# Patient Record
Sex: Female | Born: 1950
Health system: Southern US, Community
[De-identification: ages and names within clinical notes are randomized; demographics above are authoritative.]

## PROBLEM LIST (undated history)

## (undated) ENCOUNTER — Emergency Department (HOSPITAL_BASED_OUTPATIENT_CLINIC_OR_DEPARTMENT_OTHER): Admission: EM | Payer: Medicare Other | Source: Home / Self Care

## (undated) DIAGNOSIS — E079 Disorder of thyroid, unspecified: Secondary | ICD-10-CM

## (undated) DIAGNOSIS — F32A Depression, unspecified: Secondary | ICD-10-CM

## (undated) DIAGNOSIS — I639 Cerebral infarction, unspecified: Secondary | ICD-10-CM

## (undated) DIAGNOSIS — F329 Major depressive disorder, single episode, unspecified: Secondary | ICD-10-CM

## (undated) DIAGNOSIS — I6529 Occlusion and stenosis of unspecified carotid artery: Secondary | ICD-10-CM

## (undated) DIAGNOSIS — S82832A Other fracture of upper and lower end of left fibula, initial encounter for closed fracture: Secondary | ICD-10-CM

## (undated) DIAGNOSIS — T4145XA Adverse effect of unspecified anesthetic, initial encounter: Secondary | ICD-10-CM

## (undated) DIAGNOSIS — I10 Essential (primary) hypertension: Secondary | ICD-10-CM

## (undated) DIAGNOSIS — R519 Headache, unspecified: Secondary | ICD-10-CM

## (undated) DIAGNOSIS — R51 Headache: Secondary | ICD-10-CM

## (undated) DIAGNOSIS — S82899A Other fracture of unspecified lower leg, initial encounter for closed fracture: Secondary | ICD-10-CM

## (undated) DIAGNOSIS — K219 Gastro-esophageal reflux disease without esophagitis: Secondary | ICD-10-CM

## (undated) DIAGNOSIS — J189 Pneumonia, unspecified organism: Secondary | ICD-10-CM

## (undated) DIAGNOSIS — F419 Anxiety disorder, unspecified: Secondary | ICD-10-CM

## (undated) DIAGNOSIS — T8859XA Other complications of anesthesia, initial encounter: Secondary | ICD-10-CM

## (undated) HISTORY — DX: Anxiety disorder, unspecified: F41.9

## (undated) HISTORY — DX: Occlusion and stenosis of unspecified carotid artery: I65.29

## (undated) HISTORY — DX: Headache, unspecified: R51.9

## (undated) HISTORY — DX: Headache: R51

## (undated) HISTORY — PX: ABDOMINAL HYSTERECTOMY: SHX81

## (undated) HISTORY — DX: Major depressive disorder, single episode, unspecified: F32.9

## (undated) HISTORY — DX: Depression, unspecified: F32.A

---

## 1997-08-16 HISTORY — PX: ANTERIOR FIXATION AND POSTERIOR MICRODISCECTOMY CERVICAL SPINE: SHX1164

## 1999-02-25 ENCOUNTER — Encounter: Admission: RE | Admit: 1999-02-25 | Discharge: 1999-05-26 | Payer: Self-pay | Admitting: Endocrinology

## 1999-11-06 ENCOUNTER — Ambulatory Visit (HOSPITAL_COMMUNITY): Admission: RE | Admit: 1999-11-06 | Discharge: 1999-11-06 | Payer: Self-pay | Admitting: *Deleted

## 1999-11-10 ENCOUNTER — Ambulatory Visit (HOSPITAL_COMMUNITY): Admission: RE | Admit: 1999-11-10 | Discharge: 1999-11-10 | Payer: Self-pay | Admitting: *Deleted

## 1999-11-11 ENCOUNTER — Emergency Department (HOSPITAL_COMMUNITY): Admission: EM | Admit: 1999-11-11 | Discharge: 1999-11-11 | Payer: Self-pay | Admitting: Emergency Medicine

## 1999-11-11 ENCOUNTER — Encounter: Payer: Self-pay | Admitting: Emergency Medicine

## 2006-09-20 ENCOUNTER — Ambulatory Visit: Payer: Self-pay | Admitting: Internal Medicine

## 2006-09-21 ENCOUNTER — Ambulatory Visit: Payer: Self-pay | Admitting: *Deleted

## 2006-10-25 ENCOUNTER — Ambulatory Visit: Payer: Self-pay | Admitting: Internal Medicine

## 2006-12-16 ENCOUNTER — Ambulatory Visit: Payer: Self-pay | Admitting: Internal Medicine

## 2007-03-27 ENCOUNTER — Ambulatory Visit: Payer: Self-pay | Admitting: Internal Medicine

## 2007-05-03 ENCOUNTER — Encounter (INDEPENDENT_AMBULATORY_CARE_PROVIDER_SITE_OTHER): Payer: Self-pay | Admitting: *Deleted

## 2007-07-28 ENCOUNTER — Encounter (INDEPENDENT_AMBULATORY_CARE_PROVIDER_SITE_OTHER): Payer: Self-pay | Admitting: Family Medicine

## 2007-07-28 ENCOUNTER — Ambulatory Visit: Payer: Self-pay | Admitting: Internal Medicine

## 2007-07-28 LAB — CONVERTED CEMR LAB
ALT: 12 U/L
AST: 8 U/L
Albumin: 4.5 g/dL
Alkaline Phosphatase: 127 U/L — ABNORMAL HIGH
BUN: 13 mg/dL
CO2: 23 meq/L
Calcium: 9.5 mg/dL
Chloride: 106 meq/L
Cholesterol: 121 mg/dL
Creatinine, Ser: 1.22 mg/dL — ABNORMAL HIGH
Glucose, Bld: 216 mg/dL — ABNORMAL HIGH
HDL: 47 mg/dL
LDL Cholesterol: 47 mg/dL
Potassium: 4.8 meq/L
Sodium: 142 meq/L
TSH: 0.052 u[IU]/mL — ABNORMAL LOW
Total Bilirubin: 0.3 mg/dL
Total CHOL/HDL Ratio: 2.6
Total Protein: 7.1 g/dL
Triglycerides: 133 mg/dL
VLDL: 27 mg/dL

## 2007-08-25 ENCOUNTER — Ambulatory Visit: Payer: Self-pay | Admitting: Internal Medicine

## 2007-09-11 ENCOUNTER — Ambulatory Visit: Payer: Self-pay | Admitting: Internal Medicine

## 2007-09-11 ENCOUNTER — Encounter (INDEPENDENT_AMBULATORY_CARE_PROVIDER_SITE_OTHER): Payer: Self-pay | Admitting: Family Medicine

## 2007-09-11 LAB — CONVERTED CEMR LAB
BUN: 13 mg/dL (ref 6–23)
Basophils Absolute: 0 10*3/uL (ref 0.0–0.1)
Basophils Relative: 0 % (ref 0–1)
CO2: 24 meq/L (ref 19–32)
Calcium: 10 mg/dL (ref 8.4–10.5)
Chloride: 107 meq/L (ref 96–112)
Creatinine, Ser: 1.15 mg/dL (ref 0.40–1.20)
Eosinophils Absolute: 0.3 10*3/uL (ref 0.0–0.7)
Eosinophils Relative: 3 % (ref 0–5)
Glucose, Bld: 124 mg/dL — ABNORMAL HIGH (ref 70–99)
HCT: 38.5 % (ref 36.0–46.0)
Hemoglobin: 12.1 g/dL (ref 12.0–15.0)
Lymphocytes Relative: 43 % (ref 12–46)
Lymphs Abs: 4.3 10*3/uL — ABNORMAL HIGH (ref 0.7–4.0)
MCHC: 31.4 g/dL (ref 30.0–36.0)
MCV: 87.7 fL (ref 78.0–100.0)
Monocytes Absolute: 0.7 10*3/uL (ref 0.1–1.0)
Monocytes Relative: 7 % (ref 3–12)
Neutro Abs: 4.6 10*3/uL (ref 1.7–7.7)
Neutrophils Relative %: 47 % (ref 43–77)
Platelets: 185 10*3/uL (ref 150–400)
Potassium: 4.5 meq/L (ref 3.5–5.3)
RBC: 4.39 M/uL (ref 3.87–5.11)
RDW: 14.1 % (ref 11.5–15.5)
Sodium: 145 meq/L (ref 135–145)
TSH: 0.027 microintl units/mL — ABNORMAL LOW (ref 0.350–5.50)
WBC: 9.9 10*3/uL (ref 4.0–10.5)

## 2007-10-19 ENCOUNTER — Ambulatory Visit: Payer: Self-pay | Admitting: Internal Medicine

## 2007-11-16 ENCOUNTER — Ambulatory Visit: Payer: Self-pay | Admitting: Internal Medicine

## 2007-12-04 ENCOUNTER — Ambulatory Visit: Payer: Self-pay | Admitting: Internal Medicine

## 2007-12-07 ENCOUNTER — Ambulatory Visit: Payer: Self-pay | Admitting: Internal Medicine

## 2007-12-07 LAB — CONVERTED CEMR LAB: TSH: 0.824 microintl units/mL (ref 0.350–5.50)

## 2008-01-25 ENCOUNTER — Ambulatory Visit: Payer: Self-pay | Admitting: Internal Medicine

## 2008-03-21 ENCOUNTER — Ambulatory Visit: Payer: Self-pay | Admitting: Internal Medicine

## 2008-04-04 ENCOUNTER — Ambulatory Visit: Payer: Self-pay | Admitting: Internal Medicine

## 2008-04-04 LAB — CONVERTED CEMR LAB: Microalb, Ur: 1.02 mg/dL (ref 0.00–1.89)

## 2008-04-12 ENCOUNTER — Ambulatory Visit: Payer: Self-pay | Admitting: Internal Medicine

## 2008-05-01 ENCOUNTER — Ambulatory Visit: Payer: Self-pay | Admitting: Internal Medicine

## 2008-06-20 ENCOUNTER — Ambulatory Visit: Payer: Self-pay | Admitting: Internal Medicine

## 2008-07-08 ENCOUNTER — Ambulatory Visit: Payer: Self-pay | Admitting: Family Medicine

## 2008-07-25 ENCOUNTER — Ambulatory Visit: Payer: Self-pay | Admitting: Internal Medicine

## 2008-09-05 ENCOUNTER — Ambulatory Visit: Payer: Self-pay | Admitting: Internal Medicine

## 2008-09-05 LAB — CONVERTED CEMR LAB
ALT: 14 units/L (ref 0–35)
AST: 12 units/L (ref 0–37)
Albumin: 4.4 g/dL (ref 3.5–5.2)
Alkaline Phosphatase: 128 units/L — ABNORMAL HIGH (ref 39–117)
BUN: 14 mg/dL (ref 6–23)
Basophils Absolute: 0 10*3/uL (ref 0.0–0.1)
Basophils Relative: 0 % (ref 0–1)
CO2: 23 meq/L (ref 19–32)
Calcium: 9.7 mg/dL (ref 8.4–10.5)
Chloride: 99 meq/L (ref 96–112)
Cholesterol: 303 mg/dL — ABNORMAL HIGH (ref 0–200)
Creatinine, Ser: 1.13 mg/dL (ref 0.40–1.20)
Eosinophils Absolute: 0.2 10*3/uL (ref 0.0–0.7)
Eosinophils Relative: 2 % (ref 0–5)
Glucose, Bld: 235 mg/dL — ABNORMAL HIGH (ref 70–99)
HCT: 41.3 % (ref 36.0–46.0)
HDL: 62 mg/dL (ref >39)
Hemoglobin: 13.5 g/dL (ref 12.0–15.0)
Lymphocytes Relative: 46 % (ref 12–46)
Lymphs Abs: 4.9 10*3/uL — ABNORMAL HIGH (ref 0.7–4.0)
MCHC: 32.7 g/dL (ref 30.0–36.0)
MCV: 85.3 fL (ref 78.0–100.0)
Monocytes Absolute: 0.7 10*3/uL (ref 0.1–1.0)
Monocytes Relative: 7 % (ref 3–12)
Neutro Abs: 4.7 10*3/uL (ref 1.7–7.7)
Neutrophils Relative %: 45 % (ref 43–77)
Platelets: 234 10*3/uL (ref 150–400)
Potassium: 4.2 meq/L (ref 3.5–5.3)
RBC: 4.84 M/uL (ref 3.87–5.11)
RDW: 13.9 % (ref 11.5–15.5)
Sodium: 139 meq/L (ref 135–145)
TSH: 12.598 microintl units/mL — ABNORMAL HIGH (ref 0.350–4.50)
Total Bilirubin: 0.3 mg/dL (ref 0.3–1.2)
Total CHOL/HDL Ratio: 4.9
Total Protein: 7.4 g/dL (ref 6.0–8.3)
Triglycerides: 535 mg/dL — ABNORMAL HIGH (ref <150)
WBC: 10.5 10*3/uL (ref 4.0–10.5)

## 2008-12-12 ENCOUNTER — Ambulatory Visit: Payer: Self-pay | Admitting: Internal Medicine

## 2009-03-05 ENCOUNTER — Ambulatory Visit: Payer: Self-pay | Admitting: Internal Medicine

## 2009-05-15 ENCOUNTER — Encounter: Admission: RE | Admit: 2009-05-15 | Discharge: 2009-05-15 | Payer: Self-pay | Admitting: Internal Medicine

## 2009-05-22 ENCOUNTER — Encounter: Admission: RE | Admit: 2009-05-22 | Discharge: 2009-05-22 | Payer: Self-pay | Admitting: Internal Medicine

## 2009-07-28 ENCOUNTER — Ambulatory Visit: Payer: Self-pay | Admitting: Internal Medicine

## 2009-07-28 LAB — CONVERTED CEMR LAB
BUN: 12 mg/dL (ref 6–23)
CO2: 23 meq/L (ref 19–32)
Calcium: 9.5 mg/dL (ref 8.4–10.5)
Chloride: 102 meq/L (ref 96–112)
Creatinine, Ser: 1.26 mg/dL — ABNORMAL HIGH (ref 0.40–1.20)
Glucose, Bld: 278 mg/dL — ABNORMAL HIGH (ref 70–99)
Potassium: 3.8 meq/L (ref 3.5–5.3)
Sodium: 140 meq/L (ref 135–145)
TSH: 20.616 microintl units/mL — ABNORMAL HIGH (ref 0.350–4.500)

## 2009-08-17 ENCOUNTER — Inpatient Hospital Stay (HOSPITAL_COMMUNITY): Admission: EM | Admit: 2009-08-17 | Discharge: 2009-08-20 | Payer: Self-pay

## 2009-08-17 ENCOUNTER — Ambulatory Visit: Payer: Self-pay | Admitting: Diagnostic Radiology

## 2009-08-17 ENCOUNTER — Encounter (HOSPITAL_COMMUNITY): Payer: Self-pay | Admitting: Emergency Medicine

## 2009-09-10 ENCOUNTER — Ambulatory Visit: Payer: Self-pay | Admitting: Family Medicine

## 2010-03-04 ENCOUNTER — Ambulatory Visit: Payer: Self-pay | Admitting: Internal Medicine

## 2010-03-04 LAB — CONVERTED CEMR LAB
BUN: 12 mg/dL (ref 6–23)
CO2: 24 meq/L (ref 19–32)
Calcium: 9.8 mg/dL (ref 8.4–10.5)
Chloride: 102 meq/L (ref 96–112)
Cholesterol: 240 mg/dL — ABNORMAL HIGH (ref 0–200)
Creatinine, Ser: 0.96 mg/dL (ref 0.40–1.20)
Direct LDL: 141 mg/dL — ABNORMAL HIGH
Glucose, Bld: 276 mg/dL — ABNORMAL HIGH (ref 70–99)
HDL: 54 mg/dL (ref >39)
LDL Cholesterol: 119 mg/dL — ABNORMAL HIGH (ref 0–99)
Microalb, Ur: 0.5 mg/dL (ref 0.00–1.89)
Potassium: 4 meq/L (ref 3.5–5.3)
Sodium: 139 meq/L (ref 135–145)
TSH: 12.511 microintl units/mL — ABNORMAL HIGH (ref 0.350–4.500)
Total CHOL/HDL Ratio: 4.4
Triglycerides: 336 mg/dL — ABNORMAL HIGH (ref <150)
VLDL: 67 mg/dL — ABNORMAL HIGH (ref 0–40)

## 2010-03-19 ENCOUNTER — Ambulatory Visit: Payer: Self-pay | Admitting: Internal Medicine

## 2010-03-23 ENCOUNTER — Ambulatory Visit: Payer: Self-pay | Admitting: Internal Medicine

## 2010-03-25 ENCOUNTER — Ambulatory Visit: Payer: Self-pay | Admitting: Internal Medicine

## 2010-09-07 ENCOUNTER — Encounter: Payer: Self-pay | Admitting: Internal Medicine

## 2010-09-15 NOTE — Miscellaneous (Signed)
Summary: VIP  Patient: Sonia Skinner Note: All result statuses are Final unless otherwise noted.  Tests: (1) VIP (Medications)   LLIMPORTMEDS              "Result Below..."       RESULT: WELLBUTRIN XL TB24 300 MG*TAKE ONE (1) TABLET BY MOUTH EVERY DAY*09/21/2006*Last Refill: 04/18/2007*[id number]*******   LLIMPORTMEDS              "Result Below..."       RESULT: VYTORIN TABS 10-80 MG*TAKE ONE TABLET BY MOUTH EVERY NIGHT AT BEDTIME*12/01/2006*Last Refill: 05/23/2007*[id number]*******   LLIMPORTMEDS              "Result Below..."       RESULT: *USE AS DIRECTED*02/22/2007*Last Refill: 05/23/2007*[id number]*******   LLIMPORTMEDS              "Result Below..."       RESULT: *USE AS DIRECTED*02/22/2007*Last Refill: [id number]***   LLIMPORTMEDS              "Result Below..."       RESULT: TRAZODONE HCL TABS 100 MG*TAKE TWO (2) TABLETS AT BEDTIME*12/23/2006*Last Refill: 05/23/2007*[id number]*******   LLIMPORTMEDS              "Result Below..."       RESULT: STRATTERA CAPS 60 MG*TAKE ONE (1) CAPSULE EACH DAY WITH 40MG  = 100MG *01/25/2007*Last Refill: 04/18/2007*[id number]*******   LLIMPORTMEDS              "Result Below..."       RESULT: STRATTERA CAPS 40 MG*TAKE ONE (1) CAPSULE EACH DAY WITH 60MG  = 100 MG*01/25/2007*Last Refill: 05/23/2007*[id number]*******   LLIMPORTMEDS              "Result Below..."       RESULT: PROTONIX TBEC 40 MG*TAKE ONE (1) TABLET BY MOUTH EVERY DAY*09/20/2006*Last Refill: 04/18/2007*[id number]*******   LLIMPORTMEDS              "Result Below..."       RESULT: METFORMIN HCL TABS 1000 MG*TAKE ONE (1) TABLET BY MOUTH TWO (2) TIMES DAILY*09/20/2006*Last Refill: 04/18/2007*[id number]*******   LLIMPORTMEDS              "Result Below..."       RESULT: LEVOTHROID TABS 175 MCG*TAKE ONE (1) TABLET BY MOUTH EVERY DAY*09/20/2006*Last Refill: 04/18/2007*[id number]*******   LLIMPORTMEDS              "Result Below..."       RESULT: LANTUS SOLN 100 UNIT/ML*INJECT 50 UNITS UNDER SKIN EACH DAY*12/01/2006*Last Refill:  05/23/2007*[id number]*******   LLIMPORTMEDS              "Result Below..."       RESULT: DIOVAN HCT TABS 160-12.5 MG*TAKE ONE (1) TABLET BY MOUTH EVERY DAY*09/20/2006*Last Refill: 04/18/2007*[id number]*******   LLIMPORTMEDS              "Result Below..."       RESULT: CELEXA TABS 40 MG*TAKE ONE TABLET BY MOUTH EVERY MORNING*12/01/2006*Last Refill: 05/23/2007*[id number]*******   LLIMPORTALLS              "Result Below..."       RESULT: [id number]**  Note: An exclamation mark (!) indicates a result that was not dispersed into the flowsheet. Document Creation Date: 06/15/2007 3:00 PM _______________________________________________________________________  (1) Order result status: Final Collection or observation date-time: 05/03/2007 Requested date-time: 05/03/2007 Receipt date-time:  Reported date-time: 05/03/2007 Referring Physician:   Ordering Physician:   Specimen Source:  Source: Charlyne Mom Order Number:  Lab site:

## 2010-11-01 LAB — COMPREHENSIVE METABOLIC PANEL
ALT: 19 U/L (ref 0–35)
AST: 21 U/L (ref 0–37)
Albumin: 4.8 g/dL (ref 3.5–5.2)
Alkaline Phosphatase: 148 U/L — ABNORMAL HIGH (ref 39–117)
BUN: 14 mg/dL (ref 6–23)
CO2: 29 mEq/L (ref 19–32)
Calcium: 10.1 mg/dL (ref 8.4–10.5)
Chloride: 100 mEq/L (ref 96–112)
Creatinine, Ser: 1.1 mg/dL (ref 0.4–1.2)
GFR calc Af Amer: >60 mL/min (ref >60)
GFR calc non Af Amer: 51 mL/min — ABNORMAL LOW (ref >60)
Glucose, Bld: 138 mg/dL — ABNORMAL HIGH (ref 70–99)
Potassium: 3.8 mEq/L (ref 3.5–5.1)
Sodium: 144 mEq/L (ref 135–145)
Total Bilirubin: 0.5 mg/dL (ref 0.3–1.2)
Total Protein: 8.2 g/dL (ref 6.0–8.3)

## 2010-11-01 LAB — BASIC METABOLIC PANEL
BUN: 4 mg/dL — ABNORMAL LOW (ref 6–23)
BUN: 9 mg/dL (ref 6–23)
CO2: 27 mEq/L (ref 19–32)
CO2: 28 mEq/L (ref 19–32)
Calcium: 8.5 mg/dL (ref 8.4–10.5)
Calcium: 8.8 mg/dL (ref 8.4–10.5)
Chloride: 105 mEq/L (ref 96–112)
Chloride: 106 mEq/L (ref 96–112)
Creatinine, Ser: 1.07 mg/dL (ref 0.4–1.2)
Creatinine, Ser: 1.32 mg/dL — ABNORMAL HIGH (ref 0.4–1.2)
GFR calc Af Amer: 50 mL/min — ABNORMAL LOW (ref >60)
GFR calc Af Amer: >60 mL/min (ref >60)
GFR calc non Af Amer: 41 mL/min — ABNORMAL LOW (ref >60)
GFR calc non Af Amer: 52 mL/min — ABNORMAL LOW (ref >60)
Glucose, Bld: 111 mg/dL — ABNORMAL HIGH (ref 70–99)
Glucose, Bld: 286 mg/dL — ABNORMAL HIGH (ref 70–99)
Potassium: 3.6 mEq/L (ref 3.5–5.1)
Potassium: 4.8 mEq/L (ref 3.5–5.1)
Sodium: 137 mEq/L (ref 135–145)
Sodium: 140 mEq/L (ref 135–145)

## 2010-11-01 LAB — URINALYSIS, ROUTINE W REFLEX MICROSCOPIC
Bilirubin Urine: NEGATIVE
Bilirubin Urine: NEGATIVE
Glucose, UA: NEGATIVE mg/dL
Glucose, UA: NEGATIVE mg/dL
Hgb urine dipstick: NEGATIVE
Ketones, ur: NEGATIVE mg/dL
Ketones, ur: NEGATIVE mg/dL
Nitrite: NEGATIVE
Nitrite: NEGATIVE
Protein, ur: NEGATIVE mg/dL
Protein, ur: NEGATIVE mg/dL
Specific Gravity, Urine: 1.009 (ref 1.005–1.030)
Specific Gravity, Urine: >1.046 — ABNORMAL HIGH (ref 1.005–1.030)
Urobilinogen, UA: 0.2 mg/dL (ref 0.0–1.0)
Urobilinogen, UA: 0.2 mg/dL (ref 0.0–1.0)
pH: 5.5 (ref 5.0–8.0)
pH: 6.5 (ref 5.0–8.0)

## 2010-11-01 LAB — CBC
HCT: 34.5 % — ABNORMAL LOW (ref 36.0–46.0)
HCT: 35.8 % — ABNORMAL LOW (ref 36.0–46.0)
HCT: 42 % (ref 36.0–46.0)
Hemoglobin: 11.4 g/dL — ABNORMAL LOW (ref 12.0–15.0)
Hemoglobin: 11.8 g/dL — ABNORMAL LOW (ref 12.0–15.0)
Hemoglobin: 13.6 g/dL (ref 12.0–15.0)
MCHC: 32.4 g/dL (ref 30.0–36.0)
MCHC: 33 g/dL (ref 30.0–36.0)
MCHC: 33.1 g/dL (ref 30.0–36.0)
MCV: 86.8 fL (ref 78.0–100.0)
MCV: 87.6 fL (ref 78.0–100.0)
MCV: 88.1 fL (ref 78.0–100.0)
Platelets: 174 10*3/uL (ref 150–400)
Platelets: 194 10*3/uL (ref 150–400)
Platelets: 262 10*3/uL (ref 150–400)
RBC: 3.92 MIL/uL (ref 3.87–5.11)
RBC: 4.09 MIL/uL (ref 3.87–5.11)
RBC: 4.84 MIL/uL (ref 3.87–5.11)
RDW: 13.4 % (ref 11.5–15.5)
RDW: 13.8 % (ref 11.5–15.5)
RDW: 13.9 % (ref 11.5–15.5)
WBC: 11.9 10*3/uL — ABNORMAL HIGH (ref 4.0–10.5)
WBC: 15.1 10*3/uL — ABNORMAL HIGH (ref 4.0–10.5)
WBC: 9.5 10*3/uL (ref 4.0–10.5)

## 2010-11-01 LAB — LIPASE, BLOOD: Lipase: 72 U/L (ref 23–300)

## 2010-11-01 LAB — URINE MICROSCOPIC-ADD ON

## 2010-11-01 LAB — DIFFERENTIAL
Basophils Absolute: 0.2 10*3/uL — ABNORMAL HIGH (ref 0.0–0.1)
Basophils Relative: 1 % (ref 0–1)
Eosinophils Absolute: 0.3 10*3/uL (ref 0.0–0.7)
Eosinophils Relative: 2 % (ref 0–5)
Lymphocytes Relative: 30 % (ref 12–46)
Lymphs Abs: 4.5 10*3/uL — ABNORMAL HIGH (ref 0.7–4.0)
Monocytes Absolute: 0.9 10*3/uL (ref 0.1–1.0)
Monocytes Relative: 6 % (ref 3–12)
Neutro Abs: 9.2 10*3/uL — ABNORMAL HIGH (ref 1.7–7.7)
Neutrophils Relative %: 61 % (ref 43–77)

## 2010-11-01 LAB — GLUCOSE, CAPILLARY
Glucose-Capillary: 109 mg/dL — ABNORMAL HIGH (ref 70–99)
Glucose-Capillary: 114 mg/dL — ABNORMAL HIGH (ref 70–99)
Glucose-Capillary: 123 mg/dL — ABNORMAL HIGH (ref 70–99)
Glucose-Capillary: 135 mg/dL — ABNORMAL HIGH (ref 70–99)
Glucose-Capillary: 143 mg/dL — ABNORMAL HIGH (ref 70–99)
Glucose-Capillary: 150 mg/dL — ABNORMAL HIGH (ref 70–99)
Glucose-Capillary: 172 mg/dL — ABNORMAL HIGH (ref 70–99)
Glucose-Capillary: 197 mg/dL — ABNORMAL HIGH (ref 70–99)
Glucose-Capillary: 233 mg/dL — ABNORMAL HIGH (ref 70–99)
Glucose-Capillary: 262 mg/dL — ABNORMAL HIGH (ref 70–99)
Glucose-Capillary: 274 mg/dL — ABNORMAL HIGH (ref 70–99)
Glucose-Capillary: 50 mg/dL — ABNORMAL LOW (ref 70–99)
Glucose-Capillary: 70 mg/dL (ref 70–99)
Glucose-Capillary: 84 mg/dL (ref 70–99)

## 2010-11-01 LAB — URINE CULTURE: Colony Count: 6000

## 2010-11-01 LAB — HEMOGLOBIN A1C
Hgb A1c MFr Bld: 9.2 % — ABNORMAL HIGH (ref 4.6–6.1)
Mean Plasma Glucose: 217 mg/dL

## 2011-01-01 NOTE — Procedures (Signed)
[place]. [facility]  Patient:    Sonia Skinner, Sonia Skinner                          MRN: [id number] Proc. Date: 11/10/99 Adm. Date:  [id number] Attending:  Woody Seller                           Procedure Report  PREOPERATIVE DIAGNOSIS:  Blood in stools.  POSTOPERATIVE DIAGNOSIS:  Grossly normal colonoscopic examination to the cecum.  PROCEDURE PERFORMED:  Colonoscopy.  MEDICATIONS USED:  Demerol 100 mg IV.  Versed 10 mg IV over a 20 minute period f time.  INSTRUMENT USED:  Olympus video pancolonoscope.  ENDOSCOPIST:  Katherina Mires, M.D.  INDICATIONS:  This pleasant 60 year old female was referred to the office because of blood in stools that was present.  She also has a history of abdominal pains  associated with constipation that was noted.  There is no history of any weight  loss or discomforts that was noted.  She takes large amounts of laxatives to try to help with her bowel movements at this time.  Because of the stools and her age, she was brought in for colonoscopic examination.  OBJECTIVE FINDINGS:  She is a pleasant female who appears to be in no distress.  vital signs are stable.  The HEENT examination was anicteric.  Neck was supple.  Lungs were clear.  Heart had a regular rate and rhythm without heaves, thrills,  murmurs or gallop.  The abdomen was soft, there is grossly no tenderness to palpation, no hepatosplenomegaly that was appreciated.  Digital rectal exam was  normal.  The extremities showed no cyanosis, clubbing or edema.  PLAN:  To proceed with the colonoscopic examination.  INFORMED CONSENT:  The patient was advised of the procedure, the indications and the risks involved.  The patient has agreed to have the procedure performed.  A  video was reviewed and the consent form was obtained.  PREOPERATIVE PREPARATION:  The patient was brought to the endoscopy unit where n IV for IV sedating medication was started.  A monitor was  placed on the patient to monitor the patients vital signs and oxygen saturation.  Nasal oxygen at two liters a minute was used and after adequate sedation was performed, the procedure was begun.  BOWEL PREP:  The patient was given GoLYTELY and Reglan as a bowel prep at this time.  The patient tolerated the prep without any complications.  The quality of the prep appeared to be fair.  DESCRIPTION OF PROCEDURE:  The instrument was advanced with the patient lying in the left lateral position to approximately 100 cm in the proximal colon to the cecum.  This was confirmed by palpation, visualization of the ileocecal valve that was noted.  There appeared to be no gross abnormalities such as masses, polyps or stricture  lesions appreciated.  The vascular pattern appeared to be well within normal limits throughout the entire colon.  The mucosal pattern showed no evidence of any granular changes or diverticular changes at this time.  There was no increased tortuosity of the colon.  However, there was increased amounts of stool that was present making the procedure somewhat difficult to traverse.  The patient tolerated the procedure well without any complications.  There was no evidence of any internal hemorrhoids or external hemorrhoids upon exiting from the region.  TREATMENT: 1. Conservative management at this  time. 2. Follow-up in the office. DD:  11/10/99 TD:  11/11/99 Job: [id number]

## 2011-01-01 NOTE — Procedures (Signed)
[place]. [facility]  Patient:    Sonia Skinner, Sonia Skinner                          MRN: [id number] Proc. Date: 11/06/99 Adm. Date:  [id number] Disc. Date: [id number] Attending:  Tor Netters                           Procedure Report  REFERRING PHYSICIAN:  Woody Seller., M.D.  PREOPERATIVE DIAGNOSIS:  Abdominal pain not responding to medical treatment.  POSTOPERATIVE DIAGNOSIS:  Diffuse gastritis present.  PROCEDURE:  Esophagogastroduodenoscopy.  MEDICATIONS: 1. Demerol 30 mg IV over a 10 minute period of time. 2. Versed 6 mg IV over a 10 minute period of time.  INSTRUMENT:  Olympus video panendoscope.  ENDOSCOPIST:  Dr. Everlean Cherry.  INDICATIONS:  This pleasant 60 year old female was brought in for evaluation because of persistent abdominal pain at this time.  She has noted a lot of supraumbilical pains and discomforts that were present.  She was placed on PPI medication which appeared not to be helping her symptoms at this time. Because of the fact that the increase discomfort that was ongoing associated with nausea and severe abdominal pains, the patient was brought in for an endoscopic examination to determine what made be the triggering factor for it at this time.  OBJECTIVE FINDINGS:  GENERAL:  She is a pleasant female in no distress.  VITAL SIGNS:  Stable.  HEENT:  Anicteric.  NECK:  Supple.  LUNGS:  Clear.  HEART:  Regular rate and rhythm without heaves, thrills, murmurs, or gallops.  ABDOMEN:  Soft.  There was positive tenderness to palpation in the supraumbilical node region at this time.  There was no history of any hepatosplenomegaly that was noted.  There was no palpable liver or spleen appreciated.  RECTAL:  Digital rectal examination was deferred.  EXTREMITIES:  No CCE.  PLAN:  Proceed with the endoscopic examination.  INFORMED CONSENT:  The patient was advised of the procedure, indications, and risks involved.  The patient  has agreed to have the procedure performed at this time.  PREOPERATIVE PREPARATION:  The patient was brought to the endoscopy unit on IV.  Sedating medication was started.  A monitor was placed on the patient top monitor the patients vital signs and oxygen saturation.  Nasal oxygen at 2 L per minute was used, and after adequate sedation was performed the procedure was begun.  DESCRIPTION OF PROCEDURE:  The instrument was advanced with the patient laying in the left lateral position via direct technique without difficulty.  The oropharyngeal, epiglottis, vocal cords, and piriform sinuses appeared to be grossly within normal limits.  The esophagus was normal without any evidence of acute inflammation, ulcerations, hiatal hernias, or varices appreciated.  The gastric area showed a normal mucosa lake with evidence of diffuse gastritis present throughout the gastric region that was noted.  The pylorus was normal except for inflammatory changes around the pyloric region.  Upon advancing through the pyloric canal, duodenal bulb and second portion appeared to be within normal limits.  The instrument was retracted back where a retroflex view of the cardia revealed no gross pathology.  The Z-line appeared to be approximately 38 cm distal esophagus.  The instrument was retracted back into the esophagus where there was no evidence of any post endoscopic trauma noted. The instrument was subsequently removed without difficulty with the patient tolerating the  procedure well.  TREATMENT: 1. I am going to start the patient on Aciphex 20 mg on a daily basis. 2. Follow up in the office with me. 3. A biopsy was taken at this time also to evaluate for the CLO study, and we    will await these results at this point.  Depending upon of the response    will determine the course of therapy. DD:  03/10/00 TD:  03/11/00 Job: [id number]

## 2012-04-01 ENCOUNTER — Encounter (HOSPITAL_BASED_OUTPATIENT_CLINIC_OR_DEPARTMENT_OTHER): Payer: Self-pay | Admitting: *Deleted

## 2012-04-01 ENCOUNTER — Emergency Department (HOSPITAL_BASED_OUTPATIENT_CLINIC_OR_DEPARTMENT_OTHER): Payer: No Typology Code available for payment source

## 2012-04-01 ENCOUNTER — Emergency Department (HOSPITAL_BASED_OUTPATIENT_CLINIC_OR_DEPARTMENT_OTHER)
Admission: EM | Admit: 2012-04-01 | Discharge: 2012-04-01 | Disposition: A | Discharge status: Home / Self Care | Payer: No Typology Code available for payment source | Attending: Emergency Medicine | Admitting: Emergency Medicine

## 2012-04-01 DIAGNOSIS — S8000XA Contusion of unspecified knee, initial encounter: Secondary | ICD-10-CM | POA: Insufficient documentation

## 2012-04-01 DIAGNOSIS — S8001XA Contusion of right knee, initial encounter: Secondary | ICD-10-CM

## 2012-04-01 DIAGNOSIS — E079 Disorder of thyroid, unspecified: Secondary | ICD-10-CM | POA: Insufficient documentation

## 2012-04-01 DIAGNOSIS — Z794 Long term (current) use of insulin: Secondary | ICD-10-CM | POA: Insufficient documentation

## 2012-04-01 DIAGNOSIS — Z79899 Other long term (current) drug therapy: Secondary | ICD-10-CM | POA: Insufficient documentation

## 2012-04-01 DIAGNOSIS — F17200 Nicotine dependence, unspecified, uncomplicated: Secondary | ICD-10-CM | POA: Insufficient documentation

## 2012-04-01 DIAGNOSIS — I10 Essential (primary) hypertension: Secondary | ICD-10-CM | POA: Insufficient documentation

## 2012-04-01 DIAGNOSIS — E119 Type 2 diabetes mellitus without complications: Secondary | ICD-10-CM | POA: Insufficient documentation

## 2012-04-01 HISTORY — DX: Disorder of thyroid, unspecified: E07.9

## 2012-04-01 HISTORY — DX: Essential (primary) hypertension: I10

## 2012-04-01 MED ORDER — OXYCODONE-ACETAMINOPHEN 5-325 MG PO TABS
1.0000 | ORAL_TABLET | Freq: Once | ORAL | Status: AC
  Administered 2012-04-01: 1 via ORAL
  Filled 2012-04-01 (×2): qty 1

## 2012-04-01 MED ORDER — IBUPROFEN 800 MG PO TABS: 800.0000 mg | ORAL_TABLET | Freq: Three times a day (TID) | ORAL | Status: AC | PRN

## 2012-04-01 MED ORDER — OXYCODONE-ACETAMINOPHEN 5-325 MG PO TABS: 1.0000 | ORAL_TABLET | Freq: Four times a day (QID) | ORAL | Status: AC | PRN
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
## 2012-04-13 ENCOUNTER — Emergency Department (HOSPITAL_BASED_OUTPATIENT_CLINIC_OR_DEPARTMENT_OTHER): Payer: No Typology Code available for payment source

## 2012-04-13 ENCOUNTER — Encounter (HOSPITAL_BASED_OUTPATIENT_CLINIC_OR_DEPARTMENT_OTHER): Payer: Self-pay | Admitting: *Deleted

## 2012-04-13 ENCOUNTER — Emergency Department (HOSPITAL_BASED_OUTPATIENT_CLINIC_OR_DEPARTMENT_OTHER)
Admission: EM | Admit: 2012-04-13 | Discharge: 2012-04-13 | Disposition: A | Discharge status: Home / Self Care | Payer: No Typology Code available for payment source | Attending: Emergency Medicine | Admitting: Emergency Medicine

## 2012-04-13 DIAGNOSIS — Z79899 Other long term (current) drug therapy: Secondary | ICD-10-CM | POA: Insufficient documentation

## 2012-04-13 DIAGNOSIS — I10 Essential (primary) hypertension: Secondary | ICD-10-CM | POA: Insufficient documentation

## 2012-04-13 DIAGNOSIS — M5430 Sciatica, unspecified side: Secondary | ICD-10-CM

## 2012-04-13 DIAGNOSIS — IMO0002 Reserved for concepts with insufficient information to code with codable children: Secondary | ICD-10-CM | POA: Insufficient documentation

## 2012-04-13 DIAGNOSIS — E079 Disorder of thyroid, unspecified: Secondary | ICD-10-CM | POA: Insufficient documentation

## 2012-04-13 DIAGNOSIS — Z794 Long term (current) use of insulin: Secondary | ICD-10-CM | POA: Insufficient documentation

## 2012-04-13 DIAGNOSIS — M25569 Pain in unspecified knee: Secondary | ICD-10-CM | POA: Insufficient documentation

## 2012-04-13 DIAGNOSIS — F17200 Nicotine dependence, unspecified, uncomplicated: Secondary | ICD-10-CM | POA: Insufficient documentation

## 2012-04-13 DIAGNOSIS — E119 Type 2 diabetes mellitus without complications: Secondary | ICD-10-CM | POA: Insufficient documentation

## 2012-04-13 MED ORDER — OXYCODONE-ACETAMINOPHEN 5-325 MG PO TABS: 1.0000 | ORAL_TABLET | Freq: Four times a day (QID) | ORAL | Status: AC | PRN
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
## 2013-04-20 ENCOUNTER — Ambulatory Visit: Payer: No Typology Code available for payment source | Attending: Internal Medicine | Admitting: Internal Medicine

## 2013-04-20 VITALS — BP 204/85 | HR 93 | Temp 99.2°F | Resp 16 | Ht 62.21 in | Wt 156.0 lb

## 2013-04-20 DIAGNOSIS — IMO0001 Reserved for inherently not codable concepts without codable children: Secondary | ICD-10-CM

## 2013-04-20 DIAGNOSIS — E118 Type 2 diabetes mellitus with unspecified complications: Secondary | ICD-10-CM | POA: Insufficient documentation

## 2013-04-20 DIAGNOSIS — E785 Hyperlipidemia, unspecified: Secondary | ICD-10-CM

## 2013-04-20 DIAGNOSIS — I10 Essential (primary) hypertension: Secondary | ICD-10-CM

## 2013-04-20 DIAGNOSIS — E119 Type 2 diabetes mellitus without complications: Secondary | ICD-10-CM | POA: Insufficient documentation

## 2013-04-20 DIAGNOSIS — E039 Hypothyroidism, unspecified: Secondary | ICD-10-CM

## 2013-04-20 MED ORDER — PANTOPRAZOLE SODIUM 40 MG PO TBEC: 40.0000 mg | DELAYED_RELEASE_TABLET | Freq: Every day | ORAL | Status: DC

## 2013-04-20 MED ORDER — AMLODIPINE BESYLATE 5 MG PO TABS: 5.0000 mg | ORAL_TABLET | Freq: Every day | ORAL | Status: DC

## 2013-04-20 MED ORDER — CLONIDINE HCL 0.1 MG PO TABS
0.1000 mg | ORAL_TABLET | Freq: Once | ORAL | Status: AC
  Administered 2013-04-20: 0.1 mg via ORAL

## 2013-04-20 MED ORDER — LEVOTHYROXINE SODIUM 175 MCG PO TABS: 175.0000 ug | ORAL_TABLET | Freq: Every day | ORAL | Status: DC

## 2013-04-20 MED ORDER — INSULIN GLARGINE 100 UNIT/ML ~~LOC~~ SOLN: 80.0000 [IU] | Freq: Every day | SUBCUTANEOUS | Status: DC

## 2013-04-20 MED ORDER — METFORMIN HCL 1000 MG PO TABS: 1000.0000 mg | ORAL_TABLET | Freq: Two times a day (BID) | ORAL | Status: DC

## 2013-04-20 MED ORDER — VALSARTAN-HYDROCHLOROTHIAZIDE 320-25 MG PO TABS: 1.0000 | ORAL_TABLET | Freq: Every day | ORAL | Status: DC

## 2013-04-20 MED ORDER — BUPROPION HCL ER (XL) 150 MG PO TB24: 150.0000 mg | ORAL_TABLET | Freq: Every day | ORAL | Status: DC

## 2013-04-20 NOTE — Progress Notes (Signed)
Patient ID: Sonia Skinner, female   DOB: Mar 15, 1951, 62 y.o.   MRN: [id number] Patient Demographics  Sonia Skinner, is a 62 y.o. female  [id number]  [id number]  DOB - 1950/09/12  Chief Complaint  Patient presents with  . Establish Care        Subjective:   Sonia Skinner today is here to establish primary care. Patient is a 62 year old female with history of hypertension, diabetes, hypothyroidism, hyperlipidemia presents to establish care. Patient reports that she's been having intermittent headaches every morning, her BP has been running high her blood sugars has been running high. Patient has not had any medications for the last 6 months. She states that she has not seen any doctor [facility]. Her sisters have diabetes, hence she had continued to take her Lantus.  Patient has No chest pain, No abdominal pain - No Nausea, No new weakness tingling or numbness, No Cough - SOB.   Objective:    Filed Vitals:   04/20/13 1657  BP: 204/85  Pulse: 93  Temp: 99.2 F (37.3 C)  TempSrc: Oral  Resp: 16  Height: 5' 2.21" (1.58 m)  Weight: 156 lb (70.761 kg)  SpO2: 95%     ALLERGIES:   Allergies  Allergen Reactions  . Anesthetics, Ester Anaphylaxis    PAST MEDICAL HISTORY: Past Medical History  Diagnosis Date  . Diabetes mellitus   . Hypertension   . Thyroid disease     PAST SURGICAL HISTORY: Past Surgical History  Procedure Laterality Date  . Abdominal hysterectomy      FAMILY HISTORY: Family History  Problem Relation Age of Onset  . Diabetes Mother   . Heart disease Father   . Hypertension Sister   . Diabetes Sister     MEDICATIONS AT HOME: Prior to Admission medications   Medication Sig Start Date End Date Taking? Authorizing Provider  amLODipine (NORVASC) 5 MG tablet Take 1 tablet (5 mg total) by mouth daily. 04/20/13   Sonia Barberi Krystal Eaton, MD  buPROPion (WELLBUTRIN XL) 150 MG 24 hr tablet Take 1 tablet (150 mg total) by mouth daily. 04/20/13    Myer Bohlman Krystal Eaton, MD  ibuprofen (ADVIL,MOTRIN) 800 MG tablet Take 800 mg by mouth every 8 (eight) hours as needed. For pain    Historical Provider, MD  insulin glargine (LANTUS) 100 UNIT/ML injection Inject 0.8 mLs (80 Units total) into the skin at bedtime. 04/20/13   Sonia Mcmains Krystal Eaton, MD  levothyroxine (SYNTHROID, LEVOTHROID) 175 MCG tablet Take 1 tablet (175 mcg total) by mouth daily. 04/20/13   Sonia Urizar Krystal Eaton, MD  metFORMIN (GLUCOPHAGE) 1000 MG tablet Take 1 tablet (1,000 mg total) by mouth 2 (two) times daily with a meal. 04/20/13   Sonia Venezia K Deshaun Schou, MD  pantoprazole (PROTONIX) 40 MG tablet Take 1 tablet (40 mg total) by mouth daily. 04/20/13   Sonia Cranford Krystal Eaton, MD  valsartan-hydrochlorothiazide (DIOVAN-HCT) 320-25 MG per tablet Take 1 tablet by mouth daily. 04/20/13   Sonia Delellis Krystal Eaton, MD    REVIEW OF SYSTEMS:  Constitutional:   No   Fevers, chills, fatigue.  HEENT:    No headaches, Sore throat,   Cardio-vascular: No chest pain,  Orthopnea, swelling in lower extremities, anasarca, palpitations  GI:  No abdominal pain, nausea, vomiting, diarrhea  Resp: No shortness of breath,  No coughing up of blood.No cough.No wheezing.  Skin:  no rash or lesions.  GU:  no dysuria, change in color of urine, no urgency or frequency.  No flank  pain.  Musculoskeletal: No joint pain or swelling.  No decreased range of motion.  No back pain.  Psych: No change in mood or affect. No depression or anxiety.  No memory loss.   Exam  General appearance :Awake, alert, NAD, Speech Clear. HEENT: Atraumatic and Normocephalic, PERLA Neck: supple, no JVD. No cervical lymphadenopathy.  Chest: clear to auscultation bilaterally, no wheezing, rales or rhonchi CVS: S1 S2 regular, no murmurs.  Abdomen: soft, NBS, NT, ND, no gaurding, rigidity or rebound. Extremities: No cyanosis, clubbing, B/L Lower Ext shows no edema,  Neurology: Awake alert, and oriented X 3, CN II-XII intact, Non focal Skin:No Rash or lesions Wounds:  N/A    Data Review   Basic Metabolic Panel: No results found for this basename: NA, K, CL, CO2, GLUCOSE, BUN, CREATININE, CALCIUM, MG, PHOS,  in the last 168 hours Liver Function Tests: No results found for this basename: AST, ALT, ALKPHOS, BILITOT, PROT, ALBUMIN,  in the last 168 hours  CBC: No results found for this basename: WBC, NEUTROABS, HGB, HCT, MCV, PLT,  in the last 168 hours ------------------------------------------------------------------------------------------------------------------ No results found for this basename: HGBA1C,  in the last 72 hours ------------------------------------------------------------------------------------------------------------------ No results found for this basename: CHOL, HDL, LDLCALC, TRIG, CHOLHDL, LDLDIRECT,  in the last 72 hours ------------------------------------------------------------------------------------------------------------------ No results found for this basename: TSH, T4TOTAL, FREET3, T3FREE, THYROIDAB,  in the last 72 hours ------------------------------------------------------------------------------------------------------------------ No results found for this basename: VITAMINB12, FOLATE, FERRITIN, TIBC, IRON, RETICCTPCT,  in the last 72 hours  Coagulation profile  No results found for this basename: INR, PROTIME,  in the last 168 hours    Assessment & Plan   Active Problems: Accelerated hypertension - Will give clonidine 0.2 mg x1 now - Start on valsartan-HCTZ 320/25 mg daily, amlodipine 5 mg daily. She was not on amlodipine before. - Counseled to check her BP every day and make a log  Insulin-dependent diabetes mellitus - Refilled metformin and Lantus - Will check hemoglobin A1c  Hyperlipidemia: Check lipid panel.  Hypothyroidism Verified dose, she was on Synthroid 175 MCG daily, will refill, check TSH  Health maintenance ordered mammogram:    Recommendations:Check CBC, cemented, lipid panel,  hemoglobin A1c, TSH   Follow-up in  3 weeks    Ajani Rineer M.D. 04/20/2013, 5:56 PM

## 2013-04-20 NOTE — Progress Notes (Signed)
Pt is here to establish care. Pt is diabetic and has HTN  She's been without her medications for 6 months here to get her medicines renewed. She was also in an accident AUG, 2013 and hurt her right knee the pain is a 5, sharp, aches, and is a dull pain.

## 2013-04-23 ENCOUNTER — Ambulatory Visit: Payer: No Typology Code available for payment source | Attending: Family Medicine

## 2013-04-23 ENCOUNTER — Other Ambulatory Visit: Payer: Self-pay | Admitting: Internal Medicine

## 2013-04-23 DIAGNOSIS — I10 Essential (primary) hypertension: Secondary | ICD-10-CM

## 2013-04-23 MED ORDER — LISINOPRIL-HYDROCHLOROTHIAZIDE 20-25 MG PO TABS: 1.0000 | ORAL_TABLET | Freq: Every day | ORAL | Status: DC

## 2013-04-24 LAB — CBC WITH DIFFERENTIAL/PLATELET
Basophils Absolute: 0.1 10*3/uL (ref 0.0–0.1)
Basophils Relative: 0 % (ref 0–1)
Eosinophils Absolute: 0.2 10*3/uL (ref 0.0–0.7)
Eosinophils Relative: 1 % (ref 0–5)
HCT: 39.8 % (ref 36.0–46.0)
Hemoglobin: 13 g/dL (ref 12.0–15.0)
Lymphocytes Relative: 36 % (ref 12–46)
Lymphs Abs: 4 10*3/uL (ref 0.7–4.0)
MCH: 26.9 pg (ref 26.0–34.0)
MCHC: 32.7 g/dL (ref 30.0–36.0)
MCV: 82.2 fL (ref 78.0–100.0)
Monocytes Absolute: 0.6 10*3/uL (ref 0.1–1.0)
Monocytes Relative: 5 % (ref 3–12)
Neutro Abs: 6.4 10*3/uL (ref 1.7–7.7)
Neutrophils Relative %: 58 % (ref 43–77)
Platelets: 171 10*3/uL (ref 150–400)
RBC: 4.84 MIL/uL (ref 3.87–5.11)
RDW: 15.1 % (ref 11.5–15.5)
WBC: 11.2 10*3/uL — ABNORMAL HIGH (ref 4.0–10.5)

## 2013-04-24 LAB — HEMOGLOBIN A1C
Hgb A1c MFr Bld: 11.2 % — ABNORMAL HIGH (ref <5.7)
Mean Plasma Glucose: 275 mg/dL — ABNORMAL HIGH (ref <117)

## 2013-04-24 LAB — COMPREHENSIVE METABOLIC PANEL
ALT: 9 U/L (ref 0–35)
AST: 11 U/L (ref 0–37)
Albumin: 4 g/dL (ref 3.5–5.2)
Alkaline Phosphatase: 113 U/L (ref 39–117)
BUN: 10 mg/dL (ref 6–23)
CO2: 24 mEq/L (ref 19–32)
Calcium: 9 mg/dL (ref 8.4–10.5)
Chloride: 105 mEq/L (ref 96–112)
Creat: 0.88 mg/dL (ref 0.50–1.10)
Glucose, Bld: 218 mg/dL — ABNORMAL HIGH (ref 70–99)
Potassium: 3.8 mEq/L (ref 3.5–5.3)
Sodium: 140 mEq/L (ref 135–145)
Total Bilirubin: 0.3 mg/dL (ref 0.3–1.2)
Total Protein: 6.6 g/dL (ref 6.0–8.3)

## 2013-04-24 LAB — LIPID PANEL
Cholesterol: 226 mg/dL — ABNORMAL HIGH (ref 0–200)
HDL: 45 mg/dL (ref >39)
LDL Cholesterol: 141 mg/dL — ABNORMAL HIGH (ref 0–99)
Total CHOL/HDL Ratio: 5 Ratio
Triglycerides: 198 mg/dL — ABNORMAL HIGH (ref <150)
VLDL: 40 mg/dL (ref 0–40)

## 2013-04-24 LAB — TSH: TSH: 1.819 u[IU]/mL (ref 0.350–4.500)

## 2013-05-11 ENCOUNTER — Ambulatory Visit: Payer: No Typology Code available for payment source | Admitting: Family Medicine

## 2013-05-17 ENCOUNTER — Ambulatory Visit: Payer: No Typology Code available for payment source | Attending: Internal Medicine | Admitting: Internal Medicine

## 2013-05-17 ENCOUNTER — Encounter: Payer: Self-pay | Admitting: Internal Medicine

## 2013-05-17 VITALS — BP 164/82 | HR 98 | Temp 98.2°F | Resp 16 | Ht 62.0 in | Wt 150.0 lb

## 2013-05-17 DIAGNOSIS — IMO0001 Reserved for inherently not codable concepts without codable children: Secondary | ICD-10-CM

## 2013-05-17 DIAGNOSIS — E119 Type 2 diabetes mellitus without complications: Secondary | ICD-10-CM | POA: Insufficient documentation

## 2013-05-17 DIAGNOSIS — Z23 Encounter for immunization: Secondary | ICD-10-CM

## 2013-05-17 DIAGNOSIS — I10 Essential (primary) hypertension: Secondary | ICD-10-CM | POA: Insufficient documentation

## 2013-05-17 MED ORDER — AMLODIPINE BESYLATE 10 MG PO TABS: 10.0000 mg | ORAL_TABLET | Freq: Every day | ORAL | Status: DC

## 2013-05-17 MED ORDER — GLIPIZIDE 5 MG PO TABS: 5.0000 mg | ORAL_TABLET | Freq: Two times a day (BID) | ORAL | Status: DC

## 2013-05-17 MED ORDER — GLIPIZIDE 10 MG PO TABS: 10.0000 mg | ORAL_TABLET | Freq: Two times a day (BID) | ORAL | Status: DC

## 2013-05-17 NOTE — Progress Notes (Signed)
Pt is here for a F/U visit Pt is here to review her lab results

## 2013-05-17 NOTE — Progress Notes (Signed)
Patient ID: Sonia Skinner, female   DOB: 05/22/1951, 62 y.o.   MRN: [id number] Patient Demographics  Sonia Skinner, is a 62 y.o. female  [id number]  [id number]  DOB - [birth year]  Chief Complaint  Patient presents with  . Follow-up        Subjective:   Sonia Skinner today is here for a follow up visit. Patient is a 62 year old female with history of hypertension, diabetes mellitus, hyperlipidemia here for follow-up. She states that she is having diarrhea with metformin. She works as a Oceanographer and cannot leave the class. Hence, she started taking metformin only on the weekends.  BP still is high at 164/82, reviewed the log, most of the SBP readings are above 150  Patient has No headache, No chest pain, No abdominal pain - No Nausea, No new weakness tingling or numbness, No Cough - SOB.   Objective:    Filed Vitals:   05/17/13 1704  BP: 164/82  Pulse: 98  Temp: 98.2 F (36.8 C)  TempSrc: Oral  Resp: 16  Height: 5\' 2"  (1.575 m)  Weight: 150 lb (68.04 kg)  SpO2: 100%     ALLERGIES:   Allergies  Allergen Reactions  . Anesthetics, Ester Anaphylaxis    PAST MEDICAL HISTORY: Past Medical History  Diagnosis Date  . Diabetes mellitus   . Hypertension   . Thyroid disease     MEDICATIONS AT HOME: Prior to Admission medications   Medication Sig Start Date End Date Taking? Authorizing Provider  amLODipine (NORVASC) 10 MG tablet Take 1 tablet (10 mg total) by mouth daily. 05/17/13   Ripudeep Krystal Eaton, MD  buPROPion (WELLBUTRIN XL) 150 MG 24 hr tablet Take 1 tablet (150 mg total) by mouth daily. 04/20/13   Ripudeep Krystal Eaton, MD  glipiZIDE (GLUCOTROL) 10 MG tablet Take 1 tablet (10 mg total) by mouth 2 (two) times daily before a meal. 05/17/13   Ripudeep K Rai, MD  ibuprofen (ADVIL,MOTRIN) 800 MG tablet Take 800 mg by mouth every 8 (eight) hours as needed. For pain    Historical Provider, MD  insulin glargine (LANTUS) 100 UNIT/ML injection Inject 0.8 mLs (80 Units total) into  the skin at bedtime. 04/20/13   Ripudeep Krystal Eaton, MD  levothyroxine (SYNTHROID, LEVOTHROID) 175 MCG tablet Take 1 tablet (175 mcg total) by mouth daily. 04/20/13   Ripudeep Krystal Eaton, MD  lisinopril-hydrochlorothiazide (PRINZIDE,ZESTORETIC) 20-25 MG per tablet Take 1 tablet by mouth daily. 04/23/13   Angelica Chessman, MD  pantoprazole (PROTONIX) 40 MG tablet Take 1 tablet (40 mg total) by mouth daily. 04/20/13   Ripudeep Krystal Eaton, MD     Exam  General appearance :Awake, alert, NAD, Speech Clear.  HEENT: Atraumatic and Normocephalic, PERLA Neck: supple, no JVD. No cervical lymphadenopathy.  Chest: Clear to auscultation bilaterally, no wheezing, rales or rhonchi CVS: S1 S2 regular, no murmurs.  Abdomen: soft, NBS, NT, ND, no gaurding, rigidity or rebound. Extremities: no cyanosis or clubbing, B/L Lower Ext shows no edema Neurology: Awake alert, and oriented X 3, CN II-XII intact, Non focal Skin: No Rash or lesions Wounds:N/A    Data Review   Basic Metabolic Panel: No results found for this basename: NA, K, CL, CO2, GLUCOSE, BUN, CREATININE, CALCIUM, MG, PHOS,  in the last 168 hours Liver Function Tests: No results found for this basename: AST, ALT, ALKPHOS, BILITOT, PROT, ALBUMIN,  in the last 168 hours  CBC: No results found for this basename: WBC, NEUTROABS, HGB, HCT, MCV, PLT,  in  the last 168 hours  ------------------------------------------------------------------------------------------------------------------ No results found for this basename: HGBA1C,  in the last 72 hours ------------------------------------------------------------------------------------------------------------------ No results found for this basename: CHOL, HDL, LDLCALC, TRIG, CHOLHDL, LDLDIRECT,  in the last 72 hours ------------------------------------------------------------------------------------------------------------------ No results found for this basename: TSH, T4TOTAL, FREET3, T3FREE, THYROIDAB,  in the last  72 hours ------------------------------------------------------------------------------------------------------------------ No results found for this basename: VITAMINB12, FOLATE, FERRITIN, TIBC, IRON, RETICCTPCT,  in the last 72 hours  Coagulation profile  No results found for this basename: INR, PROTIME,  in the last 168 hours    Assessment & Plan   Active Problems: Diabetes mellitus - Continue Lantus, spot CBG checked was 254 - DC metformin as patient has not been taking it anyway due to GI upset and diarrhea - Placed on glipizide 5mg  BID with meals  Hypertension: Still uncontrolled - Increase Norvasc to 10 mg daily, continue lisinopril/HCTZ 20/25 mg daily   Health screening - Will order screening mammogram - Flu shot today   Follow-up in2 months     RAI,RIPUDEEP M.D. 05/17/2013, 5:06 PM

## 2013-05-21 ENCOUNTER — Other Ambulatory Visit: Payer: Self-pay | Admitting: Internal Medicine

## 2013-05-21 MED ORDER — INSULIN GLARGINE 100 UNIT/ML ~~LOC~~ SOLN: 80.0000 [IU] | Freq: Every day | SUBCUTANEOUS | Status: DC

## 2013-05-30 ENCOUNTER — Other Ambulatory Visit: Payer: Self-pay | Admitting: Internal Medicine

## 2013-05-30 MED ORDER — INSULIN GLARGINE 100 UNIT/ML ~~LOC~~ SOLN: 80.0000 [IU] | Freq: Every day | SUBCUTANEOUS | Status: DC

## 2013-07-17 ENCOUNTER — Encounter: Payer: Self-pay | Admitting: Internal Medicine

## 2013-07-17 ENCOUNTER — Ambulatory Visit: Payer: No Typology Code available for payment source | Attending: Internal Medicine | Admitting: Internal Medicine

## 2013-07-17 VITALS — BP 139/75 | HR 70 | Temp 98.5°F | Resp 16 | Wt 145.2 lb

## 2013-07-17 DIAGNOSIS — H6122 Impacted cerumen, left ear: Secondary | ICD-10-CM

## 2013-07-17 DIAGNOSIS — E119 Type 2 diabetes mellitus without complications: Secondary | ICD-10-CM

## 2013-07-17 DIAGNOSIS — Z139 Encounter for screening, unspecified: Secondary | ICD-10-CM

## 2013-07-17 DIAGNOSIS — E039 Hypothyroidism, unspecified: Secondary | ICD-10-CM

## 2013-07-17 DIAGNOSIS — E785 Hyperlipidemia, unspecified: Secondary | ICD-10-CM

## 2013-07-17 DIAGNOSIS — H6120 Impacted cerumen, unspecified ear: Secondary | ICD-10-CM

## 2013-07-17 DIAGNOSIS — I10 Essential (primary) hypertension: Secondary | ICD-10-CM

## 2013-07-17 LAB — GLUCOSE, POCT (MANUAL RESULT ENTRY): POC Glucose: 399 mg/dl — AB (ref 70–99)

## 2013-07-17 LAB — POCT GLYCOSYLATED HEMOGLOBIN (HGB A1C): Hemoglobin A1C: 9.9

## 2013-07-17 MED ORDER — CARBAMIDE PEROXIDE 6.5 % OT SOLN: 5.0000 [drp] | Freq: Two times a day (BID) | OTIC | Status: DC

## 2013-07-17 MED ORDER — GLIPIZIDE 10 MG PO TABS: 10.0000 mg | ORAL_TABLET | Freq: Two times a day (BID) | ORAL | Status: DC

## 2013-07-17 NOTE — Progress Notes (Signed)
MRN: [id number] Name: Sonia Skinner  Sex: female Age: 62 y.o. DOB: 1950/11/05  Allergies: Anesthetics, ester  Chief Complaint  Patient presents with  . Diabetes    HPI: Patient is 62 y.o. female who has history of diabetes hypertension, comes today for follow up, on the last visit she was started on glipizide as per patient she has been compliant with her medication her blood sugar is improved from before, today hemoglobin A1c is 9.9% which is better than before, she denies any acute symptoms. She denies any hypoglycemic symptoms.  Past Medical History  Diagnosis Date  . Diabetes mellitus   . Hypertension   . Thyroid disease     Past Surgical History  Procedure Laterality Date  . Abdominal hysterectomy        Medication List       This list is accurate as of: 07/17/13  6:02 PM.  Always use your most recent med list.               amLODipine 10 MG tablet  Commonly known as:  NORVASC  Take 1 tablet (10 mg total) by mouth daily.     buPROPion 150 MG 24 hr tablet  Commonly known as:  WELLBUTRIN XL  Take 1 tablet (150 mg total) by mouth daily.     carbamide peroxide 6.5 % otic solution  Commonly known as:  DEBROX  Place 5 drops into the left ear 2 (two) times daily.     glipiZIDE 10 MG tablet  Commonly known as:  GLUCOTROL  Take 1 tablet (10 mg total) by mouth 2 (two) times daily before a meal.     ibuprofen 800 MG tablet  Commonly known as:  ADVIL,MOTRIN  Take 800 mg by mouth every 8 (eight) hours as needed. For pain     insulin glargine 100 UNIT/ML injection  Commonly known as:  LANTUS  Inject 0.8 mLs (80 Units total) into the skin at bedtime.     levothyroxine 175 MCG tablet  Commonly known as:  SYNTHROID, LEVOTHROID  Take 1 tablet (175 mcg total) by mouth daily.     lisinopril-hydrochlorothiazide 20-25 MG per tablet  Commonly known as:  PRINZIDE,ZESTORETIC  Take 1 tablet by mouth daily.     pantoprazole 40 MG tablet  Commonly known as:  PROTONIX  Take  1 tablet (40 mg total) by mouth daily.        Meds ordered this encounter  Medications  . glipiZIDE (GLUCOTROL) 10 MG tablet    Sig: Take 1 tablet (10 mg total) by mouth 2 (two) times daily before a meal.    Dispense:  60 tablet    Refill:  3  . carbamide peroxide (DEBROX) 6.5 % otic solution    Sig: Place 5 drops into the left ear 2 (two) times daily.    Dispense:  15 mL    Refill:  1    Immunization History  Administered Date(s) Administered  . Influenza Split 05/17/2013    History  Substance Use Topics  . Smoking status: Current Every Day Smoker -- 0.20 packs/day    Types: Cigarettes  . Smokeless tobacco: Not on file  . Alcohol Use: No    Review of Systems  As noted in HPI  Filed Vitals:   07/17/13 1646  BP: 139/75  Pulse: 70  Temp: 98.5 F (36.9 C)  Resp: 16    Physical Exam  Physical Exam  Constitutional: No distress.  HENT:  Increased wax in the left  auditory canal  Eyes: EOM are normal. Pupils are equal, round, and reactive to light.  Cardiovascular: Normal rate and regular rhythm.   Pulmonary/Chest: Breath sounds normal. No respiratory distress. She has no wheezes. She has no rales.    CBC    Component Value Date/Time   WBC 11.2* 04/20/2013 1801   RBC 4.84 04/20/2013 1801   HGB 13.0 04/20/2013 1801   HCT 39.8 04/20/2013 1801   PLT 171 04/20/2013 1801   MCV 82.2 04/20/2013 1801   LYMPHSABS 4.0 04/20/2013 1801   MONOABS 0.6 04/20/2013 1801   EOSABS 0.2 04/20/2013 1801   BASOSABS 0.1 04/20/2013 1801    CMP     Component Value Date/Time   NA 140 04/20/2013 1801   K 3.8 04/20/2013 1801   CL 105 04/20/2013 1801   CO2 24 04/20/2013 1801   GLUCOSE 218* 04/20/2013 1801   BUN 10 04/20/2013 1801   CREATININE 0.88 04/20/2013 1801   CREATININE 0.96 03/04/2010 2002   CALCIUM 9.0 04/20/2013 1801   PROT 6.6 04/20/2013 1801   ALBUMIN 4.0 04/20/2013 1801   AST 11 04/20/2013 1801   ALT 9 04/20/2013 1801   ALKPHOS 113 04/20/2013 1801   BILITOT 0.3 04/20/2013 1801   GFRNONAA 52* 08/19/2009  0550   GFRAA  Value: >60        The eGFR has been calculated using the MDRD equation. This calculation has not been validated in all clinical situations. eGFR's persistently <60 mL/min signify possible Chronic Kidney Disease. 08/19/2009 0550    Lab Results  Component Value Date/Time   CHOL 226* 04/20/2013  6:01 PM    No components found with this basename: hga1c    Lab Results  Component Value Date/Time   AST 11 04/20/2013  6:01 PM    Assessment and Plan  DM (diabetes mellitus) - Plan: Glucose (CBG), HgB A1c 9.9%, I have increased glipiZIDE (GLUCOTROL) 10 MG tablet twice a day  Essential hypertension, benign... continue with current medications advise   for low salt diet. Other and unspecified hyperlipidemia  Unspecified hypothyroidism - Plan: TSH  Screening - Plan: CBC with Differential, COMPLETE METABOLIC PANEL WITH GFR, Lipid panel, Vit D  25 hydroxy (rtn osteoporosis monitoring)  Excess ear wax, left - Plan: carbamide peroxide (DEBROX) 6.5 % otic solution    Return in about 3 months (around 10/15/2013).  Lorayne Marek, MD

## 2013-07-17 NOTE — Progress Notes (Signed)
Patient here for follow up DM Patient states used to take wellbutrin 300mg  BID The prescription on file does not reflect that She would like to go back to way she used to take it

## 2013-07-18 ENCOUNTER — Encounter: Payer: Self-pay | Admitting: Internal Medicine

## 2013-07-18 ENCOUNTER — Other Ambulatory Visit: Payer: Self-pay | Admitting: Internal Medicine

## 2013-07-18 MED ORDER — INSULIN GLARGINE 100 UNIT/ML ~~LOC~~ SOLN: 80.0000 [IU] | Freq: Every day | SUBCUTANEOUS | Status: DC

## 2013-07-31 ENCOUNTER — Ambulatory Visit: Payer: No Typology Code available for payment source | Attending: Internal Medicine | Admitting: Family Medicine

## 2013-07-31 ENCOUNTER — Encounter: Payer: Self-pay | Admitting: Family Medicine

## 2013-07-31 VITALS — BP 145/80 | HR 92 | Temp 98.9°F | Resp 16 | Ht 62.0 in | Wt 145.0 lb

## 2013-07-31 DIAGNOSIS — K089 Disorder of teeth and supporting structures, unspecified: Secondary | ICD-10-CM | POA: Insufficient documentation

## 2013-07-31 DIAGNOSIS — K0889 Other specified disorders of teeth and supporting structures: Secondary | ICD-10-CM | POA: Insufficient documentation

## 2013-07-31 DIAGNOSIS — Z720 Tobacco use: Secondary | ICD-10-CM | POA: Insufficient documentation

## 2013-07-31 DIAGNOSIS — E119 Type 2 diabetes mellitus without complications: Secondary | ICD-10-CM

## 2013-07-31 DIAGNOSIS — F17200 Nicotine dependence, unspecified, uncomplicated: Secondary | ICD-10-CM

## 2013-07-31 LAB — GLUCOSE, POCT (MANUAL RESULT ENTRY): POC Glucose: 234 mg/dl — AB (ref 70–99)

## 2013-07-31 MED ORDER — PENICILLIN V POTASSIUM 500 MG PO TABS: 500.0000 mg | ORAL_TABLET | Freq: Four times a day (QID) | ORAL | Status: DC

## 2013-07-31 MED ORDER — BUPROPION HCL ER (XL) 150 MG PO TB24: ORAL_TABLET | ORAL | Status: DC

## 2013-07-31 MED ORDER — TRAMADOL HCL 50 MG PO TABS: ORAL_TABLET | ORAL | Status: DC

## 2013-07-31 NOTE — Progress Notes (Signed)
Pt is here today with an abscess on her lower left gum. Pt is requesting medication to help quit smoking.

## 2013-07-31 NOTE — Patient Instructions (Signed)
Good to see you today!  Thanks for coming in.  Take the pencillin 4 x a day every day  If you get fever or vomiting or feel really ill go to the ER  Take the wellbutrin for 2 months then cut down or stop all cigarettes

## 2013-07-31 NOTE — Assessment & Plan Note (Signed)
Increase dose of wellbutrin to maximum and counseled on cessation approaches

## 2013-07-31 NOTE — Progress Notes (Signed)
   Subjective:    Patient ID: Sonia Skinner, female    DOB: Aug 19, 1950, 62 y.o.   MRN: [id number]  HPI  Left Tooth Pain For last few days.  Has apt with Dentist on Jan 15.  Noticed discharge in her mouth this am.  No fever or nausea or vomiting.  Has been taking ibuprofen 800 mg three times daily for pain.  No allergies  Smoking - would like to go up on her wellbutrin - used to take 600 mg daily which helped her stop smoking.    Review of Symptoms - see HPI  PMH - Smoking status noted.     Review of Systems     Objective:   Physical Exam  Alert no acute distress Face - mild swelling mid left mandible externally.  No cervical adenopathy.  Handling secretions well Generally poor dentition with decayed next to last mandible molar Able to open jaw well Throat: normal mucosa, no exudate, uvula midline, no redness       Assessment & Plan:

## 2013-07-31 NOTE — Assessment & Plan Note (Signed)
Appears to be abscess that is draining wihtou systemic symptoms or signs  Treat with antibiotics and follow up with dentistry

## 2013-08-01 ENCOUNTER — Other Ambulatory Visit: Payer: Self-pay | Admitting: Emergency Medicine

## 2013-08-01 MED ORDER — INSULIN SYRINGES (DISPOSABLE) U-100 1 ML MISC: Status: DC

## 2013-08-20 ENCOUNTER — Other Ambulatory Visit: Payer: Self-pay | Admitting: Internal Medicine

## 2013-08-27 ENCOUNTER — Other Ambulatory Visit: Payer: Self-pay | Admitting: Internal Medicine

## 2013-08-29 ENCOUNTER — Other Ambulatory Visit: Payer: Self-pay | Admitting: Emergency Medicine

## 2013-08-29 ENCOUNTER — Telehealth: Payer: Self-pay | Admitting: Emergency Medicine

## 2013-08-29 MED ORDER — LEVOTHYROXINE SODIUM 175 MCG PO TABS: 175.0000 ug | ORAL_TABLET | Freq: Every day | ORAL | Status: DC

## 2013-10-14 DIAGNOSIS — I639 Cerebral infarction, unspecified: Secondary | ICD-10-CM

## 2013-10-14 HISTORY — DX: Cerebral infarction, unspecified: I63.9

## 2013-10-15 ENCOUNTER — Ambulatory Visit: Payer: No Typology Code available for payment source | Attending: Internal Medicine

## 2013-10-15 DIAGNOSIS — E039 Hypothyroidism, unspecified: Secondary | ICD-10-CM

## 2013-10-15 DIAGNOSIS — Z139 Encounter for screening, unspecified: Secondary | ICD-10-CM

## 2013-10-15 LAB — COMPLETE METABOLIC PANEL WITH GFR
ALT: 10 U/L (ref 0–35)
AST: 12 U/L (ref 0–37)
Albumin: 4.2 g/dL (ref 3.5–5.2)
Alkaline Phosphatase: 124 U/L — ABNORMAL HIGH (ref 39–117)
BUN: 12 mg/dL (ref 6–23)
CO2: 23 mEq/L (ref 19–32)
Calcium: 9.7 mg/dL (ref 8.4–10.5)
Chloride: 103 mEq/L (ref 96–112)
Creat: 1.07 mg/dL (ref 0.50–1.10)
GFR, Est African American: 63 mL/min
GFR, Est Non African American: 55 mL/min — ABNORMAL LOW
Glucose, Bld: 136 mg/dL — ABNORMAL HIGH (ref 70–99)
Potassium: 4 mEq/L (ref 3.5–5.3)
Sodium: 140 mEq/L (ref 135–145)
Total Bilirubin: 0.3 mg/dL (ref 0.2–1.2)
Total Protein: 6.5 g/dL (ref 6.0–8.3)

## 2013-10-15 LAB — CBC WITH DIFFERENTIAL/PLATELET
Basophils Absolute: 0 10*3/uL (ref 0.0–0.1)
Basophils Relative: 0 % (ref 0–1)
Eosinophils Absolute: 0.2 10*3/uL (ref 0.0–0.7)
Eosinophils Relative: 2 % (ref 0–5)
HCT: 37.4 % (ref 36.0–46.0)
Hemoglobin: 12.3 g/dL (ref 12.0–15.0)
Lymphocytes Relative: 42 % (ref 12–46)
Lymphs Abs: 4.5 10*3/uL — ABNORMAL HIGH (ref 0.7–4.0)
MCH: 26.9 pg (ref 26.0–34.0)
MCHC: 32.9 g/dL (ref 30.0–36.0)
MCV: 81.8 fL (ref 78.0–100.0)
Monocytes Absolute: 0.7 10*3/uL (ref 0.1–1.0)
Monocytes Relative: 7 % (ref 3–12)
Neutro Abs: 5.2 10*3/uL (ref 1.7–7.7)
Neutrophils Relative %: 49 % (ref 43–77)
Platelets: 219 10*3/uL (ref 150–400)
RBC: 4.57 MIL/uL (ref 3.87–5.11)
RDW: 14.5 % (ref 11.5–15.5)
WBC: 10.7 10*3/uL — ABNORMAL HIGH (ref 4.0–10.5)

## 2013-10-15 LAB — LIPID PANEL
Cholesterol: 204 mg/dL — ABNORMAL HIGH (ref 0–200)
HDL: 40 mg/dL (ref >39)
LDL Cholesterol: 129 mg/dL — ABNORMAL HIGH (ref 0–99)
Total CHOL/HDL Ratio: 5.1 Ratio
Triglycerides: 174 mg/dL — ABNORMAL HIGH (ref <150)
VLDL: 35 mg/dL (ref 0–40)

## 2013-10-15 LAB — TSH: TSH: 0.029 u[IU]/mL — ABNORMAL LOW (ref 0.350–4.500)

## 2013-10-16 ENCOUNTER — Telehealth: Payer: Self-pay

## 2013-10-16 LAB — VITAMIN D 25 HYDROXY (VIT D DEFICIENCY, FRACTURES): Vit D, 25-Hydroxy: 19 ng/mL — ABNORMAL LOW (ref 30–89)

## 2013-10-16 NOTE — Telephone Encounter (Signed)
Left voice mail to have patient return our phone call

## 2013-10-16 NOTE — Telephone Encounter (Signed)
Message copied by Dorothe Pea on Tue Oct 16, 2013 12:43 PM ------      Message from: Lorayne Marek      Created: Tue Oct 16, 2013 10:43 AM       Blood work reviewed, noticed low vitamin D, call patient advise to start ergocalciferol 50,000 units once a week for the duration of  12 weeks.      Also noticed abnormaly suppressed TSH level, advise patient to reduce the dose of levothyroxine to 150 mcg daily, will repeat TSH in 2 months. ------

## 2013-10-19 ENCOUNTER — Ambulatory Visit: Payer: No Typology Code available for payment source | Attending: Internal Medicine | Admitting: Internal Medicine

## 2013-10-19 ENCOUNTER — Encounter: Payer: Self-pay | Admitting: Internal Medicine

## 2013-10-19 VITALS — BP 153/66 | HR 74 | Temp 98.1°F | Resp 16 | Wt 146.8 lb

## 2013-10-19 DIAGNOSIS — I10 Essential (primary) hypertension: Secondary | ICD-10-CM

## 2013-10-19 DIAGNOSIS — K089 Disorder of teeth and supporting structures, unspecified: Secondary | ICD-10-CM

## 2013-10-19 DIAGNOSIS — K0889 Other specified disorders of teeth and supporting structures: Secondary | ICD-10-CM

## 2013-10-19 DIAGNOSIS — E119 Type 2 diabetes mellitus without complications: Secondary | ICD-10-CM

## 2013-10-19 DIAGNOSIS — E039 Hypothyroidism, unspecified: Secondary | ICD-10-CM

## 2013-10-19 DIAGNOSIS — E559 Vitamin D deficiency, unspecified: Secondary | ICD-10-CM

## 2013-10-19 LAB — POCT GLYCOSYLATED HEMOGLOBIN (HGB A1C): Hemoglobin A1C: 10.1

## 2013-10-19 MED ORDER — LEVOTHYROXINE SODIUM 150 MCG PO TABS: 150.0000 ug | ORAL_TABLET | Freq: Every day | ORAL | Status: DC

## 2013-10-19 MED ORDER — AMOXICILLIN-POT CLAVULANATE 875-125 MG PO TABS: 1.0000 | ORAL_TABLET | Freq: Two times a day (BID) | ORAL | Status: DC

## 2013-10-19 MED ORDER — GLIPIZIDE 10 MG PO TABS: 20.0000 mg | ORAL_TABLET | Freq: Two times a day (BID) | ORAL | Status: DC

## 2013-10-19 NOTE — Patient Instructions (Signed)
DASH Diet  The DASH diet stands for "Dietary Approaches to Stop Hypertension." It is a healthy eating plan that has been shown to reduce high blood pressure (hypertension) in as little as 14 days, while also possibly providing other significant health benefits. These other health benefits include reducing the risk of breast cancer after menopause and reducing the risk of type 2 diabetes, heart disease, colon cancer, and stroke. Health benefits also include weight loss and slowing kidney failure in patients with chronic kidney disease.   DIET GUIDELINES  · Limit salt (sodium). Your diet should contain less than 1500 mg of sodium daily.  · Limit refined or processed carbohydrates. Your diet should include mostly whole grains. Desserts and added sugars should be used sparingly.  · Include small amounts of heart-healthy fats. These types of fats include nuts, oils, and tub margarine. Limit saturated and trans fats. These fats have been shown to be harmful in the body.  CHOOSING FOODS   The following food groups are based on a 2000 calorie diet. See your Registered Dietitian for individual calorie needs.  Grains and Grain Products (6 to 8 servings daily)  · Eat More Often: Whole-wheat bread, brown rice, whole-grain or wheat pasta, quinoa, popcorn without added fat or salt (air popped).  · Eat Less Often: White bread, white pasta, white rice, cornbread.  Vegetables (4 to 5 servings daily)  · Eat More Often: Fresh, frozen, and canned vegetables. Vegetables may be raw, steamed, roasted, or grilled with a minimal amount of fat.  · Eat Less Often/Avoid: Creamed or fried vegetables. Vegetables in a cheese sauce.  Fruit (4 to 5 servings daily)  · Eat More Often: All fresh, canned (in natural juice), or frozen fruits. Dried fruits without added sugar. One hundred percent fruit juice (½ cup [237 mL] daily).  · Eat Less Often: Dried fruits with added sugar. Canned fruit in light or heavy syrup.  Lean Meats, Fish, and Poultry (2  servings or less daily. One serving is 3 to 4 oz [85-114 g]).  · Eat More Often: Ninety percent or leaner ground beef, tenderloin, sirloin. Round cuts of beef, chicken breast, turkey breast. All fish. Grill, bake, or broil your meat. Nothing should be fried.  · Eat Less Often/Avoid: Fatty cuts of meat, turkey, or chicken leg, thigh, or wing. Fried cuts of meat or fish.  Dairy (2 to 3 servings)  · Eat More Often: Low-fat or fat-free milk, low-fat plain or light yogurt, reduced-fat or part-skim cheese.  · Eat Less Often/Avoid: Milk (whole, 2%). Whole milk yogurt. Full-fat cheeses.  Nuts, Seeds, and Legumes (4 to 5 servings per week)  · Eat More Often: All without added salt.  · Eat Less Often/Avoid: Salted nuts and seeds, canned beans with added salt.  Fats and Sweets (limited)  · Eat More Often: Vegetable oils, tub margarines without trans fats, sugar-free gelatin. Mayonnaise and salad dressings.  · Eat Less Often/Avoid: Coconut oils, palm oils, butter, stick margarine, cream, half and half, cookies, candy, pie.  FOR MORE INFORMATION  The Dash Diet Eating Plan: www.dashdiet.org  Document Released: 07/22/2011 Document Revised: 10/25/2011 Document Reviewed: 07/22/2011  [employer]® Patient Information ©2014 [employer].

## 2013-10-19 NOTE — Progress Notes (Signed)
MRN: [id number] Name: Sonia Skinner  Sex: female Age: 63 y.o. DOB: 06-14-1951  Allergies: Anesthetics, ester  Chief Complaint  Patient presents with  . Follow-up    HPI: Patient is 63 y.o. female who has to of diabetes hypertension hypothyroidism comes today for followup, she is history of dental pain and was following up with the dentist, recently complaining of dental pain denies any fever chills, her diabetes is uncontrolled patient is taking Glucotrol 10 mg twice a day as well as Lantus 80 units each bedtime, she denies any hypoglycemic symptoms today her hemoglobin A1c is 10.1% which has trended up to.  Past Medical History  Diagnosis Date  . Diabetes mellitus   . Hypertension   . Thyroid disease     Past Surgical History  Procedure Laterality Date  . Abdominal hysterectomy        Medication List       This list is accurate as of: 10/19/13  3:53 PM.  Always use your most recent med list.               amLODipine 10 MG tablet  Commonly known as:  NORVASC  Take 1 tablet (10 mg total) by mouth daily.     amoxicillin-clavulanate 875-125 MG per tablet  Commonly known as:  AUGMENTIN  Take 1 tablet by mouth 2 (two) times daily.     buPROPion 150 MG 24 hr tablet  Commonly known as:  WELLBUTRIN XL  2 tabs in AM and 1 in PM     carbamide peroxide 6.5 % otic solution  Commonly known as:  DEBROX  Place 5 drops into the left ear 2 (two) times daily.     glipiZIDE 10 MG tablet  Commonly known as:  GLUCOTROL  Take 2 tablets (20 mg total) by mouth 2 (two) times daily before a meal.     ibuprofen 800 MG tablet  Commonly known as:  ADVIL,MOTRIN  Take 800 mg by mouth every 8 (eight) hours as needed. For pain     insulin glargine 100 UNIT/ML injection  Commonly known as:  LANTUS  Inject 0.8 mLs (80 Units total) into the skin at bedtime.     Insulin Syringes (Disposable) U-100 1 ML Misc  Use to inject insulin as directed by physician     levothyroxine 150 MCG  tablet  Commonly known as:  SYNTHROID, LEVOTHROID  Take 1 tablet (150 mcg total) by mouth daily.     lisinopril-hydrochlorothiazide 20-25 MG per tablet  Commonly known as:  PRINZIDE,ZESTORETIC  Take 1 tablet by mouth daily.     omeprazole 40 MG capsule  Commonly known as:  PRILOSEC  TAKE 1 CAPSULE BY MOUTH ONCE DAILY     pantoprazole 40 MG tablet  Commonly known as:  PROTONIX  Take 1 tablet (40 mg total) by mouth daily.     penicillin v potassium 500 MG tablet  Commonly known as:  VEETID  Take 1 tablet (500 mg total) by mouth 4 (four) times daily.     traMADol 50 MG tablet  Commonly known as:  ULTRAM  1-2 tabs every 8 hours as needed pain        Meds ordered this encounter  Medications  . glipiZIDE (GLUCOTROL) 10 MG tablet    Sig: Take 2 tablets (20 mg total) by mouth 2 (two) times daily before a meal.    Dispense:  120 tablet    Refill:  3  . amoxicillin-clavulanate (AUGMENTIN) 875-125 MG per  tablet    Sig: Take 1 tablet by mouth 2 (two) times daily.    Dispense:  20 tablet    Refill:  0  . levothyroxine (SYNTHROID, LEVOTHROID) 150 MCG tablet    Sig: Take 1 tablet (150 mcg total) by mouth daily.    Dispense:  30 tablet    Refill:  3    Immunization History  Administered Date(s) Administered  . Influenza Split 05/17/2013    Family History  Problem Relation Age of Onset  . Diabetes Mother   . Heart disease Father   . Hypertension Sister   . Diabetes Sister     History  Substance Use Topics  . Smoking status: Current Every Day Smoker -- 0.20 packs/day    Types: Cigarettes  . Smokeless tobacco: Not on file  . Alcohol Use: No    Review of Systems   As noted in HPI  Filed Vitals:   10/19/13 1533  BP: 153/66  Pulse: 74  Temp: 98.1 F (36.7 C)  Resp: 16    Physical Exam  Physical Exam  Constitutional: No distress.  HENT:  Dental cavities   Eyes: EOM are normal. Pupils are equal, round, and reactive to light.  Cardiovascular: Normal rate  and regular rhythm.   Pulmonary/Chest: Breath sounds normal. No respiratory distress. She has no wheezes. She has no rales.  Musculoskeletal: She exhibits no edema.    CBC    Component Value Date/Time   WBC 10.7* 10/15/2013 0929   RBC 4.57 10/15/2013 0929   HGB 12.3 10/15/2013 0929   HCT 37.4 10/15/2013 0929   PLT 219 10/15/2013 0929   MCV 81.8 10/15/2013 0929   LYMPHSABS 4.5* 10/15/2013 0929   MONOABS 0.7 10/15/2013 0929   EOSABS 0.2 10/15/2013 0929   BASOSABS 0.0 10/15/2013 0929    CMP     Component Value Date/Time   NA 140 10/15/2013 0929   K 4.0 10/15/2013 0929   CL 103 10/15/2013 0929   CO2 23 10/15/2013 0929   GLUCOSE 136* 10/15/2013 0929   BUN 12 10/15/2013 0929   CREATININE 1.07 10/15/2013 0929   CREATININE 0.96 03/04/2010 2002   CALCIUM 9.7 10/15/2013 0929   PROT 6.5 10/15/2013 0929   ALBUMIN 4.2 10/15/2013 0929   AST 12 10/15/2013 0929   ALT 10 10/15/2013 0929   ALKPHOS 124* 10/15/2013 0929   BILITOT 0.3 10/15/2013 0929   GFRNONAA 52* 08/19/2009 0550   GFRAA  Value: >60        The eGFR has been calculated using the MDRD equation. This calculation has not been validated in all clinical situations. eGFR's persistently <60 mL/min signify possible Chronic Kidney Disease. 08/19/2009 0550    Lab Results  Component Value Date/Time   CHOL 204* 10/15/2013  9:29 AM    No components found with this basename: hga1c    Lab Results  Component Value Date/Time   AST 12 10/15/2013  9:29 AM    Assessment and Plan  DM (diabetes mellitus) -uncontrolled Results for orders placed in visit on 10/19/13  POCT GLYCOSYLATED HEMOGLOBIN (HGB A1C)      Result Value Ref Range   Hemoglobin A1C 10.1%      Plan: Patient will increased the dose of Lantus to 90 units each bedtime, I have increased Glucotrol to 20 mg daily, advised patient to continue fingerstick log.   Pain, dental - Plan: amoxicillin-clavulanate (AUGMENTIN) 875-125 MG per tablet, patient advised to follow up with the dentist.  Unspecified vitamin D  deficiency  Patient is allergic to ester, cannot take vitamin D supplement.  Unspecified hypothyroidism - Plan: Her TSH level is suppressed I have reduced the dose of Synthroid 150 mcg daily we'll repeat TSH level on the next visit. levothyroxine (SYNTHROID, LEVOTHROID) 150 MCG tablet  Essential hypertension, benign Patient is advised for low-salt diet, continue with lisinopril/hydrochlorothiazide and amlodipine 10 mg daily   Return in about 3 months (around 01/19/2014).  Lorayne Marek, MD

## 2013-10-19 NOTE — Progress Notes (Signed)
Patient here for her three month follow up_DM

## 2013-10-29 ENCOUNTER — Other Ambulatory Visit: Payer: Self-pay

## 2013-10-29 ENCOUNTER — Other Ambulatory Visit: Payer: Self-pay | Admitting: Internal Medicine

## 2013-10-29 DIAGNOSIS — E039 Hypothyroidism, unspecified: Secondary | ICD-10-CM

## 2013-10-29 MED ORDER — LEVOTHYROXINE SODIUM 150 MCG PO TABS: 150.0000 ug | ORAL_TABLET | Freq: Every day | ORAL | Status: DC

## 2013-10-30 ENCOUNTER — Other Ambulatory Visit: Payer: Self-pay | Admitting: Pharmacist

## 2013-10-30 DIAGNOSIS — I10 Essential (primary) hypertension: Secondary | ICD-10-CM

## 2013-10-30 MED ORDER — AMLODIPINE BESYLATE 10 MG PO TABS: 10.0000 mg | ORAL_TABLET | Freq: Every day | ORAL | Status: DC

## 2013-11-01 ENCOUNTER — Encounter (HOSPITAL_BASED_OUTPATIENT_CLINIC_OR_DEPARTMENT_OTHER): Payer: Self-pay | Admitting: Emergency Medicine

## 2013-11-01 ENCOUNTER — Emergency Department (HOSPITAL_BASED_OUTPATIENT_CLINIC_OR_DEPARTMENT_OTHER): Payer: No Typology Code available for payment source

## 2013-11-01 ENCOUNTER — Inpatient Hospital Stay (HOSPITAL_BASED_OUTPATIENT_CLINIC_OR_DEPARTMENT_OTHER)
Admission: EM | Admit: 2013-11-01 | Discharge: 2013-11-06 | DRG: 038 | Disposition: A | Discharge status: Home Health | Payer: No Typology Code available for payment source | Attending: Internal Medicine | Admitting: Internal Medicine

## 2013-11-01 ENCOUNTER — Inpatient Hospital Stay (HOSPITAL_COMMUNITY): Payer: No Typology Code available for payment source

## 2013-11-01 DIAGNOSIS — I63512 Cerebral infarction due to unspecified occlusion or stenosis of left middle cerebral artery: Secondary | ICD-10-CM

## 2013-11-01 DIAGNOSIS — IMO0001 Reserved for inherently not codable concepts without codable children: Secondary | ICD-10-CM

## 2013-11-01 DIAGNOSIS — E785 Hyperlipidemia, unspecified: Secondary | ICD-10-CM

## 2013-11-01 DIAGNOSIS — I6350 Cerebral infarction due to unspecified occlusion or stenosis of unspecified cerebral artery: Principal | ICD-10-CM | POA: Diagnosis present

## 2013-11-01 DIAGNOSIS — F17200 Nicotine dependence, unspecified, uncomplicated: Secondary | ICD-10-CM | POA: Diagnosis present

## 2013-11-01 DIAGNOSIS — E039 Hypothyroidism, unspecified: Secondary | ICD-10-CM

## 2013-11-01 DIAGNOSIS — I6529 Occlusion and stenosis of unspecified carotid artery: Secondary | ICD-10-CM

## 2013-11-01 DIAGNOSIS — I639 Cerebral infarction, unspecified: Secondary | ICD-10-CM | POA: Diagnosis present

## 2013-11-01 DIAGNOSIS — K0889 Other specified disorders of teeth and supporting structures: Secondary | ICD-10-CM

## 2013-11-01 DIAGNOSIS — R51 Headache: Secondary | ICD-10-CM

## 2013-11-01 DIAGNOSIS — E559 Vitamin D deficiency, unspecified: Secondary | ICD-10-CM

## 2013-11-01 DIAGNOSIS — E162 Hypoglycemia, unspecified: Secondary | ICD-10-CM | POA: Diagnosis not present

## 2013-11-01 DIAGNOSIS — E1165 Type 2 diabetes mellitus with hyperglycemia: Secondary | ICD-10-CM

## 2013-11-01 DIAGNOSIS — K219 Gastro-esophageal reflux disease without esophagitis: Secondary | ICD-10-CM | POA: Diagnosis present

## 2013-11-01 DIAGNOSIS — I10 Essential (primary) hypertension: Secondary | ICD-10-CM | POA: Diagnosis present

## 2013-11-01 DIAGNOSIS — Z794 Long term (current) use of insulin: Secondary | ICD-10-CM

## 2013-11-01 DIAGNOSIS — E860 Dehydration: Secondary | ICD-10-CM | POA: Diagnosis not present

## 2013-11-01 DIAGNOSIS — R519 Headache, unspecified: Secondary | ICD-10-CM

## 2013-11-01 DIAGNOSIS — Z8673 Personal history of transient ischemic attack (TIA), and cerebral infarction without residual deficits: Secondary | ICD-10-CM

## 2013-11-01 DIAGNOSIS — G459 Transient cerebral ischemic attack, unspecified: Secondary | ICD-10-CM | POA: Diagnosis present

## 2013-11-01 DIAGNOSIS — H3400 Transient retinal artery occlusion, unspecified eye: Secondary | ICD-10-CM | POA: Diagnosis present

## 2013-11-01 DIAGNOSIS — Z8249 Family history of ischemic heart disease and other diseases of the circulatory system: Secondary | ICD-10-CM

## 2013-11-01 DIAGNOSIS — E1149 Type 2 diabetes mellitus with other diabetic neurological complication: Secondary | ICD-10-CM

## 2013-11-01 DIAGNOSIS — E118 Type 2 diabetes mellitus with unspecified complications: Secondary | ICD-10-CM | POA: Diagnosis present

## 2013-11-01 DIAGNOSIS — Z720 Tobacco use: Secondary | ICD-10-CM

## 2013-11-01 DIAGNOSIS — E119 Type 2 diabetes mellitus without complications: Secondary | ICD-10-CM | POA: Diagnosis present

## 2013-11-01 DIAGNOSIS — R630 Anorexia: Secondary | ICD-10-CM | POA: Diagnosis present

## 2013-11-01 DIAGNOSIS — R279 Unspecified lack of coordination: Secondary | ICD-10-CM | POA: Diagnosis present

## 2013-11-01 DIAGNOSIS — K118 Other diseases of salivary glands: Secondary | ICD-10-CM

## 2013-11-01 HISTORY — DX: Gastro-esophageal reflux disease without esophagitis: K21.9

## 2013-11-01 HISTORY — DX: Adverse effect of unspecified anesthetic, initial encounter: T41.45XA

## 2013-11-01 HISTORY — DX: Other complications of anesthesia, initial encounter: T88.59XA

## 2013-11-01 LAB — COMPREHENSIVE METABOLIC PANEL
ALT: 15 U/L (ref 0–35)
AST: 14 U/L (ref 0–37)
Albumin: 4.3 g/dL (ref 3.5–5.2)
Alkaline Phosphatase: 162 U/L — ABNORMAL HIGH (ref 39–117)
BUN: 9 mg/dL (ref 6–23)
CO2: 25 mEq/L (ref 19–32)
Calcium: 10.3 mg/dL (ref 8.4–10.5)
Chloride: 101 mEq/L (ref 96–112)
Creatinine, Ser: 0.9 mg/dL (ref 0.50–1.10)
GFR calc Af Amer: 77 mL/min — ABNORMAL LOW (ref >90)
GFR calc non Af Amer: 66 mL/min — ABNORMAL LOW (ref >90)
Glucose, Bld: 127 mg/dL — ABNORMAL HIGH (ref 70–99)
Potassium: 3.4 mEq/L — ABNORMAL LOW (ref 3.7–5.3)
Sodium: 143 mEq/L (ref 137–147)
Total Bilirubin: <0.2 mg/dL — ABNORMAL LOW (ref 0.3–1.2)
Total Protein: 8.2 g/dL (ref 6.0–8.3)

## 2013-11-01 LAB — URINALYSIS, ROUTINE W REFLEX MICROSCOPIC
Bilirubin Urine: NEGATIVE
Glucose, UA: NEGATIVE mg/dL
Hgb urine dipstick: NEGATIVE
Ketones, ur: NEGATIVE mg/dL
Nitrite: NEGATIVE
Protein, ur: NEGATIVE mg/dL
Specific Gravity, Urine: 1.004 — ABNORMAL LOW (ref 1.005–1.030)
Urobilinogen, UA: 0.2 mg/dL (ref 0.0–1.0)
pH: 6 (ref 5.0–8.0)

## 2013-11-01 LAB — CBC
HCT: 41.1 % (ref 36.0–46.0)
HCT: 36.1 % (ref 36.0–46.0)
Hemoglobin: 13.6 g/dL (ref 12.0–15.0)
Hemoglobin: 11.8 g/dL — ABNORMAL LOW (ref 12.0–15.0)
MCH: 27 pg (ref 26.0–34.0)
MCH: 26.6 pg (ref 26.0–34.0)
MCHC: 33.1 g/dL (ref 30.0–36.0)
MCHC: 32.7 g/dL (ref 30.0–36.0)
MCV: 81.7 fL (ref 78.0–100.0)
MCV: 81.5 fL (ref 78.0–100.0)
Platelets: 213 10*3/uL (ref 150–400)
Platelets: 186 10*3/uL (ref 150–400)
RBC: 5.03 MIL/uL (ref 3.87–5.11)
RBC: 4.43 MIL/uL (ref 3.87–5.11)
RDW: 14 % (ref 11.5–15.5)
RDW: 14 % (ref 11.5–15.5)
WBC: 12.6 10*3/uL — ABNORMAL HIGH (ref 4.0–10.5)
WBC: 11.8 10*3/uL — ABNORMAL HIGH (ref 4.0–10.5)

## 2013-11-01 LAB — GLUCOSE, CAPILLARY: Glucose-Capillary: 185 mg/dL — ABNORMAL HIGH (ref 70–99)

## 2013-11-01 LAB — CBG MONITORING, ED
Glucose-Capillary: 187 mg/dL — ABNORMAL HIGH (ref 70–99)
Glucose-Capillary: 44 mg/dL — CL (ref 70–99)
Glucose-Capillary: 93 mg/dL (ref 70–99)

## 2013-11-01 LAB — RAPID URINE DRUG SCREEN, HOSP PERFORMED
Amphetamines: NOT DETECTED
Barbiturates: NOT DETECTED
Benzodiazepines: NOT DETECTED
Cocaine: NOT DETECTED
Opiates: NOT DETECTED
Tetrahydrocannabinol: NOT DETECTED

## 2013-11-01 LAB — DIFFERENTIAL
Basophils Absolute: 0 10*3/uL (ref 0.0–0.1)
Basophils Relative: 0 % (ref 0–1)
Eosinophils Absolute: 0.2 10*3/uL (ref 0.0–0.7)
Eosinophils Relative: 1 % (ref 0–5)
Lymphocytes Relative: 31 % (ref 12–46)
Lymphs Abs: 3.9 10*3/uL (ref 0.7–4.0)
Monocytes Absolute: 0.7 10*3/uL (ref 0.1–1.0)
Monocytes Relative: 6 % (ref 3–12)
Neutro Abs: 7.8 10*3/uL — ABNORMAL HIGH (ref 1.7–7.7)
Neutrophils Relative %: 62 % (ref 43–77)

## 2013-11-01 LAB — ETHANOL: Alcohol, Ethyl (B): <11 mg/dL (ref 0–11)

## 2013-11-01 LAB — CREATININE, SERUM
Creatinine, Ser: 0.88 mg/dL (ref 0.50–1.10)
GFR calc Af Amer: 79 mL/min — ABNORMAL LOW (ref >90)
GFR calc non Af Amer: 68 mL/min — ABNORMAL LOW (ref >90)

## 2013-11-01 LAB — PROTIME-INR
INR: 0.95 (ref 0.00–1.49)
Prothrombin Time: 12.5 seconds (ref 11.6–15.2)

## 2013-11-01 LAB — TROPONIN I: Troponin I: <0.3 ng/mL (ref <0.30)

## 2013-11-01 LAB — URINE MICROSCOPIC-ADD ON

## 2013-11-01 LAB — APTT: aPTT: 31 seconds (ref 24–37)

## 2013-11-01 MED ORDER — AMLODIPINE BESYLATE 10 MG PO TABS
10.0000 mg | ORAL_TABLET | Freq: Every day | ORAL | Status: DC
  Administered 2013-11-02 – 2013-11-03 (×2): 10 mg via ORAL
  Filled 2013-11-01 (×3): qty 1

## 2013-11-01 MED ORDER — INSULIN GLARGINE 100 UNIT/ML ~~LOC~~ SOLN
40.0000 [IU] | Freq: Every day | SUBCUTANEOUS | Status: DC
  Administered 2013-11-01 – 2013-11-02 (×2): 40 [IU] via SUBCUTANEOUS
  Filled 2013-11-01 (×3): qty 0.4

## 2013-11-01 MED ORDER — ASPIRIN 325 MG PO TABS
325.0000 mg | ORAL_TABLET | Freq: Every day | ORAL | Status: DC
  Administered 2013-11-01 – 2013-11-06 (×5): 325 mg via ORAL
  Filled 2013-11-01 (×6): qty 1

## 2013-11-01 MED ORDER — PANTOPRAZOLE SODIUM 40 MG PO TBEC
40.0000 mg | DELAYED_RELEASE_TABLET | Freq: Every day | ORAL | Status: DC
  Administered 2013-11-02 – 2013-11-06 (×4): 40 mg via ORAL
  Filled 2013-11-01 (×4): qty 1

## 2013-11-01 MED ORDER — LEVOTHYROXINE SODIUM 150 MCG PO TABS
150.0000 ug | ORAL_TABLET | Freq: Every day | ORAL | Status: DC
  Administered 2013-11-02 – 2013-11-06 (×4): 150 ug via ORAL
  Filled 2013-11-01 (×7): qty 1

## 2013-11-01 MED ORDER — SENNOSIDES-DOCUSATE SODIUM 8.6-50 MG PO TABS
1.0000 | ORAL_TABLET | Freq: Every evening | ORAL | Status: DC | PRN
  Filled 2013-11-01: qty 1

## 2013-11-01 MED ORDER — PNEUMOCOCCAL VAC POLYVALENT 25 MCG/0.5ML IJ INJ
0.5000 mL | INJECTION | INTRAMUSCULAR | Status: AC
  Administered 2013-11-02: 0.5 mL via INTRAMUSCULAR
  Filled 2013-11-01: qty 0.5

## 2013-11-01 MED ORDER — ASPIRIN 300 MG RE SUPP
300.0000 mg | Freq: Every day | RECTAL | Status: DC
  Filled 2013-11-01 (×6): qty 1

## 2013-11-01 MED ORDER — DEXTROSE 50 % IV SOLN
50.0000 mL | Freq: Once | INTRAVENOUS | Status: AC
  Administered 2013-11-01: 50 mL via INTRAVENOUS
  Filled 2013-11-01: qty 50

## 2013-11-01 MED ORDER — INSULIN ASPART 100 UNIT/ML ~~LOC~~ SOLN: 0.0000 [IU] | Freq: Every day | SUBCUTANEOUS | Status: DC

## 2013-11-01 MED ORDER — INSULIN ASPART 100 UNIT/ML ~~LOC~~ SOLN
0.0000 [IU] | Freq: Three times a day (TID) | SUBCUTANEOUS | Status: DC
  Administered 2013-11-02: 2 [IU] via SUBCUTANEOUS
  Administered 2013-11-02: 3 [IU] via SUBCUTANEOUS
  Administered 2013-11-03: 5 [IU] via SUBCUTANEOUS
  Administered 2013-11-03 (×2): 2 [IU] via SUBCUTANEOUS
  Administered 2013-11-04: 3 [IU] via SUBCUTANEOUS
  Administered 2013-11-05: 2 [IU] via SUBCUTANEOUS
  Administered 2013-11-05: 3 [IU] via SUBCUTANEOUS

## 2013-11-01 MED ORDER — INSULIN ASPART 100 UNIT/ML ~~LOC~~ SOLN
4.0000 [IU] | Freq: Three times a day (TID) | SUBCUTANEOUS | Status: DC
  Administered 2013-11-02 – 2013-11-05 (×7): 4 [IU] via SUBCUTANEOUS

## 2013-11-01 MED ORDER — BUPROPION HCL ER (XL) 300 MG PO TB24
300.0000 mg | ORAL_TABLET | Freq: Every day | ORAL | Status: DC
  Administered 2013-11-02 – 2013-11-06 (×4): 300 mg via ORAL
  Filled 2013-11-01 (×5): qty 1

## 2013-11-01 MED ORDER — ASPIRIN 81 MG PO CHEW
324.0000 mg | CHEWABLE_TABLET | Freq: Once | ORAL | Status: AC
  Administered 2013-11-01: 324 mg via ORAL
  Filled 2013-11-01: qty 4

## 2013-11-01 MED ORDER — NICOTINE 14 MG/24HR TD PT24
14.0000 mg | MEDICATED_PATCH | Freq: Every day | TRANSDERMAL | Status: DC
  Administered 2013-11-02 – 2013-11-06 (×4): 14 mg via TRANSDERMAL
  Filled 2013-11-01 (×5): qty 1

## 2013-11-01 MED ORDER — HEPARIN SODIUM (PORCINE) 5000 UNIT/ML IJ SOLN
5000.0000 [IU] | Freq: Three times a day (TID) | INTRAMUSCULAR | Status: DC
  Administered 2013-11-01 – 2013-11-03 (×5): 5000 [IU] via SUBCUTANEOUS
  Filled 2013-11-01 (×8): qty 1

## 2013-11-01 NOTE — H&P (Signed)
[facility] History and Physical  Sonia Skinner [id number] DOB: August 24, 1950 DOA: 11/01/2013  Referring physician: Dr. Betsey Holiday PCP: Angelica Chessman, MD   Chief Complaint: Right sided weakness  HPI: Sonia Skinner is a 63 y.o. female  With past medical history of hypertension diabetes ongoing smoking that comes in visual disturbances on the left on with right-sided weakness this started the day prior to admission. She relates she did not pay much attention to it but woke up this morning was persistent she calls it dark perception on the left eye and right arm clumsiness during the morning. As she can grab things. She was feeling somewhat weakness on her right arm before she went to sleep but she did not pay attention. She relates no slurred speech no coughing with drinking or eating.  In the ED: Basic metabolic panel was done that shows excess of 3.4 CBC was done that shows a white count 12.6 UA was negative CT scan as below.   Review of Systems:  Constitutional:  No weight loss, night sweats, Fevers, chills, fatigue.  HEENT:  No headaches, Difficulty swallowing,Tooth/dental problems,Sore throat,  No sneezing, itching, ear ache, nasal congestion, post nasal drip,  Cardio-vascular:  No chest pain, Orthopnea, PND, swelling in lower extremities, anasarca, dizziness, palpitations  GI:  No heartburn, indigestion, abdominal pain, nausea, vomiting, diarrhea, change in bowel habits, loss of appetite  Resp:  No shortness of breath with exertion or at rest. No excess mucus, no productive cough, No non-productive cough, No coughing up of blood.No change in color of mucus.No wheezing.No chest wall deformity  Skin:  no rash or lesions.  GU:  no dysuria, change in color of urine, no urgency or frequency. No flank pain.  Musculoskeletal:  No joint pain or swelling. No decreased range of motion. No back pain.  Psych:  No change in mood or affect. No depression or anxiety. No memory loss.    Past Medical History  Diagnosis Date  . Diabetes mellitus   . Hypertension   . Thyroid disease    Past Surgical History  Procedure Laterality Date  . Abdominal hysterectomy     Social History:  reports that she has been smoking Cigarettes.  She has been smoking about 0.20 packs per day. She does not have any smokeless tobacco history on file. She reports that she does not drink alcohol or use illicit drugs.  Allergies  Allergen Reactions  . Anesthetics, Ester Anaphylaxis    Family History  Problem Relation Age of Onset  . Diabetes Mother   . Heart disease Mother   . Heart disease Father   . Hypertension Sister   . Diabetes Sister      Prior to Admission medications   Medication Sig Start Date End Date Taking? Authorizing Provider  amLODipine (NORVASC) 10 MG tablet Take 1 tablet (10 mg total) by mouth daily. 10/30/13   Lorayne Marek, MD  amoxicillin-clavulanate (AUGMENTIN) 875-125 MG per tablet Take 1 tablet by mouth 2 (two) times daily. 10/19/13   Lorayne Marek, MD  buPROPion (WELLBUTRIN XL) 150 MG 24 hr tablet 2 tabs in AM and 1 in PM 07/31/13   Lind Covert, MD  carbamide peroxide (DEBROX) 6.5 % otic solution Place 5 drops into the left ear 2 (two) times daily. 07/17/13   Lorayne Marek, MD  glipiZIDE (GLUCOTROL) 10 MG tablet Take 2 tablets (20 mg total) by mouth 2 (two) times daily before a meal. 10/19/13   Lorayne Marek, MD  ibuprofen (ADVIL,MOTRIN) 800  MG tablet Take 800 mg by mouth every 8 (eight) hours as needed. For pain    Historical Provider, MD  insulin glargine (LANTUS) 100 UNIT/ML injection Inject 0.8 mLs (80 Units total) into the skin at bedtime. 07/18/13   Angelica Chessman, MD  Insulin Syringes, Disposable, U-100 1 ML MISC Use to inject insulin as directed by physician 08/01/13   Lorayne Marek, MD  levothyroxine (SYNTHROID, LEVOTHROID) 150 MCG tablet Take 1 tablet (150 mcg total) by mouth daily. 10/29/13   Lorayne Marek, MD  lisinopril-hydrochlorothiazide  (PRINZIDE,ZESTORETIC) 20-25 MG per tablet Take 1 tablet by mouth daily. 04/23/13   Angelica Chessman, MD  omeprazole (PRILOSEC) 40 MG capsule TAKE 1 CAPSULE BY MOUTH ONCE DAILY 08/27/13   Ripudeep Krystal Eaton, MD  pantoprazole (PROTONIX) 40 MG tablet Take 1 tablet (40 mg total) by mouth daily. 04/20/13   Ripudeep Krystal Eaton, MD  penicillin v potassium (VEETID) 500 MG tablet Take 1 tablet (500 mg total) by mouth 4 (four) times daily. 07/31/13   Lind Covert, MD  traMADol Veatrice Bourbon) 50 MG tablet 1-2 tabs every 8 hours as needed pain 07/31/13   Lind Covert, MD   Physical Exam: Filed Vitals:   11/01/13 1524  BP: 130/48  Pulse: 90  Temp: 98.3 F (36.8 C)  Resp: 18    BP 130/48  Pulse 90  Temp(Src) 98.3 F (36.8 C) (Oral)  Resp 18  Ht 5\' 2"  (1.575 m)  Wt 66.225 kg (146 lb)  BMI 26.70 kg/m2  SpO2 98%  General:  Appears calm and comfortable Eyes: PERRL, normal lids, irises & conjunctiva ENT: grossly normal hearing, lips & tongue Neck: no LAD, masses or thyromegaly Cardiovascular: RRR, no m/r/g. No LE edema. Telemetry: SR, no arrhythmias  Respiratory: CTA bilaterally, no w/r/r. Normal respiratory effort. Abdomen: soft, ntnd Skin: no rash or induration seen on limited exam Musculoskeletal: grossly normal tone BUE/BLE Psychiatric: grossly normal mood and affect, speech fluent and appropriate Neurologic: Cranial nerves 3-12 are grossly intact sensation is intact in all muscle strength is 5 over 5 in all 4 extremity deep tendon reflexes 2+ bilaterally .          Labs on Admission:  Basic Metabolic Panel:  Recent Labs Lab 11/01/13 0930  NA 143  K 3.4*  CL 101  CO2 25  GLUCOSE 127*  BUN 9  CREATININE 0.90  CALCIUM 10.3   Liver Function Tests:  Recent Labs Lab 11/01/13 0930  AST 14  ALT 15  ALKPHOS 162*  BILITOT <0.2*  PROT 8.2  ALBUMIN 4.3   No results found for this basename: LIPASE, AMYLASE,  in the last 168 hours No results found for this basename: AMMONIA,  in  the last 168 hours CBC:  Recent Labs Lab 11/01/13 0930  WBC 12.6*  NEUTROABS 7.8*  HGB 13.6  HCT 41.1  MCV 81.7  PLT 213   Cardiac Enzymes:  Recent Labs Lab 11/01/13 0930  TROPONINI <0.30    BNP (last 3 results) No results found for this basename: PROBNP,  in the last 8760 hours CBG:  Recent Labs Lab 11/01/13 1331 11/01/13 1400 11/01/13 1552  GLUCAP 44* 187* 93    Radiological Exams on Admission: Ct Head Wo Contrast  11/01/2013   CLINICAL DATA:  Right-sided weakness for 2 days  EXAM: CT HEAD WITHOUT CONTRAST  TECHNIQUE: Contiguous axial images were obtained from the base of the skull through the vertex without intravenous contrast.  COMPARISON:  None.  FINDINGS: There is no evidence  of mass effect, midline shift or extra-axial fluid collections. There is no evidence of a space-occupying lesion or intracranial hemorrhage. There is an area of low-attenuation at the gray-white junction in the left frontal lobe superiorly (image 22) which may reflect volume averaging versus a subacute infarct. There are no other areas of concerning for cortical infarction.  The ventricles and sulci are appropriate for the patient's age. The basal cisterns are patent.  Visualized portions of the orbits are unremarkable. There is mild sphenoid sinus mucosal thickening.  The osseous structures are unremarkable.  IMPRESSION: There is an area of low-attenuation at the gray-white junction in the left frontal lobe superiorly (image 22) which may reflect volume averaging versus a subacute infarct.   Electronically Signed   By: Kathreen Devoid   On: 11/01/2013 10:10    EKG: Independently reviewed. Normal sinus rthym  Assessment/Plan  CVA (cerebral vascular accident) - HgbA1c, fasting lipid panel  - MRI, MRA of the brain without contrast  - PT consult, OT consult, Speech consult  - Echocardiogram  And Carotid dopplers  - Prophylactic therapy-Antiplatelet med: Aspirin - dose 325 mg PO daily  - Avoid D5  fluids as may be harmfull - risk factor modification  - Cardiac Monitoring  - Neurochecks q4h  - Keep MAP 70 - tobacco counseling cessation. Start nicotine patch   Essential hypertension, benign: - BP stable hold HCTZ and lisinopril, cont CCB. - Allow permissive HTN.  Type II or unspecified type diabetes mellitus - She is on a large dose of insulin. Will use half a dose of Lantus and start SSI, once she passes her swallowing evaluation. - will also give her a carb modified diet once she passes her swallowing eval.    Code Status: full Family Communication: none Disposition Plan: inpatient  Time spent: 75 minutes  Charlynne Cousins [facility] Pager [phone]

## 2013-11-01 NOTE — ED Notes (Signed)
Patient transported to CT via stretcher per tech. 

## 2013-11-01 NOTE — ED Notes (Signed)
Pt reports several days of having left eye color changes in visual field.  Reports last night having ataxia and weakness in right hand.

## 2013-11-01 NOTE — ED Notes (Signed)
MD at bedside. 

## 2013-11-01 NOTE — Consult Note (Signed)
Referring Physician: Dr. Aileen Fass    Chief Complaint: Visual changes involving left eye and right upper extremity weakness.  HPI: Sonia Skinner is an 63 y.o. female with a history of hypertension and diabetes mellitus presenting with a complaint of intermittent blurring of vision involving left eye for about one week, and new onset weakness involving right upper extremity. Patient woke up at midnight last night with right arm weakness. She had no symptoms when she went to bed at 9 PM. CT scan of her head showed no acute intracranial abnormality except for possible abnormal density involving the left frontal lobe superiorly, suggestive of possible subacute stroke. Patient has not been on antiplatelet therapy. Has no previous history of stroke nor TIA. NIH stroke score was 1 for pronation and drift of right upper extremity.  LSN: 9 PM on 10/31/2013 tPA Given: No: Mild deficits; beyond time window for treatment consideration. MRankin: 0  Past Medical History  Diagnosis Date  . Hypertension   . Thyroid disease   . Complication of anesthesia     ALLERY TO ESTER BASE  . Diabetes mellitus     INSULIN DEPENDENT  . GERD (gastroesophageal reflux disease)     Family History  Problem Relation Age of Onset  . Diabetes Mother   . Heart disease Mother   . Heart disease Father   . Hypertension Sister   . Diabetes Sister      Medications: I have reviewed the patient's current medications.  ROS: History obtained from the patient  General ROS: negative for - chills, fatigue, fever, night sweats, weight gain or weight loss Psychological ROS: negative for - behavioral disorder, hallucinations, memory difficulties, mood swings or suicidal ideation Ophthalmic ROS: negative for - blurry vision, double vision, eye pain or loss of vision ENT ROS: negative for - epistaxis, nasal discharge, oral lesions, sore throat, tinnitus or vertigo Allergy and Immunology ROS: negative for - hives or itchy/watery  eyes Hematological and Lymphatic ROS: negative for - bleeding problems, bruising or swollen lymph nodes Endocrine ROS: negative for - galactorrhea, hair pattern changes, polydipsia/polyuria or temperature intolerance Respiratory ROS: negative for - cough, hemoptysis, shortness of breath or wheezing Cardiovascular ROS: negative for - chest pain, dyspnea on exertion, edema or irregular heartbeat Gastrointestinal ROS: negative for - abdominal pain, diarrhea, hematemesis, nausea/vomiting or stool incontinence Genito-Urinary ROS: negative for - dysuria, hematuria, incontinence or urinary frequency/urgency Musculoskeletal ROS: negative for - joint swelling or muscular weakness Neurological ROS: as noted in HPI Dermatological ROS: negative for rash and skin lesion changes Physical Examination: Blood pressure 149/75, pulse 98, temperature 98.4 F (36.9 C), temperature source Oral, resp. rate 18, height 5\' 2"  (1.575 m), weight 66.225 kg (146 lb), SpO2 99.00%.  Neurologic Examination: Mental Status: Alert, oriented, thought content appropriate.  Speech fluent without evidence of aphasia. Able to follow commands without difficulty. Cranial Nerves: II-Visual fields were normal. III/IV/VI-Pupils were equal and reacted. Extraocular movements were full and conjugate.    V/VII-no facial numbness and no facial weakness. VIII-normal. X-normal speech and symmetrical palatal movement. Motor: Mild drift of right upper extremity with pronation; motor exam otherwise normal. Sensory: Normal throughout. Deep Tendon Reflexes: 1+ in upper extremities, trace only at the knees and absent at the ankles. Plantars: Mute bilaterally Cerebellar: Normal finger-to-nose testing. Carotid auscultation: Bilateral carotid bruits, right greater than left  Dg Chest 2 View  11/01/2013   CLINICAL DATA:  Stroke.  Smoker.  EXAM: CHEST  2 VIEW  COMPARISON:  None.  FINDINGS: Lungs are  clear. Heart size is normal. No pneumothorax or  pleural effusion.  IMPRESSION: Negative chest.   Electronically Signed   By: Inge Rise M.D.   On: 11/01/2013 21:33   Ct Head Wo Contrast  11/01/2013   CLINICAL DATA:  Right-sided weakness for 2 days  EXAM: CT HEAD WITHOUT CONTRAST  TECHNIQUE: Contiguous axial images were obtained from the base of the skull through the vertex without intravenous contrast.  COMPARISON:  None.  FINDINGS: There is no evidence of mass effect, midline shift or extra-axial fluid collections. There is no evidence of a space-occupying lesion or intracranial hemorrhage. There is an area of low-attenuation at the gray-white junction in the left frontal lobe superiorly (image 22) which may reflect volume averaging versus a subacute infarct. There are no other areas of concerning for cortical infarction.  The ventricles and sulci are appropriate for the patient's age. The basal cisterns are patent.  Visualized portions of the orbits are unremarkable. There is mild sphenoid sinus mucosal thickening.  The osseous structures are unremarkable.  IMPRESSION: There is an area of low-attenuation at the gray-white junction in the left frontal lobe superiorly (image 22) which may reflect volume averaging versus a subacute infarct.   Electronically Signed   By: Kathreen Devoid   On: 11/01/2013 10:10    Assessment: 63 y.o. female with multiple risk factors for stroke presenting with possible left subcortical small vessel acute ischemic stroke, as well as possible significant carotid artery disease with recurrent visual symptoms suggestive of amaurosis fugax.  Stroke Risk Factors - diabetes mellitus and hypertension  Plan: 1. HgbA1c, fasting lipid panel 2. MRI of the brain without contrast 3. PT consult, OT consult, Speech consult 4. Echocardiogram 5. CTA with contrast of head and neck 6. Prophylactic therapy-Antiplatelet med: Aspirin  7. Risk factor modification 8. Telemetry monitoring   C.R. Nicole Kindred, MD [facility] [phone]  11/01/2013, 9:46 PM

## 2013-11-01 NOTE — ED Provider Notes (Signed)
CSN: [id number]     Arrival date & time 11/01/13  [id number] History   First MD Initiated Contact with Patient 11/01/13 0919     Chief Complaint  Patient presents with  . Weakness     (Consider location/radiation/quality/duration/timing/severity/associated sxs/prior Treatment) HPI Comments: Patient presents to the ER for evaluation of visual disturbance as well as right arm weakness. Patient reports that for the last 3 days or so she has noticed a change in vision in her left eye. She reports that things are "dark" with changing color perception in the left eye. She has not had any pain in the high or headache.  Last night, around dinner time, she noticed a clumsiness of her right arm. The arm feels normal Lahey and weaker than the left arm. She went to sleep and awakened this morning with persistent symptoms in the right arm. She has not had any right leg involvement. No trouble with speech.  Patient is a 63 y.o. female presenting with weakness.  Weakness Pertinent negatives include no headaches.    Past Medical History  Diagnosis Date  . Diabetes mellitus   . Hypertension   . Thyroid disease    Past Surgical History  Procedure Laterality Date  . Abdominal hysterectomy     Family History  Problem Relation Age of Onset  . Diabetes Mother   . Heart disease Father   . Hypertension Sister   . Diabetes Sister    History  Substance Use Topics  . Smoking status: Current Every Day Smoker -- 0.20 packs/day    Types: Cigarettes  . Smokeless tobacco: Not on file  . Alcohol Use: No   OB History   Grav Para Term Preterm Abortions TAB SAB Ect Mult Living                 Review of Systems  Eyes: Positive for visual disturbance.  Neurological: Positive for weakness (right arm). Negative for dizziness, syncope and headaches.  All other systems reviewed and are negative.      Allergies  Anesthetics, ester  Home Medications   Current Outpatient Rx  Name  Route  Sig  Dispense   Refill  . amLODipine (NORVASC) 10 MG tablet   Oral   Take 1 tablet (10 mg total) by mouth daily.   30 tablet   4   . amoxicillin-clavulanate (AUGMENTIN) 875-125 MG per tablet   Oral   Take 1 tablet by mouth 2 (two) times daily.   20 tablet   0   . buPROPion (WELLBUTRIN XL) 150 MG 24 hr tablet      2 tabs in AM and 1 in PM   90 tablet   1   . carbamide peroxide (DEBROX) 6.5 % otic solution   Left Ear   Place 5 drops into the left ear 2 (two) times daily.   15 mL   1   . glipiZIDE (GLUCOTROL) 10 MG tablet   Oral   Take 2 tablets (20 mg total) by mouth 2 (two) times daily before a meal.   120 tablet   3   . ibuprofen (ADVIL,MOTRIN) 800 MG tablet   Oral   Take 800 mg by mouth every 8 (eight) hours as needed. For pain         . insulin glargine (LANTUS) 100 UNIT/ML injection   Subcutaneous   Inject 0.8 mLs (80 Units total) into the skin at bedtime.   80 mL   3   . Insulin Syringes, Disposable,  U-100 1 ML MISC      Use to inject insulin as directed by physician   100 each   2   . levothyroxine (SYNTHROID, LEVOTHROID) 150 MCG tablet   Oral   Take 1 tablet (150 mcg total) by mouth daily.   90 tablet   3   . lisinopril-hydrochlorothiazide (PRINZIDE,ZESTORETIC) 20-25 MG per tablet   Oral   Take 1 tablet by mouth daily.   90 tablet   3   . omeprazole (PRILOSEC) 40 MG capsule      TAKE 1 CAPSULE BY MOUTH ONCE DAILY   30 capsule   3   . pantoprazole (PROTONIX) 40 MG tablet   Oral   Take 1 tablet (40 mg total) by mouth daily.   30 tablet   3   . penicillin v potassium (VEETID) 500 MG tablet   Oral   Take 1 tablet (500 mg total) by mouth 4 (four) times daily.   60 tablet   1   . traMADol (ULTRAM) 50 MG tablet      1-2 tabs every 8 hours as needed pain   60 tablet   0    BP 149/62  Pulse 92  Temp(Src) 98.2 F (36.8 C) (Oral)  Resp 18  Ht 5\' 2"  (1.575 m)  Wt 146 lb (66.225 kg)  BMI 26.70 kg/m2  SpO2 98% Physical Exam  Constitutional: She  is oriented to person, place, and time. She appears well-developed and well-nourished. No distress.  HENT:  Head: Normocephalic and atraumatic.  Right Ear: Hearing normal.  Left Ear: Hearing normal.  Nose: Nose normal.  Mouth/Throat: Oropharynx is clear and moist and mucous membranes are normal.  Eyes: Conjunctivae and EOM are normal. Pupils are equal, round, and reactive to light.  Fundoscopic exam:      The left eye shows no exudate, no hemorrhage and no papilledema.  Neck: Normal range of motion. Neck supple.  Cardiovascular: Regular rhythm, S1 normal and S2 normal.  Exam reveals no gallop and no friction rub.   No murmur heard. Pulmonary/Chest: Effort normal and breath sounds normal. No respiratory distress. She exhibits no tenderness.  Abdominal: Soft. Normal appearance and bowel sounds are normal. There is no hepatosplenomegaly. There is no tenderness. There is no rebound, no guarding, no tenderness at McBurney's point and negative Murphy's sign. No hernia.  Musculoskeletal: Normal range of motion.  Neurological: She is alert and oriented to person, place, and time. She has normal strength. A sensory deficit (RUE) is present. No cranial nerve deficit. She exhibits abnormal muscle tone (RUE). Coordination normal. GCS eye subscore is 4. GCS verbal subscore is 5. GCS motor subscore is 6.  Patient has ataxia with the right upper extremity fine motor movements such as finger to nose  Faint decreased sensation with preserved, 5 out of 5 gross motor  No perceived pronator drift  Skin: Skin is warm, dry and intact. No rash noted. No cyanosis.  Psychiatric: She has a normal mood and affect. Her speech is normal and behavior is normal. Thought content normal.    ED Course  Procedures (including critical care time) Labs Review Labs Reviewed  CBC - Abnormal; Notable for the following:    WBC 12.6 (*)    All other components within normal limits  DIFFERENTIAL - Abnormal; Notable for the  following:    Neutro Abs 7.8 (*)    All other components within normal limits  COMPREHENSIVE METABOLIC PANEL - Abnormal; Notable for the following:  Potassium 3.4 (*)    Glucose, Bld 127 (*)    Alkaline Phosphatase 162 (*)    Total Bilirubin <0.2 (*)    GFR calc non Af Amer 66 (*)    GFR calc Af Amer 77 (*)    All other components within normal limits  URINALYSIS, ROUTINE W REFLEX MICROSCOPIC - Abnormal; Notable for the following:    Specific Gravity, Urine 1.004 (*)    Leukocytes, UA TRACE (*)    All other components within normal limits  URINE MICROSCOPIC-ADD ON - Abnormal; Notable for the following:    Squamous Epithelial / LPF FEW (*)    All other components within normal limits  ETHANOL  PROTIME-INR  APTT  URINE RAPID DRUG SCREEN (HOSP PERFORMED)  TROPONIN I   Imaging Review Ct Head Wo Contrast  11/01/2013   CLINICAL DATA:  Right-sided weakness for 2 days  EXAM: CT HEAD WITHOUT CONTRAST  TECHNIQUE: Contiguous axial images were obtained from the base of the skull through the vertex without intravenous contrast.  COMPARISON:  None.  FINDINGS: There is no evidence of mass effect, midline shift or extra-axial fluid collections. There is no evidence of a space-occupying lesion or intracranial hemorrhage. There is an area of low-attenuation at the gray-white junction in the left frontal lobe superiorly (image 22) which may reflect volume averaging versus a subacute infarct. There are no other areas of concerning for cortical infarction.  The ventricles and sulci are appropriate for the patient's age. The basal cisterns are patent.  Visualized portions of the orbits are unremarkable. There is mild sphenoid sinus mucosal thickening.  The osseous structures are unremarkable.  IMPRESSION: There is an area of low-attenuation at the gray-white junction in the left frontal lobe superiorly (image 22) which may reflect volume averaging versus a subacute infarct.   Electronically Signed   By: Kathreen Devoid   On: 11/01/2013 10:10     EKG Interpretation   Date/Time:  Thursday November 01 2013 09:29:17 EDT Ventricular Rate:  93 PR Interval:  148 QRS Duration: 74 QT Interval:  354 QTC Calculation: 440 R Axis:   8 Text Interpretation:  Normal sinus rhythm Possible Left atrial enlargement  Borderline ECG No previous tracing Confirmed by POLLINA  MD, [place]  [id number]) on 11/01/2013 9:33:41 AM      MDM   Final diagnoses:  CVA (cerebral vascular accident)   Patient presents to the ER for evaluation of right arm weakness. Symptoms began yesterday evening, is not a candidate for thrombolytics based on timing. Examination revealed ataxia in the right upper extremity with preserved gross motor function. She has slight diminished in sensation. Patient does have a history of hypertension as well as diabetes. A screening CAT scan was performed and it does show evidence of a low attenuation area at the left frontal lobe which could be subacute infarct. This would fit the patient's current examination. It is therefore concerning for CVA which occurred yesterday.  Patient also complaining of blurred vision in the left eye. I do not think this is related to the new neurologic symptoms that began yesterday. When the patient covers her left eye, patient is normal. Covering the right eye reveals "darkened" lesion. This is a monocular vision change, not explained by central nervous system lesion.  Patient administered aspirin here in the ER. This was discussed with Doctor Leonel Ramsay, on-call for neurology. He agrees with transfer to [facility] for further evaluation.    Orpah Greek, MD 11/01/13 1120

## 2013-11-01 NOTE — ED Notes (Signed)
Patient changed into gown from waist up.

## 2013-11-02 ENCOUNTER — Inpatient Hospital Stay (HOSPITAL_COMMUNITY): Payer: No Typology Code available for payment source

## 2013-11-02 DIAGNOSIS — R221 Localized swelling, mass and lump, neck: Secondary | ICD-10-CM

## 2013-11-02 DIAGNOSIS — R220 Localized swelling, mass and lump, head: Secondary | ICD-10-CM

## 2013-11-02 DIAGNOSIS — I519 Heart disease, unspecified: Secondary | ICD-10-CM

## 2013-11-02 DIAGNOSIS — E1149 Type 2 diabetes mellitus with other diabetic neurological complication: Secondary | ICD-10-CM

## 2013-11-02 DIAGNOSIS — I63512 Cerebral infarction due to unspecified occlusion or stenosis of left middle cerebral artery: Secondary | ICD-10-CM

## 2013-11-02 DIAGNOSIS — K118 Other diseases of salivary glands: Secondary | ICD-10-CM

## 2013-11-02 LAB — GLUCOSE, CAPILLARY
Glucose-Capillary: 95 mg/dL (ref 70–99)
Glucose-Capillary: 196 mg/dL — ABNORMAL HIGH (ref 70–99)
Glucose-Capillary: 265 mg/dL — ABNORMAL HIGH (ref 70–99)
Glucose-Capillary: 123 mg/dL — ABNORMAL HIGH (ref 70–99)
Glucose-Capillary: 138 mg/dL — ABNORMAL HIGH (ref 70–99)

## 2013-11-02 LAB — HEMOGLOBIN A1C
Hgb A1c MFr Bld: 10.3 % — ABNORMAL HIGH (ref <5.7)
Hgb A1c MFr Bld: 10.5 % — ABNORMAL HIGH (ref <5.7)
Mean Plasma Glucose: 249 mg/dL — ABNORMAL HIGH (ref <117)
Mean Plasma Glucose: 255 mg/dL — ABNORMAL HIGH (ref <117)

## 2013-11-02 LAB — LIPID PANEL
Cholesterol: 221 mg/dL — ABNORMAL HIGH (ref 0–200)
HDL: 36 mg/dL — ABNORMAL LOW (ref >39)
LDL Cholesterol: 147 mg/dL — ABNORMAL HIGH (ref 0–99)
Total CHOL/HDL Ratio: 6.1 RATIO
Triglycerides: 190 mg/dL — ABNORMAL HIGH (ref <150)
VLDL: 38 mg/dL (ref 0–40)

## 2013-11-02 MED ORDER — ATORVASTATIN CALCIUM 40 MG PO TABS
40.0000 mg | ORAL_TABLET | Freq: Every day | ORAL | Status: DC
  Administered 2013-11-02 – 2013-11-05 (×4): 40 mg via ORAL
  Filled 2013-11-02 (×5): qty 1

## 2013-11-02 MED ORDER — ACETAMINOPHEN 325 MG PO TABS
650.0000 mg | ORAL_TABLET | Freq: Four times a day (QID) | ORAL | Status: DC | PRN
  Administered 2013-11-02 – 2013-11-03 (×3): 650 mg via ORAL
  Filled 2013-11-02 (×3): qty 2

## 2013-11-02 NOTE — Clinical Social Work Note (Signed)
CSW received referral from RN stating that pt has concerns for health insurance and obtaining long-term medication needs. CSW placed a referral with financial counseling services. CSW notified RN to consult with RNCM regarding pt's need for long-term medication. CSW signing off.  Pati Gallo, [place] Social Worker [phone]

## 2013-11-02 NOTE — Progress Notes (Signed)
[facility] PROGRESS NOTE  Sonia Skinner [id number] DOB: 08/05/1951 DOA: 11/01/2013 PCP: Angelica Chessman, MD  Assessment/Plan: Acute left MCA infarct -MRI brain shows patchy acute left MCA infarct -MRI of brain suggests possible left ICA stenosis in the cavernous segment versus left neck -Start with carotid ultrasound; may need MRA neck -Await echocardiogram -Hemoglobin A1c 10.3 -LDL 147 -PT/OT -Appreciate neurology followup -continue ASA 325mg  Left parotid nodule -Appears suspicious on MRI-17 mm incidental finding -Discussed with ENT, Dr. Constance Holster -followup appt made for 11/12/13@130PM  Hypertension  -Continue amlodipine  -Restart lisinopril  Diabetes mellitus type 2 -Half home dose of Lantus -Patient had mild episode of hypoglycemia on 11/01/2013 which may have been partly due to the patient's glipizide  -NovoLog sliding scale Tobacco abuse  -Tobacco cessation discussed  Hypothyroidism  -Continue Synthroid  hyperlipidemia  -Start Lipitor   Family Communication:   Pt at beside Disposition Plan:   Home when medically stable      Procedures/Studies: Dg Chest 2 View  11/01/2013   CLINICAL DATA:  Stroke.  Smoker.  EXAM: CHEST  2 VIEW  COMPARISON:  None.  FINDINGS: Lungs are clear. Heart size is normal. No pneumothorax or pleural effusion.  IMPRESSION: Negative chest.   Electronically Signed   By: Inge Rise M.D.   On: 11/01/2013 21:33   Ct Head Wo Contrast  11/01/2013   CLINICAL DATA:  Right-sided weakness for 2 days  EXAM: CT HEAD WITHOUT CONTRAST  TECHNIQUE: Contiguous axial images were obtained from the base of the skull through the vertex without intravenous contrast.  COMPARISON:  None.  FINDINGS: There is no evidence of mass effect, midline shift or extra-axial fluid collections. There is no evidence of a space-occupying lesion or intracranial hemorrhage. There is an area of low-attenuation at the gray-white junction in the left frontal lobe superiorly  (image 22) which may reflect volume averaging versus a subacute infarct. There are no other areas of concerning for cortical infarction.  The ventricles and sulci are appropriate for the patient's age. The basal cisterns are patent.  Visualized portions of the orbits are unremarkable. There is mild sphenoid sinus mucosal thickening.  The osseous structures are unremarkable.  IMPRESSION: There is an area of low-attenuation at the gray-white junction in the left frontal lobe superiorly (image 22) which may reflect volume averaging versus a subacute infarct.   Electronically Signed   By: Kathreen Devoid   On: 11/01/2013 10:10   Mr Brain Wo Contrast  11/02/2013   CLINICAL DATA:  63 year old female with hypertension, diabetes, visual disturbances on the left with right side weakness. Right upper extremity weakness. Initial encounter.  EXAM: MRI HEAD WITHOUT CONTRAST  MRA HEAD WITHOUT CONTRAST  TECHNIQUE: Multiplanar, multiecho pulse sequences of the brain and surrounding structures were obtained without intravenous contrast. Angiographic images of the head were obtained using MRA technique without contrast.  COMPARISON:  Head CT without contrast 10/1913.  FINDINGS: MRI HEAD FINDINGS  Scattered mostly subcentimeter areas of restricted diffusion in the left MCA territory. Lesions involving from the left superior temporal gyrus (series 5, image 14), to the left periatrial white matter, to the sensory cortex and motor strip subcortical white matter (more confluent, up to 12 mm series 5, image 25). Associated mild T2 and FLAIR hyperintensity. No associated mass effect. No evidence of associated hemorrhage.  No right hemisphere or posterior fossa restricted diffusion. Major intracranial vascular flow voids are preserved. Outside of the affected areas, gray and white matter signal is within normal limits. No chronic  infarcts are identified.  No ventriculomegaly. No midline shift, mass effect, or evidence of intracranial mass  lesion. Negative pituitary, cervicomedullary junction visualized cervical spine. Visible internal auditory structures appear normal. Mastoids are clear.  Small left sphenoid sinus mucous retention cyst. Minor ethmoid sinus mucosal thickening. Visualized orbit soft tissues are within normal limits. Normal bone marrow signal. Visualized scalp soft tissues are within normal limits.  In the left parotid space there is a 17 mm nodule with subtle dark T2 signal (series 11, image 14, and mildly restricted diffusion (series 6, image 15).  MRA HEAD FINDINGS  Antegrade flow in the posterior circulation with dominant distal left vertebral artery. Normal PICA origins. Normal vertebrobasilar junction. No basilar stenosis. SCA and PCA origins are normal. Bilateral PCA branches are normal. Posterior communicating arteries are diminutive or absent.  Antegrade flow in the right ICA siphon which is irregular and appears up to moderately stenosed in the cavernous segment (series 3, image 72). There is also a small laterally directed cavernous aneurysm or ulcerated plaque (series 3, image 72 long arrow). The supra clinoid right ICA is patent. The right ICA terminus is normally patent.  Decreased antegrade flow in the left ICA siphon from the distal cervical levels. Moderate to severe irregularity in the left cavernous segment might be the source of hemodynamically significant stenosis (series 305, image 5). Nonetheless, the left supra clinoid ICA and terminus remain patent.  Normal right MCA and ACA origins and branches. Anterior communicating artery is normal.  On the left there is decreased flow signal throughout the left MCA branches, but no major branch occlusion or focal stenosis identified. Left ACA flow signal appears to be more symmetric.  IMPRESSION: 1. Scattered and patchy acute left MCA infarcts. No associated mass effect or hemorrhage. 2. MRA appearance indicating hemodynamically significant stenosis of the left ICA. The  causative lesion might be in the left cavernous segment, or alternatively could be upstream in the left neck. 3. Subsequently decreased flow signal at the left ICA terminus and in the left MCA branches, but no major circle of Willis branch occlusion. 4. There is also extensive atherosclerosis of the right ICA cavernous segment with at least moderate stenosis on that side. 5. Unexpected finding of 17 mm left parotid space nodule with suspicious MRI characteristics. 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 Recommend nonurgent ENT followup once the patient recovers from the acute events.  Study discussed by telephone with Dr. Shanon Brow Buster Schueller On 11/02/2013 at 10:58 .   Electronically Signed   By: Lars Pinks M.D.   On: 11/02/2013 11:10   Mr Jodene Nam Head/brain Wo Cm  11/02/2013   CLINICAL DATA:  63 year old female with hypertension, diabetes, visual disturbances on the left with right side weakness. Right upper extremity weakness. Initial encounter.  EXAM: MRI HEAD WITHOUT CONTRAST  MRA HEAD WITHOUT CONTRAST  TECHNIQUE: Multiplanar, multiecho pulse sequences of the brain and surrounding structures were obtained without intravenous contrast. Angiographic images of the head were obtained using MRA technique without contrast.  COMPARISON:  Head CT without contrast 10/1913.  FINDINGS: MRI HEAD FINDINGS  Scattered mostly subcentimeter areas of restricted diffusion in the left MCA territory. Lesions involving from the left superior temporal gyrus (series 5, image 14), to the left periatrial white matter, to the sensory cortex and motor strip subcortical white matter (more confluent, up to 12 mm series 5, image 25). Associated mild T2 and FLAIR hyperintensity. No associated mass effect. No evidence of associated hemorrhage.  No right hemisphere or  posterior fossa restricted diffusion. Major intracranial vascular flow voids are preserved. Outside of the affected areas, gray and white matter signal is within normal limits. No  chronic infarcts are identified.  No ventriculomegaly. No midline shift, mass effect, or evidence of intracranial mass lesion. Negative pituitary, cervicomedullary junction visualized cervical spine. Visible internal auditory structures appear normal. Mastoids are clear.  Small left sphenoid sinus mucous retention cyst. Minor ethmoid sinus mucosal thickening. Visualized orbit soft tissues are within normal limits. Normal bone marrow signal. Visualized scalp soft tissues are within normal limits.  In the left parotid space there is a 17 mm nodule with subtle dark T2 signal (series 11, image 14, and mildly restricted diffusion (series 6, image 15).  MRA HEAD FINDINGS  Antegrade flow in the posterior circulation with dominant distal left vertebral artery. Normal PICA origins. Normal vertebrobasilar junction. No basilar stenosis. SCA and PCA origins are normal. Bilateral PCA branches are normal. Posterior communicating arteries are diminutive or absent.  Antegrade flow in the right ICA siphon which is irregular and appears up to moderately stenosed in the cavernous segment (series 3, image 72). There is also a small laterally directed cavernous aneurysm or ulcerated plaque (series 3, image 72 long arrow). The supra clinoid right ICA is patent. The right ICA terminus is normally patent.  Decreased antegrade flow in the left ICA siphon from the distal cervical levels. Moderate to severe irregularity in the left cavernous segment might be the source of hemodynamically significant stenosis (series 305, image 5). Nonetheless, the left supra clinoid ICA and terminus remain patent.  Normal right MCA and ACA origins and branches. Anterior communicating artery is normal.  On the left there is decreased flow signal throughout the left MCA branches, but no major branch occlusion or focal stenosis identified. Left ACA flow signal appears to be more symmetric.  IMPRESSION: 1. Scattered and patchy acute left MCA infarcts. No  associated mass effect or hemorrhage. 2. MRA appearance indicating hemodynamically significant stenosis of the left ICA. The causative lesion might be in the left cavernous segment, or alternatively could be upstream in the left neck. 3. Subsequently decreased flow signal at the left ICA terminus and in the left MCA branches, but no major circle of Willis branch occlusion. 4. There is also extensive atherosclerosis of the right ICA cavernous segment with at least moderate stenosis on that side. 5. Unexpected finding of 17 mm left parotid space nodule with suspicious MRI characteristics. 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 Recommend nonurgent ENT followup once the patient recovers from the acute events.  Study discussed by telephone with Dr. Shanon Brow Deitrich Steve On 11/02/2013 at 10:58 .   Electronically Signed   By: Lars Pinks M.D.   On: 11/02/2013 11:10         Subjective:  patient states that her left visual field impairment is unchanged but right arm feels a little stronger. Denies fevers, chills, chest pain, shortness breath, nausea, vomiting, diarrhea. Patient denies any odynophagia, mouth pain, face pain, dysuria, hematuria.  Objective: Filed Vitals:   11/02/13 0146 11/02/13 0340 11/02/13 0530 11/02/13 0903  BP: 102/62 140/78 122/73 137/71  Pulse: 74 79 69 86  Temp: 98.4 F (36.9 C) 98.4 F (36.9 C) 98 F (36.7 C) 98.7 F (37.1 C)  TempSrc: Oral Oral Oral Oral  Resp: 18 16 18 18   Height:      Weight:      SpO2: 98% 96% 95% 95%    Intake/Output Summary (Last 24 hours) at 11/02/13  1220 Last data filed at 11/02/13 0903  Gross per 24 hour  Intake    360 ml  Output    600 ml  Net   -240 ml   Weight change:  Exam:   General:  Pt is alert, follows commands appropriately, not in acute distress  HEENT: No icterus, No thrush, shotty anterior cervical lymphadenopathy  [place]/AT  Cardiovascular: RRR, S1/S2, no rubs, no gallops  Respiratory: CTA bilaterally, no wheezing, no  crackles, no rhonchi  Abdomen: Soft/+BS, non tender, non distended, no guarding  Extremities: No edema, No lymphangitis, No petechiae, No rashes, no synovitis  Data Reviewed: Basic Metabolic Panel:  Recent Labs Lab 11/01/13 0930 11/01/13 2127  NA 143  --   K 3.4*  --   CL 101  --   CO2 25  --   GLUCOSE 127*  --   BUN 9  --   CREATININE 0.90 0.88  CALCIUM 10.3  --    Liver Function Tests:  Recent Labs Lab 11/01/13 0930  AST 14  ALT 15  ALKPHOS 162*  BILITOT <0.2*  PROT 8.2  ALBUMIN 4.3   No results found for this basename: LIPASE, AMYLASE,  in the last 168 hours No results found for this basename: AMMONIA,  in the last 168 hours CBC:  Recent Labs Lab 11/01/13 0930 11/01/13 2127  WBC 12.6* 11.8*  NEUTROABS 7.8*  --   HGB 13.6 11.8*  HCT 41.1 36.1  MCV 81.7 81.5  PLT 213 186   Cardiac Enzymes:  Recent Labs Lab 11/01/13 0930  TROPONINI <0.30   BNP: No components found with this basename: POCBNP,  CBG:  Recent Labs Lab 11/01/13 1400 11/01/13 1552 11/01/13 2126 11/02/13 0644 11/02/13 1124  GLUCAP 187* 93 185* 95 196*    No results found for this or any previous visit (from the past 240 hour(s)).   Scheduled Meds: . amLODipine  10 mg Oral Daily  . aspirin  300 mg Rectal Daily   Or  . aspirin  325 mg Oral Daily  . buPROPion  300 mg Oral Daily  . heparin  5,000 Units Subcutaneous 3 times per day  . insulin aspart  0-15 Units Subcutaneous TID WC  . insulin aspart  0-5 Units Subcutaneous QHS  . insulin aspart  4 Units Subcutaneous TID WC  . insulin glargine  40 Units Subcutaneous QHS  . levothyroxine  150 mcg Oral QAC breakfast  . nicotine  14 mg Transdermal Daily  . pantoprazole  40 mg Oral Daily   Continuous Infusions:    Barclay Lennox, DO  [facility] Pager [phone]  If 7PM-7AM, please contact night-coverage [email or web address] Password [email or web address] 11/02/2013, 12:20 PM   LOS: 1 day

## 2013-11-02 NOTE — Progress Notes (Signed)
Pt reports numbness improved to right hand, but experiencing tingling.  There is no drift.

## 2013-11-02 NOTE — Evaluation (Signed)
Speech Language Pathology Evaluation Patient Details Name: Sonia Skinner MRN: [id number] DOB: 07/16/1951 Today's Date: 11/02/2013 Time: 1435-1450 SLP Time Calculation (min): 15 min  Problem List:  Patient Active Problem List   Diagnosis Date Noted  . Parotid mass 11/02/2013  . Acute ischemic left MCA stroke 11/02/2013  . Type II or unspecified type diabetes mellitus with neurological manifestations, not stated as uncontrolled 11/02/2013  . CVA (cerebral vascular accident) 11/01/2013  . TIA (transient ischemic attack) 11/01/2013  . Unspecified vitamin D deficiency 10/19/2013  . Tooth pain 07/31/2013  . Tobacco abuse 07/31/2013  . Essential hypertension, benign 04/20/2013  . Type II or unspecified type diabetes mellitus 04/20/2013  . Unspecified hypothyroidism 04/20/2013  . Other and unspecified hyperlipidemia 04/20/2013   Past Medical History:  Past Medical History  Diagnosis Date  . Hypertension   . Thyroid disease   . Complication of anesthesia     ALLERY TO ESTER BASE  . Diabetes mellitus     INSULIN DEPENDENT  . GERD (gastroesophageal reflux disease)    Past Surgical History:  Past Surgical History  Procedure Laterality Date  . Abdominal hysterectomy     HPI:  63 y/o female With past medical history of hypertension diabetes ongoing smoking that comes in visual disturbances on the left on with right-sided weakness this started the day prior to admission. MRI brain shows patchy acute left. MRI of brain suggests possible left ICA stenosis in the cavernous segment versus left neck MCA infarct.   Assessment / Plan / Recommendation Clinical Impression   Patient did not present with any deficits in areas of cognition, speech or language as per this evaluation. She states that she her vision in the left visual field appears "tinted, like when wearing sunglasses" and that it has gotten "a little bit better" since she first noticed this. No further f/u from Speech Language  Pathology, as patient appears to be functioning at baseline for cognitive-linguistic and speech abilities and is able to function independently in this regard.    SLP Assessment  Patient does not need any further Speech Lanaguage Pathology Services    Follow Up Recommendations  None    Frequency and Duration        Pertinent Vitals/Pain    SLP Goals     SLP Evaluation Prior Functioning  Cognitive/Linguistic Baseline: Within functional limits Type of Home: House Available Help at Discharge: Family;Available PRN/intermittently   Cognition  Overall Cognitive Status: Within Functional Limits for tasks assessed Orientation Level: Oriented X4    Comprehension  Auditory Comprehension Overall Auditory Comprehension: Appears within functional limits for tasks assessed    Expression Expression Primary Mode of Expression: Verbal Verbal Expression Overall Verbal Expression: Appears within functional limits for tasks assessed   Oral / Motor Oral Motor/Sensory Function Overall Oral Motor/Sensory Function: Appears within functional limits for tasks assessed Motor Speech Overall Motor Speech: Appears within functional limits for tasks assessed   GO     Dannial Monarch 11/02/2013, 5:48 PM  Sonia Baller, MA, CCC-SLP [facility] Speech-Language Pathologist

## 2013-11-02 NOTE — Progress Notes (Signed)
Bilateral carotid artery duplex completed.  Right:  1-39% ICA stenosis.  Left:  Greater than 80% internal carotid artery stenosis.  Bilateral:  Vertebral artery flow is antegrade.

## 2013-11-02 NOTE — Evaluation (Signed)
Physical Therapy Evaluation Patient Details Name: Sonia Skinner MRN: [id number] DOB: 08-01-1951 Today's Date: 11/02/2013 Time: [id number] PT Time Calculation (min): 14 min  PT Assessment / Plan / Recommendation History of Present Illness  63 y/o female With past medical history of hypertension diabetes ongoing smoking that comes in visual disturbances on the left on with right-sided weakness this started the day prior to admission  Clinical Impression  Pt at baseline for mobility with no significant deficits present.  Will d/c PT.    PT Assessment  Patent does not need any further PT services    Follow Up Recommendations  No PT follow up;Supervision - Intermittent    Does the patient have the potential to tolerate intense rehabilitation      Barriers to Discharge        Equipment Recommendations  None recommended by PT    Recommendations for Other Services     Frequency      Precautions / Restrictions Precautions Precautions: None Restrictions Weight Bearing Restrictions: No   Pertinent Vitals/Pain None reported      Mobility  Bed Mobility Overal bed mobility: Independent Transfers Overall transfer level: Independent Equipment used: None Ambulation/Gait Ambulation/Gait assistance: Independent Ambulation Distance (Feet): 300 Feet Assistive device: None Gait Pattern/deviations: WFL(Within Functional Limits) Gait velocity: WFL Gait velocity interpretation: at or above normal speed for age/gender General Gait Details: performed head turns without LOB Stairs: Yes Stairs assistance: Modified independent (Device/Increase time) Stair Management: One rail Left;Alternating pattern Number of Stairs: 2 Modified Rankin (Stroke Patients Only) Pre-Morbid Rankin Score: No symptoms Modified Rankin: No significant disability    Exercises     PT Diagnosis:    PT Problem List:   PT Treatment Interventions:       PT Goals(Current goals can be found in the care plan  section) Acute Rehab PT Goals Patient Stated Goal: to return to work PT Goal Formulation: No goals set, d/c therapy  Visit Information  Last PT Received On: 11/02/13 Assistance Needed: +1 History of Present Illness: 63 y/o female With past medical history of hypertension diabetes ongoing smoking that comes in visual disturbances on the left on with right-sided weakness this started the day prior to admission       Prior [place] expects to be discharged to:: Private residence Living Arrangements: Other relatives (sister) Available Help at Discharge: Family;Available PRN/intermittently Type of Home: House Home Access: Stairs to enter [employer] of Steps: 4 Entrance Stairs-Rails: Left Home Layout: One level Home Equipment: None Prior Function Level of Independence: Independent Communication Communication: No difficulties Dominant Hand: Right    Cognition  Cognition Arousal/Alertness: Awake/alert Behavior During Therapy: WFL for tasks assessed/performed Overall Cognitive Status: Within Functional Limits for tasks assessed    Extremity/Trunk Assessment Upper Extremity Assessment Upper Extremity Assessment: Defer to OT evaluation Lower Extremity Assessment Lower Extremity Assessment: Overall WFL for tasks assessed Cervical / Trunk Assessment Cervical / Trunk Assessment: Normal   Balance Balance Overall balance assessment: Independent  End of Session PT - End of Session Equipment Utilized During Treatment: Gait belt Activity Tolerance: Patient tolerated treatment well Patient left: in bed;with call bell/phone within reach Nurse Communication: Mobility status  GP     Faustino Congress K 11/02/2013, 1:10 PM  Laureen Abrahams, PT, DPT [phone]

## 2013-11-02 NOTE — Progress Notes (Signed)
Echocardiogram 2D Echocardiogram has been performed.  Sonia Skinner 11/02/2013, 2:04 PM

## 2013-11-02 NOTE — Evaluation (Signed)
Occupational Therapy Evaluation Patient Details Name: Sonia Skinner MRN: [id number] DOB: 1950-12-29 Today's Date: 11/02/2013 Time: [id number] OT Time Calculation (min): 18 min  OT Assessment / Plan / Recommendation History of present illness 63 y/o female With past medical history of hypertension diabetes ongoing smoking that comes in visual disturbances on the left on with right-sided weakness this started the day prior to admission. MRI brain shows patchy acute left. MRI of brain suggests possible left ICA stenosis in the cavernous segment versus left neck MCA infarct.    Clinical Impression   Pt admitted with above. Pt c/o continued Left eye blurriness, denies double vision.  Also presents with RUE deficits.  Recommend OPOT to further progress rehab.  Pt may also benefit from consult with opthalmologist concerning her left eye.  Will follow pt acutely in order to address below problem list.     OT Assessment  Patient needs continued OT Services    Follow Up Recommendations  Outpatient OT    Barriers to Discharge      Equipment Recommendations  None recommended by OT    Recommendations for Other Services  (opthalmologist)  Frequency  Min 2X/week    Precautions / Restrictions Precautions Precautions: None Restrictions Weight Bearing Restrictions: No   Pertinent Vitals/Pain See vitals    ADL  Lower Body Dressing: Performed;Modified independent Where Assessed - Lower Body Dressing: Unsupported sit to stand Toilet Transfer: Simulated;Independent Toilet Transfer Method: Sit to Loss adjuster, chartered:  (ambulating to chair) Transfers/Ambulation Related to ADLs: independent ADL Comments: Pt with slightly decreased fine motor coordination in RUE.  Reports incr effort to perform tasks with RUE.  Educated pt on single digit lift and finger-thumb opposition activities to help with improving coordination.  Pt c/o L eye blurriness.  OT recommended to pt that she avoid driving  due to visual deficits until cleared by MD.     OT Diagnosis: Disturbance of vision;Paresis  OT Problem List: Decreased coordination;Impaired vision/perception OT Treatment Interventions: Self-care/ADL training;Visual/perceptual remediation/compensation;Patient/family education;Therapeutic activities   OT Goals(Current goals can be found in the care plan section) Acute Rehab OT Goals Patient Stated Goal: to return to work OT Goal Formulation: With patient Time For Goal Achievement: 11/09/13 Potential to Achieve Goals: Good  Visit Information  Last OT Received On: 11/02/13 Assistance Needed: +1 History of Present Illness: 63 y/o female With past medical history of hypertension diabetes ongoing smoking that comes in visual disturbances on the left on with right-sided weakness this started the day prior to admission       Prior [place] expects to be discharged to:: Private residence Living Arrangements: Other relatives (sister) Available Help at Discharge: Family;Available PRN/intermittently Type of Home: House Home Access: Stairs to enter [employer] of Steps: 4 Entrance Stairs-Rails: Left Home Layout: One level Home Equipment: None Prior Function Level of Independence: Independent Comments: Works as a Oceanographer. Communication Communication: No difficulties Dominant Hand: Right         Vision/Perception Vision - History Baseline Vision: Wears glasses only for reading Patient Visual Report: Blurring of vision (Left eye) Vision - Assessment Vision Assessment: Vision tested Tracking/Visual Pursuits: Able to track stimulus in all quads without difficulty Additional Comments: Pt denies double vision but reports blurriness in left eye. She describes her vision in left eye as if she is wearing various shades of sun glasses. She is able to read paragraphs but c/o blurriness.  Blurriness does not disappear with right eye  covered.  Will continue to assess.     Cognition  Cognition Arousal/Alertness: Awake/alert Behavior During Therapy: WFL for tasks assessed/performed Overall Cognitive Status: Within Functional Limits for tasks assessed    Extremity/Trunk Assessment Upper Extremity Assessment Upper Extremity Assessment: RUE deficits/detail RUE Sensation: decreased light touch;decreased proprioception RUE Coordination: decreased fine motor Lower Extremity Assessment Lower Extremity Assessment: Overall WFL for tasks assessed Cervical / Trunk Assessment Cervical / Trunk Assessment: Normal     Mobility Bed Mobility Overal bed mobility: Independent Transfers Overall transfer level: Independent Equipment used: None     Exercise     Balance Balance Overall balance assessment: Independent   End of Session OT - End of Session Activity Tolerance: Patient tolerated treatment well Patient left: in chair;with call bell/phone within reach (with MD)  GO    11/02/2013 Darrol Jump OTR/L Pager [phone] Office [phone]  Darrol Jump 11/02/2013, 1:37 PM

## 2013-11-02 NOTE — Progress Notes (Signed)
Inpatient Diabetes Program Recommendations  AACE/ADA: New Consensus Statement on Inpatient Glycemic Control (2013)  Target Ranges:  Prepandial:   less than 140 mg/dL      Peak postprandial:   less than 180 mg/dL (1-2 hours)      Critically ill patients:  140 - 180 mg/dL   Results for Sonia Skinner, Sonia Skinner (MRN [id number]) as of 11/02/2013 13:49  Ref. Range 11/01/2013 21:27  Hemoglobin A1C Latest Range: <5.7 % 10.3 (H)   Will need follow-up with primary MD for glycemic management.  Thank you  Raoul Pitch BSN, RN,CDE Inpatient Diabetes Coordinator [phone] (team pager)

## 2013-11-02 NOTE — Progress Notes (Signed)
Stroke Team Progress Note  HISTORY Sonia Skinner is a 63 y.o. female with a history of hypertension and diabetes mellitus presenting with a complaint of intermittent blurring of vision involving left eye for about one week, and new onset weakness involving right upper extremity. Patient woke up at midnight 11/01/2013 with right arm weakness. She had no symptoms when she went to bed at 9 PM.on 10/31/2013. CT scan of her head showed no acute intracranial abnormality except for possible abnormal density involving the left frontal lobe superiorly, suggestive of possible subacute stroke. Patient had not been on antiplatelet therapy. Had no previous history of stroke nor TIA. NIH stroke score was 1 for pronation and drift of right upper extremity.   LSN: 9 PM on 10/31/2013  tPA Given: No: Mild deficits; beyond time window for treatment consideration.  MRankin: 0  .  SUBJECTIVE There were no family members present. The patient complained of blurred vision in her left eye.  OBJECTIVE Most recent Vital Signs: Filed Vitals:   11/02/13 0018 11/02/13 0146 11/02/13 0340 11/02/13 0530  BP: 133/69 102/62 140/78 122/73  Pulse: 78 74 79 69  Temp: 98.4 F (36.9 C) 98.4 F (36.9 C) 98.4 F (36.9 C) 98 F (36.7 C)  TempSrc: Oral Oral Oral Oral  Resp: 18 18 16 18   Height:      Weight:      SpO2: 97% 98% 96% 95%   CBG (last 3)   Recent Labs  11/01/13 1552 11/01/13 2126 11/02/13 0644  GLUCAP 93 185* 95    IV Fluid Intake:     MEDICATIONS  . amLODipine  10 mg Oral Daily  . aspirin  300 mg Rectal Daily   Or  . aspirin  325 mg Oral Daily  . buPROPion  300 mg Oral Daily  . heparin  5,000 Units Subcutaneous 3 times per day  . insulin aspart  0-15 Units Subcutaneous TID WC  . insulin aspart  0-5 Units Subcutaneous QHS  . insulin aspart  4 Units Subcutaneous TID WC  . insulin glargine  40 Units Subcutaneous QHS  . levothyroxine  150 mcg Oral QAC breakfast  . nicotine  14 mg Transdermal Daily   . pantoprazole  40 mg Oral Daily  . pneumococcal 23 valent vaccine  0.5 mL Intramuscular Tomorrow-1000   PRN:  senna-docusate  Diet:    Heart healthy carb modified diet with thin liquids Activity:  up with assistance DVT Prophylaxis:  Subcutaneous heparin  CLINICALLY SIGNIFICANT STUDIES Basic Metabolic Panel:   Recent Labs Lab 11/01/13 0930 11/01/13 2127  NA 143  --   K 3.4*  --   CL 101  --   CO2 25  --   GLUCOSE 127*  --   BUN 9  --   CREATININE 0.90 0.88  CALCIUM 10.3  --    Liver Function Tests:   Recent Labs Lab 11/01/13 0930  AST 14  ALT 15  ALKPHOS 162*  BILITOT <0.2*  PROT 8.2  ALBUMIN 4.3   CBC:   Recent Labs Lab 11/01/13 0930 11/01/13 2127  WBC 12.6* 11.8*  NEUTROABS 7.8*  --   HGB 13.6 11.8*  HCT 41.1 36.1  MCV 81.7 81.5  PLT 213 186   Coagulation:   Recent Labs Lab 11/01/13 0930  LABPROT 12.5  INR 0.95   Cardiac Enzymes:   Recent Labs Lab 11/01/13 0930  TROPONINI <0.30   Urinalysis:   Recent Labs Lab 11/01/13 0945  COLORURINE YELLOW  LABSPEC 1.004*  PHURINE 6.0  GLUCOSEU NEGATIVE  HGBUR NEGATIVE  BILIRUBINUR NEGATIVE  KETONESUR NEGATIVE  PROTEINUR NEGATIVE  UROBILINOGEN 0.2  NITRITE NEGATIVE  LEUKOCYTESUR TRACE*   Lipid Panel    Component Value Date/Time   CHOL 221* 11/02/2013 0225   TRIG 190* 11/02/2013 0225   HDL 36* 11/02/2013 0225   CHOLHDL 6.1 11/02/2013 0225   VLDL 38 11/02/2013 0225   LDLCALC 147* 11/02/2013 0225   HgbA1C  Lab Results  Component Value Date   HGBA1C 10.3* 11/01/2013    Urine Drug Screen:     Component Value Date/Time   LABOPIA NONE DETECTED 11/01/2013 0945   COCAINSCRNUR NONE DETECTED 11/01/2013 0945   LABBENZ NONE DETECTED 11/01/2013 0945   AMPHETMU NONE DETECTED 11/01/2013 0945   THCU NONE DETECTED 11/01/2013 0945   LABBARB NONE DETECTED 11/01/2013 0945    Alcohol Level:   Recent Labs Lab 11/01/13 0930  ETH <11    Dg Chest 2 View 11/01/2013    Negative chest.     Ct Head Wo  Contrast 11/01/2013    There is an area of low-attenuation at the gray-white junction in the left frontal lobe superiorly (image 22) which may reflect volume averaging versus a subacute infarct.    MRI / MRA of the brain   1. Scattered and patchy acute left MCA infarcts. No associated mass effect or hemorrhage. 2. MRA appearance indicating hemodynamically significant stenosis of the left ICA. The causative lesion might be in the left cavernous segment, or alternatively could be upstream in the left neck. 3. Subsequently decreased flow signal at the left ICA terminus and in the left MCA branches, but no major circle of Willis branch occlusion. 4. There is also extensive atherosclerosis of the right ICA cavernous segment with at least moderate stenosis on that side. 5. Unexpected finding of 17 mm left parotid space nodule with suspicious MRI characteristics. 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 Recommend nonurgent ENT followup once the patient recovers from the acute events.    2D Echocardiogram  pending  Carotid Doppler  pending  EKG normal sinus rhythm rate 93 beats per minute  For complete results please see formal report.   Therapy Recommendations - outpatient occupational therapy. No followup physical therapy recommended.  Physical Exam   Frail elderly african American lady not in distress.Awake alert. Afebrile. Head is nontraumatic. Neck is supple without bruit. Hearing is normal. Cardiac exam no murmur or gallop. Lungs are clear to auscultation. Distal pulses are well felt. Neurological Exam ; awake alert oriented x3 with normal speech and language function. Fundi were not visualized. Finger counting is adequate in the left eye but patient has subjective blurred vision and difficulty with reading fine print. Right eye vision appears normal. Visual fields appear normal at the bedside. Face is symmetric tongue is midline. Motor system exam no upper or lower eczema to  drift with weakness of the right grip and intrinsic hand muscles. Orbits left-to-right upper extremity. No lower extremity weakness. Gait was not tested. Sensation appears normal.  ASSESSMENT [name] is a 63 y.o. female presenting with blurred vision left eye with right upper extremity weakness. TPA was not given secondary to mild deficits and late presentation. An MRI revealed  scattered and patchy acute left MCA infarcts suspect watershed left ACA-MCA territory. Infarcts felt to be embolic secondary to proximal left internal carotid artery stenosis.  On no antithrombotic prior to admission. Now on aspirin 325 mg orally every day for secondary stroke prevention. Patient  with resultant right upper extremity weakness and blurred vision in the left eye. Stroke work up underway.   Tobacco history  Hyperlipidemia  Diabetes mellitus - hemoglobin A1c 10.3  Left parotid nodule - further evaluation recommended  Hypertension history   Hospital day # 1  TREATMENT/PLAN  Continue aspirin 325 mg orally every day for secondary stroke prevention.  Await carotid Dopplers and 2-D echo. Will likely need vascular surgery consult if proximal left ICA stenosis is confirmed.  Hyperlipidemia - Lipitor started - cholesterol 221 LDL 147  Followup the parotid nodule   Mikey Bussing PA-C [facility] Pager [phone] 11/02/2013, 8:02 AM  I have personally obtained a history, examined the patient, evaluated imaging results, and formulated the assessment and plan of care. I agree with the above. Antony Contras, MD   To contact Stroke Continuity provider, please refer to [email or web address] After hours, contact General Neurology

## 2013-11-02 NOTE — Progress Notes (Signed)
UR complete.  Lashawnda Hancox RN, MSN 

## 2013-11-02 NOTE — Clinical Documentation Improvement (Signed)
  Patient admitted with CVA with "right arm weakness" and "clumsiness of her right arm". On exam "ataxia with the right upper extremity fine motor movements such as finger to nose". Please clarify these nonspecific symptoms to better illustrate the severity of illness and risk of mortality. Thank you.  Possible Clinical Conditions? - Right hemiparesis   - Other Condition (please specify)    Thank You, Ezekiel Ina ,RN Clinical Documentation Specialist:  [phone]  [facility] [employer]

## 2013-11-03 ENCOUNTER — Inpatient Hospital Stay (HOSPITAL_COMMUNITY): Payer: No Typology Code available for payment source

## 2013-11-03 ENCOUNTER — Encounter (HOSPITAL_COMMUNITY): Payer: Self-pay | Admitting: Radiology

## 2013-11-03 DIAGNOSIS — R220 Localized swelling, mass and lump, head: Secondary | ICD-10-CM

## 2013-11-03 DIAGNOSIS — G459 Transient cerebral ischemic attack, unspecified: Secondary | ICD-10-CM

## 2013-11-03 DIAGNOSIS — R221 Localized swelling, mass and lump, neck: Secondary | ICD-10-CM

## 2013-11-03 DIAGNOSIS — Z8673 Personal history of transient ischemic attack (TIA), and cerebral infarction without residual deficits: Secondary | ICD-10-CM

## 2013-11-03 DIAGNOSIS — R51 Headache: Secondary | ICD-10-CM

## 2013-11-03 DIAGNOSIS — I6350 Cerebral infarction due to unspecified occlusion or stenosis of unspecified cerebral artery: Secondary | ICD-10-CM

## 2013-11-03 LAB — GLUCOSE, CAPILLARY
Glucose-Capillary: 145 mg/dL — ABNORMAL HIGH (ref 70–99)
Glucose-Capillary: 189 mg/dL — ABNORMAL HIGH (ref 70–99)
Glucose-Capillary: 204 mg/dL — ABNORMAL HIGH (ref 70–99)
Glucose-Capillary: 137 mg/dL — ABNORMAL HIGH (ref 70–99)
Glucose-Capillary: 97 mg/dL (ref 70–99)

## 2013-11-03 MED ORDER — INSULIN GLARGINE 100 UNIT/ML ~~LOC~~ SOLN
44.0000 [IU] | Freq: Every day | SUBCUTANEOUS | Status: DC
  Administered 2013-11-03 – 2013-11-05 (×2): 44 [IU] via SUBCUTANEOUS
  Filled 2013-11-03 (×4): qty 0.44

## 2013-11-03 MED ORDER — SODIUM CHLORIDE 0.9 % IV SOLN
INTRAVENOUS | Status: DC
  Administered 2013-11-03: 17:00:00 via INTRAVENOUS

## 2013-11-03 MED ORDER — IOHEXOL 350 MG/ML SOLN
50.0000 mL | Freq: Once | INTRAVENOUS | Status: AC | PRN
  Administered 2013-11-03: 50 mL via INTRAVENOUS

## 2013-11-03 MED ORDER — HEPARIN (PORCINE) IN NACL 100-0.45 UNIT/ML-% IJ SOLN
900.0000 [IU]/h | INTRAMUSCULAR | Status: DC
  Administered 2013-11-03: 900 [IU]/h via INTRAVENOUS
  Filled 2013-11-03: qty 250

## 2013-11-03 MED ORDER — CEFAZOLIN SODIUM 1-5 GM-% IV SOLN
1.0000 g | INTRAVENOUS | Status: AC
  Administered 2013-11-04: 1 g via INTRAVENOUS
  Filled 2013-11-03 (×2): qty 50

## 2013-11-03 MED ORDER — LISINOPRIL 5 MG PO TABS
5.0000 mg | ORAL_TABLET | Freq: Every day | ORAL | Status: DC
  Administered 2013-11-03: 5 mg via ORAL
  Filled 2013-11-03 (×2): qty 1

## 2013-11-03 MED ORDER — HYDROCODONE-ACETAMINOPHEN 5-325 MG PO TABS
1.0000 | ORAL_TABLET | Freq: Four times a day (QID) | ORAL | Status: DC | PRN
  Administered 2013-11-04 – 2013-11-05 (×4): 1 via ORAL
  Filled 2013-11-03 (×8): qty 1

## 2013-11-03 NOTE — Consult Note (Addendum)
[facility]  Patient name: Sonia Skinner MRN: [id number] DOB: May 04, 1951 Sex: female  REASON FOR CONSULT: Left Brain Stroke with Left carotid stenosis. Consult from [facility]  HPI: Sonia Skinner is a 63 y.o. female who was admitted on 11/01/13 with right sided weakness and amaurosis fugax of the left eye. She developed the sudden onset of paresthesias and weakness in the left upper extremity on Thursday morning. Her symptoms have gradually improved. She states that the arm still feels slightly sluggish but that her strength has for the most part returned. She had no lower extremity symptoms. She had no expressive or receptive aphasia. She had been having visual disturbances in her left eye for the last several days prior to this in which her vision would become dark. She is unaware of any prior history of stroke, TIAs, expressive or receptive aphasia, or amaurosis fugax. She states that she was not on aspirin prior to this event. She has been started on aspirin and has been started on a statin.  Her risk factors for peripheral vascular disease include hypertension, tobacco use, and a family history of premature cardiovascular disease.  Past Medical History  Diagnosis Date  . Hypertension   . Thyroid disease   . Complication of anesthesia     ALLERY TO ESTER BASE  . Diabetes mellitus     INSULIN DEPENDENT  . GERD (gastroesophageal reflux disease)    Family History  Problem Relation Age of Onset  . Diabetes Mother   . Heart disease Mother   . Heart disease Father   . Hypertension Sister   . Diabetes Sister    SOCIAL HISTORY: History  Substance Use Topics  . Smoking status: Current Every Day Smoker -- 0.20 packs/day for 20 years    Types: Cigarettes  . Smokeless tobacco: Never Used  . Alcohol Use: No   Allergies  Allergen Reactions  . Anesthetics, Ester Anaphylaxis   Current Facility-Administered Medications  Medication Dose Route Frequency  Provider Last Rate Last Dose  . acetaminophen (TYLENOL) tablet 650 mg  650 mg Oral Q6H PRN Ritta Slot, NP   650 mg at 11/03/13 1036  . amLODipine (NORVASC) tablet 10 mg  10 mg Oral Daily Charlynne Cousins, MD   10 mg at 11/03/13 1036  . aspirin suppository 300 mg  300 mg Rectal Daily Charlynne Cousins, MD       Or  . aspirin tablet 325 mg  325 mg Oral Daily Charlynne Cousins, MD   325 mg at 11/03/13 1037  . atorvastatin (LIPITOR) tablet 40 mg  40 mg Oral q1800 Orson Eva, MD   40 mg at 11/02/13 1720  . buPROPion (WELLBUTRIN XL) 24 hr tablet 300 mg  300 mg Oral Daily Charlynne Cousins, MD   300 mg at 11/03/13 1037  . heparin injection 5,000 Units  5,000 Units Subcutaneous 3 times per day Charlynne Cousins, MD   5,000 Units at 11/03/13 0525  . HYDROcodone-acetaminophen (NORCO/VICODIN) 5-325 MG per tablet 1 tablet  1 tablet Oral Q6H PRN David L Rinehuls, PA-C      . insulin aspart (novoLOG) injection 0-15 Units  0-15 Units Subcutaneous TID WC Charlynne Cousins, MD   5 Units at 11/03/13 1232  . insulin aspart (novoLOG) injection 0-5 Units  0-5 Units Subcutaneous QHS Charlynne Cousins, MD      . insulin aspart (novoLOG) injection 4 Units  4 Units Subcutaneous TID WC Bess Harvest  Olevia Bowens, MD   4 Units at 11/03/13 1233  . insulin glargine (LANTUS) injection 40 Units  40 Units Subcutaneous QHS Charlynne Cousins, MD   40 Units at 11/02/13 2151  . levothyroxine (SYNTHROID, LEVOTHROID) tablet 150 mcg  150 mcg Oral QAC breakfast Charlynne Cousins, MD   150 mcg at 11/03/13 0724  . nicotine (NICODERM CQ - dosed in mg/24 hours) patch 14 mg  14 mg Transdermal Daily Charlynne Cousins, MD   14 mg at 11/03/13 1037  . pantoprazole (PROTONIX) EC tablet 40 mg  40 mg Oral Daily Charlynne Cousins, MD   40 mg at 11/03/13 1033  . senna-docusate (Senokot-S) tablet 1 tablet  1 tablet Oral QHS PRN Charlynne Cousins, MD       REVIEW OF SYSTEMS: Valu.Nieves ] denotes positive finding; [  ] denotes negative  finding CARDIOVASCULAR:  [ ]  chest pain   [ ]  chest pressure   [ ]  palpitations   [ ]  orthopnea   [ ]  dyspnea on exertion   [ ]  claudication   [ ]  rest pain   [ ]  DVT   [ ]  phlebitis PULMONARY:   [ ]  productive cough   [ ]  asthma   [ ]  wheezing NEUROLOGIC:   Valu.Nieves ] weakness  Valu.Nieves ] paresthesias  [ ]  aphasia  Valu.Nieves ] amaurosis  [ ]  dizziness HEMATOLOGIC:   [ ]  bleeding problems   [ ]  clotting disorders MUSCULOSKELETAL:  [ ]  joint pain   [ ]  joint swelling [ ]  leg swelling GASTROINTESTINAL: [ ]   blood in stool  [ ]   hematemesis GENITOURINARY:  [ ]   dysuria  [ ]   hematuria PSYCHIATRIC:  [ ]  history of major depression INTEGUMENTARY:  [ ]  rashes  [ ]  ulcers CONSTITUTIONAL:  [ ]  fever   [ ]  chills  PHYSICAL EXAM: Filed Vitals:   11/03/13 0120 11/03/13 0529 11/03/13 0931 11/03/13 1332  BP: 134/71 132/72 136/68 146/69  Pulse: 98 82 91 93  Temp: 97.8 F (36.6 C) 98.4 F (36.9 C) 98.4 F (36.9 C) 99.5 F (37.5 C)  TempSrc: Oral Oral Oral Oral  Resp: 18 18 18 18   Height:      Weight:      SpO2: 100% 99% 97% 98%   Body mass index is 26.7 kg/(m^2). GENERAL: The patient is a well-nourished female, in no acute distress. The vital signs are documented above. CARDIOVASCULAR: There is a regular rate and rhythm. She has bilateral carotid bruits. She has palpable radial, femoral, and dorsalis pedis pulses bilaterally. PULMONARY: There is good air exchange bilaterally without wheezing or rales. ABDOMEN: Soft and non-tender with normal pitched bowel sounds.  MUSCULOSKELETAL: There are no major deformities or cyanosis. NEUROLOGIC: she has very minimal right upper extremity weakness. She has no weakness on the left side and no lower extremity weakness. SKIN: There are no ulcers or rashes noted. PSYCHIATRIC: The patient has a normal affect.  DATA:  Lab Results  Component Value Date   WBC 11.8* 11/01/2013   HGB 11.8* 11/01/2013   HCT 36.1 11/01/2013   MCV 81.5 11/01/2013   PLT 186 11/01/2013   Lab Results   Component Value Date   NA 143 11/01/2013   K 3.4* 11/01/2013   CL 101 11/01/2013   CO2 25 11/01/2013   Lab Results  Component Value Date   CREATININE 0.88 11/01/2013   Lab Results  Component Value Date   INR 0.95 11/01/2013   Lab Results  Component Value Date   HGBA1C 10.5* 11/02/2013   CBG (last 3)   Recent Labs  11/03/13 0801 11/03/13 1147 11/03/13 1612  GLUCAP 189* 204* 137*   CAROTID DUPLEX: > 80% Left ICA stenosis; < 39% Right ICA stenosis. It is difficult to appreciate the thrombus from the duplex scan which I reviewed.  MRI/MRA: Scattered acute L MCA infarcts. No hemorrhage. Left cavernous segment stenosis and proximal L ICA stenosis. Also Right ICA cavernous segment stenosis.   CT ANGIO NECK: >80% narrowing of carotid bulb on the left, with question of thrombus tailing from this region.   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 EF 60-65%. No significant valvular disease.   EKG: NSR  MEDICAL ISSUES:  SYMPTOMATIC LEFT CAROTID STENOSIS: This patient presents with a left brain stroke. She has scattered acute infarcts in the left middle cerebral artery territory. She has a tight greater than 80% left internal carotid artery stenosis with possibly some thrombus trailing from the plaque. In addition, she does have disease of the left cavernous segment with likely significant stenosis. Given that she potentially has thrombus in the left carotid system I have recommended that we proceed with left carotid endarterectomy tomorrow morning. I am concerned that the thrombus could potentially embolize and resulted in another significant stroke. Although she has disease in the cavernous sinus, I do not think that cerebral arteriography and consideration for stenting would be wise given the thrombus potentially in the carotid bulb. Although the disease in the carotid siphon is a concern, the patient has  severe disease at the carotid bifurcation with thrombus and therefore I think this is the more pressing issue.  I've spoken with the Hospitalist and the patient will be heparinized tonight. The the heparin will be held in the morning prior to surgery. I have reviewed the indications for carotid endarterectomy, that is to lower the risk of future stroke. I have also reviewed the potential complications of surgery, including but not limited to: bleeding, stroke (perioperative risk 1-2%), MI, nerve injury of other unpredictable medical problems. All of the patients questions were answered and they are agreeable to proceed with surgery.   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 Constance Holster and he agrees that given the severity of the carotid disease the parotid workup is not urgent. This can be addressed electively when she is recovered from her stroke.  [place] [facility] [place] [facility]: [phone]

## 2013-11-03 NOTE — Progress Notes (Signed)
ANTICOAGULATION CONSULT NOTE - Initial Consult  Pharmacy Consult for Heparin Indication: Carotid thrombus  Allergies  Allergen Reactions  . Anesthetics, Ester Anaphylaxis   Patient Measurements: Height: 5\' 2"  (157.5 cm) Weight: 146 lb (66.225 kg) IBW/kg (Calculated) : 50.1  Vital Signs: Temp: 98.6 F (37 C) (03/21 1744) Temp src: Oral (03/21 1744) BP: 149/75 mmHg (03/21 1744) Pulse Rate: 94 (03/21 1744)  Labs:  Recent Labs  11/01/13 0930 11/01/13 2127  HGB 13.6 11.8*  HCT 41.1 36.1  PLT 213 186  APTT 31  --   LABPROT 12.5  --   INR 0.95  --   CREATININE 0.90 0.88  TROPONINI <0.30  --    Estimated Creatinine Clearance: 57.6 ml/min (by C-G formula based on Cr of 0.88).  Medical History: Past Medical History  Diagnosis Date  . Hypertension   . Thyroid disease   . Complication of anesthesia     ALLERY TO ESTER BASE  . Diabetes mellitus     INSULIN DEPENDENT  . GERD (gastroesophageal reflux disease)    Medications:  Infusions:  . sodium chloride 100 mL/hr at 11/03/13 1729  . heparin      Assessment: 63 yo female, s/p stroke and ICA with possible thrombus.  We have been asked to initiate IV heparin with plans for surgery in the morning.  She is without noted bleeding complications.  She has no history of bleeding disorders either and her CBC was stable on 3/19.  Goal of Therapy:  Heparin level 0.3-0.7 units/ml Monitor platelets by anticoagulation protocol: Yes   Plan:  1.  Will start IV heparin at 900 units/hr tonight and the plan to discontinue in the morning for surgery at [facility] - have discussed this with the patients nurse tonight. 2.  Will not check a heparin level since heparin will be cut off for OR. 3.  Will follow up for plans to resume after surgery.  Rober Minion, PharmD., MS Clinical Pharmacist Pager:  [phone] Thank you for allowing pharmacy to be part of this patients care team. 11/03/2013,5:57 PM

## 2013-11-03 NOTE — Progress Notes (Signed)
[facility] PROGRESS NOTE  Sonia Skinner [id number] DOB: September 21, 1950 DOA: 11/01/2013 PCP: Angelica Chessman, MD  Assessment/Plan: Acute left MCA infarct  -MRI brain shows patchy acute left MCA infarct  -MRI of brain suggests possible left ICA stenosis in the cavernous segment versus left neck  -Start with carotid ultrasound; may need MRA neck  -Await echocardiogram  -Hemoglobin A1c 10.3  -LDL 147  -PT/OT  -Appreciate neurology followup  -continue ASA 325mg   Left Carotid Stenosis -CTA of neck showed "at least" 80% stenosis of L-ICA with possible tailing thrombus -carotid ultrasound formal read is pending--??thrombus on ultrasound -consulted vascular surgery for evaluation-??may need anticoagulation -continue ASA 325mg  for now Left parotid nodule  -Appears suspicious on MRI-17 mm incidental finding  -Discussed with ENT, Dr. Constance Holster  -followup appt made for 11/12/13@130PM   Hypertension  -Continue amlodipine  -Restart lisinopril low dose Diabetes mellitus type 2  -Increase Lantus to 44 units -Patient had mild episode of hypoglycemia on 11/01/2013 which may have been partly due to the patient's glipizide  -NovoLog sliding scale  -NovoLog 4 units with meals Tobacco abuse  -Tobacco cessation discussed  Hypothyroidism  -Continue Synthroid  hyperlipidemia  -Start Lipitor  Family Communication: sister at beside  Disposition Plan: Home when medically stable         Procedures/Studies: Dg Chest 2 View  11/01/2013   CLINICAL DATA:  Stroke.  Smoker.  EXAM: CHEST  2 VIEW  COMPARISON:  None.  FINDINGS: Lungs are clear. Heart size is normal. No pneumothorax or pleural effusion.  IMPRESSION: Negative chest.   Electronically Signed   By: Inge Rise M.D.   On: 11/01/2013 21:33   Ct Head Wo Contrast  11/01/2013   CLINICAL DATA:  Right-sided weakness for 2 days  EXAM: CT HEAD WITHOUT CONTRAST  TECHNIQUE: Contiguous axial images were obtained from the base of the skull  through the vertex without intravenous contrast.  COMPARISON:  None.  FINDINGS: There is no evidence of mass effect, midline shift or extra-axial fluid collections. There is no evidence of a space-occupying lesion or intracranial hemorrhage. There is an area of low-attenuation at the gray-white junction in the left frontal lobe superiorly (image 22) which may reflect volume averaging versus a subacute infarct. There are no other areas of concerning for cortical infarction.  The ventricles and sulci are appropriate for the patient's age. The basal cisterns are patent.  Visualized portions of the orbits are unremarkable. There is mild sphenoid sinus mucosal thickening.  The osseous structures are unremarkable.  IMPRESSION: There is an area of low-attenuation at the gray-white junction in the left frontal lobe superiorly (image 22) which may reflect volume averaging versus a subacute infarct.   Electronically Signed   By: Kathreen Devoid   On: 11/01/2013 10:10   Ct Angio Neck W/cm &/or Wo/cm  11/03/2013   CLINICAL DATA:  Left MCA region infarctions. History of carotid stenoses.  EXAM: CT ANGIOGRAPHY NECK  TECHNIQUE: Multidetector CT imaging of the neck was performed using the standard protocol during bolus administration of intravenous contrast. Multiplanar CT image reconstructions and MIPs were obtained to evaluate the vascular anatomy. Carotid stenosis measurements (when applicable) are obtained utilizing NASCET criteria, using the distal internal carotid diameter as the denominator.  CONTRAST:  80mL OMNIPAQUE IOHEXOL 350 MG/ML SOLN  COMPARISON:  MRI 11/02/2013  FINDINGS: No focal lung lesions. No superior mediastinal mass. There is atherosclerosis of the aorta. Branching pattern of the brachiocephalic vessels from the arch is normal. Atherosclerotic disease at the left  subclavian origin results in narrowing of 30%. No stenosis of the innominate or left common carotid artery origin.  Right common carotid artery is  widely patent to the bifurcation region. There is atherosclerotic plaque at the carotid bifurcation but the minimal diameter of the ICA is 4 mm. Compared to a more distal cervical ICA diameter of 4 mm, there is no stenosis.  The left common carotid artery is widely patent to its distal extent. There is soft plaque at the distal common carotid artery with narrowing of the lumen from its expected diameter of 5 mm to a diameter of 2.5 mm just proximal to the bifurcation. This indicates a 50% stenosis. There is extensive primarily soft plaque affecting the proximal internal carotid artery with narrowing of the lumen to 1 mm or less. There may be an intraluminal filling defect extending from this region. The more distal cervical ICA is narrowed because of reduced inflow. The stenosis at the proximal ICA is at least 80% and possibly a severe is 90%. The left ICA does show flow through the skullbase. There is stenosis of the left external carotid artery as well.  Both vertebral artery origins are patent. Left vertebral artery is dominant. There is mild narrowing of both proximal vertebral arteries, but stenosis is no more than 30%. No stenosis in the cervical region. Both vessels show flow to the basilar.  Review of the MIP images confirms the above findings.  IMPRESSION: Non stenotic atherosclerotic plaque at the carotid bifurcation on the right.  Severe atherosclerotic disease at the carotid bifurcation region on the left. 50% stenosis of the common carotid artery just proximal to the bifurcation. Severe narrowing and irregularity of the internal carotid artery bulb with stenosis at least 80% and possibly greater. There may be some thrombus tailing from this region.  These results were called by telephone at the time of interpretation on 11/03/2013 at 4:32 PM to Dr. Shanon Brow Kyrie Fludd , who verbally acknowledged these results.   Electronically Signed   By: Nelson Chimes M.D.   On: 11/03/2013 16:32   Mr Brain Wo  Contrast  11/02/2013   CLINICAL DATA:  63 year old female with hypertension, diabetes, visual disturbances on the left with right side weakness. Right upper extremity weakness. Initial encounter.  EXAM: MRI HEAD WITHOUT CONTRAST  MRA HEAD WITHOUT CONTRAST  TECHNIQUE: Multiplanar, multiecho pulse sequences of the brain and surrounding structures were obtained without intravenous contrast. Angiographic images of the head were obtained using MRA technique without contrast.  COMPARISON:  Head CT without contrast 10/1913.  FINDINGS: MRI HEAD FINDINGS  Scattered mostly subcentimeter areas of restricted diffusion in the left MCA territory. Lesions involving from the left superior temporal gyrus (series 5, image 14), to the left periatrial white matter, to the sensory cortex and motor strip subcortical white matter (more confluent, up to 12 mm series 5, image 25). Associated mild T2 and FLAIR hyperintensity. No associated mass effect. No evidence of associated hemorrhage.  No right hemisphere or posterior fossa restricted diffusion. Major intracranial vascular flow voids are preserved. Outside of the affected areas, gray and white matter signal is within normal limits. No chronic infarcts are identified.  No ventriculomegaly. No midline shift, mass effect, or evidence of intracranial mass lesion. Negative pituitary, cervicomedullary junction visualized cervical spine. Visible internal auditory structures appear normal. Mastoids are clear.  Small left sphenoid sinus mucous retention cyst. Minor ethmoid sinus mucosal thickening. Visualized orbit soft tissues are within normal limits. Normal bone marrow signal. Visualized scalp soft tissues  are within normal limits.  In the left parotid space there is a 17 mm nodule with subtle dark T2 signal (series 11, image 14, and mildly restricted diffusion (series 6, image 15).  MRA HEAD FINDINGS  Antegrade flow in the posterior circulation with dominant distal left vertebral artery.  Normal PICA origins. Normal vertebrobasilar junction. No basilar stenosis. SCA and PCA origins are normal. Bilateral PCA branches are normal. Posterior communicating arteries are diminutive or absent.  Antegrade flow in the right ICA siphon which is irregular and appears up to moderately stenosed in the cavernous segment (series 3, image 72). There is also a small laterally directed cavernous aneurysm or ulcerated plaque (series 3, image 72 long arrow). The supra clinoid right ICA is patent. The right ICA terminus is normally patent.  Decreased antegrade flow in the left ICA siphon from the distal cervical levels. Moderate to severe irregularity in the left cavernous segment might be the source of hemodynamically significant stenosis (series 305, image 5). Nonetheless, the left supra clinoid ICA and terminus remain patent.  Normal right MCA and ACA origins and branches. Anterior communicating artery is normal.  On the left there is decreased flow signal throughout the left MCA branches, but no major branch occlusion or focal stenosis identified. Left ACA flow signal appears to be more symmetric.  IMPRESSION: 1. Scattered and patchy acute left MCA infarcts. No associated mass effect or hemorrhage. 2. MRA appearance indicating hemodynamically significant stenosis of the left ICA. The causative lesion might be in the left cavernous segment, or alternatively could be upstream in the left neck. 3. Subsequently decreased flow signal at the left ICA terminus and in the left MCA branches, but no major circle of Willis branch occlusion. 4. There is also extensive atherosclerosis of the right ICA cavernous segment with at least moderate stenosis on that side. 5. Unexpected finding of 17 mm left parotid space nodule with suspicious MRI characteristics. 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 Recommend nonurgent ENT followup once the patient recovers from the acute events.  Study discussed by telephone with Dr. Shanon Brow  Evadna Donaghy On 11/02/2013 at 10:58 .   Electronically Signed   By: Lars Pinks M.D.   On: 11/02/2013 11:10   Mr Jodene Nam Head/brain Wo Cm  11/02/2013   CLINICAL DATA:  63 year old female with hypertension, diabetes, visual disturbances on the left with right side weakness. Right upper extremity weakness. Initial encounter.  EXAM: MRI HEAD WITHOUT CONTRAST  MRA HEAD WITHOUT CONTRAST  TECHNIQUE: Multiplanar, multiecho pulse sequences of the brain and surrounding structures were obtained without intravenous contrast. Angiographic images of the head were obtained using MRA technique without contrast.  COMPARISON:  Head CT without contrast 10/1913.  FINDINGS: MRI HEAD FINDINGS  Scattered mostly subcentimeter areas of restricted diffusion in the left MCA territory. Lesions involving from the left superior temporal gyrus (series 5, image 14), to the left periatrial white matter, to the sensory cortex and motor strip subcortical white matter (more confluent, up to 12 mm series 5, image 25). Associated mild T2 and FLAIR hyperintensity. No associated mass effect. No evidence of associated hemorrhage.  No right hemisphere or posterior fossa restricted diffusion. Major intracranial vascular flow voids are preserved. Outside of the affected areas, gray and white matter signal is within normal limits. No chronic infarcts are identified.  No ventriculomegaly. No midline shift, mass effect, or evidence of intracranial mass lesion. Negative pituitary, cervicomedullary junction visualized cervical spine. Visible internal auditory structures appear normal. Mastoids are clear.  Small  left sphenoid sinus mucous retention cyst. Minor ethmoid sinus mucosal thickening. Visualized orbit soft tissues are within normal limits. Normal bone marrow signal. Visualized scalp soft tissues are within normal limits.  In the left parotid space there is a 17 mm nodule with subtle dark T2 signal (series 11, image 14, and mildly restricted diffusion (series 6, image  15).  MRA HEAD FINDINGS  Antegrade flow in the posterior circulation with dominant distal left vertebral artery. Normal PICA origins. Normal vertebrobasilar junction. No basilar stenosis. SCA and PCA origins are normal. Bilateral PCA branches are normal. Posterior communicating arteries are diminutive or absent.  Antegrade flow in the right ICA siphon which is irregular and appears up to moderately stenosed in the cavernous segment (series 3, image 72). There is also a small laterally directed cavernous aneurysm or ulcerated plaque (series 3, image 72 long arrow). The supra clinoid right ICA is patent. The right ICA terminus is normally patent.  Decreased antegrade flow in the left ICA siphon from the distal cervical levels. Moderate to severe irregularity in the left cavernous segment might be the source of hemodynamically significant stenosis (series 305, image 5). Nonetheless, the left supra clinoid ICA and terminus remain patent.  Normal right MCA and ACA origins and branches. Anterior communicating artery is normal.  On the left there is decreased flow signal throughout the left MCA branches, but no major branch occlusion or focal stenosis identified. Left ACA flow signal appears to be more symmetric.  IMPRESSION: 1. Scattered and patchy acute left MCA infarcts. No associated mass effect or hemorrhage. 2. MRA appearance indicating hemodynamically significant stenosis of the left ICA. The causative lesion might be in the left cavernous segment, or alternatively could be upstream in the left neck. 3. Subsequently decreased flow signal at the left ICA terminus and in the left MCA branches, but no major circle of Willis branch occlusion. 4. There is also extensive atherosclerosis of the right ICA cavernous segment with at least moderate stenosis on that side. 5. Unexpected finding of 17 mm left parotid space nodule with suspicious MRI characteristics. 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 Recommend  nonurgent ENT followup once the patient recovers from the acute events.  Study discussed by telephone with Dr. Shanon Brow Mafalda Mcginniss On 11/02/2013 at 10:58 .   Electronically Signed   By: Lars Pinks M.D.   On: 11/02/2013 11:10         Subjective: Patient denies fevers, chills, chest discomfort, shortness breath, nausea, vomiting, diarrhea, abdominal pain. She states that her vision is improving as well as her right upper extremity weakness. Denies any dysuria, hematuria, headaches,  Objective: Filed Vitals:   11/03/13 0120 11/03/13 0529 11/03/13 0931 11/03/13 1332  BP: 134/71 132/72 136/68 146/69  Pulse: 98 82 91 93  Temp: 97.8 F (36.6 C) 98.4 F (36.9 C) 98.4 F (36.9 C) 99.5 F (37.5 C)  TempSrc: Oral Oral Oral Oral  Resp: 18 18 18 18   Height:      Weight:      SpO2: 100% 99% 97% 98%   No intake or output data in the 24 hours ending 11/03/13 1635 Weight change:  Exam:   General:  Pt is alert, follows commands appropriately, not in acute distress  HEENT: No icterus, No thrush,  [place]/AT  Cardiovascular: RRR, S1/S2, no rubs, no gallops  Respiratory: CTA bilaterally, no wheezing, no crackles, no rhonchi  Abdomen: Soft/+BS, non tender, non distended, no guarding  Extremities: No edema, No lymphangitis, No petechiae, No rashes,  no synovitis  Data Reviewed: Basic Metabolic Panel:  Recent Labs Lab 11/01/13 0930 11/01/13 2127  NA 143  --   K 3.4*  --   CL 101  --   CO2 25  --   GLUCOSE 127*  --   BUN 9  --   CREATININE 0.90 0.88  CALCIUM 10.3  --    Liver Function Tests:  Recent Labs Lab 11/01/13 0930  AST 14  ALT 15  ALKPHOS 162*  BILITOT <0.2*  PROT 8.2  ALBUMIN 4.3   No results found for this basename: LIPASE, AMYLASE,  in the last 168 hours No results found for this basename: AMMONIA,  in the last 168 hours CBC:  Recent Labs Lab 11/01/13 0930 11/01/13 2127  WBC 12.6* 11.8*  NEUTROABS 7.8*  --   HGB 13.6 11.8*  HCT 41.1 36.1  MCV 81.7 81.5  PLT 213  186   Cardiac Enzymes:  Recent Labs Lab 11/01/13 0930  TROPONINI <0.30   BNP: No components found with this basename: POCBNP,  CBG:  Recent Labs Lab 11/02/13 2106 11/03/13 0638 11/03/13 0801 11/03/13 1147 11/03/13 1612  GLUCAP 138* 145* 189* 204* 137*    No results found for this or any previous visit (from the past 240 hour(s)).   Scheduled Meds: . amLODipine  10 mg Oral Daily  . aspirin  300 mg Rectal Daily   Or  . aspirin  325 mg Oral Daily  . atorvastatin  40 mg Oral q1800  . buPROPion  300 mg Oral Daily  . heparin  5,000 Units Subcutaneous 3 times per day  . insulin aspart  0-15 Units Subcutaneous TID WC  . insulin aspart  0-5 Units Subcutaneous QHS  . insulin aspart  4 Units Subcutaneous TID WC  . insulin glargine  40 Units Subcutaneous QHS  . levothyroxine  150 mcg Oral QAC breakfast  . nicotine  14 mg Transdermal Daily  . pantoprazole  40 mg Oral Daily   Continuous Infusions:    Paton Crum, DO  [facility] Pager [phone]  If 7PM-7AM, please contact night-coverage [email or web address] Password [facility] 11/03/2013, 4:35 PM   LOS: 2 days

## 2013-11-03 NOTE — Progress Notes (Signed)
Stroke Team Progress Note  HISTORY Sonia Skinner is a 63 y.o. female with a history of hypertension and diabetes mellitus presenting with a complaint of intermittent blurring of vision involving left eye for about one week, and new onset weakness involving right upper extremity. Patient woke up at midnight 11/01/2013 with right arm weakness. She had no symptoms when she went to bed at 9 PM.on 10/31/2013. CT scan of her head showed no acute intracranial abnormality except for possible abnormal density involving the left frontal lobe superiorly, suggestive of possible subacute stroke. Patient had not been on antiplatelet therapy. Had no previous history of stroke nor TIA. NIH stroke score was 1 for pronation and drift of right upper extremity.   LSN: 9 PM on 10/31/2013  tPA Given: No: Mild deficits; beyond time window for treatment consideration.  MRankin: 0  .  SUBJECTIVE Still with blurred vision in her left eye. Her right upper extremity is improving although she still feels she has coordination problems. She also reports a headache today and is concerned that the hearing in her left ear is somewhat impaired. Her MRI showed a small parotid nodule which will need an ENT evaluation on an outpatient basis. Her hearing couldn't be evaluated thoroughly at that time. She would like something stronger for her headache.  OBJECTIVE Most recent Vital Signs: Filed Vitals:   11/02/13 1816 11/02/13 2144 11/03/13 0120 11/03/13 0529  BP: 130/67 157/73 134/71 132/72  Pulse: 91 90 98 82  Temp: 98.3 F (36.8 C) 98.1 F (36.7 C) 97.8 F (36.6 C) 98.4 F (36.9 C)  TempSrc: Oral Oral Oral Oral  Resp: 18 18 18 18   Height:      Weight:      SpO2: 96% 95% 100% 99%   CBG (last 3)   Recent Labs  11/02/13 2106 11/03/13 0638 11/03/13 0801  GLUCAP 138* 145* 189*    IV Fluid Intake:     MEDICATIONS  . amLODipine  10 mg Oral Daily  . aspirin  300 mg Rectal Daily   Or  . aspirin  325 mg Oral Daily  .  atorvastatin  40 mg Oral q1800  . buPROPion  300 mg Oral Daily  . heparin  5,000 Units Subcutaneous 3 times per day  . insulin aspart  0-15 Units Subcutaneous TID WC  . insulin aspart  0-5 Units Subcutaneous QHS  . insulin aspart  4 Units Subcutaneous TID WC  . insulin glargine  40 Units Subcutaneous QHS  . levothyroxine  150 mcg Oral QAC breakfast  . nicotine  14 mg Transdermal Daily  . pantoprazole  40 mg Oral Daily   PRN:  acetaminophen, senna-docusate  Diet:    Heart healthy carb modified diet with thin liquids Activity:  Up with assistance DVT Prophylaxis:  Subcutaneous heparin  CLINICALLY SIGNIFICANT STUDIES Basic Metabolic Panel:   Recent Labs Lab 11/01/13 0930 11/01/13 2127  NA 143  --   K 3.4*  --   CL 101  --   CO2 25  --   GLUCOSE 127*  --   BUN 9  --   CREATININE 0.90 0.88  CALCIUM 10.3  --    Liver Function Tests:   Recent Labs Lab 11/01/13 0930  AST 14  ALT 15  ALKPHOS 162*  BILITOT <0.2*  PROT 8.2  ALBUMIN 4.3   CBC:   Recent Labs Lab 11/01/13 0930 11/01/13 2127  WBC 12.6* 11.8*  NEUTROABS 7.8*  --   HGB 13.6 11.8*  HCT 41.1 36.1  MCV 81.7 81.5  PLT 213 186   Coagulation:   Recent Labs Lab 11/01/13 0930  LABPROT 12.5  INR 0.95   Cardiac Enzymes:   Recent Labs Lab 11/01/13 0930  TROPONINI <0.30   Urinalysis:   Recent Labs Lab 11/01/13 0945  COLORURINE YELLOW  LABSPEC 1.004*  PHURINE 6.0  GLUCOSEU NEGATIVE  HGBUR NEGATIVE  BILIRUBINUR NEGATIVE  KETONESUR NEGATIVE  PROTEINUR NEGATIVE  UROBILINOGEN 0.2  NITRITE NEGATIVE  LEUKOCYTESUR TRACE*   Lipid Panel    Component Value Date/Time   CHOL 221* 11/02/2013 0225   TRIG 190* 11/02/2013 0225   HDL 36* 11/02/2013 0225   CHOLHDL 6.1 11/02/2013 0225   VLDL 38 11/02/2013 0225   LDLCALC 147* 11/02/2013 0225   HgbA1C  Lab Results  Component Value Date   HGBA1C 10.5* 11/02/2013    Urine Drug Screen:     Component Value Date/Time   LABOPIA NONE DETECTED 11/01/2013 0945    COCAINSCRNUR NONE DETECTED 11/01/2013 0945   LABBENZ NONE DETECTED 11/01/2013 0945   AMPHETMU NONE DETECTED 11/01/2013 0945   THCU NONE DETECTED 11/01/2013 0945   LABBARB NONE DETECTED 11/01/2013 0945    Alcohol Level:   Recent Labs Lab 11/01/13 0930  ETH <11    Dg Chest 2 View 11/01/2013    Negative chest.     Ct Head Wo Contrast 11/01/2013    There is an area of low-attenuation at the gray-white junction in the left frontal lobe superiorly (image 22) which may reflect volume averaging versus a subacute infarct.    MRI / MRA of the brain   1. Scattered and patchy acute left MCA infarcts. No associated mass effect or hemorrhage. 2. MRA appearance indicating hemodynamically significant stenosis of the left ICA. The causative lesion might be in the left cavernous segment, or alternatively could be upstream in the left neck. 3. Subsequently decreased flow signal at the left ICA terminus and in the left MCA branches, but no major circle of Willis branch occlusion. 4. There is also extensive atherosclerosis of the right ICA cavernous segment with at least moderate stenosis on that side. 5. Unexpected finding of 17 mm left parotid space nodule with suspicious MRI characteristics. 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 Recommend nonurgent ENT followup once the patient recovers from the acute events.    2D Echocardiogram  - ejection fraction 60-65%. No cardiac source of emboli identified.  Carotid Doppler  Preliminary results - Right: 1-39% ICA stenosis. Left: Greater than 80% internal carotid artery stenosis. Bilateral: Vertebral artery flow is antegrade.   EKG normal sinus rhythm rate 93 beats per minute  For complete results please see formal report.   Therapy Recommendations - outpatient occupational therapy. No followup physical therapy recommended.  Physical Exam   Frail elderly african American lady not in distress.Awake alert. Afebrile. Head is nontraumatic.  Neck is supple without bruit. Hearing is normal. Cardiac exam no murmur or gallop. Lungs are clear to auscultation. Distal pulses are well felt. Neurological Exam ; awake alert oriented x3 with normal speech and language function. Fundi were not visualized. Finger counting is adequate in the left eye but patient has subjective blurred vision and difficulty with reading fine print. Right eye vision appears normal. Visual fields appear normal at the bedside. Face is symmetric tongue is midline. Motor system exam no upper or lower eczema to drift with weakness of the right grip and intrinsic hand muscles. Orbits left-to-right upper extremity. No lower extremity weakness. Gait  was not tested. Sensation appears normal.  ASSESSMENT Sonia Skinner is a 63 y.o. female presenting with blurred vision left eye with right upper extremity weakness. TPA was not given secondary to mild deficits and late presentation. An MRI revealed  scattered and patchy acute left MCA infarcts suspect watershed left ACA-MCA territory. Infarcts felt to be embolic secondary to proximal left internal carotid artery stenosis.  On no antithrombotic prior to admission. Now on aspirin 325 mg orally every day for secondary stroke prevention. Patient with resultant right upper extremity weakness and blurred vision in the left eye. Stroke work up underway.   Tobacco history  Hyperlipidemia  Diabetes mellitus - hemoglobin A1c 10.3  Left parotid nodule - further evaluation recommended  Hypertension history  Left carotid artery stenosis - consider vascular consult.   Hospital day # 2  TREATMENT/PLAN  Continue aspirin 325 mg orally every day for secondary stroke prevention.  Hyperlipidemia - Lipitor started - cholesterol 221 LDL 147  Followup left parotid nodule with decreased hearing on the left.  Vascular consult for left carotid artery stenosis.  Outpatient occupational therapy recommended. No followup physical  therapy.  Stroke work up complete. We will sign off.  Followup Dr. Leonie Man in 2 months.   Mikey Bussing PA-C [facility] Pager [phone] 11/03/2013, 9:06 AM  I have personally obtained a history, examined the patient, evaluated imaging results, and formulated the assessment and plan of care. I agree with the above.    To contact Stroke Continuity provider, please refer to [email or web address] After hours, contact General Neurology

## 2013-11-03 NOTE — Progress Notes (Signed)
Pt IV infiltrated, Heparin cut off at [place]. IV restarted by IV team, heparin resumed at 2019. Pharmacy aware of pause during treatment time with no rate adjustment at this time. Will continue to monitor. Verdie Drown RN BSN.

## 2013-11-04 ENCOUNTER — Encounter (HOSPITAL_COMMUNITY)
Admission: EM | Disposition: A | Discharge status: Home Health | Payer: Self-pay | Source: Home / Self Care | Attending: Internal Medicine

## 2013-11-04 ENCOUNTER — Encounter (HOSPITAL_COMMUNITY): Payer: No Typology Code available for payment source | Admitting: Certified Registered"

## 2013-11-04 ENCOUNTER — Inpatient Hospital Stay (HOSPITAL_COMMUNITY): Payer: No Typology Code available for payment source | Admitting: Certified Registered"

## 2013-11-04 DIAGNOSIS — I6529 Occlusion and stenosis of unspecified carotid artery: Secondary | ICD-10-CM

## 2013-11-04 HISTORY — PX: ENDARTERECTOMY: SHX5162

## 2013-11-04 HISTORY — PX: CAROTID ENDARTERECTOMY: SUR193

## 2013-11-04 LAB — SURGICAL PCR SCREEN
MRSA, PCR: NEGATIVE
Staphylococcus aureus: NEGATIVE

## 2013-11-04 LAB — BASIC METABOLIC PANEL
BUN: 12 mg/dL (ref 6–23)
CO2: 22 mEq/L (ref 19–32)
Calcium: 9.5 mg/dL (ref 8.4–10.5)
Chloride: 106 mEq/L (ref 96–112)
Creatinine, Ser: 0.8 mg/dL (ref 0.50–1.10)
GFR calc Af Amer: 88 mL/min — ABNORMAL LOW (ref >90)
GFR calc non Af Amer: 76 mL/min — ABNORMAL LOW (ref >90)
Glucose, Bld: 104 mg/dL — ABNORMAL HIGH (ref 70–99)
Potassium: 4 mEq/L (ref 3.7–5.3)
Sodium: 142 mEq/L (ref 137–147)

## 2013-11-04 LAB — TYPE AND SCREEN
ABO/RH(D): B POS
Antibody Screen: NEGATIVE

## 2013-11-04 LAB — CBC
HCT: 31.1 % — ABNORMAL LOW (ref 36.0–46.0)
Hemoglobin: 10.3 g/dL — ABNORMAL LOW (ref 12.0–15.0)
MCH: 27.2 pg (ref 26.0–34.0)
MCHC: 33.1 g/dL (ref 30.0–36.0)
MCV: 82.1 fL (ref 78.0–100.0)
Platelets: 179 10*3/uL (ref 150–400)
RBC: 3.79 MIL/uL — ABNORMAL LOW (ref 3.87–5.11)
RDW: 14.2 % (ref 11.5–15.5)
WBC: 14 10*3/uL — ABNORMAL HIGH (ref 4.0–10.5)

## 2013-11-04 LAB — ABO/RH: ABO/RH(D): B POS

## 2013-11-04 LAB — CREATININE, SERUM
Creatinine, Ser: 0.85 mg/dL (ref 0.50–1.10)
GFR calc Af Amer: 82 mL/min — ABNORMAL LOW (ref >90)
GFR calc non Af Amer: 71 mL/min — ABNORMAL LOW (ref >90)

## 2013-11-04 LAB — GLUCOSE, CAPILLARY
Glucose-Capillary: 165 mg/dL — ABNORMAL HIGH (ref 70–99)
Glucose-Capillary: 161 mg/dL — ABNORMAL HIGH (ref 70–99)
Glucose-Capillary: 170 mg/dL — ABNORMAL HIGH (ref 70–99)
Glucose-Capillary: 88 mg/dL (ref 70–99)

## 2013-11-04 SURGERY — ENDARTERECTOMY, CAROTID
Anesthesia: General | Site: Neck | Laterality: Left

## 2013-11-04 MED ORDER — ROCURONIUM BROMIDE 50 MG/5ML IV SOLN
INTRAVENOUS | Status: AC
  Filled 2013-11-04: qty 1

## 2013-11-04 MED ORDER — PROTAMINE SULFATE 10 MG/ML IV SOLN
INTRAVENOUS | Status: DC | PRN
Start: 2013-11-04 — End: 2013-11-04
  Administered 2013-11-04 (×3): 10 mg via INTRAVENOUS

## 2013-11-04 MED ORDER — PHENOL 1.4 % MT LIQD
1.0000 | OROMUCOSAL | Status: DC | PRN
  Administered 2013-11-04: 1 via OROMUCOSAL
  Filled 2013-11-04: qty 177

## 2013-11-04 MED ORDER — SODIUM CHLORIDE 0.9 % IV SOLN
INTRAVENOUS | Status: DC
  Administered 2013-11-05 (×2): via INTRAVENOUS

## 2013-11-04 MED ORDER — HYDRALAZINE HCL 20 MG/ML IJ SOLN: 10.0000 mg | INTRAMUSCULAR | Status: DC | PRN

## 2013-11-04 MED ORDER — NEOSTIGMINE METHYLSULFATE 1 MG/ML IJ SOLN
INTRAMUSCULAR | Status: AC
  Filled 2013-11-04: qty 10

## 2013-11-04 MED ORDER — HEPARIN SODIUM (PORCINE) 1000 UNIT/ML IJ SOLN
INTRAMUSCULAR | Status: AC
  Filled 2013-11-04: qty 1

## 2013-11-04 MED ORDER — PHENYLEPHRINE HCL 10 MG/ML IJ SOLN
10.0000 mg | INTRAVENOUS | Status: DC | PRN
  Administered 2013-11-04: 15 ug/min via INTRAVENOUS

## 2013-11-04 MED ORDER — LACTATED RINGERS IV SOLN
INTRAVENOUS | Status: DC | PRN
  Administered 2013-11-04 (×2): via INTRAVENOUS

## 2013-11-04 MED ORDER — THROMBIN 20000 UNITS EX SOLR
CUTANEOUS | Status: AC
  Filled 2013-11-04: qty 20000

## 2013-11-04 MED ORDER — SODIUM CHLORIDE 0.9 % IV SOLN
500.0000 mL | Freq: Once | INTRAVENOUS | Status: AC | PRN
  Administered 2013-11-04: 500 mL via INTRAVENOUS

## 2013-11-04 MED ORDER — ONDANSETRON HCL 4 MG/2ML IJ SOLN
4.0000 mg | Freq: Four times a day (QID) | INTRAMUSCULAR | Status: DC | PRN
  Administered 2013-11-04: 4 mg via INTRAVENOUS

## 2013-11-04 MED ORDER — GLYCOPYRROLATE 0.2 MG/ML IJ SOLN
INTRAMUSCULAR | Status: AC
  Filled 2013-11-04: qty 2

## 2013-11-04 MED ORDER — SODIUM CHLORIDE 0.9 % IR SOLN
Status: DC | PRN
  Administered 2013-11-04: 09:00:00

## 2013-11-04 MED ORDER — FENTANYL CITRATE 0.05 MG/ML IJ SOLN
INTRAMUSCULAR | Status: AC
  Filled 2013-11-04: qty 5

## 2013-11-04 MED ORDER — ONDANSETRON HCL 4 MG/2ML IJ SOLN
INTRAMUSCULAR | Status: DC | PRN
Start: 2013-11-04 — End: 2013-11-04
  Administered 2013-11-04: 4 mg via INTRAVENOUS

## 2013-11-04 MED ORDER — HYDROMORPHONE HCL PF 1 MG/ML IJ SOLN: 0.2500 mg | INTRAMUSCULAR | Status: DC | PRN

## 2013-11-04 MED ORDER — 0.9 % SODIUM CHLORIDE (POUR BTL) OPTIME
TOPICAL | Status: DC | PRN
  Administered 2013-11-04: 1000 mL

## 2013-11-04 MED ORDER — OXYCODONE HCL 5 MG PO TABS: 5.0000 mg | ORAL_TABLET | Freq: Once | ORAL | Status: AC | PRN

## 2013-11-04 MED ORDER — LIDOCAINE HCL (CARDIAC) 20 MG/ML IV SOLN
INTRAVENOUS | Status: AC
  Filled 2013-11-04: qty 5

## 2013-11-04 MED ORDER — ONDANSETRON HCL 4 MG/2ML IJ SOLN
4.0000 mg | Freq: Once | INTRAMUSCULAR | Status: AC | PRN
  Filled 2013-11-04: qty 2

## 2013-11-04 MED ORDER — DEXTRAN 40 IN SALINE 10-0.9 % IV SOLN
INTRAVENOUS | Status: AC
  Filled 2013-11-04: qty 500

## 2013-11-04 MED ORDER — HEPARIN SODIUM (PORCINE) 1000 UNIT/ML IJ SOLN
INTRAMUSCULAR | Status: DC | PRN
  Administered 2013-11-04: 6500 [IU] via INTRAVENOUS

## 2013-11-04 MED ORDER — METOPROLOL TARTRATE 1 MG/ML IV SOLN: 2.0000 mg | INTRAVENOUS | Status: DC | PRN

## 2013-11-04 MED ORDER — ENOXAPARIN SODIUM 40 MG/0.4ML ~~LOC~~ SOLN
40.0000 mg | SUBCUTANEOUS | Status: DC
  Administered 2013-11-05 – 2013-11-06 (×2): 40 mg via SUBCUTANEOUS
  Filled 2013-11-04 (×2): qty 0.4

## 2013-11-04 MED ORDER — OXYCODONE HCL 5 MG/5ML PO SOLN
5.0000 mg | Freq: Once | ORAL | Status: AC | PRN
  Administered 2013-11-04: 5 mg via ORAL
  Filled 2013-11-04: qty 5

## 2013-11-04 MED ORDER — MEPERIDINE HCL 25 MG/ML IJ SOLN: 6.2500 mg | INTRAMUSCULAR | Status: DC | PRN

## 2013-11-04 MED ORDER — PROPOFOL 10 MG/ML IV BOLUS
INTRAVENOUS | Status: DC | PRN
Start: 2013-11-04 — End: 2013-11-04
  Administered 2013-11-04: 200 mg via INTRAVENOUS

## 2013-11-04 MED ORDER — DEXTROSE 5 % IV SOLN
1.5000 g | Freq: Two times a day (BID) | INTRAVENOUS | Status: AC
  Administered 2013-11-04 (×2): 1.5 g via INTRAVENOUS
  Filled 2013-11-04 (×2): qty 1.5

## 2013-11-04 MED ORDER — SUCCINYLCHOLINE CHLORIDE 20 MG/ML IJ SOLN
INTRAMUSCULAR | Status: AC
  Filled 2013-11-04: qty 1

## 2013-11-04 MED ORDER — POTASSIUM CHLORIDE CRYS ER 20 MEQ PO TBCR: 20.0000 meq | EXTENDED_RELEASE_TABLET | Freq: Every day | ORAL | Status: DC | PRN

## 2013-11-04 MED ORDER — LIDOCAINE HCL (CARDIAC) 20 MG/ML IV SOLN
INTRAVENOUS | Status: DC | PRN
Start: 2013-11-04 — End: 2013-11-04
  Administered 2013-11-04: 100 mg via INTRAVENOUS
  Administered 2013-11-04: 60 mg via INTRATRACHEAL

## 2013-11-04 MED ORDER — LIDOCAINE-EPINEPHRINE (PF) 1 %-1:200000 IJ SOLN
INTRAMUSCULAR | Status: DC | PRN
  Administered 2013-11-04: 10 mL via INTRADERMAL

## 2013-11-04 MED ORDER — LABETALOL HCL 5 MG/ML IV SOLN
INTRAVENOUS | Status: DC | PRN
  Administered 2013-11-04: 10 mg via INTRAVENOUS

## 2013-11-04 MED ORDER — PROPOFOL 10 MG/ML IV BOLUS
INTRAVENOUS | Status: AC
  Filled 2013-11-04: qty 20

## 2013-11-04 MED ORDER — FENTANYL CITRATE 0.05 MG/ML IJ SOLN
INTRAMUSCULAR | Status: DC | PRN
  Administered 2013-11-04 (×2): 100 ug via INTRAVENOUS
  Administered 2013-11-04 (×2): 50 ug via INTRAVENOUS

## 2013-11-04 MED ORDER — DOCUSATE SODIUM 100 MG PO CAPS
100.0000 mg | ORAL_CAPSULE | Freq: Every day | ORAL | Status: DC
  Administered 2013-11-05 – 2013-11-06 (×2): 100 mg via ORAL
  Filled 2013-11-04 (×2): qty 1

## 2013-11-04 MED ORDER — DOPAMINE-DEXTROSE 3.2-5 MG/ML-% IV SOLN: 3.0000 ug/kg/min | INTRAVENOUS | Status: DC

## 2013-11-04 MED ORDER — GUAIFENESIN-DM 100-10 MG/5ML PO SYRP: 15.0000 mL | ORAL_SOLUTION | ORAL | Status: DC | PRN

## 2013-11-04 MED ORDER — LABETALOL HCL 5 MG/ML IV SOLN: 10.0000 mg | INTRAVENOUS | Status: DC | PRN

## 2013-11-04 MED ORDER — DEXTRAN 40 IN SALINE 10-0.9 % IV SOLN
INTRAVENOUS | Status: DC | PRN
  Administered 2013-11-04: 180 mL

## 2013-11-04 MED ORDER — NEOSTIGMINE METHYLSULFATE 1 MG/ML IJ SOLN
INTRAMUSCULAR | Status: DC | PRN
  Administered 2013-11-04: 3 mg via INTRAVENOUS

## 2013-11-04 MED ORDER — ROCURONIUM BROMIDE 100 MG/10ML IV SOLN
INTRAVENOUS | Status: DC | PRN
Start: 2013-11-04 — End: 2013-11-04
  Administered 2013-11-04: 50 mg via INTRAVENOUS

## 2013-11-04 MED ORDER — LIDOCAINE HCL (PF) 1 % IJ SOLN
INTRAMUSCULAR | Status: AC
  Filled 2013-11-04: qty 30

## 2013-11-04 MED ORDER — EPHEDRINE SULFATE 50 MG/ML IJ SOLN
INTRAMUSCULAR | Status: AC
  Filled 2013-11-04: qty 1

## 2013-11-04 MED ORDER — MORPHINE SULFATE 2 MG/ML IJ SOLN
2.0000 mg | INTRAMUSCULAR | Status: DC | PRN
  Administered 2013-11-04 – 2013-11-05 (×8): 2 mg via INTRAVENOUS
  Filled 2013-11-04 (×8): qty 1

## 2013-11-04 MED ORDER — PHENYLEPHRINE 40 MCG/ML (10ML) SYRINGE FOR IV PUSH (FOR BLOOD PRESSURE SUPPORT)
PREFILLED_SYRINGE | INTRAVENOUS | Status: AC
  Filled 2013-11-04: qty 10

## 2013-11-04 MED ORDER — GLYCOPYRROLATE 0.2 MG/ML IJ SOLN
INTRAMUSCULAR | Status: DC | PRN
Start: 2013-11-04 — End: 2013-11-04
  Administered 2013-11-04: 0.4 mg via INTRAVENOUS

## 2013-11-04 MED ORDER — PROTAMINE SULFATE 10 MG/ML IV SOLN
INTRAVENOUS | Status: AC
  Filled 2013-11-04: qty 5

## 2013-11-04 MED ORDER — SODIUM CHLORIDE 0.9 % IJ SOLN
INTRAMUSCULAR | Status: AC
  Filled 2013-11-04: qty 10

## 2013-11-04 SURGICAL SUPPLY — 49 items
BAG DECANTER FOR FLEXI CONT (MISCELLANEOUS) ×2 IMPLANT
CANISTER SUCTION 2500CC (MISCELLANEOUS) ×2 IMPLANT
CANNULA VESSEL 3MM 2 BLNT TIP (CANNULA) ×4 IMPLANT
CATH ROBINSON RED A/P 18FR (CATHETERS) ×2 IMPLANT
CLIP TI MEDIUM 24 (CLIP) ×2 IMPLANT
CLIP TI WIDE RED SMALL 24 (CLIP) ×2 IMPLANT
COVER SURGICAL LIGHT HANDLE (MISCELLANEOUS) ×2 IMPLANT
CRADLE DONUT ADULT HEAD (MISCELLANEOUS) ×2 IMPLANT
DERMABOND ADHESIVE PROPEN (GAUZE/BANDAGES/DRESSINGS) ×1
DERMABOND ADVANCED (GAUZE/BANDAGES/DRESSINGS) ×1
DERMABOND ADVANCED .7 DNX12 (GAUZE/BANDAGES/DRESSINGS) ×1 IMPLANT
DERMABOND ADVANCED .7 DNX6 (GAUZE/BANDAGES/DRESSINGS) ×1 IMPLANT
DRAIN CHANNEL 15F RND FF W/TCR (WOUND CARE) IMPLANT
DRAPE WARM FLUID 44X44 (DRAPE) ×2 IMPLANT
ELECT REM PT RETURN 9FT ADLT (ELECTROSURGICAL) ×2
ELECTRODE REM PT RTRN 9FT ADLT (ELECTROSURGICAL) ×1 IMPLANT
EVACUATOR SILICONE 100CC (DRAIN) IMPLANT
GLOVE BIO SURGEON STRL SZ 6.5 (GLOVE) ×10 IMPLANT
GLOVE BIO SURGEON STRL SZ7.5 (GLOVE) ×2 IMPLANT
GLOVE BIOGEL PI IND STRL 7.0 (GLOVE) ×2 IMPLANT
GLOVE BIOGEL PI IND STRL 8 (GLOVE) ×2 IMPLANT
GLOVE BIOGEL PI INDICATOR 7.0 (GLOVE) ×2
GLOVE BIOGEL PI INDICATOR 8 (GLOVE) ×2
GOWN STRL REUS W/ TWL LRG LVL3 (GOWN DISPOSABLE) ×3 IMPLANT
GOWN STRL REUS W/TWL LRG LVL3 (GOWN DISPOSABLE) ×3
KIT BASIN OR (CUSTOM PROCEDURE TRAY) ×2 IMPLANT
KIT ROOM TURNOVER OR (KITS) ×2 IMPLANT
NEEDLE HYPO 25X1 1.5 SAFETY (NEEDLE) ×2 IMPLANT
NS IRRIG 1000ML POUR BTL (IV SOLUTION) ×4 IMPLANT
PACK CAROTID (CUSTOM PROCEDURE TRAY) ×2 IMPLANT
PAD ARMBOARD 7.5X6 YLW CONV (MISCELLANEOUS) ×4 IMPLANT
PATCH VASCULAR VASCU GUARD 1X6 (Vascular Products) ×2 IMPLANT
SHUNT CAROTID BYPASS 10 (VASCULAR PRODUCTS) ×2 IMPLANT
SHUNT CAROTID BYPASS 12FRX15.5 (VASCULAR PRODUCTS) IMPLANT
SPONGE SURGIFOAM ABS GEL 100 (HEMOSTASIS) IMPLANT
SUT PROLENE 6 0 BV (SUTURE) ×2 IMPLANT
SUT PROLENE 7 0 BV 1 (SUTURE) IMPLANT
SUT SILK 2 0 FS (SUTURE) IMPLANT
SUT SILK 3 0 (SUTURE) ×1
SUT SILK 3 0 TIES 17X18 (SUTURE)
SUT SILK 3-0 18XBRD TIE 12 (SUTURE) ×1 IMPLANT
SUT SILK 3-0 18XBRD TIE BLK (SUTURE) IMPLANT
SUT VIC AB 3-0 SH 27 (SUTURE) ×1
SUT VIC AB 3-0 SH 27X BRD (SUTURE) ×1 IMPLANT
SUT VICRYL 4-0 PS2 18IN ABS (SUTURE) ×2 IMPLANT
SYR CONTROL 10ML LL (SYRINGE) ×2 IMPLANT
TOWEL OR 17X24 6PK STRL BLUE (TOWEL DISPOSABLE) ×2 IMPLANT
TOWEL OR 17X26 10 PK STRL BLUE (TOWEL DISPOSABLE) ×2 IMPLANT
WATER STERILE IRR 1000ML POUR (IV SOLUTION) ×2 IMPLANT

## 2013-11-04 NOTE — Progress Notes (Signed)
   VASCULAR PROGRESS NOTE  SUBJECTIVE: Comfortable.  PHYSICAL EXAM: Filed Vitals:   11/04/13 1300 11/04/13 1330 11/04/13 1400 11/04/13 1430  BP: 107/39 104/40 111/44 111/49  Pulse: 76 74 73 80  Temp:      TempSrc:      Resp: 13 14 13 12   Height:      Weight:      SpO2: 94% 90% 92% 95%   Incision looks fine Neuro intact Tongue midline  LABS: Lab Results  Component Value Date   WBC 11.8* 11/01/2013   HGB 11.8* 11/01/2013   HCT 36.1 11/01/2013   MCV 81.5 11/01/2013   PLT 186 11/01/2013   Lab Results  Component Value Date   CREATININE 0.80 11/04/2013   Lab Results  Component Value Date   INR 0.95 11/01/2013   CBG (last 3)   Recent Labs  11/03/13 2127 11/04/13 0956 11/04/13 1216  GLUCAP 97 165* 161*    Active Problems:   Essential hypertension, benign   Type II or unspecified type diabetes mellitus   CVA (cerebral vascular accident)   TIA (transient ischemic attack)   Parotid mass   Acute ischemic left MCA stroke   Type II or unspecified type diabetes mellitus with neurological manifestations, not stated as uncontrolled   Carotid stenosis   ASSESSMENT AND PLAN:  * Stable post op.   Gae Gallop [id number] 11/04/2013

## 2013-11-04 NOTE — Progress Notes (Signed)
[facility] PROGRESS NOTE  DELAINIE NEITZKE [id number] DOB: [birth year] DOA: 11/01/2013 PCP: Angelica Chessman, MD  Assessment/Plan: Acute left MCA infarct  -MRI brain shows patchy acute left MCA infarct  -MRI of brain suggests possible left ICA stenosis in the cavernous segment versus left neck  -carotid u/s-->80% L-carotid stenosis -echocardiogram--EF60-65%, no WMA, grade 1 diastolic dysfx; no embolic source -Hemoglobin A1c 10.3  -LDL 147  -PT/OT  -Appreciate neurology followup  -continue ASA 325mg   Left Carotid Stenosis  -CTA of neck showed "at least" 80% stenosis of L-ICA with possible tailing thrombus  -appreciate Dr. Scot Dock -s/p L-CEA 11/05/13 -continue ASA 325mg   Left parotid nodule  -Appears suspicious on MRI-17 mm incidental finding  -Discussed with ENT, Dr. Constance Holster  -followup appt made for 11/12/13@130PM   Hypertension  -hold lisinopril and amlodipine today as BP is soft post op Diabetes mellitus type 2  -Increase Lantus to 44 units  -Patient had mild episode of hypoglycemia on 11/01/2013 which may have been partly due to the patient's glipizide  -NovoLog sliding scale  -NovoLog 4 units with meals  Tobacco abuse  -Tobacco cessation discussed  Hypothyroidism  -Continue Synthroid  hyperlipidemia  -Start Lipitor  Family Communication: sister at beside updated on 11/02/13 Disposition Plan: Home when medically stable          Procedures/Studies: Dg Chest 2 View  11/01/2013   CLINICAL DATA:  Stroke.  Smoker.  EXAM: CHEST  2 VIEW  COMPARISON:  None.  FINDINGS: Lungs are clear. Heart size is normal. No pneumothorax or pleural effusion.  IMPRESSION: Negative chest.   Electronically Signed   By: Inge Rise M.D.   On: 11/01/2013 21:33   Ct Head Wo Contrast  11/01/2013   CLINICAL DATA:  Right-sided weakness for 2 days  EXAM: CT HEAD WITHOUT CONTRAST  TECHNIQUE: Contiguous axial images were obtained from the base of the skull through the vertex without  intravenous contrast.  COMPARISON:  None.  FINDINGS: There is no evidence of mass effect, midline shift or extra-axial fluid collections. There is no evidence of a space-occupying lesion or intracranial hemorrhage. There is an area of low-attenuation at the gray-white junction in the left frontal lobe superiorly (image 22) which may reflect volume averaging versus a subacute infarct. There are no other areas of concerning for cortical infarction.  The ventricles and sulci are appropriate for the patient's age. The basal cisterns are patent.  Visualized portions of the orbits are unremarkable. There is mild sphenoid sinus mucosal thickening.  The osseous structures are unremarkable.  IMPRESSION: There is an area of low-attenuation at the gray-white junction in the left frontal lobe superiorly (image 22) which may reflect volume averaging versus a subacute infarct.   Electronically Signed   By: Kathreen Devoid   On: 11/01/2013 10:10   Ct Angio Neck W/cm &/or Wo/cm  11/03/2013   CLINICAL DATA:  Left MCA region infarctions. History of carotid stenoses.  EXAM: CT ANGIOGRAPHY NECK  TECHNIQUE: Multidetector CT imaging of the neck was performed using the standard protocol during bolus administration of intravenous contrast. Multiplanar CT image reconstructions and MIPs were obtained to evaluate the vascular anatomy. Carotid stenosis measurements (when applicable) are obtained utilizing NASCET criteria, using the distal internal carotid diameter as the denominator.  CONTRAST:  27mL OMNIPAQUE IOHEXOL 350 MG/ML SOLN  COMPARISON:  MRI 11/02/2013  FINDINGS: No focal lung lesions. No superior mediastinal mass. There is atherosclerosis of the aorta. Branching pattern of the brachiocephalic vessels from the arch is normal. Atherosclerotic disease  at the left subclavian origin results in narrowing of 30%. No stenosis of the innominate or left common carotid artery origin.  Right common carotid artery is widely patent to the  bifurcation region. There is atherosclerotic plaque at the carotid bifurcation but the minimal diameter of the ICA is 4 mm. Compared to a more distal cervical ICA diameter of 4 mm, there is no stenosis.  The left common carotid artery is widely patent to its distal extent. There is soft plaque at the distal common carotid artery with narrowing of the lumen from its expected diameter of 5 mm to a diameter of 2.5 mm just proximal to the bifurcation. This indicates a 50% stenosis. There is extensive primarily soft plaque affecting the proximal internal carotid artery with narrowing of the lumen to 1 mm or less. There may be an intraluminal filling defect extending from this region. The more distal cervical ICA is narrowed because of reduced inflow. The stenosis at the proximal ICA is at least 80% and possibly a severe is 90%. The left ICA does show flow through the skullbase. There is stenosis of the left external carotid artery as well.  Both vertebral artery origins are patent. Left vertebral artery is dominant. There is mild narrowing of both proximal vertebral arteries, but stenosis is no more than 30%. No stenosis in the cervical region. Both vessels show flow to the basilar.  Review of the MIP images confirms the above findings.  IMPRESSION: Non stenotic atherosclerotic plaque at the carotid bifurcation on the right.  Severe atherosclerotic disease at the carotid bifurcation region on the left. 50% stenosis of the common carotid artery just proximal to the bifurcation. Severe narrowing and irregularity of the internal carotid artery bulb with stenosis at least 80% and possibly greater. There may be some thrombus tailing from this region.  These results were called by telephone at the time of interpretation on 11/03/2013 at 4:32 PM to Dr. Shanon Brow Daxx Tiggs , who verbally acknowledged these results.   Electronically Signed   By: Nelson Chimes M.D.   On: 11/03/2013 16:32   Mr Brain Wo Contrast  11/02/2013   CLINICAL DATA:   63 year old female with hypertension, diabetes, visual disturbances on the left with right side weakness. Right upper extremity weakness. Initial encounter.  EXAM: MRI HEAD WITHOUT CONTRAST  MRA HEAD WITHOUT CONTRAST  TECHNIQUE: Multiplanar, multiecho pulse sequences of the brain and surrounding structures were obtained without intravenous contrast. Angiographic images of the head were obtained using MRA technique without contrast.  COMPARISON:  Head CT without contrast 10/1913.  FINDINGS: MRI HEAD FINDINGS  Scattered mostly subcentimeter areas of restricted diffusion in the left MCA territory. Lesions involving from the left superior temporal gyrus (series 5, image 14), to the left periatrial white matter, to the sensory cortex and motor strip subcortical white matter (more confluent, up to 12 mm series 5, image 25). Associated mild T2 and FLAIR hyperintensity. No associated mass effect. No evidence of associated hemorrhage.  No right hemisphere or posterior fossa restricted diffusion. Major intracranial vascular flow voids are preserved. Outside of the affected areas, gray and white matter signal is within normal limits. No chronic infarcts are identified.  No ventriculomegaly. No midline shift, mass effect, or evidence of intracranial mass lesion. Negative pituitary, cervicomedullary junction visualized cervical spine. Visible internal auditory structures appear normal. Mastoids are clear.  Small left sphenoid sinus mucous retention cyst. Minor ethmoid sinus mucosal thickening. Visualized orbit soft tissues are within normal limits. Normal bone marrow signal. Visualized  scalp soft tissues are within normal limits.  In the left parotid space there is a 17 mm nodule with subtle dark T2 signal (series 11, image 14, and mildly restricted diffusion (series 6, image 15).  MRA HEAD FINDINGS  Antegrade flow in the posterior circulation with dominant distal left vertebral artery. Normal PICA origins. Normal  vertebrobasilar junction. No basilar stenosis. SCA and PCA origins are normal. Bilateral PCA branches are normal. Posterior communicating arteries are diminutive or absent.  Antegrade flow in the right ICA siphon which is irregular and appears up to moderately stenosed in the cavernous segment (series 3, image 72). There is also a small laterally directed cavernous aneurysm or ulcerated plaque (series 3, image 72 long arrow). The supra clinoid right ICA is patent. The right ICA terminus is normally patent.  Decreased antegrade flow in the left ICA siphon from the distal cervical levels. Moderate to severe irregularity in the left cavernous segment might be the source of hemodynamically significant stenosis (series 305, image 5). Nonetheless, the left supra clinoid ICA and terminus remain patent.  Normal right MCA and ACA origins and branches. Anterior communicating artery is normal.  On the left there is decreased flow signal throughout the left MCA branches, but no major branch occlusion or focal stenosis identified. Left ACA flow signal appears to be more symmetric.  IMPRESSION: 1. Scattered and patchy acute left MCA infarcts. No associated mass effect or hemorrhage. 2. MRA appearance indicating hemodynamically significant stenosis of the left ICA. The causative lesion might be in the left cavernous segment, or alternatively could be upstream in the left neck. 3. Subsequently decreased flow signal at the left ICA terminus and in the left MCA branches, but no major circle of Willis branch occlusion. 4. There is also extensive atherosclerosis of the right ICA cavernous segment with at least moderate stenosis on that side. 5. Unexpected finding of 17 mm left parotid space nodule with suspicious MRI characteristics. 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 Recommend nonurgent ENT followup once the patient recovers from the acute events.  Study discussed by telephone with Dr. Shanon Brow Garnetta Fedrick On 11/02/2013 at 10:58 .    Electronically Signed   By: Lars Pinks M.D.   On: 11/02/2013 11:10   Mr Jodene Nam Head/brain Wo Cm  11/02/2013   CLINICAL DATA:  63 year old female with hypertension, diabetes, visual disturbances on the left with right side weakness. Right upper extremity weakness. Initial encounter.  EXAM: MRI HEAD WITHOUT CONTRAST  MRA HEAD WITHOUT CONTRAST  TECHNIQUE: Multiplanar, multiecho pulse sequences of the brain and surrounding structures were obtained without intravenous contrast. Angiographic images of the head were obtained using MRA technique without contrast.  COMPARISON:  Head CT without contrast 10/1913.  FINDINGS: MRI HEAD FINDINGS  Scattered mostly subcentimeter areas of restricted diffusion in the left MCA territory. Lesions involving from the left superior temporal gyrus (series 5, image 14), to the left periatrial white matter, to the sensory cortex and motor strip subcortical white matter (more confluent, up to 12 mm series 5, image 25). Associated mild T2 and FLAIR hyperintensity. No associated mass effect. No evidence of associated hemorrhage.  No right hemisphere or posterior fossa restricted diffusion. Major intracranial vascular flow voids are preserved. Outside of the affected areas, gray and white matter signal is within normal limits. No chronic infarcts are identified.  No ventriculomegaly. No midline shift, mass effect, or evidence of intracranial mass lesion. Negative pituitary, cervicomedullary junction visualized cervical spine. Visible internal auditory structures appear normal. Mastoids are  clear.  Small left sphenoid sinus mucous retention cyst. Minor ethmoid sinus mucosal thickening. Visualized orbit soft tissues are within normal limits. Normal bone marrow signal. Visualized scalp soft tissues are within normal limits.  In the left parotid space there is a 17 mm nodule with subtle dark T2 signal (series 11, image 14, and mildly restricted diffusion (series 6, image 15).  MRA HEAD FINDINGS   Antegrade flow in the posterior circulation with dominant distal left vertebral artery. Normal PICA origins. Normal vertebrobasilar junction. No basilar stenosis. SCA and PCA origins are normal. Bilateral PCA branches are normal. Posterior communicating arteries are diminutive or absent.  Antegrade flow in the right ICA siphon which is irregular and appears up to moderately stenosed in the cavernous segment (series 3, image 72). There is also a small laterally directed cavernous aneurysm or ulcerated plaque (series 3, image 72 long arrow). The supra clinoid right ICA is patent. The right ICA terminus is normally patent.  Decreased antegrade flow in the left ICA siphon from the distal cervical levels. Moderate to severe irregularity in the left cavernous segment might be the source of hemodynamically significant stenosis (series 305, image 5). Nonetheless, the left supra clinoid ICA and terminus remain patent.  Normal right MCA and ACA origins and branches. Anterior communicating artery is normal.  On the left there is decreased flow signal throughout the left MCA branches, but no major branch occlusion or focal stenosis identified. Left ACA flow signal appears to be more symmetric.  IMPRESSION: 1. Scattered and patchy acute left MCA infarcts. No associated mass effect or hemorrhage. 2. MRA appearance indicating hemodynamically significant stenosis of the left ICA. The causative lesion might be in the left cavernous segment, or alternatively could be upstream in the left neck. 3. Subsequently decreased flow signal at the left ICA terminus and in the left MCA branches, but no major circle of Willis branch occlusion. 4. There is also extensive atherosclerosis of the right ICA cavernous segment with at least moderate stenosis on that side. 5. Unexpected finding of 17 mm left parotid space nodule with suspicious MRI characteristics. 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 Recommend nonurgent ENT followup once  the patient recovers from the acute events.  Study discussed by telephone with Dr. Shanon Brow Tysin Salada On 11/02/2013 at 10:58 .   Electronically Signed   By: Lars Pinks M.D.   On: 11/02/2013 11:10         Subjective: Patient denies fevers, chills, chest pain, shortness breath, nausea, vomiting, diarrhea, abdominal pain.  Objective: Filed Vitals:   11/04/13 1200 11/04/13 1205 11/04/13 1245 11/04/13 1300  BP: 101/42 107/44 110/43 107/39  Pulse: 72 72 75 76  Temp:      TempSrc:      Resp: 13 13 17 13   Height:      Weight:      SpO2: 97% 97% 92% 94%    Intake/Output Summary (Last 24 hours) at 11/04/13 1319 Last data filed at 11/04/13 1300  Gross per 24 hour  Intake   1400 ml  Output    400 ml  Net   1000 ml   Weight change:  Exam:   General:  Pt is alert, follows commands appropriately, not in acute distress  HEENT: No icterus, No thrush,[place]/AT; left neck incision well approximated without erythema or drainage.  Cardiovascular: RRR, S1/S2, no rubs, no gallops  Respiratory: CTA bilaterally, no wheezing, no crackles, no rhonchi  Abdomen: Soft/+BS, non tender, non distended, no guarding  Extremities: No  edema, No lymphangitis, No petechiae, No rashes, no synovitis  Data Reviewed: Basic Metabolic Panel:  Recent Labs Lab 11/01/13 0930 11/01/13 2127 11/04/13 0515  NA 143  --  142  K 3.4*  --  4.0  CL 101  --  106  CO2 25  --  22  GLUCOSE 127*  --  104*  BUN 9  --  12  CREATININE 0.90 0.88 0.80  CALCIUM 10.3  --  9.5   Liver Function Tests:  Recent Labs Lab 11/01/13 0930  AST 14  ALT 15  ALKPHOS 162*  BILITOT <0.2*  PROT 8.2  ALBUMIN 4.3   No results found for this basename: LIPASE, AMYLASE,  in the last 168 hours No results found for this basename: AMMONIA,  in the last 168 hours CBC:  Recent Labs Lab 11/01/13 0930 11/01/13 2127  WBC 12.6* 11.8*  NEUTROABS 7.8*  --   HGB 13.6 11.8*  HCT 41.1 36.1  MCV 81.7 81.5  PLT 213 186   Cardiac  Enzymes:  Recent Labs Lab 11/01/13 0930  TROPONINI <0.30   BNP: No components found with this basename: POCBNP,  CBG:  Recent Labs Lab 11/03/13 1147 11/03/13 1612 11/03/13 2127 11/04/13 0956 11/04/13 1216  GLUCAP 204* 137* 97 165* 161*    Recent Results (from the past 240 hour(s))  SURGICAL PCR SCREEN     Status: None   Collection Time    11/04/13  6:10 AM      Result Value Ref Range Status   MRSA, PCR NEGATIVE  NEGATIVE Final   Staphylococcus aureus NEGATIVE  NEGATIVE Final   Comment:            The Xpert SA Assay (FDA     approved for NASAL specimens     in patients over 48 years of age),     is one component of     a comprehensive surveillance     program.  Test performance has     been validated by [employer] for patients greater     than or equal to 51 year old.     It is not intended     to diagnose infection nor to     guide or monitor treatment.     Scheduled Meds: . amLODipine  10 mg Oral Daily  . aspirin  300 mg Rectal Daily   Or  . aspirin  325 mg Oral Daily  . atorvastatin  40 mg Oral q1800  . buPROPion  300 mg Oral Daily  . cefUROXime (ZINACEF)  IV  1.5 g Intravenous Q12H  . [START ON 11/05/2013] docusate sodium  100 mg Oral Daily  . [START ON 11/05/2013] enoxaparin (LOVENOX) injection  40 mg Subcutaneous Q24H  . insulin aspart  0-15 Units Subcutaneous TID WC  . insulin aspart  0-5 Units Subcutaneous QHS  . insulin aspart  4 Units Subcutaneous TID WC  . insulin glargine  44 Units Subcutaneous QHS  . levothyroxine  150 mcg Oral QAC breakfast  . lisinopril  5 mg Oral Daily  . nicotine  14 mg Transdermal Daily  . pantoprazole  40 mg Oral Daily   Continuous Infusions: . sodium chloride 75 mL/hr at 11/04/13 1300  . DOPamine       Khayman Kirsch, DO  [facility] Pager [phone]  If 7PM-7AM, please contact night-coverage [email or web address] Password [email or web address] 11/04/2013, 1:19 PM   LOS: 3 days

## 2013-11-04 NOTE — Transfer of Care (Signed)
Immediate Anesthesia Transfer of Care Note  Patient: Sonia Skinner  Procedure(s) Performed: Procedure(s): ENDARTERECTOMY CAROTID (Left)  Patient Location: PACU  Anesthesia Type:General  Level of Consciousness: awake, alert , oriented and patient cooperative  Airway & Oxygen Therapy: Patient Spontanous Breathing and Patient connected to nasal cannula oxygen  Post-op Assessment: Report given to PACU RN, Post -op Vital signs reviewed and stable, Patient moving all extremities and Patient able to stick tongue midline  Post vital signs: Reviewed and stable  Complications: No apparent anesthesia complications

## 2013-11-04 NOTE — Interval H&P Note (Signed)
History and Physical Interval Note:  11/04/2013 6:58 AM  Sonia Skinner  has presented today for surgery, with the diagnosis of left carotid stenosis  The various methods of treatment have been discussed with the patient and family. After consideration of risks, benefits and other options for treatment, the patient has consented to  Procedure(s): ENDARTERECTOMY CAROTID (Left) as a surgical intervention .  The patient's history has been reviewed, patient examined, no change in status, stable for surgery.  I have reviewed the patient's chart and labs.  Questions were answered to the patient's satisfaction.     DICKSON,CHRISTOPHER S

## 2013-11-04 NOTE — Op Note (Signed)
    NAME: Sonia Skinner    MRN: [id number] DOB: May 02, 1951    DATE OF OPERATION: 11/04/2013  PREOP DIAGNOSIS: Symptomatic left carotid stenosis  POSTOP DIAGNOSIS: same  PROCEDURE: left carotid endarterectomy with bovine pericardial patch angioplasty  SURGEON: Judeth Cornfield. Scot Dock, MD, FACS  ASSIST: Leontine Locket, PA  ANESTHESIA: Gen.   EBL: minimal  INDICATIONS: Sonia Skinner is a 63 y.o. female who presented with a left brain stroke. She had a tight left carotid stenosis and also disease in the carotid siphon bilaterally. It was felt by CT angiogram that she had some clot at the carotid bulb and therefore carotid endarterectomy was recommended.   FINDINGS: there was a small amount of fresh clot present at the carotid bifurcation. The stenosis was 95%.  TECHNIQUE: the patient was taken to the operating room after monitoring lines were placed by anesthesia. The patient received a general anesthetic. After careful positioning, the left neck was prepped and draped in usual sterile fashion. An incision was made along the anterior border of the sternocleidomastoid and the dissection carried down to the common carotid artery which was dissected free and controlled with Rummel tourniquet. The facial vein was divided between 2-0 silk ties. I then dissected up to the internal carotid artery which was controlled with a vessel loop. The superior thyroid artery and external carotid arteries were controlled with loops. The patient was heparinized. Clamps were then placed on the internal then the external then the common carotid artery. A longitudinal arteriotomy was made in the common carotid artery and this was extended through the plaque into the internal carotid artery. There was fresh clot present within the carotid bifurcation. A 10 shunt was placed into the internal carotid artery, backbled and then placed into the common carotid artery and secured with Rummel tourniquet. Flow was reestablished through  the shunt. An endarterectomy plane was established proximally and the plaque was sharply divided. Eversion endarterectomy was performed of the external carotid artery. Distally there was a nice tapering the plaque and no tacking sutures were required. The artery was irrigated with copious amounts of heparin and dextran all loose debris removed. A bovine pericardial patch was then sewn using continuous 6-0 Prolene suture. Prior to completing the patch closure, the shunt was removed. The artery was backbled and flushed appropriately and the anastomosis completed. Flow was reestablished first to the external carotid artery and then into the internal carotid artery. At the completion there was a good pulse distal to the patch and good Doppler signals. The heparin was partially reversed with protamine. The wounds closed the deep layer 3-0 Vicryl. The platysma was closed with running 3-0 Vicryl. The skin was closed with 4-0 subcuticular stitch. Dermabond was applied. The patient tolerated the procedure well and awoke neurologically intact. All needle and sponge counts were correct.  Deitra Mayo, MD, FACS [facility]  DATE OF DICTATION:   11/04/2013

## 2013-11-04 NOTE — Progress Notes (Signed)
Occupational Therapy Cancellation Note:  OT cancelled this date due to pt in surgery.  Will re-attempt tomorrow.   Lucille Passy, OTR/L [phone]

## 2013-11-04 NOTE — Anesthesia Procedure Notes (Signed)
Procedure Name: Intubation Date/Time: 11/04/2013 7:44 AM Performed by: Julian Reil Pre-anesthesia Checklist: Patient identified, Emergency Drugs available, Suction available and Patient being monitored Patient Re-evaluated:Patient Re-evaluated prior to inductionOxygen Delivery Method: Circle system utilized Preoxygenation: Pre-oxygenation with 100% oxygen Intubation Type: IV induction Ventilation: Mask ventilation without difficulty Laryngoscope Size: Mac and 3 Grade View: Grade I Tube type: Oral Tube size: 7.5 mm Number of attempts: 1 Airway Equipment and Method: Stylet and LTA kit utilized Placement Confirmation: ETT inserted through vocal cords under direct vision,  positive ETCO2 and breath sounds checked- equal and bilateral Secured at: 22 cm Tube secured with: Tape Dental Injury: Teeth and Oropharynx as per pre-operative assessment

## 2013-11-04 NOTE — Anesthesia Postprocedure Evaluation (Signed)
Anesthesia Post Note  Patient: Sonia Skinner  Procedure(s) Performed: Procedure(s) (LRB): ENDARTERECTOMY CAROTID (Left)  Anesthesia type: general  Patient location: PACU  Post pain: Pain level controlled  Post assessment: Patient's Cardiovascular Status Stable  Last Vitals:  Filed Vitals:   11/04/13 1644  BP:   Pulse: 81  Temp:   Resp: 13    Post vital signs: Reviewed and stable  Level of consciousness: sedated  Complications: No apparent anesthesia complications

## 2013-11-04 NOTE — Preoperative (Signed)
Beta Blockers   Reason not to administer Beta Blockers:Not Applicable 

## 2013-11-04 NOTE — Anesthesia Preprocedure Evaluation (Addendum)
Anesthesia Evaluation  Patient identified by MRN, date of birth, ID band Patient awake    Reviewed: Allergy & Precautions, H&P , NPO status , Patient's Chart, lab work & pertinent test results  Airway Mallampati: I TM Distance: >3 FB Neck ROM: Full    Dental   Pulmonary Current Smoker,          Cardiovascular hypertension, Pt. on medications     Neuro/Psych    GI/Hepatic GERD-  Controlled and Medicated,  Endo/Other  diabetes, Well Controlled, Type 2, Insulin Dependent  Renal/GU      Musculoskeletal   Abdominal   Peds  Hematology   Anesthesia Other Findings   Reproductive/Obstetrics                          Anesthesia Physical Anesthesia Plan  ASA: III  Anesthesia Plan: General   Post-op Pain Management:    Induction: Intravenous  Airway Management Planned: Oral ETT  Additional Equipment: Arterial line  Intra-op Plan:   Post-operative Plan: Extubation in OR  Informed Consent: I have reviewed the patients History and Physical, chart, labs and discussed the procedure including the risks, benefits and alternatives for the proposed anesthesia with the patient or authorized representative who has indicated his/her understanding and acceptance.     Plan Discussed with: CRNA and Surgeon  Anesthesia Plan Comments: (Allergic apparently to [facility] based local Anesthetics manifested by a whole body rash after a dental visit.)       Anesthesia Quick Evaluation

## 2013-11-04 NOTE — H&P (View-Only) (Signed)
[facility]  Patient name: Sonia Skinner MRN: [id number] DOB: 11/09/1950 Sex: female  REASON FOR CONSULT: Left Brain Stroke with Left carotid stenosis. Consult from [facility]  HPI: Sonia Skinner is a 63 y.o. female who was admitted on 11/01/13 with right sided weakness and amaurosis fugax of the left eye. She developed the sudden onset of paresthesias and weakness in the left upper extremity on Thursday morning. He symptoms have gradually improved area she states that the arm still feels slightly sluggish but then her strength has for the most part returned. She had no lower extremity symptoms. She had no expressive or receptive aphasia. She had been having visual disturbances in her left eye for the last several days prior to this in which her vision would become dark. She is unaware of any prior history of stroke, TIAs, expressive or receptive aphasia, or amaurosis fugax. She states that she was not on aspirin prior to this event. She has been started on aspirin and has been started on a statin.  Her risk factors for peripheral vascular disease include hypertension, tobacco use, and a family history of premature cardiovascular disease.  Past Medical History  Diagnosis Date  . Hypertension   . Thyroid disease   . Complication of anesthesia     ALLERY TO ESTER BASE  . Diabetes mellitus     INSULIN DEPENDENT  . GERD (gastroesophageal reflux disease)    Family History  Problem Relation Age of Onset  . Diabetes Mother   . Heart disease Mother   . Heart disease Father   . Hypertension Sister   . Diabetes Sister    SOCIAL HISTORY: History  Substance Use Topics  . Smoking status: Current Every Day Smoker -- 0.20 packs/day for 20 years    Types: Cigarettes  . Smokeless tobacco: Never Used  . Alcohol Use: No   Allergies  Allergen Reactions  . Anesthetics, Ester Anaphylaxis   Current Facility-Administered Medications  Medication Dose Route  Frequency Provider Last Rate Last Dose  . acetaminophen (TYLENOL) tablet 650 mg  650 mg Oral Q6H PRN Ritta Slot, NP   650 mg at 11/03/13 1036  . amLODipine (NORVASC) tablet 10 mg  10 mg Oral Daily Charlynne Cousins, MD   10 mg at 11/03/13 1036  . aspirin suppository 300 mg  300 mg Rectal Daily Charlynne Cousins, MD       Or  . aspirin tablet 325 mg  325 mg Oral Daily Charlynne Cousins, MD   325 mg at 11/03/13 1037  . atorvastatin (LIPITOR) tablet 40 mg  40 mg Oral q1800 Orson Eva, MD   40 mg at 11/02/13 1720  . buPROPion (WELLBUTRIN XL) 24 hr tablet 300 mg  300 mg Oral Daily Charlynne Cousins, MD   300 mg at 11/03/13 1037  . heparin injection 5,000 Units  5,000 Units Subcutaneous 3 times per day Charlynne Cousins, MD   5,000 Units at 11/03/13 0525  . HYDROcodone-acetaminophen (NORCO/VICODIN) 5-325 MG per tablet 1 tablet  1 tablet Oral Q6H PRN David L Rinehuls, PA-C      . insulin aspart (novoLOG) injection 0-15 Units  0-15 Units Subcutaneous TID WC Charlynne Cousins, MD   5 Units at 11/03/13 1232  . insulin aspart (novoLOG) injection 0-5 Units  0-5 Units Subcutaneous QHS Charlynne Cousins, MD      . insulin aspart (novoLOG) injection 4 Units  4 Units Subcutaneous TID WC Tammi Klippel  Aileen Fass, MD   4 Units at 11/03/13 1233  . insulin glargine (LANTUS) injection 40 Units  40 Units Subcutaneous QHS Charlynne Cousins, MD   40 Units at 11/02/13 2151  . levothyroxine (SYNTHROID, LEVOTHROID) tablet 150 mcg  150 mcg Oral QAC breakfast Charlynne Cousins, MD   150 mcg at 11/03/13 0724  . nicotine (NICODERM CQ - dosed in mg/24 hours) patch 14 mg  14 mg Transdermal Daily Charlynne Cousins, MD   14 mg at 11/03/13 1037  . pantoprazole (PROTONIX) EC tablet 40 mg  40 mg Oral Daily Charlynne Cousins, MD   40 mg at 11/03/13 1033  . senna-docusate (Senokot-S) tablet 1 tablet  1 tablet Oral QHS PRN Charlynne Cousins, MD       REVIEW OF SYSTEMS: Valu.Nieves ] denotes positive finding; [  ] denotes  negative finding CARDIOVASCULAR:  [ ]  chest pain   [ ]  chest pressure   [ ]  palpitations   [ ]  orthopnea   [ ]  dyspnea on exertion   [ ]  claudication   [ ]  rest pain   [ ]  DVT   [ ]  phlebitis PULMONARY:   [ ]  productive cough   [ ]  asthma   [ ]  wheezing NEUROLOGIC:   Valu.Nieves ] weakness  Valu.Nieves ] paresthesias  [ ]  aphasia  Valu.Nieves ] amaurosis  [ ]  dizziness HEMATOLOGIC:   [ ]  bleeding problems   [ ]  clotting disorders MUSCULOSKELETAL:  [ ]  joint pain   [ ]  joint swelling [ ]  leg swelling GASTROINTESTINAL: [ ]   blood in stool  [ ]   hematemesis GENITOURINARY:  [ ]   dysuria  [ ]   hematuria PSYCHIATRIC:  [ ]  history of major depression INTEGUMENTARY:  [ ]  rashes  [ ]  ulcers CONSTITUTIONAL:  [ ]  fever   [ ]  chills  PHYSICAL EXAM: Filed Vitals:   11/03/13 0120 11/03/13 0529 11/03/13 0931 11/03/13 1332  BP: 134/71 132/72 136/68 146/69  Pulse: 98 82 91 93  Temp: 97.8 F (36.6 C) 98.4 F (36.9 C) 98.4 F (36.9 C) 99.5 F (37.5 C)  TempSrc: Oral Oral Oral Oral  Resp: 18 18 18 18   Height:      Weight:      SpO2: 100% 99% 97% 98%   Body mass index is 26.7 kg/(m^2). GENERAL: The patient is a well-nourished female, in no acute distress. The vital signs are documented above. CARDIOVASCULAR: There is a regular rate and rhythm. She has bilateral carotid bruits. She has palpable radial, femoral, and dorsalis pedis pulses bilaterally. PULMONARY: There is good air exchange bilaterally without wheezing or rales. ABDOMEN: Soft and non-tender with normal pitched bowel sounds.  MUSCULOSKELETAL: There are no major deformities or cyanosis. NEUROLOGIC: she has very minimal right upper extremity weakness. She has no weakness on the left side and no lower extremity weakness. SKIN: There are no ulcers or rashes noted. PSYCHIATRIC: The patient has a normal affect.  DATA:  Lab Results  Component Value Date   WBC 11.8* 11/01/2013   HGB 11.8* 11/01/2013   HCT 36.1 11/01/2013   MCV 81.5 11/01/2013   PLT 186 11/01/2013    Lab Results  Component Value Date   NA 143 11/01/2013   K 3.4* 11/01/2013   CL 101 11/01/2013   CO2 25 11/01/2013   Lab Results  Component Value Date   CREATININE 0.88 11/01/2013   Lab Results  Component Value Date   INR 0.95 11/01/2013   Lab Results  Component Value Date   HGBA1C 10.5* 11/02/2013   CBG (last 3)   Recent Labs  11/03/13 0801 11/03/13 1147 11/03/13 1612  GLUCAP 189* 204* 137*   CAROTID DUPLEX: > 80% Left ICA stenosis; < 39% Right ICA stenosis. It is difficult to appreciate the thrombus from the duplex scan which I reviewed.  MRI/MRA: Scattered acute L MCA infarcts. No hemorrhage. Left cavernous segment stenosis and proximal L ICA stenosis. Also Right ICA cavernous segment stenosis.   CT ANGIO NECK: >80% narrowing of carotid bulb on the left, with question of thrombus tailing from this region.   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 EF 60-65%. No significant valvular disease.   EKG: NSR  MEDICAL ISSUES:  SYMPTOMATIC LEFT CAROTID STENOSIS: This patient presents with a left brain stroke. She has scattered acute infarcts in the left middle cerebral artery territory. She has a tight  Greater than 80% left internal carotid artery stenosis with possibly some thrombus trailing from the plaque. In addition, she does have disease of the left cavernous segment with likely significant stenosis. Given that she potentially has thrombus in the left carotid system I have recommended that we proceed with left carotid endarterectomy tomorrow morning. I am concerned that the thrombus could potentially embolize and resulted in another significant stroke. Although she has disease in the cavernous sinus, I do not think that cerebral arteriography and consideration for stenting would be wise given the thrombus potentially in the carotid bulb. Although the disease in the carotid siphon is a concern,  the patient has severe disease at the carotid bifurcation with thrombus and therefore I think this is the more pressing issue.  I've spoken with the Hospitalist and the patient will be heparinized tonight. The the heparin will be held in the morning prior to surgery. I have reviewed the indications for carotid endarterectomy, that is to lower the risk of future stroke. I have also reviewed the potential complications of surgery, including but not limited to: bleeding, stroke (perioperative risk 1-2%), MI, nerve injury of other unpredictable medical problems. All of the patients questions were answered and they are agreeable to proceed with surgery.   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 Constance Holster and he agrees that given the severity of the carotid disease the parotid workup is not urgent. This can be addressed electively when she is recovered from her stroke.  [place] [facility] [place] [facility]: [phone]

## 2013-11-05 ENCOUNTER — Encounter: Payer: Self-pay | Admitting: *Deleted

## 2013-11-05 DIAGNOSIS — E785 Hyperlipidemia, unspecified: Secondary | ICD-10-CM

## 2013-11-05 LAB — CBC
HCT: 31.9 % — ABNORMAL LOW (ref 36.0–46.0)
Hemoglobin: 10.4 g/dL — ABNORMAL LOW (ref 12.0–15.0)
MCH: 27 pg (ref 26.0–34.0)
MCHC: 32.6 g/dL (ref 30.0–36.0)
MCV: 82.9 fL (ref 78.0–100.0)
Platelets: 174 10*3/uL (ref 150–400)
RBC: 3.85 MIL/uL — ABNORMAL LOW (ref 3.87–5.11)
RDW: 14.4 % (ref 11.5–15.5)
WBC: 12.9 10*3/uL — ABNORMAL HIGH (ref 4.0–10.5)

## 2013-11-05 LAB — GLUCOSE, CAPILLARY
Glucose-Capillary: 91 mg/dL (ref 70–99)
Glucose-Capillary: 135 mg/dL — ABNORMAL HIGH (ref 70–99)
Glucose-Capillary: 166 mg/dL — ABNORMAL HIGH (ref 70–99)
Glucose-Capillary: 161 mg/dL — ABNORMAL HIGH (ref 70–99)

## 2013-11-05 LAB — BASIC METABOLIC PANEL
BUN: 12 mg/dL (ref 6–23)
CO2: 22 mEq/L (ref 19–32)
Calcium: 8.6 mg/dL (ref 8.4–10.5)
Chloride: 108 mEq/L (ref 96–112)
Creatinine, Ser: 0.88 mg/dL (ref 0.50–1.10)
GFR calc Af Amer: 79 mL/min — ABNORMAL LOW (ref >90)
GFR calc non Af Amer: 68 mL/min — ABNORMAL LOW (ref >90)
Glucose, Bld: 84 mg/dL (ref 70–99)
Potassium: 3.7 mEq/L (ref 3.7–5.3)
Sodium: 143 mEq/L (ref 137–147)

## 2013-11-05 NOTE — Progress Notes (Addendum)
[facility] Progress Note 11/05/2013 7:20 AM 1 Day Post-Op  Subjective:  No complaints this am.  RN states she had nausea last pm.  Tm 99.1 now 98.9 HR 60's-90's regular [id number] systolic [id number] this am, but now 89% on RA  Filed Vitals:   11/05/13 0405  BP: 125/42  Pulse: 82  Temp: 98.9 F (37.2 C)  Resp: 10    Physical Exam: Neuro:  Intact Incision:  C/d/i  CBC    Component Value Date/Time   WBC 12.9* 11/05/2013 0244   RBC 3.85* 11/05/2013 0244   HGB 10.4* 11/05/2013 0244   HCT 31.9* 11/05/2013 0244   PLT 174 11/05/2013 0244   MCV 82.9 11/05/2013 0244   MCH 27.0 11/05/2013 0244   MCHC 32.6 11/05/2013 0244   RDW 14.4 11/05/2013 0244   LYMPHSABS 3.9 11/01/2013 0930   MONOABS 0.7 11/01/2013 0930   EOSABS 0.2 11/01/2013 0930   BASOSABS 0.0 11/01/2013 0930   BMET    Component Value Date/Time   NA 143 11/05/2013 0244   K 3.7 11/05/2013 0244   CL 108 11/05/2013 0244   CO2 22 11/05/2013 0244   GLUCOSE 84 11/05/2013 0244   BUN 12 11/05/2013 0244   CREATININE 0.88 11/05/2013 0244   CREATININE 1.07 10/15/2013 0929   CALCIUM 8.6 11/05/2013 0244   GFRNONAA 68* 11/05/2013 0244   GFRAA 79* 11/05/2013 0244    Assessment/Plan:  This is a 63 y.o. female who is s/p left CEA 1 Day Post-Op  -pt is doing well this am. -pt neuro exam is in tact -pt has not ambulated-she needs to walk this am -pt has voided -pt is doing well from surgery standpoint.  If okay medically, okay to discharge from vascular standpoint.  She may need PT to evaluate her this am.  If not discharged, okay to transfer to telemetry floor. -lantus held last pm  -encouraged to stop smoking, exercise, and eat healthier.   -pt has been started on a statin  Leontine Locket, PA-C [facility] [phone]  Agree with above.  Ok for D/C from vascular standpoint if she does not need any more PTx  Deitra Mayo, MD, FACS Beeper [phone] 11/05/2013  For VQI Use Only  PRE-ADM  LIVING: Valu.Nieves ] Home, [ ]  Nursing home, [ ]  Homeless  AMB STATUS: Valu.Nieves ] Walking, [ ]  Walking w/ Assistance, [ ]  Wheelchair, [ ] Bed ridden  RECENT HEART ATTACK (<6 mon): No  CAD Sx: Valu.Nieves ] No, [ ]  Asx, h/o MI, [ ]  Stable angina, [ ]  Unstable angina  PRIOR CHF: Valu.Nieves ] No, [ ]  Asx, [ ]  Mild, [ ]  Moderate, [ ]  Severe  STRESS TEST: Valu.Nieves ] No, [ ]  Normal, [ ]  + ischemia, [ ]  + MI, [ ]  Both  ASA: yes   Statin: yes

## 2013-11-05 NOTE — Progress Notes (Signed)
[facility] PROGRESS NOTE  Sonia Skinner [id number] DOB: Feb 06, 1951 DOA: 11/01/2013 PCP: Angelica Chessman, MD  Assessment/Plan: Acute left MCA infarct  -MRI brain showed patchy acute left MCA infarct  -MRI of brain suggests possible left ICA stenosis in the cavernous segment versus left neck  -carotid u/s-->80% L-carotid stenosis -echocardiogram--EF60-65%, no WMA, grade 1 diastolic dysfx; no embolic source -Hemoglobin A1c 10.3  -LDL 147  -PT/OT  -Appreciate neurology followup  -continue ASA 325mg    Left Carotid Stenosis  -CTA of neck showed "at least" 80% stenosis of L-ICA with possible tailing thrombus  -appreciate Dr. Scot Dock -s/p L-CEA 11/05/13 -continue ASA 325mg     Anorexia/dehydration -poor intake 2/2 nausea -cont IVFs  Left parotid nodule  -Appears suspicious on MRI-17 mm incidental finding  -Discussed with ENT, Dr. Constance Holster  -followup appt made for 11/12/13@130PM    Hypertension  -hold lisinopril and amlodipine - BP was soft post op -given nausea check OVS as precaution  Diabetes mellitus type 2  -cont Lantus 44 units -CBGs much better controlled -Patient had mild episode of hypoglycemia on 11/01/2013 which may have been partly due to the patient's glipizide  -cont SSI and meal coverage  Tobacco abuse  -Tobacco cessation discussed  Hypothyroidism  -Continue Synthroid   hyperlipidemia  -Start Lipitor    Family Communication: sister at beside updated on 11/02/13 Disposition Plan: Home when medically stable-transfer to floor- PT rec HHPT   Procedures/Studies: Dg Chest 2 View  11/01/2013   CLINICAL DATA:  Stroke.  Smoker.  EXAM: CHEST  2 VIEW  COMPARISON:  None.  FINDINGS: Lungs are clear. Heart size is normal. No pneumothorax or pleural effusion.  IMPRESSION: Negative chest.   Electronically Signed   By: Inge Rise M.D.   On: 11/01/2013 21:33   Ct Head Wo Contrast  11/01/2013   CLINICAL DATA:  Right-sided weakness for 2 days  EXAM: CT HEAD  WITHOUT CONTRAST  TECHNIQUE: Contiguous axial images were obtained from the base of the skull through the vertex without intravenous contrast.  COMPARISON:  None.  FINDINGS: There is no evidence of mass effect, midline shift or extra-axial fluid collections. There is no evidence of a space-occupying lesion or intracranial hemorrhage. There is an area of low-attenuation at the gray-white junction in the left frontal lobe superiorly (image 22) which may reflect volume averaging versus a subacute infarct. There are no other areas of concerning for cortical infarction.  The ventricles and sulci are appropriate for the patient's age. The basal cisterns are patent.  Visualized portions of the orbits are unremarkable. There is mild sphenoid sinus mucosal thickening.  The osseous structures are unremarkable.  IMPRESSION: There is an area of low-attenuation at the gray-white junction in the left frontal lobe superiorly (image 22) which may reflect volume averaging versus a subacute infarct.   Electronically Signed   By: Kathreen Devoid   On: 11/01/2013 10:10   Ct Angio Neck W/cm &/or Wo/cm  11/03/2013   CLINICAL DATA:  Left MCA region infarctions. History of carotid stenoses.  EXAM: CT ANGIOGRAPHY NECK  TECHNIQUE: Multidetector CT imaging of the neck was performed using the standard protocol during bolus administration of intravenous contrast. Multiplanar CT image reconstructions and MIPs were obtained to evaluate the vascular anatomy. Carotid stenosis measurements (when applicable) are obtained utilizing NASCET criteria, using the distal internal carotid diameter as the denominator.  CONTRAST:  49mL OMNIPAQUE IOHEXOL 350 MG/ML SOLN  COMPARISON:  MRI 11/02/2013  FINDINGS: No focal lung lesions. No superior mediastinal mass. There is  atherosclerosis of the aorta. Branching pattern of the brachiocephalic vessels from the arch is normal. Atherosclerotic disease at the left subclavian origin results in narrowing of 30%. No  stenosis of the innominate or left common carotid artery origin.  Right common carotid artery is widely patent to the bifurcation region. There is atherosclerotic plaque at the carotid bifurcation but the minimal diameter of the ICA is 4 mm. Compared to a more distal cervical ICA diameter of 4 mm, there is no stenosis.  The left common carotid artery is widely patent to its distal extent. There is soft plaque at the distal common carotid artery with narrowing of the lumen from its expected diameter of 5 mm to a diameter of 2.5 mm just proximal to the bifurcation. This indicates a 50% stenosis. There is extensive primarily soft plaque affecting the proximal internal carotid artery with narrowing of the lumen to 1 mm or less. There may be an intraluminal filling defect extending from this region. The more distal cervical ICA is narrowed because of reduced inflow. The stenosis at the proximal ICA is at least 80% and possibly a severe is 90%. The left ICA does show flow through the skullbase. There is stenosis of the left external carotid artery as well.  Both vertebral artery origins are patent. Left vertebral artery is dominant. There is mild narrowing of both proximal vertebral arteries, but stenosis is no more than 30%. No stenosis in the cervical region. Both vessels show flow to the basilar.  Review of the MIP images confirms the above findings.  IMPRESSION: Non stenotic atherosclerotic plaque at the carotid bifurcation on the right.  Severe atherosclerotic disease at the carotid bifurcation region on the left. 50% stenosis of the common carotid artery just proximal to the bifurcation. Severe narrowing and irregularity of the internal carotid artery bulb with stenosis at least 80% and possibly greater. There may be some thrombus tailing from this region.  These results were called by telephone at the time of interpretation on 11/03/2013 at 4:32 PM to Dr. Shanon Brow TAT , who verbally acknowledged these results.    Electronically Signed   By: Nelson Chimes M.D.   On: 11/03/2013 16:32   Mr Brain Wo Contrast  11/02/2013   CLINICAL DATA:  63 year old female with hypertension, diabetes, visual disturbances on the left with right side weakness. Right upper extremity weakness. Initial encounter.  EXAM: MRI HEAD WITHOUT CONTRAST  MRA HEAD WITHOUT CONTRAST  TECHNIQUE: Multiplanar, multiecho pulse sequences of the brain and surrounding structures were obtained without intravenous contrast. Angiographic images of the head were obtained using MRA technique without contrast.  COMPARISON:  Head CT without contrast 10/1913.  FINDINGS: MRI HEAD FINDINGS  Scattered mostly subcentimeter areas of restricted diffusion in the left MCA territory. Lesions involving from the left superior temporal gyrus (series 5, image 14), to the left periatrial white matter, to the sensory cortex and motor strip subcortical white matter (more confluent, up to 12 mm series 5, image 25). Associated mild T2 and FLAIR hyperintensity. No associated mass effect. No evidence of associated hemorrhage.  No right hemisphere or posterior fossa restricted diffusion. Major intracranial vascular flow voids are preserved. Outside of the affected areas, gray and white matter signal is within normal limits. No chronic infarcts are identified.  No ventriculomegaly. No midline shift, mass effect, or evidence of intracranial mass lesion. Negative pituitary, cervicomedullary junction visualized cervical spine. Visible internal auditory structures appear normal. Mastoids are clear.  Small left sphenoid sinus mucous retention cyst. Minor  ethmoid sinus mucosal thickening. Visualized orbit soft tissues are within normal limits. Normal bone marrow signal. Visualized scalp soft tissues are within normal limits.  In the left parotid space there is a 17 mm nodule with subtle dark T2 signal (series 11, image 14, and mildly restricted diffusion (series 6, image 15).  MRA HEAD FINDINGS   Antegrade flow in the posterior circulation with dominant distal left vertebral artery. Normal PICA origins. Normal vertebrobasilar junction. No basilar stenosis. SCA and PCA origins are normal. Bilateral PCA branches are normal. Posterior communicating arteries are diminutive or absent.  Antegrade flow in the right ICA siphon which is irregular and appears up to moderately stenosed in the cavernous segment (series 3, image 72). There is also a small laterally directed cavernous aneurysm or ulcerated plaque (series 3, image 72 long arrow). The supra clinoid right ICA is patent. The right ICA terminus is normally patent.  Decreased antegrade flow in the left ICA siphon from the distal cervical levels. Moderate to severe irregularity in the left cavernous segment might be the source of hemodynamically significant stenosis (series 305, image 5). Nonetheless, the left supra clinoid ICA and terminus remain patent.  Normal right MCA and ACA origins and branches. Anterior communicating artery is normal.  On the left there is decreased flow signal throughout the left MCA branches, but no major branch occlusion or focal stenosis identified. Left ACA flow signal appears to be more symmetric.  IMPRESSION: 1. Scattered and patchy acute left MCA infarcts. No associated mass effect or hemorrhage. 2. MRA appearance indicating hemodynamically significant stenosis of the left ICA. The causative lesion might be in the left cavernous segment, or alternatively could be upstream in the left neck. 3. Subsequently decreased flow signal at the left ICA terminus and in the left MCA branches, but no major circle of Willis branch occlusion. 4. There is also extensive atherosclerosis of the right ICA cavernous segment with at least moderate stenosis on that side. 5. Unexpected finding of 17 mm left parotid space nodule with suspicious MRI characteristics. 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 Recommend nonurgent ENT followup once  the patient recovers from the acute events.  Study discussed by telephone with Dr. Shanon Brow Tat On 11/02/2013 at 10:58 .   Electronically Signed   By: Lars Pinks M.D.   On: 11/02/2013 11:10   Mr Jodene Nam Head/brain Wo Cm  11/02/2013   CLINICAL DATA:  63 year old female with hypertension, diabetes, visual disturbances on the left with right side weakness. Right upper extremity weakness. Initial encounter.  EXAM: MRI HEAD WITHOUT CONTRAST  MRA HEAD WITHOUT CONTRAST  TECHNIQUE: Multiplanar, multiecho pulse sequences of the brain and surrounding structures were obtained without intravenous contrast. Angiographic images of the head were obtained using MRA technique without contrast.  COMPARISON:  Head CT without contrast 10/1913.  FINDINGS: MRI HEAD FINDINGS  Scattered mostly subcentimeter areas of restricted diffusion in the left MCA territory. Lesions involving from the left superior temporal gyrus (series 5, image 14), to the left periatrial white matter, to the sensory cortex and motor strip subcortical white matter (more confluent, up to 12 mm series 5, image 25). Associated mild T2 and FLAIR hyperintensity. No associated mass effect. No evidence of associated hemorrhage.  No right hemisphere or posterior fossa restricted diffusion. Major intracranial vascular flow voids are preserved. Outside of the affected areas, gray and white matter signal is within normal limits. No chronic infarcts are identified.  No ventriculomegaly. No midline shift, mass effect, or evidence of intracranial  mass lesion. Negative pituitary, cervicomedullary junction visualized cervical spine. Visible internal auditory structures appear normal. Mastoids are clear.  Small left sphenoid sinus mucous retention cyst. Minor ethmoid sinus mucosal thickening. Visualized orbit soft tissues are within normal limits. Normal bone marrow signal. Visualized scalp soft tissues are within normal limits.  In the left parotid space there is a 17 mm nodule with  subtle dark T2 signal (series 11, image 14, and mildly restricted diffusion (series 6, image 15).  MRA HEAD FINDINGS  Antegrade flow in the posterior circulation with dominant distal left vertebral artery. Normal PICA origins. Normal vertebrobasilar junction. No basilar stenosis. SCA and PCA origins are normal. Bilateral PCA branches are normal. Posterior communicating arteries are diminutive or absent.  Antegrade flow in the right ICA siphon which is irregular and appears up to moderately stenosed in the cavernous segment (series 3, image 72). There is also a small laterally directed cavernous aneurysm or ulcerated plaque (series 3, image 72 long arrow). The supra clinoid right ICA is patent. The right ICA terminus is normally patent.  Decreased antegrade flow in the left ICA siphon from the distal cervical levels. Moderate to severe irregularity in the left cavernous segment might be the source of hemodynamically significant stenosis (series 305, image 5). Nonetheless, the left supra clinoid ICA and terminus remain patent.  Normal right MCA and ACA origins and branches. Anterior communicating artery is normal.  On the left there is decreased flow signal throughout the left MCA branches, but no major branch occlusion or focal stenosis identified. Left ACA flow signal appears to be more symmetric.  IMPRESSION: 1. Scattered and patchy acute left MCA infarcts. No associated mass effect or hemorrhage. 2. MRA appearance indicating hemodynamically significant stenosis of the left ICA. The causative lesion might be in the left cavernous segment, or alternatively could be upstream in the left neck. 3. Subsequently decreased flow signal at the left ICA terminus and in the left MCA branches, but no major circle of Willis branch occlusion. 4. There is also extensive atherosclerosis of the right ICA cavernous segment with at least moderate stenosis on that side. 5. Unexpected finding of 17 mm left parotid space nodule with  suspicious MRI characteristics. 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 Recommend nonurgent ENT followup once the patient recovers from the acute events.  Study discussed by telephone with Dr. Shanon Brow Tat On 11/02/2013 at 10:58 .   Electronically Signed   By: Lars Pinks M.D.   On: 11/02/2013 11:10         Subjective: Patient denies fevers, chills, chest pain, shortness breath, nausea, vomiting, diarrhea, abdominal pain.  Objective: Filed Vitals:   11/05/13 0018 11/05/13 0405 11/05/13 0733 11/05/13 1140  BP: 110/39 125/42 130/42   Pulse: 91 82 79   Temp: 98.6 F (37 C) 98.9 F (37.2 C) 97.9 F (36.6 C) 99.1 F (37.3 C)  TempSrc: Oral Oral Oral Oral  Resp: 12 10 13    Height:      Weight:      SpO2: 91% 95% 91%     Intake/Output Summary (Last 24 hours) at 11/05/13 1229 Last data filed at 11/05/13 1000  Gross per 24 hour  Intake   2175 ml  Output    550 ml  Net   1625 ml   Weight change:  Exam:   General:  Nontoxic appearing, in no distress  HEENT: No icterus, No thrush,[place]/AT; left neck incision well approximated without erythema or drainage.  Cardiovascular: NSR, no JVD or  peripheral edema S1/S2, no rubs, no gallops  Respiratory: CTA bilaterally, no wheezing, no crackles, no rhonchi  Abdomen: Soft/+BS, non tender, non distended, no guarding  Extremities: No lymphangitis, No petechiae, No rashes, no synovitis  Data Reviewed: Basic Metabolic Panel:  Recent Labs Lab 11/01/13 0930 11/01/13 2127 11/04/13 0515 11/04/13 2153 11/05/13 0244  NA 143  --  142  --  143  K 3.4*  --  4.0  --  3.7  CL 101  --  106  --  108  CO2 25  --  22  --  22  GLUCOSE 127*  --  104*  --  84  BUN 9  --  12  --  12  CREATININE 0.90 0.88 0.80 0.85 0.88  CALCIUM 10.3  --  9.5  --  8.6   Liver Function Tests:  Recent Labs Lab 11/01/13 0930  AST 14  ALT 15  ALKPHOS 162*  BILITOT <0.2*  PROT 8.2  ALBUMIN 4.3   No results found for this basename: LIPASE, AMYLASE,  in  the last 168 hours No results found for this basename: AMMONIA,  in the last 168 hours CBC:  Recent Labs Lab 11/01/13 0930 11/01/13 2127 11/04/13 2153 11/05/13 0244  WBC 12.6* 11.8* 14.0* 12.9*  NEUTROABS 7.8*  --   --   --   HGB 13.6 11.8* 10.3* 10.4*  HCT 41.1 36.1 31.1* 31.9*  MCV 81.7 81.5 82.1 82.9  PLT 213 186 179 174   Cardiac Enzymes:  Recent Labs Lab 11/01/13 0930  TROPONINI <0.30   BNP: No components found with this basename: POCBNP,  CBG:  Recent Labs Lab 11/04/13 1216 11/04/13 1619 11/04/13 2126 11/05/13 0735 11/05/13 1203  GLUCAP 161* 170* 88 91 135*    Recent Results (from the past 240 hour(s))  SURGICAL PCR SCREEN     Status: None   Collection Time    11/04/13  6:10 AM      Result Value Ref Range Status   MRSA, PCR NEGATIVE  NEGATIVE Final   Staphylococcus aureus NEGATIVE  NEGATIVE Final   Comment:            The Xpert SA Assay (FDA     approved for NASAL specimens     in patients over 85 years of age),     is one component of     a comprehensive surveillance     program.  Test performance has     been validated by [employer] for patients greater     than or equal to 12 year old.     It is not intended     to diagnose infection nor to     guide or monitor treatment.     Scheduled Meds: . aspirin  300 mg Rectal Daily   Or  . aspirin  325 mg Oral Daily  . atorvastatin  40 mg Oral q1800  . buPROPion  300 mg Oral Daily  . docusate sodium  100 mg Oral Daily  . enoxaparin (LOVENOX) injection  40 mg Subcutaneous Q24H  . insulin aspart  0-15 Units Subcutaneous TID WC  . insulin aspart  0-5 Units Subcutaneous QHS  . insulin aspart  4 Units Subcutaneous TID WC  . insulin glargine  44 Units Subcutaneous QHS  . levothyroxine  150 mcg Oral QAC breakfast  . nicotine  14 mg Transdermal Daily  . pantoprazole  40 mg Oral Daily   Continuous Infusions: . sodium chloride  75 mL/hr at 11/05/13 0906     Levander Katzenstein L., ANP  [facility] Pager [phone]  If 7PM-7AM, please contact night-coverage [email or web address] Password [facility] 11/05/2013, 12:29 PM   LOS: 4 days

## 2013-11-05 NOTE — Progress Notes (Signed)
Utilization review completed.  

## 2013-11-05 NOTE — Progress Notes (Signed)
PT Note  Per discussion with Ginny Forth that saw the patient for a consult today she has not had a change in functional status since a PT consult was completed on Friday March 20.  Therefore no need to re-evaluate the patient for PT today. The OT discussed this with the patient who was in agreement.  PT is signing off. Thanks    [place], [place]  [phone] 11/05/2013

## 2013-11-05 NOTE — Progress Notes (Signed)
Patient seen, examined and discussed with my nurse practitioner. Agree with her note. Stable. Doing well. Transfer to floor. Still very weak, so have asked physical therapy for followup evaluation post surgery and possible repeat evaluation by inpatient rehabilitation. On aspirin 325 mg.

## 2013-11-05 NOTE — Progress Notes (Signed)
Plan remains the recommendations of discharge to home with home health therapies. Patient is progressing nicely. Will hold on formal rehabilitation consult at this time

## 2013-11-05 NOTE — Care Management Note (Signed)
    Page 1 of 2   11/07/2013     10:29:24 AM   CARE MANAGEMENT NOTE 11/07/2013  Patient:  Sonia Skinner, Sonia Skinner   Account Number:  [id number]  Date Initiated:  11/05/2013  Documentation initiated by:  Marvetta Gibbons  Subjective/Objective Assessment:   Pt admitted with right sided weakness/ CVA-  s/p CEA on 3/22     Action/Plan:   PTA pt lived at home with sister- PT/OT evals ordered - NCM to follow for recommendations   Anticipated DC Date:  11/07/2013   Anticipated DC Plan:  [place]  CM consult  OP Neuro Rehab      Choice offered to / List presented to:             Status of service:  In process, will continue to follow Medicare Important Message given?   (If response is "NO", the following Medicare IM given date fields will be blank) Date Medicare IM given:   Date Additional Medicare IM given:    Discharge Disposition:    Per UR Regulation:  Reviewed for med. necessity/level of care/duration of stay  If discussed at [place] of Stay Meetings, dates discussed:   11/06/2013    Comments:  11/07/13- 1030- Marvetta Gibbons RN, BSN [phone] Referral received for outpt OT at neuro rehab- referral placed in epic to [facility]- pt given info on address and phone number- pt to see how she feels over next couple weeks and decide if she still wants or needs based on her progress with her RUE- pt states she is on a limited income and realizes she will have to pay out of pocket for these services. (per orange card copay would be $30) - call made to [facility] with [facility] - who states that she had attempted to speak with pt on 3/20 but pt did not feel like speaking with her- Suanne Marker will attempt to call pt again today prior to discharge. Also called and made pt an appointment with [facility] for f/u on April 10 at 12noon- pt informed and info placed on AVS. 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  (call was made to the [facility] to verify that the [facility] applies- which it does at whatever is on the card as far as % or copay that pt has to pay-info is found on bottom left of orange card)-- pt and family report that they do not need any DME - have equipment needed already at home. MD will need referral to [facility] re[facility] if pt returns home for outpt OT

## 2013-11-05 NOTE — Progress Notes (Signed)
Physical medicine and rehabilitation consult requested. Patient status post left carotid surgery identified workup of CVA. Most recent physical therapy evaluation 11/02/2013 with recommendations of home health therapies ambulating 300 feet independently without device. Needs followup therapy evaluation after carotid surgery and will follow up if needed with formal rehabilitation consult at that time.

## 2013-11-05 NOTE — Progress Notes (Signed)
Occupational Therapy Treatment and Discharge Patient Details Name: Sonia Skinner MRN: [id number] DOB: March 22, 1951 Today's Date: 11/05/2013 Time: 1250-1350 OT Time Calculation (min): 60 min  OT Assessment / Plan / Recommendation  History of present illness 63 y/o female With past medical history of hypertension diabetes ongoing smoking that comes in visual disturbances on the left on with right-sided weakness this started the day prior to admission. 11/04/13 s/p left carotid endarterectomy with bovine pericardial patch angioplasty   OT comments  This 63 yo female is doing great. She still is having some issues with RUE as far as strength (drift with shoulder flexion and hold) and coordination (translation of objects in hand). She can move about her room without issues (except for IV pole and having to maneuver it around multiple objects in room to get to bathroom). No balance deficits noted do not feel she needs a PT re-eval. I have told her family that once she moves to 4N they can walk with her around the unit as long as the nurse ok's it--pt is Mod I (increased time to complete tasks due to generalized weakness. No further acute OT needs, follow up OPOT per pt's desires as to how she feels her RUE is progressing.  Follow Up Recommendations  Outpatient OT (In 4 weeks,, if she does not feel her RUE is not progressing like she wants it to.)       Equipment Recommendations  None recommended by OT    Recommendations for Other Services  (eye doctor if vision doesn't completely clear up in next 2-4 weeks)  Frequency Min 2X/week   Progress towards OT Goals Progress towards OT goals: Goals met/education completed, patient discharged from [place] Discharge plan remains appropriate    Precautions / Restrictions Precautions Precaution Comments: Per sister in room surgeon had said that he did not want pt to be doing much of anything that is strenuous  Restrictions Weight Bearing Restrictions: No    Pertinent Vitals/Pain Spasms in RUE the more she worked with it (fatigue);Neck pain at incision site; RN made aware    ADL  Toilet Transfer: Independent Toilet Transfer Method: Sit to stand Toilet Transfer Equipment: Regular height toilet Toileting - Clothing Manipulation and Hygiene: Independent Where Assessed - [place] and Hygiene: Sit to stand from 3-in-1 or toilet Equipment Used:  (None) Transfers/Ambulation Related to ADLs: independent ADL Comments: Pt reports that her blurry vision in her left eye is getting better, she did not show any issues functionally with vision during session.      OT Goals(current goals can now be found in the care plan section)    Visit Information  Last OT Received On: 11/05/13 Assistance Needed: +1 History of Present Illness: 63 y/o female With past medical history of hypertension diabetes ongoing smoking that comes in visual disturbances on the left on with right-sided weakness this started the day prior to admission. 11/04/13 s/p left carotid endarterectomy with bovine pericardial patch angioplasty          Cognition  Cognition Arousal/Alertness: Awake/alert Behavior During Therapy: [facility] for tasks assessed/performed Overall Cognitive Status: Within Functional Limits for tasks assessed    Mobility  Bed Mobility Overal bed mobility: Independent Transfers Overall transfer level: Independent    Exercises  Other Exercises Other Exercises: Gave pt FM handout and went over with her the activities she needs to be doing right now. I also gave her some activities she can do once her surgeon releases her to do more normal  activites (then she can work more on her shoulder strengthening.   Balance Balance Overall balance assessment: Independent  End of Session OT - End of Session Equipment Utilized During Treatment:  (None) Activity Tolerance: Patient tolerated treatment well Patient left:  (sitting EOB eating lunch) Nurse  Communication: Patient requests pain meds       Almon Register [phone] 11/05/2013, 2:10 PM

## 2013-11-06 ENCOUNTER — Encounter (HOSPITAL_COMMUNITY): Payer: Self-pay | Admitting: Vascular Surgery

## 2013-11-06 ENCOUNTER — Telehealth: Payer: Self-pay | Admitting: Vascular Surgery

## 2013-11-06 LAB — GLUCOSE, CAPILLARY
Glucose-Capillary: 116 mg/dL — ABNORMAL HIGH (ref 70–99)
Glucose-Capillary: 123 mg/dL — ABNORMAL HIGH (ref 70–99)

## 2013-11-06 MED ORDER — DSS 100 MG PO CAPS: 100.0000 mg | ORAL_CAPSULE | Freq: Every day | ORAL | Status: DC

## 2013-11-06 MED ORDER — HYDROCODONE-ACETAMINOPHEN 5-325 MG PO TABS
2.0000 | ORAL_TABLET | Freq: Once | ORAL | Status: AC
  Administered 2013-11-06: 2 via ORAL

## 2013-11-06 MED ORDER — ATORVASTATIN CALCIUM 40 MG PO TABS: 40.0000 mg | ORAL_TABLET | Freq: Every day | ORAL | Status: DC

## 2013-11-06 MED ORDER — ASPIRIN 325 MG PO TABS: 325.0000 mg | ORAL_TABLET | Freq: Every day | ORAL | Status: DC

## 2013-11-06 MED ORDER — HYDROCODONE-ACETAMINOPHEN 5-325 MG PO TABS: 1.0000 | ORAL_TABLET | Freq: Four times a day (QID) | ORAL | Status: DC | PRN

## 2013-11-06 MED ORDER — NICOTINE 14 MG/24HR TD PT24: 14.0000 mg | MEDICATED_PATCH | Freq: Every day | TRANSDERMAL | Status: DC

## 2013-11-06 MED ORDER — HYDROCODONE-ACETAMINOPHEN 5-325 MG PO TABS
1.0000 | ORAL_TABLET | Freq: Four times a day (QID) | ORAL | Status: DC | PRN
  Administered 2013-11-06 (×2): 1 via ORAL
  Filled 2013-11-06 (×2): qty 1

## 2013-11-06 NOTE — Discharge Summary (Signed)
Physician Discharge Summary  Sonia Skinner [id number] DOB: 11-29-1950 DOA: 11/01/2013  PCP: Angelica Chessman, MD  Admit date: 11/01/2013 Discharge date: 11/06/2013  Time spent: 30 minutes  Recommendations for Outpatient Follow-up:  1. Follow up with Dr. Constance Holster / ENT March 30 th re: parotid nodule 2. Follow up with Dr. Scot Dock / Vascular surgeon- office will call with appointment 3. Call to arrange appointment with [facility] for 1-2 weeks after discharge 4. Return to work after seen by Vascular Surgeon after discharge-interim out of work note provided 5. If right arm weakness/spasms continue 4 weeks after discharge you will need to begin outpatient OT- case manager sent referral prior to discharge 6. Follow up with Dr. Leonie Man / Neurology in 2 months.  Discharge Diagnoses:  Active Problems:   Essential hypertension, benign-controlled   Type II or unspecified type diabetes mellitus-controlled   Parotid mass/nodule 17 mm- FU appointment with ENT arranged   Acute ischemic left MCA stroke/new diagnosis carotid stenosis- post left CEA   Tobacco abuse   Discharge Condition: stable  Diet recommendation: Carbohydrate modified/Heart Healthy  Filed Weights   11/01/13 0924  Weight: 146 lb (66.225 kg)    History of present illness:  63 year old female patient with history of hypertension diabetes and ongoing tobacco abuse. Presented to the emergency department on 11/01/2013 with complaints of visual disturbance involving the left as well as right sided weakness that started 24 hours prior to admission. She endorsed to the admitting physician that she did not pay much attention to the symptoms but after awakening on the morning of presentation with persistent symptoms and noticing worsening perception issues in the left thigh and increased right arm clumsiness she decided to present to the emergency department. She denied any difficulties with slurred speech,  coughing with eating or drinking.  In the ER her laboratory data was unremarkable except for white count of 12,600. Urinalysis was unremarkable. Noncontrasted CT scan of the head obtained in the ER showed an area of low attenuation in the gray-white junction in the left frontal lobe which could represent a subacute infarct. Her EKG demonstrated normal sinus rhythm.  Hospital Course:  Acute left MCA infarct  -MRI brain showed patchy acute left MCA infarct  -MRI of brain suggests possible left ICA stenosis in the cavernous segment versus left neck  -carotid u/s-->80% L-carotid stenosis  -echocardiogram--EF60-65%, no WMA, grade 1 diastolic dysfx; no embolic source  -LDL [id number] -statin added this admission -PT evaluated- no OP needs. OT recommended if RUE spasms and weakness persisted 4 weeks after discharge then pursue OP follow up. -Neurology followed-OP follow up in 2 months -continue ASA 325mg    Left Carotid Stenosis  -CTA of neck showed "at least" 80% stenosis of L-ICA with possible tailing thrombus  -Dr. Scot Dock with [facility] consulted-s/p L-CEA 11/05/13  -routine post op care and follow up- office will call with appointment -OOW until seen after discharge by Dr. Scot Dock  Anorexia/dehydration  -poor intake 2/2 nausea  -resolved prior to discharge  Left parotid nodule  -Appears suspicious on MRI-17 mm incidental finding  -Discussed with ENT, Dr. Constance Holster  -followup appt made for 11/12/13@130PM    Hypertension  -held lisinopril and amlodipine - BP was soft post op -will resume Lisinopril/HCTZ upon dc but hold amlodipine for now   Diabetes mellitus type 2  -continue Lantus 44 units -CBGs controlled  -Patient had mild episode of hypoglycemia on 11/01/2013 which may have been partly due to the patient's glipizide -will  resume upon discharge given elevated HgbA1c  -SSI and meal coverage utilized during the hospitalization --Hemoglobin A1c 10.3 -will need tighter control of diabetes as  outpatient  Tobacco abuse  -Tobacco cessation discussed  -continue Nicoderm patch if patient can obtain- script given  Hypothyroidism  -Continue Synthroid   hyperlipidemia  -Start Lipitor (use generic)    Procedures: Transthoracic Echocardiography - Left ventricle: The cavity size was normal. Wall thickness was normal. Systolic function was normal. The estimated ejection fraction was in the range of 60% to 65%. Wall motion was normal; there were no regional wall motion abnormalities. Doppler parameters are consistent with abnormal left ventricular relaxation (grade 1 diastolic dysfunction). - Aortic valve: There was no stenosis. - Mitral valve: No significant regurgitation. - Right ventricle: The cavity size was normal. Systolic function was normal. - Pulmonary arteries: No complete TR doppler jet so unable to estimate PA systolic pressure. - Inferior vena cava: The vessel was normal in size; the respirophasic diameter changes were in the normal range (= 50%); findings are consistent with normal central venous pressure. - Pericardium, extracardiac: A trivial pericardial effusion was identified posterior to the heart.  Carotid Duplex Study Right: 1-39% ICA stenosis. ICA/CCA ratio is 1.12. Moderate ECA stenosis. Left: >80% ICA stenosis. ICA/CCA ratio is 14.03. Severe ECA stenosis. Bilateral: Vertebral artery flow is antegrade.  Left carotid endarterectomy with bovine pericardial patch angioplasty  3/22 Dr. Scot Dock   Consultations:  Dr. Stewart/Neurology  Dr. Dickson/Vascular Surgery  Discharge Exam: Filed Vitals:   11/06/13 0744  BP:   Pulse:   Temp: 98 F (36.7 C)  Resp:    General: Nontoxic appearing, in no distress HEENT: No icterus, No thrush,[place]/AT; left neck incision well approximated without erythema or drainage. Cardiovascular: NSR, no JVD or peripheral edema S1/S2, no rubs, no gallops Respiratory: CTA bilaterally, no wheezing, no crackles, no rhonchi  Abdomen: Soft/+BS, non tender, non distended, no guarding Extremities: No lymphangitis, No petechiae, No rashes, no synovitis    Discharge Instructions      Discharge Orders   Future Orders Complete By Expires   Call MD for:  extreme fatigue  As directed    Call MD for:  persistant dizziness or light-headedness  As directed    Call MD for:  redness, tenderness, or signs of infection (pain, swelling, redness, odor or green/yellow discharge around incision site)  As directed    Call MD for:  temperature >100.4  As directed    Diet - low sodium heart healthy  As directed    Diet Carb Modified  As directed    Driving Restrictions  As directed    Comments:     One week   Increase activity slowly  As directed    Lifting restrictions  As directed    Comments:     No lifting more than 10 lbs for one week   No wound care  As directed    Other Restrictions  As directed    Comments:     Out of work until seen after discharge by Dr.Dickson-their office will call with an appointment date and time       Medication List    STOP taking these medications       amLODipine 10 MG tablet  Commonly known as:  NORVASC     amoxicillin-clavulanate 875-125 MG per tablet  Commonly known as:  AUGMENTIN     ibuprofen 800 MG tablet  Commonly known as:  ADVIL,MOTRIN      TAKE these  medications       acetaminophen 325 MG tablet  Commonly known as:  TYLENOL  Take 650 mg by mouth every 6 (six) hours as needed for headache.     aspirin 325 MG tablet  Take 1 tablet (325 mg total) by mouth daily.     atorvastatin 40 MG tablet  Commonly known as:  LIPITOR  Take 1 tablet (40 mg total) by mouth daily at 6 PM.     buPROPion 150 MG 24 hr tablet  Commonly known as:  WELLBUTRIN XL  2 tabs in AM and 1 in PM     carbamide peroxide 6.5 % otic solution  Commonly known as:  DEBROX  Place 5 drops into the left ear 2 (two) times daily.     DSS 100 MG Caps  Take 100 mg by mouth daily.     glipiZIDE  10 MG tablet  Commonly known as:  GLUCOTROL  Take 2 tablets (20 mg total) by mouth 2 (two) times daily before a meal.     HYDROcodone-acetaminophen 5-325 MG per tablet  Commonly known as:  NORCO/VICODIN  Take 1-2 tablets by mouth every 6 (six) hours as needed for moderate pain.     insulin glargine 100 UNIT/ML injection  Commonly known as:  LANTUS  Inject 0.8 mLs (80 Units total) into the skin at bedtime.     levothyroxine 150 MCG tablet  Commonly known as:  SYNTHROID, LEVOTHROID  Take 1 tablet (150 mcg total) by mouth daily.     lisinopril-hydrochlorothiazide 20-25 MG per tablet  Commonly known as:  PRINZIDE,ZESTORETIC  Take 1 tablet by mouth daily.     nicotine 14 mg/24hr patch  Commonly known as:  NICODERM CQ - dosed in mg/24 hours  Place 1 patch (14 mg total) onto the skin daily.     omeprazole 40 MG capsule  Commonly known as:  PRILOSEC  TAKE 1 CAPSULE BY MOUTH ONCE DAILY     traMADol 50 MG tablet  Commonly known as:  ULTRAM  1-2 tabs every 8 hours as needed pain       Allergies  Allergen Reactions  . Anesthetics, Ester Anaphylaxis   Follow-up Information   Follow up with Izora Gala, MD. (11/12/13@130PM )    Specialty:  Otolaryngology   Contact information:   [address] [place] [address] [phone]       Follow up with Forbes Cellar, MD. Schedule an appointment as soon as possible for a visit in 2 months.   Specialties:  Neurology, Radiology   Contact information:   [place] [address] [phone]       Follow up with DICKSON,CHRISTOPHER S, MD In 2 weeks. (Office will call you to arrange your appt (sent))    Specialty:  Vascular Surgery   Contact information:   [address] [place] [address] [phone]       Follow up with [facility]    . (kEEP PREVIOUS APPOINTMENT OR SCHEDULE TO BE SEEN IN 2 WEEKS POST [place])    Contact information:    [place] [address] [phone]       The results of significant diagnostics from this hospitalization (including imaging, microbiology, ancillary and laboratory) are listed below for reference.    Significant Diagnostic Studies: Dg Chest 2 View  11/01/2013   CLINICAL DATA:  Stroke.  Smoker.  EXAM: CHEST  2 VIEW  COMPARISON:  None.  FINDINGS: Lungs are clear. Heart size is normal. No pneumothorax or pleural effusion.  IMPRESSION: Negative chest.   Electronically Signed   By: Inge Rise M.D.   On: 11/01/2013 21:33   Ct Head Wo Contrast  11/01/2013   CLINICAL DATA:  Right-sided weakness for 2 days  EXAM: CT HEAD WITHOUT CONTRAST  TECHNIQUE: Contiguous axial images were obtained from the base of the skull through the vertex without intravenous contrast.  COMPARISON:  None.  FINDINGS: There is no evidence of mass effect, midline shift or extra-axial fluid collections. There is no evidence of a space-occupying lesion or intracranial hemorrhage. There is an area of low-attenuation at the gray-white junction in the left frontal lobe superiorly (image 22) which may reflect volume averaging versus a subacute infarct. There are no other areas of concerning for cortical infarction.  The ventricles and sulci are appropriate for the patient's age. The basal cisterns are patent.  Visualized portions of the orbits are unremarkable. There is mild sphenoid sinus mucosal thickening.  The osseous structures are unremarkable.  IMPRESSION: There is an area of low-attenuation at the gray-white junction in the left frontal lobe superiorly (image 22) which may reflect volume averaging versus a subacute infarct.   Electronically Signed   By: Kathreen Devoid   On: 11/01/2013 10:10   Ct Angio Neck W/cm &/or Wo/cm  11/03/2013   CLINICAL DATA:  Left MCA region infarctions. History of carotid stenoses.  EXAM: CT ANGIOGRAPHY NECK  TECHNIQUE: Multidetector CT imaging of the neck was performed using  the standard protocol during bolus administration of intravenous contrast. Multiplanar CT image reconstructions and MIPs were obtained to evaluate the vascular anatomy. Carotid stenosis measurements (when applicable) are obtained utilizing NASCET criteria, using the distal internal carotid diameter as the denominator.  CONTRAST:  12mL OMNIPAQUE IOHEXOL 350 MG/ML SOLN  COMPARISON:  MRI 11/02/2013  FINDINGS: No focal lung lesions. No superior mediastinal mass. There is atherosclerosis of the aorta. Branching pattern of the brachiocephalic vessels from the arch is normal. Atherosclerotic disease at the left subclavian origin results in narrowing of 30%. No stenosis of the innominate or left common carotid artery origin.  Right common carotid artery is widely patent to the bifurcation region. There is atherosclerotic plaque at the carotid bifurcation but the minimal diameter of the ICA is 4 mm. Compared to a more distal cervical ICA diameter of 4 mm, there is no stenosis.  The left common carotid artery is widely patent to its distal extent. There is soft plaque at the distal common carotid artery with narrowing of the lumen from its expected diameter of 5 mm to a diameter of 2.5 mm just proximal to the bifurcation. This indicates a 50% stenosis. There is extensive primarily soft plaque affecting the proximal internal carotid artery with narrowing of the lumen to 1 mm or less. There may be an intraluminal filling defect extending from this region. The more distal cervical ICA is narrowed because of reduced inflow. The stenosis at the proximal ICA is at least 80% and possibly a severe is 90%. The left ICA does show flow through the skullbase. There is stenosis of the left external carotid artery as well.  Both vertebral artery origins are patent. Left vertebral artery is dominant. There is mild narrowing of both proximal vertebral arteries, but stenosis is no more than 30%. No stenosis in the cervical region. Both  vessels show flow to the basilar.  Review of the MIP images confirms the above findings.  IMPRESSION: Non stenotic atherosclerotic plaque at the carotid bifurcation on the right.  Severe atherosclerotic disease at the carotid bifurcation region on the left. 50% stenosis of the common carotid artery just proximal to the bifurcation. Severe narrowing and irregularity of the internal carotid artery bulb with stenosis at least 80% and possibly greater. There may be some thrombus tailing from this region.  These results were called by telephone at the time of interpretation on 11/03/2013 at 4:32 PM to Dr. Shanon Brow TAT , who verbally acknowledged these results.   Electronically Signed   By: Nelson Chimes M.D.   On: 11/03/2013 16:32   Mr Brain Wo Contrast  11/02/2013   CLINICAL DATA:  63 year old female with hypertension, diabetes, visual disturbances on the left with right side weakness. Right upper extremity weakness. Initial encounter.  EXAM: MRI HEAD WITHOUT CONTRAST  MRA HEAD WITHOUT CONTRAST  TECHNIQUE: Multiplanar, multiecho pulse sequences of the brain and surrounding structures were obtained without intravenous contrast. Angiographic images of the head were obtained using MRA technique without contrast.  COMPARISON:  Head CT without contrast 10/1913.  FINDINGS: MRI HEAD FINDINGS  Scattered mostly subcentimeter areas of restricted diffusion in the left MCA territory. Lesions involving from the left superior temporal gyrus (series 5, image 14), to the left periatrial white matter, to the sensory cortex and motor strip subcortical white matter (more confluent, up to 12 mm series 5, image 25). Associated mild T2 and FLAIR hyperintensity. No associated mass effect. No evidence of associated hemorrhage.  No right hemisphere or posterior fossa restricted diffusion. Major intracranial vascular flow voids are preserved. Outside of the affected areas, gray and white matter signal is within normal limits. No chronic infarcts  are identified.  No ventriculomegaly. No midline shift, mass effect, or evidence of intracranial mass lesion. Negative pituitary, cervicomedullary junction visualized cervical spine. Visible internal auditory structures appear normal. Mastoids are clear.  Small left sphenoid sinus mucous retention cyst. Minor ethmoid sinus mucosal thickening. Visualized orbit soft tissues are within normal limits. Normal bone marrow signal. Visualized scalp soft tissues are within normal limits.  In the left parotid space there is a 17 mm nodule with subtle dark T2 signal (series 11, image 14, and mildly restricted diffusion (series 6, image 15).  MRA HEAD FINDINGS  Antegrade flow in the posterior circulation with dominant distal left vertebral artery. Normal PICA origins. Normal vertebrobasilar junction. No basilar stenosis. SCA and PCA origins are normal. Bilateral PCA branches are normal. Posterior communicating arteries are diminutive or absent.  Antegrade flow in the right ICA siphon which is irregular and appears up to moderately stenosed in the cavernous segment (series 3, image 72). There is also a small laterally directed cavernous aneurysm or ulcerated plaque (series 3, image 72 long arrow). The supra clinoid right ICA is patent. The right ICA terminus is normally patent.  Decreased antegrade flow in the left ICA siphon from the distal cervical levels. Moderate to severe irregularity in the left cavernous segment might be the source of hemodynamically significant stenosis (series 305, image 5). Nonetheless, the left supra clinoid ICA and terminus remain patent.  Normal right MCA and ACA origins and branches. Anterior communicating artery is normal.  On the left there is decreased flow signal throughout the left MCA branches, but no major branch occlusion or focal stenosis identified. Left ACA flow signal appears to be more symmetric.  IMPRESSION: 1. Scattered and patchy acute left MCA infarcts. No associated mass effect or  hemorrhage. 2. MRA appearance indicating hemodynamically  significant stenosis of the left ICA. The causative lesion might be in the left cavernous segment, or alternatively could be upstream in the left neck. 3. Subsequently decreased flow signal at the left ICA terminus and in the left MCA branches, but no major circle of Willis branch occlusion. 4. There is also extensive atherosclerosis of the right ICA cavernous segment with at least moderate stenosis on that side. 5. Unexpected finding of 17 mm left parotid space nodule with suspicious MRI characteristics. 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 Recommend nonurgent ENT followup once the patient recovers from the acute events.  Study discussed by telephone with Dr. Shanon Brow Tat On 11/02/2013 at 10:58 .   Electronically Signed   By: Lars Pinks M.D.   On: 11/02/2013 11:10   Mr Jodene Nam Head/brain Wo Cm  11/02/2013   CLINICAL DATA:  63 year old female with hypertension, diabetes, visual disturbances on the left with right side weakness. Right upper extremity weakness. Initial encounter.  EXAM: MRI HEAD WITHOUT CONTRAST  MRA HEAD WITHOUT CONTRAST  TECHNIQUE: Multiplanar, multiecho pulse sequences of the brain and surrounding structures were obtained without intravenous contrast. Angiographic images of the head were obtained using MRA technique without contrast.  COMPARISON:  Head CT without contrast 10/1913.  FINDINGS: MRI HEAD FINDINGS  Scattered mostly subcentimeter areas of restricted diffusion in the left MCA territory. Lesions involving from the left superior temporal gyrus (series 5, image 14), to the left periatrial white matter, to the sensory cortex and motor strip subcortical white matter (more confluent, up to 12 mm series 5, image 25). Associated mild T2 and FLAIR hyperintensity. No associated mass effect. No evidence of associated hemorrhage.  No right hemisphere or posterior fossa restricted diffusion. Major intracranial vascular flow voids are  preserved. Outside of the affected areas, gray and white matter signal is within normal limits. No chronic infarcts are identified.  No ventriculomegaly. No midline shift, mass effect, or evidence of intracranial mass lesion. Negative pituitary, cervicomedullary junction visualized cervical spine. Visible internal auditory structures appear normal. Mastoids are clear.  Small left sphenoid sinus mucous retention cyst. Minor ethmoid sinus mucosal thickening. Visualized orbit soft tissues are within normal limits. Normal bone marrow signal. Visualized scalp soft tissues are within normal limits.  In the left parotid space there is a 17 mm nodule with subtle dark T2 signal (series 11, image 14, and mildly restricted diffusion (series 6, image 15).  MRA HEAD FINDINGS  Antegrade flow in the posterior circulation with dominant distal left vertebral artery. Normal PICA origins. Normal vertebrobasilar junction. No basilar stenosis. SCA and PCA origins are normal. Bilateral PCA branches are normal. Posterior communicating arteries are diminutive or absent.  Antegrade flow in the right ICA siphon which is irregular and appears up to moderately stenosed in the cavernous segment (series 3, image 72). There is also a small laterally directed cavernous aneurysm or ulcerated plaque (series 3, image 72 long arrow). The supra clinoid right ICA is patent. The right ICA terminus is normally patent.  Decreased antegrade flow in the left ICA siphon from the distal cervical levels. Moderate to severe irregularity in the left cavernous segment might be the source of hemodynamically significant stenosis (series 305, image 5). Nonetheless, the left supra clinoid ICA and terminus remain patent.  Normal right MCA and ACA origins and branches. Anterior communicating artery is normal.  On the left there is decreased flow signal throughout the left MCA branches, but no major branch occlusion or focal stenosis identified. Left ACA flow signal  appears to be more symmetric.  IMPRESSION: 1. Scattered and patchy acute left MCA infarcts. No associated mass effect or hemorrhage. 2. MRA appearance indicating hemodynamically significant stenosis of the left ICA. The causative lesion might be in the left cavernous segment, or alternatively could be upstream in the left neck. 3. Subsequently decreased flow signal at the left ICA terminus and in the left MCA branches, but no major circle of Willis branch occlusion. 4. There is also extensive atherosclerosis of the right ICA cavernous segment with at least moderate stenosis on that side. 5. Unexpected finding of 17 mm left parotid space nodule with suspicious MRI characteristics. 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 Recommend nonurgent ENT followup once the patient recovers from the acute events.  Study discussed by telephone with Dr. Shanon Brow Tat On 11/02/2013 at 10:58 .   Electronically Signed   By: Lars Pinks M.D.   On: 11/02/2013 11:10    Microbiology: Recent Results (from the past 240 hour(s))  SURGICAL PCR SCREEN     Status: None   Collection Time    11/04/13  6:10 AM      Result Value Ref Range Status   MRSA, PCR NEGATIVE  NEGATIVE Final   Staphylococcus aureus NEGATIVE  NEGATIVE Final   Comment:            The Xpert SA Assay (FDA     approved for NASAL specimens     in patients over 81 years of age),     is one component of     a comprehensive surveillance     program.  Test performance has     been validated by [employer] for patients greater     than or equal to 34 year old.     It is not intended     to diagnose infection nor to     guide or monitor treatment.     Labs: Basic Metabolic Panel:  Recent Labs Lab 11/01/13 0930 11/01/13 2127 11/04/13 0515 11/04/13 2153 11/05/13 0244  NA 143  --  142  --  143  K 3.4*  --  4.0  --  3.7  CL 101  --  106  --  108  CO2 25  --  22  --  22  GLUCOSE 127*  --  104*  --  84  BUN 9  --  12  --  12  CREATININE 0.90  0.88 0.80 0.85 0.88  CALCIUM 10.3  --  9.5  --  8.6   Liver Function Tests:  Recent Labs Lab 11/01/13 0930  AST 14  ALT 15  ALKPHOS 162*  BILITOT <0.2*  PROT 8.2  ALBUMIN 4.3   No results found for this basename: LIPASE, AMYLASE,  in the last 168 hours No results found for this basename: AMMONIA,  in the last 168 hours CBC:  Recent Labs Lab 11/01/13 0930 11/01/13 2127 11/04/13 2153 11/05/13 0244  WBC 12.6* 11.8* 14.0* 12.9*  NEUTROABS 7.8*  --   --   --   HGB 13.6 11.8* 10.3* 10.4*  HCT 41.1 36.1 31.1* 31.9*  MCV 81.7 81.5 82.1 82.9  PLT 213 186 179 174   Cardiac Enzymes:  Recent Labs Lab 11/01/13 0930  TROPONINI <0.30   BNP: BNP (last 3 results) No results found for this basename: PROBNP,  in the last 8760 hours CBG:  Recent Labs Lab 11/05/13 0735 11/05/13 1203 11/05/13 1537 11/05/13 2148 11/06/13 0747  GLUCAP  91 135* 166* 161* 116*       Signed:  Benzion Mesta L. ANP [facility] 11/06/2013, 10:54 AM

## 2013-11-06 NOTE — Discharge Summary (Signed)
Patient seen, examined and discussed with my nurse practitioner. Agree with her note. Stable. Will need home physical therapy. Outpatient followup with ENT, vascular surgery and neurology. [facility]. Okay to discharge home.

## 2013-11-06 NOTE — Telephone Encounter (Addendum)
Message copied by Gena Fray on Tue Nov 06, 2013  3:47 PM ------      Message from: Peter Minium K      Created: Mon Nov 05, 2013  4:06 PM       I would do Vinnie Level with this one      ----- Message -----         From: Gena Fray         Sent: 11/05/2013   3:09 PM           To: Mena Goes, CMA            This is another one! Dr Scot Dock is out in 2 and 3 weeks. Should I bring her in to see Vinnie Level? Vacation time is really a pain :)       Thanks,      Hinton Dyer      ----- Message -----         From: Mena Goes, CMA         Sent: 11/05/2013   8:31 AM           To: [facility] Admin Pool                        ----- Message -----         From: Gabriel Earing, PA-C         Sent: 11/04/2013   9:34 AM           To: [facility] Charge Pool            S/p left CEA 11/04/13.  F/u with Dr. Scot Dock in 2 weeks.            Thanks,      Samantha             ------  11/06/13: lm for pt re appt, dpm

## 2013-11-06 NOTE — Progress Notes (Signed)
Pt to be discharged home per MD order. Provided discharge instructions and pt signed and veralized understanding. PIV was removed-tip intact. Surgical site intact no signs of drainage or infection.

## 2013-11-06 NOTE — Progress Notes (Signed)
[facility] Progress Note  11/06/2013 7:34 AM 2 Days Post-Op  Subjective:  States she is feeling better today and feeling a little stronger.  She has ambulated.  States her right arm is not back to baseline, but feels it is improving.  Tm 99.5 now 98.2 VSS 97% 2LO2NC  Filed Vitals:   11/06/13 0301  BP: 140/93  Pulse: 87  Temp: 98.2 F (36.8 C)  Resp: 13     Physical Exam: Neuro:  In tact Incision:  C/d/i  CBC    Component Value Date/Time   WBC 12.9* 11/05/2013 0244   RBC 3.85* 11/05/2013 0244   HGB 10.4* 11/05/2013 0244   HCT 31.9* 11/05/2013 0244   PLT 174 11/05/2013 0244   MCV 82.9 11/05/2013 0244   MCH 27.0 11/05/2013 0244   MCHC 32.6 11/05/2013 0244   RDW 14.4 11/05/2013 0244   LYMPHSABS 3.9 11/01/2013 0930   MONOABS 0.7 11/01/2013 0930   EOSABS 0.2 11/01/2013 0930   BASOSABS 0.0 11/01/2013 0930    BMET    Component Value Date/Time   NA 143 11/05/2013 0244   K 3.7 11/05/2013 0244   CL 108 11/05/2013 0244   CO2 22 11/05/2013 0244   GLUCOSE 84 11/05/2013 0244   BUN 12 11/05/2013 0244   CREATININE 0.88 11/05/2013 0244   CREATININE 1.07 10/15/2013 0929   CALCIUM 8.6 11/05/2013 0244   GFRNONAA 68* 11/05/2013 0244   GFRAA 79* 11/05/2013 0244     Intake/Output Summary (Last 24 hours) at 11/06/13 0734 Last data filed at 11/06/13 0320  Gross per 24 hour  Intake   2895 ml  Output    600 ml  Net   2295 ml      Assessment/Plan:  This is a 63 y.o. female who is s/p left CEA 2 Days Post-Op  -pt is doing well this am. -pt neuro exam is in tact -pt has ambulated -pt has voided -PT has signed off-OT recommending 4 weeks of outpatient OT  -pt doing much better this am than yesterday.   -can discharge home from vascular standpoint. -f/u with Dr. Scot Dock in 2 weeks. (office will arrange appt)  Leontine Locket, PA-C [facility] [phone]

## 2013-11-06 NOTE — Discharge Instructions (Signed)
Carotid Endarterectomy  A carotid endarterectomy is a surgery to remove a blockage in the carotid arteries. The carotid arteries are the large blood vessels on both sides of the neck that supply blood to the brain. Carotid artery disease, also called carotid artery stenosis, is the narrowing or blockage of one or both carotid arteries. Carotid artery disease is usually caused by atherosclerosis, which is a buildup of fat and plaque in the arteries. Some plaque buildup normally occurs with aging. The plaque may partially or totally block blood flow or cause a clot to form in the carotid arteries.  LET [facility] CARE PROVIDER KNOW ABOUT:  Any allergies you have.   All medicines you are taking, including vitamins, herbs, eye drops, creams, and over-the-counter medicines.   Use of steroids (by mouth or creams).   Previous problems you or members of your family have had with the use of anesthetics.   Any blood disorders you have.   Previous surgeries you have had.   Medical conditions you have, including diabetes and kidney problems.   Possibility of pregnancy, if this applies.  RISKS AND COMPLICATIONS Generally, carotid endarterectomy is a safe procedure. However, as with any procedure, complications can occur. Possible complications include:  Bleeding.   Infection.   Transient ischemic attacks (TIA). A TIA results from poor blood flow to the brain, but there is no permanent loss of brain function.   Stroke.   Heart attack (myocardial infarction).   High blood pressure (hypertension).   Injury to nerves near the carotid arteries.  BEFORE THE PROCEDURE   Ask your health care provider about changing or stopping your regular medicines.  Do not eat or drink anything for at least 8 hours before the surgery. Ask your health care provider if it is okay to take any needed medicines with a sip of water.   Do not smoke for as long as possible before the surgery. Smoking  will increase the chance of a healing problem after surgery.   You may need to have blood tests, a test to check heart rhythm (electrocardiography), or a test to check blood flow (angiography) done before the surgery.  PROCEDURE  You will be given medicine to make you sleep through the procedure (general anesthetic). After you receive the anesthetic, the surgeon will make a small cut (incision) in your neck to expose the carotid artery. A tube may be inserted into the carotid artery above and below the blockage. This tube will temporarily allow blood to flow around the blockage during the surgery. An incision is made in the carotid artery at the location of the blockage, and the blockage is removed. In some cases, a section of the carotid artery may be removed and a graft patch used to repair the artery. The carotid artery is closed with stitches (sutures). If a tube was inserted into the artery to allow blood flow around the blockage during surgery, the tube will be removed. With the tube removal, blood flow to the brain is restored through the carotid artery. The incision in the neck is then closed with sutures.  AFTER THE PROCEDURE  You will likely stay in the hospital for 1 to 3 days after the surgery. You may have some pain or an ache in your neck for a couple weeks. This is normal.  Document Released: 04/04/2013 Document Reviewed: 04/04/2013 [facility] Patient Information 2014 [place]. Rehabilitation After a Stroke, Adult A stroke can cause many types of problems. The treatment of stroke involves  three stages: prevention, treatment immediately following a stroke, and rehabilitation after a stroke. HOW IS MY REHABILITATION PLAN DEVELOPED? A detailed exam by your health care provider helps outline what problems were caused by the stroke. Your health care provider may consult specialists. The specialists may include doctors, occupational and physical therapists, and speech therapists. It is  then possible to make a plan that best fits your needs.  Your evaluation might include the following: Evaluation of your ability to do daily activities that require using muscles, coordination, vision, reasoning, memory, and problem solving. Interviews with you and your health care provider will help determine what you could do and could not do before the stroke. Evaluation of your ability to do personal self-care tasks, such as dressing, grooming, and eating.  Tests to see if there are sensory and motor changes due to the stroke, especially in the hands and legs.  WHAT ARE THE TYPES OF REHABILITATION? Your health care provider may have you start rehabilitation right away depending on the type and severity of your stroke. Rehabilitation after stroke is focused on getting function back and preventing another stroke. Rehabilitation might include:  Physical therapy. This can include help with walking, sitting, lying down, and balance. It may also be designed to help prevent shortening of the muscles (contractures) and swelling (edema).  Occupational therapy. This therapy helps you to relearn skills needed for leading a normal life. These could include eating, using the restroom, dressing, and taking care of yourself. It helps to make you more independent.  Vision therapy. This can help you to retrain, strengthen, and improve your vision after a stroke.  Speech therapy. This can help to improve your speech and communication skills. It also teaches you and your family members to cope with problems of being unable to communicate.  Cognitive therapy. This therapy can help with problems caused by lack of memory, attention, or concentration.  Psychological or psychiatric therapy. This can help you cope with problems of frustration and emotional problems that may develop after a stroke.  Document Released: 08/22/2007 Document Revised: 04/04/2013 Document Reviewed: 01/04/2013 [facility] Patient Information  2014 [place]. Ischemic Stroke A stroke (cerebrovascular accident) is the sudden death of brain tissue. It is a medical emergency. A stroke can cause permanent loss of brain function. This can cause problems with different parts of your body. A transient ischemic attack (TIA) is different because it does not cause permanent damage. A TIA is a short-lived problem of poor blood flow affecting a part of the brain. A TIA is also a serious problem because having a TIA greatly increases the chances of having a stroke. When symptoms first develop, you cannot know if the problem might be a stroke or TIA. CAUSES  A stroke is caused by a decrease of oxygen supply to an area of your brain. It is usually the result of a small blood clot or collection of cholesterol or fat (plaque) that blocks blood flow in the brain. A stroke can also be caused by blocked or damaged carotid arteries.  RISK FACTORS High blood pressure (hypertension). High cholesterol. Diabetes mellitus. Heart disease. The build up of plaque in the blood vessels (peripheral artery disease or atherosclerosis). The build up of plaque in the blood vessels providing blood and oxygen to the brain (carotid artery stenosis). An abnormal heart rhythm (atrial fibrillation). Obesity. Smoking. Taking oral contraceptives (especially in combination with smoking). Physical inactivity. A diet high in fats, salt (sodium), and calories. Alcohol use. Use of illegal  drugs (especially cocaine and methamphetamine). Being African American. Being over the age of 69. Family history of stroke. Previous history of blood clots, stroke, TIA, or heart attack. Sickle cell disease. SYMPTOMS  These symptoms usually develop suddenly, or may be newly present upon awakening from sleep: Sudden weakness or numbness of the face, arm, or leg, especially on one side of the body. Sudden trouble walking or difficulty moving arms or legs. Sudden confusion. Sudden  personality changes. Trouble speaking (aphasia) or understanding. Difficulty swallowing. Sudden trouble seeing in one or both eyes. Double vision. Dizziness. Loss of balance or coordination. Sudden severe headache with no known cause. Trouble reading or writing. DIAGNOSIS  Your caregiver can often determine the presence or absence of a stroke based on your symptoms, history, and physical exam. Computed tomography (CT) of the brain is usually performed to confirm the stroke, determine causes, and determine stroke severity. Other tests may be done to find the cause of the stroke. These tests may include: Electrocardiography. Continuous heart monitoring. Echocardiography. Carotid ultrasonography. Magnetic resonance imaging (MRI). A scan of the brain circulation. Blood tests. PREVENTION  The risk of a stroke can be decreased by appropriately treating high blood pressure, high cholesterol, diabetes, heart disease, and obesity and by quitting smoking, limiting alcohol, and staying physically active. TREATMENT  Time is of the essence. It is important to seek treatment within 3 4 hours of the start of symptoms because you may receive a medicine to dissolve the clot (thrombolytic) that cannot be given after that time. Even if you do not know when your symptoms began, get treatment as soon as possible. After the 4 hour window has passed, treatment may include rest, oxygen, intravenous (IV) fluids, and medicines to thin the blood (anticoagulants). Treatment of stroke depends on the duration, severity, and cause of your symptoms. Medicines and diet may be used to address diabetes, high blood pressure, and other risk factors. Physical, speech, and occupational therapists will assess you and work to improve any functions impaired by the stroke. Measures will be taken to prevent short-term and long-term complications, including infection from breathing foreign material into the lungs (aspiration pneumonia),  blood clots in the legs, bedsores, and falls. Rarely, surgery may be needed to remove large blood clots or to open up blocked arteries. HOME CARE INSTRUCTIONS  Take all medicines prescribed by your caregiver. Follow the directions carefully. Medicines may be used to control risk factors for a stroke. Be sure you understand all your medicine instructions. You may be told to take aspirin or the anticoagulant warfarin. Warfarin needs to be taken exactly as instructed. Too much and too little warfarin are both dangerous. Too much warfarin increases the risk of bleeding. Too little warfarin continues to allow the risk for blood clots. While taking warfarin, you will need to have regular blood tests to measure your blood clotting time. These blood tests usually include both the PT and INR tests. The PT and INR results allow your caregiver to adjust your dose of warfarin. The dose can change for many reasons. It is critically important that you take warfarin exactly as prescribed, and that you have your PT and INR levels drawn exactly as directed. Many foods, especially foods high in vitamin K can interfere with warfarin and affect the PT and INR results. Foods high in vitamin K include spinach, kale, broccoli, cabbage, collard and turnip greens, brussels sprouts, peas, cauliflower, seaweed, and parsley as well as beef and pork liver, green tea, and soybean oil. You  should eat a consistent amount of foods high in vitamin K. Avoid major changes in your diet, or notify your caregiver before changing your diet. Arrange a visit with a dietitian to answer your questions. Many medicines can interfere with warfarin and affect the PT and INR results. You must tell your caregiver about any and all medicines you take, this includes all vitamins and supplements. Be especially cautious with aspirin and anti-inflammatory medicines. Do not take or discontinue any prescribed or over-the-counter medicine except on the advice of your  caregiver or pharmacist. Warfarin can have side effects, such as excessive bruising or bleeding. You will need to hold pressure over cuts for longer than usual. Your caregiver or pharmacist will discuss other potential side effects. Avoid sports or activities that may cause injury or bleeding. Be mindful when shaving, flossing your teeth, or handling sharp objects. Alcohol can change the body's ability to handle warfarin. It is best to avoid alcoholic drinks or consume only very small amounts while taking warfarin. Notify your caregiver if you change your alcohol intake. Notify your dentist or other caregivers before procedures. If swallow studies have determined that your swallowing reflex is present, you should eat healthy foods. A diet that includes 5 or more servings of fruits and vegetables a day may reduce the risk of stroke. Foods may need to be a special consistency (soft or pureed), or small bites may need to be taken in order to avoid aspirating or choking. Certain diets may be prescribed to address high blood pressure, high cholesterol, diabetes, or obesity. A low-sodium, low-saturated fat, low-trans fat, low-cholesterol diet is recommended to manage high blood pressure. A low-saturated fat, low-trans fat, low-cholesterol, and high-fiber diet may control cholesterol levels. A controlled-carbohydrate, controlled-sugar diet is recommended to manage diabetes. A reduced-calorie, low-sodium, low-saturated fat, low-trans fat, low-cholesterol diet is recommended to manage obesity. Maintain a healthy weight. Stay physically active. It is recommended that you get at least 30 minutes of activity on most or all days. Do not smoke. Limit alcohol use even if you are not taking warfarin. Moderate alcohol use is considered to be: No more than 2 drinks each day for men. No more than 1 drink each day for nonpregnant women. Stop drug abuse. Home safety. A safe home environment is important to reduce the  risk of falls. Your caregiver may arrange for specialists to evaluate your home. Having grab bars in the bedroom and bathroom is often important. Your caregiver may arrange for equipment to be used at home, such as raised toilets and a seat for the shower. Physical, occupational, and speech therapy. Ongoing therapy may be needed to maximize your recovery after a stroke. If you have been advised to use a walker or a cane, use it at all times. Be sure to keep your therapy appointments. Follow all instructions for follow-up with your caregiver. This is very important. This includes any referrals, physical therapy, rehabilitation, and lab tests. Proper follow up can prevent another stroke from occurring. SEEK MEDICAL CARE IF: You have personality changes. You have difficulty swallowing. You are seeing double. You have dizziness. You have a fever. You have skin breakdown. SEEK IMMEDIATE MEDICAL CARE IF:  Any of these symptoms may represent a serious problem that is an emergency. Do not wait to see if the symptoms will go away. Get medical help right away. Call your local emergency services (911 in [place] Do not drive yourself to the hospital. You have sudden weakness or numbness of the face, arm,  or leg, especially on one side of the body. You have sudden trouble walking or difficulty moving arms or legs. You have sudden confusion. You have trouble speaking (aphasia) or understanding. You have sudden trouble seeing in one or both eyes. You have a loss of balance or coordination. You have a sudden, severe headache with no known cause. You have new chest pain or an irregular heartbeat. You have a partial or total loss of consciousness.  Document Released: 08/02/2005 Document Revised: 04/04/2013 Document Reviewed: 03/12/2012 [facility] Patient Information 2014 [place]. Diabetes and Small Vessel Disease Small vessel disease (microvascular disease) includes nephropathy, retinopathy, and  neuropathy. People with diabetes are at risk for these problems, but keeping blood glucose (sugar) controlled is helpful in preventing problems. DIABETIC KIDNEY PROBLEMS (DIABETIC NEPHROPATHY)  Diabetic nephropathy occurs in many patients with diabetes.  Damage to the small vessels in the kidneys is the leading cause of end-stage renal disease (ESRD).  Protein in the urine (albuminuria) in the range of 30 to 300 mg/24 h (microalbuminuria) is a sign of the earliest stage of diabetic nephropathy.  Good blood glucose (sugar) and blood pressure control significantly reduce the progression of nephropathy. DIABETIC EYE PROBLEMS (DIABETIC RETINOPATHY)  Diabetic retinopathy is the most common cause of new cases of blindness in adults. It is related to the number of years you have had diabetes.  Common risk factors include high blood sugar (hyperglycemia), high blood pressure (hypertension), and poorly controlled blood lipids such as high blood cholesterol (hypercholesterolemia). DIABETIC NERVE PROBLEMS (DIABETIC NEUROPATHY) Diabetic neuropathy is the most common, long-term complication of diabetes. It is responsible for more than half of leg amputations not due to accidents. The main risk for developing diabetic neuropathy seems to be uncontrolled blood sugars. Hyperglycemia damages the nerve fibers causing sensation (feeling) problems. The closer you can keep the following guidelines, the better chance you will have avoiding problems from small vessel disease.  Working toward near normal blood glucose or as normal as possible. You will need to keep your blood glucose and A1c at the target range prescribed by your caregiver.  Keep your blood pressure less than 120/80.  Keep your low-density lipoprotein (LDL) cholesterol (one of the fats in your blood) at less than 100 mg/dL. An LDL less than 70 mg/dL may be recommended for high risk patients. You cannot change your family history, but it is important  to change the risk factors that you can. Risk factors you can control include:  Controlling high blood pressure.  Stopping smoking.  Using alcohol only in moderation. Generally, this means about one drink per day for women and two drinks per day for men.  Controlling your blood lipids (cholesterol and triglycerides).  Treating heart problems, if these are contributing to risk. SEEK MEDICAL CARE IF:   You are having problems keeping your blood glucose in goal range.  You notice a change in your vision or new problems with your vision.  You have wound or sore that does not heal.  Your blood pressure is above the target range. Document Released: 08/05/2003 Document Revised: 07/19/2012 Document Reviewed: 01/10/2009 [facility] Patient Information 2014 [place], [place].

## 2013-11-06 NOTE — Progress Notes (Signed)
Inpatient Rehabilitation  I note that PT is not following due to pt's hight level of function and that OT is recommending OP OT if pt's R UE does not improve,  but has also signed off in the acute setting.  No need for IP rehab consult.  Will sign off.  Please call if questions.  Thanks!  [place] Admissions Coordinator Cell [phone] Office [phone]

## 2013-11-12 ENCOUNTER — Other Ambulatory Visit: Payer: Self-pay

## 2013-11-12 ENCOUNTER — Telehealth: Payer: Self-pay

## 2013-11-12 ENCOUNTER — Telehealth: Payer: Self-pay | Admitting: Internal Medicine

## 2013-11-12 DIAGNOSIS — I639 Cerebral infarction, unspecified: Secondary | ICD-10-CM

## 2013-11-12 NOTE — Telephone Encounter (Signed)
Returned patient call Is being seen at the ENT today-Dr rosen Needs referral to ENT and vascular surgeon

## 2013-11-12 NOTE — Telephone Encounter (Signed)
Pt was seen in ED and admitted for stroke on 11/01/13.  Pt was referred by [facility] to see Dr Constance Holster for f/u but because pt has [facility] orange card she would need to be referred by [facility] for [facility] to be applied as coverage.  Pt has appt with Dr Constance Holster on 11/12/13 at 1:30pm and is not sure whether to cancel that appt or whether she can be referred by [facility] before that time.  Please f/u with pt. Pt has [facility] scheduled for 11/23/13. Please f/u with pt

## 2013-11-20 ENCOUNTER — Ambulatory Visit: Payer: No Typology Code available for payment source | Attending: Internal Medicine | Admitting: Internal Medicine

## 2013-11-20 ENCOUNTER — Encounter: Payer: Self-pay | Admitting: Family

## 2013-11-20 VITALS — BP 144/80 | HR 78 | Temp 98.0°F | Resp 16 | Ht 62.0 in | Wt 145.0 lb

## 2013-11-20 DIAGNOSIS — E039 Hypothyroidism, unspecified: Secondary | ICD-10-CM | POA: Insufficient documentation

## 2013-11-20 LAB — GLUCOSE, POCT (MANUAL RESULT ENTRY): POC Glucose: 128 mg/dl — AB (ref 70–99)

## 2013-11-20 MED ORDER — LEVOTHYROXINE SODIUM 50 MCG PO TABS: 125.0000 ug | ORAL_TABLET | Freq: Every day | ORAL | Status: DC

## 2013-11-20 MED ORDER — BUPROPION HCL ER (XL) 150 MG PO TB24: ORAL_TABLET | ORAL | Status: DC

## 2013-11-20 NOTE — Progress Notes (Signed)
Patient ID: Sonia Skinner, female   DOB: April 07, 1951, 63 y.o.   MRN: [id number]   CC:  HPI: 63 year old female with a history of hypertension, diabetes, hypothyroidism, nicotine dependence who was admitted on 3/19 additional complaints and left MCA stroke with right-sided weakness. MRI brain showed patchy acute left MCA infarct  -MRI of brain suggests possible left ICA stenosis in the cavernous segment versus left neck  -carotid u/s/CT angio of the neck-->80% L-carotid stenosis  -echocardiogram--EF60-65%, no WMA, grade 1 diastolic dysfx; no embolic source  -LDL [id number] -statin added this admission  -PT evaluated with no recommendations-   OT recommended if RUE spasms and weakness persisted 4 weeks after discharge then pursue OP follow up. Patient has been working at home, doing exercise and slowly gaining her strength back. -Neurology followed-OP follow up in 2 months was recommended Compliant with ASA 325mg  . No residual symptoms.  Because of her left carotid artery stenosis the patient underwent carotid endarterectomy on 11/04/13. Has an appointment with Dr. Scot Dock tomorrow at 9 AM  Patient was also found to have a 17 mm incidental nodule. She saw Dr. Constance Holster ENT, and was asked to come back in 6 months for the parotid nodule to be reevaluated   Blood pressures in the 0000000 systolic. Patient was asked to stop amlodipine take lisinopril. She is doing the opposite.    Allergies  Allergen Reactions  . Anesthetics, Ester Anaphylaxis   Past Medical History  Diagnosis Date  . Hypertension   . Thyroid disease   . Complication of anesthesia     ALLERY TO ESTER BASE  . Diabetes mellitus     INSULIN DEPENDENT  . GERD (gastroesophageal reflux disease)    Current Outpatient Prescriptions on File Prior to Visit  Medication Sig Dispense Refill  . aspirin 325 MG tablet Take 1 tablet (325 mg total) by mouth daily.      Marland Kitchen atorvastatin (LIPITOR) 40 MG tablet Take 1 tablet (40 mg total) by mouth daily  at 6 PM.  30 tablet  0  . carbamide peroxide (DEBROX) 6.5 % otic solution Place 5 drops into the left ear 2 (two) times daily.  15 mL  1  . docusate sodium 100 MG CAPS Take 100 mg by mouth daily.  10 capsule  0  . glipiZIDE (GLUCOTROL) 10 MG tablet Take 2 tablets (20 mg total) by mouth 2 (two) times daily before a meal.  120 tablet  3  . insulin glargine (LANTUS) 100 UNIT/ML injection Inject 0.8 mLs (80 Units total) into the skin at bedtime.  80 mL  3  . lisinopril-hydrochlorothiazide (PRINZIDE,ZESTORETIC) 20-25 MG per tablet Take 1 tablet by mouth daily.  90 tablet  3  . nicotine (NICODERM CQ - DOSED IN MG/24 HOURS) 14 mg/24hr patch Place 1 patch (14 mg total) onto the skin daily.  28 patch  0  . omeprazole (PRILOSEC) 40 MG capsule TAKE 1 CAPSULE BY MOUTH ONCE DAILY  30 capsule  3  . traMADol (ULTRAM) 50 MG tablet 1-2 tabs every 8 hours as needed pain  60 tablet  0  . acetaminophen (TYLENOL) 325 MG tablet Take 650 mg by mouth every 6 (six) hours as needed for headache.      Marland Kitchen HYDROcodone-acetaminophen (NORCO/VICODIN) 5-325 MG per tablet Take 1-2 tablets by mouth every 6 (six) hours as needed for moderate pain.  30 tablet  0   No current facility-administered medications on file prior to visit.   Family History  Problem Relation Age  of Onset  . Diabetes Mother   . Heart disease Mother   . Heart disease Father   . Hypertension Sister   . Diabetes Sister    History   Social History  . Marital Status: Legally Separated    Spouse Name: N/A    Number of Children: N/A  . Years of Education: N/A   Occupational History  . Not on file.   Social History Main Topics  . Smoking status: Former Smoker -- 0.20 packs/day for 20 years    Types: Cigarettes    Quit date: 11/15/2013  . Smokeless tobacco: Never Used  . Alcohol Use: No  . Drug Use: No  . Sexual Activity: No   Other Topics Concern  . Not on file   Social History Narrative  . No narrative on file    Review of Systems   Constitutional: Negative for fever, chills, diaphoresis, activity change, appetite change and fatigue.  HENT: Negative for ear pain, nosebleeds, congestion, facial swelling, rhinorrhea, neck pain, neck stiffness and ear discharge.   Eyes: Negative for pain, discharge, redness, itching and visual disturbance.  Respiratory: Negative for cough, choking, chest tightness, shortness of breath, wheezing and stridor.   Cardiovascular: Negative for chest pain, palpitations and leg swelling.  Gastrointestinal: Negative for abdominal distention.  Genitourinary: Negative for dysuria, urgency, frequency, hematuria, flank pain, decreased urine volume, difficulty urinating and dyspareunia.  Musculoskeletal: Negative for back pain, joint swelling, arthralgias and gait problem.  Neurological: Negative for dizziness, tremors, seizures, syncope, facial asymmetry, speech difficulty, weakness, light-headedness, numbness and headaches.  Hematological: Negative for adenopathy. Does not bruise/bleed easily.  Psychiatric/Behavioral: Negative for hallucinations, behavioral problems, confusion, dysphoric mood, decreased concentration and agitation.    Objective:   Filed Vitals:   11/20/13 1404  BP: 144/80  Pulse: 78  Temp: 98 F (36.7 C)  Resp: 16    Physical Exam  Constitutional: Appears well-developed and well-nourished. No distress.  HENT: Normocephalic. External right and left ear normal. Oropharynx is clear and moist.  Eyes: Conjunctivae and EOM are normal. PERRLA, no scleral icterus.  Neck: Normal ROM. Neck supple. No JVD. No tracheal deviation. No thyromegaly.  CVS: RRR, S1/S2 +, no murmurs, no gallops, no carotid bruit.  Pulmonary: Effort and breath sounds normal, no stridor, rhonchi, wheezes, rales.  Abdominal: Soft. BS +,  no distension, tenderness, rebound or guarding.  Musculoskeletal: Normal range of motion. No edema and no tenderness.  Lymphadenopathy: No lymphadenopathy noted, cervical,  inguinal. Neuro: Alert. Normal reflexes, muscle tone coordination. No cranial nerve deficit. Skin: Skin is warm and dry. No rash noted. Not diaphoretic. No erythema. No pallor.  Psychiatric: Normal mood and affect. Behavior, judgment, thought content normal.   Lab Results  Component Value Date   WBC 12.9* 11/05/2013   HGB 10.4* 11/05/2013   HCT 31.9* 11/05/2013   MCV 82.9 11/05/2013   PLT 174 11/05/2013   Lab Results  Component Value Date   CREATININE 0.88 11/05/2013   BUN 12 11/05/2013   NA 143 11/05/2013   K 3.7 11/05/2013   CL 108 11/05/2013   CO2 22 11/05/2013    Lab Results  Component Value Date   HGBA1C 10.5* 11/02/2013   Lipid Panel     Component Value Date/Time   CHOL 221* 11/02/2013 0225   TRIG 190* 11/02/2013 0225   HDL 36* 11/02/2013 0225   CHOLHDL 6.1 11/02/2013 0225   VLDL 38 11/02/2013 0225   LDLCALC 147* 11/02/2013 0225       Assessment  and plan:   Patient Active Problem List   Diagnosis Date Noted  . Carotid stenosis 11/03/2013  . Parotid mass 11/02/2013  . Acute ischemic left MCA stroke 11/02/2013  . Type II or unspecified type diabetes mellitus with neurological manifestations, not stated as uncontrolled 11/02/2013  . CVA (cerebral vascular accident) 11/01/2013  . TIA (transient ischemic attack) 11/01/2013  . Unspecified vitamin D deficiency 10/19/2013  . Tooth pain 07/31/2013  . Tobacco abuse 07/31/2013  . Essential hypertension, benign 04/20/2013  . Type II or unspecified type diabetes mellitus 04/20/2013  . Unspecified hypothyroidism 04/20/2013  . Other and unspecified hyperlipidemia 04/20/2013   Diabetes Last A1c was 10.3 Currently taking Lantus 45 units twice a day Continue glipizide History of diarrhea with metformin  hypertension Hold Norvasc Continue lisinopril/HCTZ   CVA Status post left carotid endarterectomy Followup at [facility] neurology in 2 months No indication for physical therapy occupational therapy at this time Patient has  resumed driving She can go back to work on 11/28/13 She has quit smoking and currently uses a nicotine patch   Hypothyroidism Repeat TSH and free T4 Adjust Synthroid based on lab     Follow up in 6 weeks  The patient was given clear instructions to go to ER or return to medical center if symptoms don't improve, worsen or new problems develop. The patient verbalized understanding. The patient was told to call to get any lab results if not heard anything in the next week.

## 2013-11-20 NOTE — Progress Notes (Signed)
HFU Pt is following up after she had a stroke last week.

## 2013-11-21 ENCOUNTER — Encounter: Payer: Self-pay | Admitting: Family

## 2013-11-21 ENCOUNTER — Ambulatory Visit (INDEPENDENT_AMBULATORY_CARE_PROVIDER_SITE_OTHER): Payer: Self-pay | Admitting: Family

## 2013-11-21 VITALS — BP 150/69 | HR 76 | Temp 98.5°F | Resp 16 | Ht 62.0 in | Wt 146.0 lb

## 2013-11-21 DIAGNOSIS — Z48812 Encounter for surgical aftercare following surgery on the circulatory system: Secondary | ICD-10-CM

## 2013-11-21 DIAGNOSIS — Z09 Encounter for follow-up examination after completed treatment for conditions other than malignant neoplasm: Secondary | ICD-10-CM | POA: Insufficient documentation

## 2013-11-21 DIAGNOSIS — R51 Headache: Secondary | ICD-10-CM

## 2013-11-21 DIAGNOSIS — I6529 Occlusion and stenosis of unspecified carotid artery: Secondary | ICD-10-CM

## 2013-11-21 DIAGNOSIS — R519 Headache, unspecified: Secondary | ICD-10-CM

## 2013-11-21 LAB — TSH: TSH: 0.294 u[IU]/mL — ABNORMAL LOW (ref 0.350–4.500)

## 2013-11-21 LAB — T4, FREE: Free T4: 1.63 ng/dL (ref 0.80–1.80)

## 2013-11-21 LAB — T3, FREE: T3, Free: 3.3 pg/mL (ref 2.3–4.2)

## 2013-11-21 MED ORDER — HYDROCODONE-ACETAMINOPHEN 5-325 MG PO TABS: 1.0000 | ORAL_TABLET | Freq: Four times a day (QID) | ORAL | Status: DC | PRN

## 2013-11-21 NOTE — Progress Notes (Signed)
Established Carotid Patient   History of Present Illness  Sonia Skinner is a 63 y.o. female who is s/p left CEA, with a stenosis of 95%, on 11/04/2013 by Dr. Scot Dock.  She returns today for 2 weeks post op check.  She has occasional tingling and numbness in both arms, reports rare dizziness if she rotates her head quickly. Patient has Positive history of TIA or stroke symptom on 11/01/2013 as manifested by right hemiparesis, decreased vision in left eye, denies expressive aphasia, denies uniltaeral facial drooping.   She is doing occupational therapy, is left hand dominant, still has some right arm loss of dexterity, denies right leg weakness.   Patient denies New Medical or Surgical History. Right incision is 8/10 pain at times, trouble sleeping at night at times, feels a pulling sensation.  Pt Diabetic: Yes, states she is working on better control Pt smoker: former smoker, quit March, 2015  Pt meds include: Statin : Yes ASA: Yes Other anticoagulants/antiplatelets: no   Past Medical History  Diagnosis Date  . Hypertension   . Thyroid disease   . Complication of anesthesia     ALLERY TO ESTER BASE  . Diabetes mellitus     INSULIN DEPENDENT  . GERD (gastroesophageal reflux disease)   . Carotid artery occlusion     Social History History  Substance Use Topics  . Smoking status: Former Smoker -- 0.20 packs/day for 20 years    Types: Cigarettes    Quit date: 11/15/2013  . Smokeless tobacco: Never Used  . Alcohol Use: No    Family History Family History  Problem Relation Age of Onset  . Diabetes Mother   . Heart disease Mother     Before age 38  . Cancer Father     Lung  . Hypertension Sister   . Diabetes Sister   . Diabetes Sister   . Diabetes Sister     Surgical History Past Surgical History  Procedure Laterality Date  . Abdominal hysterectomy    . Endarterectomy Left 11/04/2013    Procedure: ENDARTERECTOMY CAROTID;  Surgeon: Angelia Mould, MD;   Location: [facility] OR;  Service: Vascular;  Laterality: Left;  . Carotid endarterectomy Left 11-04-13    cea    Allergies  Allergen Reactions  . Anesthetics, Ester Anaphylaxis    Current Outpatient Prescriptions  Medication Sig Dispense Refill  . acetaminophen (TYLENOL) 325 MG tablet Take 650 mg by mouth every 6 (six) hours as needed for headache.      Marland Kitchen aspirin 325 MG tablet Take 1 tablet (325 mg total) by mouth daily.      Marland Kitchen atorvastatin (LIPITOR) 40 MG tablet Take 1 tablet (40 mg total) by mouth daily at 6 PM.  30 tablet  0  . buPROPion (WELLBUTRIN XL) 150 MG 24 hr tablet 2 tabs in AM and 1 in PM  90 tablet  3  . carbamide peroxide (DEBROX) 6.5 % otic solution Place 5 drops into the left ear 2 (two) times daily.  15 mL  1  . docusate sodium 100 MG CAPS Take 100 mg by mouth daily.  10 capsule  0  . glipiZIDE (GLUCOTROL) 10 MG tablet Take 2 tablets (20 mg total) by mouth 2 (two) times daily before a meal.  120 tablet  3  . HYDROcodone-acetaminophen (NORCO/VICODIN) 5-325 MG per tablet Take 1-2 tablets by mouth every 6 (six) hours as needed for moderate pain.  30 tablet  0  . insulin glargine (LANTUS) 100 UNIT/ML injection Inject  0.8 mLs (80 Units total) into the skin at bedtime.  80 mL  3  . levothyroxine (SYNTHROID, LEVOTHROID) 50 MCG tablet Take 2.5 tablets (125 mcg total) by mouth daily before breakfast.  120 tablet  3  . lisinopril-hydrochlorothiazide (PRINZIDE,ZESTORETIC) 20-25 MG per tablet Take 1 tablet by mouth daily.  90 tablet  3  . nicotine (NICODERM CQ - DOSED IN MG/24 HOURS) 14 mg/24hr patch Place 1 patch (14 mg total) onto the skin daily.  28 patch  0  . omeprazole (PRILOSEC) 40 MG capsule TAKE 1 CAPSULE BY MOUTH ONCE DAILY  30 capsule  3  . traMADol (ULTRAM) 50 MG tablet 1-2 tabs every 8 hours as needed pain  60 tablet  0   No current facility-administered medications for this visit.    Review of Systems : See HPI for pertinent positives and negatives.  Physical  Examination  Filed Vitals:   11/21/13 0935  BP: 150/69  Pulse: 76  Temp: 98.5 F (36.9 C)  Resp: 16   Filed Weights   11/21/13 0935  Weight: 146 lb (66.225 kg)   Body mass index is 26.7 kg/(m^2).   General: WDWN female in NAD GAIT: normal Eyes: PERRLA Pulmonary:  Non-labored, CTAB, Negative  Rales, Negative rhonchi, & Negative wheezing.  Cardiac: regular Rhythm ,  Negative detected murmur.  VASCULAR EXAM Carotid Bruits Left Right   Negative Positive   Radial pulses are 2+ palpable and equal.                                                                                                                            LE Pulses LEFT RIGHT       POPLITEAL  not palpable   not palpable       POSTERIOR TIBIAL  not palpable   not palpable        DORSALIS PEDIS      ANTERIOR TIBIAL  palpable   palpable     Gastrointestinal: soft, nontender, BS WNL, no r/g,  negative masses.  Musculoskeletal: Negative muscle atrophy/wasting. M/S 5/5 throughout, Extremities without ischemic changes.  Neurologic: A&O X 3; Appropriate Affect ; SENSATION ;  Equal bilaterally except for mild numbness along left CEA incision and left jaw line. Speech is normal CN 2-12 intact, Pain and light touch intact in extremities, Motor exam as listed above.  Previous angiogram: CTA of neck on 11/03/2013: Right common carotid artery is widely patent to the bifurcation  region. There is atherosclerotic plaque at the carotid bifurcation  but the minimal diameter of the ICA is 4 mm. Compared to a more  distal cervical ICA diameter of 4 mm, there is no stenosis.  Assessment: Sonia Skinner is a 63 y.o. female who is s/p left CEA, with a stenosis of 95%, on 11/04/2013, presents for 2 weeks post operative visit. She has no dyspnea, no hoarseness, no dysphagia. She does have some numbness at the left CEA incision and left jaw line  which is likely to improve over time.  Plan:  She has a pulling sensation and pain  at the left CEA incision, hydrocodone-APAP 3-325 reordered, 1-2 tabs every 6 hours prn pain, #30, 0 refills. Work note to be off from work for another month while she continues occupational therapy.  Follow-up in1 months for re evaluation, and 6 months for Carotid Duplex scan, she knows to return sooner if she has problems or concerns.   I discussed in depth with the patient the nature of atherosclerosis, and emphasized the importance of maximal medical management including strict control of blood pressure, blood glucose, and lipid levels, obtaining regular exercise, and continued cessation of smoking.  The patient is aware that without maximal medical management the underlying atherosclerotic disease process will progress, limiting the benefit of any interventions. The patient was given information about stroke prevention and what symptoms should prompt the patient to seek immediate medical care. Thank you for allowing [place] to participate in this patient's care.  Clemon Chambers, RN, MSN, FNP-C [facility] Office: [phone]  Clinic Physician: Early  11/21/2013 9:44 AM

## 2013-11-21 NOTE — Patient Instructions (Signed)

## 2013-11-23 ENCOUNTER — Inpatient Hospital Stay: Payer: No Typology Code available for payment source

## 2013-11-25 ENCOUNTER — Encounter (HOSPITAL_COMMUNITY): Payer: Self-pay | Admitting: Emergency Medicine

## 2013-11-25 ENCOUNTER — Other Ambulatory Visit: Payer: Self-pay

## 2013-11-25 ENCOUNTER — Emergency Department (HOSPITAL_COMMUNITY): Payer: No Typology Code available for payment source

## 2013-11-25 ENCOUNTER — Observation Stay (HOSPITAL_COMMUNITY): Payer: No Typology Code available for payment source

## 2013-11-25 ENCOUNTER — Observation Stay (HOSPITAL_COMMUNITY)
Admission: EM | Admit: 2013-11-25 | Discharge: 2013-11-26 | Disposition: A | Discharge status: Home / Self Care | Payer: No Typology Code available for payment source | Attending: Internal Medicine | Admitting: Internal Medicine

## 2013-11-25 DIAGNOSIS — R42 Dizziness and giddiness: Secondary | ICD-10-CM | POA: Insufficient documentation

## 2013-11-25 DIAGNOSIS — Z48812 Encounter for surgical aftercare following surgery on the circulatory system: Secondary | ICD-10-CM

## 2013-11-25 DIAGNOSIS — F3289 Other specified depressive episodes: Secondary | ICD-10-CM | POA: Insufficient documentation

## 2013-11-25 DIAGNOSIS — E039 Hypothyroidism, unspecified: Secondary | ICD-10-CM | POA: Insufficient documentation

## 2013-11-25 DIAGNOSIS — IMO0001 Reserved for inherently not codable concepts without codable children: Secondary | ICD-10-CM | POA: Insufficient documentation

## 2013-11-25 DIAGNOSIS — F329 Major depressive disorder, single episode, unspecified: Secondary | ICD-10-CM | POA: Insufficient documentation

## 2013-11-25 DIAGNOSIS — I10 Essential (primary) hypertension: Secondary | ICD-10-CM | POA: Insufficient documentation

## 2013-11-25 DIAGNOSIS — K0889 Other specified disorders of teeth and supporting structures: Secondary | ICD-10-CM

## 2013-11-25 DIAGNOSIS — R221 Localized swelling, mass and lump, neck: Secondary | ICD-10-CM

## 2013-11-25 DIAGNOSIS — E559 Vitamin D deficiency, unspecified: Secondary | ICD-10-CM

## 2013-11-25 DIAGNOSIS — E119 Type 2 diabetes mellitus without complications: Secondary | ICD-10-CM | POA: Diagnosis present

## 2013-11-25 DIAGNOSIS — Z720 Tobacco use: Secondary | ICD-10-CM

## 2013-11-25 DIAGNOSIS — Z87891 Personal history of nicotine dependence: Secondary | ICD-10-CM | POA: Insufficient documentation

## 2013-11-25 DIAGNOSIS — E785 Hyperlipidemia, unspecified: Secondary | ICD-10-CM | POA: Insufficient documentation

## 2013-11-25 DIAGNOSIS — G459 Transient cerebral ischemic attack, unspecified: Secondary | ICD-10-CM

## 2013-11-25 DIAGNOSIS — R220 Localized swelling, mass and lump, head: Secondary | ICD-10-CM

## 2013-11-25 DIAGNOSIS — I6529 Occlusion and stenosis of unspecified carotid artery: Secondary | ICD-10-CM | POA: Insufficient documentation

## 2013-11-25 DIAGNOSIS — I63512 Cerebral infarction due to unspecified occlusion or stenosis of left middle cerebral artery: Secondary | ICD-10-CM

## 2013-11-25 DIAGNOSIS — Z794 Long term (current) use of insulin: Secondary | ICD-10-CM | POA: Insufficient documentation

## 2013-11-25 DIAGNOSIS — Z8673 Personal history of transient ischemic attack (TIA), and cerebral infarction without residual deficits: Secondary | ICD-10-CM | POA: Insufficient documentation

## 2013-11-25 DIAGNOSIS — R110 Nausea: Secondary | ICD-10-CM | POA: Insufficient documentation

## 2013-11-25 DIAGNOSIS — E1165 Type 2 diabetes mellitus with hyperglycemia: Secondary | ICD-10-CM

## 2013-11-25 DIAGNOSIS — Z09 Encounter for follow-up examination after completed treatment for conditions other than malignant neoplasm: Secondary | ICD-10-CM

## 2013-11-25 DIAGNOSIS — E118 Type 2 diabetes mellitus with unspecified complications: Secondary | ICD-10-CM | POA: Diagnosis present

## 2013-11-25 DIAGNOSIS — I639 Cerebral infarction, unspecified: Secondary | ICD-10-CM | POA: Diagnosis present

## 2013-11-25 DIAGNOSIS — R51 Headache: Secondary | ICD-10-CM | POA: Insufficient documentation

## 2013-11-25 DIAGNOSIS — Z7982 Long term (current) use of aspirin: Secondary | ICD-10-CM | POA: Insufficient documentation

## 2013-11-25 DIAGNOSIS — K219 Gastro-esophageal reflux disease without esophagitis: Secondary | ICD-10-CM | POA: Insufficient documentation

## 2013-11-25 DIAGNOSIS — F32A Depression, unspecified: Secondary | ICD-10-CM | POA: Diagnosis present

## 2013-11-25 DIAGNOSIS — I63239 Cerebral infarction due to unspecified occlusion or stenosis of unspecified carotid arteries: Secondary | ICD-10-CM | POA: Diagnosis present

## 2013-11-25 DIAGNOSIS — R200 Anesthesia of skin: Secondary | ICD-10-CM

## 2013-11-25 DIAGNOSIS — K118 Other diseases of salivary glands: Secondary | ICD-10-CM

## 2013-11-25 DIAGNOSIS — R209 Unspecified disturbances of skin sensation: Principal | ICD-10-CM | POA: Insufficient documentation

## 2013-11-25 DIAGNOSIS — E1149 Type 2 diabetes mellitus with other diabetic neurological complication: Secondary | ICD-10-CM

## 2013-11-25 DIAGNOSIS — M542 Cervicalgia: Secondary | ICD-10-CM | POA: Insufficient documentation

## 2013-11-25 DIAGNOSIS — F17200 Nicotine dependence, unspecified, uncomplicated: Secondary | ICD-10-CM

## 2013-11-25 DIAGNOSIS — Z9889 Other specified postprocedural states: Secondary | ICD-10-CM | POA: Insufficient documentation

## 2013-11-25 LAB — URINALYSIS, ROUTINE W REFLEX MICROSCOPIC
Bilirubin Urine: NEGATIVE
Glucose, UA: NEGATIVE mg/dL
Hgb urine dipstick: NEGATIVE
Ketones, ur: NEGATIVE mg/dL
Leukocytes, UA: NEGATIVE
Nitrite: NEGATIVE
Protein, ur: NEGATIVE mg/dL
Specific Gravity, Urine: 1.004 — ABNORMAL LOW (ref 1.005–1.030)
Urobilinogen, UA: 0.2 mg/dL (ref 0.0–1.0)
pH: 6 (ref 5.0–8.0)

## 2013-11-25 LAB — TSH: TSH: 0.326 u[IU]/mL — ABNORMAL LOW (ref 0.350–4.500)

## 2013-11-25 LAB — COMPREHENSIVE METABOLIC PANEL
ALT: 16 U/L (ref 0–35)
AST: 22 U/L (ref 0–37)
Albumin: 4 g/dL (ref 3.5–5.2)
Alkaline Phosphatase: 155 U/L — ABNORMAL HIGH (ref 39–117)
BUN: 8 mg/dL (ref 6–23)
CO2: 26 mEq/L (ref 19–32)
Calcium: 9.7 mg/dL (ref 8.4–10.5)
Chloride: 101 mEq/L (ref 96–112)
Creatinine, Ser: 0.89 mg/dL (ref 0.50–1.10)
GFR calc Af Amer: 78 mL/min — ABNORMAL LOW (ref >90)
GFR calc non Af Amer: 67 mL/min — ABNORMAL LOW (ref >90)
Glucose, Bld: 125 mg/dL — ABNORMAL HIGH (ref 70–99)
Potassium: 4.5 mEq/L (ref 3.7–5.3)
Sodium: 143 mEq/L (ref 137–147)
Total Bilirubin: 0.3 mg/dL (ref 0.3–1.2)
Total Protein: 7.5 g/dL (ref 6.0–8.3)

## 2013-11-25 LAB — DIFFERENTIAL
Basophils Absolute: 0.1 10*3/uL (ref 0.0–0.1)
Basophils Relative: 1 % (ref 0–1)
Eosinophils Absolute: 0.3 10*3/uL (ref 0.0–0.7)
Eosinophils Relative: 3 % (ref 0–5)
Lymphocytes Relative: 38 % (ref 12–46)
Lymphs Abs: 3.8 10*3/uL (ref 0.7–4.0)
Monocytes Absolute: 0.8 10*3/uL (ref 0.1–1.0)
Monocytes Relative: 7 % (ref 3–12)
Neutro Abs: 5.2 10*3/uL (ref 1.7–7.7)
Neutrophils Relative %: 51 % (ref 43–77)

## 2013-11-25 LAB — RAPID URINE DRUG SCREEN, HOSP PERFORMED
Amphetamines: NOT DETECTED
Barbiturates: NOT DETECTED
Benzodiazepines: NOT DETECTED
Cocaine: NOT DETECTED
Opiates: NOT DETECTED
Tetrahydrocannabinol: NOT DETECTED

## 2013-11-25 LAB — GLUCOSE, CAPILLARY
Glucose-Capillary: 44 mg/dL — CL (ref 70–99)
Glucose-Capillary: 65 mg/dL — ABNORMAL LOW (ref 70–99)
Glucose-Capillary: 144 mg/dL — ABNORMAL HIGH (ref 70–99)
Glucose-Capillary: 168 mg/dL — ABNORMAL HIGH (ref 70–99)

## 2013-11-25 LAB — CBC
HCT: 38.1 % (ref 36.0–46.0)
Hemoglobin: 12.7 g/dL (ref 12.0–15.0)
MCH: 27.7 pg (ref 26.0–34.0)
MCHC: 33.3 g/dL (ref 30.0–36.0)
MCV: 83 fL (ref 78.0–100.0)
Platelets: 243 10*3/uL (ref 150–400)
RBC: 4.59 MIL/uL (ref 3.87–5.11)
RDW: 14.3 % (ref 11.5–15.5)
WBC: 10.1 10*3/uL (ref 4.0–10.5)

## 2013-11-25 LAB — PROTIME-INR
INR: 1.01 (ref 0.00–1.49)
Prothrombin Time: 13.1 seconds (ref 11.6–15.2)

## 2013-11-25 LAB — I-STAT TROPONIN, ED: Troponin i, poc: 0 ng/mL (ref 0.00–0.08)

## 2013-11-25 LAB — ETHANOL: Alcohol, Ethyl (B): <11 mg/dL (ref 0–11)

## 2013-11-25 LAB — CBG MONITORING, ED: Glucose-Capillary: 90 mg/dL (ref 70–99)

## 2013-11-25 LAB — APTT: aPTT: 29 seconds (ref 24–37)

## 2013-11-25 LAB — MAGNESIUM: Magnesium: 1.4 mg/dL — ABNORMAL LOW (ref 1.5–2.5)

## 2013-11-25 MED ORDER — SENNOSIDES-DOCUSATE SODIUM 8.6-50 MG PO TABS: 1.0000 | ORAL_TABLET | Freq: Every evening | ORAL | Status: DC | PRN

## 2013-11-25 MED ORDER — ENOXAPARIN SODIUM 40 MG/0.4ML ~~LOC~~ SOLN
40.0000 mg | SUBCUTANEOUS | Status: DC
  Administered 2013-11-25: 40 mg via SUBCUTANEOUS
  Filled 2013-11-25 (×2): qty 0.4

## 2013-11-25 MED ORDER — PANTOPRAZOLE SODIUM 40 MG PO TBEC
40.0000 mg | DELAYED_RELEASE_TABLET | Freq: Every day | ORAL | Status: DC
  Administered 2013-11-25 – 2013-11-26 (×2): 40 mg via ORAL
  Filled 2013-11-25 (×2): qty 1

## 2013-11-25 MED ORDER — ACETAMINOPHEN 325 MG PO TABS
650.0000 mg | ORAL_TABLET | Freq: Four times a day (QID) | ORAL | Status: DC | PRN
  Administered 2013-11-25 – 2013-11-26 (×3): 650 mg via ORAL
  Filled 2013-11-25 (×3): qty 2

## 2013-11-25 MED ORDER — LISINOPRIL 20 MG PO TABS
20.0000 mg | ORAL_TABLET | Freq: Every day | ORAL | Status: DC
  Administered 2013-11-25 – 2013-11-26 (×2): 20 mg via ORAL
  Filled 2013-11-25 (×2): qty 1

## 2013-11-25 MED ORDER — LEVOTHYROXINE SODIUM 50 MCG PO TABS
50.0000 ug | ORAL_TABLET | Freq: Every day | ORAL | Status: DC
  Administered 2013-11-26: 50 ug via ORAL
  Filled 2013-11-25 (×2): qty 1

## 2013-11-25 MED ORDER — DOCUSATE SODIUM 100 MG PO CAPS
100.0000 mg | ORAL_CAPSULE | Freq: Every day | ORAL | Status: DC
  Administered 2013-11-25 – 2013-11-26 (×2): 100 mg via ORAL
  Filled 2013-11-25 (×2): qty 1

## 2013-11-25 MED ORDER — BUPROPION HCL ER (XL) 300 MG PO TB24
300.0000 mg | ORAL_TABLET | Freq: Two times a day (BID) | ORAL | Status: DC
  Administered 2013-11-25 – 2013-11-26 (×2): 300 mg via ORAL
  Filled 2013-11-25 (×3): qty 1

## 2013-11-25 MED ORDER — LISINOPRIL-HYDROCHLOROTHIAZIDE 20-25 MG PO TABS: 1.0000 | ORAL_TABLET | Freq: Every day | ORAL | Status: DC

## 2013-11-25 MED ORDER — INSULIN GLARGINE 100 UNIT/ML ~~LOC~~ SOLN
30.0000 [IU] | Freq: Every day | SUBCUTANEOUS | Status: DC
  Administered 2013-11-26: 30 [IU] via SUBCUTANEOUS
  Filled 2013-11-25: qty 0.3

## 2013-11-25 MED ORDER — NICOTINE 14 MG/24HR TD PT24
14.0000 mg | MEDICATED_PATCH | Freq: Every day | TRANSDERMAL | Status: DC
  Administered 2013-11-26: 14 mg via TRANSDERMAL
  Filled 2013-11-25: qty 1

## 2013-11-25 MED ORDER — ASPIRIN 325 MG PO TABS
325.0000 mg | ORAL_TABLET | Freq: Every day | ORAL | Status: DC
  Administered 2013-11-26: 325 mg via ORAL
  Filled 2013-11-25: qty 1

## 2013-11-25 MED ORDER — ATORVASTATIN CALCIUM 40 MG PO TABS
40.0000 mg | ORAL_TABLET | Freq: Every day | ORAL | Status: DC
  Administered 2013-11-25: 40 mg via ORAL
  Filled 2013-11-25 (×2): qty 1

## 2013-11-25 MED ORDER — HYDROCHLOROTHIAZIDE 25 MG PO TABS
25.0000 mg | ORAL_TABLET | Freq: Every day | ORAL | Status: DC
  Administered 2013-11-25 – 2013-11-26 (×2): 25 mg via ORAL
  Filled 2013-11-25 (×2): qty 1

## 2013-11-25 MED ORDER — INSULIN ASPART 100 UNIT/ML ~~LOC~~ SOLN
0.0000 [IU] | Freq: Three times a day (TID) | SUBCUTANEOUS | Status: DC
  Administered 2013-11-26: 1 [IU] via SUBCUTANEOUS
  Administered 2013-11-26: 3 [IU] via SUBCUTANEOUS

## 2013-11-25 NOTE — ED Provider Notes (Signed)
CSN: [id number]     Arrival date & time 11/25/13  1010 History   First MD Initiated Contact with Patient 11/25/13 1013     Chief Complaint  Patient presents with  . Numbness    Left Sided Facial      The history is provided by the patient.  patient reports she woke up with let sided facial numbness and left UE numbness this morning and it has been constant all morning.  Nothing worsens numbness and nothing improves numbness.  She denies focal weakness.  She reports mild HA.  No cp/sob.  No visual changes.  No syncope.  She reports mild nausea.    She reports recent h/o Left carotid surgery.  She reports continued pain over surgical site.  No recent falls/trauma   Sisters are at bedside.  They report pt had left UE weakness while in [place] this morning  Past Medical History  Diagnosis Date  . Hypertension   . Thyroid disease   . Complication of anesthesia     ALLERY TO ESTER BASE  . Diabetes mellitus     INSULIN DEPENDENT  . GERD (gastroesophageal reflux disease)   . Carotid artery occlusion    Past Surgical History  Procedure Laterality Date  . Abdominal hysterectomy    . Endarterectomy Left 11/04/2013    Procedure: ENDARTERECTOMY CAROTID;  Surgeon: Angelia Mould, MD;  Location: [facility] OR;  Service: Vascular;  Laterality: Left;  . Carotid endarterectomy Left 11-04-13    cea   Family History  Problem Relation Age of Onset  . Diabetes Mother   . Heart disease Mother     Before age 52  . Cancer Father     Lung  . Hypertension Sister   . Diabetes Sister   . Diabetes Sister   . Diabetes Sister    History  Substance Use Topics  . Smoking status: Former Smoker -- 0.20 packs/day for 20 years    Types: Cigarettes    Quit date: 11/15/2013  . Smokeless tobacco: Never Used  . Alcohol Use: No   OB History   Grav Para Term Preterm Abortions TAB SAB Ect Mult Living                 Review of Systems  Constitutional: Negative for fever.  Respiratory: Negative for  shortness of breath.   Cardiovascular: Negative for chest pain.  Gastrointestinal: Negative for vomiting.  Neurological: Positive for numbness and headaches.  All other systems reviewed and are negative.     Allergies  Anesthetics, ester  Home Medications   Current Outpatient Rx  Name  Route  Sig  Dispense  Refill  . acetaminophen (TYLENOL) 325 MG tablet   Oral   Take 650 mg by mouth every 6 (six) hours as needed for headache.         Marland Kitchen aspirin 325 MG tablet   Oral   Take 1 tablet (325 mg total) by mouth daily.         Marland Kitchen atorvastatin (LIPITOR) 40 MG tablet   Oral   Take 1 tablet (40 mg total) by mouth daily at 6 PM.   30 tablet   0   . buPROPion (WELLBUTRIN XL) 150 MG 24 hr tablet   Oral   Take 300 mg by mouth 2 (two) times daily. 2 tabs in AM and 1 in PM         . docusate sodium 100 MG CAPS   Oral   Take 100 mg  by mouth daily.   10 capsule   0   . glipiZIDE (GLUCOTROL) 10 MG tablet   Oral   Take 20 mg by mouth 2 (two) times daily before a meal.          . HYDROcodone-acetaminophen (NORCO/VICODIN) 5-325 MG per tablet   Oral   Take 1-2 tablets by mouth every 6 (six) hours as needed for moderate pain.   30 tablet   0   . insulin glargine (LANTUS) 100 UNIT/ML injection   Subcutaneous   Inject 0.8 mLs (80 Units total) into the skin at bedtime.   80 mL   3   . levothyroxine (SYNTHROID, LEVOTHROID) 50 MCG tablet   Oral   Take 50 mcg by mouth daily before breakfast.         . lisinopril-hydrochlorothiazide (PRINZIDE,ZESTORETIC) 20-25 MG per tablet   Oral   Take 1 tablet by mouth daily.   90 tablet   3   . nicotine (NICODERM CQ - DOSED IN MG/24 HOURS) 14 mg/24hr patch   Transdermal   Place 1 patch (14 mg total) onto the skin daily.   28 patch   0   . omeprazole (PRILOSEC) 40 MG capsule   Oral   Take 40 mg by mouth daily.          BP 165/51  Pulse 85  Temp(Src) 98 F (36.7 C) (Oral)  Resp 18  Wt 146 lb (66.225 kg)  SpO2  99% Physical Exam CONSTITUTIONAL: Well developed/well nourished HEAD: Normocephalic/atraumatic EYES: EOMI/PERRL, no nystagmus, no visual field deficit  no ptosis ENMT: Mucous membranes moist NECK: supple no meningeal signs, well healed surgical scar to left neck, no thrill noted. No bruising noted CV: S1/S2 noted, no murmurs/rubs/gallops noted LUNGS: Lungs are clear to auscultation bilaterally, no apparent distress ABDOMEN: soft, nontender, no rebound or guarding GU:no cva tenderness NEURO:Awake/alert, facies symmetric, no arm or leg drift is noted Equal 5/5 strength with shoulder abduction, elbow flex/extension, wrist flex/extension in upper extremities and equal hand grips bilaterally Cranial nerves 3/4/5/6/02/21/09/11/12 tested and intact No dysmetria Pt reports sensory deficit to left face to left UE NIHSS=1 EXTREMITIES: pulses normal, full ROM SKIN: warm, color normal PSYCH: no abnormalities of mood noted  ED Course  Procedures   tPA in stroke considered but not given due to:  Onset over 3-4.5hours Pt s/p left CEA about 3 weeks ago now with left sided facial/arm numbness Imaging/labs ordered Pt currently stable 12:21 PM Pt reports continued numbness, no new weakness D/w neurology dr Doy Mince, recommends admit D/w dr Dyann Kief, triad will admit for probable CVA   Labs Review Labs Reviewed  PROTIME-INR  APTT  CBC  DIFFERENTIAL  ETHANOL  COMPREHENSIVE METABOLIC PANEL  URINE RAPID DRUG SCREEN ([place])  [place], ROUTINE W REFLEX MICROSCOPIC  I-STAT [place], ED   Imaging Review Ct Head Wo Contrast  11/25/2013   CLINICAL DATA:  Post CEA, history of TIA  EXAM: CT HEAD WITHOUT CONTRAST  TECHNIQUE: Contiguous axial images were obtained from the base of the skull through the vertex without intravenous contrast.  COMPARISON:  CT ANGIO NECK W/CM &/OR WO/CM dated 11/03/2013; CT HEAD W/O CM dated 11/01/2013; MR HEAD W/O CM dated 11/02/2013  FINDINGS: Scattered very  minimal periventricular hypodensities are grossly unchanged. Gray-white differentiation is otherwise well maintained without CT evidence of acute large territory infarct. No intraparenchymal or extra-axial mass or hemorrhage. Unchanged size and configuration of the ventricles and basilar cisterns. No midline shift. There is minimal mucosal thickening  within the left sphenoid sinus. The remaining paranasal sinuses and mastoid air cells are normally aerated. Regional soft tissues appear normal. No displaced calvarial fracture.  IMPRESSION: Mild microvascular ischemic disease without acute intracranial process.   Electronically Signed   By: Sandi Mariscal M.D.   On: 11/25/2013 11:00     Date: 11/25/2013  Rate: 92  Rhythm: normal sinus rhythm  QRS Axis: normal  Intervals: normal  ST/T Wave abnormalities: nonspecific ST changes  Conduction Disutrbances:none  Narrative Interpretation:   Old EKG Reviewed: unchanged from prior     MDM   Final diagnoses:  CVA (cerebral infarction)    Nursing notes including past medical history and social history reviewed and considered in documentation Labs/vital reviewed and considered Previous records reviewed and considered     Sharyon Cable, MD 11/25/13 1222

## 2013-11-25 NOTE — H&P (Signed)
[facility] History and Physical  [name] [id number] DOB: 13-Apr-1951 DOA: 11/25/2013  Referring physician: Dr. Christy Gentles PCP: Angelica Chessman, MD   Chief Complaint: left side numbness  HPI: Sonia Skinner is a 63 y.o. female with PMH significant for HTN, HLD, DM and recent admission due to left MCA infarct and left CEA. Return to ED after experiencing left side body numbness after awaking today. Patient reports good recovery on her motor and neurologic deficits from previous stroke and has been lately complaining just of mild headaches and left neck pain (in the area of CEA). Went to bed feeling normal and in the morning while washing her face felt some numbness on left side; symptoms progressed to affect the whole left side and patient called EMS. Denies fever, chills, CP, SOB, blurred vision, dysarthria or focal motor deficit. Patient had CT scan of her head w/o contrast that demonstrated not acute intracranial abnormality; neurology was consulted and [facility] has been called to admit for further evaluation and treatment of recurrent stroke vs TIA.   Review of Systems:  Negative except otherwise mentioned on HPI.  Past Medical History  Diagnosis Date  . Hypertension   . Thyroid disease   . Complication of anesthesia     ALLERY TO ESTER BASE  . Diabetes mellitus     INSULIN DEPENDENT  . GERD (gastroesophageal reflux disease)   . Carotid artery occlusion    Past Surgical History  Procedure Laterality Date  . Abdominal hysterectomy    . Endarterectomy Left 11/04/2013    Procedure: ENDARTERECTOMY CAROTID;  Surgeon: Angelia Mould, MD;  Location: [facility] OR;  Service: Vascular;  Laterality: Left;  . Carotid endarterectomy Left 11-04-13    cea   Social History:  reports that she quit smoking 10 days ago. Her smoking use included Cigarettes. She has a 4 pack-year smoking history. She has never used smokeless tobacco. She reports that she does not drink alcohol or use illicit  drugs.  Allergies  Allergen Reactions  . Anesthetics, Ester Anaphylaxis    Family History  Problem Relation Age of Onset  . Diabetes Mother   . Heart disease Mother     Before age 17  . Cancer Father     Lung  . Hypertension Sister   . Diabetes Sister   . Diabetes Sister   . Diabetes Sister      Prior to Admission medications   Medication Sig Start Date End Date Taking? Authorizing Provider  acetaminophen (TYLENOL) 325 MG tablet Take 650 mg by mouth every 6 (six) hours as needed for headache.   Yes Historical Provider, MD  aspirin 325 MG tablet Take 1 tablet (325 mg total) by mouth daily. 11/06/13  Yes Samella Parr, NP  atorvastatin (LIPITOR) 40 MG tablet Take 1 tablet (40 mg total) by mouth daily at 6 PM. 11/06/13  Yes Samella Parr, NP  buPROPion (WELLBUTRIN XL) 150 MG 24 hr tablet Take 300 mg by mouth 2 (two) times daily. 2 tabs in AM and 1 in PM 11/20/13  Yes Reyne Dumas, MD  docusate sodium 100 MG CAPS Take 100 mg by mouth daily. 11/06/13  Yes Samella Parr, NP  glipiZIDE (GLUCOTROL) 10 MG tablet Take 20 mg by mouth 2 (two) times daily before a meal.  10/19/13  Yes Lorayne Marek, MD  HYDROcodone-acetaminophen (NORCO/VICODIN) 5-325 MG per tablet Take 1-2 tablets by mouth every 6 (six) hours as needed for moderate pain. 11/21/13  Yes Sharmon Leyden Nickel, NP  insulin glargine (LANTUS) 100 UNIT/ML injection Inject 0.8 mLs (80 Units total) into the skin at bedtime. 07/18/13  Yes Angelica Chessman, MD  levothyroxine (SYNTHROID, LEVOTHROID) 50 MCG tablet Take 50 mcg by mouth daily before breakfast. 11/20/13  Yes Reyne Dumas, MD  lisinopril-hydrochlorothiazide (PRINZIDE,ZESTORETIC) 20-25 MG per tablet Take 1 tablet by mouth daily. 04/23/13  Yes Angelica Chessman, MD  nicotine (NICODERM CQ - DOSED IN MG/24 HOURS) 14 mg/24hr patch Place 1 patch (14 mg total) onto the skin daily. 11/06/13  Yes Samella Parr, NP  omeprazole (PRILOSEC) 40 MG capsule Take 40 mg by mouth daily.   Yes Historical  Provider, MD   Physical Exam: Filed Vitals:   11/25/13 1500  BP: 173/58  Pulse: 88  Temp: 98.6 F (37 C)  Resp: 18    BP 173/58  Pulse 88  Temp(Src) 98.6 F (37 C) (Oral)  Resp 18  Wt 66.225 kg (146 lb)  SpO2 100%  General:  Appears calm and comfortable; feeling better and currently reporting improvement/almost complete resolution of left side numbness Eyes: PERRL, normal lids, irises & conjunctiva, no nystagmus, no icterus ENT: grossly normal hearing, lips & tongue, no erythema or exudates inside her mouth; no drainage out of ears or nostrils Neck: no LAD, masses or thyromegaly, no JVD. Left scar from recent CEA pretty much healed Cardiovascular: RRR, no m/r/g. No LE edema. Telemetry: SR, no arrhythmias  Respiratory: CTA bilaterally, no w/r/r. Normal respiratory effort. Abdomen: soft, nt, nd; positive BS Musculoskeletal: grossly normal tone BUE/BLE, no joint swelling; FROM Psychiatric: grossly normal mood and affect, speech fluent and appropriate  Neurologic: Alert, oriented, thought content appropriate. Speech fluent without evidence of aphasia. Able to follow 3 step commands without difficulty. Grossly non-focal. Patient reports numbness sensation on left side otherwise intact          Labs on Admission:  Basic Metabolic Panel:  Recent Labs Lab 11/25/13 1120  NA 143  K 4.5  CL 101  CO2 26  GLUCOSE 125*  BUN 8  CREATININE 0.89  CALCIUM 9.7   Liver Function Tests:  Recent Labs Lab 11/25/13 1120  AST 22  ALT 16  ALKPHOS 155*  BILITOT 0.3  PROT 7.5  ALBUMIN 4.0   CBC:  Recent Labs Lab 11/25/13 1120  WBC 10.1  NEUTROABS 5.2  HGB 12.7  HCT 38.1  MCV 83.0  PLT 243   CBG:  Recent Labs Lab 11/25/13 1237  GLUCAP 90    Radiological Exams on Admission: Ct Head Wo Contrast  11/25/2013   CLINICAL DATA:  Post CEA, history of TIA  EXAM: CT HEAD WITHOUT CONTRAST  TECHNIQUE: Contiguous axial images were obtained from the base of the skull through  the vertex without intravenous contrast.  COMPARISON:  CT ANGIO NECK W/CM &/OR WO/CM dated 11/03/2013; CT HEAD W/O CM dated 11/01/2013; MR HEAD W/O CM dated 11/02/2013  FINDINGS: Scattered very minimal periventricular hypodensities are grossly unchanged. Gray-white differentiation is otherwise well maintained without CT evidence of acute large territory infarct. No intraparenchymal or extra-axial mass or hemorrhage. Unchanged size and configuration of the ventricles and basilar cisterns. No midline shift. There is minimal mucosal thickening within the left sphenoid sinus. The remaining paranasal sinuses and mastoid air cells are normally aerated. Regional soft tissues appear normal. No displaced calvarial fracture.  IMPRESSION: Mild microvascular ischemic disease without acute intracranial process.   Electronically Signed   By: Sandi Mariscal M.D.   On: 11/25/2013 11:00    EKG:  No acute  ischemic changes appreciated. Sinus rhythm.  Assessment/Plan 1-Left sided numbness: in a patient with recent left MCA CVA and carotid stenosis status post CEA. Presented > 4 hours away from symptoms presentation (out of therapeutic window). Risk factor includes HTN, HLD, DM and prior CVA. Patient on full dose ASA prior to admission for secondary prevention -will admit for observation, neuro-telemetry bed -will hold on repeating full wok up as just 3 weeks ago has everything done -CT head w/o contrast r/o out any acute abnormality -will get MRI/MRA and consult neurology -will keep on ASA for now, but might required to be changed to plavix given TIA/recurrent stroke while on ASA. Will follow neurology/stroke team recommendations on this. -continue statins and treatment to modify other risk factors. -will check Mg level, TSH and B12  2-Essential hypertension, benign: stable. Will continue current medication regimen. Permissive with HTN in setting of acute ischemic event  3-Type II or unspecified type diabetes mellitus:  patient has not eaten anything today and was mildly hypoglycemic. -has now passed swallowing eval and will start modified carb diet -start SSI and continue lantus (last one just 30 units and to be given in am) -holding PO hypoglycemic agents -A1C 10.5 (on 3/20)  4-Unspecified hypothyroidism: continue synthroid. Will check TSH  5-Other and unspecified hyperlipidemia: continue statins  6-Acute ischemic left MCA stroke: as mentioned above. Currently with just left side numbness. -continue risk factors modifications -repeat MRI/MRA -continue ASA for now -follow neurology rec's  7-Occlusion and stenosis of carotid artery with cerebral infarction: status post CEA; healing properly. Continue statins and ASA. -might benefit of plavix instead  8-Depression: continue Wellbutrin    Neurology (Dr. Doy Mince)  Code Status: Full Family Communication: sisters at bedside Disposition Plan: observation, LOS < 2 midnights, neuro-tele bed  Time spent: 58 minutes  [facility] Pager [phone]

## 2013-11-25 NOTE — Consult Note (Addendum)
Referring Physician: [place]    Chief Complaint: Left sided numbness  HPI: Sonia Skinner is a LH 63 y.o. female s/p recent left MCA infarct 11/01/13 and left CEA on 11/04/13 who has been doing well at home with only some complaints of left neck pain and numbness who reports that she went to bed early because she had a full day yesterday.  She awakened at about 11PM and was dizzy but was able to eat and went back to bed.  When she awakened this morning and started to wash her face she noted that the left side of her face was numb.  She went on to church and started to feel even worse.  Became dizzy and nauseous and felt numb on the entire left side.  Patient called EMS at that time.    Patient hospitalized at [facility] on 3/19.  Work up at that time revealed an elevated A1c and LDL.  There was critical left ICA stenosis by CTA.  Right carotid revealed a plaque at the bifurcation.  Vertebrals were patent.  Echocardiogram showed no intracardiac masses or thrombi.  Patient was discharged on an aspirin a day.   Initial NIHSS of 1.  Date last known well: Date: 11/24/2013 Time last known well: Time: 20:00 tPA Given: No: Recent infarct, out of time window  Past Medical History  Diagnosis Date  . Hypertension   . Thyroid disease   . Complication of anesthesia     ALLERY TO ESTER BASE  . Diabetes mellitus     INSULIN DEPENDENT  . GERD (gastroesophageal reflux disease)   . Carotid artery occlusion     Past Surgical History  Procedure Laterality Date  . Abdominal hysterectomy    . Endarterectomy Left 11/04/2013    Procedure: ENDARTERECTOMY CAROTID;  Surgeon: Angelia Mould, MD;  Location: [facility] OR;  Service: Vascular;  Laterality: Left;  . Carotid endarterectomy Left 11-04-13    cea    Family History  Problem Relation Age of Onset  . Diabetes Mother   . Heart disease Mother     Before age 59  . Cancer Father     Lung  . Hypertension Sister   . Diabetes Sister   . Diabetes Sister   . Diabetes  Sister    Social History:  reports that she quit smoking 10 days ago. Her smoking use included Cigarettes. She has a 4 pack-year smoking history. She has never used smokeless tobacco. She reports that she does not drink alcohol or use illicit drugs.  Allergies:  Allergies  Allergen Reactions  . Anesthetics, Ester Anaphylaxis    Medications: I have reviewed the patient's current medications. Prior to Admission:  Current outpatient prescriptions: acetaminophen (TYLENOL) 325 MG tablet, Take 650 mg by mouth every 6 (six) hours as needed for headache., Disp: , Rfl: ;  aspirin 325 MG tablet, Take 1 tablet (325 mg total) by mouth daily., Disp: , Rfl: ;   atorvastatin (LIPITOR) 40 MG tablet, Take 1 tablet (40 mg total) by mouth daily at 6 PM., Disp: 30 tablet, Rfl: 0 buPROPion (WELLBUTRIN XL) 150 MG 24 hr tablet, Take 300 mg by mouth 2 (two) times daily. 2 tabs in AM and 1 in PM, Disp: , docusate sodium 100 MG CAPS, Take 100 mg by mouth daily., Disp: 10 capsule, Rfl: 0;   glipiZIDE (GLUCOTROL) 10 MG tablet, Take 20 mg by mouth 2 (two) times daily before a meal. , Disp: , Rfl:  HYDROcodone-acetaminophen (NORCO/VICODIN) 5-325 MG per tablet,  Take 1-2 tablets by mouth every 6 (six) hours as needed for moderate pain., Disp: 30 tablet, Rfl: 0;   insulin glargine (LANTUS) 100 UNIT/ML injection, Inject 0.8 mLs (80 Units total) into the skin at bedtime., Disp: 80 mL, Rfl: 3;  levothyroxine (SYNTHROID, LEVOTHROID) 50 MCG tablet, Take 50 mcg by mouth daily before breakfast., Disp: , Rfl:  lisinopril-hydrochlorothiazide (PRINZIDE,ZESTORETIC) 20-25 MG per tablet, Take 1 tablet by mouth daily., Disp: 90 tablet, Rfl: 3;  nicotine (NICODERM CQ - DOSED IN MG/24 HOURS) 14 mg/24hr patch, Place 1 patch (14 mg total) onto the skin daily., Disp: 28 patch, Rfl: 0;   omeprazole (PRILOSEC) 40 MG capsule, Take 40 mg by mouth daily., Disp: , Rfl:   ROS: History obtained from the patient  General ROS: negative for - chills,  fatigue, fever, night sweats, weight gain or weight loss Psychological ROS: negative for - behavioral disorder, hallucinations, memory difficulties, mood swings or suicidal ideation Ophthalmic ROS: negative for - blurry vision, double vision, eye pain or loss of vision ENT ROS: negative for - epistaxis, nasal discharge, oral lesions, sore throat, tinnitus Allergy and Immunology ROS: negative for - hives or itchy/watery eyes Hematological and Lymphatic ROS: negative for - bleeding problems, bruising or swollen lymph nodes Endocrine ROS: negative for - galactorrhea, hair pattern changes, polydipsia/polyuria or temperature intolerance Respiratory ROS: negative for - cough, hemoptysis, shortness of breath or wheezing Cardiovascular ROS: negative for - chest pain, dyspnea on exertion, edema or irregular heartbeat Gastrointestinal ROS: nausea Genito-Urinary ROS: negative for - dysuria, hematuria, incontinence or urinary frequency/urgency Musculoskeletal ROS: negative for - joint swelling or muscular weakness Neurological ROS: as noted in HPI Dermatological ROS: negative for rash and skin lesion changes  Physical Examination: Blood pressure 178/57, pulse 85, temperature 98.4 F (36.9 C), temperature source Oral, resp. rate 13, weight 66.225 kg (146 lb), SpO2 98.00%.  Neurologic Examination: Mental Status: Alert, oriented, thought content appropriate.  Speech fluent without evidence of aphasia.  Able to follow 3 step commands without difficulty. Cranial Nerves: II: Discs flat bilaterally; Visual fields grossly normal, pupils equal, round, reactive to light and accommodation III,IV, VI: ptosis not present, extra-ocular motions intact bilaterally V,VII: smile symmetric, facial light touch sensation decreased on the left not splitting the midline VIII: hearing normal bilaterally IX,X: gag reflex present XI: bilateral shoulder shrug XII: midline tongue extension Motor: Right : Upper extremity    5/5    Left:     Upper extremity   5/5  Lower extremity   5/5     Lower extremity   5/5 Tone and bulk:normal tone throughout; no atrophy noted Sensory: Pinprick and light touch decreased on the left Deep Tendon Reflexes: 2+ and symmetric with absent AJ's bilaterally Plantars: Right: mute   Left: mute Cerebellar: normal finger-to-nose and normal heel-to-shin test Gait: Unable to test CV: pulses palpable throughout    Laboratory Studies:  Basic Metabolic Panel:  Recent Labs Lab 11/25/13 1120  NA 143  K 4.5  CL 101  CO2 26  GLUCOSE 125*  BUN 8  CREATININE 0.89  CALCIUM 9.7    Liver Function Tests:  Recent Labs Lab 11/25/13 1120  AST 22  ALT 16  ALKPHOS 155*  BILITOT 0.3  PROT 7.5  ALBUMIN 4.0   No results found for this basename: LIPASE, AMYLASE,  in the last 168 hours No results found for this basename: AMMONIA,  in the last 168 hours  CBC:  Recent Labs Lab 11/25/13 1120  WBC 10.1  NEUTROABS  5.2  HGB 12.7  HCT 38.1  MCV 83.0  PLT 243    Cardiac Enzymes: No results found for this basename: CKTOTAL, CKMB, CKMBINDEX, TROPONINI,  in the last 168 hours  BNP: No components found with this basename: POCBNP,   CBG:  Recent Labs Lab 11/25/13 1237  GLUCAP 90    Microbiology: Results for orders placed during the hospital encounter of 11/01/13  SURGICAL PCR SCREEN     Status: None   Collection Time    11/04/13  6:10 AM      Result Value Ref Range Status   MRSA, PCR NEGATIVE  NEGATIVE Final   Staphylococcus aureus NEGATIVE  NEGATIVE Final   Comment:            The Xpert SA Assay (FDA     approved for NASAL specimens     in patients over 30 years of age),     is one component of     a comprehensive surveillance     program.  Test performance has     been validated by  [employer] for patients greater     than or equal to 4 year old.     It is not intended     to diagnose infection nor to     guide or monitor treatment.    Coagulation  Studies:  Recent Labs  11/25/13 1120  LABPROT 13.1  INR 1.01    Urinalysis:  Recent Labs Lab 11/25/13 1157  COLORURINE YELLOW  LABSPEC 1.004*  PHURINE 6.0  GLUCOSEU NEGATIVE  HGBUR NEGATIVE  BILIRUBINUR NEGATIVE  KETONESUR NEGATIVE  PROTEINUR NEGATIVE  UROBILINOGEN 0.2  NITRITE NEGATIVE  LEUKOCYTESUR NEGATIVE    Lipid Panel:    Component Value Date/Time   CHOL 221* 11/02/2013 0225   TRIG 190* 11/02/2013 0225   HDL 36* 11/02/2013 0225   CHOLHDL 6.1 11/02/2013 0225   VLDL 38 11/02/2013 0225   LDLCALC 147* 11/02/2013 0225    HgbA1C:  Lab Results  Component Value Date   HGBA1C 10.5* 11/02/2013    Urine Drug Screen:     Component Value Date/Time   LABOPIA NONE DETECTED 11/25/2013 1157   COCAINSCRNUR NONE DETECTED 11/25/2013 1157   LABBENZ NONE DETECTED 11/25/2013 1157   AMPHETMU NONE DETECTED 11/25/2013 1157   THCU NONE DETECTED 11/25/2013 1157   LABBARB NONE DETECTED 11/25/2013 1157    Alcohol Level:  Recent Labs Lab 11/25/13 1120  ETH <11    Other results: EKG: sinus rhythm at 92 bpm.  Imaging: Ct Head Wo Contrast  11/25/2013   CLINICAL DATA:  Post CEA, history of TIA  EXAM: CT HEAD WITHOUT CONTRAST  TECHNIQUE: Contiguous axial images were obtained from the base of the skull through the vertex without intravenous contrast.  COMPARISON:  CT ANGIO NECK W/CM &/OR WO/CM dated 11/03/2013; CT HEAD W/O CM dated 11/01/2013; MR HEAD W/O CM dated 11/02/2013  FINDINGS: Scattered very minimal periventricular hypodensities are grossly unchanged. Gray-white differentiation is otherwise well maintained without CT evidence of acute large territory infarct. No intraparenchymal or extra-axial mass or hemorrhage. Unchanged size and configuration of the ventricles and basilar cisterns. No midline shift. There is minimal mucosal thickening within the left sphenoid sinus. The remaining paranasal sinuses and mastoid air cells are normally aerated. Regional soft tissues appear normal. No  displaced calvarial fracture.  IMPRESSION: Mild microvascular ischemic disease without acute intracranial process.   Electronically Signed   By: Sandi Mariscal M.D.   On: 11/25/2013  11:00    Assessment: 63 y.o. female with recent left MCA infarct s/p left CEA who presents with new onset left sided numbness.  Patient has had recent stroke work up.  She has multiple risk factors that place her at risk for small vessel disease.  Nonocclusive plaque on the left was noted on the CTA 3 weeks ago as well.  Head CT reviewed and shows no acute changes.  Suspect recurrent infarct. Patient on ASA at home.  Further work up recommended.   Stroke work up does not need to be repeated at this time.  Patient not a tPA candidate due to recent infarct.    Stroke Risk Factors - diabetes mellitus, hyperlipidemia and hypertension  Plan: 1. MRI, MRA  of the brain without contrast 2. PT consult, OT consult, Speech consult 3. Prophylactic therapy-Antiplatelet med: Aspirin - dose 325mg  daily 4. Telemetry monitoring 5. Frequent neuro checks   Alexis Goodell, MD [facility] [phone] 11/25/2013, 1:32 PM

## 2013-11-25 NOTE — ED Notes (Signed)
Attempted to Call Report x1.

## 2013-11-25 NOTE — Progress Notes (Signed)
Hypoglycemic Event  CBG: 44  Treatment: 15 GM carbohydrate snack  Symptoms: Shaky and Hungry  Follow-up CBG: Time:1605 CBG Result:65  Possible Reasons for Event: Inadequate meal intake  Comments/MD notified:RN called to room. Pt felt shaky like when her sugar is low. Pt has not ate all day. Dr. Hinton Lovely Dalbert Garnet  Remember to initiate Hypoglycemia Order Set & complete

## 2013-11-25 NOTE — ED Notes (Signed)
Pt. Returned to room from MRI.

## 2013-11-25 NOTE — ED Notes (Signed)
Unable to do Neuro Check because patient is in MRI.

## 2013-11-25 NOTE — ED Notes (Signed)
Ida at the bedside getting patient to go to the bathroom. Dr. Christy Gentles at the bedside speaking with patient and family.

## 2013-11-25 NOTE — ED Notes (Signed)
Pt returned from radiology. Registration at bedside.

## 2013-11-25 NOTE — ED Notes (Signed)
Per EMS, Patient complains of Left Sided Facial Numbness. Patient has a history of stroke, three weeks ago. Per EMS, Stroke scale was negative and patient denies any pain. Patient had a blockage of the left carotid three weeks ago and they did the surgery to remove it. Patient complaining of nausea upon arrival. Vitals per EMS: 168/70,  76 HR, CBG 153, 18 RR. History of Diabetes Mellitus, Hypertension, and CVA.

## 2013-11-25 NOTE — ED Notes (Signed)
Pt returned from CT °

## 2013-11-26 ENCOUNTER — Ambulatory Visit: Payer: No Typology Code available for payment source | Admitting: Home Health Services

## 2013-11-26 ENCOUNTER — Telehealth: Payer: Self-pay | Admitting: Neurology

## 2013-11-26 DIAGNOSIS — I6350 Cerebral infarction due to unspecified occlusion or stenosis of unspecified cerebral artery: Secondary | ICD-10-CM

## 2013-11-26 LAB — GLUCOSE, CAPILLARY
Glucose-Capillary: 145 mg/dL — ABNORMAL HIGH (ref 70–99)
Glucose-Capillary: 210 mg/dL — ABNORMAL HIGH (ref 70–99)

## 2013-11-26 LAB — VITAMIN B12: Vitamin B-12: 381 pg/mL (ref 211–911)

## 2013-11-26 NOTE — Progress Notes (Signed)
Patient wants to know when she can be d/c. MD paged.

## 2013-11-26 NOTE — Progress Notes (Signed)
UR completed 

## 2013-11-26 NOTE — Telephone Encounter (Signed)
Dr. Wynelle Cleveland called saying Dr. Leonie Man wanted to see patient in 30months for stroke follow up from hospital.  Patient is scheduled for 02-21-14 with [facility], is it necessary to see patient sooner?

## 2013-11-26 NOTE — Progress Notes (Signed)
D/C summary will not print, saying the med rec is not completed. MD paged to notify.

## 2013-11-26 NOTE — Evaluation (Signed)
Speech Language Pathology Evaluation Patient Details Name: Sonia Skinner MRN: [id number] DOB: 08/06/1951 Today's Date: 11/26/2013 Time: [id number] SLP Time Calculation (min): 15 min  Problem List:  Patient Active Problem List   Diagnosis Date Noted  . CVA (cerebral infarction) 11/25/2013  . Occlusion and stenosis of carotid artery with cerebral infarction 11/25/2013  . Left sided numbness 11/25/2013  . Depression 11/25/2013  . Aftercare following surgery of the circulatory system, [place] 11/21/2013  . Occlusion and stenosis of carotid artery without mention of cerebral infarction- Left 11/21/2013  . Follow-up examination, following unspecified surgery 11/21/2013  . Carotid stenosis 11/03/2013  . Parotid mass 11/02/2013  . Acute ischemic left MCA stroke 11/02/2013  . Type II or unspecified type diabetes mellitus with neurological manifestations, not stated as uncontrolled 11/02/2013  . CVA (cerebral vascular accident) 11/01/2013  . TIA (transient ischemic attack) 11/01/2013  . Unspecified vitamin D deficiency 10/19/2013  . Tooth pain 07/31/2013  . Tobacco abuse 07/31/2013  . Essential hypertension, benign 04/20/2013  . Type II or unspecified type diabetes mellitus 04/20/2013  . Unspecified hypothyroidism 04/20/2013  . Other and unspecified hyperlipidemia 04/20/2013   Past Medical History:  Past Medical History  Diagnosis Date  . Hypertension   . Thyroid disease   . Complication of anesthesia     ALLERY TO ESTER BASE  . Diabetes mellitus     INSULIN DEPENDENT  . GERD (gastroesophageal reflux disease)   . Carotid artery occlusion    Past Surgical History:  Past Surgical History  Procedure Laterality Date  . Abdominal hysterectomy    . Endarterectomy Left 11/04/2013    Procedure: ENDARTERECTOMY CAROTID;  Surgeon: Angelia Mould, MD;  Location: [facility] OR;  Service: Vascular;  Laterality: Left;  . Carotid endarterectomy Left 11-04-13    cea   HPI:  63 y.o. female with PMH  significant for HTN, HLD, DM and recent admission due to left MCA infarct (3/ weeks ago) and left CEA. MRI no acute infarct detected.  Return to ED after experiencing left side body numbness after awaking today.    Assessment / Plan / Recommendation Clinical Impression  Pt. exhibited functional speech-language-cognitive abilities.  Pt. in agreement that no ST service are needed at this time.    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[place])   Cognition  Overall Cognitive Status: Within Functional Limits for tasks assessed Orientation Level: Oriented X4    Comprehension  Auditory Comprehension Overall Auditory Comprehension: Appears within functional limits for tasks assessed Visual Recognition/Discrimination Discrimination: Not tested    Expression Expression Primary Mode of Expression: Verbal Verbal Expression Overall Verbal Expression: Appears within functional limits for tasks assessed Written Expression Written Expression: Within Functional Limits   Oral / Motor Motor Speech Overall Motor Speech: Appears within functional limits for tasks assessed   GO     Orbie Pyo [employer] [phone]  11/26/2013

## 2013-11-26 NOTE — Discharge Summary (Signed)
Physician Discharge Summary  Sonia Skinner [id number] DOB: 1951/01/04 DOA: 11/25/2013  PCP: Angelica Chessman, MD  Admit date: 11/25/2013 Discharge date: 11/26/2013  Time spent: >45  minutes  Recommendations for Outpatient Follow-up:  1. F/u with Dr Leonie Man - office will call to give appt.   Discharge Diagnoses:  Principal Problem:   Left sided numbness Active Problems:   Essential hypertension, benign   Type II or unspecified type diabetes mellitus   Unspecified hypothyroidism   Other and unspecified hyperlipidemia   Acute ischemic left MCA stroke   CVA (cerebral infarction)   Occlusion and stenosis of carotid artery with cerebral infarction   Depression   Discharge Condition: stable  Diet recommendation: heart healthy  Filed Weights   11/25/13 1028  Weight: 66.225 kg (146 lb)    History of present illness:  Sonia Skinner is a 63 y.o. female with PMH significant for HTN, HLD, DM and recent admission due to left MCA infarct and left CEA. Return to ED after experiencing left side body numbness after awaking today. Patient reports good recovery on her motor and neurologic deficits from previous stroke and has been lately complaining just of mild headaches and left neck pain (in the area of CEA). Went to bed feeling normal and in the morning while washing her face felt some numbness on left side; symptoms progressed to affect the whole left side and patient called EMS. Denies fever, chills, CP, SOB, blurred vision, dysarthria or focal motor deficit.  Patient had CT scan of her head w/o contrast that demonstrated not acute intracranial abnormality; neurology was consulted.   Hospital Course:  An MRI, MRA was recommended by neurology with was performed and found to be negative for an acute infarct. Neuro advises to continue full ASA for secondary stroke prevention. Dr Tobey Grim note does not mention a cause for the symptoms.  - she will f/u with Dr Leonie Man in 2 months for her recent  CVA  Left parotid lesion - noted on MRI - see report below - pt has already followed up with ENT and was told there is no need for intervention at this tim.   Procedures:  none  Consultations:  Neuro  Discharge Exam: Filed Vitals:   11/26/13 1040  BP: 143/51  Pulse: 86  Temp: 98.2 F (36.8 C)  Resp: 20    General: AAO x 3, no distress Cardiovascular: RRR, no murmurs Respiratory: CTA b/l   Discharge Instructions You were cared for by a hospitalist during your hospital stay. If you have any questions about your discharge medications or the care you received while you were in the hospital after you are discharged, you can call the unit and asked to speak with the hospitalist on call if the hospitalist that took care of you is not available. Once you are discharged, your primary care physician will handle any further medical issues. Please note that NO REFILLS for any discharge medications will be authorized once you are discharged, as it is imperative that you return to your primary care physician (or establish a relationship with a primary care physician if you do not have one) for your aftercare needs so that they can reassess your need for medications and monitor your lab values.   Future Appointments Provider Department Dept Phone   11/27/2013 10:30 AM [place] [phone]   12/19/2013 9:00 AM Viann Fish, NP [facility] [phone]   01/02/2014 3:30 PM Lorayne Marek, MD  [place] [phone]   02/21/2014 3:15 PM Philmore Pali, NP [facility] [phone]   05/22/2014 9:30 AM [facility] [address] [id number]   05/22/2014 10:40 AM Sharmon Leyden Nickel, NP [facility] [phone]       Medication List    ASK your doctor about these medications       acetaminophen 325 MG tablet  Commonly known  as:  TYLENOL  Take 650 mg by mouth every 6 (six) hours as needed for headache.     aspirin 325 MG tablet  Take 1 tablet (325 mg total) by mouth daily.     atorvastatin 40 MG tablet  Commonly known as:  LIPITOR  Take 1 tablet (40 mg total) by mouth daily at 6 PM.     buPROPion 150 MG 24 hr tablet  Commonly known as:  WELLBUTRIN XL  Take 300 mg by mouth 2 (two) times daily. 2 tabs in AM and 1 in PM     DSS 100 MG Caps  Take 100 mg by mouth daily.     glipiZIDE 10 MG tablet  Commonly known as:  GLUCOTROL  Take 20 mg by mouth 2 (two) times daily before a meal.     HYDROcodone-acetaminophen 5-325 MG per tablet  Commonly known as:  NORCO/VICODIN  Take 1-2 tablets by mouth every 6 (six) hours as needed for moderate pain.     insulin glargine 100 UNIT/ML injection  Commonly known as:  LANTUS  Inject 0.8 mLs (80 Units total) into the skin at bedtime.     levothyroxine 50 MCG tablet  Commonly known as:  SYNTHROID, LEVOTHROID  Take 50 mcg by mouth daily before breakfast.     lisinopril-hydrochlorothiazide 20-25 MG per tablet  Commonly known as:  PRINZIDE,ZESTORETIC  Take 1 tablet by mouth daily.     nicotine 14 mg/24hr patch  Commonly known as:  NICODERM CQ - dosed in mg/24 hours  Place 1 patch (14 mg total) onto the skin daily.     omeprazole 40 MG capsule  Commonly known as:  PRILOSEC  Take 40 mg by mouth daily.       Allergies  Allergen Reactions  . Anesthetics, Ester Anaphylaxis      The results of significant diagnostics from this hospitalization (including imaging, microbiology, ancillary and laboratory) are listed below for reference.    Significant Diagnostic Studies: Dg Chest 2 View  11/01/2013   CLINICAL DATA:  Stroke.  Smoker.  EXAM: CHEST  2 VIEW  COMPARISON:  None.  FINDINGS: Lungs are clear. Heart size is normal. No pneumothorax or pleural effusion.  IMPRESSION: Negative chest.   Electronically Signed   By: Inge Rise M.D.   On: 11/01/2013 21:33    Ct Head Wo Contrast  11/25/2013   CLINICAL DATA:  Post CEA, history of TIA  EXAM: CT HEAD WITHOUT CONTRAST  TECHNIQUE: Contiguous axial images were obtained from the base of the skull through the vertex without intravenous contrast.  COMPARISON:  CT ANGIO NECK W/CM &/OR WO/CM dated 11/03/2013; CT HEAD W/O CM dated 11/01/2013; MR HEAD W/O CM dated 11/02/2013  FINDINGS: Scattered very minimal periventricular hypodensities are grossly unchanged. Gray-white differentiation is otherwise well maintained without CT evidence of acute large territory infarct. No intraparenchymal or extra-axial mass or hemorrhage. Unchanged size and configuration of the ventricles and basilar cisterns. No midline shift. There is minimal mucosal thickening within the left sphenoid sinus. The remaining  paranasal sinuses and mastoid air cells are normally aerated. Regional soft tissues appear normal. No displaced calvarial fracture.  IMPRESSION: Mild microvascular ischemic disease without acute intracranial process.   Electronically Signed   By: Sandi Mariscal M.D.   On: 11/25/2013 11:00   Ct Head Wo Contrast  11/01/2013   CLINICAL DATA:  Right-sided weakness for 2 days  EXAM: CT HEAD WITHOUT CONTRAST  TECHNIQUE: Contiguous axial images were obtained from the base of the skull through the vertex without intravenous contrast.  COMPARISON:  None.  FINDINGS: There is no evidence of mass effect, midline shift or extra-axial fluid collections. There is no evidence of a space-occupying lesion or intracranial hemorrhage. There is an area of low-attenuation at the gray-white junction in the left frontal lobe superiorly (image 22) which may reflect volume averaging versus a subacute infarct. There are no other areas of concerning for cortical infarction.  The ventricles and sulci are appropriate for the patient's age. The basal cisterns are patent.  Visualized portions of the orbits are unremarkable. There is mild sphenoid sinus mucosal thickening.   The osseous structures are unremarkable.  IMPRESSION: There is an area of low-attenuation at the gray-white junction in the left frontal lobe superiorly (image 22) which may reflect volume averaging versus a subacute infarct.   Electronically Signed   By: Kathreen Devoid   On: 11/01/2013 10:10   Ct Angio Neck W/cm &/or Wo/cm  11/03/2013   CLINICAL DATA:  Left MCA region infarctions. History of carotid stenoses.  EXAM: CT ANGIOGRAPHY NECK  TECHNIQUE: Multidetector CT imaging of the neck was performed using the standard protocol during bolus administration of intravenous contrast. Multiplanar CT image reconstructions and MIPs were obtained to evaluate the vascular anatomy. Carotid stenosis measurements (when applicable) are obtained utilizing NASCET criteria, using the distal internal carotid diameter as the denominator.  CONTRAST:  54mL OMNIPAQUE IOHEXOL 350 MG/ML SOLN  COMPARISON:  MRI 11/02/2013  FINDINGS: No focal lung lesions. No superior mediastinal mass. There is atherosclerosis of the aorta. Branching pattern of the brachiocephalic vessels from the arch is normal. Atherosclerotic disease at the left subclavian origin results in narrowing of 30%. No stenosis of the innominate or left common carotid artery origin.  Right common carotid artery is widely patent to the bifurcation region. There is atherosclerotic plaque at the carotid bifurcation but the minimal diameter of the ICA is 4 mm. Compared to a more distal cervical ICA diameter of 4 mm, there is no stenosis.  The left common carotid artery is widely patent to its distal extent. There is soft plaque at the distal common carotid artery with narrowing of the lumen from its expected diameter of 5 mm to a diameter of 2.5 mm just proximal to the bifurcation. This indicates a 50% stenosis. There is extensive primarily soft plaque affecting the proximal internal carotid artery with narrowing of the lumen to 1 mm or less. There may be an intraluminal filling  defect extending from this region. The more distal cervical ICA is narrowed because of reduced inflow. The stenosis at the proximal ICA is at least 80% and possibly a severe is 90%. The left ICA does show flow through the skullbase. There is stenosis of the left external carotid artery as well.  Both vertebral artery origins are patent. Left vertebral artery is dominant. There is mild narrowing of both proximal vertebral arteries, but stenosis is no more than 30%. No stenosis in the cervical region. Both vessels show flow to the basilar.  Review  of the MIP images confirms the above findings.  IMPRESSION: Non stenotic atherosclerotic plaque at the carotid bifurcation on the right.  Severe atherosclerotic disease at the carotid bifurcation region on the left. 50% stenosis of the common carotid artery just proximal to the bifurcation. Severe narrowing and irregularity of the internal carotid artery bulb with stenosis at least 80% and possibly greater. There may be some thrombus tailing from this region.  These results were called by telephone at the time of interpretation on 11/03/2013 at 4:32 PM to Dr. Shanon Brow TAT , who verbally acknowledged these results.   Electronically Signed   By: Nelson Chimes M.D.   On: 11/03/2013 16:32   Mr Jodene Nam Head Wo Contrast  11/25/2013   CLINICAL DATA:  New onset left-sided numbness. Post left carotid endarterectomy 11/04/2013. Diabetic hypertensive patient.  EXAM: MRI HEAD WITHOUT CONTRAST  MRA HEAD WITHOUT CONTRAST  TECHNIQUE: Multiplanar, multiecho pulse sequences of the brain and surrounding structures were obtained without intravenous contrast. Angiographic images of the head were obtained using MRA technique without contrast.  COMPARISON:  11/25/2013 CT. 11/02/2013 MR and MR angiogram. 11/03/2013 CT angiogram head and neck.  FINDINGS: MRI HEAD FINDINGS  Subacute left middle cerebral artery distribution infarcts (acute on 11/02/2013). T2 shine through at the level of these subacute  infarcts. One small area of potential restricted motion (series 3, image 25 and series 300, image 25) left motor strip where small acute infarct could not be absolutely excluded. However, I suspect this represents slow resolution of prior infarct seen in this region. Additionally, acute infarct at this level would not explain the patient's left-sided numbness.  Overall, no acute infarct detected.  Minimal blood breakdown products posterior left frontal -parietal lobe at the level of prior infarct (series 9, image 124). Otherwise no evidence of intracranial hemorrhage.  No intracranial mass lesion noted on this unenhanced exam.  Mild atrophy without hydrocephalus.  Left parotid region 1.7 cm lesion. This is without change. ENT consultation recommended.  Mild cervical spondylotic changes C3-4. Cerebellar tonsils minimally low lying but within the range of normal limits. The pituitary region, pineal region and orbital structures unremarkable.  Partial opacification aerated aspect upper left pterygoid plate. Minimal mucosal thickening left maxillary sinus.  MRA HEAD FINDINGS  Moderate to marked tandem stenosis of the internal carotid artery cavernous segment bilaterally.  Moderate to marked narrowing proximal left middle cerebral artery branch.  Left vertebral artery is dominant. Mild narrowing of the right vertebral artery after the takeoff of the right posterior inferior cerebellar artery.  No significant stenosis of the basilar artery.  Moderate narrowing involving portions of the posterior inferior cerebellar artery bilaterally.  Poor delineation left anterior inferior cerebellar artery.  Small caliber right superior cerebellar artery.  No aneurysm or vascular malformation noted.  IMPRESSION: MR brain:  Subacute left middle cerebral artery distribution infarcts with minimal amount associated hemorrhage as detailed above.  No acute infarct detected as cause of patient's left sided numbness.  Left parotid region 1.7  cm lesion without change. ENT consultation recommended.  MR angiogram:  Moderate to marked tandem stenosis of the internal carotid artery cavernous segment bilaterally.  Moderate to marked narrowing proximal left middle cerebral artery branch.  Moderate narrowing involving portions of the posterior inferior cerebellar artery bilaterally.  Poor delineation left anterior inferior cerebellar artery.  Small caliber right superior cerebellar artery.   Electronically Signed   By: Chauncey Cruel M.D.   On: 11/25/2013 15:08   Mr Brain Wo Contrast  11/25/2013  CLINICAL DATA:  New onset left-sided numbness. Post left carotid endarterectomy 11/04/2013. Diabetic hypertensive patient.  EXAM: MRI HEAD WITHOUT CONTRAST  MRA HEAD WITHOUT CONTRAST  TECHNIQUE: Multiplanar, multiecho pulse sequences of the brain and surrounding structures were obtained without intravenous contrast. Angiographic images of the head were obtained using MRA technique without contrast.  COMPARISON:  11/25/2013 CT. 11/02/2013 MR and MR angiogram. 11/03/2013 CT angiogram head and neck.  FINDINGS: MRI HEAD FINDINGS  Subacute left middle cerebral artery distribution infarcts (acute on 11/02/2013). T2 shine through at the level of these subacute infarcts. One small area of potential restricted motion (series 3, image 25 and series 300, image 25) left motor strip where small acute infarct could not be absolutely excluded. However, I suspect this represents slow resolution of prior infarct seen in this region. Additionally, acute infarct at this level would not explain the patient's left-sided numbness.  Overall, no acute infarct detected.  Minimal blood breakdown products posterior left frontal -parietal lobe at the level of prior infarct (series 9, image 124). Otherwise no evidence of intracranial hemorrhage.  No intracranial mass lesion noted on this unenhanced exam.  Mild atrophy without hydrocephalus.  Left parotid region 1.7 cm lesion. This is without  change. ENT consultation recommended.  Mild cervical spondylotic changes C3-4. Cerebellar tonsils minimally low lying but within the range of normal limits. The pituitary region, pineal region and orbital structures unremarkable.  Partial opacification aerated aspect upper left pterygoid plate. Minimal mucosal thickening left maxillary sinus.  MRA HEAD FINDINGS  Moderate to marked tandem stenosis of the internal carotid artery cavernous segment bilaterally.  Moderate to marked narrowing proximal left middle cerebral artery branch.  Left vertebral artery is dominant. Mild narrowing of the right vertebral artery after the takeoff of the right posterior inferior cerebellar artery.  No significant stenosis of the basilar artery.  Moderate narrowing involving portions of the posterior inferior cerebellar artery bilaterally.  Poor delineation left anterior inferior cerebellar artery.  Small caliber right superior cerebellar artery.  No aneurysm or vascular malformation noted.  IMPRESSION: MR brain:  Subacute left middle cerebral artery distribution infarcts with minimal amount associated hemorrhage as detailed above.  No acute infarct detected as cause of patient's left sided numbness.  Left parotid region 1.7 cm lesion without change. ENT consultation recommended.  MR angiogram:  Moderate to marked tandem stenosis of the internal carotid artery cavernous segment bilaterally.  Moderate to marked narrowing proximal left middle cerebral artery branch.  Moderate narrowing involving portions of the posterior inferior cerebellar artery bilaterally.  Poor delineation left anterior inferior cerebellar artery.  Small caliber right superior cerebellar artery.   Electronically Signed   By: Chauncey Cruel M.D.   On: 11/25/2013 15:08   Mr Brain Wo Contrast  11/02/2013   CLINICAL DATA:  63 year old female with hypertension, diabetes, visual disturbances on the left with right side weakness. Right upper extremity weakness. Initial  encounter.  EXAM: MRI HEAD WITHOUT CONTRAST  MRA HEAD WITHOUT CONTRAST  TECHNIQUE: Multiplanar, multiecho pulse sequences of the brain and surrounding structures were obtained without intravenous contrast. Angiographic images of the head were obtained using MRA technique without contrast.  COMPARISON:  Head CT without contrast 10/1913.  FINDINGS: MRI HEAD FINDINGS  Scattered mostly subcentimeter areas of restricted diffusion in the left MCA territory. Lesions involving from the left superior temporal gyrus (series 5, image 14), to the left periatrial white matter, to the sensory cortex and motor strip subcortical white matter (more confluent, up to 12 mm series  5, image 25). Associated mild T2 and FLAIR hyperintensity. No associated mass effect. No evidence of associated hemorrhage.  No right hemisphere or posterior fossa restricted diffusion. Major intracranial vascular flow voids are preserved. Outside of the affected areas, gray and white matter signal is within normal limits. No chronic infarcts are identified.  No ventriculomegaly. No midline shift, mass effect, or evidence of intracranial mass lesion. Negative pituitary, cervicomedullary junction visualized cervical spine. Visible internal auditory structures appear normal. Mastoids are clear.  Small left sphenoid sinus mucous retention cyst. Minor ethmoid sinus mucosal thickening. Visualized orbit soft tissues are within normal limits. Normal bone marrow signal. Visualized scalp soft tissues are within normal limits.  In the left parotid space there is a 17 mm nodule with subtle dark T2 signal (series 11, image 14, and mildly restricted diffusion (series 6, image 15).  MRA HEAD FINDINGS  Antegrade flow in the posterior circulation with dominant distal left vertebral artery. Normal PICA origins. Normal vertebrobasilar junction. No basilar stenosis. SCA and PCA origins are normal. Bilateral PCA branches are normal. Posterior communicating arteries are  diminutive or absent.  Antegrade flow in the right ICA siphon which is irregular and appears up to moderately stenosed in the cavernous segment (series 3, image 72). There is also a small laterally directed cavernous aneurysm or ulcerated plaque (series 3, image 72 long arrow). The supra clinoid right ICA is patent. The right ICA terminus is normally patent.  Decreased antegrade flow in the left ICA siphon from the distal cervical levels. Moderate to severe irregularity in the left cavernous segment might be the source of hemodynamically significant stenosis (series 305, image 5). Nonetheless, the left supra clinoid ICA and terminus remain patent.  Normal right MCA and ACA origins and branches. Anterior communicating artery is normal.  On the left there is decreased flow signal throughout the left MCA branches, but no major branch occlusion or focal stenosis identified. Left ACA flow signal appears to be more symmetric.  IMPRESSION: 1. Scattered and patchy acute left MCA infarcts. No associated mass effect or hemorrhage. 2. MRA appearance indicating hemodynamically significant stenosis of the left ICA. The causative lesion might be in the left cavernous segment, or alternatively could be upstream in the left neck. 3. Subsequently decreased flow signal at the left ICA terminus and in the left MCA branches, but no major circle of Willis branch occlusion. 4. There is also extensive atherosclerosis of the right ICA cavernous segment with at least moderate stenosis on that side. 5. Unexpected finding of 17 mm left parotid space nodule with suspicious MRI characteristics. 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 Recommend nonurgent ENT followup once the patient recovers from the acute events.  Study discussed by telephone with Dr. Shanon Brow Tat On 11/02/2013 at 10:58 .   Electronically Signed   By: Lars Pinks M.D.   On: 11/02/2013 11:10   Mr Jodene Nam Head/brain Wo Cm  11/02/2013   CLINICAL DATA:  63 year old female with  hypertension, diabetes, visual disturbances on the left with right side weakness. Right upper extremity weakness. Initial encounter.  EXAM: MRI HEAD WITHOUT CONTRAST  MRA HEAD WITHOUT CONTRAST  TECHNIQUE: Multiplanar, multiecho pulse sequences of the brain and surrounding structures were obtained without intravenous contrast. Angiographic images of the head were obtained using MRA technique without contrast.  COMPARISON:  Head CT without contrast 10/1913.  FINDINGS: MRI HEAD FINDINGS  Scattered mostly subcentimeter areas of restricted diffusion in the left MCA territory. Lesions involving from the left superior temporal gyrus (  series 5, image 14), to the left periatrial white matter, to the sensory cortex and motor strip subcortical white matter (more confluent, up to 12 mm series 5, image 25). Associated mild T2 and FLAIR hyperintensity. No associated mass effect. No evidence of associated hemorrhage.  No right hemisphere or posterior fossa restricted diffusion. Major intracranial vascular flow voids are preserved. Outside of the affected areas, gray and white matter signal is within normal limits. No chronic infarcts are identified.  No ventriculomegaly. No midline shift, mass effect, or evidence of intracranial mass lesion. Negative pituitary, cervicomedullary junction visualized cervical spine. Visible internal auditory structures appear normal. Mastoids are clear.  Small left sphenoid sinus mucous retention cyst. Minor ethmoid sinus mucosal thickening. Visualized orbit soft tissues are within normal limits. Normal bone marrow signal. Visualized scalp soft tissues are within normal limits.  In the left parotid space there is a 17 mm nodule with subtle dark T2 signal (series 11, image 14, and mildly restricted diffusion (series 6, image 15).  MRA HEAD FINDINGS  Antegrade flow in the posterior circulation with dominant distal left vertebral artery. Normal PICA origins. Normal vertebrobasilar junction. No basilar  stenosis. SCA and PCA origins are normal. Bilateral PCA branches are normal. Posterior communicating arteries are diminutive or absent.  Antegrade flow in the right ICA siphon which is irregular and appears up to moderately stenosed in the cavernous segment (series 3, image 72). There is also a small laterally directed cavernous aneurysm or ulcerated plaque (series 3, image 72 long arrow). The supra clinoid right ICA is patent. The right ICA terminus is normally patent.  Decreased antegrade flow in the left ICA siphon from the distal cervical levels. Moderate to severe irregularity in the left cavernous segment might be the source of hemodynamically significant stenosis (series 305, image 5). Nonetheless, the left supra clinoid ICA and terminus remain patent.  Normal right MCA and ACA origins and branches. Anterior communicating artery is normal.  On the left there is decreased flow signal throughout the left MCA branches, but no major branch occlusion or focal stenosis identified. Left ACA flow signal appears to be more symmetric.  IMPRESSION: 1. Scattered and patchy acute left MCA infarcts. No associated mass effect or hemorrhage. 2. MRA appearance indicating hemodynamically significant stenosis of the left ICA. The causative lesion might be in the left cavernous segment, or alternatively could be upstream in the left neck. 3. Subsequently decreased flow signal at the left ICA terminus and in the left MCA branches, but no major circle of Willis branch occlusion. 4. There is also extensive atherosclerosis of the right ICA cavernous segment with at least moderate stenosis on that side. 5. Unexpected finding of 17 mm left parotid space nodule with suspicious MRI characteristics. 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 Recommend nonurgent ENT followup once the patient recovers from the acute events.  Study discussed by telephone with Dr. Shanon Brow Tat On 11/02/2013 at 10:58 .   Electronically Signed   By: Lars Pinks M.D.   On: 11/02/2013 11:10    Microbiology: No results found for this or any previous visit (from the past 240 hour(s)).   Labs: Basic Metabolic Panel:  Recent Labs Lab 11/25/13 1120 11/25/13 1650  NA 143  --   K 4.5  --   CL 101  --   CO2 26  --   GLUCOSE 125*  --   BUN 8  --   CREATININE 0.89  --   CALCIUM 9.7  --  MG  --  1.4*   Liver Function Tests:  Recent Labs Lab 11/25/13 1120  AST 22  ALT 16  ALKPHOS 155*  BILITOT 0.3  PROT 7.5  ALBUMIN 4.0   No results found for this basename: LIPASE, AMYLASE,  in the last 168 hours No results found for this basename: AMMONIA,  in the last 168 hours CBC:  Recent Labs Lab 11/25/13 1120  WBC 10.1  NEUTROABS 5.2  HGB 12.7  HCT 38.1  MCV 83.0  PLT 243   Cardiac Enzymes: No results found for this basename: CKTOTAL, CKMB, CKMBINDEX, TROPONINI,  in the last 168 hours BNP: BNP (last 3 results) No results found for this basename: PROBNP,  in the last 8760 hours CBG:  Recent Labs Lab 11/25/13 1610 11/25/13 1722 11/25/13 2212 11/26/13 0701 11/26/13 1207  GLUCAP 65* 144* 168* 145* 210*       Signed:  Margeret Stachnik  [facility] 11/26/2013, 2:08 PM

## 2013-11-26 NOTE — Evaluation (Signed)
Physical Therapy Evaluation Patient Details Name: Sonia Skinner MRN: [id number] DOB: [birth year] Today's Date: 11/26/2013   History of Present Illness  pt presents with L facial and Ue numbness.    Clinical Impression  Pt demos independence with all mobility and states only deficits currently are diminished sensation in L side of face and L UE.  Pt has good support from sister at home.  No further PT needs at this time.  Will sign off.      Follow Up Recommendations No PT follow up;Supervision - Intermittent    Equipment Recommendations  None recommended by PT    Recommendations for Other Services       Precautions / Restrictions Precautions Precautions: None Restrictions Weight Bearing Restrictions: No      Mobility  Bed Mobility Overal bed mobility: Independent                Transfers Overall transfer level: Independent Equipment used: None                Ambulation/Gait Ambulation/Gait assistance: Modified independent (Device/Increase time) Ambulation Distance (Feet): 200 Feet Assistive device: None Gait Pattern/deviations: Step-through pattern;Decreased stride length     General Gait Details: pt moves a little slowly, but demos good safety and ability to perform head turns and negotiate around obstacles.    Stairs Stairs: Yes Stairs assistance: Modified independent (Device/Increase time) Stair Management: One rail Left Number of Stairs: 3 General stair comments: pt demos good use of rail and safety on stairs.    Wheelchair Mobility    Modified Rankin (Stroke Patients Only) Modified Rankin (Stroke Patients Only) Pre-Morbid Rankin Score: No symptoms Modified Rankin: No significant disability     Balance Overall balance assessment: Independent                                           Pertinent Vitals/Pain Denied pain.      Home Living Family/patient expects to be discharged to:: Private residence Living Arrangements:  Other relatives Available Help at Discharge: Family;Available PRN/intermittently Type of Home: House Home Access: Stairs to enter Entrance Stairs-Rails: Left Entrance Stairs-Number of Steps: 4 Home Layout: One level Home Equipment: None      Prior Function Level of Independence: Independent               Hand Dominance   Dominant Hand: Right    Extremity/Trunk Assessment   Upper Extremity Assessment:  (pt with diminished sensation, otherwise functional.  )           Lower Extremity Assessment: Overall WFL for tasks assessed      Cervical / Trunk Assessment: Normal  Communication   Communication: No difficulties  Cognition Arousal/Alertness: Awake/alert Behavior During Therapy: WFL for tasks assessed/performed Overall Cognitive Status: Within Functional Limits for tasks assessed                      General Comments      Exercises        Assessment/Plan    PT Assessment Patent does not need any further PT services  PT Diagnosis     PT Problem List    PT Treatment Interventions     PT Goals (Current goals can be found in the Care Plan section) Acute Rehab PT Goals PT Goal Formulation: No goals set, d/c therapy    Frequency  Barriers to discharge        Co-evaluation               End of Session   Activity Tolerance: Patient tolerated treatment well Patient left: in chair;with call bell/phone within reach Nurse Communication: Mobility status    Functional Assessment Tool Used: Clinical Judgement Functional Limitation: Mobility: Walking and moving around Mobility: Walking and Moving Around Current Status [id number] 0 percent impaired, limited or restricted Mobility: Walking and Moving Around Goal Status [phone]): 0 percent impaired, limited or restricted Mobility: Walking and Moving Around Discharge Status [phone]): 0 percent impaired, limited or restricted    Time: 0746-0802 PT Time Calculation (min): 16 min   Charges:    PT Evaluation $Initial PT Evaluation Tier I: 1 Procedure PT Treatments $Gait Training: 8-22 mins   PT G Codes:   Functional Assessment Tool Used: Clinical Judgement Functional Limitation: Mobility: Walking and moving around    [employer], [place] [phone] 11/26/2013, 8:10 AM

## 2013-11-26 NOTE — Evaluation (Signed)
Occupational Therapy Evaluation Patient Details Name: Sonia Skinner MRN: [id number] DOB: 31-Jan-1951 Today's Date: 11/26/2013    History of Present Illness pt presents with L facial and Ue numbness.  Patient with recent CVA 3 weeks ago.   Clinical Impression   Patient reports symptoms from recent event are rapidly improving.  She reports only facial numbness on left at this time.  Patient effectively using bilateral upper extremities for ADL tasks.  Patient able to safely and effectively ambulate around room and in hallway without assistance or device.  Patient able to safely step into and out of tub without assistance or equipment.  Patient eager to go home.  No follow up OT warranted at this time.  OT will sign off.      Follow Up Recommendations  No OT follow up    Equipment Recommendations  None recommended by OT    Recommendations for Other Services       Precautions / Restrictions Precautions Precautions: None Precaution Comments: CEA scar still tender, CEA 3 weeks ago Restrictions Weight Bearing Restrictions: No      Mobility Bed Mobility Overal bed mobility: Independent                Transfers Overall transfer level: Independent Equipment used: None                  Balance Overall balance assessment: Independent                                          ADL Overall ADL's : Independent                                       General ADL Comments: Able to transition sit to stand without assistance, reach to feet in sitting.  Uses both hands functionally.  Balance sufficient for ADL tasks.     Vision   Alignment/Gaze Preference: Within Defined Limits Ocular Range of Motion: Within Functional Limits Tracking/Visual Pursuits: Able to track stimulus in all quads without difficulty Saccades: Within functional limits           Perception Perception Perception Tested?: Yes Comments: WFL   Praxis Praxis Praxis  tested?: Within functional limits    Pertinent Vitals/Pain Denies pain     Hand Dominance Left   Extremity/Trunk Assessment Upper Extremity Assessment Upper Extremity Assessment: Overall WFL for tasks assessed (slight decrease in right wrist extension (IV dorsum of hand))   Lower Extremity Assessment Lower Extremity Assessment: Overall WFL for tasks assessed   Cervical / Trunk Assessment Cervical / Trunk Assessment: Normal   Communication Communication Communication: No difficulties   Cognition Arousal/Alertness: Awake/alert Behavior During Therapy: WFL for tasks assessed/performed;Flat affect Overall Cognitive Status: Within Functional Limits for tasks assessed                     General Comments       Exercises       Shoulder Instructions      Home Living Family/patient expects to be discharged to:: Private residence Living Arrangements: Other relatives Available Help at Discharge: Family;Available PRN/intermittently Type of Home: House Home Access: Stairs to enter [employer] of Steps: 4 Entrance Stairs-Rails: Left Home Layout: One level     Bathroom Shower/Tub: Teacher, early years/pre:  Standard     Home Equipment: Shower seat (has access to shower seat at mom's house, has not needed it)          Prior Functioning/Environment Level of Independence: Independent        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    OT Diagnosis:     OT Problem List:     OT Treatment/Interventions:      OT Goals(Current goals can be found in the care plan section) Acute Rehab OT Goals Patient Stated Goal: to return to work  OT Frequency:     Barriers to D/C:            Co-evaluation              End of Session    Activity Tolerance: Patient tolerated treatment well Patient left: in chair;with call bell/phone within reach   Time: 0816-0831 OT Time Calculation (min): 15  min Charges:  OT General Charges $OT Visit: 1 Procedure OT Evaluation $Initial OT Evaluation Tier I: 1 Procedure OT Treatments $Self Care/Home Management : 8-22 mins G-Codes:    Mariah Milling 2013/11/29, 8:41 AM

## 2013-11-26 NOTE — Progress Notes (Signed)
Stroke Team Progress Note  HISTORY Sonia Skinner is a LH 63 y.o. female s/p recent left MCA infarct 11/01/13 and left CEA on 11/04/13 who has been doing well at home with only some complaints of left neck pain and numbness who reports that she went to bed early because she had a full day yesterday. She awakened at about 11PM 11/24/2013 and was dizzy but was able to eat and went back to bed. When she awakened this morning and started to wash her face she noted that the left side of her face was numb. She went on to church and started to feel even worse. Became dizzy and nauseous and felt numb on the entire left side. Patient called EMS at that time.  Patient hospitalized at [facility] on 3/19. Work up at that time revealed an elevated A1c and LDL. There was critical left ICA stenosis by CTA. Right carotid revealed a plaque at the bifurcation. Vertebrals were patent. Echocardiogram showed no intracardiac masses or thrombi. Patient was discharged on an aspirin a day. Initial NIHSS of 1.   Patient was not administered TPA secondary to Recent infarct, out of time window. She was admitted for further evaluation and treatment.  SUBJECTIVE No family is at the bedside.  Overall she feels her condition is stable.   OBJECTIVE Most recent Vital Signs: Filed Vitals:   11/25/13 2200 11/26/13 0016 11/26/13 0200 11/26/13 0603  BP: 165/60 167/64 166/60 160/66  Pulse: 86 80 76 72  Temp: 98 F (36.7 C) 98.1 F (36.7 C) 97 F (36.1 C) 97.6 F (36.4 C)  TempSrc: Oral Oral Oral Oral  Resp: 18 18 18 20   Weight:      SpO2:  94% 94% 94%   CBG (last 3)   Recent Labs  11/25/13 1722 11/25/13 2212 11/26/13 0701  GLUCAP 144* 168* 145*    IV Fluid Intake:     MEDICATIONS  . aspirin  325 mg Oral Daily  . atorvastatin  40 mg Oral q1800  . buPROPion  300 mg Oral BID  . docusate sodium  100 mg Oral Daily  . enoxaparin (LOVENOX) injection  40 mg Subcutaneous Q24H  . lisinopril  20 mg Oral Daily   And  .  hydrochlorothiazide  25 mg Oral Daily  . insulin aspart  0-9 Units Subcutaneous TID WC  . insulin glargine  30 Units Subcutaneous Daily  . levothyroxine  50 mcg Oral QAC breakfast  . nicotine  14 mg Transdermal Daily  . pantoprazole  40 mg Oral Daily   PRN:  acetaminophen, senna-docusate  Diet:    heart healthy/carb modified liquids Activity:  OOB with assistance DVT Prophylaxis:  Lovenox 40 mg sq daily   CLINICALLY SIGNIFICANT STUDIES Basic Metabolic Panel:   Recent Labs Lab 11/25/13 1120 11/25/13 1650  NA 143  --   K 4.5  --   CL 101  --   CO2 26  --   GLUCOSE 125*  --   BUN 8  --   CREATININE 0.89  --   CALCIUM 9.7  --   MG  --  1.4*   Liver Function Tests:   Recent Labs Lab 11/25/13 1120  AST 22  ALT 16  ALKPHOS 155*  BILITOT 0.3  PROT 7.5  ALBUMIN 4.0   CBC:   Recent Labs Lab 11/25/13 1120  WBC 10.1  NEUTROABS 5.2  HGB 12.7  HCT 38.1  MCV 83.0  PLT 243   Coagulation:   Recent Labs Lab  11/25/13 1120  LABPROT 13.1  INR 1.01   Cardiac Enzymes: No results found for this basename: CKTOTAL, CKMB, CKMBINDEX, TROPONINI,  in the last 168 hours Urinalysis:   Recent Labs Lab 11/25/13 1157  COLORURINE YELLOW  LABSPEC 1.004*  PHURINE 6.0  GLUCOSEU NEGATIVE  HGBUR NEGATIVE  BILIRUBINUR NEGATIVE  KETONESUR NEGATIVE  PROTEINUR NEGATIVE  UROBILINOGEN 0.2  NITRITE NEGATIVE  LEUKOCYTESUR NEGATIVE   Lipid Panel    Component Value Date/Time   CHOL 221* 11/02/2013 0225   TRIG 190* 11/02/2013 0225   HDL 36* 11/02/2013 0225   CHOLHDL 6.1 11/02/2013 0225   VLDL 38 11/02/2013 0225   LDLCALC 147* 11/02/2013 0225   HgbA1C  Lab Results  Component Value Date   HGBA1C 10.5* 11/02/2013    Urine Drug Screen:     Component Value Date/Time   LABOPIA NONE DETECTED 11/25/2013 1157   COCAINSCRNUR NONE DETECTED 11/25/2013 1157   LABBENZ NONE DETECTED 11/25/2013 1157   AMPHETMU NONE DETECTED 11/25/2013 1157   THCU NONE DETECTED 11/25/2013 1157   LABBARB NONE  DETECTED 11/25/2013 1157    Alcohol Level:   Recent Labs Lab 11/25/13 1120  ETH <11    CT of the brain  11/25/2013    Mild microvascular ischemic disease without acute intracranial process.     MRI of the brain  11/25/2013     Subacute left middle cerebral artery distribution infarcts with minimal amount associated hemorrhage as detailed above.  No acute infarct detected as cause of patient's left sided numbness.  Left parotid region 1.7 cm lesion without change. ENT consultation recommended.    MRA of the brain  11/25/2013      Moderate to marked tandem stenosis of the internal carotid artery cavernous segment bilaterally.  Moderate to marked narrowing proximal left middle cerebral artery branch.  Moderate narrowing involving portions of the posterior inferior cerebellar artery bilaterally.  Poor delineation left anterior inferior cerebellar artery.  Small caliber right superior cerebellar artery.   EKG  normal sinus rhythm. For complete results please see formal report.   Therapy Recommendations no PT or OT  Physical Exam  General: The patient is alert and cooperative at the time of the examination.  Skin: No significant peripheral edema is noted.   Neurologic Exam  Mental status: The patient is oriented x 3.  Cranial nerves: Facial symmetry is present. Speech is normal, no aphasia or dysarthria is noted. Extraocular movements are full. Visual fields are full.  Motor: The patient has good strength in all 4 extremities.  Sensory examination: Soft touch sensation of the face is decreased, symmetric on the arms and legs.  Coordination: The patient has good finger-nose-finger and heel-to-shin bilaterally.  Gait and station: The gait was not tested.  Reflexes: Deep tendon reflexes are symmetric.     ASSESSMENT Sonia Skinner is a 63 y.o. female presenting with left sided numbness s/p recent L CEA following a recent L MCA Infarct. Imaging confirms no new acute infarct. Dx:   Left Numbness, etiology unclear. Do not feel associated with surgery. On aspirin 325 mg orally every day prior to admission. Now on aspirin 325 mg orally every day for secondary stroke prevention. Patient with resultant left arm and face numbness that has not completely resolved.   hypertension Hyperlipidemia, LDL 147, on lipitor 40 mg daily PTA, now on lipitor 40 mg daily, goal LDL < 70 for diabetics Diabetes, uncontrolled HgbA1c 10.4, goal < 7.0 Left parotid region 1.7 cm lesion  `  Hospital day # 1  TREATMENT/PLAN  Continue aspirin 325 mg orally every day for secondary stroke prevention.  Agree with OP ENT consult to address carotid lesion  No further stroke workup indicated  [place] for discharge from stroke standpoint Ongoing risk factor control by Primary Care Physician Stroke Service will sign off. Please call should any needs arise. Follow up with Dr. Leonie Man, [facility], in 2 months.  Burnetta Sabin, MSN, RN, ANVP-BC, AGPCNP-BC [facility] Pager: [phone] 11/26/2013 9:37 AM  I have personally obtained a history, examined the patient, evaluated imaging results, and formulated the assessment and plan of care. I agree with the above. Kathrynn Ducking    To contact Stroke Continuity provider, please refer to [email or web address] After hours, contact General Neurology

## 2013-11-27 ENCOUNTER — Ambulatory Visit (INDEPENDENT_AMBULATORY_CARE_PROVIDER_SITE_OTHER): Payer: No Typology Code available for payment source | Admitting: Home Health Services

## 2013-11-27 ENCOUNTER — Telehealth: Payer: Self-pay | Admitting: Emergency Medicine

## 2013-11-27 DIAGNOSIS — E119 Type 2 diabetes mellitus without complications: Secondary | ICD-10-CM

## 2013-11-27 MED ORDER — LEVOTHYROXINE SODIUM 125 MCG PO TABS
125.0000 ug | ORAL_TABLET | Freq: Every day | ORAL | Status: DC
Start: 2013-11-27 — End: 2014-12-02

## 2013-11-27 NOTE — Telephone Encounter (Signed)
Pt is on waitlist.  No availability at this time.

## 2013-11-27 NOTE — Telephone Encounter (Signed)
Message copied by Ricci Barker on Tue Nov 27, 2013  4:40 PM ------      Message from: Allyson Sabal MD, Ascencion Dike      Created: Fri Nov 23, 2013  3:32 PM       Notify patient of the patient's TSH is low. The patient can decrease the dose of levothyroxine to 125 mcg. This prescription was already provided. Patient will need repeat TSH, free T4, T3 in in 2 months  For hypothyroidism ------

## 2013-11-27 NOTE — Progress Notes (Signed)
DIABETES Pt came in to have a retinal scan per diabetic care.   Image was taken and submitted to [facility]-DR. Cathren Laine for reading.    Results will be available in 1-2 weeks.  Results will be given to PCP for review and to contact patient.  Vinnie Level

## 2013-11-27 NOTE — Telephone Encounter (Signed)
Pt made aware thyroid dose and instructed to decrease Levothyroxine to 125 mcg. Script ready for pt to pick up at pharmacy Scheduled repeat lab appt for thyroid testing

## 2013-11-29 ENCOUNTER — Emergency Department (HOSPITAL_COMMUNITY): Payer: No Typology Code available for payment source

## 2013-11-29 ENCOUNTER — Telehealth: Payer: Self-pay | Admitting: Internal Medicine

## 2013-11-29 ENCOUNTER — Encounter (HOSPITAL_COMMUNITY): Payer: Self-pay | Admitting: Emergency Medicine

## 2013-11-29 ENCOUNTER — Other Ambulatory Visit: Payer: Self-pay

## 2013-11-29 ENCOUNTER — Telehealth: Payer: Self-pay | Admitting: *Deleted

## 2013-11-29 ENCOUNTER — Emergency Department (HOSPITAL_COMMUNITY)
Admission: EM | Admit: 2013-11-29 | Discharge: 2013-11-29 | Disposition: A | Discharge status: Home / Self Care | Payer: No Typology Code available for payment source | Attending: Emergency Medicine | Admitting: Emergency Medicine

## 2013-11-29 ENCOUNTER — Emergency Department (HOSPITAL_COMMUNITY)
Admission: EM | Admit: 2013-11-29 | Discharge: 2013-11-29 | Disposition: A | Discharge status: Home / Self Care | Payer: No Typology Code available for payment source | Source: Home / Self Care | Attending: Family Medicine | Admitting: Family Medicine

## 2013-11-29 DIAGNOSIS — Z8673 Personal history of transient ischemic attack (TIA), and cerebral infarction without residual deficits: Secondary | ICD-10-CM | POA: Insufficient documentation

## 2013-11-29 DIAGNOSIS — E119 Type 2 diabetes mellitus without complications: Secondary | ICD-10-CM | POA: Insufficient documentation

## 2013-11-29 DIAGNOSIS — M79609 Pain in unspecified limb: Secondary | ICD-10-CM | POA: Insufficient documentation

## 2013-11-29 DIAGNOSIS — R5383 Other fatigue: Secondary | ICD-10-CM

## 2013-11-29 DIAGNOSIS — Z7982 Long term (current) use of aspirin: Secondary | ICD-10-CM | POA: Insufficient documentation

## 2013-11-29 DIAGNOSIS — M79602 Pain in left arm: Secondary | ICD-10-CM

## 2013-11-29 DIAGNOSIS — R29898 Other symptoms and signs involving the musculoskeletal system: Secondary | ICD-10-CM

## 2013-11-29 DIAGNOSIS — R209 Unspecified disturbances of skin sensation: Secondary | ICD-10-CM | POA: Insufficient documentation

## 2013-11-29 DIAGNOSIS — Z794 Long term (current) use of insulin: Secondary | ICD-10-CM | POA: Insufficient documentation

## 2013-11-29 DIAGNOSIS — R200 Anesthesia of skin: Secondary | ICD-10-CM

## 2013-11-29 DIAGNOSIS — I10 Essential (primary) hypertension: Secondary | ICD-10-CM | POA: Insufficient documentation

## 2013-11-29 DIAGNOSIS — K219 Gastro-esophageal reflux disease without esophagitis: Secondary | ICD-10-CM | POA: Insufficient documentation

## 2013-11-29 DIAGNOSIS — Z87891 Personal history of nicotine dependence: Secondary | ICD-10-CM | POA: Insufficient documentation

## 2013-11-29 DIAGNOSIS — M542 Cervicalgia: Secondary | ICD-10-CM | POA: Insufficient documentation

## 2013-11-29 DIAGNOSIS — Z79899 Other long term (current) drug therapy: Secondary | ICD-10-CM | POA: Insufficient documentation

## 2013-11-29 DIAGNOSIS — R5381 Other malaise: Secondary | ICD-10-CM | POA: Insufficient documentation

## 2013-11-29 DIAGNOSIS — E079 Disorder of thyroid, unspecified: Secondary | ICD-10-CM | POA: Insufficient documentation

## 2013-11-29 HISTORY — DX: Cerebral infarction, unspecified: I63.9

## 2013-11-29 LAB — CBC
HCT: 39.6 % (ref 36.0–46.0)
Hemoglobin: 13.1 g/dL (ref 12.0–15.0)
MCH: 27.5 pg (ref 26.0–34.0)
MCHC: 33.1 g/dL (ref 30.0–36.0)
MCV: 83 fL (ref 78.0–100.0)
Platelets: 232 10*3/uL (ref 150–400)
RBC: 4.77 MIL/uL (ref 3.87–5.11)
RDW: 14.2 % (ref 11.5–15.5)
WBC: 12.1 10*3/uL — ABNORMAL HIGH (ref 4.0–10.5)

## 2013-11-29 LAB — CBG MONITORING, ED: Glucose-Capillary: 189 mg/dL — ABNORMAL HIGH (ref 70–99)

## 2013-11-29 LAB — BASIC METABOLIC PANEL
BUN: 10 mg/dL (ref 6–23)
CO2: 27 mEq/L (ref 19–32)
Calcium: 9.9 mg/dL (ref 8.4–10.5)
Chloride: 97 mEq/L (ref 96–112)
Creatinine, Ser: 1.02 mg/dL (ref 0.50–1.10)
GFR calc Af Amer: 66 mL/min — ABNORMAL LOW (ref >90)
GFR calc non Af Amer: 57 mL/min — ABNORMAL LOW (ref >90)
Glucose, Bld: 198 mg/dL — ABNORMAL HIGH (ref 70–99)
Potassium: 3.7 mEq/L (ref 3.7–5.3)
Sodium: 141 mEq/L (ref 137–147)

## 2013-11-29 LAB — TROPONIN I: Troponin I: <0.3 ng/mL (ref <0.30)

## 2013-11-29 MED ORDER — HYDROMORPHONE HCL 2 MG/ML IJ SOLN: 1.0000 mg | Freq: Once | INTRAMUSCULAR | Status: DC

## 2013-11-29 MED ORDER — SODIUM CHLORIDE 0.9 % IV SOLN
INTRAVENOUS | Status: DC
  Administered 2013-11-29: 12:00:00 via INTRAVENOUS

## 2013-11-29 MED ORDER — HYDROMORPHONE HCL PF 1 MG/ML IJ SOLN
1.0000 mg | Freq: Once | INTRAMUSCULAR | Status: AC
  Administered 2013-11-29: 1 mg via INTRAVENOUS
  Filled 2013-11-29: qty 1

## 2013-11-29 MED ORDER — ONDANSETRON HCL 4 MG/2ML IJ SOLN
4.0000 mg | Freq: Once | INTRAMUSCULAR | Status: AC
  Administered 2013-11-29: 4 mg via INTRAVENOUS
  Filled 2013-11-29: qty 2

## 2013-11-29 NOTE — ED Provider Notes (Signed)
Date: 11/29/2013  Rate: 86  Rhythm: normal sinus rhythm  QRS Axis: normal  Intervals: normal  ST/T Wave abnormalities: nonspecific ST/T changes  Conduction Disutrbances:none  Narrative Interpretation:   Old EKG Reviewed: none available   Did cross over into MUSE    Mervin Kung, MD 11/29/13 [phone]

## 2013-11-29 NOTE — Telephone Encounter (Signed)
No

## 2013-11-29 NOTE — ED Notes (Signed)
Reports just released from hospital on Monday, was admitted on 3/19 for stroke.   Presents today c/o  Left arm weakness and pain.  On set yesterday evening.  States "seems to gradually be getting worse".  Denies chest pain and sob.

## 2013-11-29 NOTE — Telephone Encounter (Signed)
Pt called left a message with the administrative staff claiming that sh might be having a stroke. I called the pt back she stated that her arm was numb, tingling and was unable to lift her arm. I told her that she needed to have someone drive her to the ER immediately.

## 2013-11-29 NOTE — ED Notes (Signed)
Pt was sent from [facility] for further evaluation of L arm pain and numbness since last night. She was just discharged from [facility] on Monday after a stroke

## 2013-11-29 NOTE — ED Provider Notes (Signed)
CSN: [id number]     Arrival date & time 11/29/13  1114 History   First MD Initiated Contact with Patient 11/29/13 1122     Chief Complaint  Patient presents with  . Numbness     (Consider location/radiation/quality/duration/timing/severity/associated sxs/prior Treatment) The history is provided by the patient.   63 year old female presented to urgent care this morning with left upper [place] weakness and pain and bilateral facial numbness. Patient's had a facial numbness since the Sunday was admitted on Sunday for this had an MRI that was negative discharged home on Monday. Patient states that she's get new worse weakness in the left hand. Patient underwent a carotid endarterectomy following a stroke back in March. That involved her left middle cerebral artery. Patient is concerned about a new stroke.  Past Medical History  Diagnosis Date  . Hypertension   . Thyroid disease   . Complication of anesthesia     ALLERY TO ESTER BASE  . Diabetes mellitus     INSULIN DEPENDENT  . GERD (gastroesophageal reflux disease)   . Carotid artery occlusion   . Stroke    Past Surgical History  Procedure Laterality Date  . Abdominal hysterectomy    . Endarterectomy Left 11/04/2013    Procedure: ENDARTERECTOMY CAROTID;  Surgeon: Angelia Mould, MD;  Location: [facility] OR;  Service: Vascular;  Laterality: Left;  . Carotid endarterectomy Left 11-04-13    cea   Family History  Problem Relation Age of Onset  . Diabetes Mother   . Heart disease Mother     Before age 5  . Cancer Father     Lung  . Hypertension Sister   . Diabetes Sister   . Diabetes Sister   . Diabetes Sister    History  Substance Use Topics  . Smoking status: Former Smoker -- 0.20 packs/day for 20 years    Types: Cigarettes    Quit date: 11/15/2013  . Smokeless tobacco: Never Used  . Alcohol Use: No   OB History   Grav Para Term Preterm Abortions TAB SAB Ect Mult Living                 Review of Systems   Constitutional: Negative for fever.  HENT: Negative for congestion.   Eyes: Negative for visual disturbance.  Respiratory: Negative for shortness of breath.   Cardiovascular: Negative for chest pain.  Gastrointestinal: Negative for abdominal pain.  Musculoskeletal: Positive for neck pain. Negative for back pain.  Skin: Negative for rash.  Neurological: Positive for weakness and numbness.  Hematological: Does not bruise/bleed easily.  Psychiatric/Behavioral: Negative for confusion.      Allergies  Anesthetics, ester  Home Medications   Prior to Admission medications   Medication Sig Start Date End Date Taking? Authorizing Provider  aspirin 325 MG tablet Take 1 tablet (325 mg total) by mouth daily. 11/06/13  Yes Samella Parr, NP  atorvastatin (LIPITOR) 40 MG tablet Take 1 tablet (40 mg total) by mouth daily at 6 PM. 11/06/13  Yes Samella Parr, NP  buPROPion (WELLBUTRIN XL) 150 MG 24 hr tablet Take 150-300 mg by mouth 2 (two) times daily. 2 tabs in AM and 1 in PM 11/20/13  Yes Reyne Dumas, MD  docusate sodium 100 MG CAPS Take 100 mg by mouth daily. 11/06/13  Yes Samella Parr, NP  glipiZIDE (GLUCOTROL) 10 MG tablet Take 20 mg by mouth 2 (two) times daily before a meal.  10/19/13  Yes Lorayne Marek, MD  HYDROcodone-acetaminophen (NORCO/VICODIN) 5-325  MG per tablet Take 1-2 tablets by mouth every 6 (six) hours as needed for moderate pain. 11/21/13  Yes Suzanne L Nickel, NP  insulin glargine (LANTUS) 100 UNIT/ML injection Inject 40 Units into the skin 2 (two) times daily.   Yes Historical Provider, MD  levothyroxine (SYNTHROID, LEVOTHROID) 125 MCG tablet Take 1 tablet (125 mcg total) by mouth daily. 11/27/13  Yes Reyne Dumas, MD  lisinopril-hydrochlorothiazide (PRINZIDE,ZESTORETIC) 20-25 MG per tablet Take 1 tablet by mouth daily. 04/23/13  Yes Angelica Chessman, MD  nicotine (NICODERM CQ - DOSED IN MG/24 HOURS) 14 mg/24hr patch Place 1 patch (14 mg total) onto the skin daily. 11/06/13  Yes  Samella Parr, NP  omeprazole (PRILOSEC) 40 MG capsule Take 40 mg by mouth daily.   Yes Historical Provider, MD  acetaminophen (TYLENOL) 325 MG tablet Take 650 mg by mouth every 6 (six) hours as needed for headache.    Historical Provider, MD   BP 164/69  Pulse 68  Temp(Src) 98 F (36.7 C) (Oral)  Resp 12  Ht 5\' 2"  (1.575 m)  Wt 146 lb (66.225 kg)  BMI 26.70 kg/m2  SpO2 94% Physical Exam  Nursing note and vitals reviewed. Constitutional: She is oriented to person, place, and time. She appears well-developed and well-nourished. No distress.  HENT:  Head: Normocephalic and atraumatic.  Mouth/Throat: Oropharynx is clear and moist.  Eyes: Conjunctivae and EOM are normal. Pupils are equal, round, and reactive to light.  Neck: Normal range of motion.  Healing left sided carotid endarterectomy scar no evidence of infection.  Cardiovascular: Normal rate, regular rhythm and normal heart sounds.   Pulmonary/Chest: Effort normal and breath sounds normal. No respiratory distress.  Abdominal: Soft. Bowel sounds are normal. There is no tenderness.  Musculoskeletal: Normal range of motion. She exhibits no edema.  Neurological: She is alert and oriented to person, place, and time. No cranial nerve deficit. She exhibits normal muscle tone. Coordination normal.  Neuro exam without any significant weakness or numbness.  Skin: Skin is warm. No rash noted.    ED Course  Procedures (including critical care time) Labs Review Labs Reviewed  CBC - Abnormal; Notable for the following:    WBC 12.1 (*)    All other components within normal limits  BASIC METABOLIC PANEL - Abnormal; Notable for the following:    Glucose, Bld 198 (*)    GFR calc non Af Amer 57 (*)    GFR calc Af Amer 66 (*)    All other components within normal limits  CBG MONITORING, ED - Abnormal; Notable for the following:    Glucose-Capillary 189 (*)    All other components within normal limits  TROPONIN I   Results for orders  placed during the hospital encounter of 11/29/13  CBC      Result Value Ref Range   WBC 12.1 (*) 4.0 - 10.5 K/uL   RBC 4.77  3.87 - 5.11 MIL/uL   Hemoglobin 13.1  12.0 - 15.0 g/dL   HCT 39.6  36.0 - 46.0 %   MCV 83.0  78.0 - 100.0 fL   MCH 27.5  26.0 - 34.0 pg   MCHC 33.1  30.0 - 36.0 g/dL   RDW 14.2  11.5 - 15.5 %   Platelets 232  150 - 400 K/uL  BASIC METABOLIC PANEL      Result Value Ref Range   Sodium 141  137 - 147 mEq/L   Potassium 3.7  3.7 - 5.3 mEq/L   Chloride  97  96 - 112 mEq/L   CO2 27  19 - 32 mEq/L   Glucose, Bld 198 (*) 70 - 99 mg/dL   BUN 10  6 - 23 mg/dL   Creatinine, Ser 1.02  0.50 - 1.10 mg/dL   Calcium 9.9  8.4 - 10.5 mg/dL   GFR calc non Af Amer 57 (*) >90 mL/min   GFR calc Af Amer 66 (*) >90 mL/min  TROPONIN I      Result Value Ref Range   Troponin I <0.30  <0.30 ng/mL  CBG MONITORING, ED      Result Value Ref Range   Glucose-Capillary 189 (*) 70 - 99 mg/dL     Imaging Review Mr Brain Wo Contrast  11/29/2013   CLINICAL DATA:  Recent stroke. Recent left carotid endarterectomy. Left-sided pain and weakness.  EXAM: MRI HEAD WITHOUT CONTRAST  TECHNIQUE: Multiplanar, multiecho pulse sequences of the brain and surrounding structures were obtained without intravenous contrast.  COMPARISON:  11/25/2013.  11/02/2013.  FINDINGS: Diffusion imaging does not show any acute or subacute infarction. Minimal residual restricted diffusion is present as some of the late subacute left MCA or watershed distribution infarctions that were acute on 11/02/2013. No swelling, mass effect or hemorrhage. The brainstem and cerebellum are normal. The cerebral hemispheres are otherwise normal. No chronic small vessel disease pattern. No neoplastic mass lesion, hemorrhage, hydrocephalus or extra-axial collection. No pituitary mass. No sinus disease. No definite visualization of a parotid lesion on today's exam.  IMPRESSION: No new infarction. Late subacute infarctions in the left hemisphere  either MCA distribution or watershed distribution. Some of these show T2 shine through but there are no true acute lesions.   Electronically Signed   By: Nelson Chimes M.D.   On: 11/29/2013 14:48     EKG Interpretation None      MDM   Final diagnoses:  Numbness  Left arm pain   She presented with concern for recurrent stroke. Patient had a stroke back in March 1 underwent a left carotid endarterectomy. Patient was admitted just on Sunday for numbness had MRI that was negative and discharged home on Monday no evidence of acute stroke. Patient was sent down from urgent care for left arm pain and numbness started last night. Workup here MRI negative for any acute stroke. Mild leukocytosis no significant electrolyte abnormalities no significant anemia. Troponin was negative. Patient to followup with her primary care Dr. begin cautious about what to return that would be suggestive of significant stroke.  In further discussion with the patient she's had some upper trimming the weakness in both hands following the stroke in March and was scheduled for rehabilitation. Not clear that what occurred yesterday that there is any significant left hand new weakness. Arm pain may be related to surgical scarring in the neck. Taking the nerves going to the arm. Followup with her vascular surgeon for repeat evaluation would be important. Patient has pain medication at home.    Mervin Kung, MD 11/29/13 1600

## 2013-11-29 NOTE — ED Notes (Signed)
Dr. Zackowski at bedside  

## 2013-11-29 NOTE — Discharge Instructions (Signed)
Patient without any evidence of a recurrent or new stroke since previous stroke or since the last hospitalization. Make appointment to followup with your regular doctor. Return for any newer worse symptoms like severe headache significantly new weakness other than involving your arms.

## 2013-11-29 NOTE — Telephone Encounter (Signed)
Pt called in today under distress about some numbness in her hands; pt was told to go directly to the ER from a nurse; pt was worried about payment for the hospital visit and wanted to speak with a nurse directly; patient's request was forwarded to a nurse and she was called back; pt ws then instructed to go directly to the ER;

## 2013-11-29 NOTE — ED Provider Notes (Signed)
Sonia Skinner is a 63 y.o. female who presents to Urgent Care today for left upper extremity weakness. Patient recently suffered a stroke involving the left middle cerebral artery. She had a current left carotid endarterectomy. She developed left hand weakness starting yesterday. She notes difficulty holding a pen and hand writing. She also notes left arm dull aching pain. Symptoms have persisted since yesterday. She called her primary care office he recommended she go directly to the emergency room. She presented to [facility] urgent care. She says that her current symptoms are different than her prior stroke which involved the right side of her body.  She was seen recently in the emergency room for left upper extremity numbness however this is a different sensation than she had then. She is left-hand dominant   Past Medical History  Diagnosis Date  . Hypertension   . Thyroid disease   . Complication of anesthesia     ALLERY TO ESTER BASE  . Diabetes mellitus     INSULIN DEPENDENT  . GERD (gastroesophageal reflux disease)   . Carotid artery occlusion    History  Substance Use Topics  . Smoking status: Former Smoker -- 0.20 packs/day for 20 years    Types: Cigarettes    Quit date: 11/15/2013  . Smokeless tobacco: Never Used  . Alcohol Use: No   ROS as above Medications: No current facility-administered medications for this encounter.   Current Outpatient Prescriptions  Medication Sig Dispense Refill  . aspirin 325 MG tablet Take 1 tablet (325 mg total) by mouth daily.      Marland Kitchen atorvastatin (LIPITOR) 40 MG tablet Take 1 tablet (40 mg total) by mouth daily at 6 PM.  30 tablet  0  . buPROPion (WELLBUTRIN XL) 150 MG 24 hr tablet Take 300 mg by mouth 2 (two) times daily. 2 tabs in AM and 1 in PM      . glipiZIDE (GLUCOTROL) 10 MG tablet Take 20 mg by mouth 2 (two) times daily before a meal.       . insulin glargine (LANTUS) 100 UNIT/ML injection Inject 0.8 mLs (80 Units total) into the skin at  bedtime.  80 mL  3  . levothyroxine (SYNTHROID, LEVOTHROID) 125 MCG tablet Take 1 tablet (125 mcg total) by mouth daily.  90 tablet  3  . levothyroxine (SYNTHROID, LEVOTHROID) 50 MCG tablet Take 50 mcg by mouth daily before breakfast.      . lisinopril-hydrochlorothiazide (PRINZIDE,ZESTORETIC) 20-25 MG per tablet Take 1 tablet by mouth daily.  90 tablet  3  . nicotine (NICODERM CQ - DOSED IN MG/24 HOURS) 14 mg/24hr patch Place 1 patch (14 mg total) onto the skin daily.  28 patch  0  . omeprazole (PRILOSEC) 40 MG capsule Take 40 mg by mouth daily.      Marland Kitchen acetaminophen (TYLENOL) 325 MG tablet Take 650 mg by mouth every 6 (six) hours as needed for headache.      . docusate sodium 100 MG CAPS Take 100 mg by mouth daily.  10 capsule  0  . HYDROcodone-acetaminophen (NORCO/VICODIN) 5-325 MG per tablet Take 1-2 tablets by mouth every 6 (six) hours as needed for moderate pain.  30 tablet  0    Exam:  BP 190/81  Pulse 80  Temp(Src) 98.4 F (36.9 C) (Oral)  Resp 16  SpO2 98% Gen: Well NAD HEENT: EOMI,  MMM well-appearing left carotid endarterectomy scar. Lungs: Normal work of breathing. CTABL Heart: RRR no MRG Abd: NABS, Soft. NT,  ND Exts: Brisk capillary refill, warm and well perfused.  Bilateral upper extremity strength is intact throughout Reflexes are equal bilateral upper extremity.  Capillary refill sensation and pulses are intact throughout. Neck: Full neck range of motion. Negative Spurling's test.   Assessment and Plan: 63 y.o. female with left upper extremity weakness following left brain stroke.  Patient has a subjective weakness. She describes difficulty holding a pen. Her current symptoms do not correlate with exacerbation of a left brain stroke. Her symptoms should involve the right side of her body.  She has new subjective weakness. Differential includes new right brain stroke, cervical nerve root impingement, or arterial impairment secondary to recent left carotid  endarterectomy. Plan to transfer to the emergency room for evaluation and management. Patient is outside stroke window.    Discussed warning signs or symptoms. Please see discharge instructions. Patient expresses understanding.    Gregor Hams, MD 11/29/13 [phone]

## 2013-12-03 NOTE — Telephone Encounter (Signed)
Left message that the doctor said it was ok for her follow up on 02-21-14.  She is on a waitlist if any appointments open up sooner.

## 2013-12-04 ENCOUNTER — Ambulatory Visit: Payer: No Typology Code available for payment source | Admitting: Occupational Therapy

## 2013-12-04 ENCOUNTER — Ambulatory Visit: Payer: No Typology Code available for payment source | Admitting: Physical Therapy

## 2013-12-05 ENCOUNTER — Telehealth: Payer: Self-pay

## 2013-12-05 ENCOUNTER — Other Ambulatory Visit: Payer: Self-pay

## 2013-12-05 ENCOUNTER — Other Ambulatory Visit: Payer: Self-pay | Admitting: Internal Medicine

## 2013-12-05 MED ORDER — BUPROPION HCL 100 MG PO TABS: ORAL_TABLET | ORAL | Status: DC

## 2013-12-05 MED ORDER — BUPROPION HCL ER (XL) 150 MG PO TB24: 150.0000 mg | ORAL_TABLET | Freq: Two times a day (BID) | ORAL | Status: DC

## 2013-12-05 NOTE — Telephone Encounter (Signed)
Patient called about her wellbutrin patient states she  Was running out Of her medications too fast- she was taking the medications two in the am and 1 in the pm Of wellbutrin XL Spoke with Dr Doyle Askew and she wrote a new prescription for wellbutrin 100mg  tab Patient is to take 3 tablets in the am for a total of 300mg  and one and one half tablet in the pm for total Of 150mg  at night [facility]

## 2013-12-18 ENCOUNTER — Encounter: Payer: Self-pay | Admitting: Family

## 2013-12-19 ENCOUNTER — Ambulatory Visit: Payer: No Typology Code available for payment source | Admitting: Family

## 2013-12-19 ENCOUNTER — Other Ambulatory Visit: Payer: Self-pay | Admitting: *Deleted

## 2013-12-19 ENCOUNTER — Ambulatory Visit (INDEPENDENT_AMBULATORY_CARE_PROVIDER_SITE_OTHER): Payer: Self-pay | Admitting: Family

## 2013-12-19 ENCOUNTER — Ambulatory Visit (HOSPITAL_COMMUNITY)
Admission: RE | Admit: 2013-12-19 | Discharge: 2013-12-19 | Disposition: A | Discharge status: Home / Self Care | Payer: No Typology Code available for payment source | Source: Ambulatory Visit | Attending: Family | Admitting: Family

## 2013-12-19 ENCOUNTER — Encounter: Payer: Self-pay | Admitting: Family

## 2013-12-19 ENCOUNTER — Encounter: Payer: Self-pay | Admitting: *Deleted

## 2013-12-19 VITALS — BP 159/75 | HR 79 | Resp 16 | Ht 62.0 in | Wt 143.0 lb

## 2013-12-19 DIAGNOSIS — Z48812 Encounter for surgical aftercare following surgery on the circulatory system: Secondary | ICD-10-CM

## 2013-12-19 DIAGNOSIS — R29898 Other symptoms and signs involving the musculoskeletal system: Secondary | ICD-10-CM | POA: Insufficient documentation

## 2013-12-19 DIAGNOSIS — G459 Transient cerebral ischemic attack, unspecified: Secondary | ICD-10-CM

## 2013-12-19 DIAGNOSIS — L910 Hypertrophic scar: Secondary | ICD-10-CM | POA: Insufficient documentation

## 2013-12-19 DIAGNOSIS — I6529 Occlusion and stenosis of unspecified carotid artery: Secondary | ICD-10-CM

## 2013-12-19 DIAGNOSIS — M5382 Other specified dorsopathies, cervical region: Secondary | ICD-10-CM

## 2013-12-19 DIAGNOSIS — M79609 Pain in unspecified limb: Secondary | ICD-10-CM

## 2013-12-19 NOTE — Patient Instructions (Signed)

## 2013-12-19 NOTE — Progress Notes (Signed)
Established Carotid Patient   History of Present Illness  Sonia Skinner is a 63 y.o. female who is s/p left CEA, with a stenosis of 95%, on 11/04/2013 by Dr. Scot Dock.  She returns today for 6 weeks post op check.  3 weeks post op she was nauseated, left facial numbness, seen at [facility], pt states MRI did not show a TIA. A week later the left facial numbness went back to baseline, the nausea resolved by the next morning. She has occasional tingling and numbness in both arms, reports rare dizziness if she rotates her head quickly.  Patient has Positive history of TIA or stroke symptom on 11/01/2013 as manifested by right hemiparesis, decreased vision in left eye, denies expressive aphasia, denies uniltaeral facial drooping.  She finished physical therapy, is left hand dominant, still has some right arm loss of dexterity, denies right leg weakness.  The right side of her body is slightly more sensitive than the left. She is not rotating her head much, appears slightly stiff necked, states still some pulling feeling at left side of neck. Patient denies New Medical or Surgical History.  Left CEA  incision is less painful, her neck feels stiff, at times, feels a pulling sensation; she is concerned about the keloid scar at left CEA incision. Has July 9 appointment with a neurologist.  Pt Diabetic: Yes, states she is working on better control  Pt smoker: former smoker, quit March, 2015  Pt meds include:  Statin : Yes  ASA: Yes  Other anticoagulants/antiplatelets: no   Past Medical History  Diagnosis Date  . Hypertension   . Thyroid disease   . Complication of anesthesia     ALLERY TO ESTER BASE  . Diabetes mellitus     INSULIN DEPENDENT  . GERD (gastroesophageal reflux disease)   . Carotid artery occlusion   . Stroke     Social History History  Substance Use Topics  . Smoking status: Former Smoker -- 0.20 packs/day for 20 years    Types: Cigarettes    Quit date: 11/15/2013  . Smokeless  tobacco: Never Used  . Alcohol Use: No    Family History Family History  Problem Relation Age of Onset  . Diabetes Mother   . Heart disease Mother     Before age 52  . Cancer Father     Lung  . Hypertension Sister   . Diabetes Sister   . Diabetes Sister   . Diabetes Sister     Surgical History Past Surgical History  Procedure Laterality Date  . Abdominal hysterectomy    . Endarterectomy Left 11/04/2013    Procedure: ENDARTERECTOMY CAROTID;  Surgeon: Angelia Mould, MD;  Location: [facility] OR;  Service: Vascular;  Laterality: Left;  . Carotid endarterectomy Left 11-04-13    cea    Allergies  Allergen Reactions  . Anesthetics, Ester Anaphylaxis    Current Outpatient Prescriptions  Medication Sig Dispense Refill  . acetaminophen (TYLENOL) 325 MG tablet Take 650 mg by mouth every 6 (six) hours as needed for headache.      Marland Kitchen aspirin 325 MG tablet Take 1 tablet (325 mg total) by mouth daily.      Marland Kitchen atorvastatin (LIPITOR) 40 MG tablet Take 1 tablet (40 mg total) by mouth daily at 6 PM.  30 tablet  0  . buPROPion (WELLBUTRIN) 100 MG tablet Take 3 tablets in the morning (total 300 mg) and take one a half tablet in the evening (total 150 mg)  140 tablet  1  . docusate sodium 100 MG CAPS Take 100 mg by mouth daily.  10 capsule  0  . glipiZIDE (GLUCOTROL) 10 MG tablet Take 20 mg by mouth 2 (two) times daily before a meal.       . HYDROcodone-acetaminophen (NORCO/VICODIN) 5-325 MG per tablet Take 1-2 tablets by mouth every 6 (six) hours as needed for moderate pain.  30 tablet  0  . insulin glargine (LANTUS) 100 UNIT/ML injection Inject 40 Units into the skin 2 (two) times daily.      Marland Kitchen levothyroxine (SYNTHROID, LEVOTHROID) 125 MCG tablet Take 1 tablet (125 mcg total) by mouth daily.  90 tablet  3  . lisinopril-hydrochlorothiazide (PRINZIDE,ZESTORETIC) 20-25 MG per tablet Take 1 tablet by mouth daily.  90 tablet  3  . nicotine (NICODERM CQ - DOSED IN MG/24 HOURS) 14 mg/24hr patch Place  1 patch (14 mg total) onto the skin daily.  28 patch  0  . omeprazole (PRILOSEC) 40 MG capsule Take 40 mg by mouth daily.       No current facility-administered medications for this visit.    Review of Systems : See HPI for pertinent positives and negatives.  Physical Examination  Filed Vitals:   12/19/13 0912 12/19/13 0915  BP: 153/74 159/75  Pulse: 78 79  Resp:  16  Height:  5\' 2"  (1.575 m)  Weight:  143 lb (64.864 kg)  SpO2:  100%  Body mass index is 26.15 kg/(m^2).   General: WDWN female in NAD  GAIT: normal  Eyes: PERRLA  Pulmonary: Non-labored, CTAB, Negative Rales, Negative rhonchi, & Negative wheezing.  Cardiac: regular Rhythm , Negative detected murmur.  VASCULAR EXAM  Carotid Bruits  Left  Right    Negative  Positive   Radial pulses are 2+ palpable and equal.  LE Pulses  LEFT  RIGHT   POPLITEAL  not palpable  not palpable   POSTERIOR TIBIAL  not palpable  not palpable   DORSALIS PEDIS  ANTERIOR TIBIAL  palpable  palpable    Gastrointestinal: soft, nontender, BS WNL, no r/g, negative masses.  Musculoskeletal: Negative muscle atrophy/wasting. M/S 5/5 throughout, Extremities without ischemic changes.  Neurologic: A&O X 3; Appropriate Affect ; SENSATION ; Equal bilaterally except for mild numbness along left CEA incision and left jaw line.  Speech is normal  CN 2-12 intact, Pain and light touch intact in extremities, Motor exam as listed above.   Previous angiogram: CTA of neck on 11/03/2013:  Right common carotid artery is widely patent to the bifurcation  region. There is atherosclerotic plaque at the carotid bifurcation  but the minimal diameter of the ICA is 4 mm. Compared to a more  distal cervical ICA diameter of 4 mm, there is no stenosis.    Non-Invasive Vascular Imaging CAROTID DUPLEX 12/19/2013   CEREBROVASCULAR DUPLEX EVALUATION    INDICATION: Follow-up carotid disease     PREVIOUS INTERVENTION(S): Left carotid endarterectomy 11/04/2013     DUPLEX EXAM:     RIGHT  LEFT  Peak Systolic Velocities (cm/s) End Diastolic Velocities (cm/s) Plaque LOCATION Peak Systolic Velocities (cm/s) End Diastolic Velocities (cm/s) Plaque  141 19  CCA PROXIMAL 132 19   108 18  CCA MID 78 17   94 18  CCA DISTAL 79 13   200 15  ECA 125 6   78 16  ICA PROXIMAL 41 12   86 30  ICA MID 126 42 HM  114 35  ICA DISTAL 121 39     .83  ICA / CCA Ratio (PSV) NA  Antegrade  Vertebral Flow Antegrade   0000000 Brachial Systolic Pressure (mmHg) 123456  Within normal limits  Brachial Artery Waveforms Within normal limits     Plaque Morphology:  HM = Homogeneous, HT = Heterogeneous, CP = Calcific Plaque, SP = Smooth Plaque, IP = Irregular Plaque     ADDITIONAL FINDINGS:     IMPRESSION: 1. Evidence of minimal (<40%) stenosis of the right internal carotid artery. 2. Widely patent left carotid endarterectomy with evidence of minimal hyperplasia observed at the distal patch site where slight tortuosity is also noted. 3. Bilateral vertebral artery is antegrade.    Compared to the previous exam:  No previous exam at this facility for comparison.       Assessment: Sonia Skinner is a 63 y.o. female who is s/p left CEA, with a stenosis of 95%, on 11/04/2013 by Dr. Scot Dock.  She returns today for 6 weeks post op check.  3 weeks post op she was nauseated, left facial numbness, seen at [facility], pt states MRI did not show a TIA. A week later the left facial numbness went back to baseline, the nausea resolved by the next morning. She has occasional tingling and numbness in both arms, reports rare dizziness if she rotates her head quickly.  Patient has Positive history of TIA or stroke symptom on 11/01/2013 as manifested by right hemiparesis, decreased vision in left eye, denies expressive aphasia, denies uniltaeral facial drooping.   Carotid Duplex added today after discussing with Dr. Scot Dock. Evidence of minimal (<40%) stenosis of the right internal carotid artery. Widely  patent left carotid endarterectomy with evidence of minimal hyperplasia observed at the distal patch site where slight tortuosity is also noted. Bilateral vertebral artery is antegrade. Discussed the above results with Dr. Scot Dock who spoke with the pt; the above result is not c/w left side of body having less sensation than the right. She is scheduled to see a neurologist on July 9.  Plan: Follow-up in 6 months with Carotid Duplex scan and see Dr. Scot Dock.  May use Neosporin on the left well healed CEA incision site to help decrease the appearance of the keloid scar. ROM exercises for neck so that she has better head rotation for driving. Work note to return to work next week at pt request.  I discussed in depth with the patient the nature of atherosclerosis, and emphasized the importance of maximal medical management including strict control of blood pressure, blood glucose, and lipid levels, obtaining regular exercise, and continued cessation of smoking.  The patient is aware that without maximal medical management the underlying atherosclerotic disease process will progress, limiting the benefit of any interventions. The patient was given information about stroke prevention and what symptoms should prompt the patient to seek immediate medical care. Thank you for allowing [place] to participate in this patient's care.  Clemon Chambers, RN, MSN, FNP-C [facility] Office: [phone]  Clinic Physician: Scot Dock  12/19/2013 9:14 AM

## 2013-12-21 ENCOUNTER — Other Ambulatory Visit (HOSPITAL_COMMUNITY): Payer: No Typology Code available for payment source

## 2013-12-21 ENCOUNTER — Ambulatory Visit: Payer: No Typology Code available for payment source | Admitting: Family

## 2014-01-02 ENCOUNTER — Ambulatory Visit: Payer: No Typology Code available for payment source | Admitting: Internal Medicine

## 2014-01-04 ENCOUNTER — Other Ambulatory Visit: Payer: Self-pay | Admitting: Internal Medicine

## 2014-01-09 ENCOUNTER — Encounter: Payer: Self-pay | Admitting: Internal Medicine

## 2014-01-09 ENCOUNTER — Ambulatory Visit: Payer: No Typology Code available for payment source | Attending: Internal Medicine | Admitting: Internal Medicine

## 2014-01-09 VITALS — BP 159/78 | HR 83 | Temp 98.7°F | Resp 16 | Ht 62.0 in | Wt 147.0 lb

## 2014-01-09 DIAGNOSIS — E119 Type 2 diabetes mellitus without complications: Secondary | ICD-10-CM

## 2014-01-09 DIAGNOSIS — K219 Gastro-esophageal reflux disease without esophagitis: Secondary | ICD-10-CM | POA: Insufficient documentation

## 2014-01-09 DIAGNOSIS — Z79899 Other long term (current) drug therapy: Secondary | ICD-10-CM | POA: Insufficient documentation

## 2014-01-09 DIAGNOSIS — Z8673 Personal history of transient ischemic attack (TIA), and cerebral infarction without residual deficits: Secondary | ICD-10-CM | POA: Insufficient documentation

## 2014-01-09 DIAGNOSIS — I10 Essential (primary) hypertension: Secondary | ICD-10-CM

## 2014-01-09 DIAGNOSIS — Z794 Long term (current) use of insulin: Secondary | ICD-10-CM | POA: Insufficient documentation

## 2014-01-09 DIAGNOSIS — Z87891 Personal history of nicotine dependence: Secondary | ICD-10-CM | POA: Insufficient documentation

## 2014-01-09 LAB — GLUCOSE, POCT (MANUAL RESULT ENTRY): POC Glucose: 221 mg/dl — AB (ref 70–99)

## 2014-01-09 MED ORDER — INSULIN ASPART 100 UNIT/ML ~~LOC~~ SOLN: SUBCUTANEOUS | Status: DC

## 2014-01-09 MED ORDER — AMLODIPINE BESYLATE 5 MG PO TABS: 5.0000 mg | ORAL_TABLET | Freq: Every day | ORAL | Status: DC

## 2014-01-09 NOTE — Progress Notes (Signed)
Pt is here following up on her HTN and diabetes. Pt has a surgical incision on her left side of her neck that she feels is healing ok but its irritated around the incision.

## 2014-01-10 ENCOUNTER — Other Ambulatory Visit: Payer: Self-pay | Admitting: Internal Medicine

## 2014-01-10 DIAGNOSIS — K219 Gastro-esophageal reflux disease without esophagitis: Secondary | ICD-10-CM

## 2014-01-10 NOTE — Progress Notes (Signed)
Patient ID: Sonia Skinner, female   DOB: 11/20/1950, 63 y.o.   MRN: [id number]  CC: f/u DM and HTN  HPI: Patient reports that she checks her blood sugar three times per day.  Patient's meter shows reading ranging from 113-350.  Patient reports that she takes 60 units of lantus in the morning and 20 units of lantus in the evening.  She reports that she has been compliant with all medication.  Patient's blood pressure readings in machine show a systolic range of [id number] and a diastolic range of [id number].  She is currentlt taking lisinopril-HCTZ 20-25 mg QD.  She reports good diet control and salt avoidance. Patient reports that she has increased night time hunger and reports that she was on Byetta a few years ago which helped control her hunger. Patient reports that she did well with Lantus and Byetta a few years ago and had great glycemic control.   Allergies  Allergen Reactions  . Anesthetics, Ester Anaphylaxis   Past Medical History  Diagnosis Date  . Hypertension   . Thyroid disease   . Complication of anesthesia     ALLERY TO ESTER BASE  . Diabetes mellitus     INSULIN DEPENDENT  . GERD (gastroesophageal reflux disease)   . Carotid artery occlusion   . Stroke    Current Outpatient Prescriptions on File Prior to Visit  Medication Sig Dispense Refill  . aspirin 325 MG tablet Take 1 tablet (325 mg total) by mouth daily.      Marland Kitchen atorvastatin (LIPITOR) 40 MG tablet Take 1 tablet (40 mg total) by mouth daily at 6 PM.  30 tablet  0  . buPROPion (WELLBUTRIN) 100 MG tablet Take 3 tablets in the morning (total 300 mg) and take one a half tablet in the evening (total 150 mg)  140 tablet  1  . docusate sodium 100 MG CAPS Take 100 mg by mouth daily.  10 capsule  0  . glipiZIDE (GLUCOTROL) 10 MG tablet Take 20 mg by mouth 2 (two) times daily before a meal.       . insulin glargine (LANTUS) 100 UNIT/ML injection Inject 40 Units into the skin 2 (two) times daily.      Marland Kitchen levothyroxine (SYNTHROID,  LEVOTHROID) 125 MCG tablet Take 1 tablet (125 mcg total) by mouth daily.  90 tablet  3  . lisinopril-hydrochlorothiazide (PRINZIDE,ZESTORETIC) 20-25 MG per tablet Take 1 tablet by mouth daily.  90 tablet  3  . nicotine (NICODERM CQ - DOSED IN MG/24 HOURS) 14 mg/24hr patch Place 1 patch (14 mg total) onto the skin daily.  28 patch  0  . omeprazole (PRILOSEC) 40 MG capsule Take 40 mg by mouth daily.      Marland Kitchen acetaminophen (TYLENOL) 325 MG tablet Take 650 mg by mouth every 6 (six) hours as needed for headache.      Marland Kitchen HYDROcodone-acetaminophen (NORCO/VICODIN) 5-325 MG per tablet Take 1-2 tablets by mouth every 6 (six) hours as needed for moderate pain.  30 tablet  0   No current facility-administered medications on file prior to visit.   Family History  Problem Relation Age of Onset  . Diabetes Mother   . Heart disease Mother     Before age 59  . Cancer Father     Lung  . Hypertension Sister   . Diabetes Sister   . Diabetes Sister   . Diabetes Sister    History   Social History  . Marital Status: Legally Separated  Spouse Name: N/A    Number of Children: N/A  . Years of Education: N/A   Occupational History  . Not on file.   Social History Main Topics  . Smoking status: Former Smoker -- 0.20 packs/day for 20 years    Types: Cigarettes    Quit date: 11/15/2013  . Smokeless tobacco: Never Used  . Alcohol Use: No  . Drug Use: No  . Sexual Activity: No   Other Topics Concern  . Not on file   Social History Narrative  . No narrative on file    Review of Systems: Constitutional: Negative for fever, chills, diaphoresis, activity change, appetite change and fatigue. HENT: Negative for ear pain, nosebleeds, congestion, facial swelling, rhinorrhea, neck pain, neck stiffness and ear discharge.  Eyes: Negative for pain, discharge, redness, itching and visual disturbance. Respiratory: Negative for cough, choking, chest tightness, shortness of breath, wheezing and stridor.   Cardiovascular: Negative for chest pain, palpitations and leg swelling. Gastrointestinal: Negative for abdominal distention. Genitourinary: Negative for dysuria, urgency, frequency, hematuria, flank pain, decreased urine volume, difficulty urinating and dyspareunia.  Musculoskeletal: Negative for back pain, joint swelling, arthralgias and gait problem. Neurological: Negative for dizziness, tremors, seizures, syncope, facial asymmetry, speech difficulty, weakness, light-headedness, numbness and headaches.  Hematological: Negative for adenopathy. Does not bruise/bleed easily. Psychiatric/Behavioral: Negative for hallucinations, behavioral problems, confusion, dysphoric mood, decreased concentration and agitation.    Objective:   Filed Vitals:   01/09/14 1722  BP: 159/78  Pulse: 83  Temp: 98.7 F (37.1 C)  Resp: 16    Physical Exam: Constitutional: Patient appears well-developed and well-nourished. No distress. HENT: Normocephalic, atraumatic, External right and left ear normal. Oropharynx is clear and moist.  Eyes: Conjunctivae and EOM are normal. PERRLA, no scleral icterus. Neck: Normal ROM. Neck supple. No JVD. No tracheal deviation. No thyromegaly. CVS: RRR, S1/S2 +, no murmurs, no gallops, no carotid bruit.  Pulmonary: Effort and breath sounds normal, no stridor, rhonchi, wheezes, rales.  Abdominal: Soft. BS +,  no distension, tenderness, rebound or guarding.  Musculoskeletal: Normal range of motion. No edema and no tenderness.  Lymphadenopathy: No lymphadenopathy noted, cervical, inguinal or axillary Neuro: Alert. Normal reflexes, muscle tone coordination. No cranial nerve deficit. Skin: Skin is warm and dry. No rash noted. Not diaphoretic. No erythema. No pallor. Psychiatric: Normal mood and affect. Behavior, judgment, thought content normal.  Lab Results  Component Value Date   WBC 12.1* 11/29/2013   HGB 13.1 11/29/2013   HCT 39.6 11/29/2013   MCV 83.0 11/29/2013   PLT 232  11/29/2013   Lab Results  Component Value Date   CREATININE 1.02 11/29/2013   BUN 10 11/29/2013   NA 141 11/29/2013   K 3.7 11/29/2013   CL 97 11/29/2013   CO2 27 11/29/2013    Lab Results  Component Value Date   HGBA1C 10.5* 11/02/2013   Lipid Panel     Component Value Date/Time   CHOL 221* 11/02/2013 0225   TRIG 190* 11/02/2013 0225   HDL 36* 11/02/2013 0225   CHOLHDL 6.1 11/02/2013 0225   VLDL 38 11/02/2013 0225   LDLCALC 147* 11/02/2013 0225       Assessment and plan:   Marnee was seen today for follow-up.  Diagnoses and associated orders for this visit:  Diabetes - Glucose (CBG) - insulin aspart (NOVOLOG) 100 UNIT/ML injection; Check BS three times per day before meals If BS is 150-200 give 3 units If BS is 201-250 give 5 units 251-300 give 7 units  301-350 give 10 units If greater than 350, call office Will discuss the possibility of starting byetta depending on cost of medication. Patient reports that she has tried with success in past. Will need to d/c novolog if begin byetta.  HTN (hypertension) Begin taking - amLODipine (NORVASC) 5 MG tablet; Take 1 tablet (5 mg total) by mouth daily.  Patient will RTC in 2 weeks for repeat TSH and hemoglobin A1C, medication management will be decided from results.  Patient meet with nurse at same time to look over recorded CBG's.       Lance Bosch, [facility] [phone] 01/10/2014, 10:54 PM

## 2014-01-28 ENCOUNTER — Other Ambulatory Visit: Payer: No Typology Code available for payment source

## 2014-02-04 ENCOUNTER — Other Ambulatory Visit: Payer: No Typology Code available for payment source

## 2014-02-12 ENCOUNTER — Other Ambulatory Visit: Payer: Self-pay

## 2014-02-12 MED ORDER — BUPROPION HCL 100 MG PO TABS: ORAL_TABLET | ORAL | Status: DC

## 2014-02-14 ENCOUNTER — Ambulatory Visit (INDEPENDENT_AMBULATORY_CARE_PROVIDER_SITE_OTHER): Payer: No Typology Code available for payment source | Admitting: Nurse Practitioner

## 2014-02-14 ENCOUNTER — Other Ambulatory Visit: Payer: Self-pay | Admitting: Family

## 2014-02-14 ENCOUNTER — Encounter: Payer: Self-pay | Admitting: Nurse Practitioner

## 2014-02-14 VITALS — BP 143/60 | HR 76 | Temp 98.5°F | Ht 63.0 in | Wt 152.0 lb

## 2014-02-14 DIAGNOSIS — I6529 Occlusion and stenosis of unspecified carotid artery: Secondary | ICD-10-CM

## 2014-02-14 DIAGNOSIS — M6281 Muscle weakness (generalized): Secondary | ICD-10-CM

## 2014-02-14 DIAGNOSIS — R29898 Other symptoms and signs involving the musculoskeletal system: Secondary | ICD-10-CM

## 2014-02-14 NOTE — Patient Instructions (Addendum)
Continue aspirin 325 mg orally every day for secondary stroke prevention and maintain strict control of hypertension with blood pressure goal below 140/90, diabetes with hemoglobin A1c goal below 7% and lipids with LDL cholesterol goal below 70 mg/dL.  Followup in the future in 3-4 months, sooner as needed.  Stroke Prevention Some medical conditions and behaviors are associated with an increased chance of having a stroke. You may prevent a stroke by making healthy choices and managing medical conditions. HOW CAN I REDUCE MY RISK OF HAVING A STROKE?   Stay physically active. Get at least 30 minutes of activity on most or all days.  Do not smoke. It may also be helpful to avoid exposure to secondhand smoke.  Limit alcohol use. Moderate alcohol use is considered to be:  No more than 2 drinks per day for men.  No more than 1 drink per day for nonpregnant women.  Eat healthy foods. This involves  Eating 5 or more servings of fruits and vegetables a day.  Following a diet that addresses high blood pressure (hypertension), high cholesterol, diabetes, or obesity.  Manage your cholesterol levels.  A diet low in saturated fat, trans fat, and cholesterol and high in fiber may control cholesterol levels.  Take any prescribed medicines to control cholesterol as directed by your health care provider.  Manage your diabetes.  A controlled-carbohydrate, controlled-sugar diet is recommended to manage diabetes.  Take any prescribed medicines to control diabetes as directed by your health care provider.  Control your hypertension.  A low-salt (sodium), low-saturated fat, low-trans fat, and low-cholesterol diet is recommended to manage hypertension.  Take any prescribed medicines to control hypertension as directed by your health care provider.  Maintain a healthy weight.  A reduced-calorie, low-sodium, low-saturated fat, low-trans fat, low-cholesterol diet is recommended to manage  weight.  Stop drug abuse.  Avoid taking birth control pills.  Talk to your health care provider about the risks of taking birth control pills if you are over 13 years old, smoke, get migraines, or have ever had a blood clot.  Get evaluated for sleep disorders (sleep apnea).  Talk to your health care provider about getting a sleep evaluation if you snore a lot or have excessive sleepiness.  Take medicines as directed by your health care provider.  For some people, aspirin or blood thinners (anticoagulants) are helpful in reducing the risk of forming abnormal blood clots that can lead to stroke. If you have the irregular heart rhythm of atrial fibrillation, you should be on a blood thinner unless there is a good reason you cannot take them.  Understand all your medicine instructions.  Make sure that other other conditions (such as anemia or atherosclerosis) are addressed. SEEK IMMEDIATE MEDICAL CARE IF:   You have sudden weakness or numbness of the face, arm, or leg, especially on one side of the body.  Your face or eyelid droops to one side.  You have sudden confusion.  You have trouble speaking (aphasia) or understanding.  You have sudden trouble seeing in one or both eyes.  You have sudden trouble walking.  You have dizziness.  You have a loss of balance or coordination.  You have a sudden, severe headache with no known cause.  You have new chest pain or an irregular heartbeat. Any of these symptoms may represent a serious problem that is an emergency. Do not wait to see if the symptoms will go away. Get medical help at once. Call your local emergency services  (911  in [place] Do not drive yourself to the hospital. Document Released: 09/09/2004 Document Revised: 05/23/2013 Document Reviewed: 02/02/2013 [facility] Patient Information 2015 [place], [place]. This information is not intended to replace advice given to you by your health care provider. Make sure you discuss any  questions you have with your health care provider.

## 2014-02-14 NOTE — Progress Notes (Signed)
PATIENT: Sonia Skinner DOB: 1951/04/02  REASON FOR VISIT: hospital follow up for stroke HISTORY FROM: patient  HISTORY OF PRESENT ILLNESS: Sonia Skinner is a 63 y.o. female with a history of hypertension and diabetes mellitus who comes in for first office follow up after hospital discharge for stroke.  She presented at [facility] on 11/01/13 with a complaint of intermittent blurring of vision involving left eye for about one week, and new onset weakness involving right upper extremity. CT scan of her head showed no acute intracranial abnormality except for possible abnormal density involving the left frontal lobe superiorly, suggestive of possible subacute stroke. Patient had not been on antiplatelet therapy. Has no previous history of stroke nor TIA. NIH stroke score was 1 for pronation and drift of right upper extremity. Work up at that time revealed an elevated A1c 10.5 and LDL 147. MRI brain showed patchy acute left MCA infarct.  There was critical left ICA stenosis by CTA. Right carotid revealed a plaque at the bifurcation. Vertebrals were patent. MRA showed Moderate to marked tandem stenosis of the internal carotid artery cavernous segment bilaterally. Moderate to marked narrowing proximal left middle cerebral artery branch. Moderate narrowing involving portions of the posterior inferior cerebellar artery bilaterally. Poor delineation left anterior inferior cerebellar artery. Small caliber right superior cerebellar artery. Echocardiogram showed no intracardiac masses or thrombi. She underwent left CEA, with a stenosis of 95%, on 11/04/2013 by Dr. Scot Dock and was discharged from the hospital on 11/06/13 on aspirin.  She awakened at about 11PM 11/24/2013 and was dizzy but was able to eat and went back to bed. When she awakened the next morning morning and started to wash her face she noted that the left side of her face was numb. She went on to church and started to feel even worse. Became dizzy and nauseous and  felt numb on the entire left side. Patient called EMS at that time. MRI was negative for acute infarct. A week later the left facial numbness went back to baseline, the nausea resolved by the next morning.  She still has loss of fine motor skill in left hand. She finished physical therapy, is left hand dominant, still has some right arm loss of dexterity, denies right leg weakness. She states still some pulling feeling at left side of neck, the area is tender but well healed. Has keloid scar at left CEA incision.  Her blood pressure is well controlled, it is 143/60 in office today. She is tolerating her daily aspirin well without significant bruising or bleeding. She is not yet had her cholesterol rechecked since the hospital and starting on Lipitor. She has no known side effects with Lipitor. Her morning blood sugars run between 100-200. She states that she is working on his and trying to change her diet.  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 She hopes that at the start of the school year she can go back as a Optometrist. She plans to work until age 50 to get full retirement.  REVIEW OF SYSTEMS: Full 14 system review of systems performed and notable only for: Weight gain, fatigue, hearing loss, blurred vision, constipation, cramps, memory loss, weakness.  ALLERGIES: Allergies  Allergen Reactions  . Anesthetics, Ester Anaphylaxis    HOME MEDICATIONS: Outpatient Prescriptions Prior to Visit  Medication Sig Dispense Refill  . acetaminophen (TYLENOL) 325 MG tablet Take 650 mg by mouth every  6 (six) hours as needed for headache.      Marland Kitchen amLODipine (NORVASC) 5 MG tablet Take 1 tablet (5 mg total) by mouth daily.  30 tablet  3  . aspirin 325 MG tablet Take 1 tablet (325 mg total) by mouth daily.      Marland Kitchen atorvastatin (LIPITOR) 40 MG tablet Take 1 tablet (40 mg total) by mouth daily at 6 PM.  30 tablet  0  . buPROPion  (WELLBUTRIN) 100 MG tablet Take 3 tablets in the morning (total 300 mg) and take one a half tablet in the evening (total 150 mg)  140 tablet  1  . docusate sodium 100 MG CAPS Take 100 mg by mouth daily.  10 capsule  0  . glipiZIDE (GLUCOTROL) 10 MG tablet Take 20 mg by mouth 2 (two) times daily before a meal.       . HYDROcodone-acetaminophen (NORCO/VICODIN) 5-325 MG per tablet Take 1-2 tablets by mouth every 6 (six) hours as needed for moderate pain.  30 tablet  0  . insulin aspart (NOVOLOG) 100 UNIT/ML injection Check BS three times per day before meals If BS is 150-200 give 3 units If BS is 201-250 give 5 units 251-300 give 7 units 301-350 give 10 units If greater than 350, call office  3 vial  PRN  . insulin glargine (LANTUS) 100 UNIT/ML injection Inject 40 Units into the skin 2 (two) times daily.      Marland Kitchen levothyroxine (SYNTHROID, LEVOTHROID) 125 MCG tablet Take 1 tablet (125 mcg total) by mouth daily.  90 tablet  3  . lisinopril-hydrochlorothiazide (PRINZIDE,ZESTORETIC) 20-25 MG per tablet Take 1 tablet by mouth daily.  90 tablet  3  . nicotine (NICODERM CQ - DOSED IN MG/24 HOURS) 14 mg/24hr patch Place 1 patch (14 mg total) onto the skin daily.  28 patch  0  . omeprazole (PRILOSEC) 40 MG capsule Take 40 mg by mouth daily.      Marland Kitchen omeprazole (PRILOSEC) 40 MG capsule TAKE 1 CAPSULE BY MOUTH ONCE DAILY.  30 capsule  3   No facility-administered medications prior to visit.    PHYSICAL EXAM Filed Vitals:   02/14/14 0934  BP: 143/60  Pulse: 76  Temp: 98.5 F (36.9 C)  TempSrc: Oral  Height: 5\' 3"  (1.6 m)  Weight: 152 lb (68.947 kg)   Body mass index is 26.93 kg/(m^2).  Visual Acuity Screening   Right eye Left eye Both eyes  Without correction: 20/40 20/30   With correction:      No flowsheet data found.  No flowsheet data found.   Generalized: Well developed, in no acute distress, pleasant AA female Head: normocephalic and atraumatic. Oropharynx benign  Neck: Supple, no  carotid bruits, left CEA scar with mild keloid Cardiac: Regular rate rhythm, no murmur  Musculoskeletal: No deformity   Neurological examination  Mentation: Alert oriented to time, place, history taking. Follows all commands speech and language fluent Cranial nerve II-XII: Fundoscopic exam not done. Pupils were equal round reactive to light extraocular movements were full, visual field were full on confrontational test. Facial sensation mildly decreased on the right but strength is normal bilaterally. Hearing was intact to finger rubbing bilaterally. Uvula tongue midline. head turning and shoulder shrug and were normal and symmetric.Tongue protrusion into cheek strength was normal. Motor: The motor testing reveals 5 over 5 strength of all 4 extremities. Good symmetric motor tone is noted throughout. Mildly decreased fine motor skills in left hand. Sensory: Sensory testing is intact  to soft touch on all 4 extremities. No evidence of extinction is noted.  Coordination: Cerebellar testing reveals good finger-nose-finger and heel-to-shin bilaterally.  Gait and station: Gait is normal. Tandem gait is normal. Romberg is negative. Reflexes: Deep tendon reflexes are symmetric and normal bilaterally.   NIHSS: 0 MRan: 1  ASSESSMENT: 63 y.o. year old female  has a past medical history of Hypertension; Thyroid disease; Complication of anesthesia; Diabetes mellitus; GERD (gastroesophageal reflux disease); Carotid artery occlusion; and left MCA stroke on 11/01/2013. Status post left CEA by Dr. Doren Custard. Left sided weakness occurred approximately 3 weeks later but no acute infarct was seen on MRI. Vascular risk factors of diabetes, former smoker, hypertension, and hyperlipidemia.   PLAN: I had a long discussion with the patient regarding her recent stroke, discussed results of evaluation in the hospital and answered questions.  Referral to neuro rehabilitation for occupational therapy. Continue aspirin 325 mg  orally every day for secondary stroke prevention and maintain strict control of hypertension with blood pressure goal below 140/90, diabetes with hemoglobin A1c goal below 7% and lipids with LDL cholesterol goal below 70 mg/dL. Followup in the future in 3 months, sooner as needed.  Orders Placed This Encounter  Procedures  . Ambulatory Referral to Neuro Rehab   Return in about 3 months (around 05/17/2014) for stroke follow up.  Philmore Pali, MSN, NP-C 02/14/2014, 1:06 PM [facility] [address] [place], [place] [address] [phone]  Note: This document was prepared with digital dictation and possible smart phrase technology. Any transcriptional errors that result from this process are unintentional.

## 2014-02-15 NOTE — Progress Notes (Signed)
I agree with above 

## 2014-02-21 ENCOUNTER — Ambulatory Visit: Payer: Self-pay | Admitting: Nurse Practitioner

## 2014-03-07 ENCOUNTER — Ambulatory Visit: Payer: No Typology Code available for payment source | Admitting: Occupational Therapy

## 2014-03-15 ENCOUNTER — Other Ambulatory Visit: Payer: Self-pay | Admitting: Internal Medicine

## 2014-03-18 ENCOUNTER — Ambulatory Visit: Payer: No Typology Code available for payment source | Admitting: Occupational Therapy

## 2014-03-28 ENCOUNTER — Ambulatory Visit: Payer: No Typology Code available for payment source

## 2014-04-04 ENCOUNTER — Ambulatory Visit: Payer: No Typology Code available for payment source | Attending: Internal Medicine

## 2014-05-08 ENCOUNTER — Ambulatory Visit: Payer: No Typology Code available for payment source | Attending: Internal Medicine | Admitting: Internal Medicine

## 2014-05-08 ENCOUNTER — Encounter: Payer: Self-pay | Admitting: Internal Medicine

## 2014-05-08 VITALS — BP 148/69 | HR 96 | Temp 98.6°F | Resp 16 | Ht 62.0 in | Wt 161.0 lb

## 2014-05-08 DIAGNOSIS — Z8249 Family history of ischemic heart disease and other diseases of the circulatory system: Secondary | ICD-10-CM | POA: Insufficient documentation

## 2014-05-08 DIAGNOSIS — I10 Essential (primary) hypertension: Secondary | ICD-10-CM | POA: Insufficient documentation

## 2014-05-08 DIAGNOSIS — E119 Type 2 diabetes mellitus without complications: Secondary | ICD-10-CM | POA: Insufficient documentation

## 2014-05-08 DIAGNOSIS — Z794 Long term (current) use of insulin: Secondary | ICD-10-CM | POA: Insufficient documentation

## 2014-05-08 DIAGNOSIS — Z7982 Long term (current) use of aspirin: Secondary | ICD-10-CM | POA: Insufficient documentation

## 2014-05-08 DIAGNOSIS — Z884 Allergy status to anesthetic agent status: Secondary | ICD-10-CM | POA: Insufficient documentation

## 2014-05-08 DIAGNOSIS — E039 Hypothyroidism, unspecified: Secondary | ICD-10-CM | POA: Insufficient documentation

## 2014-05-08 DIAGNOSIS — L910 Hypertrophic scar: Secondary | ICD-10-CM | POA: Insufficient documentation

## 2014-05-08 DIAGNOSIS — Z833 Family history of diabetes mellitus: Secondary | ICD-10-CM | POA: Insufficient documentation

## 2014-05-08 DIAGNOSIS — Z23 Encounter for immunization: Secondary | ICD-10-CM

## 2014-05-08 DIAGNOSIS — Z79899 Other long term (current) drug therapy: Secondary | ICD-10-CM | POA: Insufficient documentation

## 2014-05-08 DIAGNOSIS — R739 Hyperglycemia, unspecified: Secondary | ICD-10-CM

## 2014-05-08 DIAGNOSIS — K219 Gastro-esophageal reflux disease without esophagitis: Secondary | ICD-10-CM | POA: Insufficient documentation

## 2014-05-08 DIAGNOSIS — I6529 Occlusion and stenosis of unspecified carotid artery: Secondary | ICD-10-CM | POA: Insufficient documentation

## 2014-05-08 DIAGNOSIS — Z8673 Personal history of transient ischemic attack (TIA), and cerebral infarction without residual deficits: Secondary | ICD-10-CM | POA: Insufficient documentation

## 2014-05-08 DIAGNOSIS — Z87891 Personal history of nicotine dependence: Secondary | ICD-10-CM | POA: Insufficient documentation

## 2014-05-08 DIAGNOSIS — R7309 Other abnormal glucose: Secondary | ICD-10-CM

## 2014-05-08 LAB — GLUCOSE, POCT (MANUAL RESULT ENTRY): POC Glucose: 307 mg/dl — AB (ref 70–99)

## 2014-05-08 LAB — POCT GLYCOSYLATED HEMOGLOBIN (HGB A1C): Hemoglobin A1C: 9.4

## 2014-05-08 MED ORDER — EXENATIDE 10 MCG/0.04ML ~~LOC~~ SOPN: 5.0000 ug | PEN_INJECTOR | Freq: Two times a day (BID) | SUBCUTANEOUS | Status: DC

## 2014-05-08 NOTE — Progress Notes (Signed)
F/U DM Pt stated is gaining to much weight

## 2014-05-08 NOTE — Progress Notes (Signed)
Patient ID: Sonia Skinner, female   DOB: 04-02-1951, 63 y.o.   MRN: [id number]  CC: DM and HTN  HPI:  Patient presents to clinic today for a follow up of hypertension and DM.  Patient reports good medication compliance.  She states that she is still having problems with increased appetite at night.  She states that when she was on Byetta in the past she had good control over night time snacking and her A1C.    Allergies  Allergen Reactions  . Anesthetics, Ester Anaphylaxis   Past Medical History  Diagnosis Date  . Hypertension   . Thyroid disease   . Complication of anesthesia     ALLERY TO ESTER BASE  . Diabetes mellitus     INSULIN DEPENDENT  . GERD (gastroesophageal reflux disease)   . Carotid artery occlusion   . Stroke March 2015     left MCA infarct   Current Outpatient Prescriptions on File Prior to Visit  Medication Sig Dispense Refill  . acetaminophen (TYLENOL) 325 MG tablet Take 650 mg by mouth every 6 (six) hours as needed for headache.      Marland Kitchen amLODipine (NORVASC) 5 MG tablet Take 1 tablet (5 mg total) by mouth daily.  30 tablet  3  . aspirin 325 MG tablet Take 1 tablet (325 mg total) by mouth daily.      Marland Kitchen atorvastatin (LIPITOR) 40 MG tablet TAKE 1 TABLET BY MOUTH ONCE DAILY AT 6 IN THE EVENING  30 tablet  2  . buPROPion (WELLBUTRIN) 100 MG tablet Take 3 tablets in the morning (total 300 mg) and take one a half tablet in the evening (total 150 mg)  140 tablet  1  . docusate sodium 100 MG CAPS Take 100 mg by mouth daily.  10 capsule  0  . glipiZIDE (GLUCOTROL) 10 MG tablet Take 20 mg by mouth 2 (two) times daily before a meal.       . HYDROcodone-acetaminophen (NORCO/VICODIN) 5-325 MG per tablet Take 1-2 tablets by mouth every 6 (six) hours as needed for moderate pain.  30 tablet  0  . insulin aspart (NOVOLOG) 100 UNIT/ML injection Check BS three times per day before meals If BS is 150-200 give 3 units If BS is 201-250 give 5 units 251-300 give 7 units 301-350 give 10  units If greater than 350, call office  3 vial  PRN  . insulin glargine (LANTUS) 100 UNIT/ML injection Inject 40 Units into the skin 2 (two) times daily.      Marland Kitchen levothyroxine (SYNTHROID, LEVOTHROID) 125 MCG tablet Take 1 tablet (125 mcg total) by mouth daily.  90 tablet  3  . lisinopril-hydrochlorothiazide (PRINZIDE,ZESTORETIC) 20-25 MG per tablet Take 1 tablet by mouth daily.  90 tablet  3  . omeprazole (PRILOSEC) 40 MG capsule Take 40 mg by mouth daily.      . nicotine (NICODERM CQ - DOSED IN MG/24 HOURS) 14 mg/24hr patch Place 1 patch (14 mg total) onto the skin daily.  28 patch  0  . omeprazole (PRILOSEC) 40 MG capsule TAKE 1 CAPSULE BY MOUTH ONCE DAILY.  30 capsule  3   No current facility-administered medications on file prior to visit.   Family History  Problem Relation Age of Onset  . Diabetes Mother   . Heart disease Mother     Before age 52  . Cancer Father     Lung  . Hypertension Sister   . Diabetes Sister   . Diabetes Sister   .  Diabetes Sister    History   Social History  . Marital Status: Legally Separated    Spouse Name: N/A    Number of Children: 0  . Years of Education: College   Occupational History  . [place] History Main Topics  . Smoking status: Former Smoker -- 0.20 packs/day for 20 years    Types: Cigarettes    Quit date: 11/01/2013  . Smokeless tobacco: Never Used  . Alcohol Use: No  . Drug Use: No  . Sexual Activity: No   Other Topics Concern  . Not on file   Social History Narrative   Patient lives at home with sister.   Caffeine Use: 2 cups daily; sodas occasionally    Review of Systems: Constitutional: Negative for fever, chills, diaphoresis, activity change, appetite change and fatigue. HENT: Negative for ear pain, nosebleeds, congestion, facial swelling, rhinorrhea, neck pain, neck stiffness and ear discharge.  Eyes: Negative for pain, discharge, redness, itching and visual disturbance. Respiratory: Negative  for cough, choking, chest tightness, shortness of breath, wheezing and stridor.  Cardiovascular: Negative for chest pain, palpitations and leg swelling. Gastrointestinal: Negative for abdominal distention. Genitourinary: Negative for dysuria, urgency, frequency, hematuria, flank pain, decreased urine volume, difficulty urinating and dyspareunia.  Musculoskeletal: Negative for back pain, joint swelling, arthralgias and gait problem. Neurological: Negative for dizziness, tremors, seizures, syncope, facial asymmetry, speech difficulty, weakness, light-headedness, numbness and headaches.  Hematological: Negative for adenopathy. Does not bruise/bleed easily. Psychiatric/Behavioral: Negative for hallucinations, behavioral problems, confusion, dysphoric mood, decreased concentration and agitation.    Objective:   Filed Vitals:   05/08/14 1651  BP: 148/69  Pulse: 96  Temp: 98.6 F (37 C)  Resp: 16    Physical Exam  Constitutional: She is oriented to person, place, and time.  Neck: Normal range of motion.  Cardiovascular: Normal rate, regular rhythm and normal heart sounds.   Pulmonary/Chest: Effort normal and breath sounds normal.  Musculoskeletal: She exhibits no edema and no tenderness.  Neurological: She is alert and oriented to person, place, and time. She has normal reflexes.  Skin: Skin is warm and dry.  '  Lab Results  Component Value Date   WBC 12.1* 11/29/2013   HGB 13.1 11/29/2013   HCT 39.6 11/29/2013   MCV 83.0 11/29/2013   PLT 232 11/29/2013   Lab Results  Component Value Date   CREATININE 1.02 11/29/2013   BUN 10 11/29/2013   NA 141 11/29/2013   K 3.7 11/29/2013   CL 97 11/29/2013   CO2 27 11/29/2013    Lab Results  Component Value Date   HGBA1C 9.4 05/08/2014   Lipid Panel     Component Value Date/Time   CHOL 221* 11/02/2013 0225   TRIG 190* 11/02/2013 0225   HDL 36* 11/02/2013 0225   CHOLHDL 6.1 11/02/2013 0225   VLDL 38 11/02/2013 0225   LDLCALC 147* 11/02/2013  0225       Assessment and plan:   Sonia Skinner was seen today for no specified reason.  Diagnoses and associated orders for this visit:  Type 2 diabetes mellitus without complication - Ambulatory referral to Podiatry - exenatide (BYETTA) 10 MCG/0.04ML SOPN injection; Inject 0.02 mLs (5 mcg total) into the skin 2 (two) times daily with a meal. D/ced patients glipizide until able to see how much change in cbg byetta will give  Unspecified hypothyroidism - Cancel: TSH Patient refused to let lab draw her blood and left office.  Will have nurse to call  patient and urge her to come back  Hyperglycemia - Glucose (CBG) - POCT A1C  Keloid scar-Left neck post-op site - Ambulatory referral to Dermatology  Return in about 2 weeks (around 05/22/2014) for Nurse Visit-cbg check,new med and 3 mo PCP. Nurse will show NP the cbg log for further management       Chari Manning, [facility] [phone] 05/19/2014, 5:05 PM

## 2014-05-08 NOTE — Patient Instructions (Signed)
Stop glipizide and Novolog. May begin Byetta 5 mcg twice daily    Exenatide injection solution What is this medicine? EXENATIDE (ex EN a tide) is used to improve blood sugar control in adults with type 2 diabetes. This medicine may be used with other oral diabetes medicines. This medicine may be used for other purposes; ask your health care provider or pharmacist if you have questions. COMMON BRAND NAME(S): Byetta What should I tell my health care provider before I take this medicine? They need to know if you have any of these conditions: -history of pancreatitis -kidney disease or if you are on dialysis -stomach problems -an unusual or allergic reaction to exenatide, medicines, foods, dyes, or preservatives -pregnant or trying to get pregnant -breast-feeding How should I use this medicine? This medicine is for injection under the skin of your upper leg, stomach area, or upper arm. You will be taught how to prepare and give this medicine. Use exactly as directed. Take your medicine at regular intervals. Do not take it more often than directed. It is important that you put your used needles and syringes in a special sharps container. Do not put them in a trash can. If you do not have a sharps container, call your pharmacist or healthcare provider to get one. A special MedGuide will be given to you by the pharmacist with each prescription and refill. Be sure to read this information carefully each time.Talk to your pediatrician regarding the use of this medicine in children. Special care may be needed. Overdosage: If you think you have taken too much of this medicine contact a poison control center or emergency room at once. NOTE: This medicine is only for you. Do not share this medicine with others. What if I miss a dose? If you miss a dose, take it as soon as you can. If it is almost time for your next dose, take only that dose. Do not take double or extra doses. What may interact with this  medicine? Do not take this medicine with any of the following medications: -gatifloxacin This medicine may also interact with the following medications: -acetaminophen -birth control pills -digoxin -lisinopril -lovastatin -sulfonylureas -warfarin Many medications may cause changes in blood sugar, these include: -alcohol containing beverages -aspirin and aspirin-like drugs -chloramphenicol -chromium -diuretics -female hormones, such as estrogens or progestins, birth control pills -heart medicines -isoniazid -female hormones or anabolic steroids -medications for weight loss -medicines for allergies, asthma, cold, or cough -medicines for mental problems -medicines called MAO inhibitors - Nardil, Parnate, Marplan, Eldepryl -niacin -NSAIDS, such as ibuprofen -pentamidine -phenytoin -probenecid -quinolone antibiotics such as ciprofloxacin, levofloxacin, ofloxacin -some herbal dietary supplements -steroid medicines such as prednisone or cortisone -thyroid hormones Some medications can hide the warning symptoms of low blood sugar (hypoglycemia). You may need to monitor your blood sugar more closely if you are taking one of these medications. These include: -beta-blockers, often used for high blood pressure or heart problems (examples include atenolol, metoprolol, propranolol) -clonidine -guanethidine -reserpine This list may not describe all possible interactions. Give your health care provider a list of all the medicines, herbs, non-prescription drugs, or dietary supplements you use. Also tell them if you smoke, drink alcohol, or use illegal drugs. Some items may interact with your medicine. What should I watch for while using this medicine? Visit your doctor or health care professional for regular checks on your progress. A test called the HbA1C (A1C) will be monitored. This is a simple blood test. It measures your blood  sugar control over the last 2 to 3 months. You will receive  this test every 3 to 6 months. Learn how to check your blood sugar. Learn the symptoms of low and high blood sugar and how to manage them. Always carry a quick-source of sugar with you in case you have symptoms of low blood sugar. Examples include hard sugar candy or glucose tablets. Make sure others know that you can choke if you eat or drink when you develop serious symptoms of low blood sugar, such as seizures or unconsciousness. They must get medical help at once. Tell your doctor or health care professional if you have high blood sugar. You might need to change the dose of your medicine. If you are sick or exercising more than usual, you might need to change the dose of your medicine. Do not skip meals. Ask your doctor or health care professional if you should avoid alcohol. Many nonprescription cough and cold products contain sugar or alcohol. These can affect blood sugar. Exenatide pens and cartridges should never be shared. Even if the needle is changed, sharing may result in passing of viruses like hepatitis or HIV. Wear a medical ID bracelet or chain, and carry a card that describes your disease and details of your medicine and dosage times. What side effects may I notice from receiving this medicine? Side effects that you should report to your doctor or health care professional as soon as possible: -allergic reactions like skin rash, itching or hives, swelling of the face, lips, or tongue -breathing problems -signs and symptoms of low blood sugar such as feeling anxious, confusion, dizziness, increased hunger, unusually weak or tired, sweating, shakiness, cold, irritable, headache, blurred vision, fast heartbeat, loss of consciousness -swelling of the ankles, feet, hands -trouble passing urine or change in the amount of urine -unusual stomach pain or upset -unusually weak or tired -vomiting Side effects that usually do not require medical attention (report to your doctor or health care  professional if they continue or are bothersome): -constipation -diarrhea -dizziness -headache -heartburn -nausea This list may not describe all possible side effects. Call your doctor for medical advice about side effects. You may report side effects to FDA at 1-800-FDA-1088. Where should I keep my medicine? Keep out of the reach of children. Store unopened pen in a refrigerator between 2 and 8 degrees C (36 and 46 degrees F). Do not freeze or use if the medicine has been frozen. Protect from light and excessive heat. After you first use the pen, it should be kept at a temperature not to exceed 25 degrees C (77 degrees F). Throw away your used pen after 30 days or after the expiration date, whichever comes first. Do not store your pen with the needle attached. If the needle is left on, medicine may leak from the pen or air bubbles may form in the cartridge. NOTE: This sheet is a summary. It may not cover all possible information. If you have questions about this medicine, talk to your doctor, pharmacist, or health care provider.  2015, Elsevier/Gold Standard. (2013-10-11 10:22:05)

## 2014-05-13 ENCOUNTER — Other Ambulatory Visit: Payer: Self-pay | Admitting: Internal Medicine

## 2014-05-14 ENCOUNTER — Encounter: Payer: Self-pay | Admitting: Nurse Practitioner

## 2014-05-14 ENCOUNTER — Ambulatory Visit (INDEPENDENT_AMBULATORY_CARE_PROVIDER_SITE_OTHER): Payer: Self-pay | Admitting: Nurse Practitioner

## 2014-05-14 VITALS — BP 156/67 | HR 102 | Ht 62.0 in | Wt 159.2 lb

## 2014-05-14 DIAGNOSIS — I6529 Occlusion and stenosis of unspecified carotid artery: Secondary | ICD-10-CM

## 2014-05-14 NOTE — Progress Notes (Signed)
PATIENT: Sonia Skinner DOB: 11-Mar-1951  REASON FOR VISIT: routine follow up for stroke HISTORY FROM: patient  HISTORY OF PRESENT ILLNESS: Sonia Skinner is a 63 y.o. female with a history of hypertension and diabetes mellitus who comes in for first office follow up after hospital discharge for stroke.  She presented at [facility] on 11/01/13 with a complaint of intermittent blurring of vision involving left eye for about one week, and new onset weakness involving right upper extremity. CT scan of her head showed no acute intracranial abnormality except for possible abnormal density involving the left frontal lobe superiorly, suggestive of possible subacute stroke. Patient had not been on antiplatelet therapy. Has no previous history of stroke nor TIA. NIH stroke score was 1 for pronation and drift of right upper extremity. Work up at that time revealed an elevated A1c 10.5 and LDL 147. MRI brain showed patchy acute left MCA infarct.  There was critical left ICA stenosis by CTA. Right carotid revealed a plaque at the bifurcation. Vertebrals were patent. MRA showed Moderate to marked tandem stenosis of the internal carotid artery cavernous segment bilaterally. Moderate to marked narrowing proximal left middle cerebral artery branch. Moderate narrowing involving portions of the posterior inferior cerebellar artery bilaterally. Poor delineation left anterior inferior cerebellar artery. Small caliber right superior cerebellar artery. Echocardiogram showed no intracardiac masses or thrombi. She underwent left CEA, with a stenosis of 95%, on 11/04/2013 by Dr. Scot Dock and was discharged from the hospital on 11/06/13 on aspirin.  She awakened at about 11PM 11/24/2013 and was dizzy but was able to eat and went back to bed. When she awakened the next morning morning and started to wash her face she noted that the left side of her face was numb. She went on to church and started to feel even worse. Became dizzy and nauseous and  felt numb on the entire left side. Patient called EMS at that time. MRI was negative for acute infarct. A week later the left facial numbness went back to baseline, the nausea resolved by the next morning.  She still has loss of fine motor skill in left hand. She finished physical therapy, is left hand dominant, still has some right arm loss of dexterity, denies right leg weakness. She states still some pulling feeling at left side of neck, the area is tender but well healed. Has keloid scar at left CEA incision.  Her blood pressure is well controlled, it is 143/60 in office today. She is tolerating her daily aspirin well without significant bruising or bleeding. She is not yet had her cholesterol rechecked since the hospital and starting on Lipitor. She has no known side effects with Lipitor. Her morning blood sugars run between 100-200. She states that she is working on his and trying to change her diet.  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 She hopes that at the start of the school year she can go back as a Optometrist. She plans to work until age 69 to get full retirement.  Update 05/14/14 (LL): Since last visit, she has been well but reports being more tired. She is finding it harder to keep up with her tasks at her job. She feels that she is not as organized as before and could not multi-task easily anymore. She states that she is much quicker to anger now. She states her blood pressure is well controlled, though it  is elevated in the office today at 156/67. She is tolerating her daily aspirin well without significant bruising or bleeding. Last hgb a1c was 9.4, but she is working closely with PCP to get better control of DM; hopes to start Byetta this week. Hopes it will help her lose the 20 lbs. she has gained recently.  She is tolerating aspirin well with no signs of significant bleeding or bruising.  REVIEW OF SYSTEMS:  Full 14 system review of systems performed and notable only for: Weight gain, fatigue, blurred vision, excessive eating, frequent waking, walking difficulty, memory loss,agitation, decreased concentration.    ALLERGIES: Allergies  Allergen Reactions  . Anesthetics, Ester Anaphylaxis    HOME MEDICATIONS: Outpatient Prescriptions Prior to Visit  Medication Sig Dispense Refill  . acetaminophen (TYLENOL) 325 MG tablet Take 650 mg by mouth every 6 (six) hours as needed for headache.      Marland Kitchen amLODipine (NORVASC) 5 MG tablet Take 1 tablet (5 mg total) by mouth daily.  30 tablet  3  . aspirin 325 MG tablet Take 1 tablet (325 mg total) by mouth daily.      Marland Kitchen atorvastatin (LIPITOR) 40 MG tablet TAKE 1 TABLET BY MOUTH ONCE DAILY AT 6 IN THE EVENING  30 tablet  2  . buPROPion (WELLBUTRIN) 100 MG tablet Take 3 tablets in the morning (total 300 mg) and take one a half tablet in the evening (total 150 mg)  140 tablet  1  . docusate sodium 100 MG CAPS Take 100 mg by mouth daily.  10 capsule  0  . exenatide (BYETTA) 10 MCG/0.04ML SOPN injection Inject 0.02 mLs (5 mcg total) into the skin 2 (two) times daily with a meal.  2.4 mL  3  . HYDROcodone-acetaminophen (NORCO/VICODIN) 5-325 MG per tablet Take 1-2 tablets by mouth every 6 (six) hours as needed for moderate pain.  30 tablet  0  . insulin glargine (LANTUS) 100 UNIT/ML injection Inject 40 Units into the skin 2 (two) times daily.      Marland Kitchen levothyroxine (SYNTHROID, LEVOTHROID) 125 MCG tablet Take 1 tablet (125 mcg total) by mouth daily.  90 tablet  3  . lisinopril-hydrochlorothiazide (PRINZIDE,ZESTORETIC) 20-25 MG per tablet Take 1 tablet by mouth daily.  90 tablet  3  . nicotine (NICODERM CQ - DOSED IN MG/24 HOURS) 14 mg/24hr patch Place 1 patch (14 mg total) onto the skin daily.  28 patch  0  . omeprazole (PRILOSEC) 40 MG capsule Take 40 mg by mouth daily.      Marland Kitchen omeprazole (PRILOSEC) 40 MG capsule TAKE 1 CAPSULE BY MOUTH ONCE DAILY.  30 capsule  3   No  facility-administered medications prior to visit.    PHYSICAL EXAM Filed Vitals:   05/14/14 1510  BP: 156/67  Pulse: 102  Height: 5\' 2"  (1.575 m)  Weight: 159 lb 3.2 oz (72.213 kg)   Body mass index is 29.11 kg/(m^2).  Generalized: Well developed, in no acute distress, pleasant AA female Head: normocephalic and atraumatic. Oropharynx benign  Neck: Supple, no carotid bruits, left CEA scar with mild keloid Cardiac: Regular rate rhythm, no murmur  Musculoskeletal: No deformity   Neurological examination  Mentation: Alert oriented to time, place, history taking. Follows all commands speech and language fluent Cranial nerve II-XII: Fundoscopic exam not done. Pupils were equal round reactive to light extraocular movements were full, visual field were full on confrontational test. Facial sensation mildly decreased on the right but strength is normal bilaterally. Hearing was intact to  finger rubbing bilaterally. Uvula tongue midline. head turning and shoulder shrug and were normal and symmetric.Tongue protrusion into cheek strength was normal. Motor: The motor testing reveals 5 over 5 strength of all 4 extremities. Good symmetric motor tone is noted throughout. Mildly decreased fine motor skills in left hand. Sensory: Sensory testing is intact to soft touch on all 4 extremities. No evidence of extinction is noted.  Coordination: Cerebellar testing reveals good finger-nose-finger and heel-to-shin bilaterally.  Gait and station: Gait is normal. Tandem gait is normal. Romberg is negative. Reflexes: Deep tendon reflexes are symmetric and normal bilaterally.   NIHSS: 0 MRan: 1  ASSESSMENT: 63 y.o. year old female  has a past medical history of Hypertension; Thyroid disease; Complication of anesthesia; Diabetes mellitus; GERD (gastroesophageal reflux disease); Carotid artery occlusion; and left MCA stroke on 11/01/2013. Status post left CEA by Dr. Doren Custard. Left sided weakness occurred approximately 3  weeks later but no acute infarct was seen on MRI. Vascular risk factors of diabetes, former smoker, hypertension, and hyperlipidemia.   PLAN: I had a long discussion with the patient regarding her recent stroke, discussed stroke prevention and reducing risk factors and answered questions. Continue aspirin 325 mg orally every day for secondary stroke prevention and maintain strict control of hypertension with blood pressure goal below 140/90, diabetes with hemoglobin A1c goal below 7% and lipids with LDL cholesterol goal below 70 mg/dL. Followup in the future in 6 months, sooner as needed.  Sonia Rummage Marrisa Kimber, MSN, FNP-BC, A/GNP-C 05/14/2014, 3:20 PM [facility] [address], [place], [place] [address] [phone]  Note: This document was prepared with digital dictation and possible smart phrase technology. Any transcriptional errors that result from this process are unintentional.

## 2014-05-14 NOTE — Patient Instructions (Signed)
Continue aspirin 325 mg orally every day for secondary stroke prevention and maintain strict control of hypertension with blood pressure goal below 140/90, diabetes with hemoglobin A1c goal below 7% and lipids with LDL cholesterol goal below 70 mg/dL. Followup in the future in 6 months, sooner as needed.  Stroke Prevention Some medical conditions and behaviors are associated with an increased chance of having a stroke. You may prevent a stroke by making healthy choices and managing medical conditions. HOW CAN I REDUCE MY RISK OF HAVING A STROKE?   Stay physically active. Get at least 30 minutes of activity on most or all days.  Do not smoke. It may also be helpful to avoid exposure to secondhand smoke.  Limit alcohol use. Moderate alcohol use is considered to be:  No more than 2 drinks per day for men.  No more than 1 drink per day for nonpregnant women.  Eat healthy foods. This involves:  Eating 5 or more servings of fruits and vegetables a day.  Making dietary changes that address high blood pressure (hypertension), high cholesterol, diabetes, or obesity.  Manage your cholesterol levels.  Making food choices that are high in fiber and low in saturated fat, trans fat, and cholesterol may control cholesterol levels.  Take any prescribed medicines to control cholesterol as directed by your health care provider.  Manage your diabetes.  Controlling your carbohydrate and sugar intake is recommended to manage diabetes.  Take any prescribed medicines to control diabetes as directed by your health care provider.  Control your hypertension.  Making food choices that are low in salt (sodium), saturated fat, trans fat, and cholesterol is recommended to manage hypertension.  Take any prescribed medicines to control hypertension as directed by your health care provider.  Maintain a healthy weight.  Reducing calorie intake and making food choices that are low in sodium, saturated fat,  trans fat, and cholesterol are recommended to manage weight.  Stop drug abuse.  Avoid taking birth control pills.  Talk to your health care provider about the risks of taking birth control pills if you are over 37 years old, smoke, get migraines, or have ever had a blood clot.  Get evaluated for sleep disorders (sleep apnea).  Talk to your health care provider about getting a sleep evaluation if you snore a lot or have excessive sleepiness.  Take medicines only as directed by your health care provider.  For some people, aspirin or blood thinners (anticoagulants) are helpful in reducing the risk of forming abnormal blood clots that can lead to stroke. If you have the irregular heart rhythm of atrial fibrillation, you should be on a blood thinner unless there is a good reason you cannot take them.  Understand all your medicine instructions.  Make sure that other conditions (such as anemia or atherosclerosis) are addressed. SEEK IMMEDIATE MEDICAL CARE IF:   You have sudden weakness or numbness of the face, arm, or leg, especially on one side of the body.  Your face or eyelid droops to one side.  You have sudden confusion.  You have trouble speaking (aphasia) or understanding.  You have sudden trouble seeing in one or both eyes.  You have sudden trouble walking.  You have dizziness.  You have a loss of balance or coordination.  You have a sudden, severe headache with no known cause.  You have new chest pain or an irregular heartbeat. Any of these symptoms may represent a serious problem that is an emergency. Do not wait to see  if the symptoms will go away. Get medical help at once. Call your local emergency services (911 in [place] Do not drive yourself to the hospital. Document Released: 09/09/2004 Document Revised: 12/17/2013 Document Reviewed: 02/02/2013 [facility] Patient Information 2015 [place], [place]. This information is not intended to replace advice given to you by your  health care provider. Make sure you discuss any questions you have with your health care provider.

## 2014-05-15 ENCOUNTER — Other Ambulatory Visit: Payer: Self-pay

## 2014-05-15 DIAGNOSIS — K2100 Gastro-esophageal reflux disease with esophagitis, without bleeding: Secondary | ICD-10-CM

## 2014-05-15 MED ORDER — OMEPRAZOLE 40 MG PO CPDR: DELAYED_RELEASE_CAPSULE | ORAL | Status: DC

## 2014-05-15 MED ORDER — LISINOPRIL-HYDROCHLOROTHIAZIDE 20-25 MG PO TABS: 1.0000 | ORAL_TABLET | Freq: Every day | ORAL | Status: DC

## 2014-05-16 ENCOUNTER — Ambulatory Visit: Payer: No Typology Code available for payment source | Attending: Internal Medicine

## 2014-05-16 ENCOUNTER — Ambulatory Visit: Payer: No Typology Code available for payment source | Admitting: Occupational Therapy

## 2014-05-16 NOTE — Progress Notes (Signed)
I agree with the above plan 

## 2014-05-17 ENCOUNTER — Ambulatory Visit: Payer: Self-pay | Admitting: Nurse Practitioner

## 2014-05-22 ENCOUNTER — Other Ambulatory Visit (HOSPITAL_COMMUNITY): Payer: No Typology Code available for payment source

## 2014-05-22 ENCOUNTER — Ambulatory Visit: Payer: No Typology Code available for payment source | Admitting: Family

## 2014-05-27 ENCOUNTER — Ambulatory Visit: Payer: No Typology Code available for payment source | Attending: Internal Medicine

## 2014-05-27 VITALS — BP 133/72 | HR 89 | Temp 98.5°F | Resp 16

## 2014-05-27 DIAGNOSIS — E1149 Type 2 diabetes mellitus with other diabetic neurological complication: Secondary | ICD-10-CM

## 2014-05-27 MED ORDER — LORATADINE 10 MG PO TABS: 10.0000 mg | ORAL_TABLET | Freq: Every day | ORAL | Status: DC

## 2014-05-27 MED ORDER — INSULIN GLARGINE 100 UNIT/ML ~~LOC~~ SOLN: SUBCUTANEOUS | Status: DC

## 2014-05-27 NOTE — Progress Notes (Unsigned)
Pt comes in for blood sugars with review of medications Pt was started on Byetta injections 5 units BId with already Lantus injections 60 units in the am and 20 units in pm Pt brought in CBG log which reflect high reading in the am/afternoon but decreases in the pm States number has improved with Byetta Spoke with Mateo Flow, we will continue medication with monitoring Pt to come back in 2 weeks for nurse visit

## 2014-05-27 NOTE — Patient Instructions (Signed)
Continue taking Injections as prescribed and return in 2 weeks with CBG log for nurse visit

## 2014-06-01 ENCOUNTER — Other Ambulatory Visit: Payer: Self-pay | Admitting: Internal Medicine

## 2014-06-03 ENCOUNTER — Ambulatory Visit: Payer: No Typology Code available for payment source | Attending: Nurse Practitioner | Admitting: Occupational Therapy

## 2014-06-03 DIAGNOSIS — Z5189 Encounter for other specified aftercare: Secondary | ICD-10-CM | POA: Insufficient documentation

## 2014-06-03 DIAGNOSIS — I6529 Occlusion and stenosis of unspecified carotid artery: Secondary | ICD-10-CM | POA: Insufficient documentation

## 2014-06-03 DIAGNOSIS — M6281 Muscle weakness (generalized): Secondary | ICD-10-CM | POA: Insufficient documentation

## 2014-06-05 ENCOUNTER — Ambulatory Visit: Payer: No Typology Code available for payment source | Admitting: Podiatrist

## 2014-06-05 ENCOUNTER — Encounter: Payer: Self-pay | Admitting: Podiatrist

## 2014-06-05 ENCOUNTER — Ambulatory Visit: Payer: No Typology Code available for payment source

## 2014-06-05 VITALS — BP 154/69 | HR 96 | Resp 14 | Ht 62.0 in | Wt 161.0 lb

## 2014-06-05 DIAGNOSIS — M79673 Pain in unspecified foot: Secondary | ICD-10-CM

## 2014-06-05 DIAGNOSIS — Z79899 Other long term (current) drug therapy: Secondary | ICD-10-CM

## 2014-06-05 DIAGNOSIS — B351 Tinea unguium: Secondary | ICD-10-CM

## 2014-06-05 MED ORDER — TERBINAFINE HCL 250 MG PO TABS: 250.0000 mg | ORAL_TABLET | Freq: Every day | ORAL | Status: DC

## 2014-06-05 NOTE — Patient Instructions (Signed)

## 2014-06-06 NOTE — Progress Notes (Signed)
Chief Complaint  Patient presents with  . Foot Pain    "My feet constantly hurt when I'm walking it feels like they're turning in."  plantar and lateral foot pain  . Nail Problem    "I have toenail fungus and I'm diabetic and I'd like to get them looked after." 10 toenails     HPI: Patient is 63 y.o. female who presents today for toenail fungus and foot pain.  She has tried no treatment  Physical Exam  Patient is awake, alert, and oriented x 3.  In no acute distress.  Vascular status is intact with palpable pedal pulses at 2/4 DP and PT bilateral and capillary refill time within normal limits. Neurological sensation is also intact bilaterally via Semmes Weinstein monofilament at 5/5 sites. Light touch, vibratory sensation, Achilles tendon reflex is intact. Dermatological exam thick, discolored and dystrophic toenails especially bilateral first.  She has a coating of gel nail on the distal tip of the nails however mycotic appearance of the nails is noted.    Pes planus is noted bilateral.  xrays negative for fracture  Assessment: onychomycosis, pes planus  Plan:  Powerstep inserts recommended.  Discussed therapy for the fungal nails.  Recommended oral therapy as the infection is deep in the nail.  A pre-medication lab form was dispensed.  Also a script for lamisil was written.  I will recheck her in 1 month and order repeat labs then.  If any concerns arise she will call

## 2014-06-10 ENCOUNTER — Other Ambulatory Visit: Payer: No Typology Code available for payment source

## 2014-06-10 ENCOUNTER — Ambulatory Visit: Payer: No Typology Code available for payment source | Attending: Internal Medicine

## 2014-06-10 VITALS — BP 139/79 | HR 79 | Temp 97.7°F | Resp 18 | Ht 62.0 in | Wt 156.0 lb

## 2014-06-10 DIAGNOSIS — E119 Type 2 diabetes mellitus without complications: Secondary | ICD-10-CM | POA: Insufficient documentation

## 2014-06-10 LAB — GLUCOSE, POCT (MANUAL RESULT ENTRY): POC Glucose: 111 mg/dl — AB (ref 70–99)

## 2014-06-10 NOTE — Patient Instructions (Signed)
Doing well. Continue current meds. Return for Dr visit in 2 months.  Return sooner if Blood glucose averages suddenly increase.

## 2014-06-10 NOTE — Progress Notes (Unsigned)
Patient presents today for CBG followup, provided log of blood sugars.   From 05-24-14 to 06-08-14  AM 83 - 240 Pm 50 - 218 Today 111.

## 2014-06-11 ENCOUNTER — Ambulatory Visit: Payer: No Typology Code available for payment source | Admitting: Occupational Therapy

## 2014-06-17 ENCOUNTER — Ambulatory Visit: Payer: No Typology Code available for payment source

## 2014-07-03 ENCOUNTER — Other Ambulatory Visit (HOSPITAL_COMMUNITY): Payer: No Typology Code available for payment source

## 2014-07-03 ENCOUNTER — Ambulatory Visit: Payer: No Typology Code available for payment source | Admitting: Vascular Surgery

## 2014-07-05 ENCOUNTER — Ambulatory Visit: Payer: No Typology Code available for payment source | Attending: Internal Medicine

## 2014-07-08 ENCOUNTER — Ambulatory Visit: Payer: No Typology Code available for payment source | Attending: Nurse Practitioner | Admitting: Occupational Therapy

## 2014-07-08 ENCOUNTER — Encounter: Payer: Self-pay | Admitting: Occupational Therapy

## 2014-07-08 DIAGNOSIS — I6529 Occlusion and stenosis of unspecified carotid artery: Secondary | ICD-10-CM | POA: Insufficient documentation

## 2014-07-08 DIAGNOSIS — Z5189 Encounter for other specified aftercare: Secondary | ICD-10-CM | POA: Insufficient documentation

## 2014-07-08 DIAGNOSIS — M6281 Muscle weakness (generalized): Secondary | ICD-10-CM | POA: Insufficient documentation

## 2014-07-08 NOTE — Patient Instructions (Signed)
1. Grip Strengthening (Resistive Putty)  Make a "hot dog" by rolling it on the table.  Keep it short and fat.   Squeeze putty using thumb and all fingers. Repeat _10___ times. Do __1-2__ sessions per day.   2. Roll putty into tube on table.  Keep tube on the table. Pinch between each finger and thumb x 10 reps each. (can do ring and small finger together)  3. Make a ball with putty.  Then make a pancake.  Use the palm of your hand and your fingers to stretch theraputty out as flat as possible.  Repeat 10 times.  Do 1-2 sessions per day.  Stop if you get pain.  Reduce the number of repetitions, space out the exercises, start with only one session per day. It is perfectly fine to take a rest break as you do your exercises.     Copyright  VHI. All rights reserved.  Extension (Assistive Putty)  WAIT TO DO THIS EXERCISE!!!   Roll putty back and forth, being sure to use all fingertips. Repeat ____ times. Do ____ sessions per day.  Copyright  VHI. All rights reserved.

## 2014-07-08 NOTE — Therapy (Signed)
Occupational Therapy Treatment  Patient Details  Name: Sonia Skinner MRN: [id number] Date of Birth: [birth year]  Encounter Date: 07/08/2014      OT End of Session - 07/08/14 1654    Visit Number 2   Number of Visits 8   Date for OT Re-Evaluation 07/30/14   OT Start Time 1615   OT Stop Time 1655   OT Time Calculation (min) 40 min   Activity Tolerance Patient tolerated treatment well      Past Medical History  Diagnosis Date  . Hypertension   . Thyroid disease   . Complication of anesthesia     ALLERY TO ESTER BASE  . Diabetes mellitus     INSULIN DEPENDENT  . GERD (gastroesophageal reflux disease)   . Carotid artery occlusion   . Stroke March 2015     left MCA infarct    Past Surgical History  Procedure Laterality Date  . Abdominal hysterectomy    . Endarterectomy Left 11/04/2013    Procedure: ENDARTERECTOMY CAROTID;  Surgeon: Angelia Mould, MD;  Location: [facility] OR;  Service: Vascular;  Laterality: Left;  . Carotid endarterectomy Left 11-04-13    cea    There were no vitals taken for this visit.  Visit Diagnosis:  Generalized muscle weakness      Subjective Assessment - 07/08/14 1649    Symptoms My hand gets tired   Pertinent History see epic snapshot   Currently in Pain? No/denies            OT Treatments/Exercises (OP) - 07/08/14 1650    Exercises   Exercises Hand   Hand Exercises   Theraputty Flatten;Roll;Grip;Locate Pegs  using red putty   Other Hand Exercises tracing shapes with large marker with left hand as pre writing skill; writing name and address x5 with built up pen.  Tried various AE for build up and patient to purchase the one she liked best.   Other Hand Exercises Writing 2 sentence paragraph with built up pen.  Patient required increased time and hand fatigues quickly.          OT Education - 07/08/14 1654    Education provided Yes   Education Details Theraputty home exercise program. - red putty   Person(s) Educated Patient    Methods Explanation;Demonstration;Handout   Comprehension Verbalized understanding;Returned demonstration            OT Long Term Goals - 07/08/14 1705    OT LONG TERM GOAL #1   Title Pt will verbalize understanding of the risk factors and warning sign/symptoms of CVA - 07/30/2014 (date moved forward 4 weeks due to scheduling conflicts)   Status On-going   OT LONG TERM GOAL #2   Title Pt will demonstrate increased hand strength by at least 8 pounds to assist with functional activities.   Status On-going   OT LONG TERM GOAL #3   Title Pt will demonstrate ability to write at paragraph length legibly with AE prn   Status On-going          Plan - 07/08/14 1657    Clinical Impression Statement PT was seen for OT eval on 06/11/2014 and returns today for her first OT treatment session due to scheduling conflicts.  Patient is motivated to work on increasing her strength and functional use of her R hand.   Rehab Potential Good   Clinical Impairments Affecting Rehab Potential Pt will benefit from skilled OT services to address:  decreased coordination decreased strength R hand, decreased tolerance  for sustained activity for R hand   OT Frequency 2x / week   OT Duration 4 weeks   OT Treatment/Interventions Therapeutic exercise;Neuromuscular education;DME and/or AE instruction;Therapeutic activities;Patient/family education   Plan check with patient on HEP given today, strengthening of R hand        Problem List Patient Active Problem List   Diagnosis Date Noted  . Keloid scar-Left neck post-op site 12/19/2013  . Tightness of neck-Left 12/19/2013  . Pain in limb-Left neck 12/19/2013  . CVA (cerebral infarction) 11/25/2013  . Occlusion and stenosis of carotid artery with cerebral infarction 11/25/2013  . Left sided numbness 11/25/2013  . Depression 11/25/2013  . Aftercare following surgery of the circulatory system, [place] 11/21/2013  . Occlusion and stenosis of carotid artery  without mention of cerebral infarction- Left 11/21/2013  . Follow-up examination, following unspecified surgery 11/21/2013  . Carotid stenosis 11/03/2013  . Parotid mass 11/02/2013  . Acute ischemic left MCA stroke 11/02/2013  . Type II or unspecified type diabetes mellitus with neurological manifestations, not stated as uncontrolled 11/02/2013  . CVA (cerebral vascular accident) 11/01/2013  . TIA (transient ischemic attack) 11/01/2013  . Unspecified vitamin D deficiency 10/19/2013  . Tooth pain 07/31/2013  . Tobacco abuse 07/31/2013  . Essential hypertension, benign 04/20/2013  . Type II or unspecified type diabetes mellitus 04/20/2013  . Unspecified hypothyroidism 04/20/2013  . Other and unspecified hyperlipidemia 04/20/2013                                             Forde Radon, MS, OTR/L 07/08/2014 5:10 PM Phone: [phone] Fax: [phone]   Sonia Skinner 07/08/2014, 5:09 PM

## 2014-07-09 ENCOUNTER — Ambulatory Visit: Payer: No Typology Code available for payment source | Admitting: Occupational Therapy

## 2014-07-09 ENCOUNTER — Encounter: Payer: Self-pay | Admitting: Occupational Therapy

## 2014-07-09 DIAGNOSIS — R279 Unspecified lack of coordination: Secondary | ICD-10-CM

## 2014-07-09 DIAGNOSIS — R278 Other lack of coordination: Secondary | ICD-10-CM

## 2014-07-09 DIAGNOSIS — M6281 Muscle weakness (generalized): Secondary | ICD-10-CM

## 2014-07-09 NOTE — Therapy (Signed)
Occupational Therapy Treatment  Patient Details  Name: Sonia Skinner MRN: [id number] Date of Birth: October 12, 1950  Encounter Date: 07/09/2014      OT End of Session - 07/09/14 1647    Visit Number 3   Number of Visits 8   Date for OT Re-Evaluation 07/30/14   OT Start Time 1616   OT Stop Time 1658   OT Time Calculation (min) 42 min   Activity Tolerance --      Past Medical History  Diagnosis Date  . Hypertension   . Thyroid disease   . Complication of anesthesia     ALLERY TO ESTER BASE  . Diabetes mellitus     INSULIN DEPENDENT  . GERD (gastroesophageal reflux disease)   . Carotid artery occlusion   . Stroke March 2015     left MCA infarct    Past Surgical History  Procedure Laterality Date  . Abdominal hysterectomy    . Endarterectomy Left 11/04/2013    Procedure: ENDARTERECTOMY CAROTID;  Surgeon: Angelia Mould, MD;  Location: [facility] OR;  Service: Vascular;  Laterality: Left;  . Carotid endarterectomy Left 11-04-13    cea    There were no vitals taken for this visit.  Visit Diagnosis:  Generalized muscle weakness  Decreased coordination      Subjective Assessment - 07/09/14 1639    Symptoms "My hand got a little sore doing the theraputty yesterday"   Pertinent History see epic snapshot   Currently in Pain? Yes   Pain Score 2    Pain Location Hand   Pain Orientation Left   Pain Descriptors / Indicators Aching   Aggravating Factors  theraputty exercises - instructed pt to do every other day initially   Pain Relieving Factors rest            OT Treatments/Exercises (OP) - 07/09/14 1650    Hand Exercises   Other Hand Exercises gripper with 35 pounds of resistance and picking up blocks   Fine Motor Coordination   Fine Motor Coordination In hand manipuation training   In Hand Manipulation Training key peg board, practice hand writing with AE to address legibility, coordintation and sustained hand activity, provided patient with coordination HEP and  completed all activities issued (see pt instruction). Patient with moderate fatique with activity and required rest breaks.            OT Education - 07/09/14 1641    Education provided Yes   Person(s) Educated Patient   Methods Explanation;Demonstration;Handout   Comprehension Verbalized understanding;Returned demonstration            OT Long Term Goals - 07/09/14 1642    OT LONG TERM GOAL #1   Title Pt will verbalize understanding of the risk factors and warning sign/symptoms of CVA - 07/30/2014 (date moved forward 4 weeks due to scheduling conflicts)   Status On-going   OT LONG TERM GOAL #2   Title Pt will demonstrate increased hand strength by at least 8 pounds to assist with functional activities.   Status On-going   OT LONG TERM GOAL #3   Title Pt will demonstrate ability to write at paragraph length legibly with AE prn   Status On-going          Plan - 07/09/14 1643    Clinical Impression Statement Pt provided with coordination exercises to address decreased coordination and to encourage sustained hand activity.  Pt also instructed to alternate theraputty program with coordination HEP until hand strength is built up.  Rehab Potential Good   Clinical Impairments Affecting Rehab Potential Pt will benefit from skilled OT services to address:  decreased coordination decreased strength R hand, decreased tolerance for sustained activity for R hand   OT Frequency 2x / week   OT Duration 4 weeks   OT Treatment/Interventions Therapeutic exercise;Neuromuscular education;DME and/or AE instruction;Therapeutic activities;Patient/family education   Plan check with patient on HEP for comfort and for coordination program.  Strengthening and writing        Problem List Patient Active Problem List   Diagnosis Date Noted  . Keloid scar-Left neck post-op site 12/19/2013  . Tightness of neck-Left 12/19/2013  . Pain in limb-Left neck 12/19/2013  . CVA (cerebral infarction)  11/25/2013  . Occlusion and stenosis of carotid artery with cerebral infarction 11/25/2013  . Left sided numbness 11/25/2013  . Depression 11/25/2013  . Aftercare following surgery of the circulatory system, [place] 11/21/2013  . Occlusion and stenosis of carotid artery without mention of cerebral infarction- Left 11/21/2013  . Follow-up examination, following unspecified surgery 11/21/2013  . Carotid stenosis 11/03/2013  . Parotid mass 11/02/2013  . Acute ischemic left MCA stroke 11/02/2013  . Type II or unspecified type diabetes mellitus with neurological manifestations, not stated as uncontrolled 11/02/2013  . CVA (cerebral vascular accident) 11/01/2013  . TIA (transient ischemic attack) 11/01/2013  . Unspecified vitamin D deficiency 10/19/2013  . Tooth pain 07/31/2013  . Tobacco abuse 07/31/2013  . Essential hypertension, benign 04/20/2013  . Type II or unspecified type diabetes mellitus 04/20/2013  . Unspecified hypothyroidism 04/20/2013  . Other and unspecified hyperlipidemia 04/20/2013                                             Forde Radon, MS, OTR/L 07/09/2014 4:58 PM Phone: [phone] Fax: [phone]   [name] 07/09/2014, 4:57 PM

## 2014-07-09 NOTE — Patient Instructions (Addendum)
  Coordination Activities  Typing Left Handed Words  were  wear  are  bear  bare  care  dear    deer  stare  react  dare  sad  gear  great    rate  date  red  read  fear  free  tea    tear  fast  waste  treat  tease  zest  wax    cast  vase  vast  cat  basket  earn  casket    bee  seat  ate  gas  rare  seed  tax    fax  feed  cart  art  tart  taste  ear    here    Perform activities 15-20 minutes 1-2  times per day with left hand(s).   Rotate ball in fingertips (clockwise and counter-clockwise).  Toss ball between hands.  Toss ball in air and catch with the same hand.  Juggle 2 balls.  Flip cards 1 at a time as fast as you can.  Deal cards with your thumb (Hold deck in hand and push card off top with thumb).  Rotate card in hand (clockwise and counter-clockwise).  Shuffle cards.  Pick up coins, buttons, marbles, dried beans/pasta of different sizes and place in container.  Pick up coins and place in container or coin bank.  Pick up coins and stack.  Practice writing and/or typing.   Can increase the challenge by: 1. Increasing time 2. Increasing speed 3. Increasing distance (ball) 4.  Increasing accuracy

## 2014-07-10 ENCOUNTER — Ambulatory Visit: Payer: No Typology Code available for payment source | Admitting: Podiatrist

## 2014-07-15 ENCOUNTER — Ambulatory Visit: Payer: No Typology Code available for payment source | Admitting: Occupational Therapy

## 2014-07-16 ENCOUNTER — Telehealth: Payer: Self-pay | Admitting: Internal Medicine

## 2014-07-16 ENCOUNTER — Other Ambulatory Visit: Payer: Self-pay | Admitting: Internal Medicine

## 2014-07-16 NOTE — Telephone Encounter (Signed)
Pt. Came into facility to request a refill for atorvastatin (LIPITOR) 40 MG tablet. Please f/u with pt.

## 2014-07-18 ENCOUNTER — Ambulatory Visit: Payer: No Typology Code available for payment source | Attending: Nurse Practitioner | Admitting: Occupational Therapy

## 2014-07-18 ENCOUNTER — Encounter: Payer: Self-pay | Admitting: Occupational Therapy

## 2014-07-18 DIAGNOSIS — I6529 Occlusion and stenosis of unspecified carotid artery: Secondary | ICD-10-CM | POA: Insufficient documentation

## 2014-07-18 DIAGNOSIS — R279 Unspecified lack of coordination: Secondary | ICD-10-CM

## 2014-07-18 DIAGNOSIS — M6281 Muscle weakness (generalized): Secondary | ICD-10-CM

## 2014-07-18 DIAGNOSIS — R278 Other lack of coordination: Secondary | ICD-10-CM

## 2014-07-18 DIAGNOSIS — Z5189 Encounter for other specified aftercare: Secondary | ICD-10-CM | POA: Insufficient documentation

## 2014-07-18 NOTE — Patient Instructions (Signed)
Add these exercises to your current theraputty home program:  Wrist stabilization exercises:  Hold wrist in neutral with palm facing downward but not resting on the table.  With your right hand, use two fingers to provide resistance in the following directions: 1.  Press down, hold for a SLOW count of 5, rest, repeat 2.  Press up, hold for a SLOW count of 5, rest, repeat 3.  Press on one side, hold for a SLOW count of 5, rest, repeat 4.  Press on other side, hold for a SLOW count of 5, rest, repeat.  Do 10 reps of each exercise.  Go slow and stop if you need to rest.  Reduce reps and/or resistance if you experience pain.  Provide resistance at the base or middle of your hand, not at the fingers.

## 2014-07-18 NOTE — Therapy (Signed)
[facility] [address] [place], [place], [address] Phone: [phone]   Fax:  [phone]  Occupational Therapy Treatment  Patient Details  Name: Sonia Skinner MRN: [id number] Date of Birth: 30-Jul-1951  Encounter Date: 07/18/2014      OT End of Session - 07/18/14 1659    Visit Number 4   Number of Visits 8   Date for OT Re-Evaluation 07/30/14   OT Start Time 1616   OT Stop Time 1657   OT Time Calculation (min) 41 min      Past Medical History  Diagnosis Date  . Hypertension   . Thyroid disease   . Complication of anesthesia     ALLERY TO ESTER BASE  . Diabetes mellitus     INSULIN DEPENDENT  . GERD (gastroesophageal reflux disease)   . Carotid artery occlusion   . Stroke March 2015     left MCA infarct    Past Surgical History  Procedure Laterality Date  . Abdominal hysterectomy    . Endarterectomy Left 11/04/2013    Procedure: ENDARTERECTOMY CAROTID;  Surgeon: Angelia Mould, MD;  Location: [facility] OR;  Service: Vascular;  Laterality: Left;  . Carotid endarterectomy Left 11-04-13    cea    There were no vitals taken for this visit.  Visit Diagnosis:  Generalized muscle weakness  Decreased coordination      Subjective Assessment - 07/18/14 1617    Symptoms "I didn't get  a chance to do any of the exercises since my last visit"   Pertinent History see epic snapshot   Currently in Pain? Yes   Pain Score 3    Pain Location Hand   Pain Orientation Left   Pain Descriptors / Indicators Sharp  side of wrist   Pain Type Acute pain   Pain Onset 1 to 4 weeks ago   Pain Frequency Intermittent   Aggravating Factors  increased writing with left hand    Pain Relieving Factors tylenol, half of a vicodan if it gets real bad            OT Treatments/Exercises (OP) - 07/18/14 1622    ADLs   ADL Comments Reviewed signs/symptoms of stroke as well as risk factors;  provided information in written format   Exercises   Exercises Hand   Hand Exercises   Other Hand Exercises wrist stabilization exercises given for flexion/extension/ulnar and radial deviation. 10 reps each.   Other Hand Exercises hand writing with AE to address sustained activity, coordination and legibility   Modalities   Modalities Fluidotherapy  x 12 min/no adverse reactions/decrease pain and stiffness          OT Education - 07/18/14 1658    Education provided Yes   Education Details Wrist stabilzation exercises, written info on signs/symptoms and risk factors of stroke   Person(s) Educated Patient   Methods Explanation;Demonstration   Comprehension Verbalized understanding;Returned demonstration            OT Long Term Goals - 07/18/14 1701    OT LONG TERM GOAL #1   Title Pt will verbalize understanding of the risk factors and warning sign/symptoms of CVA - 07/30/2014 (date moved forward 4 weeks due to scheduling conflicts)   Status On-going   OT LONG TERM GOAL #2   Title Pt will demonstrate increased hand strength by at least 8 pounds to assist with functional activities.   Status On-going   OT LONG TERM GOAL #3   Title Pt will  demonstrate ability to write at paragraph length legibly with AE prn   Status On-going          Plan - 07/18/14 1659    Clinical Impression Statement Pt with pain in r wrist from overuse at work today. Pain level reduced with fluido therapy and pt instructed in wrist stabilization exercises.  Pt encouraged to complete HEP consistently                                Problem List Patient Active Problem List   Diagnosis Date Noted  . Keloid scar-Left neck post-op site 12/19/2013  . Tightness of neck-Left 12/19/2013  . Pain in limb-Left neck 12/19/2013  . CVA (cerebral infarction) 11/25/2013  . Occlusion and stenosis of carotid artery with cerebral infarction 11/25/2013  . Left sided numbness 11/25/2013  . Depression 11/25/2013  . Aftercare following  surgery of the circulatory system, [place] 11/21/2013  . Occlusion and stenosis of carotid artery without mention of cerebral infarction- Left 11/21/2013  . Follow-up examination, following unspecified surgery 11/21/2013  . Carotid stenosis 11/03/2013  . Parotid mass 11/02/2013  . Acute ischemic left MCA stroke 11/02/2013  . Type II or unspecified type diabetes mellitus with neurological manifestations, not stated as uncontrolled 11/02/2013  . CVA (cerebral vascular accident) 11/01/2013  . TIA (transient ischemic attack) 11/01/2013  . Unspecified vitamin D deficiency 10/19/2013  . Tooth pain 07/31/2013  . Tobacco abuse 07/31/2013  . Essential hypertension, benign 04/20/2013  . Type II or unspecified type diabetes mellitus 04/20/2013  . Unspecified hypothyroidism 04/20/2013  . Other and unspecified hyperlipidemia 04/20/2013   Forde Radon, MS, OTR/L 07/18/2014 5:03 PM Phone: [phone] Fax: [phone]   [name] 07/18/2014, 5:03 PM

## 2014-07-22 ENCOUNTER — Encounter: Payer: Self-pay | Admitting: Occupational Therapy

## 2014-07-22 ENCOUNTER — Ambulatory Visit: Payer: No Typology Code available for payment source | Admitting: Occupational Therapy

## 2014-07-22 DIAGNOSIS — M6281 Muscle weakness (generalized): Secondary | ICD-10-CM

## 2014-07-22 DIAGNOSIS — R278 Other lack of coordination: Secondary | ICD-10-CM

## 2014-07-22 DIAGNOSIS — R279 Unspecified lack of coordination: Secondary | ICD-10-CM

## 2014-07-22 NOTE — Therapy (Signed)
[facility] [address] [place], [place], [address] Phone: [phone]   Fax:  [phone]  Occupational Therapy Treatment  Patient Details  Name: Sonia Skinner MRN: [id number] Date of Birth: [birth year]  Encounter Date: 07/22/2014      OT End of Session - 07/22/14 1652    Visit Number 5   Number of Visits 8   Date for OT Re-Evaluation 07/30/14   OT Start Time 1616   OT Stop Time 1700   OT Time Calculation (min) 44 min   Activity Tolerance Patient tolerated treatment well      Past Medical History  Diagnosis Date  . Hypertension   . Thyroid disease   . Complication of anesthesia     ALLERY TO ESTER BASE  . Diabetes mellitus     INSULIN DEPENDENT  . GERD (gastroesophageal reflux disease)   . Carotid artery occlusion   . Stroke March 2015     left MCA infarct    Past Surgical History  Procedure Laterality Date  . Abdominal hysterectomy    . Endarterectomy Left 11/04/2013    Procedure: ENDARTERECTOMY CAROTID;  Surgeon: Angelia Mould, MD;  Location: [facility] OR;  Service: Vascular;  Laterality: Left;  . Carotid endarterectomy Left 11-04-13    cea    There were no vitals taken for this visit.  Visit Diagnosis:  Generalized muscle weakness  Decreased coordination      Subjective Assessment - 07/22/14 1625    Symptoms "My blood sugar levels are dropping"  Pt given a coke to sip on.   Currently in Pain? Yes   Pain Score 5    Pain Location Hand  some radiation of pain up L arm   Pain Descriptors / Indicators Aching   Pain Type Acute pain   Pain Onset 1 to 4 weeks ago   Pain Frequency Intermittent   Aggravating Factors  increased use of left hand, especially writing   Pain Relieving Factors tylenol, half of a vicodan if it gets bad, heat.             OT Treatments/Exercises (OP) - 07/22/14 1647    Exercises   Exercises Hand   Hand Exercises   Other Hand Exercises Hand writing with AE at 2 paragraph  level. Pt required rest break between paragraphs.  Paragraphs legible with rest break and AE.  Grip strength today L= 65 pounds (was 50 upon eval) and is now equal in strength to RUE.  Pt is ambidextrous however uses R hand for most activities except writing.    Other Hand Exercises Reviewed wrist stabilization exercises- pt able to complete mod I without pain.   Modalities   Modalities Fluidotherapy  x12 min to decrease pain with good effect (5-3/10)              OT Long Term Goals - 07/22/14 1629    OT LONG TERM GOAL #1   Title Pt will verbalize understanding of the risk factors and warning sign/symptoms of CVA - 07/30/2014 (date moved forward 4 weeks due to scheduling conflicts)   Status On-going   OT LONG TERM GOAL #2   Title Pt will demonstrate increased hand strength by at least 8 pounds to assist with functional activities.   Status Achieved   OT LONG TERM GOAL #3   Title Pt will demonstrate ability to write at paragraph length legibly with AE prn   Status On-going          Plan -  07/22/14 1700    Clinical Impression Statement Pt making excellent progress toward LTG's however still c/o pain and fatigue in hand and arm.   Rehab Potential Good   Clinical Impairments Affecting Rehab Potential Pt will benefit from skilled OT services to address:  decreased coordination decreased strength R hand, decreased tolerance for sustained activity for R hand   OT Frequency 2x / week   OT Duration 4 weeks   OT Treatment/Interventions Therapeutic exercise;Neuromuscular education;DME and/or AE instruction;Therapeutic activities;Patient/family education                               Problem List Patient Active Problem List   Diagnosis Date Noted  . Keloid scar-Left neck post-op site 12/19/2013  . Tightness of neck-Left 12/19/2013  . Pain in limb-Left neck 12/19/2013  . CVA (cerebral infarction) 11/25/2013  . Occlusion and stenosis of carotid artery with  cerebral infarction 11/25/2013  . Left sided numbness 11/25/2013  . Depression 11/25/2013  . Aftercare following surgery of the circulatory system, [place] 11/21/2013  . Occlusion and stenosis of carotid artery without mention of cerebral infarction- Left 11/21/2013  . Follow-up examination, following unspecified surgery 11/21/2013  . Carotid stenosis 11/03/2013  . Parotid mass 11/02/2013  . Acute ischemic left MCA stroke 11/02/2013  . Type II or unspecified type diabetes mellitus with neurological manifestations, not stated as uncontrolled 11/02/2013  . CVA (cerebral vascular accident) 11/01/2013  . TIA (transient ischemic attack) 11/01/2013  . Unspecified vitamin D deficiency 10/19/2013  . Tooth pain 07/31/2013  . Tobacco abuse 07/31/2013  . Essential hypertension, benign 04/20/2013  . Type II or unspecified type diabetes mellitus 04/20/2013  . Unspecified hypothyroidism 04/20/2013  . Other and unspecified hyperlipidemia 04/20/2013   Forde Radon, MS, OTR/L 07/22/2014 5:04 PM Phone: [phone] Fax: [phone]   Sonia Skinner 07/22/2014, 5:03 PM

## 2014-07-25 ENCOUNTER — Encounter: Payer: Self-pay | Admitting: Occupational Therapy

## 2014-07-25 ENCOUNTER — Ambulatory Visit: Payer: No Typology Code available for payment source | Admitting: Occupational Therapy

## 2014-07-25 DIAGNOSIS — M6281 Muscle weakness (generalized): Secondary | ICD-10-CM

## 2014-07-25 DIAGNOSIS — R279 Unspecified lack of coordination: Secondary | ICD-10-CM

## 2014-07-25 DIAGNOSIS — R278 Other lack of coordination: Secondary | ICD-10-CM

## 2014-07-25 NOTE — Therapy (Signed)
[facility] [address] [place], [place], [address] Phone: [phone]   Fax:  [phone]  Occupational Therapy Treatment  Patient Details  Name: Sonia Skinner MRN: [id number] Date of Birth: January 23, 1951  Encounter Date: 07/25/2014      OT End of Session - 07/25/14 1719    Visit Number 6   Number of Visits 8   Date for OT Re-Evaluation 07/30/14   OT Start Time 1617   OT Stop Time 1700   OT Time Calculation (min) 43 min   Activity Tolerance Patient tolerated treatment well   Behavior During Therapy [facility] for tasks assessed/performed      Past Medical History  Diagnosis Date  . Hypertension   . Thyroid disease   . Complication of anesthesia     ALLERY TO ESTER BASE  . Diabetes mellitus     INSULIN DEPENDENT  . GERD (gastroesophageal reflux disease)   . Carotid artery occlusion   . Stroke March 2015     left MCA infarct    Past Surgical History  Procedure Laterality Date  . Abdominal hysterectomy    . Endarterectomy Left 11/04/2013    Procedure: ENDARTERECTOMY CAROTID;  Surgeon: Angelia Mould, MD;  Location: [facility] OR;  Service: Vascular;  Laterality: Left;  . Carotid endarterectomy Left 11-04-13    cea    There were no vitals taken for this visit.  Visit Diagnosis:  Generalized muscle weakness  Decreased coordination      Subjective Assessment - 07/25/14 1711    Symptoms "I had to do a lot of handwriting at work today."   Pertinent History see epic snapshot   Currently in Pain? Yes   Pain Score 3    Pain Location Hand   Pain Orientation Left   Pain Descriptors / Indicators Aching   Pain Type Acute pain   Pain Onset 1 to 4 weeks ago   Pain Frequency Intermittent   Aggravating Factors  Increases with long periods of handwriting   Pain Relieving Factors heat   Multiple Pain Sites No            OT Treatments/Exercises (OP) - 07/25/14 0001    Hand Exercises   Other Hand Exercises Gripper at 35 lbs  resistance with emphasis on sustaining grasp   Other Hand Exercises Handwriting at 2 paragraph level with emphasis on reducing pressure between thumb and index finger              OT Long Term Goals - 07/25/14 1723    OT LONG TERM GOAL #1   Title Pt will verbalize understanding of the risk factors and warning sign/symptoms of CVA - 07/30/2014 (date moved forward 4 weeks due to scheduling conflicts)   Status On-going   OT LONG TERM GOAL #2   Title Pt will demonstrate increased hand strength by at least 8 pounds to assist with functional activities.   Status Achieved   OT LONG TERM GOAL #3   Title Pt will demonstrate ability to write at paragraph length legibly with AE prn   Status On-going          Plan - 07/25/14 1720    Clinical Impression Statement Patient will continue to benefit from skilled OT intervention to address pain and endurance as related to work skills (handwriting) with left upper extremity   Rehab Potential Good   Clinical Impairments Affecting Rehab Potential Pt will benefit from skilled OT services to address:  decreased coordination decreased strength R hand,  decreased tolerance for sustained activity for R hand   OT Frequency 2x / week   OT Duration 4 weeks   OT Treatment/Interventions Therapeutic exercise;Neuromuscular education;DME and/or AE instruction;Therapeutic activities;Patient/family education   Plan Patient is using theraputty, and continuing to work on improving endurance with handwriting.  Prepare patient for discharge.    Consulted and Agree with Plan of Care Patient                               Problem List Patient Active Problem List   Diagnosis Date Noted  . Keloid scar-Left neck post-op site 12/19/2013  . Tightness of neck-Left 12/19/2013  . Pain in limb-Left neck 12/19/2013  . CVA (cerebral infarction) 11/25/2013  . Occlusion and stenosis of carotid artery with cerebral infarction 11/25/2013  . Left sided  numbness 11/25/2013  . Depression 11/25/2013  . Aftercare following surgery of the circulatory system, [place] 11/21/2013  . Occlusion and stenosis of carotid artery without mention of cerebral infarction- Left 11/21/2013  . Follow-up examination, following unspecified surgery 11/21/2013  . Carotid stenosis 11/03/2013  . Parotid mass 11/02/2013  . Acute ischemic left MCA stroke 11/02/2013  . Type II or unspecified type diabetes mellitus with neurological manifestations, not stated as uncontrolled 11/02/2013  . CVA (cerebral vascular accident) 11/01/2013  . TIA (transient ischemic attack) 11/01/2013  . Unspecified vitamin D deficiency 10/19/2013  . Tooth pain 07/31/2013  . Tobacco abuse 07/31/2013  . Essential hypertension, benign 04/20/2013  . Type II or unspecified type diabetes mellitus 04/20/2013  . Unspecified hypothyroidism 04/20/2013  . Other and unspecified hyperlipidemia 04/20/2013   Antony Salmon, OTR/L 07/25/2014 5:24 PM Phone: [phone] Fax: [phone]   Sonia Skinner 07/25/2014, 5:24 PM

## 2014-07-27 ENCOUNTER — Encounter: Payer: Self-pay | Admitting: Internal Medicine

## 2014-07-29 ENCOUNTER — Encounter: Payer: Self-pay | Admitting: Occupational Therapy

## 2014-07-29 ENCOUNTER — Ambulatory Visit: Payer: No Typology Code available for payment source | Admitting: Occupational Therapy

## 2014-07-29 DIAGNOSIS — R279 Unspecified lack of coordination: Secondary | ICD-10-CM

## 2014-07-29 DIAGNOSIS — M6281 Muscle weakness (generalized): Secondary | ICD-10-CM

## 2014-07-29 DIAGNOSIS — R278 Other lack of coordination: Secondary | ICD-10-CM

## 2014-07-29 NOTE — Patient Instructions (Signed)
Add these theraputty exercises to the ones you are currently doing. Do each exercise 10 times. Do the entire series 1-2 times per day.  Stop if you get pain and let your therapist know.  You have a sheet with pictures to remind you how to do these!  1. Intrinsic function - PULL TAFFY 2. Thumb Palmar Abduction - ring around your fingers and stretch your hand wide open. 3. Individual Pinch - break off smaller piece of theraputty.  Make a ball.  Squeeze between index and thumb 4. Lateral Pinch - break off smaller piece of theraputty.  Make a ball. Hold like a key and squeeze. 5  3 Jaw Chuck Pinch - make a hot dog.  Squeeze between your index finger, middle finger and thumb.

## 2014-07-29 NOTE — Therapy (Signed)
[facility] [address] [place], [place], [address] Phone: [phone]   Fax:  [phone]  Occupational Therapy Treatment  Patient Details  Name: Sonia Skinner MRN: [id number] Date of Birth: Apr 13, 1951  Encounter Date: 07/29/2014      OT End of Session - 07/29/14 1655    Visit Number 7   Number of Visits 8   Date for OT Re-Evaluation 07/30/14   OT Start Time 1616   OT Stop Time 1658   OT Time Calculation (min) 42 min   Activity Tolerance Patient tolerated treatment well      Past Medical History  Diagnosis Date  . Hypertension   . Thyroid disease   . Complication of anesthesia     ALLERY TO ESTER BASE  . Diabetes mellitus     INSULIN DEPENDENT  . GERD (gastroesophageal reflux disease)   . Carotid artery occlusion   . Stroke March 2015     left MCA infarct    Past Surgical History  Procedure Laterality Date  . Abdominal hysterectomy    . Endarterectomy Left 11/04/2013    Procedure: ENDARTERECTOMY CAROTID;  Surgeon: Angelia Mould, MD;  Location: [facility] OR;  Service: Vascular;  Laterality: Left;  . Carotid endarterectomy Left 11-04-13    cea    There were no vitals taken for this visit.  Visit Diagnosis:  Generalized muscle weakness  Decreased coordination      Subjective Assessment - 07/29/14 1616    Symptoms "I have pain like a toothache in my upper arm I think because I am using it more."   Pertinent History see epic snapshot   Currently in Pain? Yes   Pain Score 3    Pain Location Arm   Pain Orientation Left   Pain Descriptors / Indicators Aching   Pain Onset Today   Pain Frequency Constant   Aggravating Factors  overuse   Pain Relieving Factors heat, rest   Multiple Pain Sites No            OT Treatments/Exercises (OP) - 07/29/14 0001    Exercises   Exercises Hand   Hand Exercises   Theraputty Pinch;Roll;Grip  also finger extension   Other Hand Exercises Hand writing with AE (one has  been orderd for patient).  Patient able to write at 2 paragraph length with AE with 100% legibility.  Pt states her hand still gets tired but she is able to do this now.   Modalities   Modalities Moist Heat   Moist Heat Therapy   Number Minutes Moist Heat 10 Minutes   Moist Heat Location Shoulder  good relief; pt reported only a 2/10 after moist heat          OT Education - 07/29/14 1651    Person(s) Educated Patient   Methods Explanation;Demonstration;Handout   Comprehension Verbalized understanding;Returned demonstration            OT Long Term Goals - 07/29/14 1653    OT LONG TERM GOAL #1   Title Pt will verbalize understanding of the risk factors and warning sign/symptoms of CVA - 07/30/2014 (date moved forward 4 weeks due to scheduling conflicts)   Status Achieved   OT LONG TERM GOAL #2   Title Pt will demonstrate increased hand strength by at least 8 pounds to assist with functional activities.   Status Achieved   OT LONG TERM GOAL #3   Title Pt will demonstrate ability to write at paragraph length legibly with AE prn  Status Achieved   OT LONG TERM GOAL #4   Title Pt will be I with ugraded HEP and verbalize understanding of how to upgrade program as she progresses at home.   Status New          Plan - 07/29/14 1652    Clinical Impression Statement Pt continues to make good progress toward LTG's.  Pt has met all but one LTG.  Recommend using heat prn at home for aching shoulder.     Rehab Potential Good   Clinical Impairments Affecting Rehab Potential Pt will benefit from skilled OT services to address:  decreased coordination decreased strength R hand, decreased tolerance for sustained activity for R hand   OT Frequency 2x / week   OT Duration 4 weeks   OT Treatment/Interventions Therapeutic exercise;Neuromuscular education;DME and/or AE instruction;Therapeutic activities;Patient/family education   Plan finalize HEP; teach pt how to upgrade as she progresses at  home.  D/C from OT services.                               Problem List Patient Active Problem List   Diagnosis Date Noted  . Keloid scar-Left neck post-op site 12/19/2013  . Tightness of neck-Left 12/19/2013  . Pain in limb-Left neck 12/19/2013  . CVA (cerebral infarction) 11/25/2013  . Occlusion and stenosis of carotid artery with cerebral infarction 11/25/2013  . Left sided numbness 11/25/2013  . Depression 11/25/2013  . Aftercare following surgery of the circulatory system, [place] 11/21/2013  . Occlusion and stenosis of carotid artery without mention of cerebral infarction- Left 11/21/2013  . Follow-up examination, following unspecified surgery 11/21/2013  . Carotid stenosis 11/03/2013  . Parotid mass 11/02/2013  . Acute ischemic left MCA stroke 11/02/2013  . Type II or unspecified type diabetes mellitus with neurological manifestations, not stated as uncontrolled 11/02/2013  . CVA (cerebral vascular accident) 11/01/2013  . TIA (transient ischemic attack) 11/01/2013  . Unspecified vitamin D deficiency 10/19/2013  . Tooth pain 07/31/2013  . Tobacco abuse 07/31/2013  . Essential hypertension, benign 04/20/2013  . Type II or unspecified type diabetes mellitus 04/20/2013  . Unspecified hypothyroidism 04/20/2013  . Other and unspecified hyperlipidemia 04/20/2013   Forde Radon, MS, OTR/L 07/29/2014 5:00 PM Phone: [phone] Fax: [phone]    [name] 07/29/2014, 4:59 PM

## 2014-07-30 ENCOUNTER — Other Ambulatory Visit (HOSPITAL_COMMUNITY): Payer: No Typology Code available for payment source

## 2014-07-31 ENCOUNTER — Other Ambulatory Visit: Payer: Self-pay | Admitting: Emergency Medicine

## 2014-07-31 MED ORDER — ATORVASTATIN CALCIUM 40 MG PO TABS: 40.0000 mg | ORAL_TABLET | Freq: Every day | ORAL | Status: DC

## 2014-08-01 ENCOUNTER — Ambulatory Visit: Payer: No Typology Code available for payment source | Admitting: Occupational Therapy

## 2014-08-01 ENCOUNTER — Encounter: Payer: Self-pay | Admitting: Occupational Therapy

## 2014-08-01 DIAGNOSIS — R278 Other lack of coordination: Secondary | ICD-10-CM

## 2014-08-01 DIAGNOSIS — M6281 Muscle weakness (generalized): Secondary | ICD-10-CM

## 2014-08-01 DIAGNOSIS — R279 Unspecified lack of coordination: Secondary | ICD-10-CM

## 2014-08-01 NOTE — Patient Instructions (Signed)
Home Exercise Program includes;  1. Theraputty exercises using red putty  2.  Coordination exercises  3.  Typing activity (Left handed words)  4.  Wrist stabilization exercises  How to increase your challenge as your hand gets stronger:  1.  Transition from the red putting to the green putty.  You can do this by first starting to dig objects out of the green, then doing 1-2 exercises with the green and the rest with the red, then doing all exercises with the green one day and then red the next day working up to doing all the exercises with the red putty.  2.  Increase the number of repetitions of an exercise - don't increase all of them on the same day just pick a few.  3. Increase the amount of time you spend on your exercises (i.e currently doing 15-20 minutes of coordination activities and can upgrade to 25 minutes).  4. Increase speed with coordination activities but pay attention to accuracy.    5. Change up the activities that you are doing to create a different demand.  Reduce what you are doing if you feel pain. Do not increase several things at once - pick one or two things to upgrade It is ok to alternate days - do more challenging one day and less challenging the next day.

## 2014-08-01 NOTE — Therapy (Signed)
[facility] [address] [place], [place], [address] Phone: [phone]   Fax:  [phone]  Occupational Therapy Treatment  Patient Details  Name: Sonia Skinner MRN: [id number] Date of Birth: 07-08-1951  Encounter Date: 08/01/2014      OT End of Session - 08/01/14 1658    Visit Number 8   Number of Visits 8   Date for OT Re-Evaluation 07/30/14   OT Start Time 1616   OT Stop Time 1701   OT Time Calculation (min) 45 min   Activity Tolerance Patient tolerated treatment well      Past Medical History  Diagnosis Date  . Hypertension   . Thyroid disease   . Complication of anesthesia     ALLERY TO ESTER BASE  . Diabetes mellitus     INSULIN DEPENDENT  . GERD (gastroesophageal reflux disease)   . Carotid artery occlusion   . Stroke March 2015     left MCA infarct    Past Surgical History  Procedure Laterality Date  . Abdominal hysterectomy    . Endarterectomy Left 11/04/2013    Procedure: ENDARTERECTOMY CAROTID;  Surgeon: Chuck Hint, MD;  Location: [facility] OR;  Service: Vascular;  Laterality: Left;  . Carotid endarterectomy Left 11-04-13    cea    There were no vitals taken for this visit.  Visit Diagnosis:  Generalized muscle weakness  Decreased coordination      Subjective Assessment - 08/01/14 1619    Symptoms "I am doing pretty well today"   Pertinent History see epic snapshot   Currently in Pain? No/denies                 OT Treatments/Exercises (OP) - 08/01/14 1652    ADLs   Writing Writing with AE (red gripper) at 2 paragraph length after strengthening exercises with 100% legibility   Exercises   Exercises Hand   Hand Exercises   Theraputty Locate Pegs;Roll;Grip  in green theraputty   Other Hand Exercises Pt able to do limited activity with green putty. Issued to pt to allow her to upgrade exercises at home as her hand continues to improve.  Reviewed HEP and provided  several ways in writing for pt to upgrade HEP on her own.                OT Education - 08/01/14 1654    Person(s) Educated Patient   Methods Explanation;Demonstration;Handout   Comprehension Verbalized understanding;Returned demonstration             OT Long Term Goals - 08/01/14 1655    OT LONG TERM GOAL #1   Title Pt will verbalize understanding of the risk factors and warning sign/symptoms of CVA - 07/30/2014 (date moved forward 4 weeks due to scheduling conflicts)   Status Achieved   OT LONG TERM GOAL #2   Title Pt will demonstrate increased hand strength by at least 8 pounds to assist with functional activities.   Status Achieved   OT LONG TERM GOAL #3   Title Pt will demonstrate ability to write at paragraph length legibly with AE prn   Status Achieved   OT LONG TERM GOAL #4   Title Pt will be I with ugraded HEP and verbalize understanding of how to upgrade program as she progresses at home.   Status Achieved               Plan - 08/01/14 1655    Clinical Impression Statement Pt  has met all LTG's and is ready for discharge from OT services.  Pt in agreement with plan   Rehab Potential Good   Clinical Impairments Affecting Rehab Potential Pt will benefit from skilled OT services to address:  decreased coordination decreased strength R hand, decreased tolerance for sustained activity for R hand   OT Frequency 2x / week   OT Duration 4 weeks   OT Treatment/Interventions Therapeutic exercise;Neuromuscular education;DME and/or AE instruction;Therapeutic activities;Patient/family education   Plan discharge from OT services        Problem List Patient Active Problem List   Diagnosis Date Noted  . Keloid scar-Left neck post-op site 12/19/2013  . Tightness of neck-Left 12/19/2013  . Pain in limb-Left neck 12/19/2013  . CVA (cerebral infarction) 11/25/2013  . Occlusion and stenosis of carotid artery with cerebral infarction 11/25/2013  . Left sided  numbness 11/25/2013  . Depression 11/25/2013  . Aftercare following surgery of the circulatory system, [place] 11/21/2013  . Occlusion and stenosis of carotid artery without mention of cerebral infarction- Left 11/21/2013  . Follow-up examination, following unspecified surgery 11/21/2013  . Carotid stenosis 11/03/2013  . Parotid mass 11/02/2013  . Acute ischemic left MCA stroke 11/02/2013  . Type II or unspecified type diabetes mellitus with neurological manifestations, not stated as uncontrolled 11/02/2013  . CVA (cerebral vascular accident) 11/01/2013  . TIA (transient ischemic attack) 11/01/2013  . Unspecified vitamin D deficiency 10/19/2013  . Tooth pain 07/31/2013  . Tobacco abuse 07/31/2013  . Essential hypertension, benign 04/20/2013  . Type II or unspecified type diabetes mellitus 04/20/2013  . Unspecified hypothyroidism 04/20/2013  . Other and unspecified hyperlipidemia 04/20/2013   OCCUPATIONAL THERAPY DISCHARGE SUMMARY  Visits from Start of Care: 8  Current functional level related to goals / functional outcomes: See goal status above   Remaining deficits: 1.  Intermittent pain with overuse 2.  Fatigue in hand with significant use   Education / Equipment: HEP Plan: Patient agrees to discharge.  Patient goals were met. Patient is being discharged due to meeting the stated rehab goals.  ?????     Forde Radon, MS, OTR/L 08/01/2014 5:07 PM Phone: [phone] Fax: [phone]    [name] 08/01/2014, 5:05 PM  [facility] [address] [place], [place], [address] Phone: [phone]   Fax:  [phone]

## 2014-08-05 ENCOUNTER — Ambulatory Visit: Payer: No Typology Code available for payment source | Admitting: Occupational Therapy

## 2014-08-08 ENCOUNTER — Other Ambulatory Visit: Payer: Self-pay | Admitting: Internal Medicine

## 2014-08-08 ENCOUNTER — Other Ambulatory Visit: Payer: Self-pay

## 2014-08-08 MED ORDER — BUPROPION HCL 100 MG PO TABS: ORAL_TABLET | ORAL | Status: DC

## 2014-08-08 MED ORDER — BUPROPION HCL ER (SR) 150 MG PO TB12: 150.0000 mg | ORAL_TABLET | Freq: Two times a day (BID) | ORAL | Status: DC

## 2014-08-14 ENCOUNTER — Ambulatory Visit: Payer: No Typology Code available for payment source | Admitting: Podiatrist

## 2014-08-14 ENCOUNTER — Ambulatory Visit: Payer: No Typology Code available for payment source | Admitting: Vascular Surgery

## 2014-08-15 ENCOUNTER — Telehealth: Payer: Self-pay | Admitting: Neurology

## 2014-08-15 DIAGNOSIS — R413 Other amnesia: Secondary | ICD-10-CM

## 2014-08-15 NOTE — Telephone Encounter (Signed)
I called patient. The patient has had a stroke, and she reports some issues with cognitive processing. She is interested in neuropsychological testing, I will try to get this set up.

## 2014-08-15 NOTE — Telephone Encounter (Signed)
Pt is requesting a referral to see Dr. Valentina Shaggy for PT.  Please call and advise.

## 2014-08-15 NOTE — Telephone Encounter (Signed)
WID please advise 

## 2014-09-02 ENCOUNTER — Other Ambulatory Visit: Payer: Self-pay | Admitting: Emergency Medicine

## 2014-09-02 MED ORDER — LISINOPRIL-HYDROCHLOROTHIAZIDE 20-25 MG PO TABS: 1.0000 | ORAL_TABLET | Freq: Every day | ORAL | Status: DC

## 2014-09-13 ENCOUNTER — Other Ambulatory Visit: Payer: Self-pay | Admitting: Internal Medicine

## 2014-09-17 ENCOUNTER — Encounter: Payer: Self-pay | Admitting: Internal Medicine

## 2014-09-17 ENCOUNTER — Ambulatory Visit: Payer: Medicare Other | Attending: Internal Medicine | Admitting: Internal Medicine

## 2014-09-17 VITALS — BP 149/73 | HR 89 | Temp 98.6°F | Resp 16 | Ht 62.0 in | Wt 156.0 lb

## 2014-09-17 DIAGNOSIS — E119 Type 2 diabetes mellitus without complications: Secondary | ICD-10-CM

## 2014-09-17 DIAGNOSIS — Z9119 Patient's noncompliance with other medical treatment and regimen: Secondary | ICD-10-CM | POA: Insufficient documentation

## 2014-09-17 DIAGNOSIS — E785 Hyperlipidemia, unspecified: Secondary | ICD-10-CM

## 2014-09-17 DIAGNOSIS — E11649 Type 2 diabetes mellitus with hypoglycemia without coma: Secondary | ICD-10-CM | POA: Insufficient documentation

## 2014-09-17 LAB — GLUCOSE, POCT (MANUAL RESULT ENTRY): POC Glucose: 194 mg/dl — AB (ref 70–99)

## 2014-09-17 LAB — POCT GLYCOSYLATED HEMOGLOBIN (HGB A1C): Hemoglobin A1C: 10

## 2014-09-17 MED ORDER — INSULIN GLARGINE 100 UNIT/ML ~~LOC~~ SOLN: SUBCUTANEOUS | Status: DC

## 2014-09-17 NOTE — Progress Notes (Signed)
Patient ID: Sonia Skinner, female   DOB: 10-04-1950, 64 y.o.   MRN: [id number] SUBJECTIVE: 64 y.o. female for follow up of diabetes. Diabetic Review of Systems - medication compliance: noncompliant some of the time, diabetic diet compliance: noncompliant much of the time, home glucose monitoring: is performed regularly.  Other symptoms and concerns: In October, November numbers were all 180 and below, with a few low sugar levels.  In [place] patient began to have blood sugars in the 300-400's.  She reports that she began to not take her medication as prescribed and eat several things that she know she was not suppose to eat.  She reports that she sometimes takes 40 units of Lantus twice per day and use the Byetta once per day.   Current Outpatient Prescriptions  Medication Sig Dispense Refill  . amLODipine (NORVASC) 5 MG tablet TAKE 1 TABLET BY MOUTH DAILY. 30 tablet 3  . aspirin 325 MG tablet Take 1 tablet (325 mg total) by mouth daily.    Marland Kitchen atorvastatin (LIPITOR) 40 MG tablet Take 1 tablet (40 mg total) by mouth daily at 6 PM. 30 tablet 2  . buPROPion (WELLBUTRIN SR) 150 MG 12 hr tablet Take 1 tablet (150 mg total) by mouth 2 (two) times daily. Take 2 tables in the morning and one tablet at night 90 tablet 3  . exenatide (BYETTA) 10 MCG/0.04ML SOPN injection Inject 0.02 mLs (5 mcg total) into the skin 2 (two) times daily with a meal. 2.4 mL 3  . HYDROcodone-acetaminophen (NORCO/VICODIN) 5-325 MG per tablet Take 1-2 tablets by mouth every 6 (six) hours as needed for moderate pain. 30 tablet 0  . insulin glargine (LANTUS) 100 UNIT/ML injection Take 60 units in the morning and 20 units at bedtime 10 mL 5  . levothyroxine (SYNTHROID, LEVOTHROID) 125 MCG tablet Take 1 tablet (125 mcg total) by mouth daily. 90 tablet 3  . lisinopril-hydrochlorothiazide (PRINZIDE,ZESTORETIC) 20-25 MG per tablet Take 1 tablet by mouth daily. 30 tablet 0  . loratadine (CLARITIN) 10 MG tablet Take 1 tablet (10 mg total) by mouth  daily. 30 tablet 0  . omeprazole (PRILOSEC) 40 MG capsule TAKE 1 CAPSULE BY MOUTH ONCE DAILY. 30 capsule 3  . acetaminophen (TYLENOL) 325 MG tablet Take 650 mg by mouth every 6 (six) hours as needed for headache.    . docusate sodium 100 MG CAPS Take 100 mg by mouth daily. (Patient not taking: Reported on 09/17/2014) 10 capsule 0  . nicotine (NICODERM CQ - DOSED IN MG/24 HOURS) 14 mg/24hr patch Place 1 patch (14 mg total) onto the skin daily. 28 patch 0  . terbinafine (LAMISIL) 250 MG tablet Take 1 tablet (250 mg total) by mouth daily. (Patient not taking: Reported on 09/17/2014) 90 tablet 0   No current facility-administered medications for this visit.    OBJECTIVE: Appearance: alert, well appearing, and in no distress and oriented to person, place, and time. BP 149/73 mmHg  Pulse 89  Temp(Src) 98.6 F (37 C) (Oral)  Resp 16  Ht 5\' 2"  (1.575 m)  Wt 156 lb (70.761 kg)  BMI 28.53 kg/m2  SpO2 97%  Exam: heart sounds normal rate, regular rhythm, normal S1, S2, no murmurs, rubs, clicks or gallops, chest clear, no carotid bruits  ASSESSMENT: Diabetes Mellitus: poorly controlled, patient poorly compliant and needs to follow regimen as prescribed.    PLAN: See orders for this visit as documented in the electronic medical record. Issues reviewed with her: diabetic diet discussed in detail,  written exchange diet given, all medications, side effects and compliance discussed carefully, long term diabetic complications discussed.  Patient to to decrease Lantus to 25 units once per day and use Byetta 5 mcg twice per day.  She will then report back to clinic in exactly two weeks for review.  Hypoglycemia addressed and how to treat.  She will return to clinic tomorrow for fasting labs.  I explained to patient that I will get her controlled if she follows my instructions and follow her diabetic diet.   Return in about 1 day (around 09/18/2014) for Lab Visit and 2 week RN-CBG log .  Chari Manning,  NP 09/17/2014 6:16 PM

## 2014-09-17 NOTE — Progress Notes (Signed)
Pt comes in to f/u with hTN, DM with med management States she is compliant with taking medication daily C/o Frequent headaches Pt has CBG log book with her

## 2014-09-18 ENCOUNTER — Ambulatory Visit: Payer: Medicare Other | Attending: Internal Medicine

## 2014-09-18 DIAGNOSIS — E785 Hyperlipidemia, unspecified: Secondary | ICD-10-CM

## 2014-09-18 DIAGNOSIS — E119 Type 2 diabetes mellitus without complications: Secondary | ICD-10-CM

## 2014-09-18 LAB — LIPID PANEL
Cholesterol: 184 mg/dL (ref 0–200)
HDL: 50 mg/dL (ref >39)
LDL Cholesterol: 105 mg/dL — ABNORMAL HIGH (ref 0–99)
Total CHOL/HDL Ratio: 3.7 Ratio
Triglycerides: 147 mg/dL (ref <150)
VLDL: 29 mg/dL (ref 0–40)

## 2014-09-18 LAB — COMPLETE METABOLIC PANEL WITH GFR
ALT: 17 U/L (ref 0–35)
AST: 12 U/L (ref 0–37)
Albumin: 4.1 g/dL (ref 3.5–5.2)
Alkaline Phosphatase: 143 U/L — ABNORMAL HIGH (ref 39–117)
BUN: 17 mg/dL (ref 6–23)
CO2: 27 mEq/L (ref 19–32)
Calcium: 9.4 mg/dL (ref 8.4–10.5)
Chloride: 99 mEq/L (ref 96–112)
Creat: 1.23 mg/dL — ABNORMAL HIGH (ref 0.50–1.10)
GFR, Est African American: 53 mL/min — ABNORMAL LOW
GFR, Est Non African American: 46 mL/min — ABNORMAL LOW
Glucose, Bld: 141 mg/dL — ABNORMAL HIGH (ref 70–99)
Potassium: 4.1 mEq/L (ref 3.5–5.3)
Sodium: 140 mEq/L (ref 135–145)
Total Bilirubin: 0.4 mg/dL (ref 0.2–1.2)
Total Protein: 7 g/dL (ref 6.0–8.3)

## 2014-09-20 ENCOUNTER — Telehealth: Payer: Self-pay | Admitting: *Deleted

## 2014-09-20 NOTE — Telephone Encounter (Signed)
-----   Message from Lance Bosch, NP sent at 09/19/2014  9:07 PM EST ----- Cholesterol is improved but she still needs to take medication because she is slightly above the normal range for bad cholesterol. Kidney function is declining. She will need to avoid NSAID's such as ibuproven, aleve, motrin, advil. This could possible be a result of uncontrolled diabetes taking a toll on her kidneys,

## 2014-09-20 NOTE — Telephone Encounter (Signed)
LVM to return call.

## 2014-10-17 ENCOUNTER — Other Ambulatory Visit: Payer: Self-pay | Admitting: Podiatrist

## 2014-10-18 ENCOUNTER — Ambulatory Visit: Payer: Medicare Other | Attending: Internal Medicine | Admitting: *Deleted

## 2014-10-18 VITALS — BP 118/69 | HR 89 | Temp 98.3°F | Resp 18

## 2014-10-18 DIAGNOSIS — E1165 Type 2 diabetes mellitus with hyperglycemia: Secondary | ICD-10-CM

## 2014-10-18 DIAGNOSIS — Z794 Long term (current) use of insulin: Secondary | ICD-10-CM | POA: Insufficient documentation

## 2014-10-18 DIAGNOSIS — E119 Type 2 diabetes mellitus without complications: Secondary | ICD-10-CM

## 2014-10-18 LAB — GLUCOSE, POCT (MANUAL RESULT ENTRY): POC Glucose: 256 mg/dl — AB (ref 70–99)

## 2014-10-18 MED ORDER — TRAMADOL HCL 50 MG PO TABS: 50.0000 mg | ORAL_TABLET | Freq: Two times a day (BID) | ORAL | Status: DC

## 2014-10-18 MED ORDER — INSULIN GLARGINE 100 UNIT/ML ~~LOC~~ SOLN: 28.0000 [IU] | Freq: Every day | SUBCUTANEOUS | Status: DC

## 2014-10-18 NOTE — Telephone Encounter (Signed)
I left a message for patient to call and schedule an appointment with Dr. Valentina Lucks.  Will need follow-up before can get a refill for Lamisil.

## 2014-10-18 NOTE — Patient Instructions (Signed)
DASH Eating Plan DASH stands for "Dietary Approaches to Stop Hypertension." The DASH eating plan is a healthy eating plan that has been shown to reduce high blood pressure (hypertension). Additional health benefits may include reducing the risk of type 2 diabetes mellitus, heart disease, and stroke. The DASH eating plan may also help with weight loss. WHAT DO I NEED TO KNOW ABOUT THE DASH EATING PLAN? For the DASH eating plan, you will follow these general guidelines:  Choose foods with a percent daily value for sodium of less than 5% (as listed on the food label).  Use salt-free seasonings or herbs instead of table salt or sea salt.  Check with your health care provider or pharmacist before using salt substitutes.  Eat lower-sodium products, often labeled as "lower sodium" or "no salt added."  Eat fresh foods.  Eat more vegetables, fruits, and low-fat dairy products.  Choose whole grains. Look for the word "whole" as the first word in the ingredient list.  Choose fish and skinless chicken or turkey more often than red meat. Limit fish, poultry, and meat to 6 oz (170 g) each day.  Limit sweets, desserts, sugars, and sugary drinks.  Choose heart-healthy fats.  Limit cheese to 1 oz (28 g) per day.  Eat more home-cooked food and less restaurant, buffet, and fast food.  Limit fried foods.  Cook foods using methods other than frying.  Limit canned vegetables. If you do use them, rinse them well to decrease the sodium.  When eating at a restaurant, ask that your food be prepared with less salt, or no salt if possible. WHAT FOODS CAN I EAT? Seek help from a dietitian for individual calorie needs. Grains Whole grain or whole wheat bread. Brown rice. Whole grain or whole wheat pasta. Quinoa, bulgur, and whole grain cereals. Low-sodium cereals. Corn or whole wheat flour tortillas. Whole grain cornbread. Whole grain crackers. Low-sodium crackers. Vegetables Fresh or frozen vegetables  (raw, steamed, roasted, or grilled). Low-sodium or reduced-sodium tomato and vegetable juices. Low-sodium or reduced-sodium tomato sauce and paste. Low-sodium or reduced-sodium canned vegetables.  Fruits All fresh, canned (in natural juice), or frozen fruits. Meat and Other Protein Products Ground beef (85% or leaner), grass-fed beef, or beef trimmed of fat. Skinless chicken or turkey. Ground chicken or turkey. Pork trimmed of fat. All fish and seafood. Eggs. Dried beans, peas, or lentils. Unsalted nuts and seeds. Unsalted canned beans. Dairy Low-fat dairy products, such as skim or 1% milk, 2% or reduced-fat cheeses, low-fat ricotta or cottage cheese, or plain low-fat yogurt. Low-sodium or reduced-sodium cheeses. Fats and Oils Tub margarines without trans fats. Light or reduced-fat mayonnaise and salad dressings (reduced sodium). Avocado. Safflower, olive, or canola oils. Natural peanut or almond butter. Other Unsalted popcorn and pretzels. The items listed above may not be a complete list of recommended foods or beverages. Contact your dietitian for more options. WHAT FOODS ARE NOT RECOMMENDED? Grains White bread. White pasta. White rice. Refined cornbread. Bagels and croissants. Crackers that contain trans fat. Vegetables Creamed or fried vegetables. Vegetables in a cheese sauce. Regular canned vegetables. Regular canned tomato sauce and paste. Regular tomato and vegetable juices. Fruits Dried fruits. Canned fruit in light or heavy syrup. Fruit juice. Meat and Other Protein Products Fatty cuts of meat. Ribs, chicken wings, bacon, sausage, bologna, salami, chitterlings, fatback, hot dogs, bratwurst, and packaged luncheon meats. Salted nuts and seeds. Canned beans with salt. Dairy Whole or 2% milk, cream, half-and-half, and cream cheese. Whole-fat or sweetened yogurt. Full-fat   cheeses or blue cheese. Nondairy creamers and whipped toppings. Processed cheese, cheese spreads, or cheese  curds. Condiments Onion and garlic salt, seasoned salt, table salt, and sea salt. Canned and packaged gravies. Worcestershire sauce. Tartar sauce. Barbecue sauce. Teriyaki sauce. Soy sauce, including reduced sodium. Steak sauce. Fish sauce. Oyster sauce. Cocktail sauce. Horseradish. Ketchup and mustard. Meat flavorings and tenderizers. Bouillon cubes. Hot sauce. Tabasco sauce. Marinades. Taco seasonings. Relishes. Fats and Oils Butter, stick margarine, lard, shortening, ghee, and bacon fat. Coconut, palm kernel, or palm oils. Regular salad dressings. Other Pickles and olives. Salted popcorn and pretzels. The items listed above may not be a complete list of foods and beverages to avoid. Contact your dietitian for more information. WHERE CAN I FIND MORE INFORMATION? National Heart, Lung, and [employer] Institute: www.nhlbi.nih.gov/health/health-topics/topics/dash/ Document Released: 07/22/2011 Document Revised: 12/17/2013 Document Reviewed: 06/06/2013 ExitCare Patient Information 2015 [employer]. This information is not intended to replace advice given to you by your health care provider. Make sure you discuss any questions you have with your health care provider. Diabetes Mellitus and Food It is important for you to manage your blood sugar (glucose) level. Your blood glucose level can be greatly affected by what you eat. Eating healthier foods in the appropriate amounts throughout the day at about the same time each day will help you control your blood glucose level. It can also help slow or prevent worsening of your diabetes mellitus. Healthy eating may even help you improve the level of your blood pressure and reach or maintain a healthy weight.  HOW CAN FOOD AFFECT ME? Carbohydrates Carbohydrates affect your blood glucose level more than any other type of food. Your dietitian will help you determine how many carbohydrates to eat at each meal and teach you how to count carbohydrates. Counting  carbohydrates is important to keep your blood glucose at a healthy level, especially if you are using insulin or taking certain medicines for diabetes mellitus. Alcohol Alcohol can cause sudden decreases in blood glucose (hypoglycemia), especially if you use insulin or take certain medicines for diabetes mellitus. Hypoglycemia can be a life-threatening condition. Symptoms of hypoglycemia (sleepiness, dizziness, and disorientation) are similar to symptoms of having too much alcohol.  If your health care provider has given you approval to drink alcohol, do so in moderation and use the following guidelines:  Women should not have more than one drink per day, and men should not have more than two drinks per day. One drink is equal to:  12 oz of beer.  5 oz of wine.  1 oz of hard liquor.  Do not drink on an empty stomach.  Keep yourself hydrated. Have water, diet soda, or unsweetened iced tea.  Regular soda, juice, and other mixers might contain a lot of carbohydrates and should be counted. WHAT FOODS ARE NOT RECOMMENDED? As you make food choices, it is important to remember that all foods are not the same. Some foods have fewer nutrients per serving than other foods, even though they might have the same number of calories or carbohydrates. It is difficult to get your body what it needs when you eat foods with fewer nutrients. Examples of foods that you should avoid that are high in calories and carbohydrates but low in nutrients include:  Trans fats (most processed foods list trans fats on the Nutrition Facts label).  Regular soda.  Juice.  Candy.  Sweets, such as cake, pie, doughnuts, and cookies.  Fried foods. WHAT FOODS CAN I EAT? Have nutrient-rich foods,   which will nourish your body and keep you healthy. The food you should eat also will depend on several factors, including:  The calories you need.  The medicines you take.  Your weight.  Your blood glucose level.  Your  blood pressure level.  Your cholesterol level. You also should eat a variety of foods, including:  Protein, such as meat, poultry, fish, tofu, nuts, and seeds (lean animal proteins are best).  Fruits.  Vegetables.  Dairy products, such as milk, cheese, and yogurt (low fat is best).  Breads, grains, pasta, cereal, rice, and beans.  Fats such as olive oil, trans fat-free margarine, canola oil, avocado, and olives. DOES EVERYONE WITH DIABETES MELLITUS HAVE THE SAME MEAL PLAN? Because every person with diabetes mellitus is different, there is not one meal plan that works for everyone. It is very important that you meet with a dietitian who will help you create a meal plan that is just right for you. Document Released: 04/29/2005 Document Revised: 08/07/2013 Document Reviewed: 06/29/2013 ExitCare Patient Information 2015 [employer]. This information is not intended to replace advice given to you by your health care provider. Make sure you discuss any questions you have with your health care provider. Diabetes and Exercise Exercising regularly is important. It is not just about losing weight. It has many health benefits, such as:  Improving your overall fitness, flexibility, and endurance.  Increasing your bone density.  Helping with weight control.  Decreasing your body fat.  Increasing your muscle strength.  Reducing stress and tension.  Improving your overall health. People with diabetes who exercise gain additional benefits because exercise:  Reduces appetite.  Improves the body's use of blood sugar (glucose).  Helps lower or control blood glucose.  Decreases blood pressure.  Helps control blood lipids (such as cholesterol and triglycerides).  Improves the body's use of the hormone insulin by:  Increasing the body's insulin sensitivity.  Reducing the body's insulin needs.  Decreases the risk for heart disease because exercising:  Lowers cholesterol and  triglycerides levels.  Increases the levels of good cholesterol (such as high-density lipoproteins [HDL]) in the body.  Lowers blood glucose levels. YOUR ACTIVITY PLAN  Choose an activity that you enjoy and set realistic goals. Your health care provider or diabetes educator can help you make an activity plan that works for you. Exercise regularly as directed by your health care provider. This includes:  Performing resistance training twice a week such as push-ups, sit-ups, lifting weights, or using resistance bands.  Performing 150 minutes of cardio exercises each week such as walking, running, or playing sports.  Staying active and spending no more than 90 minutes at one time being inactive. Even short bursts of exercise are good for you. Three 10-minute sessions spread throughout the day are just as beneficial as a single 30-minute session. Some exercise ideas include:  Taking the dog for a walk.  Taking the stairs instead of the elevator.  Dancing to your favorite song.  Doing an exercise video.  Doing your favorite exercise with a friend. RECOMMENDATIONS FOR EXERCISING WITH TYPE 1 OR TYPE 2 DIABETES   Check your blood glucose before exercising. If blood glucose levels are greater than 240 mg/dL, check for urine ketones. Do not exercise if ketones are present.  Avoid injecting insulin into areas of the body that are going to be exercised. For example, avoid injecting insulin into:  The arms when playing tennis.  The legs when jogging.  Keep a record of:  Food   intake before and after you exercise.  Expected peak times of insulin action.  Blood glucose levels before and after you exercise.  The type and amount of exercise you have done.  Review your records with your health care provider. Your health care provider will help you to develop guidelines for adjusting food intake and insulin amounts before and after exercising.  If you take insulin or oral hypoglycemic  agents, watch for signs and symptoms of hypoglycemia. They include:  Dizziness.  Shaking.  Sweating.  Chills.  Confusion.  Drink plenty of water while you exercise to prevent dehydration or heat stroke. Body water is lost during exercise and must be replaced.  Talk to your health care provider before starting an exercise program to make sure it is safe for you. Remember, almost any type of activity is better than none. Document Released: 10/23/2003 Document Revised: 12/17/2013 Document Reviewed: 01/09/2013 ExitCare Patient Information 2015 [employer]. This information is not intended to replace advice given to you by your health care provider. Make sure you discuss any questions you have with your health care provider.  

## 2014-10-18 NOTE — Progress Notes (Signed)
Patient presents for CBG and record review Med list reviewed; patient reports taking all meds as directed Patient's AM fasting blood sugars ranging 83-239 Patient's before lunch blood sugars ranging 115-256 Patient's before dinner blood sugars ranging 115-256 as well Fasting lab results reviewed with pt. Patient aware of kidney status and to avoid all NSAIDS as listed in PCP note C/o 5-6 headaches/week; rates 8/10 at present. Used to take half tab hydrocodone for relief but ran out "a while ago."  Now taking 1000 mg acetaminophen with relief 1 hour after taking Also c/o chronic right hand pain following CVA; rates 8/10 at present Discussed need for low sodium diet and using Mrs. Dash as alternative to salt Encouraged to choose foods with 5% or less of daily value for sodium. [facility]  CBG 256 2 hours after meal of sloppy joe, 2 slices Hawaiian bread, pack of sweet and salty trail mix and water. Patient refusing 10 units novolog insulin. States it will drop her BS too low when she takes this evening's dose of lantus; PCP made aware  BP 118/69 P 89 R 18 T 98.3 oral SpO2 100%  Per PCP: Increase lantus to 28 units q HS Tramadol 50 mg bid prn #60 NR  Return in 1 month for nurse visit for BP check and CBG and record review   Patient advised to call for med refills at least 7 days before running out so as not to go without.  Patient given literature on DASH Eating Plan, Diabetes and Food, Diabetes and Exercise

## 2014-10-28 ENCOUNTER — Other Ambulatory Visit: Payer: Self-pay | Admitting: Internal Medicine

## 2014-11-01 ENCOUNTER — Other Ambulatory Visit (HOSPITAL_COMMUNITY): Payer: Medicare Other

## 2014-11-11 ENCOUNTER — Ambulatory Visit (HOSPITAL_COMMUNITY)
Admission: RE | Admit: 2014-11-11 | Discharge: 2014-11-11 | Disposition: A | Discharge status: Home / Self Care | Payer: Medicare Other | Source: Ambulatory Visit | Attending: Surgery | Admitting: Surgery

## 2014-11-11 DIAGNOSIS — I6523 Occlusion and stenosis of bilateral carotid arteries: Secondary | ICD-10-CM | POA: Diagnosis not present

## 2014-11-11 DIAGNOSIS — Z48812 Encounter for surgical aftercare following surgery on the circulatory system: Secondary | ICD-10-CM | POA: Insufficient documentation

## 2014-11-12 ENCOUNTER — Encounter: Payer: Self-pay | Admitting: Vascular Surgery

## 2014-11-13 ENCOUNTER — Ambulatory Visit (INDEPENDENT_AMBULATORY_CARE_PROVIDER_SITE_OTHER): Payer: Medicare Other | Admitting: Vascular Surgery

## 2014-11-13 ENCOUNTER — Encounter: Payer: Self-pay | Admitting: Vascular Surgery

## 2014-11-13 ENCOUNTER — Other Ambulatory Visit: Payer: Self-pay | Admitting: *Deleted

## 2014-11-13 VITALS — BP 148/42 | HR 96 | Temp 98.4°F | Resp 16 | Ht 62.0 in | Wt 138.0 lb

## 2014-11-13 DIAGNOSIS — I6523 Occlusion and stenosis of bilateral carotid arteries: Secondary | ICD-10-CM

## 2014-11-13 DIAGNOSIS — I6529 Occlusion and stenosis of unspecified carotid artery: Secondary | ICD-10-CM

## 2014-11-13 NOTE — Progress Notes (Signed)
[facility]  Patient name: Sonia Skinner MRN: [id number] DOB: [birth year] Sex: female  REASON FOR VISIT: Follow up of carotid disease  V[facility] PATIENT  HPI: Sonia Skinner is a 64 y.o. female who underwent a left carotid endarterectomy with bovine pericardial patch angioplasty on 11/04/2013. She had presented with a left brain stroke and was found to have a tight left carotid stenosis. She also had carotid siphon disease bilaterally. Of note the CT angiogram suggested that she had some clot at the carotid bulb and there was a small amount of fresh clot present at the carotid bifurcation at the time of her carotid endarterectomy.   She was last seen in our office on 12/19/2013 by Vinnie Level nickel. At that time there was less than 40% right internal carotid artery stenosis and the left carotid endarterectomy site was widely patent. Since she was seen last she denies any history of stroke, TIAs, expressive or receptive aphasia, or amaurosis fugax. She does note some issues with shaking in her left hand which she has had and has not changed significantly.  She is on aspirin and is on a statin.  Past Medical History  Diagnosis Date  . Hypertension   . Thyroid disease   . Complication of anesthesia     ALLERY TO ESTER BASE  . Diabetes mellitus     INSULIN DEPENDENT  . GERD (gastroesophageal reflux disease)   . Carotid artery occlusion   . Stroke March 2015     left MCA infarct   Family History  Problem Relation Age of Onset  . Diabetes Mother   . Heart disease Mother     Before age 18  . Cancer Father     Lung  . Hypertension Sister   . Diabetes Sister   . Diabetes Sister   . Diabetes Sister    SOCIAL HISTORY: History  Substance Use Topics  . Smoking status: Former Smoker -- 0.20 packs/day for 20 years    Types: Cigarettes    Quit date: 11/01/2013  . Smokeless tobacco: Never Used  . Alcohol Use: No   Allergies  Allergen Reactions  . Anesthetics, Ester  Anaphylaxis   Current Outpatient Prescriptions  Medication Sig Dispense Refill  . acetaminophen (TYLENOL) 325 MG tablet Take 650 mg by mouth every 6 (six) hours as needed for headache.    Marland Kitchen amLODipine (NORVASC) 5 MG tablet TAKE 1 TABLET BY MOUTH DAILY. 30 tablet 3  . aspirin 325 MG tablet Take 1 tablet (325 mg total) by mouth daily.    Marland Kitchen atorvastatin (LIPITOR) 40 MG tablet TAKE 1 TABLET BY MOUTH DAILY AT 6 PM. 30 tablet 2  . buPROPion (WELLBUTRIN SR) 150 MG 12 hr tablet Take 1 tablet (150 mg total) by mouth 2 (two) times daily. Take 2 tables in the morning and one tablet at night 90 tablet 3  . docusate sodium 100 MG CAPS Take 100 mg by mouth daily. 10 capsule 0  . exenatide (BYETTA) 10 MCG/0.04ML SOPN injection Inject 0.02 mLs (5 mcg total) into the skin 2 (two) times daily with a meal. 2.4 mL 3  . insulin glargine (LANTUS) 100 UNIT/ML injection Inject 0.28 mLs (28 Units total) into the skin at bedtime. 10 mL 5  . levothyroxine (SYNTHROID, LEVOTHROID) 125 MCG tablet Take 1 tablet (125 mcg total) by mouth daily. 90 tablet 3  . lisinopril-hydrochlorothiazide (PRINZIDE,ZESTORETIC) 20-25 MG per tablet Take 1 tablet by mouth daily. 30 tablet 0  . loratadine (  CLARITIN) 10 MG tablet Take 1 tablet (10 mg total) by mouth daily. 30 tablet 0  . omeprazole (PRILOSEC) 40 MG capsule TAKE 1 CAPSULE BY MOUTH ONCE DAILY. 30 capsule 3  . terbinafine (LAMISIL) 250 MG tablet Take 1 tablet (250 mg total) by mouth daily. 90 tablet 0  . traMADol (ULTRAM) 50 MG tablet Take 1 tablet (50 mg total) by mouth 2 (two) times daily. 60 tablet 0  . nicotine (NICODERM CQ - DOSED IN MG/24 HOURS) 14 mg/24hr patch Place 1 patch (14 mg total) onto the skin daily. (Patient not taking: Reported on 11/13/2014) 28 patch 0   No current facility-administered medications for this visit.   REVIEW OF SYSTEMS: Valu.Nieves ] denotes positive finding; [  ] denotes negative finding  CARDIOVASCULAR:  [ ]  chest pain   [ ]  chest pressure   [ ]   palpitations   [ ]  orthopnea   [ ]  dyspnea on exertion   [ ]  claudication   [ ]  rest pain   [ ]  DVT   [ ]  phlebitis PULMONARY:   [ ]  productive cough   [ ]  asthma   [ ]  wheezing NEUROLOGIC:   Valu.Nieves ] weakness  [ X] paresthesias  [ ]  aphasia  [ ]  amaurosis  [ ]  dizziness HEMATOLOGIC:   [ ]  bleeding problems   [ ]  clotting disorders MUSCULOSKELETAL:  [ ]  joint pain   [ ]  joint swelling [ ]  leg swelling GASTROINTESTINAL: [ ]   blood in stool  [ ]   hematemesis GENITOURINARY:  [ ]   dysuria  [ ]   hematuria PSYCHIATRIC:  [ ]  history of major depression INTEGUMENTARY:  [ ]  rashes  [ ]  ulcers CONSTITUTIONAL:  [ ]  fever   [ ]  chills  PHYSICAL EXAM: Filed Vitals:   11/13/14 1433 11/13/14 1436  BP: 140/43 148/42  Pulse: 98 96  Temp: 98.4 F (36.9 C)   TempSrc: Oral   Resp: 16   Height: 5\' 2"  (1.575 m)   Weight: 138 lb (62.596 kg)   SpO2: 100%    GENERAL: The patient is a well-nourished female, in no acute distress. The vital signs are documented above. CARDIOVASCULAR: There is a regular rate and rhythm. She has bilateral carotid bruits. PULMONARY: There is good air exchange bilaterally without wheezing or rales. ABDOMEN: Soft and non-tender with normal pitched bowel sounds.  MUSCULOSKELETAL: There are no major deformities or cyanosis. NEUROLOGIC: No focal weakness or paresthesias are detected. SKIN: There are no ulcers or rashes noted. PSYCHIATRIC: The patient has a normal affect.  DATA:  I reviewed the carotid duplex scan that was performed on 11/11/2014. This showed a less than 40% right carotid stenosis with no evidence of recurrent stenosis on the left.  MEDICAL ISSUES: CAROTID STENOSIS: the patient is doing well status post left carotid endarterectomy. There is no evidence of recurrent carotid stenosis on the left and she has minimal stenosis on the right. I think we can stretch her follow up out to 1 year and I'll see her back at that time. I have ordered a follow up duplex scan at that  time. She knows to stay on her aspirin and statin. She has requested that she be seen by me in 1 year.  Return in about 1 year (around 11/13/2015).  [facility] [place] [facility]: [phone]

## 2014-11-18 ENCOUNTER — Ambulatory Visit: Payer: Medicare Other | Attending: Internal Medicine | Admitting: *Deleted

## 2014-11-18 VITALS — BP 113/62 | HR 94 | Temp 98.2°F | Resp 20

## 2014-11-18 DIAGNOSIS — Z794 Long term (current) use of insulin: Secondary | ICD-10-CM | POA: Insufficient documentation

## 2014-11-18 DIAGNOSIS — Z87891 Personal history of nicotine dependence: Secondary | ICD-10-CM | POA: Diagnosis not present

## 2014-11-18 DIAGNOSIS — E1165 Type 2 diabetes mellitus with hyperglycemia: Secondary | ICD-10-CM

## 2014-11-18 DIAGNOSIS — E119 Type 2 diabetes mellitus without complications: Secondary | ICD-10-CM | POA: Insufficient documentation

## 2014-11-18 LAB — GLUCOSE, POCT (MANUAL RESULT ENTRY): POC Glucose: 209 mg/dl — AB (ref 70–99)

## 2014-11-18 NOTE — Progress Notes (Signed)
Patient presents for CBG and record review after increasing lantus to 28 units q hs Med list reviewed; patient reports taking all meds as directed Patient's AM fasting blood sugars ranging 62-240 States felt weak when BS 62, ate something right away and felt better Discussed s/sx of hypoglycemia and immediate actions to take in detail Patient's PM blood sugars were done less than 2 hours after evening meal Instructed to wait at least 2 hours after meal to recheck blood sugar States walking sporadically. Encouraged to walk 30 minutes daily for exercise  CBG 209 3.5 hours after meal  BP 113/62 P 94 R 20 T  98.2 oral SpO2 100%  Per PCP: No changes to meds at this time Bedtime snack to prevent low AM fasting blood sugars  Patient aware that she is to f/u with PCP 3 months from last visit. Due 12/16/2014  Patient given literature on hypoglycemia

## 2014-11-18 NOTE — Patient Instructions (Signed)
Hypoglycemia °Hypoglycemia occurs when the glucose in your blood is too low. Glucose is a type of sugar that is your body's main energy source. Hormones, such as insulin and glucagon, control the level of glucose in the blood. Insulin lowers blood glucose and glucagon increases blood glucose. Having too much insulin in your blood stream, or not eating enough food containing sugar, can result in hypoglycemia. Hypoglycemia can happen to people with or without diabetes. It can develop quickly and can be a medical emergency.  °CAUSES  °· Missing or delaying meals. °· Not eating enough carbohydrates at meals. °· Taking too much diabetes medicine. °· Not timing your oral diabetes medicine or insulin doses with meals, snacks, and exercise. °· Nausea and vomiting. °· Certain medicines. °· Severe illnesses, such as hepatitis, kidney disorders, and certain eating disorders. °· Increased activity or exercise without eating something extra or adjusting medicines. °· Drinking too much alcohol. °· A nerve disorder that affects body functions like your heart rate, blood pressure, and digestion (autonomic neuropathy). °· A condition where the stomach muscles do not function properly (gastroparesis). Therefore, medicines and food may not absorb properly. °· Rarely, a tumor of the pancreas can produce too much insulin. °SYMPTOMS  °· Hunger. °· Sweating (diaphoresis). °· Change in body temperature. °· Shakiness. °· Headache. °· Anxiety. °· Lightheadedness. °· Irritability. °· Difficulty concentrating. °· Dry mouth. °· Tingling or numbness in the hands or feet. °· Restless sleep or sleep disturbances. °· Altered speech and coordination. °· Change in mental status. °· Seizures or prolonged convulsions. °· Combativeness. °· Drowsiness (lethargic). °· Weakness. °· Increased heart rate or palpitations. °· Confusion. °· Pale, gray skin color. °· Blurred or double vision. °· Fainting. °DIAGNOSIS  °A physical exam and medical history will be  performed. Your caregiver may make a diagnosis based on your symptoms. Blood tests and other lab tests may be performed to confirm a diagnosis. Once the diagnosis is made, your caregiver will see if your signs and symptoms go away once your blood glucose is raised.  °TREATMENT  °Usually, you can easily treat your hypoglycemia when you notice symptoms. °· Check your blood glucose. If it is less than 70 mg/dl, take one of the following:   °¨ 3-4 glucose tablets.   °¨ ½ cup juice.   °¨ ½ cup regular soda.   °¨ 1 cup skim milk.   °¨ ½-1 tube of glucose gel.   °¨ 5-6 hard candies.   °· Avoid high-fat drinks or food that may delay a rise in blood glucose levels. °· Do not take more than the recommended amount of sugary foods, drinks, gel, or tablets. Doing so will cause your blood glucose to go too high.   °· Wait 10-15 minutes and recheck your blood glucose. If it is still less than 70 mg/dl or below your target range, repeat treatment.   °· Eat a snack if it is more than 1 hour until your next meal.   °There may be a time when your blood glucose may go so low that you are unable to treat yourself at home when you start to notice symptoms. You may need someone to help you. You may even faint or be unable to swallow. If you cannot treat yourself, someone will need to bring you to the hospital.  °HOME CARE INSTRUCTIONS °· If you have diabetes, follow your diabetes management plan by: °¨ Taking your medicines as directed. °¨ Following your exercise plan. °¨ Following your meal plan. Do not skip meals. Eat on time. °¨ Testing your blood   glucose regularly. Check your blood glucose before and after exercise. If you exercise longer or different than usual, be sure to check blood glucose more frequently. °¨ Wearing your medical alert jewelry that says you have diabetes. °· Identify the cause of your hypoglycemia. Then, develop ways to prevent the recurrence of hypoglycemia. °· Do not take a hot bath or shower right after an  insulin shot. °· Always carry treatment with you. Glucose tablets are the easiest to carry. °· If you are going to drink alcohol, drink it only with meals. °· Tell friends or family members ways to keep you safe during a seizure. This may include removing hard or sharp objects from the area or turning you on your side. °· Maintain a healthy weight. °SEEK MEDICAL CARE IF:  °· You are having problems keeping your blood glucose in your target range. °· You are having frequent episodes of hypoglycemia. °· You feel you might be having side effects from your medicines. °· You are not sure why your blood glucose is dropping so low. °· You notice a change in vision or a new problem with your vision. °SEEK IMMEDIATE MEDICAL CARE IF:  °· Confusion develops. °· A change in mental status occurs. °· The inability to swallow develops. °· Fainting occurs. °Document Released: 08/02/2005 Document Revised: 08/07/2013 Document Reviewed: 11/29/2011 °[employer]® Patient Information ©2015 [employer]. This information is not intended to replace advice given to you by your health care provider. Make sure you discuss any questions you have with your health care provider. ° °

## 2014-11-19 ENCOUNTER — Ambulatory Visit: Payer: Medicare Other | Attending: Nurse Practitioner | Admitting: Psychology

## 2014-11-19 DIAGNOSIS — F0632 Mood disorder due to known physiological condition with major depressive-like episode: Secondary | ICD-10-CM | POA: Diagnosis not present

## 2014-11-19 DIAGNOSIS — Z5189 Encounter for other specified aftercare: Secondary | ICD-10-CM | POA: Insufficient documentation

## 2014-11-19 DIAGNOSIS — R4189 Other symptoms and signs involving cognitive functions and awareness: Secondary | ICD-10-CM | POA: Diagnosis not present

## 2014-11-19 DIAGNOSIS — I6529 Occlusion and stenosis of unspecified carotid artery: Secondary | ICD-10-CM | POA: Insufficient documentation

## 2014-11-19 DIAGNOSIS — M6281 Muscle weakness (generalized): Secondary | ICD-10-CM | POA: Insufficient documentation

## 2014-11-19 NOTE — Progress Notes (Signed)
[facility]  [address]   Telephone [phone] [address] Fax [phone] [place], [place] [address]   Initial Contact Note  Name:  [name] Date of Birth; Oct 23, 1950 MRN:  [id number] Date:  11/19/2014  Sonia Skinner is an 64 y.o. female who was referred for neuropsychological evaluation by C. Floyde Parkins, MD of [facility] due to her complaints of cognitive decline subsequent to a stroke in March 2015.   A total of 5 hours was spent today reviewing medical records, interviewing (CPT [phone]) [name] and administering and scoring neurocognitive tests (CPT [phone] & [phone]).  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   A full report will follow once the planned testing has been completed. Her next appointment is scheduled for 11/26/14.   Jamey Ripa, Ph.D Licensed Psychologist 11/19/2014

## 2014-11-20 ENCOUNTER — Encounter: Payer: Self-pay | Admitting: Podiatrist

## 2014-11-20 ENCOUNTER — Ambulatory Visit: Payer: No Typology Code available for payment source | Admitting: Podiatrist

## 2014-11-20 DIAGNOSIS — Z79899 Other long term (current) drug therapy: Secondary | ICD-10-CM

## 2014-11-20 DIAGNOSIS — B351 Tinea unguium: Secondary | ICD-10-CM

## 2014-11-20 MED ORDER — TERBINAFINE HCL 250 MG PO TABS: 250.0000 mg | ORAL_TABLET | Freq: Every day | ORAL | Status: DC

## 2014-11-20 NOTE — Patient Instructions (Signed)

## 2014-11-26 ENCOUNTER — Ambulatory Visit (INDEPENDENT_AMBULATORY_CARE_PROVIDER_SITE_OTHER): Payer: Medicare Other | Admitting: Psychology

## 2014-11-26 ENCOUNTER — Encounter: Payer: Self-pay | Admitting: Psychology

## 2014-11-26 DIAGNOSIS — I69319 Unspecified symptoms and signs involving cognitive functions following cerebral infarction: Secondary | ICD-10-CM

## 2014-11-26 DIAGNOSIS — F0632 Mood disorder due to known physiological condition with major depressive-like episode: Secondary | ICD-10-CM | POA: Diagnosis not present

## 2014-11-26 DIAGNOSIS — I6931 Cognitive deficits following cerebral infarction: Secondary | ICD-10-CM | POA: Diagnosis not present

## 2014-11-26 NOTE — Progress Notes (Addendum)
[place] ___________________________________________________________________________ [address]                                                                           Telephone [phone] [address]                                                                                                 Fax [phone] [place], [place] [address]  [place]* This report should not be released without the consent of the client  Name:   Sonia Skinner   Date of Birth:  August 23, 2050 [facility] MR#:  [id number] Dates of Evaluation: 11/19/14 & 11/26/14   Reason for Referral Charmine Kimberlin is a 64 year-old left-handed woman who was referred for neuropsychological evaluation by Floyde Parkins, MD of [facility] for an assessment of her cognitive functioning. Ms. 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 Her initial complaints were intermittent blurring of vision involving her left eye and weakness involving her right upper extremity. Brain MRI and MRA scans on 11/02/13 revealed scattered acute left middle cerebral artery infarcts and significant stenosis of the left internal carotid artery, respectively. She was readmitted to the hospital on 11/25/13 with new onset left sided numbness. An evaluation from a Copywriter, advertising on 11/26/13 indicated that her speech, language and cognitive abilities were within normal limits. She received outpatient Occupational Therapy services focused on her left hand functioning. Her OT discharge summary on 08/01/14 listed residual problems as intermittent left hand pain and fatigue with overuse.  Sources of Information Electronic medical records from the [facility] were reviewed. Sonia Skinner was interviewed.    Chief Complaints Sonia Skinner reported a gradual and progressive decline in her abilities to concentrate, remember, multi-task and organize since her stroke of  2015. She gave recent examples of adding ingredients out of sequence while cooking, driving to the wrong address, forgetting to perform a task as planned and being unable keep her personal possessions organized in her home.   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 She noted that she has been less able to generate instructional ideas and has been "less patient" with her students. She reported that she has not received any negative feedback on her job performance.    In addition to cognitive problems, she reported daytime fatigue, problems with sleep maintenance, reduced left hand coordination and loss of balance when she moves quickly.  When asked about her mood, she replied that for many years she has experienced occasional several day long periods of depressed mood without obvious precipitant, more frequently since her stroke. She also reported that she cries more easily since her stroke but only to sad or upsetting thoughts and experiences. She denied experiencing persisting  sadness, apathy, mood swings, racing thoughts, undue anxiety, problems with impulse control, suicidal or homicidal ideation, hallucinations, or delusional ideas. She did not cite any recent or new life stressors.   Background Sonia Skinner has been living with her sister since she separated from her husband in 2000. She has no children.   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   She reported that she completed two years of college but dropped out due to problems with concentration. She did not report any history of attentional or learning difficulties prior to attending college. She reported that she was evaluated at her request for possible ADHD at [facility] in 2011 but does not know the findings from that evaluation.   Her past  medical history was notable for diabetes Type II, gastroesophageal reflux disease hypertension and thyroid disease. She reported no history of head injury, seizures, neurological infections or exposure to neurotoxic chemicals. She reported no history of alcohol abuse or illicit drug use. She quit smoking immediately after her stroke in 2015.   Her current medications include amlodipine, aspirin, atorvastatin, bupropion SR, exenatide, insulin, levothyroxine, lisinopril-hydrochlorothiazide, omeprazole, and tramadol.   With regards to her mental health history, she reported that she was hospitalized in her thirties for depression and subsequently saw a psychiatrist on an outpatient basis for two years. She could not remember what, if anything precipitated her depression. She reported that she took Valium in her thirties for anxiety and was prescribed antidepressant medication at the age of 64, which she discontinued after two years. She reported that she has been taking bupropion SR for the past five years for purposes of smoking cessation.   Observations She appeared as an appropriately dressed and groomed woman in no apparent distress. She did not display any unusual motor mannerisms or motor activity. She appeared fatigued. Her affect appeared flat. She did not display overt signs of emotional distress. She interacted in a cooperative and appropriate manner. She spoke in a normal tone of voice, maintained good eye contact and responded to all questions. She was slow to answer many questions about past life events. No problems were evident for speech articulation, prosody, word finding, word selection, message coherence or language comprehension. Her thought processes were coherent and organized without signs of loose associations or verbal perseverations. Her thought content was devoid of unusual or bizarre ideas.  Evaluation Procedures In addition to a review of medical records and clinical interviews,  the following tests and questionnaires were administered: Animal Naming Test [facility] Naming Test Controlled Oral Word Association Test  Finger Tapping Geriatric Anxiety Scale Geriatric Depression Scale Grooved Pegboard Test Hand Dynamometer Hooper Test of Visual Organization Rey Complex Figure- copy Rey 15-Item memory Test Stroop Color Word Test Trail Making A & B  Wechsler Adult Intelligence Scale- IV: Music therapist, Coding, Digit Span, Matrix Reasoning & Similarities Wechsler Memory Scale-IV Older Adult Battery    Wide Range Achievement Test- 4  Test Results Validity & Interpretative Considerations She was cooperative though displayed many signs of emotional distress or fatigue, including a mostly sullen expression, flat affect, holding her head in her hand,  frequent sighs and exclaiming "Oh no" when a test was introduced. She did not report or display problems with vision (she wore her eyeglasses) or hearing. She displayed mild left hand tremulousness. She was able to comprehend task instructions. There were  no signs of careless, impulsive or perseverative responding. On a test of effort (Rey 15-Item Memory Test) that required her to draw fifteen symbols that can be relatively easily memorized due to redundancy from immediate memory, she was able to draw all fifteen symbols. In sum, test results were considered to represent a valid measure of her current cognitive functioning.  Her pre-morbid level of general cognitive functioning was estimated to fall within the Average range based on her educational/vocational background coupled with a measure of word reading (Wide Range Achievement Test-4: Word Reading) that is considered to be highly resistant to the effects of neurological or psychiatric disorder.   Her test scores were corrected to reflect norms for her age and, whenever possible, her gender, race (i.e., African-American) and educational level (i.e., 14 years). Please see a listing  of test scores at the end of this report.  Speed of Processing, Attention & Executive Function  Her performances on tests that required focused attention and speed of processing were mixed. Her speed to draw lines to connect numbers randomly arrayed on a page in sequence (Trails A) was severely slowed. In contrast, she was able to transcribe an average number symbols to match digits using a key on a clerical-like task (Wechsler Adult Intelligence Scale- IV (WAIS-IV) Coding).    Her working memory span was mostly normal though perhaps lower than expected. Her abilities to mentally re-arrange digit sequences in ascending order (WAIS-IV Digit Span Sequencing) or immediately recognize symbols in left to right order (Wechsler Memory Scale-IV WMS-IV Symbol Span) fell at the lower boundary of the Average range. She did less well on a test that required her to mentally re-arrange digit sequences in reverse order (WAIS-IV Digit Span backward) as her score fell at the lower boundary of the Low Average range.  Her speed to read words, name color hues or name print color while simultaneously ignoring the conflicting word (Stroop Color Word Test) were all within the impaired range.  Her speed on a task that required mental tracking and set shifting to maintain an alternating and ascending sequence of letters and numbers (Trails B) was within the Borderline range. She deviated from the correct sequence four times.  Verbal fluency was normal based on her average abilities to spontaneously generate words to designated letters under time pressure (Controlled Oral Word Association Test) or name animals Geologist, engineering Test).  She scored within the Average range on abstract reasoning tests that required her to verbally classify ostensibly different objects or ideas to a shared category (WAIS-IV Similarities) or apply logical reasoning to match designs or symbols in an abstract manner (WAIS-IV Matrix Reasoning).  She  performed poorly on a novel problem solving task ([employer]) that required identifying and systematically applying conceptual rules to sort geometric designs. She was able to identify only one matching principle after a very high number of trials. She twice deviated from a correct strategy (i.e., loss set). Observations suggested that her apparent high frustration level during this test was a factor in her poor performance.   Learning & Memory Her performance on the memory battery (WMS-IV) was within normal limits. Her ability to learn and retain orally-presented information (WMS-IV Auditory Memory Index: 50th percentile) was significantly better than her ability to do so with visual material (Visual Memory Index: 19th percentile). She demonstrated normal persistence of both auditory and visual information in memory after delay.   Language No qualitative signs of problems with expressive or receptive language functions were apparent.  Her ability to name to confrontation [facility] [employer]) was well within normal limits. As noted above, her phonemic fluency (Controlled Oral Word Association Test) and ability to read words (Wide Range Achievement Test-4: Word Reading) fell within the Average range.   Visual Perceptual & Visuospatial Construction There were no signs of spatial inattention. She was able to accurately perceive colors, words and objects. Visual scanning speed was slowed (Trails A). Visuospatial organization was normal based on her abilities to identify objects from fragmented drawings Chartered loss adjuster) and match designs in an abstract manner (WAIS-IV Matrix Reasoning). In contrast, she demonstrated defective visual-spatial constructional skills as evident on visual-motor tasks that required assembly of block designs from models Product manager) or copy of a spatially-complex geometric design (Rey Complex Figure).   Fine Motor Skills She reported and  displayed consistent left hand preference. She displayed signs of mild left hand tremulousness. Her fine motor speed (Finger Tapping) was within the Superior range for her left hand and the Very Superior range for her right hand. Her eye-hand speed of dexterity, as assessed by her speed to put grooved pegs into a formboard (Grooved Pegboard Test), was within the High Average range for her left hand and the Average range for her right hand. On a measure of handgrip strength (Hand Dynamometer) she showed mild weakening of her left hand at the lower end of the Low Average range. Her right hand grip strength was within the Average range.  Emotional Status On the Geriatric Depression scale, her score of 29/30 was suggestive of a severe degree of depression. The only symptom she did not endorse was feeling hopeless. Her score of 32 on the Geriatric Anxiety Scale fell within the severe range. She endorsed somatic, affective and cognitive symptoms of depression.     Summary & Conclusions Sonia Skinner is a 64 year-old left-handed woman who reported progressively worsening cognitive difficulties since she had a left middle cerebral artery stroke on 11/01/13. She also cited persisting daytime fatigue, problems with sleep maintenance, reduced left hand coordination, loss of balance when she moves quickly and depressed mood. Of note, she reported having experienced problems with attention and concentration as well as recurrent depressive episodes since she was a young adult.   Neuropsychological testing indicated deficits specific to nonverbal functioning involving visual attention and processing speed, visual sequencing, visual-spatial construction and nonverbal executive functions. In addition, measures of visual memory and left hand grip strength fell within the Low Average range, which likely represented a mild reduction from her pre-injury level. Her verbal memory, language and verbal conceptual skills were within  normal expectations.   With regards to her psychological functioning, she endorsed a severe degree of anxiety and depression on standardized symptom questionnaires. Observations were notable for flat affect, tired appearance, psychomotor slowing and low frustration tolerance to cognitive challenge.  In conclusion, her cognitive difficulties are likely due to a combination of psychological and neurological factors. It is probable that depression, fatigue, anxiety and frustration, which to some extent pre-dated her stroke, are significantly disrupting her everyday cognitive functioning. It appears reasonable to primarily attribute her deficits or weaknesses for left hand grip strength, visual scanning speed, visual memory and visual-spatial constructional skills to her stroke. While her neuropsychological deficit profile was indicative of a right brain stroke, neuroradiological studies showed left hemisphere infarcts. This discrepancy cannot be explained. The intactness of her language functioning might be explained by assuming that this left-handed woman is right brain dominant for language. Finally,  her diabetes, especially if poorly controlled, could also be contributing to her cognitive difficulties.  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 Treatment options for depression should be considered. She reported that she has been taking bupropion SR for the past five years for purposes of smoking cessation. Other pharmacologic options for depression (and perhaps also to address chronic fatigue) should be entertained. Given her history of recurrent depressive episodes that pre-dated her stroke, referral to a psychiatrist should be considered. She was not interested in pursuing psychological counselling at this time.  2. If her thyroid panel has not been recently  checked, this would be advisable to rule-out hypothyroidism as a contributor to her report of fatigue and mood disturbance.  3. She was encouraged to comply with her diabetes treatment as she was described as not fully compliant with her diabetes diet and medications in a recent note by her primary care provider. She disputed this. She was informed that poorly controlled diabetes can have negative effects on health and cognitive functioning.  The conclusions and recommendations from this evaluation were discussed with her on 11/26/14.    I have appreciated the opportunity to evaluate Sonia Skinner. Please feel free to contact me with any comments or questions.    ___________________ Jamey Ripa, Ph.D Licensed Psychologist     ADDENDUM-NEUROPSYCHOLOGICAL TEST RESULTS  Name:   Sonia Skinner   Date of Birth:  04/07/2051 [facility] MR#:  [id number] Dates of Evaluation: 11/19/14 & 11/26/14    Animal Naming Test Score= 19 38th (adjusted for age, gender, race and educational level)    [place] Score=  78 /60 >99th  (adjusted for age, gender, race and educational level)    Controlled Oral Word Association Test Score=  31 words/2 repetitions 42nd (adjusted for age, gender, race and educational level)    Finger Tapping R: 51.3 97th (adjusted for age, gender, race and educational level)  L: 13.3 93rd (adjusted for age, gender, race and educational level)    Grooved Pegboard R: 94s 34th (adjusted for age, gender, race and educational level)  L: 73s 86th (adjusted for age, gender, race and educational level)    Hand Dynamometer R: 26 kg. 58th (adjusted for age, gender, race and educational level)  L: 22 kg. 10th (adjusted for age, gender, race and educational level)    Land Adjusted score= 23.5  Low probability of impairment    Rey Complex Figure: copy       Score= 13.5/36  Impaired    Rey 15-Item Memory Test Score=  15/15 Normal     Stroop Test  Score residual % (adjusted for age and educational level)  Word 86 -48 <1st   Color 51 -23  3rd   Color-Word 16 -20  3rd      Trails A Score= 100s 2e <1st   (adjusted for age, gender, race and educational level)  Trails B Score= 28s 4e   8th   (adjusted for age, gender, race and educational level)    Wechsler Adult Intelligence Scale-IV (WAIS-IV)  Subtest Scaled Score Percentile  Block Design   5   5th   Similarities 11 63rd    Digit Span  Forward               Backward               Sequencing  7  8  6  8  16th        25th    9th   25th    Matrix Reasoning 10 50th   Coding    8 25th      Wechsler Memory Scale-IV (WMS-IV) Older Adult Battery  Index Index Score Percentile  Immediate Memory   90 25th   Auditory Memory 100 50th   Visual Memory   87 19th    Delayed Memory 100 50th   Symbol Span Scaled score= 8 25th     Wisconsin Card Sorting Test  Total errors= 67   7th  (adjusted for age and education)  Perseverative errors= 56 27th   Categories= 1 11th - 16th    Trials to first category= 61   6th - 10th    Failure to maintain set 2 11th - 16th   Learning to learn= N/A      Wide Range Achievement Test-4 Subtest  Raw score Standard score Percentile  Word Reading 59/71 99 47th

## 2014-12-01 ENCOUNTER — Encounter: Payer: Self-pay | Admitting: Psychology

## 2014-12-02 ENCOUNTER — Other Ambulatory Visit: Payer: Self-pay | Admitting: Internal Medicine

## 2014-12-03 ENCOUNTER — Telehealth: Payer: Self-pay | Admitting: Neurology

## 2014-12-03 NOTE — Telephone Encounter (Signed)
I called the patient. The testing showed some degree of anxiety and depression. If this is not being addressed, the patient is to contact our office.   Neuropsychological testing results:  In conclusion, her cognitive difficulties are likely due to a combination of psychological and neurological factors. It is probable that depression, fatigue, anxiety and frustration, which to some extent pre-dated her stroke, are significantly disrupting her everyday cognitive functioning. It appears reasonable to primarily attribute her deficits or weaknesses for left hand grip strength, visual scanning speed, visual memory and visual-spatial constructional skills to her stroke. While her neuropsychological deficit profile was indicative of a right brain stroke, neuroradiological studies showed left hemisphere infarcts. This discrepancy cannot be explained. The intactness of her language functioning might be explained by assuming that this left-handed woman is right brain dominant for language. Finally, her diabetes, especially if poorly controlled, could also be contributing to her cognitive difficulties.

## 2014-12-07 ENCOUNTER — Other Ambulatory Visit: Payer: Self-pay | Admitting: Internal Medicine

## 2014-12-09 MED ORDER — LORATADINE 10 MG PO TABS: 10.0000 mg | ORAL_TABLET | Freq: Every day | ORAL | Status: DC

## 2014-12-09 NOTE — Addendum Note (Signed)
Addended by: Dorothe Pea on: 12/09/2014 12:21 PM   Modules accepted: Orders

## 2014-12-10 ENCOUNTER — Other Ambulatory Visit: Payer: Self-pay | Admitting: Internal Medicine

## 2014-12-10 NOTE — Telephone Encounter (Signed)
Patient walked in to clinic asking for refill of Wellbutrin.  I gave her a one month supply patient has appointment with PCP on 12/24/14

## 2014-12-13 NOTE — Progress Notes (Signed)
Chief Complaint  Patient presents with  . Nail Problem    rechecknails, finished lamisil x 3 months    Subjective: Patient presents today for follow-up of fungal nails. She finished her Lamisil in today for 3 months.  Objective: Neurovascular status intact and unchanged. The toenail still appear to be fungal.  Assessment: Fungal nails plan: Recommended another month of antifungal medication and this was called in for her. She'll be seen back as needed for follow-up.

## 2014-12-21 ENCOUNTER — Other Ambulatory Visit: Payer: Self-pay | Admitting: Podiatrist

## 2014-12-23 ENCOUNTER — Other Ambulatory Visit: Payer: Self-pay | Admitting: Podiatrist

## 2014-12-24 ENCOUNTER — Ambulatory Visit: Payer: Medicare Other | Admitting: Internal Medicine

## 2014-12-24 ENCOUNTER — Ambulatory Visit (INDEPENDENT_AMBULATORY_CARE_PROVIDER_SITE_OTHER): Payer: Self-pay | Admitting: Neurology

## 2014-12-24 ENCOUNTER — Encounter: Payer: Self-pay | Admitting: Neurology

## 2014-12-24 VITALS — BP 137/66 | HR 94 | Wt 136.2 lb

## 2014-12-24 DIAGNOSIS — G3184 Mild cognitive impairment, so stated: Secondary | ICD-10-CM

## 2014-12-24 NOTE — Patient Instructions (Signed)
I had a long discussion with the patient with regards to her remote stroke, reviewed available imaging studies and chart and answered questions. I recommend she continue aspirin for secondary stroke prevention and maintain strict control of hypertension with blood pressure goal below 140/90, diabetes with hemoglobin A1c goal below 7% and lipids with LDL cholesterol goal below 70 mg percent. He also had a brief discussion about mild cognitive impairment that she has which is likely multifactorial due to combination of stroke related damage as well as underlying suboptimally treated depression. I recommend increasing Wellbutrin to 2 tablets twice daily or to discuss with her primary physician today at the upcoming visit to change to alternative medication. Return for follow-up in 6 months or call earlier if necessary. Memory Compensation Strategies  1. Use "WARM" strategy.  W= write it down  A= associate it  R= repeat it  M= make a mental note  2.   You can keep a Social worker.  Use a 3-ring notebook with sections for the following: calendar, important names and phone numbers,  medications, doctors' names/phone numbers, lists/reminders, and a section to journal what you did  each day.   3.    Use a calendar to write appointments down.  4.    Write yourself a schedule for the day.  This can be placed on the calendar or in a separate section of the Memory Notebook.  Keeping a  regular schedule can help memory.  5.    Use medication organizer with sections for each day or morning/evening pills.  You may need help loading it  6.    Keep a basket, or pegboard by the door.  Place items that you need to take out with you in the basket or on the pegboard.  You may also want to  include a message board for reminders.  7.    Use sticky notes.  Place sticky notes with reminders in a place where the task is performed.  For example: " turn off the  stove" placed by the stove, "lock the door" placed on  the door at eye level, " take your medications" on  the bathroom mirror or by the place where you normally take your medications.  8.    Use alarms/timers.  Use while cooking to remind yourself to check on food or as a reminder to take your medicine, or as a  reminder to make a call, or as a reminder to perform another task, etc.

## 2014-12-24 NOTE — Progress Notes (Signed)
PATIENT: Sonia Skinner DOB: 11-Mar-1951  REASON FOR VISIT: routine follow up for stroke HISTORY FROM: patient  HISTORY OF PRESENT ILLNESS: Sonia Skinner is a 64 y.o. female with a history of hypertension and diabetes mellitus who comes in for first office follow up after hospital discharge for stroke.  She presented at [facility] on 11/01/13 with a complaint of intermittent blurring of vision involving left eye for about one week, and new onset weakness involving right upper extremity. CT scan of her head showed no acute intracranial abnormality except for possible abnormal density involving the left frontal lobe superiorly, suggestive of possible subacute stroke. Patient had not been on antiplatelet therapy. Has no previous history of stroke nor TIA. NIH stroke score was 1 for pronation and drift of right upper extremity. Work up at that time revealed an elevated A1c 10.5 and LDL 147. MRI brain showed patchy acute left MCA infarct.  There was critical left ICA stenosis by CTA. Right carotid revealed a plaque at the bifurcation. Vertebrals were patent. MRA showed Moderate to marked tandem stenosis of the internal carotid artery cavernous segment bilaterally. Moderate to marked narrowing proximal left middle cerebral artery branch. Moderate narrowing involving portions of the posterior inferior cerebellar artery bilaterally. Poor delineation left anterior inferior cerebellar artery. Small caliber right superior cerebellar artery. Echocardiogram showed no intracardiac masses or thrombi. She underwent left CEA, with a stenosis of 95%, on 11/04/2013 by Dr. Scot Dock and was discharged from the hospital on 11/06/13 on aspirin.  She awakened at about 11PM 11/24/2013 and was dizzy but was able to eat and went back to bed. When she awakened the next morning morning and started to wash her face she noted that the left side of her face was numb. She went on to church and started to feel even worse. Became dizzy and nauseous and  felt numb on the entire left side. Patient called EMS at that time. MRI was negative for acute infarct. A week later the left facial numbness went back to baseline, the nausea resolved by the next morning.  She still has loss of fine motor skill in left hand. She finished physical therapy, is left hand dominant, still has some right arm loss of dexterity, denies right leg weakness. She states still some pulling feeling at left side of neck, the area is tender but well healed. Has keloid scar at left CEA incision.  Her blood pressure is well controlled, it is 143/60 in office today. She is tolerating her daily aspirin well without significant bruising or bleeding. She is not yet had her cholesterol rechecked since the hospital and starting on Lipitor. She has no known side effects with Lipitor. Her morning blood sugars run between 100-200. She states that she is working on his and trying to change her diet.  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 She hopes that at the start of the school year she can go back as a Optometrist. She plans to work until age 69 to get full retirement.  Update 05/14/14 (LL): Since last visit, she has been well but reports being more tired. She is finding it harder to keep up with her tasks at her job. She feels that she is not as organized as before and could not multi-task easily anymore. She states that she is much quicker to anger now. She states her blood pressure is well controlled, though it  is elevated in the office today at 156/67. She is tolerating her daily aspirin well without significant bruising or bleeding. Last hgb a1c was 9.4, but she is working closely with PCP to get better control of DM; hopes to start Byetta this week. Hopes it will help her lose the 20 lbs. she has gained recently.  She is tolerating aspirin well with no signs of significant bleeding or bruising. Update 12/24/2014 :  She returns for follow-up after last visit 8 months ago. She complained of cognitive difficulties post stroke and Dr. Jannifer Franklin referred her to Dr. Willette Alma and for neuro psych testing which was done on 11/19/14 which shows significant underlying anxiety depression as well as post stroke mild cognitive impairment. Patient has been on Wellbutrin but for smoking cessation but it is not helping her depression. She also complains of mild tremors in hands. She's not had any recurrent stroke or TIA symptoms. She remains on aspirin which is tolerating well without bleeding or bruising. Her blood pressure is well controlled and it is 130/74 today. She has a primary care physician visit later today and transfer of lipid profile checked. She did have follow-up carotid ultrasound done in March this year and Dr. Mee Hives office and it was fine.  REVIEW OF SYSTEMS: Full 14 system review of systems performed and notable only for:  Fatigue, activity change, hearing loss, blurred vision, frequent waking, daytime sleepiness, back pain, memory loss, headache, weakness, tremors, agitation, decreased concentration and depression all other systems negative    ALLERGIES: Allergies  Allergen Reactions  . Anesthetics, Ester Anaphylaxis    HOME MEDICATIONS: Outpatient Prescriptions Prior to Visit  Medication Sig Dispense Refill  . acetaminophen (TYLENOL) 325 MG tablet Take 650 mg by mouth every 6 (six) hours as needed for headache.    Marland Kitchen amLODipine (NORVASC) 5 MG tablet TAKE 1 TABLET BY MOUTH DAILY. 30 tablet 3  . aspirin 325 MG tablet Take 1 tablet (325 mg total) by mouth daily.    Marland Kitchen atorvastatin (LIPITOR) 40 MG tablet TAKE 1 TABLET BY MOUTH DAILY AT 6 PM. 30 tablet 2  . buPROPion (WELLBUTRIN SR) 150 MG 12 hr tablet TAKE 2 TABLETS IN THE MORNING AND 1 TABLET AT NIGHT 90 tablet 0  . insulin glargine (LANTUS) 100 UNIT/ML injection Inject 0.28 mLs (28 Units total) into the skin at bedtime. 10 mL 5  . lisinopril-hydrochlorothiazide  (PRINZIDE,ZESTORETIC) 20-25 MG per tablet Take 1 tablet by mouth daily. 30 tablet 0  . loratadine (CLARITIN) 10 MG tablet Take 1 tablet (10 mg total) by mouth daily. 30 tablet 0  . nicotine (NICODERM CQ - DOSED IN MG/24 HOURS) 14 mg/24hr patch Place 1 patch (14 mg total) onto the skin daily. 28 patch 0  . omeprazole (PRILOSEC) 40 MG capsule TAKE 1 CAPSULE BY MOUTH ONCE DAILY. 30 capsule 3  . SYNTHROID 125 MCG tablet TAKE ONE TABLET BY MOUTH DAILY. 90 tablet 3  . terbinafine (LAMISIL) 250 MG tablet Take 1 tablet (250 mg total) by mouth daily. 30 tablet 0  . traMADol (ULTRAM) 50 MG tablet Take 1 tablet (50 mg total) by mouth 2 (two) times daily. 60 tablet 0  . exenatide (BYETTA) 10 MCG/0.04ML SOPN injection Inject 0.02 mLs (5 mcg total) into the skin 2 (two) times daily with a meal. 2.4 mL 3  . docusate sodium 100 MG CAPS Take 100 mg by mouth daily. 10 capsule 0   No facility-administered medications prior to visit.    PHYSICAL EXAM Filed Vitals:  12/24/14 1602  BP: 137/66  Pulse: 94  Weight: 136 lb 3.2 oz (61.78 kg)   Body mass index is 24.91 kg/(m^2).  Generalized: Well developed, in no acute distress, pleasant AA female Head: normocephalic and atraumatic.   Neck: Supple, no carotid bruits, left CEA scar with mild keloid Cardiac: Regular rate rhythm, no murmur  Musculoskeletal: No deformity   Neurological examination  Mentation: Alert oriented to time, place, history taking. Follows all commands speech and language fluent. Recall 3/3. Animal naming 9. Intact calculation. Difficulty with multitasking Cranial nerve II-XII: Fundoscopic exam not done. Pupils were equal round reactive to light extraocular movements were full, visual field were full on confrontational test. Facial sensation mildly decreased on the right but strength is normal bilaterally. Hearing was intact to finger rubbing bilaterally. Uvula tongue midline. head turning and shoulder shrug and were normal and  symmetric.Tongue protrusion into cheek strength was normal. Motor: The motor testing reveals 5 over 5 strength of all 4 extremities. Good symmetric motor tone is noted throughout. Mildly decreased fine motor skills in left hand. Sensory: Sensory testing is intact to soft touch on all 4 extremities. No evidence of extinction is noted.  Coordination: Cerebellar testing reveals good finger-nose-finger and heel-to-shin bilaterally.  Gait and station: Gait is normal. Tandem gait is normal. Romberg is negative. Reflexes: Deep tendon reflexes are symmetric and normal bilaterally.      ASSESSMENT: 64 y.o. year old female  has a past medical history of Hypertension; Thyroid disease; Complication of anesthesia; Diabetes mellitus; GERD (gastroesophageal reflux disease); Carotid artery occlusion; and left MCA stroke on 11/01/2013. Status post left CEA by Dr. Doren Custard. Left sided weakness occurred approximately 3 weeks later but no acute infarct was seen on MRI. Vascular risk factors of diabetes, former smoker, hypertension, and hyperlipidemia. Mild cognitive difficulties post stroke likely mild cognitive impairment with some underlying  Suboptimally treated depression  PLAN: I had a long discussion with the patient with regards to her remote stroke, reviewed available imaging studies and chart and answered questions. I recommend she continue aspirin for secondary stroke prevention and maintain strict control of hypertension with blood pressure goal below 140/90, diabetes with hemoglobin A1c goal below 7% and lipids with LDL cholesterol goal below 70 mg percent. He also had a brief discussion about mild cognitive impairment that she has which is likely multifactorial due to combination of stroke related damage as well as underlying suboptimally treated depression. I recommend increasing Wellbutrin to 2 tablets twice daily or to discuss with her primary physician today at the upcoming visit to change to alternative  medication. Return for follow-up in 6 months or call earlier if necessary. Antony Contras, MD  12/24/2014, 5:22 PM [facility] [address], [place], [place] [address] [phone]  Note: This document was prepared with digital dictation and possible smart phrase technology. Any transcriptional errors that result from this process are unintentional.

## 2014-12-26 ENCOUNTER — Encounter: Payer: Self-pay | Admitting: Internal Medicine

## 2014-12-26 ENCOUNTER — Ambulatory Visit: Payer: Medicare Other | Attending: Internal Medicine | Admitting: Internal Medicine

## 2014-12-26 ENCOUNTER — Other Ambulatory Visit: Payer: Self-pay | Admitting: Internal Medicine

## 2014-12-26 VITALS — BP 146/75 | HR 68 | Wt 138.2 lb

## 2014-12-26 DIAGNOSIS — I6523 Occlusion and stenosis of bilateral carotid arteries: Secondary | ICD-10-CM | POA: Diagnosis not present

## 2014-12-26 DIAGNOSIS — F329 Major depressive disorder, single episode, unspecified: Secondary | ICD-10-CM | POA: Diagnosis not present

## 2014-12-26 DIAGNOSIS — F32A Depression, unspecified: Secondary | ICD-10-CM

## 2014-12-26 DIAGNOSIS — E119 Type 2 diabetes mellitus without complications: Secondary | ICD-10-CM

## 2014-12-26 DIAGNOSIS — E785 Hyperlipidemia, unspecified: Secondary | ICD-10-CM | POA: Diagnosis not present

## 2014-12-26 DIAGNOSIS — E039 Hypothyroidism, unspecified: Secondary | ICD-10-CM | POA: Diagnosis not present

## 2014-12-26 DIAGNOSIS — I10 Essential (primary) hypertension: Secondary | ICD-10-CM

## 2014-12-26 LAB — GLUCOSE, POCT (MANUAL RESULT ENTRY): POC Glucose: 127 mg/dl — AB (ref 70–99)

## 2014-12-26 LAB — POCT GLYCOSYLATED HEMOGLOBIN (HGB A1C): Hemoglobin A1C: 9.9

## 2014-12-26 MED ORDER — BUPROPION HCL ER (SR) 150 MG PO TB12: ORAL_TABLET | ORAL | Status: DC

## 2014-12-26 MED ORDER — ATORVASTATIN CALCIUM 40 MG PO TABS: ORAL_TABLET | ORAL | Status: DC

## 2014-12-26 MED ORDER — BYETTA 5 MCG PEN 5 MCG/0.02ML ~~LOC~~ SOPN: 5.0000 ug | PEN_INJECTOR | Freq: Two times a day (BID) | SUBCUTANEOUS | Status: DC

## 2014-12-26 MED ORDER — AMLODIPINE BESYLATE 5 MG PO TABS: 5.0000 mg | ORAL_TABLET | Freq: Every day | ORAL | Status: DC

## 2014-12-26 NOTE — Progress Notes (Signed)
Patient ID: Sonia Skinner, female   DOB: 03/30/1951, 64 y.o.   MRN: [id number]  CC:DM follow up HPI: Sonia Skinner is a 64 y.o. female here today for a follow up visit.  Patient has past medical history of HTN, thyroid disease, T2DM, and CVA.  She reports that she went to see a neurologist for some test and was told that she was not suffering from any neurologic conditions but likely from depression. She states that she has been having memory issues and forgot to ask about her persistent headaches.  Headaches are weekly starting in the crown of her head. They are throbbing in nature. She was previously taking Tramadol for pain.  Lower back pain that is achy. She denies bowel or bladder dysfunction.  Blood sugar log reveals fasting cbg's of 121-330. She takes her medication daily without any skipped doses. She is now up to 30 units of Lantus nightly.     Allergies  Allergen Reactions  . Anesthetics, Ester Anaphylaxis   Past Medical History  Diagnosis Date  . Hypertension   . Thyroid disease   . Complication of anesthesia     ALLERY TO ESTER BASE  . Diabetes mellitus     INSULIN DEPENDENT  . GERD (gastroesophageal reflux disease)   . Carotid artery occlusion   . Stroke March 2015     left MCA infarct  . Depression   . Anxiety   . Headache    Current Outpatient Prescriptions on File Prior to Visit  Medication Sig Dispense Refill  . acetaminophen (TYLENOL) 325 MG tablet Take 650 mg by mouth every 6 (six) hours as needed for headache.    Marland Kitchen amLODipine (NORVASC) 5 MG tablet TAKE 1 TABLET BY MOUTH DAILY. 30 tablet 3  . aspirin 325 MG tablet Take 1 tablet (325 mg total) by mouth daily.    Marland Kitchen atorvastatin (LIPITOR) 40 MG tablet TAKE 1 TABLET BY MOUTH DAILY AT 6 PM. 30 tablet 2  . buPROPion (WELLBUTRIN SR) 150 MG 12 hr tablet TAKE 2 TABLETS IN THE MORNING AND 1 TABLET AT NIGHT 90 tablet 0  . BYETTA 5 MCG PEN 5 MCG/0.02ML SOPN injection     . insulin glargine (LANTUS) 100 UNIT/ML injection Inject  0.28 mLs (28 Units total) into the skin at bedtime. 10 mL 5  . lisinopril-hydrochlorothiazide (PRINZIDE,ZESTORETIC) 20-25 MG per tablet Take 1 tablet by mouth daily. 30 tablet 0  . loratadine (CLARITIN) 10 MG tablet Take 1 tablet (10 mg total) by mouth daily. 30 tablet 0  . nicotine (NICODERM CQ - DOSED IN MG/24 HOURS) 14 mg/24hr patch Place 1 patch (14 mg total) onto the skin daily. 28 patch 0  . omeprazole (PRILOSEC) 40 MG capsule TAKE 1 CAPSULE BY MOUTH ONCE DAILY. 30 capsule 3  . SYNTHROID 125 MCG tablet TAKE ONE TABLET BY MOUTH DAILY. 90 tablet 3  . terbinafine (LAMISIL) 250 MG tablet Take 1 tablet (250 mg total) by mouth daily. 30 tablet 0  . traMADol (ULTRAM) 50 MG tablet Take 1 tablet (50 mg total) by mouth 2 (two) times daily. 60 tablet 0   No current facility-administered medications on file prior to visit.   Family History  Problem Relation Age of Onset  . Diabetes Mother   . Heart disease Mother     Before age 42  . Cancer Father     Lung  . Hypertension Sister   . Diabetes Sister   . Diabetes Sister   . Diabetes Sister  History   Social History  . Marital Status: Legally Separated    Spouse Name: N/A  . Number of Children: 0  . Years of Education: College   Occupational History  . [employer] History Main Topics  . Smoking status: Former Smoker -- 0.20 packs/day for 20 years    Types: Cigarettes    Quit date: 11/01/2013  . Smokeless tobacco: Never Used  . Alcohol Use: No  . Drug Use: No  . Sexual Activity: No   Other Topics Concern  . Not on file   Social History Narrative   Patient lives at home with sister.   Caffeine Use: 2 cups daily; sodas occasionally    Review of Systems  Musculoskeletal: Positive for back pain.  Neurological: Positive for tingling and headaches. Negative for dizziness.  Psychiatric/Behavioral: Positive for depression and memory loss. Negative for suicidal ideas and substance abuse. The patient is not  nervous/anxious and does not have insomnia.   All other systems reviewed and are negative.     Objective:   Filed Vitals:   12/26/14 1709  BP: 146/75  Pulse: 68    Physical Exam  Constitutional: She is oriented to person, place, and time.  Cardiovascular: Normal rate, regular rhythm and normal heart sounds.   Pulmonary/Chest: Effort normal and breath sounds normal.  Musculoskeletal: Normal range of motion. She exhibits no edema.  Neurological: She is alert and oriented to person, place, and time. No cranial nerve deficit.     Lab Results  Component Value Date   WBC 12.1* 11/29/2013   HGB 13.1 11/29/2013   HCT 39.6 11/29/2013   MCV 83.0 11/29/2013   PLT 232 11/29/2013   Lab Results  Component Value Date   CREATININE 1.23* 09/18/2014   BUN 17 09/18/2014   NA 140 09/18/2014   K 4.1 09/18/2014   CL 99 09/18/2014   CO2 27 09/18/2014    Lab Results  Component Value Date   HGBA1C 9.90 12/26/2014   Lipid Panel     Component Value Date/Time   CHOL 184 09/18/2014 1620   TRIG 147 09/18/2014 1620   HDL 50 09/18/2014 1620   CHOLHDL 3.7 09/18/2014 1620   VLDL 29 09/18/2014 1620   LDLCALC 105* 09/18/2014 1620       Assessment and plan:   Franchelle was seen today for diabetes follow up.  Diagnoses and all orders for this visit:  Type 2 diabetes mellitus without complication Orders: -     Glucose (CBG) -     HgB A1c -     Microalbumin, urine -     BYETTA 5 MCG PEN 5 MCG/0.02ML SOPN injection; Inject 0.02 mLs (5 mcg total) into the skin 2 (two) times daily with a meal. -     Hepatic function panel Increased lantus by 3-5 units per night.   Essential hypertension Orders: Patient will increase amlodipine to 10 mg per day. She is not controlled on 5 mg. Went over [employer].  HLD (hyperlipidemia) Orders: -     atorvastatin (LIPITOR) 40 MG tablet; TAKE 1 TABLET BY MOUTH DAILY AT 6 PM. Education provided on proper lifestyle changes in order to lower cholesterol.  Patient advised to maintain healthy weight and to keep total fat intake at 25-35% of total calories and carbohydrates 50-60% of total daily calories. Explained how high cholesterol places patient at risk for heart disease. Patient placed on appropriate medication and repeat labs in 6 months   Depression Orders: -  Ambulatory referral to Psychiatry -     buPROPion (WELLBUTRIN SR) 150 MG 12 hr tablet; TAKE 2 TABLETS IN THE MORNING AND 1 TABLET AT NIGHT Psychiatry will likely d/c wellbutrin and place her on SSRI. I have explained that pain may improve with depression treatment.   Hypothyroidism, unspecified hypothyroidism type Orders: -     TSH -     T4, Free Will make sure thyroid is not causing some depression like symptoms  Return in about 3 months (around 03/28/2015) for DM/HTN.    Chari Manning, NP-C [facility] [phone] 12/26/2014, 5:31 PM '

## 2014-12-26 NOTE — Patient Instructions (Signed)

## 2014-12-26 NOTE — Progress Notes (Deleted)
Subjective:     Patient ID: Sonia Skinner, female   DOB: 1951/02/10, 64 y.o.   MRN: [id number]  HPI   Review of Systems Pt is here for a 3 month follow up on Diabetes.    Objective:   Physical Exam     Assessment:     ***    Plan:     ***

## 2014-12-27 LAB — MICROALBUMIN, URINE: Microalb, Ur: 1.7 mg/dL (ref <2.0)

## 2014-12-27 LAB — TSH: TSH: 0.051 u[IU]/mL — ABNORMAL LOW (ref 0.350–4.500)

## 2014-12-27 LAB — T4, FREE: Free T4: 2.12 ng/dL — ABNORMAL HIGH (ref 0.80–1.80)

## 2014-12-27 MED ORDER — AMLODIPINE BESYLATE 10 MG PO TABS: 10.0000 mg | ORAL_TABLET | Freq: Every day | ORAL | Status: DC

## 2014-12-27 NOTE — Telephone Encounter (Signed)
Left HIPAA compliant message on patient's home and mobile VM to return call.

## 2014-12-27 NOTE — Telephone Encounter (Addendum)
-----   Message from Lance Bosch, NP sent at 12/26/2014 11:23 PM EDT ----- Regarding: med change Increase her amlodipine to 10 mg per day. I have sent over a new prescription for 10 mg. She may take 2 of the 5 mg until she is complete. Thanks.

## 2014-12-28 ENCOUNTER — Other Ambulatory Visit: Payer: Self-pay | Admitting: Podiatrist

## 2014-12-28 ENCOUNTER — Encounter: Payer: Self-pay | Admitting: Internal Medicine

## 2014-12-28 ENCOUNTER — Other Ambulatory Visit: Payer: Self-pay | Admitting: Internal Medicine

## 2014-12-28 DIAGNOSIS — E039 Hypothyroidism, unspecified: Secondary | ICD-10-CM

## 2014-12-28 MED ORDER — LEVOTHYROXINE SODIUM 100 MCG PO TABS: 100.0000 ug | ORAL_TABLET | Freq: Every day | ORAL | Status: DC

## 2014-12-30 LAB — HEPATIC FUNCTION PANEL
ALT: 25 U/L (ref 0–35)
AST: 14 U/L (ref 0–37)
Albumin: 4.1 g/dL (ref 3.5–5.2)
Alkaline Phosphatase: 124 U/L — ABNORMAL HIGH (ref 39–117)
Bilirubin, Direct: 0.1 mg/dL (ref 0.0–0.3)
Indirect Bilirubin: 0.2 mg/dL (ref 0.2–1.2)
Total Bilirubin: 0.3 mg/dL (ref 0.2–1.2)
Total Protein: 6.8 g/dL (ref 6.0–8.3)

## 2015-01-01 ENCOUNTER — Telehealth: Payer: Self-pay | Admitting: *Deleted

## 2015-01-01 NOTE — Telephone Encounter (Signed)
Left HIPPA compliat message to call clinic for results of her lab work and PCP plan

## 2015-01-01 NOTE — Telephone Encounter (Signed)
-----   Message from Lance Bosch, NP sent at 12/28/2014  9:37 AM EDT ----- I have decreased patients synthroid down to 100 mcg daily. Her levels are low showing that she is getting too much medication. Have here to come back  in 8 weeks for a repeat TSH and free T4. Thanks. Orders have been placed

## 2015-01-02 ENCOUNTER — Telehealth: Payer: Self-pay | Admitting: Internal Medicine

## 2015-01-02 ENCOUNTER — Telehealth: Payer: Self-pay | Admitting: *Deleted

## 2015-01-02 NOTE — Telephone Encounter (Signed)
Gave patient Sonia Skinner plan to decrease her thyroid medication and to come in in 2 months for repeat lab tests.  Patient verbalized understanding.

## 2015-01-02 NOTE — Telephone Encounter (Signed)
Patient came into facility stating that Byetta 5mg  needs prior authorization through her insurance in order to get the medication refilled. Please f/u with pt.

## 2015-01-05 ENCOUNTER — Encounter: Payer: Self-pay | Admitting: Internal Medicine

## 2015-01-06 ENCOUNTER — Other Ambulatory Visit: Payer: Self-pay | Admitting: *Deleted

## 2015-01-06 ENCOUNTER — Other Ambulatory Visit: Payer: Self-pay | Admitting: Podiatrist

## 2015-01-07 ENCOUNTER — Telehealth: Payer: Self-pay | Admitting: *Deleted

## 2015-01-07 NOTE — Telephone Encounter (Signed)
Sonia Skinner please complete prior auth for patient and send off. Reason being she is has been on other insulins and failed therapy

## 2015-01-07 NOTE — Telephone Encounter (Signed)
Called patient and left HIPAA compliant message to return call.  Patient's Byetta medication requires prior authorization and additional medical information is necessary before claim can be submitted-routed to PCP.

## 2015-01-08 NOTE — Telephone Encounter (Signed)
-----   Message from Lance Bosch, NP sent at 12/28/2014  9:37 AM EDT ----- I have decreased patients synthroid down to 100 mcg daily. Her levels are low showing that she is getting too much medication. Have here to come back  in 8 weeks for a repeat TSH and free T4. Thanks. Orders have been placed

## 2015-01-08 NOTE — Telephone Encounter (Signed)
Spoke with patient and gave instructions regarding her synthroid.  Patient has new medication and knows to come in for lab work.  Resent prior auth to [employer] for [employer].

## 2015-01-14 ENCOUNTER — Encounter: Payer: Self-pay | Admitting: Internal Medicine

## 2015-01-15 ENCOUNTER — Telehealth: Payer: Self-pay | Admitting: *Deleted

## 2015-01-15 ENCOUNTER — Other Ambulatory Visit: Payer: Self-pay | Admitting: Internal Medicine

## 2015-01-15 NOTE — Telephone Encounter (Signed)
-----   Message from Lance Bosch, NP sent at 01/13/2015 12:53 PM EDT ----- Liver function is fine, okay for patient to continue Lamisil if needed by her podiatrist

## 2015-01-15 NOTE — Telephone Encounter (Signed)
Relayed information to patient

## 2015-01-16 ENCOUNTER — Telehealth: Payer: Self-pay | Admitting: Clinical

## 2015-01-16 NOTE — Telephone Encounter (Signed)
Left message, attempt f/u call

## 2015-01-17 ENCOUNTER — Telehealth: Payer: Self-pay | Admitting: General Practice

## 2015-01-17 NOTE — Telephone Encounter (Signed)
Patient presents to clinic to drop off paper work from her insurance [facility]) for prior authorization to change prescription from Byetta to Victoza 2-Pak/3-Pak Placed forms in provider box. Please assist.

## 2015-01-18 ENCOUNTER — Encounter: Payer: Self-pay | Admitting: Internal Medicine

## 2015-01-20 ENCOUNTER — Other Ambulatory Visit: Payer: Self-pay | Admitting: Internal Medicine

## 2015-01-20 DIAGNOSIS — E1141 Type 2 diabetes mellitus with diabetic mononeuropathy: Secondary | ICD-10-CM

## 2015-01-20 MED ORDER — LIRAGLUTIDE 18 MG/3ML ~~LOC~~ SOPN: 0.6000 mg | PEN_INJECTOR | Freq: Every day | SUBCUTANEOUS | Status: DC

## 2015-01-22 ENCOUNTER — Other Ambulatory Visit: Payer: Self-pay | Admitting: Internal Medicine

## 2015-01-28 ENCOUNTER — Other Ambulatory Visit: Payer: Self-pay | Admitting: Podiatrist

## 2015-01-28 MED ORDER — TERBINAFINE HCL 250 MG PO TABS: 250.0000 mg | ORAL_TABLET | Freq: Every day | ORAL | Status: DC

## 2015-01-31 ENCOUNTER — Other Ambulatory Visit: Payer: Self-pay | Admitting: Internal Medicine

## 2015-02-10 ENCOUNTER — Other Ambulatory Visit: Payer: Self-pay | Admitting: Internal Medicine

## 2015-02-19 ENCOUNTER — Other Ambulatory Visit: Payer: Self-pay | Admitting: Internal Medicine

## 2015-02-20 ENCOUNTER — Other Ambulatory Visit: Payer: Self-pay | Admitting: *Deleted

## 2015-02-20 DIAGNOSIS — E1165 Type 2 diabetes mellitus with hyperglycemia: Secondary | ICD-10-CM

## 2015-02-20 MED ORDER — GLUCOSE BLOOD VI STRP: ORAL_STRIP | Status: DC

## 2015-02-20 MED ORDER — ONETOUCH DELICA LANCETS 33G MISC: 1.0000 | Freq: Three times a day (TID) | Status: DC

## 2015-02-21 ENCOUNTER — Other Ambulatory Visit: Payer: Self-pay

## 2015-02-21 MED ORDER — TRAMADOL HCL 50 MG PO TABS: 50.0000 mg | ORAL_TABLET | Freq: Two times a day (BID) | ORAL | Status: DC

## 2015-02-21 MED ORDER — LORATADINE 10 MG PO TABS: 10.0000 mg | ORAL_TABLET | Freq: Every day | ORAL | Status: DC

## 2015-02-21 NOTE — Progress Notes (Unsigned)
Patient called requesting a refill on her tramadol Prescription printed Waiting on signature

## 2015-03-12 ENCOUNTER — Other Ambulatory Visit: Payer: Self-pay

## 2015-03-12 ENCOUNTER — Telehealth: Payer: Self-pay | Admitting: General Practice

## 2015-03-12 ENCOUNTER — Other Ambulatory Visit: Payer: Self-pay | Admitting: Internal Medicine

## 2015-03-12 DIAGNOSIS — E1165 Type 2 diabetes mellitus with hyperglycemia: Secondary | ICD-10-CM

## 2015-03-12 MED ORDER — ACCU-CHEK SOFTCLIX LANCETS MISC: Status: DC

## 2015-03-12 MED ORDER — GLUCOSE BLOOD VI STRP: ORAL_STRIP | Status: DC

## 2015-03-12 MED ORDER — ACCU-CHEK AVIVA PLUS W/DEVICE KIT: 1.0000 | PACK | Freq: Three times a day (TID) | Status: DC

## 2015-03-12 NOTE — Telephone Encounter (Signed)
Patient call stating she is at [facility] and they have advised her that her diabetic supply is not there. Patient then put pharmacist from [facility] on the phone and pharmacist confirmed that they have not received anything for this  Patient. Nursing Supervisor Nira Conn will assist patient with refills. Heather resent refill request to [facility].

## 2015-03-18 ENCOUNTER — Other Ambulatory Visit: Payer: Self-pay | Admitting: Internal Medicine

## 2015-03-18 DIAGNOSIS — E1141 Type 2 diabetes mellitus with diabetic mononeuropathy: Secondary | ICD-10-CM

## 2015-03-18 MED ORDER — GLUCOSE BLOOD VI STRP: ORAL_STRIP | Status: DC

## 2015-03-18 MED ORDER — ACCU-CHEK AVIVA PLUS W/DEVICE KIT: PACK | Status: DC

## 2015-03-18 MED ORDER — ACCU-CHEK SOFTCLIX LANCETS MISC: Status: DC

## 2015-03-25 ENCOUNTER — Other Ambulatory Visit: Payer: Self-pay | Admitting: Internal Medicine

## 2015-03-25 DIAGNOSIS — E1141 Type 2 diabetes mellitus with diabetic mononeuropathy: Secondary | ICD-10-CM

## 2015-03-25 MED ORDER — ACCU-CHEK AVIVA PLUS W/DEVICE KIT: PACK | Status: DC

## 2015-03-25 MED ORDER — ACCU-CHEK SOFTCLIX LANCETS MISC: Status: DC

## 2015-03-25 MED ORDER — GLUCOSE BLOOD VI STRP: ORAL_STRIP | Status: DC

## 2015-04-09 ENCOUNTER — Other Ambulatory Visit: Payer: Self-pay | Admitting: Internal Medicine

## 2015-04-10 ENCOUNTER — Encounter: Payer: Self-pay | Admitting: Internal Medicine

## 2015-04-10 ENCOUNTER — Other Ambulatory Visit: Payer: Self-pay | Admitting: Internal Medicine

## 2015-04-15 ENCOUNTER — Encounter (HOSPITAL_BASED_OUTPATIENT_CLINIC_OR_DEPARTMENT_OTHER): Payer: Self-pay | Admitting: Emergency Medicine

## 2015-04-15 ENCOUNTER — Emergency Department (HOSPITAL_BASED_OUTPATIENT_CLINIC_OR_DEPARTMENT_OTHER)
Admission: EM | Admit: 2015-04-15 | Discharge: 2015-04-16 | Disposition: A | Discharge status: Home / Self Care | Payer: Medicare Other | Attending: Emergency Medicine | Admitting: Emergency Medicine

## 2015-04-15 ENCOUNTER — Emergency Department (HOSPITAL_BASED_OUTPATIENT_CLINIC_OR_DEPARTMENT_OTHER): Payer: Medicare Other

## 2015-04-15 DIAGNOSIS — K219 Gastro-esophageal reflux disease without esophagitis: Secondary | ICD-10-CM | POA: Insufficient documentation

## 2015-04-15 DIAGNOSIS — E079 Disorder of thyroid, unspecified: Secondary | ICD-10-CM | POA: Insufficient documentation

## 2015-04-15 DIAGNOSIS — Z8673 Personal history of transient ischemic attack (TIA), and cerebral infarction without residual deficits: Secondary | ICD-10-CM | POA: Diagnosis not present

## 2015-04-15 DIAGNOSIS — J069 Acute upper respiratory infection, unspecified: Secondary | ICD-10-CM | POA: Diagnosis not present

## 2015-04-15 DIAGNOSIS — R05 Cough: Secondary | ICD-10-CM | POA: Diagnosis not present

## 2015-04-15 DIAGNOSIS — Z794 Long term (current) use of insulin: Secondary | ICD-10-CM | POA: Insufficient documentation

## 2015-04-15 DIAGNOSIS — I10 Essential (primary) hypertension: Secondary | ICD-10-CM | POA: Insufficient documentation

## 2015-04-15 DIAGNOSIS — E119 Type 2 diabetes mellitus without complications: Secondary | ICD-10-CM | POA: Diagnosis not present

## 2015-04-15 DIAGNOSIS — F419 Anxiety disorder, unspecified: Secondary | ICD-10-CM | POA: Insufficient documentation

## 2015-04-15 DIAGNOSIS — Z7982 Long term (current) use of aspirin: Secondary | ICD-10-CM | POA: Diagnosis not present

## 2015-04-15 DIAGNOSIS — Z79899 Other long term (current) drug therapy: Secondary | ICD-10-CM | POA: Diagnosis not present

## 2015-04-15 DIAGNOSIS — Z87891 Personal history of nicotine dependence: Secondary | ICD-10-CM | POA: Diagnosis not present

## 2015-04-15 DIAGNOSIS — F329 Major depressive disorder, single episode, unspecified: Secondary | ICD-10-CM | POA: Diagnosis not present

## 2015-04-15 NOTE — ED Notes (Signed)
Pt c/o productive cough with nasal congestion for the last two weeks, denies fevers, lungs clear, not relieved by OTC cough medication.

## 2015-04-15 NOTE — ED Provider Notes (Signed)
CSN: [id number]     Arrival date & time 04/15/15  2321 History   This chart was scribe for Thaddeaus Monica, MD by Judithann Sauger, ED Scribe. The patient was seen in room MH01/MH01 and the patient's care was started at 11:55 PM.     Chief Complaint  Patient presents with  . Cough   Patient is a 64 y.o. female presenting with cough. The history is provided by the patient. No language interpreter was used.  Cough Cough characteristics:  Non-productive Severity:  Mild Onset quality:  Gradual Timing:  Rare Progression:  Unchanged Chronicity:  New Context: not animal exposure   Relieved by:  Nothing Worsened by:  Nothing tried Associated symptoms: no chills, no fever, no shortness of breath and no wheezing   Risk factors: no chemical exposure and no recent travel    HPI Comments: Sonia Skinner is a 64 y.o. female who presents to the Emergency Department complaining of cough onset 2 weeks ago. She reports associated nasal congestion. She denies any fever. She reports that she was on Penicillin and Robitussin for 2 weeks after she had an URI.    Past Medical History  Diagnosis Date  . Hypertension   . Thyroid disease   . Complication of anesthesia     ALLERY TO ESTER BASE  . Diabetes mellitus     INSULIN DEPENDENT  . GERD (gastroesophageal reflux disease)   . Carotid artery occlusion   . Stroke March 2015     left MCA infarct  . Depression   . Anxiety   . Headache    Past Surgical History  Procedure Laterality Date  . Abdominal hysterectomy    . Endarterectomy Left 11/04/2013  . Carotid endarterectomy Left 11-04-13    cea   Family History  Problem Relation Age of Onset  . Diabetes Mother   . Heart disease Mother     Before age 73  . Cancer Father     Lung  . Hypertension Sister   . Diabetes Sister   . Diabetes Sister   . Diabetes Sister    Social History  Substance Use Topics  . Smoking status: Former Smoker -- 0.20 packs/day for 20 years    Types: Cigarettes     Quit date: 11/01/2013  . Smokeless tobacco: Never Used  . Alcohol Use: No   OB History    No data available     Review of Systems  Constitutional: Negative for fever and chills.  HENT: Negative for drooling.   Respiratory: Positive for cough. Negative for shortness of breath and wheezing.   All other systems reviewed and are negative.     Allergies  Anesthetics, ester  Home Medications   Prior to Admission medications   Medication Sig Start Date End Date Taking? Authorizing Provider  ACCU-CHEK SOFTCLIX LANCETS lancets ACHS for E11.9 03/25/15   Lance Bosch, NP  acetaminophen (TYLENOL) 325 MG tablet Take 650 mg by mouth every 6 (six) hours as needed for headache.    Historical Provider, MD  amLODipine (NORVASC) 10 MG tablet Take 1 tablet (10 mg total) by mouth daily. 12/27/14   Lance Bosch, NP  aspirin 325 MG tablet Take 1 tablet (325 mg total) by mouth daily. 11/06/13   Samella Parr, NP  atorvastatin (LIPITOR) 40 MG tablet TAKE 1 TABLET BY MOUTH DAILY AT 6 PM. 12/26/14   Lance Bosch, NP  Blood Glucose Monitoring Suppl (ACCU-CHEK AVIVA PLUS) W/DEVICE KIT ACHS for E11.9 03/25/15  Lance Bosch, NP  buPROPion [facility] SR) 150 MG 12 hr tablet TAKE 2 TABLETS IN THE MORNING AND 1 TABLET AT NIGHT 12/26/14   Lance Bosch, NP  glucose blood (ACCU-CHEK AVIVA) test strip Check blood sugar ACHS for E11.9 03/25/15   Lance Bosch, NP  insulin glargine (LANTUS) 100 UNIT/ML injection Inject 0.28 mLs (28 Units total) into the skin at bedtime. 10/18/14   Lance Bosch, NP  levothyroxine (SYNTHROID, LEVOTHROID) 100 MCG tablet Take 1 tablet (100 mcg total) by mouth daily. 12/28/14   Lance Bosch, NP  Liraglutide 18 MG/3ML SOPN Inject 0.1 mLs (0.6 mg total) into the skin daily. 01/20/15   Lance Bosch, NP  lisinopril-hydrochlorothiazide (PRINZIDE,ZESTORETIC) 20-25 MG per tablet Take 1 tablet by mouth daily. 09/02/14   Lance Bosch, NP  loratadine (CLARITIN) 10 MG tablet Take 1 tablet (10 mg  total) by mouth daily. 02/21/15   Tresa Garter, MD  nicotine (NICODERM CQ - DOSED IN MG/24 HOURS) 14 mg/24hr patch Place 1 patch (14 mg total) onto the skin daily. 11/06/13   Samella Parr, NP  omeprazole (PRILOSEC) 40 MG capsule TAKE 1 CAPSULE BY MOUTH ONCE DAILY. 09/13/14   Lance Bosch, NP  terbinafine (LAMISIL) 250 MG tablet Take 1 tablet (250 mg total) by mouth daily. 01/28/15   Bronson Ing, DPM  traMADol (ULTRAM) 50 MG tablet Take 1 tablet (50 mg total) by mouth 2 (two) times daily. 02/21/15   Tresa Garter, MD   BP 152/53 mmHg  Pulse 94  Temp(Src) 98 F (36.7 C) (Oral)  Resp 18  Ht _0  (1.575 m)  Wt 130 lb (58.968 kg)  BMI 23.77 kg/m2  SpO2 96% Physical Exam  Constitutional: She is oriented to person, place, and time. She appears well-developed and well-nourished. No distress.  HENT:  Head: Normocephalic and atraumatic.  Right Ear: Tympanic membrane normal.  Left Ear: Tympanic membrane normal.  Tracheas midline, no stridor  Clear post-nasal drip  Eyes: Conjunctivae and EOM are normal. Pupils are equal, round, and reactive to light.  Neck: Normal range of motion. Neck supple. No tracheal deviation present. No thyromegaly present.  Cardiovascular: Normal rate.   Pulmonary/Chest: Effort normal. No respiratory distress. She has no wheezes. She has no rales.  Abdominal: Soft. Bowel sounds are normal. There is no tenderness. There is no rebound and no guarding.  Musculoskeletal: Normal range of motion.  Lower DTR's intact  Lymphadenopathy:    She has no cervical adenopathy.  Neurological: She is alert and oriented to person, place, and time.  Skin: Skin is warm and dry.  Psychiatric: She has a normal mood and affect. Her behavior is normal.  Nursing note and vitals reviewed.   ED Course  Procedures (including critical care time) DIAGNOSTIC STUDIES: Oxygen Saturation is 96% on RA, normal by my interpretation.    COORDINATION OF CARE: 11:59 PM- Pt advised  of plan for treatment and pt agrees.    Labs Review Labs Reviewed - No data to display  Imaging Review No results found. I have personally reviewed and evaluated these images and lab results as part of my medical decision-making.   EKG Interpretation None      MDM   Final diagnoses:  None    Likely allergic phenomenon will treat with flonase and clarinex and have patient follow up with PMD for ongoing care  I personally performed the services described in this documentation, which was scribed in my presence.  The recorded information has been reviewed and is accurate.    Veatrice Kells, MD 04/16/15 [phone]

## 2015-04-15 NOTE — ED Notes (Signed)
Pt states she has had a cough for 2 weeks now, not getting any better

## 2015-04-15 NOTE — ED Notes (Signed)
Patient transported to X-ray 

## 2015-04-15 NOTE — ED Notes (Signed)
Pt returned from xray

## 2015-04-16 ENCOUNTER — Encounter (HOSPITAL_BASED_OUTPATIENT_CLINIC_OR_DEPARTMENT_OTHER): Payer: Self-pay | Admitting: Emergency Medicine

## 2015-04-16 DIAGNOSIS — J069 Acute upper respiratory infection, unspecified: Secondary | ICD-10-CM | POA: Diagnosis not present

## 2015-04-16 MED ORDER — FLUTICASONE PROPIONATE 50 MCG/ACT NA SUSP: 2.0000 | Freq: Every day | NASAL | Status: DC

## 2015-04-16 MED ORDER — GUAIFENESIN DM 400-20 MG PO TABS: 1.0000 | ORAL_TABLET | Freq: Two times a day (BID) | ORAL | Status: DC

## 2015-04-16 MED ORDER — GI COCKTAIL ~~LOC~~
30.0000 mL | Freq: Once | ORAL | Status: AC
  Administered 2015-04-16: 30 mL via ORAL
  Filled 2015-04-16: qty 30

## 2015-04-16 MED ORDER — DESLORATADINE 5 MG PO TABS: 5.0000 mg | ORAL_TABLET | Freq: Every day | ORAL | Status: DC

## 2015-04-16 NOTE — ED Notes (Signed)
Pt verbalizes understanding of d/c instructions and denies any further need at this time. 

## 2015-04-16 NOTE — Discharge Instructions (Signed)
Cool Mist Vaporizers °Vaporizers may help relieve the symptoms of a cough and cold. They add moisture to the air, which helps mucus to become thinner and less sticky. This makes it easier to breathe and cough up secretions. Cool mist vaporizers do not cause serious burns like hot mist vaporizers, which may also be called steamers or humidifiers. Vaporizers have not been proven to help with colds. You should not use a vaporizer if you are allergic to mold. °HOME CARE INSTRUCTIONS °· Follow the package instructions for the vaporizer. °· Do not use anything other than distilled water in the vaporizer. °· Do not run the vaporizer all of the time. This can cause mold or bacteria to grow in the vaporizer. °· Clean the vaporizer after each time it is used. °· Clean and dry the vaporizer well before storing it. °· Stop using the vaporizer if worsening respiratory symptoms develop. °Document Released: 04/29/2004 Document Revised: 08/07/2013 Document Reviewed: 12/20/2012 °ExitCare® Patient Information ©2015 [employer]. This information is not intended to replace advice given to you by your health care provider. Make sure you discuss any questions you have with your health care provider. ° °

## 2015-04-16 NOTE — ED Notes (Signed)
Pt called to state she does not have any medications for her cough.  Reviewed discharge meds and directions with the pt.  Pt states she did not get the dextromethorphan / guaifenesin filled because it was over the counter.  Education provided on the use of this medication and the expected results.  Pt stated she will go and get it filled.

## 2015-04-23 ENCOUNTER — Other Ambulatory Visit: Payer: Self-pay

## 2015-04-23 DIAGNOSIS — E1141 Type 2 diabetes mellitus with diabetic mononeuropathy: Secondary | ICD-10-CM

## 2015-04-23 MED ORDER — LIRAGLUTIDE 18 MG/3ML ~~LOC~~ SOPN: 0.6000 mg | PEN_INJECTOR | Freq: Every day | SUBCUTANEOUS | Status: DC

## 2015-05-05 ENCOUNTER — Other Ambulatory Visit: Payer: Self-pay | Admitting: Internal Medicine

## 2015-05-05 ENCOUNTER — Other Ambulatory Visit: Payer: Self-pay | Admitting: Podiatrist

## 2015-05-05 ENCOUNTER — Ambulatory Visit: Payer: Medicare Other | Admitting: Internal Medicine

## 2015-05-05 NOTE — Telephone Encounter (Signed)
Reply to [place] pt had taken 150 doses of Lamisil and it would take 6-9 months to notice in out-growth of the fungus.  Left message [phone] informing pt our office had prescribed the maximum amount of the medication she had requested, that if she had any question or concerns or wished to change therapy she needed to make an appt.

## 2015-05-13 ENCOUNTER — Encounter: Payer: Self-pay | Admitting: Internal Medicine

## 2015-05-13 ENCOUNTER — Ambulatory Visit: Payer: Medicare Other | Attending: Internal Medicine | Admitting: Internal Medicine

## 2015-05-13 VITALS — BP 136/70 | HR 98 | Temp 97.8°F | Resp 16 | Wt 134.2 lb

## 2015-05-13 DIAGNOSIS — I6523 Occlusion and stenosis of bilateral carotid arteries: Secondary | ICD-10-CM

## 2015-05-13 DIAGNOSIS — Z23 Encounter for immunization: Secondary | ICD-10-CM | POA: Diagnosis not present

## 2015-05-13 DIAGNOSIS — Z794 Long term (current) use of insulin: Secondary | ICD-10-CM | POA: Insufficient documentation

## 2015-05-13 DIAGNOSIS — Z79899 Other long term (current) drug therapy: Secondary | ICD-10-CM | POA: Insufficient documentation

## 2015-05-13 DIAGNOSIS — J069 Acute upper respiratory infection, unspecified: Secondary | ICD-10-CM | POA: Diagnosis not present

## 2015-05-13 DIAGNOSIS — I10 Essential (primary) hypertension: Secondary | ICD-10-CM | POA: Insufficient documentation

## 2015-05-13 DIAGNOSIS — E039 Hypothyroidism, unspecified: Secondary | ICD-10-CM | POA: Diagnosis not present

## 2015-05-13 DIAGNOSIS — E119 Type 2 diabetes mellitus without complications: Secondary | ICD-10-CM | POA: Insufficient documentation

## 2015-05-13 DIAGNOSIS — E785 Hyperlipidemia, unspecified: Secondary | ICD-10-CM | POA: Insufficient documentation

## 2015-05-13 DIAGNOSIS — Z87891 Personal history of nicotine dependence: Secondary | ICD-10-CM | POA: Insufficient documentation

## 2015-05-13 LAB — GLUCOSE, POCT (MANUAL RESULT ENTRY): POC Glucose: 115 mg/dl — AB (ref 70–99)

## 2015-05-13 LAB — POCT GLYCOSYLATED HEMOGLOBIN (HGB A1C): Hemoglobin A1C: 8.4

## 2015-05-13 MED ORDER — LIRAGLUTIDE 18 MG/3ML ~~LOC~~ SOPN: 1.2000 mg | PEN_INJECTOR | Freq: Every day | SUBCUTANEOUS | Status: DC

## 2015-05-13 MED ORDER — ATORVASTATIN CALCIUM 40 MG PO TABS: ORAL_TABLET | ORAL | Status: DC

## 2015-05-13 MED ORDER — AZITHROMYCIN 250 MG PO TABS: ORAL_TABLET | ORAL | Status: DC

## 2015-05-13 MED ORDER — INSULIN GLARGINE 100 UNIT/ML ~~LOC~~ SOLN: 38.0000 [IU] | Freq: Every day | SUBCUTANEOUS | Status: DC

## 2015-05-13 MED ORDER — LISINOPRIL-HYDROCHLOROTHIAZIDE 20-25 MG PO TABS: 1.0000 | ORAL_TABLET | Freq: Every day | ORAL | Status: DC

## 2015-05-13 MED ORDER — AMLODIPINE BESYLATE 10 MG PO TABS: 10.0000 mg | ORAL_TABLET | Freq: Every day | ORAL | Status: DC

## 2015-05-13 NOTE — Progress Notes (Signed)
Patient here for a cough that she has had for several weeks Patient was seen in the ED the end of august and chest x ray was clear But still having a cough

## 2015-05-13 NOTE — Progress Notes (Signed)
Subjective:    Patient ID: Sonia Skinner, female    DOB: 06-27-1951, 64 y.o.   MRN: [id number]  HPI Comments: Was treated by Urgent care on 8/30 and was given Flonase and Clarinex. She states that she has continued to have the same cough.  Patient has been compliant with diabetic diet and medication regimen. She notes that her cbg readings have been ranging from 120-180. She denies blurred vision, headache, polyuria/dipsia, or neuropathy. She checks her feet daily and denies any ulcerative changes. She admits to increasing her Victoza to 1.8 mg herself for the past 1 month and has noticed better cbg readings since.   Cough The current episode started 1 to 4 weeks ago. The problem has been unchanged. The problem occurs constantly. The cough is productive of sputum (yellow). Associated symptoms include headaches, postnasal drip, rhinorrhea and sweats. Pertinent negatives include no fever, sore throat or shortness of breath. The symptoms are aggravated by lying down. She has tried rest for the symptoms. There is no history of asthma. former smoker      Review of Systems  Constitutional: Negative for fever.  HENT: Positive for postnasal drip and rhinorrhea. Negative for sore throat.   Respiratory: Positive for cough. Negative for shortness of breath.   Neurological: Positive for headaches.  All other systems reviewed and are negative.      Objective:   Physical Exam  Constitutional: She is oriented to person, place, and time.  HENT:  Right Ear: External ear normal.  Left Ear: External ear normal.  Nose: Nose normal.  Mouth/Throat: Oropharynx is clear and moist. No oropharyngeal exudate.  Eyes: Right eye exhibits no discharge. Left eye exhibits no discharge.  Neck: No JVD present.  Cardiovascular: Normal rate, regular rhythm and normal heart sounds.   Pulmonary/Chest: Effort normal and breath sounds normal. She has no wheezes.  Neurological: She is alert and oriented to person, place,  and time.  Skin: Skin is warm and dry.  Psychiatric: She has a normal mood and affect.      Assessment & Plan:  Darci was seen today for cough.  Diagnoses and all orders for this visit:  Type 2 diabetes mellitus without complication -     Glucose (CBG) -     HgB A1c -     Basic Metabolic Panel -     Liraglutide 18 MG/3ML SOPN; Inject 0.2 mLs (1.2 mg total) into the skin daily. -     insulin glargine (LANTUS) 100 UNIT/ML injection; Inject 0.38 mLs (38 Units total) into the skin at bedtime. Hemoglobin a1c has improved from 9.9 to 8.4%. I have congratulated patient on improvement and encouraged her to continue to make diet changes. I will change patients Victoza dose to 1.2 mg daily and increase her Lantus by 3 units per night. She has been given instructions to increase her Lantus another 1 unit if her sugars are above 150 but to not go above 45 units of insulin. She will call clinic with changes as needed.   Essential hypertension -     amLODipine (NORVASC) 10 MG tablet; Take 1 tablet (10 mg total) by mouth daily. -     lisinopril-hydrochlorothiazide (PRINZIDE,ZESTORETIC) 20-25 MG tablet; Take 1 tablet by mouth daily. Patient blood pressure is stable and may continue on current medication.  Education on diet, exercise, and modifiable risk factors discussed. Will obtain appropriate labs as needed. Will follow up in 3-6 months.   Hypothyroidism, unspecified hypothyroidism type -  TSH -     T4, Free Will send refills based off results of TSH  HLD (hyperlipidemia) -     atorvastatin (LIPITOR) 40 MG tablet; TAKE 1 TABLET BY MOUTH DAILY AT 6 PM. Education provided on proper lifestyle changes in order to lower cholesterol. Patient advised to maintain healthy weight and to keep total fat intake at 25-35% of total calories and carbohydrates 50-60% of total daily calories. Explained how high cholesterol places patient at risk for heart disease. Patient placed on appropriate medication and repeat  labs in 6 months   Influenza vaccine needed -     Flu Vaccine QUAD 36+ mos PF IM (Fluarix & Fluzone Quad PF)  URI (upper respiratory infection) -     azithromycin (ZITHROMAX) 250 MG tablet; Take 2 pills today and then 1 pill each day after until complete Cough has been present for too long, I will treat with Z-pack. Last chest xray was 2 weeks ago which was normal. I do not feel it is related to ACE inhibitor due to sputum production versus dry cough. She will continue Clarinex as well. If no improvement in 7-10 days may call me back   Return in about 3 months (around 08/12/2015) for DM/HTN.  Lance Bosch, NP 05/14/2015 9:18 AM

## 2015-05-14 ENCOUNTER — Telehealth: Payer: Self-pay | Admitting: Internal Medicine

## 2015-05-14 LAB — TSH: TSH: 0.385 u[IU]/mL (ref 0.350–4.500)

## 2015-05-14 LAB — BASIC METABOLIC PANEL
BUN: 13 mg/dL (ref 7–25)
CO2: 25 mmol/L (ref 20–31)
Calcium: 9.2 mg/dL (ref 8.6–10.4)
Chloride: 104 mmol/L (ref 98–110)
Creat: 0.94 mg/dL (ref 0.50–0.99)
Glucose, Bld: 97 mg/dL (ref 65–99)
Potassium: 4.4 mmol/L (ref 3.5–5.3)
Sodium: 143 mmol/L (ref 135–146)

## 2015-05-14 LAB — T4, FREE: Free T4: 1.48 ng/dL (ref 0.80–1.80)

## 2015-05-14 NOTE — Telephone Encounter (Signed)
Patient came in requesting medication refill for buPROPion [facility] SR) Please follow up

## 2015-05-15 ENCOUNTER — Telehealth: Payer: Self-pay

## 2015-05-15 NOTE — Telephone Encounter (Signed)
Patient not available Left message on voice mail to return our call 

## 2015-05-15 NOTE — Telephone Encounter (Signed)
-----   Message from Lance Bosch, NP sent at 05/14/2015  9:07 AM EDT ----- TSH is back on low end of normal. May refill her current dose of Synthroid. All other labs are normal

## 2015-05-19 ENCOUNTER — Telehealth: Payer: Self-pay

## 2015-05-19 ENCOUNTER — Other Ambulatory Visit: Payer: Self-pay

## 2015-05-19 DIAGNOSIS — F32A Depression, unspecified: Secondary | ICD-10-CM

## 2015-05-19 DIAGNOSIS — F329 Major depressive disorder, single episode, unspecified: Secondary | ICD-10-CM

## 2015-05-19 MED ORDER — BUPROPION HCL ER (SR) 150 MG PO TB12: ORAL_TABLET | ORAL | Status: DC

## 2015-05-19 NOTE — Telephone Encounter (Signed)
Returned patient phone call Called both numbers and left message on voice mail to ' Return our call [facility]

## 2015-05-21 ENCOUNTER — Telehealth: Payer: Self-pay

## 2015-05-21 MED ORDER — OMEPRAZOLE 40 MG PO CPDR: DELAYED_RELEASE_CAPSULE | ORAL | Status: DC

## 2015-05-21 NOTE — Telephone Encounter (Signed)
Returned patient phone call Patient not available Left message on voice mail to return our call Prescriptions for [facility]butrin and [facility]ilosec [facility]

## 2015-05-22 ENCOUNTER — Other Ambulatory Visit: Payer: Self-pay | Admitting: Internal Medicine

## 2015-05-22 DIAGNOSIS — E119 Type 2 diabetes mellitus without complications: Secondary | ICD-10-CM

## 2015-05-22 MED ORDER — INSULIN GLARGINE 100 UNIT/ML ~~LOC~~ SOLN: 38.0000 [IU] | Freq: Every day | SUBCUTANEOUS | Status: DC

## 2015-05-27 ENCOUNTER — Other Ambulatory Visit: Payer: Self-pay | Admitting: Internal Medicine

## 2015-05-28 ENCOUNTER — Telehealth: Payer: Self-pay

## 2015-05-28 ENCOUNTER — Other Ambulatory Visit: Payer: Self-pay | Admitting: Internal Medicine

## 2015-05-28 DIAGNOSIS — E039 Hypothyroidism, unspecified: Secondary | ICD-10-CM

## 2015-05-28 MED ORDER — LEVOTHYROXINE SODIUM 100 MCG PO TABS: 100.0000 ug | ORAL_TABLET | Freq: Every day | ORAL | Status: DC

## 2015-05-28 NOTE — Telephone Encounter (Signed)
Patient called requesting  A refill on her thyroid medications medication [facility]

## 2015-06-04 ENCOUNTER — Telehealth: Payer: Self-pay

## 2015-06-04 DIAGNOSIS — I10 Essential (primary) hypertension: Secondary | ICD-10-CM

## 2015-06-04 MED ORDER — LISINOPRIL-HYDROCHLOROTHIAZIDE 20-25 MG PO TABS: 1.0000 | ORAL_TABLET | Freq: Every day | ORAL | Status: DC

## 2015-06-04 NOTE — Telephone Encounter (Signed)
Patient called requesting a refill on her lisinopril Prescription [facility] to [facility]

## 2015-06-12 ENCOUNTER — Emergency Department (HOSPITAL_COMMUNITY)
Admission: EM | Admit: 2015-06-12 | Discharge: 2015-06-12 | Disposition: A | Discharge status: Home / Self Care | Payer: Medicare Other | Attending: Emergency Medicine | Admitting: Emergency Medicine

## 2015-06-12 ENCOUNTER — Encounter (HOSPITAL_COMMUNITY): Payer: Self-pay

## 2015-06-12 ENCOUNTER — Emergency Department (HOSPITAL_COMMUNITY): Payer: Medicare Other

## 2015-06-12 DIAGNOSIS — R4182 Altered mental status, unspecified: Secondary | ICD-10-CM | POA: Diagnosis present

## 2015-06-12 DIAGNOSIS — Z7982 Long term (current) use of aspirin: Secondary | ICD-10-CM | POA: Diagnosis not present

## 2015-06-12 DIAGNOSIS — F329 Major depressive disorder, single episode, unspecified: Secondary | ICD-10-CM | POA: Insufficient documentation

## 2015-06-12 DIAGNOSIS — R410 Disorientation, unspecified: Secondary | ICD-10-CM

## 2015-06-12 DIAGNOSIS — I10 Essential (primary) hypertension: Secondary | ICD-10-CM | POA: Insufficient documentation

## 2015-06-12 DIAGNOSIS — N3090 Cystitis, unspecified without hematuria: Secondary | ICD-10-CM | POA: Diagnosis not present

## 2015-06-12 DIAGNOSIS — F419 Anxiety disorder, unspecified: Secondary | ICD-10-CM | POA: Diagnosis not present

## 2015-06-12 DIAGNOSIS — E119 Type 2 diabetes mellitus without complications: Secondary | ICD-10-CM | POA: Insufficient documentation

## 2015-06-12 DIAGNOSIS — Z79899 Other long term (current) drug therapy: Secondary | ICD-10-CM | POA: Insufficient documentation

## 2015-06-12 DIAGNOSIS — K219 Gastro-esophageal reflux disease without esophagitis: Secondary | ICD-10-CM | POA: Insufficient documentation

## 2015-06-12 DIAGNOSIS — R079 Chest pain, unspecified: Secondary | ICD-10-CM | POA: Insufficient documentation

## 2015-06-12 DIAGNOSIS — Z87891 Personal history of nicotine dependence: Secondary | ICD-10-CM | POA: Insufficient documentation

## 2015-06-12 DIAGNOSIS — I6789 Other cerebrovascular disease: Secondary | ICD-10-CM | POA: Diagnosis not present

## 2015-06-12 DIAGNOSIS — Z794 Long term (current) use of insulin: Secondary | ICD-10-CM | POA: Diagnosis not present

## 2015-06-12 DIAGNOSIS — Z8673 Personal history of transient ischemic attack (TIA), and cerebral infarction without residual deficits: Secondary | ICD-10-CM | POA: Insufficient documentation

## 2015-06-12 DIAGNOSIS — R29818 Other symptoms and signs involving the nervous system: Secondary | ICD-10-CM | POA: Diagnosis not present

## 2015-06-12 DIAGNOSIS — I639 Cerebral infarction, unspecified: Secondary | ICD-10-CM | POA: Diagnosis not present

## 2015-06-12 DIAGNOSIS — R202 Paresthesia of skin: Secondary | ICD-10-CM | POA: Diagnosis not present

## 2015-06-12 DIAGNOSIS — H538 Other visual disturbances: Secondary | ICD-10-CM | POA: Diagnosis not present

## 2015-06-12 LAB — I-STAT TROPONIN, ED: Troponin i, poc: 0 ng/mL (ref 0.00–0.08)

## 2015-06-12 LAB — COMPREHENSIVE METABOLIC PANEL
ALT: 34 U/L (ref 14–54)
AST: 23 U/L (ref 15–41)
Albumin: 3.9 g/dL (ref 3.5–5.0)
Alkaline Phosphatase: 141 U/L — ABNORMAL HIGH (ref 38–126)
Anion gap: 11 (ref 5–15)
BUN: 12 mg/dL (ref 6–20)
CO2: 25 mmol/L (ref 22–32)
Calcium: 9.3 mg/dL (ref 8.9–10.3)
Chloride: 104 mmol/L (ref 101–111)
Creatinine, Ser: 1.37 mg/dL — ABNORMAL HIGH (ref 0.44–1.00)
GFR calc Af Amer: 46 mL/min — ABNORMAL LOW (ref >60)
GFR calc non Af Amer: 40 mL/min — ABNORMAL LOW (ref >60)
Glucose, Bld: 132 mg/dL — ABNORMAL HIGH (ref 65–99)
Potassium: 3.5 mmol/L (ref 3.5–5.1)
Sodium: 140 mmol/L (ref 135–145)
Total Bilirubin: 0.3 mg/dL (ref 0.3–1.2)
Total Protein: 6.7 g/dL (ref 6.5–8.1)

## 2015-06-12 LAB — URINALYSIS, ROUTINE W REFLEX MICROSCOPIC
Bilirubin Urine: NEGATIVE
Glucose, UA: NEGATIVE mg/dL
Hgb urine dipstick: NEGATIVE
Ketones, ur: NEGATIVE mg/dL
Nitrite: NEGATIVE
Protein, ur: NEGATIVE mg/dL
Specific Gravity, Urine: 1.022 (ref 1.005–1.030)
Urobilinogen, UA: 0.2 mg/dL (ref 0.0–1.0)
pH: 5.5 (ref 5.0–8.0)

## 2015-06-12 LAB — DIFFERENTIAL
Basophils Absolute: 0.1 10*3/uL (ref 0.0–0.1)
Basophils Relative: 1 %
Eosinophils Absolute: 0.3 10*3/uL (ref 0.0–0.7)
Eosinophils Relative: 2 %
Lymphocytes Relative: 41 %
Lymphs Abs: 4.3 10*3/uL — ABNORMAL HIGH (ref 0.7–4.0)
Monocytes Absolute: 0.8 10*3/uL (ref 0.1–1.0)
Monocytes Relative: 8 %
Neutro Abs: 5.2 10*3/uL (ref 1.7–7.7)
Neutrophils Relative %: 48 %

## 2015-06-12 LAB — RAPID URINE DRUG SCREEN, HOSP PERFORMED
Amphetamines: NOT DETECTED
Barbiturates: NOT DETECTED
Benzodiazepines: NOT DETECTED
Cocaine: NOT DETECTED
Opiates: NOT DETECTED
Tetrahydrocannabinol: NOT DETECTED

## 2015-06-12 LAB — PROTIME-INR
INR: 1.1 (ref 0.00–1.49)
Prothrombin Time: 14.4 seconds (ref 11.6–15.2)

## 2015-06-12 LAB — CBC
HCT: 36.7 % (ref 36.0–46.0)
Hemoglobin: 11.5 g/dL — ABNORMAL LOW (ref 12.0–15.0)
MCH: 27.9 pg (ref 26.0–34.0)
MCHC: 31.3 g/dL (ref 30.0–36.0)
MCV: 89.1 fL (ref 78.0–100.0)
Platelets: 226 10*3/uL (ref 150–400)
RBC: 4.12 MIL/uL (ref 3.87–5.11)
RDW: 13.4 % (ref 11.5–15.5)
WBC: 10.7 10*3/uL — ABNORMAL HIGH (ref 4.0–10.5)

## 2015-06-12 LAB — URINE MICROSCOPIC-ADD ON

## 2015-06-12 LAB — APTT: aPTT: 28 seconds (ref 24–37)

## 2015-06-12 LAB — ETHANOL: Alcohol, Ethyl (B): <5 mg/dL (ref <5)

## 2015-06-12 MED ORDER — NITROFURANTOIN MONOHYD MACRO 100 MG PO CAPS: 100.0000 mg | ORAL_CAPSULE | Freq: Two times a day (BID) | ORAL | Status: DC

## 2015-06-12 NOTE — ED Notes (Signed)
Pt requested something to drink ok per RN Chrislyn to have water. Pt given a cup of water

## 2015-06-12 NOTE — ED Notes (Signed)
Pt. Presents with complaint of bilateral tingling to hands and feet as well as some difficulty producing speech/thoughts and blurry vision and memory issues starting around 1230 today. Pt. With hx of CVA 3/15, no residual deficits. Pt. Denies pain. AxO x4.

## 2015-06-12 NOTE — Discharge Instructions (Signed)
Confusion Confusion is the inability to think with your usual speed or clarity. Confusion may come on quickly or slowly over time. How quickly the confusion comes on depends on the cause. Confusion can be due to any number of causes. CAUSES   Concussion, head injury, or head trauma.  Seizures.  Stroke.  Fever.  Brain tumor.  Age related decreased brain function (dementia).  Heightened emotional states like rage or terror.  Mental illness in which the person loses the ability to determine what is real and what is not (hallucinations).  Infections such as a urinary tract infection (UTI).  Toxic effects from alcohol, drugs, or prescription medicines.  Dehydration and an imbalance of salts in the body (electrolytes).  Lack of sleep.  Low blood sugar (diabetes).  Low levels of oxygen from conditions such as chronic lung disorders.  Drug interactions or other medicine side effects.  Nutritional deficiencies, especially niacin, thiamine, vitamin C, or vitamin B.  Sudden drop in body temperature (hypothermia).  Change in routine, such as when traveling or hospitalized. SIGNS AND SYMPTOMS  People often describe their thinking as cloudy or unclear when they are confused. Confusion can also include feeling disoriented. That means you are unaware of where or who you are. You may also not know what the date or time is. If confused, you may also have difficulty paying attention, remembering, and making decisions. Some people also act aggressively when they are confused.  DIAGNOSIS  The medical evaluation of confusion may include:  Blood and urine tests.  X-rays.  Brain and nervous system tests.  Analyzing your brain waves (electroencephalogram or EEG).  Magnetic resonance imaging (MRI) of your head.  Computed tomography (CT) scan of your head.  Mental status tests in which your health care provider may ask many questions. Some of these questions may seem silly or strange,  but they are a very important test to help diagnose and treat confusion. TREATMENT  An admission to the hospital may not be needed, but a person with confusion should not be left alone. Stay with a family member or friend until the confusion clears. Avoid alcohol, pain relievers, or sedative drugs until you have fully recovered. Do not drive until directed by your health care provider. HOME CARE INSTRUCTIONS  What family and friends can do:  To find out if someone is confused, ask the person to state his or her name, age, and the date. If the person is unsure or answers incorrectly, he or she is confused.  Always introduce yourself, no matter how well the person knows you.  Often remind the person of his or her location.  Place a calendar and clock near the confused person.  Help the person with his or her medicines. You may want to use a pill box, an alarm as a reminder, or give the person each dose as prescribed.  Talk about current events and plans for the day.  Try to keep the environment calm, quiet, and peaceful.  Make sure the person keeps follow-up visits with his or her health care provider. PREVENTION  Ways to prevent confusion:  Avoid alcohol.  Eat a balanced diet.  Get enough sleep.  Take medicine only as directed by your health care provider.  Do not become isolated. Spend time with other people and make plans for your days.  Keep careful watch on your blood sugar levels if you are diabetic. SEEK IMMEDIATE MEDICAL CARE IF:   You develop severe headaches, repeated vomiting, seizures, blackouts, or  slurred speech.  There is increasing confusion, weakness, numbness, restlessness, or personality changes.  You develop a loss of balance, have marked dizziness, feel uncoordinated, or fall.  You have delusions, hallucinations, or develop severe anxiety.  Your family members think you need to be rechecked.   This information is not intended to replace advice given  to you by your health care provider. Make sure you discuss any questions you have with your health care provider.   Document Released: 09/09/2004 Document Revised: 08/23/2014 Document Reviewed: 09/07/2013 Elsevier Interactive Patient Education 2016 [employer] Urinary Tract Infection Urinary tract infections (UTIs) can develop anywhere along your urinary tract. Your urinary tract is your body's drainage system for removing wastes and extra water. Your urinary tract includes two kidneys, two ureters, a bladder, and a urethra. Your kidneys are a pair of bean-shaped organs. Each kidney is about the size of your fist. They are located below your ribs, one on each side of your spine. CAUSES Infections are caused by microbes, which are microscopic organisms, including fungi, viruses, and bacteria. These organisms are so small that they can only be seen through a microscope. Bacteria are the microbes that most commonly cause UTIs. SYMPTOMS  Symptoms of UTIs may vary by age and gender of the patient and by the location of the infection. Symptoms in young women typically include a frequent and intense urge to urinate and a painful, burning feeling in the bladder or urethra during urination. Older women and men are more likely to be tired, shaky, and weak and have muscle aches and abdominal pain. A fever may mean the infection is in your kidneys. Other symptoms of a kidney infection include pain in your back or sides below the ribs, nausea, and vomiting. DIAGNOSIS To diagnose a UTI, your caregiver will ask you about your symptoms. Your caregiver will also ask you to provide a urine sample. The urine sample will be tested for bacteria and white blood cells. White blood cells are made by your body to help fight infection. TREATMENT  Typically, UTIs can be treated with medication. Because most UTIs are caused by a bacterial infection, they usually can be treated with the use of antibiotics. The choice of  antibiotic and length of treatment depend on your symptoms and the type of bacteria causing your infection. HOME CARE INSTRUCTIONS  If you were prescribed antibiotics, take them exactly as your caregiver instructs you. Finish the medication even if you feel better after you have only taken some of the medication.  Drink enough water and fluids to keep your urine clear or pale yellow.  Avoid caffeine, tea, and carbonated beverages. They tend to irritate your bladder.  Empty your bladder often. Avoid holding urine for long periods of time.  Empty your bladder before and after sexual intercourse.  After a bowel movement, women should cleanse from front to back. Use each tissue only once. SEEK MEDICAL CARE IF:   You have back pain.  You develop a fever.  Your symptoms do not begin to resolve within 3 days. SEEK IMMEDIATE MEDICAL CARE IF:   You have severe back pain or lower abdominal pain.  You develop chills.  You have nausea or vomiting.  You have continued burning or discomfort with urination. MAKE SURE YOU:   Understand these instructions.  Will watch your condition.  Will get help right away if you are not doing well or get worse.   This information is not intended to replace advice given to you by  your health care provider. Make sure you discuss any questions you have with your health care provider.   Document Released: 05/12/2005 Document Revised: 04/23/2015 Document Reviewed: 09/10/2011 Elsevier Interactive Patient Education [employer].

## 2015-06-12 NOTE — ED Provider Notes (Signed)
CSN: [id number]     Arrival date & time 06/12/15  1343 History   First MD Initiated Contact with Patient 06/12/15 1350     Chief Complaint  Patient presents with  . Stroke Symptoms     (Consider location/radiation/quality/duration/timing/severity/associated sxs/prior Treatment) Patient is a 64 y.o. female presenting with altered mental status.  Altered Mental Status Presenting symptoms comment:  Difficulty expressing self, funny feeling in all extremities Severity:  Severe Duration:  1 hour Timing:  Constant Progression:  Unchanged Chronicity:  Recurrent Context comment:  Ho L MCA infarct with no deficit Associated symptoms: no abdominal pain, no fever, no nausea and no vomiting     Past Medical History  Diagnosis Date  . Hypertension   . Thyroid disease   . Complication of anesthesia     ALLERY TO ESTER BASE  . Diabetes mellitus     INSULIN DEPENDENT  . GERD (gastroesophageal reflux disease)   . Carotid artery occlusion   . Stroke [facility]) March 2015     left MCA infarct  . Depression   . Anxiety   . Headache    Past Surgical History  Procedure Laterality Date  . Abdominal hysterectomy    . Endarterectomy Left 11/04/2013  . Carotid endarterectomy Left 11-04-13    cea   Family History  Problem Relation Age of Onset  . Diabetes Mother   . Heart disease Mother     Before age 29  . Cancer Father     Lung  . Hypertension Sister   . Diabetes Sister   . Diabetes Sister   . Diabetes Sister    Social History  Substance Use Topics  . Smoking status: Former Smoker -- 0.20 packs/day for 20 years    Types: Cigarettes    Quit date: 11/01/2013  . Smokeless tobacco: Never Used  . Alcohol Use: No   OB History    No data available     Review of Systems  Constitutional: Negative for fever.  Gastrointestinal: Negative for nausea, vomiting and abdominal pain.  All other systems reviewed and are negative.     Allergies  Anesthetics, ester  Home Medications    Prior to Admission medications   Medication Sig Start Date End Date Taking? Authorizing Provider  acetaminophen (TYLENOL) 325 MG tablet Take 650 mg by mouth every 6 (six) hours as needed for headache.   Yes Historical Provider, MD  amLODipine (NORVASC) 10 MG tablet Take 1 tablet (10 mg total) by mouth daily. 05/13/15  Yes Lance Bosch, NP  aspirin 325 MG tablet Take 1 tablet (325 mg total) by mouth daily. 11/06/13  Yes Samella Parr, NP  atorvastatin (LIPITOR) 40 MG tablet TAKE 1 TABLET BY MOUTH DAILY AT 6 PM. 05/13/15  Yes Lance Bosch, NP  buPROPion (WELLBUTRIN SR) 150 MG 12 hr tablet TAKE 2 TABLETS IN THE MORNING AND 1 TABLET AT NIGHT 05/19/15  Yes Lance Bosch, NP  insulin glargine (LANTUS) 100 UNIT/ML injection Inject 0.38 mLs (38 Units total) into the skin at bedtime. 05/22/15  Yes Lance Bosch, NP  levothyroxine (SYNTHROID, LEVOTHROID) 100 MCG tablet Take 1 tablet (100 mcg total) by mouth daily. 05/28/15  Yes Lance Bosch, NP  Liraglutide 18 MG/3ML SOPN Inject 0.2 mLs (1.2 mg total) into the skin daily. 05/13/15  Yes Lance Bosch, NP  lisinopril-hydrochlorothiazide (PRINZIDE,ZESTORETIC) 20-25 MG tablet Take 1 tablet by mouth daily. 06/04/15  Yes Lance Bosch, NP  omeprazole (PRILOSEC) 40 MG capsule TAKE  1 CAPSULE BY MOUTH ONCE DAILY. 05/21/15  Yes Lance Bosch, NP  ACCU-CHEK SOFTCLIX LANCETS lancets ACHS for E11.9 03/25/15   Lance Bosch, NP  amLODipine (NORVASC) 5 MG tablet TAKE 1 TABLET BY MOUTH DAILY Patient not taking: Reported on 06/12/2015 05/22/15   Lance Bosch, NP  azithromycin (ZITHROMAX) 250 MG tablet Take 2 pills today and then 1 pill each day after until complete Patient not taking: Reported on 06/12/2015 05/13/15   Lance Bosch, NP  Blood Glucose Monitoring Suppl (ACCU-CHEK AVIVA PLUS) W/DEVICE KIT ACHS for E11.9 03/25/15   Lance Bosch, NP  desloratadine (CLARINEX) 5 MG tablet Take 1 tablet (5 mg total) by mouth daily. Patient not taking: Reported on  06/12/2015 04/16/15   April Palumbo, MD  Dextromethorphan-Guaifenesin (GUAIFENESIN DM) 400-20 MG TABS Take 1 tablet by mouth 2 (two) times daily. Patient not taking: Reported on 06/12/2015 04/16/15   April Palumbo, MD  fluticasone [facility]) 50 MCG/ACT nasal spray Place 2 sprays into both nostrils daily. Patient taking differently: Place 2 sprays into both nostrils daily as needed for allergies.  04/16/15   April Palumbo, MD  glucose blood (ACCU-CHEK AVIVA) test strip Check blood sugar ACHS for E11.9 03/25/15   Lance Bosch, NP  loratadine (CLARITIN) 10 MG tablet Take 1 tablet (10 mg total) by mouth daily. Patient not taking: Reported on 06/12/2015 02/21/15   Tresa Garter, MD  nicotine (NICODERM CQ - DOSED IN MG/24 HOURS) 14 mg/24hr patch Place 1 patch (14 mg total) onto the skin daily. Patient not taking: Reported on 06/12/2015 11/06/13   Samella Parr, NP  nitrofurantoin, macrocrystal-monohydrate, (MACROBID) 100 MG capsule Take 1 capsule (100 mg total) by mouth 2 (two) times daily. X 7 days 06/12/15   Debby Freiberg, MD  terbinafine (LAMISIL) 250 MG tablet Take 1 tablet (250 mg total) by mouth daily. Patient not taking: Reported on 06/12/2015 01/28/15   Bronson Ing, DPM  traMADol (ULTRAM) 50 MG tablet Take 1 tablet (50 mg total) by mouth 2 (two) times daily. Patient not taking: Reported on 06/12/2015 02/21/15   Tresa Garter, MD   BP 144/58 mmHg  Pulse 95  Temp(Src) 98.2 F (36.8 C) (Oral)  Resp 16  Ht _0  (1.575 m)  Wt 125 lb (56.7 kg)  BMI 22.86 kg/m2  SpO2 99% Physical Exam  Constitutional: She is oriented to person, place, and time. She appears well-developed and well-nourished.  HENT:  Head: Normocephalic and atraumatic.  Right Ear: External ear normal.  Left Ear: External ear normal.  Eyes: Conjunctivae and EOM are normal. Pupils are equal, round, and reactive to light.  Neck: Normal range of motion. Neck supple.  Cardiovascular: Normal rate, regular rhythm,  normal heart sounds and intact distal pulses.   Pulmonary/Chest: Effort normal and breath sounds normal.  Abdominal: Soft. Bowel sounds are normal. There is no tenderness.  Musculoskeletal: Normal range of motion.  Neurological: She is alert and oriented to person, place, and time. She has normal strength and normal reflexes. No cranial nerve deficit or sensory deficit. Coordination normal.  Skin: Skin is warm and dry.  Vitals reviewed.   ED Course  Procedures (including critical care time) Labs Review Labs Reviewed  CBC - Abnormal; Notable for the following:    WBC 10.7 (*)    Hemoglobin 11.5 (*)    All other components within normal limits  DIFFERENTIAL - Abnormal; Notable for the following:    Lymphs Abs 4.3 (*)  All other components within normal limits  COMPREHENSIVE METABOLIC PANEL - Abnormal; Notable for the following:    Glucose, Bld 132 (*)    Creatinine, Ser 1.37 (*)    Alkaline Phosphatase 141 (*)    GFR calc non Af Amer 40 (*)    GFR calc Af Amer 46 (*)    All other components within normal limits  URINALYSIS, ROUTINE W REFLEX MICROSCOPIC (NOT AT [facility]) - Abnormal; Notable for the following:    Leukocytes, UA MODERATE (*)    All other components within normal limits  URINE MICROSCOPIC-ADD ON - Abnormal; Notable for the following:    Squamous Epithelial / LPF FEW (*)    Bacteria, UA FEW (*)    All other components within normal limits  ETHANOL  PROTIME-INR  APTT  URINE RAPID DRUG SCREEN, HOSP PERFORMED  I-STAT TROPOININ, ED    Imaging Review Dg Chest 2 View  06/12/2015  CLINICAL DATA:  Stroke-like symptoms EXAM: CHEST  2 VIEW COMPARISON:  04/15/2015 FINDINGS: Normal heart size and mediastinal contours. No acute infiltrate or edema. No effusion or pneumothorax. Focally advanced disc narrowing in the mid thoracic spine with neighboring endplate sclerosis and endplate indistinctness, questionably progressed from 04/15/2015 but definitely progressed from  11/01/2013. IMPRESSION: 1. No evidence of acute cardiopulmonary disease. 2. Focal mid thoracic disc disease with progression from 10/22/2013 is likely degenerative, but if there is unexplained back pain consider MRI to exclude a chronic discitis. Electronically Signed   By: Monte Fantasia M.D.   On: 06/12/2015 15:38   Ct Head Wo Contrast  06/12/2015  CLINICAL DATA:  Symptoms similar to previous CVA. EXAM: CT HEAD WITHOUT CONTRAST TECHNIQUE: Contiguous axial images were obtained from the base of the skull through the vertex without intravenous contrast. COMPARISON:  11/25/2013 FINDINGS: Skull and Sinuses:Negative for fracture or destructive process. The mastoids, middle ears, and imaged paranasal sinuses are clear. Orbits: No acute abnormality. Brain: No evidence of acute infarction, hemorrhage, hydrocephalus, or mass lesion/mass effect. Small remote cortical infarct at the high left cerebral convexity, seen on MRI from 2015. No progressive ischemic injury detected. Normal cerebral volume. IMPRESSION: No acute finding. Electronically Signed   By: Monte Fantasia M.D.   On: 06/12/2015 15:09   Mr Brain Wo Contrast  06/12/2015  CLINICAL DATA:  65 year old female with acute onset difficult speech, blurred vision, bilateral hand tingling since 1230 hours today. Left MCA infarcts in 2015. Initial encounter. EXAM: MRI HEAD WITHOUT CONTRAST TECHNIQUE: Multiplanar, multiecho pulse sequences of the brain and surrounding structures were obtained without intravenous contrast. COMPARISON:  Head CT without contrast 1452 hours today. Brain MRI 11/29/2013 and earlier. FINDINGS: Major intracranial vascular flow voids are stable since 2015. Small areas of cortical encephalomalacia, primarily in the left sensory strip, related to the left MCA ischemia at that time. No restricted diffusion or evidence of acute infarction. Elsewhere stable gray and white matter signal. No midline shift, mass effect, evidence of mass lesion,  ventriculomegaly, extra-axial collection or acute intracranial hemorrhage. Cervicomedullary junction and pituitary are within normal limits. Visible internal auditory structures appear normal. Mastoids are clear. Negative visualized cervical spine. Normal bone marrow signal. Mild left paranasal sinus mucosal thickening has regressed. Negative orbit and scalp soft tissues. IMPRESSION: 1.  No acute intracranial abnormality. 2. Expected evolution of the 2015 small left MCA infarcts. Electronically Signed   By: Genevie Ann M.D.   On: 06/12/2015 19:17   I have personally reviewed and evaluated these images and lab results as part  of my medical decision-making.   EKG Interpretation   Date/Time:  Thursday June 12 2015 13:55:04 EDT Ventricular Rate:  100 PR Interval:  147 QRS Duration: 84 QT Interval:  361 QTC Calculation: 466 R Axis:   23 Text Interpretation:  Sinus tachycardia SINCE LAST TRACING HEART RATE HAS  INCREASED Confirmed by Debby Freiberg [phone]) on 06/12/2015 2:34:20 PM      MDM   Final diagnoses:  Confusion  Cystitis    64 y.o. female with pertinent PMH of CVA (L MCA), HTN, DM presents with altered mental status described as difficulty expressing herself starting at 1230.  No other deficit.  Exam benign.  Initial wu unremarkable.  Spoke with neurology who recommended MRI.    MRI ordered and pending at time of transfer to Dr. Jeneen Rinks.  I have reviewed all laboratory and imaging studies if ordered as above  1. Confusion   2. Cystitis         Debby Freiberg, MD 06/13/15 [phone]

## 2015-06-19 ENCOUNTER — Telehealth: Payer: Self-pay | Admitting: Internal Medicine

## 2015-06-19 NOTE — Telephone Encounter (Signed)
Pt. Needs refill on Tramadol.Marland KitchenMarland KitchenMarland KitchenMarland Kitchenplease call patient.

## 2015-06-23 ENCOUNTER — Telehealth: Payer: Self-pay

## 2015-06-23 NOTE — Telephone Encounter (Signed)
Patient requesting a refill on her tramadol Can we refill this

## 2015-06-24 DIAGNOSIS — H903 Sensorineural hearing loss, bilateral: Secondary | ICD-10-CM | POA: Diagnosis not present

## 2015-06-24 DIAGNOSIS — H9192 Unspecified hearing loss, left ear: Secondary | ICD-10-CM | POA: Diagnosis not present

## 2015-07-02 ENCOUNTER — Telehealth: Payer: Self-pay

## 2015-07-02 DIAGNOSIS — E785 Hyperlipidemia, unspecified: Secondary | ICD-10-CM

## 2015-07-02 MED ORDER — INSULIN GLARGINE 100 UNIT/ML ~~LOC~~ SOLN: 38.0000 [IU] | Freq: Every day | SUBCUTANEOUS | Status: DC

## 2015-07-02 MED ORDER — ATORVASTATIN CALCIUM 40 MG PO TABS: ORAL_TABLET | ORAL | Status: DC

## 2015-07-02 NOTE — Telephone Encounter (Signed)
Patient called and left message on machine requesting her lantus and lipitor  Be sent to [facility] on [address] Prescriptions sent to requested pharmacy

## 2015-07-03 ENCOUNTER — Telehealth: Payer: Self-pay

## 2015-07-03 NOTE — Telephone Encounter (Signed)
Returned patient phone call Patient made aware her RX for lantus was sent to pharmacy on file yesterday

## 2015-07-08 ENCOUNTER — Other Ambulatory Visit: Payer: Self-pay | Admitting: *Deleted

## 2015-07-08 MED ORDER — TRAMADOL HCL 50 MG PO TABS: 50.0000 mg | ORAL_TABLET | Freq: Two times a day (BID) | ORAL | Status: DC

## 2015-07-08 MED ORDER — LORATADINE 10 MG PO TABS: 10.0000 mg | ORAL_TABLET | Freq: Every day | ORAL | Status: DC

## 2015-07-08 NOTE — Telephone Encounter (Signed)
Patients tramadol and Claritin was refilled for 30 day supply and no additional refills.

## 2015-07-09 ENCOUNTER — Ambulatory Visit (INDEPENDENT_AMBULATORY_CARE_PROVIDER_SITE_OTHER): Payer: Medicare Other | Admitting: Neurology

## 2015-07-09 ENCOUNTER — Encounter: Payer: Self-pay | Admitting: Neurology

## 2015-07-09 VITALS — BP 124/65 | HR 88 | Ht 62.0 in | Wt 129.6 lb

## 2015-07-09 DIAGNOSIS — I6990 Unspecified sequelae of unspecified cerebrovascular disease: Secondary | ICD-10-CM

## 2015-07-09 DIAGNOSIS — I6523 Occlusion and stenosis of bilateral carotid arteries: Secondary | ICD-10-CM | POA: Diagnosis not present

## 2015-07-09 NOTE — Patient Instructions (Signed)
I had a long d/w patient about her remote stroke, risk for recurrent stroke/TIAs, personally independently reviewed imaging studies and stroke evaluation results and answered questions I also discussed her present visit to the emergency room with a recrudescence of her old deficits in the setting of a urinary tract infection and personally reviewed MRI results with the patient.Continue aspirin 325 mg daily  for secondary stroke prevention and maintain strict control of hypertension with blood pressure goal below 130/90, diabetes with hemoglobin A1c goal below 6.5% and lipids with LDL cholesterol goal below 70 mg/dL. I also advised the patient to eat a healthy diet with plenty of whole grains, cereals, fruits and vegetables, exercise regularly and maintain ideal body weight Followup in the future with me in one year or call earlier if necessary.  Thank you for coming to see [place] at [facility]. I hope we have been able to provide you high quality care today. You may receive a patient satisfaction survey over the next few weeks. We would appreciate your feedback and comments so that we may continue to improve ourselves and the health of our patients.

## 2015-07-09 NOTE — Progress Notes (Signed)
PATIENT: Sonia Skinner DOB: Jan 18, 1951  REASON FOR VISIT: routine follow up for stroke HISTORY FROM: patient  HISTORY OF PRESENT ILLNESS: Sonia Skinner is a 64 y.o. female with a history of hypertension and diabetes mellitus who comes in for first office follow up after hospital discharge for stroke.  She presented at [facility] on 11/01/13 with a complaint of intermittent blurring of vision involving left eye for about one week, and new onset weakness involving right upper extremity. CT scan of her head showed no acute intracranial abnormality except for possible abnormal density involving the left frontal lobe superiorly, suggestive of possible subacute stroke. Patient had not been on antiplatelet therapy. Has no previous history of stroke nor TIA. NIH stroke score was 1 for pronation and drift of right upper extremity. Work up at that time revealed an elevated A1c 10.5 and LDL 147. MRI brain showed patchy acute left MCA infarct.  There was critical left ICA stenosis by CTA. Right carotid revealed a plaque at the bifurcation. Vertebrals were patent. MRA showed Moderate to marked tandem stenosis of the internal carotid artery cavernous segment bilaterally. Moderate to marked narrowing proximal left middle cerebral artery branch. Moderate narrowing involving portions of the posterior inferior cerebellar artery bilaterally. Poor delineation left anterior inferior cerebellar artery. Small caliber right superior cerebellar artery. Echocardiogram showed no intracardiac masses or thrombi. She underwent left CEA, with a stenosis of 95%, on 11/04/2013 by Dr. Scot Dock and was discharged from the hospital on 11/06/13 on aspirin.  She awakened at about 11PM 11/24/2013 and was dizzy but was able to eat and went back to bed. When she awakened the next morning morning and started to wash her face she noted that the left side of her face was numb. She went on to church and started to feel even worse. Became dizzy and nauseous and  felt numb on the entire left side. Patient called EMS at that time. MRI was negative for acute infarct. A week later the left facial numbness went back to baseline, the nausea resolved by the next morning.  She still has loss of fine motor skill in left hand. She finished physical therapy, is left hand dominant, still has some right arm loss of dexterity, denies right leg weakness. She states still some pulling feeling at left side of neck, the area is tender but well healed. Has keloid scar at left CEA incision.  Her blood pressure is well controlled, it is 143/60 in office today. She is tolerating her daily aspirin well without significant bruising or bleeding. She is not yet had her cholesterol rechecked since the hospital and starting on Lipitor. She has no known side effects with Lipitor. Her morning blood sugars run between 100-200. She states that she is working on his and trying to change her diet.  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 She hopes that at the start of the school year she can go back as a Optometrist. She plans to work until age 36 to get full retirement.  Update 05/14/14 (LL): Since last visit, she has been well but reports being more tired. She is finding it harder to keep up with her tasks at her job. She feels that she is not as organized as before and could not multi-task easily anymore. She states that she is much quicker to anger now. She states her blood pressure is well controlled, though it  is elevated in the office today at 156/67. She is tolerating her daily aspirin well without significant bruising or bleeding. Last hgb a1c was 9.4, but she is working closely with PCP to get better control of DM; hopes to start Byetta this week. Hopes it will help her lose the 20 lbs. she has gained recently.  She is tolerating aspirin well with no signs of significant bleeding or bruising. Update 12/24/2014 :  She returns for follow-up after last visit 8 months ago. She complained of cognitive difficulties post stroke and Dr. Jannifer Franklin referred her to Dr. Willette Alma and for neuro psych testing which was done on 11/19/14 which shows significant underlying anxiety depression as well as post stroke mild cognitive impairment. Patient has been on Wellbutrin but for smoking cessation but it is not helping her depression. She also complains of mild tremors in hands. She's not had any recurrent stroke or TIA symptoms. She remains on aspirin which is tolerating well without bleeding or bruising. Her blood pressure is well controlled and it is 130/74 today. She has a primary care physician visit later today and transfer of lipid profile checked. She did have follow-up carotid ultrasound done in March this year and Dr. Mee Hives office and it was fine. Update 07/09/2015 ; she returns for follow-up after last visit 6 months ago. She had transient episode of confusion, disorientation and speech and word finding difficulties and was seen in the emergency room on 06/12/15. MRI scan of the brain and CT scan both did not show any acute abnormality. She was found to be intact infection and was treated with antibiotics and gradually improved within a week. She states she's quit smoking completely. She states her blood pressure is well controlled today it is 124/6 fine office. She states her sugars are doing better though last hemoglobin A1c on 05/13/15 was 8.4. She is tolerating aspirin as well as all the medications without any side effects. She has not yet seen a psychiatrist to help manage her depression. She remains on Wellbutrin and is stable dose. She continues to have mild memory difficulties which are unchanged and not progressive. REVIEW OF SYSTEMS: Full 14 system review of systems performed and notable only for: Fatigue, hearing loss, ringing in the ears, memory loss, dizziness, headache, agitation, depression, frequent waking all other  systems negative    ALLERGIES: Allergies  Allergen Reactions  . Anesthetics, Ester Anaphylaxis    HOME MEDICATIONS: Outpatient Prescriptions Prior to Visit  Medication Sig Dispense Refill  . ACCU-CHEK SOFTCLIX LANCETS lancets ACHS for E11.9 100 each 12  . acetaminophen (TYLENOL) 325 MG tablet Take 650 mg by mouth every 6 (six) hours as needed for headache.    Marland Kitchen amLODipine (NORVASC) 5 MG tablet TAKE 1 TABLET BY MOUTH DAILY 30 tablet 3  . aspirin 325 MG tablet Take 1 tablet (325 mg total) by mouth daily.    Marland Kitchen atorvastatin (LIPITOR) 40 MG tablet TAKE 1 TABLET BY MOUTH DAILY AT 6 PM. 30 tablet 4  . Blood Glucose Monitoring Suppl (ACCU-CHEK AVIVA PLUS) W/DEVICE KIT ACHS for E11.9 1 kit 0  . buPROPion (WELLBUTRIN SR) 150 MG 12 hr tablet TAKE 2 TABLETS IN THE MORNING AND 1 TABLET AT NIGHT 90 tablet 3  . desloratadine (CLARINEX) 5 MG tablet Take 1 tablet (5 mg total) by mouth daily. 7 tablet 0  . Dextromethorphan-Guaifenesin (GUAIFENESIN DM) 400-20 MG TABS Take 1 tablet by mouth 2 (two) times daily. 20 tablet 0  . fluticasone (FLONASE) 50 MCG/ACT nasal spray  Place 2 sprays into both nostrils daily. (Patient taking differently: Place 2 sprays into both nostrils daily as needed for allergies. ) 16 g 0  . glucose blood (ACCU-CHEK AVIVA) test strip Check blood sugar ACHS for E11.9 100 each 12  . insulin glargine (LANTUS) 100 UNIT/ML injection Inject 0.38 mLs (38 Units total) into the skin at bedtime. 10 mL 5  . levothyroxine (SYNTHROID, LEVOTHROID) 100 MCG tablet Take 1 tablet (100 mcg total) by mouth daily. 30 tablet 4  . Liraglutide 18 MG/3ML SOPN Inject 0.2 mLs (1.2 mg total) into the skin daily. 6 mL 5  . lisinopril-hydrochlorothiazide (PRINZIDE,ZESTORETIC) 20-25 MG tablet Take 1 tablet by mouth daily. 30 tablet 4  . loratadine (CLARITIN) 10 MG tablet Take 1 tablet (10 mg total) by mouth daily. 30 tablet 0  . nitrofurantoin, macrocrystal-monohydrate, (MACROBID) 100 MG capsule Take 1 capsule (100  mg total) by mouth 2 (two) times daily. X 7 days 14 capsule 0  . omeprazole (PRILOSEC) 40 MG capsule TAKE 1 CAPSULE BY MOUTH ONCE DAILY. 30 capsule 3  . traMADol (ULTRAM) 50 MG tablet Take 1 tablet (50 mg total) by mouth 2 (two) times daily. 60 tablet 0  . amLODipine (NORVASC) 10 MG tablet Take 1 tablet (10 mg total) by mouth daily. (Patient not taking: Reported on 07/09/2015) 30 tablet 2  . azithromycin (ZITHROMAX) 250 MG tablet Take 2 pills today and then 1 pill each day after until complete (Patient not taking: Reported on 07/09/2015) 6 each 0  . nicotine (NICODERM CQ - DOSED IN MG/24 HOURS) 14 mg/24hr patch Place 1 patch (14 mg total) onto the skin daily. (Patient not taking: Reported on 07/09/2015) 28 patch 0  . terbinafine (LAMISIL) 250 MG tablet Take 1 tablet (250 mg total) by mouth daily. (Patient not taking: Reported on 07/09/2015) 60 tablet 0   No facility-administered medications prior to visit.    PHYSICAL EXAM Filed Vitals:   07/09/15 1551  BP: 124/65  Pulse: 88  Height: _0  (1.575 m)  Weight: 129 lb 9.6 oz (58.786 kg)   Body mass index is 23.7 kg/(m^2).  Generalized: Frail middle-aged African-American lady, in no acute distress, pleasant AA female Head: normocephalic and atraumatic.   Neck: Supple, no carotid bruits, left CEA scar with mild keloid Cardiac: Regular rate rhythm, no murmur  Musculoskeletal: No deformity   Neurological examination  Mentation: Alert oriented to time, place, history taking. Follows all commands speech and language fluent. Recall 3/3. Animal naming 9. Intact calculation. Difficulty with multitasking Cranial nerve II-XII: Fundoscopic exam not done. Pupils were equal round reactive to light extraocular movements were full, visual field were full on confrontational test. Facial sensation mildly decreased on the right but strength is normal bilaterally. Hearing was intact to finger rubbing bilaterally. Uvula tongue midline. head turning and shoulder  shrug and were normal and symmetric.Tongue protrusion into cheek strength was normal. Motor: The motor testing reveals 5 over 5 strength of all 4 extremities. Good symmetric motor tone is noted throughout. Mildly decreased fine motor skills in left hand. Sensory: Sensory testing is intact to soft touch on all 4 extremities. No evidence of extinction is noted.  Coordination: Cerebellar testing reveals good finger-nose-finger and heel-to-shin bilaterally.  Gait and station: Gait is normal. Tandem gait is normal. Romberg is negative. Reflexes: Deep tendon reflexes are symmetric and normal bilaterally.      ASSESSMENT: 64 y.o. year old female  has a past medical history of Hypertension; Thyroid disease; Complication of anesthesia; Diabetes  mellitus; GERD (gastroesophageal reflux disease); Carotid artery occlusion; and left MCA stroke on 11/01/2013. Status post left CEA by Dr. Doren Custard. Left sided weakness occurred approximately 3 weeks later but no acute infarct was seen on MRI. Vascular risk factors of diabetes, former smoker, hypertension, and hyperlipidemia. Mild cognitive difficulties post stroke likely mild cognitive impairment with some underlying  Suboptimally treated depression. Recent transient recrudescence of old stroke deficits in October 2016 in the setting of urinary tract infection  PLAN: I had a long d/w patient about her remote stroke, risk for recurrent stroke/TIAs, personally independently reviewed imaging studies and stroke evaluation results and answered questions I also discussed her present visit to the emergency room with a recrudescence of her old deficits in the setting of a urinary tract infection and personally reviewed MRI results with the patient.Continue aspirin 325 mg daily  for secondary stroke prevention and maintain strict control of hypertension with blood pressure goal below 130/90, diabetes with hemoglobin A1c goal below 6.5% and lipids with LDL cholesterol goal below 70  mg/dL. I also advised the patient to eat a healthy diet with plenty of whole grains, cereals, fruits and vegetables, exercise regularly and maintain ideal body weight I advised her to see her primary physician to get referral to a psychiatrist for more optimal treatment of depression. Greater than 50% time during this 25 minute visit was spent on counseling and coordination of care. Followup in the future with me in one year or call earlier if necessary.  Sonia Contras, MD  07/09/2015, 4:29 PM [facility] [address] [place], [place] [address] [phone]  Note: This document was prepared with digital dictation and possible smart phrase technology. Any transcriptional errors that result from this process are unintentional.

## 2015-08-01 ENCOUNTER — Ambulatory Visit: Payer: Medicare Other | Admitting: Internal Medicine

## 2015-08-06 ENCOUNTER — Other Ambulatory Visit: Payer: Self-pay | Admitting: Internal Medicine

## 2015-08-06 ENCOUNTER — Telehealth: Payer: Self-pay

## 2015-08-06 ENCOUNTER — Encounter: Payer: Self-pay | Admitting: Internal Medicine

## 2015-08-06 DIAGNOSIS — E039 Hypothyroidism, unspecified: Secondary | ICD-10-CM

## 2015-08-06 MED ORDER — LEVOTHYROXINE SODIUM 100 MCG PO TABS: 100.0000 ug | ORAL_TABLET | Freq: Every day | ORAL | Status: DC

## 2015-08-06 NOTE — Telephone Encounter (Signed)
error 

## 2015-08-06 NOTE — Telephone Encounter (Signed)
Placed a call out to patient in reference to her tramadol refill request Patient states she used it for headaches and has used this in the past

## 2015-08-06 NOTE — Telephone Encounter (Signed)
May give 20 pills to take BID PRN, no refills

## 2015-08-07 MED ORDER — TRAMADOL HCL 50 MG PO TABS: 50.0000 mg | ORAL_TABLET | Freq: Two times a day (BID) | ORAL | Status: DC | PRN

## 2015-08-14 ENCOUNTER — Encounter: Payer: Self-pay | Admitting: Internal Medicine

## 2015-08-14 ENCOUNTER — Ambulatory Visit: Payer: Medicare Other | Attending: Internal Medicine | Admitting: Internal Medicine

## 2015-08-14 VITALS — BP 127/69 | HR 93 | Temp 98.5°F | Resp 18 | Ht 62.0 in | Wt 130.0 lb

## 2015-08-14 DIAGNOSIS — I10 Essential (primary) hypertension: Secondary | ICD-10-CM

## 2015-08-14 DIAGNOSIS — E785 Hyperlipidemia, unspecified: Secondary | ICD-10-CM | POA: Diagnosis not present

## 2015-08-14 DIAGNOSIS — Z1231 Encounter for screening mammogram for malignant neoplasm of breast: Secondary | ICD-10-CM | POA: Insufficient documentation

## 2015-08-14 DIAGNOSIS — F329 Major depressive disorder, single episode, unspecified: Secondary | ICD-10-CM | POA: Diagnosis not present

## 2015-08-14 DIAGNOSIS — Z1211 Encounter for screening for malignant neoplasm of colon: Secondary | ICD-10-CM

## 2015-08-14 DIAGNOSIS — E079 Disorder of thyroid, unspecified: Secondary | ICD-10-CM

## 2015-08-14 DIAGNOSIS — Z7982 Long term (current) use of aspirin: Secondary | ICD-10-CM | POA: Insufficient documentation

## 2015-08-14 DIAGNOSIS — Z0000 Encounter for general adult medical examination without abnormal findings: Secondary | ICD-10-CM | POA: Diagnosis not present

## 2015-08-14 DIAGNOSIS — F32A Depression, unspecified: Secondary | ICD-10-CM

## 2015-08-14 DIAGNOSIS — Z1239 Encounter for other screening for malignant neoplasm of breast: Secondary | ICD-10-CM

## 2015-08-14 DIAGNOSIS — Z794 Long term (current) use of insulin: Secondary | ICD-10-CM | POA: Diagnosis not present

## 2015-08-14 DIAGNOSIS — E119 Type 2 diabetes mellitus without complications: Secondary | ICD-10-CM | POA: Diagnosis not present

## 2015-08-14 DIAGNOSIS — E1141 Type 2 diabetes mellitus with diabetic mononeuropathy: Secondary | ICD-10-CM | POA: Diagnosis not present

## 2015-08-14 DIAGNOSIS — R51 Headache: Secondary | ICD-10-CM | POA: Insufficient documentation

## 2015-08-14 DIAGNOSIS — I6523 Occlusion and stenosis of bilateral carotid arteries: Secondary | ICD-10-CM

## 2015-08-14 LAB — GLUCOSE, POCT (MANUAL RESULT ENTRY): POC Glucose: 143 mg/dl — AB (ref 70–99)

## 2015-08-14 LAB — POCT GLYCOSYLATED HEMOGLOBIN (HGB A1C): Hemoglobin A1C: 7.9

## 2015-08-14 LAB — TSH: TSH: 8.005 u[IU]/mL — ABNORMAL HIGH (ref 0.350–4.500)

## 2015-08-14 MED ORDER — LISINOPRIL-HYDROCHLOROTHIAZIDE 20-25 MG PO TABS: 1.0000 | ORAL_TABLET | Freq: Every day | ORAL | Status: DC

## 2015-08-14 MED ORDER — "INSULIN SYRINGE 28G X 1/2"" 0.5 ML MISC": Status: DC

## 2015-08-14 MED ORDER — GLUCOSE BLOOD VI STRP: ORAL_STRIP | Status: DC

## 2015-08-14 MED ORDER — OMEPRAZOLE 40 MG PO CPDR: DELAYED_RELEASE_CAPSULE | ORAL | Status: DC

## 2015-08-14 MED ORDER — AMLODIPINE BESYLATE 5 MG PO TABS: 5.0000 mg | ORAL_TABLET | Freq: Every day | ORAL | Status: DC

## 2015-08-14 MED ORDER — INSULIN GLARGINE 100 UNIT/ML ~~LOC~~ SOLN: 38.0000 [IU] | Freq: Every day | SUBCUTANEOUS | Status: DC

## 2015-08-14 MED ORDER — ACCU-CHEK SOFTCLIX LANCET DEV MISC: Status: DC

## 2015-08-14 MED ORDER — ATORVASTATIN CALCIUM 40 MG PO TABS: ORAL_TABLET | ORAL | Status: DC

## 2015-08-14 MED ORDER — ACCU-CHEK AVIVA PLUS W/DEVICE KIT: PACK | Status: DC

## 2015-08-14 NOTE — Progress Notes (Signed)
Patient ID: Sonia Skinner, female   DOB: [birth year], 64 y.o.   MRN: [id number] SUBJECTIVE: 64 y.o. female for follow up of diabetes. Diabetic Review of Systems - medication compliance: compliant most of the time, diabetic diet compliance: compliant most of the time, home glucose monitoring: is performed regularly, further diabetic ROS: no polyuria or polydipsia, no chest pain, dyspnea or TIA's, no unusual visual symptoms, no hypoglycemia, has dysesthesias in the feet.  Patient reports that she is taking Wellbutrin but it is not helping with her depression. Patient reports that she never received psychiatry referral. She has not had a mammogram in 6 years and needs a referral. Was seen by Neurologist and was told that her headaches may be related to her depression.   Current Outpatient Prescriptions  Medication Sig Dispense Refill  . ACCU-CHEK SOFTCLIX LANCETS lancets ACHS for E11.9 100 each 12  . acetaminophen (TYLENOL) 325 MG tablet Take 650 mg by mouth every 6 (six) hours as needed for headache.    Marland Kitchen amLODipine (NORVASC) 5 MG tablet TAKE 1 TABLET BY MOUTH DAILY 30 tablet 3  . aspirin 325 MG tablet Take 1 tablet (325 mg total) by mouth daily.    Marland Kitchen atorvastatin (LIPITOR) 40 MG tablet TAKE 1 TABLET BY MOUTH DAILY AT 6 PM. 30 tablet 4  . Blood Glucose Monitoring Suppl (ACCU-CHEK AVIVA PLUS) W/DEVICE KIT ACHS for E11.9 1 kit 0  . buPROPion (WELLBUTRIN SR) 150 MG 12 hr tablet TAKE 2 TABLETS IN THE MORNING AND 1 TABLET AT NIGHT 90 tablet 3  . fluticasone (FLONASE) 50 MCG/ACT nasal spray Place 2 sprays into both nostrils daily. (Patient taking differently: Place 2 sprays into both nostrils daily as needed for allergies. ) 16 g 0  . glucose blood (ACCU-CHEK AVIVA) test strip Check blood sugar ACHS for E11.9 100 each 12  . insulin glargine (LANTUS) 100 UNIT/ML injection Inject 0.38 mLs (38 Units total) into the skin at bedtime. 10 mL 5  . levothyroxine (SYNTHROID, LEVOTHROID) 100 MCG tablet Take 1 tablet (100  mcg total) by mouth daily. 30 tablet 4  . Liraglutide 18 MG/3ML SOPN Inject 0.2 mLs (1.2 mg total) into the skin daily. 6 mL 5  . lisinopril-hydrochlorothiazide (PRINZIDE,ZESTORETIC) 20-25 MG tablet Take 1 tablet by mouth daily. 30 tablet 4  . loratadine (CLARITIN) 10 MG tablet Take 1 tablet (10 mg total) by mouth daily. 30 tablet 0  . omeprazole (PRILOSEC) 40 MG capsule TAKE 1 CAPSULE BY MOUTH ONCE DAILY. 30 capsule 3  . traMADol (ULTRAM) 50 MG tablet Take 1 tablet (50 mg total) by mouth 2 (two) times daily. 60 tablet 0  . traMADol (ULTRAM) 50 MG tablet Take 1 tablet (50 mg total) by mouth 2 (two) times daily as needed. 20 tablet 0  . desloratadine (CLARINEX) 5 MG tablet Take 1 tablet (5 mg total) by mouth daily. (Patient not taking: Reported on 08/14/2015) 7 tablet 0  . Dextromethorphan-Guaifenesin (GUAIFENESIN DM) 400-20 MG TABS Take 1 tablet by mouth 2 (two) times daily. (Patient not taking: Reported on 08/14/2015) 20 tablet 0  . nitrofurantoin, macrocrystal-monohydrate, (MACROBID) 100 MG capsule Take 1 capsule (100 mg total) by mouth 2 (two) times daily. X 7 days (Patient not taking: Reported on 08/14/2015) 14 capsule 0   No current facility-administered medications for this visit.    OBJECTIVE: Appearance: alert, well appearing, and in no distress, oriented to person, place, and time and normal appearing weight. BP 127/69 mmHg  Pulse 93  Temp(Src) 98.5 F (36.9  C) (Oral)  Resp 18  Ht _0  (1.575 m)  Wt 130 lb (58.968 kg)  BMI 23.77 kg/m2  SpO2 95%  Physical Exam  Constitutional: She is oriented to person, place, and time.  Cardiovascular: Normal rate, regular rhythm and normal heart sounds.   Pulmonary/Chest: Effort normal and breath sounds normal.  Musculoskeletal: She exhibits no edema.  Neurological: She is alert and oriented to person, place, and time.  Psychiatric: She has a normal mood and affect.   ASSESSMENT: Sonia Skinner was seen today for follow-up.  Diagnoses and all  orders for this visit:  Type 2 diabetes mellitus with diabetic mononeuropathy, with long-term current use of insulin (HCC) -     Glucose (CBG) -     HgB A1c -     Ambulatory referral to Ophthalmology -     Lancet Devices [facility]) lancets; Check blood sugar ACHS for E11.9 -     INSULIN SYRINGE .5CC/28G 28G X 1/2" 0.5 ML MISC; Give insulin once per night -     glucose blood (ACCU-CHEK AVIVA) test strip; Check blood sugar ACHS for E11.9 -     Blood Glucose Monitoring Suppl (ACCU-CHEK AVIVA PLUS) w/Device KIT; ACHS for E11.9 -     COMPLETE METABOLIC PANEL WITH GFR -     insulin glargine (LANTUS) 100 UNIT/ML injection; Inject 0.38 mLs (38 Units total) into the skin at bedtime. A1C has improved. Overall goal for patient is A1C of 7%. I have encouraged her to stick with the 38 units prescribed and if more insulin is needed we can change the dose. She will return in 2 weeks for a log review.   HLD (hyperlipidemia) -     atorvastatin (LIPITOR) 40 MG tablet; TAKE 1 TABLET BY MOUTH DAILY AT 6 PM. Education provided on proper lifestyle changes in order to lower cholesterol. Patient advised to maintain healthy weight and to keep total fat intake at 25-35% of total calories and carbohydrates 50-60% of total daily calories. Explained how high cholesterol places patient at risk for heart disease. Patient placed on appropriate medication and repeat labs in 6 months   Essential hypertension -     lisinopril-hydrochlorothiazide (PRINZIDE,ZESTORETIC) 20-25 MG tablet; Take 1 tablet by mouth daily.  -     amLODipine (NORVASC) 5 MG tablet; Take 1 tablet (5 mg total) by mouth daily. Patient blood pressure is stable and may continue on current medication.  Education on diet, exercise, and modifiable risk factors discussed. Will obtain appropriate labs as needed. Will follow up in 3-6 months.   Thyroid disease -     TSH Patient has been taking synthroid and omeprazole at the same time. Will recheck level  today and educated on how to properly take medication.  Depression -     Ambulatory referral to Psychiatry  Breast cancer screening, high risk patient -     MM Digital Screening; Future  Colon cancer screening -     Ambulatory referral to Gastroenterology   Return in about 2 weeks (around 08/28/2015) for Nurse Visit-log review and 3 mo PCP .    Lance Bosch, NP 08/14/2015 6:55 PM

## 2015-08-14 NOTE — Patient Instructions (Signed)
Return in two weeks with Sonia Skinner for follow up on your blood pressure Return in three months for routine follow up with your primary doctor

## 2015-08-14 NOTE — Progress Notes (Signed)
Patient here for referrals. Patient reports patient was supposed to go see psychiatrist. Patient has not heard from anyone.  Patient needs diabetic eye exam, last 11/30/13. Last mammogram 05/22/2009. Patient would like new glucometer. Patient needs refills on wellbutrin, prilosec, lisinopril-hctz, victoza, amlodipine and omeprazole. Some of these have 1-2 refills left but patient would like more refills sent to pharmacy so she doesn't have to wait.  Patient reports abdominal pain, suspects gas, at level 6, decscribed as sharp, "like you can feel the air moving around through your intestinal tract".  Patient reports headache, at level 8, has been hurting all day, took tramadol at about 12, did not help much.   Patient partially completed PHQ9. Patient did answer "No" to question "I made plans to end my life in the last 2 weeks".

## 2015-08-15 LAB — COMPLETE METABOLIC PANEL WITH GFR
ALT: 19 U/L (ref 6–29)
AST: 13 U/L (ref 10–35)
Albumin: 4.6 g/dL (ref 3.6–5.1)
Alkaline Phosphatase: 129 U/L (ref 33–130)
BUN: 13 mg/dL (ref 7–25)
CO2: 24 mmol/L (ref 20–31)
Calcium: 9.5 mg/dL (ref 8.6–10.4)
Chloride: 101 mmol/L (ref 98–110)
Creat: 1.33 mg/dL — ABNORMAL HIGH (ref 0.50–0.99)
GFR, Est African American: 48 mL/min — ABNORMAL LOW (ref >=60)
GFR, Est Non African American: 42 mL/min — ABNORMAL LOW (ref >=60)
Glucose, Bld: 129 mg/dL — ABNORMAL HIGH (ref 65–99)
Potassium: 4.7 mmol/L (ref 3.5–5.3)
Sodium: 141 mmol/L (ref 135–146)
Total Bilirubin: 0.4 mg/dL (ref 0.2–1.2)
Total Protein: 7.1 g/dL (ref 6.1–8.1)

## 2015-08-19 ENCOUNTER — Telehealth: Payer: Self-pay

## 2015-08-19 DIAGNOSIS — R7989 Other specified abnormal findings of blood chemistry: Secondary | ICD-10-CM

## 2015-08-19 MED ORDER — LEVOTHYROXINE SODIUM 112 MCG PO TABS: 112.0000 ug | ORAL_TABLET | Freq: Every day | ORAL | Status: DC

## 2015-08-19 NOTE — Telephone Encounter (Signed)
-----   Message from Lance Bosch, NP sent at 08/15/2015  1:56 PM EST ----- Change Synthroid to 112 mcg daily. I think her levels have been off due to not taking medication properly. Please have her to come back for a repeat TSH in 8 weeks. Please place future lab order. Thanks

## 2015-08-19 NOTE — Telephone Encounter (Signed)
Spoke with patient this am and she is aware of the change to her thyroid medication Rx was sent to [facility] on [address] per patient request

## 2015-08-27 DIAGNOSIS — H25813 Combined forms of age-related cataract, bilateral: Secondary | ICD-10-CM | POA: Diagnosis not present

## 2015-08-27 DIAGNOSIS — E119 Type 2 diabetes mellitus without complications: Secondary | ICD-10-CM | POA: Diagnosis not present

## 2015-08-27 LAB — HM DIABETES EYE EXAM

## 2015-09-10 ENCOUNTER — Other Ambulatory Visit: Payer: Self-pay | Admitting: Internal Medicine

## 2015-09-18 ENCOUNTER — Other Ambulatory Visit: Payer: Self-pay

## 2015-09-18 DIAGNOSIS — Z1231 Encounter for screening mammogram for malignant neoplasm of breast: Secondary | ICD-10-CM

## 2015-10-01 DIAGNOSIS — E119 Type 2 diabetes mellitus without complications: Secondary | ICD-10-CM | POA: Diagnosis not present

## 2015-10-01 DIAGNOSIS — Z1211 Encounter for screening for malignant neoplasm of colon: Secondary | ICD-10-CM | POA: Diagnosis not present

## 2015-10-08 ENCOUNTER — Other Ambulatory Visit: Payer: Self-pay

## 2015-10-08 DIAGNOSIS — Z794 Long term (current) use of insulin: Principal | ICD-10-CM

## 2015-10-08 DIAGNOSIS — E1141 Type 2 diabetes mellitus with diabetic mononeuropathy: Secondary | ICD-10-CM

## 2015-10-08 MED ORDER — GLUCOSE BLOOD VI STRP: ORAL_STRIP | Status: DC

## 2015-10-20 ENCOUNTER — Encounter: Payer: Self-pay | Admitting: Internal Medicine

## 2015-11-05 ENCOUNTER — Encounter: Payer: Self-pay | Admitting: Psychology

## 2015-11-05 ENCOUNTER — Encounter: Payer: Self-pay | Admitting: Neurology

## 2015-11-13 ENCOUNTER — Other Ambulatory Visit: Payer: Self-pay | Admitting: Internal Medicine

## 2015-11-13 ENCOUNTER — Encounter: Payer: Self-pay | Admitting: Cardiology

## 2015-11-20 ENCOUNTER — Encounter: Payer: Self-pay | Admitting: Internal Medicine

## 2015-11-20 ENCOUNTER — Ambulatory Visit: Payer: Medicare Other | Attending: Internal Medicine | Admitting: Internal Medicine

## 2015-11-20 VITALS — BP 150/70 | HR 92 | Temp 98.2°F | Resp 16 | Ht 62.0 in | Wt 128.8 lb

## 2015-11-20 DIAGNOSIS — F329 Major depressive disorder, single episode, unspecified: Secondary | ICD-10-CM

## 2015-11-20 DIAGNOSIS — E1141 Type 2 diabetes mellitus with diabetic mononeuropathy: Secondary | ICD-10-CM | POA: Diagnosis not present

## 2015-11-20 DIAGNOSIS — F32A Depression, unspecified: Secondary | ICD-10-CM

## 2015-11-20 DIAGNOSIS — E119 Type 2 diabetes mellitus without complications: Secondary | ICD-10-CM | POA: Insufficient documentation

## 2015-11-20 DIAGNOSIS — Z794 Long term (current) use of insulin: Secondary | ICD-10-CM

## 2015-11-20 DIAGNOSIS — Z79899 Other long term (current) drug therapy: Secondary | ICD-10-CM | POA: Diagnosis not present

## 2015-11-20 LAB — GLUCOSE, POCT (MANUAL RESULT ENTRY): POC Glucose: 109 mg/dl — AB (ref 70–99)

## 2015-11-20 LAB — POCT GLYCOSYLATED HEMOGLOBIN (HGB A1C): Hemoglobin A1C: 9.2

## 2015-11-20 MED ORDER — AMITRIPTYLINE HCL 25 MG PO TABS: 25.0000 mg | ORAL_TABLET | Freq: Every day | ORAL | Status: DC

## 2015-11-20 NOTE — Progress Notes (Signed)
Patient ID: Sonia Skinner, female   DOB: January 27, 1951, 65 y.o.   MRN: [id number] SUBJECTIVE: 65 y.o. female for follow up of diabetes. Diabetic Review of Systems - medication compliance: compliant most of the time, diabetic diet compliance: noncompliant much of the time, further diabetic ROS: no polyuria or polydipsia, no chest pain, dyspnea or TIA's, no hypoglycemia, has dysesthesias in the feet, blurry vision.  Other symptoms and concerns: Patient states that she has been feeling more depressed than usual knows that it is affecting her motivation to get her diabetes controlled. She states that she has been on a number of SSRI's in the past and did not do well with any except paxil. She states that since her depression has worsened she has daily migraines as well. She denies SI.   Current Outpatient Prescriptions  Medication Sig Dispense Refill  . fluticasone (FLONASE) 50 MCG/ACT nasal spray Place 2 sprays into both nostrils daily. (Patient taking differently: Place 2 sprays into both nostrils daily as needed for allergies. ) 16 g 0  . glucose blood (ACCU-CHEK AVIVA) test strip Check blood sugar ACHS for E11.9 100 each 12  . insulin glargine (LANTUS) 100 UNIT/ML injection Inject 0.38 mLs (38 Units total) into the skin at bedtime. 10 mL 5  . INSULIN SYRINGE .5CC/28G 28G X 1/2" 0.5 ML MISC Give insulin once per night 100 each 12  . Lancet Devices (ACCU-CHEK SOFTCLIX) lancets Check blood sugar ACHS for E11.9 1 each 11  . levothyroxine (SYNTHROID, LEVOTHROID) 100 MCG tablet Take 1 tablet (100 mcg total) by mouth daily. 30 tablet 4  . Liraglutide (VICTOZA) 18 MG/3ML SOPN Inject 0.2 mLs (1.2 mg total) into the skin daily. Needs office visit for refills 3 pen 0  . lisinopril-hydrochlorothiazide (PRINZIDE,ZESTORETIC) 20-25 MG tablet Take 1 tablet by mouth daily. 30 tablet 4  . loratadine (CLARITIN) 10 MG tablet Take 1 tablet (10 mg total) by mouth daily. 30 tablet 0  . omeprazole (PRILOSEC) 40 MG capsule TAKE 1  CAPSULE BY MOUTH ONCE DAILY. 30 capsule 3  . traMADol (ULTRAM) 50 MG tablet Take 1 tablet (50 mg total) by mouth 2 (two) times daily. 60 tablet 0  . ACCU-CHEK SOFTCLIX LANCETS lancets ACHS for E11.9 100 each 12  . acetaminophen (TYLENOL) 325 MG tablet Take 650 mg by mouth every 6 (six) hours as needed for headache.    Marland Kitchen amLODipine (NORVASC) 5 MG tablet Take 1 tablet (5 mg total) by mouth daily. 30 tablet 3  . aspirin 325 MG tablet Take 1 tablet (325 mg total) by mouth daily.    Marland Kitchen atorvastatin (LIPITOR) 40 MG tablet TAKE 1 TABLET BY MOUTH DAILY AT 6 PM. 30 tablet 4  . Blood Glucose Monitoring Suppl (ACCU-CHEK AVIVA PLUS) w/Device KIT ACHS for E11.9 1 kit 0  . buPROPion (WELLBUTRIN SR) 150 MG 12 hr tablet TAKE TWO TABLETS BY MOUTH IN THE MORNING AND ONE AT NIGHT 90 tablet 2  . desloratadine (CLARINEX) 5 MG tablet Take 1 tablet (5 mg total) by mouth daily. (Patient not taking: Reported on 08/14/2015) 7 tablet 0  . Dextromethorphan-Guaifenesin (GUAIFENESIN DM) 400-20 MG TABS Take 1 tablet by mouth 2 (two) times daily. (Patient not taking: Reported on 08/14/2015) 20 tablet 0  . nitrofurantoin, macrocrystal-monohydrate, (MACROBID) 100 MG capsule Take 1 capsule (100 mg total) by mouth 2 (two) times daily. X 7 days (Patient not taking: Reported on 08/14/2015) 14 capsule 0   No current facility-administered medications for this visit.  ROS: Other than what  is stated in HPI, all other systems are negative.   OBJECTIVE: Appearance: alert, well appearing, and in no distress, oriented to person, place, and time and normal appearing weight. BP 169/80 mmHg  Pulse 92  Temp(Src) 98.2 F (36.8 C) (Oral)  Resp 16  Ht _0  (1.575 m)  Wt 128 lb 12.8 oz (58.423 kg)  BMI 23.55 kg/m2  SpO2 100%  Exam: heart sounds normal rate, regular rhythm, normal S1, S2, no murmurs, rubs, clicks or gallops, no JVD, chest clear, no carotid bruits  ASSESSMENT: Sonia Skinner was seen today for follow-up.  Diagnoses and all orders  for this visit:  Type 2 diabetes mellitus with diabetic mononeuropathy, with long-term current use of insulin (HCC) -     POCT glucose (manual entry) -     POCT glycosylated hemoglobin (Hb A1C) Patients diabetes remains uncontrolled as evidence by hemoglobin a1c >8.  Patient has been non-compliant with medication regimen. Stressed the multiple complications associated with uncontrolled diabetes.  Patient will stay on current medication dose and report back to clinic with cbg log in 2 weeks.  Depression -     Ambulatory referral to Psychiatry -     Begin amitriptyline (ELAVIL) 25 MG tablet; Take 1 tablet (25 mg total) by mouth at bedtime. For depression and migraines Started amitriptyline because it may potentially help with depression and with chronic migraines.    PLAN: See orders for this visit as documented in the electronic medical record. Issues reviewed with her: diabetic diet discussed in detail, written exchange diet given, low cholesterol diet, weight control and daily exercise discussed, annual eye examinations at Ophthalmology discussed and long term diabetic complications discussed.   Return in about 3 months (around 02/19/2016) for DM/HTN.  Lance Bosch, NP 11/28/2015 12:44 AM

## 2015-11-20 NOTE — Progress Notes (Signed)
Patient's here for 76month f/up.   Patient reports feeling okay.  Patient requesting med refill of Tramadol.

## 2015-11-20 NOTE — Patient Instructions (Signed)
Amitriptyline tablets  What is this medicine?  AMITRIPTYLINE (a mee TRIP ti leen) is used to treat depression.  This medicine may be used for other purposes; ask your health care provider or pharmacist if you have questions.  What should I tell my health care provider before I take this medicine?  They need to know if you have any of these conditions:  -an alcohol problem  -asthma, difficulty breathing  -bipolar disorder or schizophrenia  -difficulty passing urine, prostate trouble  -glaucoma  -heart disease or previous heart attack  -liver disease  -over active thyroid  -seizures  -thoughts or plans of suicide, a previous suicide attempt, or family history of suicide attempt  -an unusual or allergic reaction to amitriptyline, other medicines, foods, dyes, or preservatives  -pregnant or trying to get pregnant  -breast-feeding  How should I use this medicine?  Take this medicine by mouth with a drink of water. Follow the directions on the prescription label. You can take the tablets with or without food. Take your medicine at regular intervals. Do not take it more often than directed. Do not stop taking this medicine suddenly except upon the advice of your doctor. Stopping this medicine too quickly may cause serious side effects or your condition may worsen.  A special MedGuide will be given to you by the pharmacist with each prescription and refill. Be sure to read this information carefully each time.  Talk to your pediatrician regarding the use of this medicine in children. Special care may be needed.  Overdosage: If you think you have taken too much of this medicine contact a poison control center or emergency room at once.  NOTE: This medicine is only for you. Do not share this medicine with others.  What if I miss a dose?  If you miss a dose, take it as soon as you can. If it is almost time for your next dose, take only that dose. Do not take double or extra doses.  What may interact with this medicine?  Do not  take this medicine with any of the following medications:  -arsenic trioxide  -certain medicines used to regulate abnormal heartbeat or to treat other heart conditions  -cisapride  -droperidol  -halofantrine  -linezolid  -MAOIs like Carbex, Eldepryl, Marplan, Nardil, and Parnate  -methylene blue  -other medicines for mental depression  -phenothiazines like perphenazine, thioridazine and chlorpromazine  -pimozide  -probucol  -procarbazine  -sparfloxacin  -St. John's Wort  -ziprasidone  This medicine may also interact with the following medications:  -atropine and related drugs like hyoscyamine, scopolamine, tolterodine and others  -barbiturate medicines for inducing sleep or treating seizures, like phenobarbital  -cimetidine  -disulfiram  -ethchlorvynol  -thyroid hormones such as levothyroxine  This list may not describe all possible interactions. Give your health care provider a list of all the medicines, herbs, non-prescription drugs, or dietary supplements you use. Also tell them if you smoke, drink alcohol, or use illegal drugs. Some items may interact with your medicine.  What should I watch for while using this medicine?  Tell your doctor if your symptoms do not get better or if they get worse. Visit your doctor or health care professional for regular checks on your progress. Because it may take several weeks to see the full effects of this medicine, it is important to continue your treatment as prescribed by your doctor.  Patients and their families should watch out for new or worsening thoughts of suicide or depression. Also watch out   for sudden changes in feelings such as feeling anxious, agitated, panicky, irritable, hostile, aggressive, impulsive, severely restless, overly excited and hyperactive, or not being able to sleep. If this happens, especially at the beginning of treatment or after a change in dose, call your health care professional.  You may get drowsy or dizzy. Do not drive, use machinery, or  do anything that needs mental alertness until you know how this medicine affects you. Do not stand or sit up quickly, especially if you are an older patient. This reduces the risk of dizzy or fainting spells. Alcohol may interfere with the effect of this medicine. Avoid alcoholic drinks.  Do not treat yourself for coughs, colds, or allergies without asking your doctor or health care professional for advice. Some ingredients can increase possible side effects.  Your mouth may get dry. Chewing sugarless gum or sucking hard candy, and drinking plenty of water will help. Contact your doctor if the problem does not go away or is severe.  This medicine may cause dry eyes and blurred vision. If you wear contact lenses you may feel some discomfort. Lubricating drops may help. See your eye doctor if the problem does not go away or is severe.  This medicine can cause constipation. Try to have a bowel movement at least every 2 to 3 days. If you do not have a bowel movement for 3 days, call your doctor or health care professional.  This medicine can make you more sensitive to the sun. Keep out of the sun. If you cannot avoid being in the sun, wear protective clothing and use sunscreen. Do not use sun lamps or tanning beds/booths.  What side effects may I notice from receiving this medicine?  Side effects that you should report to your doctor or health care professional as soon as possible:  -allergic reactions like skin rash, itching or hives, swelling of the face, lips, or tongue  -abnormal production of milk in females  -breast enlargement in both males and females  -breathing problems  -confusion, hallucinations  -fast, irregular heartbeat  -fever with increased sweating  -muscle stiffness, or spasms  -pain or difficulty passing urine, loss of bladder control  -seizures  -suicidal thoughts or other mood changes  -swelling of the testicles  -tingling, pain, or numbness in the feet or hands  -yellowing of the eyes or  skin  Side effects that usually do not require medical attention (report to your doctor or health care professional if they continue or are bothersome):  -change in sex drive or performance  -constipation or diarrhea  -nausea, vomiting  -weight gain or loss  This list may not describe all possible side effects. Call your doctor for medical advice about side effects. You may report side effects to FDA at 1-800-FDA-1088.  Where should I keep my medicine?  Keep out of the reach of children.  Store at room temperature between 20 and 25 degrees C (68 and 77 degrees F). Throw away any unused medicine after the expiration date.  NOTE: This sheet is a summary. It may not cover all possible information. If you have questions about this medicine, talk to your doctor, pharmacist, or health care provider.     © 2016, [employer] Standard. (2011-12-20 13:50:32)

## 2015-11-25 ENCOUNTER — Encounter (HOSPITAL_COMMUNITY): Payer: Self-pay | Admitting: Emergency Medicine

## 2015-11-25 ENCOUNTER — Emergency Department (HOSPITAL_COMMUNITY)
Admission: EM | Admit: 2015-11-25 | Discharge: 2015-11-25 | Disposition: A | Discharge status: Home / Self Care | Payer: Medicare Other | Attending: Emergency Medicine | Admitting: Emergency Medicine

## 2015-11-25 DIAGNOSIS — R55 Syncope and collapse: Secondary | ICD-10-CM | POA: Diagnosis not present

## 2015-11-25 DIAGNOSIS — Z7984 Long term (current) use of oral hypoglycemic drugs: Secondary | ICD-10-CM | POA: Insufficient documentation

## 2015-11-25 DIAGNOSIS — Z794 Long term (current) use of insulin: Secondary | ICD-10-CM | POA: Diagnosis not present

## 2015-11-25 DIAGNOSIS — I10 Essential (primary) hypertension: Secondary | ICD-10-CM | POA: Diagnosis not present

## 2015-11-25 DIAGNOSIS — R031 Nonspecific low blood-pressure reading: Secondary | ICD-10-CM | POA: Diagnosis not present

## 2015-11-25 DIAGNOSIS — R404 Transient alteration of awareness: Secondary | ICD-10-CM | POA: Diagnosis not present

## 2015-11-25 DIAGNOSIS — K219 Gastro-esophageal reflux disease without esophagitis: Secondary | ICD-10-CM | POA: Insufficient documentation

## 2015-11-25 DIAGNOSIS — D120 Benign neoplasm of cecum: Secondary | ICD-10-CM | POA: Diagnosis not present

## 2015-11-25 DIAGNOSIS — Z79899 Other long term (current) drug therapy: Secondary | ICD-10-CM | POA: Diagnosis not present

## 2015-11-25 DIAGNOSIS — R197 Diarrhea, unspecified: Secondary | ICD-10-CM | POA: Diagnosis not present

## 2015-11-25 DIAGNOSIS — R531 Weakness: Secondary | ICD-10-CM | POA: Diagnosis not present

## 2015-11-25 DIAGNOSIS — Z8673 Personal history of transient ischemic attack (TIA), and cerebral infarction without residual deficits: Secondary | ICD-10-CM | POA: Insufficient documentation

## 2015-11-25 DIAGNOSIS — R42 Dizziness and giddiness: Secondary | ICD-10-CM

## 2015-11-25 DIAGNOSIS — E119 Type 2 diabetes mellitus without complications: Secondary | ICD-10-CM | POA: Diagnosis not present

## 2015-11-25 DIAGNOSIS — E079 Disorder of thyroid, unspecified: Secondary | ICD-10-CM | POA: Insufficient documentation

## 2015-11-25 DIAGNOSIS — Z87891 Personal history of nicotine dependence: Secondary | ICD-10-CM | POA: Diagnosis not present

## 2015-11-25 DIAGNOSIS — K5730 Diverticulosis of large intestine without perforation or abscess without bleeding: Secondary | ICD-10-CM | POA: Diagnosis not present

## 2015-11-25 DIAGNOSIS — F419 Anxiety disorder, unspecified: Secondary | ICD-10-CM | POA: Insufficient documentation

## 2015-11-25 DIAGNOSIS — F329 Major depressive disorder, single episode, unspecified: Secondary | ICD-10-CM | POA: Diagnosis not present

## 2015-11-25 DIAGNOSIS — D126 Benign neoplasm of colon, unspecified: Secondary | ICD-10-CM | POA: Diagnosis not present

## 2015-11-25 DIAGNOSIS — Z1211 Encounter for screening for malignant neoplasm of colon: Secondary | ICD-10-CM | POA: Diagnosis not present

## 2015-11-25 LAB — BASIC METABOLIC PANEL
Anion gap: 14 (ref 5–15)
BUN: 18 mg/dL (ref 6–20)
CO2: 26 mmol/L (ref 22–32)
Calcium: 9.6 mg/dL (ref 8.9–10.3)
Chloride: 100 mmol/L — ABNORMAL LOW (ref 101–111)
Creatinine, Ser: 1.51 mg/dL — ABNORMAL HIGH (ref 0.44–1.00)
GFR calc Af Amer: 40 mL/min — ABNORMAL LOW (ref >60)
GFR calc non Af Amer: 35 mL/min — ABNORMAL LOW (ref >60)
Glucose, Bld: 238 mg/dL — ABNORMAL HIGH (ref 65–99)
Potassium: 3.2 mmol/L — ABNORMAL LOW (ref 3.5–5.1)
Sodium: 140 mmol/L (ref 135–145)

## 2015-11-25 LAB — CBC WITH DIFFERENTIAL/PLATELET
Basophils Absolute: 0 10*3/uL (ref 0.0–0.1)
Basophils Relative: 0 %
Eosinophils Absolute: 0.3 10*3/uL (ref 0.0–0.7)
Eosinophils Relative: 2 %
HCT: 42.3 % (ref 36.0–46.0)
Hemoglobin: 13.6 g/dL (ref 12.0–15.0)
Lymphocytes Relative: 28 %
Lymphs Abs: 3.9 10*3/uL (ref 0.7–4.0)
MCH: 27.9 pg (ref 26.0–34.0)
MCHC: 32.2 g/dL (ref 30.0–36.0)
MCV: 86.7 fL (ref 78.0–100.0)
Monocytes Absolute: 0.8 10*3/uL (ref 0.1–1.0)
Monocytes Relative: 6 %
Neutro Abs: 9 10*3/uL — ABNORMAL HIGH (ref 1.7–7.7)
Neutrophils Relative %: 64 %
Platelets: ADEQUATE 10*3/uL (ref 150–400)
RBC: 4.88 MIL/uL (ref 3.87–5.11)
RDW: 12.9 % (ref 11.5–15.5)
WBC: 14 10*3/uL — ABNORMAL HIGH (ref 4.0–10.5)

## 2015-11-25 LAB — I-STAT TROPONIN, ED: Troponin i, poc: 0 ng/mL (ref 0.00–0.08)

## 2015-11-25 LAB — HM COLONOSCOPY

## 2015-11-25 MED ORDER — SODIUM CHLORIDE 0.9 % IV BOLUS (SEPSIS)
1000.0000 mL | Freq: Once | INTRAVENOUS | Status: AC
  Administered 2015-11-25: 1000 mL via INTRAVENOUS

## 2015-11-25 NOTE — ED Notes (Signed)
Pt ambulatory down POD A hallway without difficulty. Gait steady without assistance. Denies dizziness.

## 2015-11-25 NOTE — ED Provider Notes (Addendum)
CSN: [id number]     Arrival date & time 11/25/15  0540 History   First MD Initiated Contact with Patient 11/25/15 0549     Chief Complaint  Patient presents with  . Dizziness  . Fall     (Consider location/radiation/quality/duration/timing/severity/associated sxs/prior Treatment) HPI  This is a 65 year old female with a history of hypertension, thyroid disease, stroke who presents with dizziness and near syncope. Patient reports that she is prepping for a routine colonoscopy later this morning at 7:30 (Dr. Paulita Fujita).  She has been taking prep since 6 PM last night. She has had multiple nonbloody stools. She states that earlier this morning she developed bathroom and had a episode of watery stool. Following the bowel movement, she felt very dizzy. She reports lightheadedness. She denies room spinning dizziness. She states that when she stood up she felt very off balance and lightheaded. She had to drop to her knees. She did not pass out. She felt as if she would pass out. She denied any chest pain or shortness of breath with this episode.  Past Medical History  Diagnosis Date  . Hypertension   . Thyroid disease   . Complication of anesthesia     ALLERY TO ESTER BASE  . Diabetes mellitus     INSULIN DEPENDENT  . GERD (gastroesophageal reflux disease)   . Carotid artery occlusion   . Stroke [facility]) March 2015     left MCA infarct  . Depression   . Anxiety   . Headache    Past Surgical History  Procedure Laterality Date  . Abdominal hysterectomy    . Endarterectomy Left 11/04/2013  . Carotid endarterectomy Left 11-04-13    cea   Family History  Problem Relation Age of Onset  . Diabetes Mother   . Heart disease Mother     Before age 94  . Cancer Father     Lung  . Hypertension Sister   . Diabetes Sister   . Diabetes Sister   . Diabetes Sister    Social History  Substance Use Topics  . Smoking status: Former Smoker -- 0.20 packs/day for 20 years    Types: Cigarettes    Quit  date: 11/01/2013  . Smokeless tobacco: Never Used  . Alcohol Use: No   OB History    No data available     Review of Systems  Constitutional: Negative for fever.  Respiratory: Negative for chest tightness and shortness of breath.   Gastrointestinal: Positive for diarrhea. Negative for vomiting and abdominal pain.  Neurological: Positive for weakness and light-headedness. Negative for syncope.       Near syncope  All other systems reviewed and are negative.     Allergies  Anesthetics, ester  Home Medications   Prior to Admission medications   Medication Sig Start Date End Date Taking? Authorizing Provider  amitriptyline (ELAVIL) 25 MG tablet Take 1 tablet (25 mg total) by mouth at bedtime. For depression and migraines 11/20/15  Yes Lance Bosch, NP  amLODipine (NORVASC) 5 MG tablet Take 1 tablet (5 mg total) by mouth daily. 08/14/15  Yes Lance Bosch, NP  atorvastatin (LIPITOR) 40 MG tablet TAKE 1 TABLET BY MOUTH DAILY AT 6 PM. 08/14/15  Yes Lance Bosch, NP  buPROPion (WELLBUTRIN SR) 150 MG 12 hr tablet TAKE TWO TABLETS BY MOUTH IN THE MORNING AND ONE AT NIGHT 09/10/15  Yes Lance Bosch, NP  GAVILYTE-N WITH FLAVOR PACK 420 g solution Take 4,000 mLs by mouth once.  11/24/15  Yes Historical Provider, MD  insulin glargine (LANTUS) 100 UNIT/ML injection Inject 0.38 mLs (38 Units total) into the skin at bedtime. 08/14/15  Yes Lance Bosch, NP  levothyroxine (SYNTHROID, LEVOTHROID) 112 MCG tablet Take 112 mcg by mouth daily. 08/19/15  Yes Historical Provider, MD  Liraglutide (VICTOZA) 18 MG/3ML SOPN Inject 0.2 mLs (1.2 mg total) into the skin daily. Needs office visit for refills 11/13/15  Yes Lance Bosch, NP  lisinopril-hydrochlorothiazide (PRINZIDE,ZESTORETIC) 20-25 MG tablet Take 1 tablet by mouth daily. 08/14/15  Yes Lance Bosch, NP  loratadine (CLARITIN) 10 MG tablet Take 1 tablet (10 mg total) by mouth daily. 07/08/15  Yes Tresa Garter, MD  omeprazole (PRILOSEC) 40  MG capsule TAKE 1 CAPSULE BY MOUTH ONCE DAILY. 08/14/15  Yes Lance Bosch, NP  ACCU-CHEK SOFTCLIX LANCETS lancets ACHS for E11.9 03/25/15   Lance Bosch, NP  aspirin 325 MG tablet Take 1 tablet (325 mg total) by mouth daily. Patient not taking: Reported on 11/25/2015 11/06/13   Samella Parr, NP  Blood Glucose Monitoring Suppl (ACCU-CHEK AVIVA PLUS) w/Device KIT ACHS for E11.9 08/14/15   Lance Bosch, NP  desloratadine (CLARINEX) 5 MG tablet Take 1 tablet (5 mg total) by mouth daily. Patient not taking: Reported on 08/14/2015 04/16/15   April Palumbo, MD  Dextromethorphan-Guaifenesin (GUAIFENESIN DM) 400-20 MG TABS Take 1 tablet by mouth 2 (two) times daily. Patient not taking: Reported on 08/14/2015 04/16/15   April Palumbo, MD  fluticasone [facility]) 50 MCG/ACT nasal spray Place 2 sprays into both nostrils daily. Patient not taking: Reported on 11/25/2015 04/16/15   April Palumbo, MD  glucose blood (ACCU-CHEK AVIVA) test strip Check blood sugar ACHS for E11.9 10/08/15   Lance Bosch, NP  INSULIN SYRINGE .5CC/28G 28G X 1/2" 0.5 ML MISC Give insulin once per night 08/14/15   Lance Bosch, NP  Lancet Devices [facility]) lancets Check blood sugar ACHS for E11.9 08/14/15   Lance Bosch, NP  levothyroxine (SYNTHROID, LEVOTHROID) 100 MCG tablet Take 1 tablet (100 mcg total) by mouth daily. Patient not taking: Reported on 11/25/2015 08/06/15   Lance Bosch, NP  nitrofurantoin, macrocrystal-monohydrate, (MACROBID) 100 MG capsule Take 1 capsule (100 mg total) by mouth 2 (two) times daily. X 7 days Patient not taking: Reported on 08/14/2015 06/12/15   Debby Freiberg, MD   BP 115/57 mmHg  Pulse 84  Temp(Src) 97.7 F (36.5 C) (Oral)  Resp 18  SpO2 100% Physical Exam  Constitutional: She is oriented to person, place, and time. She appears well-developed and well-nourished.  HENT:  Head: Normocephalic and atraumatic.  Mouth/Throat: Oropharynx is clear and moist.  Eyes: Pupils are  equal, round, and reactive to light.  Cardiovascular: Normal rate, regular rhythm and normal heart sounds.   No murmur heard. Pulmonary/Chest: Effort normal and breath sounds normal. No respiratory distress. She has no wheezes.  Abdominal: Soft. Bowel sounds are normal. There is no tenderness. There is no rebound.  Neurological: She is alert and oriented to person, place, and time.  Cranial nerves II through XII intact, 5 out of 5 strength in all 4 extremities, no dysmetria to finger-nose-finger  Skin: Skin is warm and dry.  Psychiatric: She has a normal mood and affect.  Nursing note and vitals reviewed.   ED Course  Procedures (including critical care time) Labs Review Labs Reviewed  CBC WITH DIFFERENTIAL/PLATELET - Abnormal; Notable for the following:    WBC 14.0 (*)  Neutro Abs 9.0 (*)    All other components within normal limits  BASIC METABOLIC PANEL - Abnormal; Notable for the following:    Potassium 3.2 (*)    Chloride 100 (*)    Glucose, Bld 238 (*)    Creatinine, Ser 1.51 (*)    GFR calc non Af Amer 35 (*)    GFR calc Af Amer 40 (*)    All other components within normal limits  I-STAT TROPOININ, ED    Imaging Review No results found. I have personally reviewed and evaluated these images and lab results as part of my medical decision-making.   EKG Interpretation   Date/Time:  Tuesday November 25 2015 05:57:59 EDT Ventricular Rate:  77 PR Interval:  159 QRS Duration: 77 QT Interval:  372 QTC Calculation: 421 R Axis:   25 Text Interpretation:  Sinus rhythm Low voltage, precordial leads Confirmed  by HORTON  MD, COURTNEY ([id number]) on 11/25/2015 6:04:44 AM      MDM   Final diagnoses:  Dizziness    Patient presents with dizziness.  Currently prepping for a colonoscopy.  No syncope.  Patient currently is assymptomatic.  EKG without evidence of arrythmia.  Not significantly orthostatic.  Neuro intact.  Given fluids.  Creatinine 1.51.  Baseline 1.3.  Feels better  after fluids.  Discussed patient with Dr. Watt Climes on call to let him know that patient would likely miss her scheduled colonoscopy.  Will page Dr. Paulita Fujita when w/u done.   Work-up reassuring.  Suspect dizziness likely related to colon prep and dehyrdation.   Attempted x 2 to page Dr. Paulita Fujita through his answering service.  Did not receive a call back.  Patient d/c'd at 8 AM.  Instructed to go immediately to [facility] for instruction regarding planned colonoscopy that was missed.  After history, exam, and medical workup I feel the patient has been appropriately medically screened and is safe for discharge home. Pertinent diagnoses were discussed with the patient. Patient was given return precautions.     Merryl Hacker, MD 11/25/15 [id number]  Merryl Hacker, MD 11/25/15 [phone]

## 2015-11-25 NOTE — ED Notes (Signed)
Patient arrives via EMS with complaint of dizziness and fall x1. Has been drinking bowel prep solution for colonoscopy that is scheduled for tomorrow. Denies LOC and other complaints. Declined IV start via EMS.

## 2015-11-25 NOTE — Discharge Instructions (Signed)
You were seen today for dizziness.  Labs and EKG are reassuring.  THis may be related to you recent GI prep and dehydration.  Go directly to GI clinic for further instruction regarding your procedure today.  Dizziness Dizziness is a common problem. It is a feeling of unsteadiness or light-headedness. You may feel like you are about to faint. Dizziness can lead to injury if you stumble or fall. Anyone can become dizzy, but dizziness is more common in older adults. This condition can be caused by a number of things, including medicines, dehydration, or illness. HOME CARE INSTRUCTIONS Taking these steps may help with your condition: Eating and Drinking  Drink enough fluid to keep your urine clear or pale yellow. This helps to keep you from becoming dehydrated. Try to drink more clear fluids, such as water.  Do not drink alcohol.  Limit your caffeine intake if directed by your health care provider.  Limit your salt intake if directed by your health care provider. Activity  Avoid making quick movements.  Rise slowly from chairs and steady yourself until you feel okay.  In the morning, first sit up on the side of the bed. When you feel okay, stand slowly while you hold onto something until you know that your balance is fine.  Move your legs often if you need to stand in one place for a long time. Tighten and relax your muscles in your legs while you are standing.  Do not drive or operate heavy machinery if you feel dizzy.  Avoid bending down if you feel dizzy. Place items in your home so that they are easy for you to reach without leaning over. Lifestyle  Do not use any tobacco products, including cigarettes, chewing tobacco, or electronic cigarettes. If you need help quitting, ask your health care provider.  Try to reduce your stress level, such as with yoga or meditation. Talk with your health care provider if you need help. General Instructions  Watch your dizziness for any  changes.  Take medicines only as directed by your health care provider. Talk with your health care provider if you think that your dizziness is caused by a medicine that you are taking.  Tell a friend or a family member that you are feeling dizzy. If he or she notices any changes in your behavior, have this person call your health care provider.  Keep all follow-up visits as directed by your health care provider. This is important. SEEK MEDICAL CARE IF:  Your dizziness does not go away.  Your dizziness or light-headedness gets worse.  You feel nauseous.  You have reduced hearing.  You have new symptoms.  You are unsteady on your feet or you feel like the room is spinning. SEEK IMMEDIATE MEDICAL CARE IF:  You vomit or have diarrhea and are unable to eat or drink anything.  You have problems talking, walking, swallowing, or using your arms, hands, or legs.  You feel generally weak.  You are not thinking clearly or you have trouble forming sentences. It may take a friend or family member to notice this.  You have chest pain, abdominal pain, shortness of breath, or sweating.  Your vision changes.  You notice any bleeding.  You have a headache.  You have neck pain or a stiff neck.  You have a fever.   This information is not intended to replace advice given to you by your health care provider. Make sure you discuss any questions you have with your health care  provider.   Document Released: 01/26/2001 Document Revised: 12/17/2014 Document Reviewed: 07/29/2014 Elsevier Interactive Patient Education [employer].

## 2015-11-26 ENCOUNTER — Encounter (HOSPITAL_COMMUNITY): Payer: Medicare Other

## 2015-12-03 ENCOUNTER — Ambulatory Visit: Payer: Medicare Other | Admitting: Vascular Surgery

## 2015-12-04 ENCOUNTER — Other Ambulatory Visit: Payer: Self-pay | Admitting: Internal Medicine

## 2015-12-21 ENCOUNTER — Other Ambulatory Visit: Payer: Self-pay | Admitting: Internal Medicine

## 2015-12-26 ENCOUNTER — Ambulatory Visit
Admission: RE | Admit: 2015-12-26 | Discharge: 2015-12-26 | Disposition: A | Discharge status: Home / Self Care | Payer: Medicare Other | Source: Ambulatory Visit

## 2015-12-26 DIAGNOSIS — Z1231 Encounter for screening mammogram for malignant neoplasm of breast: Secondary | ICD-10-CM | POA: Diagnosis not present

## 2016-01-18 ENCOUNTER — Other Ambulatory Visit: Payer: Self-pay | Admitting: Internal Medicine

## 2016-01-23 ENCOUNTER — Other Ambulatory Visit: Payer: Self-pay | Admitting: Pharmacist

## 2016-01-23 DIAGNOSIS — Z794 Long term (current) use of insulin: Principal | ICD-10-CM

## 2016-01-23 DIAGNOSIS — E1141 Type 2 diabetes mellitus with diabetic mononeuropathy: Secondary | ICD-10-CM

## 2016-01-23 MED ORDER — GLUCOSE BLOOD VI STRP: ORAL_STRIP | Status: DC

## 2016-01-27 ENCOUNTER — Encounter (HOSPITAL_COMMUNITY): Payer: Self-pay | Admitting: Psychiatry

## 2016-01-27 ENCOUNTER — Ambulatory Visit (INDEPENDENT_AMBULATORY_CARE_PROVIDER_SITE_OTHER): Payer: Medicare Other | Admitting: Psychiatry

## 2016-01-27 VITALS — BP 132/72 | HR 96 | Ht 62.0 in | Wt 123.6 lb

## 2016-01-27 DIAGNOSIS — F331 Major depressive disorder, recurrent, moderate: Secondary | ICD-10-CM

## 2016-01-27 DIAGNOSIS — G4700 Insomnia, unspecified: Secondary | ICD-10-CM | POA: Diagnosis not present

## 2016-01-27 MED ORDER — ESCITALOPRAM OXALATE 20 MG PO TABS: ORAL_TABLET | ORAL | Status: DC

## 2016-01-27 NOTE — Patient Instructions (Signed)
Reduced Wellbutrin to 150 mg in AM only for 1 week and than stop. Start Lexapro 10 mg daily for 1 week and than 20 mg daily.

## 2016-01-27 NOTE — Progress Notes (Signed)
[facility] Initial Assessment Note  Sonia Skinner [id number] 65 y.o.  01/27/2016 9:53 AM  Chief Complaint:  I'm very depressed.  I cannot function.  History of Present Illness:  Patient is 65 year old African-American, employed, divorced female who is referred from her primary care physician for the management of depression and anxiety symptoms.  Patient told she had a history of depression and she is worried that she is having the same symptoms again.  Patient mentioned lately she's been feeling more irritable, mood swings, easily agitated, poor sleep, lack of motivation, fatigue, memory impairment and difficulty completing a task and easily tired.  She mentioned the symptoms started to get worse in past 2 years.  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  She admitted there are times when she raises her voice.  She endorse sometime feeling of hopelessness and worthlessness but denies any suicidal attempt or any thinking.  Patient also mentioned few years ago she had a stroke that caused left-sided weakness resulting hearing impairment and difficulty writing on left side.  She also endorse tingling and numbness.  She's been seeing Dr. Leonie Man for these symptoms.  Patient is very worried about her memory impairment and like to get treatment for her depression.  In the past she remember vaguely about the medication.  She is not sure but tried Celexa and trazodone.  She's also taking Wellbutrin 450 mg a day which was started a few years ago for cigarettes smoking.  Recently she was given amitriptyline 25 mg to help the insomnia by her primary care physician.  She's seen some improvement in her sleep the patient denies any paranoia, hallucination, self abusive behavior, mania, psychosis, OCD, PTSD or any panic attacks.  Her appetite is  fair.  She has history of losing and gaining weight.  Patient denies drinking or using any illegal substances.  She lives with her sister.    Suicidal Ideation: No Plan Formed: No Patient has means to carry out plan: No  Homicidal Ideation: No Plan Formed: No Patient has means to carry out plan: No  Past Psychiatric History/Hospitalization(s): Patient remember taking antidepressant 20 years ago.  She has one psychiatric admission at [place] he'll at that time after taking overdose on medication.  Patient do not remember the details very well.  She denies any history of mania, psychosis, hallucination or any self abusive behavior.  She is not sure but took Celexa and trazodone in the past.  In 2001 she moved to [facility] and she has seen psychiatrist there.  She was given Wellbutrin to stop smoking for past few years.  Patient denies any history of physical, sexual, verbal learning emotional abuse. Anxiety: Yes Bipolar Disorder: No Depression: Yes Mania: No Psychosis: No Schizophrenia: No Personality Disorder: No Hospitalization for psychiatric illness: Yes History of Electroconvulsive Shock Therapy: No Prior Suicide Attempts: Yes  Family History; Patient do not recall any family history of psychiatric illness.  Medical History; Patient has multiple health issues.  She has diabetes, hypertension, history of stroke with carotid endarterectomy and memory impairment.  Her neurologist is Dr Leonie Man.  Patient also had history of rupture disc patient denies any history of seizures but endorse headaches on and off.  Her primary care physician is NP Jimmye Norman at [facility].  Traumatic brain injury: Patient denies any  history of traumatic brain injury.  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  Psychosocial History; Patient born and raised in [place].  She married once  however Catalina Antigua it ended after 18 years.  She has no children.  Her parents are deceased.  She has multiple siblings who lives in this area.  Patient lives with her sister.  Legal History; Patient denies any legal issues.  History Of Abuse; Patient denies any history of abuse.  Substance Abuse History; Patient denies any history of drinking alcohol or using any illegal substance use.  Review of Systems: Psychiatric: Agitation: Irritability Hallucination: No Depressed Mood: Yes Insomnia: Yes Hypersomnia: No Altered Concentration: No Feels Worthless: Yes Grandiose Ideas: No Belief In Special Powers: No New/Increased Substance Abuse: No Compulsions: No  Neurologic: Headache: Yes Seizure: No Paresthesias: Numbness and tickling   Outpatient Encounter Prescriptions as of 01/27/2016  Medication Sig  . ACCU-CHEK SOFTCLIX LANCETS lancets ACHS for E11.9  . amitriptyline (ELAVIL) 25 MG tablet Take 1 tablet (25 mg total) by mouth at bedtime. For depression and migraines  . amLODipine (NORVASC) 5 MG tablet TAKE ONE TABLET BY MOUTH ONCE DAILY  . aspirin 325 MG tablet Take 1 tablet (325 mg total) by mouth daily.  Marland Kitchen atorvastatin (LIPITOR) 40 MG tablet TAKE 1 TABLET BY MOUTH DAILY AT 6 PM.  . Blood Glucose Monitoring Suppl (ACCU-CHEK AVIVA PLUS) w/Device KIT ACHS for E11.9  . desloratadine (CLARINEX) 5 MG tablet Take 1 tablet (5 mg total) by mouth daily.  Marland Kitchen glucose blood (ACCU-CHEK AVIVA) test strip Check blood sugar AC and qHS for E11.9  . insulin glargine (LANTUS) 100 UNIT/ML injection Inject 0.38 mLs (38 Units total) into the skin at bedtime.  . INSULIN SYRINGE .5CC/28G 28G X 1/2" 0.5 ML MISC Give insulin once per night  . Lancet Devices (ACCU-CHEK SOFTCLIX) lancets Check blood sugar ACHS for E11.9  . levothyroxine (SYNTHROID, LEVOTHROID) 112 MCG tablet Take 112 mcg by mouth daily.  . Liraglutide (VICTOZA) 18 MG/3ML SOPN Inject 0.2 mLs (1.2 mg total) into the skin daily.  Marland Kitchen  lisinopril-hydrochlorothiazide (PRINZIDE,ZESTORETIC) 20-25 MG tablet Take 1 tablet by mouth daily.  Marland Kitchen loratadine (CLARITIN) 10 MG tablet TAKE ONE TABLET BY MOUTH ONCE DAILY  . omeprazole (PRILOSEC) 40 MG capsule TAKE 1 CAPSULE BY MOUTH ONCE DAILY.  . [DISCONTINUED] buPROPion (WELLBUTRIN SR) 150 MG 12 hr tablet TAKE TWO TABLETS BY MOUTH IN THE MORNING AND ONE AT NIGHT  . escitalopram (LEXAPRO) 20 MG tablet Take 1/2 tab daily for 1 week and than 1 tab daily  . fluticasone (FLONASE) 50 MCG/ACT nasal spray Place 2 sprays into both nostrils daily. (Patient not taking: Reported on 11/25/2015)  . GAVILYTE-N WITH FLAVOR PACK 420 g solution Take 4,000 mLs by mouth once. Reported on 01/27/2016  . levothyroxine (SYNTHROID, LEVOTHROID) 100 MCG tablet Take 1 tablet (100 mcg total) by mouth daily. (Patient not taking: Reported on 11/25/2015)  . [DISCONTINUED] Dextromethorphan-Guaifenesin (GUAIFENESIN DM) 400-20 MG TABS Take 1 tablet by mouth 2 (two) times daily. (Patient not taking: Reported on 08/14/2015)  . [DISCONTINUED] nitrofurantoin, macrocrystal-monohydrate, (MACROBID) 100 MG capsule Take 1 capsule (100 mg total) by mouth 2 (two) times daily. X 7 days (Patient not taking: Reported on 08/14/2015)   No facility-administered encounter medications on file as of 01/27/2016.    Recent Results (from the past 2160 hour(s))  POCT glucose (manual entry)  Status: Abnormal   Collection Time: 11/20/15  5:12 PM  Result Value Ref Range   POC Glucose 109 (A) 70 - 99 mg/dl  POCT glycosylated hemoglobin (Hb A1C)     Status: Abnormal   Collection Time: 11/20/15  5:12 PM  Result Value Ref Range   Hemoglobin A1C 9.2   CBC with Differential     Status: Abnormal   Collection Time: 11/25/15  6:12 AM  Result Value Ref Range   WBC 14.0 (H) 4.0 - 10.5 K/uL    Comment: WHITE COUNT CONFIRMED ON SMEAR   RBC 4.88 3.87 - 5.11 MIL/uL   Hemoglobin 13.6 12.0 - 15.0 g/dL   HCT 42.3 36.0 - 46.0 %   MCV 86.7 78.0 - 100.0 fL   MCH  27.9 26.0 - 34.0 pg   MCHC 32.2 30.0 - 36.0 g/dL   RDW 12.9 11.5 - 15.5 %   Platelets  150 - 400 K/uL    PLATELET CLUMPS NOTED ON SMEAR, COUNT APPEARS ADEQUATE   Neutrophils Relative % 64 %   Lymphocytes Relative 28 %   Monocytes Relative 6 %   Eosinophils Relative 2 %   Basophils Relative 0 %   Neutro Abs 9.0 (H) 1.7 - 7.7 K/uL   Lymphs Abs 3.9 0.7 - 4.0 K/uL   Monocytes Absolute 0.8 0.1 - 1.0 K/uL   Eosinophils Absolute 0.3 0.0 - 0.7 K/uL   Basophils Absolute 0.0 0.0 - 0.1 K/uL   Smear Review MORPHOLOGY UNREMARKABLE   Basic metabolic panel     Status: Abnormal   Collection Time: 11/25/15  6:12 AM  Result Value Ref Range   Sodium 140 135 - 145 mmol/L   Potassium 3.2 (L) 3.5 - 5.1 mmol/L   Chloride 100 (L) 101 - 111 mmol/L   CO2 26 22 - 32 mmol/L   Glucose, Bld 238 (H) 65 - 99 mg/dL   BUN 18 6 - 20 mg/dL   Creatinine, Ser 1.51 (H) 0.44 - 1.00 mg/dL   Calcium 9.6 8.9 - 10.3 mg/dL   GFR calc non Af Amer 35 (L) >60 mL/min   GFR calc Af Amer 40 (L) >60 mL/min    Comment: (NOTE) The eGFR has been calculated using the CKD EPI equation. This calculation has not been validated in all clinical situations. eGFR's persistently <60 mL/min signify possible Chronic Kidney Disease.    Anion gap 14 5 - 15  I-Stat Troponin, ED (not at [facility])     Status: None   Collection Time: 11/25/15  6:28 AM  Result Value Ref Range   Troponin i, poc 0.00 0.00 - 0.08 ng/mL   Comment 3            Comment: Due to the release kinetics of cTnI, a negative result within the first hours of the onset of symptoms does not rule out myocardial infarction with certainty. If myocardial infarction is still suspected, repeat the test at appropriate intervals.       Constitutional:  BP 132/72 mmHg  Pulse 96  Ht '5\' 2"'$  (1.575 m)  Wt 123 lb 9.6 oz (56.065 kg)  BMI 22.60 kg/m2   Musculoskeletal: Strength & Muscle Tone: decreased Gait & Station: Sometime unsteady Patient leans: N/A  Psychiatric Specialty  Exam: General Appearance: Casual and Guarded  Eye Contact::  Fair  Speech:  Slow  Volume:  Increased  Mood:  Anxious, Depressed and Irritable  Affect:  Constricted and Depressed  Thought Process:  Descriptions of Associations: Circumstantial  Orientation:  Full (Time, Place, and Person)  Thought Content:  Rumination  Suicidal Thoughts:  No  Homicidal Thoughts:  No  Memory:  Immediate;   Fair Recent;   Fair Remote;   Fair  Judgement:  Fair  Insight:  Fair  Psychomotor Activity:  Increased  Concentration:  Fair  Recall:  Poor  Fund of Knowledge:  Fair  Language:  Fair  Akathisia:  No  Handed:  Left  AIMS (if indicated):     Assets:  Communication Skills Desire for Improvement Housing  ADL's:  Intact  Cognition:  Impaired,  Mild  Sleep:        New problem, with additional work up planned, Review of Psycho-Social Stressors (1), Review or order clinical lab tests (1), Decision to obtain old records (1), Review and summation of old records (2), Established Problem, Worsening (2), New Problem, with no additional work-up planned (3), Review of Medication Regimen & Side Effects (2) and Review of New Medication or Change in Dosage (2)  Assessment: Axis I: Major depressive disorder, recurrent moderate.  Depressive disorder due to general medical condition.  Insomnia.  Axis II: Deferred  Axis III:  Past Medical History  Diagnosis Date  . Hypertension   . Thyroid disease   . Complication of anesthesia     ALLERY TO ESTER BASE  . Diabetes mellitus     INSULIN DEPENDENT  . GERD (gastroesophageal reflux disease)   . Carotid artery occlusion   . Stroke [facility]) March 2015     left MCA infarct  . Depression   . Anxiety   . Headache      Plan:  I review her symptoms, history, current medication, recent blood work results and psychosocial stressors.  Recommended to decrease Wellbutrin to 150 mg only for 1 week and then discontinued since it is not helping and may causing  irritability.  We will start Lexapro 10 mg one week daily and then 20 mg daily.  Recommended to continue amitriptyline 25 mg at bedtime which is provided by her primary care physician.  I also encouraged to see a therapist for coping and social skills.  Discussed medication side effects and benefits.  Her hemoglobin A1c is 9.2 and she admitted not able to taking care of diabetes very well.  Encouraged to watch her calorie intake, due to her exercise and take the medication as prescribed.  Discuss safety plan that anytime having active suicidal thoughts or homicidal thoughts and she need to call 911 or go to the local emergency room.  Recommended to call us back if she has any question or any worsening of the symptoms.  Follow-up in 3-4 weeks.  Angeliah Wisdom T., MD 01/27/2016

## 2016-02-04 ENCOUNTER — Encounter: Payer: Self-pay | Admitting: Vascular Surgery

## 2016-02-10 ENCOUNTER — Other Ambulatory Visit: Payer: Self-pay

## 2016-02-11 ENCOUNTER — Ambulatory Visit (HOSPITAL_COMMUNITY): Payer: Medicare Other

## 2016-02-11 ENCOUNTER — Ambulatory Visit: Payer: Medicare Other | Admitting: Vascular Surgery

## 2016-02-12 ENCOUNTER — Other Ambulatory Visit: Payer: Self-pay | Admitting: Internal Medicine

## 2016-02-18 ENCOUNTER — Ambulatory Visit (INDEPENDENT_AMBULATORY_CARE_PROVIDER_SITE_OTHER): Payer: Medicare Other | Admitting: Psychiatry

## 2016-02-18 ENCOUNTER — Encounter (HOSPITAL_COMMUNITY): Payer: Self-pay | Admitting: Psychiatry

## 2016-02-18 VITALS — BP 106/64 | HR 110 | Ht 62.0 in | Wt 121.2 lb

## 2016-02-18 DIAGNOSIS — F411 Generalized anxiety disorder: Secondary | ICD-10-CM

## 2016-02-18 DIAGNOSIS — F331 Major depressive disorder, recurrent, moderate: Secondary | ICD-10-CM | POA: Diagnosis not present

## 2016-02-18 DIAGNOSIS — F32A Depression, unspecified: Secondary | ICD-10-CM

## 2016-02-18 DIAGNOSIS — F329 Major depressive disorder, single episode, unspecified: Secondary | ICD-10-CM

## 2016-02-18 MED ORDER — ESCITALOPRAM OXALATE 20 MG PO TABS: ORAL_TABLET | ORAL | Status: DC

## 2016-02-18 MED ORDER — AMITRIPTYLINE HCL 50 MG PO TABS: 50.0000 mg | ORAL_TABLET | Freq: Every day | ORAL | Status: DC

## 2016-02-18 NOTE — Progress Notes (Signed)
[place] [id number] Progress Note  Sonia Skinner [id number] 65 y.o.  02/18/2016 4:48 PM  Chief Complaint:  I still have anxiety and poor sleep.    History of Present Illness:  Medication for her follow-up appointment.  She is 65 years old African-American divorced female who is referred from primary care physician.  She was seen first time on June 13 as initial evaluation.  She has symptoms of poor sleep, irritability, sadness, lack of motivation and difficulty concentrating.  She was taking Wellbutrin but it was not working as good.  We recommended to try Lexapro.  Today she brought her old records.  In 2007 she was seeing physician at [facility] and given Adderall, Strattera, Celexa, Zoloft .  It is unclear why these medicines were stopped.  It is also unclear if she ever had any ADD symptoms.  Patient has stroke and she has tingling, numbness and sometime she admitted her chronic pain and neuropathy does not help her sleep.  She admitted that she is more relaxed and calm with the Lexapro but her depression is still same.  She sleeping only 4-5 hours.  Patient denies any paranoia, hallucination, self abusive behavior, mania or psychosis.  She lives with her sister.  She is working as a Oceanographer at [employer].  Patient denies drinking alcohol or using any illegal substances.  Suicidal Ideation: No Plan Formed: No Patient has means to carry out plan: No  Homicidal Ideation: No Plan Formed: No Patient has means to carry out plan: No  Past Psychiatric History/Hospitalization(s): Patient remember taking antidepressant 20 years ago.  She has one psychiatric admission at [facility] time after taking overdose on medication.  She was seen at [facility] in 2017 2008 and given Strattera, Adderall, Zoloft, Celexa .  She denies any history of mania, psychosis, hallucination or any self abusive behavior.  She was never tested for ADD.  In 2001 she moved to [facility] and she has seen psychiatrist there.  She was given Wellbutrin to stop smoking for past few years.  Patient denies any history of physical, sexual, verbal learning emotional abuse. Anxiety: Yes Bipolar Disorder: No Depression: Yes Mania: No Psychosis: No Schizophrenia: No Personality Disorder: No Hospitalization for psychiatric illness: Yes History of Electroconvulsive Shock Therapy: No Prior Suicide Attempts: Yes  Family History; Patient do not recall any family history of psychiatric illness.  Medical History; Patient has multiple health issues.  She has diabetes, hypertension, history of stroke with carotid endarterectomy and memory impairment.  Her neurologist is Dr Leonie Man.  Patient also had history of rupture disc patient denies any history of seizures but endorse headaches on and off.  Her primary care physician is NP Jimmye Norman at [facility].  Psychosocial History; Patient born and raised in [place].  She married once however Catalina [place] it ended after 18 years.  She has no children.  Her parents are deceased.  She has multiple siblings who lives in this area.  Patient lives with her sister.  Review of Systems: Psychiatric: Agitation: No Hallucination: No Depressed Mood: Yes Insomnia: Yes Hypersomnia: No Altered Concentration: No Feels Worthless: Yes Grandiose Ideas: No Belief In Special Powers: No New/Increased Substance Abuse: No Compulsions: No  Neurologic: Headache: Yes Seizure: No Paresthesias: Numbness and tickling   Outpatient Encounter Prescriptions as of 02/18/2016  Medication Sig  . ACCU-CHEK SOFTCLIX LANCETS lancets ACHS for E11.9  . amitriptyline (ELAVIL) 50 MG tablet Take 1 tablet (50 mg  total) by mouth at bedtime. For depression and migraines  . amLODipine (NORVASC) 5 MG tablet TAKE ONE TABLET BY MOUTH ONCE DAILY  . aspirin 325 MG tablet Take 1 tablet (325 mg total) by mouth daily.  Marland Kitchen atorvastatin (LIPITOR) 40 MG tablet TAKE 1 TABLET BY MOUTH  DAILY AT 6 PM.  . Blood Glucose Monitoring Suppl (ACCU-CHEK AVIVA PLUS) w/Device KIT ACHS for E11.9  . desloratadine (CLARINEX) 5 MG tablet Take 1 tablet (5 mg total) by mouth daily.  Marland Kitchen escitalopram (LEXAPRO) 20 MG tablet Take 1 tab daily  . fluticasone (FLONASE) 50 MCG/ACT nasal spray Place 2 sprays into both nostrils daily. (Patient not taking: Reported on 11/25/2015)  . GAVILYTE-N WITH FLAVOR PACK 420 g solution Take 4,000 mLs by mouth once. Reported on 01/27/2016  . glucose blood (ACCU-CHEK AVIVA) test strip Check blood sugar AC and qHS for E11.9  . insulin glargine (LANTUS) 100 UNIT/ML injection Inject 0.38 mLs (38 Units total) into the skin at bedtime.  . INSULIN SYRINGE .5CC/28G 28G X 1/2" 0.5 ML MISC Give insulin once per night  . Lancet Devices (ACCU-CHEK SOFTCLIX) lancets Check blood sugar ACHS for E11.9  . levothyroxine (SYNTHROID, LEVOTHROID) 100 MCG tablet Take 1 tablet (100 mcg total) by mouth daily. (Patient not taking: Reported on 11/25/2015)  . levothyroxine (SYNTHROID, LEVOTHROID) 112 MCG tablet Take 112 mcg by mouth daily.  . Liraglutide (VICTOZA) 18 MG/3ML SOPN Inject 0.2 mLs (1.2 mg total) into the skin daily.  Marland Kitchen lisinopril-hydrochlorothiazide (PRINZIDE,ZESTORETIC) 20-25 MG tablet Take 1 tablet by mouth daily.  Marland Kitchen loratadine (CLARITIN) 10 MG tablet TAKE ONE TABLET BY MOUTH ONCE DAILY  . omeprazole (PRILOSEC) 40 MG capsule TAKE 1 CAPSULE BY MOUTH ONCE DAILY.  . [DISCONTINUED] amitriptyline (ELAVIL) 25 MG tablet Take 1 tablet (25 mg total) by mouth at bedtime. For depression and migraines  . [DISCONTINUED] escitalopram (LEXAPRO) 20 MG tablet Take 1/2 tab daily for 1 week and than 1 tab daily   No facility-administered encounter medications on file as of 02/18/2016.    Recent Results (from the past 2160 hour(s))  POCT glucose (manual entry)     Status: Abnormal   Collection Time: 11/20/15  5:12 PM  Result Value Ref Range   POC Glucose 109 (A) 70 - 99 mg/dl  POCT glycosylated  hemoglobin (Hb A1C)     Status: Abnormal   Collection Time: 11/20/15  5:12 PM  Result Value Ref Range   Hemoglobin A1C 9.2   CBC with Differential     Status: Abnormal   Collection Time: 11/25/15  6:12 AM  Result Value Ref Range   WBC 14.0 (H) 4.0 - 10.5 K/uL    Comment: WHITE COUNT CONFIRMED ON SMEAR   RBC 4.88 3.87 - 5.11 MIL/uL   Hemoglobin 13.6 12.0 - 15.0 g/dL   HCT 42.3 36.0 - 46.0 %   MCV 86.7 78.0 - 100.0 fL   MCH 27.9 26.0 - 34.0 pg   MCHC 32.2 30.0 - 36.0 g/dL   RDW 12.9 11.5 - 15.5 %   Platelets  150 - 400 K/uL    PLATELET CLUMPS NOTED ON SMEAR, COUNT APPEARS ADEQUATE   Neutrophils Relative % 64 %   Lymphocytes Relative 28 %   Monocytes Relative 6 %   Eosinophils Relative 2 %   Basophils Relative 0 %   Neutro Abs 9.0 (H) 1.7 - 7.7 K/uL   Lymphs Abs 3.9 0.7 - 4.0 K/uL   Monocytes Absolute 0.8 0.1 - 1.0 K/uL  Eosinophils Absolute 0.3 0.0 - 0.7 K/uL   Basophils Absolute 0.0 0.0 - 0.1 K/uL   Smear Review MORPHOLOGY UNREMARKABLE   Basic metabolic panel     Status: Abnormal   Collection Time: 11/25/15  6:12 AM  Result Value Ref Range   Sodium 140 135 - 145 mmol/L   Potassium 3.2 (L) 3.5 - 5.1 mmol/L   Chloride 100 (L) 101 - 111 mmol/L   CO2 26 22 - 32 mmol/L   Glucose, Bld 238 (H) 65 - 99 mg/dL   BUN 18 6 - 20 mg/dL   Creatinine, Ser 1.51 (H) 0.44 - 1.00 mg/dL   Calcium 9.6 8.9 - 10.3 mg/dL   GFR calc non Af Amer 35 (L) >60 mL/min   GFR calc Af Amer 40 (L) >60 mL/min    Comment: (NOTE) The eGFR has been calculated using the CKD EPI equation. This calculation has not been validated in all clinical situations. eGFR's persistently <60 mL/min signify possible Chronic Kidney Disease.    Anion gap 14 5 - 15  I-Stat Troponin, ED (not at [facility])     Status: None   Collection Time: 11/25/15  6:28 AM  Result Value Ref Range   Troponin i, poc 0.00 0.00 - 0.08 ng/mL   Comment 3            Comment: Due to the release kinetics of cTnI, a negative result within the first  hours of the onset of symptoms does not rule out myocardial infarction with certainty. If myocardial infarction is still suspected, repeat the test at appropriate intervals.       Constitutional:  BP 106/64 mmHg  Pulse 110  Ht '5\' 2"'$  (1.575 m)  Wt 121 lb 3.2 oz (54.976 kg)  BMI 22.16 kg/m2   Musculoskeletal: Strength & Muscle Tone: decreased Gait & Station: Sometime unsteady Patient leans: N/A  Psychiatric Specialty Exam: General Appearance: Casual and Guarded  Eye Contact::  Fair  Speech:  Slow  Volume:  Increased  Mood:  Anxious and Irritable  Affect:  Constricted and Depressed  Thought Process:  Descriptions of Associations: Circumstantial  Orientation:  Full (Time, Place, and Person)  Thought Content:  Rumination  Suicidal Thoughts:  No  Homicidal Thoughts:  No  Memory:  Immediate;   Fair Recent;   Fair Remote;   Fair  Judgement:  Fair  Insight:  Fair  Psychomotor Activity:  Increased  Concentration:  Fair  Recall:  Poor  Fund of Knowledge:  Fair  Language:  Fair  Akathisia:  No  Handed:  Left  AIMS (if indicated):     Assets:  Communication Skills Desire for Improvement Housing  ADL's:  Intact  Cognition:  Impaired,  Mild  Sleep:        Established Problem, Stable/Improving (1), Review of Psycho-Social Stressors (1), Review or order clinical lab tests (1), Decision to obtain old records (1), Review and summation of old records (2), New Problem, with no additional work-up planned (3), Review of Last Therapy Session (1), Review of Medication Regimen & Side Effects (2) and Review of New Medication or Change in Dosage (2)  Assessment: Axis I: Major depressive disorder, recurrent moderate.  Depressive disorder due to general medical condition.  Insomnia.  Axis II: Deferred  Axis III:  Past Medical History  Diagnosis Date  . Hypertension   . Thyroid disease   . Complication of anesthesia     ALLERY TO ESTER BASE  . Diabetes mellitus     INSULIN  DEPENDENT  . GERD (gastroesophageal reflux disease)   . Carotid artery occlusion   . Stroke [facility]) March 2015     left MCA infarct  . Depression   . Anxiety   . Headache      Plan:  I reviewed records from her previous provider .  Patient was seeing at [facility] in 2007 .  She was given Strattera, Adderall, Zoloft and Celexa.  She like Lexapro.  I recommended to continue Lexapro 20 mg however recommended to increase amitriptyline 50 mg to help pain, insomnia and anxiety.  Discussed medication side effects and benefits.  Patient is scheduled to see Legrand Pitts on Friday for counseling.Discuss safety plan that anytime having active suicidal thoughts or homicidal thoughts and she need to call 911 or go to the local emergency room.  Recommended to call us back if she has any question or any worsening of the symptoms.  Follow-up in 3 months.    Delvonte Berenson T., MD 02/18/2016

## 2016-02-20 ENCOUNTER — Encounter (HOSPITAL_COMMUNITY): Payer: Self-pay | Admitting: Psychology

## 2016-02-20 ENCOUNTER — Ambulatory Visit (INDEPENDENT_AMBULATORY_CARE_PROVIDER_SITE_OTHER): Payer: Medicare Other | Admitting: Psychology

## 2016-02-20 DIAGNOSIS — F331 Major depressive disorder, recurrent, moderate: Secondary | ICD-10-CM

## 2016-02-20 DIAGNOSIS — F339 Major depressive disorder, recurrent, unspecified: Secondary | ICD-10-CM | POA: Insufficient documentation

## 2016-02-20 NOTE — Progress Notes (Signed)
Comprehensive Clinical Assessment (CCA) Note  02/20/2016 SHAUNIE BEATTY [id number]  Visit Diagnosis:      ICD-9-CM ICD-10-CM   1. Moderate episode of recurrent major depressive disorder (HCC) 296.32 F33.1       CCA Part One  Part One has been completed on paper by the patient.  (See scanned document in Chart Review)  CCA Part Two A  Intake/Chief Complaint:  CCA Intake With Chief Complaint CCA Part Two Date: 02/20/16 CCA Part Two Time: 0900 Chief Complaint/Presenting Problem: Pt is referred by Dr. Adele Schilder for counseling due to MDD, recurrent.  pt was referred to Dr. Adele Schilder by PCP due to depressive symptoms.  pt reported that she suffered with depressive episode in her early 30s.  pt reported that she noticing depressive symptoms this past year or so and in 2017 began worsening to point she felt that  she was losing control.  pt reported feeling depressed mood, increased irritability, feeling tired, difficulty w/ sleep, low motivation and energy as well as poor concentration.  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   Patients Currently Reported Symptoms/Problems: pt reports some improvment since being out for the summer- but still a lot of stressor w/ her family and helping out w/ her family.  Pt reports she is the person that everyone comes to for help as she "will do anything for anyone".  pt acknowledges that this can cause her stress and that she needs to decide whether can cope with that or need to make a change.  Pt reported that she is also dealing w/ severe cramps in leg and arms and will f/u w/  her PCP today about that.  Collateral Involvement: Dr. Marguerite Olea records Individual's Strengths: support of her sisters and involved w/ her church Individual's Preferences: medication management.  pt wants to consider her wants for counseling and goals for counseling till next session in 3 weeks.   Type  of Services Patient Feels Are Needed: medication managment and counseling.   Mental Health Symptoms Depression:  Depression: Change in energy/activity, Difficulty Concentrating, Fatigue, Irritability, Sleep (too much or little)  Mania:  Mania: N/A  Anxiety:   Anxiety: Worrying, Irritability  Psychosis:     Trauma:  Trauma: N/A  Obsessions:  Obsessions: N/A  Compulsions:  Compulsions: N/A  Inattention:  Inattention: N/A  Hyperactivity/Impulsivity:  Hyperactivity/Impulsivity: N/A  Oppositional/Defiant Behaviors:  Oppositional/Defiant Behaviors: N/A  Borderline Personality:  Emotional Irregularity: N/A  Other Mood/Personality Symptoms:      Mental Status Exam Appearance and self-care  Stature:  Stature: Average  Weight:  Weight: Average weight  Clothing:  Clothing: Neat/clean  Grooming:  Grooming: Normal  Cosmetic use:  Cosmetic Use: Age appropriate  Posture/gait:  Posture/Gait: Normal  Motor activity:  Motor Activity: Not Remarkable  Sensorium  Attention:  Attention: Normal  Concentration:  Concentration: Normal  Orientation:  Orientation: X5  Recall/memory:  Recall/Memory: Normal  Affect and Mood  Affect:  Affect: Appropriate  Mood:  Mood: Depressed, Anxious  Relating  Eye contact:  Eye Contact: Normal  Facial expression:  Facial Expression: Responsive  Attitude toward examiner:  Attitude Toward Examiner: Cooperative  Thought and Language  Speech flow: Speech Flow: Normal  Thought content:  Thought Content: Appropriate to mood and circumstances  Preoccupation:     Hallucinations:     Organization:     Transport planner of Knowledge:     Intelligence:  Intelligence:  Average  Abstraction:  Abstraction: Normal  Judgement:  Judgement: Normal  Reality Testing:  Reality Testing: Adequate  Insight:  Insight: Good  Decision Making:  Decision Making: Normal  Social Functioning  Social Maturity:  Social Maturity: Responsible  Social Judgement:  Social Judgement:  Normal  Stress  Stressors:  Stressors: Family conflict, Work  Coping Ability:  Coping Ability: Overwhelmed, Deficient supports  Skill Deficits:     Supports:      Family and Psychosocial History: Family history Marital status: Separated Separated, when?: pt and husband separated in 1993.  They married in 1976- they have not divorced.   Are you sexually active?: No Does patient have children?: No  Childhood History:  Childhood History By whom was/is the patient raised?: Both parents Additional childhood history information: pt was born and grew up in [place], [place].   Patient's description of current relationship with people who raised him/her: both parents deceased Does patient have siblings?: Yes Number of Siblings: 6 Description of patient's current relationship with siblings: Sister age 57, Sister age almost 40, Twin brother and sister age 70, brother age 54 and sister age 29.   pt lives w/ her sister who is 10 y/o and reports she can be controlling at times.    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  Plans on PartTime moving forward.  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   Leisure/Recreation: Leisure / Recreation Leisure and Hobbies: church involvement, getting together w/ siblings.   Exercise/Diet: Exercise/Diet Do You Exercise?: No Have You Gained or Lost A Significant Amount of Weight in the Past Six Months?: No Do You Follow a Special Diet?: Yes Type of Diet: diabetic Do You Have Any Trouble Sleeping?: Yes  CCA Part Two C  Alcohol/Drug Use: Alcohol / Drug Use History of alcohol / drug use?: No history of alcohol / drug abuse                      CCA Part Three  ASAM's:  Six Dimensions of Multidimensional Assessment  Dimension 1:  Acute Intoxication and/or Withdrawal Potential:     Dimension 2:  Biomedical Conditions and Complications:     Dimension 3:  Emotional, Behavioral, or Cognitive Conditions and Complications:     Dimension 4:  Readiness to Change:     Dimension 5:  Relapse, Continued use, or Continued Problem Potential:     Dimension 6:  Recovery/Living Environment:      Substance use Disorder (SUD)    Social Function:  Social Functioning Social Maturity: Responsible Social Judgement: Normal  Stress:  Stress Stressors: Family conflict, Work Coping Ability: Overwhelmed, Deficient supports Patient Takes Medications The Way The Doctor Instructed?: Yes Priority Risk: Low Acuity  Risk Assessment- Self-Harm Potential: Risk Assessment For  Self-Harm Potential Thoughts of Self-Harm: No current thoughts Method: No plan  Risk Assessment -Dangerous to Others Potential: Risk Assessment For Dangerous to Others Potential Method: No Plan  DSM5 Diagnoses: Patient Active Problem List   Diagnosis Date Noted  . Major depressive disorder, recurrent episode ([place]) 02/20/2016  . Keloid scar-Left neck post-op site 12/19/2013  . Tightness of neck-Left 12/19/2013  . Pain in limb-Left neck 12/19/2013  . CVA (cerebral infarction) 11/25/2013  . Occlusion and stenosis of carotid artery with cerebral infarction 11/25/2013  . Left sided numbness 11/25/2013  . Depression 11/25/2013  . Aftercare following surgery of the circulatory system, [place] 11/21/2013  . Occlusion and stenosis of carotid artery without mention of cerebral infarction- Left 11/21/2013  . Follow-up examination, following unspecified surgery 11/21/2013  . Carotid stenosis 11/03/2013  . Parotid mass 11/02/2013  . Acute ischemic left MCA stroke ([place]) 11/02/2013  . Type II or unspecified type diabetes mellitus with neurological manifestations, not stated as uncontrolled 11/02/2013  . CVA (cerebral vascular accident) ([place]) 11/01/2013  . TIA (transient ischemic attack) 11/01/2013  . Unspecified vitamin D deficiency 10/19/2013  . Tooth pain 07/31/2013  . Tobacco abuse 07/31/2013  . Essential hypertension, benign 04/20/2013  . DM type 2 (diabetes mellitus, type 2) ([place]) 04/20/2013  . Hypothyroidism 04/20/2013  . Other and unspecified hyperlipidemia 04/20/2013    Patient Centered Plan: Patient is on the following Treatment Plan(s):  Pt to come to next session w/ goals for counseling.  Recommendations for Services/Supports/Treatments: Recommendations for Services/Supports/Treatments Recommendations For Services/Supports/Treatments: Individual Therapy, Medication Management  Treatment Plan Summary:   F/u in 3 weeks for counseling.  Pt wants to consider whether ready to  continue w/ counseling and what she wants are from counseling.  Pt to continue medication management w/ Dr. Marchia Bond.   Pt to f/u today w/ PCP re: pain and cramping in legs.   Jan Fireman

## 2016-02-26 ENCOUNTER — Other Ambulatory Visit: Payer: Self-pay | Admitting: Internal Medicine

## 2016-03-07 ENCOUNTER — Inpatient Hospital Stay (HOSPITAL_COMMUNITY)
Admission: EM | Admit: 2016-03-07 | Discharge: 2016-03-11 | DRG: 638 | Disposition: A | Discharge status: Home / Self Care | Payer: Medicare Other | Attending: Internal Medicine | Admitting: Internal Medicine

## 2016-03-07 ENCOUNTER — Encounter (HOSPITAL_COMMUNITY): Payer: Self-pay

## 2016-03-07 DIAGNOSIS — I6789 Other cerebrovascular disease: Secondary | ICD-10-CM | POA: Diagnosis not present

## 2016-03-07 DIAGNOSIS — N183 Chronic kidney disease, stage 3 (moderate): Secondary | ICD-10-CM | POA: Diagnosis present

## 2016-03-07 DIAGNOSIS — Z91148 Patient's other noncompliance with medication regimen for other reason: Secondary | ICD-10-CM

## 2016-03-07 DIAGNOSIS — Z833 Family history of diabetes mellitus: Secondary | ICD-10-CM

## 2016-03-07 DIAGNOSIS — E039 Hypothyroidism, unspecified: Secondary | ICD-10-CM | POA: Diagnosis present

## 2016-03-07 DIAGNOSIS — E871 Hypo-osmolality and hyponatremia: Secondary | ICD-10-CM | POA: Diagnosis present

## 2016-03-07 DIAGNOSIS — Z7982 Long term (current) use of aspirin: Secondary | ICD-10-CM

## 2016-03-07 DIAGNOSIS — F329 Major depressive disorder, single episode, unspecified: Secondary | ICD-10-CM | POA: Diagnosis present

## 2016-03-07 DIAGNOSIS — E1122 Type 2 diabetes mellitus with diabetic chronic kidney disease: Secondary | ICD-10-CM | POA: Diagnosis present

## 2016-03-07 DIAGNOSIS — Z8673 Personal history of transient ischemic attack (TIA), and cerebral infarction without residual deficits: Secondary | ICD-10-CM

## 2016-03-07 DIAGNOSIS — F419 Anxiety disorder, unspecified: Secondary | ICD-10-CM | POA: Diagnosis present

## 2016-03-07 DIAGNOSIS — E118 Type 2 diabetes mellitus with unspecified complications: Secondary | ICD-10-CM

## 2016-03-07 DIAGNOSIS — Z9119 Patient's noncompliance with other medical treatment and regimen: Secondary | ICD-10-CM

## 2016-03-07 DIAGNOSIS — Z9114 Patient's other noncompliance with medication regimen: Secondary | ICD-10-CM

## 2016-03-07 DIAGNOSIS — K219 Gastro-esophageal reflux disease without esophagitis: Secondary | ICD-10-CM | POA: Diagnosis present

## 2016-03-07 DIAGNOSIS — Z720 Tobacco use: Secondary | ICD-10-CM | POA: Diagnosis present

## 2016-03-07 DIAGNOSIS — Z79899 Other long term (current) drug therapy: Secondary | ICD-10-CM

## 2016-03-07 DIAGNOSIS — E860 Dehydration: Secondary | ICD-10-CM | POA: Diagnosis present

## 2016-03-07 DIAGNOSIS — E1165 Type 2 diabetes mellitus with hyperglycemia: Secondary | ICD-10-CM | POA: Diagnosis not present

## 2016-03-07 DIAGNOSIS — E876 Hypokalemia: Secondary | ICD-10-CM | POA: Diagnosis present

## 2016-03-07 DIAGNOSIS — R739 Hyperglycemia, unspecified: Secondary | ICD-10-CM | POA: Diagnosis present

## 2016-03-07 DIAGNOSIS — F32A Depression, unspecified: Secondary | ICD-10-CM | POA: Diagnosis present

## 2016-03-07 DIAGNOSIS — E1100 Type 2 diabetes mellitus with hyperosmolarity without nonketotic hyperglycemic-hyperosmolar coma (NKHHC): Secondary | ICD-10-CM | POA: Diagnosis present

## 2016-03-07 DIAGNOSIS — Z22322 Carrier or suspected carrier of Methicillin resistant Staphylococcus aureus: Secondary | ICD-10-CM

## 2016-03-07 DIAGNOSIS — N179 Acute kidney failure, unspecified: Secondary | ICD-10-CM | POA: Diagnosis not present

## 2016-03-07 DIAGNOSIS — R251 Tremor, unspecified: Secondary | ICD-10-CM | POA: Diagnosis not present

## 2016-03-07 DIAGNOSIS — I10 Essential (primary) hypertension: Secondary | ICD-10-CM | POA: Diagnosis present

## 2016-03-07 DIAGNOSIS — IMO0002 Reserved for concepts with insufficient information to code with codable children: Secondary | ICD-10-CM | POA: Diagnosis present

## 2016-03-07 DIAGNOSIS — Z794 Long term (current) use of insulin: Secondary | ICD-10-CM

## 2016-03-07 DIAGNOSIS — E785 Hyperlipidemia, unspecified: Secondary | ICD-10-CM | POA: Diagnosis present

## 2016-03-07 DIAGNOSIS — Z981 Arthrodesis status: Secondary | ICD-10-CM

## 2016-03-07 DIAGNOSIS — I129 Hypertensive chronic kidney disease with stage 1 through stage 4 chronic kidney disease, or unspecified chronic kidney disease: Secondary | ICD-10-CM | POA: Diagnosis present

## 2016-03-07 LAB — COMPREHENSIVE METABOLIC PANEL
ALT: 13 U/L — ABNORMAL LOW (ref 14–54)
AST: 11 U/L — ABNORMAL LOW (ref 15–41)
Albumin: 3.7 g/dL (ref 3.5–5.0)
Alkaline Phosphatase: 182 U/L — ABNORMAL HIGH (ref 38–126)
Anion gap: 14 (ref 5–15)
BUN: 39 mg/dL — ABNORMAL HIGH (ref 6–20)
CO2: 24 mmol/L (ref 22–32)
Calcium: 9.1 mg/dL (ref 8.9–10.3)
Chloride: 83 mmol/L — ABNORMAL LOW (ref 101–111)
Creatinine, Ser: 2.38 mg/dL — ABNORMAL HIGH (ref 0.44–1.00)
GFR calc Af Amer: 23 mL/min — ABNORMAL LOW (ref >60)
GFR calc non Af Amer: 20 mL/min — ABNORMAL LOW (ref >60)
Glucose, Bld: 1003 mg/dL (ref 65–99)
Potassium: 4.9 mmol/L (ref 3.5–5.1)
Sodium: 121 mmol/L — ABNORMAL LOW (ref 135–145)
Total Bilirubin: 0.5 mg/dL (ref 0.3–1.2)
Total Protein: 7.3 g/dL (ref 6.5–8.1)

## 2016-03-07 LAB — I-STAT ARTERIAL BLOOD GAS, ED
Acid-base deficit: 1 mmol/L (ref 0.0–2.0)
Bicarbonate: 24.1 meq/L — ABNORMAL HIGH (ref 20.0–24.0)
O2 Saturation: 94 %
Patient temperature: 98.6
TCO2: 25 mmol/L (ref 0–100)
pCO2 arterial: 41.3 mmHg (ref 35.0–45.0)
pH, Arterial: 7.374 (ref 7.350–7.450)
pO2, Arterial: 73 mmHg — ABNORMAL LOW (ref 80.0–100.0)

## 2016-03-07 LAB — I-STAT CHEM 8, ED
BUN: 50 mg/dL — ABNORMAL HIGH (ref 6–20)
Calcium, Ion: 1.05 mmol/L — ABNORMAL LOW (ref 1.12–1.23)
Chloride: 85 mmol/L — ABNORMAL LOW (ref 101–111)
Creatinine, Ser: 2.2 mg/dL — ABNORMAL HIGH (ref 0.44–1.00)
Glucose, Bld: >700 mg/dL (ref 65–99)
HCT: 40 % (ref 36.0–46.0)
Hemoglobin: 13.6 g/dL (ref 12.0–15.0)
Potassium: 5.1 mmol/L (ref 3.5–5.1)
Sodium: 121 mmol/L — ABNORMAL LOW (ref 135–145)
TCO2: 27 mmol/L (ref 0–100)

## 2016-03-07 LAB — CBG MONITORING, ED: Glucose-Capillary: >600 mg/dL (ref 65–99)

## 2016-03-07 MED ORDER — SODIUM CHLORIDE 0.9 % IV BOLUS (SEPSIS)
1000.0000 mL | Freq: Once | INTRAVENOUS | Status: AC
  Administered 2016-03-07: 1000 mL via INTRAVENOUS

## 2016-03-07 MED ORDER — SODIUM CHLORIDE 0.9 % IV BOLUS (SEPSIS)
1000.0000 mL | Freq: Once | INTRAVENOUS | Status: AC
  Administered 2016-03-08: 1000 mL via INTRAVENOUS

## 2016-03-07 MED ORDER — SODIUM CHLORIDE 0.9 % IV SOLN
INTRAVENOUS | Status: DC
  Administered 2016-03-07: 5.4 [IU]/h via INTRAVENOUS
  Filled 2016-03-07: qty 2.5

## 2016-03-07 MED ORDER — INSULIN ASPART 100 UNIT/ML ~~LOC~~ SOLN
15.0000 [IU] | Freq: Once | SUBCUTANEOUS | Status: AC
  Administered 2016-03-07: 15 [IU] via INTRAVENOUS
  Filled 2016-03-07: qty 1

## 2016-03-07 NOTE — ED Notes (Signed)
Lab at bedside at this time.  

## 2016-03-07 NOTE — ED Notes (Signed)
PA at bedside.

## 2016-03-07 NOTE — ED Notes (Signed)
notifed PA critical results

## 2016-03-07 NOTE — ED Provider Notes (Signed)
[facility] DEPT Provider Note   CSN: [id number] Arrival date & time: 03/07/16  2032  First Provider Contact:  First MD Initiated Contact with Patient 03/07/16 2200        History   Chief Complaint Chief Complaint  Patient presents with  . Hyperglycemia    HPI LUCCIANA HEAD is a 65 y.o. female.  65 year old female with a history of hypertension, thyroid disease, diabetes mellitus, left MCA infarct, and depression presents to the emergency department for evaluation of hyperglycemia. Patient states that she discontinued her medications 2 days ago. She states that she was "tired of the routine". Patient awoke this morning feeling unwell. She has had progressive polyuria and polydipsia as well as some mild nausea without emesis. She has also noted involuntary jerking of her left arm. This will stop when she holds it with her right hand. Patient denies any recent upper respiratory infection or cough. She further denies fevers, syncope, chest pain, shortness of breath, and abdominal pain. She does report that she has been experiencing difficulty with her "legs giving out"; however, this has been an ongoing issue prior to onset of her hyperglycemia. She denies any suicidal ideations associated with discontinuation of her medications, but does state that she has been feeling more depressed as of late.    Past Medical History:  Diagnosis Date  . Anxiety   . Carotid artery occlusion   . Complication of anesthesia    ALLERY TO ESTER BASE  . Depression    early 12s  . Diabetes mellitus    INSULIN DEPENDENT  . GERD (gastroesophageal reflux disease)   . Headache   . Hypertension   . Stroke [facility]) March 2015    left MCA infarct  . Thyroid disease     Patient Active Problem List   Diagnosis Date Noted  . Hyperglycemia 03/07/2016  . Major depressive disorder, recurrent episode ([place]) 02/20/2016  . Keloid scar-Left neck post-op site 12/19/2013  . Tightness of neck-Left 12/19/2013  . Pain  in limb-Left neck 12/19/2013  . CVA (cerebral infarction) 11/25/2013  . Occlusion and stenosis of carotid artery with cerebral infarction 11/25/2013  . Left sided numbness 11/25/2013  . Depression 11/25/2013  . Aftercare following surgery of the circulatory system, [place] 11/21/2013  . Occlusion and stenosis of carotid artery without mention of cerebral infarction- Left 11/21/2013  . Follow-up examination, following unspecified surgery 11/21/2013  . Carotid stenosis 11/03/2013  . Parotid mass 11/02/2013  . Acute ischemic left MCA stroke ([place]) 11/02/2013  . Type II or unspecified type diabetes mellitus with neurological manifestations, not stated as uncontrolled 11/02/2013  . CVA (cerebral vascular accident) ([place]) 11/01/2013  . TIA (transient ischemic attack) 11/01/2013  . Unspecified vitamin D deficiency 10/19/2013  . Tooth pain 07/31/2013  . Tobacco abuse 07/31/2013  . Essential hypertension, benign 04/20/2013  . DM type 2 (diabetes mellitus, type 2) ([place]) 04/20/2013  . Hypothyroidism 04/20/2013  . Other and unspecified hyperlipidemia 04/20/2013    Past Surgical History:  Procedure Laterality Date  . ABDOMINAL HYSTERECTOMY    . ANTERIOR FIXATION AND POSTERIOR MICRODISCECTOMY CERVICAL SPINE  1999  . CAROTID ENDARTERECTOMY Left 11-04-13   cea  . ENDARTERECTOMY Left 11/04/2013    OB History    No data available       Home Medications    Prior to Admission medications   Medication Sig Start Date End Date Taking? Authorizing Provider  ACCU-CHEK SOFTCLIX LANCETS lancets ACHS for E11.9 03/25/15   Lance Bosch, NP  amitriptyline (ELAVIL) 50 MG tablet Take 1 tablet (50 mg total) by mouth at bedtime. For depression and migraines 02/18/16   Kathlee Nations, MD  amLODipine (NORVASC) 5 MG tablet TAKE ONE TABLET BY MOUTH ONCE DAILY 01/19/16   Tresa Garter, MD  aspirin 325 MG tablet Take 1 tablet (325 mg total) by mouth daily. 11/06/13   Samella Parr, NP  atorvastatin (LIPITOR) 40 MG  tablet TAKE 1 TABLET BY MOUTH DAILY AT 6 PM. 08/14/15   Lance Bosch, NP  Blood Glucose Monitoring Suppl (ACCU-CHEK AVIVA PLUS) w/Device KIT ACHS for E11.9 08/14/15   Lance Bosch, NP  desloratadine (CLARINEX) 5 MG tablet Take 1 tablet (5 mg total) by mouth daily. 04/16/15   April Palumbo, MD  escitalopram (LEXAPRO) 20 MG tablet Take 1 tab daily 02/18/16   Kathlee Nations, MD  fluticasone (FLONASE) 50 MCG/ACT nasal spray Place 2 sprays into both nostrils daily. Patient not taking: Reported on 11/25/2015 04/16/15   April Palumbo, MD  GAVILYTE-N WITH FLAVOR PACK 420 g solution Take 4,000 mLs by mouth once. Reported on 01/27/2016 11/24/15   Historical Provider, MD  glucose blood (ACCU-CHEK AVIVA) test strip Check blood sugar AC and qHS for E11.9 01/23/16   Tresa Garter, MD  insulin glargine (LANTUS) 100 UNIT/ML injection Inject 0.38 mLs (38 Units total) into the skin at bedtime. 08/14/15   Lance Bosch, NP  INSULIN SYRINGE .5CC/28G 28G X 1/2" 0.5 ML MISC Give insulin once per night 08/14/15   Lance Bosch, NP  Lancet Devices [facility]) lancets Check blood sugar ACHS for E11.9 08/14/15   Lance Bosch, NP  levothyroxine (SYNTHROID, LEVOTHROID) 100 MCG tablet Take 1 tablet (100 mcg total) by mouth daily. Patient not taking: Reported on 11/25/2015 08/06/15   Lance Bosch, NP  levothyroxine (SYNTHROID, LEVOTHROID) 112 MCG tablet Take 112 mcg by mouth daily. 08/19/15   Historical Provider, MD  Liraglutide (VICTOZA) 18 MG/3ML SOPN Inject 0.2 mLs (1.2 mg total) into the skin daily. 12/05/15   Tresa Garter, MD  lisinopril-hydrochlorothiazide (PRINZIDE,ZESTORETIC) 20-25 MG tablet Take 1 tablet by mouth daily. Needs office visit for refills 02/26/16   Tresa Garter, MD  loratadine (CLARITIN) 10 MG tablet TAKE ONE TABLET BY MOUTH ONCE DAILY 01/19/16   Tresa Garter, MD  omeprazole (PRILOSEC) 40 MG capsule TAKE ONE CAPSULE BY MOUTH ONCE DAILY 02/26/16   Tresa Garter, MD     Family History Family History  Problem Relation Age of Onset  . Diabetes Mother   . Heart disease Mother     Before age 1  . Cancer Father     Lung  . Hypertension Sister   . Diabetes Sister   . Diabetes Sister   . Diabetes Sister     Social History Social History  Substance Use Topics  . Smoking status: Former Smoker    Packs/day: 0.20    Years: 20.00    Types: Cigarettes    Quit date: 11/01/2013  . Smokeless tobacco: Never Used  . Alcohol use No     Allergies   Anesthetics, ester   Review of Systems Review of Systems  Constitutional: Positive for fatigue. Negative for fever.  Respiratory: Negative for shortness of breath.   Cardiovascular: Negative for chest pain.  Gastrointestinal: Positive for nausea. Negative for vomiting.  Endocrine: Positive for polydipsia and polyuria.  Genitourinary: Negative for dysuria.  Neurological: Negative for syncope.  Ten systems reviewed and are negative  for acute change, except as noted in the HPI.    Physical Exam Updated Vital Signs BP (!) 122/41   Pulse 94   Temp 98.3 F (36.8 C) (Oral)   Resp 16   Ht _0  (1.575 m)   Wt 56.7 kg   SpO2 100%   BMI 22.86 kg/m   Physical Exam  Constitutional: She is oriented to person, place, and time. She appears well-developed and well-nourished. No distress.  Nontoxic appearing; in no distress  HENT:  Head: Normocephalic and atraumatic.  Dry mucous membranes. Mild chelitis.  Eyes: Conjunctivae and EOM are normal. No scleral icterus.  Neck: Normal range of motion.  Cardiovascular: Normal rate, regular rhythm and normal heart sounds.   Pulmonary/Chest: Effort normal. No respiratory distress. She has no wheezes.  Respirations even and unlabored. Chest expansion symmetric.  Abdominal: Soft. She exhibits no distension. There is no tenderness. There is no guarding.  Musculoskeletal: Normal range of motion.  Neurological: She is alert and oriented to person, place, and time.   GCS 15. Sensation to light touch intact in all extremities. Normal grips b/l; 5/5 strength against resistance in all major muscle groups b/l. Involuntary rhythmic jerking noted to the LUE at the elbow.  Skin: Skin is warm and dry. No rash noted. She is not diaphoretic. No erythema. No pallor.  Psychiatric: She has a normal mood and affect. Her behavior is normal.  Nursing note and vitals reviewed.    ED Treatments / Results  Labs (all labs ordered are listed, but only abnormal results are displayed) Labs Reviewed  COMPREHENSIVE METABOLIC PANEL - Abnormal; Notable for the following:       Result Value   Sodium 121 (*)    Chloride 83 (*)    Glucose, Bld 1,003 (*)    BUN 39 (*)    Creatinine, Ser 2.38 (*)    AST 11 (*)    ALT 13 (*)    Alkaline Phosphatase 182 (*)    GFR calc non Af Amer 20 (*)    GFR calc Af Amer 23 (*)    All other components within normal limits  CBG MONITORING, ED - Abnormal; Notable for the following:    Glucose-Capillary >600 (*)    All other components within normal limits  I-STAT CHEM 8, ED - Abnormal; Notable for the following:    Sodium 121 (*)    Chloride 85 (*)    BUN 50 (*)    Creatinine, Ser 2.20 (*)    Glucose, Bld >700 (*)    Calcium, Ion 1.05 (*)    All other components within normal limits  I-STAT ARTERIAL BLOOD GAS, ED - Abnormal; Notable for the following:    pO2, Arterial 73.0 (*)    Bicarbonate 24.1 (*)    All other components within normal limits  CBC WITH DIFFERENTIAL/PLATELET  URINALYSIS, ROUTINE W REFLEX MICROSCOPIC (NOT AT [facility])  CBG MONITORING, ED    EKG  EKG Interpretation None       Radiology No results found.  Procedures Procedures (including critical care time)  Medications Ordered in ED Medications  insulin regular (NOVOLIN R,HUMULIN R) 250 Units in sodium chloride 0.9 % 250 mL (1 Units/mL) infusion (5.4 Units/hr Intravenous New Bag/Given 03/07/16 2313)  sodium chloride 0.9 % bolus 1,000 mL (not administered)   sodium chloride 0.9 % bolus 1,000 mL (1,000 mLs Intravenous New Bag/Given 03/07/16 2200)  insulin aspart (novoLOG) injection 15 Units (15 Units Intravenous Given 03/07/16 2249)    CRITICAL  CARE Performed by: Antonietta Breach   Total critical care time: 35 minutes  Critical care time was exclusive of separately billable procedures and treating other patients.  Critical care was necessary to treat or prevent imminent or life-threatening deterioration.  Critical care was time spent personally by me on the following activities: development of treatment plan with patient and/or surrogate as well as nursing, discussions with consultants, evaluation of patient's response to treatment, examination of patient, obtaining history from patient or surrogate, ordering and performing treatments and interventions, ordering and review of laboratory studies, ordering and review of radiographic studies, pulse oximetry and re-evaluation of patient's condition.   Initial Impression / Assessment and Plan / ED Course  I have reviewed the triage vital signs and the nursing notes.  Pertinent labs & imaging results that were available during my care of the patient were reviewed by me and considered in my medical decision making (see chart for details).  Clinical Course    65 year old female presents to the emergency department for hyperglycemia in the setting of medication noncompliance. CBG just over 1000. No evidence of ketoacidosis. CBC is pending as labs reports that the initial sample clotted. Urinalysis is also pending. Patient does appear to have acute kidney injury, likely from volume depletion/dehydration associated with her hyperglycemia. Case discussed with Dr. Tamala Julian of [facility] who will admit for further management.   Final Clinical Impressions(s) / ED Diagnoses   Final diagnoses:  Hyperglycemia without ketosis  AKI (acute kidney injury) ([place])  Hx of medication noncompliance    New Prescriptions New  Prescriptions   No medications on file     Antonietta Breach, PA-C 03/07/16 2353    Gareth Morgan, MD 03/08/16 1217

## 2016-03-08 DIAGNOSIS — I129 Hypertensive chronic kidney disease with stage 1 through stage 4 chronic kidney disease, or unspecified chronic kidney disease: Secondary | ICD-10-CM | POA: Diagnosis present

## 2016-03-08 DIAGNOSIS — E1122 Type 2 diabetes mellitus with diabetic chronic kidney disease: Secondary | ICD-10-CM | POA: Diagnosis present

## 2016-03-08 DIAGNOSIS — N179 Acute kidney failure, unspecified: Secondary | ICD-10-CM | POA: Diagnosis not present

## 2016-03-08 DIAGNOSIS — E039 Hypothyroidism, unspecified: Secondary | ICD-10-CM | POA: Diagnosis present

## 2016-03-08 DIAGNOSIS — N183 Chronic kidney disease, stage 3 (moderate): Secondary | ICD-10-CM | POA: Diagnosis present

## 2016-03-08 DIAGNOSIS — E1165 Type 2 diabetes mellitus with hyperglycemia: Secondary | ICD-10-CM | POA: Diagnosis present

## 2016-03-08 DIAGNOSIS — Z22322 Carrier or suspected carrier of Methicillin resistant Staphylococcus aureus: Secondary | ICD-10-CM | POA: Diagnosis not present

## 2016-03-08 DIAGNOSIS — Z8673 Personal history of transient ischemic attack (TIA), and cerebral infarction without residual deficits: Secondary | ICD-10-CM | POA: Diagnosis not present

## 2016-03-08 DIAGNOSIS — F419 Anxiety disorder, unspecified: Secondary | ICD-10-CM | POA: Diagnosis present

## 2016-03-08 DIAGNOSIS — Z79899 Other long term (current) drug therapy: Secondary | ICD-10-CM | POA: Diagnosis not present

## 2016-03-08 DIAGNOSIS — Z794 Long term (current) use of insulin: Secondary | ICD-10-CM | POA: Diagnosis not present

## 2016-03-08 DIAGNOSIS — E785 Hyperlipidemia, unspecified: Secondary | ICD-10-CM | POA: Diagnosis present

## 2016-03-08 DIAGNOSIS — K219 Gastro-esophageal reflux disease without esophagitis: Secondary | ICD-10-CM

## 2016-03-08 DIAGNOSIS — Z9114 Patient's other noncompliance with medication regimen: Secondary | ICD-10-CM

## 2016-03-08 DIAGNOSIS — I10 Essential (primary) hypertension: Secondary | ICD-10-CM | POA: Diagnosis not present

## 2016-03-08 DIAGNOSIS — Z981 Arthrodesis status: Secondary | ICD-10-CM | POA: Diagnosis not present

## 2016-03-08 DIAGNOSIS — F329 Major depressive disorder, single episode, unspecified: Secondary | ICD-10-CM | POA: Diagnosis not present

## 2016-03-08 DIAGNOSIS — Z91148 Patient's other noncompliance with medication regimen for other reason: Secondary | ICD-10-CM

## 2016-03-08 DIAGNOSIS — R739 Hyperglycemia, unspecified: Secondary | ICD-10-CM | POA: Diagnosis not present

## 2016-03-08 DIAGNOSIS — Z7982 Long term (current) use of aspirin: Secondary | ICD-10-CM | POA: Diagnosis not present

## 2016-03-08 DIAGNOSIS — E871 Hypo-osmolality and hyponatremia: Secondary | ICD-10-CM | POA: Diagnosis present

## 2016-03-08 DIAGNOSIS — E1101 Type 2 diabetes mellitus with hyperosmolarity with coma: Secondary | ICD-10-CM | POA: Diagnosis not present

## 2016-03-08 DIAGNOSIS — E032 Hypothyroidism due to medicaments and other exogenous substances: Secondary | ICD-10-CM | POA: Diagnosis not present

## 2016-03-08 DIAGNOSIS — E1100 Type 2 diabetes mellitus with hyperosmolarity without nonketotic hyperglycemic-hyperosmolar coma (NKHHC): Secondary | ICD-10-CM | POA: Diagnosis present

## 2016-03-08 DIAGNOSIS — Z9119 Patient's noncompliance with other medical treatment and regimen: Secondary | ICD-10-CM

## 2016-03-08 DIAGNOSIS — Z720 Tobacco use: Secondary | ICD-10-CM

## 2016-03-08 DIAGNOSIS — E876 Hypokalemia: Secondary | ICD-10-CM | POA: Diagnosis present

## 2016-03-08 DIAGNOSIS — E860 Dehydration: Secondary | ICD-10-CM | POA: Diagnosis present

## 2016-03-08 DIAGNOSIS — Z833 Family history of diabetes mellitus: Secondary | ICD-10-CM | POA: Diagnosis not present

## 2016-03-08 LAB — BASIC METABOLIC PANEL
Anion gap: 9 (ref 5–15)
Anion gap: 6 (ref 5–15)
BUN: 32 mg/dL — ABNORMAL HIGH (ref 6–20)
BUN: 25 mg/dL — ABNORMAL HIGH (ref 6–20)
CO2: 25 mmol/L (ref 22–32)
CO2: 24 mmol/L (ref 22–32)
Calcium: 9.1 mg/dL (ref 8.9–10.3)
Calcium: 8.4 mg/dL — ABNORMAL LOW (ref 8.9–10.3)
Chloride: 100 mmol/L — ABNORMAL LOW (ref 101–111)
Chloride: 106 mmol/L (ref 101–111)
Creatinine, Ser: 1.82 mg/dL — ABNORMAL HIGH (ref 0.44–1.00)
Creatinine, Ser: 1.33 mg/dL — ABNORMAL HIGH (ref 0.44–1.00)
GFR calc Af Amer: 32 mL/min — ABNORMAL LOW (ref >60)
GFR calc Af Amer: 47 mL/min — ABNORMAL LOW (ref >60)
GFR calc non Af Amer: 28 mL/min — ABNORMAL LOW (ref >60)
GFR calc non Af Amer: 41 mL/min — ABNORMAL LOW (ref >60)
Glucose, Bld: 332 mg/dL — ABNORMAL HIGH (ref 65–99)
Glucose, Bld: 182 mg/dL — ABNORMAL HIGH (ref 65–99)
Potassium: 3 mmol/L — ABNORMAL LOW (ref 3.5–5.1)
Potassium: 3.7 mmol/L (ref 3.5–5.1)
Sodium: 134 mmol/L — ABNORMAL LOW (ref 135–145)
Sodium: 136 mmol/L (ref 135–145)

## 2016-03-08 LAB — GLUCOSE, CAPILLARY
Glucose-Capillary: 380 mg/dL — ABNORMAL HIGH (ref 65–99)
Glucose-Capillary: 293 mg/dL — ABNORMAL HIGH (ref 65–99)
Glucose-Capillary: 190 mg/dL — ABNORMAL HIGH (ref 65–99)
Glucose-Capillary: 178 mg/dL — ABNORMAL HIGH (ref 65–99)
Glucose-Capillary: 175 mg/dL — ABNORMAL HIGH (ref 65–99)
Glucose-Capillary: 195 mg/dL — ABNORMAL HIGH (ref 65–99)
Glucose-Capillary: 174 mg/dL — ABNORMAL HIGH (ref 65–99)
Glucose-Capillary: 164 mg/dL — ABNORMAL HIGH (ref 65–99)
Glucose-Capillary: 175 mg/dL — ABNORMAL HIGH (ref 65–99)
Glucose-Capillary: 187 mg/dL — ABNORMAL HIGH (ref 65–99)
Glucose-Capillary: 319 mg/dL — ABNORMAL HIGH (ref 65–99)
Glucose-Capillary: 325 mg/dL — ABNORMAL HIGH (ref 65–99)

## 2016-03-08 LAB — MAGNESIUM: Magnesium: 2.4 mg/dL (ref 1.7–2.4)

## 2016-03-08 LAB — CBC WITH DIFFERENTIAL/PLATELET
Basophils Absolute: 0 10*3/uL (ref 0.0–0.1)
Basophils Relative: 0 %
Eosinophils Absolute: 0.2 10*3/uL (ref 0.0–0.7)
Eosinophils Relative: 1 %
HCT: 35.2 % — ABNORMAL LOW (ref 36.0–46.0)
Hemoglobin: 11.6 g/dL — ABNORMAL LOW (ref 12.0–15.0)
Lymphocytes Relative: 37 %
Lymphs Abs: 4.1 10*3/uL — ABNORMAL HIGH (ref 0.7–4.0)
MCH: 27.2 pg (ref 26.0–34.0)
MCHC: 33 g/dL (ref 30.0–36.0)
MCV: 82.4 fL (ref 78.0–100.0)
Monocytes Absolute: 0.7 10*3/uL (ref 0.1–1.0)
Monocytes Relative: 6 %
Neutro Abs: 6.1 10*3/uL (ref 1.7–7.7)
Neutrophils Relative %: 56 %
Platelets: 175 10*3/uL (ref 150–400)
RBC: 4.27 MIL/uL (ref 3.87–5.11)
RDW: 11.6 % (ref 11.5–15.5)
WBC: 11 10*3/uL — ABNORMAL HIGH (ref 4.0–10.5)

## 2016-03-08 LAB — URINE MICROSCOPIC-ADD ON

## 2016-03-08 LAB — URINALYSIS, ROUTINE W REFLEX MICROSCOPIC
Bilirubin Urine: NEGATIVE
Glucose, UA: >1000 mg/dL — AB
Ketones, ur: NEGATIVE mg/dL
Nitrite: NEGATIVE
Protein, ur: NEGATIVE mg/dL
Specific Gravity, Urine: 1.028 (ref 1.005–1.030)
pH: 5.5 (ref 5.0–8.0)

## 2016-03-08 LAB — MRSA PCR SCREENING: MRSA by PCR: POSITIVE — AB

## 2016-03-08 LAB — CBG MONITORING, ED: Glucose-Capillary: 587 mg/dL (ref 65–99)

## 2016-03-08 LAB — TSH: TSH: 4.278 u[IU]/mL (ref 0.350–4.500)

## 2016-03-08 MED ORDER — DEXTROSE 50 % IV SOLN: 25.0000 mL | INTRAVENOUS | Status: DC | PRN

## 2016-03-08 MED ORDER — AMLODIPINE BESYLATE 5 MG PO TABS
5.0000 mg | ORAL_TABLET | Freq: Every day | ORAL | Status: DC
  Administered 2016-03-08: 5 mg via ORAL
  Filled 2016-03-08: qty 1

## 2016-03-08 MED ORDER — MUPIROCIN 2 % EX OINT
1.0000 "application " | TOPICAL_OINTMENT | Freq: Two times a day (BID) | CUTANEOUS | Status: DC
  Administered 2016-03-08 – 2016-03-11 (×7): 1 via NASAL
  Filled 2016-03-08 (×3): qty 22

## 2016-03-08 MED ORDER — INSULIN REGULAR BOLUS VIA INFUSION
0.0000 [IU] | Freq: Three times a day (TID) | INTRAVENOUS | Status: DC
Start: 2016-03-08 — End: 2016-03-08
  Administered 2016-03-08: 3.6 [IU] via INTRAVENOUS
  Administered 2016-03-08: 3.5 [IU] via INTRAVENOUS
  Administered 2016-03-08: 3.4 [IU] via INTRAVENOUS
  Administered 2016-03-08: 3.1 [IU] via INTRAVENOUS
  Filled 2016-03-08: qty 10

## 2016-03-08 MED ORDER — CHLORHEXIDINE GLUCONATE CLOTH 2 % EX PADS
6.0000 | MEDICATED_PAD | Freq: Every day | CUTANEOUS | Status: DC
  Administered 2016-03-09 – 2016-03-11 (×3): 6 via TOPICAL

## 2016-03-08 MED ORDER — ACETAMINOPHEN 650 MG RE SUPP: 650.0000 mg | Freq: Four times a day (QID) | RECTAL | Status: DC | PRN

## 2016-03-08 MED ORDER — ENOXAPARIN SODIUM 40 MG/0.4ML ~~LOC~~ SOLN: 40.0000 mg | SUBCUTANEOUS | Status: DC

## 2016-03-08 MED ORDER — ATORVASTATIN CALCIUM 40 MG PO TABS
40.0000 mg | ORAL_TABLET | Freq: Every day | ORAL | Status: DC
  Administered 2016-03-08 – 2016-03-10 (×3): 40 mg via ORAL
  Filled 2016-03-08 (×3): qty 1

## 2016-03-08 MED ORDER — DEXTROSE 5 % IV SOLN
1.0000 g | INTRAVENOUS | Status: DC
  Filled 2016-03-08: qty 10

## 2016-03-08 MED ORDER — LEVOTHYROXINE SODIUM 112 MCG PO TABS
112.0000 ug | ORAL_TABLET | Freq: Every day | ORAL | Status: DC
  Administered 2016-03-08 – 2016-03-09 (×2): 112 ug via ORAL
  Filled 2016-03-08 (×2): qty 1

## 2016-03-08 MED ORDER — INSULIN GLARGINE 100 UNIT/ML ~~LOC~~ SOLN
20.0000 [IU] | Freq: Every day | SUBCUTANEOUS | Status: DC
  Administered 2016-03-08: 20 [IU] via SUBCUTANEOUS
  Filled 2016-03-08 (×2): qty 0.2

## 2016-03-08 MED ORDER — LORATADINE 10 MG PO TABS
10.0000 mg | ORAL_TABLET | Freq: Every day | ORAL | Status: DC
  Administered 2016-03-08 – 2016-03-11 (×4): 10 mg via ORAL
  Filled 2016-03-08 (×4): qty 1

## 2016-03-08 MED ORDER — PANTOPRAZOLE SODIUM 40 MG PO TBEC
40.0000 mg | DELAYED_RELEASE_TABLET | Freq: Every day | ORAL | Status: DC
  Administered 2016-03-08 – 2016-03-11 (×4): 40 mg via ORAL
  Filled 2016-03-08 (×4): qty 1

## 2016-03-08 MED ORDER — POTASSIUM CHLORIDE CRYS ER 20 MEQ PO TBCR
60.0000 meq | EXTENDED_RELEASE_TABLET | ORAL | Status: AC
  Administered 2016-03-08: 60 meq via ORAL
  Filled 2016-03-08: qty 3

## 2016-03-08 MED ORDER — INSULIN ASPART 100 UNIT/ML ~~LOC~~ SOLN
0.0000 [IU] | Freq: Three times a day (TID) | SUBCUTANEOUS | Status: DC
  Administered 2016-03-08: 7 [IU] via SUBCUTANEOUS
  Administered 2016-03-09: 3 [IU] via SUBCUTANEOUS
  Administered 2016-03-09: 2 [IU] via SUBCUTANEOUS
  Administered 2016-03-09: 1 [IU] via SUBCUTANEOUS

## 2016-03-08 MED ORDER — ACETAMINOPHEN 325 MG PO TABS
650.0000 mg | ORAL_TABLET | Freq: Four times a day (QID) | ORAL | Status: DC | PRN
  Administered 2016-03-10: 650 mg via ORAL
  Filled 2016-03-08: qty 2

## 2016-03-08 MED ORDER — SODIUM CHLORIDE 0.9 % IV SOLN
1.0000 g | Freq: Once | INTRAVENOUS | Status: AC
  Administered 2016-03-08: 1 g via INTRAVENOUS
  Filled 2016-03-08: qty 10

## 2016-03-08 MED ORDER — DEXTROSE-NACL 5-0.45 % IV SOLN
INTRAVENOUS | Status: DC
  Administered 2016-03-08 (×2): via INTRAVENOUS

## 2016-03-08 MED ORDER — HEPARIN SODIUM (PORCINE) 5000 UNIT/ML IJ SOLN
5000.0000 [IU] | Freq: Three times a day (TID) | INTRAMUSCULAR | Status: DC
  Administered 2016-03-08 – 2016-03-11 (×9): 5000 [IU] via SUBCUTANEOUS
  Filled 2016-03-08 (×9): qty 1

## 2016-03-08 MED ORDER — ONDANSETRON HCL 4 MG/2ML IJ SOLN: 4.0000 mg | Freq: Four times a day (QID) | INTRAMUSCULAR | Status: DC | PRN

## 2016-03-08 MED ORDER — SODIUM CHLORIDE 0.9 % IV SOLN
INTRAVENOUS | Status: DC
  Administered 2016-03-09: 05:00:00 via INTRAVENOUS

## 2016-03-08 MED ORDER — ASPIRIN 325 MG PO TABS
325.0000 mg | ORAL_TABLET | Freq: Every day | ORAL | Status: DC
  Administered 2016-03-08 – 2016-03-11 (×4): 325 mg via ORAL
  Filled 2016-03-08 (×4): qty 1

## 2016-03-08 MED ORDER — ALBUTEROL SULFATE (2.5 MG/3ML) 0.083% IN NEBU: 2.5000 mg | INHALATION_SOLUTION | RESPIRATORY_TRACT | Status: DC | PRN

## 2016-03-08 MED ORDER — SODIUM CHLORIDE 0.9% FLUSH
3.0000 mL | Freq: Two times a day (BID) | INTRAVENOUS | Status: DC
  Administered 2016-03-08 – 2016-03-11 (×7): 3 mL via INTRAVENOUS

## 2016-03-08 MED ORDER — SODIUM CHLORIDE 0.9 % IV SOLN: INTRAVENOUS | Status: DC

## 2016-03-08 MED ORDER — ONDANSETRON HCL 4 MG PO TABS: 4.0000 mg | ORAL_TABLET | Freq: Four times a day (QID) | ORAL | Status: DC | PRN

## 2016-03-08 NOTE — Progress Notes (Addendum)
Inpatient Diabetes Program Recommendations  AACE/ADA: New Consensus Statement on Inpatient Glycemic Control (2015)  Target Ranges:  Prepandial:   less than 140 mg/dL      Peak postprandial:   less than 180 mg/dL (1-2 hours)      Critically ill patients:  140 - 180 mg/dL   Results for ZAYLIANA, PARADY (MRN [id number]) as of 03/08/2016 09:50  Ref. Range 03/07/2016 21:50 03/08/2016 00:17 03/08/2016 01:43 03/08/2016 02:48 03/08/2016 03:37 03/08/2016 04:53 03/08/2016 05:46 03/08/2016 06:18  Glucose-Capillary Latest Ref Range: 65 - 99 mg/dL >600 (HH) 587 (HH) 380 (H) 293 (H) 190 (H) 178 (H) 195 (H) 175 (H)   Results for CAREN, STARZYK (MRN [id number]) as of 03/08/2016 09:50  Ref. Range 11/20/2015 17:12  Hemoglobin A1C Unknown 9.2   Review of Glycemic Control  Diabetes history: DM2 Outpatient Diabetes medications: Lantus 50 units q hs + Victoza 1.2 q hs Current orders for Inpatient glycemic control: IV insulin drip + meal carb coverage tid  Inpatient Diabetes Program Recommendations:  Noted patient on glucostabilizer. Please note patient will need Lantus insulin to be given 2 hrs. Prior to insulin drip discontinued and cover last CBG when IV insulin drip discontinued. Will speak with patient concerning medication compliance. Spoke with patient regarding medication compliance and need for insulin. Pt. States she now knows not to stop her insulin and agrees to call her physician if she has difficulty with needing prescriptions. Reviewed with patient A1c and risks involved with elevated blood glucose and gave handout on basic nutrition plate method. Pt. States she does not drink sweetended drips but eats quite a bit of pasta. Reviewed serving sizes of pasta and rice.  Thank you, Nani Gasser. Veer Elamin, RN, MSN, CDE Inpatient Glycemic Control Team Team Pager [phone] (8am-5pm) 03/08/2016 10:00 AM

## 2016-03-08 NOTE — H&P (Addendum)
History and Physical    BRENNLEY CURTICE Skinner:[id number] DOB: 1951-04-25 DOA: 03/07/2016  Referring MD/NP/PA:  Antonietta Breach, PA-C PCP: Lance Bosch, NP  Patient coming from: Home  Chief Complaint: "My left arm twitching"  HPI: Sonia Skinner is a 65 y.o. female with medical history significant of diabetes mellitus type 2, anxiety/depression, HTN, CVA; who presents with complaints of left arm twitching. Patient is left hand dominant and was unable to get her hand to stop twitching and therefore she came in for further evaluation as someone told her that her electrolytes could be out of balance. Patient notes that she's had diabetes since the age of 54. Patient expresses poor compliance with her diabetes regimen. She is taking any of her insulins for the last 3 days because she just wanted to have a break. She complains of some nausea, polydipsia, and urinary frequency.  Denies any significant abdominal pain, diarrhea, shortness of breath, vomiting,  ED Course: On admission to the emergency department patient was evaluated and seen to be afebrile with vital signs within normal limits. Lab work revealed sodium 121, potassium 4.9, chloride 83, CO2 24, BUN 39, creatinine 2.38, calcium 9.1, ionized calcium 1.05, glucose 10039, and AG 14. In the ED patient was given 1 L of IV fluids and started on insulin drip. [facility] called to admit. Urinalysis had not been completed at this time.   Review of Systems: As per HPI otherwise 10 point review of systems negative.   Past Medical History:  Diagnosis Date  . Anxiety   . Carotid artery occlusion   . Complication of anesthesia    ALLERY TO ESTER BASE  . Depression    early 59s  . Diabetes mellitus    INSULIN DEPENDENT  . GERD (gastroesophageal reflux disease)   . Headache   . Hypertension   . Stroke [facility]) March 2015    left MCA infarct  . Thyroid disease     Past Surgical History:  Procedure Laterality Date  . ABDOMINAL HYSTERECTOMY    . ANTERIOR FIXATION  AND POSTERIOR MICRODISCECTOMY CERVICAL SPINE  1999  . CAROTID ENDARTERECTOMY Left 11-04-13   cea  . ENDARTERECTOMY Left 11/04/2013     reports that she quit smoking about 2 years ago. Her smoking use included Cigarettes. She has a 4.00 pack-year smoking history. She has never used smokeless tobacco. She reports that she does not drink alcohol or use drugs.  Allergies  Allergen Reactions  . Anesthetics, Ester Anaphylaxis    Family History  Problem Relation Age of Onset  . Diabetes Mother   . Heart disease Mother     Before age 5  . Cancer Father     Lung  . Hypertension Sister   . Diabetes Sister   . Diabetes Sister   . Diabetes Sister     Prior to Admission medications   Medication Sig Start Date End Date Taking? Authorizing Provider  ACCU-CHEK SOFTCLIX LANCETS lancets ACHS for E11.9 03/25/15   Lance Bosch, NP  amitriptyline (ELAVIL) 50 MG tablet Take 1 tablet (50 mg total) by mouth at bedtime. For depression and migraines 02/18/16   Kathlee Nations, MD  amLODipine (NORVASC) 5 MG tablet TAKE ONE TABLET BY MOUTH ONCE DAILY 01/19/16   Tresa Garter, MD  aspirin 325 MG tablet Take 1 tablet (325 mg total) by mouth daily. 11/06/13   Samella Parr, NP  atorvastatin (LIPITOR) 40 MG tablet TAKE 1 TABLET BY MOUTH DAILY AT 6 PM. 08/14/15  Ambrose Finland, NP  Blood Glucose Monitoring Suppl (ACCU-CHEK AVIVA PLUS) w/Device KIT ACHS for E11.9 08/14/15   Ambrose Finland, NP  desloratadine (CLARINEX) 5 MG tablet Take 1 tablet (5 mg total) by mouth daily. 04/16/15   April Palumbo, MD  escitalopram (LEXAPRO) 20 MG tablet Take 1 tab daily 02/18/16   Cleotis Nipper, MD  fluticasone (FLONASE) 50 MCG/ACT nasal spray Place 2 sprays into both nostrils daily. Patient not taking: Reported on 11/25/2015 04/16/15   April Palumbo, MD  GAVILYTE-N WITH FLAVOR PACK 420 g solution Take 4,000 mLs by mouth once. Reported on 01/27/2016 11/24/15   Historical Provider, MD  glucose blood (ACCU-CHEK AVIVA) test strip Check  blood sugar AC and qHS for E11.9 01/23/16   Quentin Angst, MD  insulin glargine (LANTUS) 100 UNIT/ML injection Inject 0.38 mLs (38 Units total) into the skin at bedtime. 08/14/15   Ambrose Finland, NP  INSULIN SYRINGE .5CC/28G 28G X 1/2" 0.5 ML MISC Give insulin once per night 08/14/15   Ambrose Finland, NP  Lancet Devices [facility]) lancets Check blood sugar ACHS for E11.9 08/14/15   Ambrose Finland, NP  levothyroxine (SYNTHROID, LEVOTHROID) 100 MCG tablet Take 1 tablet (100 mcg total) by mouth daily. Patient not taking: Reported on 11/25/2015 08/06/15   Ambrose Finland, NP  levothyroxine (SYNTHROID, LEVOTHROID) 112 MCG tablet Take 112 mcg by mouth daily. 08/19/15   Historical Provider, MD  Liraglutide (VICTOZA) 18 MG/3ML SOPN Inject 0.2 mLs (1.2 mg total) into the skin daily. 12/05/15   Quentin Angst, MD  lisinopril-hydrochlorothiazide (PRINZIDE,ZESTORETIC) 20-25 MG tablet Take 1 tablet by mouth daily. Needs office visit for refills 02/26/16   Quentin Angst, MD  loratadine (CLARITIN) 10 MG tablet TAKE ONE TABLET BY MOUTH ONCE DAILY 01/19/16   Quentin Angst, MD  omeprazole (PRILOSEC) 40 MG capsule TAKE ONE CAPSULE BY MOUTH ONCE DAILY 02/26/16   Quentin Angst, MD    Physical Exam:    Constitutional: Elderly female who appears in NAD, calm, comfortable Vitals:   03/07/16 2145 03/07/16 2230 03/07/16 2245 03/07/16 2315  BP: (!) 106/46 116/69 (!) 121/51 (!) 122/41  Pulse: 97 97 99 94  Resp:    16  Temp:      TempSrc:      SpO2: 96% 93% 99% 100%  Weight:      Height:       Eyes: PERRL, lids and conjunctivae normal ENMT: Mucous membranes are dry. Posterior pharynx clear of any exudate or lesions.Normal dentition.  Neck: normal, supple, no masses, no thyromegaly Respiratory: clear to auscultation bilaterally, no wheezing, no crackles. Normal respiratory effort. No accessory muscle use.  Cardiovascular: Regular rate and rhythm, no murmurs / rubs / gallops. No  extremity edema. 2+ pedal pulses. No carotid bruits.  Abdomen: no tenderness, no masses palpated. No hepatosplenomegaly. Bowel sounds positive.  Musculoskeletal: no clubbing / cyanosis. No joint deformity upper and lower extremities. Good ROM, no contractures. Normal muscle tone.  Skin: no rashes, lesions, ulcers. No induration Neurologic: CN 2-12 grossly intact. Sensation abnormal, DTR normal. Strength 5/5 in all 4.  Psychiatric: Normal judgment and insight. Alert and oriented x 3. Normal mood.     Labs on Admission: I have personally reviewed following labs and imaging studies  CBC:  Recent Labs Lab 03/07/16 2237  HGB 13.6  HCT 40.0   Basic Metabolic Panel:  Recent Labs Lab 03/07/16 2223 03/07/16 2237  NA 121* 121*  K 4.9 5.1  CL 83* 85*  CO2 24  --   GLUCOSE 1,003* >700*  BUN 39* 50*  CREATININE 2.38* 2.20*  CALCIUM 9.1  --    GFR: Estimated Creatinine Clearance: 19.9 mL/min (by C-G formula based on SCr of 2.2 mg/dL). Liver Function Tests:  Recent Labs Lab 03/07/16 2223  AST 11*  ALT 13*  ALKPHOS 182*  BILITOT 0.5  PROT 7.3  ALBUMIN 3.7   No results for input(s): LIPASE, AMYLASE in the last 168 hours. No results for input(s): AMMONIA in the last 168 hours. Coagulation Profile: No results for input(s): INR, PROTIME in the last 168 hours. Cardiac Enzymes: No results for input(s): CKTOTAL, CKMB, CKMBINDEX, TROPONINI in the last 168 hours. BNP (last 3 results) No results for input(s): PROBNP in the last 8760 hours. HbA1C: No results for input(s): HGBA1C in the last 72 hours. CBG:  Recent Labs Lab 03/07/16 2150 03/08/16 0017  GLUCAP >600* 587*   Lipid Profile: No results for input(s): CHOL, HDL, LDLCALC, TRIG, CHOLHDL, LDLDIRECT in the last 72 hours. Thyroid Function Tests: No results for input(s): TSH, T4TOTAL, FREET4, T3FREE, THYROIDAB in the last 72 hours. Anemia Panel: No results for input(s): VITAMINB12, FOLATE, FERRITIN, TIBC, IRON,  RETICCTPCT in the last 72 hours. Urine analysis:    Component Value Date/Time   COLORURINE YELLOW 06/12/2015 [place] 06/12/2015 1417   LABSPEC 1.022 06/12/2015 1417   PHURINE 5.5 06/12/2015 1417   GLUCOSEU NEGATIVE 06/12/2015 1417   HGBUR NEGATIVE 06/12/2015 1417   BILIRUBINUR NEGATIVE 06/12/2015 1417   KETONESUR NEGATIVE 06/12/2015 1417   PROTEINUR NEGATIVE 06/12/2015 1417   UROBILINOGEN 0.2 06/12/2015 1417   NITRITE NEGATIVE 06/12/2015 1417   LEUKOCYTESUR MODERATE (A) 06/12/2015 1417   Sepsis Labs: No results found for this or any previous visit (from the past 240 hour(s)).   Radiological Exams on Admission: No results found.   Assessment/Plan Hyperosmolar with type 2 diabetes: Acute. Patient wanted a break from taking her insulin over the weekend. Last hemoglobin A1c was noted to be 9.7 and 11/2015. - Admit to stepdown - Glucose stabilizer protocol - Check BMPs q 6 hours and correct electrolytes as needed  - Will need to convert back SSI once BGs more within normal range - Diabetic education in a.m. for noncompliance  Hypocalcemia/left arm twitching: Acute. Ionized calcium noted to be 1.05. - Given 1 g of calcium gluconate now  Acute kidney injury on chronic kidney disease stage III: Baseline creatinine seen previously be around 1.5 last in 11/2015. On presentation patient creatinine 2.38 with a BUN of 39 suspect prerenal in nature. - Follow-up urinalysis  - IV fluids  - Follow-up repeat BMP - Avoid nephrotoxic agents  Essential hypertension - Continue amlodipine - Held lisinopril-hydrochlorothiazide  Hypothyroidism - check TSH - Continue levothyroxine   Noncompliance with medication - Discussed in detail the complications that can occur with no getting better control of her diabetes.  Depression: Patient has not taken her amitriptyline and Lexapro in over one week - Will need to be restart Lexapro and Elavil if warranted  Hyperlipidemia  -  Continue atorvastatin   GERD - Pharmacy substitution of Protonix   Tobacco abuse  - Counseled him needed succession of tobacco use   DVT prophylaxis:  Heparin Code Status: Full Family Communication: No family present at bedside Disposition Plan:  possible discharge home in 2-3 days Consults called: none Admission status:  Observation stepdown  Norval Morton MD [facility] Pager [phone]  If 7PM-7AM, please  contact night-coverage [email or web address] Password [email or web address]  03/08/2016, 12:21 AM

## 2016-03-08 NOTE — Progress Notes (Signed)
Pt seen and examined at bedside, admitted after midnight, please see earlier admission note by Dr. Tamala Julian. Pt admitted with hyperglycemia and started on insulin drip, better this AM, diet advanced. Place on insulin lantus and SSI, stop insulin drip. Continue IVF for now and reassess this afternoon.   Faye Ramsay, MD  [facility] Pager [phone]  If 7PM-7AM, please contact night-coverage [email or web address] Password [email or web address]

## 2016-03-08 NOTE — ED Triage Notes (Signed)
Pt brought in by EMS with c/o left arm tremor that began earlier today. Pt diabetic and CBG read "high". Pt has not taken her blood glucose or insulin since Friday. Pt denies abdominal pain, denies weakness, and denies headache. Pt reports intermittent nausea.

## 2016-03-09 LAB — GLUCOSE, CAPILLARY
Glucose-Capillary: 148 mg/dL — ABNORMAL HIGH (ref 65–99)
Glucose-Capillary: 181 mg/dL — ABNORMAL HIGH (ref 65–99)
Glucose-Capillary: 420 mg/dL — ABNORMAL HIGH (ref 65–99)
Glucose-Capillary: 228 mg/dL — ABNORMAL HIGH (ref 65–99)
Glucose-Capillary: 170 mg/dL — ABNORMAL HIGH (ref 65–99)
Glucose-Capillary: 269 mg/dL — ABNORMAL HIGH (ref 65–99)

## 2016-03-09 LAB — BASIC METABOLIC PANEL
Anion gap: 3 — ABNORMAL LOW (ref 5–15)
BUN: 12 mg/dL (ref 6–20)
CO2: 22 mmol/L (ref 22–32)
Calcium: 8.1 mg/dL — ABNORMAL LOW (ref 8.9–10.3)
Chloride: 111 mmol/L (ref 101–111)
Creatinine, Ser: 1.05 mg/dL — ABNORMAL HIGH (ref 0.44–1.00)
GFR calc Af Amer: >60 mL/min (ref >60)
GFR calc non Af Amer: 54 mL/min — ABNORMAL LOW (ref >60)
Glucose, Bld: 146 mg/dL — ABNORMAL HIGH (ref 65–99)
Potassium: 3.5 mmol/L (ref 3.5–5.1)
Sodium: 136 mmol/L (ref 135–145)

## 2016-03-09 LAB — CBC
HCT: 32.8 % — ABNORMAL LOW (ref 36.0–46.0)
Hemoglobin: 10.5 g/dL — ABNORMAL LOW (ref 12.0–15.0)
MCH: 27 pg (ref 26.0–34.0)
MCHC: 32 g/dL (ref 30.0–36.0)
MCV: 84.3 fL (ref 78.0–100.0)
Platelets: 168 10*3/uL (ref 150–400)
RBC: 3.89 MIL/uL (ref 3.87–5.11)
RDW: 12 % (ref 11.5–15.5)
WBC: 10.5 10*3/uL (ref 4.0–10.5)

## 2016-03-09 LAB — URINE CULTURE

## 2016-03-09 MED ORDER — FLUCONAZOLE 100 MG PO TABS
100.0000 mg | ORAL_TABLET | Freq: Every day | ORAL | Status: DC
  Administered 2016-03-09 – 2016-03-11 (×3): 100 mg via ORAL
  Filled 2016-03-09 (×3): qty 1

## 2016-03-09 MED ORDER — INSULIN ASPART 100 UNIT/ML ~~LOC~~ SOLN
5.0000 [IU] | Freq: Three times a day (TID) | SUBCUTANEOUS | Status: DC
  Administered 2016-03-09 (×2): 5 [IU] via SUBCUTANEOUS

## 2016-03-09 MED ORDER — INSULIN ASPART 100 UNIT/ML ~~LOC~~ SOLN
12.0000 [IU] | Freq: Once | SUBCUTANEOUS | Status: AC
  Administered 2016-03-09: 12 [IU] via SUBCUTANEOUS

## 2016-03-09 MED ORDER — INSULIN GLARGINE 100 UNIT/ML ~~LOC~~ SOLN
30.0000 [IU] | Freq: Every day | SUBCUTANEOUS | Status: DC
  Administered 2016-03-10 – 2016-03-11 (×2): 30 [IU] via SUBCUTANEOUS
  Filled 2016-03-09 (×2): qty 0.3

## 2016-03-09 NOTE — Progress Notes (Signed)
Pt does not currently have any intravenous medication or fluids ordered therefore, at this time an IV initiated by the IV speciality team is not indicated. Nurse made aware. Catalina Pizza

## 2016-03-09 NOTE — Plan of Care (Signed)
Problem: Safety: Goal: Ability to remain free from injury will improve Outcome: Progressing Discussed the importance of calling to using the bathroom and safety with teach back.

## 2016-03-09 NOTE — Progress Notes (Addendum)
Patient ID: Sonia Skinner, female   DOB: 1951-01-06, 65 y.o.   MRN: [id number]   PROGRESS NOTE    Sonia Skinner  [id number] DOB: [birth year] DOA: 03/07/2016  PCP: Lance Bosch, NP   Brief Narrative:  65 y.o. female with diabetes mellitus type 2, anxiety/depression, HTN, CVA; who presented with fatigue, confusion, poor oral intake, nausea, muscle twitches. In ED, CBG severely elevated and pt admitted for further evaluation.   Assessment & Plan:   Principal Problem:   Hyperosmolar non-ketotic state in patient with type 2 diabetes mellitus ([place]) - has been off of insulin drip since yesterday - doing better, more alert and eating breakfast - will increase lantus to 30 U, continue SSI - appreciate diabetic educator assistance   Active Problems:   Essential hypertension - reasonable inpatient control     Hypothyroidism - continue synthroid     Depression - stable     AKI (acute kidney injury) ([place]) - pre renal azotemia - from HONK state - IVF provided and Cr trending down - BMP in AM    Hyponatremia - also pre renal in etiology from hyperglycemia and dehydration - resolved    Hypokalemia  - mild, supplemented, BMP in AM  DVT prophylaxis: Heparin SQ Code Status: Full  Family Communication: Patient at bedside, sister over the phone  Disposition Plan: Home in AM   Consultants:   None   Procedures:   None  Antimicrobials:   None   Subjective: No events overnight. Pt reports feeling better this AM.   Objective: Vitals:   03/09/16 0200 03/09/16 0331 03/09/16 0400 03/09/16 0909  BP: (!) 108/53 (!) 108/53 122/66 (!) 125/53  Pulse: 80  81 87  Resp: 19  19   Temp:  98.8 F (37.1 C)  98.6 F (37 C)  TempSrc:  Oral  Oral  SpO2: 99%  100% 100%  Weight:      Height:        Intake/Output Summary (Last 24 hours) at 03/09/16 1104 Last data filed at 03/09/16 0333  Gross per 24 hour  Intake             1355 ml  Output              750 ml  Net              605  ml   Filed Weights   03/07/16 2028 03/08/16 0100  Weight: 56.7 kg (125 lb) 54.1 kg (119 lb 4.3 oz)    Examination:  General exam: Appears calm and comfortable  Respiratory system: Clear to auscultation. Respiratory effort normal. Cardiovascular system: S1 & S2 heard, RRR. No JVD, murmurs, rubs, gallops or clicks. Gastrointestinal system: Abdomen is nondistended, soft and nontender. No organomegaly Central nervous system: Alert and oriented. No focal neurological deficits. Extremities: Symmetric 5 x 5 power.  Data Reviewed: I have personally reviewed following labs and imaging studies  CBC:  Recent Labs Lab 03/07/16 2237 03/08/16 0056 03/09/16 0249  WBC  --  11.0* 10.5  NEUTROABS  --  6.1  --   HGB 13.6 11.6* 10.5*  HCT 40.0 35.2* 32.8*  MCV  --  82.4 84.3  PLT  --  175 [id number]   Basic Metabolic Panel:  Recent Labs Lab 03/07/16 2223 03/07/16 2237 03/08/16 0149 03/08/16 0701 03/09/16 0249  NA 121* 121* 134* 136 136  K 4.9 5.1 3.0* 3.7 3.5  CL 83* 85* 100* 106 111  CO2 24  --  25 24 22   GLUCOSE 1,003* >700* 332* 182* 146*  BUN 39* 50* 32* 25* 12  CREATININE 2.38* 2.20* 1.82* 1.33* 1.05*  CALCIUM 9.1  --  9.1 8.4* 8.1*  MG  --   --  2.4  --   --    Liver Function Tests:  Recent Labs Lab 03/07/16 2223  AST 11*  ALT 13*  ALKPHOS 182*  BILITOT 0.5  PROT 7.3  ALBUMIN 3.7   CBG:  Recent Labs Lab 03/08/16 1123 03/08/16 1234 03/08/16 1718 03/08/16 2128 03/09/16 0818  GLUCAP 175* 187* 319* 325* 148*   Thyroid Function Tests:  Recent Labs  03/08/16 0701  TSH 4.278   Urine analysis:    Component Value Date/Time   COLORURINE YELLOW 03/08/2016 0424   APPEARANCEUR CLOUDY (A) 03/08/2016 0424   LABSPEC 1.028 03/08/2016 0424   PHURINE 5.5 03/08/2016 0424   GLUCOSEU >1000 (A) 03/08/2016 0424   HGBUR TRACE (A) 03/08/2016 0424   BILIRUBINUR NEGATIVE 03/08/2016 0424   KETONESUR NEGATIVE 03/08/2016 0424   PROTEINUR NEGATIVE 03/08/2016 0424    UROBILINOGEN 0.2 06/12/2015 1417   NITRITE NEGATIVE 03/08/2016 0424   LEUKOCYTESUR SMALL (A) 03/08/2016 0424   Recent Results (from the past 240 hour(s))  MRSA PCR Screening     Status: Abnormal   Collection Time: 03/08/16  2:34 AM  Result Value Ref Range Status   MRSA by PCR POSITIVE (A) NEGATIVE Final  Culture, Urine     Status: Abnormal   Collection Time: 03/08/16  4:24 AM  Result Value Ref Range Status   Specimen Description URINE, CLEAN CATCH  Final   Special Requests NONE  Final   Report Status 03/09/2016 FINAL  Final   Radiology Studies: No results found.  Scheduled Meds: . aspirin  325 mg Oral Daily  . atorvastatin  40 mg Oral q1800  . Chlorhexidine Gluconate Cloth  6 each Topical Q0600  . heparin  5,000 Units Subcutaneous Q8H  . insulin aspart  0-9 Units Subcutaneous TID WC  . [START ON 03/10/2016] insulin glargine  30 Units Subcutaneous Daily  . levothyroxine  112 mcg Oral QAC breakfast  . loratadine  10 mg Oral Daily  . mupirocin ointment  1 application Nasal BID  . pantoprazole  40 mg Oral Daily  . sodium chloride flush  3 mL Intravenous Q12H   Continuous Infusions: . sodium chloride 125 mL/hr at 03/09/16 0524    LOS: 1 day   Time spent: 20 minutes   Faye Ramsay, MD [facility] Pager [phone]  If 7PM-7AM, please contact night-coverage [email or web address] Password [facility] 03/09/2016, 11:04 AM

## 2016-03-09 NOTE — Progress Notes (Signed)
Inpatient Diabetes Program Recommendations  AACE/ADA: New Consensus Statement on Inpatient Glycemic Control (2015)  Target Ranges:  Prepandial:   less than 140 mg/dL      Peak postprandial:   less than 180 mg/dL (1-2 hours)      Critically ill patients:  140 - 180 mg/dL     Review of Glycemic Control  Diabetes history: DM2 Outpatient Diabetes medications: Lantus 50 units q hs + Victoza 1.2 q hs Current orders for Inpatient glycemic control: Lantus 20 units + Novolog correction 0-9 units tid  Inpatient Diabetes Program Recommendations:  Please consider increase in Lantus insulin to 30 units qd and add Novolog hs correction 0-5 units.  Thank you, Nani Gasser. Leah Skora, RN, MSN, CDE Inpatient Glycemic Control Team Team Pager [phone] (8am-5pm) 03/09/2016 8:56 AM

## 2016-03-10 DIAGNOSIS — N179 Acute kidney failure, unspecified: Secondary | ICD-10-CM

## 2016-03-10 DIAGNOSIS — E118 Type 2 diabetes mellitus with unspecified complications: Secondary | ICD-10-CM

## 2016-03-10 DIAGNOSIS — E1101 Type 2 diabetes mellitus with hyperosmolarity with coma: Secondary | ICD-10-CM

## 2016-03-10 DIAGNOSIS — IMO0002 Reserved for concepts with insufficient information to code with codable children: Secondary | ICD-10-CM | POA: Diagnosis present

## 2016-03-10 DIAGNOSIS — E032 Hypothyroidism due to medicaments and other exogenous substances: Secondary | ICD-10-CM

## 2016-03-10 DIAGNOSIS — F329 Major depressive disorder, single episode, unspecified: Secondary | ICD-10-CM

## 2016-03-10 DIAGNOSIS — I10 Essential (primary) hypertension: Secondary | ICD-10-CM

## 2016-03-10 DIAGNOSIS — E1165 Type 2 diabetes mellitus with hyperglycemia: Principal | ICD-10-CM

## 2016-03-10 LAB — CBC
HCT: 29.6 % — ABNORMAL LOW (ref 36.0–46.0)
Hemoglobin: 9.7 g/dL — ABNORMAL LOW (ref 12.0–15.0)
MCH: 27.7 pg (ref 26.0–34.0)
MCHC: 32.8 g/dL (ref 30.0–36.0)
MCV: 84.6 fL (ref 78.0–100.0)
Platelets: 161 10*3/uL (ref 150–400)
RBC: 3.5 MIL/uL — ABNORMAL LOW (ref 3.87–5.11)
RDW: 12.3 % (ref 11.5–15.5)
WBC: 9.6 10*3/uL (ref 4.0–10.5)

## 2016-03-10 LAB — BASIC METABOLIC PANEL
Anion gap: 4 — ABNORMAL LOW (ref 5–15)
BUN: 6 mg/dL (ref 6–20)
CO2: 23 mmol/L (ref 22–32)
Calcium: 8.3 mg/dL — ABNORMAL LOW (ref 8.9–10.3)
Chloride: 108 mmol/L (ref 101–111)
Creatinine, Ser: 0.99 mg/dL (ref 0.44–1.00)
GFR calc Af Amer: >60 mL/min (ref >60)
GFR calc non Af Amer: 58 mL/min — ABNORMAL LOW (ref >60)
Glucose, Bld: 341 mg/dL — ABNORMAL HIGH (ref 65–99)
Potassium: 4.5 mmol/L (ref 3.5–5.1)
Sodium: 135 mmol/L (ref 135–145)

## 2016-03-10 LAB — GLUCOSE, CAPILLARY
Glucose-Capillary: 304 mg/dL — ABNORMAL HIGH (ref 65–99)
Glucose-Capillary: 210 mg/dL — ABNORMAL HIGH (ref 65–99)
Glucose-Capillary: 171 mg/dL — ABNORMAL HIGH (ref 65–99)
Glucose-Capillary: 149 mg/dL — ABNORMAL HIGH (ref 65–99)

## 2016-03-10 MED ORDER — INSULIN ASPART 100 UNIT/ML ~~LOC~~ SOLN
0.0000 [IU] | SUBCUTANEOUS | Status: DC
  Administered 2016-03-10: 5 [IU] via SUBCUTANEOUS
  Administered 2016-03-10: 2 [IU] via SUBCUTANEOUS
  Administered 2016-03-10: 3 [IU] via SUBCUTANEOUS
  Administered 2016-03-11: 2 [IU] via SUBCUTANEOUS
  Administered 2016-03-11 (×3): 3 [IU] via SUBCUTANEOUS

## 2016-03-10 MED ORDER — INSULIN ASPART 100 UNIT/ML ~~LOC~~ SOLN
10.0000 [IU] | Freq: Three times a day (TID) | SUBCUTANEOUS | Status: DC
  Administered 2016-03-10 – 2016-03-11 (×3): 10 [IU] via SUBCUTANEOUS

## 2016-03-10 MED ORDER — INSULIN ASPART 100 UNIT/ML ~~LOC~~ SOLN
0.0000 [IU] | Freq: Three times a day (TID) | SUBCUTANEOUS | Status: DC
  Administered 2016-03-10: 7 [IU] via SUBCUTANEOUS

## 2016-03-10 MED ORDER — LEVOTHYROXINE SODIUM 112 MCG PO TABS
112.0000 ug | ORAL_TABLET | Freq: Every day | ORAL | Status: DC
  Administered 2016-03-10 – 2016-03-11 (×2): 112 ug via ORAL
  Filled 2016-03-10 (×3): qty 1

## 2016-03-10 MED ORDER — INSULIN ASPART 100 UNIT/ML ~~LOC~~ SOLN
5.0000 [IU] | Freq: Three times a day (TID) | SUBCUTANEOUS | Status: DC
  Administered 2016-03-10: 5 [IU] via SUBCUTANEOUS

## 2016-03-10 NOTE — Progress Notes (Signed)
PROGRESS NOTE    Sonia Skinner  [id number] DOB: [birth year] DOA: 03/07/2016 PCP: Lance Bosch, NP   Brief Narrative:  65 y.o.BF PMHx  Anxiety/Depression,DM Type  2,, HTN, CVA;   who presented with fatigue, confusion, poor oral intake, nausea, muscle twitches. In ED, CBG severely elevated and pt admitted for further evaluation.    Assessment & Plan:   Principal Problem:   Hyperosmolar non-ketotic state in patient with type 2 diabetes mellitus ([name]) Active Problems:   Essential hypertension, benign   Hypothyroidism   Tobacco abuse   Depression   Hyperglycemia   GERD (gastroesophageal reflux disease)   AKI (acute kidney injury) ([facility])   Hx of medication noncompliance   Uncontrolled type 2 diabetes mellitus with complication ([facility])   Hyperosmolar non-ketotic state/Type 2 diabetes mellitus uncontrolled with complications - has been off of insulin drip since yesterday - doing better, more alert and eating breakfast -Lantus to 30 U -Increase NovoLog 10 units QAC -Increase to moderate SSI  Essential hypertension - reasonable inpatient control   Hypothyroidism - continue synthroid   Depression - stable   AKI (acute kidney injury) ([name]) - pre renal azotemia from HONK state Lab Results  Component Value Date   CREATININE 0.99 03/10/2016   CREATININE 1.05 (H) 03/09/2016   CREATININE 1.33 (H) 03/08/2016  -Improved   Hyponatremia - Resolved  Hypokalemia -Resolved    DVT prophylaxis: Subcutaneous heparin Code Status: full Family Communication: none Disposition Plan:  Discharge in a.m. if CBG control   Consultants:  None  Procedures/Significant Events:  None  Cultures  none  Antimicrobials: None   Devices    LINES / TUBES:      Continuous Infusions:    Subjective: 7/26  A/O 4, NAD.  Objective: Vitals:   03/10/16 0600 03/10/16 0751 03/10/16 1202 03/10/16 1602  BP: (!) 112/55 130/69 (!) 132/59 113/74  Pulse: 87 84 86 87  Resp: 15  17 14 17   Temp:  98.9 F (37.2 C) 98.8 F (37.1 C) 98.2 F (36.8 C)  TempSrc:  Oral Oral Oral  SpO2: 100% 100% 96% 99%  Weight:      Height:        Intake/Output Summary (Last 24 hours) at 03/10/16 1941 Last data filed at 03/10/16 1700  Gross per 24 hour  Intake              840 ml  Output              800 ml  Net               40 ml   Filed Weights   03/07/16 2028 03/08/16 0100  Weight: 56.7 kg (125 lb) 54.1 kg (119 lb 4.3 oz)    Examination:  General: A/O 4, NAD., No acute respiratory distress Eyes: negative scleral hemorrhage, negative anisocoria, negative icterus ENT: Negative Runny nose, negative gingival bleeding, Neck:  Negative scars, masses, torticollis, lymphadenopathy, JVD Lungs: Clear to auscultation bilaterally without wheezes or crackles Cardiovascular: Regular rate and rhythm without murmur gallop or rub normal S1 and S2 Abdomen: negative abdominal pain, nondistended, positive soft, bowel sounds, no rebound, no ascites, no appreciable mass Extremities: No significant cyanosis, clubbing, or edema bilateral lower extremities Skin: Negative rashes, lesions, ulcers Psychiatric:  Negative depression, negative anxiety, negative fatigue, negative mania  Central nervous system:  Cranial nerves II through XII intact, tongue/uvula midline, all extremities muscle strength 5/5, sensation intact throughout, negative dysarthria, negative expressive aphasia, negative receptive aphasia.  Marland Kitchen  Data Reviewed: Care during the described time interval was provided by me .  I have reviewed this patient's available data, including medical history, events of note, physical examination, and all test results as part of my evaluation. I have personally reviewed and interpreted all radiology studies.  CBC:  Recent Labs Lab 03/07/16 2237 03/08/16 0056 03/09/16 0249 03/10/16 0241  WBC  --  11.0* 10.5 9.6  NEUTROABS  --  6.1  --   --   HGB 13.6 11.6* 10.5* 9.7*  HCT 40.0 35.2*  32.8* 29.6*  MCV  --  82.4 84.3 84.6  PLT  --  175 168 [id number]   Basic Metabolic Panel:  Recent Labs Lab 03/07/16 2223 03/07/16 2237 03/08/16 0149 03/08/16 0701 03/09/16 0249 03/10/16 0241  NA 121* 121* 134* 136 136 135  K 4.9 5.1 3.0* 3.7 3.5 4.5  CL 83* 85* 100* 106 111 108  CO2 24  --  25 24 22 23   GLUCOSE 1,003* >700* 332* 182* 146* 341*  BUN 39* 50* 32* 25* 12 6  CREATININE 2.38* 2.20* 1.82* 1.33* 1.05* 0.99  CALCIUM 9.1  --  9.1 8.4* 8.1* 8.3*  MG  --   --  2.4  --   --   --    GFR: Estimated Creatinine Clearance: 44.2 mL/min (by C-G formula based on SCr of 0.99 mg/dL). Liver Function Tests:  Recent Labs Lab 03/07/16 2223  AST 11*  ALT 13*  ALKPHOS 182*  BILITOT 0.5  PROT 7.3  ALBUMIN 3.7   No results for input(s): LIPASE, AMYLASE in the last 168 hours. No results for input(s): AMMONIA in the last 168 hours. Coagulation Profile: No results for input(s): INR, PROTIME in the last 168 hours. Cardiac Enzymes: No results for input(s): CKTOTAL, CKMB, CKMBINDEX, TROPONINI in the last 168 hours. BNP (last 3 results) No results for input(s): PROBNP in the last 8760 hours. HbA1C: No results for input(s): HGBA1C in the last 72 hours. CBG:  Recent Labs Lab 03/09/16 1706 03/09/16 2206 03/10/16 0751 03/10/16 1217 03/10/16 1703  GLUCAP 170* 269* 304* 210* 171*   Lipid Profile: No results for input(s): CHOL, HDL, LDLCALC, TRIG, CHOLHDL, LDLDIRECT in the last 72 hours. Thyroid Function Tests:  Recent Labs  03/08/16 0701  TSH 4.278   Anemia Panel: No results for input(s): VITAMINB12, FOLATE, FERRITIN, TIBC, IRON, RETICCTPCT in the last 72 hours. Urine analysis:    Component Value Date/Time   COLORURINE YELLOW 03/08/2016 0424   APPEARANCEUR CLOUDY (A) 03/08/2016 0424   LABSPEC 1.028 03/08/2016 0424   PHURINE 5.5 03/08/2016 0424   GLUCOSEU >1000 (A) 03/08/2016 0424   HGBUR TRACE (A) 03/08/2016 0424   BILIRUBINUR NEGATIVE 03/08/2016 0424   KETONESUR  NEGATIVE 03/08/2016 0424   PROTEINUR NEGATIVE 03/08/2016 0424   UROBILINOGEN 0.2 06/12/2015 1417   NITRITE NEGATIVE 03/08/2016 0424   LEUKOCYTESUR SMALL (A) 03/08/2016 0424   Sepsis Labs: @LABRCNTIP (procalcitonin:4,lacticidven:4)  ) Recent Results (from the past 240 hour(s))  MRSA PCR Screening     Status: Abnormal   Collection Time: 03/08/16  2:34 AM  Result Value Ref Range Status   MRSA by PCR POSITIVE (A) NEGATIVE Final    Comment:        The GeneXpert MRSA Assay (FDA approved for NASAL specimens only), is one component of a comprehensive MRSA colonization surveillance program. It is not intended to diagnose MRSA infection nor to guide or monitor treatment for MRSA infections. RESULT CALLED TO, READ BACK BY AND VERIFIED WITH: Bunnell RN  [id number] 03/08/16 MITCHELL,L   Culture, Urine     Status: Abnormal   Collection Time: 03/08/16  4:24 AM  Result Value Ref Range Status   Specimen Description URINE, CLEAN CATCH  Final   Special Requests NONE  Final   Culture MULTIPLE SPECIES PRESENT, SUGGEST RECOLLECTION (A)  Final   Report Status 03/09/2016 FINAL  Final         Radiology Studies: No results found.      Scheduled Meds: . aspirin  325 mg Oral Daily  . atorvastatin  40 mg Oral q1800  . Chlorhexidine Gluconate Cloth  6 each Topical Q0600  . fluconazole  100 mg Oral Daily  . heparin  5,000 Units Subcutaneous Q8H  . insulin aspart  0-15 Units Subcutaneous Q4H  . insulin aspart  10 Units Subcutaneous TID WC  . insulin glargine  30 Units Subcutaneous Daily  . levothyroxine  112 mcg Oral QAC breakfast  . loratadine  10 mg Oral Daily  . mupirocin ointment  1 application Nasal BID  . pantoprazole  40 mg Oral Daily  . sodium chloride flush  3 mL Intravenous Q12H   Continuous Infusions:    LOS: 2 days    Time spent: 40 minutes    Yury Schaus, Geraldo Docker, MD [facility] Pager [phone]   If 7PM-7AM, please contact night-coverage [email or web address] Password  [facility] 03/10/2016, 7:41 PM

## 2016-03-11 ENCOUNTER — Ambulatory Visit (HOSPITAL_COMMUNITY): Payer: Medicare Other | Admitting: Psychiatry

## 2016-03-11 LAB — GLUCOSE, CAPILLARY
Glucose-Capillary: 138 mg/dL — ABNORMAL HIGH (ref 65–99)
Glucose-Capillary: 152 mg/dL — ABNORMAL HIGH (ref 65–99)
Glucose-Capillary: 163 mg/dL — ABNORMAL HIGH (ref 65–99)
Glucose-Capillary: 188 mg/dL — ABNORMAL HIGH (ref 65–99)

## 2016-03-11 MED ORDER — INSULIN ASPART 100 UNIT/ML ~~LOC~~ SOLN: 16.0000 [IU] | Freq: Three times a day (TID) | SUBCUTANEOUS | 11 refills | Status: DC

## 2016-03-11 MED ORDER — INSULIN ASPART 100 UNIT/ML ~~LOC~~ SOLN
16.0000 [IU] | Freq: Three times a day (TID) | SUBCUTANEOUS | Status: DC
  Administered 2016-03-11: 16 [IU] via SUBCUTANEOUS

## 2016-03-11 MED ORDER — INSULIN GLARGINE 100 UNIT/ML ~~LOC~~ SOLN: 30.0000 [IU] | Freq: Every day | SUBCUTANEOUS | 5 refills | Status: DC

## 2016-03-11 NOTE — Discharge Summary (Signed)
Physician Discharge Summary  Sonia Skinner MRN:[id number] DOB: 10/28/1950 DOA: 03/07/2016  PCP: Valerie A Keck, NP  Admit date: 03/07/2016 Discharge date: 03/11/2016  Time spent: 40 minutes  Recommendations for Outpatient Follow-up:   Hyperosmolar Hyperglycemic non-ketotic state/Type 2 diabetes mellitus uncontrolled with complications -Lantus to 30 U -NovoLog 16 units QAC -Patient has been instructed to keep a journal of all foods consumed during a day, and obtain her fingerstick blood sugar before each meal and before bedtime and record in journal.    -Schedule follow-up with NP Valerie Keck for HHNS in one week schedule: Medication adjustment.  Essential hypertension - Reasonably well controlled on no medication. Will allow PCP to add medication.    Hypothyroidism - Synthroid 112 g daily    Depression - stable   AKI (acute kidney injury) ([facility]) - pre renal azotemia from HONK state Recent Labs       Lab Results  Component Value Date   CREATININE 0.99 03/10/2016   CREATININE 1.05 (H) 03/09/2016   CREATININE 1.33 (H) 03/08/2016    -Resolved  Hyponatremia - Resolved  Hypokalemia -Resolved      Discharge Diagnoses:  Principal Problem:   Hyperosmolar non-ketotic state in patient with type 2 diabetes mellitus ([facility]) Active Problems:   Essential hypertension, benign   Hypothyroidism   Tobacco abuse   Depression   Hyperglycemia   GERD (gastroesophageal reflux disease)   AKI (acute kidney injury) ([facility])   Hx of medication noncompliance   Uncontrolled type 2 diabetes mellitus with complication ([facility])   Discharge Condition: Stable  Diet recommendation: American diabetic Association  Filed Weights   03/07/16 2028 03/08/16 0100  Weight: 56.7 kg (125 lb) 54.1 kg (119 lb 4.3 oz)    History of present illness:  65 y.o.BF PMHx  Anxiety/Depression,DM Type  2,, HTN, CVA;  who presentedwith fatigue, confusion, poor oral intake, nausea, muscle twitches. In ED,  CBG severely elevated and pt admitted for further evaluation.  During this hospitalization patient was treated for Hyperosmolar Hyperglycemic Nonketotic State, associated with acute kidney injury. Patient was placed on glucose stabilizer protocol and responded well.       Discharge Exam: Vitals:   03/11/16 0400 03/11/16 0600 03/11/16 0745 03/11/16 1136  BP: (!) 121/56 (!) 131/58 (!) 146/84 (!) 134/58  Pulse:   76 86  Resp: 13 12  13  Temp:   98.1 F (36.7 C) 98.6 F (37 C)  TempSrc:   Oral Oral  SpO2:      Weight:      Height:        General: A/O 4, NAD., No acute respiratory distress Eyes: negative scleral hemorrhage, negative anisocoria, negative icterus ENT: Negative Runny nose, negative gingival bleeding, Neck:  Negative scars, masses, torticollis, lymphadenopathy, JVD Lungs: Clear to auscultation bilaterally without wheezes or crackles Cardiovascular: Regular rate and rhythm without murmur gallop or rub normal S1 and S2   Discharge Instructions     Medication List    STOP taking these medications   amitriptyline 50 MG tablet Commonly known as:  ELAVIL   amLODipine 5 MG tablet Commonly known as:  NORVASC   desloratadine 5 MG tablet Commonly known as:  CLARINEX   escitalopram 20 MG tablet Commonly known as:  LEXAPRO   Liraglutide 18 MG/3ML Sopn Commonly known as:  VICTOZA   lisinopril-hydrochlorothiazide 20-25 MG tablet Commonly known as:  PRINZIDE,ZESTORETIC     TAKE these medications   ACCU-CHEK AVIVA PLUS w/Device Kit ACHS for E11.9   accu-chek softclix   lancets Check blood sugar ACHS for E11.9   ACCU-CHEK SOFTCLIX LANCETS lancets ACHS for E11.9   albuterol 108 (90 Base) MCG/ACT inhaler Commonly known as:  PROVENTIL HFA;VENTOLIN HFA Inhale 1-2 puffs into the lungs every 6 (six) hours as needed for wheezing or shortness of breath.   aspirin 325 MG tablet Take 1 tablet (325 mg total) by mouth daily.   atorvastatin 40 MG tablet Commonly  known as:  LIPITOR TAKE 1 TABLET BY MOUTH DAILY AT 6 PM.   glucose blood test strip Commonly known as:  ACCU-CHEK AVIVA Check blood sugar AC and qHS for E11.9   insulin aspart 100 UNIT/ML injection Commonly known as:  novoLOG Inject 16 Units into the skin 3 (three) times daily with meals.   insulin glargine 100 UNIT/ML injection Commonly known as:  LANTUS Inject 0.3 mLs (30 Units total) into the skin at bedtime. What changed:  how much to take   INSULIN SYRINGE .5CC/28G 28G X 1/2" 0.5 ML Misc Give insulin once per night   levothyroxine 112 MCG tablet Commonly known as:  SYNTHROID, LEVOTHROID Take 112 mcg by mouth daily.   loratadine 10 MG tablet Commonly known as:  CLARITIN TAKE ONE TABLET BY MOUTH ONCE DAILY What changed:  See the new instructions.   omeprazole 40 MG capsule Commonly known as:  PRILOSEC TAKE ONE CAPSULE BY MOUTH ONCE DAILY      Allergies  Allergen Reactions  . Anesthetics, Ester Anaphylaxis   Follow-up Information    Lance Bosch, NP. Schedule an appointment as soon as possible for a visit in 1 week(s).   Specialty:  Internal Medicine Why:  Schedule follow-up with NP Chari Manning for HHNS in one week: Medication adjustment           The results of significant diagnostics from this hospitalization (including imaging, microbiology, ancillary and laboratory) are listed below for reference.    Significant Diagnostic Studies: No results found.  Microbiology: Recent Results (from the past 240 hour(s))  MRSA PCR Screening     Status: Abnormal   Collection Time: 03/08/16  2:34 AM  Result Value Ref Range Status   MRSA by PCR POSITIVE (A) NEGATIVE Final    Comment:        The GeneXpert MRSA Assay (FDA approved for NASAL specimens only), is one component of a comprehensive MRSA colonization surveillance program. It is not intended to diagnose MRSA infection nor to guide or monitor treatment for MRSA infections. RESULT CALLED TO, READ BACK  BY AND VERIFIED WITH: WHITLOW,A RN [id number] 03/08/16 MITCHELL,L   Culture, Urine     Status: Abnormal   Collection Time: 03/08/16  4:24 AM  Result Value Ref Range Status   Specimen Description URINE, CLEAN CATCH  Final   Special Requests NONE  Final   Culture MULTIPLE SPECIES PRESENT, SUGGEST RECOLLECTION (A)  Final   Report Status 03/09/2016 FINAL  Final     Labs: Basic Metabolic Panel:  Recent Labs Lab 03/07/16 2223 03/07/16 2237 03/08/16 0149 03/08/16 0701 03/09/16 0249 03/10/16 0241  NA 121* 121* 134* 136 136 135  K 4.9 5.1 3.0* 3.7 3.5 4.5  CL 83* 85* 100* 106 111 108  CO2 24  --  _0 GLUCOSE 1,003* >700* 332* 182* 146* 341*  BUN 39* 50* 32* 25* 12 6  CREATININE 2.38* 2.20* 1.82* 1.33* 1.05* 0.99  CALCIUM 9.1  --  9.1 8.4* 8.1* 8.3*  MG  --   --  2.4  --   --   --  Liver Function Tests:  Recent Labs Lab 03/07/16 2223  AST 11*  ALT 13*  ALKPHOS 182*  BILITOT 0.5  PROT 7.3  ALBUMIN 3.7   No results for input(s): LIPASE, AMYLASE in the last 168 hours. No results for input(s): AMMONIA in the last 168 hours. CBC:  Recent Labs Lab 03/07/16 2237 03/08/16 0056 03/09/16 0249 03/10/16 0241  WBC  --  11.0* 10.5 9.6  NEUTROABS  --  6.1  --   --   HGB 13.6 11.6* 10.5* 9.7*  HCT 40.0 35.2* 32.8* 29.6*  MCV  --  82.4 84.3 84.6  PLT  --  175 168 161   Cardiac Enzymes: No results for input(s): CKTOTAL, CKMB, CKMBINDEX, TROPONINI in the last 168 hours. BNP: BNP (last 3 results) No results for input(s): BNP in the last 8760 hours.  ProBNP (last 3 results) No results for input(s): PROBNP in the last 8760 hours.  CBG:  Recent Labs Lab 03/10/16 1952 03/11/16 0026 03/11/16 0346 03/11/16 0751 03/11/16 1139  GLUCAP 149* 163* 152* 138* 188*       Signed:  Dia Crawford, MD [facility] [phone] pager

## 2016-03-11 NOTE — Discharge Instructions (Signed)
Blood Glucose Monitoring, Adult ° °Monitoring your blood glucose (also know as blood sugar) helps you to manage your diabetes. It also helps you and your health care provider monitor your diabetes and determine how well your treatment plan is working. °WHY SHOULD YOU MONITOR YOUR BLOOD GLUCOSE? °· It can help you understand how food, exercise, and medicine affect your blood glucose. °· It allows you to know what your blood glucose is at any given moment. You can quickly tell if you are having low blood glucose (hypoglycemia) or high blood glucose (hyperglycemia). °· It can help you and your health care provider know how to adjust your medicines. °· It can help you understand how to manage an illness or adjust medicine for exercise. °WHEN SHOULD YOU TEST? °Your health care provider will help you decide how often you should check your blood glucose. This may depend on the type of diabetes you have, your diabetes control, or the types of medicines you are taking. Be sure to write down all of your blood glucose readings so that this information can be reviewed with your health care provider. See below for examples of testing times that your health care provider may suggest. °Type 1 Diabetes °· Test at least 2 times per day if your diabetes is well controlled, if you are using an insulin pump, or if you perform multiple daily injections. °· If your diabetes is not well controlled or if you are sick, you may need to test more often. °· It is a good idea to also test: °¨ Before every insulin injection. °¨ Before and after exercise. °¨ Between meals and 2 hours after a meal. °¨ Occasionally between 2:00 a.m. and 3:00 a.m. °Type 2 Diabetes °· If you are taking insulin, test at least 2 times per day. However, it is best to test before every insulin injection. °· If you take medicines by mouth (orally), test 2 times a day. °· If you are on a controlled diet, test once a day. °· If your diabetes is not well controlled or if you  are sick, you may need to monitor more often. °HOW TO MONITOR YOUR BLOOD GLUCOSE °Supplies Needed °· Blood glucose meter. °· Test strips for your meter. Each meter has its own strips. You must use the strips that go with your own meter. °· A pricking needle (lancet). °· A device that holds the lancet (lancing device). °· A journal or log book to write down your results. °Procedure °· Wash your hands with soap and water. Alcohol is not preferred. °· Prick the side of your finger (not the tip) with the lancet. °· Gently milk the finger until a small drop of blood appears. °· Follow the instructions that come with your meter for inserting the test strip, applying blood to the strip, and using your blood glucose meter. °Other Areas to Get Blood for Testing °Some meters allow you to use other areas of your body (other than your finger) to test your blood. These areas are called alternative sites. The most common alternative sites are: °· The forearm. °· The thigh. °· The back area of the lower leg. °· The palm of the hand. °The blood flow in these areas is slower. Therefore, the blood glucose values you get may be delayed, and the numbers are different from what you would get from your fingers. Do not use alternative sites if you think you are having hypoglycemia. Your reading will not be accurate. Always use a finger if you   are having hypoglycemia. Also, if you cannot feel your lows (hypoglycemia unawareness), always use your fingers for your blood glucose checks. °ADDITIONAL TIPS FOR GLUCOSE MONITORING °· Do not reuse lancets. °· Always carry your supplies with you. °· All blood glucose meters have a 24-hour "hotline" number to call if you have questions or need help. °· Adjust (calibrate) your blood glucose meter with a control solution after finishing a few boxes of strips. °BLOOD GLUCOSE RECORD KEEPING °It is a good idea to keep a daily record or log of your blood glucose readings. Most glucose meters, if not all,  keep your glucose records stored in the meter. Some meters come with the ability to download your records to your home computer. Keeping a record of your blood glucose readings is especially helpful if you are wanting to look for patterns. Make notes to go along with the blood glucose readings because you might forget what happened at that exact time. Keeping good records helps you and your health care provider to work together to achieve good diabetes management.  °  °This information is not intended to replace advice given to you by your health care provider. Make sure you discuss any questions you have with your health care provider. °  °Document Released: 08/05/2003 Document Revised: 08/23/2014 Document Reviewed: 12/25/2012 °Elsevier Interactive Patient Education ©2016 [employer] ° °

## 2016-03-12 NOTE — Care Management Important Message (Signed)
Important Message  Patient Details  Name: Sonia Skinner MRN: [id number] Date of Birth: 1950-10-06   Medicare Important Message Given:  Yes   LATE ENTRY:  IM given to pt on 03/11/16    Girard Cooter, RN 03/12/2016, 9:11 AM

## 2016-03-16 ENCOUNTER — Ambulatory Visit (HOSPITAL_COMMUNITY): Payer: Self-pay | Admitting: Psychology

## 2016-03-17 ENCOUNTER — Other Ambulatory Visit: Payer: Self-pay | Admitting: Internal Medicine

## 2016-03-19 ENCOUNTER — Encounter (HOSPITAL_BASED_OUTPATIENT_CLINIC_OR_DEPARTMENT_OTHER): Payer: Self-pay

## 2016-03-19 ENCOUNTER — Emergency Department (HOSPITAL_BASED_OUTPATIENT_CLINIC_OR_DEPARTMENT_OTHER)
Admission: EM | Admit: 2016-03-19 | Discharge: 2016-03-19 | Disposition: A | Discharge status: Home / Self Care | Payer: Medicare Other | Attending: Emergency Medicine | Admitting: Emergency Medicine

## 2016-03-19 DIAGNOSIS — Z7982 Long term (current) use of aspirin: Secondary | ICD-10-CM | POA: Diagnosis not present

## 2016-03-19 DIAGNOSIS — Z79899 Other long term (current) drug therapy: Secondary | ICD-10-CM | POA: Insufficient documentation

## 2016-03-19 DIAGNOSIS — Z87891 Personal history of nicotine dependence: Secondary | ICD-10-CM | POA: Diagnosis not present

## 2016-03-19 DIAGNOSIS — M7989 Other specified soft tissue disorders: Secondary | ICD-10-CM | POA: Diagnosis present

## 2016-03-19 DIAGNOSIS — E119 Type 2 diabetes mellitus without complications: Secondary | ICD-10-CM | POA: Insufficient documentation

## 2016-03-19 DIAGNOSIS — R600 Localized edema: Secondary | ICD-10-CM | POA: Insufficient documentation

## 2016-03-19 DIAGNOSIS — Z794 Long term (current) use of insulin: Secondary | ICD-10-CM | POA: Diagnosis not present

## 2016-03-19 DIAGNOSIS — I10 Essential (primary) hypertension: Secondary | ICD-10-CM | POA: Insufficient documentation

## 2016-03-19 LAB — BRAIN NATRIURETIC PEPTIDE: B Natriuretic Peptide: 131.4 pg/mL — ABNORMAL HIGH (ref 0.0–100.0)

## 2016-03-19 LAB — BASIC METABOLIC PANEL
Anion gap: 7 (ref 5–15)
BUN: 9 mg/dL (ref 6–20)
CO2: 27 mmol/L (ref 22–32)
Calcium: 8.7 mg/dL — ABNORMAL LOW (ref 8.9–10.3)
Chloride: 105 mmol/L (ref 101–111)
Creatinine, Ser: 0.8 mg/dL (ref 0.44–1.00)
GFR calc Af Amer: >60 mL/min (ref >60)
GFR calc non Af Amer: >60 mL/min (ref >60)
Glucose, Bld: 105 mg/dL — ABNORMAL HIGH (ref 65–99)
Potassium: 3.5 mmol/L (ref 3.5–5.1)
Sodium: 139 mmol/L (ref 135–145)

## 2016-03-19 MED ORDER — TRAMADOL HCL 50 MG PO TABS
50.0000 mg | ORAL_TABLET | Freq: Once | ORAL | Status: AC
  Administered 2016-03-19: 50 mg via ORAL
  Filled 2016-03-19: qty 1

## 2016-03-19 NOTE — ED Notes (Signed)
Pt made aware to return if symptoms worsen or if any life threatening symptoms occur.   

## 2016-03-19 NOTE — ED Provider Notes (Signed)
[facility] DEPT [facility] Provider Note   CSN: [id number] Arrival date & time: 03/19/16  1139  First Provider Contact:  None       History   Chief Complaint Chief Complaint  Patient presents with  . Leg Swelling    HPI Sonia Skinner is a 65 y.o. female.  Patient presents today complaining of bilateral pain and swelling to her feet.  She reports onset last evening and states that the swelling has gradually worsened since that time.  She denies any injury or trauma to the feet.  She denies standing for a long period of time.  She describes the pain as a feeling like she is "walking on balloons." She also states that she feels a tingling feeling to the bottom of both feet.  She was recently hospitalized from 7/23-7/27 for Hyperosmolar Hyperglycemic Non Ketotic state with a blood glucose over 1000 at that time.  She states that her blood sugars have been well controlled since she was discharged.  She reports a CBG of 139 this morning.  She has been taking her insulin as directed.  She states that during that hospitalization she was given prophylactic Heparin to prevent DVT.  She has not attempted to elevate her feet to reduce the swelling.  She is not on any diuretics.  No history of CHF.  Echo from 2 years ago reviewed, which showed an EF of 60-65%.  She denies any chest pain or SOB.  No fever or chills.        Past Medical History:  Diagnosis Date  . Anxiety   . Carotid artery occlusion   . Complication of anesthesia    ALLERY TO ESTER BASE  . Depression    early 101s  . Diabetes mellitus    INSULIN DEPENDENT  . GERD (gastroesophageal reflux disease)   . Headache   . Hypertension   . Stroke [facility]) March 2015    left MCA infarct  . Thyroid disease     Patient Active Problem List   Diagnosis Date Noted  . Uncontrolled type 2 diabetes mellitus with complication ([place])   . Hyperosmolar non-ketotic state in patient with type 2 diabetes mellitus ([place]) 03/08/2016  . GERD  (gastroesophageal reflux disease) 03/08/2016  . AKI (acute kidney injury) ([place])   . Hx of medication noncompliance   . Hyperglycemia 03/07/2016  . Major depressive disorder, recurrent episode ([place]) 02/20/2016  . Keloid scar-Left neck post-op site 12/19/2013  . Tightness of neck-Left 12/19/2013  . Pain in limb-Left neck 12/19/2013  . CVA (cerebral infarction) 11/25/2013  . Occlusion and stenosis of carotid artery with cerebral infarction 11/25/2013  . Left sided numbness 11/25/2013  . Depression 11/25/2013  . Aftercare following surgery of the circulatory system, [place] 11/21/2013  . Occlusion and stenosis of carotid artery without mention of cerebral infarction- Left 11/21/2013  . Follow-up examination, following unspecified surgery 11/21/2013  . Carotid stenosis 11/03/2013  . Parotid mass 11/02/2013  . Acute ischemic left MCA stroke ([place]) 11/02/2013  . Type II or unspecified type diabetes mellitus with neurological manifestations, not stated as uncontrolled 11/02/2013  . CVA (cerebral vascular accident) ([place]) 11/01/2013  . TIA (transient ischemic attack) 11/01/2013  . Unspecified vitamin D deficiency 10/19/2013  . Tooth pain 07/31/2013  . Tobacco abuse 07/31/2013  . Essential hypertension, benign 04/20/2013  . DM type 2 (diabetes mellitus, type 2) ([place]) 04/20/2013  . Hypothyroidism 04/20/2013  . Other and unspecified hyperlipidemia 04/20/2013    Past Surgical History:  Procedure Laterality  Date  . ABDOMINAL HYSTERECTOMY    . ANTERIOR FIXATION AND POSTERIOR MICRODISCECTOMY CERVICAL SPINE  1999  . CAROTID ENDARTERECTOMY Left 11-04-13   cea  . ENDARTERECTOMY Left 11/04/2013    OB History    No data available       Home Medications    Prior to Admission medications   Medication Sig Start Date End Date Taking? Authorizing Provider  ACCU-CHEK SOFTCLIX LANCETS lancets ACHS for E11.9 03/25/15   Lance Bosch, NP  albuterol (PROVENTIL HFA;VENTOLIN HFA) 108 (90 Base) MCG/ACT  inhaler Inhale 1-2 puffs into the lungs every 6 (six) hours as needed for wheezing or shortness of breath.    Historical Provider, MD  aspirin 325 MG tablet Take 1 tablet (325 mg total) by mouth daily. 11/06/13   Samella Parr, NP  atorvastatin (LIPITOR) 40 MG tablet TAKE 1 TABLET BY MOUTH DAILY AT 6 PM. 08/14/15   Lance Bosch, NP  Blood Glucose Monitoring Suppl (ACCU-CHEK AVIVA PLUS) w/Device KIT ACHS for E11.9 08/14/15   Lance Bosch, NP  glucose blood (ACCU-CHEK AVIVA) test strip Check blood sugar AC and qHS for E11.9 01/23/16   Tresa Garter, MD  insulin aspart (NOVOLOG) 100 UNIT/ML injection Inject 16 Units into the skin 3 (three) times daily with meals. 03/11/16   Allie Bossier, MD  insulin glargine (LANTUS) 100 UNIT/ML injection Inject 0.3 mLs (30 Units total) into the skin at bedtime. 03/11/16   Allie Bossier, MD  INSULIN SYRINGE .5CC/28G 28G X 1/2" 0.5 ML MISC Give insulin once per night 08/14/15   Lance Bosch, NP  Lancet Devices [facility]) lancets Check blood sugar ACHS for E11.9 08/14/15   Lance Bosch, NP  levothyroxine (SYNTHROID, LEVOTHROID) 112 MCG tablet Take 112 mcg by mouth daily. 08/19/15   Historical Provider, MD  loratadine (CLARITIN) 10 MG tablet TAKE ONE TABLET BY MOUTH ONCE DAILY Patient taking differently: TAKE ONE TABLET BY MOUTH ONCE DAILY AS NEEDED FOR ALLERGIES 01/19/16   Tresa Garter, MD  omeprazole (PRILOSEC) 40 MG capsule TAKE ONE CAPSULE BY MOUTH ONCE DAILY 02/26/16   Tresa Garter, MD    Family History Family History  Problem Relation Age of Onset  . Diabetes Mother   . Heart disease Mother     Before age 55  . Cancer Father     Lung  . Hypertension Sister   . Diabetes Sister   . Diabetes Sister   . Diabetes Sister     Social History Social History  Substance Use Topics  . Smoking status: Former Smoker    Packs/day: 0.20    Years: 20.00    Types: Cigarettes    Quit date: 11/01/2013  . Smokeless tobacco: Never Used    . Alcohol use No     Allergies   Anesthetics, ester   Review of Systems Review of Systems  All other systems reviewed and are negative.    Physical Exam Updated Vital Signs BP 162/84 (BP Location: Left Arm)   Pulse 108   Temp 98.5 F (36.9 C) (Oral)   Resp 22   Ht '5\' 2"'$  (1.575 m)   Wt 63.7 kg   SpO2 98%   BMI 25.70 kg/m   Physical Exam  Constitutional: She appears well-developed and well-nourished.  HENT:  Head: Normocephalic and atraumatic.  Neck: Normal range of motion. Neck supple.  Cardiovascular: Normal rate and regular rhythm.   Pulses:      Dorsalis pedis pulses are  2+ on the right side, and 2+ on the left side.  Pulmonary/Chest: Effort normal and breath sounds normal. She has no rales.  Musculoskeletal: Normal range of motion.  1+ pitting edema of the feet and ankles bilaterally.  No erythema or warmth of the feet or ankles.  Negative Homan's sign bilaterally  Neurological: She is alert.  Skin: Skin is warm and dry.  Psychiatric: She has a normal mood and affect.  Nursing note and vitals reviewed.    ED Treatments / Results  Labs (all labs ordered are listed, but only abnormal results are displayed) Labs Reviewed  BRAIN NATRIURETIC PEPTIDE - Abnormal; Notable for the following:       Result Value   B Natriuretic Peptide 131.4 (*)    All other components within normal limits  BASIC METABOLIC PANEL - Abnormal; Notable for the following:    Glucose, Bld 105 (*)    Calcium 8.7 (*)    All other components within normal limits    EKG  EKG Interpretation None       Radiology No results found.  Procedures Procedures (including critical care time)  Medications Ordered in ED Medications - No data to display   Initial Impression / Assessment and Plan / ED Course  I have reviewed the triage vital signs and the nursing notes.  Pertinent labs & imaging results that were available during my care of the patient were reviewed by me and  considered in my medical decision making (see chart for details).  Clinical Course      Final Clinical Impressions(s) / ED Diagnoses   Final diagnoses:  None   Patient presents today with bilateral swelling of her feet and ankles.  No history of CHF.  Creatine WNL today.  BNP is 131.  She was recently hospitalized for a blood sugar of over 1000 and was given IV fluid boluses during that admission.  Suspect that this may be contributing to the peripheral edema.  She denies any CP or SOB.  Patient stable for discharge.  She is instructed to elevate her feet and wear compression stockings.  Stable for discharge.  Return precautions given.   Patient also evaluated by Dr Maryan Rued who is in agreement with the plan.  New Prescriptions New Prescriptions   No medications on file     Hyman Bible, PA-C 03/21/16 0001    Hyman Bible, PA-C 03/21/16 0001    Blanchie Dessert, MD 03/28/16 2123

## 2016-03-19 NOTE — Discharge Instructions (Signed)
Elevate your feet above the level of your heart.  Use compression stockings daily.

## 2016-03-19 NOTE — ED Triage Notes (Signed)
C/o bilat LE swelling started last night-NAD-steady gait-refused w/c

## 2016-03-25 ENCOUNTER — Encounter: Payer: Self-pay | Admitting: Vascular Surgery

## 2016-03-26 ENCOUNTER — Other Ambulatory Visit: Payer: Self-pay | Admitting: Internal Medicine

## 2016-03-31 ENCOUNTER — Ambulatory Visit (INDEPENDENT_AMBULATORY_CARE_PROVIDER_SITE_OTHER): Payer: Medicare Other | Admitting: Vascular Surgery

## 2016-03-31 ENCOUNTER — Encounter: Payer: Self-pay | Admitting: Vascular Surgery

## 2016-03-31 ENCOUNTER — Ambulatory Visit (HOSPITAL_COMMUNITY)
Admission: RE | Admit: 2016-03-31 | Discharge: 2016-03-31 | Disposition: A | Discharge status: Home / Self Care | Payer: Medicare Other | Source: Ambulatory Visit | Attending: Vascular Surgery | Admitting: Vascular Surgery

## 2016-03-31 VITALS — BP 169/78 | HR 98 | Ht 62.0 in | Wt 137.4 lb

## 2016-03-31 DIAGNOSIS — I2510 Atherosclerotic heart disease of native coronary artery without angina pectoris: Secondary | ICD-10-CM | POA: Insufficient documentation

## 2016-03-31 DIAGNOSIS — F329 Major depressive disorder, single episode, unspecified: Secondary | ICD-10-CM | POA: Insufficient documentation

## 2016-03-31 DIAGNOSIS — E079 Disorder of thyroid, unspecified: Secondary | ICD-10-CM | POA: Diagnosis not present

## 2016-03-31 DIAGNOSIS — F419 Anxiety disorder, unspecified: Secondary | ICD-10-CM | POA: Insufficient documentation

## 2016-03-31 DIAGNOSIS — I6529 Occlusion and stenosis of unspecified carotid artery: Secondary | ICD-10-CM

## 2016-03-31 DIAGNOSIS — K219 Gastro-esophageal reflux disease without esophagitis: Secondary | ICD-10-CM | POA: Diagnosis not present

## 2016-03-31 DIAGNOSIS — I10 Essential (primary) hypertension: Secondary | ICD-10-CM | POA: Diagnosis not present

## 2016-03-31 DIAGNOSIS — E119 Type 2 diabetes mellitus without complications: Secondary | ICD-10-CM | POA: Insufficient documentation

## 2016-03-31 DIAGNOSIS — I6522 Occlusion and stenosis of left carotid artery: Secondary | ICD-10-CM

## 2016-03-31 LAB — VAS US CAROTID
LEFT ECA DIAS: -17 cm/s
LEFT VERTEBRAL DIAS: 18 cm/s
Left CCA dist dias: -16 cm/s
Left CCA dist sys: -69 cm/s
Left CCA prox dias: 16 cm/s
Left CCA prox sys: 122 cm/s
Left ICA dist dias: -39 cm/s
Left ICA dist sys: -102 cm/s
Left ICA prox dias: -31 cm/s
Left ICA prox sys: -74 cm/s
RIGHT CCA MID DIAS: -12 cm/s
RIGHT ECA DIAS: 25 cm/s
Right CCA prox dias: 22 cm/s
Right CCA prox sys: 101 cm/s
Right cca dist sys: 90 cm/s

## 2016-03-31 NOTE — Progress Notes (Signed)
Patient name: Sonia Skinner MRN: [id number] DOB: 31-Aug-1950 Sex: female  REASON FOR VISIT: Follow up of carotid disease.  HPI: Sonia Skinner is a 65 y.o. female who I last saw on 11/13/2014. She underwent a left carotid endarterectomy for a symptomatic left carotid stenosis in March 2015. She also had carotid siphon disease bilaterally. When I saw her last her left carotid endarterectomy site was widely patent. Less than 50% right carotid stenosis. I recommend a follow up study in 1 year and she returns for that visit.  Since I saw her last, she denies any history of stroke, TIAs, expressive or receptive aphasia, or amaurosis fugax. She is on aspirin and is on a statin.  Note, she was hospitalized in July of this year with hyperosmolar hyperglycemic nonketotic state with uncontrolled type 2 diabetes.  Past Medical History:  Diagnosis Date  . Anxiety   . Carotid artery occlusion   . Complication of anesthesia    ALLERY TO ESTER BASE  . Depression    early 50s  . Diabetes mellitus    INSULIN DEPENDENT  . GERD (gastroesophageal reflux disease)   . Headache   . Hypertension   . Stroke [facility]) March 2015    left MCA infarct  . Thyroid disease     Family History  Problem Relation Age of Onset  . Diabetes Mother   . Heart disease Mother     Before age 32  . Cancer Father     Lung  . Hypertension Sister   . Diabetes Sister   . Diabetes Sister   . Diabetes Sister     SOCIAL HISTORY: Social History  Substance Use Topics  . Smoking status: Former Smoker    Packs/day: 0.20    Years: 20.00    Types: Cigarettes    Quit date: 11/01/2013  . Smokeless tobacco: Never Used  . Alcohol use No    Allergies  Allergen Reactions  . Anesthetics, Ester Anaphylaxis    Current Outpatient Prescriptions  Medication Sig Dispense Refill  . ACCU-CHEK SOFTCLIX LANCETS lancets ACHS for E11.9 100 each 12  . albuterol (PROVENTIL HFA;VENTOLIN HFA) 108 (90 Base) MCG/ACT inhaler Inhale 1-2 puffs into  the lungs every 6 (six) hours as needed for wheezing or shortness of breath.    Marland Kitchen aspirin 325 MG tablet Take 1 tablet (325 mg total) by mouth daily.    Marland Kitchen atorvastatin (LIPITOR) 40 MG tablet TAKE 1 TABLET BY MOUTH DAILY AT 6 PM. 30 tablet 4  . Blood Glucose Monitoring Suppl (ACCU-CHEK AVIVA PLUS) w/Device KIT ACHS for E11.9 1 kit 0  . glucose blood (ACCU-CHEK AVIVA) test strip Check blood sugar AC and qHS for E11.9 100 each 12  . insulin aspart (NOVOLOG) 100 UNIT/ML injection Inject 16 Units into the skin 3 (three) times daily with meals. 10 mL 11  . insulin glargine (LANTUS) 100 UNIT/ML injection Inject 0.3 mLs (30 Units total) into the skin at bedtime. 10 mL 5  . INSULIN SYRINGE .5CC/28G 28G X 1/2" 0.5 ML MISC Give insulin once per night 100 each 12  . Lancet Devices (ACCU-CHEK SOFTCLIX) lancets Check blood sugar ACHS for E11.9 1 each 11  . levothyroxine (SYNTHROID, LEVOTHROID) 112 MCG tablet Take 112 mcg by mouth daily.    Marland Kitchen loratadine (CLARITIN) 10 MG tablet TAKE ONE TABLET BY MOUTH ONCE DAILY (Patient taking differently: TAKE ONE TABLET BY MOUTH ONCE DAILY AS NEEDED FOR ALLERGIES) 30 tablet 2  . omeprazole (PRILOSEC) 40 MG capsule TAKE  ONE CAPSULE BY MOUTH ONCE DAILY 30 capsule 0   No current facility-administered medications for this visit.     REVIEW OF SYSTEMS:  [X] denotes positive finding, [ ] denotes negative finding Cardiac  Comments:  Chest pain or chest pressure:    Shortness of breath upon exertion:    Short of breath when lying flat:    Irregular heart rhythm:        Vascular    Pain in calf, thigh, or hip brought on by ambulation:    Pain in feet at night that wakes you up from your sleep:     Blood clot in your veins:    Leg swelling:  X       Pulmonary    Oxygen at home:    Productive cough:     Wheezing:         Neurologic    Sudden weakness in arms or legs:     Sudden numbness in arms or legs:     Sudden onset of difficulty speaking or slurred speech:      Temporary loss of vision in one eye:     Problems with dizziness:         Gastrointestinal    Blood in stool:     Vomited blood:         Genitourinary    Burning when urinating:     Blood in urine:        Psychiatric    Major depression:         Hematologic    Bleeding problems:    Problems with blood clotting too easily:        Skin    Rashes or ulcers:        Constitutional    Fever or chills:      PHYSICAL EXAM: Vitals:   03/31/16 1436 03/31/16 1437  BP: (!) 176/82 (!) 169/78  Pulse: 98   SpO2: 100%   Weight: 137 lb 6.4 oz (62.3 kg)   Height: 5' 2" (1.575 m)     GENERAL: The patient is a well-nourished female, in no acute distress. The vital signs are documented above. CARDIAC: There is a regular rate and rhythm.  VASCULAR: She has bilateral carotid bruits. Both feet are warm and well perfused. PULMONARY: There is good air exchange bilaterally without wheezing or rales. ABDOMEN: Soft and non-tender with normal pitched bowel sounds.  MUSCULOSKELETAL: There are no major deformities or cyanosis. NEUROLOGIC: No focal weakness or paresthesias are detected. SKIN: There are no ulcers or rashes noted. PSYCHIATRIC: The patient has a normal affect.  DATA:   CAROTID DUPLEX: I have independently interpreted her carotid duplex scan. The left carotid endarterectomy site is widely patent without evidence of restenosis. There is a less than 39% right carotid stenosis. Both vertebral arteries are patent with antegrade flow.  MEDICAL ISSUES:  STATUS POST LEFT CAROTID ENDARTERECTOMY: The patient is doing well status post left carotid endarterectomy. Her left carotid endarterectomy site is widely patent and she is asymptomatic. She has no significant carotid stenosis on the right. I have ordered a follow up carotid duplex scan in 1 year and I'll see her back at that time. She knows to call sooner if she has problems.   HYPERTENSION: The patient's initial blood pressure today  was elevated. We repeated this and this was still elevated. We have encouraged the patient to follow up with their primary care physician for management of their blood pressure. She is in  the process of trying to get a new primary care physician.  VQI: The patient is on aspirin and is on a statin.   Deitra Mayo [facility] [phone]

## 2016-04-01 NOTE — Addendum Note (Signed)
Addended by: Reola Calkins on: 04/01/2016 03:56 PM   Modules accepted: Orders

## 2016-04-05 ENCOUNTER — Other Ambulatory Visit: Payer: Self-pay | Admitting: Internal Medicine

## 2016-04-28 ENCOUNTER — Other Ambulatory Visit: Payer: Self-pay | Admitting: Internal Medicine

## 2016-05-05 ENCOUNTER — Encounter: Payer: Self-pay | Admitting: Endocrinology

## 2016-05-05 ENCOUNTER — Ambulatory Visit (INDEPENDENT_AMBULATORY_CARE_PROVIDER_SITE_OTHER): Payer: Medicare Other | Admitting: Endocrinology

## 2016-05-05 DIAGNOSIS — E041 Nontoxic single thyroid nodule: Secondary | ICD-10-CM

## 2016-05-05 DIAGNOSIS — I6522 Occlusion and stenosis of left carotid artery: Secondary | ICD-10-CM

## 2016-05-05 MED ORDER — INSULIN ASPART 100 UNIT/ML FLEXPEN: 8.0000 [IU] | PEN_INJECTOR | Freq: Three times a day (TID) | SUBCUTANEOUS | 11 refills | Status: DC

## 2016-05-05 MED ORDER — LIRAGLUTIDE 18 MG/3ML ~~LOC~~ SOPN: 1.8000 mg | PEN_INJECTOR | Freq: Every day | SUBCUTANEOUS | 11 refills | Status: DC

## 2016-05-05 MED ORDER — INSULIN GLARGINE 100 UNIT/ML ~~LOC~~ SOLN: 15.0000 [IU] | Freq: Every day | SUBCUTANEOUS | 5 refills | Status: DC

## 2016-05-05 MED ORDER — INSULIN GLARGINE 100 UNIT/ML SOLOSTAR PEN: 15.0000 [IU] | PEN_INJECTOR | Freq: Every day | SUBCUTANEOUS | 99 refills | Status: DC

## 2016-05-05 NOTE — Progress Notes (Signed)
Subjective:    Patient ID: Sonia Skinner, female    DOB: [birth year], 65 y.o.   MRN: [id number]  HPI pt states DM was dx'ed in 1962; she has mild neuropathy of the lower extremities; she had associated CVA; he has been on insulin since 2000; pt says her diet is just OK, but exercise is good; she has never had pancreatitis, severe hypoglycemia or DKA.  In 2017, she had nonketotic hyperosmolar hyperglycemic state.  She says this was caused by noncompliance due to depression (she is better now, since she is seeing psychologist).  She takes lantus and prn novolog (averages a total of approx 20-30 units per day).  She says cbg's vary from 91-300's.  It is in general higher as the day goes on.   Past Medical History:  Diagnosis Date  . Anxiety   . Carotid artery occlusion   . Complication of anesthesia    ALLERY TO ESTER BASE  . Depression    early 63s  . Diabetes mellitus    INSULIN DEPENDENT  . GERD (gastroesophageal reflux disease)   . Headache   . Hypertension   . Stroke [facility]) March 2015    left MCA infarct  . Thyroid disease     Past Surgical History:  Procedure Laterality Date  . ABDOMINAL HYSTERECTOMY    . ANTERIOR FIXATION AND POSTERIOR MICRODISCECTOMY CERVICAL SPINE  1999  . CAROTID ENDARTERECTOMY Left 11-04-13   cea  . ENDARTERECTOMY Left 11/04/2013    Social History   Social History  . Marital status: Legally Separated    Spouse name: N/A  . Number of children: 0  . Years of education: College   Occupational History  . [place] History Main Topics  . Smoking status: Former Smoker    Packs/day: 0.20    Years: 20.00    Types: Cigarettes    Quit date: 11/01/2013  . Smokeless tobacco: Never Used  . Alcohol use No  . Drug use: No  . Sexual activity: Not on file   Other Topics Concern  . Not on file   Social History Narrative   Patient lives at home with sister.   Caffeine Use: 2 cups daily; sodas occasionally    Current Outpatient  Prescriptions on File Prior to Visit  Medication Sig Dispense Refill  . ACCU-CHEK SOFTCLIX LANCETS lancets ACHS for E11.9 100 each 12  . albuterol (PROVENTIL HFA;VENTOLIN HFA) 108 (90 Base) MCG/ACT inhaler Inhale 1-2 puffs into the lungs every 6 (six) hours as needed for wheezing or shortness of breath.    Marland Kitchen aspirin 325 MG tablet Take 1 tablet (325 mg total) by mouth daily.    Marland Kitchen atorvastatin (LIPITOR) 40 MG tablet TAKE 1 TABLET BY MOUTH DAILY AT 6 PM. 30 tablet 4  . Blood Glucose Monitoring Suppl (ACCU-CHEK AVIVA PLUS) w/Device KIT ACHS for E11.9 1 kit 0  . glucose blood (ACCU-CHEK AVIVA) test strip Check blood sugar AC and qHS for E11.9 100 each 12  . INSULIN SYRINGE .5CC/28G 28G X 1/2" 0.5 ML MISC Give insulin once per night 100 each 12  . Lancet Devices (ACCU-CHEK SOFTCLIX) lancets Check blood sugar ACHS for E11.9 1 each 11  . levothyroxine (SYNTHROID, LEVOTHROID) 112 MCG tablet Take 112 mcg by mouth daily.    Marland Kitchen loratadine (CLARITIN) 10 MG tablet TAKE ONE TABLET BY MOUTH ONCE DAILY (Patient taking differently: TAKE ONE TABLET BY MOUTH ONCE DAILY AS NEEDED FOR ALLERGIES) 30 tablet 2  . omeprazole (  PRILOSEC) 40 MG capsule TAKE ONE CAPSULE BY MOUTH ONCE DAILY 30 capsule 0   No current facility-administered medications on file prior to visit.     Allergies  Allergen Reactions  . Anesthetics, Ester Anaphylaxis    Family History  Problem Relation Age of Onset  . Diabetes Mother   . Heart disease Mother     Before age 45  . Cancer Father     Lung  . Hypertension Sister   . Diabetes Sister   . Diabetes Sister   . Diabetes Sister     BP (!) 148/86   Pulse 95   Ht _0  (1.575 m)   Wt 138 lb (62.6 kg)   SpO2 97%   BMI 25.24 kg/m    Review of Systems denies blurry vision, chest pain, sob, n/v, urinary frequency, muscle cramps, excessive diaphoresis, memory loss, cold intolerance, and rhinorrhea.  She has intermittent headache and easy bruising.  She has gained weight since she  has been off the victoza     Objective:   Physical Exam VS: see vs page GEN: no distress HEAD: head: no deformity eyes: no periorbital swelling, no proptosis external nose and ears are normal mouth: no lesion seen NECK: 2 cm left thyroid nodule CHEST WALL: no deformity LUNGS: clear to auscultation CV: reg rate and rhythm, no murmur ABD: abdomen is soft, nontender.  no hepatosplenomegaly.  not distended.  no hernia MUSCULOSKELETAL: muscle bulk and strength are grossly normal.  no obvious joint swelling.  gait is normal and steady EXTEMITIES: no deformity.  no ulcer on the feet.  feet are of normal color and temp.  no edema PULSES: dorsalis pedis intact bilat.  no carotid bruit NEURO:  cn 2-12 grossly intact.   readily moves all 4's.  sensation is intact to touch on the feet.  SKIN:  Normal texture and temperature.  No rash or suspicious lesion is visible.   NODES:  None palpable at the neck PSYCH: alert, well-oriented.  Does not appear anxious nor depressed.   Lab Results  Component Value Date   CREATININE 0.80 03/19/2016   BUN 9 03/19/2016   NA 139 03/19/2016   K 3.5 03/19/2016   CL 105 03/19/2016   CO2 27 03/19/2016   Lab Results  Component Value Date   MICROALBUR 1.7 12/26/2014   A1c=11.4% Lab Results  Component Value Date   TSH 4.278 03/08/2016   i personally reviewed electrocardiogram tracing (11/25/15): Indication: dizziness.  Impression: low voltage.   I have reviewed outside records, and summarized: Pt was noted to have elevated a1c, and referred here.  She was seen in ER with edema.  At that time, pt reported cbg's were well-controlled.     Assessment & Plan:  Insulin-requiring type 2 DM, new to me. Severe exacerbation (however, she is prob is evolving type 1).   Weight gain: she wants to resume victoza.

## 2016-05-05 NOTE — Patient Instructions (Addendum)
good diet and exercise significantly improve the control of your diabetes.  please let me know if you wish to be referred to a dietician.  high blood sugar is very risky to your health.  you should see an eye doctor and dentist every year.  It is very important to get all recommended vaccinations.  Controlling your blood pressure and cholesterol drastically reduces the damage diabetes does to your body.  Those who smoke should quit.  Please discuss these with your doctor.  check your blood sugar twice a day.  vary the time of day when you check, between before the 3 meals, and at bedtime.  also check if you have symptoms of your blood sugar being too high or too low.  please keep a record of the readings and bring it to your next appointment here (or you can bring the meter itself).  You can write it on any piece of paper.  please call us sooner if your blood sugar goes below 70, or if you have a lot of readings over 200.  Let's check a thyroid ultrasound.  You will receive a phone call, about a day and time for an appointment.  We will need to take this complex situation in stages.   I have sent a prescription to your pharmacy, to resume the [place]. The side-effect is nausea, which goes away with time.  To avoid this side-effect, start with the lowest (0.6) setting.  After a few days, increase to 1.2.  If you still have little or no nausea, increase to the highest (1.8) setting, and continue that setting.   For now, please:  Please reduce the lantus to 15 units at bedtime, and:  Take novolog 8 units 3 times a day (just before each meal).   Please come back for a follow-up appointment in 2 weeks.

## 2016-05-19 ENCOUNTER — Ambulatory Visit (HOSPITAL_COMMUNITY): Payer: Self-pay | Admitting: Psychiatry

## 2016-05-22 ENCOUNTER — Other Ambulatory Visit: Payer: Self-pay | Admitting: Internal Medicine

## 2016-05-27 ENCOUNTER — Other Ambulatory Visit: Payer: Self-pay | Admitting: Internal Medicine

## 2016-05-27 ENCOUNTER — Other Ambulatory Visit (HOSPITAL_COMMUNITY): Payer: Self-pay | Admitting: Psychiatry

## 2016-05-27 DIAGNOSIS — F329 Major depressive disorder, single episode, unspecified: Secondary | ICD-10-CM

## 2016-05-27 DIAGNOSIS — F32A Depression, unspecified: Secondary | ICD-10-CM

## 2016-05-28 ENCOUNTER — Ambulatory Visit
Admission: RE | Admit: 2016-05-28 | Discharge: 2016-05-28 | Disposition: A | Discharge status: Home / Self Care | Payer: Medicare Other | Source: Ambulatory Visit | Attending: Endocrinology | Admitting: Endocrinology

## 2016-05-28 DIAGNOSIS — E041 Nontoxic single thyroid nodule: Secondary | ICD-10-CM

## 2016-05-31 ENCOUNTER — Encounter: Payer: Self-pay | Admitting: Endocrinology

## 2016-05-31 ENCOUNTER — Other Ambulatory Visit: Payer: Self-pay | Admitting: Internal Medicine

## 2016-05-31 ENCOUNTER — Ambulatory Visit (INDEPENDENT_AMBULATORY_CARE_PROVIDER_SITE_OTHER): Payer: Medicare Other | Admitting: Endocrinology

## 2016-05-31 VITALS — BP 158/72 | HR 92 | Ht 62.0 in | Wt 146.0 lb

## 2016-05-31 DIAGNOSIS — I6522 Occlusion and stenosis of left carotid artery: Secondary | ICD-10-CM | POA: Diagnosis not present

## 2016-05-31 DIAGNOSIS — E1141 Type 2 diabetes mellitus with diabetic mononeuropathy: Secondary | ICD-10-CM | POA: Diagnosis not present

## 2016-05-31 DIAGNOSIS — Z794 Long term (current) use of insulin: Secondary | ICD-10-CM | POA: Diagnosis not present

## 2016-05-31 MED ORDER — OMEPRAZOLE 40 MG PO CPDR: 40.0000 mg | DELAYED_RELEASE_CAPSULE | Freq: Every day | ORAL | 0 refills | Status: DC

## 2016-05-31 NOTE — Patient Instructions (Addendum)
check your blood sugar twice a day.  vary the time of day when you check, between before the 3 meals, and at bedtime.  also check if you have symptoms of your blood sugar being too high or too low.  please keep a record of the readings and bring it to your next appointment here (or you can bring the meter itself).  You can write it on any piece of paper.  please call us sooner if your blood sugar goes below 70, or if you have a lot of readings over 200.  Let's check the thyroid biopsy at a future date--just let me know.  It is easy.   please increase the victoza to the highest (1.8) setting, and continue that setting, and:  Please continue the same lantus (15 units at bedtime), and:  continue novolog, 8 units 3 times a day (just before each meal).   please call [phone], to get an appointment with a new primary doctor.  Until then, I have refilled the prilosec once.   Please come back for a follow-up appointment in 6 weeks.

## 2016-05-31 NOTE — Progress Notes (Signed)
Subjective:    Patient ID: Sonia Skinner, female    DOB: Aug 14, 1951, 65 y.o.   MRN: [id number]  HPI  Pt returns for f/u of diabetes mellitus: DM type: Insulin-requiring type 2 (but she is prob is evolving type 1) Dx'ed: 7017 Complications: polyneuropathy and CVA Therapy: insulin since 2000 GDM: never DKA: never Severe hypoglycemia: never Pancreatitis: never Other: in 2017, she had episode of nonketotic hyperosmolar hyperglycemic state Interval history: She is a Pharmacist, hospital, and has little time at lunch; no cbg record, but states cbg's vary from 128 (late morning)-200 (after a missed injection).  It is higher fasting than at HS (despite no HS-snack).  She takes victoza 1.2 mg/day.   Past Medical History:  Diagnosis Date  . Anxiety   . Carotid artery occlusion   . Complication of anesthesia    ALLERY TO ESTER BASE  . Depression    early 69s  . Diabetes mellitus    INSULIN DEPENDENT  . GERD (gastroesophageal reflux disease)   . Headache   . Hypertension   . Stroke [facility]) March 2015    left MCA infarct  . Thyroid disease     Past Surgical History:  Procedure Laterality Date  . ABDOMINAL HYSTERECTOMY    . ANTERIOR FIXATION AND POSTERIOR MICRODISCECTOMY CERVICAL SPINE  1999  . CAROTID ENDARTERECTOMY Left 11-04-13   cea  . ENDARTERECTOMY Left 11/04/2013    Social History   Social History  . Marital status: Legally Separated    Spouse name: N/A  . Number of children: 0  . Years of education: College   Occupational History  . [place] History Main Topics  . Smoking status: Former Smoker    Packs/day: 0.20    Years: 20.00    Types: Cigarettes    Quit date: 11/01/2013  . Smokeless tobacco: Never Used  . Alcohol use No  . Drug use: No  . Sexual activity: Not on file   Other Topics Concern  . Not on file   Social History Narrative   Patient lives at home with sister.   Caffeine Use: 2 cups daily; sodas occasionally    Current Outpatient  Prescriptions on File Prior to Visit  Medication Sig Dispense Refill  . ACCU-CHEK SOFTCLIX LANCETS lancets ACHS for E11.9 100 each 12  . albuterol (PROVENTIL HFA;VENTOLIN HFA) 108 (90 Base) MCG/ACT inhaler Inhale 1-2 puffs into the lungs every 6 (six) hours as needed for wheezing or shortness of breath.    Marland Kitchen aspirin 325 MG tablet Take 1 tablet (325 mg total) by mouth daily.    Marland Kitchen atorvastatin (LIPITOR) 40 MG tablet TAKE 1 TABLET BY MOUTH DAILY AT 6 PM. 30 tablet 4  . Blood Glucose Monitoring Suppl (ACCU-CHEK AVIVA PLUS) w/Device KIT ACHS for E11.9 1 kit 0  . glucose blood (ACCU-CHEK AVIVA) test strip Check blood sugar AC and qHS for E11.9 100 each 12  . insulin aspart (NOVOLOG FLEXPEN) 100 UNIT/ML FlexPen Inject 8 Units into the skin 3 (three) times daily with meals. And pen needles 5/day 15 mL 11  . Insulin Glargine (LANTUS SOLOSTAR) 100 UNIT/ML Solostar Pen Inject 15 Units into the skin at bedtime. 5 pen PRN  . INSULIN SYRINGE .5CC/28G 28G X 1/2" 0.5 ML MISC Give insulin once per night 100 each 12  . Lancet Devices (ACCU-CHEK SOFTCLIX) lancets Check blood sugar ACHS for E11.9 1 each 11  . levothyroxine (SYNTHROID, LEVOTHROID) 112 MCG tablet Take 112 mcg by mouth daily.    Marland Kitchen  Liraglutide 18 MG/3ML SOPN Inject 0.3 mLs (1.8 mg total) into the skin daily with breakfast. 6 mL 11  . loratadine (CLARITIN) 10 MG tablet TAKE ONE TABLET BY MOUTH ONCE DAILY (Patient taking differently: TAKE ONE TABLET BY MOUTH ONCE DAILY AS NEEDED FOR ALLERGIES) 30 tablet 2   No current facility-administered medications on file prior to visit.     Allergies  Allergen Reactions  . Anesthetics, Ester Anaphylaxis    Family History  Problem Relation Age of Onset  . Diabetes Mother   . Heart disease Mother     Before age 38  . Cancer Father     Lung  . Hypertension Sister   . Diabetes Sister   . Diabetes Sister   . Diabetes Sister    BP (!) 158/72   Pulse 92   Ht _0  (1.575 m)   Wt 146 lb (66.2 kg)   SpO2  95%   BMI 26.70 kg/m   Review of Systems She denies hypoglycemia.     Objective:   Physical Exam VITAL SIGNS:  See vs page GENERAL: no distress Pulses: dorsalis pedis intact bilat.   MSK: no deformity of the feet CV: no leg edema Skin:  no ulcer on the feet.  normal color and temp on the feet. Neuro: sensation is intact to touch on the feet     Assessment & Plan:  Insulin-requiring type 2 DM: she needs increased rx Patient is advised the following: Patient Instructions  check your blood sugar twice a day.  vary the time of day when you check, between before the 3 meals, and at bedtime.  also check if you have symptoms of your blood sugar being too high or too low.  please keep a record of the readings and bring it to your next appointment here (or you can bring the meter itself).  You can write it on any piece of paper.  please call us sooner if your blood sugar goes below 70, or if you have a lot of readings over 200.  Let's check the thyroid biopsy at a future date--just let me know.  It is easy.   please increase the victoza to the highest (1.8) setting, and continue that setting, and:  Please continue the same lantus (15 units at bedtime), and:  continue novolog, 8 units 3 times a day (just before each meal).   please call [phone], to get an appointment with a new primary doctor.  Until then, I have refilled the prilosec once.   Please come back for a follow-up appointment in 6 weeks.

## 2016-06-02 ENCOUNTER — Other Ambulatory Visit: Payer: Self-pay | Admitting: Internal Medicine

## 2016-06-03 DIAGNOSIS — Z23 Encounter for immunization: Secondary | ICD-10-CM | POA: Diagnosis not present

## 2016-06-10 ENCOUNTER — Other Ambulatory Visit: Payer: Self-pay | Admitting: Internal Medicine

## 2016-06-10 DIAGNOSIS — E785 Hyperlipidemia, unspecified: Secondary | ICD-10-CM

## 2016-06-17 ENCOUNTER — Other Ambulatory Visit: Payer: Self-pay | Admitting: Internal Medicine

## 2016-06-17 ENCOUNTER — Other Ambulatory Visit (HOSPITAL_COMMUNITY): Payer: Self-pay | Admitting: Psychiatry

## 2016-06-17 DIAGNOSIS — E785 Hyperlipidemia, unspecified: Secondary | ICD-10-CM

## 2016-06-17 DIAGNOSIS — F32A Depression, unspecified: Secondary | ICD-10-CM

## 2016-06-17 DIAGNOSIS — F329 Major depressive disorder, single episode, unspecified: Secondary | ICD-10-CM

## 2016-06-18 ENCOUNTER — Other Ambulatory Visit: Payer: Self-pay | Admitting: Pharmacist

## 2016-06-18 DIAGNOSIS — E1141 Type 2 diabetes mellitus with diabetic mononeuropathy: Secondary | ICD-10-CM

## 2016-06-18 DIAGNOSIS — Z794 Long term (current) use of insulin: Principal | ICD-10-CM

## 2016-06-18 MED ORDER — GLUCOSE BLOOD VI STRP: ORAL_STRIP | 12 refills | Status: DC

## 2016-06-19 ENCOUNTER — Other Ambulatory Visit: Payer: Self-pay | Admitting: Internal Medicine

## 2016-06-22 ENCOUNTER — Other Ambulatory Visit (HOSPITAL_COMMUNITY)
Admission: RE | Admit: 2016-06-22 | Discharge: 2016-06-22 | Disposition: A | Discharge status: Home / Self Care | Payer: Medicare Other | Source: Ambulatory Visit | Attending: Endocrinology | Admitting: Endocrinology

## 2016-06-22 ENCOUNTER — Ambulatory Visit (INDEPENDENT_AMBULATORY_CARE_PROVIDER_SITE_OTHER): Payer: Medicare Other | Admitting: Endocrinology

## 2016-06-22 ENCOUNTER — Encounter: Payer: Self-pay | Admitting: Endocrinology

## 2016-06-22 VITALS — BP 142/82 | HR 107 | Ht 62.0 in | Wt 148.0 lb

## 2016-06-22 DIAGNOSIS — E041 Nontoxic single thyroid nodule: Secondary | ICD-10-CM

## 2016-06-22 DIAGNOSIS — I6522 Occlusion and stenosis of left carotid artery: Secondary | ICD-10-CM

## 2016-06-22 MED ORDER — LIRAGLUTIDE 18 MG/3ML ~~LOC~~ SOPN: 1.8000 mg | PEN_INJECTOR | Freq: Every day | SUBCUTANEOUS | 11 refills | Status: DC

## 2016-06-22 MED ORDER — INSULIN ASPART 100 UNIT/ML FLEXPEN: PEN_INJECTOR | SUBCUTANEOUS | 11 refills | Status: DC

## 2016-06-22 NOTE — Patient Instructions (Addendum)
We'll let you know about the biopsy results. Please have your blood pressure rechecked soon, with Dr Feliciana Rossetti.  I'll see you next time.  check your blood sugar twice a day.  vary the time of day when you check, between before the 3 meals, and at bedtime.  also check if you have symptoms of your blood sugar being too high or too low.  please keep a record of the readings and bring it to your next appointment here (or you can bring the meter itself).  You can write it on any piece of paper.  please call us sooner if your blood sugar goes below 70, or if you have a lot of readings over 200.   Please continue the same victoza and lantus, and: change novolog to 3 times a day (just before each meal) 12-22-10 units.   Please come back for a follow-up appointment in 1 month.

## 2016-06-22 NOTE — Progress Notes (Signed)
Subjective:    Patient ID: Sonia Skinner, female    DOB: [birth year], 65 y.o.   MRN: [id number]  HPI Pt returns for f/u of diabetes mellitus: DM type: Insulin-requiring type 2 (but she is prob is evolving type 1) Dx'ed: 1751 Complications: polyneuropathy and CVA Therapy: insulin since 2000 GDM: never DKA: never Severe hypoglycemia: never Pancreatitis: never Other: in 2017, she had episode of nonketotic hyperosmolar hyperglycemic state; She is a Pharmacist, hospital, and has little time at lunch. Interval history: Pt says cbg's vary from 75 (lunch) up to 200's) at HS.  pt states she feels well in general.  She says byetta works better than victoza.   Past Medical History:  Diagnosis Date  . Anxiety   . Carotid artery occlusion   . Complication of anesthesia    ALLERY TO ESTER BASE  . Depression    early 26s  . Diabetes mellitus    INSULIN DEPENDENT  . GERD (gastroesophageal reflux disease)   . Headache   . Hypertension   . Stroke [facility]) March 2015    left MCA infarct  . Thyroid disease     Past Surgical History:  Procedure Laterality Date  . ABDOMINAL HYSTERECTOMY    . ANTERIOR FIXATION AND POSTERIOR MICRODISCECTOMY CERVICAL SPINE  1999  . CAROTID ENDARTERECTOMY Left 11-04-13   cea  . ENDARTERECTOMY Left 11/04/2013    Social History   Social History  . Marital status: Legally Separated    Spouse name: N/A  . Number of children: 0  . Years of education: College   Occupational History  . [place] History Main Topics  . Smoking status: Former Smoker    Packs/day: 0.20    Years: 20.00    Types: Cigarettes    Quit date: 11/01/2013  . Smokeless tobacco: Never Used  . Alcohol use No  . Drug use: No  . Sexual activity: Not on file   Other Topics Concern  . Not on file   Social History Narrative   Patient lives at home with sister.   Caffeine Use: 2 cups daily; sodas occasionally    Current Outpatient Prescriptions on File Prior to Visit  Medication  Sig Dispense Refill  . ACCU-CHEK SOFTCLIX LANCETS lancets ACHS for E11.9 100 each 12  . albuterol (PROVENTIL HFA;VENTOLIN HFA) 108 (90 Base) MCG/ACT inhaler Inhale 1-2 puffs into the lungs every 6 (six) hours as needed for wheezing or shortness of breath.    Marland Kitchen aspirin 325 MG tablet Take 1 tablet (325 mg total) by mouth daily.    Marland Kitchen atorvastatin (LIPITOR) 40 MG tablet TAKE ONE TABLET BY MOUTH ONCE DAILY AT  6PM 30 tablet 0  . Blood Glucose Monitoring Suppl (ACCU-CHEK AVIVA PLUS) w/Device KIT ACHS for E11.9 1 kit 0  . glucose blood (ACCU-CHEK AVIVA) test strip Check blood sugar AC and qHS for E11.9 100 each 12  . Insulin Glargine (LANTUS SOLOSTAR) 100 UNIT/ML Solostar Pen Inject 15 Units into the skin at bedtime. 5 pen PRN  . INSULIN SYRINGE .5CC/28G 28G X 1/2" 0.5 ML MISC Give insulin once per night 100 each 12  . Lancet Devices (ACCU-CHEK SOFTCLIX) lancets Check blood sugar ACHS for E11.9 1 each 11  . levothyroxine (SYNTHROID, LEVOTHROID) 112 MCG tablet Take 112 mcg by mouth daily.    Marland Kitchen loratadine (CLARITIN) 10 MG tablet TAKE ONE TABLET BY MOUTH ONCE DAILY (Patient taking differently: TAKE ONE TABLET BY MOUTH ONCE DAILY AS NEEDED FOR ALLERGIES) 30 tablet 2  .  omeprazole (PRILOSEC) 40 MG capsule Take 1 capsule (40 mg total) by mouth daily. 30 capsule 0   No current facility-administered medications on file prior to visit.     Allergies  Allergen Reactions  . Anesthetics, Ester Anaphylaxis    Family History  Problem Relation Age of Onset  . Diabetes Mother   . Heart disease Mother     Before age 59  . Cancer Father     Lung  . Hypertension Sister   . Diabetes Sister   . Diabetes Sister   . Diabetes Sister     BP (!) 142/82   Pulse (!) 107   Ht '5\' 2"'$  (1.575 m)   Wt 148 lb (67.1 kg)   SpO2 98%   BMI 27.07 kg/m   Review of Systems she denies hypoglycemia.      Objective:   Physical Exam VITAL SIGNS:  See vs page GENERAL: no distress NECK: 2 cm left thyroid  nodule  thyroid needle bx: consent obtained, signed form on chart The area is first sprayed with cooling agent local: xylocaine 2%, with epinephrine prep: alcohol pad 4 bxs are done with 25 and 64B needles no complications    Assessment & Plan:  Insulin-requiring type 2 DM: Based on the pattern of her cbg's, she needs some adjustment in her therapy.  Thyroid nodule: bx done.   HTN: f/u is needed.   Patient is advised the following: Patient Instructions  We'll let you know about the biopsy results. Please have your blood pressure rechecked soon, with Dr Feliciana Rossetti.  I'll see you next time.  check your blood sugar twice a day.  vary the time of day when you check, between before the 3 meals, and at bedtime.  also check if you have symptoms of your blood sugar being too high or too low.  please keep a record of the readings and bring it to your next appointment here (or you can bring the meter itself).  You can write it on any piece of paper.  please call us sooner if your blood sugar goes below 70, or if you have a lot of readings over 200.   Please continue the same victoza and lantus, and: change novolog to 3 times a day (just before each meal) 12-22-10 units.   Please come back for a follow-up appointment in 1 month.

## 2016-06-23 ENCOUNTER — Other Ambulatory Visit: Payer: Self-pay | Admitting: Internal Medicine

## 2016-07-07 ENCOUNTER — Encounter: Payer: Self-pay | Admitting: Neurology

## 2016-07-07 ENCOUNTER — Other Ambulatory Visit (HOSPITAL_COMMUNITY): Payer: Self-pay | Admitting: Psychiatry

## 2016-07-07 ENCOUNTER — Ambulatory Visit (INDEPENDENT_AMBULATORY_CARE_PROVIDER_SITE_OTHER): Payer: Medicare Other | Admitting: Neurology

## 2016-07-07 VITALS — BP 186/79 | HR 87 | Wt 151.2 lb

## 2016-07-07 DIAGNOSIS — G44209 Tension-type headache, unspecified, not intractable: Secondary | ICD-10-CM | POA: Diagnosis not present

## 2016-07-07 DIAGNOSIS — F32A Depression, unspecified: Secondary | ICD-10-CM

## 2016-07-07 DIAGNOSIS — I6522 Occlusion and stenosis of left carotid artery: Secondary | ICD-10-CM | POA: Diagnosis not present

## 2016-07-07 DIAGNOSIS — F329 Major depressive disorder, single episode, unspecified: Secondary | ICD-10-CM

## 2016-07-07 MED ORDER — AMITRIPTYLINE HCL 50 MG PO TABS: 50.0000 mg | ORAL_TABLET | Freq: Every day | ORAL | 0 refills | Status: DC

## 2016-07-07 NOTE — Progress Notes (Signed)
PATIENT: Sonia Skinner DOB: 11-Mar-1951  REASON FOR VISIT: routine follow up for stroke HISTORY FROM: patient  HISTORY OF PRESENT ILLNESS: Sonia Skinner is a 65 y.o. female with a history of hypertension and diabetes mellitus who comes in for first office follow up after hospital discharge for stroke.  She presented at [facility] on 11/01/13 with a complaint of intermittent blurring of vision involving left eye for about one week, and new onset weakness involving right upper extremity. CT scan of her head showed no acute intracranial abnormality except for possible abnormal density involving the left frontal lobe superiorly, suggestive of possible subacute stroke. Patient had not been on antiplatelet therapy. Has no previous history of stroke nor TIA. NIH stroke score was 1 for pronation and drift of right upper extremity. Work up at that time revealed an elevated A1c 10.5 and LDL 147. MRI brain showed patchy acute left MCA infarct.  There was critical left ICA stenosis by CTA. Right carotid revealed a plaque at the bifurcation. Vertebrals were patent. MRA showed Moderate to marked tandem stenosis of the internal carotid artery cavernous segment bilaterally. Moderate to marked narrowing proximal left middle cerebral artery branch. Moderate narrowing involving portions of the posterior inferior cerebellar artery bilaterally. Poor delineation left anterior inferior cerebellar artery. Small caliber right superior cerebellar artery. Echocardiogram showed no intracardiac masses or thrombi. She underwent left CEA, with a stenosis of 95%, on 11/04/2013 by Dr. Scot Dock and was discharged from the hospital on 11/06/13 on aspirin.  She awakened at about 11PM 11/24/2013 and was dizzy but was able to eat and went back to bed. When she awakened the next morning morning and started to wash her face she noted that the left side of her face was numb. She went on to church and started to feel even worse. Became dizzy and nauseous and  felt numb on the entire left side. Patient called EMS at that time. MRI was negative for acute infarct. A week later the left facial numbness went back to baseline, the nausea resolved by the next morning.  She still has loss of fine motor skill in left hand. She finished physical therapy, is left hand dominant, still has some right arm loss of dexterity, denies right leg weakness. She states still some pulling feeling at left side of neck, the area is tender but well healed. Has keloid scar at left CEA incision.  Her blood pressure is well controlled, it is 143/60 in office today. She is tolerating her daily aspirin well without significant bruising or bleeding. She is not yet had her cholesterol rechecked since the hospital and starting on Lipitor. She has no known side effects with Lipitor. Her morning blood sugars run between 100-200. She states that she is working on his and trying to change her diet.  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 She hopes that at the start of the school year she can go back as a Optometrist. She plans to work until age 69 to get full retirement.  Update 05/14/14 (LL): Since last visit, she has been well but reports being more tired. She is finding it harder to keep up with her tasks at her job. She feels that she is not as organized as before and could not multi-task easily anymore. She states that she is much quicker to anger now. She states her blood pressure is well controlled, though it  is elevated in the office today at 156/67. She is tolerating her daily aspirin well without significant bruising or bleeding. Last hgb a1c was 9.4, but she is working closely with PCP to get better control of DM; hopes to start Byetta this week. Hopes it will help her lose the 20 lbs. she has gained recently.  She is tolerating aspirin well with no signs of significant bleeding or bruising. Update 12/24/2014 :  She returns for follow-up after last visit 8 months ago. She complained of cognitive difficulties post stroke and Dr. Jannifer Franklin referred her to Dr. Willette Alma and for neuro psych testing which was done on 11/19/14 which shows significant underlying anxiety depression as well as post stroke mild cognitive impairment. Patient has been on Wellbutrin but for smoking cessation but it is not helping her depression. She also complains of mild tremors in hands. She's not had any recurrent stroke or TIA symptoms. She remains on aspirin which is tolerating well without bleeding or bruising. Her blood pressure is well controlled and it is 130/74 today. She has a primary care physician visit later today and transfer of lipid profile checked. She did have follow-up carotid ultrasound done in March this year and Dr. Mee Hives office and it was fine. Update 07/09/2015 ; she returns for follow-up after last visit 6 months ago. She had transient episode of confusion, disorientation and speech and word finding difficulties and was seen in the emergency room on 06/12/15. MRI scan of the brain and CT scan both did not show any acute abnormality. She was found to be intact infection and was treated with antibiotics and gradually improved within a week. She states she's quit smoking completely. She states her blood pressure is well controlled today it is 124/6 fine office. She states her sugars are doing better though last hemoglobin A1c on 05/13/15 was 8.4. She is tolerating aspirin as well as all the medications without any side effects. She has not yet seen a psychiatrist to help manage her depression. She remains on Wellbutrin and is stable dose. She continues to have mild memory difficulties which are unchanged and not progressive. Update 07/07/2016 : She returns for follow-up last visit a year ago. She has not had any recurrent stroke or TIA symptoms. She remains on aspirin which is tolerating well without bleeding or bruising. She states  up blood pressure is usually well controlled but she is having a headache today and hence it is elevated in office in 186/79. She states her memory loss she feels that subjectively getting worse and she is frequently misses playing abeyance. She states her depression is better controlled after she was started seeing behavioral health clinic. She is currently on Lexapro and amitriptyline. She was admitted in July for 4 days with significantly elevated blood sugars continues to struggle to have them and controlled. She recently was diagnosed with a thyroid nodule and underwent a biopsy and the results are awaited. She complains of frequent headaches that she is having about 3 or 4 times a week. These are intermittent noted mainly over that of top of the head. It may be sharp and at 19. She rates it as 8/10. There is a complain nausea and some light and sound sensitivity. She does have a remote history of migraine headaches. She does take extra strength Tylenol around 4 tablets at a time 2-3 times per day for past several months with only   partial relief. REVIEW OF SYSTEMS: Full 14 system review of systems performed and notable  only for: Excessive sweating, fatigue, hearing loss, excessive eating, frequent waking, memory loss, headache, depression and all other systems negative    ALLERGIES: Allergies  Allergen Reactions  . Anesthetics, Ester Anaphylaxis    HOME MEDICATIONS: Outpatient Medications Prior to Visit  Medication Sig Dispense Refill  . ACCU-CHEK SOFTCLIX LANCETS lancets ACHS for E11.9 100 each 12  . albuterol (PROVENTIL HFA;VENTOLIN HFA) 108 (90 Base) MCG/ACT inhaler Inhale 1-2 puffs into the lungs every 6 (six) hours as needed for wheezing or shortness of breath.    Marland Kitchen aspirin 325 MG tablet Take 1 tablet (325 mg total) by mouth daily.    Marland Kitchen atorvastatin (LIPITOR) 40 MG tablet TAKE ONE TABLET BY MOUTH ONCE DAILY AT  6PM 30 tablet 0  . Blood Glucose Monitoring Suppl (ACCU-CHEK AVIVA PLUS)  w/Device KIT ACHS for E11.9 1 kit 0  . glucose blood (ACCU-CHEK AVIVA) test strip Check blood sugar AC and qHS for E11.9 100 each 12  . insulin aspart (NOVOLOG FLEXPEN) 100 UNIT/ML FlexPen 3 times a day (just before each meal) 12-22-10 units, and pen needles 5/day 15 mL 11  . Insulin Glargine (LANTUS SOLOSTAR) 100 UNIT/ML Solostar Pen Inject 15 Units into the skin at bedtime. 5 pen PRN  . INSULIN SYRINGE .5CC/28G 28G X 1/2" 0.5 ML MISC Give insulin once per night 100 each 12  . Lancet Devices (ACCU-CHEK SOFTCLIX) lancets Check blood sugar ACHS for E11.9 1 each 11  . levothyroxine (SYNTHROID, LEVOTHROID) 112 MCG tablet Take 112 mcg by mouth daily.    Marland Kitchen liraglutide 18 MG/3ML SOPN Inject 0.3 mLs (1.8 mg total) into the skin daily with breakfast. 6 mL 11  . loratadine (CLARITIN) 10 MG tablet TAKE ONE TABLET BY MOUTH ONCE DAILY (Patient taking differently: TAKE ONE TABLET BY MOUTH ONCE DAILY AS NEEDED FOR ALLERGIES) 30 tablet 2  . omeprazole (PRILOSEC) 40 MG capsule Take 1 capsule (40 mg total) by mouth daily. 30 capsule 0   No facility-administered medications prior to visit.     PHYSICAL EXAM Vitals:   07/07/16 1049  BP: (!) 186/79  BP Location: Right Arm  Patient Position: Sitting  Cuff Size: Small  Pulse: 87  Weight: 151 lb 3.2 oz (68.6 kg)   Body mass index is 27.65 kg/m.  Generalized: Frail middle-aged African-American lady, in no acute distress, pleasant AA female Head: normocephalic and atraumatic.   Neck: Supple, no carotid bruits, left CEA scar with mild keloid Cardiac: Regular rate rhythm, no murmur  Musculoskeletal: No deformity   Neurological examination  Mentation: Alert oriented to time, place, history taking. Follows all commands speech and language fluent. Recall 3/3. Animal naming 9. Intact calculation. Difficulty with multitasking Cranial nerve II-XII: Fundoscopic exam not done. Pupils were equal round reactive to light extraocular movements were full, visual field were  full on confrontational test. Facial sensation mildly decreased on the right but strength is normal bilaterally. Hearing was intact to finger rubbing bilaterally. Uvula tongue midline. head turning and shoulder shrug and were normal and symmetric.Tongue protrusion into cheek strength was normal. Motor: The motor testing reveals 5 over 5 strength of all 4 extremities. Good symmetric motor tone is noted throughout. Mildly decreased fine motor skills in left hand. Sensory: Sensory testing is intact to soft touch on all 4 extremities. No evidence of extinction is noted.  Coordination: Cerebellar testing reveals good finger-nose-finger and heel-to-shin bilaterally.  Gait and station: Gait is normal. Tandem gait is normal. Romberg is negative. Reflexes: Deep tendon reflexes are symmetric and  normal bilaterally.      ASSESSMENT: 65 y.o. year old female  has a past medical history of Hypertension; Thyroid disease; Complication of anesthesia; Diabetes mellitus; GERD (gastroesophageal reflux disease); Carotid artery occlusion; and left MCA stroke on 11/01/2013. Status post left CEA by Dr. Doren Custard. Left sided weakness occurred approximately 3 weeks later but no acute infarct was seen on MRI. Vascular risk factors of diabetes, former smoker, hypertension, and hyperlipidemia. Mild cognitive difficulties post stroke likely mild cognitive impairment with some underlying  Suboptimally treated depression. Recent transient recrudescence of old stroke deficits in October 2016 in the setting of urinary tract infection. New complains of frequent headaches likely mixed tension headaches with analgesic rebound  PLAN: I had a long d/w patient about her remote stroke,tension headaches, risk for recurrent stroke/TIAs, personally independently reviewed imaging studies and stroke evaluation results and answered questions I also discussed her present visit to the emergency room with a recrudescence of her old deficits in the setting  of a urinary tract infection and personally reviewed MRI results with the patient.Continue aspirin 325 mg daily  for secondary stroke prevention and maintain strict control of hypertension with blood pressure goal below 130/90, diabetes with hemoglobin A1c goal below 6.5% and lipids with LDL cholesterol goal below 70 mg/dL. I also advised the patient to eat a healthy diet with plenty of whole grains, cereals, fruits and vegetables, exercise regularly and maintain ideal body weight I advised her to  increase the dose of amitriptyline to 75 mg at night to help with headache prevention. She was also advised to do regular neck stretching exercises and participate in activities like regular exercise, swimming, medication and yoga for stress relaxation. I recommend she discontinue extra strength Tylenol and she is taking too many medications and these may be contributing to analgesic rebound headache.  Followup in the future with me in one year or call earlier if necessary.Greater than 50% time during this 25 minute visit was spent on counseling and coordination of care. Followup in the future with me in one year or call earlier if necessary.  Antony Contras, MD  07/07/2016, 11:14 AM [facility] [address] [place], [place] [address] [phone]  Note: This document was prepared with digital dictation and possible smart phrase technology. Any transcriptional errors that result from this process are unintentional.

## 2016-07-10 ENCOUNTER — Other Ambulatory Visit: Payer: Self-pay | Admitting: Endocrinology

## 2016-07-11 NOTE — Progress Notes (Signed)
Subjective:    Patient ID: Sonia Skinner, female    DOB: 26-Nov-1950, 65 y.o.   MRN: [id number]  HPI Pt returns for f/u of diabetes mellitus: DM type: Insulin-requiring type 2 (but she is prob is evolving type 1) Dx'ed: 5456 Complications: polyneuropathy, PAD, and CVA Therapy: insulin since 2000 GDM: never DKA: never Severe hypoglycemia: never Pancreatitis: never Other: in 2017, she had episode of nonketotic hyperosmolar hyperglycemic state; She is a Pharmacist, hospital, and has little time at lunch, but she takes multiple daily injections. She says she cannot take insulin at lunch, but she does eat lunch.   Interval history: she takes novolog to 3 times a day (just before each meal), approx 8-0-10 units, and lantus, 30 units qhs.  she brings a record of her cbg's which i have reviewed today.  It varies from 151-309.  It is in general higher as the day goes on.   Pt also has multinodular goiter (dx'ed 2017; left nodule bx was beth cat 1; plan is to repeat US in mid to late-2018; she takes synthroid for hypothyoidism).   Past Medical History:  Diagnosis Date  . Anxiety   . Carotid artery occlusion   . Complication of anesthesia    ALLERY TO ESTER BASE  . Depression    early 109s  . Diabetes mellitus    INSULIN DEPENDENT  . GERD (gastroesophageal reflux disease)   . Headache   . Hypertension   . Stroke [facility]) March 2015    left MCA infarct  . Thyroid disease     Past Surgical History:  Procedure Laterality Date  . ABDOMINAL HYSTERECTOMY    . ANTERIOR FIXATION AND POSTERIOR MICRODISCECTOMY CERVICAL SPINE  1999  . CAROTID ENDARTERECTOMY Left 11-04-13   cea  . ENDARTERECTOMY Left 11/04/2013    Social History   Social History  . Marital status: Legally Separated    Spouse name: N/A  . Number of children: 0  . Years of education: College   Occupational History  . [place] History Main Topics  . Smoking status: Former Smoker    Packs/day: 0.20    Years: 20.00   Types: Cigarettes    Quit date: 11/01/2013  . Smokeless tobacco: Never Used  . Alcohol use No  . Drug use: No  . Sexual activity: Not on file   Other Topics Concern  . Not on file   Social History Narrative   Patient lives at home with sister.   Caffeine Use: 2 cups daily; sodas occasionally    Current Outpatient Prescriptions on File Prior to Visit  Medication Sig Dispense Refill  . ACCU-CHEK SOFTCLIX LANCETS lancets ACHS for E11.9 100 each 12  . albuterol (PROVENTIL HFA;VENTOLIN HFA) 108 (90 Base) MCG/ACT inhaler Inhale 1-2 puffs into the lungs every 6 (six) hours as needed for wheezing or shortness of breath.    Marland Kitchen amitriptyline (ELAVIL) 50 MG tablet Take 1 tablet (50 mg total) by mouth at bedtime. 30 tablet 0  . amitriptyline (ELAVIL) 50 MG tablet Take 1 tablet (50 mg total) by mouth at bedtime. (Patient taking differently: Take 75 mg by mouth at bedtime. ) 30 tablet 0  . aspirin 325 MG tablet Take 1 tablet (325 mg total) by mouth daily.    . Blood Glucose Monitoring Suppl (ACCU-CHEK AVIVA PLUS) w/Device KIT ACHS for E11.9 1 kit 0  . glucose blood (ACCU-CHEK AVIVA) test strip Check blood sugar AC and qHS for E11.9 100 each 12  .  INSULIN SYRINGE .5CC/28G 28G X 1/2" 0.5 ML MISC Give insulin once per night 100 each 12  . Lancet Devices (ACCU-CHEK SOFTCLIX) lancets Check blood sugar ACHS for E11.9 1 each 11  . levothyroxine (SYNTHROID, LEVOTHROID) 112 MCG tablet Take 112 mcg by mouth daily.    Marland Kitchen liraglutide 18 MG/3ML SOPN Inject 0.3 mLs (1.8 mg total) into the skin daily with breakfast. 6 mL 11  . loratadine (CLARITIN) 10 MG tablet TAKE ONE TABLET BY MOUTH ONCE DAILY (Patient taking differently: TAKE ONE TABLET BY MOUTH ONCE DAILY AS NEEDED FOR ALLERGIES) 30 tablet 2   No current facility-administered medications on file prior to visit.     Allergies  Allergen Reactions  . Anesthetics, Ester Anaphylaxis    Family History  Problem Relation Age of Onset  . Diabetes Mother   .  Heart disease Mother     Before age 59  . Cancer Father     Lung  . Hypertension Sister   . Diabetes Sister   . Diabetes Sister   . Diabetes Sister     BP (!) 142/84   Pulse 98   Ht '5\' 2"'$  (1.575 m)   Wt 150 lb (68 kg)   SpO2 95%   BMI 27.44 kg/m    Review of Systems She denies hypoglycemia.  She has weight gain.    Objective:   Physical Exam VITAL SIGNS:  See vs page GENERAL: no distress Pulses: dorsalis pedis intact bilat.   MSK: no deformity of the feet CV: no leg edema Skin:  no ulcer on the feet.  normal color and temp on the feet. Neuro: sensation is intact to touch on the feet.  Ext: There is bilateral onychomycosis of the toenails.    A1c=9.8%    Assessment & Plan:  Insulin-requiring type 2 DM, with PAD: Based on the pattern of her cbg's, she needs some adjustment in her therapy.  Dyslipidemia: she needs refill, but also needs to see PCP. Multinodular goiter: she'll be due for refill next year.   Weight gain, due to presumed reduced glycosuria.  We discussed.    Patient is advised the following: Patient Instructions  You will be due to recheck the ultrasound late next year.  I'll see you next time.  check your blood sugar twice a day.  vary the time of day when you check, between before the 3 meals, and at bedtime.  also check if you have symptoms of your blood sugar being too high or too low.  please keep a record of the readings and bring it to your next appointment here (or you can bring the meter itself).  You can write it on any piece of paper.  please call us sooner if your blood sugar goes below 70, or if you have a lot of readings over 200.   Please continue the same victoza, and: Take lantus, 35 units at bedtime, and:    change novolog to 3 times a day (just before meals) 10-0-25 units.   Please come back for a follow-up appointment in 2 months.   please call [phone] ([facility] physician referral line), to get an appointment with a new primary  doctor. Until then, I have refilled your lipitor and prilosec.

## 2016-07-12 ENCOUNTER — Ambulatory Visit (INDEPENDENT_AMBULATORY_CARE_PROVIDER_SITE_OTHER): Payer: Medicare Other | Admitting: Endocrinology

## 2016-07-12 ENCOUNTER — Encounter: Payer: Self-pay | Admitting: Endocrinology

## 2016-07-12 DIAGNOSIS — I6522 Occlusion and stenosis of left carotid artery: Secondary | ICD-10-CM | POA: Diagnosis not present

## 2016-07-12 DIAGNOSIS — E785 Hyperlipidemia, unspecified: Secondary | ICD-10-CM | POA: Diagnosis not present

## 2016-07-12 MED ORDER — ATORVASTATIN CALCIUM 40 MG PO TABS: 40.0000 mg | ORAL_TABLET | Freq: Every day | ORAL | 2 refills | Status: DC

## 2016-07-12 MED ORDER — INSULIN ASPART 100 UNIT/ML FLEXPEN: PEN_INJECTOR | SUBCUTANEOUS | 11 refills | Status: DC

## 2016-07-12 MED ORDER — OMEPRAZOLE 40 MG PO CPDR: 40.0000 mg | DELAYED_RELEASE_CAPSULE | Freq: Every day | ORAL | 2 refills | Status: DC

## 2016-07-12 MED ORDER — INSULIN GLARGINE 100 UNIT/ML SOLOSTAR PEN: 35.0000 [IU] | PEN_INJECTOR | Freq: Every day | SUBCUTANEOUS | 99 refills | Status: DC

## 2016-07-12 NOTE — Patient Instructions (Addendum)
You will be due to recheck the ultrasound late next year.  I'll see you next time.  check your blood sugar twice a day.  vary the time of day when you check, between before the 3 meals, and at bedtime.  also check if you have symptoms of your blood sugar being too high or too low.  please keep a record of the readings and bring it to your next appointment here (or you can bring the meter itself).  You can write it on any piece of paper.  please call us sooner if your blood sugar goes below 70, or if you have a lot of readings over 200.   Please continue the same victoza, and: Take lantus, 35 units at bedtime, and:    change novolog to 3 times a day (just before meals) 10-0-25 units.   Please come back for a follow-up appointment in 2 months.   please call [phone] ([facility] physician referral line), to get an appointment with a new primary doctor. Until then, I have refilled your lipitor and prilosec.

## 2016-07-21 ENCOUNTER — Encounter (HOSPITAL_COMMUNITY): Payer: Self-pay | Admitting: Psychiatry

## 2016-07-21 ENCOUNTER — Ambulatory Visit (INDEPENDENT_AMBULATORY_CARE_PROVIDER_SITE_OTHER): Payer: Medicare Other | Admitting: Psychiatry

## 2016-07-21 ENCOUNTER — Ambulatory Visit (HOSPITAL_COMMUNITY): Payer: Self-pay | Admitting: Psychiatry

## 2016-07-21 VITALS — BP 144/80 | HR 112 | Ht 62.0 in | Wt 151.6 lb

## 2016-07-21 DIAGNOSIS — F331 Major depressive disorder, recurrent, moderate: Secondary | ICD-10-CM | POA: Diagnosis not present

## 2016-07-21 DIAGNOSIS — Z79899 Other long term (current) drug therapy: Secondary | ICD-10-CM

## 2016-07-21 DIAGNOSIS — I6522 Occlusion and stenosis of left carotid artery: Secondary | ICD-10-CM | POA: Diagnosis not present

## 2016-07-21 MED ORDER — ESCITALOPRAM OXALATE 20 MG PO TABS: 20.0000 mg | ORAL_TABLET | Freq: Every day | ORAL | 2 refills | Status: DC

## 2016-07-21 NOTE — Progress Notes (Signed)
[facility] Progress Note  Sonia Skinner [id number] 66 y.o.  07/21/2016 2:34 PM  Chief Complaint:  I ran out from Lexapro 1 week ago.  I'm feeling anxious.      History of Present Illness:  Medication for her follow-up appointment.  She has not seen in a while and missed her last appointment.  She is out of Lexapro 1 week ago and feeling anxious and nervous.  She recently seen her neurologist who increase her amitriptyline to 75 mg.  She is sleeping better.  However she feels depressed and wanted to go back on Lexapro.  She admitted when she was taking the Lexapro she was feeling less depressed.  She denies any irritability, mania, anger or any hallucination.  She has result symptoms of stroke with weakness in her hand.  She also feels some time numbness and tingling.  Lately she has noticed more forgetful and she discuss with her neurologist on her appointment.  Patient denies any tremors or shakes.  She denies any feeling of hopelessness.  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  She lives with her sister.  Patient denies drinking alcohol or using any illegal substances.  Her appetite is okay.  Her energy level is good.  Patient is actively looking for a primary care physician since her previous physician left.  However she is taking her diabetes medication on time and taking her blood pressure regularly.  Her hemoglobin A1c is drop from 10.5 to 9.  Suicidal Ideation: No Plan Formed: No Patient has means to carry out plan: No  Homicidal Ideation: No Plan Formed: No Patient has means to carry out plan: No  Past Psychiatric History/Hospitalization(s): Patient remember taking antidepressant 20 years ago.  She has one psychiatric admission at [facility] at that time after taking overdose on medication.  She was seen at [facility] in 2017 2008 and given Strattera, Adderall, Zoloft, Celexa .  She denies any history of mania, psychosis,  hallucination or any self abusive behavior.  She was never tested for ADD.  In 2001 she moved to [facility] and she has seen psychiatrist there.  She was given Wellbutrin to stop smoking for past few years.  Patient denies any history of physical, sexual, verbal learning emotional abuse. Anxiety: Yes Bipolar Disorder: No Depression: Yes Mania: No Psychosis: No Schizophrenia: No Personality Disorder: No Hospitalization for psychiatric illness: Yes History of Electroconvulsive Shock Therapy: No Prior Suicide Attempts: Yes  Family History; Patient do not recall any family history of psychiatric illness.  Medical History; Patient has multiple health issues.  She has diabetes, hypertension, history of stroke with carotid endarterectomy and memory impairment.  Her neurologist is Dr Leonie Man.  Patient also had history of rupture disc patient denies any history of seizures but endorse headaches on and off.  Her primary care physician is NP Jimmye Norman at [facility].  Psychosocial History; Patient born and raised in [place].  She married once however Catalina [place] it ended after 18 years.  She has no children.  Her parents are deceased.  She has multiple siblings who lives in this area.  Patient lives with her sister.  Psychiatric Specialty Exam: Physical Exam  Review of Systems  Constitutional: Negative.   HENT: Negative.   Cardiovascular: Negative.   Skin: Negative.   Neurological: Positive for tingling and focal weakness.    Blood pressure (!) 144/80, pulse (!) 112, height _0  (1.575 m), weight 151 lb  9.6 oz (68.8 kg).Body mass index is 27.73 kg/m.  General Appearance: Casual  Eye Contact:  Fair  Speech:  Slow  Volume:  Normal  Mood:  Anxious  Affect:  Congruent  Thought Process:  Coherent  Orientation:  Full (Time, Place, and Person)  Thought Content:  Logical  Suicidal Thoughts:  No  Homicidal Thoughts:  No  Memory:  Immediate;   Fair Recent;   Fair Remote;   Fair   Judgement:  Fair  Insight:  Fair  Psychomotor Activity:  Decreased  Concentration:  Concentration: Fair and Attention Span: Fair  Recall:  [employer] of Knowledge:  Good  Language:  Good  Akathisia:  No  Handed:  Right  AIMS (if indicated):     Assets:  Communication Skills Desire for Improvement  ADL's:  Intact  Cognition:  Impaired,  Mild  Sleep:       Outpatient Encounter Prescriptions as of 07/21/2016  Medication Sig  . ACCU-CHEK SOFTCLIX LANCETS lancets ACHS for E11.9  . albuterol (PROVENTIL HFA;VENTOLIN HFA) 108 (90 Base) MCG/ACT inhaler Inhale 1-2 puffs into the lungs every 6 (six) hours as needed for wheezing or shortness of breath.  Marland Kitchen amitriptyline (ELAVIL) 50 MG tablet Take 1 tablet (50 mg total) by mouth at bedtime. (Patient taking differently: Take 75 mg by mouth at bedtime. )  . aspirin 325 MG tablet Take 1 tablet (325 mg total) by mouth daily.  Marland Kitchen atorvastatin (LIPITOR) 40 MG tablet Take 1 tablet (40 mg total) by mouth daily.  . Blood Glucose Monitoring Suppl (ACCU-CHEK AVIVA PLUS) w/Device KIT ACHS for E11.9  . escitalopram (LEXAPRO) 20 MG tablet Take 1 tablet (20 mg total) by mouth daily.  Marland Kitchen glucose blood (ACCU-CHEK AVIVA) test strip Check blood sugar AC and qHS for E11.9  . insulin aspart (NOVOLOG FLEXPEN) 100 UNIT/ML FlexPen 10 units with breakfast, none with lunch, and 25 units with supper, and pen needles 4/day  . Insulin Glargine (LANTUS SOLOSTAR) 100 UNIT/ML Solostar Pen Inject 35 Units into the skin at bedtime.  . INSULIN SYRINGE .5CC/28G 28G X 1/2" 0.5 ML MISC Give insulin once per night  . Lancet Devices (ACCU-CHEK SOFTCLIX) lancets Check blood sugar ACHS for E11.9  . levothyroxine (SYNTHROID, LEVOTHROID) 112 MCG tablet Take 112 mcg by mouth daily.  Marland Kitchen liraglutide 18 MG/3ML SOPN Inject 0.3 mLs (1.8 mg total) into the skin daily with breakfast.  . loratadine (CLARITIN) 10 MG tablet TAKE ONE TABLET BY MOUTH ONCE DAILY (Patient taking differently: TAKE ONE TABLET  BY MOUTH ONCE DAILY AS NEEDED FOR ALLERGIES)  . omeprazole (PRILOSEC) 40 MG capsule Take 1 capsule (40 mg total) by mouth daily.  . [DISCONTINUED] amitriptyline (ELAVIL) 50 MG tablet Take 1 tablet (50 mg total) by mouth at bedtime.   No facility-administered encounter medications on file as of 07/21/2016.     No results found for this or any previous visit (from the past 2160 hour(s)).    Constitutional:  There were no vitals taken for this visit.   Musculoskeletal: Strength & Muscle Tone: decreased Gait & Station: Sometime unsteady Patient leans: N/A  Established Problem, Stable/Improving (1), Review of Last Therapy Session (1) and Review of Medication Regimen & Side Effects (2)  Assessment: Axis I: Major depressive disorder, recurrent moderate.    Axis II: Deferred  Axis III:  Past Medical History:  Diagnosis Date  . Anxiety   . Carotid artery occlusion   . Complication of anesthesia    ALLERY TO ESTER BASE  .  Depression    early 81s  . Diabetes mellitus    INSULIN DEPENDENT  . GERD (gastroesophageal reflux disease)   . Headache   . Hypertension   . Stroke [facility]) March 2015    left MCA infarct  . Thyroid disease      Plan:  Reinforce medication compliance.  I will continue her Lexapro 20 mg daily.  She is taking amitriptyline which is prescribed by her neurologist.  Patient is also looking for her primary care physician .  However she is taking her insulin and diabetes medication and time.  Discussed medication side effects and benefits.  She had scheduled one more appointment to see therapist which she like to keep.  Recommended to call us back if she has any question or any concern.  Follow-up in 3 months.  Taisia Fantini T., MD 07/21/2016

## 2016-08-03 ENCOUNTER — Other Ambulatory Visit (HOSPITAL_COMMUNITY): Payer: Self-pay

## 2016-08-03 ENCOUNTER — Other Ambulatory Visit: Payer: Self-pay | Admitting: Internal Medicine

## 2016-08-03 MED ORDER — AMITRIPTYLINE HCL 50 MG PO TABS: 75.0000 mg | ORAL_TABLET | Freq: Every day | ORAL | 0 refills | Status: DC

## 2016-08-04 ENCOUNTER — Other Ambulatory Visit: Payer: Self-pay | Admitting: Internal Medicine

## 2016-08-04 ENCOUNTER — Other Ambulatory Visit: Payer: Self-pay

## 2016-08-04 ENCOUNTER — Encounter: Payer: Self-pay | Admitting: Endocrinology

## 2016-08-04 DIAGNOSIS — Z794 Long term (current) use of insulin: Principal | ICD-10-CM

## 2016-08-04 DIAGNOSIS — E1141 Type 2 diabetes mellitus with diabetic mononeuropathy: Secondary | ICD-10-CM

## 2016-08-04 MED ORDER — LEVOTHYROXINE SODIUM 112 MCG PO TABS
112.0000 ug | ORAL_TABLET | Freq: Every day | ORAL | 5 refills | Status: DC
Start: 2016-08-04 — End: 2016-11-25

## 2016-08-05 ENCOUNTER — Other Ambulatory Visit: Payer: Self-pay | Admitting: *Deleted

## 2016-08-11 MED ORDER — ACCU-CHEK AVIVA PLUS W/DEVICE KIT: PACK | 0 refills | Status: DC

## 2016-08-11 NOTE — Telephone Encounter (Signed)
Patients glucometer was reordered to the [facility].

## 2016-08-15 DIAGNOSIS — R413 Other amnesia: Secondary | ICD-10-CM | POA: Insufficient documentation

## 2016-08-31 ENCOUNTER — Other Ambulatory Visit (HOSPITAL_COMMUNITY): Payer: Self-pay | Admitting: Psychiatry

## 2016-08-31 DIAGNOSIS — F32A Depression, unspecified: Secondary | ICD-10-CM

## 2016-08-31 DIAGNOSIS — F329 Major depressive disorder, single episode, unspecified: Secondary | ICD-10-CM

## 2016-09-04 NOTE — Progress Notes (Signed)
Subjective:    Patient ID: Sonia Skinner, female    DOB: 07-29-1951, 66 y.o.   MRN: [id number]  HPI Pt returns for f/u of diabetes mellitus: DM type: Insulin-requiring type 2 (but she is prob is evolving type 1) Dx'ed: 9357 Complications: polyneuropathy, PAD, and CVA Therapy: insulin since 2000 GDM: never DKA: never Severe hypoglycemia: never Pancreatitis: never Other: in 2017, she had episode of nonketotic hyperosmolar hyperglycemic state; She is a Pharmacist, hospital, and has little time at lunch, but she takes multiple daily injections. She says she cannot take insulin at lunch, but she does eat lunch.   Interval history: no cbg record, but states cbg's vary from 71-300's.  It is lowest before lunch, and highest in the afternoon.   Pt also has multinodular goiter (dx'ed 2017; left nodule bx was beth cat 1; plan is to repeat US in mid to late-2018; she takes synthroid for hypothyoidism).   Past Medical History:  Diagnosis Date  . Anxiety   . Carotid artery occlusion   . Complication of anesthesia    ALLERY TO ESTER BASE  . Depression    early 25s  . Diabetes mellitus    INSULIN DEPENDENT  . GERD (gastroesophageal reflux disease)   . Headache   . Hypertension   . Stroke [facility]) March 2015    left MCA infarct  . Thyroid disease     Past Surgical History:  Procedure Laterality Date  . ABDOMINAL HYSTERECTOMY    . ANTERIOR FIXATION AND POSTERIOR MICRODISCECTOMY CERVICAL SPINE  1999  . CAROTID ENDARTERECTOMY Left 11-04-13   cea  . ENDARTERECTOMY Left 11/04/2013    Social History   Social History  . Marital status: Legally Separated    Spouse name: N/A  . Number of children: 0  . Years of education: College   Occupational History  . [employer] History Main Topics  . Smoking status: Former Smoker    Packs/day: 0.20    Years: 20.00    Types: Cigarettes    Quit date: 11/01/2013  . Smokeless tobacco: Never Used  . Alcohol use No  . Drug use: No  . Sexual  activity: Not on file   Other Topics Concern  . Not on file   Social History Narrative   Patient lives at home with sister.   Caffeine Use: 2 cups daily; sodas occasionally    Current Outpatient Prescriptions on File Prior to Visit  Medication Sig Dispense Refill  . ACCU-CHEK SOFTCLIX LANCETS lancets ACHS for E11.9 100 each 12  . albuterol (PROVENTIL HFA;VENTOLIN HFA) 108 (90 Base) MCG/ACT inhaler Inhale 1-2 puffs into the lungs every 6 (six) hours as needed for wheezing or shortness of breath.    Marland Kitchen amitriptyline (ELAVIL) 50 MG tablet Take 1.5 tablets (75 mg total) by mouth at bedtime. 45 tablet 0  . aspirin 325 MG tablet Take 1 tablet (325 mg total) by mouth daily.    Marland Kitchen atorvastatin (LIPITOR) 40 MG tablet Take 1 tablet (40 mg total) by mouth daily. 30 tablet 2  . Blood Glucose Monitoring Suppl (ACCU-CHEK AVIVA PLUS) w/Device KIT ACHS for E11.9 1 kit 0  . escitalopram (LEXAPRO) 20 MG tablet Take 1 tablet (20 mg total) by mouth daily. 30 tablet 2  . glucose blood (ACCU-CHEK AVIVA) test strip Check blood sugar AC and qHS for E11.9 100 each 12  . Insulin Glargine (LANTUS SOLOSTAR) 100 UNIT/ML Solostar Pen Inject 35 Units into the skin at bedtime. 5 pen PRN  .  INSULIN SYRINGE .5CC/28G 28G X 1/2" 0.5 ML MISC Give insulin once per night 100 each 12  . Lancet Devices (ACCU-CHEK SOFTCLIX) lancets Check blood sugar ACHS for E11.9 1 each 11  . levothyroxine (SYNTHROID, LEVOTHROID) 112 MCG tablet Take 1 tablet (112 mcg total) by mouth daily. 30 tablet 5  . liraglutide 18 MG/3ML SOPN Inject 0.3 mLs (1.8 mg total) into the skin daily with breakfast. 6 mL 11  . loratadine (CLARITIN) 10 MG tablet TAKE ONE TABLET BY MOUTH ONCE DAILY (Patient taking differently: TAKE ONE TABLET BY MOUTH ONCE DAILY AS NEEDED FOR ALLERGIES) 30 tablet 2  . omeprazole (PRILOSEC) 40 MG capsule Take 1 capsule (40 mg total) by mouth daily. 30 capsule 2   No current facility-administered medications on file prior to visit.      Allergies  Allergen Reactions  . Anesthetics, Ester Anaphylaxis    Family History  Problem Relation Age of Onset  . Diabetes Mother   . Heart disease Mother     Before age 24  . Cancer Father     Lung  . Hypertension Sister   . Diabetes Sister   . Diabetes Sister   . Diabetes Sister     BP (!) 166/80   Pulse (!) 108   Ht '5\' 2"'$  (1.575 m)   Wt 153 lb (69.4 kg)   SpO2 97%   BMI 27.98 kg/m     Review of Systems Denies LOC    Objective:   Physical Exam VITAL SIGNS:  See vs page GENERAL: no distress Pulses: dorsalis pedis intact bilat.   MSK: no deformity of the feet CV: no leg edema Skin:  no ulcer on the feet.  normal color and temp on the feet. Neuro: sensation is intact to touch on the feet.  Ext: There is bilateral onychomycosis of the toenails.    A1c=10.3%     Assessment & Plan:  Insulin-requiring type 2 DM, with PAD: he needs increased rx.   Patient is advised the following: Patient Instructions  You will be due to recheck the ultrasound late this year.  I'll see you next time.  check your blood sugar twice a day.  vary the time of day when you check, between before the 3 meals, and at bedtime.  also check if you have symptoms of your blood sugar being too high or too low.  please keep a record of the readings and bring it to your next appointment here (or you can bring the meter itself).  You can write it on any piece of paper.  please call us sooner if your blood sugar goes below 70, or if you have a lot of readings over 200.   Please continue the same victoza, and: continue lantus, 35 units at bedtime, and:    change novolog to 3 times a day (just before meals) 03-19-29 units.   Please come back for a follow-up appointment in 2 months.   Here is a brochure about the "V-GO disposable pump."  This might be good for you.  Please let us know if you want to try it.   please call [phone] ([place] physician referral line), to get an appointment with a new  primary doctor. This is important, because your blood pressure is high.

## 2016-09-07 ENCOUNTER — Encounter: Payer: Self-pay | Admitting: Endocrinology

## 2016-09-07 ENCOUNTER — Ambulatory Visit: Payer: Self-pay | Admitting: Endocrinology

## 2016-09-07 ENCOUNTER — Ambulatory Visit (INDEPENDENT_AMBULATORY_CARE_PROVIDER_SITE_OTHER): Payer: Medicare Other | Admitting: Endocrinology

## 2016-09-07 VITALS — BP 166/80 | HR 108 | Ht 62.0 in | Wt 153.0 lb

## 2016-09-07 DIAGNOSIS — Z794 Long term (current) use of insulin: Secondary | ICD-10-CM

## 2016-09-07 DIAGNOSIS — E1141 Type 2 diabetes mellitus with diabetic mononeuropathy: Secondary | ICD-10-CM | POA: Diagnosis not present

## 2016-09-07 LAB — POCT GLYCOSYLATED HEMOGLOBIN (HGB A1C): Hemoglobin A1C: 10.3

## 2016-09-07 MED ORDER — INSULIN ASPART 100 UNIT/ML FLEXPEN: PEN_INJECTOR | SUBCUTANEOUS | 11 refills | Status: DC

## 2016-09-07 NOTE — Patient Instructions (Addendum)
You will be due to recheck the ultrasound late this year.  I'll see you next time.  check your blood sugar twice a day.  vary the time of day when you check, between before the 3 meals, and at bedtime.  also check if you have symptoms of your blood sugar being too high or too low.  please keep a record of the readings and bring it to your next appointment here (or you can bring the meter itself).  You can write it on any piece of paper.  please call us sooner if your blood sugar goes below 70, or if you have a lot of readings over 200.   Please continue the same victoza, and: continue lantus, 35 units at bedtime, and:    change novolog to 3 times a day (just before meals) 03-19-29 units.   Please come back for a follow-up appointment in 2 months.   Here is a brochure about the "V-GO disposable pump."  This might be good for you.  Please let us know if you want to try it.   please call [phone] ([place] physician referral line), to get an appointment with a new primary doctor. This is important, because your blood pressure is high.

## 2016-09-15 ENCOUNTER — Encounter (HOSPITAL_COMMUNITY): Payer: Self-pay | Admitting: Psychology

## 2016-09-15 NOTE — Progress Notes (Signed)
Sonia Skinner is a 66 y.o. female patient who is discharged from counseling as didn't return after initial assessment.  Outpatient Therapist Discharge Summary  PAULA BUSENBARK    Jan 17, 1951   Admission Date: 02/14/16   Discharge Date:  09/15/16 Reason for Discharge:  Didn't return Diagnosis:  MDD  Comments:  Pt will continue as scheduled w/ Dr. Donnie Coffin  .        Jan Fireman, LPC

## 2016-10-02 ENCOUNTER — Other Ambulatory Visit: Payer: Self-pay | Admitting: Endocrinology

## 2016-10-02 NOTE — Telephone Encounter (Signed)
Please refer to PCP 

## 2016-10-05 ENCOUNTER — Other Ambulatory Visit: Payer: Self-pay | Admitting: Endocrinology

## 2016-10-05 DIAGNOSIS — E785 Hyperlipidemia, unspecified: Secondary | ICD-10-CM

## 2016-10-06 ENCOUNTER — Other Ambulatory Visit (HOSPITAL_COMMUNITY): Payer: Self-pay | Admitting: Psychiatry

## 2016-10-06 DIAGNOSIS — F331 Major depressive disorder, recurrent, moderate: Secondary | ICD-10-CM

## 2016-10-06 DIAGNOSIS — F32A Depression, unspecified: Secondary | ICD-10-CM

## 2016-10-06 DIAGNOSIS — F329 Major depressive disorder, single episode, unspecified: Secondary | ICD-10-CM

## 2016-10-15 ENCOUNTER — Other Ambulatory Visit: Payer: Self-pay

## 2016-10-15 ENCOUNTER — Other Ambulatory Visit (HOSPITAL_COMMUNITY): Payer: Self-pay | Admitting: Psychiatry

## 2016-10-15 DIAGNOSIS — E785 Hyperlipidemia, unspecified: Secondary | ICD-10-CM

## 2016-10-15 MED ORDER — ATORVASTATIN CALCIUM 40 MG PO TABS: 40.0000 mg | ORAL_TABLET | Freq: Every day | ORAL | 1 refills | Status: DC

## 2016-10-21 ENCOUNTER — Encounter (HOSPITAL_COMMUNITY): Payer: Self-pay | Admitting: Psychiatry

## 2016-10-21 ENCOUNTER — Ambulatory Visit (INDEPENDENT_AMBULATORY_CARE_PROVIDER_SITE_OTHER): Payer: Medicare Other | Admitting: Psychiatry

## 2016-10-21 VITALS — BP 190/84 | HR 84 | Ht 62.0 in | Wt 157.6 lb

## 2016-10-21 DIAGNOSIS — Z87891 Personal history of nicotine dependence: Secondary | ICD-10-CM

## 2016-10-21 DIAGNOSIS — Z888 Allergy status to other drugs, medicaments and biological substances status: Secondary | ICD-10-CM

## 2016-10-21 DIAGNOSIS — Z794 Long term (current) use of insulin: Secondary | ICD-10-CM

## 2016-10-21 DIAGNOSIS — F419 Anxiety disorder, unspecified: Secondary | ICD-10-CM

## 2016-10-21 DIAGNOSIS — E119 Type 2 diabetes mellitus without complications: Secondary | ICD-10-CM | POA: Diagnosis not present

## 2016-10-21 DIAGNOSIS — Z79899 Other long term (current) drug therapy: Secondary | ICD-10-CM

## 2016-10-21 DIAGNOSIS — F331 Major depressive disorder, recurrent, moderate: Secondary | ICD-10-CM

## 2016-10-21 MED ORDER — LAMOTRIGINE 25 MG PO TABS: ORAL_TABLET | ORAL | 1 refills | Status: DC

## 2016-10-21 MED ORDER — AMITRIPTYLINE HCL 50 MG PO TABS: 75.0000 mg | ORAL_TABLET | Freq: Every day | ORAL | 1 refills | Status: DC

## 2016-10-21 MED ORDER — ESCITALOPRAM OXALATE 20 MG PO TABS: 20.0000 mg | ORAL_TABLET | Freq: Every day | ORAL | 1 refills | Status: DC

## 2016-10-21 NOTE — Progress Notes (Signed)
[place] MD/PA/NP OP Progress Note  10/21/2016 4:12 PM Sonia Skinner  MRN:  [id number]  Chief Complaint:  Subjective:  I'm getting easily irritable and agitated.  My job is a stressful.  HPI: Sonia Skinner came for her follow-up appointment.  On her last visit we increase amitriptyline to 75 mg.  He she's she is sleeping better but she remains very anxious, easily irritable and stressed about her job.  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  Though she denies any aggressive behavior but endorsed intense and anxious.  She's also taking Lexapro 20 mg.  Recently she saw her primary care physician and her hemoglobin A1c was 10.3.  She endorse her appetite is okay.  She denies any paranoia or any hallucination.  She denies any suicidal thoughts.  Today her blood pressure was very high but she denies any chest pain, headaches, shortness of breath.  Her blood pressure has been persistent high and she has not been on any purpose or medication.  She has no tremors or shakes.  She is concerned about her physical health .  She has tingling and numbness in her left hand.  Patient has history of stroke.    Visit Diagnosis:    ICD-9-CM ICD-10-CM   1. Moderate episode of recurrent major depressive disorder (HCC) 296.32 F33.1 escitalopram (LEXAPRO) 20 MG tablet     amitriptyline (ELAVIL) 50 MG tablet     lamoTRIgine (LAMICTAL) 25 MG tablet    Past Psychiatric History: Reviewed.   Patient remember taking antidepressant 20 years ago.  She has one psychiatric admission at [facility] at that time after taking overdose on medication.  She was seen at [facility] in 2017 2008 and given Strattera, Adderall, Zoloft, Celexa .  She denies any history of mania, psychosis, hallucination or any self abusive behavior.  She was never tested for ADD.  In 2001 she moved to [facility] and she has seen psychiatrist there.  She was given Wellbutrin to stop smoking for past few years.  Patient  denies any history of physical, sexual, verbal learning emotional abuse.  Past Medical History:  Past Medical History:  Diagnosis Date  . Anxiety   . Carotid artery occlusion   . Complication of anesthesia    ALLERY TO ESTER BASE  . Depression    early 42s  . Diabetes mellitus    INSULIN DEPENDENT  . GERD (gastroesophageal reflux disease)   . Headache   . Hypertension   . Stroke [facility]) March 2015    left MCA infarct  . Thyroid disease     Past Surgical History:  Procedure Laterality Date  . ABDOMINAL HYSTERECTOMY    . ANTERIOR FIXATION AND POSTERIOR MICRODISCECTOMY CERVICAL SPINE  1999  . CAROTID ENDARTERECTOMY Left 11-04-13   cea  . ENDARTERECTOMY Left 11/04/2013    Family Psychiatric History: Reviewed.   Family History:  Family History  Problem Relation Age of Onset  . Diabetes Mother   . Heart disease Mother     Before age 32  . Cancer Father     Lung  . Hypertension Sister   . Diabetes Sister   . Diabetes Sister   . Diabetes Sister     Social History:  Social History   Social History  . Marital status: Legally Separated    Spouse name: N/A  . Number of children: 0  . Years of education: College   Occupational History  . [employer]  Social History Main Topics  . Smoking status: Former Smoker    Packs/day: 0.20    Years: 20.00    Types: Cigarettes    Quit date: 11/01/2013  . Smokeless tobacco: Never Used  . Alcohol use No  . Drug use: No  . Sexual activity: Not Asked   Other Topics Concern  . None   Social History Narrative   Patient lives at home with sister.   Caffeine Use: 2 cups daily; sodas occasionally    Allergies:  Allergies  Allergen Reactions  . Anesthetics, Ester Anaphylaxis    Metabolic Disorder Labs: Recent Results (from the past 2160 hour(s))  POCT glycosylated hemoglobin (Hb A1C)     Status: None   Collection Time: 09/07/16  4:37 PM  Result Value Ref Range   Hemoglobin A1C 10.3    Lab Results  Component  Value Date   HGBA1C 10.3 09/07/2016   MPG 255 (H) 11/02/2013   MPG 249 (H) 11/01/2013   No results found for: PROLACTIN Lab Results  Component Value Date   CHOL 184 09/18/2014   TRIG 147 09/18/2014   HDL 50 09/18/2014   CHOLHDL 3.7 09/18/2014   VLDL 29 09/18/2014   LDLCALC 105 (H) 09/18/2014   LDLCALC 147 (H) 11/02/2013     Current Medications: Current Outpatient Prescriptions  Medication Sig Dispense Refill  . ACCU-CHEK SOFTCLIX LANCETS lancets ACHS for E11.9 100 each 12  . albuterol (PROVENTIL HFA;VENTOLIN HFA) 108 (90 Base) MCG/ACT inhaler Inhale 1-2 puffs into the lungs every 6 (six) hours as needed for wheezing or shortness of breath.    Marland Kitchen amitriptyline (ELAVIL) 50 MG tablet Take 1.5 tablets (75 mg total) by mouth at bedtime. 45 tablet 0  . aspirin 325 MG tablet Take 1 tablet (325 mg total) by mouth daily.    Marland Kitchen atorvastatin (LIPITOR) 40 MG tablet Take 1 tablet (40 mg total) by mouth daily. 90 tablet 1  . Blood Glucose Monitoring Suppl (ACCU-CHEK AVIVA PLUS) w/Device KIT ACHS for E11.9 1 kit 0  . escitalopram (LEXAPRO) 20 MG tablet Take 1 tablet (20 mg total) by mouth daily. 30 tablet 2  . glucose blood (ACCU-CHEK AVIVA) test strip Check blood sugar AC and qHS for E11.9 100 each 12  . insulin aspart (NOVOLOG FLEXPEN) 100 UNIT/ML FlexPen 8 units with breakfast, 4 with lunch, and 30 units with supper, and pen needles 4/day. 15 mL 11  . Insulin Glargine (LANTUS SOLOSTAR) 100 UNIT/ML Solostar Pen Inject 35 Units into the skin at bedtime. 5 pen PRN  . INSULIN SYRINGE .5CC/28G 28G X 1/2" 0.5 ML MISC Give insulin once per night 100 each 12  . Lancet Devices (ACCU-CHEK SOFTCLIX) lancets Check blood sugar ACHS for E11.9 1 each 11  . levothyroxine (SYNTHROID, LEVOTHROID) 112 MCG tablet Take 1 tablet (112 mcg total) by mouth daily. 30 tablet 5  . liraglutide 18 MG/3ML SOPN Inject 0.3 mLs (1.8 mg total) into the skin daily with breakfast. 6 mL 11  . loratadine (CLARITIN) 10 MG tablet TAKE  ONE TABLET BY MOUTH ONCE DAILY (Patient taking differently: TAKE ONE TABLET BY MOUTH ONCE DAILY AS NEEDED FOR ALLERGIES) 30 tablet 2  . omeprazole (PRILOSEC) 40 MG capsule TAKE ONE CAPSULE BY MOUTH ONCE DAILY 30 capsule 2   No current facility-administered medications for this visit.     Neurologic: Headache: No Seizure: No Paresthesias: Yes  Musculoskeletal: Strength & Muscle Tone: decreased Gait & Station: normal Patient leans: N/A  Psychiatric Specialty Exam: Review of Systems  Constitutional: Negative.   HENT: Negative.   Respiratory: Negative for shortness of breath.   Cardiovascular: Negative for chest pain and palpitations.  Gastrointestinal: Negative for heartburn.  Skin: Negative for itching and rash.  Neurological: Positive for tingling and speech change.  Psychiatric/Behavioral: Positive for depression. The patient is nervous/anxious.     Blood pressure (!) 190/84, pulse 84, height '5\' 2"'$  (1.575 m), weight 157 lb 9.6 oz (71.5 kg), SpO2 91 %.Body mass index is 28.83 kg/m.  General Appearance: Casual  Eye Contact:  Fair  Speech:  Slow  Volume:  Normal  Mood:  Anxious and Depressed  Affect:  Constricted  Thought Process:  Goal Directed  Orientation:  Full (Time, Place, and Person)  Thought Content: Rumination   Suicidal Thoughts:  No  Homicidal Thoughts:  No  Memory:  Immediate;   Fair Recent;   Fair Remote;   Fair  Judgement:  Good  Insight:  Good  Psychomotor Activity:  Decreased  Concentration:  Concentration: Fair and Attention Span: Fair  Recall:  [employer] of Knowledge: Good  Language: Good  Akathisia:  No  Handed:  Right  AIMS (if indicated):  0  Assets:  Communication Skills Desire for Improvement Housing Resilience  ADL's:  Intact  Cognition: WNL  Sleep:  Fair    Assessment: Major depressive disorder, recurrent.  Anxiety disorder NOS.   Patient has noticed increased irritability and frustration.  Despite taking Lexapro and amitriptyline  she has not seen any improvement in her mood.  I review her blood work results and her hemoglobin A1c is again slightly high.  She admitted compliant with insulin.  Today her blood pressure was also high and she is not taking any blood pressure medication.  Plan: I recommended to add Lamictal 25 mg daily for 1 week and than 50 mg daily to help her mood irritability and depression.  Continue Lexapro 20 mg daily and amitriptyline 75 mg at bedtime.  Patient does not have any side effects including any tremors or shakes.  I discuss if Lamictal causes rash that she needed to stop the medication immediately.  I review her blood work results and collateral information from other provider.  I also encouraged to see her primary care physician for the measurement of hypertension.  Her blood pressure is very high today but she denies any chest pain, shortness of breath or any headaches.  Patient has not seen Archie Endo and she like to have medication adjustment first before she go back to therapy.  Discuss safety plan that anytime having active suicidal thoughts or homicidal thoughts and she need to call 911 or go to the local emergency room.  Follow-up in 4-6 weeks.  I will also forward my note to her primary care physician  Tomasz Steeves T., MD 10/21/2016, 4:12 PM

## 2016-11-03 ENCOUNTER — Ambulatory Visit: Payer: Self-pay | Admitting: Endocrinology

## 2016-11-04 ENCOUNTER — Telehealth: Payer: Self-pay | Admitting: Endocrinology

## 2016-11-04 NOTE — Telephone Encounter (Signed)
Patient no showed today's appt. Please advise on how to follow up. °A. No follow up necessary. °B. Follow up urgent. Contact patient immediately. °C. Follow up necessary. Contact patient and schedule visit in ___ days. °D. Follow up advised. Contact patient and schedule visit in ____weeks. ° °

## 2016-11-04 NOTE — Telephone Encounter (Signed)
Please come back for a follow-up appointment in 1 month.  

## 2016-11-09 ENCOUNTER — Other Ambulatory Visit: Payer: Self-pay | Admitting: Internal Medicine

## 2016-11-16 ENCOUNTER — Telehealth: Payer: Self-pay | Admitting: Internal Medicine

## 2016-11-16 NOTE — Telephone Encounter (Signed)
Left patient vm to call back to make appt

## 2016-11-16 NOTE — Telephone Encounter (Signed)
yes

## 2016-11-16 NOTE — Telephone Encounter (Signed)
Patient requesting Dr. Ronnald Ramp to take patient on.

## 2016-11-17 DIAGNOSIS — R10814 Left lower quadrant abdominal tenderness: Secondary | ICD-10-CM | POA: Diagnosis not present

## 2016-11-24 ENCOUNTER — Encounter: Payer: Self-pay | Admitting: Internal Medicine

## 2016-11-24 ENCOUNTER — Other Ambulatory Visit (INDEPENDENT_AMBULATORY_CARE_PROVIDER_SITE_OTHER): Payer: Medicare Other

## 2016-11-24 ENCOUNTER — Ambulatory Visit (INDEPENDENT_AMBULATORY_CARE_PROVIDER_SITE_OTHER): Payer: Medicare Other | Admitting: Internal Medicine

## 2016-11-24 VITALS — BP 160/82 | HR 95 | Temp 98.5°F | Resp 14 | Ht 62.0 in | Wt 159.0 lb

## 2016-11-24 DIAGNOSIS — I10 Essential (primary) hypertension: Secondary | ICD-10-CM | POA: Diagnosis not present

## 2016-11-24 DIAGNOSIS — G43011 Migraine without aura, intractable, with status migrainosus: Secondary | ICD-10-CM | POA: Insufficient documentation

## 2016-11-24 DIAGNOSIS — E113521 Type 2 diabetes mellitus with proliferative diabetic retinopathy with traction retinal detachment involving the macula, right eye: Secondary | ICD-10-CM | POA: Diagnosis not present

## 2016-11-24 DIAGNOSIS — D539 Nutritional anemia, unspecified: Secondary | ICD-10-CM

## 2016-11-24 DIAGNOSIS — E785 Hyperlipidemia, unspecified: Secondary | ICD-10-CM

## 2016-11-24 DIAGNOSIS — E876 Hypokalemia: Secondary | ICD-10-CM | POA: Diagnosis not present

## 2016-11-24 DIAGNOSIS — E038 Other specified hypothyroidism: Secondary | ICD-10-CM

## 2016-11-24 DIAGNOSIS — E559 Vitamin D deficiency, unspecified: Secondary | ICD-10-CM | POA: Diagnosis not present

## 2016-11-24 DIAGNOSIS — Z23 Encounter for immunization: Secondary | ICD-10-CM

## 2016-11-24 LAB — LIPID PANEL
Cholesterol: 173 mg/dL (ref 0–200)
HDL: 51.6 mg/dL (ref >39.00)
LDL Cholesterol: 83 mg/dL (ref 0–99)
NonHDL: 121.02
Total CHOL/HDL Ratio: 3
Triglycerides: 192 mg/dL — ABNORMAL HIGH (ref 0.0–149.0)
VLDL: 38.4 mg/dL (ref 0.0–40.0)

## 2016-11-24 LAB — CBC WITH DIFFERENTIAL/PLATELET
Basophils Absolute: 0.1 10*3/uL (ref 0.0–0.1)
Basophils Relative: 0.9 % (ref 0.0–3.0)
Eosinophils Absolute: 0.3 10*3/uL (ref 0.0–0.7)
Eosinophils Relative: 2.7 % (ref 0.0–5.0)
HCT: 41.1 % (ref 36.0–46.0)
Hemoglobin: 13.3 g/dL (ref 12.0–15.0)
Lymphocytes Relative: 24.6 % (ref 12.0–46.0)
Lymphs Abs: 2.6 10*3/uL (ref 0.7–4.0)
MCHC: 32.2 g/dL (ref 30.0–36.0)
MCV: 88.8 fl (ref 78.0–100.0)
Monocytes Absolute: 0.8 10*3/uL (ref 0.1–1.0)
Monocytes Relative: 7.5 % (ref 3.0–12.0)
Neutro Abs: 6.7 10*3/uL (ref 1.4–7.7)
Neutrophils Relative %: 64.3 % (ref 43.0–77.0)
Platelets: 220 10*3/uL (ref 150.0–400.0)
RBC: 4.64 Mil/uL (ref 3.87–5.11)
RDW: 14.3 % (ref 11.5–15.5)
WBC: 10.5 10*3/uL (ref 4.0–10.5)

## 2016-11-24 LAB — COMPREHENSIVE METABOLIC PANEL
ALT: 18 U/L (ref 0–35)
AST: 16 U/L (ref 0–37)
Albumin: 4 g/dL (ref 3.5–5.2)
Alkaline Phosphatase: 134 U/L — ABNORMAL HIGH (ref 39–117)
BUN: 7 mg/dL (ref 6–23)
CO2: 28 mEq/L (ref 19–32)
Calcium: 8.9 mg/dL (ref 8.4–10.5)
Chloride: 103 mEq/L (ref 96–112)
Creatinine, Ser: 0.83 mg/dL (ref 0.40–1.20)
GFR: 88.09 mL/min (ref >60.00)
Glucose, Bld: 121 mg/dL — ABNORMAL HIGH (ref 70–99)
Potassium: 3.2 mEq/L — ABNORMAL LOW (ref 3.5–5.1)
Sodium: 140 mEq/L (ref 135–145)
Total Bilirubin: 0.2 mg/dL (ref 0.2–1.2)
Total Protein: 7 g/dL (ref 6.0–8.3)

## 2016-11-24 LAB — VITAMIN B12: Vitamin B-12: >1500 pg/mL — ABNORMAL HIGH (ref 211–911)

## 2016-11-24 LAB — TSH: TSH: 9.41 u[IU]/mL — ABNORMAL HIGH (ref 0.35–4.50)

## 2016-11-24 LAB — FOLATE: Folate: 17.2 ng/mL (ref >5.9)

## 2016-11-24 LAB — IBC PANEL
Iron: 121 ug/dL (ref 42–145)
Saturation Ratios: 33.8 % (ref 20.0–50.0)
Transferrin: 256 mg/dL (ref 212.0–360.0)

## 2016-11-24 LAB — FERRITIN: Ferritin: 36.4 ng/mL (ref 10.0–291.0)

## 2016-11-24 LAB — VITAMIN D 25 HYDROXY (VIT D DEFICIENCY, FRACTURES): VITD: 11.94 ng/mL — ABNORMAL LOW (ref 30.00–100.00)

## 2016-11-24 MED ORDER — SUMATRIPTAN SUCCINATE 50 MG PO TABS: 50.0000 mg | ORAL_TABLET | ORAL | 2 refills | Status: DC | PRN

## 2016-11-24 MED ORDER — VALSARTAN 320 MG PO TABS: 320.0000 mg | ORAL_TABLET | Freq: Every day | ORAL | 1 refills | Status: DC

## 2016-11-24 MED ORDER — CHLORTHALIDONE 25 MG PO TABS: 25.0000 mg | ORAL_TABLET | Freq: Every day | ORAL | 1 refills | Status: DC

## 2016-11-24 MED ORDER — EXENATIDE ER 2 MG/0.85ML ~~LOC~~ AUIJ: 1.0000 | AUTO-INJECTOR | SUBCUTANEOUS | 3 refills | Status: DC

## 2016-11-24 NOTE — Progress Notes (Signed)
Subjective:  Patient ID: Sonia Skinner, female    DOB: 05-05-1951  Age: 66 y.o. MRN: [id number]  CC: Hypertension; Hypothyroidism; Hyperlipidemia; Diabetes; and Anemia  NEW TO ME  Sonia Skinner presents for concerns about a headache for 1 week. She has a prior history of migraine headaches and says this is not the worst headache she has ever had. She was previously seen by neurology and was placed on amitriptyline which has helped some. Her headaches are not very frequent but this one has lasted a week. She describes it as an aching/throbbing that started in the back of her head that radiates to the top of her head. She's had no nausea, vomiting, visual disturbance, photo or phonophobia, paresthesias. She has not taken anything to control the symptoms.  History [name] has a past medical history of Anxiety; Carotid artery occlusion; Complication of anesthesia; Depression; Diabetes mellitus; GERD (gastroesophageal reflux disease); Headache; Hypertension; Stroke [facility]) (March 2015); and Thyroid disease.   She has a past surgical history that includes Abdominal hysterectomy; Endarterectomy (Left, 11/04/2013); Carotid endarterectomy (Left, 11-04-13); and Anterior fixation and posterior microdiscectomy cervical spine (1999).   Her family history includes Cancer in her father; Diabetes in her mother, sister, sister, and sister; Heart disease in her mother; Hypertension in her sister.She reports that she quit smoking about 3 years ago. Her smoking use included Cigarettes. She has a 4.00 pack-year smoking history. She has never used smokeless tobacco. She reports that she does not drink alcohol or use drugs.  Outpatient Medications Prior to Visit  Medication Sig Dispense Refill  . ACCU-CHEK SOFTCLIX LANCETS lancets ACHS for E11.9 100 each 12  . albuterol (PROVENTIL HFA;VENTOLIN HFA) 108 (90 Base) MCG/ACT inhaler Inhale 1-2 puffs into the lungs every 6 (six) hours as needed for wheezing or shortness of  breath.    Marland Kitchen amitriptyline (ELAVIL) 50 MG tablet Take 1.5 tablets (75 mg total) by mouth at bedtime. 45 tablet 1  . aspirin 325 MG tablet Take 1 tablet (325 mg total) by mouth daily.    Marland Kitchen atorvastatin (LIPITOR) 40 MG tablet Take 1 tablet (40 mg total) by mouth daily. 90 tablet 1  . Blood Glucose Monitoring Suppl (ACCU-CHEK AVIVA PLUS) w/Device KIT ACHS for E11.9 1 kit 0  . escitalopram (LEXAPRO) 20 MG tablet Take 1 tablet (20 mg total) by mouth daily. 30 tablet 1  . glucose blood (ACCU-CHEK AVIVA) test strip Check blood sugar AC and qHS for E11.9 100 each 12  . insulin aspart (NOVOLOG FLEXPEN) 100 UNIT/ML FlexPen 8 units with breakfast, 4 with lunch, and 30 units with supper, and pen needles 4/day. 15 mL 11  . Insulin Glargine (LANTUS SOLOSTAR) 100 UNIT/ML Solostar Pen Inject 35 Units into the skin at bedtime. 5 pen PRN  . INSULIN SYRINGE .5CC/28G 28G X 1/2" 0.5 ML MISC Give insulin once per night 100 each 12  . lamoTRIgine (LAMICTAL) 25 MG tablet Take 1 tab daily for 1 week and than 2 tab daily 60 tablet 1  . Lancet Devices (ACCU-CHEK SOFTCLIX) lancets Check blood sugar ACHS for E11.9 1 each 11  . loratadine (CLARITIN) 10 MG tablet TAKE ONE TABLET BY MOUTH ONCE DAILY (Patient taking differently: TAKE ONE TABLET BY MOUTH ONCE DAILY AS NEEDED FOR ALLERGIES) 30 tablet 2  . omeprazole (PRILOSEC) 40 MG capsule TAKE ONE CAPSULE BY MOUTH ONCE DAILY 30 capsule 2  . levothyroxine (SYNTHROID, LEVOTHROID) 112 MCG tablet Take 1 tablet (112 mcg total) by mouth daily. 30 tablet  5  . liraglutide 18 MG/3ML SOPN Inject 0.3 mLs (1.8 mg total) into the skin daily with breakfast. 6 mL 11   No facility-administered medications prior to visit.     ROS Review of Systems  Constitutional: Negative.  Negative for activity change, appetite change, chills, diaphoresis, fatigue and unexpected weight change.  HENT: Negative.   Eyes: Negative for visual disturbance.  Respiratory: Negative for cough, chest tightness and  shortness of breath.   Cardiovascular: Negative for chest pain and leg swelling.  Gastrointestinal: Negative for abdominal pain, constipation, diarrhea, nausea and vomiting.  Endocrine: Negative for polydipsia, polyphagia and polyuria.  Genitourinary: Negative.  Negative for difficulty urinating.  Musculoskeletal: Negative.  Negative for back pain, myalgias and neck pain.  Skin: Negative.   Allergic/Immunologic: Negative.   Neurological: Positive for headaches. Negative for dizziness, tremors, syncope, facial asymmetry, speech difficulty, weakness and numbness.  Hematological: Negative for adenopathy. Does not bruise/bleed easily.  Psychiatric/Behavioral: Negative.     Objective:  BP (!) 160/82 (BP Location: Left Arm, Patient Position: Sitting, Cuff Size: Normal)   Pulse 95   Temp 98.5 F (36.9 C) (Oral)   Resp 14   Ht _0  (1.575 m)   Wt 159 lb (72.1 kg)   SpO2 97%   BMI 29.08 kg/m   Physical Exam  Constitutional: She is oriented to person, place, and time.  HENT:  Mouth/Throat: Oropharynx is clear and moist. No oropharyngeal exudate.  Eyes: Conjunctivae and EOM are normal. Pupils are equal, round, and reactive to light. Right eye exhibits no discharge. Left eye exhibits no discharge. No scleral icterus.  Neck: Normal range of motion. Neck supple. No JVD present. No tracheal deviation present. No thyromegaly present.  Cardiovascular: Normal rate, regular rhythm, normal heart sounds and intact distal pulses.  Exam reveals no gallop and no friction rub.   No murmur heard. Pulmonary/Chest: Effort normal and breath sounds normal. No stridor. No respiratory distress. She has no wheezes. She has no rales. She exhibits no tenderness.  Abdominal: Soft. Bowel sounds are normal. She exhibits no distension and no mass. There is no tenderness. There is no rebound and no guarding.  Musculoskeletal: Normal range of motion. She exhibits no edema, tenderness or deformity.  Lymphadenopathy:     She has no cervical adenopathy.  Neurological: She is alert and oriented to person, place, and time. She has normal reflexes. She displays no atrophy, no tremor and normal reflexes. No cranial nerve deficit or sensory deficit. She exhibits normal muscle tone. She displays no seizure activity. Coordination and gait normal. She displays no Babinski's sign on the right side. She displays no Babinski's sign on the left side.  Skin: Skin is warm and dry. No rash noted. She is not diaphoretic. No erythema. No pallor.  Psychiatric: She has a normal mood and affect. Her behavior is normal. Judgment and thought content normal.  Vitals reviewed.   Lab Results  Component Value Date   WBC 10.5 11/24/2016   HGB 13.3 11/24/2016   HCT 41.1 11/24/2016   PLT 220.0 11/24/2016   GLUCOSE 121 (H) 11/24/2016   CHOL 173 11/24/2016   TRIG 192.0 (H) 11/24/2016   HDL 51.60 11/24/2016   LDLDIRECT 141 (H) 03/04/2010   LDLCALC 83 11/24/2016   ALT 18 11/24/2016   AST 16 11/24/2016   NA 140 11/24/2016   K 3.2 (L) 11/24/2016   CL 103 11/24/2016   CREATININE 0.83 11/24/2016   BUN 7 11/24/2016   CO2 28 11/24/2016  TSH 9.41 (H) 11/24/2016   INR 1.10 06/12/2015   HGBA1C 10.3 09/07/2016   MICROALBUR 1.7 12/26/2014    Assessment & Plan:   Jaedah was seen today for hypertension, hypothyroidism, hyperlipidemia, diabetes and anemia.  Diagnoses and all orders for this visit:  Other specified hypothyroidism- her TSH is mildly elevated so I've asked her to increase her levothyroxine dose. -     TSH; Future -     levothyroxine (SYNTHROID, LEVOTHROID) 125 MCG tablet; Take 1 tablet (125 mcg total) by mouth daily.  Type 2 diabetes mellitus with right eye affected by proliferative retinopathy and traction retinal detachment involving macula, without long-term current use of insulin ([place])- she doesn't like using Victoza daily and wants to switch to a weekly GLP-1 agonist so I gave her samples of Bydureon. Will also start an  ARB for renal protection. -     Comprehensive metabolic panel; Future -     Exenatide ER (BYDUREON BCISE) 2 MG/0.85ML AUIJ; Inject 1 Act into the skin once a week. -     valsartan (DIOVAN) 320 MG tablet; Take 1 tablet (320 mg total) by mouth daily. -     Ambulatory referral to Ophthalmology  Vitamin D deficiency- her vitamin D level is low, I have asked her to start vitamin D replacement therapy. -     VITAMIN D 25 Hydroxy (Vit-D Deficiency, Fractures); Future -     Cholecalciferol 2000 units TABS; Take 1 tablet (2,000 Units total) by mouth daily.  Hyperlipidemia with target LDL less than 70- she has achieved her LDL goal is doing well on the statin. -     Lipid panel; Future  Essential hypertension, benign- her blood pressure is not adequately well controlled and she has a low potassium so I have asked her to start an ARB and and aldosterone antagonist -     Comprehensive metabolic panel; Future -     valsartan (DIOVAN) 320 MG tablet; Take 1 tablet (320 mg total) by mouth daily. -     Discontinue: chlorthalidone (HYGROTON) 25 MG tablet; Take 1 tablet (25 mg total) by mouth daily. -     spironolactone (ALDACTONE) 25 MG tablet; Take 1 tablet (25 mg total) by mouth daily.  Deficiency anemia- her anemia has resolved, will monitor for vitamin deficiency. -     CBC with Differential/Platelet; Future -     Vitamin B12; Future -     IBC panel; Future -     Folate; Future -     Ferritin; Future -     Vitamin B1; Future  Need for Tdap vaccination -     Tdap vaccine greater than or equal to 7yo IM  Intractable migraine without aura and with status migrainosus- will treat this with sumatriptan, I've asked her to continue taking amitriptyline. -     SUMAtriptan (IMITREX) 50 MG tablet; Take 1 tablet (50 mg total) by mouth every 2 (two) hours as needed for migraine. May repeat in 2 hours if headache persists or recurs.  Hypokalemia- will start spironolactone to treat this -     spironolactone  (ALDACTONE) 25 MG tablet; Take 1 tablet (25 mg total) by mouth daily.   I have discontinued Ms. Kelleher's liraglutide, levothyroxine, and chlorthalidone. I am also having her start on Exenatide ER, SUMAtriptan, valsartan, spironolactone, Cholecalciferol, and levothyroxine. Additionally, I am having her maintain her aspirin, ACCU-CHEK SOFTCLIX LANCETS, accu-chek softclix, INSULIN SYRINGE .5CC/28G, loratadine, albuterol, glucose blood, Insulin Glargine, ACCU-CHEK AVIVA PLUS, insulin aspart, omeprazole, atorvastatin,  escitalopram, amitriptyline, and lamoTRIgine.  Meds ordered this encounter  Medications  . Exenatide ER (BYDUREON BCISE) 2 MG/0.85ML AUIJ    Sig: Inject 1 Act into the skin once a week.    Dispense:  12 pen    Refill:  3  . SUMAtriptan (IMITREX) 50 MG tablet    Sig: Take 1 tablet (50 mg total) by mouth every 2 (two) hours as needed for migraine. May repeat in 2 hours if headache persists or recurs.    Dispense:  10 tablet    Refill:  2  . valsartan (DIOVAN) 320 MG tablet    Sig: Take 1 tablet (320 mg total) by mouth daily.    Dispense:  90 tablet    Refill:  1  . DISCONTD: chlorthalidone (HYGROTON) 25 MG tablet    Sig: Take 1 tablet (25 mg total) by mouth daily.    Dispense:  90 tablet    Refill:  1  . spironolactone (ALDACTONE) 25 MG tablet    Sig: Take 1 tablet (25 mg total) by mouth daily.    Dispense:  90 tablet    Refill:  1  . Cholecalciferol 2000 units TABS    Sig: Take 1 tablet (2,000 Units total) by mouth daily.    Dispense:  90 tablet    Refill:  3  . levothyroxine (SYNTHROID, LEVOTHROID) 125 MCG tablet    Sig: Take 1 tablet (125 mcg total) by mouth daily.    Dispense:  90 tablet    Refill:  1     Follow-up: Return in about 2 months (around 01/24/2017).  Scarlette Calico, MD

## 2016-11-24 NOTE — Progress Notes (Signed)
Pre visit review using our clinic review tool, if applicable. No additional management support is needed unless otherwise documented below in the visit note. 

## 2016-11-24 NOTE — Patient Instructions (Signed)

## 2016-11-25 ENCOUNTER — Encounter: Payer: Self-pay | Admitting: Internal Medicine

## 2016-11-25 DIAGNOSIS — E876 Hypokalemia: Secondary | ICD-10-CM | POA: Insufficient documentation

## 2016-11-25 MED ORDER — SPIRONOLACTONE 25 MG PO TABS: 25.0000 mg | ORAL_TABLET | Freq: Every day | ORAL | 1 refills | Status: DC

## 2016-11-25 MED ORDER — CHOLECALCIFEROL 50 MCG (2000 UT) PO TABS: 1.0000 | ORAL_TABLET | Freq: Every day | ORAL | 3 refills | Status: DC

## 2016-11-25 MED ORDER — LEVOTHYROXINE SODIUM 125 MCG PO TABS
125.0000 ug | ORAL_TABLET | Freq: Every day | ORAL | 1 refills | Status: DC
Start: 2016-11-25 — End: 2017-01-20

## 2016-11-28 LAB — VITAMIN B1: Vitamin B1 (Thiamine): 10 nmol/L (ref 8–30)

## 2016-12-04 ENCOUNTER — Encounter: Payer: Self-pay | Admitting: Internal Medicine

## 2016-12-06 ENCOUNTER — Other Ambulatory Visit: Payer: Self-pay | Admitting: Internal Medicine

## 2016-12-06 MED ORDER — FLUCONAZOLE 150 MG PO TABS: 150.0000 mg | ORAL_TABLET | Freq: Once | ORAL | 3 refills | Status: DC

## 2016-12-07 ENCOUNTER — Ambulatory Visit (INDEPENDENT_AMBULATORY_CARE_PROVIDER_SITE_OTHER): Payer: Medicare Other | Admitting: Psychiatry

## 2016-12-07 ENCOUNTER — Encounter (HOSPITAL_COMMUNITY): Payer: Self-pay | Admitting: Psychiatry

## 2016-12-07 DIAGNOSIS — Z79899 Other long term (current) drug therapy: Secondary | ICD-10-CM | POA: Diagnosis not present

## 2016-12-07 DIAGNOSIS — F419 Anxiety disorder, unspecified: Secondary | ICD-10-CM

## 2016-12-07 DIAGNOSIS — Z7982 Long term (current) use of aspirin: Secondary | ICD-10-CM

## 2016-12-07 DIAGNOSIS — Z87891 Personal history of nicotine dependence: Secondary | ICD-10-CM

## 2016-12-07 DIAGNOSIS — F331 Major depressive disorder, recurrent, moderate: Secondary | ICD-10-CM | POA: Diagnosis not present

## 2016-12-07 MED ORDER — ESCITALOPRAM OXALATE 20 MG PO TABS: 20.0000 mg | ORAL_TABLET | Freq: Every day | ORAL | 1 refills | Status: DC

## 2016-12-07 MED ORDER — LAMOTRIGINE 25 MG PO TABS: ORAL_TABLET | ORAL | 1 refills | Status: DC

## 2016-12-07 MED ORDER — AMITRIPTYLINE HCL 50 MG PO TABS: 75.0000 mg | ORAL_TABLET | Freq: Every day | ORAL | 1 refills | Status: DC

## 2016-12-07 NOTE — Progress Notes (Signed)
BH MD/PA/NP OP Progress Note  12/07/2016 4:31 PM Sonia Skinner  MRN:  [id number]  Chief Complaint:  Subjective:  I like the medication.  I have less irritability.  I'm sleeping better.  HPI: Sonia Skinner came for her follow-up appointment.  On her last visit we started her on Lamictal.  She is taking amitriptyline 75 mg at bedtime, Lexapro 20 mg and Lamictal 50 mg daily.  She has no rash or itching.  She started seeing primary care physician Dr. Ronnald Ramp and recently had blood work.  She has noticed her mood swing irritability, anger is much better.  She denies any hallucination or any paranoia.  She denies any aggressive behavior.  Now she is taking blood pressure medication and her blood pressure is much better than her previous visits.  She has no headaches.  Today she took amitriptyline in the morning because she was having pain in her hand and she appears somewhat sleepy.  I advised him not to take amitriptyline in the morning because of the sedation and she should take at bedtime.  Her appetite is okay.  Her energy level is improved.  She still have tingling and numbness in her left hand.  She has history of stroke.     Visit Diagnosis:    ICD-9-CM ICD-10-CM   1. Moderate episode of recurrent major depressive disorder (HCC) 296.32 F33.1 lamoTRIgine (LAMICTAL) 25 MG tablet     escitalopram (LEXAPRO) 20 MG tablet     amitriptyline (ELAVIL) 50 MG tablet    Past Psychiatric History: Reviewed. Patient remember taking antidepressant 20 years ago. She has one psychiatric admission at [facility] at that time after taking overdose on medication. She was seen at [facility] in 2017 2008 and given Strattera, Adderall, Zoloft, Celexa . She denies any history of mania, psychosis, hallucination or any self abusive behavior. She was never tested for ADD. In 2001 she moved to [facility] and she has seen psychiatrist there. She was given Wellbutrin to stop smoking for past few years. Patient denies any  history of physical, sexual, verbal learning emotional abuse.  Past Medical History:  Past Medical History:  Diagnosis Date  . Anxiety   . Carotid artery occlusion   . Complication of anesthesia    ALLERY TO ESTER BASE  . Depression    early 45s  . Diabetes mellitus    INSULIN DEPENDENT  . GERD (gastroesophageal reflux disease)   . Headache   . Hypertension   . Stroke [facility]) March 2015    left MCA infarct  . Thyroid disease     Past Surgical History:  Procedure Laterality Date  . ABDOMINAL HYSTERECTOMY    . ANTERIOR FIXATION AND POSTERIOR MICRODISCECTOMY CERVICAL SPINE  1999  . CAROTID ENDARTERECTOMY Left 11-04-13   cea  . ENDARTERECTOMY Left 11/04/2013    Family Psychiatric History: Reviewed.  Family History:  Family History  Problem Relation Age of Onset  . Diabetes Mother   . Heart disease Mother     Before age 43  . Cancer Father     Lung  . Hypertension Sister   . Diabetes Sister   . Diabetes Sister   . Diabetes Sister     Social History:  Social History   Social History  . Marital status: Legally Separated    Spouse name: N/A  . Number of children: 0  . Years of education: College   Occupational History  . [place] History Main Topics  .  Smoking status: Former Smoker    Packs/day: 0.20    Years: 20.00    Types: Cigarettes    Quit date: 11/01/2013  . Smokeless tobacco: Never Used  . Alcohol use No  . Drug use: No  . Sexual activity: Not Asked   Other Topics Concern  . None   Social History Narrative   Patient lives at home with sister.   Caffeine Use: 2 cups daily; sodas occasionally    Allergies:  Allergies  Allergen Reactions  . Anesthetics, Ester Anaphylaxis    Metabolic Disorder Labs: Lab Results  Component Value Date   HGBA1C 10.3 09/07/2016   MPG 255 (H) 11/02/2013   MPG 249 (H) 11/01/2013   No results found for: PROLACTIN Lab Results  Component Value Date   CHOL 173 11/24/2016   TRIG 192.0 (H)  11/24/2016   HDL 51.60 11/24/2016   CHOLHDL 3 11/24/2016   VLDL 38.4 11/24/2016   LDLCALC 83 11/24/2016   LDLCALC 105 (H) 09/18/2014     Current Medications: Current Outpatient Prescriptions  Medication Sig Dispense Refill  . ACCU-CHEK SOFTCLIX LANCETS lancets ACHS for E11.9 100 each 12  . albuterol (PROVENTIL HFA;VENTOLIN HFA) 108 (90 Base) MCG/ACT inhaler Inhale 1-2 puffs into the lungs every 6 (six) hours as needed for wheezing or shortness of breath.    Marland Kitchen amitriptyline (ELAVIL) 50 MG tablet Take 1.5 tablets (75 mg total) by mouth at bedtime. 45 tablet 1  . aspirin 325 MG tablet Take 1 tablet (325 mg total) by mouth daily.    Marland Kitchen atorvastatin (LIPITOR) 40 MG tablet Take 1 tablet (40 mg total) by mouth daily. 90 tablet 1  . Blood Glucose Monitoring Suppl (ACCU-CHEK AVIVA PLUS) w/Device KIT ACHS for E11.9 1 kit 0  . Cholecalciferol 2000 units TABS Take 1 tablet (2,000 Units total) by mouth daily. 90 tablet 3  . escitalopram (LEXAPRO) 20 MG tablet Take 1 tablet (20 mg total) by mouth daily. 30 tablet 1  . Exenatide ER (BYDUREON BCISE) 2 MG/0.85ML AUIJ Inject 1 Act into the skin once a week. 12 pen 3  . glucose blood (ACCU-CHEK AVIVA) test strip Check blood sugar AC and qHS for E11.9 100 each 12  . insulin aspart (NOVOLOG FLEXPEN) 100 UNIT/ML FlexPen 8 units with breakfast, 4 with lunch, and 30 units with supper, and pen needles 4/day. 15 mL 11  . Insulin Glargine (LANTUS SOLOSTAR) 100 UNIT/ML Solostar Pen Inject 35 Units into the skin at bedtime. 5 pen PRN  . INSULIN SYRINGE .5CC/28G 28G X 1/2" 0.5 ML MISC Give insulin once per night 100 each 12  . lamoTRIgine (LAMICTAL) 25 MG tablet Take 1 tab daily for 1 week and than 2 tab daily 60 tablet 1  . Lancet Devices (ACCU-CHEK SOFTCLIX) lancets Check blood sugar ACHS for E11.9 1 each 11  . levothyroxine (SYNTHROID, LEVOTHROID) 125 MCG tablet Take 1 tablet (125 mcg total) by mouth daily. 90 tablet 1  . loratadine (CLARITIN) 10 MG tablet TAKE ONE  TABLET BY MOUTH ONCE DAILY (Patient taking differently: TAKE ONE TABLET BY MOUTH ONCE DAILY AS NEEDED FOR ALLERGIES) 30 tablet 2  . omeprazole (PRILOSEC) 40 MG capsule TAKE ONE CAPSULE BY MOUTH ONCE DAILY 30 capsule 2  . spironolactone (ALDACTONE) 25 MG tablet Take 1 tablet (25 mg total) by mouth daily. 90 tablet 1  . SUMAtriptan (IMITREX) 50 MG tablet Take 1 tablet (50 mg total) by mouth every 2 (two) hours as needed for migraine. May repeat in 2 hours  if headache persists or recurs. 10 tablet 2  . valsartan (DIOVAN) 320 MG tablet Take 1 tablet (320 mg total) by mouth daily. 90 tablet 1   No current facility-administered medications for this visit.     Neurologic: Headache: No Seizure: No Paresthesias: Tingling in her hand  Musculoskeletal: Strength & Muscle Tone: decreased Gait & Station: normal Patient leans: N/A  Psychiatric Specialty Exam: ROS  Blood pressure 140/62, pulse (!) 114, height '5\' 2"'$  (1.575 m), weight 150 lb 9.6 oz (68.3 kg).Body mass index is 27.55 kg/m.  General Appearance: Casual  Eye Contact:  Good  Speech:  Slow  Volume:  Normal  Mood:  Anxious  Affect:  Congruent  Thought Process:  Coherent  Orientation:  Full (Time, Place, and Person)  Thought Content: WDL and Logical   Suicidal Thoughts:  No  Homicidal Thoughts:  No  Memory:  Immediate;   Good Recent;   Fair Remote;   Good  Judgement:  Good  Insight:  Good  Psychomotor Activity:  Decreased  Concentration:  Concentration: Good and Attention Span: Fair  Recall:  Good  Fund of Knowledge: Good  Language: Good  Akathisia:  No  Handed:  Right  AIMS (if indicated):  0  Assets:  Communication Skills Desire for Improvement Housing Resilience Social Support  ADL's:  Intact  Cognition: WNL  Sleep:  Improved    Assessment: Major depressive disorder, recurrent.  Anxiety disorder NOS.  Plan: Patient is doing better since we added Lamictal.  She is taking 50 mg daily and denies any rash, itching or  any shakes or tremors.  I will continue amitriptyline 75 mg at bedtime and Lexapro 20 mg daily.  Reminded not to take amitriptyline during the day due to sedation.  I review blood work results which was done recently at her primary care physician.  Her blood pressure is much better from the past.  Discussed medication side effects and benefits.  Recommended to call us back if she has any question, concern or if she feels worsening of the symptom.  Follow-up in 2 months.  Patient is not interested in counseling. Dimetri Armitage T., MD 12/07/2016, 4:31 PM

## 2016-12-11 ENCOUNTER — Emergency Department (HOSPITAL_COMMUNITY): Payer: Medicare Other

## 2016-12-11 ENCOUNTER — Inpatient Hospital Stay (HOSPITAL_COMMUNITY)
Admission: EM | Admit: 2016-12-11 | Discharge: 2016-12-14 | DRG: 683 | Disposition: A | Discharge status: Home / Self Care | Payer: Medicare Other | Attending: Internal Medicine | Admitting: Internal Medicine

## 2016-12-11 ENCOUNTER — Encounter (HOSPITAL_COMMUNITY): Payer: Self-pay

## 2016-12-11 ENCOUNTER — Inpatient Hospital Stay (HOSPITAL_COMMUNITY): Payer: Medicare Other

## 2016-12-11 DIAGNOSIS — N179 Acute kidney failure, unspecified: Secondary | ICD-10-CM | POA: Diagnosis not present

## 2016-12-11 DIAGNOSIS — Z7982 Long term (current) use of aspirin: Secondary | ICD-10-CM | POA: Diagnosis not present

## 2016-12-11 DIAGNOSIS — R55 Syncope and collapse: Secondary | ICD-10-CM | POA: Diagnosis present

## 2016-12-11 DIAGNOSIS — E039 Hypothyroidism, unspecified: Secondary | ICD-10-CM | POA: Diagnosis present

## 2016-12-11 DIAGNOSIS — Z79899 Other long term (current) drug therapy: Secondary | ICD-10-CM | POA: Diagnosis not present

## 2016-12-11 DIAGNOSIS — I9589 Other hypotension: Secondary | ICD-10-CM | POA: Diagnosis present

## 2016-12-11 DIAGNOSIS — E038 Other specified hypothyroidism: Secondary | ICD-10-CM | POA: Diagnosis not present

## 2016-12-11 DIAGNOSIS — E1165 Type 2 diabetes mellitus with hyperglycemia: Secondary | ICD-10-CM

## 2016-12-11 DIAGNOSIS — N1330 Unspecified hydronephrosis: Secondary | ICD-10-CM

## 2016-12-11 DIAGNOSIS — R740 Nonspecific elevation of levels of transaminase and lactic acid dehydrogenase [LDH]: Secondary | ICD-10-CM | POA: Diagnosis present

## 2016-12-11 DIAGNOSIS — R42 Dizziness and giddiness: Secondary | ICD-10-CM

## 2016-12-11 DIAGNOSIS — Z981 Arthrodesis status: Secondary | ICD-10-CM

## 2016-12-11 DIAGNOSIS — Z833 Family history of diabetes mellitus: Secondary | ICD-10-CM | POA: Diagnosis not present

## 2016-12-11 DIAGNOSIS — E118 Type 2 diabetes mellitus with unspecified complications: Secondary | ICD-10-CM

## 2016-12-11 DIAGNOSIS — K219 Gastro-esophageal reflux disease without esophagitis: Secondary | ICD-10-CM | POA: Diagnosis present

## 2016-12-11 DIAGNOSIS — Z87891 Personal history of nicotine dependence: Secondary | ICD-10-CM

## 2016-12-11 DIAGNOSIS — R404 Transient alteration of awareness: Secondary | ICD-10-CM | POA: Diagnosis not present

## 2016-12-11 DIAGNOSIS — R7989 Other specified abnormal findings of blood chemistry: Secondary | ICD-10-CM | POA: Diagnosis not present

## 2016-12-11 DIAGNOSIS — F329 Major depressive disorder, single episode, unspecified: Secondary | ICD-10-CM | POA: Diagnosis not present

## 2016-12-11 DIAGNOSIS — E860 Dehydration: Secondary | ICD-10-CM | POA: Diagnosis present

## 2016-12-11 DIAGNOSIS — E113521 Type 2 diabetes mellitus with proliferative diabetic retinopathy with traction retinal detachment involving the macula, right eye: Secondary | ICD-10-CM | POA: Diagnosis not present

## 2016-12-11 DIAGNOSIS — D72829 Elevated white blood cell count, unspecified: Secondary | ICD-10-CM | POA: Diagnosis not present

## 2016-12-11 DIAGNOSIS — Z8673 Personal history of transient ischemic attack (TIA), and cerebral infarction without residual deficits: Secondary | ICD-10-CM

## 2016-12-11 DIAGNOSIS — F32A Depression, unspecified: Secondary | ICD-10-CM | POA: Diagnosis present

## 2016-12-11 DIAGNOSIS — R6510 Systemic inflammatory response syndrome (SIRS) of non-infectious origin without acute organ dysfunction: Secondary | ICD-10-CM | POA: Diagnosis not present

## 2016-12-11 DIAGNOSIS — E861 Hypovolemia: Secondary | ICD-10-CM | POA: Diagnosis present

## 2016-12-11 DIAGNOSIS — Z794 Long term (current) use of insulin: Secondary | ICD-10-CM

## 2016-12-11 DIAGNOSIS — R9431 Abnormal electrocardiogram [ECG] [EKG]: Secondary | ICD-10-CM

## 2016-12-11 DIAGNOSIS — I10 Essential (primary) hypertension: Secondary | ICD-10-CM | POA: Diagnosis not present

## 2016-12-11 DIAGNOSIS — E119 Type 2 diabetes mellitus without complications: Secondary | ICD-10-CM

## 2016-12-11 DIAGNOSIS — Z884 Allergy status to anesthetic agent status: Secondary | ICD-10-CM | POA: Diagnosis not present

## 2016-12-11 DIAGNOSIS — A419 Sepsis, unspecified organism: Secondary | ICD-10-CM | POA: Diagnosis not present

## 2016-12-11 DIAGNOSIS — F419 Anxiety disorder, unspecified: Secondary | ICD-10-CM | POA: Diagnosis present

## 2016-12-11 DIAGNOSIS — R110 Nausea: Secondary | ICD-10-CM

## 2016-12-11 DIAGNOSIS — E871 Hypo-osmolality and hyponatremia: Secondary | ICD-10-CM | POA: Diagnosis not present

## 2016-12-11 LAB — CBC WITH DIFFERENTIAL/PLATELET
Basophils Absolute: 0 10*3/uL (ref 0.0–0.1)
Basophils Relative: 0 %
Eosinophils Absolute: 0.2 10*3/uL (ref 0.0–0.7)
Eosinophils Relative: 1 %
HCT: 44.6 % (ref 36.0–46.0)
Hemoglobin: 14.8 g/dL (ref 12.0–15.0)
Lymphocytes Relative: 20 %
Lymphs Abs: 3 10*3/uL (ref 0.7–4.0)
MCH: 28.7 pg (ref 26.0–34.0)
MCHC: 33.2 g/dL (ref 30.0–36.0)
MCV: 86.6 fL (ref 78.0–100.0)
Monocytes Absolute: 1.2 10*3/uL — ABNORMAL HIGH (ref 0.1–1.0)
Monocytes Relative: 8 %
Neutro Abs: 10.8 10*3/uL — ABNORMAL HIGH (ref 1.7–7.7)
Neutrophils Relative %: 71 %
Platelets: 175 10*3/uL (ref 150–400)
RBC: 5.15 MIL/uL — ABNORMAL HIGH (ref 3.87–5.11)
RDW: 12.8 % (ref 11.5–15.5)
WBC: 15.2 10*3/uL — ABNORMAL HIGH (ref 4.0–10.5)

## 2016-12-11 LAB — URINALYSIS, ROUTINE W REFLEX MICROSCOPIC
Bacteria, UA: NONE SEEN
Bilirubin Urine: NEGATIVE
Glucose, UA: >=500 mg/dL — AB
Hgb urine dipstick: NEGATIVE
Ketones, ur: NEGATIVE mg/dL
Nitrite: NEGATIVE
Protein, ur: NEGATIVE mg/dL
Specific Gravity, Urine: 1.025 (ref 1.005–1.030)
pH: 5 (ref 5.0–8.0)

## 2016-12-11 LAB — LIPASE, BLOOD: Lipase: 29 U/L (ref 11–51)

## 2016-12-11 LAB — COMPREHENSIVE METABOLIC PANEL
ALT: 16 U/L (ref 14–54)
AST: 16 U/L (ref 15–41)
Albumin: 4.4 g/dL (ref 3.5–5.0)
Alkaline Phosphatase: 141 U/L — ABNORMAL HIGH (ref 38–126)
Anion gap: 15 (ref 5–15)
BUN: 28 mg/dL — ABNORMAL HIGH (ref 6–20)
CO2: 24 mmol/L (ref 22–32)
Calcium: 9.8 mg/dL (ref 8.9–10.3)
Chloride: 94 mmol/L — ABNORMAL LOW (ref 101–111)
Creatinine, Ser: 2.09 mg/dL — ABNORMAL HIGH (ref 0.44–1.00)
GFR calc Af Amer: 27 mL/min — ABNORMAL LOW (ref >60)
GFR calc non Af Amer: 23 mL/min — ABNORMAL LOW (ref >60)
Glucose, Bld: 190 mg/dL — ABNORMAL HIGH (ref 65–99)
Potassium: 3.9 mmol/L (ref 3.5–5.1)
Sodium: 133 mmol/L — ABNORMAL LOW (ref 135–145)
Total Bilirubin: 0.6 mg/dL (ref 0.3–1.2)
Total Protein: 7.7 g/dL (ref 6.5–8.1)

## 2016-12-11 LAB — GLUCOSE, CAPILLARY: Glucose-Capillary: 129 mg/dL — ABNORMAL HIGH (ref 65–99)

## 2016-12-11 LAB — I-STAT TROPONIN, ED: Troponin i, poc: 0 ng/mL (ref 0.00–0.08)

## 2016-12-11 LAB — I-STAT CG4 LACTIC ACID, ED: Lactic Acid, Venous: 3.23 mmol/L (ref 0.5–1.9)

## 2016-12-11 LAB — CBG MONITORING, ED: Glucose-Capillary: 157 mg/dL — ABNORMAL HIGH (ref 65–99)

## 2016-12-11 LAB — LACTIC ACID, PLASMA: Lactic Acid, Venous: 1.7 mmol/L (ref 0.5–1.9)

## 2016-12-11 MED ORDER — LAMOTRIGINE 25 MG PO TABS
50.0000 mg | ORAL_TABLET | Freq: Every day | ORAL | Status: DC
  Administered 2016-12-11 – 2016-12-14 (×4): 50 mg via ORAL
  Filled 2016-12-11 (×4): qty 2

## 2016-12-11 MED ORDER — ATORVASTATIN CALCIUM 40 MG PO TABS
40.0000 mg | ORAL_TABLET | Freq: Every day | ORAL | Status: DC
  Administered 2016-12-11 – 2016-12-14 (×4): 40 mg via ORAL
  Filled 2016-12-11 (×4): qty 1

## 2016-12-11 MED ORDER — INSULIN ASPART 100 UNIT/ML ~~LOC~~ SOLN
0.0000 [IU] | Freq: Three times a day (TID) | SUBCUTANEOUS | Status: DC
  Administered 2016-12-12: 5 [IU] via SUBCUTANEOUS
  Administered 2016-12-12: 8 [IU] via SUBCUTANEOUS
  Administered 2016-12-12: 3 [IU] via SUBCUTANEOUS
  Administered 2016-12-13: 11 [IU] via SUBCUTANEOUS

## 2016-12-11 MED ORDER — HYDROCODONE-ACETAMINOPHEN 5-325 MG PO TABS
1.0000 | ORAL_TABLET | ORAL | Status: DC | PRN
  Administered 2016-12-11 – 2016-12-13 (×5): 1 via ORAL
  Administered 2016-12-13 – 2016-12-14 (×3): 2 via ORAL
  Filled 2016-12-11 (×4): qty 1
  Filled 2016-12-11: qty 2
  Filled 2016-12-11: qty 1
  Filled 2016-12-11 (×2): qty 2

## 2016-12-11 MED ORDER — PANTOPRAZOLE SODIUM 40 MG PO TBEC
40.0000 mg | DELAYED_RELEASE_TABLET | Freq: Every day | ORAL | Status: DC
  Administered 2016-12-11 – 2016-12-13 (×3): 40 mg via ORAL
  Filled 2016-12-11 (×3): qty 1

## 2016-12-11 MED ORDER — SODIUM CHLORIDE 0.9% FLUSH
3.0000 mL | Freq: Two times a day (BID) | INTRAVENOUS | Status: DC
  Administered 2016-12-11 – 2016-12-12 (×2): 3 mL via INTRAVENOUS

## 2016-12-11 MED ORDER — INSULIN GLARGINE 100 UNIT/ML ~~LOC~~ SOLN
35.0000 [IU] | Freq: Every day | SUBCUTANEOUS | Status: DC
  Administered 2016-12-11 – 2016-12-12 (×2): 35 [IU] via SUBCUTANEOUS
  Filled 2016-12-11 (×2): qty 0.35

## 2016-12-11 MED ORDER — SODIUM CHLORIDE 0.9 % IV SOLN
INTRAVENOUS | Status: AC
  Administered 2016-12-11 – 2016-12-12 (×2): via INTRAVENOUS

## 2016-12-11 MED ORDER — ONDANSETRON HCL 4 MG/2ML IJ SOLN: 4.0000 mg | Freq: Four times a day (QID) | INTRAMUSCULAR | Status: DC | PRN

## 2016-12-11 MED ORDER — INSULIN ASPART 100 UNIT/ML ~~LOC~~ SOLN
6.0000 [IU] | Freq: Three times a day (TID) | SUBCUTANEOUS | Status: DC
  Administered 2016-12-12 – 2016-12-14 (×7): 6 [IU] via SUBCUTANEOUS

## 2016-12-11 MED ORDER — SODIUM CHLORIDE 0.9 % IV SOLN: INTRAVENOUS | Status: DC

## 2016-12-11 MED ORDER — ACETAMINOPHEN 650 MG RE SUPP: 650.0000 mg | Freq: Four times a day (QID) | RECTAL | Status: DC | PRN

## 2016-12-11 MED ORDER — VITAMIN D 1000 UNITS PO TABS
2000.0000 [IU] | ORAL_TABLET | Freq: Every day | ORAL | Status: DC
  Administered 2016-12-11 – 2016-12-14 (×4): 2000 [IU] via ORAL
  Filled 2016-12-11 (×4): qty 2

## 2016-12-11 MED ORDER — ACETAMINOPHEN 325 MG PO TABS: 650.0000 mg | ORAL_TABLET | Freq: Four times a day (QID) | ORAL | Status: DC | PRN

## 2016-12-11 MED ORDER — ONDANSETRON HCL 4 MG/2ML IJ SOLN
4.0000 mg | Freq: Once | INTRAMUSCULAR | Status: DC
  Filled 2016-12-11: qty 2

## 2016-12-11 MED ORDER — PNEUMOCOCCAL VAC POLYVALENT 25 MCG/0.5ML IJ INJ
0.5000 mL | INJECTION | INTRAMUSCULAR | Status: DC
  Filled 2016-12-11: qty 0.5

## 2016-12-11 MED ORDER — ASPIRIN 325 MG PO TABS
325.0000 mg | ORAL_TABLET | Freq: Every day | ORAL | Status: DC
  Administered 2016-12-12 – 2016-12-14 (×3): 325 mg via ORAL
  Filled 2016-12-11 (×3): qty 1

## 2016-12-11 MED ORDER — HEPARIN SODIUM (PORCINE) 5000 UNIT/ML IJ SOLN
5000.0000 [IU] | Freq: Three times a day (TID) | INTRAMUSCULAR | Status: DC
  Administered 2016-12-11 – 2016-12-13 (×7): 5000 [IU] via SUBCUTANEOUS
  Filled 2016-12-11 (×7): qty 1

## 2016-12-11 MED ORDER — ALBUTEROL SULFATE (2.5 MG/3ML) 0.083% IN NEBU: 3.0000 mL | INHALATION_SOLUTION | Freq: Four times a day (QID) | RESPIRATORY_TRACT | Status: DC | PRN

## 2016-12-11 MED ORDER — ESCITALOPRAM OXALATE 20 MG PO TABS
20.0000 mg | ORAL_TABLET | Freq: Every day | ORAL | Status: DC
  Administered 2016-12-12 – 2016-12-14 (×3): 20 mg via ORAL
  Filled 2016-12-11 (×3): qty 1

## 2016-12-11 MED ORDER — ONDANSETRON HCL 4 MG PO TABS: 4.0000 mg | ORAL_TABLET | Freq: Four times a day (QID) | ORAL | Status: DC | PRN

## 2016-12-11 MED ORDER — SODIUM CHLORIDE 0.9 % IV BOLUS (SEPSIS): 1000.0000 mL | Freq: Once | INTRAVENOUS | Status: DC

## 2016-12-11 MED ORDER — INSULIN ASPART 100 UNIT/ML ~~LOC~~ SOLN
0.0000 [IU] | Freq: Every day | SUBCUTANEOUS | Status: DC
  Administered 2016-12-13: 2 [IU] via SUBCUTANEOUS

## 2016-12-11 MED ORDER — LEVOTHYROXINE SODIUM 125 MCG PO TABS
125.0000 ug | ORAL_TABLET | Freq: Every day | ORAL | Status: DC
  Administered 2016-12-12 – 2016-12-14 (×3): 125 ug via ORAL
  Filled 2016-12-11 (×3): qty 1

## 2016-12-11 MED ORDER — SODIUM CHLORIDE 0.9 % IV BOLUS (SEPSIS)
1000.0000 mL | Freq: Once | INTRAVENOUS | Status: AC
  Administered 2016-12-11: 1000 mL via INTRAVENOUS

## 2016-12-11 MED ORDER — AMITRIPTYLINE HCL 50 MG PO TABS
75.0000 mg | ORAL_TABLET | Freq: Every day | ORAL | Status: DC
  Administered 2016-12-11 – 2016-12-13 (×3): 75 mg via ORAL
  Filled 2016-12-11 (×3): qty 2

## 2016-12-11 NOTE — ED Notes (Signed)
Dr Eulis Foster given a copy of lactic acid results 3.23

## 2016-12-11 NOTE — ED Provider Notes (Signed)
[facility] DEPT Provider Note   CSN: [id number] Arrival date & time: 12/11/16  1633     History   Chief Complaint Chief Complaint  Patient presents with  . Loss of Consciousness    HPI Sonia Skinner is a 66 y.o. female with a PMHx of HTN, migraines, DM2, carotid artery occlusion s/p endarterectomy, depression, anxiety, hypothyroidism, remote L MCA CVA (2015), anemia, and GERD, as well as other medical conditions listed below, who presents to the ED via EMS with complaints of syncopal event just prior to arrival. Patient's family assists with history. Per patient and family, patient stood up after getting done with the hair dryer at the beauty shop, felt lightheaded, and subsequently had a brief episode of LOC for less than 1 minute. Patient doesn't think that she hit her head, initially the reports were that she did not hit her head and that she was caught by bystanders, although later the niece reported that she may have bumped her head on the floor when she was helped down during the syncopal event. Patient's niece states that she came to fairly quickly, and initially it was difficult to understand what she was saying to them but they deny any seizure-like activity or postictal confusion. No specific slurred speech reported. She reports associated nausea as well. Per EMS, she was orthostatic when they arrived, BP 92/58 laying and 61/34 standing, she was given 300 mL bolus of fluids and patient states that this has helped with her lightheadedness. She reports that the lightheadedness worsens when she stands, and improved with rest and laying down as well as the fluid bolus. She has not been given anything else prior to arrival. She admits that she has not eaten anything today, and only had a few sips of ginger ale all day. She didn't take her blood pressure medications today, and these were recently changed on 11/24/16 by her PCP, and she states that since they changed her medicines her blood  pressure has been fairly low. Chart review reveals she was seen by her PCP Dr. Ronnald Ramp at [facility] on 11/24/16 and they stopped her chlorthalidone and started spironolactone '25mg'$  QD and valsartan '320mg'$  QD due to issues with hypokalemia and insufficient BP control on Chlorthalidone. She is not on any blood thinners. She recently finished a course of antibiotics (she thinks it was cipro/flagyl) for diverticulitis; finished these on Wednesday 12/08/16 (3 days ago).  She and her family deny any fevers, chills, vision changes, vertigo, HA, seizure-like activity, incontinence of urine/stool, tongue biting/trauma, palpitations, diaphoresis, CP, SOB, abd pain, V/D/C, melena, hematochezia, hematuria, dysuria, myalgias, arthralgias, numbness, tingling, focal weakness, or any other complaints at this time. Pt does not think she hit her head, denies any pain in her head or HA.    The history is provided by the patient, medical records, the EMS personnel and a relative. No language interpreter was used.  Loss of Consciousness   This is a new problem. The current episode started less than 1 hour ago. The problem occurs rarely. The problem has been resolved. She lost consciousness for a period of less than one minute. The problem is associated with standing up. Associated symptoms include light-headedness and nausea. Pertinent negatives include abdominal pain, bladder incontinence, bowel incontinence, chest pain, confusion, diaphoresis, dizziness, fever, focal sensory loss, focal weakness, headaches, palpitations, seizures, vertigo, visual change, vomiting and weakness. She has tried relaxation (and 330m bolus) for the symptoms. The treatment provided moderate relief. Her past medical history is significant  for CVA, DM and HTN.    Past Medical History:  Diagnosis Date  . Anxiety   . Carotid artery occlusion   . Complication of anesthesia    ALLERY TO ESTER BASE  . Depression    early 24s  . Diabetes  mellitus    INSULIN DEPENDENT  . GERD (gastroesophageal reflux disease)   . Headache   . Hypertension   . Stroke [facility]) March 2015    left MCA infarct  . Thyroid disease     Patient Active Problem List   Diagnosis Date Noted  . Hypokalemia 11/25/2016  . Deficiency anemia 11/24/2016  . Intractable migraine without aura and with status migrainosus 11/24/2016  . Left thyroid nodule 05/05/2016  . GERD (gastroesophageal reflux disease) 03/08/2016  . Hx of medication noncompliance   . Major depressive disorder, recurrent episode ([place]) 02/20/2016  . Keloid scar-Left neck post-op site 12/19/2013  . CVA (cerebral infarction) 11/25/2013  . Occlusion and stenosis of carotid artery with cerebral infarction 11/25/2013  . Depression 11/25/2013  . Follow-up examination, following unspecified surgery 11/21/2013  . Carotid stenosis 11/03/2013  . Acute ischemic left MCA stroke ([place]) 11/02/2013  . CVA (cerebral vascular accident) ([place]) 11/01/2013  . Vitamin D deficiency 10/19/2013  . Tobacco abuse 07/31/2013  . Essential hypertension, benign 04/20/2013  . DM type 2 (diabetes mellitus, type 2) ([place]) 04/20/2013  . Hypothyroidism 04/20/2013  . Hyperlipidemia with target LDL less than 70 04/20/2013    Past Surgical History:  Procedure Laterality Date  . ABDOMINAL HYSTERECTOMY    . ANTERIOR FIXATION AND POSTERIOR MICRODISCECTOMY CERVICAL SPINE  1999  . CAROTID ENDARTERECTOMY Left 11-04-13   cea  . ENDARTERECTOMY Left 11/04/2013    OB History    No data available       Home Medications    Prior to Admission medications   Medication Sig Start Date End Date Taking? Authorizing Provider  ACCU-CHEK SOFTCLIX LANCETS lancets ACHS for E11.9 03/25/15   Lance Bosch, NP  albuterol (PROVENTIL HFA;VENTOLIN HFA) 108 (90 Base) MCG/ACT inhaler Inhale 1-2 puffs into the lungs every 6 (six) hours as needed for wheezing or shortness of breath.    Historical Provider, MD  amitriptyline (ELAVIL) 50 MG  tablet Take 1.5 tablets (75 mg total) by mouth at bedtime. 12/07/16   Kathlee Nations, MD  aspirin 325 MG tablet Take 1 tablet (325 mg total) by mouth daily. 11/06/13   Samella Parr, NP  atorvastatin (LIPITOR) 40 MG tablet Take 1 tablet (40 mg total) by mouth daily. 10/15/16   Renato Shin, MD  Blood Glucose Monitoring Suppl (ACCU-CHEK AVIVA PLUS) w/Device KIT ACHS for E11.9 08/11/16   Tresa Garter, MD  Cholecalciferol 2000 units TABS Take 1 tablet (2,000 Units total) by mouth daily. 11/25/16   [name], MD  escitalopram (LEXAPRO) 20 MG tablet Take 1 tablet (20 mg total) by mouth daily. 12/07/16 12/07/17  Kathlee Nations, MD  Exenatide ER (BYDUREON BCISE) 2 MG/0.85ML AUIJ Inject 1 Act into the skin once a week. 11/24/16   [name], MD  glucose blood (ACCU-CHEK AVIVA) test strip Check blood sugar AC and qHS for E11.9 06/18/16   Tresa Garter, MD  insulin aspart (NOVOLOG FLEXPEN) 100 UNIT/ML FlexPen 8 units with breakfast, 4 with lunch, and 30 units with supper, and pen needles 4/day. 09/07/16   Renato Shin, MD  Insulin Glargine (LANTUS SOLOSTAR) 100 UNIT/ML Solostar Pen Inject 35 Units into the skin at bedtime. 07/12/16  Renato Shin, MD  INSULIN SYRINGE .5CC/28G 28G X 1/2" 0.5 ML MISC Give insulin once per night 08/14/15   Lance Bosch, NP  lamoTRIgine (LAMICTAL) 25 MG tablet Take 1 tab daily for 1 week and than 2 tab daily 12/07/16   Kathlee Nations, MD  Lancet Devices [facility]) lancets Check blood sugar ACHS for E11.9 08/14/15   Lance Bosch, NP  levothyroxine (SYNTHROID, LEVOTHROID) 125 MCG tablet Take 1 tablet (125 mcg total) by mouth daily. 11/25/16   [name], MD  loratadine (CLARITIN) 10 MG tablet TAKE ONE TABLET BY MOUTH ONCE DAILY Patient taking differently: TAKE ONE TABLET BY MOUTH ONCE DAILY AS NEEDED FOR ALLERGIES 01/19/16   Tresa Garter, MD  omeprazole (PRILOSEC) 40 MG capsule TAKE ONE CAPSULE BY MOUTH ONCE DAILY 10/04/16   Renato Shin, MD    spironolactone (ALDACTONE) 25 MG tablet Take 1 tablet (25 mg total) by mouth daily. 11/25/16   [name], MD  SUMAtriptan (IMITREX) 50 MG tablet Take 1 tablet (50 mg total) by mouth every 2 (two) hours as needed for migraine. May repeat in 2 hours if headache persists or recurs. 11/24/16   [name], MD  valsartan (DIOVAN) 320 MG tablet Take 1 tablet (320 mg total) by mouth daily. 11/24/16   [name], MD    Family History Family History  Problem Relation Age of Onset  . Diabetes Mother   . Heart disease Mother     Before age 17  . Cancer Father     Lung  . Hypertension Sister   . Diabetes Sister   . Diabetes Sister   . Diabetes Sister     Social History Social History  Substance Use Topics  . Smoking status: Former Smoker    Packs/day: 0.20    Years: 20.00    Types: Cigarettes    Quit date: 11/01/2013  . Smokeless tobacco: Never Used  . Alcohol use No     Allergies   Anesthetics, ester   Review of Systems Review of Systems  Constitutional: Negative for chills, diaphoresis and fever.  HENT:       No tongue biting  Eyes: Negative for visual disturbance.  Respiratory: Negative for shortness of breath.   Cardiovascular: Positive for syncope. Negative for chest pain and palpitations.  Gastrointestinal: Positive for nausea. Negative for abdominal pain, blood in stool, bowel incontinence, constipation, diarrhea and vomiting.  Genitourinary: Negative for bladder incontinence, difficulty urinating (no incontinence), dysuria and hematuria.  Musculoskeletal: Negative for arthralgias and myalgias.  Skin: Negative for color change.  Allergic/Immunologic: Positive for immunocompromised state (DM2).  Neurological: Positive for syncope and light-headedness. Negative for dizziness, vertigo, focal weakness, seizures, weakness, numbness and headaches.  Hematological: Does not bruise/bleed easily.  Psychiatric/Behavioral: Negative for confusion.   All other systems  reviewed and are negative for acute change except as noted in the HPI.    Physical Exam Updated Vital Signs BP 110/63   Pulse 95   Temp 97.6 F (36.4 C) (Oral)   Resp 16   SpO2 100%   Physical Exam  Constitutional: She is oriented to person, place, and time. Vital signs are normal. She appears well-developed and well-nourished.  Non-toxic appearance. No distress.  Afebrile, nontoxic, NAD, BP slightly soft 110/63 while laying down  HENT:  Head: Normocephalic and atraumatic. Head is without raccoon's eyes, without Battle's sign, without abrasion and without contusion.  Mouth/Throat: Oropharynx is clear and moist. Mucous membranes are dry.  [place]/AT,  no abrasions/contusions noted, no raccoon eyes or battle's sign, no scalp crepitus/deformity or tenderness Lips mildly dry  Eyes: Conjunctivae and EOM are normal. Pupils are equal, round, and reactive to light. Right eye exhibits no discharge. Left eye exhibits no discharge.  PERRL, EOMI, no nystagmus, no visual field deficits   Neck: Normal range of motion. Neck supple. No spinous process tenderness and no muscular tenderness present. No neck rigidity. Normal range of motion present.  FROM intact without spinous process TTP, no bony stepoffs or deformities, no paraspinous muscle TTP or muscle spasms. No rigidity or meningeal signs. No bruising or swelling.   Cardiovascular: Normal rate, regular rhythm, normal heart sounds and intact distal pulses.  Exam reveals no gallop and no friction rub.   No murmur heard. While at rest, RRR, nl s1/s2, no m/r/g, but with sitting pt's HR increases slightly from 80-90s to low 100s; distal pulses intact, no pedal edema   Pulmonary/Chest: Effort normal and breath sounds normal. No respiratory distress. She has no decreased breath sounds. She has no wheezes. She has no rhonchi. She has no rales.  Abdominal: Soft. Normal appearance and bowel sounds are normal. She exhibits no distension. There is tenderness in the  left lower quadrant. There is no rigidity, no rebound, no guarding, no CVA tenderness, no tenderness at McBurney's point and negative Murphy's sign.  Soft, nondistended, +BS throughout, with very mild LLQ discomfort on palpation although no significant tenderness appreciated or reported during palpation, no r/g/r, neg murphy's, neg mcburney's, no CVA TTP   Musculoskeletal: Normal range of motion.  MAE x4 Strength and sensation grossly intact in all extremities Distal pulses intact Gait not initially assessed due to orthostatic lightheadedness complaint  Neurological: She is alert and oriented to person, place, and time. She has normal strength. No cranial nerve deficit or sensory deficit. Coordination normal. GCS eye subscore is 4. GCS verbal subscore is 5. GCS motor subscore is 6.  CN 2-12 grossly intact A&O x4 GCS 15 Sensation and strength intact Gait not initially assessed Coordination with finger-to-nose WNL Neg pronator drift   Skin: Skin is warm, dry and intact. No rash noted.  Psychiatric: She has a normal mood and affect.  Nursing note and vitals reviewed.    ED Treatments / Results  Labs (all labs ordered are listed, but only abnormal results are displayed) Labs Reviewed  CBC WITH DIFFERENTIAL/PLATELET - Abnormal; Notable for the following:       Result Value   WBC 15.2 (*)    RBC 5.15 (*)    Neutro Abs 10.8 (*)    Monocytes Absolute 1.2 (*)    All other components within normal limits  COMPREHENSIVE METABOLIC PANEL - Abnormal; Notable for the following:    Sodium 133 (*)    Chloride 94 (*)    Glucose, Bld 190 (*)    BUN 28 (*)    Creatinine, Ser 2.09 (*)    Alkaline Phosphatase 141 (*)    GFR calc non Af Amer 23 (*)    GFR calc Af Amer 27 (*)    All other components within normal limits  I-STAT CG4 LACTIC ACID, ED - Abnormal; Notable for the following:    Lactic Acid, Venous 3.23 (*)    All other components within normal limits  LIPASE, BLOOD  URINALYSIS,  ROUTINE W REFLEX MICROSCOPIC  I-STAT TROPOININ, ED  CBG MONITORING, ED    EKG  EKG Interpretation  Date/Time:  Saturday December 11 2016 16:53:32 EDT Ventricular Rate:  94 PR Interval:    QRS Duration: 87 QT Interval:  350 QTC Calculation: 438 R Axis:   53 Text Interpretation:  Sinus rhythm Probable left atrial enlargement Probable LVH with secondary repol abnrm Since last tracing LVH with repolarization abnormality is new Confirmed by Eulis Foster  MD, ELLIOTT [phone]) on 12/11/2016 5:09:57 PM       Radiology Ct Head Wo Contrast  Result Date: 12/11/2016 CLINICAL DATA:  Passed out, dizziness and nausea. EXAM: CT HEAD WITHOUT CONTRAST TECHNIQUE: Contiguous axial images were obtained from the base of the skull through the vertex without intravenous contrast. COMPARISON:  06/12/2015 MRI and CT FINDINGS: Brain: Small focus of encephalomalacia along the posterior left parietal lobe consistent with remote left MCA distribution infarct. No acute intracranial hemorrhage, midline shift or edema. No large vascular territory infarction. No intra-axial mass nor extra-axial fluid. No ventriculomegaly. No effacement of the basal cisterns. Fourth ventricle is midline. No acute cervical abnormality. Vascular: No hyperdense vessel or unexpected calcification. Skull: Normal. Negative for fracture or focal lesion. Sinuses/Orbits: No acute finding. Small mucous retention cyst in the sphenoid. Other: None. IMPRESSION: 1. Small focus of encephalomalacia in the high left posterior parietal lobe from remote MCA distribution infarct. 2. No acute intracranial abnormality. Electronically Signed   By: Ashley Royalty M.D.   On: 12/11/2016 18:05    Procedures Procedures (including critical care time)  Medications Ordered in ED Medications  ondansetron (ZOFRAN) injection 4 mg (4 mg Intravenous Refused 12/11/16 1853)  sodium chloride 0.9 % bolus 1,000 mL (1,000 mLs Intravenous New Bag/Given 12/11/16 1843)    And  0.9 %  sodium  chloride infusion (not administered)  sodium chloride 0.9 % bolus 1,000 mL (not administered)     Initial Impression / Assessment and Plan / ED Course  I have reviewed the triage vital signs and the nursing notes.  Pertinent labs & imaging results that were available during my care of the patient were reviewed by me and considered in my medical decision making (see chart for details).     66 y.o. female here with syncopal event PTA. Hasn't eaten anything all day, was at the beauty parlor, had just gotten hair dried, stood up and felt lightheaded, then had witnessed syncope event, LOC<1 min. Possible head inj on floor, although reported initially as no head inj. Pt reports nausea. States lightheadedness resolved after 380m bolus and rest, worsens with standing. +Orthostatics on EMS arrival. Recently changed BP meds, didn't take these today, but has noticed BP running low since changing meds. On exam, BP slightly soft 110/63, no tachycardia, no hypoxia, no fever, very mild LLQ discomfort on palpation but denies pain and no significant tenderness appreciated, nonperitoneal exam. Lips dry. No focal neuro deficits on exam, however I did not ambulate pt due to orthostatic lightheadedness complaint. EKG with LVH and T wave flattening/inversion in lateral leads and V4-5 which is new from prior EKG. Seems like vasovagal syncope from poor oral intake today and recent BP med alterations, but given ?head inj and hx of CVA, will obtain CT head; will get labs including lactic. Will hold off on any abdominal imaging since no significant tenderness appreciated. Will give zofran and fluids then reassess shortly. Discussed case with my attending Dr. WEulis Fosterwho agrees with plan.   6:52 PM Pt states she's feeling a little better, fluids and meds just now started. CBC w/diff showing mild leukocytosis 15.2 but differential fairly unremarkable (neut% WNL, absolute neutrophils slightly up), and appears slightly  hemoconcentrated; could  be related to dehydration/hemoconcentration vs stress demargination. CMP showing gluc 190, no anion gap, bicarb WNL; sodium and chloride slightly low; Cr 2.09 which is much higher than baseline (0.8-1.0 baseline) and BUN 28; alk phos 141 which is marginally elevated, as previously noted on prior values as well. Lipase WNL. Trop negative. Lactic 3.23 which could be from stress response vs dehydration, fluids running, added another 1L bolus to make it a total of 2L which would satisfy the 57m/kg. Doubt sepsis, will hold off on any empiric abx since lactic level and leukocytosis most likely from dehydration/hemoconcentration/stress demargination. CT head negative for acute findings, small encephalomalacia in high L posterior parietal lobe from remote MCA infarct. U/A pending. Will proceed with admission for AKI/syncope.  7:09 PM [facility] and will admit. U/A still not yet done. Holding orders to be placed by admitting team. Please see their notes for further documentation of care. I appreciate their help with this pleasant pt's care. Pt stable at time of admission.    Final Clinical Impressions(s) / ED Diagnoses   Final diagnoses:  Syncope and collapse  AKI (acute kidney injury) (HDowns  Elevated lactic acid level  Type 2 diabetes mellitus with hyperglycemia, with long-term current use of insulin (HCC)  Leukocytosis, unspecified type  Orthostatic lightheadedness  Nonspecific abnormal electrocardiogram (ECG) (EKG)  Nausea    New Prescriptions New Prescriptions   No medications on file     [id number] [place] Vina Byrd PA-C 12/11/16 1[place] MD 12/12/16 1225

## 2016-12-11 NOTE — ED Triage Notes (Signed)
To room via EMS.  Pt reports has not felt good all day.  Pt went to stand up and bystanders report pt "passed out".  No injuries noted, as pt was caught by bystanders.  Pt reports BP medication change last week, did not take BP med today.  c/o dizziness, nauseous.  Denies chest pain or shortness of breath.

## 2016-12-11 NOTE — ED Provider Notes (Signed)
  Face-to-face evaluation   History: Patient with decreased oral intake, and syncope, today.  Recently started on valsartan.  No injury today after syncopal episode.  Physical exam: Alert elderly female who is lucid and cooperative.  There is no dysarthria and aphasia or nystagmus.  Medical screening examination/treatment/procedure(s) were conducted as a shared visit with non-physician practitioner(s) and myself.  I personally evaluated the patient during the encounter   Daleen Bo, MD 12/12/16 1226

## 2016-12-11 NOTE — H&P (Signed)
History and Physical    Sonia Skinner TOI:[id number] DOB: [birth year] DOA: 12/11/2016  PCP: Scarlette Calico, MD   Patient coming from: Home  Chief Complaint: Syncope, malaise   HPI: Sonia Skinner is a 66 y.o. female with medical history significant for hypertension, insulin-dependent diabetes mellitus, depression, carotid artery occlusion with CVA status post CEA, and hypothyroidism who presents to the emergency department following a syncopal event. She had [place] at the [place], and was standing up from a chair when she was noted by bystanders to become wobbly and start to fall. She was reportedly caught by another patron and eased to the floor. Patient reports that she has been experiencing mild nausea and a nonspecific malaise for the past 4 or 5 days, worsened yesterday after spending the day at an outdoor event. There has been a poor appetite associated with this, but no abdominal pain, vomiting, or diarrhea. Patient denies any chest pain or palpitations and denies any headache, change in vision or hearing, or focal numbness or weakness. She had seen her PCP on 11/24/2016 with poorly controlled blood pressure and hypokalemia, and had her antihypertensives adjusted at that visit. She stopped chlorthalidone on 11/24/2016 and started Aldactone and valsartan. She denies any recent use of alcohol or illicit substances. EMS reports that the patient was very orthostatic upon their arrival and 300 mL normal saline was administered en route.  ED Course: Upon arrival to the ED, patient is found to be afebrile, saturating well on room air, with soft blood pressures, but vitals otherwise stable. EKG features a sinus rhythm with LVH by voltage criteria and secondary repolarization abnormality. Chest x-ray is negative for acute cardiopulmonary disease. CBC is notable for a sodium of 133 and serum creatinine to 2.09, up from 0.83 earlier this month. CBC is notable for a leukocytosis to 15,200. Lactic acid is elevated  to 3.23 and troponin is undetectable. Urinalysis is not suggestive of infection, but notable for glucosuria. Patient was given 2 L normal saline and a dose of Zofran in the emergency department. She remained hemodynamically stable and in no apparent respiratory distress and will be admitted to the telemetry unit for ongoing evaluation and management of a syncopal episode with soft blood pressure and acute kidney injury, likely secondary to dehydration with orthostasis.   Review of Systems:  All other systems reviewed and apart from HPI, are negative.  Past Medical History:  Diagnosis Date  . Anxiety   . Carotid artery occlusion   . Complication of anesthesia    ALLERY TO ESTER BASE  . Depression    early 41s  . Diabetes mellitus    INSULIN DEPENDENT  . GERD (gastroesophageal reflux disease)   . Headache   . Hypertension   . Stroke [facility]) March 2015    left MCA infarct  . Thyroid disease     Past Surgical History:  Procedure Laterality Date  . ABDOMINAL HYSTERECTOMY    . ANTERIOR FIXATION AND POSTERIOR MICRODISCECTOMY CERVICAL SPINE  1999  . CAROTID ENDARTERECTOMY Left 11-04-13   cea  . ENDARTERECTOMY Left 11/04/2013     reports that she quit smoking about 3 years ago. Her smoking use included Cigarettes. She has a 4.00 pack-year smoking history. She has never used smokeless tobacco. She reports that she does not drink alcohol or use drugs.  Allergies  Allergen Reactions  . Anesthetics, Ester Anaphylaxis    Family History  Problem Relation Age of Onset  . Diabetes Mother   . Heart disease  Mother     Before age 25  . Cancer Father     Lung  . Hypertension Sister   . Diabetes Sister   . Diabetes Sister   . Diabetes Sister      Prior to Admission medications   Medication Sig Start Date End Date Taking? Authorizing Provider  ACCU-CHEK SOFTCLIX LANCETS lancets ACHS for E11.9 03/25/15   Lance Bosch, NP  albuterol (PROVENTIL HFA;VENTOLIN HFA) 108 (90 Base) MCG/ACT  inhaler Inhale 1-2 puffs into the lungs every 6 (six) hours as needed for wheezing or shortness of breath.    Historical Provider, MD  amitriptyline (ELAVIL) 50 MG tablet Take 1.5 tablets (75 mg total) by mouth at bedtime. 12/07/16   Kathlee Nations, MD  aspirin 325 MG tablet Take 1 tablet (325 mg total) by mouth daily. 11/06/13   Samella Parr, NP  atorvastatin (LIPITOR) 40 MG tablet Take 1 tablet (40 mg total) by mouth daily. 10/15/16   Renato Shin, MD  Blood Glucose Monitoring Suppl (ACCU-CHEK AVIVA PLUS) w/Device KIT ACHS for E11.9 08/11/16   Tresa Garter, MD  Cholecalciferol 2000 units TABS Take 1 tablet (2,000 Units total) by mouth daily. 11/25/16   [name], MD  escitalopram (LEXAPRO) 20 MG tablet Take 1 tablet (20 mg total) by mouth daily. 12/07/16 12/07/17  Kathlee Nations, MD  Exenatide ER (BYDUREON BCISE) 2 MG/0.85ML AUIJ Inject 1 Act into the skin once a week. 11/24/16   [name], MD  glucose blood (ACCU-CHEK AVIVA) test strip Check blood sugar AC and qHS for E11.9 06/18/16   Tresa Garter, MD  insulin aspart (NOVOLOG FLEXPEN) 100 UNIT/ML FlexPen 8 units with breakfast, 4 with lunch, and 30 units with supper, and pen needles 4/day. 09/07/16   Renato Shin, MD  Insulin Glargine (LANTUS SOLOSTAR) 100 UNIT/ML Solostar Pen Inject 35 Units into the skin at bedtime. 07/12/16   Renato Shin, MD  INSULIN SYRINGE .5CC/28G 28G X 1/2" 0.5 ML MISC Give insulin once per night 08/14/15   Lance Bosch, NP  lamoTRIgine (LAMICTAL) 25 MG tablet Take 1 tab daily for 1 week and than 2 tab daily 12/07/16   Kathlee Nations, MD  Lancet Devices [facility]) lancets Check blood sugar ACHS for E11.9 08/14/15   Lance Bosch, NP  levothyroxine (SYNTHROID, LEVOTHROID) 125 MCG tablet Take 1 tablet (125 mcg total) by mouth daily. 11/25/16   [name], MD  loratadine (CLARITIN) 10 MG tablet TAKE ONE TABLET BY MOUTH ONCE DAILY Patient taking differently: TAKE ONE TABLET BY MOUTH ONCE DAILY AS  NEEDED FOR ALLERGIES 01/19/16   Tresa Garter, MD  omeprazole (PRILOSEC) 40 MG capsule TAKE ONE CAPSULE BY MOUTH ONCE DAILY 10/04/16   Renato Shin, MD  spironolactone (ALDACTONE) 25 MG tablet Take 1 tablet (25 mg total) by mouth daily. 11/25/16   [name], MD  SUMAtriptan (IMITREX) 50 MG tablet Take 1 tablet (50 mg total) by mouth every 2 (two) hours as needed for migraine. May repeat in 2 hours if headache persists or recurs. 11/24/16   [name], MD  valsartan (DIOVAN) 320 MG tablet Take 1 tablet (320 mg total) by mouth daily. 11/24/16   [name], MD    Physical Exam: Vitals:   12/11/16 1655 12/11/16 1840  BP: 110/63 (!) 104/46  Pulse: 95 95  Resp: 16   Temp: 97.6 F (36.4 C)   TempSrc: Oral   SpO2: 100% 99%  Constitutional: No respiratory distress, calm; appears fatigued Eyes: PERTLA, lids and conjunctivae normal ENMT: Mucous membranes are moist. Posterior pharynx clear of any exudate or lesions.   Neck: normal, supple, no masses, no thyromegaly Respiratory: clear to auscultation bilaterally, no wheezing, no crackles. Normal respiratory effort.   Cardiovascular: S1 & S2 heard, regular rate and rhythm. No extremity edema.  No significant JVD. Abdomen: No distension, soft, no tenderness, no masses palpated. Bowel sounds appreciated.  Musculoskeletal: no clubbing / cyanosis. No joint deformity upper and lower extremities.   Skin: no significant rashes, lesions, ulcers. Warm, dry, well-perfused. Poor turgor.  Neurologic: CN 2-12 grossly intact. Sensation intact, DTR normal. Strength 5/5 in all 4 limbs.  Psychiatric: Alert and oriented x 3. Pleasant and cooperative.     Labs on Admission: I have personally reviewed following labs and imaging studies  CBC:  Recent Labs Lab 12/11/16 1722  WBC 15.2*  NEUTROABS 10.8*  HGB 14.8  HCT 44.6  MCV 86.6  PLT 409   Basic Metabolic Panel:  Recent Labs Lab 12/11/16 1722  NA 133*  K 3.9  CL 94*  CO2 24    GLUCOSE 190*  BUN 28*  CREATININE 2.09*  CALCIUM 9.8   GFR: Estimated Creatinine Clearance: 23.7 mL/min (A) (by C-G formula based on SCr of 2.09 mg/dL (H)). Liver Function Tests:  Recent Labs Lab 12/11/16 1722  AST 16  ALT 16  ALKPHOS 141*  BILITOT 0.6  PROT 7.7  ALBUMIN 4.4    Recent Labs Lab 12/11/16 1722  LIPASE 29   No results for input(s): AMMONIA in the last 168 hours. Coagulation Profile: No results for input(s): INR, PROTIME in the last 168 hours. Cardiac Enzymes: No results for input(s): CKTOTAL, CKMB, CKMBINDEX, TROPONINI in the last 168 hours. BNP (last 3 results) No results for input(s): PROBNP in the last 8760 hours. HbA1C: No results for input(s): HGBA1C in the last 72 hours. CBG:  Recent Labs Lab 12/11/16 1932  GLUCAP 157*   Lipid Profile: No results for input(s): CHOL, HDL, LDLCALC, TRIG, CHOLHDL, LDLDIRECT in the last 72 hours. Thyroid Function Tests: No results for input(s): TSH, T4TOTAL, FREET4, T3FREE, THYROIDAB in the last 72 hours. Anemia Panel: No results for input(s): VITAMINB12, FOLATE, FERRITIN, TIBC, IRON, RETICCTPCT in the last 72 hours. Urine analysis:    Component Value Date/Time   COLORURINE YELLOW 12/11/2016 1658   APPEARANCEUR HAZY (A) 12/11/2016 1658   LABSPEC 1.025 12/11/2016 1658   PHURINE 5.0 12/11/2016 1658   GLUCOSEU >=500 (A) 12/11/2016 1658   HGBUR NEGATIVE 12/11/2016 1658   BILIRUBINUR NEGATIVE 12/11/2016 1658   KETONESUR NEGATIVE 12/11/2016 1658   PROTEINUR NEGATIVE 12/11/2016 1658   UROBILINOGEN 0.2 06/12/2015 1417   NITRITE NEGATIVE 12/11/2016 1658   LEUKOCYTESUR TRACE (A) 12/11/2016 1658   Sepsis Labs: '@LABRCNTIP'$ (procalcitonin:4,lacticidven:4) )No results found for this or any previous visit (from the past 240 hour(s)).   Radiological Exams on Admission: Ct Head Wo Contrast  Result Date: 12/11/2016 CLINICAL DATA:  Passed out, dizziness and nausea. EXAM: CT HEAD WITHOUT CONTRAST TECHNIQUE: Contiguous  axial images were obtained from the base of the skull through the vertex without intravenous contrast. COMPARISON:  06/12/2015 MRI and CT FINDINGS: Brain: Small focus of encephalomalacia along the posterior left parietal lobe consistent with remote left MCA distribution infarct. No acute intracranial hemorrhage, midline shift or edema. No large vascular territory infarction. No intra-axial mass nor extra-axial fluid. No ventriculomegaly. No effacement of the basal cisterns. Fourth ventricle is midline. No acute cervical  abnormality. Vascular: No hyperdense vessel or unexpected calcification. Skull: Normal. Negative for fracture or focal lesion. Sinuses/Orbits: No acute finding. Small mucous retention cyst in the sphenoid. Other: None. IMPRESSION: 1. Small focus of encephalomalacia in the high left posterior parietal lobe from remote MCA distribution infarct. 2. No acute intracranial abnormality. Electronically Signed   By: Ashley Royalty M.D.   On: 12/11/2016 18:05   Dg Chest Port 1 View  Result Date: 12/11/2016 CLINICAL DATA:  Sepsis EXAM: PORTABLE CHEST 1 VIEW COMPARISON:  06/12/2015 FINDINGS: The heart size and mediastinal contours are within normal limits. Both lungs are clear. The visualized skeletal structures are unremarkable. IMPRESSION: No active disease. Electronically Signed   By: Inez Catalina M.D.   On: 12/11/2016 19:28    EKG: Independently reviewed. Sinus rhythm, LVH with secondary repolarization abnormality.   Assessment/Plan  1. Syncope  - Pt presents following a syncopal episode upon standing  - This was preceded by recent initiation of Aldactone, several days of nausea, poor appetite, and non-specific malaise, then spent the day prior to admission at an outdoor event, did not eat or drink the day of admission  - The preceding events, EMS report of severe orthostasis on the scene, and clinical dehydration suggest hypovolemia with orthostatic syncope  - There is no focal neurologic  deficit and CT head is negative for acute intracranial abnormality  - She was given 300 cc NS en route and 2 liters NS in ED  - Plan to monitor on telemetry, continue IVF hydration, hold diuretic, and obtain echocardiogram   2. Acute kidney injury  - SCr is 2.09 on admission, up from 0.83 two weeks earlier   - Suspect this is secondary to recent initiation of Aldactone and valsartan, plus poor appetite and dehydration  - Plan to hold ARB and diuretic, continue IVF hydration, check urine studies, repeat chem panel in am   3. Leukocytosis, elevated lactate  - WBC is 15,200 and lactic acid 3.23 on admission  - She is afebrile, UA does not suggest infection, and CXR appears clear  - Suspect the lactate elevation is secondary to dehydration and anticipate clearance with IVF  - Leukocytosis could be reactive, and there appears to be some hemoconcentration, but with concomitant soft BP, AKI, and elevated lactate, will obtain blood cultures    4. Hyponatremia  - Serum sodium is 133 in setting of hypovolemia  - She has been fluid-resuscitated in ED and continued on maintenance IVF  - Repeat chem panel in am    5. Hypothyroidism  - Appears to be stable, continue Synthroid    6. Depression  - Stable, continue Lexapro    7. Hypertension - BP soft on arrival  - Started Aldactone and valsartan on 11/24/16; held as above  - Monitor BP and treat if needed   8. Insulin-dependent DM  - A1c 10.3% in January 2018  - Managed at home with Lantus 35 units qHS and Novolog 8 units with breakfast, 4 units with lunch, and 30 units with dinner  - Check CBG with meals and qHS - Continue Lantus 35 units qHS, start Novolog 6 units TID with meals and Novolog per moderate-intensity correctional    DVT prophylaxis: sq heparin  Code Status: Full  Family Communication: Discussed with patient Disposition Plan: Admit to telemetry Consults called: None Admission status: Inpatient    Vianne Bulls, MD [facility] Pager [phone]  If 7PM-7AM, please contact night-coverage [email or web address] Password [facility]  12/11/2016, 7:35 PM

## 2016-12-12 LAB — CBC
HCT: 39.3 % (ref 36.0–46.0)
Hemoglobin: 12.5 g/dL (ref 12.0–15.0)
MCH: 28 pg (ref 26.0–34.0)
MCHC: 31.8 g/dL (ref 30.0–36.0)
MCV: 88.1 fL (ref 78.0–100.0)
Platelets: 161 10*3/uL (ref 150–400)
RBC: 4.46 MIL/uL (ref 3.87–5.11)
RDW: 13.5 % (ref 11.5–15.5)
WBC: 8.7 10*3/uL (ref 4.0–10.5)

## 2016-12-12 LAB — BASIC METABOLIC PANEL
Anion gap: 12 (ref 5–15)
BUN: 32 mg/dL — ABNORMAL HIGH (ref 6–20)
CO2: 25 mmol/L (ref 22–32)
Calcium: 8.7 mg/dL — ABNORMAL LOW (ref 8.9–10.3)
Chloride: 99 mmol/L — ABNORMAL LOW (ref 101–111)
Creatinine, Ser: 2.17 mg/dL — ABNORMAL HIGH (ref 0.44–1.00)
GFR calc Af Amer: 26 mL/min — ABNORMAL LOW (ref >60)
GFR calc non Af Amer: 22 mL/min — ABNORMAL LOW (ref >60)
Glucose, Bld: 201 mg/dL — ABNORMAL HIGH (ref 65–99)
Potassium: 4.2 mmol/L (ref 3.5–5.1)
Sodium: 136 mmol/L (ref 135–145)

## 2016-12-12 LAB — CREATININE, URINE, RANDOM: Creatinine, Urine: 449.73 mg/dL

## 2016-12-12 LAB — GLUCOSE, CAPILLARY
Glucose-Capillary: 206 mg/dL — ABNORMAL HIGH (ref 65–99)
Glucose-Capillary: 166 mg/dL — ABNORMAL HIGH (ref 65–99)
Glucose-Capillary: 251 mg/dL — ABNORMAL HIGH (ref 65–99)
Glucose-Capillary: 154 mg/dL — ABNORMAL HIGH (ref 65–99)

## 2016-12-12 LAB — SODIUM, URINE, RANDOM: Sodium, Ur: 22 mmol/L

## 2016-12-12 LAB — MRSA PCR SCREENING: MRSA by PCR: NEGATIVE

## 2016-12-12 LAB — LACTIC ACID, PLASMA: Lactic Acid, Venous: 1.6 mmol/L (ref 0.5–1.9)

## 2016-12-12 MED ORDER — SODIUM CHLORIDE 0.9 % IV SOLN
INTRAVENOUS | Status: DC
  Administered 2016-12-12 (×2): via INTRAVENOUS
  Administered 2016-12-13: 1000 mL via INTRAVENOUS
  Administered 2016-12-13 – 2016-12-14 (×3): via INTRAVENOUS

## 2016-12-12 NOTE — Progress Notes (Signed)
PROGRESS NOTE    Sonia Skinner  SNK:[id number] DOB: May 29, 1951 DOA: 12/11/2016 PCP: Scarlette Calico, MD    Brief Narrative: Sonia Skinner is a 66 y.o. female with medical history significant for hypertension, insulin-dependent diabetes mellitus, depression, carotid artery occlusion with CVA status post CEA, and hypothyroidism who presents to the emergency department following a syncopal event.   Assessment & Plan:   Principal Problem:   Syncope Active Problems:   Essential hypertension, benign   DM type 2 (diabetes mellitus, type 2) (HCC)   Hypothyroidism   History of stroke   Depression   AKI (acute kidney injury) ([place])   Leukocytosis   SIRS (systemic inflammatory response syndrome) (HCC)   Hyponatremia   Elevated lactic acid level   Syncope: Possibly from dehydration and hypovolemia and orthostatic syncope.  Hydrate and repeat orthostatic in am.  Telemetry monitoring. Echocardiogram and holding all diuretics.    Acute renal failure:  Possibly from ARB and diuretic and hypovolemia.  Hydrate and repeat in am.  US renal to check for hydronephrosis.  UA is negative for infection Urine for creatinine and sodium ordered and 449 and 22 respectively.   Hypothyroidism: check TSH and resume synthroid.    Elevated lactic acid: possibly from dehydration , improved with hydration.   Type 2 DM:  CBG (last 3)   Recent Labs  12/11/16 2141 12/12/16 0741 12/12/16 1234  GLUCAP 129* 206* 166*    RESUME SSI.   Hypotension on admission from dehydration.    DVT prophylaxis: HEPARIN. Code Status: full code.  Family Communication: none at bedside.  Disposition Plan: pending further eval.    Consultants:   None.    Procedures: US RENAL.    Antimicrobials: none.    Subjective:   Objective: Vitals:   12/12/16 0412 12/12/16 0500 12/12/16 0650 12/12/16 1453  BP: (!) 75/41  (!) 107/49 (!) 121/59  Pulse: 97  88 100  Resp:    20  Temp:    98 F (36.7 C)  TempSrc:     Oral  SpO2:   98% 95%  Weight:  68.4 kg (150 lb 11.2 oz)    Height:        Intake/Output Summary (Last 24 hours) at 12/12/16 1509 Last data filed at 12/12/16 1504  Gross per 24 hour  Intake          1091.67 ml  Output              300 ml  Net           791.67 ml   Filed Weights   12/11/16 2344 12/12/16 0500  Weight: 68.4 kg (150 lb 11.2 oz) 68.4 kg (150 lb 11.2 oz)    Examination:  General exam: Appears calm and comfortable  Respiratory system: Clear to auscultation. Respiratory effort normal. Cardiovascular system: S1 & S2 heard, RRR. No JVD, murmurs, rubs, gallops or clicks. No pedal edema. Gastrointestinal system: Abdomen is nondistended, soft and nontender. No organomegaly or masses felt. Normal bowel sounds heard. Central nervous system: Alert and oriented. No focal neurological deficits. Extremities: Symmetric 5 x 5 power. Skin: No rashes, lesions or ulcers Psychiatry: Judgement and insight appear normal. Mood & affect appropriate.     Data Reviewed: I have personally reviewed following labs and imaging studies  CBC:  Recent Labs Lab 12/11/16 1722 12/12/16 0536  WBC 15.2* 8.7  NEUTROABS 10.8*  --   HGB 14.8 12.5  HCT 44.6 39.3  MCV 86.6 88.1  PLT 175  676   Basic Metabolic Panel:  Recent Labs Lab 12/11/16 1722 12/12/16 0536  NA 133* 136  K 3.9 4.2  CL 94* 99*  CO2 24 25  GLUCOSE 190* 201*  BUN 28* 32*  CREATININE 2.09* 2.17*  CALCIUM 9.8 8.7*   GFR: Estimated Creatinine Clearance: 22.8 mL/min (A) (by C-G formula based on SCr of 2.17 mg/dL (H)). Liver Function Tests:  Recent Labs Lab 12/11/16 1722  AST 16  ALT 16  ALKPHOS 141*  BILITOT 0.6  PROT 7.7  ALBUMIN 4.4    Recent Labs Lab 12/11/16 1722  LIPASE 29   No results for input(s): AMMONIA in the last 168 hours. Coagulation Profile: No results for input(s): INR, PROTIME in the last 168 hours. Cardiac Enzymes: No results for input(s): CKTOTAL, CKMB, CKMBINDEX, TROPONINI in the  last 168 hours. BNP (last 3 results) No results for input(s): PROBNP in the last 8760 hours. HbA1C: No results for input(s): HGBA1C in the last 72 hours. CBG:  Recent Labs Lab 12/11/16 1932 12/11/16 2141 12/12/16 0741 12/12/16 1234  GLUCAP 157* 129* 206* 166*   Lipid Profile: No results for input(s): CHOL, HDL, LDLCALC, TRIG, CHOLHDL, LDLDIRECT in the last 72 hours. Thyroid Function Tests: No results for input(s): TSH, T4TOTAL, FREET4, T3FREE, THYROIDAB in the last 72 hours. Anemia Panel: No results for input(s): VITAMINB12, FOLATE, FERRITIN, TIBC, IRON, RETICCTPCT in the last 72 hours. Sepsis Labs:  Recent Labs Lab 12/11/16 1754 12/11/16 2116 12/11/16 2353  LATICACIDVEN 3.23* 1.7 1.6    Recent Results (from the past 240 hour(s))  Culture, blood (routine x 2)     Status: None (Preliminary result)   Collection Time: 12/11/16  9:15 PM  Result Value Ref Range Status   Specimen Description BLOOD LEFT ARM  Final   Special Requests   Final    BOTTLES DRAWN AEROBIC ONLY Blood Culture adequate volume   Culture NO GROWTH < 24 HOURS  Final   Report Status PENDING  Incomplete  Culture, blood (routine x 2)     Status: None (Preliminary result)   Collection Time: 12/11/16  9:20 PM  Result Value Ref Range Status   Specimen Description BLOOD LEFT HAND  Final   Special Requests   Final    BOTTLES DRAWN AEROBIC ONLY Blood Culture adequate volume   Culture NO GROWTH < 24 HOURS  Final   Report Status PENDING  Incomplete  MRSA PCR Screening     Status: None   Collection Time: 12/12/16 12:25 AM  Result Value Ref Range Status   MRSA by PCR NEGATIVE NEGATIVE Final    Comment:        The GeneXpert MRSA Assay (FDA approved for NASAL specimens only), is one component of a comprehensive MRSA colonization surveillance program. It is not intended to diagnose MRSA infection nor to guide or monitor treatment for MRSA infections.          Radiology Studies: Ct Head Wo  Contrast  Result Date: 12/11/2016 CLINICAL DATA:  Passed out, dizziness and nausea. EXAM: CT HEAD WITHOUT CONTRAST TECHNIQUE: Contiguous axial images were obtained from the base of the skull through the vertex without intravenous contrast. COMPARISON:  06/12/2015 MRI and CT FINDINGS: Brain: Small focus of encephalomalacia along the posterior left parietal lobe consistent with remote left MCA distribution infarct. No acute intracranial hemorrhage, midline shift or edema. No large vascular territory infarction. No intra-axial mass nor extra-axial fluid. No ventriculomegaly. No effacement of the basal cisterns. Fourth ventricle is midline. No acute  cervical abnormality. Vascular: No hyperdense vessel or unexpected calcification. Skull: Normal. Negative for fracture or focal lesion. Sinuses/Orbits: No acute finding. Small mucous retention cyst in the sphenoid. Other: None. IMPRESSION: 1. Small focus of encephalomalacia in the high left posterior parietal lobe from remote MCA distribution infarct. 2. No acute intracranial abnormality. Electronically Signed   By: Ashley Royalty M.D.   On: 12/11/2016 18:05   Dg Chest Port 1 View  Result Date: 12/11/2016 CLINICAL DATA:  Sepsis EXAM: PORTABLE CHEST 1 VIEW COMPARISON:  06/12/2015 FINDINGS: The heart size and mediastinal contours are within normal limits. Both lungs are clear. The visualized skeletal structures are unremarkable. IMPRESSION: No active disease. Electronically Signed   By: Inez Catalina M.D.   On: 12/11/2016 19:28        Scheduled Meds: . amitriptyline  75 mg Oral QHS  . aspirin  325 mg Oral Daily  . atorvastatin  40 mg Oral Daily  . cholecalciferol  2,000 Units Oral Daily  . escitalopram  20 mg Oral Daily  . heparin  5,000 Units Subcutaneous Q8H  . insulin aspart  0-15 Units Subcutaneous TID WC  . insulin aspart  0-5 Units Subcutaneous QHS  . insulin aspart  6 Units Subcutaneous TID WC  . insulin glargine  35 Units Subcutaneous QHS  .  lamoTRIgine  50 mg Oral Daily  . levothyroxine  125 mcg Oral QAC breakfast  . ondansetron (ZOFRAN) IV  4 mg Intravenous Once  . pantoprazole  40 mg Oral Daily  . pneumococcal 23 valent vaccine  0.5 mL Intramuscular Tomorrow-1000  . sodium chloride flush  3 mL Intravenous Q12H   Continuous Infusions: . sodium chloride    . sodium chloride       LOS: 1 day    Time spent: 35 min    Paeton Studer, MD [facility] Pager [phone]  If 7PM-7AM, please contact night-coverage [email or web address] Password [email or web address] 12/12/2016, 3:09 PM

## 2016-12-13 ENCOUNTER — Inpatient Hospital Stay (HOSPITAL_COMMUNITY): Payer: Medicare Other

## 2016-12-13 DIAGNOSIS — R55 Syncope and collapse: Secondary | ICD-10-CM

## 2016-12-13 LAB — BASIC METABOLIC PANEL
Anion gap: 7 (ref 5–15)
BUN: 23 mg/dL — ABNORMAL HIGH (ref 6–20)
CO2: 22 mmol/L (ref 22–32)
Calcium: 8.1 mg/dL — ABNORMAL LOW (ref 8.9–10.3)
Chloride: 105 mmol/L (ref 101–111)
Creatinine, Ser: 1.22 mg/dL — ABNORMAL HIGH (ref 0.44–1.00)
GFR calc Af Amer: 52 mL/min — ABNORMAL LOW (ref >60)
GFR calc non Af Amer: 45 mL/min — ABNORMAL LOW (ref >60)
Glucose, Bld: 381 mg/dL — ABNORMAL HIGH (ref 65–99)
Potassium: 4.2 mmol/L (ref 3.5–5.1)
Sodium: 134 mmol/L — ABNORMAL LOW (ref 135–145)

## 2016-12-13 LAB — GLUCOSE, CAPILLARY
Glucose-Capillary: 332 mg/dL — ABNORMAL HIGH (ref 65–99)
Glucose-Capillary: 463 mg/dL — ABNORMAL HIGH (ref 65–99)
Glucose-Capillary: 149 mg/dL — ABNORMAL HIGH (ref 65–99)
Glucose-Capillary: 212 mg/dL — ABNORMAL HIGH (ref 65–99)

## 2016-12-13 LAB — T4, FREE: Free T4: 1.52 ng/dL — ABNORMAL HIGH (ref 0.61–1.12)

## 2016-12-13 LAB — UREA NITROGEN, URINE: Urea Nitrogen, Ur: 170 mg/dL

## 2016-12-13 LAB — ECHOCARDIOGRAM COMPLETE
Height: 62 in
Weight: 2499.2 oz

## 2016-12-13 MED ORDER — INSULIN ASPART 100 UNIT/ML ~~LOC~~ SOLN
0.0000 [IU] | Freq: Three times a day (TID) | SUBCUTANEOUS | Status: DC
  Administered 2016-12-13: 20 [IU] via SUBCUTANEOUS
  Administered 2016-12-13: 3 [IU] via SUBCUTANEOUS
  Administered 2016-12-14: 7 [IU] via SUBCUTANEOUS

## 2016-12-13 MED ORDER — INSULIN GLARGINE 100 UNIT/ML ~~LOC~~ SOLN
40.0000 [IU] | Freq: Every day | SUBCUTANEOUS | Status: DC
  Administered 2016-12-13: 40 [IU] via SUBCUTANEOUS
  Filled 2016-12-13: qty 0.4

## 2016-12-13 MED ORDER — ALUM & MAG HYDROXIDE-SIMETH 200-200-20 MG/5ML PO SUSP: 30.0000 mL | ORAL | Status: DC | PRN

## 2016-12-13 MED ORDER — PANTOPRAZOLE SODIUM 40 MG PO TBEC
40.0000 mg | DELAYED_RELEASE_TABLET | Freq: Every day | ORAL | Status: DC
  Administered 2016-12-13 – 2016-12-14 (×2): 40 mg via ORAL
  Filled 2016-12-13 (×2): qty 1

## 2016-12-13 MED ORDER — GABAPENTIN 100 MG PO CAPS
100.0000 mg | ORAL_CAPSULE | Freq: Two times a day (BID) | ORAL | Status: DC
Start: 2016-12-13 — End: 2016-12-14
  Administered 2016-12-13 – 2016-12-14 (×3): 100 mg via ORAL
  Filled 2016-12-13 (×3): qty 1

## 2016-12-13 MED ORDER — CYCLOBENZAPRINE HCL 5 MG PO TABS
5.0000 mg | ORAL_TABLET | Freq: Three times a day (TID) | ORAL | Status: DC | PRN
  Administered 2016-12-13 – 2016-12-14 (×2): 5 mg via ORAL
  Filled 2016-12-13 (×2): qty 1

## 2016-12-13 MED ORDER — AMLODIPINE BESYLATE 2.5 MG PO TABS
2.5000 mg | ORAL_TABLET | Freq: Every day | ORAL | Status: DC
  Administered 2016-12-14: 2.5 mg via ORAL
  Filled 2016-12-13: qty 1

## 2016-12-13 NOTE — Progress Notes (Signed)
CBG at noon was 463. Per MD order, resident received 6 units coverage with meals plus 20 units per sliding scale. Will continue to monitor.

## 2016-12-13 NOTE — Progress Notes (Signed)
  Echocardiogram 2D Echocardiogram has been performed.  Sonia Skinner 12/13/2016, 12:25 PM

## 2016-12-13 NOTE — Progress Notes (Signed)
Inpatient Diabetes Program Recommendations  AACE/ADA: New Consensus Statement on Inpatient Glycemic Control (2015)  Target Ranges:  Prepandial:   less than 140 mg/dL      Peak postprandial:   less than 180 mg/dL (1-2 hours)      Critically ill patients:  140 - 180 mg/dL   Lab Results  Component Value Date   GLUCAP 332 (H) 12/13/2016   HGBA1C 10.3 09/07/2016    Review of Glycemic Control  Diabetes history: DM2 Outpatient Diabetes medications: Lantus 35 units QHS, Novolog 03-19-29 units tidwc, Bydureon 2 mg -  1 injection/week. Current orders for Inpatient glycemic control: Lantus 35 units QHS, Novolog 0-15 units tidwc and hs + 6 units tidwc Last HgbA1C - 10.3% on 09/07/2016  Inpatient Diabetes Program Recommendations:    Please order HgbA1C to assess glycemic control prior to hospitalization Increase Lantus to 38 units QHS If post-prandial blood sugars > 180 mg/dL, titrate meal coverage insulin.  Will follow.  Thank you. Lorenda Peck, RD, LDN, CDE Inpatient Diabetes Coordinator [phone]

## 2016-12-13 NOTE — Progress Notes (Signed)
PROGRESS NOTE    Sonia Skinner  IRS:[id number] DOB: Feb 09, 1951 DOA: 12/11/2016 PCP: Scarlette Calico, MD    Brief Narrative: Sonia Skinner is a 67 y.o. female with medical history significant for hypertension, insulin-dependent diabetes mellitus, depression, carotid artery occlusion with CVA status post CEA, and hypothyroidism who presents to the emergency department following a syncopal event.   Assessment & Plan:   Principal Problem:   Syncope Active Problems:   Essential hypertension, benign   DM type 2 (diabetes mellitus, type 2) (HCC)   Hypothyroidism   History of stroke   Depression   AKI (acute kidney injury) ([place])   Leukocytosis   SIRS (systemic inflammatory response syndrome) (HCC)   Hyponatremia   Elevated lactic acid level   Syncope: Possibly from dehydration and hypovolemia and orthostatic syncope.  Hydrate and repeat orthostatic in am. Orthostatics pending.  Telemetry monitoring. Echocardiogram and holding all diuretics.  Echocardiogram shows diastolic dysfunction and elevated filling pressures.  Patient still reports dizziness on standing up, would recommend fluids for another 24 hours.    Acute renal failure:  Possibly from ARB and diuretic and hypovolemia.  Hydrate and repeated renal parameters today show much improvement, but not back to baseline.   US renal is negative  for hydronephrosis.  UA is negative for infection Urine for creatinine and sodium ordered and 449 and 22 respectively.   Hypothyroidism: tsh elevated on 4/11 , get free t4 and free t3.  and resume synthroid.    Elevated lactic acid: possibly from dehydration , improved with hydration.   Type 2 DM:  CBG (last 3)   Recent Labs  12/12/16 2146 12/13/16 0747 12/13/16 1156  GLUCAP 154* 332* 463*   Uncontrolled, increased lantus to 40 units and ssi to resistant scale.     Hypotension on admission from dehydration. Resolved.    DVT prophylaxis: HEPARIN. Code Status: full code.  Family  Communication: none at bedside.  Disposition Plan: pending further eval.    Consultants:   None.    Procedures: US RENAL.    Antimicrobials: none.    Subjective:  No new complaints.  Objective: Vitals:   12/13/16 0449 12/13/16 0602 12/13/16 0813 12/13/16 1200  BP: 135/61  (!) 128/57 (!) 141/63  Pulse: 97  94 95  Resp: 16  16 16   Temp: 98.2 F (36.8 C)  97.9 F (36.6 C) 97.8 F (36.6 C)  TempSrc: Oral  Oral Oral  SpO2: 99%  100% 100%  Weight:  70.9 kg (156 lb 3.2 oz)    Height:        Intake/Output Summary (Last 24 hours) at 12/13/16 1415 Last data filed at 12/13/16 1118  Gross per 24 hour  Intake          2623.75 ml  Output             1800 ml  Net           823.75 ml   Filed Weights   12/11/16 2344 12/12/16 0500 12/13/16 0602  Weight: 68.4 kg (150 lb 11.2 oz) 68.4 kg (150 lb 11.2 oz) 70.9 kg (156 lb 3.2 oz)    Examination:  General exam: Appears calm and comfortable  Respiratory system: Clear to auscultation. Respiratory effort normal. Cardiovascular system: S1 & S2 heard, RRR. No JVD, murmurs, rubs, gallops or clicks. No pedal edema. Gastrointestinal system: Abdomen is nondistended, soft and nontender. No organomegaly or masses felt. Normal bowel sounds heard. Central nervous system: Alert and oriented. No focal neurological  deficits. Extremities: Symmetric 5 x 5 power. Skin: No rashes, lesions or ulcers Psychiatry: Judgement and insight appear normal. Mood & affect appropriate.     Data Reviewed: I have personally reviewed following labs and imaging studies  CBC:  Recent Labs Lab 12/11/16 1722 12/12/16 0536  WBC 15.2* 8.7  NEUTROABS 10.8*  --   HGB 14.8 12.5  HCT 44.6 39.3  MCV 86.6 88.1  PLT 175 735   Basic Metabolic Panel:  Recent Labs Lab 12/11/16 1722 12/12/16 0536 12/13/16 0952  NA 133* 136 134*  K 3.9 4.2 4.2  CL 94* 99* 105  CO2 24 25 22   GLUCOSE 190* 201* 381*  BUN 28* 32* 23*  CREATININE 2.09* 2.17* 1.22*  CALCIUM 9.8  8.7* 8.1*   GFR: Estimated Creatinine Clearance: 41.3 mL/min (A) (by C-G formula based on SCr of 1.22 mg/dL (H)). Liver Function Tests:  Recent Labs Lab 12/11/16 1722  AST 16  ALT 16  ALKPHOS 141*  BILITOT 0.6  PROT 7.7  ALBUMIN 4.4    Recent Labs Lab 12/11/16 1722  LIPASE 29   No results for input(s): AMMONIA in the last 168 hours. Coagulation Profile: No results for input(s): INR, PROTIME in the last 168 hours. Cardiac Enzymes: No results for input(s): CKTOTAL, CKMB, CKMBINDEX, TROPONINI in the last 168 hours. BNP (last 3 results) No results for input(s): PROBNP in the last 8760 hours. HbA1C: No results for input(s): HGBA1C in the last 72 hours. CBG:  Recent Labs Lab 12/12/16 1234 12/12/16 1705 12/12/16 2146 12/13/16 0747 12/13/16 1156  GLUCAP 166* 251* 154* 332* 463*   Lipid Profile: No results for input(s): CHOL, HDL, LDLCALC, TRIG, CHOLHDL, LDLDIRECT in the last 72 hours. Thyroid Function Tests: No results for input(s): TSH, T4TOTAL, FREET4, T3FREE, THYROIDAB in the last 72 hours. Anemia Panel: No results for input(s): VITAMINB12, FOLATE, FERRITIN, TIBC, IRON, RETICCTPCT in the last 72 hours. Sepsis Labs:  Recent Labs Lab 12/11/16 1754 12/11/16 2116 12/11/16 2353  LATICACIDVEN 3.23* 1.7 1.6    Recent Results (from the past 240 hour(s))  Culture, blood (routine x 2)     Status: None (Preliminary result)   Collection Time: 12/11/16  9:15 PM  Result Value Ref Range Status   Specimen Description BLOOD LEFT ARM  Final   Special Requests   Final    BOTTLES DRAWN AEROBIC ONLY Blood Culture adequate volume   Culture NO GROWTH < 24 HOURS  Final   Report Status PENDING  Incomplete  Culture, blood (routine x 2)     Status: None (Preliminary result)   Collection Time: 12/11/16  9:20 PM  Result Value Ref Range Status   Specimen Description BLOOD LEFT HAND  Final   Special Requests   Final    BOTTLES DRAWN AEROBIC ONLY Blood Culture adequate volume    Culture NO GROWTH < 24 HOURS  Final   Report Status PENDING  Incomplete  MRSA PCR Screening     Status: None   Collection Time: 12/12/16 12:25 AM  Result Value Ref Range Status   MRSA by PCR NEGATIVE NEGATIVE Final    Comment:        The GeneXpert MRSA Assay (FDA approved for NASAL specimens only), is one component of a comprehensive MRSA colonization surveillance program. It is not intended to diagnose MRSA infection nor to guide or monitor treatment for MRSA infections.          Radiology Studies: Ct Head Wo Contrast  Result Date: 12/11/2016 CLINICAL DATA:  Passed out, dizziness and nausea. EXAM: CT HEAD WITHOUT CONTRAST TECHNIQUE: Contiguous axial images were obtained from the base of the skull through the vertex without intravenous contrast. COMPARISON:  06/12/2015 MRI and CT FINDINGS: Brain: Small focus of encephalomalacia along the posterior left parietal lobe consistent with remote left MCA distribution infarct. No acute intracranial hemorrhage, midline shift or edema. No large vascular territory infarction. No intra-axial mass nor extra-axial fluid. No ventriculomegaly. No effacement of the basal cisterns. Fourth ventricle is midline. No acute cervical abnormality. Vascular: No hyperdense vessel or unexpected calcification. Skull: Normal. Negative for fracture or focal lesion. Sinuses/Orbits: No acute finding. Small mucous retention cyst in the sphenoid. Other: None. IMPRESSION: 1. Small focus of encephalomalacia in the high left posterior parietal lobe from remote MCA distribution infarct. 2. No acute intracranial abnormality. Electronically Signed   By: Ashley Royalty M.D.   On: 12/11/2016 18:05   US Renal  Result Date: 12/13/2016 CLINICAL DATA:  Hydronephrosis. EXAM: RENAL / URINARY TRACT ULTRASOUND COMPLETE COMPARISON:  CT 08/17/2009. FINDINGS: Right Kidney: Length: 10.3 cm. Echogenicity within normal limits. No mass or hydronephrosis visualized. Left Kidney: Length: 10.2 cm.  Echogenicity within normal limits. Dromedary hump noted. No mass or hydronephrosis visualized. Bladder: Appears normal for degree of bladder distention. Bilateral ureteral jets noted. IMPRESSION: Negative exam.  No evidence of hydronephrosis. Electronically Signed   By: Marcello Moores  Register   On: 12/13/2016 06:58   Dg Chest Port 1 View  Result Date: 12/11/2016 CLINICAL DATA:  Sepsis EXAM: PORTABLE CHEST 1 VIEW COMPARISON:  06/12/2015 FINDINGS: The heart size and mediastinal contours are within normal limits. Both lungs are clear. The visualized skeletal structures are unremarkable. IMPRESSION: No active disease. Electronically Signed   By: Inez Catalina M.D.   On: 12/11/2016 19:28        Scheduled Meds: . amitriptyline  75 mg Oral QHS  . [START ON 12/14/2016] amLODipine  2.5 mg Oral Daily  . aspirin  325 mg Oral Daily  . atorvastatin  40 mg Oral Daily  . cholecalciferol  2,000 Units Oral Daily  . escitalopram  20 mg Oral Daily  . gabapentin  100 mg Oral BID  . heparin  5,000 Units Subcutaneous Q8H  . insulin aspart  0-20 Units Subcutaneous TID WC  . insulin aspart  0-5 Units Subcutaneous QHS  . insulin aspart  6 Units Subcutaneous TID WC  . insulin glargine  40 Units Subcutaneous QHS  . lamoTRIgine  50 mg Oral Daily  . levothyroxine  125 mcg Oral QAC breakfast  . ondansetron (ZOFRAN) IV  4 mg Intravenous Once  . pantoprazole  40 mg Oral Daily  . pneumococcal 23 valent vaccine  0.5 mL Intramuscular Tomorrow-1000  . sodium chloride flush  3 mL Intravenous Q12H   Continuous Infusions: . sodium chloride 1,000 mL (12/13/16 1219)  . sodium chloride       LOS: 2 days    Time spent: 35 min    Sonia Dorion, MD [facility] Pager [phone]  If 7PM-7AM, please contact night-coverage [email or web address] Password [email or web address] 12/13/2016, 2:15 PM

## 2016-12-14 DIAGNOSIS — R55 Syncope and collapse: Secondary | ICD-10-CM

## 2016-12-14 DIAGNOSIS — Z794 Long term (current) use of insulin: Secondary | ICD-10-CM

## 2016-12-14 DIAGNOSIS — E113521 Type 2 diabetes mellitus with proliferative diabetic retinopathy with traction retinal detachment involving the macula, right eye: Secondary | ICD-10-CM

## 2016-12-14 DIAGNOSIS — N179 Acute kidney failure, unspecified: Principal | ICD-10-CM

## 2016-12-14 LAB — BASIC METABOLIC PANEL
Anion gap: 6 (ref 5–15)
BUN: 15 mg/dL (ref 6–20)
CO2: 22 mmol/L (ref 22–32)
Calcium: 8.1 mg/dL — ABNORMAL LOW (ref 8.9–10.3)
Chloride: 110 mmol/L (ref 101–111)
Creatinine, Ser: 1.03 mg/dL — ABNORMAL HIGH (ref 0.44–1.00)
GFR calc Af Amer: >60 mL/min (ref >60)
GFR calc non Af Amer: 55 mL/min — ABNORMAL LOW (ref >60)
Glucose, Bld: 312 mg/dL — ABNORMAL HIGH (ref 65–99)
Potassium: 4.2 mmol/L (ref 3.5–5.1)
Sodium: 138 mmol/L (ref 135–145)

## 2016-12-14 LAB — GLUCOSE, CAPILLARY
Glucose-Capillary: 229 mg/dL — ABNORMAL HIGH (ref 65–99)
Glucose-Capillary: 324 mg/dL — ABNORMAL HIGH (ref 65–99)

## 2016-12-14 LAB — T3, FREE: T3, Free: 3 pg/mL (ref 2.0–4.4)

## 2016-12-14 MED ORDER — GABAPENTIN 100 MG PO CAPS: 100.0000 mg | ORAL_CAPSULE | Freq: Two times a day (BID) | ORAL | 0 refills | Status: DC

## 2016-12-14 MED ORDER — CYCLOBENZAPRINE HCL 5 MG PO TABS: 5.0000 mg | ORAL_TABLET | Freq: Three times a day (TID) | ORAL | 0 refills | Status: DC | PRN

## 2016-12-14 MED ORDER — AMLODIPINE BESYLATE 2.5 MG PO TABS: 2.5000 mg | ORAL_TABLET | Freq: Every day | ORAL | 0 refills | Status: DC

## 2016-12-14 MED ORDER — INSULIN GLARGINE 100 UNIT/ML ~~LOC~~ SOLN: 40.0000 [IU] | Freq: Every day | SUBCUTANEOUS | 11 refills | Status: DC

## 2016-12-14 MED ORDER — HYDROCODONE-ACETAMINOPHEN 5-325 MG PO TABS: 1.0000 | ORAL_TABLET | Freq: Three times a day (TID) | ORAL | 0 refills | Status: DC | PRN

## 2016-12-14 NOTE — Discharge Summary (Signed)
Physician Discharge Summary  Sonia Skinner MEQ:[id number] DOB: [birth year] DOA: 12/11/2016  PCP: Scarlette Calico, MD  Admit date: 12/11/2016 Discharge date: 12/14/2016  Admitted From: Home.  Disposition: Home.   Recommendations for Outpatient Follow-up:  1. Follow up with PCP in 1-2 weeks 2. Please obtain BMP/CBC in one week   Discharge Condition:stable.  CODE STATUS:full code.  Diet recommendation: Heart Healthy Brief/Interim Summary: Sonia Skinner a 66 y.o.femalewith medical history significant for hypertension, insulin-dependent diabetes mellitus, depression, carotid artery occlusion with CVA status post CEA, and hypothyroidism who presents to the emergency department following a syncopal event.   Discharge Diagnoses:  Principal Problem:   Syncope Active Problems:   Essential hypertension, benign   DM type 2 (diabetes mellitus, type 2) (HCC)   Hypothyroidism   History of stroke   Depression   AKI (acute kidney injury) ([place])   Leukocytosis   SIRS (systemic inflammatory response syndrome) (HCC)   Hyponatremia   Elevated lactic acid level  Syncope: Possibly from dehydration and hypovolemia and orthostatic syncope.  Hydrate and repeat orthostatic in am. Orthostatics pending.  Telemetry monitoring. Echocardiogram and holding all diuretics.  Echocardiogram shows diastolic dysfunction and elevated filling pressures.  Orthostatics negative and she reports her symptoms have resolved.    Acute renal failure:  Possibly from ARB and diuretic and hypovolemia.  Hydrate and repeated renal parameters today show much improvement, .    US renal is negative  for hydronephrosis.  UA is negative for infection Urine for creatinine and sodium ordered and 449 and 22 respectively.   Hypothyroidism: tsh elevated on 4/11 , get free t4 and free t3.  and resume synthroid. Follow up with PCP .    Elevated lactic acid: possibly from dehydration , improved with hydration.   Type 2 DM:     Uncontrolled, increased lantus to 40 units and ssi to resistant scale.     Hypotension on admission from dehydration. Resolved.      Discharge Instructions  Discharge Instructions    Diet - low sodium heart healthy    Complete by:  As directed    Discharge instructions    Complete by:  As directed    Follow up with PCP in one week.     Allergies as of 12/14/2016      Reactions   Anesthetics, Ester Anaphylaxis      Medication List    STOP taking these medications   Insulin Glargine 100 UNIT/ML Solostar Pen Commonly known as:  LANTUS SOLOSTAR Replaced by:  insulin glargine 100 UNIT/ML injection   spironolactone 25 MG tablet Commonly known as:  ALDACTONE   SUMAtriptan 50 MG tablet Commonly known as:  IMITREX   valsartan 320 MG tablet Commonly known as:  DIOVAN     TAKE these medications   ACCU-CHEK AVIVA PLUS w/Device Kit ACHS for E11.9   accu-chek softclix lancets Check blood sugar ACHS for E11.9   ACCU-CHEK SOFTCLIX LANCETS lancets ACHS for E11.9   albuterol 108 (90 Base) MCG/ACT inhaler Commonly known as:  PROVENTIL HFA;VENTOLIN HFA Inhale 1-2 puffs into the lungs every 6 (six) hours as needed for wheezing or shortness of breath.   amitriptyline 50 MG tablet Commonly known as:  ELAVIL Take 1.5 tablets (75 mg total) by mouth at bedtime.   amLODipine 2.5 MG tablet Commonly known as:  NORVASC Take 1 tablet (2.5 mg total) by mouth daily.   aspirin 325 MG tablet Take 1 tablet (325 mg total) by mouth daily.   atorvastatin 40  MG tablet Commonly known as:  LIPITOR Take 1 tablet (40 mg total) by mouth daily.   Cholecalciferol 2000 units Tabs Take 1 tablet (2,000 Units total) by mouth daily.   cyclobenzaprine 5 MG tablet Commonly known as:  FLEXERIL Take 1 tablet (5 mg total) by mouth 3 (three) times daily as needed for muscle spasms.   escitalopram 20 MG tablet Commonly known as:  LEXAPRO Take 1 tablet (20 mg total) by mouth daily.    Exenatide ER 2 MG/0.85ML Auij Commonly known as:  BYDUREON BCISE Inject 1 Act into the skin once a week.   gabapentin 100 MG capsule Commonly known as:  NEURONTIN Take 1 capsule (100 mg total) by mouth 2 (two) times daily.   glucose blood test strip Commonly known as:  ACCU-CHEK AVIVA Check blood sugar AC and qHS for E11.9   HYDROcodone-acetaminophen 5-325 MG tablet Commonly known as:  NORCO/VICODIN Take 1 tablet by mouth every 8 (eight) hours as needed for moderate pain.   insulin aspart 100 UNIT/ML FlexPen Commonly known as:  NOVOLOG FLEXPEN 8 units with breakfast, 4 with lunch, and 30 units with supper, and pen needles 4/day.   insulin glargine 100 UNIT/ML injection Commonly known as:  LANTUS Inject 0.4 mLs (40 Units total) into the skin at bedtime. Replaces:  Insulin Glargine 100 UNIT/ML Solostar Pen   INSULIN SYRINGE .5CC/28G 28G X 1/2" 0.5 ML Misc Give insulin once per night   lamoTRIgine 25 MG tablet Commonly known as:  LAMICTAL Take 1 tab daily for 1 week and than 2 tab daily What changed:  how much to take  how to take this  when to take this  additional instructions   levothyroxine 125 MCG tablet Commonly known as:  SYNTHROID, LEVOTHROID Take 1 tablet (125 mcg total) by mouth daily.   loratadine 10 MG tablet Commonly known as:  CLARITIN TAKE ONE TABLET BY MOUTH ONCE DAILY What changed:  See the new instructions.   omeprazole 40 MG capsule Commonly known as:  PRILOSEC TAKE ONE CAPSULE BY MOUTH ONCE DAILY      Follow-up Information    Scarlette Calico, MD. Schedule an appointment as soon as possible for a visit on 12/20/2016.   Specialty:  Internal Medicine Why:  Appointment with Dr. Ronnald Ramp is on 12/20/16 at 2pm Contact information: [address] [place] [address] [phone]          Allergies  Allergen Reactions  . Anesthetics, Ester Anaphylaxis    Consultations:  None.    Procedures/Studies: Ct Head Wo  Contrast  Result Date: 12/11/2016 CLINICAL DATA:  Passed out, dizziness and nausea. EXAM: CT HEAD WITHOUT CONTRAST TECHNIQUE: Contiguous axial images were obtained from the base of the skull through the vertex without intravenous contrast. COMPARISON:  06/12/2015 MRI and CT FINDINGS: Brain: Small focus of encephalomalacia along the posterior left parietal lobe consistent with remote left MCA distribution infarct. No acute intracranial hemorrhage, midline shift or edema. No large vascular territory infarction. No intra-axial mass nor extra-axial fluid. No ventriculomegaly. No effacement of the basal cisterns. Fourth ventricle is midline. No acute cervical abnormality. Vascular: No hyperdense vessel or unexpected calcification. Skull: Normal. Negative for fracture or focal lesion. Sinuses/Orbits: No acute finding. Small mucous retention cyst in the sphenoid. Other: None. IMPRESSION: 1. Small focus of encephalomalacia in the high left posterior parietal lobe from remote MCA distribution infarct. 2. No acute intracranial abnormality. Electronically Signed   By: Ashley Royalty M.D.   On: 12/11/2016 18:05  US Renal  Result Date: 12/13/2016 CLINICAL DATA:  Hydronephrosis. EXAM: RENAL / URINARY TRACT ULTRASOUND COMPLETE COMPARISON:  CT 08/17/2009. FINDINGS: Right Kidney: Length: 10.3 cm. Echogenicity within normal limits. No mass or hydronephrosis visualized. Left Kidney: Length: 10.2 cm. Echogenicity within normal limits. Dromedary hump noted. No mass or hydronephrosis visualized. Bladder: Appears normal for degree of bladder distention. Bilateral ureteral jets noted. IMPRESSION: Negative exam.  No evidence of hydronephrosis. Electronically Signed   By: Marcello Moores  Register   On: 12/13/2016 06:58   Dg Chest Port 1 View  Result Date: 12/11/2016 CLINICAL DATA:  Sepsis EXAM: PORTABLE CHEST 1 VIEW COMPARISON:  06/12/2015 FINDINGS: The heart size and mediastinal contours are within normal limits. Both lungs are clear. The  visualized skeletal structures are unremarkable. IMPRESSION: No active disease. Electronically Signed   By: Inez Catalina M.D.   On: 12/11/2016 19:28       Subjective: Non complaints. No dizziness or chest pain or sob.   Discharge Exam: Vitals:   12/14/16 0402 12/14/16 0800  BP: (!) 156/58 (!) 156/65  Pulse: 97 95  Resp: 18 16  Temp: 99 F (37.2 C) 98.8 F (37.1 C)   Vitals:   12/13/16 2125 12/14/16 0016 12/14/16 0402 12/14/16 0800  BP: (!) 152/48 (!) 138/47 (!) 156/58 (!) 156/65  Pulse: 97 94 97 95  Resp: '18 18 18 16  '$ Temp: 99.1 F (37.3 C) 98 F (36.7 C) 99 F (37.2 C) 98.8 F (37.1 C)  TempSrc: Oral Oral Oral Oral  SpO2: 100% 100% 95% 97%  Weight:   71 kg (156 lb 8 oz)   Height:        General: Pt is alert, awake, not in acute distress Cardiovascular: RRR, S1/S2 +, no rubs, no gallops Respiratory: CTA bilaterally, no wheezing, no rhonchi Abdominal: Soft, NT, ND, bowel sounds + Extremities: no edema, no cyanosis    The results of significant diagnostics from this hospitalization (including imaging, microbiology, ancillary and laboratory) are listed below for reference.     Microbiology: Recent Results (from the past 240 hour(s))  Culture, blood (routine x 2)     Status: None (Preliminary result)   Collection Time: 12/11/16  9:15 PM  Result Value Ref Range Status   Specimen Description BLOOD LEFT ARM  Final   Special Requests   Final    BOTTLES DRAWN AEROBIC ONLY Blood Culture adequate volume   Culture NO GROWTH 3 DAYS  Final   Report Status PENDING  Incomplete  Culture, blood (routine x 2)     Status: None (Preliminary result)   Collection Time: 12/11/16  9:20 PM  Result Value Ref Range Status   Specimen Description BLOOD LEFT HAND  Final   Special Requests   Final    BOTTLES DRAWN AEROBIC ONLY Blood Culture adequate volume   Culture NO GROWTH 3 DAYS  Final   Report Status PENDING  Incomplete  MRSA PCR Screening     Status: None   Collection Time:  12/12/16 12:25 AM  Result Value Ref Range Status   MRSA by PCR NEGATIVE NEGATIVE Final    Comment:        The GeneXpert MRSA Assay (FDA approved for NASAL specimens only), is one component of a comprehensive MRSA colonization surveillance program. It is not intended to diagnose MRSA infection nor to guide or monitor treatment for MRSA infections.      Labs: BNP (last 3 results)  Recent Labs  03/19/16 1215  BNP 287.8*   Basic Metabolic  Panel:  Recent Labs Lab 12/11/16 1722 12/12/16 0536 12/13/16 0952 12/14/16 0345  NA 133* 136 134* 138  K 3.9 4.2 4.2 4.2  CL 94* 99* 105 110  CO2 '24 25 22 22  '$ GLUCOSE 190* 201* 381* 312*  BUN 28* 32* 23* 15  CREATININE 2.09* 2.17* 1.22* 1.03*  CALCIUM 9.8 8.7* 8.1* 8.1*   Liver Function Tests:  Recent Labs Lab 12/11/16 1722  AST 16  ALT 16  ALKPHOS 141*  BILITOT 0.6  PROT 7.7  ALBUMIN 4.4    Recent Labs Lab 12/11/16 1722  LIPASE 29   No results for input(s): AMMONIA in the last 168 hours. CBC:  Recent Labs Lab 12/11/16 1722 12/12/16 0536  WBC 15.2* 8.7  NEUTROABS 10.8*  --   HGB 14.8 12.5  HCT 44.6 39.3  MCV 86.6 88.1  PLT 175 161   Cardiac Enzymes: No results for input(s): CKTOTAL, CKMB, CKMBINDEX, TROPONINI in the last 168 hours. BNP: Invalid input(s): POCBNP CBG:  Recent Labs Lab 12/13/16 0747 12/13/16 1156 12/13/16 1654 12/13/16 2141 12/14/16 0742  GLUCAP 332* 463* 149* 212* 229*   D-Dimer No results for input(s): DDIMER in the last 72 hours. Hgb A1c No results for input(s): HGBA1C in the last 72 hours. Lipid Profile No results for input(s): CHOL, HDL, LDLCALC, TRIG, CHOLHDL, LDLDIRECT in the last 72 hours. Thyroid function studies  Recent Labs  12/13/16 1547  T3FREE 3.0   Anemia work up No results for input(s): VITAMINB12, FOLATE, FERRITIN, TIBC, IRON, RETICCTPCT in the last 72 hours. Urinalysis    Component Value Date/Time   COLORURINE YELLOW 12/11/2016 1658   APPEARANCEUR  HAZY (A) 12/11/2016 1658   LABSPEC 1.025 12/11/2016 1658   PHURINE 5.0 12/11/2016 1658   GLUCOSEU >=500 (A) 12/11/2016 1658   HGBUR NEGATIVE 12/11/2016 1658   BILIRUBINUR NEGATIVE 12/11/2016 1658   KETONESUR NEGATIVE 12/11/2016 1658   PROTEINUR NEGATIVE 12/11/2016 1658   UROBILINOGEN 0.2 06/12/2015 1417   NITRITE NEGATIVE 12/11/2016 1658   LEUKOCYTESUR TRACE (A) 12/11/2016 1658   Sepsis Labs Invalid input(s): PROCALCITONIN,  WBC,  LACTICIDVEN Microbiology Recent Results (from the past 240 hour(s))  Culture, blood (routine x 2)     Status: None (Preliminary result)   Collection Time: 12/11/16  9:15 PM  Result Value Ref Range Status   Specimen Description BLOOD LEFT ARM  Final   Special Requests   Final    BOTTLES DRAWN AEROBIC ONLY Blood Culture adequate volume   Culture NO GROWTH 3 DAYS  Final   Report Status PENDING  Incomplete  Culture, blood (routine x 2)     Status: None (Preliminary result)   Collection Time: 12/11/16  9:20 PM  Result Value Ref Range Status   Specimen Description BLOOD LEFT HAND  Final   Special Requests   Final    BOTTLES DRAWN AEROBIC ONLY Blood Culture adequate volume   Culture NO GROWTH 3 DAYS  Final   Report Status PENDING  Incomplete  MRSA PCR Screening     Status: None   Collection Time: 12/12/16 12:25 AM  Result Value Ref Range Status   MRSA by PCR NEGATIVE NEGATIVE Final    Comment:        The GeneXpert MRSA Assay (FDA approved for NASAL specimens only), is one component of a comprehensive MRSA colonization surveillance program. It is not intended to diagnose MRSA infection nor to guide or monitor treatment for MRSA infections.      Time coordinating discharge: Over 30 minutes  SIGNEDHosie Poisson, MD  [facility] 12/14/2016, 11:28 AM Pager   If 7PM-7AM, please contact night-coverage [email or web address] Password [email or web address]

## 2016-12-14 NOTE — Care Management Note (Addendum)
Case Management Note  Patient Details  Name: Sonia Skinner MRN: [id number] Date of Birth: 1951-02-16  Subjective/Objective:            Admitted with syncope, hx of  hypertension, insulin-dependent diabetes mellitus, depression, carotid artery occlusion with CVA status post CEA, and hypothyroidism.   Lives with sister.  Kenna Gilbert (Sister) Tiburcio Pea (Sister)     [phone]     PCP: Scarlette Calico  Action/Plan: Plan is to d/c to home today. No needs identified per cm.  Expected Discharge Date:  12/14/16               Expected Discharge Plan:  Home/Self Care  In-House Referral:     Discharge planning Services  CM Consult  Post Acute Care Choice:    Choice offered to:     DME Arranged:   N/A DME Agency:   N/A  HH Arranged:   N/A  HH Agency:   N/A  Status of Service:  Completed, signed off  If discussed at [facility] Meetings, dates discussed:    Additional Comments:  Sharin Mons, RN 12/14/2016, 10:58 AM

## 2016-12-16 LAB — CULTURE, BLOOD (ROUTINE X 2)
Culture: NO GROWTH
Culture: NO GROWTH
Special Requests: ADEQUATE
Special Requests: ADEQUATE

## 2016-12-20 ENCOUNTER — Encounter: Payer: Self-pay | Admitting: Internal Medicine

## 2016-12-20 ENCOUNTER — Ambulatory Visit (INDEPENDENT_AMBULATORY_CARE_PROVIDER_SITE_OTHER): Payer: Medicare Other | Admitting: Internal Medicine

## 2016-12-20 ENCOUNTER — Other Ambulatory Visit (INDEPENDENT_AMBULATORY_CARE_PROVIDER_SITE_OTHER): Payer: Medicare Other

## 2016-12-20 VITALS — BP 170/70 | HR 104 | Temp 97.9°F | Resp 16 | Ht 62.0 in | Wt 153.5 lb

## 2016-12-20 DIAGNOSIS — Z794 Long term (current) use of insulin: Secondary | ICD-10-CM | POA: Diagnosis not present

## 2016-12-20 DIAGNOSIS — E11311 Type 2 diabetes mellitus with unspecified diabetic retinopathy with macular edema: Secondary | ICD-10-CM

## 2016-12-20 DIAGNOSIS — E038 Other specified hypothyroidism: Secondary | ICD-10-CM | POA: Diagnosis not present

## 2016-12-20 DIAGNOSIS — R7989 Other specified abnormal findings of blood chemistry: Secondary | ICD-10-CM | POA: Diagnosis not present

## 2016-12-20 DIAGNOSIS — R945 Abnormal results of liver function studies: Secondary | ICD-10-CM

## 2016-12-20 DIAGNOSIS — Z23 Encounter for immunization: Secondary | ICD-10-CM | POA: Diagnosis not present

## 2016-12-20 DIAGNOSIS — I10 Essential (primary) hypertension: Secondary | ICD-10-CM

## 2016-12-20 DIAGNOSIS — E1141 Type 2 diabetes mellitus with diabetic mononeuropathy: Secondary | ICD-10-CM

## 2016-12-20 LAB — URINALYSIS, ROUTINE W REFLEX MICROSCOPIC
Bilirubin Urine: NEGATIVE
Hgb urine dipstick: NEGATIVE
Ketones, ur: NEGATIVE
Leukocytes, UA: NEGATIVE
Nitrite: NEGATIVE
RBC / HPF: NONE SEEN (ref 0–?)
Specific Gravity, Urine: <=1.005 — AB (ref 1.000–1.030)
Total Protein, Urine: NEGATIVE
Urine Glucose: >=1000 — AB
Urobilinogen, UA: 0.2 (ref 0.0–1.0)
WBC, UA: NONE SEEN (ref 0–?)
pH: 6 (ref 5.0–8.0)

## 2016-12-20 LAB — BASIC METABOLIC PANEL
BUN: 13 mg/dL (ref 6–23)
CO2: 29 mEq/L (ref 19–32)
Calcium: 9.6 mg/dL (ref 8.4–10.5)
Chloride: 100 mEq/L (ref 96–112)
Creatinine, Ser: 1.08 mg/dL (ref 0.40–1.20)
GFR: 65 mL/min (ref >60.00)
Glucose, Bld: 348 mg/dL — ABNORMAL HIGH (ref 70–99)
Potassium: 4.2 mEq/L (ref 3.5–5.1)
Sodium: 137 mEq/L (ref 135–145)

## 2016-12-20 LAB — MICROALBUMIN / CREATININE URINE RATIO
Creatinine,U: 35.7 mg/dL
Microalb Creat Ratio: 2 mg/g (ref 0.0–30.0)
Microalb, Ur: <0.7 mg/dL (ref 0.0–1.9)

## 2016-12-20 LAB — TSH: TSH: 2.09 u[IU]/mL (ref 0.35–4.50)

## 2016-12-20 MED ORDER — ACCU-CHEK SOFTCLIX LANCETS MISC: 12 refills | Status: DC

## 2016-12-20 MED ORDER — NEBIVOLOL HCL 10 MG PO TABS: 10.0000 mg | ORAL_TABLET | Freq: Every day | ORAL | 0 refills | Status: DC

## 2016-12-20 MED ORDER — ACCU-CHEK GUIDE W/DEVICE KIT: 1.0000 | PACK | Freq: Three times a day (TID) | 0 refills | Status: DC

## 2016-12-20 MED ORDER — ACCU-CHEK SOFTCLIX LANCET DEV MISC: 11 refills | Status: DC

## 2016-12-20 NOTE — Progress Notes (Signed)
 Subjective:  Patient ID: Sonia Skinner, female    DOB: 05/21/1951  Age: 66 y.o. MRN: [id number]  CC: Hypertension and Diabetes   HPI Takyla G Gashi presents for f/up after a recent admission for dehydration/hypotension/AKI. She has improved significantly but is concerned that her blood pressure is not well controlled and she has had a few headaches. Her oral intake has been excellent. She wants to change Bydureon to Byetta because she thinks it works better for her.  Outpatient Medications Prior to Visit  Medication Sig Dispense Refill  . albuterol (PROVENTIL HFA;VENTOLIN HFA) 108 (90 Base) MCG/ACT inhaler Inhale 1-2 puffs into the lungs every 6 (six) hours as needed for wheezing or shortness of breath.    . amitriptyline (ELAVIL) 50 MG tablet Take 1.5 tablets (75 mg total) by mouth at bedtime. 45 tablet 1  . amLODipine (NORVASC) 2.5 MG tablet Take 1 tablet (2.5 mg total) by mouth daily. 30 tablet 0  . aspirin 325 MG tablet Take 1 tablet (325 mg total) by mouth daily.    . atorvastatin (LIPITOR) 40 MG tablet Take 1 tablet (40 mg total) by mouth daily. 90 tablet 1  . Cholecalciferol 2000 units TABS Take 1 tablet (2,000 Units total) by mouth daily. 90 tablet 3  . cyclobenzaprine (FLEXERIL) 5 MG tablet Take 1 tablet (5 mg total) by mouth 3 (three) times daily as needed for muscle spasms. 10 tablet 0  . escitalopram (LEXAPRO) 20 MG tablet Take 1 tablet (20 mg total) by mouth daily. 30 tablet 1  . gabapentin (NEURONTIN) 100 MG capsule Take 1 capsule (100 mg total) by mouth 2 (two) times daily. 60 capsule 0  . glucose blood (ACCU-CHEK AVIVA) test strip Check blood sugar AC and qHS for E11.9 100 each 12  . HYDROcodone-acetaminophen (NORCO/VICODIN) 5-325 MG tablet Take 1 tablet by mouth every 8 (eight) hours as needed for moderate pain. 10 tablet 0  . insulin aspart (NOVOLOG FLEXPEN) 100 UNIT/ML FlexPen 8 units with breakfast, 4 with lunch, and 30 units with supper, and pen needles 4/day. 15 mL 11  .  insulin glargine (LANTUS) 100 UNIT/ML injection Inject 0.4 mLs (40 Units total) into the skin at bedtime. 10 mL 11  . INSULIN SYRINGE .5CC/28G 28G X 1/2" 0.5 ML MISC Give insulin once per night 100 each 12  . lamoTRIgine (LAMICTAL) 25 MG tablet Take 1 tab daily for 1 week and than 2 tab daily (Patient taking differently: Take 50 mg by mouth daily. ) 60 tablet 1  . levothyroxine (SYNTHROID, LEVOTHROID) 125 MCG tablet Take 1 tablet (125 mcg total) by mouth daily. 90 tablet 1  . loratadine (CLARITIN) 10 MG tablet TAKE ONE TABLET BY MOUTH ONCE DAILY (Patient taking differently: TAKE ONE TABLET BY MOUTH ONCE DAILY AS NEEDED FOR ALLERGIES) 30 tablet 2  . omeprazole (PRILOSEC) 40 MG capsule TAKE ONE CAPSULE BY MOUTH ONCE DAILY 30 capsule 2  . ACCU-CHEK SOFTCLIX LANCETS lancets ACHS for E11.9 100 each 12  . Blood Glucose Monitoring Suppl (ACCU-CHEK AVIVA PLUS) w/Device KIT ACHS for E11.9 1 kit 0  . Exenatide ER (BYDUREON BCISE) 2 MG/0.85ML AUIJ Inject 1 Act into the skin once a week. 12 pen 3  . Lancet Devices (ACCU-CHEK SOFTCLIX) lancets Check blood sugar ACHS for E11.9 1 each 11   No facility-administered medications prior to visit.     ROS Review of Systems  Constitutional: Negative for appetite change, chills, diaphoresis, fatigue and fever.  HENT: Negative.   Eyes: Negative for   visual disturbance.  Respiratory: Negative for cough, chest tightness, shortness of breath and wheezing.   Cardiovascular: Negative for chest pain, palpitations and leg swelling.  Gastrointestinal: Negative for abdominal pain, constipation, diarrhea, nausea and vomiting.  Endocrine: Negative for cold intolerance, heat intolerance, polydipsia, polyphagia and polyuria.  Genitourinary: Negative.  Negative for decreased urine volume, difficulty urinating, dysuria, frequency and urgency.  Musculoskeletal: Negative.  Negative for back pain and neck pain.  Skin: Negative.   Allergic/Immunologic: Negative.   Neurological:  Negative.  Negative for dizziness, weakness and light-headedness.  Hematological: Negative for adenopathy. Does not bruise/bleed easily.  Psychiatric/Behavioral: Negative.     Objective:  BP (!) 170/70 (BP Location: Left Arm, Patient Position: Sitting, Cuff Size: Large)   Pulse (!) 104   Temp 97.9 F (36.6 C) (Oral)   Resp 16   Ht 5' 2" (1.575 m)   Wt 153 lb 8 oz (69.6 kg)   SpO2 98%   BMI 28.08 kg/m   BP Readings from Last 3 Encounters:  12/20/16 (!) 170/70  12/14/16 (!) 178/48  11/24/16 (!) 160/82    Wt Readings from Last 3 Encounters:  12/20/16 153 lb 8 oz (69.6 kg)  12/14/16 156 lb 8 oz (71 kg)  11/24/16 159 lb (72.1 kg)    Physical Exam  Constitutional: She is oriented to person, place, and time. No distress.  HENT:  Mouth/Throat: Oropharynx is clear and moist. No oropharyngeal exudate.  Eyes: Conjunctivae are normal. Right eye exhibits no discharge. Left eye exhibits no discharge. No scleral icterus.  Neck: Normal range of motion. Neck supple. No JVD present. No tracheal deviation present. No thyromegaly present.  Cardiovascular: Normal rate, regular rhythm, normal heart sounds and intact distal pulses.  Exam reveals no gallop and no friction rub.   No murmur heard. Pulmonary/Chest: Effort normal and breath sounds normal. No respiratory distress. She has no wheezes. She has no rales. She exhibits no tenderness.  Abdominal: Soft. Bowel sounds are normal. She exhibits no distension and no mass. There is no tenderness. There is no rebound and no guarding.  Musculoskeletal: Normal range of motion. She exhibits no edema, tenderness or deformity.  Lymphadenopathy:    She has no cervical adenopathy.  Neurological: She is oriented to person, place, and time.  Skin: Skin is warm and dry. No rash noted. She is not diaphoretic. No erythema. No pallor.  Vitals reviewed.   Lab Results  Component Value Date   WBC 8.7 12/12/2016   HGB 12.5 12/12/2016   HCT 39.3 12/12/2016    PLT 161 12/12/2016   GLUCOSE 348 (H) 12/20/2016   CHOL 173 11/24/2016   TRIG 192.0 (H) 11/24/2016   HDL 51.60 11/24/2016   LDLDIRECT 141 (H) 03/04/2010   LDLCALC 83 11/24/2016   ALT 16 12/11/2016   AST 16 12/11/2016   NA 137 12/20/2016   K 4.2 12/20/2016   CL 100 12/20/2016   CREATININE 1.08 12/20/2016   BUN 13 12/20/2016   CO2 29 12/20/2016   TSH 2.09 12/20/2016   INR 1.10 06/12/2015   HGBA1C 10.3 09/07/2016   MICROALBUR <0.7 12/20/2016    Ct Head Wo Contrast  Result Date: 12/11/2016 CLINICAL DATA:  Passed out, dizziness and nausea. EXAM: CT HEAD WITHOUT CONTRAST TECHNIQUE: Contiguous axial images were obtained from the base of the skull through the vertex without intravenous contrast. COMPARISON:  06/12/2015 MRI and CT FINDINGS: Brain: Small focus of encephalomalacia along the posterior left parietal lobe consistent with remote left MCA distribution infarct. No  acute intracranial hemorrhage, midline shift or edema. No large vascular territory infarction. No intra-axial mass nor extra-axial fluid. No ventriculomegaly. No effacement of the basal cisterns. Fourth ventricle is midline. No acute cervical abnormality. Vascular: No hyperdense vessel or unexpected calcification. Skull: Normal. Negative for fracture or focal lesion. Sinuses/Orbits: No acute finding. Small mucous retention cyst in the sphenoid. Other: None. IMPRESSION: 1. Small focus of encephalomalacia in the high left posterior parietal lobe from remote MCA distribution infarct. 2. No acute intracranial abnormality. Electronically Signed   By: David  Kwon M.D.   On: 12/11/2016 18:05   Dg Chest Port 1 View  Result Date: 12/11/2016 CLINICAL DATA:  Sepsis EXAM: PORTABLE CHEST 1 VIEW COMPARISON:  06/12/2015 FINDINGS: The heart size and mediastinal contours are within normal limits. Both lungs are clear. The visualized skeletal structures are unremarkable. IMPRESSION: No active disease. Electronically Signed   By: Mark  Lukens M.D.    On: 12/11/2016 19:28    Assessment & Plan:   Blu was seen today for hypertension and diabetes.  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 -     Basic metabolic panel; Future -     Microalbumin / creatinine urine ratio; Future -     Ambulatory referral to Ophthalmology -     ACCU-CHEK SOFTCLIX LANCETS lancets; ACHS for E11.9 -     Lancet Devices (ACCU-CHEK SOFTCLIX) lancets; Check blood sugar ACHS for E11.9 -     Blood Glucose Monitoring Suppl (ACCU-CHEK GUIDE) w/Device KIT; 1 Act by Does not apply route 4 (four) times daily - after meals and at bedtime. -     exenatide (BYETTA 5 MCG PEN) 5 MCG/0.02ML SOPN injection; Inject 0.02 mLs (5 mcg total) into the skin 2 (two) times daily with a meal.  Essential hypertension, benign- her blood pressure is not well controlled so will add nebivolol, her renal function has improved but since she was recently dehydrated will avoid diuretics at this time. -     nebivolol (BYSTOLIC) 10 MG tablet; Take 1 tablet (10 mg total) by mouth daily. -     Basic metabolic panel; Future -     Urinalysis, Routine w reflex microscopic; Future  LFT elevation- her LFTs are normal now and her screening for hepatitis C is negative. -     Hepatitis C antibody; Future  Need for vaccination against Streptococcus pneumoniae using pneumococcal conjugate vaccine 13 -     Pneumococcal conjugate vaccine 13-valent  Type 2 diabetes mellitus with diabetic mononeuropathy, with long-term current use of insulin (HCC) -      ACCU-CHEK SOFTCLIX LANCETS lancets; ACHS for E11.9 -     Lancet Devices (ACCU-CHEK SOFTCLIX) lancets; Check blood sugar ACHS for E11.9 -     Blood Glucose Monitoring Suppl (ACCU-CHEK GUIDE) w/Device KIT; 1 Act by Does not apply route 4 (four) times daily - after meals and at bedtime. -     exenatide (BYETTA 5 MCG PEN) 5 MCG/0.02ML SOPN injection; Inject 0.02 mLs (5 mcg total) into the skin 2 (two) times daily with   a meal.   I have discontinued Ms. Bensen's ACCU-CHEK AVIVA PLUS and Exenatide ER. I am also having her start on nebivolol, ACCU-CHEK GUIDE, and exenatide. Additionally, I am having her maintain her aspirin, INSULIN SYRINGE .5CC/28G, loratadine, albuterol, glucose blood, insulin aspart, omeprazole, atorvastatin, Cholecalciferol, levothyroxine, lamoTRIgine, escitalopram, amitriptyline, amLODipine, gabapentin, insulin glargine, cyclobenzaprine, HYDROcodone-acetaminophen, ACCU-CHEK SOFTCLIX LANCETS, and accu-chek softclix.  Meds ordered this encounter  Medications  . nebivolol (BYSTOLIC) 10 MG tablet    Sig: Take 1 tablet (10 mg total) by mouth daily.    Dispense:  49 tablet    Refill:  0  . ACCU-CHEK SOFTCLIX LANCETS lancets    Sig: ACHS for E11.9    Dispense:  100 each    Refill:  12  . Lancet Devices (ACCU-CHEK SOFTCLIX) lancets    Sig: Check blood sugar ACHS for E11.9    Dispense:  1 each    Refill:  11  . Blood Glucose Monitoring Suppl (ACCU-CHEK GUIDE) w/Device KIT    Sig: 1 Act by Does not apply route 4 (four) times daily - after meals and at bedtime.    Dispense:  2 kit    Refill:  0  . exenatide (BYETTA 5 MCG PEN) 5 MCG/0.02ML SOPN injection    Sig: Inject 0.02 mLs (5 mcg total) into the skin 2 (two) times daily with a meal.    Dispense:  1.2 mL    Refill:  11     Follow-up: Return in about 6 weeks (around 01/31/2017).   , MD 

## 2016-12-20 NOTE — Progress Notes (Signed)
Pre visit review using our clinic review tool, if applicable. No additional management support is needed unless otherwise documented below in the visit note. 

## 2016-12-20 NOTE — Patient Instructions (Signed)

## 2016-12-21 ENCOUNTER — Other Ambulatory Visit: Payer: Self-pay | Admitting: Internal Medicine

## 2016-12-21 ENCOUNTER — Encounter: Payer: Self-pay | Admitting: Internal Medicine

## 2016-12-21 DIAGNOSIS — E1141 Type 2 diabetes mellitus with diabetic mononeuropathy: Secondary | ICD-10-CM

## 2016-12-21 DIAGNOSIS — E11311 Type 2 diabetes mellitus with unspecified diabetic retinopathy with macular edema: Secondary | ICD-10-CM

## 2016-12-21 DIAGNOSIS — Z794 Long term (current) use of insulin: Secondary | ICD-10-CM

## 2016-12-21 LAB — HEPATITIS C ANTIBODY: HCV Ab: NEGATIVE

## 2016-12-21 MED ORDER — EXENATIDE 5 MCG/0.02ML ~~LOC~~ SOPN: 5.0000 ug | PEN_INJECTOR | Freq: Two times a day (BID) | SUBCUTANEOUS | 11 refills | Status: DC

## 2016-12-21 MED ORDER — GLUCOSE BLOOD VI STRP: 1.0000 | ORAL_STRIP | Freq: Two times a day (BID) | 5 refills | Status: DC

## 2016-12-24 ENCOUNTER — Other Ambulatory Visit (HOSPITAL_COMMUNITY): Payer: Self-pay | Admitting: Psychiatry

## 2016-12-24 DIAGNOSIS — F331 Major depressive disorder, recurrent, moderate: Secondary | ICD-10-CM

## 2017-01-15 ENCOUNTER — Other Ambulatory Visit: Payer: Self-pay | Admitting: Internal Medicine

## 2017-01-15 DIAGNOSIS — Z794 Long term (current) use of insulin: Principal | ICD-10-CM

## 2017-01-15 DIAGNOSIS — E1122 Type 2 diabetes mellitus with diabetic chronic kidney disease: Secondary | ICD-10-CM

## 2017-01-15 DIAGNOSIS — N181 Chronic kidney disease, stage 1: Principal | ICD-10-CM

## 2017-01-15 MED ORDER — ACCU-CHEK FASTCLIX LANCETS MISC: 1.0000 | Freq: Four times a day (QID) | 11 refills | Status: DC

## 2017-01-15 MED ORDER — ACCU-CHEK FASTCLIX LANCET KIT: 1.0000 | PACK | Freq: Four times a day (QID) | 1 refills | Status: DC

## 2017-01-17 ENCOUNTER — Other Ambulatory Visit: Payer: Self-pay | Admitting: Endocrinology

## 2017-01-19 ENCOUNTER — Telehealth: Payer: Self-pay

## 2017-01-19 MED ORDER — LANCETS THIN MISC
3 refills | Status: DC
Start: 2017-01-19 — End: 2017-09-16

## 2017-01-19 NOTE — Telephone Encounter (Signed)
[facility] sent rx request for lancets.  Rx sent per rq.

## 2017-01-20 ENCOUNTER — Other Ambulatory Visit: Payer: Self-pay

## 2017-01-20 ENCOUNTER — Other Ambulatory Visit: Payer: Self-pay | Admitting: Internal Medicine

## 2017-01-20 DIAGNOSIS — E038 Other specified hypothyroidism: Secondary | ICD-10-CM

## 2017-01-20 MED ORDER — LEVOTHYROXINE SODIUM 125 MCG PO TABS: 125.0000 ug | ORAL_TABLET | Freq: Every day | ORAL | 1 refills | Status: DC

## 2017-01-28 ENCOUNTER — Other Ambulatory Visit: Payer: Self-pay | Admitting: Internal Medicine

## 2017-01-28 DIAGNOSIS — I10 Essential (primary) hypertension: Secondary | ICD-10-CM

## 2017-01-31 ENCOUNTER — Ambulatory Visit (INDEPENDENT_AMBULATORY_CARE_PROVIDER_SITE_OTHER): Payer: Medicare Other | Admitting: Internal Medicine

## 2017-01-31 ENCOUNTER — Encounter: Payer: Self-pay | Admitting: Internal Medicine

## 2017-01-31 VITALS — BP 142/74 | HR 78 | Temp 98.9°F | Resp 16 | Ht 62.0 in | Wt 162.0 lb

## 2017-01-31 DIAGNOSIS — Z794 Long term (current) use of insulin: Secondary | ICD-10-CM | POA: Diagnosis not present

## 2017-01-31 DIAGNOSIS — I10 Essential (primary) hypertension: Secondary | ICD-10-CM

## 2017-01-31 DIAGNOSIS — E113519 Type 2 diabetes mellitus with proliferative diabetic retinopathy with macular edema, unspecified eye: Secondary | ICD-10-CM | POA: Diagnosis not present

## 2017-01-31 LAB — POCT GLUCOSE (DEVICE FOR HOME USE): Glucose Fasting, POC: 163 mg/dL — AB (ref 70–99)

## 2017-01-31 LAB — POCT GLYCOSYLATED HEMOGLOBIN (HGB A1C): Hemoglobin A1C: 11.7

## 2017-01-31 MED ORDER — EMPAGLIFLOZIN-METFORMIN HCL 5-1000 MG PO TABS: 1.0000 | ORAL_TABLET | Freq: Two times a day (BID) | ORAL | 1 refills | Status: DC

## 2017-01-31 MED ORDER — AMLODIPINE BESYLATE 2.5 MG PO TABS: 2.5000 mg | ORAL_TABLET | Freq: Every day | ORAL | 1 refills | Status: DC

## 2017-01-31 MED ORDER — INSULIN GLARGINE 100 UNIT/ML ~~LOC~~ SOLN: 60.0000 [IU] | Freq: Every day | SUBCUTANEOUS | 11 refills | Status: DC

## 2017-01-31 MED ORDER — NEBIVOLOL HCL 10 MG PO TABS: 10.0000 mg | ORAL_TABLET | Freq: Every day | ORAL | 1 refills | Status: DC

## 2017-01-31 MED ORDER — NEBIVOLOL HCL 10 MG PO TABS: 10.0000 mg | ORAL_TABLET | Freq: Every day | ORAL | 5 refills | Status: DC

## 2017-01-31 MED ORDER — EXENATIDE 10 MCG/0.04ML ~~LOC~~ SOPN: 10.0000 ug | PEN_INJECTOR | Freq: Two times a day (BID) | SUBCUTANEOUS | 1 refills | Status: DC

## 2017-01-31 NOTE — Patient Instructions (Signed)

## 2017-01-31 NOTE — Progress Notes (Signed)
Subjective:  Patient ID: Sonia Skinner, female    DOB: Dec 31, 1950  Age: 66 y.o. MRN: [id number]  CC: Diabetes and Hypertension   HPI Sonia Skinner presents for f/up - She complains that her blood sugars are not well-controlled, she is not adhering to her diet and is gained weight. She wants to increase the dose of Byetta. She is waking up at 2 in the morning and eating foods like steak and potatoes.  Outpatient Medications Prior to Visit  Medication Sig Dispense Refill  . albuterol (PROVENTIL HFA;VENTOLIN HFA) 108 (90 Base) MCG/ACT inhaler Inhale 1-2 puffs into the lungs every 6 (six) hours as needed for wheezing or shortness of breath.    Marland Kitchen aspirin 325 MG tablet Take 1 tablet (325 mg total) by mouth daily.    Marland Kitchen atorvastatin (LIPITOR) 40 MG tablet Take 1 tablet (40 mg total) by mouth daily. 90 tablet 1  . Blood Glucose Monitoring Suppl (ACCU-CHEK GUIDE) w/Device KIT 1 Act by Does not apply route 4 (four) times daily - after meals and at bedtime. 2 kit 0  . Cholecalciferol 2000 units TABS Take 1 tablet (2,000 Units total) by mouth daily. 90 tablet 3  . gabapentin (NEURONTIN) 100 MG capsule Take 1 capsule (100 mg total) by mouth 2 (two) times daily. 60 capsule 0  . glucose blood (ACCU-CHEK GUIDE) test strip 1 each by Other route 2 (two) times daily. Use to check blood sugars twice a day 100 each 5  . HYDROcodone-acetaminophen (NORCO/VICODIN) 5-325 MG tablet Take 1 tablet by mouth every 8 (eight) hours as needed for moderate pain. 10 tablet 0  . insulin aspart (NOVOLOG FLEXPEN) 100 UNIT/ML FlexPen 8 units with breakfast, 4 with lunch, and 30 units with supper, and pen needles 4/day. 15 mL 11  . INSULIN SYRINGE .5CC/28G 28G X 1/2" 0.5 ML MISC Give insulin once per night 100 each 12  . Lancets Misc. (ACCU-CHEK FASTCLIX LANCET) KIT 1 Act by Does not apply route 4 (four) times daily. 1 kit 1  . Lancets Thin MISC Use to check blood sugar 4 times per day. 400 each 3  . levothyroxine (SYNTHROID,  LEVOTHROID) 125 MCG tablet Take 1 tablet (125 mcg total) by mouth daily. 90 tablet 1  . omeprazole (PRILOSEC) 40 MG capsule TAKE 1 CAPSULE BY MOUTH ONCE DAILY 90 capsule 0  . amitriptyline (ELAVIL) 50 MG tablet Take 1.5 tablets (75 mg total) by mouth at bedtime. 45 tablet 1  . amitriptyline (ELAVIL) 50 MG tablet TAKE 1 & 1/2 (ONE & ONE-HALF) TABLETS BY MOUTH AT BEDTIME 45 tablet 1  . amLODipine (NORVASC) 2.5 MG tablet Take 1 tablet (2.5 mg total) by mouth daily. 30 tablet 0  . cyclobenzaprine (FLEXERIL) 5 MG tablet Take 1 tablet (5 mg total) by mouth 3 (three) times daily as needed for muscle spasms. 10 tablet 0  . escitalopram (LEXAPRO) 20 MG tablet Take 1 tablet (20 mg total) by mouth daily. 30 tablet 1  . exenatide (BYETTA 5 MCG PEN) 5 MCG/0.02ML SOPN injection Inject 0.02 mLs (5 mcg total) into the skin 2 (two) times daily with a meal. 1.2 mL 11  . insulin glargine (LANTUS) 100 UNIT/ML injection Inject 0.4 mLs (40 Units total) into the skin at bedtime. 10 mL 11  . lamoTRIgine (LAMICTAL) 25 MG tablet Take 1 tab daily for 1 week and than 2 tab daily (Patient taking differently: Take 50 mg by mouth daily. ) 60 tablet 1  . loratadine (CLARITIN) 10 MG  tablet TAKE ONE TABLET BY MOUTH ONCE DAILY (Patient taking differently: TAKE ONE TABLET BY MOUTH ONCE DAILY AS NEEDED FOR ALLERGIES) 30 tablet 2  . nebivolol (BYSTOLIC) 10 MG tablet Take 1 tablet (10 mg total) by mouth daily. 30 tablet 5   No facility-administered medications prior to visit.     ROS Review of Systems  Constitutional: Positive for unexpected weight change. Negative for activity change, appetite change, chills, diaphoresis and fatigue.  HENT: Negative.   Eyes: Negative.   Respiratory: Negative.  Negative for cough, chest tightness, shortness of breath and wheezing.   Cardiovascular: Negative for chest pain, palpitations and leg swelling.  Gastrointestinal: Negative for abdominal pain, constipation, diarrhea, nausea and vomiting.    Endocrine: Negative for polydipsia, polyphagia and polyuria.  Genitourinary: Negative.  Negative for difficulty urinating, dysuria, frequency and urgency.  Musculoskeletal: Negative.  Negative for back pain and myalgias.  Skin: Negative.   Allergic/Immunologic: Negative.   Neurological: Negative.  Negative for dizziness, weakness and light-headedness.  Hematological: Negative for adenopathy. Does not bruise/bleed easily.  Psychiatric/Behavioral: Negative.     Objective:  BP (!) 142/74 (BP Location: Left Arm, Patient Position: Sitting, Cuff Size: Large)   Pulse 78   Temp 98.9 F (37.2 C) (Oral)   Resp 16   Ht _0  (1.575 m)   Wt 162 lb (73.5 kg)   SpO2 98%   BMI 29.63 kg/m   BP Readings from Last 3 Encounters:  01/31/17 (!) 142/74  12/20/16 (!) 170/70  12/14/16 (!) 178/48    Wt Readings from Last 3 Encounters:  01/31/17 162 lb (73.5 kg)  12/20/16 153 lb 8 oz (69.6 kg)  12/14/16 156 lb 8 oz (71 kg)    Physical Exam  Constitutional: She is oriented to person, place, and time.  HENT:  Mouth/Throat: Oropharynx is clear and moist. No oropharyngeal exudate.  Eyes: Conjunctivae are normal. Right eye exhibits no discharge. Left eye exhibits no discharge. No scleral icterus.  Neck: Normal range of motion. Neck supple. No JVD present. No thyromegaly present.  Cardiovascular: Normal rate, regular rhythm and intact distal pulses.  Exam reveals no gallop.   No murmur heard. Pulmonary/Chest: Effort normal and breath sounds normal. No respiratory distress. She has no wheezes. She has no rales. She exhibits no tenderness.  Abdominal: Soft. Bowel sounds are normal. She exhibits no distension and no mass. There is no tenderness. There is no rebound and no guarding.  Musculoskeletal: Normal range of motion. She exhibits no edema, tenderness or deformity.  Lymphadenopathy:    She has no cervical adenopathy.  Neurological: She is alert and oriented to person, place, and time.  Skin: Skin  is warm and dry. No rash noted. She is not diaphoretic. No erythema. No pallor.  Vitals reviewed.   Lab Results  Component Value Date   WBC 8.7 12/12/2016   HGB 12.5 12/12/2016   HCT 39.3 12/12/2016   PLT 161 12/12/2016   GLUCOSE 348 (H) 12/20/2016   CHOL 173 11/24/2016   TRIG 192.0 (H) 11/24/2016   HDL 51.60 11/24/2016   LDLDIRECT 141 (H) 03/04/2010   LDLCALC 83 11/24/2016   ALT 16 12/11/2016   AST 16 12/11/2016   NA 137 12/20/2016   K 4.2 12/20/2016   CL 100 12/20/2016   CREATININE 1.08 12/20/2016   BUN 13 12/20/2016   CO2 29 12/20/2016   TSH 2.09 12/20/2016   INR 1.10 06/12/2015   HGBA1C 11.7 01/31/2017   MICROALBUR <0.7 12/20/2016    Ct  Head Wo Contrast  Result Date: 12/11/2016 CLINICAL DATA:  Passed out, dizziness and nausea. EXAM: CT HEAD WITHOUT CONTRAST TECHNIQUE: Contiguous axial images were obtained from the base of the skull through the vertex without intravenous contrast. COMPARISON:  06/12/2015 MRI and CT FINDINGS: Brain: Small focus of encephalomalacia along the posterior left parietal lobe consistent with remote left MCA distribution infarct. No acute intracranial hemorrhage, midline shift or edema. No large vascular territory infarction. No intra-axial mass nor extra-axial fluid. No ventriculomegaly. No effacement of the basal cisterns. Fourth ventricle is midline. No acute cervical abnormality. Vascular: No hyperdense vessel or unexpected calcification. Skull: Normal. Negative for fracture or focal lesion. Sinuses/Orbits: No acute finding. Small mucous retention cyst in the sphenoid. Other: None. IMPRESSION: 1. Small focus of encephalomalacia in the high left posterior parietal lobe from remote MCA distribution infarct. 2. No acute intracranial abnormality. Electronically Signed   By: Ashley Royalty M.D.   On: 12/11/2016 18:05   Dg Chest Port 1 View  Result Date: 12/11/2016 CLINICAL DATA:  Sepsis EXAM: PORTABLE CHEST 1 VIEW COMPARISON:  06/12/2015 FINDINGS: The heart  size and mediastinal contours are within normal limits. Both lungs are clear. The visualized skeletal structures are unremarkable. IMPRESSION: No active disease. Electronically Signed   By: Inez Catalina M.D.   On: 12/11/2016 19:28    Assessment & Plan:   Reshunda was seen today for diabetes and hypertension.  Diagnoses and all orders for this visit:  Type 2 diabetes mellitus with proliferative retinopathy and macular edema, with long-term current use of insulin, unspecified- her A1c is up to 11.7%, will increase the dose of Byetta. Will add on metformin and an SGLT2 inhibitor. Will increase the dose of Lantus to 60 units a day. laterality (HCC) -     exenatide (BYETTA 10 MCG PEN) 10 MCG/0.04ML SOPN injection; Inject 0.04 mLs (10 mcg total) into the skin 2 (two) times daily with a meal. -     POCT glycosylated hemoglobin (Hb A1C) -     POCT Glucose (Device for Home Use) -     insulin glargine (LANTUS) 100 UNIT/ML injection; Inject 0.6 mLs (60 Units total) into the skin at bedtime. -     Empagliflozin-Metformin HCl (SYNJARDY) 12-998 MG TABS; Take 1 tablet by mouth 2 (two) times daily.  Essential hypertension, benign- her blood pressure is adequately well controlled, will continue to combination of amlodipine and nebivolol. -     amLODipine (NORVASC) 2.5 MG tablet; Take 1 tablet (2.5 mg total) by mouth daily. -     nebivolol (BYSTOLIC) 10 MG tablet; Take 1 tablet (10 mg total) by mouth daily.   I have discontinued Ms. Dorko's amitriptyline, cyclobenzaprine, and exenatide. I have also changed her insulin glargine. Additionally, I am having her start on exenatide and Empagliflozin-Metformin HCl. Lastly, I am having her maintain her aspirin, INSULIN SYRINGE .5CC/28G, albuterol, insulin aspart, atorvastatin, Cholecalciferol, gabapentin, HYDROcodone-acetaminophen, ACCU-CHEK GUIDE, glucose blood, ACCU-CHEK FASTCLIX LANCET, omeprazole, Lancets Thin, levothyroxine, amLODipine, and nebivolol.  Meds ordered  this encounter  Medications  . amLODipine (NORVASC) 2.5 MG tablet    Sig: Take 1 tablet (2.5 mg total) by mouth daily.    Dispense:  90 tablet    Refill:  1  . nebivolol (BYSTOLIC) 10 MG tablet    Sig: Take 1 tablet (10 mg total) by mouth daily.    Dispense:  90 tablet    Refill:  1  . exenatide (BYETTA 10 MCG PEN) 10 MCG/0.04ML SOPN injection  Sig: Inject 0.04 mLs (10 mcg total) into the skin 2 (two) times daily with a meal.    Dispense:  7.2 mL    Refill:  1  . insulin glargine (LANTUS) 100 UNIT/ML injection    Sig: Inject 0.6 mLs (60 Units total) into the skin at bedtime.    Dispense:  10 mL    Refill:  11  . Empagliflozin-Metformin HCl (SYNJARDY) 12-998 MG TABS    Sig: Take 1 tablet by mouth 2 (two) times daily.    Dispense:  180 tablet    Refill:  1     Follow-up: Return in about 4 months (around 06/02/2017).  Scarlette Calico, MD

## 2017-02-01 ENCOUNTER — Ambulatory Visit (INDEPENDENT_AMBULATORY_CARE_PROVIDER_SITE_OTHER): Payer: Medicare Other | Admitting: Psychiatry

## 2017-02-01 ENCOUNTER — Encounter (HOSPITAL_COMMUNITY): Payer: Self-pay | Admitting: Psychiatry

## 2017-02-01 VITALS — BP 198/75 | HR 89 | Resp 12 | Ht 62.0 in | Wt 160.6 lb

## 2017-02-01 DIAGNOSIS — F331 Major depressive disorder, recurrent, moderate: Secondary | ICD-10-CM | POA: Diagnosis not present

## 2017-02-01 DIAGNOSIS — F419 Anxiety disorder, unspecified: Secondary | ICD-10-CM | POA: Diagnosis not present

## 2017-02-01 DIAGNOSIS — Z87891 Personal history of nicotine dependence: Secondary | ICD-10-CM

## 2017-02-01 MED ORDER — LAMOTRIGINE 100 MG PO TABS: 100.0000 mg | ORAL_TABLET | Freq: Every day | ORAL | 0 refills | Status: DC

## 2017-02-01 MED ORDER — AMITRIPTYLINE HCL 100 MG PO TABS: 100.0000 mg | ORAL_TABLET | Freq: Every day | ORAL | 0 refills | Status: DC

## 2017-02-01 MED ORDER — ESCITALOPRAM OXALATE 20 MG PO TABS: 20.0000 mg | ORAL_TABLET | Freq: Every day | ORAL | 0 refills | Status: DC

## 2017-02-01 NOTE — Progress Notes (Signed)
[facility] MD/PA/NP OP Progress Note  02/01/2017 4:32 PM Sonia Skinner  MRN:  [id number]  Chief Complaint:  Chief Complaint    Follow-up; Depression     Subjective:  I was hospitalized because of syncopal.  HPI:  Sonia Skinner in for her follow-up appointment.  She was hospitalized last week of February because of syncopal episode.  She mentioned it was stressful when she passed out .  Apparently she believe her primary care physician switch her medication and she could not tolerate.  She stays in the hospital for days.  She is doing better.  Her anger and irritability is much better.  She still gets frustrated easily.  She is taking multiple medication.  She is preoccupied with her somatic complaints and chronic pain.  She is not sleeping as good.  She admitted increased weight gain because she is impulsively eating when she cannot sleep.  She like Lamictal because her mood is much better.  Her energy level is okay.  She still recovering from last discharge from the hospital.  Yesterday she saw primary care physician and there is some adjustment on her diabetes medication.  Patient still have tingling and numbness in her left hand.  She has a history of stroke.  She denies any hallucination or any paranoia but endorsed some time feeling sad depressed and isolated.  She is not happy with her family situation.  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  She lives with her sister.  Patient denies drinking alcohol or using any illegal substances.  Visit Diagnosis:    ICD-10-CM   1. Moderate episode of recurrent major depressive disorder (HCC) F33.1 escitalopram (LEXAPRO) 20 MG tablet    lamoTRIgine (LAMICTAL) 100 MG tablet    amitriptyline (ELAVIL) 100 MG tablet    Past Psychiatric History: Reviewed. Patient remember taking antidepressant 20 years ago. She has one psychiatric admission at [facility] at that time after taking overdose on medication. She was seen at [facility] in 2017 2008 and given  Strattera, Adderall, Zoloft, Celexa . She denies any history of mania, psychosis, hallucination or any self abusive behavior. She was never tested for ADD. In 2001 she moved to [facility] and she has seen psychiatrist there. She was given Wellbutrin to stop smoking for past few years. Patient denies any history of physical, sexual, verbal learning emotional abuse.  Past Medical History:  Past Medical History:  Diagnosis Date  . Anxiety   . Carotid artery occlusion   . Complication of anesthesia    ALLERY TO ESTER BASE  . Depression    early 73s  . Diabetes mellitus    INSULIN DEPENDENT  . GERD (gastroesophageal reflux disease)   . Headache   . Hypertension   . Stroke [facility]) March 2015    left MCA infarct  . Thyroid disease     Past Surgical History:  Procedure Laterality Date  . ABDOMINAL HYSTERECTOMY    . ANTERIOR FIXATION AND POSTERIOR MICRODISCECTOMY CERVICAL SPINE  1999  . CAROTID ENDARTERECTOMY Left 11-04-13   cea  . ENDARTERECTOMY Left 11/04/2013    Family Psychiatric History: Reviewed.  Family History:  Family History  Problem Relation Age of Onset  . Diabetes Mother   . Heart disease Mother        Before age 25  . Cancer Father        Lung  . Hypertension Sister   . Diabetes Sister   . Diabetes Sister   . Diabetes Sister  Social History:  Social History   Social History  . Marital status: Legally Separated    Spouse name: N/A  . Number of children: 0  . Years of education: College   Occupational History  . [place] History Main Topics  . Smoking status: Former Smoker    Packs/day: 0.20    Years: 20.00    Types: Cigarettes    Quit date: 11/01/2013  . Smokeless tobacco: Never Used  . Alcohol use No  . Drug use: No  . Sexual activity: Not Asked   Other Topics Concern  . None   Social History Narrative   Patient lives at home with sister.   Caffeine Use: 2 cups daily; sodas occasionally    Allergies:   Allergies  Allergen Reactions  . Anesthetics, Ester Anaphylaxis    Metabolic Disorder Labs: Recent Results (from the past 2160 hour(s))  Lipid panel     Status: Abnormal   Collection Time: 11/24/16  3:38 PM  Result Value Ref Range   Cholesterol 173 0 - 200 mg/dL    Comment: ATP III Classification       Desirable:  < 200 mg/dL               Borderline High:  200 - 239 mg/dL          High:  > = 240 mg/dL   Triglycerides 192.0 (H) 0.0 - 149.0 mg/dL    Comment: Normal:  <150 mg/dLBorderline High:  150 - 199 mg/dL   HDL 51.60 >39.00 mg/dL   VLDL 38.4 0.0 - 40.0 mg/dL   LDL Cholesterol 83 0 - 99 mg/dL   Total CHOL/HDL Ratio 3     Comment:                Men          Women1/2 Average Risk     3.4          3.3Average Risk          5.0          4.42X Average Risk          9.6          7.13X Average Risk          15.0          11.0                       NonHDL 121.02     Comment: NOTE:  Non-HDL goal should be 30 mg/dL higher than patient's LDL goal (i.e. LDL goal of < 70 mg/dL, would have non-HDL goal of < 100 mg/dL)  CBC with Differential/Platelet     Status: None   Collection Time: 11/24/16  3:38 PM  Result Value Ref Range   WBC 10.5 4.0 - 10.5 K/uL   RBC 4.64 3.87 - 5.11 Mil/uL   Hemoglobin 13.3 12.0 - 15.0 g/dL   HCT 41.1 36.0 - 46.0 %   MCV 88.8 78.0 - 100.0 fl   MCHC 32.2 30.0 - 36.0 g/dL   RDW 14.3 11.5 - 15.5 %   Platelets 220.0 150.0 - 400.0 K/uL   Neutrophils Relative % 64.3 43.0 - 77.0 %   Lymphocytes Relative 24.6 12.0 - 46.0 %   Monocytes Relative 7.5 3.0 - 12.0 %   Eosinophils Relative 2.7 0.0 - 5.0 %   Basophils Relative 0.9 0.0 - 3.0 %   Neutro Abs 6.7 1.4 -  7.7 K/uL   Lymphs Abs 2.6 0.7 - 4.0 K/uL   Monocytes Absolute 0.8 0.1 - 1.0 K/uL   Eosinophils Absolute 0.3 0.0 - 0.7 K/uL   Basophils Absolute 0.1 0.0 - 0.1 K/uL  Comprehensive metabolic panel     Status: Abnormal   Collection Time: 11/24/16  3:38 PM  Result Value Ref Range   Sodium 140 135 - 145 mEq/L    Potassium 3.2 (L) 3.5 - 5.1 mEq/L   Chloride 103 96 - 112 mEq/L   CO2 28 19 - 32 mEq/L   Glucose, Bld 121 (H) 70 - 99 mg/dL   BUN 7 6 - 23 mg/dL   Creatinine, Ser 0.83 0.40 - 1.20 mg/dL   Total Bilirubin 0.2 0.2 - 1.2 mg/dL   Alkaline Phosphatase 134 (H) 39 - 117 U/L   AST 16 0 - 37 U/L   ALT 18 0 - 35 U/L   Total Protein 7.0 6.0 - 8.3 g/dL   Albumin 4.0 3.5 - 5.2 g/dL   Calcium 8.9 8.4 - 10.5 mg/dL   GFR 88.09 >60.00 mL/min  Vitamin B12     Status: Abnormal   Collection Time: 11/24/16  3:38 PM  Result Value Ref Range   Vitamin B-12 >1500 (H) 211 - 911 pg/mL  IBC panel     Status: None   Collection Time: 11/24/16  3:38 PM  Result Value Ref Range   Iron 121 42 - 145 ug/dL   Transferrin 256.0 212.0 - 360.0 mg/dL   Saturation Ratios 33.8 20.0 - 50.0 %  Folate     Status: None   Collection Time: 11/24/16  3:38 PM  Result Value Ref Range   Folate 17.2 >5.9 ng/mL  Ferritin     Status: None   Collection Time: 11/24/16  3:38 PM  Result Value Ref Range   Ferritin 36.4 10.0 - 291.0 ng/mL  VITAMIN D 25 Hydroxy (Vit-D Deficiency, Fractures)     Status: Abnormal   Collection Time: 11/24/16  3:38 PM  Result Value Ref Range   VITD 11.94 (L) 30.00 - 100.00 ng/mL  Vitamin B1     Status: None   Collection Time: 11/24/16  3:38 PM  Result Value Ref Range   Vitamin B1 (Thiamine) 10 8 - 30 nmol/L    Comment: Vitamin supplementation within 24 hours prior to blood draw may affect the accuracy of the results. This test was developed and its analytical performance characteristics have been determined by [place], [place]. It has not been cleared or approved by the U.S. Food and Drug Administration. This assay has been validated pursuant to the CLIA regulations and is used for clinical purposes.   TSH     Status: Abnormal   Collection Time: 11/24/16  3:38 PM  Result Value Ref Range   TSH 9.41 (H) 0.35 - 4.50 uIU/mL  Urinalysis, Routine w reflex microscopic      Status: Abnormal   Collection Time: 12/11/16  4:58 PM  Result Value Ref Range   Color, Urine YELLOW YELLOW   APPearance HAZY (A) CLEAR   Specific Gravity, Urine 1.025 1.005 - 1.030   pH 5.0 5.0 - 8.0   Glucose, UA >=500 (A) NEGATIVE mg/dL   Hgb urine dipstick NEGATIVE NEGATIVE   Bilirubin Urine NEGATIVE NEGATIVE   Ketones, ur NEGATIVE NEGATIVE mg/dL   Protein, ur NEGATIVE NEGATIVE mg/dL   Nitrite NEGATIVE NEGATIVE   Leukocytes, UA TRACE (A) NEGATIVE   RBC / HPF 0-5 0 -  5 RBC/hpf   WBC, UA 0-5 0 - 5 WBC/hpf   Bacteria, UA NONE SEEN NONE SEEN   Squamous Epithelial / LPF 0-5 (A) NONE SEEN   Mucous PRESENT   CBC with Differential     Status: Abnormal   Collection Time: 12/11/16  5:22 PM  Result Value Ref Range   WBC 15.2 (H) 4.0 - 10.5 K/uL   RBC 5.15 (H) 3.87 - 5.11 MIL/uL   Hemoglobin 14.8 12.0 - 15.0 g/dL   HCT 44.6 36.0 - 46.0 %   MCV 86.6 78.0 - 100.0 fL   MCH 28.7 26.0 - 34.0 pg   MCHC 33.2 30.0 - 36.0 g/dL   RDW 12.8 11.5 - 15.5 %   Platelets 175 150 - 400 K/uL   Neutrophils Relative % 71 %   Lymphocytes Relative 20 %   Monocytes Relative 8 %   Eosinophils Relative 1 %   Basophils Relative 0 %   Neutro Abs 10.8 (H) 1.7 - 7.7 K/uL   Lymphs Abs 3.0 0.7 - 4.0 K/uL   Monocytes Absolute 1.2 (H) 0.1 - 1.0 K/uL   Eosinophils Absolute 0.2 0.0 - 0.7 K/uL   Basophils Absolute 0.0 0.0 - 0.1 K/uL   Smear Review MORPHOLOGY UNREMARKABLE   Comprehensive metabolic panel     Status: Abnormal   Collection Time: 12/11/16  5:22 PM  Result Value Ref Range   Sodium 133 (L) 135 - 145 mmol/L   Potassium 3.9 3.5 - 5.1 mmol/L   Chloride 94 (L) 101 - 111 mmol/L   CO2 24 22 - 32 mmol/L   Glucose, Bld 190 (H) 65 - 99 mg/dL   BUN 28 (H) 6 - 20 mg/dL   Creatinine, Ser 2.09 (H) 0.44 - 1.00 mg/dL   Calcium 9.8 8.9 - 10.3 mg/dL   Total Protein 7.7 6.5 - 8.1 g/dL   Albumin 4.4 3.5 - 5.0 g/dL   AST 16 15 - 41 U/L   ALT 16 14 - 54 U/L   Alkaline Phosphatase 141 (H) 38 - 126 U/L   Total  Bilirubin 0.6 0.3 - 1.2 mg/dL   GFR calc non Af Amer 23 (L) >60 mL/min   GFR calc Af Amer 27 (L) >60 mL/min    Comment: (NOTE) The eGFR has been calculated using the CKD EPI equation. This calculation has not been validated in all clinical situations. eGFR's persistently <60 mL/min signify possible Chronic Kidney Disease.    Anion gap 15 5 - 15  Lipase, blood     Status: None   Collection Time: 12/11/16  5:22 PM  Result Value Ref Range   Lipase 29 11 - 51 U/L  I-stat troponin, ED     Status: None   Collection Time: 12/11/16  5:51 PM  Result Value Ref Range   Troponin i, poc 0.00 0.00 - 0.08 ng/mL   Comment 3            Comment: Due to the release kinetics of cTnI, a negative result within the first hours of the onset of symptoms does not rule out myocardial infarction with certainty. If myocardial infarction is still suspected, repeat the test at appropriate intervals.   I-Stat CG4 Lactic Acid, ED     Status: Abnormal   Collection Time: 12/11/16  5:54 PM  Result Value Ref Range   Lactic Acid, Venous 3.23 (HH) 0.5 - 1.9 mmol/L   Comment NOTIFIED PHYSICIAN   Sodium, urine, random     Status: None  Collection Time: 12/11/16  7:14 PM  Result Value Ref Range   Sodium, Ur 22 mmol/L  Creatinine, urine, random     Status: None   Collection Time: 12/11/16  7:14 PM  Result Value Ref Range   Creatinine, Urine 449.73 mg/dL    Comment: RESULTS CONFIRMED BY MANUAL DILUTION  Urea nitrogen, urine     Status: None   Collection Time: 12/11/16  7:14 PM  Result Value Ref Range   Urea Nitrogen, Ur 170 Not Estab. mg/dL    Comment: (NOTE) Performed At: [facility] [place] [facility], [place] [id number] Lindon Romp MD [id number]   CBG monitoring, ED     Status: Abnormal   Collection Time: 12/11/16  7:32 PM  Result Value Ref Range   Glucose-Capillary 157 (H) 65 - 99 mg/dL  Culture, blood (routine x 2)     Status: None   Collection Time: 12/11/16  9:15 PM  Result  Value Ref Range   Specimen Description BLOOD LEFT ARM    Special Requests      BOTTLES DRAWN AEROBIC ONLY Blood Culture adequate volume   Culture NO GROWTH 5 DAYS    Report Status 12/16/2016 FINAL   Lactic acid, plasma     Status: None   Collection Time: 12/11/16  9:16 PM  Result Value Ref Range   Lactic Acid, Venous 1.7 0.5 - 1.9 mmol/L  Culture, blood (routine x 2)     Status: None   Collection Time: 12/11/16  9:20 PM  Result Value Ref Range   Specimen Description BLOOD LEFT HAND    Special Requests      BOTTLES DRAWN AEROBIC ONLY Blood Culture adequate volume   Culture NO GROWTH 5 DAYS    Report Status 12/16/2016 FINAL   Glucose, capillary     Status: Abnormal   Collection Time: 12/11/16  9:41 PM  Result Value Ref Range   Glucose-Capillary 129 (H) 65 - 99 mg/dL  Lactic acid, plasma     Status: None   Collection Time: 12/11/16 11:53 PM  Result Value Ref Range   Lactic Acid, Venous 1.6 0.5 - 1.9 mmol/L  MRSA PCR Screening     Status: None   Collection Time: 12/12/16 12:25 AM  Result Value Ref Range   MRSA by PCR NEGATIVE NEGATIVE    Comment:        The GeneXpert MRSA Assay (FDA approved for NASAL specimens only), is one component of a comprehensive MRSA colonization surveillance program. It is not intended to diagnose MRSA infection nor to guide or monitor treatment for MRSA infections.   Basic metabolic panel     Status: Abnormal   Collection Time: 12/12/16  5:36 AM  Result Value Ref Range   Sodium 136 135 - 145 mmol/L   Potassium 4.2 3.5 - 5.1 mmol/L   Chloride 99 (L) 101 - 111 mmol/L   CO2 25 22 - 32 mmol/L   Glucose, Bld 201 (H) 65 - 99 mg/dL   BUN 32 (H) 6 - 20 mg/dL   Creatinine, Ser 2.17 (H) 0.44 - 1.00 mg/dL   Calcium 8.7 (L) 8.9 - 10.3 mg/dL   GFR calc non Af Amer 22 (L) >60 mL/min   GFR calc Af Amer 26 (L) >60 mL/min    Comment: (NOTE) The eGFR has been calculated using the CKD EPI equation. This calculation has not been validated in all clinical  situations. eGFR's persistently <60 mL/min signify possible Chronic Kidney Disease.    Anion  gap 12 5 - 15  CBC     Status: None   Collection Time: 12/12/16  5:36 AM  Result Value Ref Range   WBC 8.7 4.0 - 10.5 K/uL   RBC 4.46 3.87 - 5.11 MIL/uL   Hemoglobin 12.5 12.0 - 15.0 g/dL   HCT 39.3 36.0 - 46.0 %   MCV 88.1 78.0 - 100.0 fL   MCH 28.0 26.0 - 34.0 pg   MCHC 31.8 30.0 - 36.0 g/dL   RDW 13.5 11.5 - 15.5 %   Platelets 161 150 - 400 K/uL  Glucose, capillary     Status: Abnormal   Collection Time: 12/12/16  7:41 AM  Result Value Ref Range   Glucose-Capillary 206 (H) 65 - 99 mg/dL  Glucose, capillary     Status: Abnormal   Collection Time: 12/12/16 12:34 PM  Result Value Ref Range   Glucose-Capillary 166 (H) 65 - 99 mg/dL  Glucose, capillary     Status: Abnormal   Collection Time: 12/12/16  5:05 PM  Result Value Ref Range   Glucose-Capillary 251 (H) 65 - 99 mg/dL  Glucose, capillary     Status: Abnormal   Collection Time: 12/12/16  9:46 PM  Result Value Ref Range   Glucose-Capillary 154 (H) 65 - 99 mg/dL  Glucose, capillary     Status: Abnormal   Collection Time: 12/13/16  7:47 AM  Result Value Ref Range   Glucose-Capillary 332 (H) 65 - 99 mg/dL  Basic metabolic panel     Status: Abnormal   Collection Time: 12/13/16  9:52 AM  Result Value Ref Range   Sodium 134 (L) 135 - 145 mmol/L   Potassium 4.2 3.5 - 5.1 mmol/L   Chloride 105 101 - 111 mmol/L   CO2 22 22 - 32 mmol/L   Glucose, Bld 381 (H) 65 - 99 mg/dL   BUN 23 (H) 6 - 20 mg/dL   Creatinine, Ser 1.22 (H) 0.44 - 1.00 mg/dL   Calcium 8.1 (L) 8.9 - 10.3 mg/dL   GFR calc non Af Amer 45 (L) >60 mL/min   GFR calc Af Amer 52 (L) >60 mL/min    Comment: (NOTE) The eGFR has been calculated using the CKD EPI equation. This calculation has not been validated in all clinical situations. eGFR's persistently <60 mL/min signify possible Chronic Kidney Disease.    Anion gap 7 5 - 15  Glucose, capillary     Status: Abnormal    Collection Time: 12/13/16 11:56 AM  Result Value Ref Range   Glucose-Capillary 463 (H) 65 - 99 mg/dL  ECHOCARDIOGRAM COMPLETE     Status: None   Collection Time: 12/13/16 12:24 PM  Result Value Ref Range   Weight 2,499.2 oz   Height 62 in   BP 128/57 mmHg  T4, free     Status: Abnormal   Collection Time: 12/13/16  3:47 PM  Result Value Ref Range   Free T4 1.52 (H) 0.61 - 1.12 ng/dL    Comment: (NOTE) Biotin ingestion may interfere with free T4 tests. If the results are inconsistent with the TSH level, previous test results, or the clinical presentation, then consider biotin interference. If needed, order repeat testing after stopping biotin.   T3, free     Status: None   Collection Time: 12/13/16  3:47 PM  Result Value Ref Range   T3, Free 3.0 2.0 - 4.4 pg/mL    Comment: (NOTE) Performed At: [facility] [address] [place], [place] [id number] Darrel Hoover  F MD [id number]   Glucose, capillary     Status: Abnormal   Collection Time: 12/13/16  4:54 PM  Result Value Ref Range   Glucose-Capillary 149 (H) 65 - 99 mg/dL  Glucose, capillary     Status: Abnormal   Collection Time: 12/13/16  9:41 PM  Result Value Ref Range   Glucose-Capillary 212 (H) 65 - 99 mg/dL  Basic metabolic panel     Status: Abnormal   Collection Time: 12/14/16  3:45 AM  Result Value Ref Range   Sodium 138 135 - 145 mmol/L   Potassium 4.2 3.5 - 5.1 mmol/L   Chloride 110 101 - 111 mmol/L   CO2 22 22 - 32 mmol/L   Glucose, Bld 312 (H) 65 - 99 mg/dL   BUN 15 6 - 20 mg/dL   Creatinine, Ser 1.03 (H) 0.44 - 1.00 mg/dL   Calcium 8.1 (L) 8.9 - 10.3 mg/dL   GFR calc non Af Amer 55 (L) >60 mL/min   GFR calc Af Amer >60 >60 mL/min    Comment: (NOTE) The eGFR has been calculated using the CKD EPI equation. This calculation has not been validated in all clinical situations. eGFR's persistently <60 mL/min signify possible Chronic Kidney Disease.    Anion gap 6 5 - 15  Glucose, capillary      Status: Abnormal   Collection Time: 12/14/16  7:42 AM  Result Value Ref Range   Glucose-Capillary 229 (H) 65 - 99 mg/dL  Glucose, capillary     Status: Abnormal   Collection Time: 12/14/16 11:44 AM  Result Value Ref Range   Glucose-Capillary 324 (H) 65 - 99 mg/dL  Basic metabolic panel     Status: Abnormal   Collection Time: 12/20/16  3:16 PM  Result Value Ref Range   Sodium 137 135 - 145 mEq/L   Potassium 4.2 3.5 - 5.1 mEq/L   Chloride 100 96 - 112 mEq/L   CO2 29 19 - 32 mEq/L   Glucose, Bld 348 (H) 70 - 99 mg/dL   BUN 13 6 - 23 mg/dL   Creatinine, Ser 1.08 0.40 - 1.20 mg/dL   Calcium 9.6 8.4 - 10.5 mg/dL   GFR 65.00 >60.00 mL/min  Microalbumin / creatinine urine ratio     Status: None   Collection Time: 12/20/16  3:16 PM  Result Value Ref Range   Microalb, Ur <0.7 0.0 - 1.9 mg/dL   Creatinine,U 35.7 mg/dL   Microalb Creat Ratio 2.0 0.0 - 30.0 mg/g  TSH     Status: None   Collection Time: 12/20/16  3:16 PM  Result Value Ref Range   TSH 2.09 0.35 - 4.50 uIU/mL  Urinalysis, Routine w reflex microscopic     Status: Abnormal   Collection Time: 12/20/16  3:16 PM  Result Value Ref Range   Color, Urine YELLOW Yellow;Lt. Yellow   APPearance CLEAR Clear   Specific Gravity, Urine <=1.005 (A) 1.000 - 1.030   pH 6.0 5.0 - 8.0   Total Protein, Urine NEGATIVE Negative   Urine Glucose >=1000 (A) Negative   Ketones, ur NEGATIVE Negative   Bilirubin Urine NEGATIVE Negative   Hgb urine dipstick NEGATIVE Negative   Urobilinogen, UA 0.2 0.0 - 1.0   Leukocytes, UA NEGATIVE Negative   Nitrite NEGATIVE Negative   WBC, UA none seen 0-2/hpf   RBC / HPF none seen 0-2/hpf   Squamous Epithelial / LPF Rare(0-4/hpf) Rare(0-4/hpf)  Hepatitis C antibody     Status: None   Collection Time:  12/20/16  3:16 PM  Result Value Ref Range   HCV Ab NEGATIVE NEGATIVE  POCT glycosylated hemoglobin (Hb A1C)     Status: Abnormal   Collection Time: 01/31/17  4:55 PM  Result Value Ref Range   Hemoglobin A1C  11.7   POCT Glucose (Device for Home Use)     Status: Abnormal   Collection Time: 01/31/17  4:55 PM  Result Value Ref Range   Glucose Fasting, POC 163 (A) 70 - 99 mg/dL   POC Glucose  70 - 99 mg/dl   Lab Results  Component Value Date   HGBA1C 11.7 01/31/2017   MPG 255 (H) 11/02/2013   MPG 249 (H) 11/01/2013   No results found for: PROLACTIN Lab Results  Component Value Date   CHOL 173 11/24/2016   TRIG 192.0 (H) 11/24/2016   HDL 51.60 11/24/2016   CHOLHDL 3 11/24/2016   VLDL 38.4 11/24/2016   LDLCALC 83 11/24/2016   LDLCALC 105 (H) 09/18/2014     Current Medications: Current Outpatient Prescriptions  Medication Sig Dispense Refill  . albuterol (PROVENTIL HFA;VENTOLIN HFA) 108 (90 Base) MCG/ACT inhaler Inhale 1-2 puffs into the lungs every 6 (six) hours as needed for wheezing or shortness of breath.    Marland Kitchen amitriptyline (ELAVIL) 100 MG tablet Take 1 tablet (100 mg total) by mouth at bedtime. 90 tablet 0  . amLODipine (NORVASC) 2.5 MG tablet Take 1 tablet (2.5 mg total) by mouth daily. 90 tablet 1  . aspirin 325 MG tablet Take 1 tablet (325 mg total) by mouth daily.    Marland Kitchen atorvastatin (LIPITOR) 40 MG tablet Take 1 tablet (40 mg total) by mouth daily. 90 tablet 1  . Blood Glucose Monitoring Suppl (ACCU-CHEK GUIDE) w/Device KIT 1 Act by Does not apply route 4 (four) times daily - after meals and at bedtime. 2 kit 0  . Cholecalciferol 2000 units TABS Take 1 tablet (2,000 Units total) by mouth daily. 90 tablet 3  . Empagliflozin-Metformin HCl (SYNJARDY) 12-998 MG TABS Take 1 tablet by mouth 2 (two) times daily. 180 tablet 1  . escitalopram (LEXAPRO) 20 MG tablet Take 1 tablet (20 mg total) by mouth daily. 90 tablet 0  . exenatide (BYETTA 10 MCG PEN) 10 MCG/0.04ML SOPN injection Inject 0.04 mLs (10 mcg total) into the skin 2 (two) times daily with a meal. 7.2 mL 1  . gabapentin (NEURONTIN) 100 MG capsule Take 1 capsule (100 mg total) by mouth 2 (two) times daily. 60 capsule 0  . glucose  blood (ACCU-CHEK GUIDE) test strip 1 each by Other route 2 (two) times daily. Use to check blood sugars twice a day 100 each 5  . HYDROcodone-acetaminophen (NORCO/VICODIN) 5-325 MG tablet Take 1 tablet by mouth every 8 (eight) hours as needed for moderate pain. 10 tablet 0  . insulin aspart (NOVOLOG FLEXPEN) 100 UNIT/ML FlexPen 8 units with breakfast, 4 with lunch, and 30 units with supper, and pen needles 4/day. 15 mL 11  . insulin glargine (LANTUS) 100 UNIT/ML injection Inject 0.6 mLs (60 Units total) into the skin at bedtime. 10 mL 11  . INSULIN SYRINGE .5CC/28G 28G X 1/2" 0.5 ML MISC Give insulin once per night 100 each 12  . lamoTRIgine (LAMICTAL) 100 MG tablet Take 1 tablet (100 mg total) by mouth daily. 90 tablet 0  . Lancets Misc. (ACCU-CHEK FASTCLIX LANCET) KIT 1 Act by Does not apply route 4 (four) times daily. 1 kit 1  . Lancets Thin MISC Use to check blood sugar  4 times per day. 400 each 3  . levothyroxine (SYNTHROID, LEVOTHROID) 125 MCG tablet Take 1 tablet (125 mcg total) by mouth daily. 90 tablet 1  . loratadine (CLARITIN) 10 MG tablet TAKE ONE TABLET BY MOUTH ONCE DAILY (Patient taking differently: TAKE ONE TABLET BY MOUTH ONCE DAILY AS NEEDED FOR ALLERGIES) 30 tablet 2  . nebivolol (BYSTOLIC) 10 MG tablet Take 1 tablet (10 mg total) by mouth daily. 90 tablet 1  . omeprazole (PRILOSEC) 40 MG capsule TAKE 1 CAPSULE BY MOUTH ONCE DAILY 90 capsule 0   No current facility-administered medications for this visit.     Neurologic: Headache: No Seizure: No Paresthesias: Tingling in her left hand  Musculoskeletal: Strength & Muscle Tone: within normal limits Gait & Station: normal Patient leans: N/A  Psychiatric Specialty Exam: Review of Systems  Constitutional: Negative.   HENT: Negative.   Respiratory: Negative.   Cardiovascular: Negative.   Gastrointestinal: Negative.   Musculoskeletal: Positive for back pain and joint pain.  Skin: Negative.  Negative for itching and rash.   Neurological: Positive for tingling.  Psychiatric/Behavioral: Positive for depression. The patient is nervous/anxious and has insomnia.     Blood pressure (!) 198/75, pulse 89, resp. rate 12, height _0  (1.575 m), weight 160 lb 9.6 oz (72.8 kg).Body mass index is 29.37 kg/m.  General Appearance: Casual  Eye Contact:  Fair  Speech:  Slow  Volume:  Normal  Mood:  Anxious and Depressed  Affect:  Constricted  Thought Process:  Goal Directed  Orientation:  Full (Time, Place, and Person)  Thought Content: Logical and Rumination   Suicidal Thoughts:  No  Homicidal Thoughts:  No  Memory:  Immediate;   Good Recent;   Fair Remote;   Fair  Judgement:  Good  Insight:  Good  Psychomotor Activity:  Normal  Concentration:  Concentration: Good and Attention Span: Good  Recall:  Good  Fund of Knowledge: Good  Language: Good  Akathisia:  No  Handed:  Right  AIMS (if indicated):  0  Assets:  Communication Skills Desire for Improvement Housing Resilience Social Support Vocational/Educational  ADL's:  Intact  Cognition: WNL  Sleep:  Fair.     Assessment: Major depressive disorder, recurrent.  Anxiety disorder NOS.  Plan: Patient continues to have anxiety and nervousness.  She has insomnia.  Recommended to increase amitriptyline 100 mg at bedtime.  Continue Lexapro 20 mg and increase Lamictal 100 mg.  Patient has no rash, itching, tremors or shakes.  I reviewed blood work results and recent discharge summary and other collateral information.  Encourage to watch her calorie intake and to regular exercise.  Patient is not interested in counseling.  Follow-up in 3 months. Time spent 25 minutes.  More than 50% of the time spent in psychoeducation, counseling and coordination of care.  Discuss safety plan that anytime having active suicidal thoughts or homicidal thoughts then patient need to call 911 or go to the local emergency room.    Ralph Benavidez T., MD 02/01/2017, 4:32 PM

## 2017-02-02 ENCOUNTER — Encounter: Payer: Self-pay | Admitting: Neurology

## 2017-02-02 ENCOUNTER — Encounter: Payer: Self-pay | Admitting: Internal Medicine

## 2017-02-02 MED ORDER — LORATADINE 10 MG PO TABS: ORAL_TABLET | ORAL | 0 refills | Status: DC

## 2017-02-02 NOTE — Telephone Encounter (Signed)
Duplicate email refill already been sent...[facility]

## 2017-02-06 ENCOUNTER — Encounter: Payer: Self-pay | Admitting: Internal Medicine

## 2017-02-07 ENCOUNTER — Other Ambulatory Visit: Payer: Self-pay

## 2017-02-07 ENCOUNTER — Other Ambulatory Visit: Payer: Self-pay | Admitting: Internal Medicine

## 2017-02-07 DIAGNOSIS — G43011 Migraine without aura, intractable, with status migrainosus: Secondary | ICD-10-CM

## 2017-02-07 DIAGNOSIS — E038 Other specified hypothyroidism: Secondary | ICD-10-CM

## 2017-02-07 DIAGNOSIS — I10 Essential (primary) hypertension: Secondary | ICD-10-CM

## 2017-02-07 MED ORDER — GABAPENTIN 100 MG PO CAPS: 100.0000 mg | ORAL_CAPSULE | Freq: Two times a day (BID) | ORAL | 1 refills | Status: DC

## 2017-02-07 MED ORDER — AMLODIPINE BESYLATE 2.5 MG PO TABS: 2.5000 mg | ORAL_TABLET | Freq: Every day | ORAL | 1 refills | Status: DC

## 2017-02-07 MED ORDER — LEVOTHYROXINE SODIUM 125 MCG PO TABS: 125.0000 ug | ORAL_TABLET | Freq: Every day | ORAL | 1 refills | Status: DC

## 2017-02-07 NOTE — Telephone Encounter (Signed)
Please advise on the cyclobenziprine rf.   Amlodipine and levothyroxine refill completed.

## 2017-02-21 ENCOUNTER — Other Ambulatory Visit (HOSPITAL_COMMUNITY): Payer: Self-pay | Admitting: Psychiatry

## 2017-02-21 DIAGNOSIS — F331 Major depressive disorder, recurrent, moderate: Secondary | ICD-10-CM

## 2017-02-24 MED FILL — ESCITALOPRAM 20 MG TABLET: 20 | 30 days supply | Qty: 30 | Fill #0 | Status: TO

## 2017-02-27 ENCOUNTER — Encounter: Payer: Self-pay | Admitting: Internal Medicine

## 2017-03-03 ENCOUNTER — Other Ambulatory Visit (HOSPITAL_COMMUNITY): Payer: Self-pay | Admitting: Psychiatry

## 2017-03-03 ENCOUNTER — Other Ambulatory Visit: Payer: Self-pay | Admitting: Internal Medicine

## 2017-03-03 DIAGNOSIS — F331 Major depressive disorder, recurrent, moderate: Secondary | ICD-10-CM

## 2017-03-10 ENCOUNTER — Other Ambulatory Visit (HOSPITAL_COMMUNITY): Payer: Self-pay | Admitting: Psychiatry

## 2017-03-16 ENCOUNTER — Encounter: Payer: Self-pay | Admitting: Internal Medicine

## 2017-03-17 ENCOUNTER — Other Ambulatory Visit (HOSPITAL_COMMUNITY): Payer: Self-pay | Admitting: Psychiatry

## 2017-03-17 DIAGNOSIS — F331 Major depressive disorder, recurrent, moderate: Secondary | ICD-10-CM

## 2017-04-05 ENCOUNTER — Encounter: Payer: Self-pay | Admitting: Internal Medicine

## 2017-04-06 ENCOUNTER — Ambulatory Visit: Payer: Self-pay | Admitting: Vascular Surgery

## 2017-04-06 ENCOUNTER — Encounter: Payer: Self-pay | Admitting: Internal Medicine

## 2017-04-06 ENCOUNTER — Encounter (HOSPITAL_COMMUNITY): Payer: Self-pay

## 2017-04-13 ENCOUNTER — Ambulatory Visit: Payer: Self-pay | Admitting: Vascular Surgery

## 2017-04-13 ENCOUNTER — Encounter (HOSPITAL_COMMUNITY): Payer: Self-pay

## 2017-04-13 ENCOUNTER — Encounter: Payer: Self-pay | Admitting: Vascular Surgery

## 2017-04-20 ENCOUNTER — Ambulatory Visit: Payer: Medicare Other | Admitting: Vascular Surgery

## 2017-04-20 ENCOUNTER — Other Ambulatory Visit: Payer: Self-pay | Admitting: Endocrinology

## 2017-04-20 DIAGNOSIS — E785 Hyperlipidemia, unspecified: Secondary | ICD-10-CM

## 2017-04-26 ENCOUNTER — Other Ambulatory Visit: Payer: Self-pay

## 2017-04-26 MED ORDER — INSULIN LISPRO 100 UNIT/ML (KWIKPEN): PEN_INJECTOR | SUBCUTANEOUS | 11 refills | Status: DC

## 2017-05-02 ENCOUNTER — Other Ambulatory Visit: Payer: Self-pay | Admitting: Internal Medicine

## 2017-05-02 ENCOUNTER — Other Ambulatory Visit: Payer: Self-pay | Admitting: Endocrinology

## 2017-05-04 ENCOUNTER — Other Ambulatory Visit: Payer: Self-pay | Admitting: Endocrinology

## 2017-05-05 ENCOUNTER — Encounter (HOSPITAL_COMMUNITY): Payer: Self-pay | Admitting: Psychiatry

## 2017-05-05 ENCOUNTER — Ambulatory Visit (HOSPITAL_COMMUNITY): Payer: Medicare Other | Admitting: Psychiatry

## 2017-05-05 ENCOUNTER — Ambulatory Visit (INDEPENDENT_AMBULATORY_CARE_PROVIDER_SITE_OTHER): Payer: Medicare Other | Admitting: Psychiatry

## 2017-05-05 DIAGNOSIS — G4700 Insomnia, unspecified: Secondary | ICD-10-CM

## 2017-05-05 DIAGNOSIS — R413 Other amnesia: Secondary | ICD-10-CM | POA: Diagnosis not present

## 2017-05-05 DIAGNOSIS — M2550 Pain in unspecified joint: Secondary | ICD-10-CM | POA: Diagnosis not present

## 2017-05-05 DIAGNOSIS — R454 Irritability and anger: Secondary | ICD-10-CM | POA: Diagnosis not present

## 2017-05-05 DIAGNOSIS — Z9114 Patient's other noncompliance with medication regimen: Secondary | ICD-10-CM

## 2017-05-05 DIAGNOSIS — R450 Nervousness: Secondary | ICD-10-CM | POA: Diagnosis not present

## 2017-05-05 DIAGNOSIS — M549 Dorsalgia, unspecified: Secondary | ICD-10-CM

## 2017-05-05 DIAGNOSIS — F419 Anxiety disorder, unspecified: Secondary | ICD-10-CM

## 2017-05-05 DIAGNOSIS — F331 Major depressive disorder, recurrent, moderate: Secondary | ICD-10-CM | POA: Diagnosis not present

## 2017-05-05 DIAGNOSIS — R4584 Anhedonia: Secondary | ICD-10-CM

## 2017-05-05 DIAGNOSIS — Z87891 Personal history of nicotine dependence: Secondary | ICD-10-CM | POA: Diagnosis not present

## 2017-05-05 MED ORDER — LAMOTRIGINE 100 MG PO TABS: 100.0000 mg | ORAL_TABLET | Freq: Every day | ORAL | 0 refills | Status: DC

## 2017-05-05 MED ORDER — AMITRIPTYLINE HCL 100 MG PO TABS: 100.0000 mg | ORAL_TABLET | Freq: Every day | ORAL | 0 refills | Status: DC

## 2017-05-05 MED ORDER — ESCITALOPRAM OXALATE 20 MG PO TABS: 20.0000 mg | ORAL_TABLET | Freq: Every day | ORAL | 0 refills | Status: DC

## 2017-05-05 NOTE — Progress Notes (Signed)
BH MD/PA/NP OP Progress Note  05/05/2017 8:27 AM [facility]  MRN:  [id number]  Chief Complaint: I'm feeling depressed and sad.  My memory is getting worse.  HPI: [facility] came in for her follow-up appointment.  She reported that she has been noncompliant with Lamictal and Lexapro for past few weeks because she could not able to get the medication from the pharmacy.  She's been experiencing increased anxiety, depression, crying spells and fatigue.  She also worried about her short-term memory loss.  She scheduled to see neurologist in coming weeks.  When I ask why she did not call us so we can call in to a different pharmacy, patient replied that she forgot.  She admitted poor sleep, anhedonia, irritability and anxiety.  She lives with her sister and together they are renting the house but she is under pressure from her other sister to move in their parent's house.  Patient does not want to move into their parents house because it is a small.  Her parents are deceased.  She worried about her general health.  She had a stroke and she has numbness, tingling and memory impairment.  She denies any paranoia or any hallucination but endorsed sadness and irritability.  She is working as a Oceanographer at [employer].  She denies any drinking alcohol or using any illegal substances.  Her energy level is fair.  Her vital signs are okay.  Patient denies any suicidal thoughts or any self abusive behavior.  Visit Diagnosis:    ICD-10-CM   1. Moderate episode of recurrent major depressive disorder (HCC) F33.1 lamoTRIgine (LAMICTAL) 100 MG tablet    escitalopram (LEXAPRO) 20 MG tablet    amitriptyline (ELAVIL) 100 MG tablet    Past Psychiatric History: Reviewed. Patient reported taking antidepressants the past 20 years.  She has history of taking overdose and admission at [facility].  She was seeing a physician at [facility] and given the Strattera, Adderall, Zoloft, Celexa.  Patient  denies any history of psychosis, mania or any hallucination.  She was never tested for ADD.  In 2001 she moved to [place] and she saw psychiatrist there.  She was given Wellbutrin to stop smoking.  Patient denies any history of sexual, verbal and physical and emotional abuse.  Past Medical History:  Past Medical History:  Diagnosis Date  . Anxiety   . Carotid artery occlusion   . Complication of anesthesia    ALLERY TO ESTER BASE  . Depression    early 68s  . Diabetes mellitus    INSULIN DEPENDENT  . GERD (gastroesophageal reflux disease)   . Headache   . Hypertension   . Stroke [facility]) March 2015    left MCA infarct  . Thyroid disease     Past Surgical History:  Procedure Laterality Date  . ABDOMINAL HYSTERECTOMY    . ANTERIOR FIXATION AND POSTERIOR MICRODISCECTOMY CERVICAL SPINE  1999  . CAROTID ENDARTERECTOMY Left 11-04-13   cea  . ENDARTERECTOMY Left 11/04/2013    Family Psychiatric History: Reviewed.  Family History:  Family History  Problem Relation Age of Onset  . Diabetes Mother   . Heart disease Mother        Before age 37  . Cancer Father        Lung  . Hypertension Sister   . Diabetes Sister   . Diabetes Sister   . Diabetes Sister     Social History:  Social History   Social History  .  Marital status: Legally Separated    Spouse name: N/A  . Number of children: 0  . Years of education: College   Occupational History  . [facility]  . Smoking status: Former Smoker    Packs/day: 0.20    Years: 20.00    Types: Cigarettes    Quit date: 11/01/2013  . Smokeless tobacco: Never Used  . Alcohol use No  . Drug use: No  . Sexual activity: Yes   Other [facility] Concern  . None   Social History Narrative   Patient lives at home with sister.   Caffeine Use: 2 cups daily; sodas occasionally    Allergies:  Allergies  Allergen Reactions  . Anesthetics, Ester Anaphylaxis    Metabolic Disorder Labs: Lab  Results  Component Value Date   HGBA1C 11.7 01/31/2017   MPG 255 (H) 11/02/2013   MPG 249 (H) 11/01/2013   No results found for: PROLACTIN Lab Results  Component Value Date   CHOL 173 11/24/2016   TRIG 192.0 (H) 11/24/2016   HDL 51.60 11/24/2016   CHOLHDL 3 11/24/2016   VLDL 38.4 11/24/2016   LDLCALC 83 11/24/2016   LDLCALC 105 (H) 09/18/2014   Lab Results  Component Value Date   TSH 2.09 12/20/2016   TSH 9.41 (H) 11/24/2016    Therapeutic Level Labs: No results found for: LITHIUM No results found for: VALPROATE No components found for:  CBMZ  Current Medications: Current Outpatient Prescriptions  Medication Sig Dispense Refill  . albuterol (PROVENTIL HFA;VENTOLIN HFA) 108 (90 Base) MCG/ACT inhaler Inhale 1-2 puffs into the lungs every 6 (six) hours as needed for wheezing or shortness of breath.    Marland Kitchen amitriptyline (ELAVIL) 100 MG tablet Take 1 tablet (100 mg total) by mouth at bedtime. 90 tablet 0  . amLODipine (NORVASC) 2.5 MG tablet Take 1 tablet (2.5 mg total) by mouth daily. 90 tablet 1  . aspirin 325 MG tablet Take 1 tablet (325 mg total) by mouth daily.    Marland Kitchen atorvastatin (LIPITOR) 40 MG tablet TAKE 1 TABLET BY MOUTH ONCE DAILY 90 tablet 1  . Blood Glucose Monitoring Suppl (ACCU-CHEK GUIDE) w/Device KIT 1 Act by Does not apply route 4 (four) times daily - after meals and at bedtime. 2 kit 0  . Cholecalciferol 2000 units TABS Take 1 tablet (2,000 Units total) by mouth daily. 90 tablet 3  . Empagliflozin-Metformin HCl (SYNJARDY) 12-998 MG TABS Take 1 tablet by mouth 2 (two) times daily. 180 tablet 1  . EQ ALLERGY RELIEF 10 MG tablet TAKE 1 TABLET BY MOUTH ONCE DAILY AS NEEDED FOR ALLERGIES 90 tablet 3  . escitalopram (LEXAPRO) 20 MG tablet Take 1 tablet (20 mg total) by mouth daily. 90 tablet 0  . exenatide (BYETTA 10 MCG PEN) 10 MCG/0.04ML SOPN injection Inject 0.04 mLs (10 mcg total) into the skin 2 (two) times daily with a meal. 7.2 mL 1  . fluconazole (DIFLUCAN) 150  MG tablet TAKE 1 TABLET BY MOUTH ONCE 1 tablet 3  . gabapentin (NEURONTIN) 100 MG capsule Take 1 capsule (100 mg total) by mouth 2 (two) times daily. 180 capsule 1  . glucose blood (ACCU-CHEK GUIDE) test strip 1 each by Other route 2 (two) times daily. Use to check blood sugars twice a day 100 each 5  . HYDROcodone-acetaminophen (NORCO/VICODIN) 5-325 MG tablet Take 1 tablet by mouth every 8 (eight) hours as needed for moderate pain. 10 tablet 0  . insulin glargine (LANTUS)  100 UNIT/ML injection Inject 0.6 mLs (60 Units total) into the skin at bedtime. 10 mL 11  . insulin lispro (HUMALOG) 100 UNIT/ML KiwkPen Inject into skin subcutaneously 8 units at breakfast, 4 units at lunch and 30 untis at supper. Pen needles 4x daily. 15 pen 11  . INSULIN SYRINGE .5CC/28G 28G X 1/2" 0.5 ML MISC Give insulin once per night 100 each 12  . lamoTRIgine (LAMICTAL) 100 MG tablet Take 1 tablet (100 mg total) by mouth daily. 90 tablet 0  . Lancets Misc. (ACCU-CHEK FASTCLIX LANCET) KIT 1 Act by Does not apply route 4 (four) times daily. 1 kit 1  . Lancets Thin MISC Use to check blood sugar 4 times per day. 400 each 3  . levothyroxine (SYNTHROID, LEVOTHROID) 125 MCG tablet Take 1 tablet (125 mcg total) by mouth daily. 90 tablet 1  . nebivolol (BYSTOLIC) 10 MG tablet Take 1 tablet (10 mg total) by mouth daily. 90 tablet 1  . omeprazole (PRILOSEC) 40 MG capsule TAKE 1 CAPSULE BY MOUTH ONCE DAILY 90 capsule 0  . omeprazole (PRILOSEC) 40 MG capsule TAKE 1 CAPSULE BY MOUTH ONCE DAILY 90 capsule 0   No current facility-administered medications for this visit.      Musculoskeletal: Strength & Muscle Tone: within normal limits Gait & Station: normal Patient leans: N/A  Psychiatric Specialty Exam: Review of Systems  Constitutional: Negative.   HENT: Negative.   Respiratory: Negative.   Gastrointestinal: Negative.   Genitourinary: Negative.   Musculoskeletal: Positive for back pain and joint pain.  Skin: Negative for  itching and rash.  Neurological: Positive for tingling.       Numbness in her left hand  Endo/Heme/Allergies: Negative.   Psychiatric/Behavioral: Positive for depression and memory loss. The patient is nervous/anxious.     Blood pressure 126/70, pulse 96, height '5\' 2"'$  (1.575 m), weight 154 lb 9.6 oz (70.1 kg).Body mass index is 28.28 kg/m.  General Appearance: Casual  Eye Contact:  Fair  Speech:  Slow  Volume:  Decreased  Mood:  Depressed and Dysphoric  Affect:  Constricted  Thought Process:  Goal Directed  Orientation:  Full (Time, Place, and Person)  Thought Content: Rumination   Suicidal Thoughts:  No  Homicidal Thoughts:  No  Memory:  Immediate;   Fair Recent;   Fair Remote;   Fair  Judgement:  Fair  Insight:  Good  Psychomotor Activity:  Decreased  Concentration:  Concentration: Fair and Attention Span: Fair  Recall:  Good  Fund of Knowledge: Good  Language: Good  Akathisia:  No  Handed:  Right  AIMS (if indicated): not done  Assets:  Communication Skills Desire for Improvement Housing Resilience Social Support Transportation  ADL's:  Intact  Cognition: Impaired,  Mild  Sleep:  Fair   Screenings: GAD-7     Office Visit from 08/14/2015 in [place]  Total GAD-7 Score  7    PHQ2-9     Office Visit from 11/20/2015 in [place] Office Visit from 08/14/2015 in [place] Office Visit from 12/26/2014 in [place] Office Visit from 05/08/2014 in [place]  PHQ-2 Total Score  0  2  0  0  PHQ-9 Total Score  -  6  -  -       Assessment and Plan: Major depressive disorder, recurrent.  Anxiety disorder NOS.  I review her symptoms, history, current  medication.  Patient has been noncompliant with Lexapro and Lamictal for past few weeks and having depression, irritability, crying spells and anxiety.  Reinforce  medication compliance and recommended in the future if she have any issues getting medication then she should call us immediately.  I also encouraged to see neurologist for her short-term memory, numbness and tingling in her hand.  Patient is not interested in counseling.  Patient do not recall any rash, itching or tremors on the Lamictal.  I will continue Lamictal 100 mg daily however we will consider increasing the dose in the future if needed.  Continue amitriptyline 100 mg at bedtime and Lexapro 20 mg daily.  Discussed medication side effects and benefits.  Recommended to call us back if she has any question, concern or if she feels worsening of the symptom.  Follow-up in 2 months.  Time spent 25 minutes.     Mel Tadros T., MD 05/05/2017, 8:27 AM

## 2017-05-05 NOTE — Progress Notes (Signed)
Pt states she is taking Lexapro but she is currently unable to get it because it was sent to [facility] but she uses [facility]. She would like it to be sent there.

## 2017-05-30 ENCOUNTER — Encounter: Payer: Self-pay | Admitting: Internal Medicine

## 2017-06-29 ENCOUNTER — Other Ambulatory Visit (INDEPENDENT_AMBULATORY_CARE_PROVIDER_SITE_OTHER): Payer: Medicare Other

## 2017-06-29 ENCOUNTER — Encounter: Payer: Self-pay | Admitting: Internal Medicine

## 2017-06-29 ENCOUNTER — Ambulatory Visit (INDEPENDENT_AMBULATORY_CARE_PROVIDER_SITE_OTHER): Payer: Medicare Other | Admitting: Internal Medicine

## 2017-06-29 ENCOUNTER — Encounter: Payer: Self-pay | Admitting: Neurology

## 2017-06-29 VITALS — BP 144/64 | HR 77 | Temp 98.8°F | Ht 62.0 in | Wt 161.0 lb

## 2017-06-29 DIAGNOSIS — Z23 Encounter for immunization: Secondary | ICD-10-CM | POA: Diagnosis not present

## 2017-06-29 DIAGNOSIS — K2100 Gastro-esophageal reflux disease with esophagitis, without bleeding: Secondary | ICD-10-CM

## 2017-06-29 DIAGNOSIS — E113519 Type 2 diabetes mellitus with proliferative diabetic retinopathy with macular edema, unspecified eye: Secondary | ICD-10-CM

## 2017-06-29 DIAGNOSIS — Z794 Long term (current) use of insulin: Secondary | ICD-10-CM

## 2017-06-29 DIAGNOSIS — E118 Type 2 diabetes mellitus with unspecified complications: Secondary | ICD-10-CM

## 2017-06-29 DIAGNOSIS — I10 Essential (primary) hypertension: Secondary | ICD-10-CM

## 2017-06-29 DIAGNOSIS — E038 Other specified hypothyroidism: Secondary | ICD-10-CM | POA: Diagnosis not present

## 2017-06-29 LAB — COMPREHENSIVE METABOLIC PANEL
ALT: 11 U/L (ref 0–35)
AST: 11 U/L (ref 0–37)
Albumin: 4 g/dL (ref 3.5–5.2)
Alkaline Phosphatase: 115 U/L (ref 39–117)
BUN: 16 mg/dL (ref 6–23)
CO2: 25 mEq/L (ref 19–32)
Calcium: 9.6 mg/dL (ref 8.4–10.5)
Chloride: 104 mEq/L (ref 96–112)
Creatinine, Ser: 1.33 mg/dL — ABNORMAL HIGH (ref 0.40–1.20)
GFR: 51.03 mL/min — ABNORMAL LOW (ref >60.00)
Glucose, Bld: 141 mg/dL — ABNORMAL HIGH (ref 70–99)
Potassium: 3.9 mEq/L (ref 3.5–5.1)
Sodium: 140 mEq/L (ref 135–145)
Total Bilirubin: 0.2 mg/dL (ref 0.2–1.2)
Total Protein: 7.4 g/dL (ref 6.0–8.3)

## 2017-06-29 LAB — CBC WITH DIFFERENTIAL/PLATELET
Basophils Absolute: 0.1 10*3/uL (ref 0.0–0.1)
Basophils Relative: 1.2 % (ref 0.0–3.0)
Eosinophils Absolute: 0.3 10*3/uL (ref 0.0–0.7)
Eosinophils Relative: 2.2 % (ref 0.0–5.0)
HCT: 39.1 % (ref 36.0–46.0)
Hemoglobin: 12 g/dL (ref 12.0–15.0)
Lymphocytes Relative: 31 % (ref 12.0–46.0)
Lymphs Abs: 3.7 10*3/uL (ref 0.7–4.0)
MCHC: 30.7 g/dL (ref 30.0–36.0)
MCV: 89.8 fl (ref 78.0–100.0)
Monocytes Absolute: 0.9 10*3/uL (ref 0.1–1.0)
Monocytes Relative: 7.2 % (ref 3.0–12.0)
Neutro Abs: 7 10*3/uL (ref 1.4–7.7)
Neutrophils Relative %: 58.4 % (ref 43.0–77.0)
Platelets: 269 10*3/uL (ref 150.0–400.0)
RBC: 4.36 Mil/uL (ref 3.87–5.11)
RDW: 15.2 % (ref 11.5–15.5)
WBC: 12 10*3/uL — ABNORMAL HIGH (ref 4.0–10.5)

## 2017-06-29 LAB — TSH: TSH: 17.87 u[IU]/mL — ABNORMAL HIGH (ref 0.35–4.50)

## 2017-06-29 LAB — HEMOGLOBIN A1C: Hgb A1c MFr Bld: 10.1 % — ABNORMAL HIGH (ref 4.6–6.5)

## 2017-06-29 MED ORDER — DEXLANSOPRAZOLE 60 MG PO CPDR: 60.0000 mg | DELAYED_RELEASE_CAPSULE | Freq: Every day | ORAL | 1 refills | Status: DC

## 2017-06-29 MED ORDER — LEVOTHYROXINE SODIUM 150 MCG PO TABS: 150.0000 ug | ORAL_TABLET | Freq: Every day | ORAL | 0 refills | Status: DC

## 2017-06-29 NOTE — Progress Notes (Signed)
Subjective:  Patient ID: [facility], female    DOB: Sep 28, 1950  Age: 66 y.o. MRN: [id number]  CC: Weight Gain (Patient is here today C/O weight gain.  Since last visit she has gained 6 lbs. She states that in the past when was on the [place] her blood sugar was doing better and her weight went down.) and Heartburn (Also states that she has been having flare-ups with heartburn and feels like her throat is on fire.  Sometimes what she drinks will come back up in her throat.)   HPI [name] presents for up on heartburn.  She complains of severe heartburn despite taking a PPI and Tums.  She denies odynophagia, dysphagia or weight loss.  In fact she is concerned that she is gaining weight.  She reports that she is compliant with all of her meds for diabetes but does not think her blood sugars have been well controlled.  Outpatient Medications Prior to Visit  Medication Sig Dispense Refill  . albuterol (PROVENTIL HFA;VENTOLIN HFA) 108 (90 Base) MCG/ACT inhaler Inhale 1-2 puffs into the lungs every 6 (six) hours as needed for wheezing or shortness of breath.    Marland Kitchen amitriptyline (ELAVIL) 100 MG tablet Take 1 tablet (100 mg total) by mouth at bedtime. 90 tablet 0  . amLODipine (NORVASC) 2.5 MG tablet Take 1 tablet (2.5 mg total) by mouth daily. 90 tablet 1  . aspirin 325 MG tablet Take 1 tablet (325 mg total) by mouth daily.    Marland Kitchen atorvastatin (LIPITOR) 40 MG tablet TAKE 1 TABLET BY MOUTH ONCE DAILY 90 tablet 1  . Blood Glucose Monitoring Suppl (ACCU-CHEK GUIDE) w/Device KIT 1 Act by Does not apply route 4 (four) times daily - after meals and at bedtime. 2 kit 0  . Cholecalciferol 2000 units TABS Take 1 tablet (2,000 Units total) by mouth daily. 90 tablet 3  . Empagliflozin-Metformin HCl (SYNJARDY) 12-998 MG TABS Take 1 tablet by mouth 2 (two) times daily. 180 tablet 1  . EQ ALLERGY RELIEF 10 MG tablet TAKE 1 TABLET BY MOUTH ONCE DAILY AS NEEDED FOR ALLERGIES 90 tablet 3  . escitalopram (LEXAPRO) 20 MG  tablet Take 1 tablet (20 mg total) by mouth daily. 90 tablet 0  . exenatide (BYETTA 10 MCG PEN) 10 MCG/0.04ML SOPN injection Inject 0.04 mLs (10 mcg total) into the skin 2 (two) times daily with a meal. 7.2 mL 1  . gabapentin (NEURONTIN) 100 MG capsule Take 1 capsule (100 mg total) by mouth 2 (two) times daily. 180 capsule 1  . glucose blood (ACCU-CHEK GUIDE) test strip 1 each by Other route 2 (two) times daily. Use to check blood sugars twice a day 100 each 5  . insulin glargine (LANTUS) 100 UNIT/ML injection Inject 0.6 mLs (60 Units total) into the skin at bedtime. 10 mL 11  . insulin lispro (HUMALOG) 100 UNIT/ML KiwkPen Inject into skin subcutaneously 8 units at breakfast, 4 units at lunch and 30 untis at supper. Pen needles 4x daily. 15 pen 11  . INSULIN SYRINGE .5CC/28G 28G X 1/2" 0.5 ML MISC Give insulin once per night 100 each 12  . lamoTRIgine (LAMICTAL) 100 MG tablet Take 1 tablet (100 mg total) by mouth daily. 90 tablet 0  . Lancets Misc. (ACCU-CHEK FASTCLIX LANCET) KIT 1 Act by Does not apply route 4 (four) times daily. 1 kit 1  . Lancets Thin MISC Use to check blood sugar 4 times per day. 400 each 3  . nebivolol (  BYSTOLIC) 10 MG tablet Take 1 tablet (10 mg total) by mouth daily. 90 tablet 1  . HYDROcodone-acetaminophen (NORCO/VICODIN) 5-325 MG tablet Take 1 tablet by mouth every 8 (eight) hours as needed for moderate pain. 10 tablet 0  . levothyroxine (SYNTHROID, LEVOTHROID) 125 MCG tablet Take 1 tablet (125 mcg total) by mouth daily. 90 tablet 1  . omeprazole (PRILOSEC) 40 MG capsule TAKE 1 CAPSULE BY MOUTH ONCE DAILY 90 capsule 0  . omeprazole (PRILOSEC) 40 MG capsule TAKE 1 CAPSULE BY MOUTH ONCE DAILY 90 capsule 0  . fluconazole (DIFLUCAN) 150 MG tablet TAKE 1 TABLET BY MOUTH ONCE 1 tablet 3   No facility-administered medications prior to visit.     ROS Review of Systems  Constitutional: Positive for unexpected weight change (wt gain). Negative for appetite change, chills,  diaphoresis and fatigue.  HENT: Negative.  Negative for facial swelling, sinus pressure, sore throat, trouble swallowing and voice change.   Eyes: Negative for visual disturbance.  Respiratory: Negative for cough, chest tightness, shortness of breath and wheezing.   Cardiovascular: Negative for chest pain, palpitations and leg swelling.  Gastrointestinal: Negative for abdominal pain, constipation, diarrhea, nausea and vomiting.  Endocrine: Negative.  Negative for cold intolerance and heat intolerance.  Genitourinary: Negative.   Musculoskeletal: Negative.  Negative for back pain, myalgias and neck pain.  Skin: Negative.  Negative for color change.  Allergic/Immunologic: Negative.   Neurological: Negative.  Negative for dizziness and light-headedness.  Hematological: Negative for adenopathy. Does not bruise/bleed easily.  Psychiatric/Behavioral: Negative.     Objective:  BP (!) 144/64 (BP Location: Right Arm, Patient Position: Sitting, Cuff Size: Normal)   Pulse 77   Temp 98.8 F (37.1 C) (Oral)   Ht '5\' 2"'$  (1.575 m)   Wt 161 lb (73 kg)   SpO2 99%   BMI 29.45 kg/m   BP Readings from Last 3 Encounters:  06/29/17 (!) 144/64  01/31/17 (!) 142/74  12/20/16 (!) 170/70    Wt Readings from Last 3 Encounters:  06/29/17 161 lb (73 kg)  01/31/17 162 lb (73.5 kg)  12/20/16 153 lb 8 oz (69.6 kg)    Physical Exam  Constitutional: She is oriented to person, place, and time. No distress.  HENT:  Mouth/Throat: Oropharynx is clear and moist. No oropharyngeal exudate.  Eyes: Conjunctivae are normal. Right eye exhibits no discharge. Left eye exhibits no discharge. No scleral icterus.  Neck: Normal range of motion. Neck supple. No JVD present. No thyromegaly present.  Cardiovascular: Normal rate, regular rhythm and intact distal pulses. Exam reveals no gallop and no friction rub.  No murmur heard. Pulmonary/Chest: Effort normal and breath sounds normal. No respiratory distress. She has no  wheezes. She has no rales. She exhibits no tenderness.  Abdominal: Soft. Bowel sounds are normal. She exhibits no distension and no mass. There is no tenderness. There is no rebound and no guarding.  Musculoskeletal: Normal range of motion. She exhibits no edema or tenderness.  Lymphadenopathy:    She has no cervical adenopathy.  Neurological: She is alert and oriented to person, place, and time.  Skin: Skin is warm and dry. No rash noted. She is not diaphoretic. No erythema. No pallor.  Psychiatric: She has a normal mood and affect. Her behavior is normal. Judgment and thought content normal.  Vitals reviewed.   Lab Results  Component Value Date   WBC 12.0 (H) 06/29/2017   HGB 12.0 Repeated and verified X2. 06/29/2017   HCT 39.1 06/29/2017   PLT  269.0 06/29/2017   GLUCOSE 141 (H) 06/29/2017   CHOL 173 11/24/2016   TRIG 192.0 (H) 11/24/2016   HDL 51.60 11/24/2016   LDLDIRECT 141 (H) 03/04/2010   LDLCALC 83 11/24/2016   ALT 11 06/29/2017   AST 11 06/29/2017   NA 140 06/29/2017   K 3.9 06/29/2017   CL 104 06/29/2017   CREATININE 1.33 (H) 06/29/2017   BUN 16 06/29/2017   CO2 25 06/29/2017   TSH 17.87 (H) 06/29/2017   INR 1.10 06/12/2015   HGBA1C 10.1 (H) 06/29/2017   MICROALBUR <0.7 12/20/2016    Ct Head Wo Contrast  Result Date: 12/11/2016 CLINICAL DATA:  Passed out, dizziness and nausea. EXAM: CT HEAD WITHOUT CONTRAST TECHNIQUE: Contiguous axial images were obtained from the base of the skull through the vertex without intravenous contrast. COMPARISON:  06/12/2015 MRI and CT FINDINGS: Brain: Small focus of encephalomalacia along the posterior left parietal lobe consistent with remote left MCA distribution infarct. No acute intracranial hemorrhage, midline shift or edema. No large vascular territory infarction. No intra-axial mass nor extra-axial fluid. No ventriculomegaly. No effacement of the basal cisterns. Fourth ventricle is midline. No acute cervical abnormality. Vascular:  No hyperdense vessel or unexpected calcification. Skull: Normal. Negative for fracture or focal lesion. Sinuses/Orbits: No acute finding. Small mucous retention cyst in the sphenoid. Other: None. IMPRESSION: 1. Small focus of encephalomalacia in the high left posterior parietal lobe from remote MCA distribution infarct. 2. No acute intracranial abnormality. Electronically Signed   By: Ashley Royalty M.D.   On: 12/11/2016 18:05   Dg Chest Port 1 View  Result Date: 12/11/2016 CLINICAL DATA:  Sepsis EXAM: PORTABLE CHEST 1 VIEW COMPARISON:  06/12/2015 FINDINGS: The heart size and mediastinal contours are within normal limits. Both lungs are clear. The visualized skeletal structures are unremarkable. IMPRESSION: No active disease. Electronically Signed   By: Inez Catalina M.D.   On: 12/11/2016 19:28    Assessment & Plan:   Naliah was seen today for weight gain and heartburn.  Diagnoses and all orders for this visit:  Need for influenza vaccination -     Flu Vaccine QUAD 6+ mos PF IM (Fluarix Quad PF)  Other specified hypothyroidism- Her TSH is elevated and she is symptomatic so I have asked her to increase her levothyroxine dose. -     TSH; Future -     levothyroxine (SYNTHROID) 150 MCG tablet; Take 1 tablet (150 mcg total) daily before breakfast by mouth.  Type 2 diabetes mellitus with complication, with long-term current use of insulin (Show Low)- her A1c remains elevated at 10.1%.  Will continue the current meds for diabetes and I have asked her to follow-up with her endocrinologist. -     Comprehensive metabolic panel; Future -     Hemoglobin A1c; Future -     Ambulatory referral to Ophthalmology -     Ambulatory referral to Endocrinology  Essential hypertension, benign- Her blood pressure is not adequately well controlled.  Her electrolytes are normal and her renal function is stable.  Will continue amlodipine and bystolic at the current doses. Will add an ARB for renal protection -     Comprehensive  metabolic panel; Future  Gastroesophageal reflux disease with esophagitis- Will try to control her symptoms with a more potent PPI. -     CBC with Differential/Platelet; Future -     dexlansoprazole (DEXILANT) 60 MG capsule; Take 1 capsule (60 mg total) daily by mouth.  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 Heldman's HYDROcodone-acetaminophen, levothyroxine, omeprazole, omeprazole, and fluconazole. I am also having her start on dexlansoprazole and levothyroxine. Additionally, I am having her maintain her aspirin, INSULIN SYRINGE .5CC/28G, albuterol, Cholecalciferol, ACCU-CHEK GUIDE, glucose blood, ACCU-CHEK FASTCLIX LANCET, Lancets Thin, nebivolol, exenatide, insulin glargine, Empagliflozin-Metformin HCl, gabapentin, amLODipine, EQ ALLERGY RELIEF, atorvastatin, insulin lispro, lamoTRIgine, escitalopram, and amitriptyline.  Meds ordered this encounter  Medications  . dexlansoprazole (DEXILANT) 60 MG capsule    Sig: Take 1 capsule (60 mg total) daily by mouth.    Dispense:  90 capsule    Refill:  1  . levothyroxine (SYNTHROID) 150 MCG tablet    Sig: Take 1 tablet (150 mcg total) daily before breakfast by mouth.    Dispense:  90 tablet    Refill:  0     Follow-up: Return in about 6 weeks (around 08/10/2017).  Scarlette Calico, MD

## 2017-06-29 NOTE — Patient Instructions (Signed)
Heartburn Heartburn is a type of pain or discomfort that can happen in the throat or chest. It is often described as a burning pain. It may also cause a bad taste in the mouth. Heartburn may feel worse when you lie down or bend over, and it is often worse at night. Heartburn may be caused by stomach contents that move back up into the esophagus (reflux). Follow these instructions at home: Take these actions to decrease your discomfort and to help avoid complications. Diet  Follow a diet as recommended by your health care provider. This may involve avoiding foods and drinks such as: ? Coffee and tea (with or without caffeine). ? Drinks that contain alcohol. ? Energy drinks and sports drinks. ? Carbonated drinks or sodas. ? Chocolate and cocoa. ? Peppermint and mint flavorings. ? Garlic and onions. ? Horseradish. ? Spicy and acidic foods, including peppers, chili powder, curry powder, vinegar, hot sauces, and barbecue sauce. ? Citrus fruit juices and citrus fruits, such as oranges, lemons, and limes. ? Tomato-based foods, such as red sauce, chili, salsa, and pizza with red sauce. ? Fried and fatty foods, such as donuts, french fries, potato chips, and high-fat dressings. ? High-fat meats, such as hot dogs and fatty cuts of red and white meats, such as rib eye steak, sausage, ham, and bacon. ? High-fat dairy items, such as whole milk, butter, and cream cheese.  Eat small, frequent meals instead of large meals.  Avoid drinking large amounts of liquid with your meals.  Avoid eating meals during the 2-3 hours before bedtime.  Avoid lying down right after you eat.  Do not exercise right after you eat. General instructions  Pay attention to any changes in your symptoms.  Take over-the-counter and prescription medicines only as told by your health care provider. Do not take aspirin, ibuprofen, or other NSAIDs unless your health care provider told you to do so.  Do not use any tobacco  products, including cigarettes, chewing tobacco, and e-cigarettes. If you need help quitting, ask your health care provider.  Wear loose-fitting clothing. Do not wear anything tight around your waist that causes pressure on your abdomen.  Raise (elevate) the head of your bed about 6 inches (15 cm).  Try to reduce your stress, such as with yoga or meditation. If you need help reducing stress, ask your health care provider.  If you are overweight, reduce your weight to an amount that is healthy for you. Ask your health care provider for guidance about a safe weight loss goal.  Keep all follow-up visits as told by your health care provider. This is important. Contact a health care provider if:  You have new symptoms.  You have unexplained weight loss.  You have difficulty swallowing, or it hurts to swallow.  You have wheezing or a persistent cough.  Your symptoms do not improve with treatment.  You have frequent heartburn for more than two weeks. Get help right away if:  You have pain in your arms, neck, jaw, teeth, or back.  You feel sweaty, dizzy, or light-headed.  You have chest pain or shortness of breath.  You vomit and your vomit looks like blood or coffee grounds.  Your stool is bloody or black. This information is not intended to replace advice given to you by your health care provider. Make sure you discuss any questions you have with your health care provider. Document Released: 12/19/2008 Document Revised: 01/08/2016 Document Reviewed: 11/27/2014 Elsevier Interactive Patient Education  2017 Elsevier   Inc.  

## 2017-06-30 MED ORDER — TELMISARTAN 40 MG PO TABS: 40.0000 mg | ORAL_TABLET | Freq: Every day | ORAL | 1 refills | Status: DC

## 2017-07-04 ENCOUNTER — Encounter: Payer: Self-pay | Admitting: Internal Medicine

## 2017-07-06 ENCOUNTER — Ambulatory Visit: Payer: Medicare Other | Admitting: Neurology

## 2017-07-11 ENCOUNTER — Ambulatory Visit (HOSPITAL_COMMUNITY): Payer: Self-pay | Admitting: Psychiatry

## 2017-07-15 ENCOUNTER — Encounter (HOSPITAL_COMMUNITY): Payer: Self-pay | Admitting: Psychiatry

## 2017-07-15 ENCOUNTER — Ambulatory Visit (INDEPENDENT_AMBULATORY_CARE_PROVIDER_SITE_OTHER): Payer: Medicare Other | Admitting: Psychiatry

## 2017-07-15 DIAGNOSIS — F419 Anxiety disorder, unspecified: Secondary | ICD-10-CM

## 2017-07-15 DIAGNOSIS — F331 Major depressive disorder, recurrent, moderate: Secondary | ICD-10-CM

## 2017-07-15 DIAGNOSIS — Z87891 Personal history of nicotine dependence: Secondary | ICD-10-CM

## 2017-07-15 DIAGNOSIS — Z79899 Other long term (current) drug therapy: Secondary | ICD-10-CM | POA: Diagnosis not present

## 2017-07-15 DIAGNOSIS — Z8673 Personal history of transient ischemic attack (TIA), and cerebral infarction without residual deficits: Secondary | ICD-10-CM | POA: Diagnosis not present

## 2017-07-15 MED ORDER — ESCITALOPRAM OXALATE 20 MG PO TABS: 20.0000 mg | ORAL_TABLET | Freq: Every day | ORAL | 0 refills | Status: DC

## 2017-07-15 MED ORDER — AMITRIPTYLINE HCL 100 MG PO TABS: 100.0000 mg | ORAL_TABLET | Freq: Every day | ORAL | 0 refills | Status: DC

## 2017-07-15 MED ORDER — LAMOTRIGINE 150 MG PO TABS: 150.0000 mg | ORAL_TABLET | Freq: Every day | ORAL | 0 refills | Status: DC

## 2017-07-15 NOTE — Progress Notes (Signed)
[facility] MD/PA/NP OP Progress Note  07/15/2017 8:51 AM Sonia Skinner  MRN:  [id number]  Chief Complaint: I am very concerned about my memory.  HPI: Sonia Skinner came for her follow-up appointment.  She endorsed improvement in her depression but she is very concerned about her centration and short-term memory.  We have recommended to see neurologist but she mentioned that she has to pay remaining balance to them and she could not make the appointment.  She went [name] to her nephew to attend Thanksgiving.  She mentioned there is a lot of people and she was very nervous but she was happy to see the family.  Patient has a lot of somatic complaints including back pain, joint pain, numbness, memory problems and poor attention and concentration.  She endorses that sometimes she has difficulty working because of the memory.  She is working as a Oceanographer at [employer].  Patient has a history of noncompliance with medication.  However now she is taking Lamictal Lexapro and some nights amitriptyline which is helping her depression.  She denies any recent crying spells or any feeling of hopelessness or worthlessness.  Sometimes she admitted not take the Elavil because she forgets.  She lives with her sister and together they are renting the house but she is under a lot of pressure from the sister to moving their parents house.  She does not want to move in because it is a small house.  Her parents are deceased.  Patient had a stroke which causes residual numbness tingling and memory impairment.  Her appetite is okay.  Her energy level is fair.  Recently she seen her primary care physician and she had blood work.  Her hemoglobin A1c dropped from the past but is still high.  She is trying to get a better control on her blood sugar.  Patient denies drinking alcohol or using any illegal substances.  She is not interested in counseling but agreed that if needed she will think about it next year.  Patient denies any  agitation, self abusive behavior, suicidal thoughts or homicidal thoughts.  Visit Diagnosis:    ICD-10-CM   1. Moderate episode of recurrent major depressive disorder (HCC) F33.1 lamoTRIgine (LAMICTAL) 150 MG tablet    escitalopram (LEXAPRO) 20 MG tablet    amitriptyline (ELAVIL) 100 MG tablet    Past Psychiatric History: Reviewed. Patient reported taking antidepressants the past 20 years.  She has history of taking overdose and admission at [facility].  She was seeing a physician at [facility] and given the Strattera, Adderall, Zoloft, Celexa.  Patient denies any history of psychosis, mania or any hallucination.  She was never tested for ADD.  In 2001 she moved to [facility] and she saw psychiatrist there.  She was given Wellbutrin to stop smoking.  Patient denies any history of sexual, verbal and physical and emotional abuse.  Past Medical History:  Past Medical History:  Diagnosis Date  . Anxiety   . Carotid artery occlusion   . Complication of anesthesia    ALLERY TO ESTER BASE  . Depression    early 16s  . Diabetes mellitus    INSULIN DEPENDENT  . GERD (gastroesophageal reflux disease)   . Headache   . Hypertension   . Stroke [facility]) March 2015    left MCA infarct  . Thyroid disease     Past Surgical History:  Procedure Laterality Date  . ABDOMINAL HYSTERECTOMY    . ANTERIOR FIXATION AND POSTERIOR  MICRODISCECTOMY CERVICAL SPINE  1999  . CAROTID ENDARTERECTOMY Left 11-04-13   cea  . ENDARTERECTOMY Left 11/04/2013    Family Psychiatric History: Reviewed.  Family History:  Family History  Problem Relation Age of Onset  . Diabetes Mother   . Heart disease Mother        Before age 33  . Cancer Father        Lung  . Hypertension Sister   . Diabetes Sister   . Diabetes Sister   . Diabetes Sister     Social History:  Social History   Socioeconomic History  . Marital status: Legally Separated    Spouse name: None  . Number of children: 0  .  Years of education: College  . Highest education level: None  Social Needs  . Financial resource strain: None  . Food insecurity - worry: None  . Food insecurity - inability: None  . Transportation needs - medical: None  . Transportation needs - non-medical: None  Occupational History  . Occupation: [employer]    Employer: [employer]  Tobacco Use  . Smoking status: Former Smoker    Packs/day: 0.20    Years: 20.00    Pack years: 4.00    Types: Cigarettes    Last attempt to quit: 11/01/2013    Years since quitting: 3.7  . Smokeless tobacco: Never Used  Substance and Sexual Activity  . Alcohol use: No    Alcohol/week: 0.0 oz  . Drug use: No  . Sexual activity: Yes  Other Topics Concern  . None  Social History Narrative   Patient lives at home with sister.   Caffeine Use: 2 cups daily; sodas occasionally    Allergies:  Allergies  Allergen Reactions  . Anesthetics, Ester Anaphylaxis   Recent Results (from the past 2160 hour(s))  TSH     Status: Abnormal   Collection Time: 06/29/17  4:10 PM  Result Value Ref Range   TSH 17.87 (H) 0.35 - 4.50 uIU/mL  Hemoglobin A1c     Status: Abnormal   Collection Time: 06/29/17  4:10 PM  Result Value Ref Range   Hgb A1c MFr Bld 10.1 (H) 4.6 - 6.5 %    Comment: Glycemic Control Guidelines for People with Diabetes:Non Diabetic:  <6%Goal of Therapy: <7%Additional Action Suggested:  >8%   CBC with Differential/Platelet     Status: Abnormal   Collection Time: 06/29/17  4:10 PM  Result Value Ref Range   WBC 12.0 (H) 4.0 - 10.5 K/uL   RBC 4.36 3.87 - 5.11 Mil/uL   Hemoglobin 12.0 Repeated and verified X2. 12.0 - 15.0 g/dL   HCT 39.1 36.0 - 46.0 %   MCV 89.8 78.0 - 100.0 fl   MCHC 30.7 30.0 - 36.0 g/dL   RDW 15.2 11.5 - 15.5 %   Platelets 269.0 150.0 - 400.0 K/uL   Neutrophils Relative % 58.4 43.0 - 77.0 %   Lymphocytes Relative 31.0 12.0 - 46.0 %   Monocytes Relative 7.2 3.0 - 12.0 %   Eosinophils Relative 2.2 0.0 - 5.0 %   Basophils  Relative 1.2 0.0 - 3.0 %   Neutro Abs 7.0 1.4 - 7.7 K/uL   Lymphs Abs 3.7 0.7 - 4.0 K/uL   Monocytes Absolute 0.9 0.1 - 1.0 K/uL   Eosinophils Absolute 0.3 0.0 - 0.7 K/uL   Basophils Absolute 0.1 0.0 - 0.1 K/uL  Comprehensive metabolic panel     Status: Abnormal   Collection Time: 06/29/17  4:10 PM  Result Value Ref Range   Sodium 140 135 - 145 mEq/L   Potassium 3.9 3.5 - 5.1 mEq/L   Chloride 104 96 - 112 mEq/L   CO2 25 19 - 32 mEq/L   Glucose, Bld 141 (H) 70 - 99 mg/dL   BUN 16 6 - 23 mg/dL   Creatinine, Ser 1.33 (H) 0.40 - 1.20 mg/dL   Total Bilirubin 0.2 0.2 - 1.2 mg/dL   Alkaline Phosphatase 115 39 - 117 U/L   AST 11 0 - 37 U/L   ALT 11 0 - 35 U/L   Total Protein 7.4 6.0 - 8.3 g/dL   Albumin 4.0 3.5 - 5.2 g/dL   Calcium 9.6 8.4 - 10.5 mg/dL   GFR 51.03 (L) >89.21 mL/min   Metabolic Disorder Labs: Lab Results  Component Value Date   HGBA1C 10.1 (H) 06/29/2017   MPG 255 (H) 11/02/2013   MPG 249 (H) 11/01/2013   No results found for: PROLACTIN Lab Results  Component Value Date   CHOL 173 11/24/2016   TRIG 192.0 (H) 11/24/2016   HDL 51.60 11/24/2016   CHOLHDL 3 11/24/2016   VLDL 38.4 11/24/2016   LDLCALC 83 11/24/2016   LDLCALC 105 (H) 09/18/2014   Lab Results  Component Value Date   TSH 17.87 (H) 06/29/2017   TSH 2.09 12/20/2016    Therapeutic Level Labs: No results found for: LITHIUM No results found for: VALPROATE No components found for:  CBMZ  Current Medications: Current Outpatient Medications  Medication Sig Dispense Refill  . albuterol (PROVENTIL HFA;VENTOLIN HFA) 108 (90 Base) MCG/ACT inhaler Inhale 1-2 puffs into the lungs every 6 (six) hours as needed for wheezing or shortness of breath.    Marland Kitchen amitriptyline (ELAVIL) 100 MG tablet Take 1 tablet (100 mg total) by mouth at bedtime. 90 tablet 0  . amLODipine (NORVASC) 2.5 MG tablet Take 1 tablet (2.5 mg total) by mouth daily. 90 tablet 1  . aspirin 325 MG tablet Take 1 tablet (325 mg total) by mouth  daily.    Marland Kitchen atorvastatin (LIPITOR) 40 MG tablet TAKE 1 TABLET BY MOUTH ONCE DAILY 90 tablet 1  . Blood Glucose Monitoring Suppl (ACCU-CHEK GUIDE) w/Device KIT 1 Act by Does not apply route 4 (four) times daily - after meals and at bedtime. 2 kit 0  . Cholecalciferol 2000 units TABS Take 1 tablet (2,000 Units total) by mouth daily. 90 tablet 3  . dexlansoprazole (DEXILANT) 60 MG capsule Take 1 capsule (60 mg total) daily by mouth. 90 capsule 1  . Empagliflozin-Metformin HCl (SYNJARDY) 12-998 MG TABS Take 1 tablet by mouth 2 (two) times daily. 180 tablet 1  . EQ ALLERGY RELIEF 10 MG tablet TAKE 1 TABLET BY MOUTH ONCE DAILY AS NEEDED FOR ALLERGIES 90 tablet 3  . escitalopram (LEXAPRO) 20 MG tablet Take 1 tablet (20 mg total) by mouth daily. 90 tablet 0  . exenatide (BYETTA 10 MCG PEN) 10 MCG/0.04ML SOPN injection Inject 0.04 mLs (10 mcg total) into the skin 2 (two) times daily with a meal. 7.2 mL 1  . gabapentin (NEURONTIN) 100 MG capsule Take 1 capsule (100 mg total) by mouth 2 (two) times daily. 180 capsule 1  . glucose blood (ACCU-CHEK GUIDE) test strip 1 each by Other route 2 (two) times daily. Use to check blood sugars twice a day 100 each 5  . insulin glargine (LANTUS) 100 UNIT/ML injection Inject 0.6 mLs (60 Units total) into the skin at bedtime. 10 mL 11  . insulin lispro (  HUMALOG) 100 UNIT/ML KiwkPen Inject into skin subcutaneously 8 units at breakfast, 4 units at lunch and 30 untis at supper. Pen needles 4x daily. 15 pen 11  . INSULIN SYRINGE .5CC/28G 28G X 1/2" 0.5 ML MISC Give insulin once per night 100 each 12  . lamoTRIgine (LAMICTAL) 100 MG tablet Take 1 tablet (100 mg total) by mouth daily. 90 tablet 0  . Lancets Misc. (ACCU-CHEK FASTCLIX LANCET) KIT 1 Act by Does not apply route 4 (four) times daily. 1 kit 1  . Lancets Thin MISC Use to check blood sugar 4 times per day. 400 each 3  . levothyroxine (SYNTHROID) 150 MCG tablet Take 1 tablet (150 mcg total) daily before breakfast by mouth.  90 tablet 0  . nebivolol (BYSTOLIC) 10 MG tablet Take 1 tablet (10 mg total) by mouth daily. 90 tablet 1  . telmisartan (MICARDIS) 40 MG tablet Take 1 tablet (40 mg total) daily by mouth. 90 tablet 1   No current facility-administered medications for this visit.      Musculoskeletal: Strength & Muscle Tone: within normal limits Gait & Station: normal Patient leans: N/A  Psychiatric Specialty Exam: Review of Systems  Constitutional: Negative.   HENT: Negative.   Musculoskeletal: Positive for back pain and joint pain.  Skin: Negative for itching and rash.  Neurological: Positive for tingling.  Psychiatric/Behavioral: The patient is nervous/anxious and has insomnia.     Blood pressure 138/72, pulse 80, height _0  (1.575 m), weight 157 lb 3.2 oz (71.3 kg).Body mass index is 28.75 kg/m.  General Appearance: Casual  Eye Contact:  Good  Speech:  Slow  Volume:  Normal  Mood:  Dysphoric  Affect:  Congruent  Thought Process:  Goal Directed  Orientation:  Full (Time, Place, and Person)  Thought Content: Rumination   Suicidal Thoughts:  No  Homicidal Thoughts:  No  Memory:  Immediate;   Fair Recent;   Fair Remote;   Fair  Judgement:  Fair  Insight:  Good  Psychomotor Activity:  Decreased  Concentration:  Concentration: Fair and Attention Span: Fair  Recall:  Good  Fund of Knowledge: Good  Language: Good  Akathisia:  No  Handed:  Right  AIMS (if indicated): not done  Assets:  Communication Skills Desire for Improvement Housing Transportation  ADL's:  Intact  Cognition: Impaired,  Mild  Sleep:  Fair   Screenings: GAD-7     Office Visit from 08/14/2015 in [place]  Total GAD-7 Score  7    PHQ2-9     Office Visit from 06/29/2017 in [place] Office Visit from 11/20/2015 in [place] Office Visit from 08/14/2015 in [place] Office Visit from  12/26/2014 in [place] Office Visit from 05/08/2014 in [place]  PHQ-2 Total Score  0  0  2  0  0  PHQ-9 Total Score  No data  No data  6  No data  No data       Assessment and Plan: Major depressive disorder, recurrent.  Anxiety disorder NOS.  I reviewed records from other provider including blood work results including hemoglobin A1c, CBC and basic chemistry.  Her TSH is high and she is in the process of getting appointment to see endocrinologist.  Her creatinine is 1.33.  One more time and I encouraged her to see the neurologist for memory impairment and she agreed  to schedule the appointment.  She is not interested in counseling.  Since she started Lamictal her depression is better.  She has no rash, itching, tremors or shakes she will continue amitriptyline I recommended to try Lamictal 150 mg daily, continue Lexapro 20 mg daily and amitriptyline 100 mg at bedtime.  Reinforced medication compliance.  Discussed healthy lifestyle and watch her calorie intake.  Recommended to call us back if she has any question, concern if she feels worsening of the symptoms.  I do believe her memory impairment is a residual impairment from the stroke and she should see a neurologist.  Time spent 25 minutes.  I will see her again in 3 months.  Discussed safety concerns at any time having active suicidal thoughts or homicidal thought that she need to call 911 or go to local emergency room.   Kathlee Nations, MD 07/15/2017, 8:51 AM

## 2017-08-05 ENCOUNTER — Other Ambulatory Visit (HOSPITAL_COMMUNITY): Payer: Self-pay | Admitting: Psychiatry

## 2017-08-05 ENCOUNTER — Other Ambulatory Visit: Payer: Self-pay | Admitting: Internal Medicine

## 2017-08-05 DIAGNOSIS — F331 Major depressive disorder, recurrent, moderate: Secondary | ICD-10-CM

## 2017-08-05 DIAGNOSIS — E113519 Type 2 diabetes mellitus with proliferative diabetic retinopathy with macular edema, unspecified eye: Secondary | ICD-10-CM

## 2017-08-05 DIAGNOSIS — Z794 Long term (current) use of insulin: Principal | ICD-10-CM

## 2017-08-05 DIAGNOSIS — G43011 Migraine without aura, intractable, with status migrainosus: Secondary | ICD-10-CM

## 2017-08-26 ENCOUNTER — Other Ambulatory Visit: Payer: Self-pay | Admitting: Internal Medicine

## 2017-09-10 ENCOUNTER — Emergency Department (HOSPITAL_BASED_OUTPATIENT_CLINIC_OR_DEPARTMENT_OTHER): Payer: Medicare Other

## 2017-09-10 ENCOUNTER — Emergency Department (HOSPITAL_BASED_OUTPATIENT_CLINIC_OR_DEPARTMENT_OTHER)
Admission: EM | Admit: 2017-09-10 | Discharge: 2017-09-10 | Disposition: A | Discharge status: Home / Self Care | Payer: Medicare Other | Attending: Emergency Medicine | Admitting: Emergency Medicine

## 2017-09-10 ENCOUNTER — Other Ambulatory Visit: Payer: Self-pay

## 2017-09-10 ENCOUNTER — Encounter (HOSPITAL_BASED_OUTPATIENT_CLINIC_OR_DEPARTMENT_OTHER): Payer: Self-pay | Admitting: Emergency Medicine

## 2017-09-10 DIAGNOSIS — I10 Essential (primary) hypertension: Secondary | ICD-10-CM | POA: Insufficient documentation

## 2017-09-10 DIAGNOSIS — Z794 Long term (current) use of insulin: Secondary | ICD-10-CM | POA: Diagnosis not present

## 2017-09-10 DIAGNOSIS — E039 Hypothyroidism, unspecified: Secondary | ICD-10-CM | POA: Insufficient documentation

## 2017-09-10 DIAGNOSIS — Z79899 Other long term (current) drug therapy: Secondary | ICD-10-CM | POA: Diagnosis not present

## 2017-09-10 DIAGNOSIS — Z8673 Personal history of transient ischemic attack (TIA), and cerebral infarction without residual deficits: Secondary | ICD-10-CM | POA: Insufficient documentation

## 2017-09-10 DIAGNOSIS — Y999 Unspecified external cause status: Secondary | ICD-10-CM | POA: Insufficient documentation

## 2017-09-10 DIAGNOSIS — E119 Type 2 diabetes mellitus without complications: Secondary | ICD-10-CM | POA: Diagnosis not present

## 2017-09-10 DIAGNOSIS — S82832A Other fracture of upper and lower end of left fibula, initial encounter for closed fracture: Secondary | ICD-10-CM | POA: Insufficient documentation

## 2017-09-10 DIAGNOSIS — Z7982 Long term (current) use of aspirin: Secondary | ICD-10-CM | POA: Diagnosis not present

## 2017-09-10 DIAGNOSIS — W1781XA Fall down embankment (hill), initial encounter: Secondary | ICD-10-CM | POA: Insufficient documentation

## 2017-09-10 DIAGNOSIS — Y929 Unspecified place or not applicable: Secondary | ICD-10-CM | POA: Insufficient documentation

## 2017-09-10 DIAGNOSIS — Y939 Activity, unspecified: Secondary | ICD-10-CM | POA: Diagnosis not present

## 2017-09-10 DIAGNOSIS — Z87891 Personal history of nicotine dependence: Secondary | ICD-10-CM | POA: Diagnosis not present

## 2017-09-10 DIAGNOSIS — S82435A Nondisplaced oblique fracture of shaft of left fibula, initial encounter for closed fracture: Secondary | ICD-10-CM | POA: Diagnosis not present

## 2017-09-10 DIAGNOSIS — S99912A Unspecified injury of left ankle, initial encounter: Secondary | ICD-10-CM | POA: Diagnosis present

## 2017-09-10 MED ORDER — OXYCODONE-ACETAMINOPHEN 5-325 MG PO TABS
1.0000 | ORAL_TABLET | Freq: Once | ORAL | Status: AC
  Administered 2017-09-10: 1 via ORAL
  Filled 2017-09-10: qty 1

## 2017-09-10 MED ORDER — OXYCODONE-ACETAMINOPHEN 5-325 MG PO TABS: 1.0000 | ORAL_TABLET | Freq: Four times a day (QID) | ORAL | 0 refills | Status: DC | PRN

## 2017-09-10 NOTE — ED Notes (Signed)
Given Diet Ginger Ale to drink.

## 2017-09-10 NOTE — ED Triage Notes (Signed)
L ankle pain after slipping outside on Wednesday. Swelling noted.

## 2017-09-10 NOTE — ED Notes (Signed)
Pt states her foot feels numb. Lower extremities elevated on a chair. Ice pack reapplied. Updated on delays.

## 2017-09-10 NOTE — ED Notes (Signed)
Pt states

## 2017-09-10 NOTE — Discharge Instructions (Signed)
Please remain in cam walker boot until you are seen by orthopedics on Monday, please use crutches or walker and do not bear weight on your left foot.  Dr. Mardelle Matte will see you on Monday.  You may take oxycodone as needed for pain, although please be careful with this medication as it can make you drowsy.  Continue elevating the foot and using ice.  If you have significantly worsening pain or unable to move her toes have numbness, or noticed that her toes have become pale please return to the ED immediately for reevaluation.

## 2017-09-10 NOTE — ED Provider Notes (Signed)
[facility] EMERGENCY DEPARTMENT Provider Note   CSN: [id number] Arrival date & time: 09/10/17  1138     History   Chief Complaint Chief Complaint  Patient presents with  . Ankle Pain    HPI  Sonia Skinner is a 67 y.o. Female history of hypertension, diabetes, GERD, CAD and previous stroke, who presents to the ED for evaluation of ankle pain.  Patient reports she was walking down a hill on Wednesday and slipped hurting her ankle, patient denies any other injuries from the fall patient did not hit her head, no loss of consciousness.  Patient denies any pain in the hips or knees.  Patient reports since then she has had pain and swelling primarily over the lateral aspect of the ankle.  Patient has been taking Tylenol with no improvement so presents today for evaluation.  Patient reports some occasional tingling over her toes, no numbness or weakness.  Patient has been weightbearing on the foot for the past few days but with significant pain, started using a cane given to her by a friend today.       Past Medical History:  Diagnosis Date  . Anxiety   . Carotid artery occlusion   . Complication of anesthesia    ALLERY TO ESTER BASE  . Depression    early 70s  . Diabetes mellitus    INSULIN DEPENDENT  . GERD (gastroesophageal reflux disease)   . Headache   . Hypertension   . Stroke [facility]) March 2015    left MCA infarct  . Thyroid disease     Patient Active Problem List   Diagnosis Date Noted  . Intractable migraine without aura and with status migrainosus 11/24/2016  . Left thyroid nodule 05/05/2016  . GERD (gastroesophageal reflux disease) 03/08/2016  . Major depressive disorder, recurrent episode ([place]) 02/20/2016  . Occlusion and stenosis of carotid artery with cerebral infarction 11/25/2013  . Acute ischemic left MCA stroke ([place]) 11/02/2013  . Vitamin D deficiency 10/19/2013  . Tobacco abuse 07/31/2013  . Essential hypertension, benign 04/20/2013  . Type II  diabetes mellitus with manifestations ([place]) 04/20/2013  . Hypothyroidism 04/20/2013  . Hyperlipidemia with target LDL less than 70 04/20/2013    Past Surgical History:  Procedure Laterality Date  . ABDOMINAL HYSTERECTOMY    . ANTERIOR FIXATION AND POSTERIOR MICRODISCECTOMY CERVICAL SPINE  1999  . CAROTID ENDARTERECTOMY Left 11-04-13   cea  . ENDARTERECTOMY Left 11/04/2013    OB History    No data available       Home Medications    Prior to Admission medications   Medication Sig Start Date End Date Taking? Authorizing Provider  albuterol (PROVENTIL HFA;VENTOLIN HFA) 108 (90 Base) MCG/ACT inhaler Inhale 1-2 puffs into the lungs every 6 (six) hours as needed for wheezing or shortness of breath.    [provider]  amitriptyline (ELAVIL) 100 MG tablet Take 1 tablet (100 mg total) by mouth at bedtime. 07/15/17   Arfeen, Arlyce Harman, MD  amLODipine (NORVASC) 2.5 MG tablet Take 1 tablet (2.5 mg total) by mouth daily. 02/07/17   [name], MD  aspirin 325 MG tablet Take 1 tablet (325 mg total) by mouth daily. 11/06/13   Samella Parr, NP  atorvastatin (LIPITOR) 40 MG tablet TAKE 1 TABLET BY MOUTH ONCE DAILY 04/20/17   Renato Shin, MD  Blood Glucose Monitoring Suppl (ACCU-CHEK GUIDE) w/Device KIT 1 Act by Does not apply route 4 (four) times daily - after meals and at  bedtime. 12/20/16   [name], MD  Cholecalciferol 2000 units TABS Take 1 tablet (2,000 Units total) by mouth daily. 11/25/16   [name], MD  dexlansoprazole (DEXILANT) 60 MG capsule Take 1 capsule (60 mg total) daily by mouth. 06/29/17   [name], MD  Empagliflozin-Metformin HCl (SYNJARDY) 12-998 MG TABS Take 1 tablet by mouth 2 (two) times daily. 08/05/17   [name], MD  EQ ALLERGY RELIEF 10 MG tablet TAKE 1 TABLET BY MOUTH ONCE DAILY AS NEEDED FOR ALLERGIES 03/03/17   [name], MD  escitalopram (LEXAPRO) 20 MG tablet Take 1 tablet (20 mg total) by mouth daily. 07/15/17 07/15/18  Arfeen,  Arlyce Harman, MD  exenatide (BYETTA 10 MCG PEN) 10 MCG/0.04ML SOPN injection Inject 0.04 mLs (10 mcg total) into the skin 2 (two) times daily with a meal. 01/31/17   [name], MD  gabapentin (NEURONTIN) 100 MG capsule Take 1 capsule (100 mg total) by mouth 2 (two) times daily. 08/05/17   [name], MD  glucose blood test strip USE TO CHECK BLOOD SUGARS TWICE DAILY 08/26/17   [name], MD  insulin glargine (LANTUS) 100 UNIT/ML injection Inject 0.6 mLs (60 Units total) into the skin at bedtime. 01/31/17   [name], MD  insulin lispro (HUMALOG) 100 UNIT/ML KiwkPen Inject into skin subcutaneously 8 units at breakfast, 4 units at lunch and 30 untis at supper. Pen needles 4x daily. 04/26/17   Renato Shin, MD  INSULIN SYRINGE .5CC/28G 28G X 1/2" 0.5 ML MISC Give insulin once per night 08/14/15   Lance Bosch, NP  lamoTRIgine (LAMICTAL) 150 MG tablet Take 1 tablet (150 mg total) by mouth daily. 07/15/17   Arfeen, Arlyce Harman, MD  Lancets Misc. (ACCU-CHEK FASTCLIX LANCET) KIT 1 Act by Does not apply route 4 (four) times daily. 01/15/17   [name], MD  Lancets Thin MISC Use to check blood sugar 4 times per day. 01/19/17   [name], MD  levothyroxine (SYNTHROID) 150 MCG tablet Take 1 tablet (150 mcg total) daily before breakfast by mouth. 06/29/17   [name], MD  nebivolol (BYSTOLIC) 10 MG tablet Take 1 tablet (10 mg total) by mouth daily. 01/31/17   [name], MD  oxyCODONE-acetaminophen (PERCOCET) 5-325 MG tablet Take 1 tablet by mouth every 6 (six) hours as needed. 09/10/17   Jacqlyn Larsen, PA-C  telmisartan (MICARDIS) 40 MG tablet Take 1 tablet (40 mg total) daily by mouth. 06/30/17   [name], MD    Family History Family History  Problem Relation Age of Onset  . Diabetes Mother   . Heart disease Mother        Before age 32  . Cancer Father        Lung  . Hypertension Sister   . Diabetes Sister   . Diabetes Sister   . Diabetes Sister     Social  History Social History   Tobacco Use  . Smoking status: Former Smoker    Packs/day: 0.20    Years: 20.00    Pack years: 4.00    Types: Cigarettes    Last attempt to quit: 11/01/2013    Years since quitting: 3.8  . Smokeless tobacco: Never Used  Substance Use Topics  . Alcohol use: No    Alcohol/week: 0.0 oz  . Drug use: No     Allergies   Anesthetics, ester   Review of Systems Review of  Systems  Constitutional: Negative for chills and fever.  Musculoskeletal: Positive for arthralgias (L ankle) and joint swelling.  Skin: Negative for color change and wound.  Neurological: Negative for weakness and numbness.     Physical Exam Updated Vital Signs BP (!) 171/72 (BP Location: Left Arm)   Pulse 84   Temp 97.9 F (36.6 C) (Oral)   Resp 20   Ht _0  (1.575 m)   Wt 70.8 kg (156 lb)   SpO2 96%   BMI 28.53 kg/m   Physical Exam  Constitutional: She appears well-developed and well-nourished. No distress.  HENT:  Head: Normocephalic and atraumatic.  Eyes: Right eye exhibits no discharge. Left eye exhibits no discharge.  Pulmonary/Chest: Effort normal. No respiratory distress.  Musculoskeletal:  Swelling and tenderness to palpation over the left lateral malleolus, no pain over the medial malleolus, dorsi and plantar flexion limited by pain, 2+ DP and TP pulses, confirmed with Doppler, sensation intact throughout foot and ankle  Exam of left lower leg and knee is unremarkable  Neurological: She is alert. Coordination normal.  Skin: She is not diaphoretic.  Psychiatric: She has a normal mood and affect. Her behavior is normal.  Nursing note and vitals reviewed.    ED Treatments / Results  Labs (all labs ordered are listed, but only abnormal results are displayed) Labs Reviewed - No data to display  EKG  EKG Interpretation None       Radiology Dg Ankle Complete Left  Result Date: 09/10/2017 CLINICAL DATA:  Recent slip and fall with ankle pain, initial  encounter EXAM: LEFT ANKLE COMPLETE - 3+ VIEW COMPARISON:  None. FINDINGS: Oblique fracture is noted through the distal fibula with only mild displacement. No other fractures are seen. Generalized soft tissue swelling is noted laterally. IMPRESSION: Mildly displaced oblique fracture through the distal fibula with soft tissue swelling. Electronically Signed   By: Inez Catalina M.D.   On: 09/10/2017 13:05    Procedures Procedures (including critical care time)  Medications Ordered in ED Medications  oxyCODONE-acetaminophen (PERCOCET/ROXICET) 5-325 MG per tablet 1 tablet (1 tablet Oral Given 09/10/17 1359)     Initial Impression / Assessment and Plan / ED Course  I have reviewed the triage vital signs and the nursing notes.  Pertinent labs & imaging results that were available during my care of the patient were reviewed by me and considered in my medical decision making (see chart for details).  Patient presents to the ED for evaluation of left ankle pain after she slipped while walking downhill.  No other injury sustained.  Did not hit her head, no loss of consciousness.  Left ankle with pain and swelling over the lateral malleolus, range of motion limited by pain, left lower extremity is neurovascularly intact.  X-ray shows mildly displaced oblique fracture through the distal fibula with surrounding soft tissue swelling.  Percocet given for pain  Patient discussed with Dr. Mardelle Matte with orthopedics who recommends posterior splinting and crutches and he will see the patient in his office on Monday.  Discussed with Dr. Melina Copa concern regarding patient's age and her ability to get around on crutches and be completely nonweightbearing he recommends doing a Cam walker boot with crutches, and patient has a walker she will be able to use at home.  We feel this will be a safer option for the patient.  Pt ambulated safely here in the department with crutches and Cam walker boot.  Patient has been taking  several doses of Tylenol at home  without improvement in her pain, discussed risks and benefits of narcotic pain medication, will prescribe patient with a few to bridge her until she sees orthopedics on Monday.  Patient strict precautions regarding this medication, she is to take 1 tablet every 6 hours as needed, cautioned patient that this medication can make her drowsy, and in elderly patients this medication can last longer than typical.  Patient expresses understanding.  At this time patient is stable for discharge home, her sister will be staying with her this weekend to help her around the house and she is aware that she needs to call Dr. Luanna Cole office first thing Monday morning for follow-up appointment later that day.  Return precautions discussed.  Patient and family expressed understanding and agreement with plan.   Patient discussed with Dr. Melina Copa who saw and examined patient as well and is in agreement with plan  Final Clinical Impressions(s) / ED Diagnoses   Final diagnoses:  Closed fracture of distal end of left fibula, unspecified fracture morphology, initial encounter    ED Discharge Orders        Ordered    oxyCODONE-acetaminophen (PERCOCET) 5-325 MG tablet  Every 6 hours PRN     09/10/17 1543       Jacqlyn Larsen, PA-C 09/10/17 1719    Hayden Rasmussen, MD 09/11/17 1554

## 2017-09-10 NOTE — ED Notes (Addendum)
Patient demonstrated how to use crutches.Patient was a little whoozy with medication she was given. Patient was able to bear some weight on the bad leg with the cam walker. Patient and family member had no further questions. PA was aware of the use of crutches. Patent stated she is getting a walker from her brother when she gets home. PA aware .

## 2017-09-10 NOTE — ED Notes (Signed)
Pt and FM given d/c instructions as per chart. Verbalizes understanding. No questions.

## 2017-09-12 DIAGNOSIS — S82832A Other fracture of upper and lower end of left fibula, initial encounter for closed fracture: Secondary | ICD-10-CM | POA: Diagnosis not present

## 2017-09-15 ENCOUNTER — Other Ambulatory Visit: Payer: Self-pay

## 2017-09-15 ENCOUNTER — Encounter (HOSPITAL_COMMUNITY): Payer: Self-pay | Admitting: *Deleted

## 2017-09-15 NOTE — Progress Notes (Signed)
Spoke with pt for pre-op call. Pt denies cardiac history, chest pain or sob. Pt is a type 2 diabetic. Last A1c was 10.1 on 06/29/17. Pt states her fasting blood sugar is usually between 200-400. Instructed pt to take 1/2 of her regular dose of Lantus Insulin this evening, will take 30 units. Instructed her to check her blood sugar in the AM when she gets up and every 2 hours until she leaves for the hospital. If blood sugar is >220 take 1/2 of usual correction dose of Novolog insulin. If blood sugar is 70 or below, treat with 1/2 cup of clear juice (apple or cranberry) and recheck blood sugar 15 minutes after drinking juice. If blood sugar continues to be 70 or below, call the Short Stay department and ask to speak to a nurse. Also instructed pt not to take her [place] in the AM. Pt requested that I call her back and leave a message on her voicemail for which medicines to take in AM. I called and left that information on her voicemail.

## 2017-09-16 ENCOUNTER — Ambulatory Visit (HOSPITAL_COMMUNITY): Payer: Medicare Other | Admitting: Certified Registered"

## 2017-09-16 ENCOUNTER — Encounter (HOSPITAL_COMMUNITY)
Admission: RE | Disposition: A | Discharge status: Home / Self Care | Payer: Self-pay | Source: Ambulatory Visit | Attending: Orthopedic Surgery

## 2017-09-16 ENCOUNTER — Ambulatory Visit (HOSPITAL_COMMUNITY)
Admission: RE | Admit: 2017-09-16 | Discharge: 2017-09-16 | Disposition: A | Discharge status: Home / Self Care | Payer: Medicare Other | Source: Ambulatory Visit | Attending: Orthopedic Surgery | Admitting: Orthopedic Surgery

## 2017-09-16 ENCOUNTER — Encounter (HOSPITAL_COMMUNITY): Payer: Self-pay

## 2017-09-16 DIAGNOSIS — S82832A Other fracture of upper and lower end of left fibula, initial encounter for closed fracture: Secondary | ICD-10-CM | POA: Diagnosis present

## 2017-09-16 DIAGNOSIS — Z8673 Personal history of transient ischemic attack (TIA), and cerebral infarction without residual deficits: Secondary | ICD-10-CM | POA: Diagnosis not present

## 2017-09-16 DIAGNOSIS — Z79899 Other long term (current) drug therapy: Secondary | ICD-10-CM | POA: Insufficient documentation

## 2017-09-16 DIAGNOSIS — Z794 Long term (current) use of insulin: Secondary | ICD-10-CM | POA: Diagnosis not present

## 2017-09-16 DIAGNOSIS — F329 Major depressive disorder, single episode, unspecified: Secondary | ICD-10-CM | POA: Insufficient documentation

## 2017-09-16 DIAGNOSIS — E119 Type 2 diabetes mellitus without complications: Secondary | ICD-10-CM | POA: Insufficient documentation

## 2017-09-16 DIAGNOSIS — S8265XA Nondisplaced fracture of lateral malleolus of left fibula, initial encounter for closed fracture: Secondary | ICD-10-CM | POA: Insufficient documentation

## 2017-09-16 DIAGNOSIS — Z7982 Long term (current) use of aspirin: Secondary | ICD-10-CM | POA: Insufficient documentation

## 2017-09-16 DIAGNOSIS — Z79890 Hormone replacement therapy (postmenopausal): Secondary | ICD-10-CM | POA: Insufficient documentation

## 2017-09-16 DIAGNOSIS — F419 Anxiety disorder, unspecified: Secondary | ICD-10-CM | POA: Insufficient documentation

## 2017-09-16 DIAGNOSIS — I10 Essential (primary) hypertension: Secondary | ICD-10-CM | POA: Insufficient documentation

## 2017-09-16 DIAGNOSIS — X58XXXA Exposure to other specified factors, initial encounter: Secondary | ICD-10-CM | POA: Insufficient documentation

## 2017-09-16 DIAGNOSIS — K219 Gastro-esophageal reflux disease without esophagitis: Secondary | ICD-10-CM | POA: Diagnosis not present

## 2017-09-16 DIAGNOSIS — S82842A Displaced bimalleolar fracture of left lower leg, initial encounter for closed fracture: Secondary | ICD-10-CM | POA: Diagnosis not present

## 2017-09-16 DIAGNOSIS — Z87891 Personal history of nicotine dependence: Secondary | ICD-10-CM | POA: Diagnosis not present

## 2017-09-16 DIAGNOSIS — G8918 Other acute postprocedural pain: Secondary | ICD-10-CM | POA: Diagnosis not present

## 2017-09-16 HISTORY — PX: ORIF ANKLE FRACTURE: SHX5408

## 2017-09-16 HISTORY — DX: Other fracture of upper and lower end of left fibula, initial encounter for closed fracture: S82.832A

## 2017-09-16 HISTORY — DX: Pneumonia, unspecified organism: J18.9

## 2017-09-16 HISTORY — DX: Other fracture of unspecified lower leg, initial encounter for closed fracture: S82.899A

## 2017-09-16 LAB — GLUCOSE, CAPILLARY
Glucose-Capillary: 133 mg/dL — ABNORMAL HIGH (ref 65–99)
Glucose-Capillary: 97 mg/dL (ref 65–99)

## 2017-09-16 LAB — CBC
HCT: 39.7 % (ref 36.0–46.0)
Hemoglobin: 12.6 g/dL (ref 12.0–15.0)
MCH: 27.8 pg (ref 26.0–34.0)
MCHC: 31.7 g/dL (ref 30.0–36.0)
MCV: 87.4 fL (ref 78.0–100.0)
Platelets: 250 10*3/uL (ref 150–400)
RBC: 4.54 MIL/uL (ref 3.87–5.11)
RDW: 14.8 % (ref 11.5–15.5)
WBC: 9.2 10*3/uL (ref 4.0–10.5)

## 2017-09-16 LAB — BASIC METABOLIC PANEL
Anion gap: 14 (ref 5–15)
BUN: 11 mg/dL (ref 6–20)
CO2: 23 mmol/L (ref 22–32)
Calcium: 9.6 mg/dL (ref 8.9–10.3)
Chloride: 101 mmol/L (ref 101–111)
Creatinine, Ser: 0.89 mg/dL (ref 0.44–1.00)
GFR calc Af Amer: >60 mL/min (ref >60)
GFR calc non Af Amer: >60 mL/min (ref >60)
Glucose, Bld: 128 mg/dL — ABNORMAL HIGH (ref 65–99)
Potassium: 3.7 mmol/L (ref 3.5–5.1)
Sodium: 138 mmol/L (ref 135–145)

## 2017-09-16 LAB — HEMOGLOBIN A1C
Hgb A1c MFr Bld: 13 % — ABNORMAL HIGH (ref 4.8–5.6)
Mean Plasma Glucose: 326.4 mg/dL

## 2017-09-16 SURGERY — OPEN REDUCTION INTERNAL FIXATION (ORIF) ANKLE FRACTURE
Anesthesia: General | Site: Ankle | Laterality: Left

## 2017-09-16 MED ORDER — PHENYLEPHRINE 40 MCG/ML (10ML) SYRINGE FOR IV PUSH (FOR BLOOD PRESSURE SUPPORT)
PREFILLED_SYRINGE | INTRAVENOUS | Status: AC
  Filled 2017-09-16: qty 10

## 2017-09-16 MED ORDER — ONDANSETRON HCL 4 MG/2ML IJ SOLN
INTRAMUSCULAR | Status: DC | PRN
  Administered 2017-09-16: 4 mg via INTRAVENOUS

## 2017-09-16 MED ORDER — MIDAZOLAM HCL 5 MG/5ML IJ SOLN
INTRAMUSCULAR | Status: DC | PRN
  Administered 2017-09-16: 1 mg via INTRAVENOUS

## 2017-09-16 MED ORDER — DEXAMETHASONE SODIUM PHOSPHATE 10 MG/ML IJ SOLN
INTRAMUSCULAR | Status: DC | PRN
  Administered 2017-09-16: 5 mg via INTRAVENOUS

## 2017-09-16 MED ORDER — BUPIVACAINE-EPINEPHRINE (PF) 0.25% -1:200000 IJ SOLN
INTRAMUSCULAR | Status: DC | PRN
  Administered 2017-09-16: 10 mL via PERINEURAL

## 2017-09-16 MED ORDER — ONDANSETRON HCL 4 MG/2ML IJ SOLN
INTRAMUSCULAR | Status: AC
  Filled 2017-09-16: qty 2

## 2017-09-16 MED ORDER — BUPIVACAINE HCL (PF) 0.25 % IJ SOLN
INTRAMUSCULAR | Status: AC
  Filled 2017-09-16: qty 30

## 2017-09-16 MED ORDER — PROPOFOL 10 MG/ML IV BOLUS
INTRAVENOUS | Status: DC | PRN
  Administered 2017-09-16: 140 mg via INTRAVENOUS

## 2017-09-16 MED ORDER — MIDAZOLAM HCL 2 MG/2ML IJ SOLN
2.0000 mg | Freq: Once | INTRAMUSCULAR | Status: AC
  Administered 2017-09-16: 2 mg via INTRAVENOUS

## 2017-09-16 MED ORDER — CEFAZOLIN SODIUM-DEXTROSE 2-4 GM/100ML-% IV SOLN
2.0000 g | INTRAVENOUS | Status: AC
  Administered 2017-09-16: 2 g via INTRAVENOUS
  Filled 2017-09-16: qty 100

## 2017-09-16 MED ORDER — ROPIVACAINE HCL 7.5 MG/ML IJ SOLN
INTRAMUSCULAR | Status: DC | PRN
  Administered 2017-09-16: 20 mL via PERINEURAL

## 2017-09-16 MED ORDER — FENTANYL CITRATE (PF) 100 MCG/2ML IJ SOLN: 25.0000 ug | INTRAMUSCULAR | Status: DC | PRN

## 2017-09-16 MED ORDER — PROPOFOL 10 MG/ML IV BOLUS
INTRAVENOUS | Status: AC
  Filled 2017-09-16: qty 20

## 2017-09-16 MED ORDER — MIDAZOLAM HCL 2 MG/2ML IJ SOLN
INTRAMUSCULAR | Status: AC
  Filled 2017-09-16: qty 2

## 2017-09-16 MED ORDER — DEXAMETHASONE SODIUM PHOSPHATE 10 MG/ML IJ SOLN
INTRAMUSCULAR | Status: AC
  Filled 2017-09-16: qty 1

## 2017-09-16 MED ORDER — 0.9 % SODIUM CHLORIDE (POUR BTL) OPTIME
TOPICAL | Status: DC | PRN
  Administered 2017-09-16: 1000 mL

## 2017-09-16 MED ORDER — PHENYLEPHRINE HCL 10 MG/ML IJ SOLN
INTRAVENOUS | Status: DC | PRN
  Administered 2017-09-16: 20 ug/min via INTRAVENOUS

## 2017-09-16 MED ORDER — PHENYLEPHRINE 40 MCG/ML (10ML) SYRINGE FOR IV PUSH (FOR BLOOD PRESSURE SUPPORT)
PREFILLED_SYRINGE | INTRAVENOUS | Status: DC | PRN
  Administered 2017-09-16 (×2): 80 ug via INTRAVENOUS
  Administered 2017-09-16: 120 ug via INTRAVENOUS

## 2017-09-16 MED ORDER — FENTANYL CITRATE (PF) 100 MCG/2ML IJ SOLN
INTRAMUSCULAR | Status: AC
  Administered 2017-09-16: 50 ug via INTRAVENOUS
  Filled 2017-09-16: qty 2

## 2017-09-16 MED ORDER — LIDOCAINE 2% (20 MG/ML) 5 ML SYRINGE
INTRAMUSCULAR | Status: AC
  Filled 2017-09-16: qty 5

## 2017-09-16 MED ORDER — MIDAZOLAM HCL 2 MG/2ML IJ SOLN
INTRAMUSCULAR | Status: AC
  Administered 2017-09-16: 2 mg via INTRAVENOUS
  Filled 2017-09-16: qty 2

## 2017-09-16 MED ORDER — OXYCODONE HCL 5 MG PO TABS: 5.0000 mg | ORAL_TABLET | ORAL | 0 refills | Status: DC | PRN

## 2017-09-16 MED ORDER — LACTATED RINGERS IV SOLN
INTRAVENOUS | Status: DC
  Administered 2017-09-16 (×2): via INTRAVENOUS

## 2017-09-16 MED ORDER — LIDOCAINE 2% (20 MG/ML) 5 ML SYRINGE
INTRAMUSCULAR | Status: DC | PRN
  Administered 2017-09-16: 60 mg via INTRAVENOUS

## 2017-09-16 MED ORDER — FENTANYL CITRATE (PF) 250 MCG/5ML IJ SOLN
INTRAMUSCULAR | Status: AC
  Filled 2017-09-16: qty 5

## 2017-09-16 MED ORDER — FENTANYL CITRATE (PF) 100 MCG/2ML IJ SOLN
50.0000 ug | Freq: Once | INTRAMUSCULAR | Status: AC
  Administered 2017-09-16: 50 ug via INTRAVENOUS

## 2017-09-16 SURGICAL SUPPLY — 67 items
BANDAGE ACE 4X5 VEL STRL LF (GAUZE/BANDAGES/DRESSINGS) ×4 IMPLANT
BANDAGE ACE 6X5 VEL STRL LF (GAUZE/BANDAGES/DRESSINGS) ×2 IMPLANT
BANDAGE ESMARK 6X9 LF (GAUZE/BANDAGES/DRESSINGS) IMPLANT
BENZOIN TINCTURE PRP APPL 2/3 (GAUZE/BANDAGES/DRESSINGS) ×2 IMPLANT
BIT DRILL 110X2.5XQCK CNCT (BIT) ×1 IMPLANT
BIT DRILL 2.5 (BIT) ×1
BIT DRILL QC 110 3.5 (BIT) ×1
BIT DRILL QC 110 3.5MM (BIT) ×1 IMPLANT
BIT DRL 110X2.5XQCK CNCT (BIT) ×1
BNDG ESMARK 6X9 LF (GAUZE/BANDAGES/DRESSINGS)
BOOTCOVER CLEANROOM LRG (PROTECTIVE WEAR) ×4 IMPLANT
CANISTER SUCT 3000ML PPV (MISCELLANEOUS) ×2 IMPLANT
CLSR STERI-STRIP ANTIMIC 1/2X4 (GAUZE/BANDAGES/DRESSINGS) ×2 IMPLANT
COVER SURGICAL LIGHT HANDLE (MISCELLANEOUS) ×2 IMPLANT
CUFF TOURNIQUET SINGLE 34IN LL (TOURNIQUET CUFF) IMPLANT
CUFF TOURNIQUET SINGLE 44IN (TOURNIQUET CUFF) IMPLANT
DECANTER SPIKE VIAL GLASS SM (MISCELLANEOUS) IMPLANT
DRAPE C-ARM 42X72 X-RAY (DRAPES) ×2 IMPLANT
DRAPE C-ARMOR (DRAPES) IMPLANT
DRAPE INCISE IOBAN 66X45 STRL (DRAPES) IMPLANT
DRAPE OEC MINIVIEW 54X84 (DRAPES) ×2 IMPLANT
DRAPE U-SHAPE 47X51 STRL (DRAPES) IMPLANT
DRILL BIT QC 110 3.5MM (BIT) ×1
DRSG PAD ABDOMINAL 8X10 ST (GAUZE/BANDAGES/DRESSINGS) ×2 IMPLANT
DURAPREP 26ML APPLICATOR (WOUND CARE) ×2 IMPLANT
ELECT REM PT RETURN 9FT ADLT (ELECTROSURGICAL) ×2
ELECTRODE REM PT RTRN 9FT ADLT (ELECTROSURGICAL) ×1 IMPLANT
GAUZE SPONGE 4X4 12PLY STRL (GAUZE/BANDAGES/DRESSINGS) ×2 IMPLANT
GAUZE XEROFORM 1X8 LF (GAUZE/BANDAGES/DRESSINGS) ×4 IMPLANT
GLOVE BIOGEL PI ORTHO PRO SZ8 (GLOVE) ×2
GLOVE ORTHO TXT STRL SZ7.5 (GLOVE) ×2 IMPLANT
GLOVE PI ORTHO PRO STRL SZ8 (GLOVE) ×2 IMPLANT
GLOVE SURG ORTHO 8.0 STRL STRW (GLOVE) ×2 IMPLANT
GOWN STRL REUS W/ TWL LRG LVL3 (GOWN DISPOSABLE) ×1 IMPLANT
GOWN STRL REUS W/ TWL XL LVL3 (GOWN DISPOSABLE) ×2 IMPLANT
GOWN STRL REUS W/TWL LRG LVL3 (GOWN DISPOSABLE) ×1
GOWN STRL REUS W/TWL XL LVL3 (GOWN DISPOSABLE) ×2
KIT BASIN OR (CUSTOM PROCEDURE TRAY) ×2 IMPLANT
KIT ROOM TURNOVER OR (KITS) ×2 IMPLANT
MANIFOLD NEPTUNE II (INSTRUMENTS) ×2 IMPLANT
NS IRRIG 1000ML POUR BTL (IV SOLUTION) ×2 IMPLANT
PACK ORTHO EXTREMITY (CUSTOM PROCEDURE TRAY) ×2 IMPLANT
PAD ARMBOARD 7.5X6 YLW CONV (MISCELLANEOUS) ×4 IMPLANT
PAD CAST 4YDX4 CTTN HI CHSV (CAST SUPPLIES) ×2 IMPLANT
PADDING CAST COTTON 4X4 STRL (CAST SUPPLIES) ×2
PLATE 6HOLE 1/3 TUBULAR (Plate) ×2 IMPLANT
SCREW CANCELLOUS 4.0X18 (Screw) ×4 IMPLANT
SCREW CANCELLOUS FT 4.0X14 (Screw) ×4 IMPLANT
SCREW CORTICAL 3.5 16MM (Screw) ×2 IMPLANT
SCREW CORTICAL 3.5X10 (Screw) ×2 IMPLANT
SCREW CORTICAL 3.5X12 (Screw) ×4 IMPLANT
SPLINT PLASTER CAST XFAST 5X30 (CAST SUPPLIES) ×1 IMPLANT
SPLINT PLASTER XFAST SET 5X30 (CAST SUPPLIES) ×1
SPONGE LAP 4X18 X RAY DECT (DISPOSABLE) ×2 IMPLANT
SUCTION FRAZIER HANDLE 10FR (MISCELLANEOUS) ×1
SUCTION TUBE FRAZIER 10FR DISP (MISCELLANEOUS) ×1 IMPLANT
SUT MNCRL AB 3-0 PS2 18 (SUTURE) ×2 IMPLANT
SUT VIC AB 0 CT1 27 (SUTURE) ×1
SUT VIC AB 0 CT1 27XBRD ANBCTR (SUTURE) ×1 IMPLANT
SUT VIC AB 3-0 SH 8-18 (SUTURE) ×2 IMPLANT
SYR CONTROL 10ML LL (SYRINGE) IMPLANT
TOWEL OR 17X24 6PK STRL BLUE (TOWEL DISPOSABLE) ×2 IMPLANT
TOWEL OR 17X26 10 PK STRL BLUE (TOWEL DISPOSABLE) ×2 IMPLANT
TUBE CONNECTING 12X1/4 (SUCTIONS) ×2 IMPLANT
UNDERPAD 30X30 (UNDERPADS AND DIAPERS) ×2 IMPLANT
WATER STERILE IRR 1000ML POUR (IV SOLUTION) ×2 IMPLANT
YANKAUER SUCT BULB TIP NO VENT (SUCTIONS) IMPLANT

## 2017-09-16 NOTE — Op Note (Signed)
09/16/2017  PATIENT:  Sonia Skinner    PRE-OPERATIVE DIAGNOSIS:  left distal fibula fracture  POST-OPERATIVE DIAGNOSIS:  Same  PROCEDURE:  OPEN REDUCTION INTERNAL FIXATION (ORIF) LEFT ANKLE FRACTURE, DISTAL FIBULA 3 views left ankle 1 stress view left ankle  SURGEON:  Johnny Bridge, MD  PHYSICIAN ASSISTANT: Joya Gaskins, OPA-C, present and scrubbed throughout the case, critical for completion in a timely fashion, and for retraction, instrumentation, and closure.  ANESTHESIA:   General with regional block  ESTIMATED BLOOD LOSS: minimal  PREOPERATIVE INDICATIONS:  Sonia Skinner is a  67 y.o. female with a diagnosis of left ankle fracture who elected for surgical management to minimize the risk for malunion and nonunion and post-traumatic arthritis.    The risks benefits and alternatives were discussed with the patient preoperatively including but not limited to the risks of infection, bleeding, nerve injury, cardiopulmonary complications, the need for revision surgery, the need for hardware removal, among others, and the patient was willing to proceed.  OPERATIVE IMPLANTS: 1/3 tubular plate Zimmer 6 hole, with a single interfragmentary lag screw  OPERATIVE PROCEDURE: The patient was brought to the operating room and placed in the supine position. All bony prominences were padded. General anesthesia was administered. The lower extremity was prepped and draped in the usual sterile fashion. The leg was elevated and exsanguinated and the tourniquet was inflated. Time out was performed.   We also prescrubbed the ankle.  Incision was made over the distal fibula and the fracture was exposed and reduced anatomically with a clamp. A lag screw was placed. I then applied a 1/3 tubular locking plate and secured it proximally and distally with non-locking screws. Bone quality was reasonably good. I used c-arm to confirm satisfactory reduction and fixation.   The syndesmosis was stressed using live  fluoroscopy and found to be stable.   The wounds were irrigated, and closed with vicryl with routine closure for the skin. The wounds were injected with local anesthetic. Sterile gauze was applied followed by a posterior splint. She was awakened and returned to the PACU in stable and satisfactory condition. There were no complications.  3 views of the left ankle were taken that demonstrate anatomic alignment with ORIF.  1 Stress view of the left ankle was taken that demonstrates stable syndesmosis.

## 2017-09-16 NOTE — Anesthesia Preprocedure Evaluation (Signed)
Anesthesia Evaluation  Patient identified by MRN, date of birth, ID band Patient awake    Reviewed: Allergy & Precautions, H&P , Patient's Chart, lab work & pertinent test results  Airway Mallampati: II  TM Distance: >3 FB Neck ROM: full    Dental no notable dental hx.    Pulmonary former smoker,    Pulmonary exam normal breath sounds clear to auscultation       Cardiovascular Exercise Tolerance: Good hypertension,  Rhythm:regular Rate:Normal     Neuro/Psych CVA    GI/Hepatic   Endo/Other  diabetes, Type 2  Renal/GU      Musculoskeletal   Abdominal   Peds  Hematology   Anesthesia Other Findings   Reproductive/Obstetrics                             Anesthesia Physical Anesthesia Plan  ASA: III  Anesthesia Plan: General   Post-op Pain Management:  Regional for Post-op pain   Induction: Intravenous  PONV Risk Score and Plan: 2 and Treatment may vary due to age or medical condition  Airway Management Planned: LMA  Additional Equipment:   Intra-op Plan:   Post-operative Plan:   Informed Consent: I have reviewed the patients History and Physical, chart, labs and discussed the procedure including the risks, benefits and alternatives for the proposed anesthesia with the patient or authorized representative who has indicated his/her understanding and acceptance.     Plan Discussed with: CRNA and Surgeon  Anesthesia Plan Comments: ( )        Anesthesia Quick Evaluation

## 2017-09-16 NOTE — Discharge Instructions (Signed)
Diet: As you were doing prior to hospitalization   Shower:  May shower but keep the wounds dry, use an occlusive plastic wrap, NO SOAKING IN TUB.  If the bandage gets wet, change with a clean dry gauze.  If you have a splint on, leave the splint in place and keep the splint dry with a plastic bag.  Dressing:  You may change your dressing 3-5 days after surgery, unless you have a splint.  If you have a splint, then just leave the splint in place and we will change your bandages during your first follow-up appointment.    If you had hand or foot surgery, we will plan to remove your stitches in about 2 weeks in the office.  For all other surgeries, there are sticky tapes (steri-strips) on your wounds and all the stitches are absorbable.  Leave the steri-strips in place when changing your dressings, they will peel off with time, usually 2-3 weeks.  Activity:  Increase activity slowly as tolerated, but follow the weight bearing instructions below.  The rules on driving is that you can not be taking narcotics while you drive, and you must feel in control of the vehicle.    Weight Bearing:   No weight on left leg.    To prevent constipation: you may use a stool softener such as -  Colace (over the counter) 100 mg by mouth twice a day  Drink plenty of fluids (prune juice may be helpful) and high fiber foods Miralax (over the counter) for constipation as needed.    Itching:  If you experience itching with your medications, try taking only a single pain pill, or even half a pain pill at a time.  You may take up to 10 pain pills per day, and you can also use benadryl over the counter for itching or also to help with sleep.   Precautions:  If you experience chest pain or shortness of breath - call 911 immediately for transfer to the hospital emergency department!!  If you develop a fever greater that 101 F, purulent drainage from wound, increased redness or drainage from wound, or calf pain -- Call the  office at [phone]                                                Follow- Up Appointment:  Please call for an appointment to be seen in 2 weeks [place] - [phone]

## 2017-09-16 NOTE — Anesthesia Procedure Notes (Signed)
Procedure Name: LMA Insertion Date/Time: 09/16/2017 1:33 PM Performed by: Freddie Breech, CRNA Pre-anesthesia Checklist: Patient identified, Emergency Drugs available, Suction available and Patient being monitored Patient Re-evaluated:Patient Re-evaluated prior to induction Oxygen Delivery Method: Circle System Utilized Preoxygenation: Pre-oxygenation with 100% oxygen Induction Type: IV induction Ventilation: Mask ventilation without difficulty LMA: LMA inserted LMA Size: 4.0 Number of attempts: 1 Airway Equipment and Method: Bite block Placement Confirmation: positive ETCO2 Tube secured with: Tape Dental Injury: Teeth and Oropharynx as per pre-operative assessment

## 2017-09-16 NOTE — Anesthesia Postprocedure Evaluation (Signed)
Anesthesia Post Note  Patient: Sonia Skinner  Procedure(s) Performed: OPEN REDUCTION INTERNAL FIXATION (ORIF) LEFT ANKLE FRACTURE (Left Ankle)     Patient location during evaluation: PACU Anesthesia Type: General Level of consciousness: sedated Pain management: pain level controlled Vital Signs Assessment: post-procedure vital signs reviewed and stable Respiratory status: spontaneous breathing and respiratory function stable Cardiovascular status: stable Postop Assessment: no apparent nausea or vomiting Anesthetic complications: no    Last Vitals:  Vitals:   09/16/17 1446 09/16/17 1500  BP: (!) 148/54 (!) 124/53  Pulse: 67 66  Resp: (!) 24 12  Temp: 36.8 C   SpO2: 100% 100%    Last Pain:  Vitals:   09/16/17 1038  TempSrc:   PainSc: 0-No pain                 Clarisse Rodriges DANIEL

## 2017-09-16 NOTE — Anesthesia Procedure Notes (Signed)
Anesthesia Regional Block: Popliteal block   Pre-Anesthetic Checklist: ,, timeout performed, Correct Patient, Correct Site, Correct Laterality, Correct Procedure, Correct Position, site marked, Risks and benefits discussed, pre-op evaluation,  At surgeon's request and post-op pain management  Laterality: Left  Prep: chloraprep        Procedures:,,,, ultrasound used (permanent image in chart),,,,  Narrative:  Start time: 09/16/2017 11:50 AM End time: 09/16/2017 11:55 AM Injection made incrementally with aspirations every 5 mL. Anesthesiologist: Lyndle Herrlich, MD

## 2017-09-16 NOTE — H&P (Signed)
PREOPERATIVE H&P  Chief Complaint: left ankle fracture  HPI: Sonia Skinner is a 66 y.o. female who presents for preoperative history and physical with a diagnosis of left ankle fracture. Symptoms are rated as moderate to severe, and have been worsening.  This is significantly impairing activities of daily living.  She has elected for surgical management.   Injury occurred September 07, 2017.  Pain rated 8/10.  Referred from [facility].  She has had multiple complicating past medical comorbidities as listed below.  She also has relatively poorly controlled diabetes.    Past Medical History:  Diagnosis Date  . Ankle fracture    Left  . Anxiety   . Carotid artery occlusion   . Complication of anesthesia    ALLERY TO ESTER BASE  . Depression    early 30s  . Diabetes mellitus    INSULIN DEPENDENT  . GERD (gastroesophageal reflux disease)   . Headache    years ago  . Hypertension   . Pneumonia   . Stroke (HCC) March 2015    left MCA infarct, slight weakness on left side  . Thyroid disease    Past Surgical History:  Procedure Laterality Date  . ABDOMINAL HYSTERECTOMY    . ANTERIOR FIXATION AND POSTERIOR MICRODISCECTOMY CERVICAL SPINE  1999  . CAROTID ENDARTERECTOMY Left 11-04-13   cea  . ENDARTERECTOMY Left 11/04/2013   Social History   Socioeconomic History  . Marital status: Legally Separated    Spouse name: None  . Number of children: 0  . Years of education: College  . Highest education level: None  Social Needs  . Financial resource strain: None  . Food insecurity - worry: None  . Food insecurity - inability: None  . Transportation needs - medical: None  . Transportation needs - non-medical: None  Occupational History  . Occupation: [employer]    Employer: [employer]  Tobacco Use  . Smoking status: Former Smoker    Packs/day: 0.20    Years: 20.00    Pack years: 4.00    Types: Cigarettes    Last attempt to quit: 11/01/2013    Years since quitting: 3.8   . Smokeless tobacco: Never Used  Substance and Sexual Activity  . Alcohol use: No    Alcohol/week: 0.0 oz  . Drug use: No  . Sexual activity: Yes  Other Topics Concern  . None  Social History Narrative   Patient lives at home with sister.   Caffeine Use: 2 cups daily; sodas occasionally   Family History  Problem Relation Age of Onset  . Diabetes Mother   . Heart disease Mother        Before age 60  . Cancer Father        Lung  . Hypertension Sister   . Diabetes Sister   . Diabetes Sister   . Diabetes Sister    Allergies  Allergen Reactions  . Anesthetics, Ester Anaphylaxis   Prior to Admission medications   Medication Sig Start Date End Date Taking? Authorizing Provider  acetaminophen (TYLENOL) 500 MG tablet Take 2,000 mg by mouth every 6 (six) hours as needed for moderate pain or headache.   Yes [provider]  amitriptyline (ELAVIL) 100 MG tablet Take 1 tablet (100 mg total) by mouth at bedtime. 07/15/17  Yes Arfeen, Syed T, MD  amLODipine (NORVASC) 2.5 MG tablet Take 1 tablet (2.5 mg total) by mouth daily. 02/07/17  Yes Jones, Thomas L, MD  aspirin 325 MG tablet   Take 1 tablet (325 mg total) by mouth daily. 11/06/13  Yes Ellis, Allison L, NP  atorvastatin (LIPITOR) 40 MG tablet TAKE 1 TABLET BY MOUTH ONCE DAILY Patient taking differently: TAKE 40 MG BY MOUTH ONCE DAILY 04/20/17  Yes Ellison, Sean, MD  Cholecalciferol 2000 units TABS Take 1 tablet (2,000 Units total) by mouth daily. 11/25/16  Yes Jones, Thomas L, MD  Cyanocobalamin (VITAMIN B-12 CR PO) Take by mouth daily.   Yes [provider]  dexlansoprazole (DEXILANT) 60 MG capsule Take 1 capsule (60 mg total) daily by mouth. 06/29/17  Yes Jones, Thomas L, MD  Empagliflozin-Metformin HCl (SYNJARDY) 12-998 MG TABS Take 1 tablet by mouth 2 (two) times daily. 08/05/17  Yes Jones, Thomas L, MD  EQ ALLERGY RELIEF 10 MG tablet TAKE 1 TABLET BY MOUTH ONCE DAILY AS NEEDED FOR ALLERGIES Patient taking differently:  TAKE 10 MG BY MOUTH ONCE DAILY AS NEEDED FOR ALLERGIES 03/03/17  Yes Jones, Thomas L, MD  escitalopram (LEXAPRO) 20 MG tablet Take 1 tablet (20 mg total) by mouth daily. 07/15/17 07/15/18 Yes Arfeen, Syed T, MD  exenatide (BYETTA 10 MCG PEN) 10 MCG/0.04ML SOPN injection Inject 0.04 mLs (10 mcg total) into the skin 2 (two) times daily with a meal. Patient taking differently: Inject 5 mcg into the skin 2 (two) times daily with a meal.  01/31/17  Yes Jones, Thomas L, MD  gabapentin (NEURONTIN) 100 MG capsule Take 1 capsule (100 mg total) by mouth 2 (two) times daily. 08/05/17  Yes Jones, Thomas L, MD  insulin glargine (LANTUS) 100 UNIT/ML injection Inject 0.6 mLs (60 Units total) into the skin at bedtime. 01/31/17  Yes Jones, Thomas L, MD  insulin lispro (HUMALOG) 100 UNIT/ML KiwkPen Inject into skin subcutaneously 8 units at breakfast, 4 units at lunch and 30 untis at supper. Pen needles 4x daily. Patient taking differently: Inject 4-30 Units into the skin See admin instructions. Inject into skin subcutaneously 8 units at breakfast, 4 units at lunch and 20-30 units per sliding scale at supper. 04/26/17  Yes Ellison, Sean, MD  lamoTRIgine (LAMICTAL) 150 MG tablet Take 1 tablet (150 mg total) by mouth daily. 07/15/17  Yes Arfeen, Syed T, MD  levothyroxine (SYNTHROID) 150 MCG tablet Take 1 tablet (150 mcg total) daily before breakfast by mouth. 06/29/17  Yes Jones, Thomas L, MD  nebivolol (BYSTOLIC) 10 MG tablet Take 1 tablet (10 mg total) by mouth daily. 01/31/17  Yes Jones, Thomas L, MD  nicotine (NICODERM CQ - DOSED IN MG/24 HOURS) 14 mg/24hr patch Place 14 mg onto the skin daily.   Yes [provider]  OVER THE COUNTER MEDICATION 1 tablet daily. Pravagen (for Memory)   Yes [provider]  OVER THE COUNTER MEDICATION 2 tablets daily. Focus Factor (memory)   Yes [provider]  oxyCODONE-acetaminophen (PERCOCET) 5-325 MG tablet Take 1 tablet by mouth every 6 (six) hours as  needed. Patient taking differently: Take 1 tablet by mouth every 6 (six) hours as needed for moderate pain.  09/10/17  Yes Ford, Kelsey N, PA-C  Blood Glucose Monitoring Suppl (ACCU-CHEK GUIDE) w/Device KIT 1 Act by Does not apply route 4 (four) times daily - after meals and at bedtime. Patient not taking: Reported on 09/12/2017 12/20/16   Jones, Thomas L, MD  glucose blood test strip USE TO CHECK BLOOD SUGARS TWICE DAILY Patient not taking: Reported on 09/12/2017 08/26/17   Jones, Thomas L, MD  INSULIN SYRINGE .5CC/28G 28G X 1/2" 0.5 ML MISC Give   insulin once per night Patient not taking: Reported on 09/12/2017 08/14/15   Lance Bosch, NP  Lancets Misc. (ACCU-CHEK FASTCLIX LANCET) KIT 1 Act by Does not apply route 4 (four) times daily. Patient not taking: Reported on 09/12/2017 01/15/17   [name], MD  Lancets Thin MISC Use to check blood sugar 4 times per day. Patient not taking: Reported on 09/12/2017 01/19/17   [name], MD  telmisartan (MICARDIS) 40 MG tablet Take 1 tablet (40 mg total) daily by mouth. Patient not taking: Reported on 09/12/2017 06/30/17   [name], MD     Positive ROS: All other systems have been reviewed and were otherwise negative with the exception of those mentioned in the HPI and as above.  Physical Exam: General: Alert, no acute distress Cardiovascular: No pedal edema Respiratory: No cyanosis, no use of accessory musculature GI: No organomegaly, abdomen is soft and non-tender Skin: No lesions in the area of chief complaint Neurologic: Sensation intact distally Psychiatric: Patient is competent for consent with normal mood and affect Lymphatic: No axillary or cervical lymphadenopathy  MUSCULOSKELETAL: Left ankle fracture has widening of the medial clear space on x-ray, positive pain to palpation laterally as well as medially.  Assessment: left ankle fracture, distal fibula   Plan: Plan for Procedure(s): OPEN REDUCTION INTERNAL FIXATION (ORIF)  LEFT ANKLE FRACTURE  The risks benefits and alternatives were discussed with the patient including but not limited to the risks of nonoperative treatment, versus surgical intervention including infection, bleeding, nerve injury, malunion, nonunion, the need for revision surgery, hardware prominence, hardware failure, the need for hardware removal, blood clots, cardiopulmonary complications, morbidity, mortality, among others, and they were willing to proceed.     Johnny Bridge, MD Cell [phone]   09/16/2017 12:55 PM

## 2017-09-16 NOTE — Transfer of Care (Signed)
Immediate Anesthesia Transfer of Care Note  Patient: Sonia Skinner  Procedure(s) Performed: OPEN REDUCTION INTERNAL FIXATION (ORIF) LEFT ANKLE FRACTURE (Left Ankle)  Patient Location: PACU  Anesthesia Type:GA combined with regional for post-op pain  Level of Consciousness: drowsy and patient cooperative  Airway & Oxygen Therapy: Patient Spontanous Breathing and Patient connected to face mask oxygen  Post-op Assessment: Report given to RN and Post -op Vital signs reviewed and stable  Post vital signs: Reviewed and stable  Last Vitals:  Vitals:   09/16/17 1220 09/16/17 1446  BP: (!) 139/53 (!) 148/54  Pulse: 73 67  Resp: (!) 9 (!) 24  Temp:  36.8 C  SpO2: 100% 100%    Last Pain:  Vitals:   09/16/17 1038  TempSrc:   PainSc: 0-No pain      Patients Stated Pain Goal: 3 (59/10/28 9022)  Complications: No apparent anesthesia complications

## 2017-09-19 ENCOUNTER — Encounter (HOSPITAL_COMMUNITY): Payer: Self-pay | Admitting: Orthopedic Surgery

## 2017-09-26 DIAGNOSIS — S82842D Displaced bimalleolar fracture of left lower leg, subsequent encounter for closed fracture with routine healing: Secondary | ICD-10-CM | POA: Diagnosis not present

## 2017-10-03 ENCOUNTER — Telehealth: Payer: Self-pay | Admitting: Internal Medicine

## 2017-10-03 ENCOUNTER — Other Ambulatory Visit: Payer: Self-pay | Admitting: Internal Medicine

## 2017-10-03 DIAGNOSIS — E038 Other specified hypothyroidism: Secondary | ICD-10-CM

## 2017-10-03 NOTE — Telephone Encounter (Signed)
Patient called and left VM to return call with what type of Accu-check machine she has so the strips can be ordered. They are not listed on her medication profile.

## 2017-10-03 NOTE — Telephone Encounter (Signed)
Patient called and left VM to return call with the type of machine she uses for glucose testing.

## 2017-10-03 NOTE — Telephone Encounter (Signed)
Copied from [place]. Topic: Quick Communication - Rx Refill/Question >> Oct 03, 2017 12:35 PM Ahmed Prima L wrote: Medication: Accu-Check Guide Test Strips (90 days)   Has the patient contacted their pharmacy? Tried to on her mychart & will not work. Tried to contact pharmacy also   (Agent: If no, request that the patient contact the pharmacy for the refill.)   Preferred Pharmacy (with phone number or street name): [facility], [place]   Agent: Please be advised that RX refills may take up to 3 business days. We ask that you follow-up with your pharmacy.

## 2017-10-03 NOTE — Telephone Encounter (Signed)
Pt called back Testing 4-5/day Pt is out of strips Pt has AccuChek Guide meter  [facility], [place] - [place]

## 2017-10-04 MED ORDER — GLUCOSE BLOOD VI STRP: ORAL_STRIP | 3 refills | Status: DC

## 2017-10-04 NOTE — Telephone Encounter (Signed)
Strips for AccuCheck Guide Meter refill Last OV:06/29/17 Last Refill:Unknown-not listed on medication profile Pharmacy:[facility] on [address]-[place]

## 2017-10-04 NOTE — Addendum Note (Signed)
Addended by: Earnstine Regal on: 10/04/2017 09:56 AM   Modules accepted: Orders

## 2017-10-04 NOTE — Telephone Encounter (Signed)
Reviewed chart pt is up-to-date sent refills to [facility].../lmb  

## 2017-10-11 ENCOUNTER — Ambulatory Visit (INDEPENDENT_AMBULATORY_CARE_PROVIDER_SITE_OTHER): Payer: Medicare Other | Admitting: Psychiatry

## 2017-10-11 ENCOUNTER — Encounter (HOSPITAL_COMMUNITY): Payer: Self-pay | Admitting: Psychiatry

## 2017-10-11 DIAGNOSIS — Z87891 Personal history of nicotine dependence: Secondary | ICD-10-CM | POA: Diagnosis not present

## 2017-10-11 DIAGNOSIS — G8929 Other chronic pain: Secondary | ICD-10-CM

## 2017-10-11 DIAGNOSIS — I69311 Memory deficit following cerebral infarction: Secondary | ICD-10-CM

## 2017-10-11 DIAGNOSIS — F419 Anxiety disorder, unspecified: Secondary | ICD-10-CM

## 2017-10-11 DIAGNOSIS — F331 Major depressive disorder, recurrent, moderate: Secondary | ICD-10-CM

## 2017-10-11 DIAGNOSIS — Z79899 Other long term (current) drug therapy: Secondary | ICD-10-CM

## 2017-10-11 MED ORDER — ESCITALOPRAM OXALATE 20 MG PO TABS: 20.0000 mg | ORAL_TABLET | Freq: Every day | ORAL | 0 refills | Status: DC

## 2017-10-11 MED ORDER — LAMOTRIGINE 150 MG PO TABS
150.0000 mg | ORAL_TABLET | Freq: Every day | ORAL | 0 refills | Status: DC
Start: 2017-10-11 — End: 2018-01-31

## 2017-10-11 MED ORDER — AMITRIPTYLINE HCL 100 MG PO TABS: 100.0000 mg | ORAL_TABLET | Freq: Every day | ORAL | 0 refills | Status: DC

## 2017-10-11 NOTE — Progress Notes (Signed)
BH MD/PA/NP OP Progress Note  10/11/2017 3:54 PM Sonia Skinner  MRN:  [id number]  Chief Complaint: I fell few weeks ago and broke my ankle.  I have surgery.  HPI: Sonia Skinner came for her follow-up appointment.  She is frustrated because she fell a few weeks ago and broke her left ankle and she had a surgery.  She is recovering from the surgery.  She is taking pain medication but also noticed that her mood is not as bad since taking the Lamictal Lexapro and amitriptyline.  She is sleeping good.  She has chronic issues related to her memory but she did not see the neurologist.  She admitted that her crying spells and feeling of hopelessness and worthlessness are less intense since taking the Lamictal on time.  She also pleased that her blood pressure and blood sugar is much under control since compliant with medication.  Patient has chronic pain, tingling and memory impairment after the stroke.  She feels proud that she resume her work after 2 weeks of surgery.  She is working as a Oceanographer.  Patient denies drinking alcohol or using any illegal substances.  She lives with her sister and she is pleased that sister is not moving any time soon.  Patient denies any agitation, anger, mania or any psychosis.  Visit Diagnosis:    ICD-10-CM   1. Moderate episode of recurrent major depressive disorder (HCC) F33.1 lamoTRIgine (LAMICTAL) 150 MG tablet    escitalopram (LEXAPRO) 20 MG tablet    amitriptyline (ELAVIL) 100 MG tablet    Past Psychiatric History: Viewed. Patient reported taking antidepressants the past 20 years. She has history of taking overdose and admission at [facility]. She was seeing a physician at [facility] and given the Strattera, Adderall, Zoloft, Celexa. Patient denies any history of psychosis, mania or any hallucination. She was never tested for ADD. In 2001 she moved to [facility] and she saw psychiatrist there. She was given Wellbutrin to stop smoking.  Patient denies any history of sexual, verbal and physical and emotional abuse.  Past Medical History:  Past Medical History:  Diagnosis Date  . Ankle fracture    Left  . Anxiety   . Carotid artery occlusion   . Closed fracture of left distal fibula 09/16/2017  . Complication of anesthesia    ALLERY TO ESTER BASE  . Depression    early 54s  . Diabetes mellitus    INSULIN DEPENDENT  . GERD (gastroesophageal reflux disease)   . Headache    years ago  . Hypertension   . Pneumonia   . Stroke [facility]) March 2015    left MCA infarct, slight weakness on left side  . Thyroid disease     Past Surgical History:  Procedure Laterality Date  . ABDOMINAL HYSTERECTOMY    . ANTERIOR FIXATION AND POSTERIOR MICRODISCECTOMY CERVICAL SPINE  1999  . CAROTID ENDARTERECTOMY Left 11-04-13   cea  . ENDARTERECTOMY Left 11/04/2013  . ORIF ANKLE FRACTURE Left 09/16/2017   Procedure: OPEN REDUCTION INTERNAL FIXATION (ORIF) LEFT ANKLE FRACTURE;  Surgeon: Marchia Bond, MD;  Location: [facility];  Service: Orthopedics;  Laterality: Left;    Family Psychiatric History: Viewed.  Family History:  Family History  Problem Relation Age of Onset  . Diabetes Mother   . Heart disease Mother        Before age 72  . Cancer Father        Lung  . Hypertension Sister   . Diabetes  Sister   . Diabetes Sister   . Diabetes Sister     Social History:  Social History   Socioeconomic History  . Marital status: Legally Separated    Spouse name: Not on file  . Number of children: 0  . Years of education: College  . Highest education level: Not on file  Social Needs  . Financial resource strain: Not on file  . Food insecurity - worry: Not on file  . Food insecurity - inability: Not on file  . Transportation needs - medical: Not on file  . Transportation needs - non-medical: Not on file  Occupational History  . Occupation: [employer]    Employer: [employer]  Tobacco Use  . Smoking status: Former Smoker     Packs/day: 0.20    Years: 20.00    Pack years: 4.00    Types: Cigarettes    Last attempt to quit: 11/01/2013    Years since quitting: 3.9  . Smokeless tobacco: Never Used  Substance and Sexual Activity  . Alcohol use: No    Alcohol/week: 0.0 oz  . Drug use: No  . Sexual activity: Yes  Other Topics Concern  . Not on file  Social History Narrative   Patient lives at home with sister.   Caffeine Use: 2 cups daily; sodas occasionally    Allergies:  Allergies  Allergen Reactions  . Anesthetics, Ester Anaphylaxis    Metabolic Disorder Labs: Recent Results (from the past 2160 hour(s))  Hemoglobin A1c     Status: Abnormal   Collection Time: 09/16/17 10:15 AM  Result Value Ref Range   Hgb A1c MFr Bld 13.0 (H) 4.8 - 5.6 %    Comment: (NOTE) Pre diabetes:          5.7%-6.4% Diabetes:              >6.4% Glycemic control for   <7.0% adults with diabetes    Mean Plasma Glucose 326.4 mg/dL    Comment: Performed at [facility] [address], [place], [place] [address]  Basic metabolic panel     Status: Abnormal   Collection Time: 09/16/17 10:15 AM  Result Value Ref Range   Sodium 138 135 - 145 mmol/L   Potassium 3.7 3.5 - 5.1 mmol/L   Chloride 101 101 - 111 mmol/L   CO2 23 22 - 32 mmol/L   Glucose, Bld 128 (H) 65 - 99 mg/dL   BUN 11 6 - 20 mg/dL   Creatinine, Ser 0.89 0.44 - 1.00 mg/dL   Calcium 9.6 8.9 - 10.3 mg/dL   GFR calc non Af Amer >60 >60 mL/min   GFR calc Af Amer >60 >60 mL/min    Comment: (NOTE) The eGFR has been calculated using the CKD EPI equation. This calculation has not been validated in all clinical situations. eGFR's persistently <60 mL/min signify possible Chronic Kidney Disease.    Anion gap 14 5 - 15    Comment: Performed at [facility] [address] [place] [address]  CBC     Status: None   Collection Time: 09/16/17 10:15 AM  Result Value Ref Range   WBC 9.2 4.0 - 10.5 K/uL   RBC 4.54 3.87 - 5.11 MIL/uL   Hemoglobin  12.6 12.0 - 15.0 g/dL   HCT 39.7 36.0 - 46.0 %   MCV 87.4 78.0 - 100.0 fL   MCH 27.8 26.0 - 34.0 pg   MCHC 31.7 30.0 - 36.0 g/dL   RDW  14.8 11.5 - 15.5 %   Platelets 250 150 - 400 K/uL    Comment: Performed at [facility], [place] [address], [place], [place] [address]  Glucose, capillary     Status: Abnormal   Collection Time: 09/16/17 10:30 AM  Result Value Ref Range   Glucose-Capillary 133 (H) 65 - 99 mg/dL  Glucose, capillary     Status: None   Collection Time: 09/16/17  2:50 PM  Result Value Ref Range   Glucose-Capillary 97 65 - 99 mg/dL   Comment 1 Notify RN    Lab Results  Component Value Date   HGBA1C 13.0 (H) 09/16/2017   MPG 326.4 09/16/2017   MPG 255 (H) 11/02/2013   No results found for: PROLACTIN Lab Results  Component Value Date   CHOL 173 11/24/2016   TRIG 192.0 (H) 11/24/2016   HDL 51.60 11/24/2016   CHOLHDL 3 11/24/2016   VLDL 38.4 11/24/2016   LDLCALC 83 11/24/2016   LDLCALC 105 (H) 09/18/2014   Lab Results  Component Value Date   TSH 17.87 (H) 06/29/2017   TSH 2.09 12/20/2016    Therapeutic Level Labs: No results found for: LITHIUM No results found for: VALPROATE No components found for:  CBMZ  Current Medications: Current Outpatient Medications  Medication Sig Dispense Refill  . acetaminophen (TYLENOL) 500 MG tablet Take 2,000 mg by mouth every 6 (six) hours as needed for moderate pain or headache.    Marland Kitchen amitriptyline (ELAVIL) 100 MG tablet Take 1 tablet (100 mg total) by mouth at bedtime. 90 tablet 0  . amLODipine (NORVASC) 2.5 MG tablet Take 1 tablet (2.5 mg total) by mouth daily. 90 tablet 1  . aspirin 325 MG tablet Take 1 tablet (325 mg total) by mouth daily.    Marland Kitchen atorvastatin (LIPITOR) 40 MG tablet TAKE 1 TABLET BY MOUTH ONCE DAILY (Patient taking differently: TAKE 40 MG BY MOUTH ONCE DAILY) 90 tablet 1  . Cholecalciferol 2000 units TABS Take 1 tablet (2,000 Units total) by mouth daily. 90 tablet 3  . Cyanocobalamin (VITAMIN B-12 CR  PO) Take by mouth daily.    Marland Kitchen dexlansoprazole (DEXILANT) 60 MG capsule Take 1 capsule (60 mg total) daily by mouth. 90 capsule 1  . Empagliflozin-Metformin HCl (SYNJARDY) 12-998 MG TABS Take 1 tablet by mouth 2 (two) times daily. 180 tablet 1  . EQ ALLERGY RELIEF 10 MG tablet TAKE 1 TABLET BY MOUTH ONCE DAILY AS NEEDED FOR ALLERGIES (Patient taking differently: TAKE 10 MG BY MOUTH ONCE DAILY AS NEEDED FOR ALLERGIES) 90 tablet 3  . escitalopram (LEXAPRO) 20 MG tablet Take 1 tablet (20 mg total) by mouth daily. 90 tablet 0  . exenatide (BYETTA 10 MCG PEN) 10 MCG/0.04ML SOPN injection Inject 0.04 mLs (10 mcg total) into the skin 2 (two) times daily with a meal. (Patient taking differently: Inject 5 mcg into the skin 2 (two) times daily with a meal. ) 7.2 mL 1  . gabapentin (NEURONTIN) 100 MG capsule Take 1 capsule (100 mg total) by mouth 2 (two) times daily. 180 capsule 1  . glucose blood (ACCU-CHEK GUIDE) test strip Use to check blood sugars twice a day 100 each 3  . insulin glargine (LANTUS) 100 UNIT/ML injection Inject 0.6 mLs (60 Units total) into the skin at bedtime. 10 mL 11  . insulin lispro (HUMALOG) 100 UNIT/ML KiwkPen Inject into skin subcutaneously 8 units at breakfast, 4 units at lunch and 30 untis at supper. Pen needles 4x daily. (Patient taking differently:  Inject 4-30 Units into the skin See admin instructions. Inject into skin subcutaneously 8 units at breakfast, 4 units at lunch and 20-30 units per sliding scale at supper.) 15 pen 11  . lamoTRIgine (LAMICTAL) 150 MG tablet Take 1 tablet (150 mg total) by mouth daily. 90 tablet 0  . levothyroxine (SYNTHROID, LEVOTHROID) 150 MCG tablet TAKE 1 TABLET BY MOUTH ONCE DAILY BEFORE BREAKFAST 90 tablet 0  . nebivolol (BYSTOLIC) 10 MG tablet Take 1 tablet (10 mg total) by mouth daily. 90 tablet 1  . nicotine (NICODERM CQ - DOSED IN MG/24 HOURS) 14 mg/24hr patch Place 14 mg onto the skin daily.    Marland Kitchen OVER THE COUNTER MEDICATION 1 tablet daily.  Pravagen (for Memory)    . OVER THE COUNTER MEDICATION 2 tablets daily. Focus Factor (memory)    . oxyCODONE (ROXICODONE) 5 MG immediate release tablet Take 1 tablet (5 mg total) by mouth every 4 (four) hours as needed for severe pain. 30 tablet 0   No current facility-administered medications for this visit.      Musculoskeletal: Strength & Muscle Tone: within normal limits Gait & Station: unsteady, difficulty while walking Patient leans: Left  Psychiatric Specialty Exam: Review of Systems  Musculoskeletal: Positive for joint pain.  Neurological: Positive for tingling.    There were no vitals taken for this visit.There is no height or weight on file to calculate BMI.  General Appearance: Casual  Eye Contact:  Good  Speech:  Clear and Coherent  Volume:  Normal  Mood:  Anxious  Affect:  Congruent  Thought Process:  Goal Directed  Orientation:  Full (Time, Place, and Person)  Thought Content: Rumination   Suicidal Thoughts:  No  Homicidal Thoughts:  No  Memory:  Immediate;   Fair Recent;   Fair Remote;   Fair  Judgement:  Good  Insight:  Good  Psychomotor Activity:  Normal  Concentration:  Concentration: Fair and Attention Span: Fair  Recall:  [place] of Knowledge: Good  Language: Good  Akathisia:  No  Handed:  Right  AIMS (if indicated): not done  Assets:  Communication Skills Desire for Improvement Housing Social Support  ADL's:  Intact  Cognition: WNL  Sleep:  Fair   Screenings: GAD-7     Office Visit from 08/14/2015 in [facility]  Total GAD-7 Score  7    PHQ2-9     Office Visit from 06/29/2017 in [facility] Office Visit from 11/20/2015 in [facility] Office Visit from 08/14/2015 in [facility] Office Visit from 12/26/2014 in [facility] Office Visit from 05/08/2014 in [facility]   PHQ-2 Total Score  0  0  2  0  0  PHQ-9 Total Score  No data  No data  6  No data  No data       Assessment and Plan: Major depressive disorder, recurrent.  Anxiety disorder NOS.  Reassurance given.  Patient is doing better despite increased stress in recent weeks.  She like to continue Lamictal 150 mg daily, Lexapro 20 mg daily and amitriptyline 100 mg at bedtime.  Reinforced to see neurology for chronic memory issues.  Patient is not interested in counseling.  Recommended to call us back if she has any question or any concern.  Follow-up in 3 months.   Kathlee Nations, MD 10/11/2017, 3:54 PM

## 2017-10-13 ENCOUNTER — Encounter: Payer: Self-pay | Admitting: Internal Medicine

## 2017-10-14 DIAGNOSIS — S82842D Displaced bimalleolar fracture of left lower leg, subsequent encounter for closed fracture with routine healing: Secondary | ICD-10-CM | POA: Diagnosis not present

## 2017-10-26 DIAGNOSIS — S82842D Displaced bimalleolar fracture of left lower leg, subsequent encounter for closed fracture with routine healing: Secondary | ICD-10-CM | POA: Diagnosis not present

## 2017-11-02 ENCOUNTER — Other Ambulatory Visit: Payer: Self-pay | Admitting: Internal Medicine

## 2017-11-02 ENCOUNTER — Other Ambulatory Visit: Payer: Self-pay | Admitting: Endocrinology

## 2017-11-02 DIAGNOSIS — K2100 Gastro-esophageal reflux disease with esophagitis, without bleeding: Secondary | ICD-10-CM

## 2017-11-02 DIAGNOSIS — E785 Hyperlipidemia, unspecified: Secondary | ICD-10-CM

## 2017-11-02 DIAGNOSIS — S82842D Displaced bimalleolar fracture of left lower leg, subsequent encounter for closed fracture with routine healing: Secondary | ICD-10-CM | POA: Diagnosis not present

## 2017-11-03 ENCOUNTER — Encounter: Payer: Self-pay | Admitting: Internal Medicine

## 2017-11-03 ENCOUNTER — Telehealth: Payer: Self-pay | Admitting: Internal Medicine

## 2017-11-03 DIAGNOSIS — E113519 Type 2 diabetes mellitus with proliferative diabetic retinopathy with macular edema, unspecified eye: Secondary | ICD-10-CM

## 2017-11-03 DIAGNOSIS — Z794 Long term (current) use of insulin: Principal | ICD-10-CM

## 2017-11-03 MED ORDER — EXENATIDE 10 MCG/0.04ML ~~LOC~~ SOPN: 10.0000 ug | PEN_INJECTOR | Freq: Two times a day (BID) | SUBCUTANEOUS | 1 refills | Status: DC

## 2017-11-03 NOTE — Telephone Encounter (Signed)
Please refer refill request to PCP

## 2017-11-03 NOTE — Telephone Encounter (Signed)
Okay to refill the [place]. Please advise.

## 2017-11-03 NOTE — Telephone Encounter (Signed)
Copied from [place] [id number]. Topic: Quick Communication - Rx Refill/Question >> Nov 03, 2017  9:28 AM Aurelio Brash B wrote: Pt states she is out of the medication  Medication: exenatide (BYETTA 10 MCG PEN) 10 MCG/0.04ML SOPN injection   Has the patient contacted their pharmacy? Yes  (Agent: If no, request that the patient contact the pharmacy for the refill.)  Preferred Pharmacy (with phone number or street name):[facility], [place] [phone] (Phone) [phone] (Fax)        Agent: Please be advised that RX refills may take up to 3 business days. We ask that you follow-up with your pharmacy.

## 2017-11-03 NOTE — Telephone Encounter (Signed)
yes

## 2017-11-03 NOTE — Telephone Encounter (Signed)
Last ov 09/07/16 1 no-show and no future scheduled- refill or refuse please advise

## 2017-11-03 NOTE — Telephone Encounter (Signed)
erx sent in to [facility].

## 2017-11-04 ENCOUNTER — Other Ambulatory Visit: Payer: Self-pay | Admitting: Endocrinology

## 2017-11-05 NOTE — Telephone Encounter (Signed)
Please refill x 1 Ov is due  

## 2017-11-07 ENCOUNTER — Telehealth: Payer: Self-pay | Admitting: Internal Medicine

## 2017-11-07 ENCOUNTER — Encounter: Payer: Self-pay | Admitting: Internal Medicine

## 2017-11-07 ENCOUNTER — Other Ambulatory Visit: Payer: Self-pay | Admitting: Internal Medicine

## 2017-11-07 NOTE — Telephone Encounter (Unsigned)
Copied from [place] [id number]. Topic: Quick Communication - See Telephone Encounter >> Nov 07, 2017  2:42 PM Hewitt Shorts wrote: CRM for notification. See Telephone encounter for: 11/07/17.pt is needing to know if she needs to schedule a lab app or a appt to have her thyroid level and b12 level evaluated  Best number [phone]

## 2017-11-07 NOTE — Telephone Encounter (Signed)
LVM for patient to make an appointment 

## 2017-11-11 ENCOUNTER — Other Ambulatory Visit: Payer: Self-pay

## 2017-11-11 MED ORDER — INSULIN LISPRO 100 UNIT/ML (KWIKPEN): PEN_INJECTOR | SUBCUTANEOUS | 11 refills | Status: DC

## 2017-11-12 ENCOUNTER — Other Ambulatory Visit: Payer: Self-pay | Admitting: Internal Medicine

## 2017-11-12 DIAGNOSIS — I10 Essential (primary) hypertension: Secondary | ICD-10-CM

## 2017-11-18 ENCOUNTER — Encounter: Payer: Self-pay | Admitting: Internal Medicine

## 2017-11-18 MED ORDER — LORATADINE 10 MG PO TABS: ORAL_TABLET | ORAL | 3 refills | Status: DC

## 2017-11-30 DIAGNOSIS — S82842D Displaced bimalleolar fracture of left lower leg, subsequent encounter for closed fracture with routine healing: Secondary | ICD-10-CM | POA: Diagnosis not present

## 2017-12-06 ENCOUNTER — Telehealth: Payer: Self-pay | Admitting: Internal Medicine

## 2017-12-06 NOTE — Telephone Encounter (Signed)
Rec'd from Raliegh Ip forwarded 2 pages to Dr. Scarlette Calico

## 2018-01-10 ENCOUNTER — Ambulatory Visit (HOSPITAL_COMMUNITY): Payer: Self-pay | Admitting: Psychiatry

## 2018-01-18 DIAGNOSIS — S82842D Displaced bimalleolar fracture of left lower leg, subsequent encounter for closed fracture with routine healing: Secondary | ICD-10-CM | POA: Diagnosis not present

## 2018-01-20 ENCOUNTER — Other Ambulatory Visit: Payer: Self-pay | Admitting: Internal Medicine

## 2018-01-22 ENCOUNTER — Other Ambulatory Visit: Payer: Self-pay | Admitting: Internal Medicine

## 2018-01-26 ENCOUNTER — Encounter: Payer: Self-pay | Admitting: Internal Medicine

## 2018-01-31 ENCOUNTER — Ambulatory Visit (INDEPENDENT_AMBULATORY_CARE_PROVIDER_SITE_OTHER): Payer: Medicare Other | Admitting: Psychiatry

## 2018-01-31 ENCOUNTER — Encounter (HOSPITAL_COMMUNITY): Payer: Self-pay | Admitting: Psychiatry

## 2018-01-31 DIAGNOSIS — F331 Major depressive disorder, recurrent, moderate: Secondary | ICD-10-CM

## 2018-01-31 MED ORDER — AMITRIPTYLINE HCL 50 MG PO TABS: 50.0000 mg | ORAL_TABLET | Freq: Every day | ORAL | 0 refills | Status: DC

## 2018-01-31 MED ORDER — LAMOTRIGINE 25 MG PO TABS: ORAL_TABLET | ORAL | 0 refills | Status: DC

## 2018-01-31 NOTE — Progress Notes (Signed)
[place] MD/PA/NP OP Progress Note  01/31/2018 4:20 PM JOEI FRANGOS  MRN:  [id number]  Chief Complaint: I stopped taking medication but realized I am going into severe depression.  I  need to go back on my medication.  HPI: Emmogene came for her follow-up appointment.  She admitted that she stopped taking her medication 3 months ago because she was frustrated but realized it was her fault because now she is feeling more depressed and irritable.  She is not sleeping.  She is getting easily irritable, agitated, angry and having crying spells.  She had decided to go back on her medication.  She realized that medicine was helping her when she was taking it.  She feels sometimes hopeless and helpless but denies any suicidal thoughts or homicidal thought.  She continues to have chronic pain, tingling, and sometimes memory impairment after the stroke.  She had left ankle surgery which is not healed yet.  She is using boots.  She lives with her sister.  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  She like to see her primary care physician to resume her blood pressure medication.  Patient denies drinking alcohol or using any illegal substances.  Visit Diagnosis:    ICD-10-CM   1. Moderate episode of recurrent major depressive disorder (HCC) F33.1 lamoTRIgine (LAMICTAL) 25 MG tablet    amitriptyline (ELAVIL) 50 MG tablet    Past Psychiatric History: Reviewed. Patient reported taking antidepressants the past 20 years. She has history of taking overdose and admission at [facility]. She was seeing a physician at [facility] and given the Strattera, Adderall, Zoloft, Celexa. Patient denies any history of psychosis, mania or any hallucination. She was never tested for ADD. In 2001 she moved to [facility] and she saw psychiatrist there. She was given Wellbutrin to stop smoking. Patient denies any history of sexual, verbal and  physical and emotional abuse.  Past Medical History:  Past Medical History:  Diagnosis Date  . Ankle fracture    Left  . Anxiety   . Carotid artery occlusion   . Closed fracture of left distal fibula 09/16/2017  . Complication of anesthesia    ALLERY TO ESTER BASE  . Depression    early 30s  . Diabetes mellitus    INSULIN DEPENDENT  . GERD (gastroesophageal reflux disease)   . Headache    years ago  . Hypertension   . Pneumonia   . Stroke [facility]) March 2015    left MCA infarct, slight weakness on left side  . Thyroid disease     Past Surgical History:  Procedure Laterality Date  . ABDOMINAL HYSTERECTOMY    . ANTERIOR FIXATION AND POSTERIOR MICRODISCECTOMY CERVICAL SPINE  1999  . CAROTID ENDARTERECTOMY Left 11-04-13   cea  . ENDARTERECTOMY Left 11/04/2013  . ORIF ANKLE FRACTURE Left 09/16/2017   Procedure: OPEN REDUCTION INTERNAL FIXATION (ORIF) LEFT ANKLE FRACTURE;  Surgeon: Marchia Bond, MD;  Location: [facility];  Service: Orthopedics;  Laterality: Left;    Family Psychiatric History: Reviewed.  Family History:  Family History  Problem Relation Age of Onset  . Diabetes Mother   . Heart disease Mother        Before age 97  . Cancer Father        Lung  . Hypertension Sister   . Diabetes Sister   . Diabetes Sister   . Diabetes Sister  Social History:  Social History   Socioeconomic History  . Marital status: Legally Separated    Spouse name: Not on file  . Number of children: 0  . Years of education: College  . Highest education level: Not on file  Occupational History  . Occupation: [employer]    Employer: [employer]  Social Needs  . Financial resource strain: Not on file  . Food insecurity:    Worry: Not on file    Inability: Not on file  . Transportation needs:    Medical: Not on file    Non-medical: Not on file  Tobacco Use  . Smoking status: Former Smoker    Packs/day: 0.20    Years: 20.00    Pack years: 4.00    Types: Cigarettes    Last attempt  to quit: 11/01/2013    Years since quitting: 4.2  . Smokeless tobacco: Never Used  . Tobacco comment: Reports done to 1-2 a day.   Substance and Sexual Activity  . Alcohol use: Yes    Alcohol/week: 0.6 - 1.2 oz    Types: 1 - 2 Standard drinks or equivalent per week  . Drug use: No  . Sexual activity: Not Currently  Lifestyle  . Physical activity:    Days per week: Not on file    Minutes per session: Not on file  . Stress: Not on file  Relationships  . Social connections:    Talks on phone: Not on file    Gets together: Not on file    Attends religious service: Not on file    Active member of club or organization: Not on file    Attends meetings of clubs or organizations: Not on file    Relationship status: Not on file  Other Topics Concern  . Not on file  Social History Narrative   Patient lives at home with sister.   Caffeine Use: 2 cups daily; sodas occasionally    Allergies:  Allergies  Allergen Reactions  . Anesthetics, Ester Anaphylaxis    Metabolic Disorder Labs: Lab Results  Component Value Date   HGBA1C 13.0 (H) 09/16/2017   MPG 326.4 09/16/2017   MPG 255 (H) 11/02/2013   No results found for: PROLACTIN Lab Results  Component Value Date   CHOL 173 11/24/2016   TRIG 192.0 (H) 11/24/2016   HDL 51.60 11/24/2016   CHOLHDL 3 11/24/2016   VLDL 38.4 11/24/2016   LDLCALC 83 11/24/2016   LDLCALC 105 (H) 09/18/2014   Lab Results  Component Value Date   TSH 17.87 (H) 06/29/2017   TSH 2.09 12/20/2016    Therapeutic Level Labs: No results found for: LITHIUM No results found for: VALPROATE No components found for:  CBMZ  Current Medications: Current Outpatient Medications  Medication Sig Dispense Refill  . amitriptyline (ELAVIL) 100 MG tablet Take 1 tablet (100 mg total) by mouth at bedtime. (Patient not taking: Reported on 01/31/2018) 90 tablet 0  . amLODipine (NORVASC) 2.5 MG tablet Take 1 tablet (2.5 mg total) by mouth daily. (Patient not taking:  Reported on 01/31/2018) 90 tablet 1  . aspirin 325 MG tablet Take 1 tablet (325 mg total) by mouth daily. (Patient not taking: Reported on 01/31/2018)    . atorvastatin (LIPITOR) 40 MG tablet TAKE 1 TABLET BY MOUTH ONCE DAILY (Patient not taking: Reported on 01/31/2018) 90 tablet 1  . BYSTOLIC 10 MG tablet TAKE 1 TABLET BY MOUTH ONCE DAILY (Patient not taking: Reported on 01/31/2018) 90 tablet 1  . Cholecalciferol  2000 units TABS Take 1 tablet (2,000 Units total) by mouth daily. (Patient not taking: Reported on 01/31/2018) 90 tablet 3  . Cyanocobalamin (VITAMIN B-12 CR PO) Take by mouth daily.    Marland Kitchen DEXILANT 60 MG capsule TAKE 1 CAPSULE BY MOUTH ONCE DAILY (Patient not taking: Reported on 01/31/2018) 90 capsule 1  . Empagliflozin-Metformin HCl (SYNJARDY) 12-998 MG TABS Take 1 tablet by mouth 2 (two) times daily. (Patient not taking: Reported on 01/31/2018) 180 tablet 1  . escitalopram (LEXAPRO) 20 MG tablet Take 1 tablet (20 mg total) by mouth daily. (Patient not taking: Reported on 01/31/2018) 90 tablet 0  . exenatide (BYETTA 10 MCG PEN) 10 MCG/0.04ML SOPN injection Inject 0.04 mLs (10 mcg total) into the skin 2 (two) times daily with a meal. (Patient not taking: Reported on 01/31/2018) 7.2 mL 1  . fluconazole (DIFLUCAN) 150 MG tablet TAKE 1 TABLET BY MOUTH ONCE (Patient not taking: Reported on 01/31/2018) 1 tablet 3  . gabapentin (NEURONTIN) 100 MG capsule Take 1 capsule (100 mg total) by mouth 2 (two) times daily. (Patient not taking: Reported on 01/31/2018) 180 capsule 1  . glucose blood (ACCU-CHEK GUIDE) test strip Use to check blood sugars twice a day (Patient not taking: Reported on 01/31/2018) 100 each 3  . HYDROcodone-acetaminophen (NORCO) 10-325 MG tablet     . insulin glargine (LANTUS) 100 UNIT/ML injection Inject 0.6 mLs (60 Units total) into the skin at bedtime. (Patient not taking: Reported on 01/31/2018) 10 mL 11  . insulin lispro (HUMALOG) 100 UNIT/ML KiwkPen Inject into skin subcutaneously 8 units  at breakfast, 4 units at lunch and 30 untis at supper. Pen needles 4x daily. (Patient not taking: Reported on 01/31/2018) 15 pen 11  . lamoTRIgine (LAMICTAL) 150 MG tablet Take 1 tablet (150 mg total) by mouth daily. (Patient not taking: Reported on 01/31/2018) 90 tablet 0  . levothyroxine (SYNTHROID, LEVOTHROID) 150 MCG tablet TAKE 1 TABLET BY MOUTH ONCE DAILY BEFORE BREAKFAST (Patient not taking: Reported on 01/31/2018) 90 tablet 0  . loratadine (EQ ALLERGY RELIEF) 10 MG tablet TAKE 1 TABLET BY MOUTH ONCE DAILY AS NEEDED FOR ALLERGIES (Patient not taking: Reported on 01/31/2018) 90 tablet 3  . nebivolol (BYSTOLIC) 10 MG tablet Take 1 tablet (10 mg total) by mouth daily. (Patient not taking: Reported on 01/31/2018) 90 tablet 1  . nicotine (NICODERM CQ - DOSED IN MG/24 HOURS) 14 mg/24hr patch Place 14 mg onto the skin daily.    Marland Kitchen NOVOLOG FLEXPEN 100 UNIT/ML FlexPen INJECT 10 UNITS INTO THE SKIN WITH BREAKFAST, NONE WITH LUNCH, AND 25 UNITS WITH SUPPER (Patient not taking: Reported on 01/31/2018) 15 pen 1   No current facility-administered medications for this visit.      Musculoskeletal: Strength & Muscle Tone: within normal limits Gait & Station: difficulty walking because of pain Patient leans: Left  Psychiatric Specialty Exam: Review of Systems  Constitutional: Negative.   HENT: Negative.   Musculoskeletal: Positive for back pain and joint pain.  Neurological: Positive for tingling.  Psychiatric/Behavioral: Positive for depression. The patient is nervous/anxious and has insomnia.     Blood pressure (!) 144/76, pulse (!) 108, height 5' 0.75" (1.543 m), weight 146 lb (66.2 kg), SpO2 95 %.Body mass index is 27.81 kg/m.  General Appearance: Casual  Eye Contact:  Fair  Speech:  Clear and Coherent  Volume:  Normal  Mood:  Depressed, Dysphoric and Irritable  Affect:  Constricted and Depressed  Thought Process:  Goal Directed  Orientation:  Full (Time,  Place, and Person)  Thought Content:  Rumination   Suicidal Thoughts:  No  Homicidal Thoughts:  No  Memory:  Immediate;   Good Recent;   Good Remote;   Good  Judgement:  Fair  Insight:  Fair  Psychomotor Activity:  Normal  Concentration:  Concentration: Fair and Attention Span: Fair  Recall:  [place] of Knowledge: Good  Language: Good  Akathisia:  No  Handed:  Right  AIMS (if indicated): not done  Assets:  Communication Skills Housing  ADL's:  Intact  Cognition: WNL  Sleep:  Poor   Screenings: GAD-7     Office Visit from 08/14/2015 in [facility]  Total GAD-7 Score  7    PHQ2-9     Office Visit from 06/29/2017 in [facility] Visit from 11/20/2015 in [facility] Visit from 08/14/2015 in [facility] Office Visit from 12/26/2014 in [facility] [facility] Office Visit from 05/08/2014 in [facility]  PHQ-2 Total Score  0  0  2  0  0  PHQ-9 Total Score  -  -  6  -  -       Assessment and Plan: Major depressive disorder, recurrent.  Generalized anxiety disorder.  Discussed risk of noncompliance with medication.  Patient agreed that she need to go back on her medication.  We will resume slowly her Lamictal and amitriptyline.  We will start Lamictal 25 mg daily for 1 week and then 50 mg daily.  We will start amitriptyline 50 mg at bedtime to help her sleep.  We will consider increasing the dose on her next appointment.  We will also consider adding Lexapro which she used to take in the past.  I encouraged to see her neurology and primary care physician for medical needs.  Patient is not interested in counseling.  Recommended to call us back if she has any question or any concern.  Follow-up in 4 weeks. Time spent 25 minutes.  More than 50% of the time spent in psychoeducation, counseling and coordination of care.  Discuss safety plan that anytime  having active suicidal thoughts or homicidal thoughts then patient need to call 911 or go to the local emergency room.   Kathlee Nations, MD 01/31/2018, 4:20 PM

## 2018-02-02 ENCOUNTER — Other Ambulatory Visit: Payer: Self-pay | Admitting: Internal Medicine

## 2018-02-02 ENCOUNTER — Encounter: Payer: Self-pay | Admitting: Internal Medicine

## 2018-02-02 DIAGNOSIS — R413 Other amnesia: Secondary | ICD-10-CM | POA: Insufficient documentation

## 2018-02-08 ENCOUNTER — Ambulatory Visit: Payer: Medicare Other | Admitting: Internal Medicine

## 2018-02-08 DIAGNOSIS — Z0289 Encounter for other administrative examinations: Secondary | ICD-10-CM

## 2018-02-20 ENCOUNTER — Ambulatory Visit (INDEPENDENT_AMBULATORY_CARE_PROVIDER_SITE_OTHER): Payer: Medicare Other | Admitting: Internal Medicine

## 2018-02-20 ENCOUNTER — Encounter: Payer: Self-pay | Admitting: Internal Medicine

## 2018-02-20 ENCOUNTER — Other Ambulatory Visit: Payer: Self-pay

## 2018-02-20 ENCOUNTER — Other Ambulatory Visit (INDEPENDENT_AMBULATORY_CARE_PROVIDER_SITE_OTHER): Payer: Medicare Other

## 2018-02-20 VITALS — BP 174/64 | HR 95 | Temp 99.1°F | Resp 16 | Ht 60.75 in | Wt 153.0 lb

## 2018-02-20 DIAGNOSIS — E113519 Type 2 diabetes mellitus with proliferative diabetic retinopathy with macular edema, unspecified eye: Secondary | ICD-10-CM

## 2018-02-20 DIAGNOSIS — E118 Type 2 diabetes mellitus with unspecified complications: Secondary | ICD-10-CM

## 2018-02-20 DIAGNOSIS — E785 Hyperlipidemia, unspecified: Secondary | ICD-10-CM | POA: Diagnosis not present

## 2018-02-20 DIAGNOSIS — E039 Hypothyroidism, unspecified: Secondary | ICD-10-CM | POA: Diagnosis not present

## 2018-02-20 DIAGNOSIS — E876 Hypokalemia: Secondary | ICD-10-CM | POA: Diagnosis not present

## 2018-02-20 DIAGNOSIS — Z794 Long term (current) use of insulin: Secondary | ICD-10-CM

## 2018-02-20 DIAGNOSIS — I10 Essential (primary) hypertension: Secondary | ICD-10-CM

## 2018-02-20 DIAGNOSIS — E559 Vitamin D deficiency, unspecified: Secondary | ICD-10-CM

## 2018-02-20 DIAGNOSIS — Z8673 Personal history of transient ischemic attack (TIA), and cerebral infarction without residual deficits: Secondary | ICD-10-CM

## 2018-02-20 DIAGNOSIS — Z0000 Encounter for general adult medical examination without abnormal findings: Secondary | ICD-10-CM | POA: Insufficient documentation

## 2018-02-20 DIAGNOSIS — Z0001 Encounter for general adult medical examination with abnormal findings: Secondary | ICD-10-CM | POA: Diagnosis not present

## 2018-02-20 DIAGNOSIS — M8000XA Age-related osteoporosis with current pathological fracture, unspecified site, initial encounter for fracture: Secondary | ICD-10-CM | POA: Diagnosis not present

## 2018-02-20 DIAGNOSIS — F332 Major depressive disorder, recurrent severe without psychotic features: Secondary | ICD-10-CM | POA: Diagnosis not present

## 2018-02-20 DIAGNOSIS — Z1231 Encounter for screening mammogram for malignant neoplasm of breast: Secondary | ICD-10-CM

## 2018-02-20 DIAGNOSIS — I63512 Cerebral infarction due to unspecified occlusion or stenosis of left middle cerebral artery: Secondary | ICD-10-CM

## 2018-02-20 DIAGNOSIS — I6522 Occlusion and stenosis of left carotid artery: Secondary | ICD-10-CM

## 2018-02-20 LAB — URINALYSIS, ROUTINE W REFLEX MICROSCOPIC
Bilirubin Urine: NEGATIVE
Hgb urine dipstick: NEGATIVE
Ketones, ur: NEGATIVE
Leukocytes, UA: NEGATIVE
Nitrite: NEGATIVE
RBC / HPF: NONE SEEN (ref 0–?)
Specific Gravity, Urine: 1.01 (ref 1.000–1.030)
Total Protein, Urine: NEGATIVE
Urine Glucose: >=1000 — AB
Urobilinogen, UA: 0.2 (ref 0.0–1.0)
WBC, UA: NONE SEEN (ref 0–?)
pH: 6 (ref 5.0–8.0)

## 2018-02-20 LAB — LIPID PANEL
Cholesterol: 198 mg/dL (ref 0–200)
HDL: 60.1 mg/dL (ref >39.00)
LDL Cholesterol: 104 mg/dL — ABNORMAL HIGH (ref 0–99)
NonHDL: 138.32
Total CHOL/HDL Ratio: 3
Triglycerides: 170 mg/dL — ABNORMAL HIGH (ref 0.0–149.0)
VLDL: 34 mg/dL (ref 0.0–40.0)

## 2018-02-20 LAB — CBC WITH DIFFERENTIAL/PLATELET
Basophils Absolute: 0.1 10*3/uL (ref 0.0–0.1)
Basophils Relative: 1.3 % (ref 0.0–3.0)
Eosinophils Absolute: 0.3 10*3/uL (ref 0.0–0.7)
Eosinophils Relative: 3.1 % (ref 0.0–5.0)
HCT: 37.9 % (ref 36.0–46.0)
Hemoglobin: 12.2 g/dL (ref 12.0–15.0)
Lymphocytes Relative: 31.8 % (ref 12.0–46.0)
Lymphs Abs: 3 10*3/uL (ref 0.7–4.0)
MCHC: 32.3 g/dL (ref 30.0–36.0)
MCV: 87.6 fl (ref 78.0–100.0)
Monocytes Absolute: 0.5 10*3/uL (ref 0.1–1.0)
Monocytes Relative: 5.1 % (ref 3.0–12.0)
Neutro Abs: 5.5 10*3/uL (ref 1.4–7.7)
Neutrophils Relative %: 58.7 % (ref 43.0–77.0)
Platelets: 231 10*3/uL (ref 150.0–400.0)
RBC: 4.32 Mil/uL (ref 3.87–5.11)
RDW: 14.7 % (ref 11.5–15.5)
WBC: 9.4 10*3/uL (ref 4.0–10.5)

## 2018-02-20 LAB — COMPREHENSIVE METABOLIC PANEL
ALT: 12 U/L (ref 0–35)
AST: 15 U/L (ref 0–37)
Albumin: 4 g/dL (ref 3.5–5.2)
Alkaline Phosphatase: 128 U/L — ABNORMAL HIGH (ref 39–117)
BUN: 10 mg/dL (ref 6–23)
CO2: 26 mEq/L (ref 19–32)
Calcium: 9.5 mg/dL (ref 8.4–10.5)
Chloride: 107 mEq/L (ref 96–112)
Creatinine, Ser: 0.95 mg/dL (ref 0.40–1.20)
GFR: 75.1 mL/min (ref >60.00)
Glucose, Bld: 65 mg/dL — ABNORMAL LOW (ref 70–99)
Potassium: 3.4 mEq/L — ABNORMAL LOW (ref 3.5–5.1)
Sodium: 142 mEq/L (ref 135–145)
Total Bilirubin: 0.3 mg/dL (ref 0.2–1.2)
Total Protein: 6.9 g/dL (ref 6.0–8.3)

## 2018-02-20 LAB — HM DIABETES FOOT EXAM

## 2018-02-20 LAB — HEMOGLOBIN A1C: Hgb A1c MFr Bld: 15.6 % — ABNORMAL HIGH (ref 4.6–6.5)

## 2018-02-20 LAB — VITAMIN D 25 HYDROXY (VIT D DEFICIENCY, FRACTURES): VITD: 41.63 ng/mL (ref 30.00–100.00)

## 2018-02-20 LAB — MICROALBUMIN / CREATININE URINE RATIO
Creatinine,U: 53 mg/dL
Microalb Creat Ratio: 1.3 mg/g (ref 0.0–30.0)
Microalb, Ur: 0.7 mg/dL (ref 0.0–1.9)

## 2018-02-20 LAB — TSH: TSH: 10.44 u[IU]/mL — ABNORMAL HIGH (ref 0.35–4.50)

## 2018-02-20 MED ORDER — POTASSIUM CHLORIDE CRYS ER 20 MEQ PO TBCR: 20.0000 meq | EXTENDED_RELEASE_TABLET | Freq: Two times a day (BID) | ORAL | 1 refills | Status: DC

## 2018-02-20 MED ORDER — SEMAGLUTIDE (1 MG/DOSE) 2 MG/1.5ML ~~LOC~~ SOPN: 1.0000 | PEN_INJECTOR | SUBCUTANEOUS | 0 refills | Status: DC

## 2018-02-20 MED ORDER — ATORVASTATIN CALCIUM 40 MG PO TABS: 40.0000 mg | ORAL_TABLET | Freq: Every day | ORAL | 1 refills | Status: DC

## 2018-02-20 MED ORDER — INSULIN GLARGINE 100 UNIT/ML ~~LOC~~ SOLN: 60.0000 [IU] | Freq: Every day | SUBCUTANEOUS | 11 refills | Status: DC

## 2018-02-20 MED ORDER — EMPAGLIFLOZIN-METFORMIN HCL 5-1000 MG PO TABS: 1.0000 | ORAL_TABLET | Freq: Two times a day (BID) | ORAL | 1 refills | Status: DC

## 2018-02-20 MED ORDER — LEVOTHYROXINE SODIUM 175 MCG PO TABS: 175.0000 ug | ORAL_TABLET | Freq: Every day | ORAL | 0 refills | Status: DC

## 2018-02-20 MED ORDER — AZILSARTAN-CHLORTHALIDONE 40-12.5 MG PO TABS: 1.0000 | ORAL_TABLET | Freq: Every day | ORAL | 0 refills | Status: DC

## 2018-02-20 MED ORDER — NEBIVOLOL HCL 5 MG PO TABS: 5.0000 mg | ORAL_TABLET | Freq: Every day | ORAL | 0 refills | Status: DC

## 2018-02-20 NOTE — Patient Instructions (Signed)

## 2018-02-20 NOTE — Assessment & Plan Note (Signed)

## 2018-02-20 NOTE — Progress Notes (Signed)
Subjective:  Patient ID: Sonia Skinner, female    DOB: Jun 04, 1951  Age: 67 y.o. MRN: [id number]  CC: Hypertension; Hyperlipidemia; Diabetes; and Annual Exam   HPI Sonia Skinner presents for a CPX.  She is not compliant with nearly all of her meds.  She knows her blood sugar and blood pressure have not been well controlled.  She has had a few recent episodes of headache and blurred vision.  She denies CP, DOE, palpitations, or edema.  She does complain of fatigue and polys.  She has struggled recently with depression and feeling worthless, helpless and hopeless.  She is compliant with her current antidepressants but she is going to see her psychiatrist soon to consider a change in medications.  She denies SI or HI.  Past Medical History:  Diagnosis Date  . Ankle fracture    Left  . Anxiety   . Carotid artery occlusion   . Closed fracture of left distal fibula 09/16/2017  . Complication of anesthesia    ALLERY TO ESTER BASE  . Depression    early 68s  . Diabetes mellitus    INSULIN DEPENDENT  . GERD (gastroesophageal reflux disease)   . Headache    years ago  . Hypertension   . Pneumonia   . Stroke [facility]) March 2015    left MCA infarct, slight weakness on left side  . Thyroid disease    Past Surgical History:  Procedure Laterality Date  . ABDOMINAL HYSTERECTOMY    . ANTERIOR FIXATION AND POSTERIOR MICRODISCECTOMY CERVICAL SPINE  1999  . CAROTID ENDARTERECTOMY Left 11-04-13   cea  . ENDARTERECTOMY Left 11/04/2013  . ORIF ANKLE FRACTURE Left 09/16/2017   Procedure: OPEN REDUCTION INTERNAL FIXATION (ORIF) LEFT ANKLE FRACTURE;  Surgeon: Marchia Bond, MD;  Location: [facility];  Service: Orthopedics;  Laterality: Left;    reports that she quit smoking about 4 years ago. Her smoking use included cigarettes. She has a 4.00 pack-year smoking history. She has never used smokeless tobacco. She reports that she drinks about 0.6 - 1.2 oz of alcohol per week. She reports that she does not use  drugs. family history includes Cancer in her father; Diabetes in her mother, sister, sister, and sister; Heart disease in her mother; Hypertension in her sister. Allergies  Allergen Reactions  . Anesthetics, Ester Anaphylaxis    Outpatient Medications Prior to Visit  Medication Sig Dispense Refill  . amitriptyline (ELAVIL) 50 MG tablet Take 1 tablet (50 mg total) by mouth at bedtime. 30 tablet 0  . aspirin 325 MG tablet Take 1 tablet (325 mg total) by mouth daily.    . Cholecalciferol 2000 units TABS Take 1 tablet (2,000 Units total) by mouth daily. 90 tablet 3  . Cyanocobalamin (VITAMIN B-12 CR PO) Take by mouth daily.    Marland Kitchen DEXILANT 60 MG capsule TAKE 1 CAPSULE BY MOUTH ONCE DAILY 90 capsule 1  . escitalopram (LEXAPRO) 20 MG tablet Take 1 tablet (20 mg total) by mouth daily. 90 tablet 0  . gabapentin (NEURONTIN) 100 MG capsule Take 1 capsule (100 mg total) by mouth 2 (two) times daily. 180 capsule 1  . glucose blood (ACCU-CHEK GUIDE) test strip Use to check blood sugars twice a day 100 each 3  . insulin lispro (HUMALOG) 100 UNIT/ML KiwkPen Inject into skin subcutaneously 8 units at breakfast, 4 units at lunch and 30 untis at supper. Pen needles 4x daily. 15 pen 11  . lamoTRIgine (LAMICTAL) 25 MG tablet Take one tab  daily for 1 week and than 2 tab daily 60 tablet 0  . amLODipine (NORVASC) 2.5 MG tablet Take 1 tablet (2.5 mg total) by mouth daily. 90 tablet 1  . atorvastatin (LIPITOR) 40 MG tablet TAKE 1 TABLET BY MOUTH ONCE DAILY 90 tablet 1  . Empagliflozin-Metformin HCl (SYNJARDY) 12-998 MG TABS Take 1 tablet by mouth 2 (two) times daily. 180 tablet 1  . exenatide (BYETTA 10 MCG PEN) 10 MCG/0.04ML SOPN injection Inject 0.04 mLs (10 mcg total) into the skin 2 (two) times daily with a meal. 7.2 mL 1  . fluconazole (DIFLUCAN) 150 MG tablet TAKE 1 TABLET BY MOUTH ONCE 1 tablet 3  . HYDROcodone-acetaminophen (NORCO) 10-325 MG tablet     . insulin glargine (LANTUS) 100 UNIT/ML injection Inject  0.6 mLs (60 Units total) into the skin at bedtime. 10 mL 11  . levothyroxine (SYNTHROID, LEVOTHROID) 150 MCG tablet TAKE 1 TABLET BY MOUTH ONCE DAILY BEFORE BREAKFAST 90 tablet 0  . loratadine (EQ ALLERGY RELIEF) 10 MG tablet TAKE 1 TABLET BY MOUTH ONCE DAILY AS NEEDED FOR ALLERGIES 90 tablet 3  . nebivolol (BYSTOLIC) 10 MG tablet Take 1 tablet (10 mg total) by mouth daily. 90 tablet 1  . nicotine (NICODERM CQ - DOSED IN MG/24 HOURS) 14 mg/24hr patch Place 14 mg onto the skin daily.    Marland Kitchen NOVOLOG FLEXPEN 100 UNIT/ML FlexPen INJECT 10 UNITS INTO THE SKIN WITH BREAKFAST, NONE WITH LUNCH, AND 25 UNITS WITH SUPPER 15 pen 1  . BYSTOLIC 10 MG tablet TAKE 1 TABLET BY MOUTH ONCE DAILY (Patient not taking: Reported on 01/31/2018) 90 tablet 1   No facility-administered medications prior to visit.     ROS Review of Systems  Constitutional: Positive for fatigue. Negative for appetite change, diaphoresis and unexpected weight change.  HENT: Negative.   Eyes: Positive for visual disturbance. Negative for photophobia.  Respiratory: Negative for apnea, cough, chest tightness, shortness of breath and wheezing.   Cardiovascular: Negative for chest pain, palpitations and leg swelling.  Gastrointestinal: Negative for abdominal pain, constipation, diarrhea, nausea and vomiting.  Endocrine: Positive for polydipsia, polyphagia and polyuria. Negative for cold intolerance and heat intolerance.  Genitourinary: Negative.  Negative for decreased urine volume, difficulty urinating, dysuria, frequency, hematuria and urgency.  Musculoskeletal: Negative.  Negative for arthralgias, back pain, myalgias and neck pain.  Skin: Negative for color change, pallor, rash and wound.  Neurological: Positive for headaches. Negative for dizziness, tremors, syncope, speech difficulty and light-headedness.  Hematological: Negative for adenopathy. Does not bruise/bleed easily.  Psychiatric/Behavioral: Positive for decreased concentration and  dysphoric mood. Negative for agitation, behavioral problems, confusion, self-injury, sleep disturbance and suicidal ideas. The patient is not nervous/anxious.     Objective:  BP (!) 174/64 (BP Location: Left Arm, Patient Position: Sitting, Cuff Size: Normal)   Pulse 95   Temp 99.1 F (37.3 C) (Oral)   Resp 16   Ht 5' 0.75" (1.543 m)   Wt 153 lb (69.4 kg)   SpO2 96%   BMI 29.15 kg/m   BP Readings from Last 3 Encounters:  02/20/18 (!) 174/64  09/16/17 139/61  09/10/17 (!) 171/72    Wt Readings from Last 3 Encounters:  02/20/18 153 lb (69.4 kg)  09/16/17 156 lb (70.8 kg)  09/10/17 156 lb (70.8 kg)    Physical Exam  Constitutional: She is oriented to person, place, and time. No distress.  HENT:  Mouth/Throat: Oropharynx is clear and moist. No oropharyngeal exudate.  Eyes: Pupils are equal, round,  and reactive to light. EOM are normal. Right eye exhibits no discharge. Left eye exhibits no discharge. No scleral icterus.  Neck: Normal range of motion. Neck supple. No JVD present. No thyromegaly present.  Cardiovascular: Normal rate and regular rhythm. Exam reveals no friction rub.  Murmur heard.  Systolic murmur is present with a grade of 1/6. Pulses:      Carotid pulses are 1+ on the right side, and 1+ on the left side.      Radial pulses are 1+ on the right side, and 1+ on the left side.       Femoral pulses are 1+ on the right side, and 1+ on the left side.      Popliteal pulses are 1+ on the right side, and 1+ on the left side.       Dorsalis pedis pulses are 1+ on the right side, and 1+ on the left side.       Posterior tibial pulses are 1+ on the right side, and 1+ on the left side.  1/6 SEM  Pulmonary/Chest: Effort normal and breath sounds normal. No respiratory distress. She has no wheezes. She has no rhonchi. She has no rales.  Abdominal: Soft. Bowel sounds are normal. She exhibits no mass. There is no tenderness.  Genitourinary:  Genitourinary Comments: Breast, GU,  rectal exams were deferred at her request.  Musculoskeletal: Normal range of motion. She exhibits no edema, tenderness or deformity.  Lymphadenopathy:    She has no cervical adenopathy.  Neurological: She is alert and oriented to person, place, and time.  Skin: Skin is warm and dry. She is not diaphoretic. No erythema. No pallor.  Psychiatric: Judgment normal. Her mood appears not anxious. Her affect is not inappropriate. Her speech is not rapid and/or pressured, not delayed and not tangential. She is not agitated, not aggressive, not slowed, not withdrawn and not combative. Thought content is not paranoid. Cognition and memory are normal. She exhibits a depressed mood. She expresses no homicidal and no suicidal ideation.  Vitals reviewed.   Lab Results  Component Value Date   WBC 9.4 02/20/2018   HGB 12.2 02/20/2018   HCT 37.9 02/20/2018   PLT 231.0 02/20/2018   GLUCOSE 65 (L) 02/20/2018   CHOL 198 02/20/2018   TRIG 170.0 (H) 02/20/2018   HDL 60.10 02/20/2018   LDLDIRECT 141 (H) 03/04/2010   LDLCALC 104 (H) 02/20/2018   ALT 12 02/20/2018   AST 15 02/20/2018   NA 142 02/20/2018   K 3.4 (L) 02/20/2018   CL 107 02/20/2018   CREATININE 0.95 02/20/2018   BUN 10 02/20/2018   CO2 26 02/20/2018   TSH 10.44 (H) 02/20/2018   INR 1.10 06/12/2015   HGBA1C 15.6 (H) 02/20/2018   MICROALBUR 0.7 02/20/2018    No results found.  Assessment & Plan:   Sonia Skinner was seen today for hypertension, hyperlipidemia, diabetes and annual exam.  Diagnoses and all orders for this visit:  Vitamin D deficiency- Her vitamin D level is in the normal range.  I have asked her to continue taking the vitamin D supplement. -     VITAMIN D 25 Hydroxy (Vit-D Deficiency, Fractures); Future  Essential hypertension, benign- Her BP is too high.  I have asked her to restart Bystolic and will also add on an ARB and thiazide diuretic.  I will check her labs to screen for secondary causes and endorgan damage. -     CBC  with Differential/Platelet; Future -  Urinalysis, Routine w reflex microscopic; Future -     VITAMIN D 25 Hydroxy (Vit-D Deficiency, Fractures); Future -     Aldosterone + renin activity w/ ratio; Future -     Azilsartan-Chlorthalidone 40-12.5 MG TABS; Take 1 tablet by mouth daily. -     nebivolol (BYSTOLIC) 5 MG tablet; Take 1 tablet (5 mg total) by mouth daily.  Acquired hypothyroidism- Her TSH is elevated so I have increased her dose of levothyroxine. -     TSH; Future -     levothyroxine (SYNTHROID) 175 MCG tablet; Take 1 tablet (175 mcg total) by mouth daily before breakfast.  Hyperlipidemia with target LDL less than 70-she has not achieved her LDL goal.  I have asked her to restart the statin. -     Lipid panel; Future -     atorvastatin (LIPITOR) 40 MG tablet; Take 1 tablet (40 mg total) by mouth daily.  Type 2 diabetes mellitus with complication, with long-term current use of insulin ([place])- Her A1c is up to 15.6%.  I have asked her to restart her hypoglycemic agents as well as basal insulin.  Will also refer her for follow-up with endocrinology and diabetic education. -     Comprehensive metabolic panel; Future -     Microalbumin / creatinine urine ratio; Future -     Hemoglobin A1c; Future -     Amb Referral to Nutrition and Diabetic E -     Ambulatory referral to Ophthalmology -     Semaglutide (OZEMPIC) 1 MG/DOSE SOPN; Inject 1 Act into the skin once a week. -     Azilsartan-Chlorthalidone 40-12.5 MG TABS; Take 1 tablet by mouth daily. -     insulin glargine (LANTUS) 100 UNIT/ML injection; Inject 0.6 mLs (60 Units total) into the skin at bedtime. -     Empagliflozin-metFORMIN HCl (SYNJARDY) 12-998 MG TABS; Take 1 tablet by mouth 2 (two) times daily. -     Ambulatory referral to Endocrinology  Cerebrovascular accident (CVA) due to stenosis of left middle cerebral artery ([place])- Will try to improve risk factor modifications. -     atorvastatin (LIPITOR) 40 MG tablet; Take 1  tablet (40 mg total) by mouth daily.  Age-related osteoporosis with current pathological fracture, initial encounter -     DG Bone Density; Future  Visit for screening mammogram -     MM DIGITAL SCREENING BILATERAL; Future  Type 2 diabetes mellitus with proliferative retinopathy and macular edema, with long-term current use of insulin, unspecified laterality (HCC) -     insulin glargine (LANTUS) 100 UNIT/ML injection; Inject 0.6 mLs (60 Units total) into the skin at bedtime. -     Empagliflozin-metFORMIN HCl (SYNJARDY) 12-998 MG TABS; Take 1 tablet by mouth 2 (two) times daily. -     Ambulatory referral to Endocrinology  Hypokalemia -     potassium chloride SA (K-DUR,KLOR-CON) 20 MEQ tablet; Take 1 tablet (20 mEq total) by mouth 2 (two) times daily.  Hyperlipidemia, unspecified hyperlipidemia type   I have discontinued Karen Chafe. Stites's nebivolol, amLODipine, nicotine, levothyroxine, HYDROcodone-acetaminophen, exenatide, NOVOLOG FLEXPEN, BYSTOLIC, loratadine, and fluconazole. I have also changed her atorvastatin. Additionally, I am having her start on Semaglutide, Azilsartan-Chlorthalidone, nebivolol, levothyroxine, and potassium chloride SA. Lastly, I am having her maintain her aspirin, Cholecalciferol, gabapentin, Cyanocobalamin (VITAMIN B-12 CR PO), glucose blood, escitalopram, DEXILANT, insulin lispro, lamoTRIgine, amitriptyline, insulin glargine, and Empagliflozin-metFORMIN HCl.  Meds ordered this encounter  Medications  . Semaglutide (OZEMPIC) 1 MG/DOSE SOPN  Sig: Inject 1 Act into the skin once a week.    Dispense:  12 pen    Refill:  0  . Azilsartan-Chlorthalidone 40-12.5 MG TABS    Sig: Take 1 tablet by mouth daily.    Dispense:  35 tablet    Refill:  0  . nebivolol (BYSTOLIC) 5 MG tablet    Sig: Take 1 tablet (5 mg total) by mouth daily.    Dispense:  63 tablet    Refill:  0  . levothyroxine (SYNTHROID) 175 MCG tablet    Sig: Take 1 tablet (175 mcg total) by mouth daily  before breakfast.    Dispense:  90 tablet    Refill:  0  . insulin glargine (LANTUS) 100 UNIT/ML injection    Sig: Inject 0.6 mLs (60 Units total) into the skin at bedtime.    Dispense:  10 mL    Refill:  11  . Empagliflozin-metFORMIN HCl (SYNJARDY) 12-998 MG TABS    Sig: Take 1 tablet by mouth 2 (two) times daily.    Dispense:  180 tablet    Refill:  1  . potassium chloride SA (K-DUR,KLOR-CON) 20 MEQ tablet    Sig: Take 1 tablet (20 mEq total) by mouth 2 (two) times daily.    Dispense:  180 tablet    Refill:  1  . atorvastatin (LIPITOR) 40 MG tablet    Sig: Take 1 tablet (40 mg total) by mouth daily.    Dispense:  90 tablet    Refill:  1   See AVS for instructions about healthy living and anticipatory guidance.  Follow-up: Return in about 1 month (around 03/20/2018).  Scarlette Calico, MD

## 2018-02-21 ENCOUNTER — Other Ambulatory Visit: Payer: Self-pay

## 2018-02-21 ENCOUNTER — Encounter (HOSPITAL_COMMUNITY): Payer: Self-pay | Admitting: Emergency Medicine

## 2018-02-21 ENCOUNTER — Emergency Department (HOSPITAL_COMMUNITY)
Admission: EM | Admit: 2018-02-21 | Discharge: 2018-02-22 | Disposition: A | Discharge status: Home / Self Care | Payer: Medicare Other | Attending: Emergency Medicine | Admitting: Emergency Medicine

## 2018-02-21 DIAGNOSIS — Z79899 Other long term (current) drug therapy: Secondary | ICD-10-CM | POA: Diagnosis not present

## 2018-02-21 DIAGNOSIS — Z794 Long term (current) use of insulin: Secondary | ICD-10-CM | POA: Insufficient documentation

## 2018-02-21 DIAGNOSIS — E119 Type 2 diabetes mellitus without complications: Secondary | ICD-10-CM | POA: Insufficient documentation

## 2018-02-21 DIAGNOSIS — Z87891 Personal history of nicotine dependence: Secondary | ICD-10-CM | POA: Diagnosis not present

## 2018-02-21 DIAGNOSIS — I10 Essential (primary) hypertension: Secondary | ICD-10-CM | POA: Insufficient documentation

## 2018-02-21 LAB — CBC WITH DIFFERENTIAL/PLATELET
Abs Immature Granulocytes: 0.1 10*3/uL (ref 0.0–0.1)
Basophils Absolute: 0.1 10*3/uL (ref 0.0–0.1)
Basophils Relative: 1 %
Eosinophils Absolute: 0.3 10*3/uL (ref 0.0–0.7)
Eosinophils Relative: 3 %
HCT: 45.3 % (ref 36.0–46.0)
Hemoglobin: 13.4 g/dL (ref 12.0–15.0)
Immature Granulocytes: 1 %
Lymphocytes Relative: 30 %
Lymphs Abs: 3.6 10*3/uL (ref 0.7–4.0)
MCH: 27.1 pg (ref 26.0–34.0)
MCHC: 29.6 g/dL — ABNORMAL LOW (ref 30.0–36.0)
MCV: 91.5 fL (ref 78.0–100.0)
Monocytes Absolute: 0.6 10*3/uL (ref 0.1–1.0)
Monocytes Relative: 5 %
Neutro Abs: 7 10*3/uL (ref 1.7–7.7)
Neutrophils Relative %: 60 %
Platelets: 248 10*3/uL (ref 150–400)
RBC: 4.95 MIL/uL (ref 3.87–5.11)
RDW: 14.9 % (ref 11.5–15.5)
WBC: 11.7 10*3/uL — ABNORMAL HIGH (ref 4.0–10.5)

## 2018-02-21 LAB — COMPREHENSIVE METABOLIC PANEL
ALT: 15 U/L (ref 0–44)
AST: 16 U/L (ref 15–41)
Albumin: 3.8 g/dL (ref 3.5–5.0)
Alkaline Phosphatase: 133 U/L — ABNORMAL HIGH (ref 38–126)
Anion gap: 10 (ref 5–15)
BUN: 6 mg/dL — ABNORMAL LOW (ref 8–23)
CO2: 25 mmol/L (ref 22–32)
Calcium: 9.8 mg/dL (ref 8.9–10.3)
Chloride: 103 mmol/L (ref 98–111)
Creatinine, Ser: 0.91 mg/dL (ref 0.44–1.00)
GFR calc Af Amer: >60 mL/min (ref >60)
GFR calc non Af Amer: >60 mL/min (ref >60)
Glucose, Bld: 56 mg/dL — ABNORMAL LOW (ref 70–99)
Potassium: 3 mmol/L — ABNORMAL LOW (ref 3.5–5.1)
Sodium: 138 mmol/L (ref 135–145)
Total Bilirubin: 0.5 mg/dL (ref 0.3–1.2)
Total Protein: 7.3 g/dL (ref 6.5–8.1)

## 2018-02-21 NOTE — ED Notes (Signed)
Pt eating graham crackers and drinking ginger ale at this time

## 2018-02-21 NOTE — ED Provider Notes (Signed)
Patient placed in Quick Look pathway, seen and evaluated   Chief Complaint: htn  HPI:   Pt seen by PCP yesterday for regular check up, was noted to have elevated BP.  D/c home with medication.  Now endorse throbbing headache all day and was concerned.    ROS: no chest pain (one)  Physical Exam:   Gen: No distress  Neuro: Awake and Alert  Skin: Warm    Focused Exam: no nuchal rigidity.  Heart RRR, no MRG, lungs CTAB   Initiation of care has begun. The patient has been counseled on the process, plan, and necessity for staying for the completion/evaluation, and the remainder of the medical screening examination    Doy Hutching 02/21/18 1904    Noemi Chapel, MD 02/24/18 [phone]

## 2018-02-21 NOTE — ED Notes (Signed)
Pt st's she took her blood pressure at home and it was very high even after taking her blood pressure meds.  Pt st's she also has a headache but does have hx of headaches.  Pt alert and oriented x's 3

## 2018-02-21 NOTE — ED Triage Notes (Signed)
Pt reports she has High BP 180/130 at PCP yesterday, pt reports PCP changed her medications. Pt reports last night she developed a  Throbbing headache to back of head. Pt denies CP. Pt has hx of previous stroke 4 years ago.

## 2018-02-21 NOTE — Discharge Instructions (Addendum)
Take your blood pressure in the afternoon and at bedtime. Write them down and show your doctor in the next few days of week. Take the new medicine as prescribed.

## 2018-02-22 ENCOUNTER — Encounter: Payer: Self-pay | Admitting: Neurology

## 2018-02-22 NOTE — ED Provider Notes (Signed)
[facility] EMERGENCY DEPARTMENT Provider Note   CSN: [id number] Arrival date & time: 02/21/18  1842     History   Chief Complaint Chief Complaint  Patient presents with  . Hypertension    HPI Sonia Skinner is a 67 y.o. female.  Patient presents to the emergency department for evaluation of elevated blood pressure.  Patient does have a history of hypertension.  She was seen at her primary care doctor's office yesterday and her blood pressure was markedly elevated.  She had Azilsarta-chlorthalidone added to her normal by systolic.  Patient reports that she checked her pressure tonight and it was 180s over 110s, this concerned her so she came to the ER.  She did have a slight headache, but this was a typical headache for her.  No other stroke symptoms associated with a headache.  Headache has improved without intervention.     Past Medical History:  Diagnosis Date  . Ankle fracture    Left  . Anxiety   . Carotid artery occlusion   . Closed fracture of left distal fibula 09/16/2017  . Complication of anesthesia    ALLERY TO ESTER BASE  . Depression    early 56s  . Diabetes mellitus    INSULIN DEPENDENT  . GERD (gastroesophageal reflux disease)   . Headache    years ago  . Hypertension   . Pneumonia   . Stroke [facility]) March 2015    left MCA infarct, slight weakness on left side  . Thyroid disease     Patient Active Problem List   Diagnosis Date Noted  . Age-related osteoporosis with current pathological fracture 02/20/2018  . Routine general medical examination at a health care facility 02/20/2018  . Poor short term memory 02/02/2018  . Hypokalemia 11/25/2016  . Intractable migraine without aura and with status migrainosus 11/24/2016  . Left thyroid nodule 05/05/2016  . GERD (gastroesophageal reflux disease) 03/08/2016  . Major depressive disorder, recurrent episode ([place]) 02/20/2016  . Occlusion and stenosis of carotid artery with cerebral infarction  11/25/2013  . Cerebrovascular accident (CVA) due to stenosis of left middle cerebral artery ([place]) 11/02/2013  . Vitamin D deficiency 10/19/2013  . Tobacco abuse 07/31/2013  . Essential hypertension, benign 04/20/2013  . Type II diabetes mellitus with manifestations ([place]) 04/20/2013  . Hypothyroidism 04/20/2013  . Hyperlipidemia with target LDL less than 70 04/20/2013    Past Surgical History:  Procedure Laterality Date  . ABDOMINAL HYSTERECTOMY    . ANTERIOR FIXATION AND POSTERIOR MICRODISCECTOMY CERVICAL SPINE  1999  . CAROTID ENDARTERECTOMY Left 11-04-13   cea  . ENDARTERECTOMY Left 11/04/2013  . ORIF ANKLE FRACTURE Left 09/16/2017   Procedure: OPEN REDUCTION INTERNAL FIXATION (ORIF) LEFT ANKLE FRACTURE;  Surgeon: Marchia Bond, MD;  Location: [facility];  Service: Orthopedics;  Laterality: Left;     OB History   None      Home Medications    Prior to Admission medications   Medication Sig Start Date End Date Taking? Authorizing Provider  amitriptyline (ELAVIL) 50 MG tablet Take 1 tablet (50 mg total) by mouth at bedtime. 01/31/18  Yes Arfeen, Arlyce Harman, MD  aspirin 325 MG tablet Take 1 tablet (325 mg total) by mouth daily. 11/06/13  Yes Samella Parr, NP  atorvastatin (LIPITOR) 40 MG tablet Take 1 tablet (40 mg total) by mouth daily. 02/20/18  Yes [name], MD  Azilsartan-Chlorthalidone 40-12.5 MG TABS Take 1 tablet by mouth daily. 02/20/18  Yes [name],  MD  Cholecalciferol 2000 units TABS Take 1 tablet (2,000 Units total) by mouth daily. 11/25/16  Yes [name], MD  Cyanocobalamin (VITAMIN B-12 CR PO) Take 1 tablet by mouth daily.    Yes [provider]  DEXILANT 60 MG capsule TAKE 1 CAPSULE BY MOUTH ONCE DAILY 11/02/17  Yes [name], MD  Empagliflozin-metFORMIN HCl (SYNJARDY) 12-998 MG TABS Take 1 tablet by mouth 2 (two) times daily. 02/20/18  Yes [name], MD  escitalopram (LEXAPRO) 20 MG tablet Take 1 tablet (20 mg total) by mouth daily. 10/11/17  10/11/18 Yes Arfeen, Arlyce Harman, MD  gabapentin (NEURONTIN) 100 MG capsule Take 1 capsule (100 mg total) by mouth 2 (two) times daily. 08/05/17  Yes [name], MD  insulin glargine (LANTUS) 100 UNIT/ML injection Inject 0.6 mLs (60 Units total) into the skin at bedtime. 02/20/18  Yes [name], MD  insulin lispro (HUMALOG) 100 UNIT/ML KiwkPen Inject into skin subcutaneously 8 units at breakfast, 4 units at lunch and 30 untis at supper. Pen needles 4x daily. 11/11/17  Yes Renato Shin, MD  lamoTRIgine (LAMICTAL) 25 MG tablet Take one tab daily for 1 week and than 2 tab daily Patient taking differently: Take 50 mg by mouth daily.  01/31/18  Yes Arfeen, Arlyce Harman, MD  levothyroxine (SYNTHROID) 175 MCG tablet Take 1 tablet (175 mcg total) by mouth daily before breakfast. 02/20/18  Yes [name], MD  nebivolol (BYSTOLIC) 5 MG tablet Take 1 tablet (5 mg total) by mouth daily. 02/20/18  Yes [name], MD  potassium chloride SA (K-DUR,KLOR-CON) 20 MEQ tablet Take 1 tablet (20 mEq total) by mouth 2 (two) times daily. 02/20/18  Yes [name], MD  Semaglutide [facility]) 1 MG/DOSE SOPN Inject 1 Act into the skin once a week. 02/20/18  Yes [name], MD  glucose blood (ACCU-CHEK GUIDE) test strip Use to check blood sugars twice a day 10/04/17   [name], MD    Family History Family History  Problem Relation Age of Onset  . Diabetes Mother   . Heart disease Mother        Before age 71  . Cancer Father        Lung  . Hypertension Sister   . Diabetes Sister   . Diabetes Sister   . Diabetes Sister     Social History Social History   Tobacco Use  . Smoking status: Former Smoker    Packs/day: 0.20    Years: 20.00    Pack years: 4.00    Types: Cigarettes    Last attempt to quit: 11/01/2013    Years since quitting: 4.3  . Smokeless tobacco: Never Used  . Tobacco comment: Reports done to 1-2 a day.   Substance Use Topics  . Alcohol use: Yes    Alcohol/week: 0.6 - 1.2 oz     Types: 1 - 2 Standard drinks or equivalent per week  . Drug use: No     Allergies   Anesthetics, ester   Review of Systems Review of Systems  Respiratory: Negative for shortness of breath.   Cardiovascular: Negative for chest pain.  Neurological: Positive for headaches.  All other systems reviewed and are negative.    Physical Exam Updated Vital Signs BP (!) 161/61 (BP Location: Left Arm)   Pulse 83   Temp 98.2 F (36.8 C) (Oral)   Resp 16   Ht 5\' 1"  (1.549 m)   Wt 63.5 kg (  140 lb)   SpO2 91%   BMI 26.45 kg/m   Physical Exam  Constitutional: She is oriented to person, place, and time. She appears well-developed and well-nourished. No distress.  HENT:  Head: Normocephalic and atraumatic.  Right Ear: Hearing normal.  Left Ear: Hearing normal.  Nose: Nose normal.  Mouth/Throat: Oropharynx is clear and moist and mucous membranes are normal.  Eyes: Pupils are equal, round, and reactive to light. Conjunctivae and EOM are normal.  Neck: Normal range of motion. Neck supple.  Cardiovascular: Regular rhythm, S1 normal and S2 normal. Exam reveals no gallop and no friction rub.  No murmur heard. Pulmonary/Chest: Effort normal and breath sounds normal. No respiratory distress. She exhibits no tenderness.  Abdominal: Soft. Normal appearance and bowel sounds are normal. There is no hepatosplenomegaly. There is no tenderness. There is no rebound, no guarding, no tenderness at McBurney's point and negative Murphy's sign. No hernia.  Musculoskeletal: Normal range of motion.  Neurological: She is alert and oriented to person, place, and time. She has normal strength. No cranial nerve deficit or sensory deficit. Coordination normal. GCS eye subscore is 4. GCS verbal subscore is 5. GCS motor subscore is 6.  Skin: Skin is warm, dry and intact. No rash noted. No cyanosis.  Psychiatric: She has a normal mood and affect. Her speech is normal and behavior is normal. Thought content normal.    Nursing note and vitals reviewed.    ED Treatments / Results  Labs (all labs ordered are listed, but only abnormal results are displayed) Labs Reviewed  CBC WITH DIFFERENTIAL/PLATELET - Abnormal; Notable for the following components:      Result Value   WBC 11.7 (*)    MCHC 29.6 (*)    All other components within normal limits  COMPREHENSIVE METABOLIC PANEL - Abnormal; Notable for the following components:   Potassium 3.0 (*)    Glucose, Bld 56 (*)    BUN 6 (*)    Alkaline Phosphatase 133 (*)    All other components within normal limits  CBG MONITORING, ED    EKG EKG Interpretation  Date/Time:  Tuesday February 21 2018 19:04:51 EDT Ventricular Rate:  78 PR Interval:  152 QRS Duration: 80 QT Interval:  366 QTC Calculation: 417 R Axis:   14 Text Interpretation:  Normal sinus rhythm Possible Left atrial enlargement Possible Anterior infarct , age undetermined Abnormal ECG since last tracing no significant change Confirmed by Noemi Chapel [phone]) on 02/21/2018 9:52:59 PM   Radiology No results found.  Procedures Procedures (including critical care time)  Medications Ordered in ED Medications - No data to display   Initial Impression / Assessment and Plan / ED Course  I have reviewed the triage vital signs and the nursing notes.  Pertinent labs & imaging results that were available during my care of the patient were reviewed by me and considered in my medical decision making (see chart for details).     Patient presents for concern over elevated blood pressure.  She does have a history of known essential hypertension, recently had blood pressure agents added to her medication regimen for uncontrolled hypertension by her primary doctor.  Blood pressure was very elevated at arrival, but has slowly improved here.  She had a slight headache but this has improved.  Her neurologic exam is normal.  Since she has only been on the new medication for 1 day, I do not feel that she  requires any medication changes at this time.  She will  continue her normal medications and I have recommended that she check her blood pressure in the afternoon and at bedtime.  I am not sure if her blood pressures are getting elevated at night because she takes all of her medicines in the morning.  She will follow-up with her primary doctor with these readings for further consideration.  Return for any worsening symptoms.  Patient's blood sugar was low.  She does have a history of diabetes.  She denies any mental status changes with the low blood sugar.  She has eaten a full meal here in the ER and has continued to do well.  Final Clinical Impressions(s) / ED Diagnoses   Final diagnoses:  Essential hypertension    ED Discharge Orders    None       Naleigha Raimondi, Gwenyth Allegra, MD 02/22/18 [id number]

## 2018-02-23 ENCOUNTER — Other Ambulatory Visit: Payer: Self-pay | Admitting: Internal Medicine

## 2018-02-23 DIAGNOSIS — Z794 Long term (current) use of insulin: Principal | ICD-10-CM

## 2018-02-23 DIAGNOSIS — I10 Essential (primary) hypertension: Secondary | ICD-10-CM

## 2018-02-23 DIAGNOSIS — E118 Type 2 diabetes mellitus with unspecified complications: Secondary | ICD-10-CM

## 2018-02-25 LAB — ALDOSTERONE + RENIN ACTIVITY W/ RATIO
ALDO / PRA Ratio: 6.5 Ratio (ref 0.9–28.9)
Aldosterone: 6 ng/dL
Renin Activity: 0.93 ng/mL/h (ref 0.25–5.82)

## 2018-02-27 ENCOUNTER — Ambulatory Visit (HOSPITAL_COMMUNITY): Payer: Self-pay | Admitting: Psychiatry

## 2018-03-03 ENCOUNTER — Ambulatory Visit (INDEPENDENT_AMBULATORY_CARE_PROVIDER_SITE_OTHER): Payer: Medicare Other | Admitting: Neurology

## 2018-03-03 ENCOUNTER — Other Ambulatory Visit: Payer: Self-pay

## 2018-03-03 ENCOUNTER — Encounter: Payer: Self-pay | Admitting: Neurology

## 2018-03-03 VITALS — BP 142/60 | HR 84 | Ht 61.0 in | Wt 147.0 lb

## 2018-03-03 DIAGNOSIS — G3184 Mild cognitive impairment, so stated: Secondary | ICD-10-CM | POA: Diagnosis not present

## 2018-03-03 DIAGNOSIS — Z8673 Personal history of transient ischemic attack (TIA), and cerebral infarction without residual deficits: Secondary | ICD-10-CM

## 2018-03-03 DIAGNOSIS — E0865 Diabetes mellitus due to underlying condition with hyperglycemia: Secondary | ICD-10-CM | POA: Diagnosis not present

## 2018-03-03 DIAGNOSIS — E039 Hypothyroidism, unspecified: Secondary | ICD-10-CM

## 2018-03-03 NOTE — Progress Notes (Signed)
NEUROLOGY CONSULTATION NOTE  THOMASINA HOUSLEY MRN: [id number] DOB: 06-20-1951  Referring provider: Dr. Scarlette Calico Primary care provider: Dr. Scarlette Calico  Reason for consult:  Memory loss  Dear Dr Ronnald Ramp:  Thank you for your kind referral of Sonia Skinner for consultation of the above symptoms. Although her history is well known to you, please allow me to reiterate it for the purpose of our medical record. She is alone in the office today. Records and images were personally reviewed where available.  HISTORY OF PRESENT ILLNESS: This is a 67 year old left-handed woman with a history of left MCA stroke secondary to left ICA stenosis s/p CEA in 2015, hypertension, hyperlipidemia, diabetes, hypothyroidism, presenting for evaluation of worsening memory. She had been seeing neurologist Dr. Leonie Man for several years and had reported memory difficulties since her stroke, and underwent Neuropsychological testing with Dr. Valentina Shaggy in April 2016, it was noted that "cognitive difficulties are likely due to a combination of psychological and neurological factors. It is probable that depression, fatigue, anxiety, and frustration, which to some extent pre-dated her stroke, are significantly disrupting her everyday cognitive functioning. While her neuropsychological deficit profile was indicative of a right brain stroke, neuroradiological studies showed left hemisphere infarcts. This discrepancy cannot be explained. The intactness of her language functioning might be explained by assuming that this left-handed woman is right brain dominant for language. Finally, her diabetes, especially if poorly controlled, could also be contributing to her cognitive difficulties." She reports that she loses her train of thought at least once a day. She misplaces things frequently. She has been told by her sister that she repeats herself. She forgot her doctor's appointment last week. She has missed a bill payment once or twice. She  occasionally misses medications, if she does not keep the pillbox in front of her, she would forget it. Her last HbA1c was very elevated 15.6. She denies getting lost driving. No family history of dementia or stroke. She recalls a head injury 10 years ago where she hit a wall and broke her teeth. She rarely drinks alcohol. Although the stroke was in the left MCA territory, she reports that it feels that her left side is affected more. Her handwriting is different, she has numbness, tingling, and slight weakness in her left hand and leg. She has headaches in the occipital region with throbbing or sharp/stabbing pain 1-2 times a week, lasting up to an hour. No associated nausea/vomiting, vision changes. She does not take any medication for the headache. She has dizziness that she believes is related to her boot on the left foot. She broke her ankle in January 2019 and continues to wear a boot. She has occasional difficulty swallowing, occasional blurred vision. She denies any diplopia, dysarthria, neck/back pain, bowel/bladder dysfunction, anosmia, or tremors. She is scheduled to see Vascular for follow-up on carotid arteries at the end of the month. Mood is not so good.   Laboratory Data: Lab Results  Component Value Date   TSH 10.44 (H) 02/20/2018   Lab Results  Component Value Date   VITAMINB12 >1500 (H) 11/24/2016   Lab Results  Component Value Date   HGBA1C 15.6 (H) 02/20/2018    PAST MEDICAL HISTORY: Past Medical History:  Diagnosis Date  . Ankle fracture    Left  . Anxiety   . Carotid artery occlusion   . Closed fracture of left distal fibula 09/16/2017  . Complication of anesthesia    ALLERY TO ESTER BASE  . Depression  early 72s  . Diabetes mellitus    INSULIN DEPENDENT  . GERD (gastroesophageal reflux disease)   . Headache    years ago  . Hypertension   . Pneumonia   . Stroke [facility]) March 2015    left MCA infarct, slight weakness on left side  . Thyroid disease     PAST  SURGICAL HISTORY: Past Surgical History:  Procedure Laterality Date  . ABDOMINAL HYSTERECTOMY    . ANTERIOR FIXATION AND POSTERIOR MICRODISCECTOMY CERVICAL SPINE  1999  . CAROTID ENDARTERECTOMY Left 11-04-13   cea  . ENDARTERECTOMY Left 11/04/2013  . ORIF ANKLE FRACTURE Left 09/16/2017   Procedure: OPEN REDUCTION INTERNAL FIXATION (ORIF) LEFT ANKLE FRACTURE;  Surgeon: Marchia Bond, MD;  Location: [facility];  Service: Orthopedics;  Laterality: Left;    MEDICATIONS: Current Outpatient Medications on File Prior to Visit  Medication Sig Dispense Refill  . amitriptyline (ELAVIL) 50 MG tablet Take 1 tablet (50 mg total) by mouth at bedtime. 30 tablet 0  . aspirin 325 MG tablet Take 1 tablet (325 mg total) by mouth daily.    Marland Kitchen atorvastatin (LIPITOR) 40 MG tablet Take 1 tablet (40 mg total) by mouth daily. 90 tablet 1  . Azilsartan-Chlorthalidone 40-12.5 MG TABS Take 1 tablet by mouth daily. 35 tablet 0  . Cholecalciferol 2000 units TABS Take 1 tablet (2,000 Units total) by mouth daily. 90 tablet 3  . Cyanocobalamin (VITAMIN B-12 CR PO) Take 1 tablet by mouth daily.     Marland Kitchen DEXILANT 60 MG capsule TAKE 1 CAPSULE BY MOUTH ONCE DAILY 90 capsule 1  . Empagliflozin-metFORMIN HCl (SYNJARDY) 12-998 MG TABS Take 1 tablet by mouth 2 (two) times daily. 180 tablet 1  . escitalopram (LEXAPRO) 20 MG tablet Take 1 tablet (20 mg total) by mouth daily. 90 tablet 0  . gabapentin (NEURONTIN) 100 MG capsule Take 1 capsule (100 mg total) by mouth 2 (two) times daily. 180 capsule 1  . glucose blood (ACCU-CHEK GUIDE) test strip Use to check blood sugars twice a day 100 each 3  . insulin glargine (LANTUS) 100 UNIT/ML injection Inject 0.6 mLs (60 Units total) into the skin at bedtime. 10 mL 11  . insulin lispro (HUMALOG) 100 UNIT/ML KiwkPen Inject into skin subcutaneously 8 units at breakfast, 4 units at lunch and 30 untis at supper. Pen needles 4x daily. 15 pen 11  . lamoTRIgine (LAMICTAL) 25 MG tablet Take one tab daily for 1  week and than 2 tab daily (Patient taking differently: Take 50 mg by mouth daily. ) 60 tablet 0  . levothyroxine (SYNTHROID) 175 MCG tablet Take 1 tablet (175 mcg total) by mouth daily before breakfast. 90 tablet 0  . nebivolol (BYSTOLIC) 5 MG tablet Take 1 tablet (5 mg total) by mouth daily. 63 tablet 0  . potassium chloride SA (K-DUR,KLOR-CON) 20 MEQ tablet Take 1 tablet (20 mEq total) by mouth 2 (two) times daily. 180 tablet 1  . Semaglutide (OZEMPIC) 1 MG/DOSE SOPN Inject 1 Act into the skin once a week. 12 pen 0  . telmisartan (MICARDIS) 40 MG tablet TAKE 1 TABLET BY MOUTH ONCE DAILY 90 tablet 1   No current facility-administered medications on file prior to visit.     ALLERGIES: Allergies  Allergen Reactions  . Anesthetics, Ester Anaphylaxis    FAMILY HISTORY: Family History  Problem Relation Age of Onset  . Diabetes Mother   . Heart disease Mother        Before age 27  . Cancer  Father        Lung  . Hypertension Sister   . Diabetes Sister   . Diabetes Sister   . Diabetes Sister     SOCIAL HISTORY: Social History   Socioeconomic History  . Marital status: Legally Separated    Spouse name: Not on file  . Number of children: 0  . Years of education: College  . Highest education level: Not on file  Occupational History  . Occupation: [employer]    Employer: [employer]  Social Needs  . Financial resource strain: Not on file  . Food insecurity:    Worry: Not on file    Inability: Not on file  . Transportation needs:    Medical: Not on file    Non-medical: Not on file  Tobacco Use  . Smoking status: Former Smoker    Packs/day: 0.20    Years: 20.00    Pack years: 4.00    Types: Cigarettes    Last attempt to quit: 11/01/2013    Years since quitting: 4.3  . Smokeless tobacco: Never Used  . Tobacco comment: Reports done to 1-2 a day.   Substance and Sexual Activity  . Alcohol use: Yes    Alcohol/week: 0.6 - 1.2 oz    Types: 1 - 2 Standard drinks or equivalent  per week  . Drug use: No  . Sexual activity: Not Currently  Lifestyle  . Physical activity:    Days per week: Not on file    Minutes per session: Not on file  . Stress: Not on file  Relationships  . Social connections:    Talks on phone: Not on file    Gets together: Not on file    Attends religious service: Not on file    Active member of club or organization: Not on file    Attends meetings of clubs or organizations: Not on file    Relationship status: Not on file  . Intimate partner violence:    Fear of current or ex partner: Not on file    Emotionally abused: Not on file    Physically abused: Not on file    Forced sexual activity: Not on file  Other Topics Concern  . Not on file  Social History Narrative   Patient lives at home with sister.   Caffeine Use: 2 cups daily; sodas occasionally    REVIEW OF SYSTEMS: Constitutional: No fevers, chills, or sweats, no generalized fatigue, change in appetite Eyes: No visual changes, double vision, eye pain Ear, nose and throat: No hearing loss, ear pain, nasal congestion, sore throat Cardiovascular: No chest pain, palpitations Respiratory:  No shortness of breath at rest or with exertion, wheezes GastrointestinaI: No nausea, vomiting, diarrhea, abdominal pain, fecal incontinence Genitourinary:  No dysuria, urinary retention or frequency Musculoskeletal:  No neck pain, back pain Integumentary: No rash, pruritus, skin lesions Neurological: as above Psychiatric: No depression, insomnia, anxiety Endocrine: No palpitations, fatigue, diaphoresis, mood swings, change in appetite, change in weight, increased thirst Hematologic/Lymphatic:  No anemia, purpura, petechiae. Allergic/Immunologic: no itchy/runny eyes, nasal congestion, recent allergic reactions, rashes  PHYSICAL EXAM: Vitals:   03/03/18 1255  BP: (!) 142/60  Pulse: 84  SpO2: 98%   General: No acute distress Head:  Normocephalic/atraumatic Eyes: Fundoscopic exam shows  bilateral sharp discs, no vessel changes, exudates, or hemorrhages Neck: supple, no paraspinal tenderness, full range of motion Back: No paraspinal tenderness Heart: regular rate and rhythm Lungs: Clear to auscultation bilaterally. Vascular: No carotid bruits. Skin/Extremities: No  rash, no edema, left foot in boot Neurological Exam: Mental status: alert and oriented to person, place, and time, no dysarthria or aphasia, Fund of knowledge is appropriate.  Remote memory intact.  Attention and concentration are normal.    Able to name objects and repeat phrases. CDT 5/5. Able to name 18 words starting with F in 1 minute (nl >11). MMSE - Mini Mental State Exam 03/03/2018  Orientation to time 5  Orientation to Place 5  Registration 3  Attention/ Calculation 5  Recall 1  Language- name 2 objects 2  Language- repeat 1  Language- follow 3 step command 3  Language- read & follow direction 1  Write a sentence 1  Copy design 1  Total score 28   Cranial nerves: CN I: not tested CN II: pupils equal, round and reactive to light, visual fields intact, fundi unremarkable. CN III, IV, VI:  full range of motion, no nystagmus, no ptosis CN V: facial sensation intact CN VII: upper and lower face symmetric CN VIII: hearing intact to finger rub CN IX, X: gag intact, uvula midline CN XI: sternocleidomastoid and trapezius muscles intact CN XII: tongue midline Bulk & Tone: normal, no fasciculations. Motor: 5/5 throughout with no pronator drift, unable to test left foot (in boot) Sensation: intact to light touch, cold, pin on both UE and LE, decreased vibration to right knee.  No extinction to double simultaneous stimulation.  Romberg test negative Deep Tendon Reflexes: unable to elicit throughout, no ankle clonus Plantar responses: downgoing on right Cerebellar: no incoordination on finger to nose testing Gait: slow and cautious with left boot, no ataxia Tremor: none  IMPRESSION: This is a 67 year  old left-handed woman with a history left MCA stroke secondary to left ICA stenosis s/p CEA in 2015, hypertension, hyperlipidemia, diabetes, hypothyroidism, presenting with worsening memory. She had been reporting memory difficulties since her stroke and underwent Neuropsych testing in 2016 which indicated cognitive difficulties are likely due to a combination of psychological and neurological factors. It was also noted that her diabetes, especially if poorly controlled, could also be contributing to her cognitive difficulties. Her neurological exam today is non-focal except for length-dependent neuropathy, MMSE normal 28/30. We discussed different causes of memory changes, review of bloodwork indicate abnormal TSH, significantly elevated HbA1c, we discussed how these can affect cognition and to continue working with her PCP with medication and lifestyle modification. We discussed how depression can also cause cognitive changes, continue working with psychiatry. We discussed re-evaluation in 6 months, if no improvement in cognition despite addressing treatable causes of memory loss, we will plan for repeat Neuropsych testing. We discussed the importance of control of vascular risk factors for secondary stroke prevention, the importance of physical exercise and brain stimulation exercises for brain health.   Thank you for allowing me to participate in the care of this patient. Please do not hesitate to call for any questions or concerns.   Ellouise Newer, M.D.  CC: Dr. Ronnald Ramp

## 2018-03-03 NOTE — Patient Instructions (Signed)
We discussed different causes of memory loss, in your case, the elevated sugar levels (HBA1c of 15.6), thyroid levels, and depression are conditions that can cause memory issues. Let's work on getting them better controlled and re-evaluate in 6 months. Start using an alarm as a reminder for medications or get family help so we can get better control of sugar levels.   RECOMMENDATIONS FOR ALL PATIENTS WITH MEMORY PROBLEMS: 1. Continue to exercise (Recommend 30 minutes of walking everyday, or 3 hours every week) 2. Increase social interactions - continue going to [place] and enjoy social gatherings with friends and family 3. Eat healthy, avoid fried foods and eat more fruits and vegetables 4. Maintain adequate blood pressure, blood sugar, and blood cholesterol level. Reducing the risk of stroke and cardiovascular disease also helps promoting better memory. 5. Avoid stressful situations. Live a simple life and avoid aggravations. Organize your time and prepare for the next day in anticipation. 6. Sleep well, avoid any interruptions of sleep and avoid any distractions in the bedroom that may interfere with adequate sleep quality 7. Avoid sugar, avoid sweets as there is a strong link between excessive sugar intake, diabetes, and cognitive impairment We discussed the Mediterranean diet, which has been shown to help patients reduce the risk of progressive memory disorders and reduces cardiovascular risk. This includes eating fish, eat fruits and green leafy vegetables, nuts like almonds and hazelnuts, walnuts, and also use olive oil. Avoid fast foods and fried foods as much as possible. Avoid sweets and sugar as sugar use has been linked to worsening of memory function.

## 2018-03-10 ENCOUNTER — Encounter: Payer: Self-pay | Admitting: Neurology

## 2018-03-14 ENCOUNTER — Ambulatory Visit (HOSPITAL_COMMUNITY)
Admission: RE | Admit: 2018-03-14 | Discharge: 2018-03-14 | Disposition: A | Discharge status: Home / Self Care | Payer: Medicare Other | Source: Ambulatory Visit | Attending: Family | Admitting: Family

## 2018-03-14 ENCOUNTER — Other Ambulatory Visit: Payer: Self-pay | Admitting: Internal Medicine

## 2018-03-14 DIAGNOSIS — I6522 Occlusion and stenosis of left carotid artery: Secondary | ICD-10-CM | POA: Diagnosis not present

## 2018-03-14 DIAGNOSIS — E559 Vitamin D deficiency, unspecified: Secondary | ICD-10-CM

## 2018-03-15 ENCOUNTER — Encounter: Payer: Self-pay | Admitting: Vascular Surgery

## 2018-03-15 ENCOUNTER — Ambulatory Visit (INDEPENDENT_AMBULATORY_CARE_PROVIDER_SITE_OTHER): Payer: Medicare Other | Admitting: Vascular Surgery

## 2018-03-15 ENCOUNTER — Other Ambulatory Visit: Payer: Self-pay

## 2018-03-15 VITALS — BP 123/61 | HR 91 | Temp 98.4°F | Resp 16 | Ht 63.0 in | Wt 139.0 lb

## 2018-03-15 DIAGNOSIS — I6522 Occlusion and stenosis of left carotid artery: Secondary | ICD-10-CM | POA: Diagnosis not present

## 2018-03-15 DIAGNOSIS — S82842D Displaced bimalleolar fracture of left lower leg, subsequent encounter for closed fracture with routine healing: Secondary | ICD-10-CM | POA: Diagnosis not present

## 2018-03-15 NOTE — Progress Notes (Signed)
Patient name: Sonia Skinner MRN: [id number] DOB: 05/29/1951 Sex: female  REASON FOR VISIT:   Follow-up of carotid disease.  HPI:   Sonia Skinner is a pleasant 67 y.o. female who underwent a left carotid endarterectomy for symptomatic left carotid stenosis in 2015.  She also has some carotid siphon disease bilaterally.  When I last saw her she had a less than 50% right carotid stenosis.  She comes in for her yearly follow-up duplex scan.  Since I saw her last, she denies any history of stroke, TIAs, expressive or receptive aphasia, or amaurosis fugax.  She is on aspirin and is on a statin.  She is continuing to smoke although she is trying to quit.  She did break her ankle recently but has made progress and is going to be able to put full weight on the ankle soon.  Past Medical History:  Diagnosis Date  . Ankle fracture    Left  . Anxiety   . Carotid artery occlusion   . Closed fracture of left distal fibula 09/16/2017  . Complication of anesthesia    ALLERY TO ESTER BASE  . Depression    early 32s  . Diabetes mellitus    INSULIN DEPENDENT  . GERD (gastroesophageal reflux disease)   . Headache    years ago  . Hypertension   . Pneumonia   . Stroke [facility]) March 2015    left MCA infarct, slight weakness on left side  . Thyroid disease     Family History  Problem Relation Age of Onset  . Diabetes Mother   . Heart disease Mother        Before age 51  . Cancer Father        Lung  . Hypertension Sister   . Diabetes Sister   . Diabetes Sister   . Diabetes Sister     SOCIAL HISTORY: Social History   Tobacco Use  . Smoking status: Former Smoker    Packs/day: 0.20    Years: 20.00    Pack years: 4.00    Types: Cigarettes    Last attempt to quit: 11/01/2013    Years since quitting: 4.3  . Smokeless tobacco: Never Used  . Tobacco comment: Reports done to 1-2 a day.   Substance Use Topics  . Alcohol use: Yes    Alcohol/week: 0.6 - 1.2 oz    Types: 1 - 2 Standard drinks or  equivalent per week    Allergies  Allergen Reactions  . Anesthetics, Ester Anaphylaxis    Current Outpatient Medications  Medication Sig Dispense Refill  . amitriptyline (ELAVIL) 50 MG tablet Take 1 tablet (50 mg total) by mouth at bedtime. 30 tablet 0  . aspirin 325 MG tablet Take 1 tablet (325 mg total) by mouth daily.    Marland Kitchen atorvastatin (LIPITOR) 40 MG tablet Take 1 tablet (40 mg total) by mouth daily. 90 tablet 1  . Azilsartan-Chlorthalidone 40-12.5 MG TABS Take 1 tablet by mouth daily. 35 tablet 0  . Cholecalciferol 2000 units TABS Take 1 tablet (2,000 Units total) by mouth daily. 90 tablet 3  . Cyanocobalamin (VITAMIN B-12 CR PO) Take 1 tablet by mouth daily.     Marland Kitchen DEXILANT 60 MG capsule TAKE 1 CAPSULE BY MOUTH ONCE DAILY 90 capsule 1  . Empagliflozin-metFORMIN HCl (SYNJARDY) 12-998 MG TABS Take 1 tablet by mouth 2 (two) times daily. 180 tablet 1  . escitalopram (LEXAPRO) 20 MG tablet Take 1 tablet (20 mg total) by mouth  daily. 90 tablet 0  . gabapentin (NEURONTIN) 100 MG capsule Take 1 capsule (100 mg total) by mouth 2 (two) times daily. 180 capsule 1  . glucose blood (ACCU-CHEK GUIDE) test strip Use to check blood sugars twice a day 100 each 3  . insulin glargine (LANTUS) 100 UNIT/ML injection Inject 0.6 mLs (60 Units total) into the skin at bedtime. 10 mL 11  . insulin lispro (HUMALOG) 100 UNIT/ML KiwkPen Inject into skin subcutaneously 8 units at breakfast, 4 units at lunch and 30 untis at supper. Pen needles 4x daily. 15 pen 11  . lamoTRIgine (LAMICTAL) 25 MG tablet Take one tab daily for 1 week and than 2 tab daily (Patient taking differently: Take 50 mg by mouth daily. ) 60 tablet 0  . levothyroxine (SYNTHROID) 175 MCG tablet Take 1 tablet (175 mcg total) by mouth daily before breakfast. 90 tablet 0  . nebivolol (BYSTOLIC) 5 MG tablet Take 1 tablet (5 mg total) by mouth daily. 63 tablet 0  . potassium chloride SA (K-DUR,KLOR-CON) 20 MEQ tablet Take 1 tablet (20 mEq total) by  mouth 2 (two) times daily. 180 tablet 1  . Semaglutide (OZEMPIC) 1 MG/DOSE SOPN Inject 1 Act into the skin once a week. 12 pen 0  . telmisartan (MICARDIS) 40 MG tablet TAKE 1 TABLET BY MOUTH ONCE DAILY 90 tablet 1   No current facility-administered medications for this visit.     REVIEW OF SYSTEMS:  [X]  denotes positive finding, [ ]  denotes negative finding Cardiac  Comments:  Chest pain or chest pressure:    Shortness of breath upon exertion:    Short of breath when lying flat:    Irregular heart rhythm:        Vascular    Pain in calf, thigh, or hip brought on by ambulation:    Pain in feet at night that wakes you up from your sleep:     Blood clot in your veins:    Leg swelling:         Pulmonary    Oxygen at home:    Productive cough:     Wheezing:         Neurologic    Sudden weakness in arms or legs:     Sudden numbness in arms or legs:     Sudden onset of difficulty speaking or slurred speech:    Temporary loss of vision in one eye:     Problems with dizziness:         Gastrointestinal    Blood in stool:     Vomited blood:         Genitourinary    Burning when urinating:     Blood in urine:        Psychiatric    Major depression:         Hematologic    Bleeding problems:    Problems with blood clotting too easily:        Skin    Rashes or ulcers:        Constitutional    Fever or chills:     PHYSICAL EXAM:   Vitals:   03/15/18 1306 03/15/18 1313  BP: 128/66 123/61  Pulse: 89 91  Resp: 16   Temp: 98.4 F (36.9 C)   TempSrc: Oral   Weight: 139 lb (63 kg)   Height: 5\' 3"  (1.6 m)     GENERAL: The patient is a well-nourished female, in no acute distress. The vital signs are documented  above. CARDIAC: There is a regular rate and rhythm.  VASCULAR: I do not detect carotid bruits. Both feet are warm and well-perfused. She has no significant lower extremity swelling. PULMONARY: There is good air exchange bilaterally without wheezing or  rales. ABDOMEN: Soft and non-tender with normal pitched bowel sounds.  MUSCULOSKELETAL: There are no major deformities or cyanosis. NEUROLOGIC: No focal weakness or paresthesias are detected. SKIN: There are no ulcers or rashes noted. PSYCHIATRIC: The patient has a normal affect.  DATA:    CAROTID DUPLEX: I have reviewed her carotid duplex scan that was done yesterday.  On the left side her left carotid endarterectomy site is widely patent.  There is a mild external carotid artery stenosis which is not clinically significant.    On the right side she has a less than 39% stenosis.  MEDICAL ISSUES:   CAROTID DISEASE: Patient is doing well status post left carotid endarterectomy.  Her left carotid endarterectomy site is widely patent.  She has no significant stenosis on the right.  She is on aspirin and is on a statin.  I have ordered a follow-up yearly carotid duplex scan in 1 year and I will have the nurse practitioner see her back at that time.  She knows to call sooner if she has problems.  We have again discussed the importance of tobacco cessation.  Deitra Mayo [facility] [phone]

## 2018-03-17 ENCOUNTER — Encounter: Payer: Self-pay | Admitting: Internal Medicine

## 2018-03-17 ENCOUNTER — Telehealth: Payer: Self-pay | Admitting: Internal Medicine

## 2018-03-17 MED ORDER — GLUCOSE BLOOD VI STRP: ORAL_STRIP | 3 refills | Status: DC

## 2018-03-17 NOTE — Telephone Encounter (Signed)
Erx resent to test 5 times a day.

## 2018-03-17 NOTE — Telephone Encounter (Signed)
Copied from [place] [phone]. Topic: General - Other >> Mar 17, 2018  3:40 PM Judyann Munson wrote: Reason for CRM:  Patient is calling in  regards to  glucose blood (ACCU-CHEK GUIDE) test strip she is stating her medication should be 5 times daily and not 2 times daily. She hasnt tested in two days. She is requesting a new prescription for this medication to be called into [place], [place] [phone] (Phone) [phone] (Fax). Please advise

## 2018-03-21 ENCOUNTER — Ambulatory Visit
Admission: RE | Admit: 2018-03-21 | Discharge: 2018-03-21 | Disposition: A | Discharge status: Home / Self Care | Payer: Medicare Other | Source: Ambulatory Visit | Attending: Internal Medicine | Admitting: Internal Medicine

## 2018-03-21 DIAGNOSIS — Z1231 Encounter for screening mammogram for malignant neoplasm of breast: Secondary | ICD-10-CM

## 2018-03-21 LAB — HM MAMMOGRAPHY

## 2018-04-14 ENCOUNTER — Encounter: Payer: Self-pay | Admitting: Internal Medicine

## 2018-04-26 ENCOUNTER — Encounter

## 2018-04-26 ENCOUNTER — Ambulatory Visit: Payer: Self-pay | Admitting: Neurology

## 2018-05-10 ENCOUNTER — Telehealth: Payer: Self-pay | Admitting: Internal Medicine

## 2018-05-10 ENCOUNTER — Telehealth (HOSPITAL_COMMUNITY): Payer: Self-pay | Admitting: *Deleted

## 2018-05-10 DIAGNOSIS — E113519 Type 2 diabetes mellitus with proliferative diabetic retinopathy with macular edema, unspecified eye: Secondary | ICD-10-CM

## 2018-05-10 DIAGNOSIS — K2100 Gastro-esophageal reflux disease with esophagitis, without bleeding: Secondary | ICD-10-CM

## 2018-05-10 DIAGNOSIS — E039 Hypothyroidism, unspecified: Secondary | ICD-10-CM

## 2018-05-10 DIAGNOSIS — E785 Hyperlipidemia, unspecified: Secondary | ICD-10-CM

## 2018-05-10 DIAGNOSIS — I63512 Cerebral infarction due to unspecified occlusion or stenosis of left middle cerebral artery: Secondary | ICD-10-CM

## 2018-05-10 DIAGNOSIS — E876 Hypokalemia: Secondary | ICD-10-CM

## 2018-05-10 DIAGNOSIS — E118 Type 2 diabetes mellitus with unspecified complications: Secondary | ICD-10-CM

## 2018-05-10 DIAGNOSIS — I10 Essential (primary) hypertension: Secondary | ICD-10-CM

## 2018-05-10 DIAGNOSIS — Z794 Long term (current) use of insulin: Secondary | ICD-10-CM

## 2018-05-10 NOTE — Telephone Encounter (Signed)
Copied from [place] [id number]. Topic: General - Other >> May 10, 2018  9:36 AM Adelene Idler wrote: Clifton James from [place] is calling in wanting to know if fax was received for refill request for all medications prescribed by dr Ronnald Ramp.  Cb# [id number]

## 2018-05-10 NOTE — Telephone Encounter (Signed)
Pt was to follow up in August with PCP.

## 2018-05-10 NOTE — Telephone Encounter (Signed)
Call from [facility] with [facility] [place] stating patient needed refills of medications. Chart reviewed and medication in question (Amitriptyline, Lamotrigine, Lexapro) had not been refilled in several months. June was when patient had last visit. No future visits scheduled. Called back to [facility] and explained that patient will need to schedule a visit or she may be getting refills with a different provider? They will call to follow up with patient. No refills given at this time.

## 2018-05-12 NOTE — Telephone Encounter (Signed)
Left patient vm to call back to schedule appt  °

## 2018-05-22 ENCOUNTER — Emergency Department (HOSPITAL_COMMUNITY): Payer: Medicare Other

## 2018-05-22 ENCOUNTER — Other Ambulatory Visit: Payer: Self-pay

## 2018-05-22 ENCOUNTER — Emergency Department (HOSPITAL_COMMUNITY)
Admission: EM | Admit: 2018-05-22 | Discharge: 2018-05-22 | Disposition: A | Discharge status: Home / Self Care | Payer: Medicare Other | Attending: Emergency Medicine | Admitting: Emergency Medicine

## 2018-05-22 ENCOUNTER — Encounter (HOSPITAL_COMMUNITY): Payer: Self-pay

## 2018-05-22 DIAGNOSIS — Z87891 Personal history of nicotine dependence: Secondary | ICD-10-CM | POA: Diagnosis not present

## 2018-05-22 DIAGNOSIS — H9192 Unspecified hearing loss, left ear: Secondary | ICD-10-CM | POA: Diagnosis not present

## 2018-05-22 DIAGNOSIS — R51 Headache: Secondary | ICD-10-CM | POA: Diagnosis not present

## 2018-05-22 DIAGNOSIS — I10 Essential (primary) hypertension: Secondary | ICD-10-CM | POA: Insufficient documentation

## 2018-05-22 DIAGNOSIS — Z79899 Other long term (current) drug therapy: Secondary | ICD-10-CM | POA: Insufficient documentation

## 2018-05-22 DIAGNOSIS — E039 Hypothyroidism, unspecified: Secondary | ICD-10-CM | POA: Insufficient documentation

## 2018-05-22 DIAGNOSIS — Z794 Long term (current) use of insulin: Secondary | ICD-10-CM | POA: Insufficient documentation

## 2018-05-22 DIAGNOSIS — Z7982 Long term (current) use of aspirin: Secondary | ICD-10-CM | POA: Diagnosis not present

## 2018-05-22 DIAGNOSIS — E119 Type 2 diabetes mellitus without complications: Secondary | ICD-10-CM | POA: Diagnosis not present

## 2018-05-22 DIAGNOSIS — I639 Cerebral infarction, unspecified: Secondary | ICD-10-CM | POA: Diagnosis not present

## 2018-05-22 DIAGNOSIS — H9202 Otalgia, left ear: Secondary | ICD-10-CM | POA: Diagnosis not present
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
## 2018-05-22 NOTE — ED Notes (Signed)
Pt ambulated with steady gait to the bathroom.

## 2018-05-25 ENCOUNTER — Encounter: Payer: Self-pay | Admitting: Family Medicine

## 2018-05-25 ENCOUNTER — Ambulatory Visit (INDEPENDENT_AMBULATORY_CARE_PROVIDER_SITE_OTHER): Payer: Medicare Other | Admitting: Family Medicine

## 2018-05-25 VITALS — BP 158/88 | HR 97 | Temp 98.6°F | Ht 62.0 in | Wt 123.8 lb

## 2018-05-25 DIAGNOSIS — G44309 Post-traumatic headache, unspecified, not intractable: Secondary | ICD-10-CM | POA: Diagnosis not present

## 2018-05-25 DIAGNOSIS — Z23 Encounter for immunization: Secondary | ICD-10-CM

## 2018-05-25 DIAGNOSIS — H9312 Tinnitus, left ear: Secondary | ICD-10-CM

## 2018-05-25 NOTE — Patient Instructions (Addendum)
Please take acetaminophen (Tylenol) for your headache. Follow the instructions for extra strength Tylenol on the bottle.   You can add aspirin short term. You can add one additional aspirin to your regimen and follow directions on bottle for pain management.   Symptoms are similar to tension headache and information is listed below.  If symptoms do not improve with treatment, worsen, or new symptoms develop, please seek evaluation.    Tension Headache A tension headache is pain, pressure, or aching that is felt over the front and sides of your head. These headaches can last from 30 minutes to several days. Follow these instructions at home: Managing pain  Take over-the-counter and prescription medicines only as told by your doctor.  Lie down in a dark, quiet room when you have a headache.  If directed, apply ice to your head and neck area: ? Put ice in a plastic bag. ? Place a towel between your skin and the bag. ? Leave the ice on for 20 minutes, 2-3 times per day.  Use a heating pad or a hot shower to apply heat to your head and neck area as told by your doctor. Eating and drinking  Eat meals on a regular schedule.  Do not drink a lot of alcohol.  Do not use a lot of caffeine, or stop using caffeine. General instructions  Keep all follow-up visits as told by your doctor. This is important.  Keep a journal to find out if certain things bring on headaches. For example, write down: ? What you eat and drink. ? How much sleep you get. ? Any change to your diet or medicines.  Try getting a massage, or doing other things that help you to relax.  Lessen stress.  Sit up straight. Do not tighten (tense) your muscles.  Do not use tobacco products. This includes cigarettes, chewing tobacco, or e-cigarettes. If you need help quitting, ask your doctor.  Exercise regularly as told by your doctor.  Get enough sleep. This may mean 7-9 hours of sleep. Contact a doctor if:  Your  symptoms are not helped by medicine.  You have a headache that feels different from your usual headache.  You feel sick to your stomach (nauseous) or you throw up (vomit).  You have a fever. Get help right away if:  Your headache becomes very bad.  You keep throwing up.  You have a stiff neck.  You have trouble seeing.  You have trouble speaking.  You have pain in your eye or ear.  Your muscles are weak or you lose muscle control.  You lose your balance or you have trouble walking.  You feel like you will pass out (faint) or you pass out.  You have confusion. This information is not intended to replace advice given to you by your health care provider. Make sure you discuss any questions you have with your health care provider. Document Released: 10/27/2009 Document Revised: 04/01/2016 Document Reviewed: 11/25/2014 Elsevier Interactive Patient Education  [employer].
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
## 2018-05-30 ENCOUNTER — Encounter: Payer: Self-pay | Admitting: Endocrinology

## 2018-06-08 ENCOUNTER — Other Ambulatory Visit: Payer: Self-pay

## 2018-06-08 NOTE — Patient Outreach (Signed)
[facility]) Care Management  06/08/2018  ALLIAH BOULANGER 02-14-1951 [id number]   Medication Adherence call to Mrs : Salene Mohamud left a message for patient to call back patient is due on Telmisartan 40 mg Mrs. Erdman is showing past due under [facility].  [facility] Direct Dial [phone]  Fax [phone] [email or web address]

## 2018-06-15 ENCOUNTER — Encounter (HOSPITAL_COMMUNITY): Payer: Self-pay | Admitting: Psychiatry

## 2018-06-15 ENCOUNTER — Ambulatory Visit (INDEPENDENT_AMBULATORY_CARE_PROVIDER_SITE_OTHER): Payer: Medicare Other | Admitting: Psychiatry

## 2018-06-15 VITALS — BP 118/70 | Ht 62.0 in | Wt 131.0 lb

## 2018-06-15 DIAGNOSIS — F411 Generalized anxiety disorder: Secondary | ICD-10-CM

## 2018-06-15 DIAGNOSIS — F331 Major depressive disorder, recurrent, moderate: Secondary | ICD-10-CM

## 2018-06-15 MED ORDER — ESCITALOPRAM OXALATE 20 MG PO TABS: 20.0000 mg | ORAL_TABLET | Freq: Every day | ORAL | 0 refills | Status: DC

## 2018-06-15 MED ORDER — LAMOTRIGINE 25 MG PO TABS: ORAL_TABLET | ORAL | 2 refills | Status: DC
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
## 2018-06-21 ENCOUNTER — Ambulatory Visit (INDEPENDENT_AMBULATORY_CARE_PROVIDER_SITE_OTHER): Payer: Medicare Other | Admitting: Internal Medicine

## 2018-06-21 ENCOUNTER — Other Ambulatory Visit (INDEPENDENT_AMBULATORY_CARE_PROVIDER_SITE_OTHER): Payer: Medicare Other

## 2018-06-21 ENCOUNTER — Encounter: Payer: Self-pay | Admitting: Internal Medicine

## 2018-06-21 ENCOUNTER — Ambulatory Visit (INDEPENDENT_AMBULATORY_CARE_PROVIDER_SITE_OTHER)
Admission: RE | Admit: 2018-06-21 | Discharge: 2018-06-21 | Disposition: A | Discharge status: Home / Self Care | Payer: Medicare Other | Source: Ambulatory Visit | Attending: Internal Medicine | Admitting: Internal Medicine

## 2018-06-21 VITALS — BP 160/66 | HR 77 | Temp 98.6°F | Resp 16 | Ht 62.0 in | Wt 127.5 lb

## 2018-06-21 DIAGNOSIS — I10 Essential (primary) hypertension: Secondary | ICD-10-CM | POA: Diagnosis not present

## 2018-06-21 DIAGNOSIS — R1033 Periumbilical pain: Secondary | ICD-10-CM | POA: Diagnosis not present

## 2018-06-21 DIAGNOSIS — E118 Type 2 diabetes mellitus with unspecified complications: Secondary | ICD-10-CM

## 2018-06-21 DIAGNOSIS — R10815 Periumbilic abdominal tenderness: Secondary | ICD-10-CM | POA: Diagnosis not present

## 2018-06-21 DIAGNOSIS — E876 Hypokalemia: Secondary | ICD-10-CM

## 2018-06-21 DIAGNOSIS — E039 Hypothyroidism, unspecified: Secondary | ICD-10-CM | POA: Diagnosis not present

## 2018-06-21 DIAGNOSIS — K5732 Diverticulitis of large intestine without perforation or abscess without bleeding: Secondary | ICD-10-CM

## 2018-06-21 LAB — CBC WITH DIFFERENTIAL/PLATELET
Basophils Absolute: 0.1 10*3/uL (ref 0.0–0.1)
Basophils Relative: 1.1 % (ref 0.0–3.0)
Eosinophils Absolute: 0.4 10*3/uL (ref 0.0–0.7)
Eosinophils Relative: 3.1 % (ref 0.0–5.0)
HCT: 38.2 % (ref 36.0–46.0)
Hemoglobin: 12.2 g/dL (ref 12.0–15.0)
Lymphocytes Relative: 35.3 % (ref 12.0–46.0)
Lymphs Abs: 4.2 10*3/uL — ABNORMAL HIGH (ref 0.7–4.0)
MCHC: 31.8 g/dL (ref 30.0–36.0)
MCV: 87.1 fl (ref 78.0–100.0)
Monocytes Absolute: 0.7 10*3/uL (ref 0.1–1.0)
Monocytes Relative: 6.3 % (ref 3.0–12.0)
Neutro Abs: 6.5 10*3/uL (ref 1.4–7.7)
Neutrophils Relative %: 54.2 % (ref 43.0–77.0)
Platelets: 250 10*3/uL (ref 150.0–400.0)
RBC: 4.39 Mil/uL (ref 3.87–5.11)
RDW: 14.5 % (ref 11.5–15.5)
WBC: 11.9 10*3/uL — ABNORMAL HIGH (ref 4.0–10.5)

## 2018-06-21 LAB — COMPREHENSIVE METABOLIC PANEL
ALT: 7 U/L (ref 0–35)
AST: 9 U/L (ref 0–37)
Albumin: 4.2 g/dL (ref 3.5–5.2)
Alkaline Phosphatase: 94 U/L (ref 39–117)
BUN: 9 mg/dL (ref 6–23)
CO2: 28 mEq/L (ref 19–32)
Calcium: 9.3 mg/dL (ref 8.4–10.5)
Chloride: 104 mEq/L (ref 96–112)
Creatinine, Ser: 1.06 mg/dL (ref 0.40–1.20)
GFR: 66.12 mL/min (ref >60.00)
Glucose, Bld: 196 mg/dL — ABNORMAL HIGH (ref 70–99)
Potassium: 3.2 mEq/L — ABNORMAL LOW (ref 3.5–5.1)
Sodium: 141 mEq/L (ref 135–145)
Total Bilirubin: 0.3 mg/dL (ref 0.2–1.2)
Total Protein: 6.9 g/dL (ref 6.0–8.3)

## 2018-06-21 LAB — URINALYSIS, ROUTINE W REFLEX MICROSCOPIC
Bilirubin Urine: NEGATIVE
Hgb urine dipstick: NEGATIVE
Ketones, ur: NEGATIVE
Leukocytes, UA: NEGATIVE
Nitrite: NEGATIVE
Specific Gravity, Urine: <=1.005 — AB (ref 1.000–1.030)
Total Protein, Urine: NEGATIVE
Urine Glucose: >=1000 — AB
Urobilinogen, UA: 0.2 (ref 0.0–1.0)
pH: 6 (ref 5.0–8.0)

## 2018-06-21 LAB — LIPASE: Lipase: 29 U/L (ref 11.0–59.0)

## 2018-06-21 LAB — TSH: TSH: 11.25 u[IU]/mL — ABNORMAL HIGH (ref 0.35–4.50)

## 2018-06-21 LAB — C-REACTIVE PROTEIN: CRP: 1.9 mg/dL (ref 0.5–20.0)

## 2018-06-21 LAB — HEMOGLOBIN A1C: Hgb A1c MFr Bld: 8.8 % — ABNORMAL HIGH (ref 4.6–6.5)

## 2018-06-21 MED ORDER — SULFAMETHOXAZOLE-TRIMETHOPRIM 800-160 MG PO TABS: 1.0000 | ORAL_TABLET | Freq: Two times a day (BID) | ORAL | 0 refills | Status: AC
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
## 2018-06-21 NOTE — Patient Instructions (Signed)

## 2018-06-23 ENCOUNTER — Other Ambulatory Visit: Payer: Self-pay

## 2018-06-23 NOTE — Patient Outreach (Signed)
[facility]) Care Management  06/23/2018  [name] 06/26/1951 [id number]   Medication Adherence call to [name] left a message for patient to call back patient is due on Telmisartan 40 mg. [name] is showing past due under [place].   [facility] Management Direct Dial [phone]  Fax [phone] [email or web address]

## 2018-06-26 DIAGNOSIS — E119 Type 2 diabetes mellitus without complications: Secondary | ICD-10-CM | POA: Diagnosis not present

## 2018-06-26 DIAGNOSIS — H25813 Combined forms of age-related cataract, bilateral: Secondary | ICD-10-CM | POA: Diagnosis not present

## 2018-06-30 ENCOUNTER — Telehealth: Payer: Self-pay | Admitting: Internal Medicine

## 2018-06-30 NOTE — Telephone Encounter (Signed)
Copied from [place] [phone]. Topic: Quick Communication - Rx Refill/Question >> Jun 30, 2018 11:36 AM Blase Mess A wrote: Medication: Cyanocobalamin (VITAMIN B-12 CR PO) [[id number]]   Has the patient contacted their pharmacy? Yes  (Agent: If no, request that the patient contact the pharmacy for the refill.) (Agent: If yes, when and what did the pharmacy advise?)  Preferred Pharmacy (with phone number or street name): [facility], [address] [place] [address] Phone: [phone] Fax: [phone]    Agent: Please be advised that RX refills may take up to 3 business days. We ask that you follow-up with your pharmacy.

## 2018-06-30 NOTE — Telephone Encounter (Signed)
We need to know what the dose of the B12 is and how often she is taking it.

## 2018-07-03 ENCOUNTER — Other Ambulatory Visit: Payer: Self-pay | Admitting: Internal Medicine

## 2018-07-03 DIAGNOSIS — G43011 Migraine without aura, intractable, with status migrainosus: Secondary | ICD-10-CM

## 2018-07-03 DIAGNOSIS — E039 Hypothyroidism, unspecified: Secondary | ICD-10-CM

## 2018-07-03 DIAGNOSIS — E785 Hyperlipidemia, unspecified: Secondary | ICD-10-CM

## 2018-07-03 DIAGNOSIS — E876 Hypokalemia: Secondary | ICD-10-CM

## 2018-07-03 DIAGNOSIS — E113519 Type 2 diabetes mellitus with proliferative diabetic retinopathy with macular edema, unspecified eye: Secondary | ICD-10-CM

## 2018-07-03 DIAGNOSIS — I10 Essential (primary) hypertension: Secondary | ICD-10-CM

## 2018-07-03 DIAGNOSIS — E118 Type 2 diabetes mellitus with unspecified complications: Secondary | ICD-10-CM

## 2018-07-03 DIAGNOSIS — I63512 Cerebral infarction due to unspecified occlusion or stenosis of left middle cerebral artery: Secondary | ICD-10-CM

## 2018-07-03 DIAGNOSIS — Z794 Long term (current) use of insulin: Secondary | ICD-10-CM

## 2018-07-03 MED ORDER — EMPAGLIFLOZIN-METFORMIN HCL 5-1000 MG PO TABS: 1.0000 | ORAL_TABLET | Freq: Two times a day (BID) | ORAL | 1 refills | Status: DC

## 2018-07-03 MED ORDER — POTASSIUM CHLORIDE CRYS ER 20 MEQ PO TBCR: 20.0000 meq | EXTENDED_RELEASE_TABLET | Freq: Two times a day (BID) | ORAL | 1 refills | Status: DC

## 2018-07-03 MED ORDER — LEVOTHYROXINE SODIUM 175 MCG PO TABS: 175.0000 ug | ORAL_TABLET | Freq: Every day | ORAL | 1 refills | Status: DC

## 2018-07-03 MED ORDER — AZILSARTAN-CHLORTHALIDONE 40-12.5 MG PO TABS: 1.0000 | ORAL_TABLET | Freq: Every day | ORAL | 0 refills | Status: DC

## 2018-07-03 MED ORDER — ATORVASTATIN CALCIUM 40 MG PO TABS: 40.0000 mg | ORAL_TABLET | Freq: Every day | ORAL | 1 refills | Status: DC

## 2018-07-03 MED ORDER — GLUCOSE BLOOD VI STRP: ORAL_STRIP | 3 refills | Status: DC

## 2018-07-03 MED ORDER — NEBIVOLOL HCL 5 MG PO TABS: 5.0000 mg | ORAL_TABLET | Freq: Every day | ORAL | 1 refills | Status: DC

## 2018-07-03 MED ORDER — GABAPENTIN 100 MG PO CAPS: 100.0000 mg | ORAL_CAPSULE | Freq: Two times a day (BID) | ORAL | 1 refills | Status: DC

## 2018-07-03 NOTE — Telephone Encounter (Signed)
I don't think she has B12 defic

## 2018-07-07 ENCOUNTER — Other Ambulatory Visit: Payer: Self-pay | Admitting: Internal Medicine

## 2018-07-07 DIAGNOSIS — Z794 Long term (current) use of insulin: Principal | ICD-10-CM

## 2018-07-07 DIAGNOSIS — E118 Type 2 diabetes mellitus with unspecified complications: Secondary | ICD-10-CM

## 2018-07-10 ENCOUNTER — Ambulatory Visit: Payer: Self-pay | Admitting: Registered"

## 2018-07-19 ENCOUNTER — Encounter: Payer: Self-pay | Admitting: Internal Medicine

## 2018-07-24 ENCOUNTER — Telehealth (HOSPITAL_COMMUNITY): Payer: Self-pay

## 2018-07-24 NOTE — Telephone Encounter (Signed)
Patient is calling because she is having increased agitation and she has been very impatient. She does not follow up until next month and would like to know if you can do anything in the meantime. Please review and advise, thank you

## 2018-07-24 NOTE — Telephone Encounter (Signed)
She is a history of noncompliance with medication.  She does not want to take amitriptyline and Lexapro.  Is she taking Lamictal?  If yes then how much she is taking it.  We may need to increase the dose.

## 2018-07-26 ENCOUNTER — Encounter: Payer: Medicare Other | Attending: Internal Medicine | Admitting: *Deleted

## 2018-07-26 ENCOUNTER — Telehealth: Payer: Self-pay | Admitting: Internal Medicine

## 2018-07-26 DIAGNOSIS — E118 Type 2 diabetes mellitus with unspecified complications: Secondary | ICD-10-CM

## 2018-07-26 DIAGNOSIS — E119 Type 2 diabetes mellitus without complications: Secondary | ICD-10-CM | POA: Insufficient documentation

## 2018-07-26 NOTE — Patient Instructions (Signed)
Plan:  Aim for 3 Carb Choices per meal (45 grams) +/- 1 either way  Aim for 0-2 Carbs per snack if hungry  Include protein in moderation with your meals and snacks Consider checking BG at alternate times per day  Continue taking medication as directed by MD

## 2018-07-26 NOTE — Telephone Encounter (Signed)
Copied from [place] [id number]. Topic: Quick Communication - See Telephone Encounter >> Jul 26, 2018  4:11 PM Percell Belt A wrote: CRM for notification. See Telephone encounter for: 07/26/18. Dibetes [place] called in and is seeing pt and pt is confused on how she is suppose to be taking insulin, please advise  20 unit of lantus and 2 unit of Humalog  Call pt to go over the meds

## 2018-07-27 NOTE — Progress Notes (Signed)
Diabetes Self-Management Education  Visit Type: First/Initial  Appt. Start Time: 1530 Appt. End Time: 1700  07/27/2018  [name], identified by name and date of birth, is a 67 y.o. female with a diagnosis of Diabetes: Type 2. 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 She would like to talk about her medications today, and get clarification on the insulin doses and timing of her medications. She states she has been taking Lantus @ 20 units in the morning with her breakfast meal and Humalog @ 5 units with most of her meals. I do not see any insulin on her Medication Sheet.she also expresses interest in understanding what foods she should eat or avoid.   ASSESSMENT  Height 5\' 2"  (1.575 m), weight 130 lb 6.4 oz (59.1 kg). Body mass index is 23.85 kg/m.  Diabetes Self-Management Education - 07/26/18 1544      Visit Information   Visit Type  First/Initial      Initial Visit   Diabetes Type  Type 2    Are you currently following a meal plan?  No    Are you taking your medications as prescribed?  No   She is not sure about when and how much Lantus and Humalog insulin to take?   Date Diagnosed  1965      Health Coping   How would you rate your overall health?  Fair      Psychosocial Assessment   Patient Belief/Attitude about Diabetes  Defeat/Burnout    Self-care barriers  None    Self-management support  None    Other persons present  Patient    Patient Concerns  Nutrition/Meal planning;Medication;Glycemic Control    Special Needs  None    Preferred Learning Style  No preference indicated    Learning Readiness  Change in progress    How often do you need to have someone help you when you read instructions, pamphlets, or other written materials from your doctor or pharmacy?  1 - Never    What is the last grade level you completed in school?  college      Pre-Education Assessment   Patient understands the diabetes disease and treatment  process.  Needs Review    Patient understands incorporating nutritional management into lifestyle.  Needs Instruction    Patient undertands incorporating physical activity into lifestyle.  Needs Instruction    Patient understands using medications safely.  Needs Instruction    Patient understands monitoring blood glucose, interpreting and using results  Needs Review    Patient understands prevention, detection, and treatment of acute complications.  Needs Review    Patient understands prevention, detection, and treatment of chronic complications.  Needs Review    Patient understands how to develop strategies to address psychosocial issues.  Needs Review    Patient understands how to develop strategies to promote health/change behavior.  Needs Review      Complications   Last HgB A1C per patient/outside source  8.8 %    How often do you check your blood sugar?  1-2 times/day    Fasting Blood glucose range (mg/dL)  130-179    Postprandial Blood glucose range (mg/dL)  130-179    Have you had a dilated eye exam in the past 12 months?  Yes    Have you had a dental exam in the past 12 months?  Yes    Are you checking your feet?  Yes    How many days per week  are you checking your feet?  1      Dietary Intake   Breakfast  [employer] sausage and egg biscuit OR boiled egg at home    Snack (morning)  not usually unless pretzels    Lunch  buys at school or brings from home: left overs with meat, starch, vegetables OR school lunch with entree, starch, and vegetable    Snack (afternoon)  pretzels occasionally    Dinner  sandwich often, soup or hot meal    Snack (evening)  sweets occasionally    Beverage(s)  coffee with 2 tsp raw sugar, regular soda      Exercise   Exercise Type  ADL's    How many days per week to you exercise?  0      Patient Education   Previous Diabetes Education  No    Disease state   Factors that contribute to the development of diabetes    Nutrition management   Role of  diet in the treatment of diabetes and the relationship between the three main macronutrients and blood glucose level;Food label reading, portion sizes and measuring food.;Carbohydrate counting;Reviewed blood glucose goals for pre and post meals and how to evaluate the patients' food intake on their blood glucose level.    Physical activity and exercise   Role of exercise on diabetes management, blood pressure control and cardiac health.    Medications  Other (comment)   Called PCP office to get clarification of insulin orders, if any   Monitoring  Identified appropriate SMBG and/or A1C goals.    Acute complications  Taught treatment of hypoglycemia - the 15 rule.    Psychosocial adjustment  Worked with patient to identify barriers to care and solutions      Individualized Goals (developed by patient)   Physical Activity  15 minutes per day    Medications  take my medication as prescribed    Monitoring   test blood glucose pre and post meals as discussed      Post-Education Assessment   Patient understands the diabetes disease and treatment process.  Demonstrates understanding / competency    Patient understands incorporating nutritional management into lifestyle.  Demonstrates understanding / competency    Patient undertands incorporating physical activity into lifestyle.  Demonstrates understanding / competency    Patient understands using medications safely.  Demonstrates understanding / competency    Patient understands monitoring blood glucose, interpreting and using results  Demonstrates understanding / competency    Patient understands prevention, detection, and treatment of acute complications.  Demonstrates understanding / competency    Patient understands prevention, detection, and treatment of chronic complications.  Demonstrates understanding / competency    Patient understands how to develop strategies to address psychosocial issues.  Demonstrates understanding / competency     Patient understands how to develop strategies to promote health/change behavior.  Demonstrates understanding / competency      Outcomes   Expected Outcomes  Demonstrated interest in learning. Expect positive outcomes    Future DMSE  PRN    Program Status  Completed       Individualized Plan for Diabetes Self-Management Training:   Learning Objective:  Patient will have a greater understanding of diabetes self-management. Patient education plan is to attend individual and/or group sessions per assessed needs and concerns.  I called Dr. Ronnald Ramp' office and spoke with [facility] regarding the question about whether she should be taking her insulin or not. I confirmed that the patient does state she is taking the  Ozempic once a week in addition to her Lantus @ 20 units and Humalog @ 5 units per meal. They offered to call the patient back directly once they can investigate and get the answer.    Plan:   Patient Instructions  Plan:  Aim for 3 Carb Choices per meal (45 grams) +/- 1 either way  Aim for 0-2 Carbs per snack if hungry  Include protein in moderation with your meals and snacks Consider checking BG at alternate times per day  Continue taking medication as directed by MD  Expected Outcomes:  Demonstrated interest in learning. Expect positive outcomes  Education material provided: Food label handouts, A1C conversion sheet, Meal plan card and Carbohydrate counting sheet  If problems or questions, patient to contact team via:  Phone  Future DSME appointment: PRN

## 2018-07-27 NOTE — Telephone Encounter (Signed)
LVM for pt to call back as soon as possible.   

## 2018-08-01 ENCOUNTER — Other Ambulatory Visit: Payer: Self-pay

## 2018-08-01 NOTE — Patient Outreach (Signed)
[facility]) Care Management  08/01/2018  Sonia Skinner May 13, 1951 [id number]   Medication Adherence call to [name] left a message for patient to call back patient is due on Telmisartan 40 mg.  [name] is showing past due under [place].   [facility] Management Direct Dial [phone]  Fax [phone] [email or web address]

## 2018-08-01 NOTE — Telephone Encounter (Signed)
I have tried several times to reach patient - left 2 voice mails. I will close this encounter until I hear back from the patient.

## 2018-08-01 NOTE — Telephone Encounter (Signed)
LVM for pt to call back as soon as possible.   

## 2018-08-14 ENCOUNTER — Other Ambulatory Visit: Payer: Self-pay

## 2018-08-14 NOTE — Patient Outreach (Signed)
[facility]) Care Management  08/14/2018  TAUNIA FRASCO [birth year] [id number]   Medication Adherence call to Mrs. Aarica Wax left a message for patient to call back patient is due on Telmisartan 40 mg.  Mrs. Smoots is showing past due under [place].   [facility] Management Direct Dial [phone]  Fax [phone] [email or web address]

## 2018-08-19 ENCOUNTER — Encounter: Payer: Self-pay | Admitting: Internal Medicine

## 2018-08-30 ENCOUNTER — Telehealth: Payer: Self-pay | Admitting: Internal Medicine

## 2018-08-30 DIAGNOSIS — Z794 Long term (current) use of insulin: Principal | ICD-10-CM

## 2018-08-30 DIAGNOSIS — E118 Type 2 diabetes mellitus with unspecified complications: Secondary | ICD-10-CM

## 2018-08-30 NOTE — Telephone Encounter (Signed)
Copied from [place] [phone] [phone]. Topic: Quick Communication - Rx Refill/Question >> Aug 30, 2018  8:39 AM Sheran Luz wrote: Medication: insulin glargine (LANTUS) 100 UNIT/ML injection, Semaglutide, 1 MG/DOSE, (OZEMPIC, 1 MG/DOSE,) 2 MG/1.5ML SOPN and DEXILANT 60 MG capsule   Patient is requesting refill of this medication.   Preferred Pharmacy (with phone number or street name):[facility] [place], [place]  [phone] (Phone) [phone] (Fax)

## 2018-08-30 NOTE — Telephone Encounter (Signed)
Medication: insulin glargine (LANTUS) 100 UNIT/ML injection, Semaglutide, 1 MG/DOSE, (OZEMPIC, 1 MG/DOSE,) 2 MG/1.5ML SOPN and DEXILANT 60 MG capsule   Patient is requesting refill of this medication.   Preferred Pharmacy (with phone number or street name):[facility] [address] [place], [place]          [phone] (Phone) [phone] (Fax)  Lantus and Dexilant are not on pt's current med profile. Please advise

## 2018-09-01 MED ORDER — SEMAGLUTIDE (1 MG/DOSE) 2 MG/1.5ML ~~LOC~~ SOPN: 1.0000 mg | PEN_INJECTOR | SUBCUTANEOUS | 1 refills | Status: DC

## 2018-09-01 NOTE — Telephone Encounter (Signed)
Spoke to pt to patient she stated that she is still taking the Lantus and Humalog.  Pt stated that she has been taking Lantus 20 units daily and the Humalog sliding scale daily and she only takes if her BS is 100-125 (5-10 daily). Pt has been taking the Synjardy and the Ozempic as rx'ed. Pt states that he BS has been from 150-200 on average. Pt has an appt on 09/18/2018 to review medications.   Pt is on her last Lantus pen now and wants to continue using the Lantus.  Erx for the Ozempic has been sent to new pharmacy [facility].

## 2018-09-04 ENCOUNTER — Other Ambulatory Visit (HOSPITAL_COMMUNITY): Payer: Self-pay | Admitting: Psychiatry

## 2018-09-04 DIAGNOSIS — F331 Major depressive disorder, recurrent, moderate: Secondary | ICD-10-CM

## 2018-09-11 ENCOUNTER — Ambulatory Visit (INDEPENDENT_AMBULATORY_CARE_PROVIDER_SITE_OTHER): Payer: Medicare Other | Admitting: Psychiatry

## 2018-09-11 ENCOUNTER — Encounter (HOSPITAL_COMMUNITY): Payer: Self-pay | Admitting: Psychiatry

## 2018-09-11 DIAGNOSIS — F331 Major depressive disorder, recurrent, moderate: Secondary | ICD-10-CM

## 2018-09-11 DIAGNOSIS — F411 Generalized anxiety disorder: Secondary | ICD-10-CM | POA: Diagnosis not present

## 2018-09-11 MED ORDER — LAMOTRIGINE 25 MG PO TABS: ORAL_TABLET | ORAL | 1 refills | Status: DC

## 2018-09-11 MED ORDER — ESCITALOPRAM OXALATE 20 MG PO TABS: 20.0000 mg | ORAL_TABLET | Freq: Every day | ORAL | 0 refills | Status: DC

## 2018-09-11 MED ORDER — AMITRIPTYLINE HCL 50 MG PO TABS: 50.0000 mg | ORAL_TABLET | Freq: Every day | ORAL | 1 refills | Status: DC

## 2018-09-11 NOTE — Progress Notes (Signed)
[place] MD/PA/NP OP Progress Note  09/11/2018 4:08 PM POPPI SCANTLING  MRN:  [id number]  Chief Complaint: I am very irritable.  I do not think medicine working.  HPI: Devonia came for her follow-up appointment.  She is upset because she felt medicine not working.  She had called few times and we have returned her phone call but she never checked her messages.  She became upset because believing the messages on her home phone which she actually her preferred method of communication.  She also not taking Lamictal as prescribed.  We have recommended to try Lamictal 25 mg for first week and then twice a day but her pharmacy is only giving 25 mg daily.  She also noncompliant with amitriptyline that is why we have discontinued but now she wants to go back on amitriptyline because she is not sleeping.  She agreed that lack of communication could be due to not providing the preferred number and also not getting amitriptyline because she was poorly compliant.  I explained that she need to take the Lamictal higher dose to help her mood and irritability and if it did not work then we are happy to change the medication.  Other than her irritability and depression she is sleeping better.  She is going to work as a Pharmacist, hospital.  She is happy that she has given EC classes which is not as stressful.  She has headaches and some nights she has difficulty sleeping.  Patient denies any paranoia, hallucination or any suicidal thoughts.  She feels easily tired and lack of energy.  She denies any aggressive behavior.  Visit Diagnosis:    ICD-10-CM   1. Moderate episode of recurrent major depressive disorder (HCC) F33.1 lamoTRIgine (LAMICTAL) 25 MG tablet    escitalopram (LEXAPRO) 20 MG tablet    amitriptyline (ELAVIL) 50 MG tablet  2. GAD (generalized anxiety disorder) F41.1 escitalopram (LEXAPRO) 20 MG tablet    amitriptyline (ELAVIL) 50 MG tablet    Past Psychiatric History: Reviewed. History of taking antidepressant on and off most  of her life.  History of inpatient at [facility] after overdose.  Seen physician at [facility] and given the Strattera, Adderall, Zoloft, Celexa. No history of psychosis, mania or any hallucination. She was never tested for ADD. In 2001 she moved to [facility] and saw psychiatrist there. She was given Wellbutrin to stop smoking. She was given amitriptyline which was discontinued due to poor compliance.    Past Medical History:  Past Medical History:  Diagnosis Date  . Ankle fracture    Left  . Anxiety   . Carotid artery occlusion   . Closed fracture of left distal fibula 09/16/2017  . Complication of anesthesia    ALLERY TO ESTER BASE  . Depression    early 13s  . Diabetes mellitus    INSULIN DEPENDENT  . GERD (gastroesophageal reflux disease)   . Headache    years ago  . Hypertension   . Pneumonia   . Stroke [facility]) March 2015    left MCA infarct, slight weakness on left side  . Thyroid disease     Past Surgical History:  Procedure Laterality Date  . ABDOMINAL HYSTERECTOMY    . ANTERIOR FIXATION AND POSTERIOR MICRODISCECTOMY CERVICAL SPINE  1999  . CAROTID ENDARTERECTOMY Left 11-04-13   cea  . ENDARTERECTOMY Left 11/04/2013  . ORIF ANKLE FRACTURE Left 09/16/2017   Procedure: OPEN REDUCTION INTERNAL FIXATION (ORIF) LEFT ANKLE FRACTURE;  Surgeon: Marchia Bond, MD;  Location:  [facility] OR;  Service: Orthopedics;  Laterality: Left;    Family Psychiatric History: Reviewed.  Family History:  Family History  Problem Relation Age of Onset  . Diabetes Mother   . Heart disease Mother        Before age 80  . Cancer Father        Lung  . Hypertension Sister   . Diabetes Sister   . Diabetes Sister   . Diabetes Sister     Social History:  Social History   Socioeconomic History  . Marital status: Legally Separated    Spouse name: Not on file  . Number of children: 0  . Years of education: College  . Highest education level: Not on file  Occupational History  .  Occupation: [employer]    Employer: [employer]  Social Needs  . Financial resource strain: Not on file  . Food insecurity:    Worry: Not on file    Inability: Not on file  . Transportation needs:    Medical: Not on file    Non-medical: Not on file  Tobacco Use  . Smoking status: Former Smoker    Packs/day: 0.20    Years: 20.00    Pack years: 4.00    Types: Cigarettes    Last attempt to quit: 11/01/2013    Years since quitting: 4.8  . Smokeless tobacco: Never Used  . Tobacco comment: Reports done to 1-2 a day.   Substance and Sexual Activity  . Alcohol use: Yes    Alcohol/week: 1.0 - 2.0 standard drinks    Types: 1 - 2 Standard drinks or equivalent per week  . Drug use: No  . Sexual activity: Not Currently  Lifestyle  . Physical activity:    Days per week: Not on file    Minutes per session: Not on file  . Stress: Not on file  Relationships  . Social connections:    Talks on phone: Not on file    Gets together: Not on file    Attends religious service: Not on file    Active member of club or organization: Not on file    Attends meetings of clubs or organizations: Not on file    Relationship status: Not on file  Other Topics Concern  . Not on file  Social History Narrative   Patient lives in 1 story home with sister.   Caffeine Use: 2 cups daily; sodas occasionally   Some college education   Works as Oceanographer for [employer]    Allergies:  Allergies  Allergen Reactions  . Anesthetics, Ester Anaphylaxis    Metabolic Disorder Labs: Lab Results  Component Value Date   HGBA1C 8.8 (H) 06/21/2018   MPG 326.4 09/16/2017   MPG 255 (H) 11/02/2013   No results found for: PROLACTIN Lab Results  Component Value Date   CHOL 198 02/20/2018   TRIG 170.0 (H) 02/20/2018   HDL 60.10 02/20/2018   CHOLHDL 3 02/20/2018   VLDL 34.0 02/20/2018   LDLCALC 104 (H) 02/20/2018   LDLCALC 83 11/24/2016   Lab Results  Component Value Date   TSH 11.25 (H) 06/21/2018   TSH  10.44 (H) 02/20/2018    Therapeutic Level Labs: No results found for: LITHIUM No results found for: VALPROATE No components found for:  CBMZ  Current Medications: Current Outpatient Medications  Medication Sig Dispense Refill  . aspirin 325 MG tablet Take 1 tablet (325 mg total) by mouth daily.    Marland Kitchen atorvastatin (LIPITOR) 40 MG  tablet Take 1 tablet (40 mg total) by mouth daily. 90 tablet 1  . Cyanocobalamin (VITAMIN B-12 CR PO) Take 1 tablet by mouth daily.     . Empagliflozin-metFORMIN HCl (SYNJARDY) 12-998 MG TABS Take 1 tablet by mouth 2 (two) times daily. 180 tablet 1  . escitalopram (LEXAPRO) 20 MG tablet Take 1 tablet (20 mg total) by mouth daily. 90 tablet 0  . gabapentin (NEURONTIN) 100 MG capsule Take 1 capsule (100 mg total) by mouth 2 (two) times daily. 180 capsule 1  . glucose blood (ACCU-CHEK GUIDE) test strip Use to check blood sugars five times a day 500 each 3  . lamoTRIgine (LAMICTAL) 25 MG tablet Take one tab daily for 1 week and than 2 tab daily 60 tablet 2  . levothyroxine (SYNTHROID) 175 MCG tablet Take 1 tablet (175 mcg total) by mouth daily before breakfast. 90 tablet 1  . nebivolol (BYSTOLIC) 5 MG tablet Take 1 tablet (5 mg total) by mouth daily. 90 tablet 1  . potassium chloride SA (K-DUR,KLOR-CON) 20 MEQ tablet Take 1 tablet (20 mEq total) by mouth 2 (two) times daily. 180 tablet 1  . Semaglutide, 1 MG/DOSE, (OZEMPIC, 1 MG/DOSE,) 2 MG/1.5ML SOPN Inject 1 mg into the skin every Tuesday. 12 pen 1  . Azilsartan-Chlorthalidone 40-12.5 MG TABS Take 1 tablet by mouth daily. 35 tablet 0   No current facility-administered medications for this visit.      Musculoskeletal: Strength & Muscle Tone: within normal limits Gait & Station: normal Patient leans: N/A  Psychiatric Specialty Exam: ROS  Blood pressure (!) 163/65, pulse 84, height 5\' 2"  (1.575 m), weight 131 lb (59.4 kg), SpO2 94 %.Body mass index is 23.96 kg/m.  General Appearance: Casual  Eye Contact:   Fair  Speech:  Slow  Volume:  Normal  Mood:  Depressed, Dysphoric and Irritable  Affect:  Congruent  Thought Process:  Descriptions of Associations: Intact  Orientation:  Full (Time, Place, and Person)  Thought Content: Rumination   Suicidal Thoughts:  No  Homicidal Thoughts:  No  Memory:  Immediate;   Fair Recent;   Fair Remote;   Fair  Judgement:  Fair  Insight:  Fair  Psychomotor Activity:  Increased  Concentration:  Concentration: Fair and Attention Span: Fair  Recall:  [employer] of Knowledge: Good  Language: Good  Akathisia:  No  Handed:  Right  AIMS (if indicated): not done  Assets:  Communication Skills Desire for Improvement Financial Resources/Insurance Housing Social Support  ADL's:  Intact  Cognition: WNL  Sleep:  Poor   Screenings: GAD-7     Office Visit from 08/14/2015 in [facility]  Total GAD-7 Score  7    Mini-Mental     Office Visit from 03/03/2018 in [facility] [place]  Total Score (max 30 points )  28    PHQ2-9     Nutrition from 07/26/2018 in [facility] Office Visit from 06/21/2018 in [facility] Visit from 02/20/2018 in [facility] Visit from 06/29/2017 in [facility] Office Visit from 11/20/2015 in [facility]  PHQ-2 Total Score  4  4  6   0  0  PHQ-9 Total Score  -  17  14  -  -       Assessment and Plan: Major depressive disorder, recurrent.  Generalized anxiety disorder.  Discussed again risk of noncompliance with medication  and to take the medication as prescribed.  I provide printout of the medication that she is supposed to take Lamictal twice a day but she is only taking once a day.  We discussed that she should try at least 100 mg of Lamictal and if do not see any improvement then we are happy to change the medication.  We will also change her  communication preference to her cellular phone.  We will also restart amitriptyline 50 mg at bedtime.  Reminded then she need to call us if there is any question or any concern and give [place] the right number so we can call her back.  I will continue Lexapro 20 mg daily.  She is not interested in therapy.  She has no rash, itching, tremors or shakes.  I will see her again in 6 weeks.   Kathlee Nations, MD 09/11/2018, 4:08 PM

## 2018-09-18 ENCOUNTER — Other Ambulatory Visit (INDEPENDENT_AMBULATORY_CARE_PROVIDER_SITE_OTHER): Payer: Medicare Other

## 2018-09-18 ENCOUNTER — Ambulatory Visit (INDEPENDENT_AMBULATORY_CARE_PROVIDER_SITE_OTHER): Payer: Medicare Other | Admitting: Internal Medicine

## 2018-09-18 ENCOUNTER — Encounter: Payer: Self-pay | Admitting: Internal Medicine

## 2018-09-18 VITALS — BP 172/70 | HR 88 | Temp 98.4°F | Resp 16 | Ht 62.0 in | Wt 129.0 lb

## 2018-09-18 DIAGNOSIS — E118 Type 2 diabetes mellitus with unspecified complications: Secondary | ICD-10-CM

## 2018-09-18 DIAGNOSIS — E876 Hypokalemia: Secondary | ICD-10-CM

## 2018-09-18 DIAGNOSIS — E039 Hypothyroidism, unspecified: Secondary | ICD-10-CM

## 2018-09-18 DIAGNOSIS — I10 Essential (primary) hypertension: Secondary | ICD-10-CM

## 2018-09-18 DIAGNOSIS — Z794 Long term (current) use of insulin: Secondary | ICD-10-CM | POA: Diagnosis not present

## 2018-09-18 DIAGNOSIS — R202 Paresthesia of skin: Secondary | ICD-10-CM

## 2018-09-18 DIAGNOSIS — R200 Anesthesia of skin: Secondary | ICD-10-CM

## 2018-09-18 LAB — POCT GLYCOSYLATED HEMOGLOBIN (HGB A1C): Hemoglobin A1C: 9.3 % — AB (ref 4.0–5.6)

## 2018-09-18 LAB — BASIC METABOLIC PANEL
BUN: 16 mg/dL (ref 6–23)
CO2: 27 mEq/L (ref 19–32)
Calcium: 10.4 mg/dL (ref 8.4–10.5)
Chloride: 103 mEq/L (ref 96–112)
Creatinine, Ser: 1.09 mg/dL (ref 0.40–1.20)
GFR: 60.19 mL/min (ref >60.00)
Glucose, Bld: 153 mg/dL — ABNORMAL HIGH (ref 70–99)
Potassium: 4.2 mEq/L (ref 3.5–5.1)
Sodium: 140 mEq/L (ref 135–145)

## 2018-09-18 LAB — TSH: TSH: 0.16 u[IU]/mL — ABNORMAL LOW (ref 0.35–4.50)

## 2018-09-18 MED ORDER — AZILSARTAN-CHLORTHALIDONE 40-12.5 MG PO TABS: 1.0000 | ORAL_TABLET | Freq: Every day | ORAL | 0 refills | Status: DC

## 2018-09-18 MED ORDER — BASAGLAR KWIKPEN 100 UNIT/ML ~~LOC~~ SOPN: 50.0000 [IU] | PEN_INJECTOR | Freq: Every day | SUBCUTANEOUS | 1 refills | Status: DC

## 2018-09-18 NOTE — Progress Notes (Signed)
Subjective:  Patient ID: Sonia Skinner, female    DOB: 10/07/1950  Age: 68 y.o. MRN: [id number]  CC: Hypertension and Diabetes   HPI Sonia Skinner presents for f/up - She complains of her blood pressure is not well controlled and she has had a few headaches recently.  When I last saw her about 3 months ago I had asked her to add an ARB and thiazide diuretic to her antihypertensive regimen.  She got 5 weeks of samples, did not return for a blood pressure recheck, did not ask for refills, and therefore has not been taking the ARB or thiazide diuretic.  Outpatient Medications Prior to Visit  Medication Sig Dispense Refill  . amitriptyline (ELAVIL) 50 MG tablet Take 1 tablet (50 mg total) by mouth at bedtime. 30 tablet 1  . aspirin 325 MG tablet Take 1 tablet (325 mg total) by mouth daily.    Marland Kitchen atorvastatin (LIPITOR) 40 MG tablet Take 1 tablet (40 mg total) by mouth daily. 90 tablet 1  . Cyanocobalamin (VITAMIN B-12 CR PO) Take 1 tablet by mouth daily.     . Empagliflozin-metFORMIN HCl (SYNJARDY) 12-998 MG TABS Take 1 tablet by mouth 2 (two) times daily. 180 tablet 1  . escitalopram (LEXAPRO) 20 MG tablet Take 1 tablet (20 mg total) by mouth daily. 90 tablet 0  . gabapentin (NEURONTIN) 100 MG capsule Take 1 capsule (100 mg total) by mouth 2 (two) times daily. 180 capsule 1  . glucose blood (ACCU-CHEK GUIDE) test strip Use to check blood sugars five times a day 500 each 3  . lamoTRIgine (LAMICTAL) 25 MG tablet Take two tab daily for 1 week and than 4 tab daily (Patient taking differently: Take 4 tablets by mouth daily.) 120 tablet 1  . nebivolol (BYSTOLIC) 5 MG tablet Take 1 tablet (5 mg total) by mouth daily. 90 tablet 1  . potassium chloride SA (K-DUR,KLOR-CON) 20 MEQ tablet Take 1 tablet (20 mEq total) by mouth 2 (two) times daily. 180 tablet 1  . Semaglutide, 1 MG/DOSE, (OZEMPIC, 1 MG/DOSE,) 2 MG/1.5ML SOPN Inject 1 mg into the skin every Tuesday. 12 pen 1  . Azilsartan-Chlorthalidone 40-12.5 MG  TABS Take 1 tablet by mouth daily. 35 tablet 0  . levothyroxine (SYNTHROID) 175 MCG tablet Take 1 tablet (175 mcg total) by mouth daily before breakfast. 90 tablet 1   No facility-administered medications prior to visit.     ROS Review of Systems  Constitutional: Negative.  Negative for appetite change, diaphoresis, fatigue, fever and unexpected weight change.  HENT: Negative.   Eyes: Negative for visual disturbance.  Respiratory: Negative for cough, chest tightness, shortness of breath and wheezing.   Cardiovascular: Negative for chest pain, palpitations and leg swelling.  Gastrointestinal: Negative for abdominal pain, diarrhea, nausea and vomiting.  Endocrine: Negative for cold intolerance, heat intolerance, polydipsia and polyphagia.  Genitourinary: Negative.  Negative for difficulty urinating, dysuria and enuresis.  Musculoskeletal: Negative.  Negative for arthralgias, back pain, myalgias and neck pain.  Skin: Negative for color change, pallor and rash.  Neurological: Positive for numbness and headaches. Negative for dizziness and speech difficulty.       She complains of numbness in both feet  Hematological: Negative for adenopathy. Does not bruise/bleed easily.  Psychiatric/Behavioral: Negative.     Objective:  BP (!) 172/70   Pulse 88   Temp 98.4 F (36.9 C) (Oral)   Resp 16   Ht 5\' 2"  (1.575 m)   Wt 129 lb (58.5  kg)   SpO2 96%   BMI 23.59 kg/m   BP Readings from Last 3 Encounters:  09/18/18 (!) 172/70  06/21/18 (!) 160/66  05/25/18 (!) 158/88    Wt Readings from Last 3 Encounters:  09/18/18 129 lb (58.5 kg)  07/26/18 130 lb 6.4 oz (59.1 kg)  06/21/18 127 lb 8 oz (57.8 kg)    Physical Exam Vitals signs reviewed.  Constitutional:      Appearance: She is not ill-appearing or diaphoretic.  HENT:     Nose: Nose normal. No congestion or rhinorrhea.     Mouth/Throat:     Mouth: Mucous membranes are moist.     Pharynx: Oropharynx is clear. No oropharyngeal  exudate or posterior oropharyngeal erythema.  Eyes:     General: No scleral icterus.    Conjunctiva/sclera: Conjunctivae normal.  Neck:     Musculoskeletal: Normal range of motion and neck supple. No neck rigidity or muscular tenderness.     Vascular: No carotid bruit.  Cardiovascular:     Rate and Rhythm: Normal rate and regular rhythm.     Pulses: Normal pulses.     Heart sounds: No murmur. No friction rub. No gallop.   Pulmonary:     Effort: Pulmonary effort is normal. No respiratory distress.     Breath sounds: No stridor. No wheezing, rhonchi or rales.  Abdominal:     General: Abdomen is flat. Bowel sounds are normal.     Palpations: There is no hepatomegaly, splenomegaly or mass.     Tenderness: There is no abdominal tenderness. There is no guarding.  Musculoskeletal: Normal range of motion.        General: No swelling or tenderness.  Lymphadenopathy:     Cervical: No cervical adenopathy.  Skin:    General: Skin is warm and dry.     Coloration: Skin is not pale.     Findings: No erythema or rash.  Neurological:     General: No focal deficit present.     Mental Status: She is oriented to person, place, and time.  Psychiatric:        Mood and Affect: Mood normal.        Behavior: Behavior normal.        Thought Content: Thought content normal.        Judgment: Judgment normal.     Lab Results  Component Value Date   WBC 11.9 (H) 06/21/2018   HGB 12.2 06/21/2018   HCT 38.2 06/21/2018   PLT 250.0 06/21/2018   GLUCOSE 153 (H) 09/18/2018   CHOL 198 02/20/2018   TRIG 170.0 (H) 02/20/2018   HDL 60.10 02/20/2018   LDLDIRECT 141 (H) 03/04/2010   LDLCALC 104 (H) 02/20/2018   ALT 7 06/21/2018   AST 9 06/21/2018   NA 140 09/18/2018   K 4.2 09/18/2018   CL 103 09/18/2018   CREATININE 1.09 09/18/2018   BUN 16 09/18/2018   CO2 27 09/18/2018   TSH 0.16 (L) 09/18/2018   INR 1.10 06/12/2015   HGBA1C 9.3 (A) 09/18/2018   MICROALBUR 0.7 02/20/2018    Dg Abd Acute  W/chest  Result Date: 06/21/2018 CLINICAL DATA:  Periumbilical pain EXAM: DG ABDOMEN ACUTE W/ 1V CHEST COMPARISON:  Chest 12/11/2016 FINDINGS: There is no evidence of dilated bowel loops or free intraperitoneal air. No radiopaque calculi or other significant radiographic abnormality is seen. Heart size and mediastinal contours are within normal limits. Both lungs are clear. IMPRESSION: Negative abdominal radiographs.  No acute cardiopulmonary disease.  Electronically Signed   By: Franchot Gallo M.D.   On: 06/21/2018 16:43    Assessment & Plan:   Jone was seen today for hypertension and diabetes.  Diagnoses and all orders for this visit:  Hypokalemia- Her potassium level is normal now. -     Basic metabolic panel; Future  Acquired hypothyroidism- Her TSH is suppressed so I have asked her to lower her dose of levothyroxine. -     TSH; Future -     levothyroxine (SYNTHROID, LEVOTHROID) 125 MCG tablet; Take 1 tablet (125 mcg total) by mouth daily.  Essential hypertension, benign- Her blood pressure is not adequately well controlled and she is symptomatic with headaches.  I have asked her to restart the ARB and thiazide diuretic. -     Basic metabolic panel; Future -     Azilsartan-Chlorthalidone 40-12.5 MG TABS; Take 1 tablet by mouth daily.  Type II diabetes mellitus with manifestations (HCC) -     Basic metabolic panel; Future -     Cancel: Hemoglobin A1c; Future -     Azilsartan-Chlorthalidone 40-12.5 MG TABS; Take 1 tablet by mouth daily. -     Insulin Glargine (BASAGLAR KWIKPEN) 100 UNIT/ML SOPN; Inject 0.5 mLs (50 Units total) into the skin daily.  Type 2 diabetes mellitus with complication, with long-term current use of insulin ([place])- Her A1c is up to 9.3%.  I have asked her to add a basal insulin to her current regimen.  I have also asked her to be seen by endocrinology and diabetic education. -     Azilsartan-Chlorthalidone 40-12.5 MG TABS; Take 1 tablet by mouth daily. -     POCT  HgB A1C -     Ambulatory referral to Endocrinology -     Amb Referral to Nutrition and Diabetic E -     Consult to [place] Management -     Insulin Glargine (BASAGLAR KWIKPEN) 100 UNIT/ML SOPN; Inject 0.5 mLs (50 Units total) into the skin daily.  Numbness and tingling of both feet-  She will undergo a NCS/EMG to identify the cause of her sx's -     Ambulatory referral to Neurology   I have discontinued Karen Chafe. Abplanalp's levothyroxine. I am also having her start on [facility] and levothyroxine. Additionally, I am having her maintain her aspirin, Cyanocobalamin (VITAMIN B-12 CR PO), potassium chloride SA, nebivolol, glucose blood, gabapentin, Empagliflozin-metFORMIN HCl, atorvastatin, Semaglutide (1 MG/DOSE), lamoTRIgine, escitalopram, amitriptyline, and Azilsartan-Chlorthalidone.  Meds ordered this encounter  Medications  . Azilsartan-Chlorthalidone 40-12.5 MG TABS    Sig: Take 1 tablet by mouth daily.    Dispense:  70 tablet    Refill:  0  . Insulin Glargine (BASAGLAR KWIKPEN) 100 UNIT/ML SOPN    Sig: Inject 0.5 mLs (50 Units total) into the skin daily.    Dispense:  9 mL    Refill:  1  . levothyroxine (SYNTHROID, LEVOTHROID) 125 MCG tablet    Sig: Take 1 tablet (125 mcg total) by mouth daily.    Dispense:  90 tablet    Refill:  0     Follow-up: Return in about 2 months (around 11/17/2018).  Scarlette Calico, MD

## 2018-09-18 NOTE — Patient Instructions (Signed)

## 2018-09-19 ENCOUNTER — Other Ambulatory Visit: Payer: Self-pay

## 2018-09-19 ENCOUNTER — Encounter: Payer: Self-pay | Admitting: Internal Medicine

## 2018-09-19 MED ORDER — LEVOTHYROXINE SODIUM 125 MCG PO TABS: 125.0000 ug | ORAL_TABLET | Freq: Every day | ORAL | 0 refills | Status: DC

## 2018-09-19 NOTE — Patient Outreach (Signed)
[facility]) Care Management  09/19/2018  Sonia Skinner [birth year] [id number]   TELEPHONE SCREENING Referral date: 09/18/18 Referral source: primary MD  Referral reason: poorly controlled diabetes Insurance: [place] health care Attempt #1  Telephone call to patient regarding primary MD referral. Unable to reach patient. HIPAA compliant voice message left with call back phone number.   PLAN: RNCM will attempt 2nd telephone call to patient within 4 business days. RNCM will send outreach letter.   Quinn Plowman RN,BSN, [place] Telephonic  [phone]

## 2018-09-21 ENCOUNTER — Other Ambulatory Visit: Payer: Self-pay

## 2018-09-21 NOTE — Patient Outreach (Signed)
[facility]) Care Management  09/21/2018  AYLENE ACOFF 04-Apr-1951 [id number]  TELEPHONE SCREENING Referral date: 09/18/18 Referral source: primary MD  Referral reason: poorly controlled diabetes Insurance: [place] health care  Telephone call to patient regarding primary MD referral. Unable to reach patient. HIPAA compliant voice message left with call back phone  PLAN:  RNCM will attempt 2nd telephone outreach within 4 business days.   Quinn Plowman RN,BSN,CCM [facility] Telephonic  [phone]

## 2018-09-26 ENCOUNTER — Other Ambulatory Visit: Payer: Self-pay

## 2018-09-26 DIAGNOSIS — E118 Type 2 diabetes mellitus with unspecified complications: Secondary | ICD-10-CM

## 2018-09-26 DIAGNOSIS — Z794 Long term (current) use of insulin: Secondary | ICD-10-CM

## 2018-09-26 DIAGNOSIS — I10 Essential (primary) hypertension: Secondary | ICD-10-CM

## 2018-09-26 MED ORDER — VITAMIN B-12 ER 1500 MCG PO TBCR: 1.0000 | EXTENDED_RELEASE_TABLET | Freq: Every day | ORAL | 1 refills | Status: DC

## 2018-09-26 MED ORDER — BASAGLAR KWIKPEN 100 UNIT/ML ~~LOC~~ SOPN: 50.0000 [IU] | PEN_INJECTOR | Freq: Every day | SUBCUTANEOUS | 1 refills | Status: DC

## 2018-09-26 MED ORDER — AZILSARTAN-CHLORTHALIDONE 40-12.5 MG PO TABS: 1.0000 | ORAL_TABLET | Freq: Every day | ORAL | 1 refills | Status: DC

## 2018-09-27 ENCOUNTER — Other Ambulatory Visit: Payer: Self-pay

## 2018-09-27 NOTE — Patient Outreach (Signed)
[facility]) Care Management  09/27/2018  Sonia Skinner 1950-10-24 [id number]  TELEPHONE SCREENING Referral date:09/18/18 Referral source:primary MD Referral reason:poorly controlled diabetes Insurance:[employer] health care  Telephone call to patient regarding primary MD referral. HIPAA verified with patient. Explained reason for call. RNCM discussed and offered [facility] care management services. Patient verbally agreed.  Patient states she checks her blood sugar 2-3 times per day. She reports her fasting blood sugars run in the 200's and her most recent A1 C is 9 up from an A1c of 8.  Patient states she is on a medication Synjardy that has caused her problems with diarrhea.  Patient states she would like assistance with an Advance Directive.    PLAN;  RNCm will refer patient to health coach, pharmacist and social worker   Quinn Plowman RN,BSN,CCM [facility] Telephonic  [phone]

## 2018-09-28 ENCOUNTER — Telehealth: Payer: Self-pay | Admitting: Pharmacist

## 2018-09-28 NOTE — Patient Outreach (Signed)
[facility]) Care Management  09/28/2018  Sonia Skinner 06/15/1951 [id number]   Called patient regarding medication assistance. After I described who I was, she told me that she cannot talk during the day because she is in school.  She requested that I call her back after 4pm.    Plan: Send patient an unsuccessful contact letter so that she will recognize who I am when I call her back. Call patient back in 5-7 business days.   Elayne Guerin, PharmD, [place] Clinical Pharmacist [phone]

## 2018-10-02 ENCOUNTER — Other Ambulatory Visit: Payer: Self-pay | Admitting: *Deleted

## 2018-10-04 ENCOUNTER — Other Ambulatory Visit: Payer: Self-pay | Admitting: *Deleted

## 2018-10-04 ENCOUNTER — Encounter: Payer: Self-pay | Admitting: *Deleted

## 2018-10-04 NOTE — Patient Outreach (Signed)
[facility]) Care Management  10/04/2018  Sonia Skinner July 19, 1951 [id number]   CSW made an initial attempt to try and contact patient today to perform the initial phone assessment, as well as assess and assist with social work needs and services, without success.  A HIPAA compliant message was left for patient on voicemail.  CSW is currently awaiting a return call.  CSW will make a second outreach attempt within the next 3-4 business days, if a return call is not received from patient in the meantime.  CSW will also mail an Outreach Letter to patient's home requesting that patient contact CSW if patient is interested in receiving social work services through [place] with Scientist, clinical (histocompatibility and immunogenetics). Nat Christen, BSW, MSW, LCSW  Licensed Education officer, [facility]  Mailing [place] [address] [place], [place] [address] Physical Address-[address], [place], [place] [address] Toll Free Main # [phone] Fax # [phone] Cell # [phone]  Office # [phone] [email or web address]

## 2018-10-05 ENCOUNTER — Ambulatory Visit: Payer: Self-pay | Admitting: *Deleted

## 2018-10-05 ENCOUNTER — Other Ambulatory Visit: Payer: Self-pay | Admitting: Pharmacist

## 2018-10-05 NOTE — Patient Outreach (Signed)
[facility]) Care Management  10/05/2018  Sonia Skinner 12-24-1950 [id number]   Patient was called regarding medication assistance. She answered the phone and said she could not talk until school would be over today which would be around 2pm per early release. She asked me to call her back around 2pm.  I called the patient back at 2:10pm.  She said she was still not home and asked that I call her back in an hour.  Due to my call volume today, patient was asked to call me back when it was best for her.  Plan: Call patient back after 4pm in 7-10 days. If she is still not available at that time, close her case.   Elayne Guerin, PharmD, [place] Clinical Pharmacist [phone]

## 2018-10-06 ENCOUNTER — Other Ambulatory Visit: Payer: Self-pay | Admitting: Internal Medicine

## 2018-10-06 ENCOUNTER — Telehealth: Payer: Self-pay

## 2018-10-06 DIAGNOSIS — E118 Type 2 diabetes mellitus with unspecified complications: Secondary | ICD-10-CM

## 2018-10-06 DIAGNOSIS — Z794 Long term (current) use of insulin: Principal | ICD-10-CM

## 2018-10-06 NOTE — Telephone Encounter (Signed)
Copied from [place] [id number]. Topic: Quick Communication - Rx Refill/Question >> Oct 06, 2018  9:59 AM Bea Graff, NT wrote: Medication: Semaglutide, 1 MG/DOSE, (OZEMPIC, 1 MG/DOSE,) 2 MG/1.5ML SOPN   Has the patient contacted their pharmacy? Yes.   (Agent: If no, request that the patient contact the pharmacy for the refill.) (Agent: If yes, when and what did the pharmacy advise?)  Preferred Pharmacy (with phone number or street name): ***  Agent: Please be advised that RX refills may take up to 3 business days. We ask that you follow-up with your pharmacy.

## 2018-10-06 NOTE — Telephone Encounter (Signed)
PA was approved. Pt informed of approved via detailed vm.

## 2018-10-06 NOTE — Telephone Encounter (Signed)
Key: [id number]  Alternatives tried: Lantus Solostar or Toujeo Max Solostar or [employer] Flextouch

## 2018-10-06 NOTE — Telephone Encounter (Signed)
Spoke with Sonia Skinner, certified tech with [facility] who states that the pt does have 15 remaining pens left on current prescription. Jacqulynn Cadet states that the requested prescription was mailed to the pt on 2/19 but is unable to obtain any tracking information.  Called pt and left message for pt to return call to office. Refills are available at pharmacy and refill of Ozempic was shipped on 10/04/18.

## 2018-10-09 ENCOUNTER — Other Ambulatory Visit: Payer: Self-pay | Admitting: Endocrinology

## 2018-10-10 ENCOUNTER — Other Ambulatory Visit: Payer: Self-pay | Admitting: *Deleted

## 2018-10-10 NOTE — Patient Outreach (Signed)
[facility]) Care Management  10/10/2018  Sonia Skinner [birth year] [id number]   CSW made a second attempt to try and contact patient today to perform phone assessment, as well as assess and assist with social work needs and services, without success.  A HIPAA compliant message was left for patient on voicemail.  CSW continues to await a return call.  CSW will make a third and final outreach attempt within the next 3-4 business days, if a return call is not received from patient in the meantime.  CSW will then proceed with case closure if a return call is not received from patient with a total of 10 business days, as required number of phone attempts will have been made and outreach letter mailed.   Nat Christen, BSW, MSW, LCSW  Licensed Education officer, environmental Health System  Mailing [place] [address], [place], [place] [address] Physical Address-[address], [place], [place] [address] Toll Free Main # [phone] Fax # [phone] Cell # [phone]  Office # [phone] [email or web address]

## 2018-10-16 ENCOUNTER — Other Ambulatory Visit: Payer: Self-pay | Admitting: *Deleted

## 2018-10-16 NOTE — Patient Outreach (Signed)
[facility]) Care Management  10/16/2018  Sonia Skinner 1951/03/17 [id number]    CSW made a third and final attempt to try and contact patient today to perform phone assessment, as well as assess and assist with social work needs and services, without success.  A HIPAA compliant message was left for patient on voicemail.  CSW is currently awaiting a return call.  CSW will proceed with case closure in two business days, if a return call is not received in the meantime, as required number of phone attempts have been made and an outreach letter was mailed to patient's home allowing 10 business days for a response.  Nat Christen, BSW, MSW, LCSW  Licensed Education officer, environmental Health System  Mailing [place] [address] [place], [place] [address] Physical Address-[address], [place], [place] [address] Toll Free Main # [phone] Fax # [phone] Cell # [phone]  Office # [phone] [email or web address]

## 2018-10-18 ENCOUNTER — Other Ambulatory Visit: Payer: Self-pay | Admitting: *Deleted

## 2018-10-18 ENCOUNTER — Encounter: Payer: Self-pay | Admitting: *Deleted

## 2018-10-18 NOTE — Patient Outreach (Signed)
[facility]) Care Management  10/18/2018  CARESS REFFITT 1950/11/30 [id number]   CSW will perform a case closure on patient, due to inability to establish initial phone contact with patient, despite required number of phone attempts made and outreach letter mailed to patient's home allowing 10 business days for a response.  CSW will notify patient's Telephonic RNCM with [place] Management, Quinn Plowman and [place], Hubert Azure ,of CSW's plans to close patient's case.  CSW will fax an update to patient's Primary Care Physician, Dr. Scarlette Calico to ensure that they are aware of CSW's involvement with patient's plan of care.  Nat Christen, BSW, MSW, LCSW  Licensed Education officer, environmental Health System  Mailing [place] [address] [place], [place] [address] Physical Address-[address], [place], [place] [address] Toll Free Main # [phone] Fax # [phone] Cell # [phone]  Office # [phone] [email or web address]

## 2018-10-19 ENCOUNTER — Other Ambulatory Visit: Payer: Self-pay | Admitting: Pharmacist

## 2018-10-19 NOTE — Patient Outreach (Addendum)
[facility]) Care Management  [place]   10/19/2018  JEANISE DURFEY 1950/08/27 [id number]  Reason for referral: medication   Referral source: Telephonic Nursing Referral medication(s): multiple Current insurance:[employer]   HPI: Patient is a 68 year old female with multiple medical conditions including but not limited to: hypothyroidism, hypertension, hypokalemia, depression, type 2 diabetes, Vitamin D deficiency, GERD and history of CVA.   Objective: Allergies  Allergen Reactions  . Anesthetics, Ester Anaphylaxis    Medications Reviewed Today    Reviewed by Elayne Guerin, [facility] (Pharmacist) on 10/19/18 at [place] List Status: <None>  Medication Order Taking? Sig Documenting Provider Last Dose Status Informant  amitriptyline (ELAVIL) 50 MG tablet [id number] Yes Take 1 tablet (50 mg total) by mouth at bedtime. Kathlee Nations, MD Taking Active   aspirin 325 MG tablet [id number] Yes Take 1 tablet (325 mg total) by mouth daily. Samella Parr, NP Taking Active Self  atorvastatin (LIPITOR) 40 MG tablet [id number] Yes Take 1 tablet (40 mg total) by mouth daily. [name], MD Taking Active   Azilsartan-Chlorthalidone 40-12.5 MG TABS [id number] Yes Take 1 tablet by mouth daily. [name], MD Taking Active   Cyanocobalamin (VITAMIN B-12 CR) 1500 MCG TBCR [id number] Yes Take 1 tablet (1,500 mcg total) by mouth daily. [name], MD Taking Active   Empagliflozin-metFORMIN HCl [facility]) 12-998 MG TABS [id number] Yes Take 1 tablet by mouth 2 (two) times daily. [name], MD Taking Active   escitalopram (LEXAPRO) 20 MG tablet [id number] Yes Take 1 tablet (20 mg total) by mouth daily. Kathlee Nations, MD Taking Active   gabapentin (NEURONTIN) 100 MG capsule [id number] Yes Take 1 capsule (100 mg total) by mouth 2 (two) times daily. [name], MD Taking Active   glucose blood (ACCU-CHEK GUIDE) test strip [id number] Yes Use to check blood sugars  five times a day [name], MD Taking Active   Insulin Glargine [facility]) 100 UNIT/ML SOPN [id number] Yes Inject 0.5 mLs (50 Units total) into the skin daily. [name], MD Taking Active   insulin lispro (HUMALOG) 100 UNIT/ML KwikPen [id number] Yes Inject 8 units into the skin at breakfast, 4 units at lunch, and inject 30 units at supper. Renato Shin, MD Taking Active   lamoTRIgine (LAMICTAL) 25 MG tablet [id number] Yes Take two tab daily for 1 week and than 4 tab daily  Patient taking differently:  Take 4 tablets by mouth daily.   Kathlee Nations, MD Taking Active   levothyroxine (SYNTHROID, LEVOTHROID) 125 MCG tablet [id number] Yes Take 1 tablet (125 mcg total) by mouth daily. [name], MD Taking Active   loratadine (CLARITIN) 10 MG tablet [id number] Yes Take 10 mg by mouth daily. [provider] Taking Active   nebivolol (BYSTOLIC) 5 MG tablet [id number] Yes Take 1 tablet (5 mg total) by mouth daily. [name], MD Taking Active   potassium chloride SA (K-DUR,KLOR-CON) 20 MEQ tablet [id number] Yes Take 1 tablet (20 mEq total) by mouth 2 (two) times daily. [name], MD Taking Active   Semaglutide, 1 MG/DOSE, (OZEMPIC, 1 MG/DOSE,) 2 MG/1.5ML SOPN [id number] Yes Inject 1 mg into the skin every Tuesday. [name], MD Taking Active           Assessment:  Drugs sorted by system:  Neurologic/Psychologic: Amitriptyline, Escitalopram, Lamotrigine Gabapentin,   Cardiovascular: Aspirin, Atorvastatin, Edarbyclor, Nebivolol,   Pulmonary/Allergy: Loratadine,  Endocrine: Mickel Crow,  Humalog, Levothyroxine, Ozempic   Vitamins/Minerals/Supplements: Cyanocobalamin, Potassium Chloride,    Medication Review Findings:  . Potential for CNS depression:  Combination of Amitriptyline, Gabapentin, Lamotrigine  Patient originally thought she did not have any more Lamotrigine but we were able to call [facility] on conference call and get  everything settled.   Patient gets her medications delivered to her in pill packs. She had some difficulty getting all of her refills "synced" but said she feels like things are better now. She did get Ozempic from [place] the last time. Patient was educated on how to ask [facility] to transfer the prescription from [place].  Medication Assistance Findings:  No medication assistance needs identified   Patient has FULL LIS so she does not qualify for patient assistance programs.   Additional medication assistance options reviewed with patient as warranted:  No other options identified  Plan: Since she had some confusion, I will call the patient back to be sure her pill packs are working well.  Will reach back out in 2-3 weeks. (Just before time for her to reorder her packs.)  Elayne Guerin, PharmD, [place] Clinical Pharmacist [phone]

## 2018-10-23 ENCOUNTER — Other Ambulatory Visit: Payer: Self-pay | Admitting: *Deleted

## 2018-10-23 ENCOUNTER — Ambulatory Visit: Payer: Self-pay | Admitting: Neurology

## 2018-10-23 NOTE — Patient Outreach (Signed)
[facility]) Care Management  10/23/2018  Sonia Skinner 1951/03/29 [id number]   RN Health Coach Initial Assessment  Referral Date:  09/27/2018 Referral Source:  Primary MD Reason for Referral:  Poorly controlled diabetes Insurance:  [employer]   Outreach Attempt:  Outreach attempt #1 to patient for initial telephone assessment. No answer. RN Health Coach left HIPAA compliant voicemail message along with contact information.  Plan:  RN Health Coach will make another outreach attempt within the month of March.   [place] Coach [phone] [email or web address]

## 2018-10-24 ENCOUNTER — Encounter: Payer: Self-pay | Admitting: Neurology

## 2018-10-25 ENCOUNTER — Other Ambulatory Visit: Payer: Self-pay

## 2018-10-25 ENCOUNTER — Ambulatory Visit (INDEPENDENT_AMBULATORY_CARE_PROVIDER_SITE_OTHER): Payer: Medicare Other | Admitting: Endocrinology

## 2018-10-25 ENCOUNTER — Encounter: Payer: Self-pay | Admitting: Endocrinology

## 2018-10-25 VITALS — BP 132/58 | HR 95 | Ht 62.0 in | Wt 122.6 lb

## 2018-10-25 DIAGNOSIS — E039 Hypothyroidism, unspecified: Secondary | ICD-10-CM

## 2018-10-25 DIAGNOSIS — R200 Anesthesia of skin: Secondary | ICD-10-CM | POA: Diagnosis not present

## 2018-10-25 DIAGNOSIS — R202 Paresthesia of skin: Secondary | ICD-10-CM | POA: Diagnosis not present

## 2018-10-25 DIAGNOSIS — E118 Type 2 diabetes mellitus with unspecified complications: Secondary | ICD-10-CM | POA: Diagnosis not present

## 2018-10-25 DIAGNOSIS — E041 Nontoxic single thyroid nodule: Secondary | ICD-10-CM | POA: Diagnosis not present

## 2018-10-25 LAB — BASIC METABOLIC PANEL
BUN: 34 mg/dL — ABNORMAL HIGH (ref 6–23)
CO2: 25 mEq/L (ref 19–32)
Calcium: 10.7 mg/dL — ABNORMAL HIGH (ref 8.4–10.5)
Chloride: 104 mEq/L (ref 96–112)
Creatinine, Ser: 1.47 mg/dL — ABNORMAL HIGH (ref 0.40–1.20)
GFR: 42.61 mL/min — ABNORMAL LOW (ref >60.00)
Glucose, Bld: 141 mg/dL — ABNORMAL HIGH (ref 70–99)
Potassium: 5.2 mEq/L — ABNORMAL HIGH (ref 3.5–5.1)
Sodium: 137 mEq/L (ref 135–145)

## 2018-10-25 LAB — TSH: TSH: 0.02 u[IU]/mL — ABNORMAL LOW (ref 0.35–4.50)

## 2018-10-25 LAB — VITAMIN B12: Vitamin B-12: 1453 pg/mL — ABNORMAL HIGH (ref 211–911)

## 2018-10-25 LAB — T4, FREE: Free T4: 1.55 ng/dL (ref 0.60–1.60)

## 2018-10-25 MED ORDER — LEVOTHYROXINE SODIUM 50 MCG PO TABS: 50.0000 ug | ORAL_TABLET | Freq: Every day | ORAL | 3 refills | Status: DC

## 2018-10-25 MED ORDER — INSULIN LISPRO (1 UNIT DIAL) 100 UNIT/ML (KWIKPEN): PEN_INJECTOR | SUBCUTANEOUS | 8 refills | Status: DC

## 2018-10-25 NOTE — Patient Instructions (Addendum)
Let's recheck the ultrasound.  you will receive a phone call, about a day and time for an appointment.  Please check your blood sugar twice a day.  vary the time of day when you check, between before the 3 meals, and at bedtime.  also check if you have symptoms of your blood sugar being too high or too low.  please keep a record of the readings and bring it to your next appointment here (or you can bring the meter itself).  You can write it on any piece of paper.  please call us sooner if your blood sugar goes below 70, or if you have a lot of readings over 200.   Please continue the same Ozempic and:  continue basaglar, 50 units at bedtime, and:    change novolog to 3 times a day (just before meals) 01-17-34 units.   Please come back for a follow-up appointment in 6 weeks.

## 2018-10-25 NOTE — Progress Notes (Signed)
Subjective:    Patient ID: Sonia Skinner, female    DOB: July 22, 1951, 68 y.o.   MRN: [id number]  HPI Pt returns for f/u of diabetes mellitus: DM type: Insulin-requiring type 2 (but she is prob is evolving type 1) Dx'ed: 3664 Complications: polyneuropathy, PAD, and CVA Therapy: insulin since 2000 GDM: never DKA: never Severe hypoglycemia: never Pancreatitis: never Other: in 2017, she had episode of nonketotic hyperosmolar hyperglycemic state; She is a Pharmacist, hospital, and has little time at lunch, but she takes multiple daily injections. She says she cannot take insulin at lunch, but she does eat lunch.   Interval history: no cbg record, but states cbg's vary from 50-200's.  It is lowest before lunch, and highest at HS.  It is lower fasting than at HS.  She says she sometimes misses the insulin doses. Pt also has multinodular goiter (dx'ed 2017; left nodule bx was beth cat 1; plan is to repeat US in mid to late-2018; she takes synthroid for hypothyoidism).  Since synthroid was reduced, she has fatigue.   Past Medical History:  Diagnosis Date  . Ankle fracture    Left  . Anxiety   . Carotid artery occlusion   . Closed fracture of left distal fibula 09/16/2017  . Complication of anesthesia    ALLERY TO ESTER BASE  . Depression    early 42s  . Diabetes mellitus    INSULIN DEPENDENT  . GERD (gastroesophageal reflux disease)   . Headache    years ago  . Hypertension   . Pneumonia   . Stroke [facility]) March 2015    left MCA infarct, slight weakness on left side  . Thyroid disease     Past Surgical History:  Procedure Laterality Date  . ABDOMINAL HYSTERECTOMY    . ANTERIOR FIXATION AND POSTERIOR MICRODISCECTOMY CERVICAL SPINE  1999  . CAROTID ENDARTERECTOMY Left 11-04-13   cea  . ENDARTERECTOMY Left 11/04/2013  . ORIF ANKLE FRACTURE Left 09/16/2017   Procedure: OPEN REDUCTION INTERNAL FIXATION (ORIF) LEFT ANKLE FRACTURE;  Surgeon: Marchia Bond, MD;  Location: [facility];  Service: Orthopedics;   Laterality: Left;    Social History   Socioeconomic History  . Marital status: Legally Separated    Spouse name: Not on file  . Number of children: 0  . Years of education: College  . Highest education level: Not on file  Occupational History  . Occupation: [employer]    Employer: [employer]  Social Needs  . Financial resource strain: Not on file  . Food insecurity:    Worry: Not on file    Inability: Not on file  . Transportation needs:    Medical: Not on file    Non-medical: Not on file  Tobacco Use  . Smoking status: Former Smoker    Packs/day: 0.20    Years: 20.00    Pack years: 4.00    Types: Cigarettes    Last attempt to quit: 11/01/2013    Years since quitting: 4.9  . Smokeless tobacco: Never Used  . Tobacco comment: Reports done to 1-2 a day.   Substance and Sexual Activity  . Alcohol use: Yes    Alcohol/week: 1.0 - 2.0 standard drinks    Types: 1 - 2 Standard drinks or equivalent per week  . Drug use: No  . Sexual activity: Not Currently  Lifestyle  . Physical activity:    Days per week: Not on file    Minutes per session: Not on file  . Stress: Not  on file  Relationships  . Social connections:    Talks on phone: Not on file    Gets together: Not on file    Attends religious service: Not on file    Active member of club or organization: Not on file    Attends meetings of clubs or organizations: Not on file    Relationship status: Not on file  . Intimate partner violence:    Fear of current or ex partner: Not on file    Emotionally abused: Not on file    Physically abused: Not on file    Forced sexual activity: Not on file  Other Topics Concern  . Not on file  Social History Narrative   Patient lives in 1 story home with sister.   Caffeine Use: 2 cups daily; sodas occasionally   Some college education   Works as Oceanographer for [employer]    Current Outpatient Medications on File Prior to Visit  Medication Sig Dispense Refill  . amitriptyline  (ELAVIL) 50 MG tablet Take 1 tablet (50 mg total) by mouth at bedtime. 30 tablet 1  . aspirin 325 MG tablet Take 1 tablet (325 mg total) by mouth daily.    Marland Kitchen atorvastatin (LIPITOR) 40 MG tablet Take 1 tablet (40 mg total) by mouth daily. 90 tablet 1  . Azilsartan-Chlorthalidone 40-12.5 MG TABS Take 1 tablet by mouth daily. 90 tablet 1  . Cyanocobalamin (VITAMIN B-12 CR) 1500 MCG TBCR Take 1 tablet (1,500 mcg total) by mouth daily. 90 tablet 1  . Empagliflozin-metFORMIN HCl (SYNJARDY) 12-998 MG TABS Take 1 tablet by mouth 2 (two) times daily. 180 tablet 1  . escitalopram (LEXAPRO) 20 MG tablet Take 1 tablet (20 mg total) by mouth daily. 90 tablet 0  . gabapentin (NEURONTIN) 100 MG capsule Take 1 capsule (100 mg total) by mouth 2 (two) times daily. 180 capsule 1  . glucose blood (ACCU-CHEK GUIDE) test strip Use to check blood sugars five times a day 500 each 3  . Insulin Glargine (BASAGLAR KWIKPEN) 100 UNIT/ML SOPN Inject 0.5 mLs (50 Units total) into the skin daily. 45 mL 1  . lamoTRIgine (LAMICTAL) 25 MG tablet Take two tab daily for 1 week and than 4 tab daily (Patient taking differently: Take 4 tablets by mouth daily.) 120 tablet 1  . loratadine (CLARITIN) 10 MG tablet Take 10 mg by mouth daily.    . nebivolol (BYSTOLIC) 5 MG tablet Take 1 tablet (5 mg total) by mouth daily. 90 tablet 1  . Semaglutide, 1 MG/DOSE, (OZEMPIC, 1 MG/DOSE,) 2 MG/1.5ML SOPN Inject 1 mg into the skin every Tuesday. 12 pen 1   No current facility-administered medications on file prior to visit.     Allergies  Allergen Reactions  . Anesthetics, Ester Anaphylaxis    Family History  Problem Relation Age of Onset  . Diabetes Mother   . Heart disease Mother        Before age 60  . Cancer Father        Lung  . Hypertension Sister   . Diabetes Sister   . Diabetes Sister   . Diabetes Sister     BP (!) 132/58 (BP Location: Right Arm, Patient Position: Sitting, Cuff Size: Normal)   Pulse 95   Ht 5\' 2"  (1.575 m)    Wt 122 lb 9.6 oz (55.6 kg)   SpO2 97%   BMI 22.42 kg/m    Review of Systems She denies hypoglycemia    Objective:  Physical Exam VITAL SIGNS:  See vs page GENERAL: no distress NECK:  Pulses: dorsalis pedis intact bilat.   MSK: no deformity of the feet CV: no leg edema Skin:  no ulcer on the feet.  normal color and temp on the feet. Neuro: sensation is intact to touch on the feet.  Ext: There is bilateral onychomycosis of the toenails.  There is a corn on the right 5th toe    Lab Results  Component Value Date   HGBA1C 9.3 (A) 09/18/2018   Lab Results  Component Value Date   TSH 0.02 (L) 10/25/2018      Assessment & Plan:  Insulin-requiring type 2 DM, with PAD.  Worse.  She may need a QD insulin schedule if a1c is still high next time.   Hypothyroidism: overcontrolled.  I have sent a prescription to your pharmacy, to reduce synthroid again MNG: due for recheck  Patient Instructions  Let's recheck the ultrasound.  you will receive a phone call, about a day and time for an appointment.  Please check your blood sugar twice a day.  vary the time of day when you check, between before the 3 meals, and at bedtime.  also check if you have symptoms of your blood sugar being too high or too low.  please keep a record of the readings and bring it to your next appointment here (or you can bring the meter itself).  You can write it on any piece of paper.  please call us sooner if your blood sugar goes below 70, or if you have a lot of readings over 200.   Please continue the same Ozempic and:  continue basaglar, 50 units at bedtime, and:    change novolog to 3 times a day (just before meals) 01-17-34 units.   Please come back for a follow-up appointment in 6 weeks.

## 2018-11-02 ENCOUNTER — Other Ambulatory Visit: Payer: Self-pay | Admitting: Pharmacist

## 2018-11-02 NOTE — Patient Outreach (Signed)
[facility]) Care Management  11/02/2018  Sonia Skinner [birth year] [id number]   Patient was called regarding medication adherence with her pill packs from [facility]. HIPAA identifiers were obtained.  Patient said her Pill Packs were working fine but was not sure if [facility] received the medication changes from her provider.  Review of the patient's chart revealed Dr. Loanne Drilling changed the patient's Levothyroxine from 125 mcg to 50 mcg due to her recent thyroid labs. Dr. Loanne Drilling sent the new prescription to [place]. Patient said she had not picked up her prescription from [place] yet.  [facility] was called with the patient on conference call and her next shipment was reviewed. [facility] was asked to remove Potassium and levothyroxine 149mcg from the next shipment. They were also asked to transfer the new prescription of Levothyroxine from [facility] to [facility].  The patient said she would take 1/2 of a Levothyroxine 129mcg tablet until her next shipment from [place] arrives. This would provide 62.77mcg of levothyroxine. Patient was advised to call her provider about this. (A note will be sent to the patient's provider as well.)  Patient was encouraged to remind her providers to call new prescriptions to [facility] versus a local pharmacy if at all possible.  Plan: Route note to PCP Follow up with patient in 2 weeks.   Elayne Guerin, PharmD, [place] Clinical Pharmacist [phone]

## 2018-11-03 ENCOUNTER — Ambulatory Visit: Payer: Self-pay | Admitting: Pharmacist

## 2018-11-06 ENCOUNTER — Other Ambulatory Visit: Payer: Self-pay | Admitting: *Deleted

## 2018-11-06 NOTE — Patient Outreach (Signed)
[facility]) Care Management  11/06/2018  Sonia Skinner [birth year] [id number]   RN Health Coach Initial Assessment  Referral Date:  09/27/2018 Referral Source:  Primary MD Reason for Referral:  Poorly controlled diabetes Insurance:  [employer]   Outreach Attempt:  Outreach attempt #2 to patient for introduction and initial telephone assessment.  No Answer.  RN Health Coach left HIPAA compliant voice message.  Received telephone call back from patient.  HIPAA verified with patient.  RN Health Coach introduced self and role.  Patient verbally agrees to Disease Management Outreaches.  States she is unable to complete initial telephone assessment at this time and is requesting a call back.  Plan:  RN Health Coach will make another outreach attempt within the next 10 business days per patient request.  Hubert Azure RN [place] [phone] [email or web address]

## 2018-11-08 ENCOUNTER — Other Ambulatory Visit (HOSPITAL_COMMUNITY): Payer: Self-pay | Admitting: Psychiatry

## 2018-11-08 DIAGNOSIS — F331 Major depressive disorder, recurrent, moderate: Secondary | ICD-10-CM

## 2018-11-08 DIAGNOSIS — F411 Generalized anxiety disorder: Secondary | ICD-10-CM

## 2018-11-09 ENCOUNTER — Other Ambulatory Visit: Payer: Self-pay

## 2018-11-09 ENCOUNTER — Ambulatory Visit (HOSPITAL_COMMUNITY): Payer: Medicare Other | Admitting: Psychiatry

## 2018-11-10 ENCOUNTER — Encounter: Payer: Self-pay | Admitting: *Deleted

## 2018-11-10 ENCOUNTER — Other Ambulatory Visit: Payer: Self-pay

## 2018-11-10 ENCOUNTER — Telehealth (HOSPITAL_COMMUNITY): Payer: Self-pay

## 2018-11-10 ENCOUNTER — Other Ambulatory Visit: Payer: Self-pay | Admitting: *Deleted

## 2018-11-10 NOTE — Telephone Encounter (Signed)
Patient states that she never got a call back from you and she is really needing to talk to you

## 2018-11-10 NOTE — Patient Outreach (Signed)
[facility]) Care Management  [place]  11/10/2018   Sonia Skinner 04-30-1951 [id number]   RN Health Coach Initial Assessment   Referral Date:  09/27/2018 Referral Source:  Primary MD Reason for Referral:  Poorly controlled diabetes Insurance:  [employer] Attempt:  Successful telephone outreach to patient for initial telephone assessment.  HIPAA verified with patient.  Patient completed initial telephone assessment.  Social:  Patient lives at home with sister.  Reports being independent with ADLs and IADLs.  Ambulates independently and denies any falls in the past year.  Reports last fall was in January 2019 resulting in a broken ankle.  Drives herself to her medical appointments.  DME in the home include:  CBG meter, blood pressure cuff, scale, upper denture, and eyeglasses.  Conditions:  Per chart review and discussion with patient, PMH include but not limited to:  Diabetes, polyneuropathy, peripheral arterial disease, CVA, left ankle fracture, anxiety, depression, GERD, hypertension, thyroid disease, cervical spine surgery, and left carotid endarterectomy.  Patient reports being diagnosed with diabetes as a teenager and states she is unsure if she is type 1 or type 2 diabetic.  Has been insulin dependent for years now.  Reports this morning fasting blood sugar was 33.  States she is asymptomatic but did sleep a little longer than normal and is eating breakfast now.  Reports she has fasting blood sugars below 50 about twice a week and other times they range 70-90's.  States she is asymptomatic with hypoglycemic episodes during the daytime also.  Discussed patient eating bedtime snack.  Instructed and encouraged patient to contact provider and notify him of current hypoglycemic episodes for possible medication adjustments.  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  Last  Hgb A1C was 9.3 on 09/18/2018.  Denies any recent emergency room visits or hospitalizations.  Medications: Reports taking about 15 medications a day.  States she manages her medications herself and has pill packs delivered.  She has worked with [facility] Pharmacist to ensure pill packs are correct.  Denies any issues with affording medications.  Encounter Medications:  Outpatient Encounter Medications as of 11/10/2018  Medication Sig  . amitriptyline (ELAVIL) 50 MG tablet Take 1 tablet (50 mg total) by mouth at bedtime.  Marland Kitchen aspirin 325 MG tablet Take 1 tablet (325 mg total) by mouth daily.  Marland Kitchen atorvastatin (LIPITOR) 40 MG tablet Take 1 tablet (40 mg total) by mouth daily.  . Azilsartan-Chlorthalidone 40-12.5 MG TABS Take 1 tablet by mouth daily.  . Cyanocobalamin (VITAMIN B-12 CR) 1500 MCG TBCR Take 1 tablet (1,500 mcg total) by mouth daily.  . Empagliflozin-metFORMIN HCl (SYNJARDY) 12-998 MG TABS Take 1 tablet by mouth 2 (two) times daily.  Marland Kitchen escitalopram (LEXAPRO) 20 MG tablet Take 1 tablet (20 mg total) by mouth daily.  Marland Kitchen gabapentin (NEURONTIN) 100 MG capsule Take 1 capsule (100 mg total) by mouth 2 (two) times daily.  Marland Kitchen glucose blood (ACCU-CHEK GUIDE) test strip Use to check blood sugars five times a day  . Insulin Glargine (BASAGLAR KWIKPEN) 100 UNIT/ML SOPN Inject 0.5 mLs (50 Units total) into the skin daily.  . insulin lispro (HUMALOG) 100 UNIT/ML KwikPen Inject 6 units into the skin at breakfast, 4 units at lunch, and inject 35 units at supper.  . lamoTRIgine (LAMICTAL) 25 MG tablet Take two tab daily for 1 week and than 4 tab daily (Patient taking differently: Take 4  tablets by mouth daily.)  . levothyroxine (SYNTHROID, LEVOTHROID) 50 MCG tablet Take 1 tablet (50 mcg total) by mouth daily before breakfast.  . loratadine (CLARITIN) 10 MG tablet Take 10 mg by mouth daily.  . nebivolol (BYSTOLIC) 5 MG tablet Take 1 tablet (5 mg total) by mouth daily.  . Semaglutide, 1 MG/DOSE, (OZEMPIC, 1 MG/DOSE,)  2 MG/1.5ML SOPN Inject 1 mg into the skin every Tuesday.   No facility-administered encounter medications on file as of 11/10/2018.     Functional Status:  In your present state of health, do you have any difficulty performing the following activities: 11/10/2018 02/20/2018  Hearing? Y N  Comment difficulty hearing in left ear, needs hearing aid -  Vision? N N  Difficulty concentrating or making decisions? N N  Walking or climbing stairs? N N  Dressing or bathing? N N  Doing errands, shopping? N N  Preparing Food and eating ? N -  Using the Toilet? N -  In the past six months, have you accidently leaked urine? N -  Do you have problems with loss of bowel control? N -  Managing your Medications? N -  Managing your Finances? N -  Housekeeping or managing your Housekeeping? N -  Some recent data might be hidden    Fall/Depression Screening: Fall Risk  11/10/2018 07/26/2018 03/03/2018  Falls in the past year? 0 0 No  Number falls in past yr: - - -  Injury with Fall? - - -  Risk for fall due to : History of fall(s) - -  Follow up Falls evaluation completed;Falls prevention discussed;Education provided - -   PHQ 2/9 Scores 11/10/2018 09/27/2018 07/26/2018 06/21/2018 02/20/2018 06/29/2017 11/20/2015  PHQ - 2 Score 0 0 4 4 6  0 0  PHQ- 9 Score - - - 17 14 - -   THN CM Care Plan Problem One     Most Recent Value  Care Plan Problem One  Knowledge deficiet related to self care management of diabetes  Role Documenting the Problem One  [place] for Problem One  Active  THN Long Term Goal   Patient will decrease Hgb A1C by 0.2 points in the next 60 days.  THN Long Term Goal Start Date  11/10/18  Interventions for Problem One Long Term Goal  Current care plan and goals reviewed and discussed with patient, encouraged to keep and attend scheduled medical appointments, reviewed medications and indications and encouraged medication compliance, sending 2020 Calendar booklet to help with  recording blood sugars and blood pressures, sending Living Well with Diabetes Booklet, discussed current Hgb A1C and ways to help reduce, reviewed and encuraged diabetic diet  THN CM Short Term Goal #1   Patient will report a decrease in episodes of hypoglycemia in the next 60 days.  THN CM Short Term Goal #1 Start Date  11/10/18  Interventions for Short Term Goal #1  Reviewed signs and symptoms of hypo and hyperglycemia, encouraged patient to eat a bedtime snack, directed and encouraged patient to contact provider to adjust medications, discussed dangers of hypoglycemic events     Appointments:  Attended appointment with Dr. Ronnald Ramp, primary care provider on 09/18/2018 and has follow up appointment on 11/20/2018.  Attended appointment with Dr. Loanne Drilling, Endocrinologist on 10/25/2018 and has scheduled follow up on 12/06/2018.  Advanced Directives:  Denies having an Advanced Directive in place.  States she wishes to create one, but declines Emergency planning/management officer to be mailed out at this time.  Consent:  [facility] services reviewed and discussed.  Patient verbally agrees to Disease Management outreaches.  Plan: RN Health Coach will send primary MD barriers letter. RN Health Coach will route initial telephone assessment note to primary MD. [place] will send patient [place]. RN Health Coach will send patient 2020 Calendar Booklet. RN Health Coach will send patient Living Well with Diabetes Booklet. RN Health Coach will make next telephone outreach to patient in the month of May.  [place] [phone] [email or web address]

## 2018-11-13 ENCOUNTER — Ambulatory Visit (INDEPENDENT_AMBULATORY_CARE_PROVIDER_SITE_OTHER): Payer: Medicare Other | Admitting: Psychiatry

## 2018-11-13 ENCOUNTER — Other Ambulatory Visit: Payer: Self-pay

## 2018-11-13 DIAGNOSIS — F339 Major depressive disorder, recurrent, unspecified: Secondary | ICD-10-CM

## 2018-11-13 DIAGNOSIS — F411 Generalized anxiety disorder: Secondary | ICD-10-CM

## 2018-11-13 DIAGNOSIS — F331 Major depressive disorder, recurrent, moderate: Secondary | ICD-10-CM

## 2018-11-13 MED ORDER — BUPROPION HCL ER (XL) 150 MG PO TB24: 150.0000 mg | ORAL_TABLET | Freq: Every day | ORAL | 0 refills | Status: DC

## 2018-11-13 MED ORDER — ESCITALOPRAM OXALATE 10 MG PO TABS: 10.0000 mg | ORAL_TABLET | Freq: Every day | ORAL | 0 refills | Status: DC

## 2018-11-13 MED ORDER — AMITRIPTYLINE HCL 50 MG PO TABS: 50.0000 mg | ORAL_TABLET | Freq: Every day | ORAL | 0 refills | Status: DC

## 2018-11-13 NOTE — Telephone Encounter (Signed)
I called patient and she is willing to do  A phone visit today, front desk is putting in the appointment and the best number to reach her at is [phone]

## 2018-11-13 NOTE — Progress Notes (Signed)
Virtual Visit via Telephone Note  I connected with Sonia Skinner on 11/13/18 at 11:10 AM EDT by telephone and verified that I am speaking with the correct person using two identifiers.   I discussed the limitations, risks, security and privacy concerns of performing an evaluation and management service by telephone and the availability of in person appointments. I also discussed with the patient that there may be a patient responsible charge related to this service. The patient expressed understanding and agreed to proceed.   History of Present Illness: Patient was evaluated through phone conversation.  Patient appears to be irritable on phone because she reported none of the medicine working.  She admitted getting easily frustrated and tired.  On her last visit we increase Lamictal to 100 mg but she does not see any improvement.  She does not like Lamictal and believe that we are pushing the Lamictal dose.  I asked her to give [place] all the medication dosage in detail.  She is taking amitriptyline 50 mg at bedtime, Lexapro 20 mg in the morning and Lamictal 100 mg in the morning.  She denies any rash or any itching.  Since she is getting medication from pill pack she is taking the medication every day.  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  She lives with her sister.  Though she denies any paranoia, hallucination but reported getting easily tired, lack of energy and depressed.  She has headaches.  She mention amitriptyline helping the sleep but she has no motivation to do things.  She like to try a different medication.  In the past she had taken Wellbutrin and do not recall any side effects.  Patient has a stroke and she does not want any medication that cause tremors.  Her energy level is fair.  She denies drinking or using any illegal substances.  Recently she had blood work and her hemoglobin A1c is 9.3.    Past Psychiatric History: Reviewed. H/O taking antidepressant on and off most of her life.  H/O inpatient at [facility] after overdose.  Saw physician at [facility] and given the Strattera, Adderall, Zoloft, Celexa. Never tested for ADD. No h/o psychosis, mania or hallucination. In 2001 she moved to [facility] and saw psychiatrist there and given amitriptyline and Wellbutrin to stop smoking.   Recent Results (from the past 2160 hour(s))  POCT HgB A1C     Status: Abnormal   Collection Time: 09/18/18  4:07 PM  Result Value Ref Range   Hemoglobin A1C 9.3 (A) 4.0 - 5.6 %   HbA1c POC (<> result, manual entry)     HbA1c, POC (prediabetic range)     HbA1c, POC (controlled diabetic range)    TSH     Status: Abnormal   Collection Time: 09/18/18  4:28 PM  Result Value Ref Range   TSH 0.16 (L) 0.35 - 4.50 uIU/mL  Basic metabolic panel     Status: Abnormal   Collection Time: 09/18/18  4:28 PM  Result Value Ref Range   Sodium 140 135 - 145 mEq/L   Potassium 4.2 3.5 - 5.1 mEq/L   Chloride 103 96 - 112 mEq/L   CO2 27 19 - 32 mEq/L   Glucose, Bld 153 (H) 70 - 99 mg/dL   BUN 16 6 - 23 mg/dL   Creatinine, Ser 1.09 0.40 - 1.20 mg/dL   Calcium 10.4  8.4 - 10.5 mg/dL   GFR 60.19 >60.00 mL/min  Vitamin B12     Status: Abnormal   Collection Time: 10/25/18  3:59 PM  Result Value Ref Range   Vitamin B-12 1,453 (H) 211 - 911 pg/mL  Basic metabolic panel     Status: Abnormal   Collection Time: 10/25/18  3:59 PM  Result Value Ref Range   Sodium 137 135 - 145 mEq/L   Potassium 5.2 (H) 3.5 - 5.1 mEq/L   Chloride 104 96 - 112 mEq/L   CO2 25 19 - 32 mEq/L   Glucose, Bld 141 (H) 70 - 99 mg/dL   BUN 34 (H) 6 - 23 mg/dL   Creatinine, Ser 1.47 (H) 0.40 - 1.20 mg/dL   Calcium 10.7 (H) 8.4 - 10.5 mg/dL   GFR 42.61 (L) >60.00 mL/min  TSH     Status: Abnormal   Collection Time: 10/25/18  3:59 PM  Result Value Ref Range   TSH 0.02 (L) 0.35 - 4.50 uIU/mL  T4, free     Status: None   Collection  Time: 10/25/18  3:59 PM  Result Value Ref Range   Free T4 1.55 0.60 - 1.60 ng/dL    Comment: Specimens from patients who are undergoing biotin therapy and /or ingesting biotin supplements may contain high levels of biotin.  The higher biotin concentration in these specimens interferes with this Free T4 assay.  Specimens that contain high levels  of biotin may cause false high results for this Free T4 assay.  Please interpret results in light of the total clinical presentation of the patient.      Mental status examination: Limited mental stat examination done on the phone.  Patient appears irritable and frustrated on the phone.  Her speech is slow but clear and coherent.  There were no flight of ideas or any loose association.  She denies any suicidal thoughts homicidal thoughts, hallucination or any paranoia.  She described her mood sad irritable and tired.  There were no delusions or grandiosity present.  She is alert and oriented x3.  Her fund of knowledge is adequate.  Her cognition is fair.  She reported no tremors, shakes or any EPS.  Her insight judgment is fair.   Assessment and Plan: Major depressive disorder, recurrent.  Generalized anxiety disorder.  I discussed in length about her medication and side effects.  She does not feel Lamictal helping her and I will discontinue.  She do not recall any rash or any itching.  Even though she is not taking therapeutic dose of Lamictal she is reluctant to go further on Lamictal.  I gave her option of Abilify but she does not want any medication that cause tremors, weight gain due to history of stroke and I agree with it.  In the past she tried Wellbutrin and do not recall any side effects.  I recommended to try low-dose Wellbutrin 150 mg in the morning, reduce Lexapro from 20 mg to 10 mg and discontinue Lamictal.  She like to continue amitriptyline since it is helping her sleep.  I also reviewed her blood work results and problem list.  She agreed  with the plan.  She like to have Wellbutrin to [facility] local pharmacy because pill pack usually deliver the medication once a month and she recently got the shipment and cannot wait for another month.  I discussed that if she feel that symptoms are getting worse then she should call us back.  Discussed medication side effects especially  multiple antidepressant can cause tremors.  I also reminded reducing Lamictal may cause her worsening of mood lability.  Patient promised that she will call us back.  If symptoms do not improve we will consider gene site testing however in that case she need to come into the office to provide swab specimen.  She is not interested in therapy.  Discussed safety concern that anytime having active suicidal thoughts or homicidal thought that she need to call 911 or go to local emergency room.  Follow-up in 4 weeks.   Follow Up Instructions:   I discussed the assessment and treatment plan with the patient. The patient was provided an opportunity to ask questions and all were answered. The patient agreed with the plan and demonstrated an understanding of the instructions.   The patient was advised to call back or seek an in-person evaluation if the symptoms worsen or if the condition fails to improve as anticipated.  I provided 30 minutes of non-face-to-face time during this encounter.   Kathlee Nations, MD

## 2018-11-13 NOTE — Telephone Encounter (Signed)
I did call her back same day but she did not pick up the phone.  There is opening at 11:00 this morning.  If she is ready I can talk to her at 11:00 today.  Please inform me if she wants to pursue.

## 2018-11-15 ENCOUNTER — Telehealth: Payer: Self-pay | Admitting: Endocrinology

## 2018-11-15 ENCOUNTER — Telehealth (HOSPITAL_COMMUNITY): Payer: Self-pay

## 2018-11-15 ENCOUNTER — Other Ambulatory Visit: Payer: Self-pay | Admitting: Pharmacist

## 2018-11-15 DIAGNOSIS — F331 Major depressive disorder, recurrent, moderate: Secondary | ICD-10-CM

## 2018-11-15 NOTE — Telephone Encounter (Signed)
Spoke to pharmacist and provided clarification

## 2018-11-15 NOTE — Patient Outreach (Signed)
[facility]) Care Management  11/15/2018  ARLISA LECLERE [birth year] [id number]   Patient was called regarding pill packs. HIPAA identifiers were obtained. Despite calling about the levothyroxine on 11/02/2018, [facility] did not include the Levothyroxine 60mcg in the patient's pill pack.    [facility] was called on conference. The representative said the levothyroxine would be over nighted to the patient today in a separate package.  In addition, the patient said Dr. Adele Schilder started her on Bupropion XL 150mg . She had the prescription filled at [facility] on [address].    [facility] reviewed the next shipment and patient approved.  Plan: Follow up with patient in 1 month.   Elayne Guerin, PharmD, [place] Clinical Pharmacist [phone]

## 2018-11-15 NOTE — Telephone Encounter (Signed)
She requetsed to send her Wellbutrin at [employer]. Not to [facility]

## 2018-11-15 NOTE — Telephone Encounter (Signed)
[facility] has called stating that they need clarification on the patients levothyroxine (SYNTHROID, LEVOTHROID) 50 MCG tablet  Please Advise, Thanks  Ph# [phone] option 3

## 2018-11-15 NOTE — Telephone Encounter (Signed)
Medication refill request - Fax received from [facility] for a refill of patient's prescribed Bupropion XL 150 mg. A new order was sent 11/13/18 to [facility] but needs to go to [place].  Patient has a visit set for 11/21/18.

## 2018-11-17 ENCOUNTER — Ambulatory Visit: Payer: Self-pay | Admitting: Pharmacist

## 2018-11-19 IMAGING — MG DIGITAL SCREENING BILATERAL MAMMOGRAM WITH TOMO AND CAD
8 series · 9 of 24 positions shown · non-contrast
Comparison: Previous exam(s).

CLINICAL DATA: Screening.

EXAM:
DIGITAL SCREENING BILATERAL MAMMOGRAM WITH TOMO AND CAD

[L CC synth-2D]
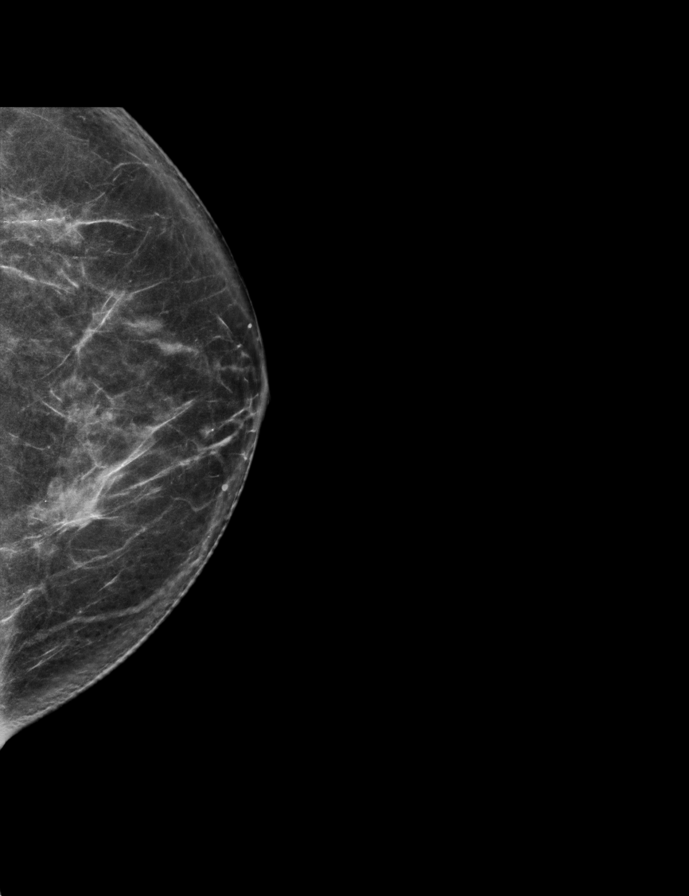

[L MLO synth-2D]
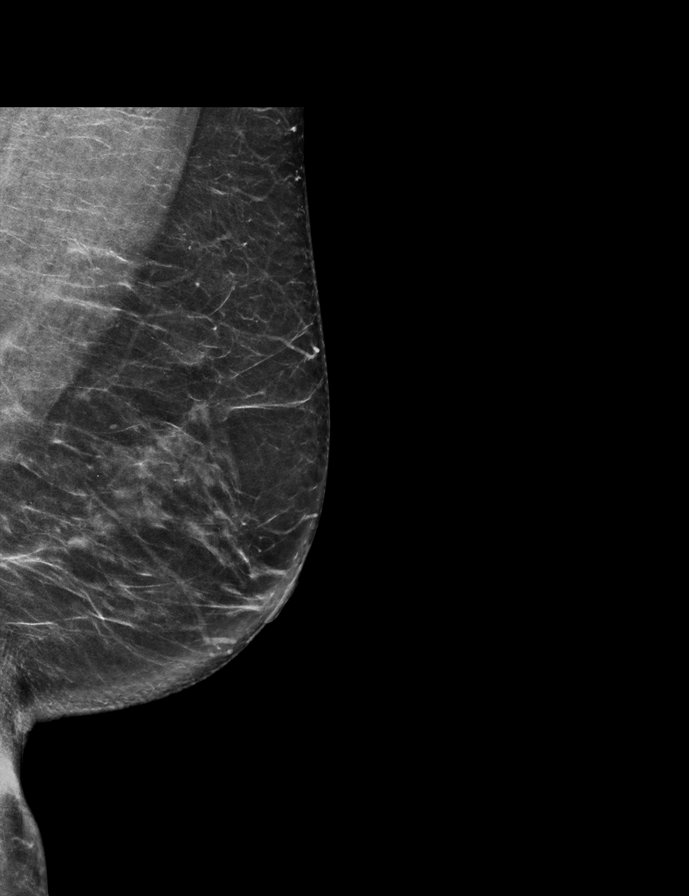

[R MLO synth-2D]
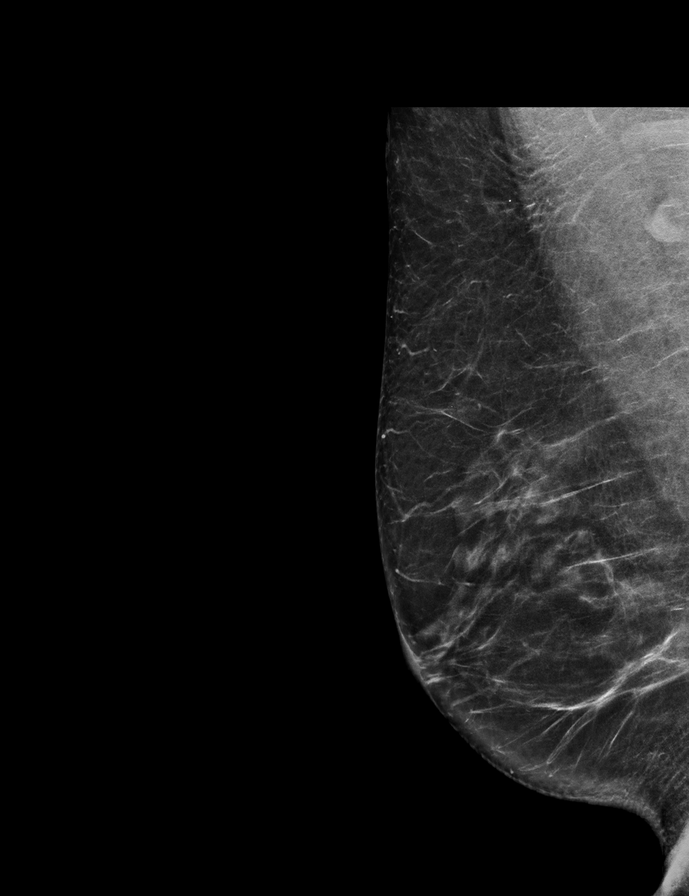

[R CC synth-2D]
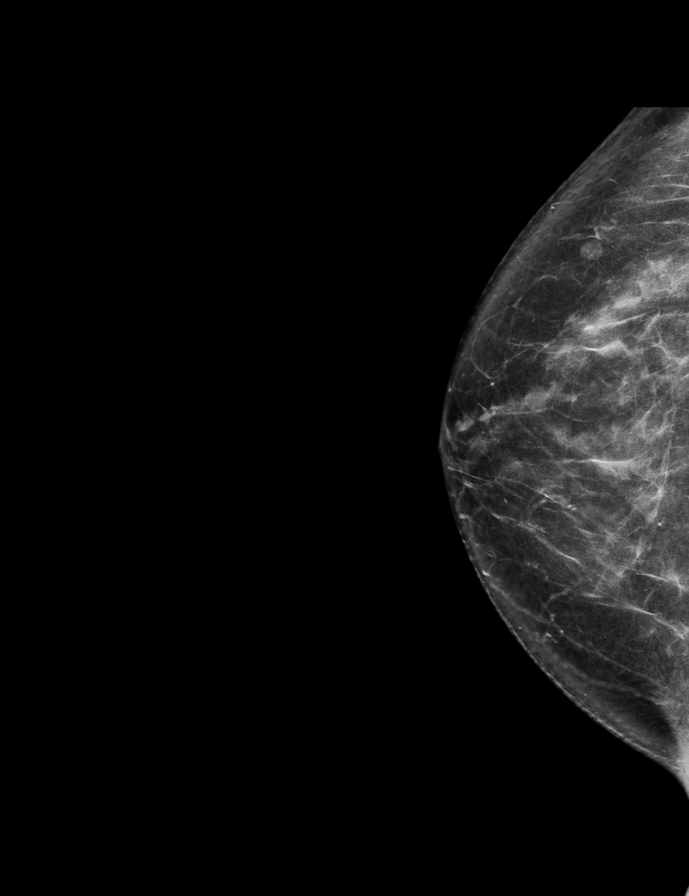

[L MLO tomo · 2 of 69 frames shown]
[frame 23/69]
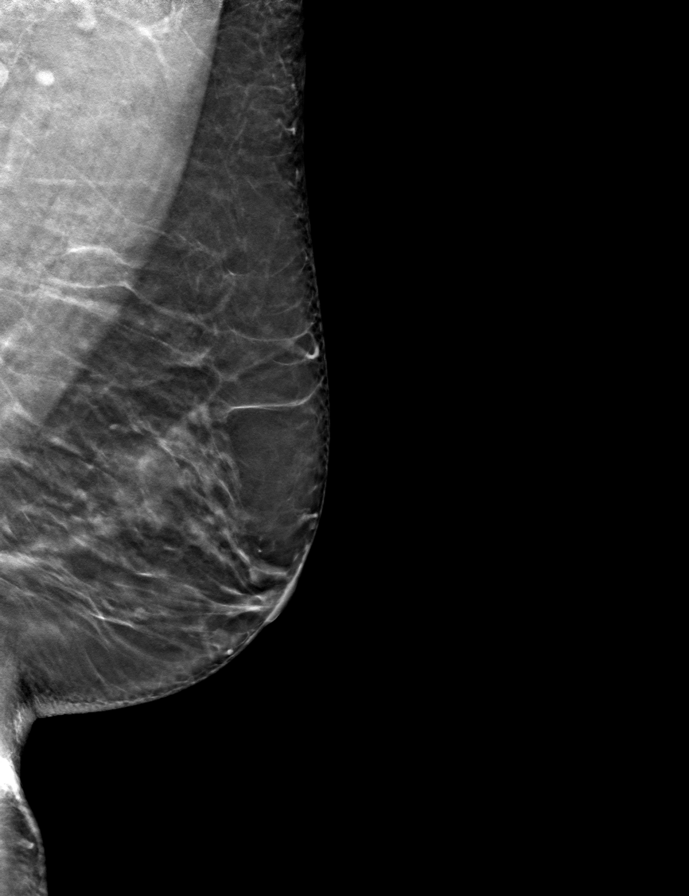
[frame 35/69]
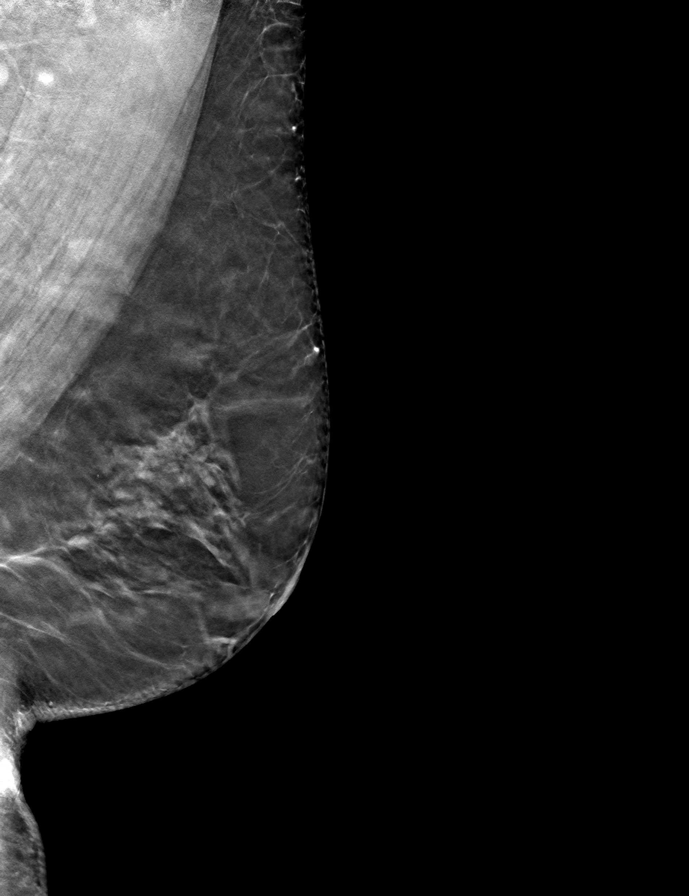

[L CC tomo · tomo slice 35/70.0]
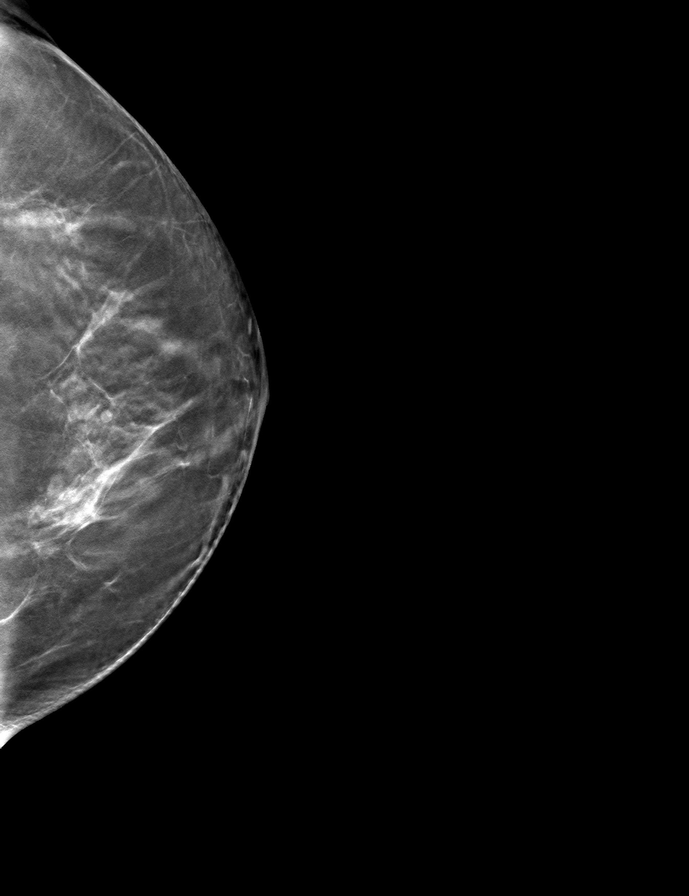

[R MLO tomo · tomo slice 35/70.0]
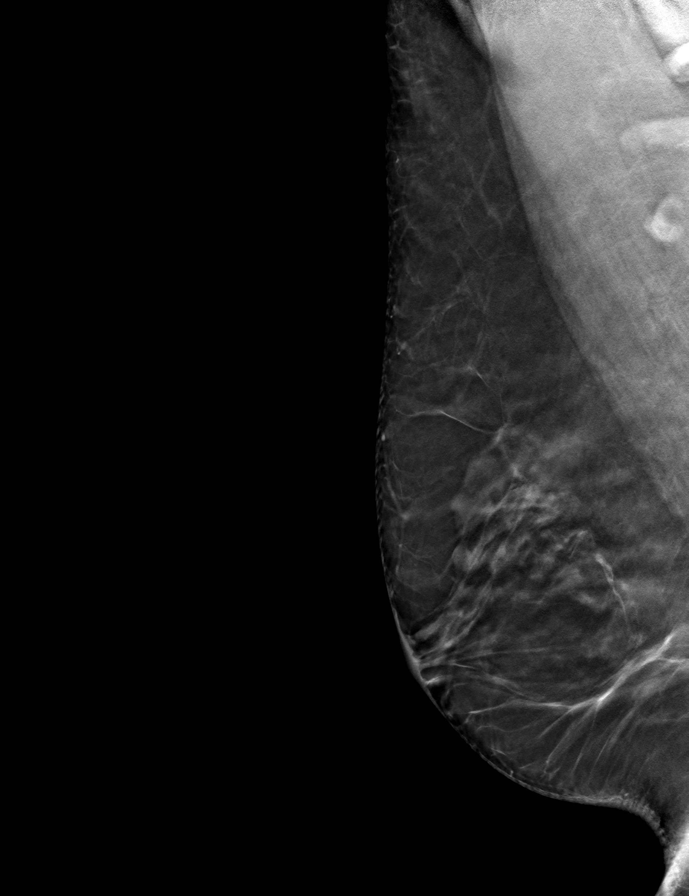

[R CC tomo · tomo slice 35/70.0]
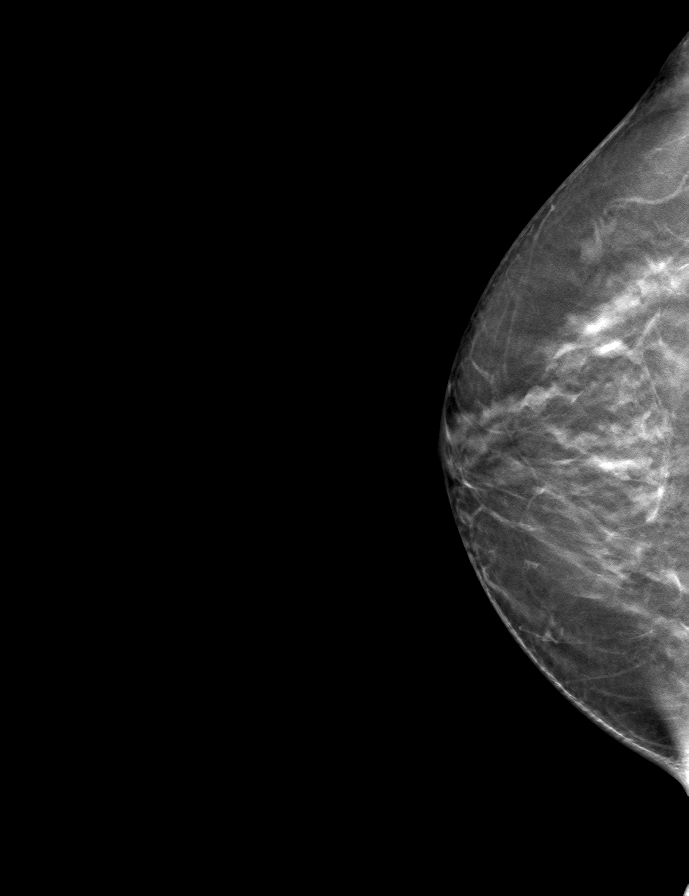

[9 of 24 positions shown; findings below may reference images not displayed]

ACR Breast Density Category b: There are scattered areas of
fibroglandular density.
FINDINGS: There are no findings suspicious for malignancy. Images were
processed with CAD.
IMPRESSION: No mammographic evidence of malignancy. A result letter of this
screening mammogram will be mailed directly to the patient.

RECOMMENDATION:
Screening mammogram in one year. (Code:[id number])

BI-RADS CATEGORY  1: Negative.

## 2018-11-20 ENCOUNTER — Ambulatory Visit: Payer: Self-pay | Admitting: Internal Medicine

## 2018-11-21 ENCOUNTER — Other Ambulatory Visit: Payer: Self-pay

## 2018-11-21 ENCOUNTER — Ambulatory Visit (INDEPENDENT_AMBULATORY_CARE_PROVIDER_SITE_OTHER): Payer: Medicare Other | Admitting: Psychiatry

## 2018-11-21 DIAGNOSIS — F411 Generalized anxiety disorder: Secondary | ICD-10-CM

## 2018-11-21 DIAGNOSIS — F331 Major depressive disorder, recurrent, moderate: Secondary | ICD-10-CM

## 2018-11-21 DIAGNOSIS — F339 Major depressive disorder, recurrent, unspecified: Secondary | ICD-10-CM | POA: Diagnosis not present

## 2018-11-21 MED ORDER — BUPROPION HCL ER (XL) 300 MG PO TB24: 300.0000 mg | ORAL_TABLET | Freq: Every day | ORAL | 0 refills | Status: DC

## 2018-11-21 NOTE — Progress Notes (Signed)
Virtual Visit via Telephone Note  I connected with Sonia Skinner on 11/21/18 at  9:00 AM EDT by telephone and verified that I am speaking with the correct person using two identifiers.   I discussed the limitations, risks, security and privacy concerns of performing an evaluation and management service by telephone and the availability of in person appointments. I also discussed with the patient that there may be a patient responsible charge related to this service. The patient expressed understanding and agreed to proceed.   History of Present Illness: Patient was evaluated through phone session.  On her last visit we stopped the Lamictal and tried Wellbutrin.  She also taper down Lexapro.  She see a huge improvement.  She has more energy.  She reported no crying spells and her irritability is also much better.  She is sleeping good with amitriptyline.  She reported no side effects of the medication.  She is still taking Lexapro 10 mg daily.  She appears more relaxed on the phone.  She reported no tremors shakes or any EPS.  She denies any paranoia or any hallucination.  She has no flight of ideas and she feels the medicine adjustments is working for her.  Past Psychiatric History:Reviewed. H/O taking antidepressant on and off most of her life. H/O inpatient at [facility] after overdose. Saw physician at [facility] and given the Strattera, Adderall, Zoloft, Celexa, Lamictal and Lexapro.  Never tested for ADHD. No h/o psychosis, mania or hallucination. In 2001 she moved to [facility] and saw psychiatrist and given amitriptyline and Wellbutrin to stop smoking.   Observations/Objective: Limited mental status examination done on the phone.  Patient appears more relaxed and pleasant on the phone.  Her speech is soft but clear and coherent.  She does not appear to be irritable.  Her attention and concentration is also improved.  She denies any auditory or visual hallucination.  She denies  any active or passive suicidal thoughts or homicidal thought.  Her fund of knowledge is fair.  There were no delusions or any paranoia.  She is alert and oriented x3.  Her insight and judgment is okay.  Assessment and Plan: Major depressive disorder, recurrent.  Generalized anxiety disorder.  Patient doing better on Wellbutrin 150 mg.  She is no longer taking Lamictal however taking Lexapro 10 mg only.  I recommend to discontinue Lexapro and try higher dose of Wellbutrin to 300 mg in the morning.  She preferred to send her Wellbutrin to pill pack pharmacy.  I also recommend to continue 50 mg amitriptyline at night for sleep.  Discussed medication side effects and benefits.  Recommended to call us back if she has any question or any concern.  Follow-up in 2 months.  Follow Up Instructions:    I discussed the assessment and treatment plan with the patient. The patient was provided an opportunity to ask questions and all were answered. The patient agreed with the plan and demonstrated an understanding of the instructions.   The patient was advised to call back or seek an in-person evaluation if the symptoms worsen or if the condition fails to improve as anticipated.  I provided 15 minutes of non-face-to-face time during this encounter.   Kathlee Nations, MD

## 2018-11-27 ENCOUNTER — Other Ambulatory Visit: Payer: Self-pay | Admitting: Internal Medicine

## 2018-11-27 NOTE — Telephone Encounter (Signed)
Requested medication (s) are due for refill today: Yes  Requested medication (s) are on the active medication list: No   Future visit scheduled: Yes  Notes to clinic: Unable to refill per protocol     Requested Prescriptions  Pending Prescriptions Disp Refills   dexlansoprazole (DEXILANT) 60 MG capsule 90 capsule 0    Sig: Take 1 capsule (60 mg total) by mouth daily.     Gastroenterology: Proton Pump Inhibitors Passed - 11/27/2018  5:25 PM      Passed - Valid encounter within last 12 months    Recent Outpatient Visits          2 months ago Hypokalemia   [place], MD   5 months ago Acquired hypothyroidism   [place], MD   6 months ago Post-traumatic headache, not intractable, unspecified chronicity pattern   [place], Julia, [place]   9 months ago Vitamin D deficiency   [place], Thomas L, MD   1 year ago Need for influenza vaccination   [place], MD      Future Appointments            In 1 week Ronnald Ramp Arvid Right, MD [place], [facility]

## 2018-11-28 MED ORDER — DEXLANSOPRAZOLE 60 MG PO CPDR: 60.0000 mg | DELAYED_RELEASE_CAPSULE | Freq: Every day | ORAL | 0 refills | Status: DC

## 2018-11-28 NOTE — Telephone Encounter (Signed)
Pt have f/u appt scheduled for 12/04/18. Sent refill to requested pharmacy.Marland KitchenJohny Chess

## 2018-12-04 ENCOUNTER — Ambulatory Visit: Payer: Self-pay | Admitting: Internal Medicine

## 2018-12-05 ENCOUNTER — Encounter: Payer: Self-pay | Admitting: Endocrinology

## 2018-12-06 ENCOUNTER — Other Ambulatory Visit: Payer: Self-pay

## 2018-12-06 ENCOUNTER — Ambulatory Visit: Payer: Medicare Other | Admitting: Endocrinology

## 2018-12-06 ENCOUNTER — Ambulatory Visit (INDEPENDENT_AMBULATORY_CARE_PROVIDER_SITE_OTHER): Payer: Medicare Other | Admitting: Endocrinology

## 2018-12-06 DIAGNOSIS — E118 Type 2 diabetes mellitus with unspecified complications: Secondary | ICD-10-CM | POA: Diagnosis not present

## 2018-12-06 DIAGNOSIS — Z794 Long term (current) use of insulin: Secondary | ICD-10-CM | POA: Diagnosis not present

## 2018-12-06 DIAGNOSIS — E039 Hypothyroidism, unspecified: Secondary | ICD-10-CM

## 2018-12-06 NOTE — Patient Instructions (Addendum)
Please check your blood sugar twice a day.  vary the time of day when you check, between before the 3 meals, and at bedtime.  also check if you have symptoms of your blood sugar being too high or too low.  please keep a record of the readings and bring it to your next appointment here (or you can bring the meter itself).  You can write it on any piece of paper.  please call us sooner if your blood sugar goes below 70, or if you have a lot of readings over 200.   Please come back for a follow-up appointment in 2 months.

## 2018-12-06 NOTE — Progress Notes (Signed)
Subjective:    Patient ID: Sonia Skinner, female    DOB: 1951/04/28, 68 y.o.   MRN: [id number]  HPI  telehealth visit today via telephone visit.  Alternatives to telehealth are presented to this patient, and the patient agrees to the telehealth visit. She is advised of the cost of the visit, and she agrees to this, also.   Patient is at home, and I am at the office.   Pt returns for f/u of diabetes mellitus: DM type: Insulin-requiring type 2 (but she is prob is evolving type 1).   Dx'ed: 7564 Complications: polyneuropathy, PAD, and CVA Therapy: insulin since 2000 GDM: never DKA: never Severe hypoglycemia: never Pancreatitis: never Other: in 2017, she had episode of nonketotic hyperosmolar hyperglycemic state; She is a Pharmacist, hospital, and has little time at lunch, but she takes multiple daily injections. She says she cannot take insulin at lunch, but she does eat lunch.   Interval history: pt states cbg's vary from 40-121.  It is in general higher as the day goes on.  She has reduced basaglar to 35 units qhs, and she has stopped the humalog altogether.  Pt also has multinodular goiter (dx'ed 2017; left nodule bx was beth cat 1; plan is to repeat US in mid to late-2018; she takes synthroid for hypothyoidism).  pt states she feels well in general, except for fatigue.  Past Medical History:  Diagnosis Date  . Ankle fracture    Left  . Anxiety   . Carotid artery occlusion   . Closed fracture of left distal fibula 09/16/2017  . Complication of anesthesia    ALLERY TO ESTER BASE  . Depression    early 61s  . Diabetes mellitus    INSULIN DEPENDENT  . GERD (gastroesophageal reflux disease)   . Headache    years ago  . Hypertension   . Pneumonia   . Stroke [facility]) March 2015    left MCA infarct, slight weakness on left side  . Thyroid disease     Past Surgical History:  Procedure Laterality Date  . ABDOMINAL HYSTERECTOMY    . ANTERIOR FIXATION AND POSTERIOR MICRODISCECTOMY CERVICAL SPINE   1999  . CAROTID ENDARTERECTOMY Left 11-04-13   cea  . ENDARTERECTOMY Left 11/04/2013  . ORIF ANKLE FRACTURE Left 09/16/2017   Procedure: OPEN REDUCTION INTERNAL FIXATION (ORIF) LEFT ANKLE FRACTURE;  Surgeon: Marchia Bond, MD;  Location: [facility];  Service: Orthopedics;  Laterality: Left;    Social History   Socioeconomic History  . Marital status: Legally Separated    Spouse name: Not on file  . Number of children: 0  . Years of education: College  . Highest education level: Not on file  Occupational History  . Occupation: [employer]    Employer: [employer]  Social Needs  . Financial resource strain: Not very hard  . Food insecurity:    Worry: Never true    Inability: Never true  . Transportation needs:    Medical: No    Non-medical: No  Tobacco Use  . Smoking status: Former Smoker    Packs/day: 0.20    Years: 20.00    Pack years: 4.00    Types: Cigarettes    Last attempt to quit: 11/01/2013    Years since quitting: 5.1  . Smokeless tobacco: Never Used  . Tobacco comment: Reports done to 1-2 a day.   Substance and Sexual Activity  . Alcohol use: Yes    Alcohol/week: 1.0 - 2.0 standard drinks  Types: 1 - 2 Standard drinks or equivalent per week  . Drug use: No  . Sexual activity: Not Currently  Lifestyle  . Physical activity:    Days per week: 0 days    Minutes per session: 0 min  . Stress: Not at all  Relationships  . Social connections:    Talks on phone: More than three times a week    Gets together: More than three times a week    Attends religious service: More than 4 times per year    Active member of club or organization: Yes    Attends meetings of clubs or organizations: More than 4 times per year    Relationship status: Separated  . Intimate partner violence:    Fear of current or ex partner: No    Emotionally abused: No    Physically abused: No    Forced sexual activity: No  Other Topics Concern  . Not on file  Social History Narrative   Patient  lives in 1 story home with sister.   Caffeine Use: 2 cups daily; sodas occasionally   Some college education   Works as Oceanographer for [employer]    Current Outpatient Medications on File Prior to Visit  Medication Sig Dispense Refill  . amitriptyline (ELAVIL) 50 MG tablet Take 1 tablet (50 mg total) by mouth at bedtime. 90 tablet 0  . aspirin 325 MG tablet Take 1 tablet (325 mg total) by mouth daily.    . Azilsartan-Chlorthalidone 40-12.5 MG TABS Take 1 tablet by mouth daily. 90 tablet 1  . buPROPion (WELLBUTRIN XL) 300 MG 24 hr tablet Take 1 tablet (300 mg total) by mouth daily. 90 tablet 0  . Cyanocobalamin (VITAMIN B-12 CR) 1500 MCG TBCR Take 1 tablet (1,500 mcg total) by mouth daily. 90 tablet 1  . dexlansoprazole (DEXILANT) 60 MG capsule Take 1 capsule (60 mg total) by mouth daily. 90 capsule 0  . Empagliflozin-metFORMIN HCl (SYNJARDY) 12-998 MG TABS Take 1 tablet by mouth 2 (two) times daily. 180 tablet 1  . glucose blood (ACCU-CHEK GUIDE) test strip Use to check blood sugars five times a day 500 each 3  . loratadine (CLARITIN) 10 MG tablet Take 10 mg by mouth daily.    . Semaglutide, 1 MG/DOSE, (OZEMPIC, 1 MG/DOSE,) 2 MG/1.5ML SOPN Inject 1 mg into the skin every Tuesday. 12 pen 1   No current facility-administered medications on file prior to visit.     Allergies  Allergen Reactions  . Anesthetics, Ester Anaphylaxis    Family History  Problem Relation Age of Onset  . Diabetes Mother   . Heart disease Mother        Before age 64  . Cancer Father        Lung  . Hypertension Sister   . Diabetes Sister   . Diabetes Sister   . Diabetes Sister      Review of Systems She has lost weight since last ov.  Denies LOC.      Objective:   Physical Exam   Lab Results  Component Value Date   HGBA1C 8.5 (H) 12/06/2018   Lab Results  Component Value Date   TSH 27.16 (H) 12/06/2018      Assessment & Plan:  Hypothyroidism: worse.  Increase synthroid.   Insulin-requiring type 2 DM, with PAD: increase insulin  Patient Instructions  Please check your blood sugar twice a day.  vary the time of day when you check, between before the 3 meals,  and at bedtime.  also check if you have symptoms of your blood sugar being too high or too low.  please keep a record of the readings and bring it to your next appointment here (or you can bring the meter itself).  You can write it on any piece of paper.  please call us sooner if your blood sugar goes below 70, or if you have a lot of readings over 200.   Please come back for a follow-up appointment in 2 months.

## 2018-12-07 LAB — HEMOGLOBIN A1C: Hgb A1c MFr Bld: 8.5 % — ABNORMAL HIGH (ref 4.6–6.5)

## 2018-12-07 LAB — T4, FREE: Free T4: 0.92 ng/dL (ref 0.60–1.60)

## 2018-12-07 LAB — TSH: TSH: 27.16 u[IU]/mL — ABNORMAL HIGH (ref 0.35–4.50)

## 2018-12-07 MED ORDER — INSULIN LISPRO (1 UNIT DIAL) 100 UNIT/ML (KWIKPEN): 5.0000 [IU] | PEN_INJECTOR | Freq: Three times a day (TID) | SUBCUTANEOUS | 8 refills | Status: DC

## 2018-12-07 MED ORDER — LEVOTHYROXINE SODIUM 100 MCG PO TABS: 100.0000 ug | ORAL_TABLET | Freq: Every day | ORAL | 3 refills | Status: DC

## 2018-12-07 MED ORDER — BASAGLAR KWIKPEN 100 UNIT/ML ~~LOC~~ SOPN: 35.0000 [IU] | PEN_INJECTOR | Freq: Every day | SUBCUTANEOUS | 1 refills | Status: DC

## 2018-12-07 MED ORDER — BASAGLAR KWIKPEN 100 UNIT/ML ~~LOC~~ SOPN: 20.0000 [IU] | PEN_INJECTOR | Freq: Every day | SUBCUTANEOUS | 1 refills | Status: DC

## 2018-12-08 ENCOUNTER — Other Ambulatory Visit: Payer: Self-pay | Admitting: Internal Medicine

## 2018-12-08 DIAGNOSIS — I10 Essential (primary) hypertension: Secondary | ICD-10-CM

## 2018-12-08 DIAGNOSIS — E785 Hyperlipidemia, unspecified: Secondary | ICD-10-CM

## 2018-12-08 DIAGNOSIS — I63512 Cerebral infarction due to unspecified occlusion or stenosis of left middle cerebral artery: Secondary | ICD-10-CM

## 2018-12-08 DIAGNOSIS — G43011 Migraine without aura, intractable, with status migrainosus: Secondary | ICD-10-CM

## 2018-12-18 ENCOUNTER — Other Ambulatory Visit: Payer: Self-pay | Admitting: Pharmacist

## 2018-12-18 NOTE — Patient Outreach (Signed)
[facility]) Care Management  12/18/2018  Sonia Skinner 1951/03/11 [id number]   Patient was called to follow up on her pill packs. HIPAA identifiers were obtained.  Patient confirmed all of her medications have arrived in her pill packs including Levothyroxine 50 mcg  and Bupropion XL.  From chart review, at the 12/06/18 visit, Dr. Loanne Drilling increased the patient's Levothyroxine form 50 mcg to 100 mcg. The prescription was sent to pill pack.  Patient was told to be on the look-out for her next pill packs to contain Levothyroxine 100 mcg tablets in them.   Patient communicated understanding.  Plan: Since patient's pill packs have been organized and she understands how to use them, patient's pharmacy case will be closed.  Patient has my contact information and understands that she can contact me at any time in the future.    Message will be sent to [place], Hubert Azure, RN who is still active with the patient.  Elayne Guerin, PharmD, [place] Clinical Pharmacist [phone]

## 2018-12-19 ENCOUNTER — Telehealth: Payer: Self-pay

## 2018-12-19 NOTE — Telephone Encounter (Signed)
LOV 12/06/18. Per Dr. Loanne Drilling, f/u appt needed in 2 months. LVM requesting returned call.

## 2019-01-01 ENCOUNTER — Other Ambulatory Visit: Payer: Self-pay | Admitting: *Deleted

## 2019-01-01 NOTE — Patient Outreach (Signed)
[facility]) Care Management  01/01/2019  ZOPHIA MARRONE 1950/09/25 [id number]   RN Health Coach Monthly Outreach  Referral Date:09/27/2018 Referral Source:Primary MD Reason for Referral:Poorly controlled diabetes Insurance:[employer] Medicare   Outreach Attempt:  Outreach attempt #1 to patient for follow up.  Patient answered and stated she was unable to speak at this time, requesting telephone call back.  Plan:  RN Health Coach will attempt another telephone outreach to patient with in the month of May.   [place] [phone] [email or web address]

## 2019-01-07 ENCOUNTER — Other Ambulatory Visit: Payer: Self-pay | Admitting: Internal Medicine

## 2019-01-07 ENCOUNTER — Other Ambulatory Visit (HOSPITAL_COMMUNITY): Payer: Self-pay | Admitting: Psychiatry

## 2019-01-07 DIAGNOSIS — Z794 Long term (current) use of insulin: Secondary | ICD-10-CM

## 2019-01-07 DIAGNOSIS — F411 Generalized anxiety disorder: Secondary | ICD-10-CM

## 2019-01-07 DIAGNOSIS — F331 Major depressive disorder, recurrent, moderate: Secondary | ICD-10-CM

## 2019-01-07 DIAGNOSIS — E118 Type 2 diabetes mellitus with unspecified complications: Secondary | ICD-10-CM

## 2019-01-09 ENCOUNTER — Ambulatory Visit: Payer: Self-pay | Admitting: Internal Medicine

## 2019-01-09 ENCOUNTER — Telehealth: Payer: Self-pay

## 2019-01-09 NOTE — Telephone Encounter (Signed)
LOV 12/06/18. Per Dr. Loanne Drilling, schedule f/u 2 mo. Called pt to schedule appt. Declined to schedule at this time.

## 2019-01-12 ENCOUNTER — Other Ambulatory Visit: Payer: Self-pay | Admitting: *Deleted

## 2019-01-12 NOTE — Patient Outreach (Signed)
[facility]) Care Management  01/12/2019  Sonia Skinner 1950/12/08 [id number]   RN Health Coach Monthly Outreach  Referral Date:09/27/2018 Referral Source:Primary MD Reason for Referral:Poorly controlled diabetes Insurance:[employer] Medicare   Outreach Attempt:  Outreach attempt #2 to patient for follow up. Female answered and stated patient was not home. RN Health Coach left HIPAA compliant message along with contact information.  Plan:  RN Health Coach will make another outreach attempt within the month of June.  [place] Coach [phone] [email or web address]

## 2019-01-22 ENCOUNTER — Encounter (HOSPITAL_COMMUNITY): Payer: Self-pay | Admitting: Psychiatry

## 2019-01-22 ENCOUNTER — Ambulatory Visit (INDEPENDENT_AMBULATORY_CARE_PROVIDER_SITE_OTHER): Payer: Medicare Other | Admitting: Psychiatry

## 2019-01-22 ENCOUNTER — Other Ambulatory Visit: Payer: Self-pay

## 2019-01-22 DIAGNOSIS — E118 Type 2 diabetes mellitus with unspecified complications: Secondary | ICD-10-CM

## 2019-01-22 DIAGNOSIS — F331 Major depressive disorder, recurrent, moderate: Secondary | ICD-10-CM

## 2019-01-22 DIAGNOSIS — F411 Generalized anxiety disorder: Secondary | ICD-10-CM | POA: Diagnosis not present

## 2019-01-22 MED ORDER — AMITRIPTYLINE HCL 50 MG PO TABS: 50.0000 mg | ORAL_TABLET | Freq: Every day | ORAL | 0 refills | Status: DC

## 2019-01-22 MED ORDER — BUPROPION HCL ER (XL) 300 MG PO TB24: 300.0000 mg | ORAL_TABLET | Freq: Every day | ORAL | 0 refills | Status: DC

## 2019-01-22 MED ORDER — AZILSARTAN-CHLORTHALIDONE 40-12.5 MG PO TABS: 1.0000 | ORAL_TABLET | Freq: Every day | ORAL | 1 refills | Status: DC

## 2019-01-22 NOTE — Progress Notes (Signed)
Virtual Visit via Telephone Note  I connected with Sonia Skinner on 01/22/19 at  2:20 PM EDT by telephone and verified that I am speaking with the correct person using two identifiers.   I discussed the limitations, risks, security and privacy concerns of performing an evaluation and management service by telephone and the availability of in person appointments. I also discussed with the patient that there may be a patient responsible charge related to this service. The patient expressed understanding and agreed to proceed.   History of Present Illness: Patient was evaluated by phone session.  She is doing better on Wellbutrin XL 300 mg daily.  She is taking amitriptyline which is helping her sleep.  She is now in the process of downsizing and moving to smaller house.  She lives with her sister.  She feels the current medicine is working as she has no longer crying spells, irritability, mood swings or any feeling of hopelessness.  Her energy level is improved.  She is no longer taking Lexapro.  She has no flight of ideas or any loose association.  She has no tremors, shakes or any EPS.  Her appetite is okay.  She reported her weight is a stable.  PastPsychiatric History:Reviewed. H/Otaking antidepressant on and off most of her life. H/[facility]inpatient [facility] after overdose. [facility]ician [facility] and given the Strattera, Adderall, Zoloft, Celexa, Lamictal and Lexapro. Never tested for ADHD. No h/o psychosis, mania or hallucination. In 2001 she moved to [facility] and saw psychiatrist andgivenamitriptyline and Wellbutrinto stop smoking.    Psychiatric Specialty Exam: Physical Exam  ROS  There were no vitals taken for this visit.There is no height or weight on file to calculate BMI.  General Appearance: NA  Eye Contact:  NA  Speech:  Slow  Volume:  Normal  Mood:  Dysphoric  Affect:  NA  Thought Process:  Goal Directed  Orientation:  Full (Time, Place, and Person)   Thought Content:  Logical  Suicidal Thoughts:  No  Homicidal Thoughts:  No  Memory:  Immediate;   Good Recent;   Good Remote;   Good  Judgement:  Good  Insight:  Good  Psychomotor Activity:  Normal  Concentration:  Concentration: Fair and Attention Span: Fair  Recall:  Good  Fund of Knowledge:  Good  Language:  Good  Akathisia:  No  Handed:  Right  AIMS (if indicated):     Assets:  Communication Skills Desire for Improvement Housing Resilience Social Support  ADL's:  Intact  Cognition:  WNL  Sleep:         Assessment and Plan: Major depressive disorder, recurrent.  Generalized anxiety disorder.  Patient doing better on Wellbutrin since dose increased to 300 on her last visit.  She is tolerating very well.  Continue amitriptyline 50 mg at bedtime.  Discussed medication side effects and benefits.  Recommended to call us back if she has any question or any concern.  Follow-up in 3 months.  Follow Up Instructions:    I discussed the assessment and treatment plan with the patient. The patient was provided an opportunity to ask questions and all were answered. The patient agreed with the plan and demonstrated an understanding of the instructions.   The patient was advised to call back or seek an in-person evaluation if the symptoms worsen or if the condition fails to improve as anticipated.  I provided 15 minutes of non-face-to-face time during this encounter.   Kathlee Nations, MD

## 2019-01-30 DIAGNOSIS — E119 Type 2 diabetes mellitus without complications: Secondary | ICD-10-CM | POA: Diagnosis not present

## 2019-01-30 DIAGNOSIS — Z01818 Encounter for other preprocedural examination: Secondary | ICD-10-CM | POA: Diagnosis not present

## 2019-01-30 DIAGNOSIS — H25811 Combined forms of age-related cataract, right eye: Secondary | ICD-10-CM | POA: Diagnosis not present

## 2019-01-30 DIAGNOSIS — H25812 Combined forms of age-related cataract, left eye: Secondary | ICD-10-CM | POA: Diagnosis not present

## 2019-02-02 ENCOUNTER — Other Ambulatory Visit: Payer: Self-pay | Admitting: *Deleted

## 2019-02-02 NOTE — Patient Outreach (Signed)
[facility]) Care Management  02/02/2019  Sonia Skinner 1951/01/17 [id number]   RN Health Coach Monthly Outreach  Referral Date:09/27/2018 Referral Source:Primary MD Reason for Referral:Poorly controlled diabetes Insurance:[employer] Medicare   Outreach Attempt:  Outreach attempt #3 to patient for follow up.  Patient stating she is in the process of moving and would like a call back in a few weeks after getting settled.   Plan:  RN Health Coach will make another telephone outreach to patient within the month of July per patient request.  Hubert Azure RN [place] [phone] [email or web address]

## 2019-02-06 ENCOUNTER — Other Ambulatory Visit: Payer: Self-pay | Admitting: Internal Medicine

## 2019-02-06 DIAGNOSIS — E118 Type 2 diabetes mellitus with unspecified complications: Secondary | ICD-10-CM

## 2019-02-26 ENCOUNTER — Encounter: Payer: Self-pay | Admitting: *Deleted

## 2019-02-26 ENCOUNTER — Other Ambulatory Visit: Payer: Self-pay | Admitting: *Deleted

## 2019-02-26 NOTE — Patient Outreach (Signed)
[facility]) Care Management  [place]  02/26/2019   Sonia Skinner [birth year] [id number]   RN Health Coach Quarterly Outreach   Referral Date:  09/27/2018 Referral Source:  Primary MD Reason for Referral:  Poorly controlled diabetes Insurance:  [employer] Attempt:  Successful telephone outreach to patient for follow up.  HIPAA verified with patient.  Patient reports having moved and still in the process of unpacking.  Does report she is scheduled to have Cataract surgery later this week.  Admits to not taking Ozempic regulary due to increase in weight loss.  States she has started back taking Ozempic last week.  Also reports increase if blood sugars, mainly due to the stress of moving, not always taking Basaglar insulin dose as ordered nightly, and not sticking to diabetic diet.  States she does not always take the full dose of Basaglar nightly and when she typically does her morning blood sugar is low.  This mornings fasting blood sugar was 55 (after full dose of nightly insulin and full dose of Humolog dinnertime insulin).  States she continues to have lows in the mornings when taking her insulins as prescribed.  Encouraged patient to eat bedtime snack and to contact Endocrinologist concerning insulin doses.  Also discussed writing down blood sugars with insulin doses for physician review.  Encounter Medications:  Outpatient Encounter Medications as of 02/26/2019  Medication Sig Note  . amitriptyline (ELAVIL) 50 MG tablet Take 1 tablet (50 mg total) by mouth at bedtime.   Marland Kitchen aspirin 325 MG tablet Take 1 tablet (325 mg total) by mouth daily.   Marland Kitchen atorvastatin (LIPITOR) 40 MG tablet Take 1 tablet (40 mg total) by mouth daily.   . Azilsartan-Chlorthalidone 40-12.5 MG TABS Take 1 tablet by mouth daily.   Marland Kitchen buPROPion (WELLBUTRIN XL) 300 MG 24 hr tablet Take 1 tablet (300 mg total) by mouth daily.   Marland Kitchen BYSTOLIC 5 MG tablet Take 1 tablet (5 mg  total) by mouth daily.   . Cyanocobalamin (VITAMIN B-12 CR) 1500 MCG TBCR Take 1 tablet (1,500 mcg total) by mouth daily.   Marland Kitchen DEXILANT 60 MG capsule Take 1 capsule (60 mg total) by mouth daily.   . Empagliflozin-metFORMIN HCl (SYNJARDY) 12-998 MG TABS Take 1 tablet by mouth 2 (two) times daily.   Marland Kitchen gabapentin (NEURONTIN) 100 MG capsule Take 1 capsule (100 mg total) by mouth 2 (two) times daily.   Marland Kitchen glucose blood (ACCU-CHEK GUIDE) test strip Use to check blood sugars five times a day   . Insulin Glargine (BASAGLAR KWIKPEN) 100 UNIT/ML SOPN Inject 0.5 mLs (50 Units total) into the skin daily.   . insulin lispro (HUMALOG) 100 UNIT/ML KwikPen Inject 0.05 mLs (5 Units total) into the skin 3 (three) times daily. 02/26/2019: 4 units with breakfast, 6 units with lunch and 35 units with dinner  . levothyroxine (SYNTHROID) 100 MCG tablet Take 1 tablet (100 mcg total) by mouth daily.   Marland Kitchen loratadine (CLARITIN) 10 MG tablet Take 10 mg by mouth daily.   Marland Kitchen OZEMPIC, 1 MG/DOSE, 2 MG/1.5ML SOPN Inject 1 mg into the skin every Tuesday.    No facility-administered encounter medications on file as of 02/26/2019.     Functional Status:  In your present state of health, do you have any difficulty performing the following activities: 11/10/2018  Hearing? Y  Comment difficulty hearing in left ear, needs hearing aid  Vision? N  Difficulty concentrating or making decisions? N  Walking  or climbing stairs? N  Dressing or bathing? N  Doing errands, shopping? N  Preparing Food and eating ? N  Using the Toilet? N  In the past six months, have you accidently leaked urine? N  Do you have problems with loss of bowel control? N  Managing your Medications? N  Managing your Finances? N  Housekeeping or managing your Housekeeping? N  Some recent data might be hidden    Fall/Depression Screening: Fall Risk  02/26/2019 11/10/2018 07/26/2018  Falls in the past year? 0 0 0  Number falls in past yr: - - -  Injury with Fall? -  - -  Risk for fall due to : Impaired vision;Medication side effect History of fall(s) -  Follow up Falls evaluation completed;Education provided;Falls prevention discussed Falls evaluation completed;Falls prevention discussed;Education provided -   [facility] 2/9 Scores 11/10/2018 09/27/2018 07/26/2018 06/21/2018 02/20/2018 06/29/2017 11/20/2015  PHQ - 2 Score 0 0 _0 0 0  PHQ- 9 Score - - - 17 14 - -   THN CM Care Plan Problem One     Most Recent Value  Care Plan Problem One  Knowledge deficiet related to self care management of diabetes  Role Documenting the Problem One  [place] for Problem One  Active  THN Long Term Goal   Patient will decrease Hgb A1C by 0.2 points in the next 90 days. (current 8.5)  THN Long Term Goal Start Date  02/26/19  THN Long Term Goal Met Date  02/26/19  Interventions for Problem One Long Term Goal  Discussed and reviewed current care plan and goals, encouraged patient to schedule primary care follow up appointment, reviewed medications and encouraged medication compliance, congratulated patient on reduction of A1C and discussed ways to continue to decrease, encouraged Diabetic Diet food and drink options, discussed importance of glycemic control with healing from eye surgery, encouraged patient to utilize Calendar for blood sugar documentation  THN CM Short Term Goal #1   Patient will report a decrease in episodes of hypoglycemia in the next 60 days.  THN CM Short Term Goal #1 Start Date  02/26/19  Interventions for Short Term Goal #1  Reviewed signs and symptoms of hypo and hyperglycemia, discussed dangers of hypoglycemic events, encouraged patient to eat bedtime snack, encouraged patient to discuss insulin regimen with endocrinologist, encouraged patient to document blood sugar readings along with insulin doses and food intake to take to physician appointment  [facility] CM Short Term Goal #2   Patient will schedule follow up appointment with Endocrinologist within  the next 30 days.  THN CM Short Term Goal #2 Start Date  02/26/19  Interventions for Short Term Goal #2  Discussed importance of medical follow up, confirmed last attended appointment, encouraged patient to schedule endocrinology follow up appointment     Appointments:  Patient needs to schedule appointments with primary care provider, Dr. Ronnald Ramp and Endocrinologist, Dr. Loanne Drilling.  Encouraged patient to schedule appointments as soon as possible.  Reports she is scheduled for Cataract Surgery on 03/01/2019 and 03/15/2019 at [facility].  Plan: RN Health Coach will send primary care provider quarterly update. RN Health Coach will make next telephone outreach to patient within the month of August.    RN [place] [phone] ._1 .Sonia Skinner

## 2019-03-04 ENCOUNTER — Other Ambulatory Visit: Payer: Self-pay | Admitting: Internal Medicine

## 2019-03-04 DIAGNOSIS — G43011 Migraine without aura, intractable, with status migrainosus: Secondary | ICD-10-CM

## 2019-03-04 DIAGNOSIS — I63512 Cerebral infarction due to unspecified occlusion or stenosis of left middle cerebral artery: Secondary | ICD-10-CM

## 2019-03-04 DIAGNOSIS — I10 Essential (primary) hypertension: Secondary | ICD-10-CM

## 2019-03-04 DIAGNOSIS — E785 Hyperlipidemia, unspecified: Secondary | ICD-10-CM

## 2019-03-07 ENCOUNTER — Encounter: Payer: Self-pay | Admitting: Internal Medicine

## 2019-03-08 MED ORDER — INSULIN PEN NEEDLE 32G X 4 MM MISC: 3 refills | Status: DC

## 2019-03-08 NOTE — Telephone Encounter (Signed)
Reviewed chart pt is up-to-date sent rx to [facility].Marland KitchenJohny Chess

## 2019-03-09 ENCOUNTER — Encounter: Payer: Self-pay | Admitting: Internal Medicine

## 2019-03-09 ENCOUNTER — Other Ambulatory Visit: Payer: Self-pay | Admitting: Internal Medicine

## 2019-03-09 DIAGNOSIS — E118 Type 2 diabetes mellitus with unspecified complications: Secondary | ICD-10-CM

## 2019-03-09 MED ORDER — ACCU-CHEK GUIDE VI STRP: ORAL_STRIP | 3 refills | Status: DC

## 2019-03-10 ENCOUNTER — Encounter: Payer: Self-pay | Admitting: Internal Medicine

## 2019-03-10 DIAGNOSIS — E118 Type 2 diabetes mellitus with unspecified complications: Secondary | ICD-10-CM

## 2019-03-12 MED ORDER — LANCETS THIN MISC: 3 refills | Status: DC

## 2019-03-13 ENCOUNTER — Encounter: Payer: Self-pay | Admitting: Internal Medicine

## 2019-03-14 MED ORDER — INSULIN PEN NEEDLE 32G X 4 MM MISC: 3 refills | Status: DC

## 2019-03-15 DIAGNOSIS — H25811 Combined forms of age-related cataract, right eye: Secondary | ICD-10-CM | POA: Diagnosis not present

## 2019-03-15 DIAGNOSIS — H2511 Age-related nuclear cataract, right eye: Secondary | ICD-10-CM | POA: Diagnosis not present

## 2019-03-15 DIAGNOSIS — H905 Unspecified sensorineural hearing loss: Secondary | ICD-10-CM | POA: Diagnosis not present

## 2019-03-29 ENCOUNTER — Other Ambulatory Visit: Payer: Self-pay | Admitting: *Deleted

## 2019-03-29 DIAGNOSIS — H25812 Combined forms of age-related cataract, left eye: Secondary | ICD-10-CM | POA: Diagnosis not present

## 2019-03-29 DIAGNOSIS — H2512 Age-related nuclear cataract, left eye: Secondary | ICD-10-CM | POA: Diagnosis not present

## 2019-03-29 NOTE — Patient Outreach (Signed)
[facility]) Care Management  03/29/2019  Sonia Skinner 02-20-1951 [id number]   RN Health Coach Monthly Outreach  Referral Date:09/27/2018 Referral Source:Primary MD Reason for Referral:Poorly controlled diabetes Insurance:[employer] Medicare   Outreach Attempt:  Outreach attempt #1 to patient for follow up. Patient answered and stated she just had cataract surgery; requested telephone call back.  Plan:  RN Health Coach will make another outreach attempt within the month of August per patient request.  Hubert Azure RN [place] [phone] [email or web address]

## 2019-04-05 NOTE — Progress Notes (Addendum)
Subjective:   Sonia Skinner is a 68 y.o. female who presents for Medicare Annual (Subsequent) preventive examination.  Review of Systems:   Cardiac Risk Factors include: advanced age (>32men, >70 women);diabetes mellitus;dyslipidemia;hypertension Sleep patterns: feels rested on waking, gets up 0-1 times nightly to void and sleeps 8 hours nightly.    Home Safety/Smoke Alarms: Feels safe in home. Smoke alarms in place.  Living environment; residence and Firearm Safety: apartment, no firearms, firearms stored safely. Lives with sister, no needs for DME, good support system Seat Belt Safety/Bike Helmet: Wears seat belt.      Objective:     Vitals: BP 136/78   Pulse 84   Resp 17   Ht 5\' 2"  (1.575 m)   Wt 129 lb (58.5 kg)   SpO2 98%   BMI 23.59 kg/m   Body mass index is 23.59 kg/m.  Advanced Directives 04/06/2019 11/10/2018 09/27/2018 07/26/2018 05/22/2018 03/15/2018 09/10/2017  Does Patient Have a Medical Advance Directive? Yes No No No No No No  Type of Paramedic of [place];Living will - - - - - -  Copy of [place] in Chart? No - copy requested - - - - - -  Would patient like information on creating a medical advance directive? - No - Patient declined No - Patient declined - No - Patient declined Yes (MAU/Ambulatory/Procedural Areas - Information given) -  Pre-existing out of facility DNR order (yellow form or pink MOST form) - - - - - - -    Tobacco Social History   Tobacco Use  Smoking Status Former Smoker  . Packs/day: 0.20  . Years: 20.00  . Pack years: 4.00  . Types: Cigarettes  . Quit date: 11/01/2013  . Years since quitting: 5.4  Smokeless Tobacco Never Used  Tobacco Comment   Reports done to 1-2 a day.      Counseling given: Not Answered Comment: Reports done to 1-2 a day.   Past Medical History:  Diagnosis Date  . Ankle fracture    Left  . Anxiety   . Carotid artery occlusion   . Closed fracture of left distal  fibula 09/16/2017  . Complication of anesthesia    ALLERY TO ESTER BASE  . Depression    early 19s  . Diabetes mellitus    INSULIN DEPENDENT  . GERD (gastroesophageal reflux disease)   . Headache    years ago  . Hypertension   . Pneumonia   . Stroke [facility]) March 2015    left MCA infarct, slight weakness on left side  . Thyroid disease    Past Surgical History:  Procedure Laterality Date  . ABDOMINAL HYSTERECTOMY    . ANTERIOR FIXATION AND POSTERIOR MICRODISCECTOMY CERVICAL SPINE  1999  . CAROTID ENDARTERECTOMY Left 11-04-13   cea  . ENDARTERECTOMY Left 11/04/2013  . ORIF ANKLE FRACTURE Left 09/16/2017   Procedure: OPEN REDUCTION INTERNAL FIXATION (ORIF) LEFT ANKLE FRACTURE;  Surgeon: Marchia Bond, MD;  Location: [facility];  Service: Orthopedics;  Laterality: Left;   Family History  Problem Relation Age of Onset  . Diabetes Mother   . Heart disease Mother        Before age 64  . Cancer Father        Lung  . Hypertension Sister   . Diabetes Sister   . Diabetes Sister   . Diabetes Sister    Social History   Socioeconomic History  . Marital status: Legally Separated    Spouse  name: Not on file  . Number of children: 0  . Years of education: College  . Highest education level: Not on file  Occupational History  . Occupation: [employer]    Employer: [employer]  Social Needs  . Financial resource strain: Not very hard  . Food insecurity    Worry: Never true    Inability: Never true  . Transportation needs    Medical: No    Non-medical: No  Tobacco Use  . Smoking status: Former Smoker    Packs/day: 0.20    Years: 20.00    Pack years: 4.00    Types: Cigarettes    Quit date: 11/01/2013    Years since quitting: 5.4  . Smokeless tobacco: Never Used  . Tobacco comment: Reports done to 1-2 a day.   Substance and Sexual Activity  . Alcohol use: Yes    Alcohol/week: 1.0 - 2.0 standard drinks    Types: 1 - 2 Standard drinks or equivalent per week  . Drug use: No  . Sexual  activity: Not Currently  Lifestyle  . Physical activity    Days per week: 0 days    Minutes per session: 0 min  . Stress: Not at all  Relationships  . Social connections    Talks on phone: More than three times a week    Gets together: More than three times a week    Attends religious service: More than 4 times per year    Active member of club or organization: Yes    Attends meetings of clubs or organizations: More than 4 times per year    Relationship status: Separated  Other Topics Concern  . Not on file  Social History Narrative   Patient lives in 1 story home with sister.   Caffeine Use: 2 cups daily; sodas occasionally   Some college education   Works as Oceanographer for [employer]    Outpatient Encounter Medications as of 04/06/2019  Medication Sig  . amitriptyline (ELAVIL) 50 MG tablet Take 1 tablet (50 mg total) by mouth at bedtime.  Marland Kitchen aspirin 325 MG tablet Take 1 tablet (325 mg total) by mouth daily.  Marland Kitchen atorvastatin (LIPITOR) 40 MG tablet Take 1 tablet (40 mg total) by mouth daily.  . Azilsartan-Chlorthalidone 40-12.5 MG TABS Take 1 tablet by mouth daily.  Marland Kitchen buPROPion (WELLBUTRIN XL) 300 MG 24 hr tablet Take 1 tablet (300 mg total) by mouth daily.  Marland Kitchen BYSTOLIC 5 MG tablet Take 1 tablet (5 mg total) by mouth daily.  . Cyanocobalamin (VITAMIN B-12 CR) 1500 MCG TBCR Take 1 tablet (1,500 mcg total) by mouth daily.  Marland Kitchen DEXILANT 60 MG capsule Take 1 capsule (60 mg total) by mouth daily.  . Empagliflozin-metFORMIN HCl (SYNJARDY) 12-998 MG TABS Take 1 tablet by mouth 2 (two) times daily.  Marland Kitchen gabapentin (NEURONTIN) 100 MG capsule Take 1 capsule (100 mg total) by mouth 2 (two) times daily.  Marland Kitchen glucose blood (ACCU-CHEK GUIDE) test strip Use to check blood sugars five times a day  . Insulin Glargine (BASAGLAR KWIKPEN) 100 UNIT/ML SOPN Inject 0.5 mLs (50 Units total) into the skin daily.  . insulin lispro (HUMALOG) 100 UNIT/ML KwikPen Inject 0.05 mLs (5 Units total) into the skin 3  (three) times daily.  . Insulin Pen Needle 32G X 4 MM MISC Use to administer insulin four times a day  . Lancets Thin MISC Use to check blood sugar five times daily. DX: E11.8  . levothyroxine (SYNTHROID) 100 MCG tablet  Take 1 tablet (100 mcg total) by mouth daily.  Marland Kitchen loratadine (CLARITIN) 10 MG tablet Take 10 mg by mouth daily.  Marland Kitchen OZEMPIC, 1 MG/DOSE, 2 MG/1.5ML SOPN Inject 1 mg into the skin every Tuesday.   No facility-administered encounter medications on file as of 04/06/2019.     Activities of Daily Living In your present state of health, do you have any difficulty performing the following activities: 04/06/2019 11/10/2018  Hearing? N Y  Comment - difficulty hearing in left ear, needs hearing aid  Vision? N N  Difficulty concentrating or making decisions? N N  Walking or climbing stairs? N N  Dressing or bathing? N N  Doing errands, shopping? N N  Preparing Food and eating ? N N  Using the Toilet? N N  In the past six months, have you accidently leaked urine? N N  Do you have problems with loss of bowel control? N N  Managing your Medications? N N  Managing your Finances? N N  Housekeeping or managing your Housekeeping? N N  Some recent data might be hidden    Patient Care Team: [name], MD as PCP - General (Internal Medicine) Leona Singleton, RN as [place] Management Arfeen, Arlyce Harman, MD as Consulting Physician (Psychiatry) Alanda Slim Neena Rhymes, MD as Consulting Physician (Ophthalmology)    Assessment:   This is a routine wellness examination for Sonia Skinner. Physical assessment deferred to PCP.   Exercise Activities and Dietary recommendations Current Exercise Habits: The patient does not participate in regular exercise at present([employer] senior exercise TV program resource provided), Exercise limited by: None identified  Diet (meal preparation, eat out, water intake, caffeinated beverages, dairy products, fruits and vegetables): in general, a  "healthy" diet   Reports poor appetite at times.   Reviewed heart healthy and diabetic diet Discussed nutritional supplements. Encouraged patient to increase daily water and healthy fluid intake.  Goals    . Patient Stated       Fall Risk Fall Risk  04/06/2019 02/26/2019 11/10/2018 07/26/2018 03/03/2018  Falls in the past year? 0 0 0 0 No  Number falls in past yr: 0 - - - -  Injury with Fall? - - - - -  Risk for fall due to : - Impaired vision;Medication side effect History of fall(s) - -  Follow up - Falls evaluation completed;Education provided;Falls prevention discussed Falls evaluation completed;Falls prevention discussed;Education provided - -    Depression Screen PHQ 2/9 Scores 04/06/2019 11/10/2018 09/27/2018 07/26/2018  PHQ - 2 Score 1 0 0 4  PHQ- 9 Score 4 - - -     Cognitive Function MMSE - Mini Mental State Exam 03/03/2018  Orientation to time 5  Orientation to Place 5  Registration 3  Attention/ Calculation 5  Recall 1  Language- name 2 objects 2  Language- repeat 1  Language- follow 3 step command 3  Language- read & follow direction 1  Write a sentence 1  Copy design 1  Total score 28       Ad8 score reviewed for issues:  Issues making decisions: no  Less interest in hobbies / activities: no  Repeats questions, stories (family complaining): no  Trouble using ordinary gadgets (microwave, computer, phone):no  Forgets the month or year: no  Mismanaging finances: no  Remembering appts: no  Daily problems with thinking and/or memory: no Ad8 score is= 0  Immunization History  Administered Date(s) Administered  . Fluad Quad(high Dose 65+) 04/06/2019  . Influenza Split  05/17/2013  . Influenza, High Dose Seasonal PF 05/25/2018  . Influenza,inj,Quad PF,6+ Mos 05/08/2014, 05/13/2015, 06/29/2017  . Pneumococcal Conjugate-13 12/20/2016  . Pneumococcal Polysaccharide-23 11/02/2013, 04/06/2019  . Tdap 11/24/2016   Screening Tests Health Maintenance   Topic Date Due  . OPHTHALMOLOGY EXAM  08/26/2016  . HEMOGLOBIN A1C  06/07/2019  . FOOT EXAM  10/25/2019  . MAMMOGRAM  03/21/2020  . COLONOSCOPY  11/24/2025  . TETANUS/TDAP  11/25/2026  . INFLUENZA VACCINE  Completed  . DEXA SCAN  Completed  . Hepatitis C Screening  Completed  . PNA vac Low Risk Adult  Completed      Plan:     Reviewed health maintenance screenings with patient today and relevant education, vaccines, and/or referrals were provided.   Continue to eat heart healthy diet (full of fruits, vegetables, whole grains, lean protein, water--limit salt, fat, and sugar intake) and increase physical activity as tolerated.  Continue doing brain stimulating activities (puzzles, reading, adult coloring books, staying active) to keep memory sharp.   I have personally reviewed and noted the following in the patient's chart:   . Medical and social history . Use of alcohol, tobacco or illicit drugs  . Current medications and supplements . Functional ability and status . Nutritional status . Physical activity . Advanced directives . List of other physicians . Screenings to include cognitive, depression, and falls . Referrals and appointments  In addition, I have reviewed and discussed with patient certain preventive protocols, quality metrics, and best practice recommendations. A written personalized care plan for preventive services as well as general preventive health recommendations were provided to patient.     Michiel Cowboy, RN  04/06/2019    Medical screening examination/treatment/procedure(s) were performed by non-physician practitioner and as supervising physician I was immediately available for consultation/collaboration. I agree with above. Cathlean Cower, MD

## 2019-04-06 ENCOUNTER — Ambulatory Visit (INDEPENDENT_AMBULATORY_CARE_PROVIDER_SITE_OTHER): Payer: Medicare Other | Admitting: *Deleted

## 2019-04-06 VITALS — BP 136/78 | HR 84 | Resp 17 | Ht 62.0 in | Wt 129.0 lb

## 2019-04-06 DIAGNOSIS — Z0000 Encounter for general adult medical examination without abnormal findings: Secondary | ICD-10-CM

## 2019-04-06 DIAGNOSIS — Z23 Encounter for immunization: Secondary | ICD-10-CM | POA: Diagnosis not present

## 2019-04-06 NOTE — Patient Instructions (Addendum)
If you cannot attend class in person, you can still exercise at home. Video taped versions of [employer] classes are shown on [employer] (GTN) at 8 am and 1 pm Mondays through Fridays. You can also purchase a copy of the AHOY DVD by calling [place] (GTN) [employer]. GTN is available on [employer] channel 13 with a digital cable box and on [employer] channel 31. GTN is also available on [employer], channel 99. To view GTN, go to channel 99, press OK, select [place], then select GTN to start the channel.  Continue doing brain stimulating activities (puzzles, reading, adult coloring books, staying active) to keep memory sharp.   Continue to eat heart healthy diet (full of fruits, vegetables, whole grains, lean protein, water--limit salt, fat, and sugar intake) and increase physical activity as tolerated.   Sonia Skinner , Thank you for taking time to come for your Medicare Wellness Visit. I appreciate your ongoing commitment to your health goals. Please review the following plan we discussed and let me know if I can assist you in the future.   These are the goals we discussed: Goals    . Patient Stated    . Patient Stated     Increase my physical activity by looking into joining a gym. Monitor my diet closely for carbohydrates and sugar.        This is a list of the screening recommended for you and due dates:  Health Maintenance  Topic Date Due  . Eye exam for diabetics  08/26/2016  . Hemoglobin A1C  06/07/2019  . Complete foot exam   10/25/2019  . Mammogram  03/21/2020  . Colon Cancer Screening  11/24/2025  . Tetanus Vaccine  11/25/2026  . Flu Shot  Completed  . DEXA scan (bone density measurement)  Completed  .  Hepatitis C: One time screening is recommended by Center for Disease Control  (CDC) for  adults born from [birth year] through 1967.   Completed  . Pneumonia vaccines  Completed    Preventive Care 19 Years and Older,  Female Preventive care refers to lifestyle choices and visits with your health care provider that can promote health and wellness. This includes:  A yearly physical exam. This is also called an annual well check.  Regular dental and eye exams.  Immunizations.  Screening for certain conditions.  Healthy lifestyle choices, such as diet and exercise. What can I expect for my preventive care visit? Physical exam Your health care provider will check:  Height and weight. These may be used to calculate body mass index (BMI), which is a measurement that tells if you are at a healthy weight.  Heart rate and blood pressure.  Your skin for abnormal spots. Counseling Your health care provider may ask you questions about:  Alcohol, tobacco, and drug use.  Emotional well-being.  Home and relationship well-being.  Sexual activity.  Eating habits.  History of falls.  Memory and ability to understand (cognition).  Work and work Statistician.  Pregnancy and menstrual history. What immunizations do I need?  Influenza (flu) vaccine  This is recommended every year. Tetanus, diphtheria, and pertussis (Tdap) vaccine  You may need a Td booster every 10 years. Varicella (chickenpox) vaccine  You may need this vaccine if you have not already been vaccinated. Zoster (shingles) vaccine  You may need this after age 26. Pneumococcal conjugate (PCV13) vaccine  One dose is recommended after age 49. Pneumococcal polysaccharide (PPSV23) vaccine  One dose is recommended  after age 2. Measles, mumps, and rubella (MMR) vaccine  You may need at least one dose of MMR if you were born in 1957 or later. You may also need a second dose. Meningococcal conjugate (MenACWY) vaccine  You may need this if you have certain conditions. Hepatitis A vaccine  You may need this if you have certain conditions or if you travel or work in places where you may be exposed to hepatitis A. Hepatitis B  vaccine  You may need this if you have certain conditions or if you travel or work in places where you may be exposed to hepatitis B. Haemophilus influenzae type b (Hib) vaccine  You may need this if you have certain conditions. You may receive vaccines as individual doses or as more than one vaccine together in one shot (combination vaccines). Talk with your health care provider about the risks and benefits of combination vaccines. What tests do I need? Blood tests  Lipid and cholesterol levels. These may be checked every 5 years, or more frequently depending on your overall health.  Hepatitis C test.  Hepatitis B test. Screening  Lung cancer screening. You may have this screening every year starting at age 57 if you have a 30-pack-year history of smoking and currently smoke or have quit within the past 15 years.  Colorectal cancer screening. All adults should have this screening starting at age 62 and continuing until age 70. Your health care provider may recommend screening at age 56 if you are at increased risk. You will have tests every 1-10 years, depending on your results and the type of screening test.  Diabetes screening. This is done by checking your blood sugar (glucose) after you have not eaten for a while (fasting). You may have this done every 1-3 years.  Mammogram. This may be done every 1-2 years. Talk with your health care provider about how often you should have regular mammograms.  BRCA-related cancer screening. This may be done if you have a family history of breast, ovarian, tubal, or peritoneal cancers. Other tests  Sexually transmitted disease (STD) testing.  Bone density scan. This is done to screen for osteoporosis. You may have this done starting at age 11. Follow these instructions at home: Eating and drinking  Eat a diet that includes fresh fruits and vegetables, whole grains, lean protein, and low-fat dairy products. Limit your intake of foods with high  amounts of sugar, saturated fats, and salt.  Take vitamin and mineral supplements as recommended by your health care provider.  Do not drink alcohol if your health care provider tells you not to drink.  If you drink alcohol: ? Limit how much you have to 0-1 drink a day. ? Be aware of how much alcohol is in your drink. In the [place] one drink equals one 12 oz bottle of beer (355 mL), one 5 oz glass of  (148 mL), or one 1 oz glass of hard liquor (44 mL). Lifestyle  Take daily care of your teeth and gums.  Stay active. Exercise for at least 30 minutes on 5 or more days each week.  Do not use any products that contain nicotine or tobacco, such as cigarettes, e-cigarettes, and chewing tobacco. If you need help quitting, ask your health care provider.  If you are sexually active, practice safe sex. Use a condom or other form of protection in order to prevent STIs (sexually transmitted infections).  Talk with your health care provider about taking a low-dose aspirin or statin.  What's next?  Go to your health care provider once a year for a well check visit.  Ask your health care provider how often you should have your eyes and teeth checked.  Stay up to date on all vaccines. This information is not intended to replace advice given to you by your health care provider. Make sure you discuss any questions you have with your health care provider. Document Released: 08/29/2015 Document Revised: 07/27/2018 Document Reviewed: 07/27/2018 Elsevier Patient Education  2020 [employer].

## 2019-04-07 ENCOUNTER — Other Ambulatory Visit (HOSPITAL_COMMUNITY): Payer: Self-pay | Admitting: Psychiatry

## 2019-04-07 ENCOUNTER — Other Ambulatory Visit: Payer: Self-pay | Admitting: Internal Medicine

## 2019-04-07 DIAGNOSIS — F331 Major depressive disorder, recurrent, moderate: Secondary | ICD-10-CM

## 2019-04-07 DIAGNOSIS — E118 Type 2 diabetes mellitus with unspecified complications: Secondary | ICD-10-CM

## 2019-04-07 DIAGNOSIS — F411 Generalized anxiety disorder: Secondary | ICD-10-CM

## 2019-04-13 ENCOUNTER — Ambulatory Visit: Payer: Self-pay | Admitting: *Deleted

## 2019-04-16 ENCOUNTER — Other Ambulatory Visit: Payer: Self-pay | Admitting: *Deleted

## 2019-04-16 NOTE — Patient Outreach (Signed)
[facility]) Care Management  04/16/2019  DOMINIK LAURICELLA 1950-11-29 [id number]   RN Health Coach Monthly Outreach  Referral Date:09/27/2018 Referral Source:Primary MD Reason for Referral:Poorly controlled diabetes Insurance:[employer] Medicare   Outreach Attempt:  Outreach attempt #2 to patient for follow up. Patient answered and stated she was not able to speak at the time.  Requested a telephone call back.   Plan:  RN Health Coach will make another outreach attempt within the month of September per patient request.  Hubert Azure RN [place] [phone] [email or web address]

## 2019-04-17 ENCOUNTER — Telehealth: Payer: Self-pay | Admitting: Internal Medicine

## 2019-04-17 DIAGNOSIS — E118 Type 2 diabetes mellitus with unspecified complications: Secondary | ICD-10-CM

## 2019-04-17 NOTE — Telephone Encounter (Signed)
Copied from [place] [phone]. Topic: General - Other >> Apr 17, 2019  9:32 AM Pauline Good wrote: Reason for CRM: pt dropped  her accu chek guide glucose meter and it broke. Pt need new meter. Please call to advise

## 2019-04-18 MED ORDER — BLOOD GLUCOSE MONITOR KIT: PACK | 0 refills | Status: DC

## 2019-04-18 NOTE — Telephone Encounter (Signed)
rx has been faxed to [facility]

## 2019-04-18 NOTE — Telephone Encounter (Signed)
RX printed for PCP to sign.   Called pt - pt stated to send Pillpak

## 2019-04-25 ENCOUNTER — Encounter (HOSPITAL_COMMUNITY): Payer: Self-pay | Admitting: Psychiatry

## 2019-04-25 ENCOUNTER — Other Ambulatory Visit: Payer: Self-pay

## 2019-04-25 ENCOUNTER — Ambulatory Visit (INDEPENDENT_AMBULATORY_CARE_PROVIDER_SITE_OTHER): Payer: Medicare Other | Admitting: Psychiatry

## 2019-04-25 DIAGNOSIS — F331 Major depressive disorder, recurrent, moderate: Secondary | ICD-10-CM | POA: Diagnosis not present

## 2019-04-25 DIAGNOSIS — F411 Generalized anxiety disorder: Secondary | ICD-10-CM

## 2019-04-25 MED ORDER — AMITRIPTYLINE HCL 50 MG PO TABS: 50.0000 mg | ORAL_TABLET | Freq: Every day | ORAL | 0 refills | Status: DC

## 2019-04-25 MED ORDER — BUPROPION HCL ER (XL) 300 MG PO TB24: 300.0000 mg | ORAL_TABLET | Freq: Every day | ORAL | 0 refills | Status: DC

## 2019-04-25 NOTE — Progress Notes (Signed)
Virtual Visit via Telephone Note  I connected with Sonia Skinner on 04/25/19 at  4:00 PM EDT by telephone and verified that I am speaking with the correct person using two identifiers.   I discussed the limitations, risks, security and privacy concerns of performing an evaluation and management service by telephone and the availability of in person appointments. I also discussed with the patient that there may be a patient responsible charge related to this service. The patient expressed understanding and agreed to proceed.   History of Present Illness: Patient was evaluated by phone session.  She is doing well on her current medication.  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  She feels the current medicine is working.  She is sleeping good with the amitriptyline.  She is getting along with her sister much better.  She denies any crying spells, irritability, mood swings, feeling of hopelessness or worthlessness.  Energy level is good.  She denies any anxiety or panic attack.  She feels the current medicine is working.   PastPsychiatric History:Reviewed. H/Otaking antidepressant on and off most of her life. [facility]. [facility] and given the Strattera, Adderall, Zoloft, Celexa, Lamictaland Lexapro.Never tested forADHD. No h/opsychosis, mania or hallucination. In 2001 she moved to [facility] and saw psychiatrist andgivenamitriptyline and Wellbutrinto stop smoking.  Psychiatric Specialty Exam: Physical Exam  ROS  There were no vitals taken for this visit.There is no height or weight on file to calculate BMI.  General Appearance: NA  Eye Contact:  NA  Speech:  Clear and Coherent and Normal Rate  Volume:  Normal  Mood:  Euthymic  Affect:  NA  Thought Process:  Coherent and Goal Directed  Orientation:  Full (Time, Place, and Person)  Thought Content:  WDL  Suicidal Thoughts:  No   Homicidal Thoughts:  No  Memory:  Immediate;   Good Recent;   Good Remote;   Good  Judgement:  Good  Insight:  Good  Psychomotor Activity:  NA  Concentration:  Concentration: Good and Attention Span: Good  Recall:  Good  Fund of Knowledge:  Good  Language:  Good  Akathisia:  mild tremors in left hand  Handed:  Left  AIMS (if indicated):     Assets:  Communication Skills Desire for Improvement Housing Resilience Social Support  ADL's:  Intact  Cognition:  WNL  Sleep:   fair      Assessment and Plan: Major depressive disorder, recurrent.  Generalized anxiety disorder.  Patient is a stable on Wellbutrin XL 300 mg daily and amitriptyline 50 mg at bedtime.  She is also getting gabapentin 100 mg twice a day by her primary care physician.  Discussed medication side effects and benefits.  Recommended to call us back if she has any question or any concern.  Follow-up in 3 months.  Follow Up Instructions:    I discussed the assessment and treatment plan with the patient. The patient was provided an opportunity to ask questions and all were answered. The patient agreed with the plan and demonstrated an understanding of the instructions.   The patient was advised to call back or seek an in-person evaluation if the symptoms worsen or if the condition fails to improve as anticipated.  I provided 20 minutes of non-face-to-face time during this encounter.   Kathlee Nations, MD

## 2019-04-26 ENCOUNTER — Encounter: Payer: Self-pay | Admitting: Internal Medicine

## 2019-04-26 DIAGNOSIS — E118 Type 2 diabetes mellitus with unspecified complications: Secondary | ICD-10-CM

## 2019-04-27 MED ORDER — BLOOD GLUCOSE MONITOR KIT: PACK | 0 refills | Status: DC

## 2019-05-11 ENCOUNTER — Encounter: Payer: Self-pay | Admitting: *Deleted

## 2019-05-11 ENCOUNTER — Other Ambulatory Visit: Payer: Self-pay | Admitting: *Deleted

## 2019-05-11 NOTE — Patient Outreach (Signed)
[facility]) Care Management  05/11/2019  Sonia Skinner 12-04-1950 [id number]   RN Health CoachMonthlyOutreach  Referral Date:09/27/2018 Referral Source:Primary MD Reason for Referral:Poorly controlled diabetes Insurance:[employer] Medicare   Outreach Attempt:  Successful telephone outreach to patient for follow up.  HIPAA verified with patient.  Patient reporting she is still try to settle in from moving.  States she has had cataract surgery on both eyes and now is having periods of seeing blocks in the corner of her eyes that comes and goes.  Encouraged patient to call eye provider for update on symptoms.  Reports obtaining new blood sugar meter within this last week.   Reporting she has been having less frequent hypoglycemic events, reporting maybe 1 or 2 a week with none below 60's.  Blood sugar this morning after breakfast was 116.  Reports medication compliance.  Patient asking about nightly dose of Basaglar and [place] encouraged patient to speak with endocrinologist.  Appointments:  Last attended telehealth appointment with endocrinologist on 12/06/2018 and appointment with primary care provider on 09/18/2018.  Did attend appointment at primary care office for flu shot on 04/06/2019.  Plan: RN Health Coach will make next telephone outreach to patient with in the month of November.  [place] [phone] [email or web address]

## 2019-06-01 ENCOUNTER — Other Ambulatory Visit: Payer: Self-pay | Admitting: Internal Medicine

## 2019-06-01 DIAGNOSIS — Z794 Long term (current) use of insulin: Secondary | ICD-10-CM

## 2019-06-01 DIAGNOSIS — E118 Type 2 diabetes mellitus with unspecified complications: Secondary | ICD-10-CM

## 2019-06-01 DIAGNOSIS — E113519 Type 2 diabetes mellitus with proliferative diabetic retinopathy with macular edema, unspecified eye: Secondary | ICD-10-CM

## 2019-06-06 ENCOUNTER — Other Ambulatory Visit: Payer: Self-pay | Admitting: Endocrinology

## 2019-06-06 NOTE — Telephone Encounter (Signed)
Please advise 

## 2019-06-07 NOTE — Telephone Encounter (Signed)
1.  Please schedule f/u appt 2.  Then please refill x 1, pending that appt.  

## 2019-06-07 NOTE — Telephone Encounter (Signed)
Per Dr. Loanne Drilling, unable to refill Humalog Kwikpen without an appt. Routing this message to the front desk for scheduling purposes.

## 2019-06-08 MED ORDER — INSULIN LISPRO (1 UNIT DIAL) 100 UNIT/ML (KWIKPEN): 5.0000 [IU] | PEN_INJECTOR | Freq: Three times a day (TID) | SUBCUTANEOUS | 1 refills | Status: DC

## 2019-06-08 NOTE — Telephone Encounter (Signed)
Refill sent.

## 2019-06-08 NOTE — Telephone Encounter (Signed)
Patient is scheduled for 06/20/2019 at 2:15pm

## 2019-06-08 NOTE — Addendum Note (Signed)
Addended by: Cardell Peach I on: 06/08/2019 02:06 PM   Modules accepted: Orders

## 2019-06-18 ENCOUNTER — Other Ambulatory Visit: Payer: Self-pay

## 2019-06-20 ENCOUNTER — Ambulatory Visit (INDEPENDENT_AMBULATORY_CARE_PROVIDER_SITE_OTHER): Payer: Medicare Other | Admitting: Endocrinology

## 2019-06-20 ENCOUNTER — Other Ambulatory Visit: Payer: Self-pay

## 2019-06-20 ENCOUNTER — Encounter: Payer: Self-pay | Admitting: Endocrinology

## 2019-06-20 VITALS — BP 152/60 | HR 88 | Ht 62.0 in | Wt 131.2 lb

## 2019-06-20 DIAGNOSIS — Z794 Long term (current) use of insulin: Secondary | ICD-10-CM

## 2019-06-20 DIAGNOSIS — E118 Type 2 diabetes mellitus with unspecified complications: Secondary | ICD-10-CM | POA: Diagnosis not present

## 2019-06-20 DIAGNOSIS — E041 Nontoxic single thyroid nodule: Secondary | ICD-10-CM | POA: Diagnosis not present

## 2019-06-20 LAB — POCT GLYCOSYLATED HEMOGLOBIN (HGB A1C): Hemoglobin A1C: 7.3 % — AB (ref 4.0–5.6)

## 2019-06-20 MED ORDER — BASAGLAR KWIKPEN 100 UNIT/ML ~~LOC~~ SOPN: 50.0000 [IU] | PEN_INJECTOR | SUBCUTANEOUS | 3 refills | Status: DC

## 2019-06-20 NOTE — Patient Instructions (Addendum)
please take the Basaglar, 50 units each morning, and: avoid the Humalog. Please check your blood sugar twice a day.  vary the time of day when you check, between before the 3 meals, and at bedtime.  also check if you have symptoms of your blood sugar being too high or too low.  please keep a record of the readings and bring it to your next appointment here (or you can bring the meter itself).  You can write it on any piece of paper.  please call us sooner if your blood sugar goes below 70, or if you have a lot of readings over 200.   Blood tests are requested for you today.  We'll let you know about the results.  Let's recheck the ultrasound.  you will receive a phone call, about a day and time for an appointment.  Please come back for a follow-up appointment in 2-3 months.

## 2019-06-20 NOTE — Progress Notes (Signed)
Subjective:    Patient ID: Sonia Skinner, female    DOB: 1950-12-13, 68 y.o.   MRN: [id number]  HPI Pt returns for f/u of diabetes mellitus: DM type: Insulin-requiring type 2 (but she is prob is evolving type 1).   Dx'ed: 8592 Complications: polyneuropathy, PAD, and CVA Therapy: insulin since 2000 GDM: never DKA: never Severe hypoglycemia: never Pancreatitis: never Other: in 2017, she had episode of nonketotic hyperosmolar hyperglycemic state; She is a Pharmacist, hospital, and has little time at lunch, but she takes multiple daily injections. She says she cannot take insulin at lunch, but she does eat lunch.   Interval history: pt states she feels well in general, except for fatigue.  She takes Basaglar, 50 units qhs, and PRN humalog (less than 5 total per day).  no cbg record, but states cbg's vary from 65-180.  It is in general lowest fasting.  Pt also has multinodular goiter (dx'ed 2017; left nodule bx was beth cat 1; she takes synthroid for hypothyoidism).  pt states she feels well in general, except for fatigue.  Past Medical History:  Diagnosis Date  . Ankle fracture    Left  . Anxiety   . Carotid artery occlusion   . Closed fracture of left distal fibula 09/16/2017  . Complication of anesthesia    ALLERY TO ESTER BASE  . Depression    early 58s  . Diabetes mellitus    INSULIN DEPENDENT  . GERD (gastroesophageal reflux disease)   . Headache    years ago  . Hypertension   . Pneumonia   . Stroke [facility]) March 2015    left MCA infarct, slight weakness on left side  . Thyroid disease     Past Surgical History:  Procedure Laterality Date  . ABDOMINAL HYSTERECTOMY    . ANTERIOR FIXATION AND POSTERIOR MICRODISCECTOMY CERVICAL SPINE  1999  . CAROTID ENDARTERECTOMY Left 11-04-13   cea  . ENDARTERECTOMY Left 11/04/2013  . ORIF ANKLE FRACTURE Left 09/16/2017   Procedure: OPEN REDUCTION INTERNAL FIXATION (ORIF) LEFT ANKLE FRACTURE;  Surgeon: Marchia Bond, MD;  Location: [facility];  Service:  Orthopedics;  Laterality: Left;    Social History   Socioeconomic History  . Marital status: Legally Separated    Spouse name: Not on file  . Number of children: 0  . Years of education: College  . Highest education level: Not on file  Occupational History  . Occupation: [employer]    Employer: [employer]  Social Needs  . Financial resource strain: Not very hard  . Food insecurity    Worry: Never true    Inability: Never true  . Transportation needs    Medical: No    Non-medical: No  Tobacco Use  . Smoking status: Former Smoker    Packs/day: 0.20    Years: 20.00    Pack years: 4.00    Types: Cigarettes    Quit date: 11/01/2013    Years since quitting: 5.6  . Smokeless tobacco: Never Used  . Tobacco comment: Reports done to 1-2 a day.   Substance and Sexual Activity  . Alcohol use: Yes    Alcohol/week: 1.0 - 2.0 standard drinks    Types: 1 - 2 Standard drinks or equivalent per week  . Drug use: No  . Sexual activity: Not Currently  Lifestyle  . Physical activity    Days per week: 0 days    Minutes per session: 0 min  . Stress: Not at all  Relationships  . Social  connections    Talks on phone: More than three times a week    Gets together: More than three times a week    Attends religious service: More than 4 times per year    Active member of club or organization: Yes    Attends meetings of clubs or organizations: More than 4 times per year    Relationship status: Separated  . Intimate partner violence    Fear of current or ex partner: No    Emotionally abused: No    Physically abused: No    Forced sexual activity: No  Other Topics Concern  . Not on file  Social History Narrative   Patient lives in 1 story home with sister.   Caffeine Use: 2 cups daily; sodas occasionally   Some college education   Works as Oceanographer for [employer]    Current Outpatient Medications on File Prior to Visit  Medication Sig Dispense Refill  . amitriptyline (ELAVIL) 50 MG  tablet Take 1 tablet (50 mg total) by mouth at bedtime. 90 tablet 0  . aspirin 325 MG tablet Take 1 tablet (325 mg total) by mouth daily.    Marland Kitchen atorvastatin (LIPITOR) 40 MG tablet Take 1 tablet (40 mg total) by mouth daily. 90 tablet 1  . Azilsartan-Chlorthalidone 40-12.5 MG TABS Take by mouth.    . blood glucose meter kit and supplies KIT Use to test blood sugars 1 each 0  . buPROPion (WELLBUTRIN XL) 300 MG 24 hr tablet Take 1 tablet (300 mg total) by mouth daily. 90 tablet 0  . BYSTOLIC 5 MG tablet Take 1 tablet (5 mg total) by mouth daily. 90 tablet 1  . Cyanocobalamin (VITAMIN B-12 CR) 1500 MCG TBCR Take 1 tablet (1,500 mcg total) by mouth daily. 90 tablet 1  . DEXILANT 60 MG capsule Take 1 capsule (60 mg total) by mouth daily. 90 capsule 0  . Empagliflozin-metFORMIN HCl (SYNJARDY) 12-998 MG TABS Take 1 tablet by mouth 2 (two) times daily. **OFFICE VISIT IS DUE** 60 tablet 0  . gabapentin (NEURONTIN) 100 MG capsule Take 1 capsule (100 mg total) by mouth 2 (two) times daily. 180 capsule 1  . glucose blood (ACCU-CHEK GUIDE) test strip Use to check blood sugars five times a day 500 each 3  . Insulin Pen Needle 32G X 4 MM MISC Use to administer insulin four times a day 360 each 3  . Lancets Thin MISC Use to check blood sugar five times daily. DX: E11.8 500 each 3  . loratadine (CLARITIN) 10 MG tablet Take 10 mg by mouth daily.    Marland Kitchen OZEMPIC, 1 MG/DOSE, 2 MG/1.5ML SOPN Inject 1 mg into the skin every Tuesday. 9 mL 0   No current facility-administered medications on file prior to visit.     Allergies  Allergen Reactions  . Anesthetics, Ester Anaphylaxis    Family History  Problem Relation Age of Onset  . Diabetes Mother   . Heart disease Mother        Before age 25  . Cancer Father        Lung  . Hypertension Sister   . Diabetes Sister   . Diabetes Sister   . Diabetes Sister     BP (!) 152/60 (BP Location: Right Arm, Patient Position: Sitting, Cuff Size: Normal)   Pulse 88   Ht 5'  2" (1.575 m)   Wt 131 lb 3.2 oz (59.5 kg)   SpO2 97%   BMI 24.00 kg/m  Review of Systems Denies LOC    Objective:   Physical Exam VITAL SIGNS:  See vs page GENERAL: no distress Pulses: dorsalis pedis intact bilat.   MSK: no deformity of the feet CV: no leg edema Skin:  no ulcer on the feet.  normal color and temp on the feet.  Neuro: sensation is intact to touch on the feet  Lab Results  Component Value Date   HGBA1C 7.3 (A) 06/20/2019      Assessment & Plan:  Hypothyroidism: recheck today Insulin-requiring type 2 DM, with PAD: this is the best control this pt should aim for, given this regimen, which does match insulin to his changing needs throughout the day.  Hypoglycemia: this limits aggressiveness of glycemic control.  Goiter: due for recheck.  HTN: recheck next time.   Patient Instructions  please take the Basaglar, 50 units each morning, and: avoid the Humalog. Please check your blood sugar twice a day.  vary the time of day when you check, between before the 3 meals, and at bedtime.  also check if you have symptoms of your blood sugar being too high or too low.  please keep a record of the readings and bring it to your next appointment here (or you can bring the meter itself).  You can write it on any piece of paper.  please call us sooner if your blood sugar goes below 70, or if you have a lot of readings over 200.   Blood tests are requested for you today.  We'll let you know about the results.  Let's recheck the ultrasound.  you will receive a phone call, about a day and time for an appointment.  Please come back for a follow-up appointment in 2-3 months.

## 2019-06-21 LAB — TSH: TSH: 4.73 u[IU]/mL — ABNORMAL HIGH (ref 0.35–4.50)

## 2019-06-21 LAB — T4, FREE: Free T4: 1.26 ng/dL (ref 0.60–1.60)

## 2019-06-21 MED ORDER — LEVOTHYROXINE SODIUM 112 MCG PO TABS: 112.0000 ug | ORAL_TABLET | Freq: Every day | ORAL | 3 refills | Status: DC

## 2019-06-27 ENCOUNTER — Encounter: Payer: Self-pay | Admitting: *Deleted

## 2019-06-27 ENCOUNTER — Other Ambulatory Visit: Payer: Self-pay | Admitting: *Deleted

## 2019-06-27 NOTE — Patient Outreach (Signed)
[facility]) Care Management  [place]  06/27/2019   Sonia Skinner 10/22/1950 [id number]   RN Health Coach Every Other Month Outreach   Referral Date:  09/27/2018 Referral Source:  Primary MD Reason for Referral:  Poorly controlled diabetes Insurance:  [employer] Attempt:  Successful telephone outreach to patient for follow up.  HIPAA verified with patient.  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  Discussed importance of masking ,social distancing, and hand hygiene and to notify providers if she starts to develop any sickness signs and symptoms.  Patient stated her understanding.  Patient is happy with the reduction of her Hgb A1C down to 7.3 on 06/20/2019 and wants to make more dietary changes to maintain A1C reduction.  Reports she needs to monitor her blood sugars more as requested. Does report blood sugars have been ranging 70-150's, with one 60's hypoglycemic event in the past few weeks.  Does acknowledge tiredness and the provider adjusting her synthroid dose.  Patient also reporting she has been occupied with her sick husband and transporting him to multiple medical appointments that she has had to cancel some of her appointments.  Continues to have vision changes after surgery; encouraged to reschedule eye appointment as soon as possible.  Encounter Medications:  Outpatient Encounter Medications as of 06/27/2019  Medication Sig  . amitriptyline (ELAVIL) 50 MG tablet Take 1 tablet (50 mg total) by mouth at bedtime.  Marland Kitchen aspirin 325 MG tablet Take 1 tablet (325 mg total) by mouth daily.  Marland Kitchen atorvastatin (LIPITOR) 40 MG tablet Take 1 tablet (40 mg total) by mouth daily.  . Azilsartan-Chlorthalidone 40-12.5 MG TABS Take by mouth.  . blood glucose meter kit and supplies  KIT Use to test blood sugars  . buPROPion (WELLBUTRIN XL) 300 MG 24 hr tablet Take 1 tablet (300 mg total) by mouth daily.  Marland Kitchen BYSTOLIC 5 MG tablet Take 1 tablet (5 mg total) by mouth daily.  . Cyanocobalamin (VITAMIN B-12 CR) 1500 MCG TBCR Take 1 tablet (1,500 mcg total) by mouth daily.  Marland Kitchen DEXILANT 60 MG capsule Take 1 capsule (60 mg total) by mouth daily.  . Empagliflozin-metFORMIN HCl (SYNJARDY) 12-998 MG TABS Take 1 tablet by mouth 2 (two) times daily. **OFFICE VISIT IS DUE**  . gabapentin (NEURONTIN) 100 MG capsule Take 1 capsule (100 mg total) by mouth 2 (two) times daily.  Marland Kitchen glucose blood (ACCU-CHEK GUIDE) test strip Use to check blood sugars five times a day  . Insulin Glargine (BASAGLAR KWIKPEN) 100 UNIT/ML SOPN Inject 0.5 mLs (50 Units total) into the skin every morning. And pen needles 1/day  . Insulin Lispro (HUMALOG KWIKPEN [place]) Inject 5 Units into the skin 3 (three) times daily with meals as needed.  . Insulin Pen Needle 32G X 4 MM MISC Use to administer insulin four times a day  . Lancets Thin MISC Use to check blood sugar five times daily. DX: E11.8  . levothyroxine (SYNTHROID) 112 MCG tablet Take 1 tablet (112 mcg total) by mouth daily before breakfast.  . loratadine (CLARITIN) 10 MG tablet Take 10 mg by mouth daily.  Marland Kitchen OZEMPIC, 1 MG/DOSE, 2 MG/1.5ML SOPN Inject 1 mg into the skin every Tuesday.   No facility-administered encounter medications on file as of 06/27/2019.     Functional Status:  In your present state of health, do you have any difficulty performing the following activities: 04/06/2019 11/10/2018  Hearing? N Y  Comment - difficulty hearing in left ear, needs hearing aid  Vision? N N  Difficulty concentrating or making decisions? N N  Walking or climbing stairs? N N  Dressing or bathing? N N  Doing errands, shopping? N N  Preparing Food and eating ? N N  Using the Toilet? N N  In the past six months, have you accidently leaked urine? N N  Do you have problems  with loss of bowel control? N N  Managing your Medications? N N  Managing your Finances? N N  Housekeeping or managing your Housekeeping? N N  Some recent data might be hidden    Fall/Depression Screening: Fall Risk  06/27/2019 05/11/2019 04/06/2019  Falls in the past year? 0 0 0  Number falls in past yr: - - 0  Injury with Fall? - - -  Risk for fall due to : Medication side effect;Impaired vision Medication side effect;Impaired vision;Impaired mobility -  Follow up Falls evaluation completed;Education provided;Falls prevention discussed Falls evaluation completed;Education provided;Falls prevention discussed -   PHQ 2/9 Scores 04/06/2019 11/10/2018 09/27/2018 07/26/2018 06/21/2018 02/20/2018 06/29/2017  PHQ - 2 Score 1 0 0 _0 0  PHQ- 9 Score 4 - - - 17 14 -   THN CM Care Plan Problem One     Most Recent Value  Care Plan Problem One  Knowledge deficiet related to self care management of diabetes  Role Documenting the Problem One  [place] for Problem One  Active  THN Long Term Goal   Patient will maintain A1C of 7.5 or below within the next 90 days.  THN Long Term Goal Start Date  06/27/19  THN Long Term Goal Met Date  06/27/19  Interventions for Problem One Long Term Goal  Congratulated patient on A1C reduction, care plan and goals reviewed and discussed, encouraged patient to monitor blood sugars at least twice a day and to document readings as requested by provider, encouraged her to use [facility] Calendar Booklet to document readings and take to next followup appointment as requested by provider, encouraged diabetic food and drink options, discussed and encouraged social distancing/good hand hygiene/wearing mask with start of teaching in classroom instruction, encouraged to keep and attend scheduled medical appointments, encouraged to reschedule cancelled eye appointment,   THN CM Short Term Goal #2   Patient will schedule follow up appointment with Endocrinologist within the next  30 days.  THN CM Short Term Goal #2 Start Date  05/11/19  [facility] CM Short Term Goal #2 Met Date  06/27/19     Appointments:  Attended appointment with Dr. Loanne Drilling, endocrinologist on 06/20/2019 and has scheduled follow up on 09/21/2019.  Encouraged to contact eye provider to reschedule cancelled appointment.  Plan: RN Health Coach will send primary care provider quarterly update. RN Health Coach will make next telephone outreach to patient within the month of January.  [place] [phone] [email or web address]

## 2019-07-01 ENCOUNTER — Other Ambulatory Visit: Payer: Self-pay | Admitting: Internal Medicine

## 2019-07-01 DIAGNOSIS — E118 Type 2 diabetes mellitus with unspecified complications: Secondary | ICD-10-CM

## 2019-07-03 ENCOUNTER — Other Ambulatory Visit: Payer: Self-pay | Admitting: Internal Medicine

## 2019-07-03 ENCOUNTER — Other Ambulatory Visit (HOSPITAL_COMMUNITY): Payer: Self-pay | Admitting: Psychiatry

## 2019-07-03 DIAGNOSIS — E113519 Type 2 diabetes mellitus with proliferative diabetic retinopathy with macular edema, unspecified eye: Secondary | ICD-10-CM

## 2019-07-03 DIAGNOSIS — F411 Generalized anxiety disorder: Secondary | ICD-10-CM

## 2019-07-03 DIAGNOSIS — E118 Type 2 diabetes mellitus with unspecified complications: Secondary | ICD-10-CM

## 2019-07-03 DIAGNOSIS — F331 Major depressive disorder, recurrent, moderate: Secondary | ICD-10-CM

## 2019-07-03 DIAGNOSIS — Z794 Long term (current) use of insulin: Secondary | ICD-10-CM

## 2019-07-18 ENCOUNTER — Ambulatory Visit
Admission: RE | Admit: 2019-07-18 | Discharge: 2019-07-18 | Disposition: A | Discharge status: Home / Self Care | Payer: Medicare Other | Source: Ambulatory Visit | Attending: Endocrinology | Admitting: Endocrinology

## 2019-07-18 DIAGNOSIS — E042 Nontoxic multinodular goiter: Secondary | ICD-10-CM | POA: Diagnosis not present

## 2019-07-18 DIAGNOSIS — E041 Nontoxic single thyroid nodule: Secondary | ICD-10-CM

## 2019-07-19 ENCOUNTER — Encounter (HOSPITAL_COMMUNITY): Payer: Self-pay | Admitting: *Deleted

## 2019-07-25 ENCOUNTER — Other Ambulatory Visit: Payer: Self-pay

## 2019-07-25 ENCOUNTER — Ambulatory Visit (INDEPENDENT_AMBULATORY_CARE_PROVIDER_SITE_OTHER): Payer: Medicare Other | Admitting: Psychiatry

## 2019-07-25 ENCOUNTER — Encounter (HOSPITAL_COMMUNITY): Payer: Self-pay | Admitting: Psychiatry

## 2019-07-25 DIAGNOSIS — F331 Major depressive disorder, recurrent, moderate: Secondary | ICD-10-CM

## 2019-07-25 DIAGNOSIS — F411 Generalized anxiety disorder: Secondary | ICD-10-CM

## 2019-07-25 MED ORDER — BUPROPION HCL ER (XL) 300 MG PO TB24: 300.0000 mg | ORAL_TABLET | Freq: Every day | ORAL | 0 refills | Status: DC

## 2019-07-25 MED ORDER — AMITRIPTYLINE HCL 50 MG PO TABS: 50.0000 mg | ORAL_TABLET | Freq: Every day | ORAL | 0 refills | Status: DC

## 2019-07-25 NOTE — Progress Notes (Signed)
Virtual Visit via Telephone Note  I connected with Sonia Skinner on 07/25/19 at  4:20 PM EST by telephone and verified that I am speaking with the correct person using two identifiers.   I discussed the limitations, risks, security and privacy concerns of performing an evaluation and management service by telephone and the availability of in person appointments. I also discussed with the patient that there may be a patient responsible charge related to this service. The patient expressed understanding and agreed to proceed.   History of Present Illness: Patient was evaluated by phone session.  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  She reported that her assignment will and on January 15.  Overall she described the medicine working well.  She is taking amitriptyline and Wellbutrin.  She is sleeping better.  She is getting along with her sister very well.  She denies any irritability, mood swing, paranoia.  She has no tremors or shakes.  She would like to continue current medication.  Recently she had blood work and her hemoglobin A1c is dropped from 8.5-7.3.  She also lost weight since the last visit.   PastPsychiatric History:Reviewed. H/Otaking antidepressant on and off most of her life. [facility] given the Strattera, Adderall, Zoloft, Celexa, Lamictaland Lexapro.Never tested forADHD. No h/opsychosis, mania or hallucination. In 2001 she moved to [facility] and saw psychiatrist andgivenamitriptyline and Wellbutrinto stop smoking.   Recent Results (from the past 2160 hour(s))  POCT HgB A1C     Status: Abnormal   Collection Time: 06/20/19  2:36 PM  Result Value Ref Range   Hemoglobin A1C 7.3 (A) 4.0 - 5.6 %   HbA1c POC (<> result, manual entry)     HbA1c, POC (prediabetic range)     HbA1c, POC (controlled diabetic range)    TSH     Status:  Abnormal   Collection Time: 06/20/19  3:18 PM  Result Value Ref Range   TSH 4.73 (H) 0.35 - 4.50 uIU/mL  T4, Free     Status: None   Collection Time: 06/20/19  3:18 PM  Result Value Ref Range   Free T4 1.26 0.60 - 1.60 ng/dL    Comment: Specimens from patients who are undergoing biotin therapy and /or ingesting biotin supplements may contain high levels of biotin.  The higher biotin concentration in these specimens interferes with this Free T4 assay.  Specimens that contain high levels  of biotin may cause false high results for this Free T4 assay.  Please interpret results in light of the total clinical presentation of the patient.        Psychiatric Specialty Exam: Physical Exam  ROS  There were no vitals taken for this visit.There is no height or weight on file to calculate BMI.  General Appearance: NA  Eye Contact:  NA  Speech:  Clear and Coherent and Slow  Volume:  Normal  Mood:  Euthymic  Affect:  NA  Thought Process:  Goal Directed  Orientation:  Full (Time, Place, and Person)  Thought Content:  WDL  Suicidal Thoughts:  No  Homicidal Thoughts:  No  Memory:  Immediate;   Good Recent;   Good Remote;   Good  Judgement:  Intact  Insight:  Present  Psychomotor Activity:  NA  Concentration:  Concentration: Good and Attention Span: Good  Recall:  Good  Fund of Knowledge:  Good  Language:  Good  Akathisia:  No  Handed:  Right  AIMS (if indicated):     Assets:  Communication Skills Desire for Improvement Housing Resilience Social Support Talents/Skills  ADL's:  Intact  Cognition:  WNL  Sleep:   ok      Assessment and Plan: Major depressive disorder, recurrent.  Generalized anxiety disorder.  Patient is doing well on current dose of psychotropic medication.  She has no tremors, shakes or any EPS.  Continue Wellbutrin XL 300 g daily and amitriptyline 50 mg at bedtime.  Follow Up Instructions:    I discussed the assessment and treatment plan with the patient.  The patient was provided an opportunity to ask questions and all were answered. The patient agreed with the plan and demonstrated an understanding of the instructions.   The patient was advised to call back or seek an in-person evaluation if the symptoms worsen or if the condition fails to improve as anticipated.  I provided 20 minutes of non-face-to-face time during this encounter.   Kathlee Nations, MD

## 2019-08-05 ENCOUNTER — Other Ambulatory Visit (HOSPITAL_COMMUNITY): Payer: Self-pay | Admitting: Psychiatry

## 2019-08-05 ENCOUNTER — Other Ambulatory Visit: Payer: Self-pay | Admitting: Internal Medicine

## 2019-08-05 DIAGNOSIS — E118 Type 2 diabetes mellitus with unspecified complications: Secondary | ICD-10-CM

## 2019-08-05 DIAGNOSIS — E113519 Type 2 diabetes mellitus with proliferative diabetic retinopathy with macular edema, unspecified eye: Secondary | ICD-10-CM

## 2019-08-23 ENCOUNTER — Encounter: Payer: Self-pay | Admitting: *Deleted

## 2019-08-23 ENCOUNTER — Other Ambulatory Visit: Payer: Self-pay | Admitting: *Deleted

## 2019-08-23 NOTE — Patient Outreach (Signed)
[place] [facility]) Care Management  08/23/2019  Sonia Skinner [birth year] [id number]   [place] Other MonthOutreach  Referral Date:09/27/2018 Referral Source:Primary MD Reason for Referral:Poorly controlled diabetes Insurance:[employer] Medicare   Outreach Attempt:  Successful telephone outreach to patient for follow up.  HIPAA verified with patient.  Patient reporting she is doing fine.  Has not checked blood sugar yet today, but reports fasting ranges of 100-110's.  Denies any hypo or hyperglycemic episodes.  Does report she has not been taking meal coverage insulin to help prevent hypoglycemia at the direction of Endocrinologist.  Appointments:  Attended appointment with Dr. Loanne Drilling, Endocrinologist on 06/20/2019 and has follow up appointment on 09/21/2019.  Plan: RN Health Coach will make next telephone outreach to patient within the month of April and patient agrees to future outreach.  [place] [phone] [email or web address]

## 2019-08-31 ENCOUNTER — Other Ambulatory Visit: Payer: Self-pay | Admitting: Internal Medicine

## 2019-08-31 DIAGNOSIS — I63512 Cerebral infarction due to unspecified occlusion or stenosis of left middle cerebral artery: Secondary | ICD-10-CM

## 2019-08-31 DIAGNOSIS — E785 Hyperlipidemia, unspecified: Secondary | ICD-10-CM

## 2019-08-31 DIAGNOSIS — G43011 Migraine without aura, intractable, with status migrainosus: Secondary | ICD-10-CM

## 2019-08-31 DIAGNOSIS — I10 Essential (primary) hypertension: Secondary | ICD-10-CM

## 2019-09-17 ENCOUNTER — Other Ambulatory Visit: Payer: Self-pay

## 2019-09-17 NOTE — Patient Outreach (Signed)
[place] [facility]) Care Management  09/17/2019  Sonia Skinner [birth year] [id number]   Medication Adherence call to Sonia Skinner Compliant Voice message left with a call back number. Sonia Skinner is showing past due on Edarbyclor 40/12.5 mg under Al[place].   [place] Management Direct Dial [phone]  Fax [phone] [email or web address]

## 2019-09-21 ENCOUNTER — Ambulatory Visit: Payer: Medicare Other | Admitting: Endocrinology

## 2019-09-29 ENCOUNTER — Other Ambulatory Visit: Payer: Self-pay | Admitting: Internal Medicine

## 2019-09-29 DIAGNOSIS — E113519 Type 2 diabetes mellitus with proliferative diabetic retinopathy with macular edema, unspecified eye: Secondary | ICD-10-CM

## 2019-09-29 DIAGNOSIS — E118 Type 2 diabetes mellitus with unspecified complications: Secondary | ICD-10-CM

## 2019-09-29 DIAGNOSIS — Z794 Long term (current) use of insulin: Secondary | ICD-10-CM

## 2019-10-22 ENCOUNTER — Other Ambulatory Visit (HOSPITAL_COMMUNITY): Payer: Self-pay | Admitting: *Deleted

## 2019-10-22 DIAGNOSIS — F411 Generalized anxiety disorder: Secondary | ICD-10-CM

## 2019-10-22 DIAGNOSIS — F331 Major depressive disorder, recurrent, moderate: Secondary | ICD-10-CM

## 2019-10-22 MED ORDER — BUPROPION HCL ER (XL) 300 MG PO TB24: 300.0000 mg | ORAL_TABLET | Freq: Every day | ORAL | 0 refills | Status: DC

## 2019-10-24 ENCOUNTER — Other Ambulatory Visit: Payer: Self-pay

## 2019-10-24 ENCOUNTER — Encounter (HOSPITAL_COMMUNITY): Payer: Self-pay | Admitting: Psychiatry

## 2019-10-24 ENCOUNTER — Ambulatory Visit (INDEPENDENT_AMBULATORY_CARE_PROVIDER_SITE_OTHER): Payer: Medicare Other | Admitting: Psychiatry

## 2019-10-24 DIAGNOSIS — F411 Generalized anxiety disorder: Secondary | ICD-10-CM

## 2019-10-24 DIAGNOSIS — F331 Major depressive disorder, recurrent, moderate: Secondary | ICD-10-CM | POA: Diagnosis not present

## 2019-10-24 MED ORDER — AMITRIPTYLINE HCL 50 MG PO TABS: 50.0000 mg | ORAL_TABLET | Freq: Every day | ORAL | 0 refills | Status: DC

## 2019-10-24 MED ORDER — BUPROPION HCL ER (XL) 300 MG PO TB24: 300.0000 mg | ORAL_TABLET | Freq: Every day | ORAL | 0 refills | Status: DC

## 2019-10-24 NOTE — Progress Notes (Signed)
Virtual Visit via Telephone Note  I connected with Sonia Skinner on 10/24/19 at  4:00 PM EST by telephone and verified that I am speaking with the correct person using two identifiers.   I discussed the limitations, risks, security and privacy concerns of performing an evaluation and management service by telephone and the availability of in person appointments. I also discussed with the patient that there may be a patient responsible charge related to this service. The patient expressed understanding and agreed to proceed.   History of Present Illness: Patient was evaluated by phone session.  She reported things are going well.  She is working and job is going well.  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  She is sleeping good.  She does not want to change medication since things are going well.  She is getting along with her sister without any problem.  She denies any anger, mood swing, paranoia, crying spells or any feeling of hopelessness or worthlessness.  She is compliant with other medication for her diabetes.  She like to stay on her current psychotropic medication.  She denies any anxiety and does not feel overwhelmed.  Her energy level is good.  PastPsychiatric History:Reviewed. H/Otaking antidepressant on and off most of life. H/O inpatient at [facility] for overdose. Saw provider at [facility] and given the Strattera, Adderall, Zoloft, Celexa, Lamictaland Lexapro.Never tested forADHD. No h/opsychosis, mania or hallucination. In 2001 she moved to [facility] and saw psychiatrist andgivenamitriptyline and Wellbutrinto stop smoking.   Psychiatric Specialty Exam: Physical Exam  Review of Systems  There were no vitals taken for this visit.There is no height or weight on file to calculate BMI.  General Appearance: NA  Eye Contact:  NA  Speech:  Clear and Coherent and Slow  Volume:  Normal  Mood:   Euthymic  Affect:  NA  Thought Process:  Goal Directed  Orientation:  Full (Time, Place, and Person)  Thought Content:  WDL  Suicidal Thoughts:  No  Homicidal Thoughts:  No  Memory:  Immediate;   Good Recent;   Good Remote;   Good  Judgement:  Intact  Insight:  Present  Psychomotor Activity:  NA  Concentration:  Concentration: Good and Attention Span: Good  Recall:  Good  Fund of Knowledge:  Good  Language:  Good  Akathisia:  No  Handed:  Right  AIMS (if indicated):     Assets:  Communication Skills Desire for Improvement Housing Resilience Talents/Skills  ADL's:  Intact  Cognition:  WNL  Sleep:   ok      Assessment and Plan: Major depressive disorder, recurrent.  Generalized anxiety disorder.  Patient is a stable on her current dose of medication.  Continue amitriptyline 50 mg at bedtime and Wellbutrin XL 300 mg daily.  Recommended to call us back if she has any question or any concern.  Follow-up in 3 months.  Follow Up Instructions:    I discussed the assessment and treatment plan with the patient. The patient was provided an opportunity to ask questions and all were answered. The patient agreed with the plan and demonstrated an understanding of the instructions.   The patient was advised to call back or seek an in-person evaluation if the symptoms worsen or if the condition fails to improve as anticipated.  I provided 15 minutes of non-face-to-face time during this encounter.   Kathlee Nations, MD

## 2019-10-24 NOTE — Patient Outreach (Signed)
[facility]) Care Management  10/24/2019  Sonia Skinner 09-12-1950 [id number]   Medication Adherence call to [name] Compliant Voice message left with a call back number. [name] is showing past due on Edarbyclor 40/12.5 mg under [place].  [place] Management Direct Dial [phone]  Fax [phone] [name]

## 2019-10-29 ENCOUNTER — Other Ambulatory Visit: Payer: Self-pay | Admitting: Internal Medicine

## 2019-10-29 DIAGNOSIS — E113519 Type 2 diabetes mellitus with proliferative diabetic retinopathy with macular edema, unspecified eye: Secondary | ICD-10-CM

## 2019-10-29 DIAGNOSIS — E118 Type 2 diabetes mellitus with unspecified complications: Secondary | ICD-10-CM

## 2019-10-31 NOTE — Telephone Encounter (Signed)
Opened in error

## 2019-11-19 ENCOUNTER — Other Ambulatory Visit: Payer: Self-pay | Admitting: *Deleted

## 2019-11-19 ENCOUNTER — Encounter: Payer: Self-pay | Admitting: *Deleted

## 2019-11-19 NOTE — Patient Outreach (Addendum)
[facility]) Care Management  [place]  11/19/2019   EDLYN ROSENBURG 12-04-1950 [id number]   [place] Quarterly Outreach   Referral Date:  09/27/2018 Referral Source:  Primary MD Reason for Referral:  Poorly controlled diabetes Insurance:  [employer] Attempt:  Successful telephone outreach to patient for follow up.  HIPAA verified with patient.  Patient reporting she has received both doses of COVID vaccine without any adverse affects.  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  Does report she needs new batteries for her CBG meter, so she has not checked her blood sugar today.  Also reporting she sometimes forgets to take her medications.  Discussed setting alarm on her cell phone as a reminder.  Patient complaining of a sharp shooting left arm pain radiating down to her fingers, leaving residual numbness.  Does report she has not been taking her Gabapentin recently.  Discussed and encouraged medication compliance and encouraged patient to contact provider regarding arm/finger pain.  Patient also reporting speaking with nurse from [facility] for Disease Management.  Encounter Medications:  Outpatient Encounter Medications as of 11/19/2019  Medication Sig Note  . Insulin Glargine (BASAGLAR KWIKPEN) 100 UNIT/ML SOPN Inject 0.5 mLs (50 Units total) into the skin every morning. And pen needles 1/day   . amitriptyline (ELAVIL) 50 MG tablet Take 1 tablet (50 mg total) by mouth at bedtime.   Marland Kitchen aspirin 325 MG tablet Take 1 tablet (325 mg total) by mouth daily.   Marland Kitchen atorvastatin (LIPITOR) 40 MG tablet Take 1 tablet by mouth daily.   . Azilsartan-Chlorthalidone 40-12.5 MG TABS Take by mouth.   . blood glucose meter kit and supplies KIT Use to test blood sugars   . buPROPion (WELLBUTRIN XL) 300 MG 24 hr tablet Take 1 tablet (300 mg total) by mouth daily.   Marland Kitchen BYSTOLIC 5 MG tablet Take 1 tablet by mouth daily.   .  Cyanocobalamin (VITAMIN B-12 CR) 1500 MCG TBCR Take 1 tablet (1,500 mcg total) by mouth daily.   Marland Kitchen DEXILANT 60 MG capsule Take 1 capsule by mouth daily.   Marland Kitchen gabapentin (NEURONTIN) 100 MG capsule Take 1 capsule by mouth twice daily.  (Patient not taking: Reported on 11/19/2019) 11/19/2019: Reports not taking at this time, states she will resume  . glucose blood (ACCU-CHEK GUIDE) test strip Use to check blood sugars five times a day   . Insulin Lispro (HUMALOG KWIKPEN [place]) Inject 5 Units into the skin 3 (three) times daily with meals as needed. 08/23/2019: Reports no longer taking  . Insulin Pen Needle 32G X 4 MM MISC Use to administer insulin four times a day   . Lancets Thin MISC Use to check blood sugar five times daily. DX: E11.8   . levothyroxine (SYNTHROID) 112 MCG tablet Take 1 tablet (112 mcg total) by mouth daily before breakfast.   . loratadine (CLARITIN) 10 MG tablet Take 10 mg by mouth daily.   Marland Kitchen OZEMPIC, 1 MG/DOSE, 2 MG/1.5ML SOPN Inject 55m subcutaneously every Tuesday.   .Marland KitchenSYNJARDY 12-998 MG TABS Take 1 tablet by mouth twice daily.  **Office visit due**   . [DISCONTINUED] DEXILANT 60 MG capsule Take 1 capsule by mouth daily.   . [DISCONTINUED] SYNJARDY 12-998 MG TABS Take 1 tablet by mouth twice daily.  **Office visit due**    No facility-administered encounter medications on file as of 11/19/2019.    Functional Status:  In your present state  of health, do you have any difficulty performing the following activities: 04/06/2019  Hearing? N  Vision? N  Difficulty concentrating or making decisions? N  Walking or climbing stairs? N  Dressing or bathing? N  Doing errands, shopping? N  Preparing Food and eating ? N  Using the Toilet? N  In the past six months, have you accidently leaked urine? N  Do you have problems with loss of bowel control? N  Managing your Medications? N  Managing your Finances? N  Housekeeping or managing your Housekeeping? N  Some recent data might be hidden     Fall/Depression Screening: Fall Risk  11/19/2019 08/23/2019 06/27/2019  Falls in the past year? 0 0 0  Number falls in past yr: 0 - -  Injury with Fall? 0 - -  Risk for fall due to : Impaired vision;Medication side effect Medication side effect;Impaired vision Medication side effect;Impaired vision  Follow up Falls evaluation completed;Education provided;Falls prevention discussed Falls evaluation completed;Education provided;Falls prevention discussed Falls evaluation completed;Education provided;Falls prevention discussed   PHQ 2/9 Scores 04/06/2019 11/10/2018 09/27/2018 07/26/2018 06/21/2018 02/20/2018 06/29/2017  PHQ - 2 Score 1 0 0 _0 0  PHQ- 9 Score 4 - - - 17 14 -   THN CM Care Plan Problem One     Most Recent Value  Care Plan Problem One  Knowledge deficiet related to self care management of diabetes  Role Documenting the Problem One  [place] for Problem One  Active  THN Long Term Goal   Patient will maintain A1C of 7.5 or below within the next 90 days.  THN Long Term Goal Start Date  11/19/19  Interventions for Problem One Long Term Goal  Goals and care plan reviewed and discussed with patient, reviewed medications and indications and encouraged medication compliance, encouraged patient to schedule appointment with primary care provider, encouraged patient to discuss pain in arm/hand with provider, discussed importance of medication compliance with Gabapentin related to arm/hand nerve pain, encouraged patient to change batteries in CBG meter and to monitor blood sugars, discussed setting alarm on phone to help remember to take medications to help with medication compliance, sending 2021 Calendar Booklet to assist with documentation of blood sugars     Appointments:  Last appointment with primary care provider, Dr. Ronnald Ramp in February 2020, and encouraged patient to schedule follow up.  Last seen Endocrinologist, Dr. Loanne Drilling on 06/20/2019 and patient stating she is going to  schedule follow up as soon as possible.  Plan: RN Health Coach will send patient 2021 Calendar Booklet. RN Health Coach will send primary care provider quarterly update. RN Health Coach will make next telephone outreach to patient within the month of July and patient agrees to future outreach.  [place] [phone] [email or web address]

## 2019-11-28 ENCOUNTER — Other Ambulatory Visit: Payer: Self-pay | Admitting: Internal Medicine

## 2019-11-28 DIAGNOSIS — E118 Type 2 diabetes mellitus with unspecified complications: Secondary | ICD-10-CM

## 2019-11-28 DIAGNOSIS — I63512 Cerebral infarction due to unspecified occlusion or stenosis of left middle cerebral artery: Secondary | ICD-10-CM

## 2019-11-28 DIAGNOSIS — E113519 Type 2 diabetes mellitus with proliferative diabetic retinopathy with macular edema, unspecified eye: Secondary | ICD-10-CM

## 2019-11-28 DIAGNOSIS — E785 Hyperlipidemia, unspecified: Secondary | ICD-10-CM

## 2019-11-28 DIAGNOSIS — Z794 Long term (current) use of insulin: Secondary | ICD-10-CM

## 2019-11-28 DIAGNOSIS — G43011 Migraine without aura, intractable, with status migrainosus: Secondary | ICD-10-CM

## 2019-11-28 DIAGNOSIS — I10 Essential (primary) hypertension: Secondary | ICD-10-CM

## 2019-12-04 ENCOUNTER — Other Ambulatory Visit: Payer: Self-pay | Admitting: Internal Medicine

## 2019-12-04 ENCOUNTER — Other Ambulatory Visit: Payer: Self-pay | Admitting: Family Medicine

## 2019-12-04 DIAGNOSIS — Z1231 Encounter for screening mammogram for malignant neoplasm of breast: Secondary | ICD-10-CM

## 2019-12-25 ENCOUNTER — Ambulatory Visit (HOSPITAL_COMMUNITY): Payer: Medicare Other | Admitting: Psychiatry

## 2019-12-28 ENCOUNTER — Other Ambulatory Visit: Payer: Self-pay | Admitting: Internal Medicine

## 2020-01-24 ENCOUNTER — Ambulatory Visit (HOSPITAL_COMMUNITY): Payer: Medicare Other | Admitting: Psychiatry

## 2020-01-27 ENCOUNTER — Other Ambulatory Visit: Payer: Self-pay | Admitting: Internal Medicine

## 2020-01-27 ENCOUNTER — Other Ambulatory Visit (HOSPITAL_COMMUNITY): Payer: Self-pay | Admitting: Psychiatry

## 2020-01-27 DIAGNOSIS — F331 Major depressive disorder, recurrent, moderate: Secondary | ICD-10-CM

## 2020-01-27 DIAGNOSIS — E113519 Type 2 diabetes mellitus with proliferative diabetic retinopathy with macular edema, unspecified eye: Secondary | ICD-10-CM

## 2020-01-27 DIAGNOSIS — F411 Generalized anxiety disorder: Secondary | ICD-10-CM

## 2020-01-27 DIAGNOSIS — E118 Type 2 diabetes mellitus with unspecified complications: Secondary | ICD-10-CM

## 2020-01-31 ENCOUNTER — Telehealth (HOSPITAL_COMMUNITY): Payer: Self-pay

## 2020-01-31 NOTE — Telephone Encounter (Signed)
Received fax from pharmacy requesting a refill on patient's Bupropion XL 300mg . It was sent in 10/24/19 for #90 0 refills and she had an appointment scheduled for 01/24/20 but it shows cancelled. Please advise. Thank you.

## 2020-01-31 NOTE — Telephone Encounter (Signed)
She needs appointment

## 2020-02-01 ENCOUNTER — Other Ambulatory Visit: Payer: Self-pay | Admitting: Internal Medicine

## 2020-02-01 DIAGNOSIS — E118 Type 2 diabetes mellitus with unspecified complications: Secondary | ICD-10-CM

## 2020-02-06 ENCOUNTER — Encounter (HOSPITAL_COMMUNITY): Payer: Self-pay | Admitting: Psychiatry

## 2020-02-06 ENCOUNTER — Telehealth (INDEPENDENT_AMBULATORY_CARE_PROVIDER_SITE_OTHER): Payer: Medicare Other | Admitting: Psychiatry

## 2020-02-06 ENCOUNTER — Other Ambulatory Visit: Payer: Self-pay

## 2020-02-06 DIAGNOSIS — F331 Major depressive disorder, recurrent, moderate: Secondary | ICD-10-CM

## 2020-02-06 DIAGNOSIS — F411 Generalized anxiety disorder: Secondary | ICD-10-CM | POA: Diagnosis not present

## 2020-02-06 MED ORDER — BUPROPION HCL ER (XL) 300 MG PO TB24: 300.0000 mg | ORAL_TABLET | Freq: Every day | ORAL | 0 refills | Status: DC

## 2020-02-06 MED ORDER — AMITRIPTYLINE HCL 75 MG PO TABS: 75.0000 mg | ORAL_TABLET | Freq: Every day | ORAL | 0 refills | Status: DC

## 2020-02-06 NOTE — Progress Notes (Signed)
Virtual Visit via Telephone Note  I connected with Sonia Skinner on 02/06/20 at  2:40 PM EDT by telephone and verified that I am speaking with the correct person using two identifiers.  Location: Patient: visiting pharmacy Provider: Home location   I discussed the limitations, risks, security and privacy concerns of performing an evaluation and management service by telephone and the availability of in person appointments. I also discussed with the patient that there may be a patient responsible charge related to this service. The patient expressed understanding and agreed to proceed.   History of Present Illness: Patient is evaluated by phone session.  She is stressed because her husband recently fell and found unconscious and taken to the hospital.  Patient told he had a prostate cancer and now had radiation.  Patient told that she is overwhelmed but does not feel she need to see any therapist.  She had limited support because her husband's family is not as much supportive.  She does talk some time to her sister.  She endorsed sometimes sleep is an issue and she does have racing thoughts, anxiety and feeling overwhelmed.  She had not been eating well and lost few pounds since the last visit.  Patient told since has been back to home he is making small progress and she is feeling better.  She denies any crying spells or any feeling of hopelessness and denies any suicidal thoughts.  She has no tremor shakes or any EPS.  She is compliant with Wellbutrin and amitriptyline.  She also compliant with her diabetes medication.  Her energy level is fair.  She denies drinking or using any illegal substances.   PastPsychiatric History:Reviewed. H/Otaking antidepressant on and off most of life. H/O inpatient at [facility] for overdose. Saw provider at [facility] and given the Strattera, Adderall, Zoloft, Celexa, Lamictaland Lexapro.Never tested forADHD. No h/opsychosis, mania or hallucination.  In 2001 she moved to [facility] and saw psychiatrist andgivenamitriptyline and Wellbutrinto stop smoking.  Psychiatric Specialty Exam: Physical Exam  Review of Systems  Weight 125 lb (56.7 kg).There is no height or weight on file to calculate BMI.  General Appearance: NA  Eye Contact:  NA  Speech:  Slow  Volume:  Decreased  Mood:  Anxious and Dysphoric  Affect:  NA  Thought Process:  Goal Directed  Orientation:  Full (Time, Place, and Person)  Thought Content:  Rumination  Suicidal Thoughts:  No  Homicidal Thoughts:  No  Memory:  Immediate;   Good Recent;   Fair Remote;   Good  Judgement:  Good  Insight:  Present  Psychomotor Activity:  NA  Concentration:  Concentration: Fair and Attention Span: Fair  Recall:  Good  Fund of Knowledge:  Good  Language:  Good  Akathisia:  No  Handed:  Right  AIMS (if indicated):     Assets:  Communication Skills Desire for Improvement Housing Resilience  ADL's:  Intact  Cognition:  WNL  Sleep:   fair      Assessment and Plan: Major depressive disorder, recurrent.  Generalized anxiety disorder.  I recommend to try amitriptyline 75 mg to help her anxiety, sleep and may help her appetite.  I also offered therapy but patient declined.  She like to keep the Wellbutrin present dose.  She has no tremors shakes or any EPS.  I will continue Wellbutrin XL 300 mg daily and we will try amitriptyline 75 mg at bedtime.  I recommend to call us back if she feels worsening  of the symptom or if she has any concerns or side effects from the medication.  Follow-up in 3 months.  Follow Up Instructions:    I discussed the assessment and treatment plan with the patient. The patient was provided an opportunity to ask questions and all were answered. The patient agreed with the plan and demonstrated an understanding of the instructions.   The patient was advised to call back or seek an in-person evaluation if the symptoms worsen or if the condition  fails to improve as anticipated.  I provided 20 minutes of non-face-to-face time during this encounter.   Kathlee Nations, MD

## 2020-02-14 ENCOUNTER — Other Ambulatory Visit: Payer: Self-pay | Admitting: *Deleted

## 2020-02-14 NOTE — Patient Outreach (Signed)
[place] [facility]) Care Management  02/14/2020  Sonia Skinner 1951-06-15 [id number]   [place]  Referral Date:09/27/2018 Referral Source:Primary MD Reason for Referral:Poorly controlled diabetes Insurance:[employer] Medicare   Outreach Attempt:  Outreach attempt #1 to patient for quarterly follow up.  Patient answered and stated she was at laundry mat and could not speak at this time; requested telephone call back.   Plan:  RN Health Coach will make another outreach attempt within the month of July per patients request.  Hubert Azure RN [place] [phone] [email or web address]

## 2020-02-26 ENCOUNTER — Other Ambulatory Visit: Payer: Self-pay | Admitting: Internal Medicine

## 2020-02-26 DIAGNOSIS — I63512 Cerebral infarction due to unspecified occlusion or stenosis of left middle cerebral artery: Secondary | ICD-10-CM

## 2020-02-26 DIAGNOSIS — E785 Hyperlipidemia, unspecified: Secondary | ICD-10-CM

## 2020-02-26 DIAGNOSIS — G43011 Migraine without aura, intractable, with status migrainosus: Secondary | ICD-10-CM

## 2020-02-26 DIAGNOSIS — I10 Essential (primary) hypertension: Secondary | ICD-10-CM

## 2020-03-01 ENCOUNTER — Other Ambulatory Visit: Payer: Self-pay | Admitting: Internal Medicine

## 2020-03-01 DIAGNOSIS — E118 Type 2 diabetes mellitus with unspecified complications: Secondary | ICD-10-CM

## 2020-03-12 ENCOUNTER — Other Ambulatory Visit: Payer: Self-pay | Admitting: *Deleted

## 2020-03-12 NOTE — Patient Outreach (Signed)
[place]) Care Management  03/12/2020  Sonia Skinner 05-20-1951 [id number]   [place]  Referral Date:09/27/2018 Referral Source:Primary MD Reason for Referral:Poorly controlled diabetes Insurance:[employer] Medicare   Outreach Attempt: Outreach attempt #2 to patient for follow up. No answer. RN Health Coach left HIPAA compliant voicemail message along with contact information.  Plan:  RN Health Coach will make another outreach attempt within the month of August if no return call back from patient.   [place] [phone] [email or web address]

## 2020-03-17 ENCOUNTER — Encounter: Payer: Self-pay | Admitting: Internal Medicine

## 2020-03-17 ENCOUNTER — Other Ambulatory Visit: Payer: Self-pay | Admitting: Internal Medicine

## 2020-03-17 MED ORDER — DEXILANT 60 MG PO CPDR: 1.0000 | DELAYED_RELEASE_CAPSULE | Freq: Every day | ORAL | 0 refills | Status: DC

## 2020-03-25 ENCOUNTER — Telehealth: Payer: Self-pay | Admitting: Internal Medicine

## 2020-03-25 DIAGNOSIS — Z794 Long term (current) use of insulin: Secondary | ICD-10-CM

## 2020-03-25 DIAGNOSIS — I63512 Cerebral infarction due to unspecified occlusion or stenosis of left middle cerebral artery: Secondary | ICD-10-CM

## 2020-03-25 DIAGNOSIS — G43011 Migraine without aura, intractable, with status migrainosus: Secondary | ICD-10-CM

## 2020-03-25 DIAGNOSIS — I10 Essential (primary) hypertension: Secondary | ICD-10-CM

## 2020-03-25 DIAGNOSIS — E118 Type 2 diabetes mellitus with unspecified complications: Secondary | ICD-10-CM

## 2020-03-25 DIAGNOSIS — E785 Hyperlipidemia, unspecified: Secondary | ICD-10-CM

## 2020-03-25 NOTE — Telephone Encounter (Signed)
New message:    1.Medication Requested: OZEMPIC, 1 MG/DOSE, 2 MG/1.5ML SOPN atorvastatin (LIPITOR) 40 MG tablet gabapentin (NEURONTIN) 100 MG capsule BYSTOLIC 5 MG tablet 2. Pharmacy (Name, [place]): [facility] [place] 3. On Med List: yes  4. Last Visit with PCP:   5. Next visit date with PCP:   Agent: Please be advised that RX refills may take up to 3 business days. We ask that you follow-up with your pharmacy.

## 2020-03-26 MED ORDER — NEBIVOLOL HCL 5 MG PO TABS: 5.0000 mg | ORAL_TABLET | Freq: Every day | ORAL | 0 refills | Status: DC

## 2020-03-26 NOTE — Telephone Encounter (Signed)
Pt read mychart message that stated that she can not have refills until she has an appointment. Pt contacted and an appt has been scheduled. Refills will be sent in at that time.   I have sent in the blood pressure medication.

## 2020-04-01 ENCOUNTER — Ambulatory Visit (INDEPENDENT_AMBULATORY_CARE_PROVIDER_SITE_OTHER): Payer: Medicare Other | Admitting: Internal Medicine

## 2020-04-01 ENCOUNTER — Other Ambulatory Visit: Payer: Self-pay

## 2020-04-01 ENCOUNTER — Encounter: Payer: Self-pay | Admitting: Internal Medicine

## 2020-04-01 VITALS — BP 158/66 | HR 77 | Temp 97.8°F | Resp 16 | Ht 62.0 in | Wt 129.0 lb

## 2020-04-01 DIAGNOSIS — Z515 Encounter for palliative care: Secondary | ICD-10-CM | POA: Insufficient documentation

## 2020-04-01 DIAGNOSIS — E785 Hyperlipidemia, unspecified: Secondary | ICD-10-CM

## 2020-04-01 DIAGNOSIS — B370 Candidal stomatitis: Secondary | ICD-10-CM

## 2020-04-01 DIAGNOSIS — E118 Type 2 diabetes mellitus with unspecified complications: Secondary | ICD-10-CM

## 2020-04-01 DIAGNOSIS — Z0000 Encounter for general adult medical examination without abnormal findings: Secondary | ICD-10-CM | POA: Diagnosis not present

## 2020-04-01 DIAGNOSIS — E039 Hypothyroidism, unspecified: Secondary | ICD-10-CM

## 2020-04-01 DIAGNOSIS — K2100 Gastro-esophageal reflux disease with esophagitis, without bleeding: Secondary | ICD-10-CM

## 2020-04-01 DIAGNOSIS — E559 Vitamin D deficiency, unspecified: Secondary | ICD-10-CM | POA: Diagnosis not present

## 2020-04-01 DIAGNOSIS — I10 Essential (primary) hypertension: Secondary | ICD-10-CM | POA: Diagnosis not present

## 2020-04-01 DIAGNOSIS — Z1231 Encounter for screening mammogram for malignant neoplasm of breast: Secondary | ICD-10-CM

## 2020-04-01 DIAGNOSIS — N1832 Chronic kidney disease, stage 3b: Secondary | ICD-10-CM

## 2020-04-01 DIAGNOSIS — Z794 Long term (current) use of insulin: Secondary | ICD-10-CM

## 2020-04-01 DIAGNOSIS — I63512 Cerebral infarction due to unspecified occlusion or stenosis of left middle cerebral artery: Secondary | ICD-10-CM

## 2020-04-01 MED ORDER — CLOTRIMAZOLE 10 MG MT TROC: 10.0000 mg | Freq: Three times a day (TID) | OROMUCOSAL | 1 refills | Status: DC

## 2020-04-01 MED ORDER — AZILSARTAN-CHLORTHALIDONE 40-12.5 MG PO TABS: 1.0000 | ORAL_TABLET | Freq: Every day | ORAL | 0 refills | Status: DC

## 2020-04-01 NOTE — Patient Instructions (Signed)

## 2020-04-01 NOTE — Progress Notes (Signed)
Subjective:  Patient ID: Sonia Skinner, female    DOB: [birth year]  Age: 69 y.o. MRN: [id number]  CC: Annual Exam, Hypertension, Hyperlipidemia, Gastroesophageal Reflux, Diabetes, and Hypothyroidism  This visit occurred during the SARS-CoV-2 public health emergency.  Safety protocols were in place, including screening questions prior to the visit, additional usage of staff PPE, and extensive cleaning of exam room while observing appropriate contact time as indicated for disinfecting solutions.    HPI MYKIA HOLTON presents for a CPX.  She does not monitor her blood pressure.  She has had a few headaches recently but she denies blurred vision.  It sounds like the only antihypertensive she is taking is nebivolol.  She is active and denies any recent episodes of chest pain, shortness of breath, palpitations, edema, or fatigue.  She does not monitor her blood sugar.  She denies any recent polys.  It looks like she is no longer taking Metformin or the SGLT2 inhibitor.  Outpatient Medications Prior to Visit  Medication Sig Dispense Refill  . Accu-Chek FastClix Lancets MISC Use to check blood sugar five times daily. 510 each 2  . amitriptyline (ELAVIL) 75 MG tablet Take 1 tablet (75 mg total) by mouth at bedtime. 90 tablet 0  . aspirin 325 MG tablet Take 1 tablet (325 mg total) by mouth daily.    . blood glucose meter kit and supplies KIT Use to test blood sugars 1 each 0  . buPROPion (WELLBUTRIN XL) 300 MG 24 hr tablet Take 1 tablet (300 mg total) by mouth daily. 90 tablet 0  . Cyanocobalamin (VITAMIN B-12 CR) 1500 MCG TBCR Take 1 tablet (1,500 mcg total) by mouth daily. 90 tablet 1  . dexlansoprazole (DEXILANT) 60 MG capsule Take 1 capsule (60 mg total) by mouth daily. 90 capsule 0  . gabapentin (NEURONTIN) 100 MG capsule Take 1 capsule by mouth twice daily.  180 capsule 0  . glucose blood (ACCU-CHEK GUIDE) test strip Use to check blood sugars five times daily. 500 each 2  . levothyroxine (SYNTHROID)  112 MCG tablet Take 1 tablet (112 mcg total) by mouth daily before breakfast. 90 tablet 3  . loratadine (CLARITIN) 10 MG tablet Take 10 mg by mouth daily.    . nebivolol (BYSTOLIC) 5 MG tablet Take 1 tablet (5 mg total) by mouth daily. 30 tablet 0  . atorvastatin (LIPITOR) 40 MG tablet Take 1 tablet by mouth daily. 90 tablet 0  . Azilsartan-Chlorthalidone 40-12.5 MG TABS Take by mouth.    . Empagliflozin-metFORMIN HCl (SYNJARDY) 12-998 MG TABS Take 1 tablet by mouth in the morning and at bedtime. 60 tablet 1  . Insulin Glargine (BASAGLAR KWIKPEN) 100 UNIT/ML SOPN Inject 0.5 mLs (50 Units total) into the skin every morning. And pen needles 1/day 45 mL 3  . Insulin Lispro (HUMALOG KWIKPEN [place]) Inject 5 Units into the skin 3 (three) times daily with meals as needed.    . Insulin Pen Needle 32G X 4 MM MISC Use to administer insulin four times a day 360 each 3  . OZEMPIC, 1 MG/DOSE, 2 MG/1.5ML SOPN Inject '1mg'$  subcutaneously every Tuesday. 9 mL 0   No facility-administered medications prior to visit.    ROS Review of Systems  Constitutional: Negative.  Negative for appetite change, chills, diaphoresis, fatigue and fever.  HENT: Negative.  Negative for congestion, facial swelling, sinus pressure, sore throat, trouble swallowing and voice change.        She complains of a painless white coating on  the top of her tongue.  Eyes: Negative for visual disturbance.  Respiratory: Negative for chest tightness, shortness of breath and wheezing.   Cardiovascular: Negative for chest pain, palpitations and leg swelling.  Gastrointestinal: Negative for abdominal pain, constipation, diarrhea, nausea and vomiting.  Endocrine: Negative.  Negative for cold intolerance, heat intolerance, polydipsia, polyphagia and polyuria.  Genitourinary: Negative.  Negative for decreased urine volume, difficulty urinating, dysuria, hematuria and urgency.  Musculoskeletal: Negative for arthralgias and myalgias.  Skin: Negative for  color change, pallor and rash.  Neurological: Positive for headaches. Negative for dizziness, weakness, light-headedness and numbness.  Hematological: Negative for adenopathy. Does not bruise/bleed easily.  Psychiatric/Behavioral: Negative.     Objective:  BP (!) 158/66 (BP Location: Left Arm, Patient Position: Sitting, Cuff Size: Normal)   Pulse 77   Temp 97.8 F (36.6 C) (Oral)   Resp 16   Ht '5\' 2"'$  (1.575 m)   Wt 129 lb (58.5 kg)   SpO2 97%   BMI 23.59 kg/m   BP Readings from Last 3 Encounters:  04/01/20 (!) 158/66  06/20/19 (!) 152/60  04/06/19 136/78    Wt Readings from Last 3 Encounters:  04/01/20 129 lb (58.5 kg)  06/20/19 131 lb 3.2 oz (59.5 kg)  04/06/19 129 lb (58.5 kg)    Physical Exam Vitals reviewed.  Constitutional:      Appearance: Normal appearance.  HENT:     Mouth/Throat:     Lips: Pink.     Mouth: Mucous membranes are moist.     Pharynx: Oropharyngeal exudate present. No posterior oropharyngeal erythema.     Comments: ++ white exudate on top of tongue Eyes:     General: No scleral icterus.    Conjunctiva/sclera: Conjunctivae normal.  Cardiovascular:     Rate and Rhythm: Normal rate and regular rhythm.     Heart sounds: No murmur heard.      Comments: EKG- NSR, 76 bpm ?LAE No LVH No Q waves Pulmonary:     Effort: Pulmonary effort is normal.     Breath sounds: No stridor. No wheezing, rhonchi or rales.  Abdominal:     Palpations: There is no mass.     Tenderness: There is no abdominal tenderness. There is no guarding.  Musculoskeletal:        General: Normal range of motion.     Cervical back: Neck supple.     Right lower leg: No edema.     Left lower leg: No edema.  Lymphadenopathy:     Cervical: No cervical adenopathy.  Skin:    General: Skin is warm and dry.     Coloration: Skin is not pale.  Neurological:     General: No focal deficit present.     Mental Status: She is alert and oriented to person, place, and time. Mental  status is at baseline.  Psychiatric:        Mood and Affect: Mood normal.        Behavior: Behavior normal.     Lab Results  Component Value Date   WBC 11.9 (H) 04/01/2020   HGB 11.3 (L) 04/01/2020   HCT 36.0 04/01/2020   PLT 202 04/01/2020   GLUCOSE 122 (H) 04/01/2020   CHOL 181 04/01/2020   TRIG 118 04/01/2020   HDL 63 04/01/2020   LDLDIRECT 141 (H) 03/04/2010   LDLCALC 97 04/01/2020   ALT 34 (H) 04/01/2020   AST 39 (H) 04/01/2020   NA 142 04/01/2020   K 3.9 04/01/2020  CL 108 04/01/2020   CREATININE 1.22 (H) 04/01/2020   BUN 10 04/01/2020   CO2 29 04/01/2020   TSH 4.06 04/01/2020   INR 1.10 06/12/2015   HGBA1C 10.2 (H) 04/01/2020   MICROALBUR 0.6 04/01/2020    US THYROID  Result Date: 07/19/2019 CLINICAL DATA:  Left nodule. EXAM: THYROID ULTRASOUND TECHNIQUE: Ultrasound examination of the thyroid gland and adjacent soft tissues was performed. COMPARISON:  05/28/2016 FINDINGS: Parenchymal Echotexture: Moderately heterogenous Isthmus: 0.2 cm thickness, stable Right lobe: 4.9 x 1.4 x 1.7 cm, previously 4.5 x 1.2 x 1.7 Left lobe: 5.3 x 2 x 1.9 cm, previously 4.4 x 1.5 x 1.8 _________________________________________________________ Estimated total number of nodules >/= 1 cm: 2 Number of spongiform nodules >/=  2 cm not described below (TR1): 0 Number of mixed cystic and solid nodules >/= 1.5 cm not described below ([place]): 0 _________________________________________________________ Nodule # 1: Prior biopsy: No Location: Right; Mid Maximum size: 1.2 cm; Other 2 dimensions: 1 x 0.9 cm, previously, 1.2 x 1.1 x 0.9 cm Composition: solid/almost completely solid (2) Echogenicity: isoechoic (1) Shape: taller-than-wide (3) Margins: ill-defined (0) Echogenic foci: macrocalcifications (1) ACR TI-RADS total points: 7. ACR TI-RADS risk category:  TR5 (>/= 7 points). Significant change in size (>/= 20% in two dimensions and minimal increase of 2 mm): No Change in features: Yes Change in ACR  TI-RADS risk category: Yes ACR TI-RADS recommendations: **Given size (>/= 1.0 cm) and appearance, fine needle aspiration of this highly suspicious nodule should be considered based on TI-RADS criteria. _________________________________________________________ 0.9 cm hypoechoic nodule without calcifications, inferior right, previously 1 cm; This nodule does NOT meet TI-RADS criteria for biopsy or dedicated follow-up. Nodule # 3: Prior biopsy: No Location: Left; Mid Maximum size: 2.5 cm; Other 2 dimensions: 1.7 x 1.6 cm, previously, 2.5 x 1.8 x 1.4 cm Composition: solid/almost completely solid (2) Echogenicity: isoechoic (1) Shape: not taller-than-wide (0) Margins: ill-defined (0) Echogenic foci: none (0) ACR TI-RADS total points: 3. ACR TI-RADS risk category:  TR3 (3 points). Significant change in size (>/= 20% in two dimensions and minimal increase of 2 mm): No Change in features: No Change in ACR TI-RADS risk category: Yes ACR TI-RADS recommendations: **Given size (>/= 2.5 cm) and appearance, fine needle aspiration of this mildly suspicious nodule should be considered based on TI-RADS criteria. IMPRESSION: 1. Mild thyromegaly with bilateral nodules. 2. Recommend FNA biopsy of suspicious 1.2 cm mid right nodule AND mildly suspicious 2.5 cm mid left nodule. The above is in keeping with the ACR TI-RADS recommendations - J Am Coll Radiol 2017;14:587-595. Electronically Signed   By: Lucrezia Europe M.D.   On: 07/19/2019 16:29    Assessment & Plan:   Aldean was seen today for annual exam, hypertension, hyperlipidemia, gastroesophageal reflux, diabetes and hypothyroidism.  Diagnoses and all orders for this visit:  Essential hypertension, benign- Her blood pressure is not adequately well controlled and she is symptomatic.  I recommended she stay on the current dose of nebivolol and to restart the ARB and thiazide diuretic.  Her EKG is negative for LVH or ischemia.  Her labs are negative for secondary causes or endorgan  damage. -     CBC with Differential/Platelet; Future -     BASIC METABOLIC PANEL WITH GFR; Future -     Urinalysis, Routine w reflex microscopic; Future -     Azilsartan-Chlorthalidone 40-12.5 MG TABS; Take 1 tablet by mouth daily. -     EKG 12-Lead -     Urinalysis, Routine w  reflex microscopic -     BASIC METABOLIC PANEL WITH GFR -     CBC with Differential/Platelet  Gastroesophageal reflux disease with esophagitis without hemorrhage- Her symptoms are well controlled with the PPI.  No complications noted. -     CBC with Differential/Platelet; Future -     CBC with Differential/Platelet  Acquired hypothyroidism- Her TSH is in the normal range.  She will stay on the current dose of levothyroxine. -     TSH; Future -     TSH  Type II diabetes mellitus with manifestations ([place])- Her blood sugars are too high.  I do not think it safe for her to restart an SGLT2 inhibitor at this time.  I have asked her to start using basal and bolus insulin.  I recommended that she take Metformin and use a GLP-1 agonist. -     BASIC METABOLIC PANEL WITH GFR; Future -     Microalbumin / creatinine urine ratio; Future -     Hemoglobin A1c; Future -     Azilsartan-Chlorthalidone 40-12.5 MG TABS; Take 1 tablet by mouth daily. -     Ambulatory referral to Ophthalmology -     Hemoglobin A1c -     Microalbumin / creatinine urine ratio -     BASIC METABOLIC PANEL WITH GFR -     Consult to [facility] -     insulin glargine, 1 Unit Dial, (TOUJEO SOLOSTAR) 300 UNIT/ML Solostar Pen; Inject 50 Units into the skin every morning. And pen needles 1/day -     Semaglutide, 1 MG/DOSE, (OZEMPIC, 1 MG/DOSE,) 2 MG/1.5ML SOPN; Inject 0.1875 mLs (0.25 mg total) into the skin every morning. -     metFORMIN (GLUCOPHAGE XR) 750 MG 24 hr tablet; Take 2 tablets (1,500 mg total) by mouth daily with breakfast. -     Amb Referral to Nutrition and Diabetic E -     Insulin Pen Needle 32G X 4 MM MISC; Use to administer insulin  four times a day -     insulin lispro (HUMALOG KWIKPEN) 200 UNIT/ML KwikPen; Inject 10 Units into the skin 3 (three) times daily with meals as needed.  Hyperlipidemia with target LDL less than 70- She has not achieved her LDL goal.  I have asked her to restart the statin. -     Lipid panel; Future -     Hepatic function panel; Future -     Hepatic function panel -     Lipid panel -     atorvastatin (LIPITOR) 40 MG tablet; Take 1 tablet (40 mg total) by mouth daily.  Vitamin D deficiency- Her Vit D level is normal now. -     VITAMIN D 25 Hydroxy (Vit-D Deficiency, Fractures); Future -     VITAMIN D 25 Hydroxy (Vit-D Deficiency, Fractures)  Routine general medical examination at a health care facility- Exam completed, labs reviewed, vaccines reviewed and updated, cancer screenings were addressed, patient education material was given.  Visit for screening mammogram -     MM DIGITAL SCREENING BILATERAL; Future  Candidiasis, mouth -     clotrimazole (MYCELEX) 10 MG troche; Take 1 tablet (10 mg total) by mouth 3 (three) times daily.  Type 2 diabetes mellitus with complication, with long-term current use of insulin (HCC) -     insulin glargine, 1 Unit Dial, (TOUJEO SOLOSTAR) 300 UNIT/ML Solostar Pen; Inject 50 Units into the skin every morning. And pen needles 1/day -     Semaglutide, 1  MG/DOSE, (OZEMPIC, 1 MG/DOSE,) 2 MG/1.5ML SOPN; Inject 0.1875 mLs (0.25 mg total) into the skin every morning. -     metFORMIN (GLUCOPHAGE XR) 750 MG 24 hr tablet; Take 2 tablets (1,500 mg total) by mouth daily with breakfast. -     Insulin Pen Needle 32G X 4 MM MISC; Use to administer insulin four times a day -     insulin lispro (HUMALOG KWIKPEN) 200 UNIT/ML KwikPen; Inject 10 Units into the skin 3 (three) times daily with meals as needed.  Cerebrovascular accident (CVA) due to stenosis of left middle cerebral artery (HCC)- Will continue to work on risk factor modifications to reduce the recurrence of  stroke. -     atorvastatin (LIPITOR) 40 MG tablet; Take 1 tablet (40 mg total) by mouth daily.  Stage 3b chronic kidney disease- Her renal function has declined some.  She will avoid nephrotoxic agents.  I will order to get better control of her blood pressure and her blood sugar.   I have discontinued Don Perking. [place]'s Synjardy. I have changed her Basaglar KwikPen to [employer] and Insulin Lispro (HUMALOG KWIKPEN [place]) to insulin lispro (HUMALOG KWIKPEN) 200 UNIT/ML KwikPen. I have also changed her Azilsartan-Chlorthalidone, Ozempic (1 MG/DOSE), and atorvastatin. Additionally, I am having her start on clotrimazole and metFORMIN. Lastly, I am having her maintain her aspirin, Vitamin B-12 CR, loratadine, blood glucose meter kit and supplies, levothyroxine, gabapentin, Accu-Chek Guide, Accu-Chek FastClix Lancets, buPROPion, amitriptyline, Dexilant, nebivolol, and Insulin Pen Needle.  Meds ordered this encounter  Medications  . Azilsartan-Chlorthalidone 40-12.5 MG TABS    Sig: Take 1 tablet by mouth daily.    Dispense:  90 tablet    Refill:  0  . clotrimazole (MYCELEX) 10 MG troche    Sig: Take 1 tablet (10 mg total) by mouth 3 (three) times daily.    Dispense:  140 [place]    Refill:  1  . insulin glargine, 1 Unit Dial, (TOUJEO SOLOSTAR) 300 UNIT/ML Solostar Pen    Sig: Inject 50 Units into the skin every morning. And pen needles 1/day    Dispense:  9 mL    Refill:  1  . Semaglutide, 1 MG/DOSE, (OZEMPIC, 1 MG/DOSE,) 2 MG/1.5ML SOPN    Sig: Inject 0.1875 mLs (0.25 mg total) into the skin every morning.    Dispense:  9 mL    Refill:  0  . metFORMIN (GLUCOPHAGE XR) 750 MG 24 hr tablet    Sig: Take 2 tablets (1,500 mg total) by mouth daily with breakfast.    Dispense:  180 tablet    Refill:  1  . atorvastatin (LIPITOR) 40 MG tablet    Sig: Take 1 tablet (40 mg total) by mouth daily.    Dispense:  90 tablet    Refill:  1  . Insulin Pen Needle 32G X 4 MM MISC    Sig: Use to administer  insulin four times a day    Dispense:  360 each    Refill:  3    Dx E11.9     May substitute with any size available.  . insulin lispro (HUMALOG KWIKPEN) 200 UNIT/ML KwikPen    Sig: Inject 10 Units into the skin 3 (three) times daily with meals as needed.    Dispense:  9 mL    Refill:  1   In addition to time spent on CPE, I spent 60 minutes in preparing to see the patient by review of recent labs, imaging and procedures, obtaining and  reviewing separately obtained history, communicating with the patient and family or caregiver, ordering medications, tests or procedures, and documenting clinical information in the EHR including the differential Dx, treatment, and any further evaluation and other management of 1. Essential hypertension, benign 2. Gastroesophageal reflux disease with esophagitis without hemorrhage 3. Acquired hypothyroidism 4. Type II diabetes mellitus with manifestations ([place]) 5. Hyperlipidemia with target LDL less than 70 6. Vitamin D deficiency 7. Candidiasis, mouth 8. Type 2 diabetes mellitus with complication, with long-term current use of insulin ([place]) 9. Cerebrovascular accident (CVA) due to stenosis of left middle cerebral artery ([place]) 10. Stage 3b chronic kidney disease      Follow-up: Return in about 3 months (around 07/02/2020).  Scarlette Calico, MD

## 2020-04-02 ENCOUNTER — Encounter: Payer: Self-pay | Admitting: Internal Medicine

## 2020-04-02 DIAGNOSIS — N1832 Chronic kidney disease, stage 3b: Secondary | ICD-10-CM | POA: Insufficient documentation

## 2020-04-02 LAB — CBC WITH DIFFERENTIAL/PLATELET
Absolute Monocytes: 857 cells/uL (ref 200–950)
Basophils Absolute: 95 cells/uL (ref 0–200)
Basophils Relative: 0.8 %
Eosinophils Absolute: 298 cells/uL (ref 15–500)
Eosinophils Relative: 2.5 %
HCT: 36 % (ref 35.0–45.0)
Hemoglobin: 11.3 g/dL — ABNORMAL LOW (ref 11.7–15.5)
Lymphs Abs: 3856 cells/uL (ref 850–3900)
MCH: 27.8 pg (ref 27.0–33.0)
MCHC: 31.4 g/dL — ABNORMAL LOW (ref 32.0–36.0)
MCV: 88.7 fL (ref 80.0–100.0)
MPV: 12.1 fL (ref 7.5–12.5)
Monocytes Relative: 7.2 %
Neutro Abs: 6795 cells/uL (ref 1500–7800)
Neutrophils Relative %: 57.1 %
Platelets: 202 10*3/uL (ref 140–400)
RBC: 4.06 10*6/uL (ref 3.80–5.10)
RDW: 13.2 % (ref 11.0–15.0)
Total Lymphocyte: 32.4 %
WBC: 11.9 10*3/uL — ABNORMAL HIGH (ref 3.8–10.8)

## 2020-04-02 LAB — BASIC METABOLIC PANEL WITH GFR
BUN/Creatinine Ratio: 8 (calc) (ref 6–22)
BUN: 10 mg/dL (ref 7–25)
CO2: 29 mmol/L (ref 20–32)
Calcium: 9 mg/dL (ref 8.6–10.4)
Chloride: 108 mmol/L (ref 98–110)
Creat: 1.22 mg/dL — ABNORMAL HIGH (ref 0.60–0.93)
GFR, Est African American: 52 mL/min/{1.73_m2} — ABNORMAL LOW (ref >=60)
GFR, Est Non African American: 45 mL/min/{1.73_m2} — ABNORMAL LOW (ref >=60)
Glucose, Bld: 122 mg/dL — ABNORMAL HIGH (ref 65–99)
Potassium: 3.9 mmol/L (ref 3.5–5.3)
Sodium: 142 mmol/L (ref 135–146)

## 2020-04-02 LAB — URINALYSIS, ROUTINE W REFLEX MICROSCOPIC
Bilirubin Urine: NEGATIVE
Hgb urine dipstick: NEGATIVE
Ketones, ur: NEGATIVE
Leukocytes,Ua: NEGATIVE
Nitrite: NEGATIVE
Protein, ur: NEGATIVE
Specific Gravity, Urine: 1.028 (ref 1.001–1.03)
pH: 5.5 (ref 5.0–8.0)

## 2020-04-02 LAB — HEPATIC FUNCTION PANEL
AG Ratio: 2 (calc) (ref 1.0–2.5)
ALT: 34 U/L — ABNORMAL HIGH (ref 6–29)
AST: 39 U/L — ABNORMAL HIGH (ref 10–35)
Albumin: 4.1 g/dL (ref 3.6–5.1)
Alkaline phosphatase (APISO): 120 U/L (ref 37–153)
Bilirubin, Direct: 0 mg/dL (ref 0.0–0.2)
Globulin: 2.1 g/dL (calc) (ref 1.9–3.7)
Indirect Bilirubin: 0.3 mg/dL (calc) (ref 0.2–1.2)
Total Bilirubin: 0.3 mg/dL (ref 0.2–1.2)
Total Protein: 6.2 g/dL (ref 6.1–8.1)

## 2020-04-02 LAB — HEMOGLOBIN A1C
Hgb A1c MFr Bld: 10.2 % of total Hgb — ABNORMAL HIGH (ref <5.7)
Mean Plasma Glucose: 246 (calc)
eAG (mmol/L): 13.6 (calc)

## 2020-04-02 LAB — LIPID PANEL
Cholesterol: 181 mg/dL (ref <200)
HDL: 63 mg/dL (ref >=50)
LDL Cholesterol (Calc): 97 mg/dL (calc)
Non-HDL Cholesterol (Calc): 118 mg/dL (calc) (ref <130)
Total CHOL/HDL Ratio: 2.9 (calc) (ref <5.0)
Triglycerides: 118 mg/dL (ref <150)

## 2020-04-02 LAB — TSH: TSH: 4.06 mIU/L (ref 0.40–4.50)

## 2020-04-02 LAB — MICROALBUMIN / CREATININE URINE RATIO
Creatinine, Urine: 92 mg/dL (ref 20–275)
Microalb Creat Ratio: 7 mcg/mg creat (ref <30)
Microalb, Ur: 0.6 mg/dL

## 2020-04-02 LAB — VITAMIN D 25 HYDROXY (VIT D DEFICIENCY, FRACTURES): Vit D, 25-Hydroxy: 31 ng/mL (ref 30–100)

## 2020-04-02 MED ORDER — INSULIN PEN NEEDLE 32G X 4 MM MISC: 3 refills | Status: DC

## 2020-04-02 MED ORDER — HUMALOG KWIKPEN 200 UNIT/ML ~~LOC~~ SOPN: 10.0000 [IU] | PEN_INJECTOR | Freq: Three times a day (TID) | SUBCUTANEOUS | 1 refills | Status: DC | PRN

## 2020-04-02 MED ORDER — TOUJEO SOLOSTAR 300 UNIT/ML ~~LOC~~ SOPN: 50.0000 [IU] | PEN_INJECTOR | SUBCUTANEOUS | 1 refills | Status: DC

## 2020-04-02 MED ORDER — OZEMPIC (1 MG/DOSE) 2 MG/1.5ML ~~LOC~~ SOPN: 0.2500 mg | PEN_INJECTOR | Freq: Every morning | SUBCUTANEOUS | 0 refills | Status: DC

## 2020-04-02 MED ORDER — METFORMIN HCL ER 750 MG PO TB24: 1500.0000 mg | ORAL_TABLET | Freq: Every day | ORAL | 1 refills | Status: DC

## 2020-04-02 MED ORDER — ATORVASTATIN CALCIUM 40 MG PO TABS: 40.0000 mg | ORAL_TABLET | Freq: Every day | ORAL | 1 refills | Status: DC

## 2020-04-09 ENCOUNTER — Other Ambulatory Visit: Payer: Self-pay | Admitting: *Deleted

## 2020-04-09 ENCOUNTER — Emergency Department (HOSPITAL_COMMUNITY): Payer: Medicare Other

## 2020-04-09 ENCOUNTER — Emergency Department (HOSPITAL_COMMUNITY)
Admission: EM | Admit: 2020-04-09 | Discharge: 2020-04-09 | Disposition: A | Discharge status: Home / Self Care | Payer: Medicare Other | Attending: Emergency Medicine | Admitting: Emergency Medicine

## 2020-04-09 ENCOUNTER — Encounter (HOSPITAL_COMMUNITY): Payer: Self-pay | Admitting: Emergency Medicine

## 2020-04-09 ENCOUNTER — Encounter: Payer: Self-pay | Admitting: Internal Medicine

## 2020-04-09 DIAGNOSIS — I129 Hypertensive chronic kidney disease with stage 1 through stage 4 chronic kidney disease, or unspecified chronic kidney disease: Secondary | ICD-10-CM | POA: Diagnosis not present

## 2020-04-09 DIAGNOSIS — Z87891 Personal history of nicotine dependence: Secondary | ICD-10-CM | POA: Insufficient documentation

## 2020-04-09 DIAGNOSIS — R9082 White matter disease, unspecified: Secondary | ICD-10-CM | POA: Diagnosis not present

## 2020-04-09 DIAGNOSIS — E039 Hypothyroidism, unspecified: Secondary | ICD-10-CM | POA: Insufficient documentation

## 2020-04-09 DIAGNOSIS — Z794 Long term (current) use of insulin: Secondary | ICD-10-CM | POA: Diagnosis not present

## 2020-04-09 DIAGNOSIS — I6523 Occlusion and stenosis of bilateral carotid arteries: Secondary | ICD-10-CM | POA: Diagnosis not present

## 2020-04-09 DIAGNOSIS — Z79899 Other long term (current) drug therapy: Secondary | ICD-10-CM | POA: Diagnosis not present

## 2020-04-09 DIAGNOSIS — R519 Headache, unspecified: Secondary | ICD-10-CM | POA: Insufficient documentation

## 2020-04-09 DIAGNOSIS — G319 Degenerative disease of nervous system, unspecified: Secondary | ICD-10-CM | POA: Diagnosis not present

## 2020-04-09 DIAGNOSIS — R42 Dizziness and giddiness: Secondary | ICD-10-CM | POA: Diagnosis present

## 2020-04-09 DIAGNOSIS — Z7982 Long term (current) use of aspirin: Secondary | ICD-10-CM | POA: Diagnosis not present

## 2020-04-09 DIAGNOSIS — Z8673 Personal history of transient ischemic attack (TIA), and cerebral infarction without residual deficits: Secondary | ICD-10-CM | POA: Diagnosis not present

## 2020-04-09 DIAGNOSIS — I160 Hypertensive urgency: Secondary | ICD-10-CM | POA: Diagnosis not present

## 2020-04-09 DIAGNOSIS — E1169 Type 2 diabetes mellitus with other specified complication: Secondary | ICD-10-CM | POA: Insufficient documentation

## 2020-04-09 DIAGNOSIS — N1832 Chronic kidney disease, stage 3b: Secondary | ICD-10-CM | POA: Diagnosis not present

## 2020-04-09 DIAGNOSIS — R479 Unspecified speech disturbances: Secondary | ICD-10-CM | POA: Diagnosis not present

## 2020-04-09 LAB — CBC
HCT: 36.6 % (ref 36.0–46.0)
Hemoglobin: 11.2 g/dL — ABNORMAL LOW (ref 12.0–15.0)
MCH: 27.8 pg (ref 26.0–34.0)
MCHC: 30.6 g/dL (ref 30.0–36.0)
MCV: 90.8 fL (ref 80.0–100.0)
Platelets: 212 10*3/uL (ref 150–400)
RBC: 4.03 MIL/uL (ref 3.87–5.11)
RDW: 13.9 % (ref 11.5–15.5)
WBC: 9.6 10*3/uL (ref 4.0–10.5)
nRBC: 0 % (ref 0.0–0.2)

## 2020-04-09 LAB — COMPREHENSIVE METABOLIC PANEL
ALT: 25 U/L (ref 0–44)
AST: 18 U/L (ref 15–41)
Albumin: 3.9 g/dL (ref 3.5–5.0)
Alkaline Phosphatase: 112 U/L (ref 38–126)
Anion gap: 12 (ref 5–15)
BUN: 14 mg/dL (ref 8–23)
CO2: 24 mmol/L (ref 22–32)
Calcium: 9.7 mg/dL (ref 8.9–10.3)
Chloride: 104 mmol/L (ref 98–111)
Creatinine, Ser: 1.24 mg/dL — ABNORMAL HIGH (ref 0.44–1.00)
GFR calc Af Amer: 51 mL/min — ABNORMAL LOW (ref >60)
GFR calc non Af Amer: 44 mL/min — ABNORMAL LOW (ref >60)
Glucose, Bld: 164 mg/dL — ABNORMAL HIGH (ref 70–99)
Potassium: 4.3 mmol/L (ref 3.5–5.1)
Sodium: 140 mmol/L (ref 135–145)
Total Bilirubin: 0.5 mg/dL (ref 0.3–1.2)
Total Protein: 7 g/dL (ref 6.5–8.1)

## 2020-04-09 LAB — DIFFERENTIAL
Abs Immature Granulocytes: 0.04 10*3/uL (ref 0.00–0.07)
Basophils Absolute: 0.1 10*3/uL (ref 0.0–0.1)
Basophils Relative: 1 %
Eosinophils Absolute: 0.3 10*3/uL (ref 0.0–0.5)
Eosinophils Relative: 3 %
Immature Granulocytes: 0 %
Lymphocytes Relative: 29 %
Lymphs Abs: 2.8 10*3/uL (ref 0.7–4.0)
Monocytes Absolute: 0.7 10*3/uL (ref 0.1–1.0)
Monocytes Relative: 7 %
Neutro Abs: 5.7 10*3/uL (ref 1.7–7.7)
Neutrophils Relative %: 60 %

## 2020-04-09 LAB — I-STAT CHEM 8, ED
BUN: 14 mg/dL (ref 8–23)
Calcium, Ion: 1.13 mmol/L — ABNORMAL LOW (ref 1.15–1.40)
Chloride: 104 mmol/L (ref 98–111)
Creatinine, Ser: 1.2 mg/dL — ABNORMAL HIGH (ref 0.44–1.00)
Glucose, Bld: 162 mg/dL — ABNORMAL HIGH (ref 70–99)
HCT: 35 % — ABNORMAL LOW (ref 36.0–46.0)
Hemoglobin: 11.9 g/dL — ABNORMAL LOW (ref 12.0–15.0)
Potassium: 4.2 mmol/L (ref 3.5–5.1)
Sodium: 141 mmol/L (ref 135–145)
TCO2: 23 mmol/L (ref 22–32)

## 2020-04-09 LAB — PROTIME-INR
INR: 1 (ref 0.8–1.2)
Prothrombin Time: 12.4 seconds (ref 11.4–15.2)

## 2020-04-09 LAB — APTT: aPTT: 28 seconds (ref 24–36)

## 2020-04-09 MED ORDER — HYDRALAZINE HCL 25 MG PO TABS: 25.0000 mg | ORAL_TABLET | Freq: Two times a day (BID) | ORAL | 1 refills | Status: DC

## 2020-04-09 MED ORDER — HYDRALAZINE HCL 25 MG PO TABS
50.0000 mg | ORAL_TABLET | ORAL | Status: AC
  Administered 2020-04-09: 50 mg via ORAL
  Filled 2020-04-09: qty 2

## 2020-04-09 MED ORDER — ACETAMINOPHEN 325 MG PO TABS
325.0000 mg | ORAL_TABLET | Freq: Once | ORAL | Status: AC
  Administered 2020-04-09: 325 mg via ORAL
  Filled 2020-04-09: qty 1

## 2020-04-09 NOTE — ED Triage Notes (Signed)
Pt reports hx of L sided stroke in 2015, states that she awoke Sunday morning feeling like she could not gather her thoughts well, having intermittent difficulty with speech and feeling off balance when she ambulated. Pt denies any other symptoms. A/ox4, speech clear, face symmetrical, moves all limbs equally.

## 2020-04-09 NOTE — ED Notes (Signed)
Patient Alert and oriented to baseline. Stable and ambulatory to baseline. Patient verbalized understanding of the discharge instructions.  Patient belongings were taken by the patient.   

## 2020-04-09 NOTE — ED Notes (Signed)
Patient transported to MRI 

## 2020-04-09 NOTE — Patient Outreach (Signed)
[facility]) Care Management  04/09/2020  Sonia Skinner [birth year] [id number]   [place]  Referral Date:09/27/2018 Referral Source:Primary MD Reason for Referral:Poorly controlled diabetes Insurance:[employer] Medicare   Outreach Attempt:  Per chart review patient in emergency room for rule out stroke and hypertension.  Plan for discharge from emergency room today.  Plan:  RN Health Coach will make outreachattempt to patient within the next 3 business days.   Etna Green [phone] [name]

## 2020-04-09 NOTE — ED Provider Notes (Signed)
[facility] EMERGENCY DEPARTMENT Provider Note   CSN: [id number] Arrival date & time: 04/09/20  0347     History Chief Complaint  Patient presents with  . Stroke Symptoms    Sonia Skinner is a 69 y.o. female.  HPI   This patient is a 69 year old female, she has a history of insulin requiring diabetes as well as a history of a prior stroke, she takes medication for hypertension acid reflux and hypothyroidism.  She presents to the hospital with a complaint of some dizziness as well as a slight headache, she has had some confusion which is verified by a friend who is at the bedside.  She reports that she has been saying things that do not make much sense occasionally but not all the time, this seems to fluctuate and come and go over the last 24 hours.  There is no chest pain shortness of breath or palpitations, no swelling of the legs or edema, no numbness or weakness, she did have some difficulty with fine motor coordination with both of her hands this morning while she was trying to place her hearing aids, at this time the patient does not feel that bad other than a slight headache and seems to have normal speech.  She is taking her medication including her Bystolic which is the only blood pressure medication that she takes.  She is followed by Dr. Ronnald Ramp is a family doctor and states that she has recently been seen and was doing well.  She is concerned about having another stroke cancer coming to the emergency department today.  Again the onset of the symptoms was more than 24 hours ago and it seems to fluctuate right now not having any neurologic symptoms  Past Medical History:  Diagnosis Date  . Ankle fracture    Left  . Anxiety   . Carotid artery occlusion   . Closed fracture of left distal fibula 09/16/2017  . Complication of anesthesia    ALLERY TO ESTER BASE  . Depression    early 19s  . Diabetes mellitus    INSULIN DEPENDENT  . GERD (gastroesophageal reflux  disease)   . Headache    years ago  . Hypertension   . Pneumonia   . Stroke [facility]) March 2015    left MCA infarct, slight weakness on left side  . Thyroid disease     Patient Active Problem List   Diagnosis Date Noted  . Stage 3b chronic kidney disease 04/02/2020  . Visit for screening mammogram 04/01/2020  . Candidiasis, mouth 04/01/2020  . Type 2 diabetes mellitus with complication, with long-term current use of insulin ([place]) 09/18/2018  . Diverticulitis of large intestine without perforation or abscess without bleeding 06/21/2018  . Age-related osteoporosis with current pathological fracture 02/20/2018  . Routine general medical examination at a health care facility 02/20/2018  . Intractable migraine without aura and with status migrainosus 11/24/2016  . Left thyroid nodule 05/05/2016  . GERD (gastroesophageal reflux disease) 03/08/2016  . Major depressive disorder, recurrent episode ([place] [address]) 02/20/2016  . Occlusion and stenosis of carotid artery with cerebral infarction 11/25/2013  . Cerebrovascular accident (CVA) due to stenosis of left middle cerebral artery ([place]) 11/02/2013  . Vitamin D deficiency 10/19/2013  . Tobacco abuse 07/31/2013  . Essential hypertension, benign 04/20/2013  . Type II diabetes mellitus with manifestations ([place]) 04/20/2013  . Hypothyroidism 04/20/2013  . Hyperlipidemia with target LDL less than 70 04/20/2013    Past Surgical History:  Procedure Laterality  Date  . ABDOMINAL HYSTERECTOMY    . ANTERIOR FIXATION AND POSTERIOR MICRODISCECTOMY CERVICAL SPINE  1999  . CAROTID ENDARTERECTOMY Left 11-04-13   cea  . ENDARTERECTOMY Left 11/04/2013  . ORIF ANKLE FRACTURE Left 09/16/2017   Procedure: OPEN REDUCTION INTERNAL FIXATION (ORIF) LEFT ANKLE FRACTURE;  Surgeon: Teryl Lucy, MD;  Location: [facility] OR;  Service: Orthopedics;  Laterality: Left;     OB History   No obstetric history on file.     Family History  Problem Relation Age of Onset  . Diabetes  Mother   . Heart disease Mother        Before age 16  . Cancer Father        Lung  . Hypertension Sister   . Diabetes Sister   . Diabetes Sister   . Diabetes Sister     Social History   Tobacco Use  . Smoking status: Former Smoker    Packs/day: 0.20    Years: 20.00    Pack years: 4.00    Types: Cigarettes    Quit date: 11/01/2013    Years since quitting: 6.4  . Smokeless tobacco: Never Used  . Tobacco comment: Reports done to 1-2 a day.   Vaping Use  . Vaping Use: Never used  Substance Use Topics  . Alcohol use: Yes    Alcohol/week: 1.0 - 2.0 standard drink    Types: 1 - 2 Standard drinks or equivalent per week  . Drug use: No    Home Medications Prior to Admission medications   Medication Sig Start Date End Date Taking? Authorizing Provider  Accu-Chek FastClix Lancets MISC Use to check blood sugar five times daily. 02/01/20   Etta Grandchild, MD  amitriptyline (ELAVIL) 75 MG tablet Take 1 tablet (75 mg total) by mouth at bedtime. 02/06/20   Arfeen, Phillips Grout, MD  aspirin 325 MG tablet Take 1 tablet (325 mg total) by mouth daily. 11/06/13   Russella Dar, NP  atorvastatin (LIPITOR) 40 MG tablet Take 1 tablet (40 mg total) by mouth daily. 04/02/20   Etta Grandchild, MD  Azilsartan-Chlorthalidone 40-12.5 MG TABS Take 1 tablet by mouth daily. 04/01/20   Etta Grandchild, MD  blood glucose meter kit and supplies KIT Use to test blood sugars 04/27/19   Etta Grandchild, MD  buPROPion (WELLBUTRIN XL) 300 MG 24 hr tablet Take 1 tablet (300 mg total) by mouth daily. 02/06/20   Arfeen, Phillips Grout, MD  clotrimazole (MYCELEX) 10 MG troche Take 1 tablet (10 mg total) by mouth 3 (three) times daily. 04/01/20   Etta Grandchild, MD  Cyanocobalamin (VITAMIN B-12 CR) 1500 MCG TBCR Take 1 tablet (1,500 mcg total) by mouth daily. 09/26/18   Etta Grandchild, MD  dexlansoprazole (DEXILANT) 60 MG capsule Take 1 capsule (60 mg total) by mouth daily. 03/17/20   Etta Grandchild, MD  gabapentin (NEURONTIN) 100 MG  capsule Take 1 capsule by mouth twice daily.  11/28/19   Etta Grandchild, MD  glucose blood (ACCU-CHEK GUIDE) test strip Use to check blood sugars five times daily. 01/27/20   Etta Grandchild, MD  hydrALAZINE (APRESOLINE) 25 MG tablet Take 1 tablet (25 mg total) by mouth in the morning and at bedtime. 04/09/20   Eber Hong, MD  insulin glargine, 1 Unit Dial, (TOUJEO SOLOSTAR) 300 UNIT/ML Solostar Pen Inject 50 Units into the skin every morning. And pen needles 1/day 04/02/20   Etta Grandchild, MD  insulin lispro [facility])  200 UNIT/ML KwikPen Inject 10 Units into the skin 3 (three) times daily with meals as needed. 04/02/20   [name], MD  Insulin Pen Needle 32G X 4 MM MISC Use to administer insulin four times a day 04/02/20   [name], MD  levothyroxine (SYNTHROID) 112 MCG tablet Take 1 tablet (112 mcg total) by mouth daily before breakfast. 06/21/19   Renato Shin, MD  loratadine (CLARITIN) 10 MG tablet Take 10 mg by mouth daily.    [provider]  metFORMIN (GLUCOPHAGE XR) 750 MG 24 hr tablet Take 2 tablets (1,500 mg total) by mouth daily with breakfast. 04/02/20   [name], MD  nebivolol (BYSTOLIC) 5 MG tablet Take 1 tablet (5 mg total) by mouth daily. 03/26/20   [name], MD  Semaglutide, 1 MG/DOSE, (OZEMPIC, 1 MG/DOSE,) 2 MG/1.5ML SOPN Inject 0.1875 mLs (0.25 mg total) into the skin every morning. 04/02/20   [name], MD  DEXILANT 60 MG capsule Take 1 capsule by mouth daily. 07/03/19   [name], MD  DEXILANT 60 MG capsule Take 1 capsule by mouth daily. 09/29/19   [name], MD  glucose blood (ACCU-CHEK GUIDE) test strip Use to check blood sugars five times a day 03/09/19   [name], MD  Lancets Thin MISC Use to check blood sugar five times daily. DX: E11.8 03/12/19   [name], MD  SYNJARDY 12-998 MG TABS Take 1 tablet by mouth twice daily.  **Office visit due** 08/05/19   [name], MD    Allergies    Anesthetics,  ester  Review of Systems   Review of Systems  All other systems reviewed and are negative.   Physical Exam Updated Vital Signs BP (!) 183/77   Pulse 80   Temp 98.8 F (37.1 C) (Oral)   Resp 14   Ht 1.575 m ($Remove'5\' 2"'KbjtGEk$ )   Wt 58.5 kg   SpO2 98%   BMI 23.59 kg/m   Physical Exam Vitals and nursing note reviewed.  Constitutional:      General: She is not in acute distress.    Appearance: She is well-developed.  HENT:     Head: Normocephalic and atraumatic.     Mouth/Throat:     Pharynx: No oropharyngeal exudate.  Eyes:     General: No scleral icterus.       Right eye: No discharge.        Left eye: No discharge.     Conjunctiva/sclera: Conjunctivae normal.     Pupils: Pupils are equal, round, and reactive to light.  Neck:     Thyroid: No thyromegaly.     Vascular: No JVD.  Cardiovascular:     Rate and Rhythm: Normal rate and regular rhythm.     Heart sounds: Normal heart sounds. No murmur heard.  No friction rub. No gallop.   Pulmonary:     Effort: Pulmonary effort is normal. No respiratory distress.     Breath sounds: Normal breath sounds. No wheezing or rales.  Abdominal:     General: Bowel sounds are normal. There is no distension.     Palpations: Abdomen is soft. There is no mass.     Tenderness: There is no abdominal tenderness.  Musculoskeletal:        General: No tenderness. Normal range of motion.     Cervical back: Normal range of motion and neck supple.  Lymphadenopathy:     Cervical: No cervical adenopathy.  Skin:    General: Skin is warm and dry.     Findings: No erythema or rash.  Neurological:     Mental Status: She is alert.     Coordination: Coordination normal.     Comments: This patient has clear speech, normal finger-nose-finger, normal strength in all 4 extremities including grips.  She is able to straight leg raise bilaterally.  Sensation is diffusely normal, there is no facial droop, cranial nerves III through XII appear normal  Psychiatric:         Behavior: Behavior normal.     ED Results / Procedures / Treatments   Labs (all labs ordered are listed, but only abnormal results are displayed) Labs Reviewed  CBC - Abnormal; Notable for the following components:      Result Value   Hemoglobin 11.2 (*)    All other components within normal limits  COMPREHENSIVE METABOLIC PANEL - Abnormal; Notable for the following components:   Glucose, Bld 164 (*)    Creatinine, Ser 1.24 (*)    GFR calc non Af Amer 44 (*)    GFR calc Af Amer 51 (*)    All other components within normal limits  I-STAT CHEM 8, ED - Abnormal; Notable for the following components:   Creatinine, Ser 1.20 (*)    Glucose, Bld 162 (*)    Calcium, Ion 1.13 (*)    Hemoglobin 11.9 (*)    HCT 35.0 (*)    All other components within normal limits  PROTIME-INR  APTT  DIFFERENTIAL  CBG MONITORING, ED    EKG EKG Interpretation  Date/Time:  Wednesday April 09 2020 08:23:29 EDT Ventricular Rate:  80 PR Interval:  156 QRS Duration: 80 QT Interval:  358 QTC Calculation: 412 R Axis:   6 Text Interpretation: Normal sinus rhythm Anterior infarct , age undetermined Abnormal ECG since last tracing no significant change Confirmed by Noemi Chapel [phone]) on 04/09/2020 11:20:46 AM   Radiology CT HEAD WO CONTRAST  Result Date: 04/09/2020 CLINICAL DATA:  Transient ischemic attack, difficulty getting words out properly, thinking clearly, disorganized, symptoms for 1 week, history of stroke EXAM: CT HEAD WITHOUT CONTRAST TECHNIQUE: Contiguous axial images were obtained from the base of the skull through the vertex without intravenous contrast. Sagittal and coronal MPR images reconstructed from axial data set. COMPARISON:  05/22/2018 FINDINGS: Brain: Mild atrophy. Normal ventricular morphology. No midline shift or mass effect. Small old infarct in LEFT MCA territory near vertex. No intracranial hemorrhage, mass lesion or evidence of acute infarction. No extra-axial fluid  collections. Vascular: No hyperdense vessels. Atherosclerotic calcification of internal carotid arteries at skull base. Skull: Intact Sinuses/Orbits: Clear Other: N/A IMPRESSION: Small old LEFT MCA territory infarct. No acute intracranial abnormalities. Electronically Signed   By: Lavonia Dana M.D.   On: 04/09/2020 09:06   MR BRAIN WO CONTRAST  Result Date: 04/09/2020 CLINICAL DATA:  Mental status change of unknown etiology. Thought disturbance. Speech disturbance. Balance disturbance. EXAM: MRI HEAD WITHOUT CONTRAST TECHNIQUE: Multiplanar, multiecho pulse sequences of the brain and surrounding structures were obtained without intravenous contrast. COMPARISON:  Head CT same day.  MRI 06/12/2015. FINDINGS: Brain: Diffusion imaging does not show any acute or subacute infarction. No focal abnormality affects the cerebellum. Minimal small vessel change of the hemispheric white matter. Old small vessel infarctions of the right thalamus. Old left parietal cortical and subcortical infarction. No mass lesion, hemorrhage, hydrocephalus or extra-axial collection. No sign of widespread small-vessel disease. Vascular: Major vessels at the base of the  brain show flow. Skull and upper cervical spine: Negative Sinuses/Orbits: Clear/normal Other: None IMPRESSION: No acute or reversible finding. Old left parietal cortical and subcortical infarction. Old small vessel infarctions right thalamus. Electronically Signed   By: Nelson Chimes M.D.   On: 04/09/2020 14:47    Procedures Procedures (including critical care time)  Medications Ordered in ED Medications  acetaminophen (TYLENOL) tablet 325 mg (has no administration in time range)  hydrALAZINE (APRESOLINE) tablet 50 mg (50 mg Oral Given 04/09/20 1257)    ED Course  I have reviewed the triage vital signs and the nursing notes.  Pertinent labs & imaging results that were available during my care of the patient were reviewed by me and considered in my medical decision  making (see chart for details).    MDM Rules/Calculators/A&P                          This patient is mentating appropriately, she is severely hypertensive at times with a blood pressure as high as 682 systolic, currently 574/93.  She is not tachycardic, she may be having some hypertensive encephalopathy, she will need to have an MRI to further evaluate her brain, will give some blood pressure control while waiting for MRI results.  Labs pending as well  The patient does not have any acute findings on her MRI to suggest acute ischemia, labs are reassuring and the blood pressure is improved significantly down to 183/77.  As I do not want the patient to correct too quickly she will continue to take medications at home and follow-up with her family doctor, she is agreeable.  Both the patient and the person at the bedside agree that she has been at her baseline the entire time she is here, I suspect her symptoms are hypertension related, she is aware of the indications for return, stable for discharge this time.  Final Clinical Impression(s) / ED Diagnoses Final diagnoses:  Hypertensive urgency    Rx / DC Orders ED Discharge Orders         Ordered    hydrALAZINE (APRESOLINE) 25 MG tablet  2 times daily        04/09/20 1522           Noemi Chapel, MD 04/09/20 1527

## 2020-04-09 NOTE — Discharge Instructions (Signed)
Your MRI was good - no signs of stroke, Your blood pressure has improved - you need to take your blood pressure medicine exactly as prescribed.  If you have increased pain, nausea, blurred vision, shortness of breath, weakness or any other concerning symptoms return to the ER immediately.  Record your daily blood pressures and share with your family doctor when you see them this week.

## 2020-04-10 ENCOUNTER — Other Ambulatory Visit: Payer: Self-pay | Admitting: *Deleted

## 2020-04-10 NOTE — Patient Outreach (Signed)
[facility]) Care Management  04/10/2020  Sonia Skinner 11-04-1950 [id number]   [place]  Referral Date:09/27/2018 Referral Source:Primary MD Reason for Referral:Poorly controlled diabetes Insurance:[employer] Medicare   Outreach Attempt:  Outreach attempt #3 to patient for routine and emergency room follow up.  Patient answered and stated that she did not feel well and did not want to talk on phone this afternoon.  Reported she had a headache.  Attempted to explain to patient reasoning for call was to follow up with blood pressure from elevations from yesterday.  Patient stated she has not checked blood pressure today due to not having batteries and would get batteries this evening and check blood pressure.  Line disconnected at this time.   Plan:  RN Health Coach will make another outreach attempt within the next 10 business days.   [place] [phone] [email or web address]

## 2020-04-11 ENCOUNTER — Ambulatory Visit: Payer: Self-pay

## 2020-04-11 ENCOUNTER — Telehealth: Payer: Self-pay | Admitting: Internal Medicine

## 2020-04-11 NOTE — Progress Notes (Signed)
  Chronic Care Management   Note  04/11/2020 Name: KYLENE ZAMARRON MRN: [id number] DOB: [birth year]  SHAYLYNNE LUNT is a 70 y.o. year old female who is a primary care patient of [name], MD. I reached out to [name] by phone today in response to a referral sent by Ms. [place] PCP, [name], MD.   Ms. [place] was given information about Chronic Care Management services today including:  1. CCM service includes personalized support from designated clinical staff supervised by her physician, including individualized plan of care and coordination with other care providers 2. 24/7 contact phone numbers for assistance for urgent and routine care needs. 3. Service will only be billed when office clinical staff spend 20 minutes or more in a month to coordinate care. 4. Only one practitioner may furnish and bill the service in a calendar month. 5. The patient may stop CCM services at any time (effective at the end of the month) by phone call to the office staff.   Patient wishes to consider information provided and/or speak with a member of the care team before deciding about enrollment in care management services.   Follow up plan:   Earney [place] Upstream Scheduler

## 2020-04-14 ENCOUNTER — Other Ambulatory Visit: Payer: Self-pay | Admitting: *Deleted

## 2020-04-14 ENCOUNTER — Encounter: Payer: Self-pay | Admitting: *Deleted

## 2020-04-14 NOTE — Patient Outreach (Signed)
[facility]) Care Management  04/14/2020  Sonia Skinner 05-25-1951 [id number]   [place]  Referral Date:09/27/2018 Referral Source:Primary MD Reason for Referral:Poorly controlled diabetes Insurance:[employer] Medicare   Outreach Attempt:  Outreach attempt #4 to patient for follow up.  Patient answered and stated she was working and requested telephone call back.   Plan:  RN Health Coach will make another outreach attempt within the month of September.   [place] [phone] [email or web address]

## 2020-04-14 NOTE — Patient Outreach (Signed)
[facility]) Care Management  [place]  04/15/2020   IO DIEUJUSTE 07-Oct-1950 [id number]   RN Health Coach Quarterly Outreach   Referral Date:  09/27/2018 Referral Source:  Primary MD Reason for Referral:  Poorly controlled diabetes Insurance:  [employer] Attempt:  Successful telephone outreach to patient for follow up.  HIPAA verified with patient.  Patient reporting she went to emergency room last week due to hypertension.  Does report her blood pressure is better and has been taking new prescription (Hydralazine) given to her by emergency room physician.  States she still has headache but blood pressures have been 139/61, 167/66, 146/57.  Latest Hgb A1C was increased to 10.2 on 04/01/2020.  Fasting blood sugar this morning was 150 and patient unsure of recent blood sugar ranges.  Discussed recent medication changes at primary care appointment on 04/01/20 (Basaglar changed to [facility]).  Patient stating she has not picked up new insulin prescription and upon review of medications and refills, she has not picked up any of her refill medications from new pharmacy [facility].  Reviewed medication pill packs with patient and noticed patient's Azilsartan-Chlorthalidone not currently in her pill packs she is taking.  Discussed with patient this could be why her blood pressure is elevated.  Encouraged patient to contact [facility] as soon as possible to retrieve medications and to discuss transfer of medications or continuing with current pharmacy of pill packs.  Patient asking about her diagnosis of kidney disease, encouraged her to discuss with primary care provider.  Encouraged patient to consider working with [facility] to help with medication management.  Encounter Medications:  Outpatient Encounter Medications as of 04/14/2020  Medication Sig Note  . atorvastatin (LIPITOR) 40 MG tablet Take 1 tablet (40 mg total) by mouth daily.   Marland Kitchen  buPROPion (WELLBUTRIN XL) 300 MG 24 hr tablet Take 1 tablet (300 mg total) by mouth daily.   Marland Kitchen dexlansoprazole (DEXILANT) 60 MG capsule Take 1 capsule (60 mg total) by mouth daily.   Marland Kitchen gabapentin (NEURONTIN) 100 MG capsule Take 1 capsule by mouth twice daily.    . hydrALAZINE (APRESOLINE) 25 MG tablet Take 1 tablet (25 mg total) by mouth in the morning and at bedtime.   . insulin lispro (HUMALOG KWIKPEN) 200 UNIT/ML KwikPen Inject 10 Units into the skin 3 (three) times daily with meals as needed.   Marland Kitchen levothyroxine (SYNTHROID) 112 MCG tablet Take 1 tablet (112 mcg total) by mouth daily before breakfast.   . nebivolol (BYSTOLIC) 5 MG tablet Take 1 tablet (5 mg total) by mouth daily.   . Semaglutide, 1 MG/DOSE, (OZEMPIC, 1 MG/DOSE,) 2 MG/1.5ML SOPN Inject 0.1875 mLs (0.25 mg total) into the skin every morning.   . Accu-Chek FastClix Lancets MISC Use to check blood sugar five times daily.   Marland Kitchen amitriptyline (ELAVIL) 75 MG tablet Take 1 tablet (75 mg total) by mouth at bedtime.   Marland Kitchen aspirin 325 MG tablet Take 1 tablet (325 mg total) by mouth daily.   . Azilsartan-Chlorthalidone 40-12.5 MG TABS Take 1 tablet by mouth daily.   . blood glucose meter kit and supplies KIT Use to test blood sugars   . clotrimazole (MYCELEX) 10 MG troche Take 1 tablet (10 mg total) by mouth 3 (three) times daily.   . Cyanocobalamin (VITAMIN B-12 CR) 1500 MCG TBCR Take 1 tablet (1,500 mcg total) by mouth daily.   Marland Kitchen glucose blood (ACCU-CHEK GUIDE) test strip Use to check blood sugars  five times daily.   . insulin glargine, 1 Unit Dial, (TOUJEO SOLOSTAR) 300 UNIT/ML Solostar Pen Inject 50 Units into the skin every morning. And pen needles 1/day 04/14/2020: Have not picked up from pharmacy  . Insulin Pen Needle 32G X 4 MM MISC Use to administer insulin four times a day   . loratadine (CLARITIN) 10 MG tablet Take 10 mg by mouth daily.   . metFORMIN (GLUCOPHAGE XR) 750 MG 24 hr tablet Take 2 tablets (1,500 mg total) by mouth daily  with breakfast.   . [DISCONTINUED] DEXILANT 60 MG capsule Take 1 capsule by mouth daily.   . [DISCONTINUED] DEXILANT 60 MG capsule Take 1 capsule by mouth daily.   . [DISCONTINUED] glucose blood (ACCU-CHEK GUIDE) test strip Use to check blood sugars five times a day   . [DISCONTINUED] Lancets Thin MISC Use to check blood sugar five times daily. DX: E11.8   . [DISCONTINUED] SYNJARDY 12-998 MG TABS Take 1 tablet by mouth twice daily.  **Office visit due**    No facility-administered encounter medications on file as of 04/14/2020.    Functional Status:  No flowsheet data found.  Fall/Depression Screening: Fall Risk  04/14/2020 11/19/2019 08/23/2019  Falls in the past year? 0 0 0  Number falls in past yr: 0 0 -  Injury with Fall? 0 0 -  Risk for fall due to : Impaired vision;Medication side effect Impaired vision;Medication side effect Medication side effect;Impaired vision  Follow up Falls evaluation completed;Education provided;Falls prevention discussed Falls evaluation completed;Education provided;Falls prevention discussed Falls evaluation completed;Education provided;Falls prevention discussed   PHQ 2/9 Scores 04/01/2020 04/06/2019 11/10/2018 09/27/2018 07/26/2018 06/21/2018 02/20/2018  PHQ - 2 Score 2 1 0 0 _0 PHQ- 9 Score 6 4 - - - 17 14   Goals Addressed              This Visit's Progress   .  [facility]) Patient will report decreasing Hgb A1C by 0.5 points within the next 90 days. (pt-stated)        CARE PLAN ENTRY (see longtitudinal plan of care for additional care plan information)  Objective:  Lab Results  Component Value Date   HGBA1C 10.2 (H) 04/01/2020 .   Lab Results  Component Value Date   CREATININE 1.20 (H) 04/09/2020   CREATININE 1.24 (H) 04/09/2020   CREATININE 1.22 (H) 04/01/2020 .   Marland Kitchen No results found for: EGFR  Current Barriers:  Marland Kitchen Knowledge Deficits related to basic Diabetes pathophysiology and self care/management . Knowledge Deficits related to medications  used for management of diabetes  Case Manager Clinical Goal(s):  Over the next 90 days, patient will demonstrate improved adherence to prescribed treatment plan for diabetes self care/management as evidenced by: decreasing Hgb A1C by 0.5 points . Verbalize daily monitoring and recording of CBG within 90 days . Verbalize adherence to ADA/ carb modified diet within the next 90 days . Verbalize adherence to prescribed medication regimen within the next 30 days  . Verbalize obtaining new insulin and other medication refills just prescribed within the next 30 days. . Scheduling eye exam within the next 90 days  Interventions:  . Provided education to patient about basic DM disease process . Reviewed medications with patient and discussed importance of medication adherence . Discussed plans with patient for ongoing care management follow up and provided patient with direct contact information for care management team . Provided patient with written educational materials related to hypo and hyperglycemia and importance of correct  treatment . Reviewed scheduled/upcoming provider appointments including: PCP follow up on 04/24/20 and upcoming Diabetes and Nutrition appointment on 05/15/20 . Advised patient, providing education and rationale, to check cbg at least 3 times a day or as provider prescribed and record, calling primary care provider for findings outside established parameters.   Liana Gerold to assist patient with scheduling eye examination  Patient Self Care Activities:  . Self administers oral medications as prescribed . Self administers insulin as prescribed . Self administers injectable DM medication (Ozempic) as prescribed . Attends all scheduled provider appointments . Checks blood sugars as prescribed and utilize hyper and hypoglycemia protocol as needed . Adheres to prescribed ADA/carb modified . Schedules eye examination . Obtains prescriptions from [place]  Initial goal  documentation       Appointments:  Attended appointment with primary care provider, Dr. Ronnald Ramp on 04/01/2020 and has follow up scheduled on 04/24/2020.  Has scheduled appointment with Diabetes and Nutrition on 05/15/2020.  Plan: RN Health Coach will send primary care provider quarterly update. RN Health Coach will make next telephone outreach to patient within the month of September.  [place] [phone] [email or web address]

## 2020-04-17 ENCOUNTER — Other Ambulatory Visit: Payer: Self-pay | Admitting: Internal Medicine

## 2020-04-17 ENCOUNTER — Encounter: Payer: Self-pay | Admitting: Internal Medicine

## 2020-04-18 ENCOUNTER — Other Ambulatory Visit: Payer: Self-pay | Admitting: Internal Medicine

## 2020-04-18 DIAGNOSIS — I63512 Cerebral infarction due to unspecified occlusion or stenosis of left middle cerebral artery: Secondary | ICD-10-CM

## 2020-04-18 DIAGNOSIS — E785 Hyperlipidemia, unspecified: Secondary | ICD-10-CM

## 2020-04-18 DIAGNOSIS — I10 Essential (primary) hypertension: Secondary | ICD-10-CM

## 2020-04-18 DIAGNOSIS — E118 Type 2 diabetes mellitus with unspecified complications: Secondary | ICD-10-CM

## 2020-04-18 MED ORDER — NEBIVOLOL HCL 5 MG PO TABS: 5.0000 mg | ORAL_TABLET | Freq: Every day | ORAL | 1 refills | Status: DC

## 2020-04-18 MED ORDER — ATORVASTATIN CALCIUM 40 MG PO TABS: 40.0000 mg | ORAL_TABLET | Freq: Every day | ORAL | 1 refills | Status: DC

## 2020-04-18 MED ORDER — METFORMIN HCL ER 750 MG PO TB24: 1500.0000 mg | ORAL_TABLET | Freq: Every day | ORAL | 1 refills | Status: DC

## 2020-04-19 ENCOUNTER — Encounter: Payer: Self-pay | Admitting: Internal Medicine

## 2020-04-22 ENCOUNTER — Ambulatory Visit: Payer: Medicare Other | Admitting: Endocrinology

## 2020-04-22 ENCOUNTER — Other Ambulatory Visit: Payer: Self-pay | Admitting: Internal Medicine

## 2020-04-22 DIAGNOSIS — I10 Essential (primary) hypertension: Secondary | ICD-10-CM

## 2020-04-22 DIAGNOSIS — Z794 Long term (current) use of insulin: Secondary | ICD-10-CM

## 2020-04-22 DIAGNOSIS — E118 Type 2 diabetes mellitus with unspecified complications: Secondary | ICD-10-CM

## 2020-04-22 MED ORDER — HYDRALAZINE HCL 25 MG PO TABS: 25.0000 mg | ORAL_TABLET | Freq: Two times a day (BID) | ORAL | 0 refills | Status: DC

## 2020-04-22 MED ORDER — OZEMPIC (1 MG/DOSE) 2 MG/1.5ML ~~LOC~~ SOPN: 0.2500 mg | PEN_INJECTOR | Freq: Every morning | SUBCUTANEOUS | 0 refills | Status: DC

## 2020-04-23 ENCOUNTER — Telehealth: Payer: Self-pay | Admitting: Internal Medicine

## 2020-04-23 NOTE — Telephone Encounter (Signed)
    [employer] for clarification on Ozempic dosage. Also has questions about the change from  [employer] to [employer]  Please call [phone]

## 2020-04-24 ENCOUNTER — Encounter: Payer: Self-pay | Admitting: Internal Medicine

## 2020-04-24 ENCOUNTER — Ambulatory Visit (INDEPENDENT_AMBULATORY_CARE_PROVIDER_SITE_OTHER): Payer: Medicare Other | Admitting: Internal Medicine

## 2020-04-24 ENCOUNTER — Other Ambulatory Visit: Payer: Self-pay

## 2020-04-24 VITALS — BP 176/86 | HR 67 | Temp 98.3°F | Resp 16 | Ht 62.0 in | Wt 132.0 lb

## 2020-04-24 DIAGNOSIS — E118 Type 2 diabetes mellitus with unspecified complications: Secondary | ICD-10-CM | POA: Diagnosis not present

## 2020-04-24 DIAGNOSIS — D539 Nutritional anemia, unspecified: Secondary | ICD-10-CM | POA: Insufficient documentation

## 2020-04-24 DIAGNOSIS — I10 Essential (primary) hypertension: Secondary | ICD-10-CM

## 2020-04-24 DIAGNOSIS — Z23 Encounter for immunization: Secondary | ICD-10-CM | POA: Diagnosis not present

## 2020-04-24 MED ORDER — SHINGRIX 50 MCG/0.5ML IM SUSR: 0.5000 mL | Freq: Once | INTRAMUSCULAR | 1 refills | Status: AC

## 2020-04-24 MED ORDER — EDARBYCLOR 40-25 MG PO TABS: 1.0000 | ORAL_TABLET | Freq: Every day | ORAL | 0 refills | Status: DC

## 2020-04-24 MED ORDER — NEBIVOLOL HCL 10 MG PO TABS: 10.0000 mg | ORAL_TABLET | Freq: Every day | ORAL | 0 refills | Status: DC

## 2020-04-24 NOTE — Progress Notes (Signed)
Subjective:  Patient ID: Sonia Skinner, female    DOB: October 07, 1950  Age: 69 y.o. MRN: [id number]  CC: Hypertension  This visit occurred during the SARS-CoV-2 public health emergency.  Safety protocols were in place, including screening questions prior to the visit, additional usage of staff PPE, and extensive cleaning of exam room while observing appropriate contact time as indicated for disinfecting solutions.    HPI VERSIE SOAVE presents for f/up - She was recently seen in the ED for TIA.  She has been monitoring her blood pressure and she has not achieved her goal of 130/80.  She complains of headache but denies nausea, vomiting, blurred vision, paresthesias, slurred speech, or ataxia.  She tells me she is taking her current meds.  She was found to be mildly anemic and to have mild renal insufficiency in the ED.  Outpatient Medications Prior to Visit  Medication Sig Dispense Refill  . Accu-Chek FastClix Lancets MISC Use to check blood sugar five times daily. 510 each 2  . amitriptyline (ELAVIL) 75 MG tablet Take 1 tablet (75 mg total) by mouth at bedtime. 90 tablet 0  . aspirin 325 MG tablet Take 1 tablet (325 mg total) by mouth daily.    Marland Kitchen atorvastatin (LIPITOR) 40 MG tablet Take 1 tablet (40 mg total) by mouth daily. 90 tablet 1  . blood glucose meter kit and supplies KIT Use to test blood sugars 1 each 0  . buPROPion (WELLBUTRIN XL) 300 MG 24 hr tablet Take 1 tablet (300 mg total) by mouth daily. 90 tablet 0  . clotrimazole (MYCELEX) 10 MG troche Take 1 tablet (10 mg total) by mouth 3 (three) times daily. 140 Troche 1  . Cyanocobalamin (VITAMIN B-12 CR) 1500 MCG TBCR Take 1 tablet (1,500 mcg total) by mouth daily. 90 tablet 1  . dexlansoprazole (DEXILANT) 60 MG capsule Take 1 capsule (60 mg total) by mouth daily. 90 capsule 0  . gabapentin (NEURONTIN) 100 MG capsule Take 1 capsule by mouth twice daily.  180 capsule 0  . glucose blood (ACCU-CHEK GUIDE) test strip Use to check blood sugars  five times daily. 500 each 2  . hydrALAZINE (APRESOLINE) 25 MG tablet Take 1 tablet (25 mg total) by mouth in the morning and at bedtime. 180 tablet 0  . insulin glargine, 1 Unit Dial, (TOUJEO SOLOSTAR) 300 UNIT/ML Solostar Pen Inject 50 Units into the skin every morning. And pen needles 1/day 9 mL 1  . insulin lispro (HUMALOG KWIKPEN) 200 UNIT/ML KwikPen Inject 10 Units into the skin 3 (three) times daily with meals as needed. 9 mL 1  . Insulin Pen Needle 32G X 4 MM MISC Use to administer insulin four times a day 360 each 3  . levothyroxine (SYNTHROID) 112 MCG tablet Take 1 tablet (112 mcg total) by mouth daily before breakfast. 90 tablet 3  . loratadine (CLARITIN) 10 MG tablet Take 10 mg by mouth daily.    . metFORMIN (GLUCOPHAGE XR) 750 MG 24 hr tablet Take 2 tablets (1,500 mg total) by mouth daily with breakfast. 180 tablet 1  . Semaglutide, 1 MG/DOSE, (OZEMPIC, 1 MG/DOSE,) 2 MG/1.5ML SOPN Inject 0.1875 mLs (0.25 mg total) into the skin every morning. 9 mL 0  . Azilsartan-Chlorthalidone 40-12.5 MG TABS Take 1 tablet by mouth daily. 90 tablet 0  . nebivolol (BYSTOLIC) 5 MG tablet Take 1 tablet (5 mg total) by mouth daily. 90 tablet 1   No facility-administered medications prior to visit.    ROS  Review of Systems  Constitutional: Negative.  Negative for appetite change, diaphoresis, fatigue and unexpected weight change.  HENT: Negative.   Eyes: Negative for visual disturbance.  Respiratory: Negative for cough, chest tightness, shortness of breath and wheezing.   Cardiovascular: Negative for chest pain, palpitations and leg swelling.  Gastrointestinal: Negative for abdominal pain, constipation, diarrhea, nausea and vomiting.  Endocrine: Negative.   Genitourinary: Negative.  Negative for difficulty urinating, dysuria and hematuria.  Musculoskeletal: Negative.   Skin: Negative.  Negative for color change.  Neurological: Positive for headaches. Negative for dizziness, weakness and  light-headedness.  Hematological: Negative for adenopathy. Does not bruise/bleed easily.  Psychiatric/Behavioral: Negative.     Objective:  BP (!) 176/86   Pulse 67   Temp 98.3 F (36.8 C) (Oral)   Resp 16   Ht 5\' 2"  (1.575 m)   Wt 132 lb (59.9 kg)   SpO2 98%   BMI 24.14 kg/m   BP Readings from Last 3 Encounters:  04/24/20 (!) 176/86  04/09/20 (!) 183/77  04/01/20 (!) 158/66    Wt Readings from Last 3 Encounters:  04/24/20 132 lb (59.9 kg)  04/09/20 129 lb (58.5 kg)  04/01/20 129 lb (58.5 kg)    Physical Exam Vitals reviewed.  Constitutional:      General: She is not in acute distress.    Appearance: Normal appearance. She is not toxic-appearing or diaphoretic.  HENT:     Nose: Nose normal.     Mouth/Throat:     Mouth: Mucous membranes are moist.  Eyes:     General: No scleral icterus.    Extraocular Movements: Extraocular movements intact.     Pupils: Pupils are equal, round, and reactive to light.  Cardiovascular:     Rate and Rhythm: Normal rate and regular rhythm.     Heart sounds: No murmur heard.   Pulmonary:     Effort: Pulmonary effort is normal.     Breath sounds: No stridor. No wheezing, rhonchi or rales.  Abdominal:     General: Abdomen is flat.     Palpations: There is no mass.     Tenderness: There is no abdominal tenderness. There is no guarding.  Musculoskeletal:        General: Normal range of motion.     Cervical back: Neck supple.     Right lower leg: No edema.     Left lower leg: No edema.  Lymphadenopathy:     Cervical: No cervical adenopathy.  Skin:    General: Skin is warm and dry.     Coloration: Skin is not pale.  Neurological:     General: No focal deficit present.     Mental Status: She is alert and oriented to person, place, and time. Mental status is at baseline.  Psychiatric:        Mood and Affect: Mood normal.     Lab Results  Component Value Date   WBC 9.6 04/09/2020   HGB 11.9 (L) 04/09/2020   HCT 35.0 (L)  04/09/2020   PLT 212 04/09/2020   GLUCOSE 162 (H) 04/09/2020   CHOL 181 04/01/2020   TRIG 118 04/01/2020   HDL 63 04/01/2020   LDLDIRECT 141 (H) 03/04/2010   LDLCALC 97 04/01/2020   ALT 25 04/09/2020   AST 18 04/09/2020   NA 141 04/09/2020   K 4.2 04/09/2020   CL 104 04/09/2020   CREATININE 1.20 (H) 04/09/2020   BUN 14 04/09/2020   CO2 24 04/09/2020   TSH  4.06 04/01/2020   INR 1.0 04/09/2020   HGBA1C 10.2 (H) 04/01/2020   MICROALBUR 0.6 04/01/2020    CT HEAD WO CONTRAST  Result Date: 04/09/2020 CLINICAL DATA:  Transient ischemic attack, difficulty getting words out properly, thinking clearly, disorganized, symptoms for 1 week, history of stroke EXAM: CT HEAD WITHOUT CONTRAST TECHNIQUE: Contiguous axial images were obtained from the base of the skull through the vertex without intravenous contrast. Sagittal and coronal MPR images reconstructed from axial data set. COMPARISON:  05/22/2018 FINDINGS: Brain: Mild atrophy. Normal ventricular morphology. No midline shift or mass effect. Small old infarct in LEFT MCA territory near vertex. No intracranial hemorrhage, mass lesion or evidence of acute infarction. No extra-axial fluid collections. Vascular: No hyperdense vessels. Atherosclerotic calcification of internal carotid arteries at skull base. Skull: Intact Sinuses/Orbits: Clear Other: N/A IMPRESSION: Small old LEFT MCA territory infarct. No acute intracranial abnormalities. Electronically Signed   By: Lavonia Dana M.D.   On: 04/09/2020 09:06   MR BRAIN WO CONTRAST  Result Date: 04/09/2020 CLINICAL DATA:  Mental status change of unknown etiology. Thought disturbance. Speech disturbance. Balance disturbance. EXAM: MRI HEAD WITHOUT CONTRAST TECHNIQUE: Multiplanar, multiecho pulse sequences of the brain and surrounding structures were obtained without intravenous contrast. COMPARISON:  Head CT same day.  MRI 06/12/2015. FINDINGS: Brain: Diffusion imaging does not show any acute or subacute  infarction. No focal abnormality affects the cerebellum. Minimal small vessel change of the hemispheric white matter. Old small vessel infarctions of the right thalamus. Old left parietal cortical and subcortical infarction. No mass lesion, hemorrhage, hydrocephalus or extra-axial collection. No sign of widespread small-vessel disease. Vascular: Major vessels at the base of the brain show flow. Skull and upper cervical spine: Negative Sinuses/Orbits: Clear/normal Other: None IMPRESSION: No acute or reversible finding. Old left parietal cortical and subcortical infarction. Old small vessel infarctions right thalamus. Electronically Signed   By: Nelson Chimes M.D.   On: 04/09/2020 14:47    Assessment & Plan:   Masen was seen today for hypertension.  Diagnoses and all orders for this visit:  Essential hypertension, benign- Her blood pressure is not adequately well controlled and she is symptomatic.  I recommended she stay on the current dose of the ARB and to increase the doses of nebivolol and chlorthalidone. -     nebivolol (BYSTOLIC) 10 MG tablet; Take 1 tablet (10 mg total) by mouth daily. -     Azilsartan-Chlorthalidone (EDARBYCLOR) 40-25 MG TABS; Take 1 tablet by mouth daily.  Deficiency anemia- Labs ordered to screen for vitamin deficiencies. -     Vitamin B12; Future -     CBC with Differential/Platelet; Future -     Folate; Future -     Ferritin; Future -     Vitamin B1; Future -     Iron; Future -     Iron -     Vitamin B1 -     Ferritin -     Folate -     CBC with Differential/Platelet -     Vitamin B12  Need for shingles vaccine -     Zoster Vaccine Adjuvanted [facility]) injection; Inject 0.5 mLs into the muscle once for 1 dose.  Other orders -     Flu Vaccine QUAD 6+ mos PF IM (Fluarix Quad PF)   I have discontinued Karen Chafe. Bies's Azilsartan-Chlorthalidone and nebivolol. I am also having her start on nebivolol, Edarbyclor, and Shingrix. Additionally, I am having her maintain  her aspirin, Vitamin B-12 CR, loratadine,  blood glucose meter kit and supplies, levothyroxine, gabapentin, Accu-Chek Guide, Accu-Chek FastClix Lancets, buPROPion, amitriptyline, Dexilant, clotrimazole, Toujeo SoloStar, Insulin Pen Needle, HumaLOG KwikPen, atorvastatin, metFORMIN, Ozempic (1 MG/DOSE), and hydrALAZINE.  Meds ordered this encounter  Medications  . nebivolol (BYSTOLIC) 10 MG tablet    Sig: Take 1 tablet (10 mg total) by mouth daily.    Dispense:  84 tablet    Refill:  0  . Azilsartan-Chlorthalidone (EDARBYCLOR) 40-25 MG TABS    Sig: Take 1 tablet by mouth daily.    Dispense:  42 tablet    Refill:  0  . Zoster Vaccine Adjuvanted [facility]) injection    Sig: Inject 0.5 mLs into the muscle once for 1 dose.    Dispense:  0.5 mL    Refill:  1     Follow-up: Return in about 6 weeks (around 06/05/2020).  Scarlette Calico, MD

## 2020-04-24 NOTE — Patient Instructions (Signed)

## 2020-04-28 LAB — VITAMIN B12: Vitamin B-12: 524 pg/mL (ref 200–1100)

## 2020-04-28 LAB — CBC WITH DIFFERENTIAL/PLATELET
Absolute Monocytes: 756 cells/uL (ref 200–950)
Basophils Absolute: 95 cells/uL (ref 0–200)
Basophils Relative: 0.9 %
Eosinophils Absolute: 305 cells/uL (ref 15–500)
Eosinophils Relative: 2.9 %
HCT: 33.6 % — ABNORMAL LOW (ref 35.0–45.0)
Hemoglobin: 10.8 g/dL — ABNORMAL LOW (ref 11.7–15.5)
Lymphs Abs: 3518 cells/uL (ref 850–3900)
MCH: 28 pg (ref 27.0–33.0)
MCHC: 32.1 g/dL (ref 32.0–36.0)
MCV: 87 fL (ref 80.0–100.0)
MPV: 11.6 fL (ref 7.5–12.5)
Monocytes Relative: 7.2 %
Neutro Abs: 5828 cells/uL (ref 1500–7800)
Neutrophils Relative %: 55.5 %
Platelets: 228 10*3/uL (ref 140–400)
RBC: 3.86 10*6/uL (ref 3.80–5.10)
RDW: 13 % (ref 11.0–15.0)
Total Lymphocyte: 33.5 %
WBC: 10.5 10*3/uL (ref 3.8–10.8)

## 2020-04-28 LAB — IRON: Iron: 61 ug/dL (ref 45–160)

## 2020-04-28 LAB — VITAMIN B1: Vitamin B1 (Thiamine): 48 nmol/L — ABNORMAL HIGH (ref 8–30)

## 2020-04-28 LAB — FOLATE: Folate: 14.2 ng/mL

## 2020-04-28 LAB — FERRITIN: Ferritin: 47 ng/mL (ref 16–288)

## 2020-04-29 NOTE — Addendum Note (Signed)
Addended by: [name] on: 04/29/2020 07:49 AM   Modules accepted: Orders

## 2020-05-06 ENCOUNTER — Other Ambulatory Visit: Payer: Self-pay

## 2020-05-06 ENCOUNTER — Encounter (HOSPITAL_COMMUNITY): Payer: Self-pay | Admitting: Psychiatry

## 2020-05-06 ENCOUNTER — Telehealth (INDEPENDENT_AMBULATORY_CARE_PROVIDER_SITE_OTHER): Payer: Medicare Other | Admitting: Psychiatry

## 2020-05-06 DIAGNOSIS — F331 Major depressive disorder, recurrent, moderate: Secondary | ICD-10-CM

## 2020-05-06 DIAGNOSIS — F411 Generalized anxiety disorder: Secondary | ICD-10-CM

## 2020-05-06 MED ORDER — AMITRIPTYLINE HCL 75 MG PO TABS: 75.0000 mg | ORAL_TABLET | Freq: Every day | ORAL | 0 refills | Status: DC

## 2020-05-06 MED ORDER — BUPROPION HCL ER (XL) 300 MG PO TB24: 300.0000 mg | ORAL_TABLET | Freq: Every day | ORAL | 0 refills | Status: DC

## 2020-05-06 NOTE — Progress Notes (Signed)
Virtual Visit via Telephone Note  I connected with Sonia Skinner on 05/06/20 at  4:00 PM EDT by telephone and verified that I am speaking with the correct person using two identifiers.  Location: Patient: Work Provider: Biomedical scientist   I discussed the limitations, risks, security and privacy concerns of performing an evaluation and management service by telephone and the availability of in person appointments. I also discussed with the patient that there may be a patient responsible charge related to this service. The patient expressed understanding and agreed to proceed.   History of Present Illness: Patient is evaluated by phone session.  She was seen in the emergency room after TIA.  She is doing better.  Her medicines were changed.  She is taking blood pressure medicine and keeping a log of her blood pressure.  On the last visit we increased amitriptyline to help her sleep and she noticed improvement in her anxiety and sleep.  Her husband is doing better and finished her radiation for his prostate cancer.  She admitted sometimes feeling overwhelmed because her husband's family does not support as much.  She has no tremors, shakes or any EPS.  She does not want to change medication since she feel her depression and anxiety is stable.  She denies any crying spells or any feeling of hopelessness.  She is working as a Oceanographer.  Her level is fair.  She denies drinking or using any illegal substances.  PastPsychiatric History:Reviewed. H/Otaking antidepressant on and off most of life.H/O overdose and inpatient at [place] provider [place], Adderall, Zoloft, Celexa, Lamictaland Lexapro.Never tested forADHD. No h/opsychosis, mania or hallucination. In 2001 she moved to [place] and saw psychiatrist andgivenamitriptyline and Wellbutrinto stop smoking.   Psychiatric Specialty Exam: Physical Exam  Review of Systems  Weight 132 lb  (59.9 kg).There is no height or weight on file to calculate BMI.  General Appearance: NA  Eye Contact:  NA  Speech:  Slow  Volume:  Decreased  Mood:  Dysphoric  Affect:  NA  Thought Process:  Descriptions of Associations: Intact  Orientation:  Full (Time, Place, and Person)  Thought Content:  Rumination  Suicidal Thoughts:  No  Homicidal Thoughts:  No  Memory:  Immediate;   Good Recent;   Fair Remote;   Fair  Judgement:  Intact  Insight:  Shallow  Psychomotor Activity:  NA  Concentration:  Concentration: Fair and Attention Span: Fair  Recall:  [employer] of Knowledge:  Fair  Language:  Good  Akathisia:  No  Handed:  Right  AIMS (if indicated):     Assets:  Communication Skills Desire for Improvement Housing Resilience Talents/Skills Transportation  ADL's:  Intact  Cognition:  WNL  Sleep:   ok      Assessment and Plan: Major depressive disorder, recurrent.  Generalized anxiety disorder.  I reviewed blood work results.  She has mild coronal renal insufficiency and anemia.  She is taking Z61 and folic acid.  Encouraged to keep her medicine as prescribed for her health needs.  She like to keep her current psychiatric medication since it is helping her depression and anxiety.  She has no tremors or shakes.  I will continue amitriptyline 75 mg daily and Wellbutrin XL 300 mg daily.  Patient is not interested in therapy.  Recommended to call us back if there is any question or any concern.  Follow-up in 3 months.  Follow Up Instructions:    I  discussed the assessment and treatment plan with the patient. The patient was provided an opportunity to ask questions and all were answered. The patient agreed with the plan and demonstrated an understanding of the instructions.   The patient was advised to call back or seek an in-person evaluation if the symptoms worsen or if the condition fails to improve as anticipated.  I provided 15 minutes of non-face-to-face time during this  encounter.   Kathlee Nations, MD

## 2020-05-07 MED ORDER — OZEMPIC (0.25 OR 0.5 MG/DOSE) 2 MG/1.5ML ~~LOC~~ SOPN: 0.2500 mg | PEN_INJECTOR | Freq: Every morning | SUBCUTANEOUS | 0 refills | Status: DC

## 2020-05-07 NOTE — Addendum Note (Signed)
Addended by: Binnie Rail on: 05/07/2020 10:02 AM   Modules accepted: Orders

## 2020-05-08 ENCOUNTER — Telehealth (HOSPITAL_COMMUNITY): Payer: Medicare Other | Admitting: Psychiatry

## 2020-05-08 ENCOUNTER — Other Ambulatory Visit: Payer: Self-pay | Admitting: *Deleted

## 2020-05-08 NOTE — Patient Outreach (Signed)
[facility]) Care Management  05/08/2020  Sonia Skinner 1951-04-22 [id number]   RN Health CoachMonthlyOutreach  Referral Date:09/27/2018 Referral Source:Primary MD Reason for Referral:Poorly controlled diabetes Insurance:[employer]   Outreach Attempt:  Outreach attempt #1 to patient for follow up.  Patient answered and stated she was still working.  Attempted another outreach after 4 p.m. and no answer. RN Health Coach left HIPAA compliant voicemail message along with contact information.  Plan:  RN Health Coach will make another outreach attempt within the month of October if no return call back from patient.    [place] [phone] [email or web address]

## 2020-05-15 ENCOUNTER — Ambulatory Visit: Payer: Medicare Other | Admitting: Dietician

## 2020-05-26 ENCOUNTER — Other Ambulatory Visit: Payer: Self-pay | Admitting: Internal Medicine

## 2020-05-26 DIAGNOSIS — E118 Type 2 diabetes mellitus with unspecified complications: Secondary | ICD-10-CM

## 2020-05-26 MED ORDER — OZEMPIC (1 MG/DOSE) 4 MG/3ML ~~LOC~~ SOPN: 1.0000 mg | PEN_INJECTOR | SUBCUTANEOUS | 1 refills | Status: DC

## 2020-05-27 ENCOUNTER — Other Ambulatory Visit: Payer: Self-pay | Admitting: *Deleted

## 2020-05-27 NOTE — Patient Outreach (Signed)
[facility]) Care Management  05/27/2020  Sonia Skinner [birth year] [id number]   RN Health CoachMonthlyOutreach  Referral Date:09/27/2018 Referral Source:Primary MD Reason for Referral:Poorly controlled diabetes Insurance:[employer] Medicare   Outreach Attempt:  Outreach attempt #2 to patient for follow up.  Patient answered and stated she was still working.   Plan:  RN Health Coach will make another outreach within the month of November unless patient returns call.   [place] [phone] [email or web address]

## 2020-05-28 ENCOUNTER — Other Ambulatory Visit: Payer: Self-pay | Admitting: Endocrinology

## 2020-05-28 LAB — HM DIABETES EYE EXAM

## 2020-05-31 ENCOUNTER — Other Ambulatory Visit: Payer: Self-pay | Admitting: Endocrinology

## 2020-05-31 DIAGNOSIS — E118 Type 2 diabetes mellitus with unspecified complications: Secondary | ICD-10-CM

## 2020-06-09 DIAGNOSIS — E119 Type 2 diabetes mellitus without complications: Secondary | ICD-10-CM | POA: Diagnosis not present

## 2020-06-20 ENCOUNTER — Telehealth: Payer: Self-pay | Admitting: Internal Medicine

## 2020-06-20 NOTE — Telephone Encounter (Signed)
Pharmacy has been informed that Rx was d/c and Toujeo was prescribed in place of it.

## 2020-06-20 NOTE — Telephone Encounter (Signed)
I do not see this as a current medication in the patients chart. Is it correct that she should not take this?

## 2020-06-20 NOTE — Telephone Encounter (Signed)
    Please confirm if patient should be taking BASAGLAR KWIKPEN 100 UNIT/ML. [employer] requesting refill order  Phone [phone]

## 2020-06-25 ENCOUNTER — Other Ambulatory Visit: Payer: Self-pay | Admitting: Internal Medicine

## 2020-06-25 ENCOUNTER — Other Ambulatory Visit: Payer: Self-pay | Admitting: *Deleted

## 2020-06-25 DIAGNOSIS — B370 Candidal stomatitis: Secondary | ICD-10-CM

## 2020-06-25 DIAGNOSIS — I10 Essential (primary) hypertension: Secondary | ICD-10-CM

## 2020-06-25 NOTE — Patient Outreach (Signed)
[place] [facility]) Care Management  06/25/2020  Sonia Skinner 1951-03-06 [id number]   RN Health CoachMonthlyOutreach  Referral Date:09/27/2018 Referral Source:Primary MD Reason for Referral:Poorly controlled diabetes Insurance:[employer] Medicare   Outreach Attempt:  Outreach attempt #3 to patient for follow up.  Patient answered and stated she was in class working, unable to talk.   Plan:  RN Health Coach will make another outreach attempt within the month of November.   [place] [phone] Vedant [email or web address]

## 2020-07-09 ENCOUNTER — Other Ambulatory Visit: Payer: Self-pay | Admitting: *Deleted

## 2020-07-09 NOTE — Patient Outreach (Signed)
[place] [facility]) Care Management  07/09/2020  Sonia Skinner 11/12/1950 [id number]   RN Health CoachMonthlyOutreach  Referral Date:09/27/2018 Referral Source:Primary MD Reason for Referral:Poorly controlled diabetes Insurance:[employer] Medicare   Outreach Attempt:  Outreach attempt #4 to patient for follow up.   Patient answered and stated "this is not a good time".  RN Health Coach requested call back from patient when she is available to speak.  Plan:  RN Health Coach will make another outreach attempt within the month of December if no return call back from patient.  [place] [phone] [email or web address] [email or web address]

## 2020-07-23 ENCOUNTER — Encounter (HOSPITAL_COMMUNITY): Payer: Self-pay | Admitting: Psychiatry

## 2020-07-23 ENCOUNTER — Telehealth (INDEPENDENT_AMBULATORY_CARE_PROVIDER_SITE_OTHER): Payer: Medicare Other | Admitting: Psychiatry

## 2020-07-23 ENCOUNTER — Other Ambulatory Visit: Payer: Self-pay

## 2020-07-23 DIAGNOSIS — F411 Generalized anxiety disorder: Secondary | ICD-10-CM | POA: Diagnosis not present

## 2020-07-23 DIAGNOSIS — F331 Major depressive disorder, recurrent, moderate: Secondary | ICD-10-CM | POA: Diagnosis not present

## 2020-07-23 MED ORDER — AMITRIPTYLINE HCL 100 MG PO TABS: 100.0000 mg | ORAL_TABLET | Freq: Every day | ORAL | 0 refills | Status: DC

## 2020-07-23 MED ORDER — BUPROPION HCL ER (XL) 300 MG PO TB24: 300.0000 mg | ORAL_TABLET | Freq: Every day | ORAL | 0 refills | Status: DC

## 2020-07-23 NOTE — Progress Notes (Signed)
Virtual Visit via Telephone Note  I connected with Sonia Skinner on 07/23/20 at  4:00 PM EST by telephone and verified that I am speaking with the correct person using two identifiers.  Location: Patient: Home Provider: Office   I discussed the limitations, risks, security and privacy concerns of performing an evaluation and management service by telephone and the availability of in person appointments. I also discussed with the patient that there may be a patient responsible charge related to this service. The patient expressed understanding and agreed to proceed.   History of Present Illness: Patient is evaluated by phone session.  She is on the phone by herself.  Patient reported today is not a good day because today is her birthday and also her brother's death anniversary.  She feels sad and dysphoric.  She admitted lately struggle with sleep, anxiety and racing thoughts.  She is taking amitriptyline and Wellbutrin.  She reported her husband is doing better after radiation of prostate cancer.  She denies any suicidal thoughts or homicidal thought.  She lost weight because she does not eat as much.  Patient reported her job is going okay.  She is working as a Oceanographer.  She denies any paranoia or any hallucination.  She has no tremors, shakes or any EPS.  Sometimes she has trouble with remembering things.  She mentioned not taking B12 because it was not renewed.  PastPsychiatric History:Reviewed. H/Otaking antidepressant on and off most of life.H/O overdose and inpatient at [facility] provider [place], Adderall, Zoloft, Celexa, Lamictaland Lexapro.Never tested forADHD. No h/opsychosis, mania or hallucination. In 2001 she moved to [facility] and saw psychiatrist andgivenamitriptyline and Wellbutrinto stop smoking.   Psychiatric Specialty Exam: Physical Exam  Review of Systems  Weight 125 lb (56.7 kg).Body mass index is  22.86 kg/m.  General Appearance: NA  Eye Contact:  NA  Speech:  Slow  Volume:  Decreased  Mood:  Dysphoric  Affect:  NA  Thought Process:  Descriptions of Associations: Intact  Orientation:  Full (Time, Place, and Person)  Thought Content:  Rumination  Suicidal Thoughts:  No  Homicidal Thoughts:  No  Memory:  Immediate;   Good Recent;   Fair Remote;   Fair  Judgement:  Fair  Insight:  Shallow  Psychomotor Activity:  NA  Concentration:  Concentration: Fair and Attention Span: Fair  Recall:  [employer] of Knowledge:  Good  Language:  Good  Akathisia:  No  Handed:  Right  AIMS (if indicated):     Assets:  Communication Skills Desire for Improvement Housing Transportation  ADL's:  Intact  Cognition:  WNL  Sleep:   fair      Assessment and Plan: Major depressive disorder, recurrent.  Generalized anxiety disorder.  I reviewed her medication.  Patient told she is not taking B12 regularly because it was not renewed by her physician.  I recommend to have her call her physician to clarify.  Patient is taking the medication but sometimes she feels amitriptyline not helping her sleep.  I recommend she can try 100 mg amitriptyline that can help her anxiety, sleep.  Continue Wellbutrin XL 300 mg daily.  Patient is not interested in therapy.  Discussed medication side effects and benefits.  Recommended to call us back if she has any question or any concern.  Follow-up in 3 months.  Follow Up Instructions:    I discussed the assessment and treatment plan with the patient. The patient  was provided an opportunity to ask questions and all were answered. The patient agreed with the plan and demonstrated an understanding of the instructions.   The patient was advised to call back or seek an in-person evaluation if the symptoms worsen or if the condition fails to improve as anticipated.  I provided 17 minutes of non-face-to-face time during this encounter.   Kathlee Nations, MD

## 2020-07-25 ENCOUNTER — Other Ambulatory Visit: Payer: Self-pay | Admitting: Internal Medicine

## 2020-07-25 DIAGNOSIS — B370 Candidal stomatitis: Secondary | ICD-10-CM

## 2020-08-01 ENCOUNTER — Other Ambulatory Visit: Payer: Self-pay | Admitting: *Deleted

## 2020-08-01 ENCOUNTER — Encounter: Payer: Self-pay | Admitting: *Deleted

## 2020-08-01 NOTE — Patient Instructions (Signed)
Goals Addressed              This Visit's Progress   .  [facility]) Learn More About My Health        Timeframe:  Short-Term Goal Priority:  Medium Start Date:   08/01/20                          Expected End Date:   10/13/20                     Follow Up Date 10/13/20   - tell my story and reason for my visit - make a list of questions - ask questions - repeat what I heard to make sure I understand - bring a list of my medicines to the visit - speak up when I don't understand    Why is this important?    The best way to learn about your health and care is by talking to the doctor and nurse.   They will answer your questions and give you information in the way that you like best.    Notes:     .  [facility]) Make and Keep All Appointments        Timeframe:  Long-Range Goal Priority:  High Start Date:  08/01/20                           Expected End Date:  02/12/21                     Follow Up Date 10/13/20    - call to cancel if needed - keep a calendar with appointment dates  -schedule PCP follow up in January 2022   Why is this important?    Part of staying healthy is seeing the doctor for follow-up care.   If you forget your appointments, there are some things you can do to stay on track.    Notes:     .  ([facility]) Monitor and Manage My Blood Sugar-Diabetes Type 2        Timeframe:  Long-Range Goal Priority:  Medium Start Date:   08/01/20                          Expected End Date:   02/12/21                    Follow Up Date 10/13/20   - check blood sugar at prescribed times - check blood sugar before and after exercise - check blood sugar if I feel it is too high or too low - enter blood sugar readings and medication or insulin into daily log - take the blood sugar log to all doctor visits - take the blood sugar meter to all doctor visits    Why is this important?    Checking your blood sugar at home helps to keep it from getting very high or very low.   Writing  the results in a diary or log helps the doctor know how to care for you.   Your blood sugar log should have the time, date and the results.   Also, write down the amount of insulin or other medicine that you take.   Other information, like what you ate, exercise done and how you were feeling, will also be helpful.  Notes:     Marland Kitchen  COMPLETED: [facility]) Patient will report decreasing Hgb A1C by 0.5 points within the next 90 days. (pt-stated)        CARE PLAN ENTRY (see longtitudinal plan of care for additional care plan information)  Objective:  Lab Results  Component Value Date   HGBA1C 10.2 (H) 04/01/2020 .   Lab Results  Component Value Date   CREATININE 1.20 (H) 04/09/2020   CREATININE 1.24 (H) 04/09/2020   CREATININE 1.22 (H) 04/01/2020 .   Marland Kitchen No results found for: EGFR  Current Barriers:  Marland Kitchen Knowledge Deficits related to basic Diabetes pathophysiology and self care/management . Knowledge Deficits related to medications used for management of diabetes  Case Manager Clinical Goal(s):  Over the next 90 days, patient will demonstrate improved adherence to prescribed treatment plan for diabetes self care/management as evidenced by: decreasing Hgb A1C by 0.5 points . Verbalize daily monitoring and recording of CBG within 90 days . Verbalize adherence to ADA/ carb modified diet within the next 90 days . Verbalize adherence to prescribed medication regimen within the next 30 days  . Verbalize obtaining new insulin and other medication refills just prescribed within the next 30 days. . Scheduling eye exam within the next 90 days  Interventions:  . Provided education to patient about basic DM disease process . Reviewed medications with patient and discussed importance of medication adherence . Discussed plans with patient for ongoing care management follow up and provided patient with direct contact information for care management team . Provided patient with written educational  materials related to hypo and hyperglycemia and importance of correct treatment . Reviewed scheduled/upcoming provider appointments including: PCP follow up on 04/24/20 and upcoming Diabetes and Nutrition appointment on 05/15/20 . Advised patient, providing education and rationale, to check cbg at least 3 times a day or as provider prescribed and record, calling primary care provider for findings outside established parameters.   Liana Gerold to assist patient with scheduling eye examination  Patient Self Care Activities:  . Self administers oral medications as prescribed . Self administers insulin as prescribed . Self administers injectable DM medication (Ozempic) as prescribed . Attends all scheduled provider appointments . Checks blood sugars as prescribed and utilize hyper and hypoglycemia protocol as needed . Adheres to prescribed ADA/carb modified . Schedules eye examination . Obtains prescriptions from [place]  Initial goal documentation   Resolving due to duplicate goals    .  [facility]) Set My Target A1C-Diabetes Type 2        Timeframe:  Long-Range Goal Priority:  High Start Date: 08/01/20                            Expected End Date:  02/12/21                     Follow Up Date 10/13/20    - set target A1C ; goal is 7, current is 10.2   Why is this important?    Your target A1C is decided together by you and your doctor.   It is based on several things like your age and other health issues.    Notes:

## 2020-08-01 NOTE — Patient Outreach (Signed)
[facility] [facility]) Care Management  [place]  08/01/2020   Sonia Skinner December 03, 1950 [id number]   RN Health CoachMonthlyOutreach  Referral Date:09/27/2018 Referral Source:Primary MD Reason for Referral:Poorly controlled diabetes Insurance:[employer] Medicare   Outreach Attempt:  Successful telephone outreach to patient for follow up.  HIPAA verified with patient.  Patient stating she is doing well.  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  States she has been compliant with taking her long acting insulin, but not taking her meal coverage due to blood sugars.  Did not check blood sugar today, but reports recent ranges of 90-120's.  Denies any episodes of hypo or hyperglycemia.  Denies any recent sick days or falls.  Discussed with patient I will be changing positions and she will be contacted by another Health Coach in the future and patient continues to agree to future outreaches.  Encounter Medications:  Outpatient Encounter Medications as of 08/01/2020  Medication Sig Note  . amitriptyline (ELAVIL) 100 MG tablet Take 1 tablet (100 mg total) by mouth at bedtime.   Marland Kitchen aspirin 325 MG tablet Take 1 tablet (325 mg total) by mouth daily.   Marland Kitchen atorvastatin (LIPITOR) 40 MG tablet Take 1 tablet (40 mg total) by mouth daily.   Marland Kitchen buPROPion (WELLBUTRIN XL) 300 MG 24 hr tablet Take 1 tablet (300 mg total) by mouth daily.   . Cholecalciferol (VITAMIN D) 50 MCG (2000 UT) CAPS Take by mouth daily.   . clotrimazole (MYCELEX) 10 MG troche Dissolve 1 troche by mouth three times daily.   Marland Kitchen DEXILANT 60 MG capsule Take 1 capsule by mouth daily.   Marland Kitchen EDARBYCLOR 40-12.5 MG TABS Take 1 tablet by mouth daily.   Marland Kitchen gabapentin (NEURONTIN) 100 MG capsule Take 1 capsule by mouth twice daily.    . hydrALAZINE (APRESOLINE) 25 MG tablet Take 1 tablet by mouth in the morning and at bedtime.   . insulin glargine, 1 Unit Dial, (TOUJEO  SOLOSTAR) 300 UNIT/ML Solostar Pen Inject 50 Units into the skin every morning. And pen needles 1/day 04/14/2020: Have not picked up from pharmacy  . levothyroxine (SYNTHROID) 112 MCG tablet Take 1 tablet by mouth daily before breakfast.   . loratadine (CLARITIN) 10 MG tablet Take 10 mg by mouth daily. 08/01/2020: Takes as needed  . metFORMIN (GLUCOPHAGE XR) 750 MG 24 hr tablet Take 2 tablets (1,500 mg total) by mouth daily with breakfast.   . nebivolol (BYSTOLIC) 10 MG tablet Take 1 tablet (10 mg total) by mouth daily.   . Semaglutide, 1 MG/DOSE, (OZEMPIC, 1 MG/DOSE,) 4 MG/3ML SOPN Inject 1 mg into the skin once a week.   . Accu-Chek FastClix Lancets MISC Use to check blood sugar five times daily.   . Azilsartan-Chlorthalidone (EDARBYCLOR) 40-25 MG TABS Take 1 tablet by mouth daily. 20/94/7096: duplicate  . blood glucose meter kit and supplies KIT Use to test blood sugars   . Cyanocobalamin (VITAMIN B-12 CR) 1500 MCG TBCR Take 1 tablet (1,500 mcg total) by mouth daily. (Patient not taking: Reported on 08/01/2020) 08/01/2020: Reports not taking  . glucose blood (ACCU-CHEK GUIDE) test strip Use to check blood sugars five times daily.   . insulin lispro (HUMALOG KWIKPEN) 200 UNIT/ML KwikPen Inject 10 Units into the skin 3 (three) times daily with meals as needed. (Patient not taking: Reported on 08/01/2020) 08/01/2020: Reports not taking  . Insulin Pen Needle 32G X 4 MM MISC Use to administer insulin four times  a day   . [DISCONTINUED] clotrimazole (MYCELEX) 10 MG troche Take 1 tablet (10 mg total) by mouth 3 (three) times daily.   . [DISCONTINUED] DEXILANT 60 MG capsule Take 1 capsule by mouth daily.   . [DISCONTINUED] DEXILANT 60 MG capsule Take 1 capsule by mouth daily.   . [DISCONTINUED] dexlansoprazole (DEXILANT) 60 MG capsule Take 1 capsule (60 mg total) by mouth daily.   . [DISCONTINUED] glucose blood (ACCU-CHEK GUIDE) test strip Use to check blood sugars five times a day   . [DISCONTINUED]  Lancets Thin MISC Use to check blood sugar five times daily. DX: E11.8   . [DISCONTINUED] SYNJARDY 12-998 MG TABS Take 1 tablet by mouth twice daily.  **Office visit due**    No facility-administered encounter medications on file as of 08/01/2020.    Functional Status:  No flowsheet data found.  Fall/Depression Screening: Fall Risk  08/01/2020 04/14/2020 11/19/2019  Falls in the past year? 0 0 0  Number falls in past yr: 0 0 0  Injury with Fall? 0 0 0  Risk for fall due to : Impaired vision;Medication side effect Impaired vision;Medication side effect Impaired vision;Medication side effect  Follow up Falls evaluation completed;Education provided;Falls prevention discussed Falls evaluation completed;Education provided;Falls prevention discussed Falls evaluation completed;Education provided;Falls prevention discussed   PHQ 2/9 Scores 04/01/2020 04/06/2019 11/10/2018 09/27/2018 07/26/2018 06/21/2018 02/20/2018  PHQ - 2 Score 2 1 0 0 $R'4 4 6  'Zn$ PHQ- 9 Score 6 4 - - - 17 14    Goals Addressed              This Visit's Progress   .  [facility]) Learn More About My Health        Timeframe:  Short-Term Goal Priority:  Medium Start Date:   08/01/20                          Expected End Date:   10/13/20                     Follow Up Date 10/13/20   - tell my story and reason for my visit - make a list of questions - ask questions - repeat what I heard to make sure I understand - bring a list of my medicines to the visit - speak up when I don't understand    Why is this important?    The best way to learn about your health and care is by talking to the doctor and nurse.   They will answer your questions and give you information in the way that you like best.    Notes:     .  [facility]) Make and Keep All Appointments        Timeframe:  Long-Range Goal Priority:  High Start Date:  08/01/20                           Expected End Date:  02/12/21                     Follow Up Date 10/13/20    - call  to cancel if needed - keep a calendar with appointment dates  -schedule PCP follow up in January 2022   Why is this important?    Part of staying healthy is seeing the doctor for follow-up care.   If you forget your appointments, there are some things you  can do to stay on track.    Notes:     .  ([facility]) Monitor and Manage My Blood Sugar-Diabetes Type 2        Timeframe:  Long-Range Goal Priority:  Medium Start Date:   08/01/20                          Expected End Date:   02/12/21                    Follow Up Date 10/13/20   - check blood sugar at prescribed times - check blood sugar before and after exercise - check blood sugar if I feel it is too high or too low - enter blood sugar readings and medication or insulin into daily log - take the blood sugar log to all doctor visits - take the blood sugar meter to all doctor visits    Why is this important?    Checking your blood sugar at home helps to keep it from getting very high or very low.   Writing the results in a diary or log helps the doctor know how to care for you.   Your blood sugar log should have the time, date and the results.   Also, write down the amount of insulin or other medicine that you take.   Other information, like what you ate, exercise done and how you were feeling, will also be helpful.     Notes:     .  COMPLETED: [facility]) Patient will report decreasing Hgb A1C by 0.5 points within the next 90 days. (pt-stated)        CARE PLAN ENTRY (see longtitudinal plan of care for additional care plan information)  Objective:  Lab Results  Component Value Date   HGBA1C 10.2 (H) 04/01/2020 .   Lab Results  Component Value Date   CREATININE 1.20 (H) 04/09/2020   CREATININE 1.24 (H) 04/09/2020   CREATININE 1.22 (H) 04/01/2020 .   Marland Kitchen No results found for: EGFR  Current Barriers:  Marland Kitchen Knowledge Deficits related to basic Diabetes pathophysiology and self care/management . Knowledge Deficits related to  medications used for management of diabetes  Case Manager Clinical Goal(s):  Over the next 90 days, patient will demonstrate improved adherence to prescribed treatment plan for diabetes self care/management as evidenced by: decreasing Hgb A1C by 0.5 points . Verbalize daily monitoring and recording of CBG within 90 days . Verbalize adherence to ADA/ carb modified diet within the next 90 days . Verbalize adherence to prescribed medication regimen within the next 30 days  . Verbalize obtaining new insulin and other medication refills just prescribed within the next 30 days. . Scheduling eye exam within the next 90 days  Interventions:  . Provided education to patient about basic DM disease process . Reviewed medications with patient and discussed importance of medication adherence . Discussed plans with patient for ongoing care management follow up and provided patient with direct contact information for care management team . Provided patient with written educational materials related to hypo and hyperglycemia and importance of correct treatment . Reviewed scheduled/upcoming provider appointments including: PCP follow up on 04/24/20 and upcoming Diabetes and Nutrition appointment on 05/15/20 . Advised patient, providing education and rationale, to check cbg at least 3 times a day or as provider prescribed and record, calling primary care provider for findings outside established parameters.   Memory Argue to assist patient with scheduling eye examination  Patient Self Care Activities:  . Self administers oral medications as prescribed . Self administers insulin as prescribed . Self administers injectable DM medication (Ozempic) as prescribed . Attends all scheduled provider appointments . Checks blood sugars as prescribed and utilize hyper and hypoglycemia protocol as needed . Adheres to prescribed ADA/carb modified . Schedules eye examination . Obtains prescriptions from [place]  Initial goal documentation   Resolving due to duplicate goals    .  [facility]) Set My Target A1C-Diabetes Type 2        Timeframe:  Long-Range Goal Priority:  High Start Date: 08/01/20                            Expected End Date:  02/12/21                     Follow Up Date 10/13/20    - set target A1C ; goal is 7, current is 10.2   Why is this important?    Your target A1C is decided together by you and your doctor.   It is based on several things like your age and other health issues.    Notes:       Appointments:  Last seen by primary care provider in August 2021 and needs to schedule follow up appointment; states she will schedule for January 2022.  Plan: Follow-up:  Patient agrees to Care Plan and Follow-up.  RN Health Coach will send primary care provider quarterly update. RN Health Coach will make next telephone outreach to patient within the month of February agrees to future outreach.   Sonia Skinner Sonia Skinner.Sonia Skinner

## 2020-08-05 ENCOUNTER — Ambulatory Visit: Payer: Self-pay | Admitting: *Deleted

## 2020-08-05 ENCOUNTER — Telehealth (HOSPITAL_COMMUNITY): Payer: Medicare Other | Admitting: Psychiatry

## 2020-08-24 ENCOUNTER — Other Ambulatory Visit: Payer: Self-pay | Admitting: Internal Medicine

## 2020-08-24 DIAGNOSIS — B370 Candidal stomatitis: Secondary | ICD-10-CM

## 2020-08-26 ENCOUNTER — Other Ambulatory Visit: Payer: Self-pay | Admitting: Endocrinology

## 2020-09-23 ENCOUNTER — Other Ambulatory Visit: Payer: Self-pay | Admitting: Internal Medicine

## 2020-09-23 DIAGNOSIS — E118 Type 2 diabetes mellitus with unspecified complications: Secondary | ICD-10-CM

## 2020-09-23 DIAGNOSIS — Z794 Long term (current) use of insulin: Secondary | ICD-10-CM

## 2020-09-25 ENCOUNTER — Other Ambulatory Visit: Payer: Self-pay | Admitting: *Deleted

## 2020-09-25 NOTE — Patient Outreach (Signed)
[place] [facility]) Care Management  09/25/2020  LINDEE LEASON 09-Jul-1951 [id number]  Unsuccessful outreach attempt made to patient. RN Health Coach left HIPAA compliant voicemail message along with her contact information.  Plan: RN Health Coach will call patient within the month of March and will send the patient a Health Coach Letter.   [name] RN, BSN [place] [phone] Sofya [email or web address]

## 2020-09-27 ENCOUNTER — Other Ambulatory Visit: Payer: Self-pay | Admitting: Internal Medicine

## 2020-09-27 DIAGNOSIS — I10 Essential (primary) hypertension: Secondary | ICD-10-CM

## 2020-10-03 ENCOUNTER — Other Ambulatory Visit: Payer: Self-pay | Admitting: Internal Medicine

## 2020-10-03 DIAGNOSIS — B370 Candidal stomatitis: Secondary | ICD-10-CM

## 2020-10-16 ENCOUNTER — Other Ambulatory Visit: Payer: Self-pay

## 2020-10-16 ENCOUNTER — Telehealth (HOSPITAL_COMMUNITY): Payer: Medicare Other | Admitting: Psychiatry

## 2020-10-28 ENCOUNTER — Telehealth (HOSPITAL_COMMUNITY): Payer: Self-pay

## 2020-10-28 NOTE — Telephone Encounter (Signed)
Received a fax from [place] by [employer] requesting a refill on patient's Amitriptyline 100mg  and her Bupropion 300mg  24 hour tablet. Pharmacy tech stated that they're sending out her last refill and want new refills on file. Please review and advise. Thank you

## 2020-10-28 NOTE — Telephone Encounter (Signed)
She missed last appointment and reschedule for 22nd. Please call patient if she completely out than we can provide one week until her next appointment otherwise wait till her appointment.

## 2020-10-29 ENCOUNTER — Telehealth (HOSPITAL_COMMUNITY): Payer: Self-pay

## 2020-10-29 DIAGNOSIS — F411 Generalized anxiety disorder: Secondary | ICD-10-CM

## 2020-10-29 DIAGNOSIS — F331 Major depressive disorder, recurrent, moderate: Secondary | ICD-10-CM

## 2020-10-29 MED ORDER — BUPROPION HCL ER (XL) 300 MG PO TB24: 300.0000 mg | ORAL_TABLET | Freq: Every day | ORAL | 0 refills | Status: DC

## 2020-10-29 MED ORDER — AMITRIPTYLINE HCL 100 MG PO TABS: 100.0000 mg | ORAL_TABLET | Freq: Every day | ORAL | 0 refills | Status: DC

## 2020-10-29 NOTE — Telephone Encounter (Signed)
Spoke with patient regarding her medication. She uses [facility] & pt stated that they do a month ahead which is why they sent the refill requests now. She has an appointment on 3/22 but not with [place]. It's with [name] [facility]. I asked her if she wanted to schedule an appointment but she stated that she will call back in April due to her husband being in the hospital & school still in session. She would still  like the refills sent in. Thank you

## 2020-10-29 NOTE — Telephone Encounter (Signed)
Relayed message to pt - pt didn't pick up, LVM

## 2020-10-29 NOTE — Telephone Encounter (Signed)
I sent her prescription to [facility] but she need to schedule appointment for future refills.

## 2020-11-04 ENCOUNTER — Other Ambulatory Visit: Payer: Self-pay | Admitting: *Deleted

## 2020-11-04 NOTE — Patient Outreach (Signed)
[place] [facility]) Care Management  11/04/2020  USHA SLAGER 1951/04/11 [id number]  Unsuccessful outreach attempt made to patient. RN Health Coach left HIPAA compliant voicemail message along with her contact information.  Plan: RN Health Coach will call patient within the month of April.  [name] RN, BSN [place] [phone] [email or web address]

## 2020-11-07 ENCOUNTER — Other Ambulatory Visit: Payer: Self-pay | Admitting: Internal Medicine

## 2020-11-07 DIAGNOSIS — B370 Candidal stomatitis: Secondary | ICD-10-CM

## 2020-11-13 DIAGNOSIS — H905 Unspecified sensorineural hearing loss: Secondary | ICD-10-CM | POA: Diagnosis not present

## 2020-11-22 ENCOUNTER — Other Ambulatory Visit: Payer: Self-pay | Admitting: Endocrinology

## 2020-11-24 ENCOUNTER — Other Ambulatory Visit: Payer: Self-pay | Admitting: *Deleted

## 2020-11-24 NOTE — Patient Outreach (Signed)
[place] [facility]) Care Management  11/24/2020  Sonia Skinner 1950-08-28 [id number]  Unsuccessful outreach attempt made to patient. Patient answered the phone and stated that she would not be able to speak today. She did request that this nurse call back at a later date.   Plan: RN Health Coach will call patient within the month of May.  [name] RN, BSN [place] [phone] [email or web address]

## 2020-11-27 ENCOUNTER — Other Ambulatory Visit: Payer: Self-pay | Admitting: Internal Medicine

## 2020-11-27 DIAGNOSIS — E118 Type 2 diabetes mellitus with unspecified complications: Secondary | ICD-10-CM

## 2020-11-27 DIAGNOSIS — Z794 Long term (current) use of insulin: Secondary | ICD-10-CM

## 2020-11-27 MED ORDER — FREESTYLE LIBRE 2 READER DEVI: 1.0000 | Freq: Every day | 5 refills | Status: DC

## 2020-11-27 MED ORDER — FREESTYLE LIBRE 2 SENSOR MISC: 1.0000 | Freq: Every day | 5 refills | Status: DC

## 2020-12-07 ENCOUNTER — Other Ambulatory Visit: Payer: Self-pay | Admitting: Internal Medicine

## 2020-12-07 DIAGNOSIS — B370 Candidal stomatitis: Secondary | ICD-10-CM

## 2020-12-26 ENCOUNTER — Other Ambulatory Visit: Payer: Self-pay | Admitting: Internal Medicine

## 2020-12-26 DIAGNOSIS — I10 Essential (primary) hypertension: Secondary | ICD-10-CM

## 2021-01-08 ENCOUNTER — Other Ambulatory Visit: Payer: Self-pay | Admitting: *Deleted

## 2021-01-08 NOTE — Patient Outreach (Signed)
[place] [facility]) Care Management  01/08/2021  Sonia Skinner 12/02/1950 [id number]  Unsuccessful outreach attempt made to patient. RN Health Coach left HIPAA compliant voicemail message along with her contact information.  Plan: RN Health Coach will call patient within the month of June.  [name] RN, BSN [place] [phone] [email or web address]

## 2021-01-22 ENCOUNTER — Other Ambulatory Visit (HOSPITAL_COMMUNITY): Payer: Self-pay | Admitting: Psychiatry

## 2021-01-22 DIAGNOSIS — F331 Major depressive disorder, recurrent, moderate: Secondary | ICD-10-CM

## 2021-01-22 DIAGNOSIS — F411 Generalized anxiety disorder: Secondary | ICD-10-CM

## 2021-01-28 ENCOUNTER — Other Ambulatory Visit: Payer: Self-pay | Admitting: *Deleted

## 2021-01-28 NOTE — Patient Outreach (Signed)
[place] [facility]) Care Management  01/28/2021  Sonia Skinner 1950/09/08 [id number]  Unsuccessful outreach attempt made to patient. RN Health Coach left HIPAA compliant voicemail message along with her contact information.  Plan: RN Health Coach will call patient within the month of July and will send patient an unsuccessful letter.  [name] RN, BSN [place] [phone] [email or web address]

## 2021-02-24 ENCOUNTER — Telehealth: Payer: Self-pay | Admitting: Internal Medicine

## 2021-02-24 ENCOUNTER — Other Ambulatory Visit: Payer: Self-pay | Admitting: Internal Medicine

## 2021-02-24 ENCOUNTER — Other Ambulatory Visit: Payer: Self-pay | Admitting: Endocrinology

## 2021-02-24 DIAGNOSIS — E118 Type 2 diabetes mellitus with unspecified complications: Secondary | ICD-10-CM

## 2021-02-24 DIAGNOSIS — I63512 Cerebral infarction due to unspecified occlusion or stenosis of left middle cerebral artery: Secondary | ICD-10-CM

## 2021-02-24 DIAGNOSIS — E785 Hyperlipidemia, unspecified: Secondary | ICD-10-CM

## 2021-02-24 DIAGNOSIS — Z794 Long term (current) use of insulin: Secondary | ICD-10-CM

## 2021-02-24 NOTE — Telephone Encounter (Signed)
Samantha from [employer] has called on behalf of the patient, stating the patient has been missing Azilsartan-Chlorthalidone (EDARBYCLOR) 40-25 MG TABS  [place] missed putting it in the June and July shipment, patient just needs a refill.   [facility] by [place], [place]

## 2021-02-25 ENCOUNTER — Other Ambulatory Visit: Payer: Self-pay | Admitting: Internal Medicine

## 2021-02-25 NOTE — Telephone Encounter (Signed)
Pt scheduled for 7/18 for follow up

## 2021-03-02 ENCOUNTER — Ambulatory Visit: Payer: Medicare Other | Admitting: Internal Medicine

## 2021-03-09 ENCOUNTER — Other Ambulatory Visit: Payer: Self-pay | Admitting: *Deleted

## 2021-03-09 ENCOUNTER — Encounter: Payer: Self-pay | Admitting: *Deleted

## 2021-03-09 NOTE — Patient Outreach (Deleted)
[place] Skinner [facility]) Care Management  03/09/2021  Sonia Skinner July 22, 1951 [id number]  Unsuccessful outreach attempt made to patient. RN Health Coach left HIPAA compliant voicemail message along with her contact information.  Plan: RN Health Coach will call patient within the month of August.  [name] RN, BSN [place] [phone] [email or web address]

## 2021-03-09 NOTE — Patient Instructions (Signed)
Goals Addressed             This Visit's Progress    [facility]) Make and Keep All Appointments       Timeframe:  Long-Range Goal Priority:  High Start Date:  08/01/20                           Expected End Date:  06/14/21                    Follow Up Date 06/14/21   - call to cancel if needed - keep a calendar with appointment dates  -Discussed patient's upcoming visit with PCP on 03/31/21 -Encouraged patient to call [facility] to ask what dentist is in her network -Encouraged patient to make an appointment with Dr. Adele Schilder to discuss her medication   Why is this important?   Part of staying healthy is seeing the doctor for follow-up care.  If you forget your appointments, there are some things you can do to stay on track.    Notes: Updated 03/09/21: Patient reports having an upcoming visit with her PCP on 03/31/21. Patient states she will call [employer] to see which dentist are part of her network and she will then call to make a dental appointment. Patient explained that she is feeling stressed and depressed. Patient states that she plans to call Dr. Adele Schilder to discuss her medication.      [facility]) Monitor and Manage My Blood Sugar-Diabetes Type 2       Timeframe:  Long-Range Goal Priority:  Medium Start Date:   08/01/20                          Expected End Date:   08/14/21                  Follow Up Date 06/14/21   - check blood sugar at prescribed times - check blood sugar if I feel it is too high or too low - enter blood sugar readings and medication or insulin into daily log - take the blood sugar log to all doctor visits  -Encouraged patient to limit sugar and carbohydrates in her diet -Discussed trying to select healthy foods when eating out as much as possible -Discussed getting a second glucose meter to keep at her husbands home to decrease chances of missing taking her blood sugar -Discussed drinking Glucerna or high protein Ensure to decrease having low blood sugars and to ensure she is  getting enough nutrition  Why is this important?   Checking your blood sugar at home helps to keep it from getting very high or very low.  Writing the results in a diary or log helps the doctor know how to care for you.  Your blood sugar log should have the time, date and the results.  Also, write down the amount of insulin or other medicine that you take.  Other information, like what you ate, exercise done and how you were feeling, will also be helpful.     Notes: Updated 03/09/21: Patient explains that her husband has been ill for a long while and as a result she has not had time to care for herself. Patient reports that he is doing some better and that she plans to start to care for herself. Patient states she is taking her blood sugar several times a week but plans to start to take it at  least daily. Patient states her values have mainly been in the 120's but explains she has at times been as high as 257. Her last A1c was 10.2 on 04/01/20. Discussed getting a second glucose meter to keep at her husbands home to decrease chances of missing taking her blood sugar when she is not at her home. Discussed drinking Glucerna or high protein Ensure to decrease having low blood sugars and to ensure she is getting enough nutrition. Nurse will send patient Ensure coupons and a calendar booklet for her to record her blood sugar values. Patient has an upcoming PCP visit on 03/31/21.     [facility]) Set My Target A1C-Diabetes Type 2       Timeframe:  Long-Range Goal Priority:  High Start Date: 08/01/20                            Expected End Date:  08/14/21                   Follow Up Date 06/14/21    - set target A1C ; goal is 7, current is 10.2   Why is this important?   Your target A1C is decided together by you and your doctor.  It is based on several things like your age and other health issues.    Notes: Patient has an upcoming appointment to see Dr. Ronnald Ramp 03/31/21 and states she is working towards  getting her diabetes under better control.     [facility]) Track and manage My Blood Pressure-Hypertension       Timeframe:  Long-Range Goal Priority:  High Start Date: 03/09/21                            Expected End Date: 03/14/22                     Follow Up Date 06/14/21   - check blood pressure daily - write blood pressure results in a log or diary -Discussed limiting the amount of salt in her diet -Discussed taking time for herself to relax and decrease stress   Why is this important?   You won't feel high blood pressure, but it can still hurt your blood vessels.  High blood pressure can cause heart or kidney problems. It can also cause a stroke.  Making lifestyle changes like losing a little weight or eating less salt will help.  Checking your blood pressure at home and at different times of the day can help to control blood pressure.  If the doctor prescribes medicine remember to take it the way the doctor ordered.  Call the office if you cannot afford the medicine or if there are questions about it.     Notes: 03/09/21: Patient reports that she has not been taking her B/P because she does not have a B/P monitor. Nurse discussed importance of monitoring her B/P and will send patient a B/P monitor, hypertension education, and a calendar booklet to record her values.

## 2021-03-09 NOTE — Patient Outreach (Deleted)
[facility]) Care Management  03/09/2021  Sonia Skinner November 27, 1950 [id number]  Unsuccessful outreach attempt made to patient. RN Health Coach left HIPAA compliant voicemail message along with her contact information.  Plan: RN Health Coach will call patient within the month of August.  [name] RN, BSN [place] [phone] [email or web address]

## 2021-03-09 NOTE — Patient Outreach (Signed)
[facility] [facility]) Care Management  [place]  03/09/2021   CLAUDENE GATLIFF 1951-01-20 [id number]  Subjective: Successful telephone outreach call to patient. HIPAA identifiers obtained. Patient explains that her husband has been ill for a long while and as a result she has not had time to care for herself. Patient reports that he is doing some better and that she plans to start to care for herself. Patient states she is taking her blood sugar several times a week but plans to start to take it at least daily. Patient states her values have mainly been in the 120's but explains she has at times been as high as 257. Her last A1c was 10.2 on 04/01/20. Discussed getting a second glucose meter to keep at her husbands home to decrease chances of missing taking her blood sugar when she is not at her home. Discussed drinking Glucerna or high protein Ensure to decrease having low blood sugars and to ensure she is getting enough nutrition. Nurse will send patient Ensure coupons and a calendar booklet for her to record her blood sugar and B/P values. Patient has an upcoming PCP visit on 03/31/21. Patient reports that she has not been taking her B/P because she does not have a B/P monitor. Nurse discussed importance of monitoring her B/P and will send patient a B/P monitor and hypertension education. Patient explained that she is feeling stressed and depressed. Patient states that she plans to call Dr. Adele Schilder to discuss her medication. Nurse encouraged patient to relax and take time to care for herself now that her husband is doing better. Patient did not have any further questions or concerns today and did confirm that she has this nurse's contact number to call her if needed.   Encounter Medications:  Outpatient Encounter Medications as of 03/09/2021  Medication Sig Note   Accu-Chek FastClix Lancets MISC Use to check blood sugar five times daily.    amitriptyline (ELAVIL) 100 MG tablet Take 1 tablet  (100 mg total) by mouth at bedtime.    aspirin 325 MG tablet Take 1 tablet (325 mg total) by mouth daily.    atorvastatin (LIPITOR) 40 MG tablet Take 1 tablet (40 mg total) by mouth daily.    Azilsartan-Chlorthalidone (EDARBYCLOR) 40-25 MG TABS Take 1 tablet by mouth daily. 56/31/4970: duplicate   blood glucose meter kit and supplies KIT Use to test blood sugars    buPROPion (WELLBUTRIN XL) 300 MG 24 hr tablet Take 1 tablet (300 mg total) by mouth daily.    dexlansoprazole (DEXILANT) 60 MG capsule Take 1 capsule by mouth daily.    EDARBYCLOR 40-12.5 MG TABS Take 1 tablet by mouth daily.    gabapentin (NEURONTIN) 100 MG capsule Take 1 capsule by mouth twice daily.     glucose blood (ACCU-CHEK GUIDE) test strip Use to check blood sugars five times daily.    hydrALAZINE (APRESOLINE) 25 MG tablet Take 1 tablet by mouth in the morning and at bedtime.    insulin glargine, 1 Unit Dial, (TOUJEO SOLOSTAR) 300 UNIT/ML Solostar Pen Inject 50 Units into the skin every morning. And pen needles 1/day 04/14/2020: Have not picked up from pharmacy   insulin lispro (HUMALOG KWIKPEN) 200 UNIT/ML KwikPen Inject 10 Units into the skin 3 (three) times daily with meals as needed. 08/01/2020: Reports not taking   Insulin Pen Needle 32G X 4 MM MISC Use to administer insulin four times a day    levothyroxine (SYNTHROID) 112 MCG tablet Take 1 tablet  by mouth daily before breakfast.    loratadine (CLARITIN) 10 MG tablet Take 10 mg by mouth daily. 08/01/2020: Takes as needed   metFORMIN (GLUCOPHAGE XR) 750 MG 24 hr tablet Take 2 tablets (1,500 mg total) by mouth daily with breakfast.    nebivolol (BYSTOLIC) 5 MG tablet Take 1 tablet by mouth daily.    OZEMPIC, 1 MG/DOSE, 4 MG/3ML SOPN Inject 1 mg subcutaneously once weekly. 03/09/2021: Patient is taking   Cholecalciferol (VITAMIN D) 50 MCG (2000 UT) CAPS Take by mouth daily. (Patient not taking: Reported on 03/09/2021) 03/09/2021: No taking   clotrimazole (MYCELEX) 10 MG troche  Dissolve 1 troche by mouth three times daily. (Patient not taking: Reported on 03/09/2021) 03/09/2021: No taking   Continuous Blood Gluc Receiver (FREESTYLE LIBRE 2 READER) DEVI 1 Act by Does not apply route daily. (Patient not taking: Reported on 03/09/2021) 03/09/2021: No taking   Continuous Blood Gluc Sensor (FREESTYLE LIBRE 2 SENSOR) MISC 1 Act by Does not apply route daily. (Patient not taking: Reported on 03/09/2021) 03/09/2021: No taking   Cyanocobalamin (VITAMIN B-12 CR) 1500 MCG TBCR Take 1 tablet (1,500 mcg total) by mouth daily. (Patient not taking: No sig reported) 03/09/2021: Not taking   nebivolol (BYSTOLIC) 10 MG tablet Take 1 tablet (10 mg total) by mouth daily. (Patient not taking: Reported on 03/09/2021) 03/09/2021: Dose change to 5 mg   No facility-administered encounter medications on file as of 03/09/2021.    Functional Status:  No flowsheet data found.  Fall/Depression Screening: Fall Risk  03/09/2021 08/01/2020 04/14/2020  Falls in the past year? 0 0 0  Number falls in past yr: 0 0 0  Injury with Fall? 0 0 0  Risk for fall due to : Impaired vision;Medication side effect Impaired vision;Medication side effect Impaired vision;Medication side effect  Follow up Falls prevention discussed;Education provided;Falls evaluation completed Falls evaluation completed;Education provided;Falls prevention discussed Falls evaluation completed;Education provided;Falls prevention discussed   PHQ 2/9 Scores 03/09/2021 04/01/2020 04/06/2019 11/10/2018 09/27/2018 07/26/2018 06/21/2018  PHQ - 2 Score $Remov'3 2 1 'SkzBEC$ 0 0 4 4  PHQ- 9 Score $Remov'7 6 4 'IxUKWQ$ - - - 17    Assessment:   Care Plan There are no care plans that you recently modified to display for this patient.    Goals Addressed             This Visit's Progress    [facility]) Make and Keep All Appointments       Timeframe:  Long-Range Goal Priority:  High Start Date:  08/01/20                           Expected End Date:  06/14/21                    Follow  Up Date 06/14/21   - call to cancel if needed - keep a calendar with appointment dates  -Discussed patient's upcoming visit with PCP on 03/31/21 -Encouraged patient to call [facility] to ask what dentist is in her network -Encouraged patient to make an appointment with Dr. Adele Schilder to discuss her medication   Why is this important?   Part of staying healthy is seeing the doctor for follow-up care.  If you forget your appointments, there are some things you can do to stay on track.    Notes: Updated 03/09/21: Patient reports having an upcoming visit with her PCP on 03/31/21. Patient states she will call [employer] to see  which dentist are part of her network and she will then call to make a dental appointment. Patient explained that she is feeling stressed and depressed. Patient states that she plans to call Dr. Adele Schilder to discuss her medication.      [facility]) Monitor and Manage My Blood Sugar-Diabetes Type 2       Timeframe:  Long-Range Goal Priority:  Medium Start Date:   08/01/20                          Expected End Date:   08/14/21                  Follow Up Date 06/14/21   - check blood sugar at prescribed times - check blood sugar if I feel it is too high or too low - enter blood sugar readings and medication or insulin into daily log - take the blood sugar log to all doctor visits  -Encouraged patient to limit sugar and carbohydrates in her diet -Discussed trying to select healthy foods when eating out as much as possible -Discussed getting a second glucose meter to keep at her husbands home to decrease chances of missing taking her blood sugar -Discussed drinking Glucerna or high protein Ensure to decrease having low blood sugars and to ensure she is getting enough nutrition  Why is this important?   Checking your blood sugar at home helps to keep it from getting very high or very low.  Writing the results in a diary or log helps the doctor know how to care for you.  Your blood sugar log should  have the time, date and the results.  Also, write down the amount of insulin or other medicine that you take.  Other information, like what you ate, exercise done and how you were feeling, will also be helpful.     Notes: Updated 03/09/21: Patient explains that her husband has been ill for a long while and as a result she has not had time to care for herself. Patient reports that he is doing some better and that she plans to start to care for herself. Patient states she is taking her blood sugar several times a week but plans to start to take it at least daily. Patient states her values have mainly been in the 120's but explains she has at times been as high as 257. Discussed getting a second glucose meter to keep at her husbands home to decrease chances of missing taking her blood sugar when she is not at her home. Discussed drinking Glucerna or high protein Ensure to decrease having low blood sugars and to ensure she is getting enough nutrition. Nurse will send patient Ensure coupons and a calendar booklet for her to record her blood sugar values.     [facility]) Set My Target A1C-Diabetes Type 2       Timeframe:  Long-Range Goal Priority:  High Start Date: 08/01/20                            Expected End Date:  08/14/21                   Follow Up Date 06/14/21    - set target A1C ; goal is 7, current is 10.2   Why is this important?   Your target A1C is decided together by you and your doctor.  It is based  on several things like your age and other health issues.    Notes: Patient has an upcoming appointment to see Dr. Ronnald Ramp 03/31/21 and states she is working towards getting her diabetes under better control.     [facility]) Track and manage My Blood Pressure-Hypertension       Timeframe:  Long-Range Goal Priority:  High Start Date: 03/09/21                            Expected End Date: 03/14/22                     Follow Up Date 06/14/21   - check blood pressure daily - write blood pressure  results in a log or diary -Discussed limiting the amount of salt in her diet -Discussed taking time for herself to relax and decrease stress   Why is this important?   You won't feel high blood pressure, but it can still hurt your blood vessels.  High blood pressure can cause heart or kidney problems. It can also cause a stroke.  Making lifestyle changes like losing a little weight or eating less salt will help.  Checking your blood pressure at home and at different times of the day can help to control blood pressure.  If the doctor prescribes medicine remember to take it the way the doctor ordered.  Call the office if you cannot afford the medicine or if there are questions about it.     Notes: 03/09/21: Patient reports that she has not been taking her B/P because she does not have a B/P monitor. Nurse discussed importance of monitoring her B/P and will send patient a B/P monitor and a calendar booklet to record her values.       Plan: RN Health Coach will send PCP  today's assessment note, will send patient hypertension education, a calendar booklet, a B/P monitor, and Ensure coupons, and will call patient within the month of August. Follow-up: Patient agrees to Care Plan and Follow-up.  [name] RN, BSN [place] [phone] [email or web address]

## 2021-03-13 ENCOUNTER — Ambulatory Visit: Payer: Self-pay | Admitting: *Deleted

## 2021-03-22 ENCOUNTER — Other Ambulatory Visit: Payer: Self-pay | Admitting: Internal Medicine

## 2021-03-22 DIAGNOSIS — Z794 Long term (current) use of insulin: Secondary | ICD-10-CM

## 2021-03-25 ENCOUNTER — Other Ambulatory Visit: Payer: Self-pay | Admitting: Internal Medicine

## 2021-03-31 ENCOUNTER — Ambulatory Visit: Payer: Medicare Other | Admitting: Internal Medicine

## 2021-04-01 ENCOUNTER — Other Ambulatory Visit: Payer: Self-pay | Admitting: *Deleted

## 2021-04-01 NOTE — Patient Outreach (Signed)
[facility]) Care Management  [place]  04/01/2021   Sonia Skinner [birth year] [id number]  Subjective: Successful telephone outreach call to patient. HIPAA identifiers obtained. Patient did receive the B/P monitor, calendar booklet, Ensure coupons, and hypertension education this nurse sent previously. Patient states she is dong well at this time. Nurse discussed with patient her health goals and her health and wellness needs which were documented in the Epic system. Patient did not have any further questions or concerns today and did confirm that she has this nurse's contact number to call her if needed.   Encounter Medications:  Outpatient Encounter Medications as of 04/01/2021  Medication Sig Note   Accu-Chek FastClix Lancets MISC Use to check blood sugar five times daily.    amitriptyline (ELAVIL) 100 MG tablet Take 1 tablet (100 mg total) by mouth at bedtime.    aspirin 325 MG tablet Take 1 tablet (325 mg total) by mouth daily.    atorvastatin (LIPITOR) 40 MG tablet Take 1 tablet (40 mg total) by mouth daily.    Azilsartan-Chlorthalidone (EDARBYCLOR) 40-25 MG TABS Take 1 tablet by mouth daily. 94/76/5465: duplicate   blood glucose meter kit and supplies KIT Use to test blood sugars    buPROPion (WELLBUTRIN XL) 300 MG 24 hr tablet Take 1 tablet (300 mg total) by mouth daily.    Cholecalciferol (VITAMIN D) 50 MCG (2000 UT) CAPS Take by mouth daily. (Patient not taking: Reported on 03/09/2021) 03/09/2021: No taking   clotrimazole (MYCELEX) 10 MG troche Dissolve 1 troche by mouth three times daily. (Patient not taking: Reported on 03/09/2021) 03/09/2021: No taking   Continuous Blood Gluc Receiver (FREESTYLE LIBRE 2 READER) DEVI 1 Act by Does not apply route daily. (Patient not taking: Reported on 03/09/2021) 03/09/2021: No taking   Continuous Blood Gluc Sensor (FREESTYLE LIBRE 2 SENSOR) MISC 1 Act by Does not apply route daily. (Patient not taking: Reported on 03/09/2021)  03/09/2021: No taking   Cyanocobalamin (VITAMIN B-12 CR) 1500 MCG TBCR Take 1 tablet (1,500 mcg total) by mouth daily. (Patient not taking: No sig reported) 03/09/2021: Not taking   dexlansoprazole (DEXILANT) 60 MG capsule Take 1 capsule by mouth daily.    EDARBYCLOR 40-12.5 MG TABS Take 1 tablet by mouth daily.    gabapentin (NEURONTIN) 100 MG capsule Take 1 capsule by mouth twice daily.     glucose blood (ACCU-CHEK GUIDE) test strip Use to check blood sugars five times daily.    hydrALAZINE (APRESOLINE) 25 MG tablet Take 1 tablet by mouth in the morning and at bedtime.    insulin glargine, 1 Unit Dial, (TOUJEO SOLOSTAR) 300 UNIT/ML Solostar Pen Inject 50 Units into the skin every morning. And pen needles 1/day 04/14/2020: Have not picked up from pharmacy   insulin lispro (HUMALOG KWIKPEN) 200 UNIT/ML KwikPen Inject 10 Units into the skin 3 (three) times daily with meals as needed. 08/01/2020: Reports not taking   Insulin Pen Needle 32G X 4 MM MISC Use to administer insulin four times a day    levothyroxine (SYNTHROID) 112 MCG tablet Take 1 tablet by mouth daily before breakfast.    loratadine (CLARITIN) 10 MG tablet Take 10 mg by mouth daily. 08/01/2020: Takes as needed   metFORMIN (GLUCOPHAGE XR) 750 MG 24 hr tablet Take 2 tablets (1,500 mg total) by mouth daily with breakfast.    nebivolol (BYSTOLIC) 10 MG tablet Take 1 tablet (10 mg total) by mouth daily. (Patient not taking: Reported on 03/09/2021) 03/09/2021: Dose change  to 5 mg   nebivolol (BYSTOLIC) 5 MG tablet Take 1 tablet by mouth daily.    OZEMPIC, 1 MG/DOSE, 4 MG/3ML SOPN Inject 1 mg subcutaneously once weekly. 03/09/2021: Patient is taking   No facility-administered encounter medications on file as of 04/01/2021.    Functional Status:  No flowsheet data found.  Fall/Depression Screening: Fall Risk  03/09/2021 08/01/2020 04/14/2020  Falls in the past year? 0 0 0  Number falls in past yr: 0 0 0  Injury with Fall? 0 0 0  Risk for fall  due to : Impaired vision;Medication side effect Impaired vision;Medication side effect Impaired vision;Medication side effect  Follow up Falls prevention discussed;Education provided;Falls evaluation completed Falls evaluation completed;Education provided;Falls prevention discussed Falls evaluation completed;Education provided;Falls prevention discussed   PHQ 2/9 Scores 03/09/2021 04/01/2020 04/06/2019 11/10/2018 09/27/2018 07/26/2018 06/21/2018  PHQ - 2 Score _0 0 0 4 4  PHQ- 9 Score _1 - - - 17    Assessment:   Care Plan Care Plan : Hypertension (Adult)  Updates made by Michiel Cowboy, RN since 04/01/2021 12:00 AM     Problem: Hypertension (Hypertension)   Priority: Medium     Long-Range Goal: Hypertension Monitored   Start Date: 04/01/2021  Expected End Date: 04/14/2022  Note:   Evidence-based guidance:  Promote initial use of ambulatory blood pressure measurements (for 3 days) to rule out "white-coat" effect; identify masked hypertension and presence or absence of nocturnal "dipping" of blood pressure.   Encourage continued use of home blood pressure monitoring and recording in blood pressure log; include symptoms of hypotension or potential medication side effects in log.  Review blood pressure measurements taken inside and outside of the provider office; establish baseline and monitor trends; compare to target ranges or patient goal.  Share overall cardiovascular risk with patient; encourage changes to lifestyle risk factors, including alcohol consumption, smoking, inadequate exercise, poor dietary habits and stress.   Notes:     Task: Identify and Monitor Blood Pressure Elevation   Due Date: 04/14/2022  Note:   Care Management Activities:    - blood pressure trends reviewed - home or ambulatory blood pressure monitoring encouraged    Notes:       Goals Addressed             This Visit's Progress    [facility]) Make and Keep All Appointments       Timeframe:  Long-Range  Goal Priority:  High Start Date:  08/01/20                           Expected End Date:  06/14/21                    Follow Up Date 07/15/21   - call to cancel if needed - keep a calendar with appointment dates  -Discussed patient's upcoming visit with PCP on 03/31/21 -Encouraged patient to call [facility] to ask what dentist is in her network -Encouraged patient to make an appointment with Dr. Adele Schilder to discuss her medication   Why is this important?   Part of staying healthy is seeing the doctor for follow-up care.  If you forget your appointments, there are some things you can do to stay on track.    Notes: Updated 04/01/21: Patient has an upcoming appointment with her PCP on 04/07/21. Patient states she will call Dr. Adele Schilder next week to discuss her medications.  03/09/21: Patient  reports having an upcoming visit with her PCP on 03/31/21. Patient states she will call [employer] to see which dentist are part of her network and she will then call to make a dental appointment. Patient explained that she is feeling stressed and depressed. Patient states that she plans to call Dr. Adele Schilder to discuss her medication.      [facility]) Monitor and Manage My Blood Sugar-Diabetes Type 2       Timeframe:  Long-Range Goal Priority:  Medium Start Date:   08/01/20                          Expected End Date:   08/14/21                  Follow Up Date 07/15/21   - check blood sugar at prescribed times - check blood sugar if I feel it is too high or too low - enter blood sugar readings and medication or insulin into daily log - take the blood sugar log to all doctor visits  -Encouraged patient to limit sugar and carbohydrates in her diet -Discussed trying to select healthy foods when eating out as much as possible -Discussed getting a second glucose meter to keep at her husbands home to decrease chances of missing taking her blood sugar -Discussed drinking Glucerna or high protein Ensure to decrease having low blood  sugars and to ensure she is getting enough nutrition  Why is this important?   Checking your blood sugar at home helps to keep it from getting very high or very low.  Writing the results in a diary or log helps the doctor know how to care for you.  Your blood sugar log should have the time, date and the results.  Also, write down the amount of insulin or other medicine that you take.  Other information, like what you ate, exercise done and how you were feeling, will also be helpful.     Notes: Updated 04/01/21: Patient states that her blood sugar has been good recently. She does have a second glucometer that she is using when she is at her husband's home. Patient states she has an upcoming visit with her PCP and will have A1c lab draw at that time.   03/09/21: Patient explains that her husband has been ill for a long while and as a result she has not had time to care for herself. Patient reports that he is doing some better and that she plans to start to care for herself. Patient states she is taking her blood sugar several times a week but plans to start to take it at least daily. Patient states her values have mainly been in the 120's but explains she has at times been as high as 257. Her last A1c was 10.2 on 04/01/20. Discussed getting a second glucose meter to keep at her husbands home to decrease chances of missing taking her blood sugar when she is not at her home. Discussed drinking Glucerna or high protein Ensure to decrease having low blood sugars and to ensure she is getting enough nutrition. Nurse will send patient Ensure coupons and a calendar booklet for her to record her blood sugar values. Patient has an upcoming PCP visit on 03/31/21.     [facility]) Set My Target A1C-Diabetes Type 2       Timeframe:  Long-Range Goal Priority:  High Start Date: 08/01/20  Expected End Date:  08/14/21                   Follow Up Date 06/14/21    - set target A1C ; goal is 7, current  is 10.2   Why is this important?   Your target A1C is decided together by you and your doctor.  It is based on several things like your age and other health issues.    Notes:04/01/21 Patient has an upcoming appointment to see Dr. Ronnald Ramp 04/07/21 and states she is working towards getting her diabetes under better control.     [facility]) Track and manage My Blood Pressure-Hypertension       Timeframe:  Long-Range Goal Priority:  High Start Date: 03/09/21                            Expected End Date: 03/14/22                     Follow Up Date 07/15/21   - check blood pressure daily - write blood pressure results in a log or diary -Discussed limiting the amount of salt in her diet -Discussed taking time for herself to relax and decrease stress   Why is this important?   You won't feel high blood pressure, but it can still hurt your blood vessels.  High blood pressure can cause heart or kidney problems. It can also cause a stroke.  Making lifestyle changes like losing a little weight or eating less salt will help.  Checking your blood pressure at home and at different times of the day can help to control blood pressure.  If the doctor prescribes medicine remember to take it the way the doctor ordered.  Call the office if you cannot afford the medicine or if there are questions about it.     Notes: 04/01/21: Patient states that she did receive the hypertension education, calendar booklet, and B/P monitor. Patient reports that she has begun to take her B/P routinely and explains her values are good. She is not at home and cannot share her values. Patient has an upcoming visit with Dr. Ronnald Ramp on 04/07/21.  03/09/21: Patient reports that she has not been taking her B/P because she does not have a B/P monitor. Nurse discussed importance of monitoring her B/P and will send patient a B/P monitor, hypertension education, and a calendar booklet to record her values.       Plan: RN Health Coach will call  patient within the month of November. Follow-up: Patient agrees to Care Plan and Follow-up.  [name] RN, BSN [place] [phone] [email or web address]

## 2021-04-01 NOTE — Patient Instructions (Addendum)
Goals Addressed             This Visit's Progress    [facility]) Make and Keep All Appointments       Timeframe:  Long-Range Goal Priority:  High Start Date:  08/01/20                           Expected End Date:  06/14/21                    Follow Up Date 07/15/21   - call to cancel if needed - keep a calendar with appointment dates  -Discussed patient's upcoming visit with PCP on 03/31/21 -Encouraged patient to call [facility] to ask what dentist is in her network -Encouraged patient to make an appointment with Dr. Adele Schilder to discuss her medication   Why is this important?   Part of staying healthy is seeing the doctor for follow-up care.  If you forget your appointments, there are some things you can do to stay on track.    Notes: Updated 04/01/21: Patient has an upcoming appointment with her PCP on 04/07/21. Patient states she will call Dr. Adele Schilder next week to discuss her medications.  03/09/21: Patient reports having an upcoming visit with her PCP on 03/31/21. Patient states she will call [employer] to see which dentist are part of her network and she will then call to make a dental appointment. Patient explained that she is feeling stressed and depressed. Patient states that she plans to call Dr. Adele Schilder to discuss her medication.      [facility]) Monitor and Manage My Blood Sugar-Diabetes Type 2       Timeframe:  Long-Range Goal Priority:  Medium Start Date:   08/01/20                          Expected End Date:   08/14/21                  Follow Up Date 07/15/21   - check blood sugar at prescribed times - check blood sugar if I feel it is too high or too low - enter blood sugar readings and medication or insulin into daily log - take the blood sugar log to all doctor visits  -Encouraged patient to limit sugar and carbohydrates in her diet -Discussed trying to select healthy foods when eating out as much as possible -Discussed getting a second glucose meter to keep at her husbands home to decrease  chances of missing taking her blood sugar -Discussed drinking Glucerna or high protein Ensure to decrease having low blood sugars and to ensure she is getting enough nutrition  Why is this important?   Checking your blood sugar at home helps to keep it from getting very high or very low.  Writing the results in a diary or log helps the doctor know how to care for you.  Your blood sugar log should have the time, date and the results.  Also, write down the amount of insulin or other medicine that you take.  Other information, like what you ate, exercise done and how you were feeling, will also be helpful.     Notes: Updated 04/01/21: Patient states that her blood sugar has been good recently. She does have a second glucometer that she is using when she is at her husband's home. Patient states she has an upcoming visit with her PCP and  will have A1c lab draw at that time.   03/09/21: Patient explains that her husband has been ill for a long while and as a result she has not had time to care for herself. Patient reports that he is doing some better and that she plans to start to care for herself. Patient states she is taking her blood sugar several times a week but plans to start to take it at least daily. Patient states her values have mainly been in the 120's but explains she has at times been as high as 257. Her last A1c was 10.2 on 04/01/20. Discussed getting a second glucose meter to keep at her husbands home to decrease chances of missing taking her blood sugar when she is not at her home. Discussed drinking Glucerna or high protein Ensure to decrease having low blood sugars and to ensure she is getting enough nutrition. Nurse will send patient Ensure coupons and a calendar booklet for her to record her blood sugar values. Patient has an upcoming PCP visit on 03/31/21.     [facility]) Set My Target A1C-Diabetes Type 2       Timeframe:  Long-Range Goal Priority:  High Start Date: 08/01/20                             Expected End Date:  08/14/21                   Follow Up Date 06/14/21    - set target A1C ; goal is 7, current is 10.2   Why is this important?   Your target A1C is decided together by you and your doctor.  It is based on several things like your age and other health issues.    Notes:04/01/21 Patient has an upcoming appointment to see Dr. Ronnald Ramp 04/07/21 and states she is working towards getting her diabetes under better control.     [facility]) Track and manage My Blood Pressure-Hypertension       Timeframe:  Long-Range Goal Priority:  High Start Date: 03/09/21                            Expected End Date: 03/14/22                     Follow Up Date 07/15/21   - check blood pressure daily - write blood pressure results in a log or diary -Discussed limiting the amount of salt in her diet -Discussed taking time for herself to relax and decrease stress   Why is this important?   You won't feel high blood pressure, but it can still hurt your blood vessels.  High blood pressure can cause heart or kidney problems. It can also cause a stroke.  Making lifestyle changes like losing a little weight or eating less salt will help.  Checking your blood pressure at home and at different times of the day can help to control blood pressure.  If the doctor prescribes medicine remember to take it the way the doctor ordered.  Call the office if you cannot afford the medicine or if there are questions about it.     Notes: 04/01/21: Patient states that she did receive the hypertension education, calendar booklet, and B/P monitor. Patient reports that she has begun to take her B/P routinely and explains her values are good. She is not at home and  cannot share her values. Patient has an upcoming visit with Dr. Ronnald Ramp on 04/07/21.  03/09/21: Patient reports that she has not been taking her B/P because she does not have a B/P monitor. Nurse discussed importance of monitoring her B/P and will send patient a B/P  monitor, hypertension education, and a calendar booklet to record her values.

## 2021-04-07 ENCOUNTER — Encounter: Payer: Self-pay | Admitting: Internal Medicine

## 2021-04-07 ENCOUNTER — Other Ambulatory Visit: Payer: Self-pay

## 2021-04-07 ENCOUNTER — Ambulatory Visit (INDEPENDENT_AMBULATORY_CARE_PROVIDER_SITE_OTHER): Payer: Medicare Other | Admitting: Internal Medicine

## 2021-04-07 VITALS — BP 162/68 | HR 74 | Temp 98.7°F | Ht 62.0 in | Wt 129.8 lb

## 2021-04-07 DIAGNOSIS — E785 Hyperlipidemia, unspecified: Secondary | ICD-10-CM | POA: Diagnosis not present

## 2021-04-07 DIAGNOSIS — Z0001 Encounter for general adult medical examination with abnormal findings: Secondary | ICD-10-CM

## 2021-04-07 DIAGNOSIS — N1832 Chronic kidney disease, stage 3b: Secondary | ICD-10-CM | POA: Diagnosis not present

## 2021-04-07 DIAGNOSIS — I63512 Cerebral infarction due to unspecified occlusion or stenosis of left middle cerebral artery: Secondary | ICD-10-CM

## 2021-04-07 DIAGNOSIS — I10 Essential (primary) hypertension: Secondary | ICD-10-CM | POA: Diagnosis not present

## 2021-04-07 DIAGNOSIS — E559 Vitamin D deficiency, unspecified: Secondary | ICD-10-CM

## 2021-04-07 DIAGNOSIS — E039 Hypothyroidism, unspecified: Secondary | ICD-10-CM

## 2021-04-07 DIAGNOSIS — E118 Type 2 diabetes mellitus with unspecified complications: Secondary | ICD-10-CM | POA: Diagnosis not present

## 2021-04-07 DIAGNOSIS — Z1231 Encounter for screening mammogram for malignant neoplasm of breast: Secondary | ICD-10-CM

## 2021-04-07 DIAGNOSIS — Z794 Long term (current) use of insulin: Secondary | ICD-10-CM

## 2021-04-07 NOTE — Patient Instructions (Signed)
Health Maintenance, Female Adopting a healthy lifestyle and getting preventive care are important in promoting health and wellness. Ask your health care provider about: The right schedule for you to have regular tests and exams. Things you can do on your own to prevent diseases and keep yourself healthy. What should I know about diet, weight, and exercise? Eat a healthy diet  Eat a diet that includes plenty of vegetables, fruits, low-fat dairy products, and lean protein. Do not eat a lot of foods that are high in solid fats, added sugars, or sodium.  Maintain a healthy weight Body mass index (BMI) is used to identify weight problems. It estimates body fat based on height and weight. Your health care provider can help determineyour BMI and help you achieve or maintain a healthy weight. Get regular exercise Get regular exercise. This is one of the most important things you can do for your health. Most adults should: Exercise for at least 150 minutes each week. The exercise should increase your heart rate and make you sweat (moderate-intensity exercise). Do strengthening exercises at least twice a week. This is in addition to the moderate-intensity exercise. Spend less time sitting. Even light physical activity can be beneficial. Watch cholesterol and blood lipids Have your blood tested for lipids and cholesterol at 72 years of age, then havethis test every 5 years. Have your cholesterol levels checked more often if: Your lipid or cholesterol levels are high. You are older than 72 years of age. You are at high risk for heart disease. What should I know about cancer screening? Depending on your health history and family history, you may need to have cancer screening at various ages. This may include screening for: Breast cancer. Cervical cancer. Colorectal cancer. Skin cancer. Lung cancer. What should I know about heart disease, diabetes, and high blood pressure? Blood pressure and heart  disease High blood pressure causes heart disease and increases the risk of stroke. This is more likely to develop in people who have high blood pressure readings, are of African descent, or are overweight. Have your blood pressure checked: Every 3-5 years if you are 18-39 years of age. Every year if you are 40 years old or older. Diabetes Have regular diabetes screenings. This checks your fasting blood sugar level. Have the screening done: Once every three years after age 40 if you are at a normal weight and have a low risk for diabetes. More often and at a younger age if you are overweight or have a high risk for diabetes. What should I know about preventing infection? Hepatitis B If you have a higher risk for hepatitis B, you should be screened for this virus. Talk with your health care provider to find out if you are at risk forhepatitis B infection. Hepatitis C Testing is recommended for: Everyone born from 1945 through 1965. Anyone with known risk factors for hepatitis C. Sexually transmitted infections (STIs) Get screened for STIs, including gonorrhea and chlamydia, if: You are sexually active and are younger than 72 years of age. You are older than 72 years of age and your health care provider tells you that you are at risk for this type of infection. Your sexual activity has changed since you were last screened, and you are at increased risk for chlamydia or gonorrhea. Ask your health care provider if you are at risk. Ask your health care provider about whether you are at high risk for HIV. Your health care provider may recommend a prescription medicine to help   prevent HIV infection. If you choose to take medicine to prevent HIV, you should first get tested for HIV. You should then be tested every 3 months for as long as you are taking the medicine. Pregnancy If you are about to stop having your period (premenopausal) and you may become pregnant, seek counseling before you get  pregnant. Take 400 to 800 micrograms (mcg) of folic acid every day if you become pregnant. Ask for birth control (contraception) if you want to prevent pregnancy. Osteoporosis and menopause Osteoporosis is a disease in which the bones lose minerals and strength with aging. This can result in bone fractures. If you are 65 years old or older, or if you are at risk for osteoporosis and fractures, ask your health care provider if you should: Be screened for bone loss. Take a calcium or vitamin D supplement to lower your risk of fractures. Be given hormone replacement therapy (HRT) to treat symptoms of menopause. Follow these instructions at home: Lifestyle Do not use any products that contain nicotine or tobacco, such as cigarettes, e-cigarettes, and chewing tobacco. If you need help quitting, ask your health care provider. Do not use street drugs. Do not share needles. Ask your health care provider for help if you need support or information about quitting drugs. Alcohol use Do not drink alcohol if: Your health care provider tells you not to drink. You are pregnant, may be pregnant, or are planning to become pregnant. If you drink alcohol: Limit how much you use to 0-1 drink a day. Limit intake if you are breastfeeding. Be aware of how much alcohol is in your drink. In the [place] one drink equals one 12 oz bottle of beer (355 mL), one 5 oz glass of wine (148 mL), or one 1 oz glass of hard liquor (44 mL). General instructions Schedule regular health, dental, and eye exams. Stay current with your vaccines. Tell your health care provider if: You often feel depressed. You have ever been abused or do not feel safe at home. Summary Adopting a healthy lifestyle and getting preventive care are important in promoting health and wellness. Follow your health care provider's instructions about healthy diet, exercising, and getting tested or screened for diseases. Follow your health care provider's  instructions on monitoring your cholesterol and blood pressure. This information is not intended to replace advice given to you by your health care provider. Make sure you discuss any questions you have with your healthcare provider. Document Revised: 07/26/2018 Document Reviewed: 07/26/2018 Elsevier Patient Education  2022 Elsevier Inc.  

## 2021-04-07 NOTE — Progress Notes (Signed)
Subjective:  Patient ID: Sonia Skinner, female    DOB: [birth year]  Age: 70 y.o. MRN: [id number]  CC: Annual Exam, Anemia, Diabetes, Hypertension, Hyperlipidemia, and Hypothyroidism  This visit occurred during the SARS-CoV-2 public health emergency.  Safety protocols were in place, including screening questions prior to the visit, additional usage of staff PPE, and extensive cleaning of exam room while observing appropriate contact time as indicated for disinfecting solutions.    HPI OTHELLA SLAPPEY presents for a CPX and f/up-  She admits that she has been taking care of her husband recently and not herself.  She is very confused about her medications and is not sure what she is or is not taking.  She is aware that she recently has not received some medicines from pill pack.  She is active and denies any recent episodes of chest pain, shortness of breath, palpitations, or edema.  Outpatient Medications Prior to Visit  Medication Sig Dispense Refill   Accu-Chek FastClix Lancets MISC Use to check blood sugar five times daily. 510 each 2   amitriptyline (ELAVIL) 100 MG tablet Take 1 tablet (100 mg total) by mouth at bedtime. 90 tablet 0   aspirin 325 MG tablet Take 1 tablet (325 mg total) by mouth daily.     blood glucose meter kit and supplies KIT Use to test blood sugars 1 each 0   buPROPion (WELLBUTRIN XL) 300 MG 24 hr tablet Take 1 tablet (300 mg total) by mouth daily. 90 tablet 0   Cyanocobalamin (VITAMIN B-12 CR) 1500 MCG TBCR Take 1 tablet (1,500 mcg total) by mouth daily. 90 tablet 1   dexlansoprazole (DEXILANT) 60 MG capsule Take 1 capsule by mouth daily. 90 capsule 0   gabapentin (NEURONTIN) 100 MG capsule Take 1 capsule by mouth twice daily.  180 capsule 0   glucose blood (ACCU-CHEK GUIDE) test strip Use to check blood sugars five times daily. 500 each 2   hydrALAZINE (APRESOLINE) 25 MG tablet Take 1 tablet by mouth in the morning and at bedtime. 180 tablet 0   insulin lispro (HUMALOG  KWIKPEN) 200 UNIT/ML KwikPen Inject 10 Units into the skin 3 (three) times daily with meals as needed. 9 mL 1   Insulin Pen Needle 32G X 4 MM MISC Use to administer insulin four times a day 360 each 3   loratadine (CLARITIN) 10 MG tablet Take 10 mg by mouth daily.     atorvastatin (LIPITOR) 40 MG tablet Take 1 tablet (40 mg total) by mouth daily. 90 tablet 1   Azilsartan-Chlorthalidone (EDARBYCLOR) 40-25 MG TABS Take 1 tablet by mouth daily. 42 tablet 0   EDARBYCLOR 40-12.5 MG TABS Take 1 tablet by mouth daily. 90 tablet 0   levothyroxine (SYNTHROID) 112 MCG tablet Take 1 tablet by mouth daily before breakfast. 90 tablet 0   metFORMIN (GLUCOPHAGE XR) 750 MG 24 hr tablet Take 2 tablets (1,500 mg total) by mouth daily with breakfast. 180 tablet 1   nebivolol (BYSTOLIC) 10 MG tablet Take 1 tablet (10 mg total) by mouth daily. 84 tablet 0   nebivolol (BYSTOLIC) 5 MG tablet Take 1 tablet by mouth daily. 90 tablet 1   OZEMPIC, 1 MG/DOSE, 4 MG/3ML SOPN Inject 1 mg subcutaneously once weekly. 9 mL 1   Continuous Blood Gluc Receiver (FREESTYLE LIBRE 2 READER) DEVI 1 Act by Does not apply route daily. (Patient not taking: No sig reported) 2 each 5   Continuous Blood Gluc Sensor (FREESTYLE LIBRE 2 SENSOR) MISC  1 Act by Does not apply route daily. (Patient not taking: No sig reported) 2 each 5   insulin glargine, 1 Unit Dial, (TOUJEO SOLOSTAR) 300 UNIT/ML Solostar Pen Inject 50 Units into the skin every morning. And pen needles 1/day (Patient not taking: Reported on 04/07/2021) 9 mL 1   Cholecalciferol (VITAMIN D) 50 MCG (2000 UT) CAPS Take by mouth daily. (Patient not taking: No sig reported)     clotrimazole (MYCELEX) 10 MG troche Dissolve 1 troche by mouth three times daily. (Patient not taking: No sig reported) 140 Troche 0   No facility-administered medications prior to visit.    ROS Review of Systems  Constitutional:  Positive for fatigue. Negative for appetite change, chills, diaphoresis and  unexpected weight change.  HENT: Negative.    Eyes: Negative.   Respiratory:  Negative for cough, chest tightness, shortness of breath and wheezing.   Cardiovascular:  Negative for chest pain, palpitations and leg swelling.  Gastrointestinal:  Negative for abdominal pain, constipation, diarrhea and vomiting.  Endocrine: Negative.   Genitourinary: Negative.  Negative for difficulty urinating and dysuria.  Musculoskeletal:  Negative for arthralgias and myalgias.  Skin: Negative.   Neurological: Negative.  Negative for dizziness, weakness, light-headedness and headaches.  Hematological:  Negative for adenopathy. Does not bruise/bleed easily.  Psychiatric/Behavioral: Negative.     Objective:  BP (!) 162/68 (BP Location: Right Arm, Patient Position: Sitting, Cuff Size: Normal)   Pulse 74   Temp 98.7 F (37.1 C) (Oral)   Ht _0  (1.575 m)   Wt 129 lb 12.8 oz (58.9 kg)   SpO2 99%   BMI 23.74 kg/m   BP Readings from Last 3 Encounters:  04/07/21 (!) 162/68  04/24/20 (!) 176/86  04/09/20 (!) 183/77    Wt Readings from Last 3 Encounters:  04/07/21 129 lb 12.8 oz (58.9 kg)  04/24/20 132 lb (59.9 kg)  04/09/20 129 lb (58.5 kg)    Physical Exam Vitals reviewed.  Constitutional:      Appearance: Normal appearance.  HENT:     Nose: Nose normal.     Mouth/Throat:     Mouth: Mucous membranes are moist.  Eyes:     General: No scleral icterus.    Conjunctiva/sclera: Conjunctivae normal.  Cardiovascular:     Rate and Rhythm: Normal rate and regular rhythm.     Heart sounds: No murmur heard. Pulmonary:     Effort: Pulmonary effort is normal.     Breath sounds: No stridor. No wheezing, rhonchi or rales.  Abdominal:     General: Abdomen is flat.     Palpations: There is no mass.     Tenderness: There is no abdominal tenderness. There is no guarding.     Hernia: No hernia is present.  Musculoskeletal:        General: Normal range of motion.     Cervical back: Neck supple.      Right lower leg: No edema.     Left lower leg: No edema.  Lymphadenopathy:     Cervical: No cervical adenopathy.  Skin:    General: Skin is warm and dry.  Neurological:     General: No focal deficit present.     Mental Status: She is alert.  Psychiatric:        Mood and Affect: Mood normal.        Behavior: Behavior normal.    Lab Results  Component Value Date   WBC 10.8 (H) 04/07/2021   HGB 11.0 (L) 04/07/2021  HCT 34.3 (L) 04/07/2021   PLT 231.0 04/07/2021   GLUCOSE 95 04/07/2021   CHOL 239 (H) 04/07/2021   TRIG 169.0 (H) 04/07/2021   HDL 56.40 04/07/2021   LDLDIRECT 141 (H) 03/04/2010   LDLCALC 149 (H) 04/07/2021   ALT 10 04/07/2021   AST 12 04/07/2021   NA 141 04/07/2021   K 4.8 04/07/2021   CL 104 04/07/2021   CREATININE 1.35 (H) 04/07/2021   BUN 14 04/07/2021   CO2 27 04/07/2021   TSH 33.15 (H) 04/07/2021   INR 1.0 04/09/2020   HGBA1C 7.2 (H) 04/07/2021   MICROALBUR <0.7 04/07/2021    CT HEAD WO CONTRAST  Result Date: 04/09/2020 CLINICAL DATA:  Transient ischemic attack, difficulty getting words out properly, thinking clearly, disorganized, symptoms for 1 week, history of stroke EXAM: CT HEAD WITHOUT CONTRAST TECHNIQUE: Contiguous axial images were obtained from the base of the skull through the vertex without intravenous contrast. Sagittal and coronal MPR images reconstructed from axial data set. COMPARISON:  05/22/2018 FINDINGS: Brain: Mild atrophy. Normal ventricular morphology. No midline shift or mass effect. Small old infarct in LEFT MCA territory near vertex. No intracranial hemorrhage, mass lesion or evidence of acute infarction. No extra-axial fluid collections. Vascular: No hyperdense vessels. Atherosclerotic calcification of internal carotid arteries at skull base. Skull: Intact Sinuses/Orbits: Clear Other: N/A IMPRESSION: Small old LEFT MCA territory infarct. No acute intracranial abnormalities. Electronically Signed   By: Lavonia Dana M.D.   On: 04/09/2020  09:06   MR BRAIN WO CONTRAST  Result Date: 04/09/2020 CLINICAL DATA:  Mental status change of unknown etiology. Thought disturbance. Speech disturbance. Balance disturbance. EXAM: MRI HEAD WITHOUT CONTRAST TECHNIQUE: Multiplanar, multiecho pulse sequences of the brain and surrounding structures were obtained without intravenous contrast. COMPARISON:  Head CT same day.  MRI 06/12/2015. FINDINGS: Brain: Diffusion imaging does not show any acute or subacute infarction. No focal abnormality affects the cerebellum. Minimal small vessel change of the hemispheric white matter. Old small vessel infarctions of the right thalamus. Old left parietal cortical and subcortical infarction. No mass lesion, hemorrhage, hydrocephalus or extra-axial collection. No sign of widespread small-vessel disease. Vascular: Major vessels at the base of the brain show flow. Skull and upper cervical spine: Negative Sinuses/Orbits: Clear/normal Other: None IMPRESSION: No acute or reversible finding. Old left parietal cortical and subcortical infarction. Old small vessel infarctions right thalamus. Electronically Signed   By: Nelson Chimes M.D.   On: 04/09/2020 14:47    Assessment & Plan:   Jazell was seen today for annual exam, anemia, diabetes, hypertension, hyperlipidemia and hypothyroidism.  Diagnoses and all orders for this visit:  Essential hypertension, benign- Her blood pressure is not adequately well controlled.  Will restart the ARB and thiazide diuretic. -     CBC with Differential/Platelet; Future -     Basic metabolic panel; Future -     Basic metabolic panel -     CBC with Differential/Platelet -     Azilsartan-Chlorthalidone (EDARBYCLOR) 40-12.5 MG TABS; Take 1 tablet by mouth daily.  Acquired hypothyroidism- Her TSH is elevated at 33.  Will restart levothyroxine. -     TSH; Future -     TSH -     levothyroxine (SYNTHROID) 112 MCG tablet; Take 1 tablet (112 mcg total) by mouth daily before breakfast.  Type 2  diabetes mellitus with complication, with long-term current use of insulin ([place])- Her GFR is down to 30.  Will discontinue metformin.  Will try to control her blood sugar with  an SGLT2 inhibitor and GLP-1 agonist. -     Microalbumin / creatinine urine ratio; Future -     Hemoglobin A1c; Future -     Hemoglobin A1c -     Microalbumin / creatinine urine ratio -     HM Diabetes Foot Exam -     Azilsartan-Chlorthalidone (EDARBYCLOR) 40-12.5 MG TABS; Take 1 tablet by mouth daily. -     dapagliflozin propanediol (FARXIGA) 10 MG TABS tablet; Take 1 tablet (10 mg total) by mouth daily before breakfast. -     Semaglutide, 2 MG/DOSE, 8 MG/3ML SOPN; Inject 2 mg as directed once a week.  Stage 3b chronic kidney disease ([place])- Will try to get better control of her blood pressure.  Will start taking Farxiga. -     Microalbumin / creatinine urine ratio; Future -     Urinalysis, Routine w reflex microscopic; Future -     Urinalysis, Routine w reflex microscopic -     Microalbumin / creatinine urine ratio -     dapagliflozin propanediol (FARXIGA) 10 MG TABS tablet; Take 1 tablet (10 mg total) by mouth daily before breakfast.  Hyperlipidemia with target LDL less than 70- She has not achieved her LDL goal.  Will restart the statin. -     Lipid panel; Future -     Hepatic function panel; Future -     Hepatic function panel -     Lipid panel -     atorvastatin (LIPITOR) 40 MG tablet; Take 1 tablet (40 mg total) by mouth daily.  Vitamin D deficiency- Her vit D is normal now. -     VITAMIN D 25 Hydroxy (Vit-D Deficiency, Fractures); Future -     VITAMIN D 25 Hydroxy (Vit-D Deficiency, Fractures)  Visit for screening mammogram -     MM DIGITAL SCREENING BILATERAL; Future  Cerebrovascular accident (CVA) due to stenosis of left middle cerebral artery (HCC) -     atorvastatin (LIPITOR) 40 MG tablet; Take 1 tablet (40 mg total) by mouth daily.  Type II diabetes mellitus with manifestations ([place])  I have  discontinued Karen Chafe. Renda's metFORMIN, nebivolol, Edarbyclor, Vitamin D, Ozempic (1 MG/DOSE), nebivolol, and clotrimazole. I have also changed her levothyroxine and Edarbyclor. Additionally, I am having her start on dapagliflozin propanediol and Semaglutide (2 MG/DOSE). Lastly, I am having her maintain her aspirin, Vitamin B-12 CR, loratadine, blood glucose meter kit and supplies, gabapentin, Accu-Chek Guide, Accu-Chek FastClix Lancets, Toujeo SoloStar, Insulin Pen Needle, Huma[employer] KwikPen, dexlansoprazole, hydrALAZINE, amitriptyline, buPROPion, FreeStyle Libre 2 Reader, [employer] 2 Sensor, and atorvastatin.  Meds ordered this encounter  Medications   atorvastatin (LIPITOR) 40 MG tablet    Sig: Take 1 tablet (40 mg total) by mouth daily.    Dispense:  90 tablet    Refill:  1   levothyroxine (SYNTHROID) 112 MCG tablet    Sig: Take 1 tablet (112 mcg total) by mouth daily before breakfast.    Dispense:  90 tablet    Refill:  0   Azilsartan-Chlorthalidone (EDARBYCLOR) 40-12.5 MG TABS    Sig: Take 1 tablet by mouth daily.    Dispense:  90 tablet    Refill:  0   dapagliflozin propanediol (FARXIGA) 10 MG TABS tablet    Sig: Take 1 tablet (10 mg total) by mouth daily before breakfast.    Dispense:  90 tablet    Refill:  1   Semaglutide, 2 MG/DOSE, 8 MG/3ML SOPN    Sig: Inject 2 mg as  directed once a week.    Dispense:  9 mL    Refill:  1      Follow-up: Return in about 3 months (around 07/08/2021).  Scarlette Calico, MD

## 2021-04-08 DIAGNOSIS — Z0001 Encounter for general adult medical examination with abnormal findings: Secondary | ICD-10-CM | POA: Insufficient documentation

## 2021-04-08 LAB — URINALYSIS, ROUTINE W REFLEX MICROSCOPIC
Bilirubin Urine: NEGATIVE
Hgb urine dipstick: NEGATIVE
Ketones, ur: NEGATIVE
Leukocytes,Ua: NEGATIVE
Nitrite: NEGATIVE
RBC / HPF: NONE SEEN (ref 0–?)
Specific Gravity, Urine: 1.01 (ref 1.000–1.030)
Total Protein, Urine: NEGATIVE
Urine Glucose: NEGATIVE
Urobilinogen, UA: 0.2 (ref 0.0–1.0)
pH: 6 (ref 5.0–8.0)

## 2021-04-08 LAB — CBC WITH DIFFERENTIAL/PLATELET
Basophils Absolute: 0.1 10*3/uL (ref 0.0–0.1)
Basophils Relative: 0.8 % (ref 0.0–3.0)
Eosinophils Absolute: 0.2 10*3/uL (ref 0.0–0.7)
Eosinophils Relative: 2 % (ref 0.0–5.0)
HCT: 34.3 % — ABNORMAL LOW (ref 36.0–46.0)
Hemoglobin: 11 g/dL — ABNORMAL LOW (ref 12.0–15.0)
Lymphocytes Relative: 35.5 % (ref 12.0–46.0)
Lymphs Abs: 3.8 10*3/uL (ref 0.7–4.0)
MCHC: 31.9 g/dL (ref 30.0–36.0)
MCV: 87.2 fl (ref 78.0–100.0)
Monocytes Absolute: 1 10*3/uL (ref 0.1–1.0)
Monocytes Relative: 9.5 % (ref 3.0–12.0)
Neutro Abs: 5.6 10*3/uL (ref 1.4–7.7)
Neutrophils Relative %: 52.2 % (ref 43.0–77.0)
Platelets: 231 10*3/uL (ref 150.0–400.0)
RBC: 3.94 Mil/uL (ref 3.87–5.11)
RDW: 13.3 % (ref 11.5–15.5)
WBC: 10.8 10*3/uL — ABNORMAL HIGH (ref 4.0–10.5)

## 2021-04-08 LAB — LIPID PANEL
Cholesterol: 239 mg/dL — ABNORMAL HIGH (ref 0–200)
HDL: 56.4 mg/dL (ref >39.00)
LDL Cholesterol: 149 mg/dL — ABNORMAL HIGH (ref 0–99)
NonHDL: 182.61
Total CHOL/HDL Ratio: 4
Triglycerides: 169 mg/dL — ABNORMAL HIGH (ref 0.0–149.0)
VLDL: 33.8 mg/dL (ref 0.0–40.0)

## 2021-04-08 LAB — TSH: TSH: 33.15 u[IU]/mL — ABNORMAL HIGH (ref 0.35–5.50)

## 2021-04-08 LAB — BASIC METABOLIC PANEL
BUN: 14 mg/dL (ref 6–23)
CO2: 27 mEq/L (ref 19–32)
Calcium: 9.8 mg/dL (ref 8.4–10.5)
Chloride: 104 mEq/L (ref 96–112)
Creatinine, Ser: 1.35 mg/dL — ABNORMAL HIGH (ref 0.40–1.20)
GFR: 39.48 mL/min — ABNORMAL LOW (ref >60.00)
Glucose, Bld: 95 mg/dL (ref 70–99)
Potassium: 4.8 mEq/L (ref 3.5–5.1)
Sodium: 141 mEq/L (ref 135–145)

## 2021-04-08 LAB — MICROALBUMIN / CREATININE URINE RATIO
Creatinine,U: 62.5 mg/dL
Microalb Creat Ratio: 1.1 mg/g (ref 0.0–30.0)
Microalb, Ur: <0.7 mg/dL (ref 0.0–1.9)

## 2021-04-08 LAB — HEPATIC FUNCTION PANEL
ALT: 10 U/L (ref 0–35)
AST: 12 U/L (ref 0–37)
Albumin: 4.1 g/dL (ref 3.5–5.2)
Alkaline Phosphatase: 91 U/L (ref 39–117)
Bilirubin, Direct: 0 mg/dL (ref 0.0–0.3)
Total Bilirubin: 0.3 mg/dL (ref 0.2–1.2)
Total Protein: 7 g/dL (ref 6.0–8.3)

## 2021-04-08 LAB — VITAMIN D 25 HYDROXY (VIT D DEFICIENCY, FRACTURES): VITD: 62.1 ng/mL (ref 30.00–100.00)

## 2021-04-08 LAB — HEMOGLOBIN A1C: Hgb A1c MFr Bld: 7.2 % — ABNORMAL HIGH (ref 4.6–6.5)

## 2021-04-08 MED ORDER — ATORVASTATIN CALCIUM 40 MG PO TABS: 40.0000 mg | ORAL_TABLET | Freq: Every day | ORAL | 1 refills | Status: DC

## 2021-04-08 MED ORDER — EDARBYCLOR 40-12.5 MG PO TABS: 1.0000 | ORAL_TABLET | Freq: Every day | ORAL | 0 refills | Status: DC

## 2021-04-08 MED ORDER — SEMAGLUTIDE (2 MG/DOSE) 8 MG/3ML ~~LOC~~ SOPN: 2.0000 mg | PEN_INJECTOR | SUBCUTANEOUS | 1 refills | Status: DC

## 2021-04-08 MED ORDER — LEVOTHYROXINE SODIUM 112 MCG PO TABS: 112.0000 ug | ORAL_TABLET | Freq: Every day | ORAL | 0 refills | Status: DC

## 2021-04-08 MED ORDER — DAPAGLIFLOZIN PROPANEDIOL 10 MG PO TABS: 10.0000 mg | ORAL_TABLET | Freq: Every day | ORAL | 1 refills | Status: DC

## 2021-04-16 ENCOUNTER — Telehealth: Payer: Self-pay | Admitting: Internal Medicine

## 2021-04-16 NOTE — Telephone Encounter (Signed)
Left message for patient to call me back at [phone] to schedule Medicare Annual Wellness Visit   Last AWV  04/06/19  Please schedule at anytime with [facility] if patient calls the office back.    40 Minutes appointment   Any questions, please call me at [phone]

## 2021-04-20 ENCOUNTER — Telehealth: Payer: Self-pay | Admitting: Internal Medicine

## 2021-04-21 ENCOUNTER — Other Ambulatory Visit: Payer: Self-pay

## 2021-04-21 DIAGNOSIS — E118 Type 2 diabetes mellitus with unspecified complications: Secondary | ICD-10-CM

## 2021-04-21 DIAGNOSIS — Z794 Long term (current) use of insulin: Secondary | ICD-10-CM

## 2021-04-21 MED ORDER — LORATADINE 10 MG PO TABS: 10.0000 mg | ORAL_TABLET | Freq: Every day | ORAL | 0 refills | Status: DC

## 2021-04-21 MED ORDER — ACCU-CHEK FASTCLIX LANCETS MISC
2 refills | Status: AC
Start: 2021-04-21 — End: ?

## 2021-04-21 MED ORDER — DEXLANSOPRAZOLE 60 MG PO CPDR: 1.0000 | DELAYED_RELEASE_CAPSULE | Freq: Every day | ORAL | 0 refills | Status: DC

## 2021-04-21 MED ORDER — ACCU-CHEK GUIDE VI STRP: ORAL_STRIP | 2 refills | Status: DC

## 2021-04-21 MED ORDER — TOUJEO SOLOSTAR 300 UNIT/ML ~~LOC~~ SOPN: 50.0000 [IU] | PEN_INJECTOR | SUBCUTANEOUS | 1 refills | Status: DC

## 2021-04-21 NOTE — Progress Notes (Signed)
Per 8/23 OV Ozempic was D/C'd

## 2021-05-05 ENCOUNTER — Telehealth (INDEPENDENT_AMBULATORY_CARE_PROVIDER_SITE_OTHER): Payer: Medicare Other | Admitting: Psychiatry

## 2021-05-05 ENCOUNTER — Encounter (HOSPITAL_COMMUNITY): Payer: Self-pay | Admitting: Psychiatry

## 2021-05-05 ENCOUNTER — Other Ambulatory Visit: Payer: Self-pay

## 2021-05-05 DIAGNOSIS — F411 Generalized anxiety disorder: Secondary | ICD-10-CM

## 2021-05-05 DIAGNOSIS — F331 Major depressive disorder, recurrent, moderate: Secondary | ICD-10-CM

## 2021-05-05 MED ORDER — BUPROPION HCL ER (XL) 300 MG PO TB24: 300.0000 mg | ORAL_TABLET | Freq: Every day | ORAL | 0 refills | Status: DC

## 2021-05-05 MED ORDER — AMITRIPTYLINE HCL 100 MG PO TABS: 100.0000 mg | ORAL_TABLET | Freq: Every day | ORAL | 0 refills | Status: DC

## 2021-05-05 NOTE — Progress Notes (Signed)
Virtual Visit via Telephone Note  I connected with Sonia Skinner on 05/05/21 at  4:20 PM EDT by telephone and verified that I am speaking with the correct person using two identifiers.  Location: Patient: Home Provider: Home Office   I discussed the limitations, risks, security and privacy concerns of performing an evaluation and management service by telephone and the availability of in person appointments. I also discussed with the patient that there may be a patient responsible charge related to this service. The patient expressed understanding and agreed to proceed.   History of Present Illness: Patient is evaluated by phone session.  She was last evaluated in December 2021.  She has been out of medication for amitriptyline for past 2 months and a struggle with insomnia, anxiety.  It may her husband had a amputation and she was busy taking care of him.  Now husband has prosthesis and trying to use the prosthesis.  Patient reported her job is going okay.  She is working as a Oceanographer.  She denies any paranoia, hallucination but admitted sometimes anxious and nervous.  She has no tremor or shakes or any EPS.  She recalls when she was taking the amitriptyline she was sleeping much better.  Her appetite is okay.  Her weight is stable.  She wants to resume her medication.  Past Psychiatric History: Reviewed. H/O taking antidepressant on and off most of life. H/O overdose and inpatient at [place] provider at [facility] and given the Strattera, Adderall, Zoloft, Celexa, Lamictal and Lexapro.  Never tested for ADHD. No h/o psychosis, mania or hallucination.  In 2001 she moved to [place] [facility] and saw psychiatrist and given amitriptyline and Wellbutrin to stop smoking.   Psychiatric Specialty Exam: Physical Exam  Review of Systems  Weight 129 lb (58.5 kg).There is no height or weight on file to calculate BMI.  General Appearance: NA  Eye Contact:  NA  Speech:  Slow   Volume:  Decreased  Mood:  Dysphoric  Affect:  NA  Thought Process:  Descriptions of Associations: Intact  Orientation:  Full (Time, Place, and Person)  Thought Content:  Rumination  Suicidal Thoughts:  No  Homicidal Thoughts:  No  Memory:  Immediate;   Good Recent;   Good Remote;   Fair  Judgement:  Fair  Insight:  Shallow  Psychomotor Activity:  NA  Concentration:  Concentration: Fair and Attention Span: Fair  Recall:  [employer] of Knowledge:  Good  Language:  Good  Akathisia:  No  Handed:  Right  AIMS (if indicated):     Assets:  Communication Skills Desire for Improvement Housing Resilience  ADL's:  Intact  Cognition:  WNL  Sleep:   fair      Assessment and Plan: Major depressive disorder, recurrent.  Generalized anxiety disorder.  Discussed noncompliance with follow-up and medication.  Patient promised to keep the appointment.  We will resume amitriptyline 100 mg at bedtime and Wellbutrin XL 300 mg daily.  Recommended to call us back if she is any question or any concern.  Follow-up in 3 months.  Follow Up Instructions:    I discussed the assessment and treatment plan with the patient. The patient was provided an opportunity to ask questions and all were answered. The patient agreed with the plan and demonstrated an understanding of the instructions.   The patient was advised to call back or seek an in-person evaluation if the symptoms worsen or if the condition fails to improve  as anticipated.  I provided 15 minutes of non-face-to-face time during this encounter.   Kathlee Nations, MD

## 2021-05-26 ENCOUNTER — Other Ambulatory Visit: Payer: Self-pay | Admitting: Internal Medicine

## 2021-06-04 LAB — HM DIABETES EYE EXAM

## 2021-06-18 ENCOUNTER — Other Ambulatory Visit: Payer: Self-pay

## 2021-06-18 ENCOUNTER — Ambulatory Visit
Admission: RE | Admit: 2021-06-18 | Discharge: 2021-06-18 | Disposition: A | Discharge status: Home / Self Care | Payer: Medicare Other | Source: Ambulatory Visit | Attending: Internal Medicine | Admitting: Internal Medicine

## 2021-06-18 DIAGNOSIS — Z1231 Encounter for screening mammogram for malignant neoplasm of breast: Secondary | ICD-10-CM | POA: Diagnosis not present

## 2021-06-20 ENCOUNTER — Other Ambulatory Visit: Payer: Self-pay | Admitting: Internal Medicine

## 2021-06-20 DIAGNOSIS — E118 Type 2 diabetes mellitus with unspecified complications: Secondary | ICD-10-CM

## 2021-06-20 DIAGNOSIS — Z794 Long term (current) use of insulin: Secondary | ICD-10-CM

## 2021-06-23 ENCOUNTER — Other Ambulatory Visit: Payer: Self-pay | Admitting: *Deleted

## 2021-06-23 NOTE — Patient Outreach (Signed)
[facility]) Care Management  06/23/2021  Sonia Skinner 1951-04-29 [id number]  Unsuccessful outreach attempt made to patient. RN Health Coach left HIPAA compliant voicemail message along with her contact information.  Plan: RN Health Coach will call patient within the month of December.  [name] RN, BSN [place] [phone] [name]

## 2021-06-25 ENCOUNTER — Other Ambulatory Visit: Payer: Self-pay | Admitting: Internal Medicine

## 2021-06-25 DIAGNOSIS — I10 Essential (primary) hypertension: Secondary | ICD-10-CM

## 2021-06-25 DIAGNOSIS — E039 Hypothyroidism, unspecified: Secondary | ICD-10-CM

## 2021-06-25 DIAGNOSIS — E118 Type 2 diabetes mellitus with unspecified complications: Secondary | ICD-10-CM

## 2021-06-25 DIAGNOSIS — Z794 Long term (current) use of insulin: Secondary | ICD-10-CM

## 2021-07-23 ENCOUNTER — Other Ambulatory Visit: Payer: Self-pay

## 2021-07-23 ENCOUNTER — Encounter (HOSPITAL_COMMUNITY): Payer: Self-pay | Admitting: Psychiatry

## 2021-07-23 ENCOUNTER — Telehealth (HOSPITAL_BASED_OUTPATIENT_CLINIC_OR_DEPARTMENT_OTHER): Payer: Medicare Other | Admitting: Psychiatry

## 2021-07-23 DIAGNOSIS — F411 Generalized anxiety disorder: Secondary | ICD-10-CM

## 2021-07-23 DIAGNOSIS — F331 Major depressive disorder, recurrent, moderate: Secondary | ICD-10-CM

## 2021-07-23 MED ORDER — BUPROPION HCL ER (XL) 300 MG PO TB24: 300.0000 mg | ORAL_TABLET | Freq: Every day | ORAL | 0 refills | Status: DC

## 2021-07-23 MED ORDER — AMITRIPTYLINE HCL 100 MG PO TABS: 100.0000 mg | ORAL_TABLET | Freq: Every day | ORAL | 0 refills | Status: DC

## 2021-07-23 NOTE — Progress Notes (Signed)
Virtual Visit via Telephone Note  I connected with Sonia Skinner on 07/23/21 at  4:20 PM EST by telephone and verified that I am speaking with the correct person using two identifiers.  Location: Patient: Home Provider: Office   I discussed the limitations, risks, security and privacy concerns of performing an evaluation and management service by telephone and the availability of in person appointments. I also discussed with the patient that there may be a patient responsible charge related to this service. The patient expressed understanding and agreed to proceed.   History of Present Illness: Patient is evaluated by phone session.  She reported not able to get her prescription and upset about refills.  Her prescription was given on September 20 but patient endorsed she did not give enough pills.  It was given a 90-day supply.  Patient reported since not taking the medication she feels sometimes anxious, irritable, depressed and not able to sleep very well.  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  She denies any paranoia or any hallucination.  She admitted energy level is low but denies any suicidal thoughts.  She has no tremor or shakes or any EPS.  Her appetite is okay and her weight is unchanged from the past.   Past Psychiatric History: Reviewed. H/O taking antidepressant on and off most of life. H/O overdose and inpatient [facility] and given the Strattera, Adderall, Zoloft, Celexa, Lamictal and Lexapro.  Never tested for ADHD. No h/o psychosis, mania or hallucination.  In 2001 she moved to [facility] and saw psychiatrist and given amitriptyline and Wellbutrin to stop smoking.   Psychiatric Specialty Exam: Physical Exam  Review of Systems  Weight 129 lb (58.5 kg).There is no height or weight on file to calculate BMI.  General Appearance: NA  Eye Contact:  NA  Speech:  Slow  Volume:  Normal  Mood:  Anxious, Depressed,  and Irritable  Affect:  NA  Thought Process:  Goal Directed  Orientation:  Full (Time, Place, and Person)  Thought Content:  Rumination  Suicidal Thoughts:  No  Homicidal Thoughts:  No  Memory:  Immediate;   Good Recent;   Good Remote;   Fair  Judgement:  Fair  Insight:  Shallow  Psychomotor Activity:  NA  Concentration:  Concentration: Good and Attention Span: Fair  Recall:  Good  Fund of Knowledge:  Good  Language:  Good  Akathisia:  No  Handed:  Right  AIMS (if indicated):     Assets:  Communication Skills Desire for Improvement Housing Transportation  ADL's:  Intact  Cognition:  WNL  Sleep:   poor     Assessment and Plan: Major depressive disorder, recurrent.  Generalized anxiety disorder.  Patient has a history of noncompliance with medication.  I encouraged she should consider local pharmacy as patient has trouble getting her medication and also not taking the medication on time.  Patient agree to call the prescription at her local pharmacy.  I encouraged to call us back if she still have issues getting her medication.  She agreed.  Continue Wellbutrin XL 300 mg daily and amitriptyline 100 mg at bedtime.  Recommended to call us back if is any question or any concern.  Follow-up in 3 months.  Follow Up Instructions:    I discussed the assessment and treatment plan with the patient. The patient was provided an opportunity to ask questions and all were answered. The patient agreed  with the plan and demonstrated an understanding of the instructions.   The patient was advised to call back or seek an in-person evaluation if the symptoms worsen or if the condition fails to improve as anticipated.  I provided 15 minutes of non-face-to-face time during this encounter.   Kathlee Nations, MD

## 2021-08-03 ENCOUNTER — Ambulatory Visit (INDEPENDENT_AMBULATORY_CARE_PROVIDER_SITE_OTHER): Payer: Medicare Other | Admitting: Emergency Medicine

## 2021-08-03 ENCOUNTER — Encounter: Payer: Self-pay | Admitting: Emergency Medicine

## 2021-08-03 ENCOUNTER — Other Ambulatory Visit: Payer: Self-pay

## 2021-08-03 VITALS — Ht 62.0 in | Wt 126.0 lb

## 2021-08-03 DIAGNOSIS — Z20822 Contact with and (suspected) exposure to covid-19: Secondary | ICD-10-CM

## 2021-08-03 DIAGNOSIS — M549 Dorsalgia, unspecified: Secondary | ICD-10-CM | POA: Insufficient documentation

## 2021-08-03 LAB — POC COVID19 BINAXNOW: SARS Coronavirus 2 Ag: NEGATIVE

## 2021-08-03 MED ORDER — ACETAMINOPHEN-CODEINE 300-30 MG PO TABS: 1.0000 | ORAL_TABLET | Freq: Four times a day (QID) | ORAL | 0 refills | Status: DC | PRN

## 2021-08-03 NOTE — Assessment & Plan Note (Signed)
Stable.  No red flag signs or symptoms.  Has tried Tylenol with only mild relief. Will prescribe Tylenol with codeine for as needed use. Follow-up with PCP as needed

## 2021-08-03 NOTE — Patient Instructions (Signed)
Acute Back Pain, Adult ?Acute back pain is sudden and usually short-lived. It is often caused by an injury to the muscles and tissues in the back. The injury may result from: ?A muscle, tendon, or ligament getting overstretched or torn. Ligaments are tissues that connect bones to each other. Lifting something improperly can cause a back strain. ?Wear and tear (degeneration) of the spinal disks. Spinal disks are circular tissue that provide cushioning between the bones of the spine (vertebrae). ?Twisting motions, such as while playing sports or doing yard work. ?A hit to the back. ?Arthritis. ?You may have a physical exam, lab tests, and imaging tests to find the cause of your pain. Acute back pain usually goes away with rest and home care. ?Follow these instructions at home: ?Managing pain, stiffness, and swelling ?Take over-the-counter and prescription medicines only as told by your health care provider. Treatment may include medicines for pain and inflammation that are taken by mouth or applied to the skin, or muscle relaxants. ?Your health care provider may recommend applying ice during the first 24-48 hours after your pain starts. To do this: ?Put ice in a plastic bag. ?Place a towel between your skin and the bag. ?Leave the ice on for 20 minutes, 2-3 times a day. ?Remove the ice if your skin turns bright red. This is very important. If you cannot feel pain, heat, or cold, you have a greater risk of damage to the area. ?If directed, apply heat to the affected area as often as told by your health care provider. Use the heat source that your health care provider recommends, such as a moist heat pack or a heating pad. ?Place a towel between your skin and the heat source. ?Leave the heat on for 20-30 minutes. ?Remove the heat if your skin turns bright red. This is especially important if you are unable to feel pain, heat, or cold. You have a greater risk of getting burned. ?Activity ? ?Do not stay in bed. Staying in  bed for more than 1-2 days can delay your recovery. ?Sit up and stand up straight. Avoid leaning forward when you sit or hunching over when you stand. ?If you work at a desk, sit close to it so you do not need to lean over. Keep your chin tucked in. Keep your neck drawn back, and keep your elbows bent at a 90-degree angle (right angle). ?Sit high and close to the steering wheel when you drive. Add lower back (lumbar) support to your car seat, if needed. ?Take short walks on even surfaces as soon as you are able. Try to increase the length of time you walk each day. ?Do not sit, drive, or stand in one place for more than 30 minutes at a time. Sitting or standing for long periods of time can put stress on your back. ?Do not drive or use heavy machinery while taking prescription pain medicine. ?Use proper lifting techniques. When you bend and lift, use positions that put less stress on your back: ?Bend your knees. ?Keep the load close to your body. ?Avoid twisting. ?Exercise regularly as told by your health care provider. Exercising helps your back heal faster and helps prevent back injuries by keeping muscles strong and flexible. ?Work with a physical therapist to make a safe exercise program, as recommended by your health care provider. Do any exercises as told by your physical therapist. ?Lifestyle ?Maintain a healthy weight. Extra weight puts stress on your back and makes it difficult to have good   posture. ?Avoid activities or situations that make you feel anxious or stressed. Stress and anxiety increase muscle tension and can make back pain worse. Learn ways to manage anxiety and stress, such as through exercise. ?General instructions ?Sleep on a firm mattress in a comfortable position. Try lying on your side with your knees slightly bent. If you lie on your back, put a pillow under your knees. ?Keep your head and neck in a straight line with your spine (neutral position) when using electronic equipment like  smartphones or pads. To do this: ?Raise your smartphone or pad to look at it instead of bending your head or neck to look down. ?Put the smartphone or pad at the level of your face while looking at the screen. ?Follow your treatment plan as told by your health care provider. This may include: ?Cognitive or behavioral therapy. ?Acupuncture or massage therapy. ?Meditation or yoga. ?Contact a health care provider if: ?You have pain that is not relieved with rest or medicine. ?You have increasing pain going down into your legs or buttocks. ?Your pain does not improve after 2 weeks. ?You have pain at night. ?You lose weight without trying. ?You have a fever or chills. ?You develop nausea or vomiting. ?You develop abdominal pain. ?Get help right away if: ?You develop new bowel or bladder control problems. ?You have unusual weakness or numbness in your arms or legs. ?You feel faint. ?These symptoms may represent a serious problem that is an emergency. Do not wait to see if the symptoms will go away. Get medical help right away. Call your local emergency services (911 in the [place] Do not drive yourself to the hospital. ?Summary ?Acute back pain is sudden and usually short-lived. ?Use proper lifting techniques. When you bend and lift, use positions that put less stress on your back. ?Take over-the-counter and prescription medicines only as told by your health care provider, and apply heat or ice as told. ?This information is not intended to replace advice given to you by your health care provider. Make sure you discuss any questions you have with your health care provider. ?Document Revised: 10/24/2020 Document Reviewed: 10/24/2020 ?[employer] Patient Education ? 2022 [employer] ? ?

## 2021-08-03 NOTE — Progress Notes (Signed)
Sonia Skinner 70 y.o.   Chief Complaint  Patient presents with   Back Pain    Lower back, x2 days. Pt states she was exposed to COVID, no symptoms, would like a covid test    HISTORY OF PRESENT ILLNESS: Acute problem visit today. This is a 70 y.o. female complaining of low back pain for 2 days. She was also exposed to [place].  Asymptomatic.  Requesting COVID test.  Will be visiting sister with myasthenia gravis and needs to know if she is positive or negative. No other complaints or medical concerns today.  Back Pain This is a new problem. The current episode started in the past 7 days. The problem occurs constantly. The problem has been waxing and waning since onset. The pain is present in the lumbar spine. The quality of the pain is described as aching. The pain does not radiate. The pain is at a severity of 7/10. The pain is moderate. The pain is The same all the time. The symptoms are aggravated by position and twisting. Stiffness is present All day. Pertinent negatives include no abdominal pain, bladder incontinence, bowel incontinence, chest pain, dysuria, fever, headaches, leg pain, numbness, paresis, paresthesias, pelvic pain, perianal numbness, tingling, weakness or weight loss. She has tried analgesics for the symptoms. The treatment provided no relief.    Prior to Admission medications   Medication Sig Start Date End Date Taking? Authorizing Provider  Accu-Chek FastClix Lancets MISC Use to check blood sugar five times daily. 04/21/21  Yes [name], MD  amitriptyline (ELAVIL) 100 MG tablet Take 1 tablet (100 mg total) by mouth at bedtime. 07/23/21  Yes Arfeen, Arlyce Harman, MD  aspirin 325 MG tablet Take 1 tablet (325 mg total) by mouth daily. 11/06/13  Yes Samella Parr, NP  atorvastatin (LIPITOR) 40 MG tablet Take 1 tablet (40 mg total) by mouth daily. 04/08/21  Yes [name], MD  blood glucose meter kit and supplies KIT Use to test blood sugars 04/27/19  Yes [name],  MD  buPROPion (WELLBUTRIN XL) 300 MG 24 hr tablet Take 1 tablet (300 mg total) by mouth daily. 07/23/21  Yes Arfeen, Arlyce Harman, MD  Continuous Blood Gluc Receiver (FREESTYLE LIBRE 2 READER) DEVI 1 Act by Does not apply route daily. 11/27/20  Yes [name], MD  Continuous Blood Gluc Sensor (FREESTYLE LIBRE 2 SENSOR) MISC 1 Act by Does not apply route daily. 11/27/20  Yes [name], MD  Cyanocobalamin (VITAMIN B-12 CR) 1500 MCG TBCR Take 1 tablet (1,500 mcg total) by mouth daily. 09/26/18  Yes [name], MD  dapagliflozin propanediol (FARXIGA) 10 MG TABS tablet Take 1 tablet (10 mg total) by mouth daily before breakfast. 04/08/21  Yes [name], MD  dexlansoprazole (DEXILANT) 60 MG capsule Take 1 capsule by mouth daily. 05/26/21  Yes [name], MD  EDARBYCLOR 40-12.5 MG TABS Take 1 tablet by mouth daily. 06/25/21  Yes [name], MD  gabapentin (NEURONTIN) 100 MG capsule Take 1 capsule by mouth twice daily.  11/28/19  Yes [name], MD  glucose blood (ACCU-CHEK GUIDE) test strip Use to check blood sugars five times daily. 04/21/21  Yes [name], MD  hydrALAZINE (APRESOLINE) 25 MG tablet Take 1 tablet by mouth in the morning and at bedtime. 09/27/20  Yes [name], MD  insulin lispro (HUMALOG KWIKPEN) 200 UNIT/ML KwikPen Inject 10 Units into the skin 3 (three) times daily with meals  as needed. 04/02/20  Yes [name], MD  Insulin Pen Needle 32G X 4 MM MISC Use to administer insulin four times a day 04/02/20  Yes [name], MD  levothyroxine (SYNTHROID) 112 MCG tablet Take 1 tablet by mouth daily before breakfast. 06/25/21  Yes [name], MD  loratadine (CLARITIN) 10 MG tablet Take 1 tablet (10 mg total) by mouth daily. 04/21/21  Yes [name], MD  Semaglutide, 2 MG/DOSE, 8 MG/3ML SOPN Inject 2 mg as directed once a week. 04/08/21  Yes [name], MD  TOUJEO SOLOSTAR 300 UNIT/ML Solostar Pen Inject 50 units under the skin every morning.  06/21/21  Yes [name], MD    Allergies  Allergen Reactions   Anesthetics, [place] Anaphylaxis    Patient Active Problem List   Diagnosis Date Noted   Encounter for general adult medical examination with abnormal findings 04/08/2021   Deficiency anemia 04/24/2020   Stage 3b chronic kidney disease ([place]) 04/02/2020   Visit for screening mammogram 04/01/2020   Type 2 diabetes mellitus with complication, with long-term current use of insulin ([place]) 09/18/2018   Age-related osteoporosis with current pathological fracture 02/20/2018   Intractable migraine without aura and with status migrainosus 11/24/2016   Left thyroid nodule 05/05/2016   GERD (gastroesophageal reflux disease) 03/08/2016   Major depressive disorder, recurrent episode ([place]) 02/20/2016   Occlusion and stenosis of carotid artery with cerebral infarction 11/25/2013   Cerebrovascular accident (CVA) due to stenosis of left middle cerebral artery ([place]) 11/02/2013   Vitamin D deficiency 10/19/2013   Tobacco abuse 07/31/2013   Essential hypertension, benign 04/20/2013   Type II diabetes mellitus with manifestations ([place]) 04/20/2013   Hypothyroidism 04/20/2013   Hyperlipidemia with target LDL less than 70 04/20/2013    Past Medical History:  Diagnosis Date   Ankle fracture    Left   Anxiety    Carotid artery occlusion    Closed fracture of left distal fibula 12/22/5275   Complication of anesthesia    ALLERY TO ESTER BASE   Depression    early 7s   Diabetes mellitus    INSULIN DEPENDENT   GERD (gastroesophageal reflux disease)    Headache    years ago   Hypertension    Pneumonia    Stroke [facility]) March 2015    left MCA infarct, slight weakness on left side   Thyroid disease     Past Surgical History:  Procedure Laterality Date   ABDOMINAL HYSTERECTOMY     ANTERIOR FIXATION AND POSTERIOR MICRODISCECTOMY CERVICAL SPINE  1999   CAROTID ENDARTERECTOMY Left 11-04-13   cea   ENDARTERECTOMY Left 11/04/2013   ORIF  ANKLE FRACTURE Left 09/16/2017   Procedure: OPEN REDUCTION INTERNAL FIXATION (ORIF) LEFT ANKLE FRACTURE;  Surgeon: Marchia Bond, MD;  Location: [facility];  Service: Orthopedics;  Laterality: Left;    Social History   Socioeconomic History   Marital status: Legally Separated    Spouse name: Not on file   Number of children: 0   Years of education: College   Highest education level: Not on file  Occupational History   Occupation: [employer]    Employer: [employer]  Tobacco Use   Smoking status: Former    Packs/day: 0.20    Years: 20.00    Pack years: 4.00    Types: Cigarettes    Quit date: 11/01/2013    Years since quitting: 7.7   Smokeless tobacco: Never   Tobacco comments:    Reports  done to 1-2 a day.   Vaping Use   Vaping Use: Never used  Substance and Sexual Activity   Alcohol use: Yes    Alcohol/week: 1.0 - 2.0 standard drink    Types: 1 - 2 Standard drinks or equivalent per week   Drug use: No   Sexual activity: Not Currently  Other Topics Concern   Not on file  Social History Narrative   Patient lives in 1 story home with sister.   Caffeine Use: 2 cups daily; sodas occasionally   Some college education   Works as Oceanographer for [employer]   Social Determinants of Radio broadcast assistant Strain: Not on file  Food Insecurity: No Food Insecurity   Worried About Charity fundraiser in the Last Year: Never true   Arboriculturist in the Last Year: Never true  Transportation Needs: Unknown   Lack of Transportation (Medical): No   Lack of Transportation (Non-Medical): Not on file  Physical Activity: Not on file  Stress: Stress Concern Present   Feeling of Stress : Very much  Social Connections: Not on file  Intimate Partner Violence: Not on file    Family History  Problem Relation Age of Onset   Diabetes Mother    Heart disease Mother        Before age 60   Cancer Father        Lung   Hypertension Sister    Diabetes Sister    Diabetes Sister    Diabetes  Sister      Review of Systems  Constitutional:  Negative for fever and weight loss.  HENT: Negative.  Negative for congestion, nosebleeds and sore throat.   Respiratory: Negative.  Negative for cough and shortness of breath.   Cardiovascular:  Negative for chest pain and palpitations.  Gastrointestinal: Negative.  Negative for abdominal pain, blood in stool, bowel incontinence, nausea and vomiting.  Genitourinary:  Negative for bladder incontinence, dysuria and pelvic pain.  Musculoskeletal:  Positive for back pain.  Skin: Negative.  Negative for rash.  Neurological: Negative.  Negative for dizziness, tingling, weakness, numbness, headaches and paresthesias.  All other systems reviewed and are negative.  Today's Vitals   08/03/21 1518  Weight: 126 lb (57.2 kg)  Height: _0  (1.575 m)   Body mass index is 23.05 kg/m.  Physical Exam Vitals reviewed.  Constitutional:      Appearance: Normal appearance.  HENT:     Head: Normocephalic.  Eyes:     Extraocular Movements: Extraocular movements intact.     Pupils: Pupils are equal, round, and reactive to light.  Cardiovascular:     Rate and Rhythm: Normal rate and regular rhythm.     Pulses: Normal pulses.     Heart sounds: Normal heart sounds.  Pulmonary:     Effort: Pulmonary effort is normal.     Breath sounds: Normal breath sounds.  Abdominal:     General: There is no distension.     Palpations: Abdomen is soft.     Tenderness: There is no abdominal tenderness.  Musculoskeletal:     Cervical back: Normal range of motion and neck supple.  Skin:    General: Skin is warm and dry.     Capillary Refill: Capillary refill takes less than 2 seconds.  Neurological:     General: No focal deficit present.     Mental Status: She is alert and oriented to person, place, and time.  Psychiatric:  Mood and Affect: Mood normal.        Behavior: Behavior normal.    Results for orders placed or performed in visit on 08/03/21  (from the past 24 hour(s))  POC COVID-19     Status: None   Collection Time: 08/03/21  3:53 PM  Result Value Ref Range   SARS Coronavirus 2 Ag Negative Negative    ASSESSMENT & PLAN: Problem List Items Addressed This Visit       Other   Musculoskeletal back pain - Primary    Stable.  No red flag signs or symptoms.  Has tried Tylenol with only mild relief. Will prescribe Tylenol with codeine for as needed use. Follow-up with PCP as needed      Relevant Medications   Acetaminophen-Codeine (TYLENOL/CODEINE #3) 300-30 MG tablet   Other Visit Diagnoses     Close exposure to COVID-19 virus       Relevant Orders   POC COVID-19 (Completed)      Patient Instructions  Acute Back Pain, Adult Acute back pain is sudden and usually short-lived. It is often caused by an injury to the muscles and tissues in the back. The injury may result from: A muscle, tendon, or ligament getting overstretched or torn. Ligaments are tissues that connect bones to each other. Lifting something improperly can cause a back strain. Wear and tear (degeneration) of the spinal disks. Spinal disks are circular tissue that provide cushioning between the bones of the spine (vertebrae). Twisting motions, such as while playing sports or doing yard work. A hit to the back. Arthritis. You may have a physical exam, lab tests, and imaging tests to find the cause of your pain. Acute back pain usually goes away with rest and home care. Follow these instructions at home: Managing pain, stiffness, and swelling Take over-the-counter and prescription medicines only as told by your health care provider. Treatment may include medicines for pain and inflammation that are taken by mouth or applied to the skin, or muscle relaxants. Your health care provider may recommend applying ice during the first 24-48 hours after your pain starts. To do this: Put ice in a plastic bag. Place a towel between your skin and the bag. Leave the ice  on for 20 minutes, 2-3 times a day. Remove the ice if your skin turns bright red. This is very important. If you cannot feel pain, heat, or cold, you have a greater risk of damage to the area. If directed, apply heat to the affected area as often as told by your health care provider. Use the heat source that your health care provider recommends, such as a moist heat pack or a heating pad. Place a towel between your skin and the heat source. Leave the heat on for 20-30 minutes. Remove the heat if your skin turns bright red. This is especially important if you are unable to feel pain, heat, or cold. You have a greater risk of getting burned. Activity  Do not stay in bed. Staying in bed for more than 1-2 days can delay your recovery. Sit up and stand up straight. Avoid leaning forward when you sit or hunching over when you stand. If you work at a desk, sit close to it so you do not need to lean over. Keep your chin tucked in. Keep your neck drawn back, and keep your elbows bent at a 90-degree angle (right angle). Sit high and close to the steering wheel when you drive. Add lower back (lumbar) support to  your car seat, if needed. Take short walks on even surfaces as soon as you are able. Try to increase the length of time you walk each day. Do not sit, drive, or stand in one place for more than 30 minutes at a time. Sitting or standing for long periods of time can put stress on your back. Do not drive or use heavy machinery while taking prescription pain medicine. Use proper lifting techniques. When you bend and lift, use positions that put less stress on your back: [place] your knees. Keep the load close to your body. Avoid twisting. Exercise regularly as told by your health care provider. Exercising helps your back heal faster and helps prevent back injuries by keeping muscles strong and flexible. Work with a physical therapist to make a safe exercise program, as recommended by your health care  provider. Do any exercises as told by your physical therapist. Lifestyle Maintain a healthy weight. Extra weight puts stress on your back and makes it difficult to have good posture. Avoid activities or situations that make you feel anxious or stressed. Stress and anxiety increase muscle tension and can make back pain worse. Learn ways to manage anxiety and stress, such as through exercise. General instructions Sleep on a firm mattress in a comfortable position. Try lying on your side with your knees slightly bent. If you lie on your back, put a pillow under your knees. Keep your head and neck in a straight line with your spine (neutral position) when using electronic equipment like smartphones or pads. To do this: Raise your smartphone or pad to look at it instead of bending your head or neck to look down. Put the smartphone or pad at the level of your face while looking at the screen. Follow your treatment plan as told by your health care provider. This may include: Cognitive or behavioral therapy. Acupuncture or massage therapy. Meditation or yoga. Contact a health care provider if: You have pain that is not relieved with rest or medicine. You have increasing pain going down into your legs or buttocks. Your pain does not improve after 2 weeks. You have pain at night. You lose weight without trying. You have a fever or chills. You develop nausea or vomiting. You develop abdominal pain. Get help right away if: You develop new bowel or bladder control problems. You have unusual weakness or numbness in your arms or legs. You feel faint. These symptoms may represent a serious problem that is an emergency. Do not wait to see if the symptoms will go away. Get medical help right away. Call your local emergency services (911 in the [place] Do not drive yourself to the hospital. Summary Acute back pain is sudden and usually short-lived. Use proper lifting techniques. When you bend and lift, use  positions that put less stress on your back. Take over-the-counter and prescription medicines only as told by your health care provider, and apply heat or ice as told. This information is not intended to replace advice given to you by your health care provider. Make sure you discuss any questions you have with your health care provider. Document Revised: 10/24/2020 Document Reviewed: 10/24/2020 Elsevier Patient Education  2022 [place], MD [facility]

## 2021-08-04 ENCOUNTER — Other Ambulatory Visit: Payer: Self-pay | Admitting: *Deleted

## 2021-08-04 NOTE — Patient Outreach (Signed)
[place] [facility]) Care Management  08/04/2021  Sonia Skinner 07-16-1951 [id number]  [place] [facility]) Care Management [place] Note   08/04/2021 Name:  Sonia Skinner MRN:  [id number] DOB:  03/29/1951  Summary: Patient states that she had a provider appointment yesterday due to experiencing lower back pain and to rule out covid. Her covid test was negative and she was given medication to treat her pain which is helping. Patient continues to be stressed due to working full time and caring for her husband of which she is legally separated. Patient states she has not been taking good care of herself and states that she is going to get back on track with taking her blood sugar and B/P daily. Nurse praised patient for decreasing her A1c from 10.2 to 7.2. Nurse will send patient hypertension and relaxation education.   Recommendations/Changes made from today's visit:  Take your blood sugar and B/P daily, record the values and contact PCP for parameter concerns Call your insurance [facility] to ask about a dentist and psychiatrist in network Decrease your stress level when possible by limiting your care involvement with your husband as much as possible Arrange to have your husband's medications prepackaged and delivered Take time to relax and do something just for you each day; care for yourself    Subjective: Sonia Skinner is an 70 y.o. year old female who is a primary patient of [name], MD. The care management team was consulted for assistance with care management and/or care coordination needs.    RN Health Coach completed Telephone Visit today.   Objective:  Medications Reviewed Today     Reviewed by Michiel Cowboy, RN (Registered Nurse) on 08/04/21 at [phone]  Med List Status: <None>   Medication Order Taking? Sig Documenting Provider Last Dose Status Informant  Accu-Chek FastClix Lancets MISC [id number]  Use to check blood sugar five times daily. [name], MD  Active   Acetaminophen-Codeine (TYLENOL/CODEINE #3) 300-30 MG tablet [id number]  Take 1 tablet by mouth every 6 (six) hours as needed for pain. Horald Pollen, MD  Active   amitriptyline (ELAVIL) 100 MG tablet [id number]  Take 1 tablet (100 mg total) by mouth at bedtime. Kathlee Nations, MD  Active   aspirin 325 MG tablet [id number]  Take 1 tablet (325 mg total) by mouth daily. Samella Parr, NP  Active Self  atorvastatin (LIPITOR) 40 MG tablet [id number]  Take 1 tablet (40 mg total) by mouth daily. [name], MD  Active   blood glucose meter kit and supplies KIT [id number]  Use to test blood sugars [name], MD  Active   buPROPion (WELLBUTRIN XL) 300 MG 24 hr tablet [id number]  Take 1 tablet (300 mg total) by mouth daily. Kathlee Nations, MD  Active   Continuous Blood Gluc Receiver (FREESTYLE LIBRE 2 READER) DEVI [id number] No 1 Act by Does not apply route daily.  Patient not taking: Reported on 08/04/2021   [name], MD Not Taking Active            Med Note Laretta Alstrom, Kamri Gotsch A   Mon Mar 09, 2021  3:08 PM) No taking  Continuous Blood Gluc Sensor (FREESTYLE LIBRE 2 SENSOR) [place] [id number] No 1 Act by Does not apply route daily.  Patient not taking: Reported on 08/04/2021   [name], MD Not Taking Active            Med  Note Hoyt Koch, Gaurav Baldree A   Mon Mar 09, 2021  3:08 PM) No taking  Cyanocobalamin (VITAMIN B-12 CR) 1500 MCG TBCR [id number] No Take 1 tablet (1,500 mcg total) by mouth daily.  Patient not taking: Reported on 08/04/2021   Etta Grandchild, MD Not Taking Active            Med Note Hoyt Koch, Torrey Horseman A   Mon Mar 09, 2021  3:04 PM) Not taking  dapagliflozin propanediol (FARXIGA) 10 MG TABS tablet [id number] Yes Take 1 tablet (10 mg total) by mouth daily before breakfast. Etta Grandchild, MD Taking Active   dexlansoprazole (DEXILANT) 60 MG capsule [id number] Yes Take 1 capsule by mouth daily. Etta Grandchild, MD Taking Active   EDARBYCLOR 40-12.5 MG TABS [id number] Yes Take  1 tablet by mouth daily. Etta Grandchild, MD Taking Active   gabapentin (NEURONTIN) 100 MG capsule [id number] Yes Take 1 capsule by mouth twice daily.  Etta Grandchild, MD Taking Active   glucose blood (ACCU-CHEK GUIDE) test strip [id number] Yes Use to check blood sugars five times daily. Etta Grandchild, MD Taking Active   hydrALAZINE (APRESOLINE) 25 MG tablet [id number] Yes Take 1 tablet by mouth in the morning and at bedtime. Etta Grandchild, MD Taking Active   insulin lispro (HUMALOG KWIKPEN) 200 UNIT/ML KwikPen [id number] No Inject 10 Units into the skin 3 (three) times daily with meals as needed.  Patient not taking: Reported on 08/04/2021   Etta Grandchild, MD Not Taking Active            Med Note Maple Mirza   Fri Aug 01, 2020  4:24 PM) Reports not taking  Insulin Pen Needle 32G X 4 MM MISC [id number] Yes Use to administer insulin four times a day Etta Grandchild, MD Taking Active   levothyroxine (SYNTHROID) 112 MCG tablet [id number] Yes Take 1 tablet by mouth daily before breakfast. Etta Grandchild, MD Taking Active   loratadine (CLARITIN) 10 MG tablet [id number] Yes Take 1 tablet (10 mg total) by mouth daily. Etta Grandchild, MD Taking Active   Semaglutide, 2 MG/DOSE, 8 MG/3ML Namon Cirri [id number] Yes Inject 2 mg as directed once a week. Etta Grandchild, MD Taking Active   TOUJEO SOLOSTAR 300 UNIT/ML Solostar Pen [id number] Yes Inject 50 units under the skin every morning. Etta Grandchild, MD Taking Active              SDOH:  (Social Determinants of Health) assessments and interventions performed:  SDOH Interventions    Flowsheet Row Most Recent Value  SDOH Interventions   Depression Interventions/Treatment  Medication  [Sees Dr. Arfeen]       Care Plan  Review of patient past medical history, allergies, medications, health status, including review of consultants reports, laboratory and other test data, was performed as part of comprehensive evaluation for care management  services.   Care Plan : Wellness (Adult)  Updates made by Wanda Plump, RN since 08/04/2021 12:00 AM     Problem: Health Literacy (Wellness) Resolved 08/04/2021  Priority: Medium     Care Plan : Diabetes Type 2 (Adult)  Updates made by Wanda Plump, RN since 08/04/2021 12:00 AM     Problem: Glycemic Management (Diabetes, Type 2) Resolved 08/04/2021  Priority: Medium     Long-Range Goal: Glycemic Management Optimized Completed 08/04/2021  Start Date: 08/04/2021  Note:   Resolving due to duplicate goal  Resolving due to duplicate goal  Evidence-based guidance:  Anticipate A1C testing (point-of-care) every 3 to 6 months based on goal attainment.  Review mutually-set A1C goal or target range.  Anticipate use of antihyperglycemic with or without insulin and periodic adjustments; consider active involvement of pharmacist.  Provide medical nutrition therapy and development of individualized eating.  Compare self-reported symptoms of hypo or hyperglycemia to blood glucose levels, diet and fluid intake, current medications, psychosocial and physiologic stressors, change in activity and barriers to care adherence.  Promote self-monitoring of blood glucose levels.  Assess and address barriers to management plan, such as food insecurity, age, developmental ability, depression, anxiety, fear of hypoglycemia or weight gain, as well as medication cost, side effects and complicated regimen.  Consider referral to community-based diabetes education program, visiting nurse, community health worker or health coach.  Encourage regular dental care for treatment of periodontal disease; refer to dental provider when needed.   Notes:     Task: Alleviate Barriers to Glycemic Management Completed 08/04/2021  Due Date: 02/12/2021  Note:   Care Management Activities:    - blood glucose monitoring encouraged - blood glucose readings reviewed - mutual A1C goal set or reviewed - self-awareness of  signs/symptoms of hypo or hyperglycemia encouraged - use of blood glucose monitoring log promoted    Notes:     Care Plan : Hypertension (Adult)  Updates made by Michiel Cowboy, RN since 08/04/2021 12:00 AM     Problem: Hypertension (Hypertension) Resolved 08/04/2021  Priority: Medium     Long-Range Goal: Hypertension Monitored Completed 08/04/2021  Start Date: 04/01/2021  Expected End Date: 04/14/2022  Note:   Evidence-based guidance:  Promote initial use of ambulatory blood pressure measurements (for 3 days) to rule out "white-coat" effect; identify masked hypertension and presence or absence of nocturnal "dipping" of blood pressure.   Encourage continued use of home blood pressure monitoring and recording in blood pressure log; include symptoms of hypotension or potential medication side effects in log.  Review blood pressure measurements taken inside and outside of the provider office; establish baseline and monitor trends; compare to target ranges or patient goal.  Share overall cardiovascular risk with patient; encourage changes to lifestyle risk factors, including alcohol consumption, smoking, inadequate exercise, poor dietary habits and stress.   Notes:     Task: Identify and Monitor Blood Pressure Elevation Completed 08/04/2021  Due Date: 04/14/2022  Note:   Care Management Activities:    - blood pressure trends reviewed - home or ambulatory blood pressure monitoring encouraged    Notes:     Care Plan : [facility] of Care  Updates made by Michiel Cowboy, RN since 08/04/2021 12:00 AM     Problem: Knowledge Deficit Related to Diabetes and Hypertension   Priority: High     Long-Range Goal: Development of Plan of Care for Diabetes and Hypertension   Start Date: 08/04/2021  Expected End Date: 08/14/2022  Priority: High  Note:   Current Barriers:  Knowledge Deficits related to plan of care for management of HTN and DMII   RNCM Clinical Goal(s):  Patient  will take all medications exactly as prescribed and will call provider for medication related questions as evidenced by Patient's verbalization that she takes her prescribed medications as written and contacts her providers as needed demonstrate Improved adherence to prescribed treatment plan for HTN and DMII as evidenced by monitoring her blood sugar and B/P daily and recording the values; adheres to a low sodium and diabetic diet; maintains her A1c below 7.5 and B/P <  150/90 continue to work with RN Care Manager to address care management and care coordination needs related to  HTN and DMII as evidenced by adherence to CM Team Scheduled appointments through collaboration with RN Care manager, provider, and care team.   Interventions: Inter-disciplinary care team collaboration (see longitudinal plan of care) Evaluation of current treatment plan related to  self management and patient's adherence to plan as established by provider   Diabetes Interventions:  (Status:  Goal on track:  Yes.) Long Term Goal Assessed patient's understanding of A1c goal: <7% Provided education to patient about basic DM disease process Reviewed medications with patient and discussed importance of medication adherence Advised patient, providing education and rationale, to check cbg daily and record, calling PCP for findings outside established parameters Assessed social determinant of health barriers Advised patient to limit sugar and carbohydrates in her diet Praised patient for decreasing her A1c from 10.2 to 7.2 Lab Results  Component Value Date   HGBA1C 7.2 (H) 04/07/2021   Hypertension Interventions:  (Status:  Goal on track:  NO.) Long Term Goal Last practice recorded BP readings:  BP Readings from Last 3 Encounters:  04/07/21 (!) 162/68  04/24/20 (!) 176/86  04/09/20 (!) 183/77  Most recent eGFR/CrCl: No results found for: EGFR  No components found for: CRCL  Reviewed medications with patient and  discussed importance of compliance Provided assistance with obtaining home blood pressure monitor via previously mailed patient a B/P monitor; Discussed plans with patient for ongoing care management follow up and provided patient with direct contact information for care management team Advised patient, providing education and rationale, to monitor blood pressure daily and record, calling PCP for findings outside established parameters Provided education on prescribed diet low sodium Discussed complications of poorly controlled blood pressure such as heart disease, stroke, circulatory complications, vision complications, kidney impairment, sexual dysfunction Encouraged patient to decrease her stress level by decreasing her responsibilities and relaxing when possible Nurse will send patient hypertension and relaxation education  Patient Goals/Self-Care Activities: Take all medications as prescribed Attend all scheduled provider appointments schedule appointment with eye doctor check blood sugar at prescribed times: once daily check feet daily for cuts, sores or redness enter blood sugar readings and medication or insulin into daily log take the blood sugar log to all doctor visits trim toenails straight across drink 6 to 8 glasses of water each day wear comfortable, well-fitting shoes check blood pressure daily write blood pressure results in a log or diary learn about high blood pressure take blood pressure log to all doctor appointments call doctor for signs and symptoms of high blood pressure take medications for blood pressure exactly as prescribed report new symptoms to your doctor Limit sodium, sugar, and carbohydrates in your diet Call your insurance [facility] to ask about a dentist and psychiatrist in network Decrease your stress level when possible by limiting your care involvement with your husband as much as possible Arrange to have your husband's medications prepackaged and  delivered Take time to relax and do something just for you each day; care for yourself   Follow Up Plan:  Telephone follow up appointment with care management team member scheduled for:  February       Plan: Telephone follow up appointment with care management team member scheduled for:  February. Nurse will send PCP today's assessment note.   [name] RN, [place] [phone] [email or web address]

## 2021-08-04 NOTE — Patient Instructions (Addendum)
Visit Information  Thank you for taking time to visit with me today. Please don't hesitate to contact me if I can be of assistance to you before our next scheduled telephone appointment.  Following are the goals we discussed today:  Patient Goals/Self-Care Activities: Take all medications as prescribed Attend all scheduled provider appointments schedule appointment with eye doctor check blood sugar at prescribed times: once daily check feet daily for cuts, sores or redness enter blood sugar readings and medication or insulin into daily log take the blood sugar log to all doctor visits trim toenails straight across drink 6 to 8 glasses of water each day wear comfortable, well-fitting shoes check blood pressure daily write blood pressure results in a log or diary learn about high blood pressure take blood pressure log to all doctor appointments call doctor for signs and symptoms of high blood pressure take medications for blood pressure exactly as prescribed report new symptoms to your doctor Limit sodium, sugar, and carbohydrates in your diet Call your insurance [facility] to ask about a dentist and psychiatrist in network Decrease your stress level when possible by limiting your care involvement with your husband as much as possible Arrange to have your husband's medications prepackaged and delivered Take time to relax and do something just for you each day; care for yourself   Follow Up Plan:  Telephone follow up appointment with care management team member scheduled for:  February   The patient verbalized understanding of instructions, educational materials, and care plan provided today and agreed to receive a mailed copy of patient instructions, educational materials, and care plan.   Telephone follow up appointment with care management team member scheduled [id number]  [name] RN, [place] [phone] [email or web address]    t

## 2021-08-05 ENCOUNTER — Telehealth (HOSPITAL_COMMUNITY): Payer: Medicare Other | Admitting: Psychiatry

## 2021-08-13 ENCOUNTER — Emergency Department (HOSPITAL_COMMUNITY): Payer: Medicare Other

## 2021-08-13 ENCOUNTER — Observation Stay (HOSPITAL_COMMUNITY)
Admission: EM | Admit: 2021-08-13 | Discharge: 2021-08-15 | Disposition: A | Discharge status: Home / Self Care | Payer: Medicare Other | Attending: Internal Medicine | Admitting: Internal Medicine

## 2021-08-13 ENCOUNTER — Other Ambulatory Visit: Payer: Self-pay

## 2021-08-13 DIAGNOSIS — Y900 Blood alcohol level of less than 20 mg/100 ml: Secondary | ICD-10-CM | POA: Diagnosis not present

## 2021-08-13 DIAGNOSIS — E118 Type 2 diabetes mellitus with unspecified complications: Secondary | ICD-10-CM | POA: Diagnosis present

## 2021-08-13 DIAGNOSIS — Z794 Long term (current) use of insulin: Secondary | ICD-10-CM | POA: Diagnosis not present

## 2021-08-13 DIAGNOSIS — I129 Hypertensive chronic kidney disease with stage 1 through stage 4 chronic kidney disease, or unspecified chronic kidney disease: Secondary | ICD-10-CM | POA: Diagnosis not present

## 2021-08-13 DIAGNOSIS — Z87891 Personal history of nicotine dependence: Secondary | ICD-10-CM | POA: Diagnosis not present

## 2021-08-13 DIAGNOSIS — E039 Hypothyroidism, unspecified: Secondary | ICD-10-CM | POA: Diagnosis present

## 2021-08-13 DIAGNOSIS — Z7982 Long term (current) use of aspirin: Secondary | ICD-10-CM | POA: Diagnosis not present

## 2021-08-13 DIAGNOSIS — R41841 Cognitive communication deficit: Secondary | ICD-10-CM | POA: Diagnosis not present

## 2021-08-13 DIAGNOSIS — E119 Type 2 diabetes mellitus without complications: Secondary | ICD-10-CM | POA: Diagnosis present

## 2021-08-13 DIAGNOSIS — R29898 Other symptoms and signs involving the musculoskeletal system: Secondary | ICD-10-CM | POA: Diagnosis not present

## 2021-08-13 DIAGNOSIS — G459 Transient cerebral ischemic attack, unspecified: Principal | ICD-10-CM | POA: Diagnosis present

## 2021-08-13 DIAGNOSIS — E1122 Type 2 diabetes mellitus with diabetic chronic kidney disease: Secondary | ICD-10-CM | POA: Insufficient documentation

## 2021-08-13 DIAGNOSIS — Z79899 Other long term (current) drug therapy: Secondary | ICD-10-CM | POA: Diagnosis not present

## 2021-08-13 DIAGNOSIS — R200 Anesthesia of skin: Secondary | ICD-10-CM | POA: Diagnosis not present

## 2021-08-13 DIAGNOSIS — R29818 Other symptoms and signs involving the nervous system: Secondary | ICD-10-CM | POA: Diagnosis not present

## 2021-08-13 DIAGNOSIS — Z20822 Contact with and (suspected) exposure to covid-19: Secondary | ICD-10-CM | POA: Diagnosis not present

## 2021-08-13 DIAGNOSIS — I10 Essential (primary) hypertension: Secondary | ICD-10-CM | POA: Diagnosis present

## 2021-08-13 DIAGNOSIS — N1832 Chronic kidney disease, stage 3b: Secondary | ICD-10-CM | POA: Diagnosis not present

## 2021-08-13 DIAGNOSIS — E785 Hyperlipidemia, unspecified: Secondary | ICD-10-CM | POA: Diagnosis present

## 2021-08-13 LAB — DIFFERENTIAL
Abs Immature Granulocytes: 0.02 10*3/uL (ref 0.00–0.07)
Basophils Absolute: 0.1 10*3/uL (ref 0.0–0.1)
Basophils Relative: 1 %
Eosinophils Absolute: 0.3 10*3/uL (ref 0.0–0.5)
Eosinophils Relative: 2 %
Immature Granulocytes: 0 %
Lymphocytes Relative: 29 %
Lymphs Abs: 3.5 10*3/uL (ref 0.7–4.0)
Monocytes Absolute: 0.8 10*3/uL (ref 0.1–1.0)
Monocytes Relative: 7 %
Neutro Abs: 7.5 10*3/uL (ref 1.7–7.7)
Neutrophils Relative %: 61 %

## 2021-08-13 LAB — CBC
HCT: 35.4 % — ABNORMAL LOW (ref 36.0–46.0)
Hemoglobin: 11.1 g/dL — ABNORMAL LOW (ref 12.0–15.0)
MCH: 27.7 pg (ref 26.0–34.0)
MCHC: 31.4 g/dL (ref 30.0–36.0)
MCV: 88.3 fL (ref 80.0–100.0)
Platelets: 242 10*3/uL (ref 150–400)
RBC: 4.01 MIL/uL (ref 3.87–5.11)
RDW: 13.8 % (ref 11.5–15.5)
WBC: 12.2 10*3/uL — ABNORMAL HIGH (ref 4.0–10.5)
nRBC: 0 % (ref 0.0–0.2)

## 2021-08-13 LAB — PROTIME-INR
INR: 1.1 (ref 0.8–1.2)
Prothrombin Time: 13.8 seconds (ref 11.4–15.2)

## 2021-08-13 LAB — COMPREHENSIVE METABOLIC PANEL
ALT: 14 U/L (ref 0–44)
AST: 13 U/L — ABNORMAL LOW (ref 15–41)
Albumin: 3.7 g/dL (ref 3.5–5.0)
Alkaline Phosphatase: 96 U/L (ref 38–126)
Anion gap: 10 (ref 5–15)
BUN: 18 mg/dL (ref 8–23)
CO2: 22 mmol/L (ref 22–32)
Calcium: 9.4 mg/dL (ref 8.9–10.3)
Chloride: 106 mmol/L (ref 98–111)
Creatinine, Ser: 1.83 mg/dL — ABNORMAL HIGH (ref 0.44–1.00)
GFR, Estimated: 29 mL/min — ABNORMAL LOW (ref >60)
Glucose, Bld: 214 mg/dL — ABNORMAL HIGH (ref 70–99)
Potassium: 3.8 mmol/L (ref 3.5–5.1)
Sodium: 138 mmol/L (ref 135–145)
Total Bilirubin: 0.5 mg/dL (ref 0.3–1.2)
Total Protein: 6.7 g/dL (ref 6.5–8.1)

## 2021-08-13 LAB — RESP PANEL BY RT-PCR (FLU A&B, COVID) ARPGX2
Influenza A by PCR: NEGATIVE
Influenza B by PCR: NEGATIVE
SARS Coronavirus 2 by RT PCR: NEGATIVE

## 2021-08-13 LAB — I-STAT CHEM 8, ED
BUN: 22 mg/dL (ref 8–23)
Calcium, Ion: 1.24 mmol/L (ref 1.15–1.40)
Chloride: 106 mmol/L (ref 98–111)
Creatinine, Ser: 1.8 mg/dL — ABNORMAL HIGH (ref 0.44–1.00)
Glucose, Bld: 210 mg/dL — ABNORMAL HIGH (ref 70–99)
HCT: 36 % (ref 36.0–46.0)
Hemoglobin: 12.2 g/dL (ref 12.0–15.0)
Potassium: 3.9 mmol/L (ref 3.5–5.1)
Sodium: 141 mmol/L (ref 135–145)
TCO2: 23 mmol/L (ref 22–32)

## 2021-08-13 LAB — ETHANOL: Alcohol, Ethyl (B): <10 mg/dL (ref <10)

## 2021-08-13 LAB — CBG MONITORING, ED: Glucose-Capillary: 214 mg/dL — ABNORMAL HIGH (ref 70–99)

## 2021-08-13 LAB — APTT: aPTT: 29 seconds (ref 24–36)

## 2021-08-13 NOTE — ED Provider Notes (Signed)
Emergency Medicine Provider Triage Evaluation Note  Sonia Skinner , a 70 y.o. female  was evaluated in triage.  Pt complains of decreased sensation to left side ongoing today. Last known normal at 5-6 pm today. Pt has a history of prior CVA in 2015. Pt denies residual weakness. Hasn't tried any medications. Denies anticoagulant use.  Denies chest pain, shortness of breath, abdominal pain, nausea, vomiting, fever, chills.  Review of Systems  Positive: Decreased sensation to face/left arm/leg Negative: Chest pain  Physical Exam  BP (!) 149/43 (BP Location: Right Arm)    Pulse (!) 101    Temp 98.7 F (37.1 C) (Oral)    Resp 16    SpO2 100%  Gen:   Awake, no distress   Resp:  Normal effort  MSK:   Moves extremities without difficulty  Other:  Alert and oriented x3.  Negative pronator drift.  Decreased sensation to left face, left arm, left leg.  Strength intact to bilateral upper extremities bilateral lower extremities.   Medical Decision Making  Medically screening exam initiated at 7:39 PM.  Appropriate orders placed.  Sonia Skinner was informed that the remainder of the evaluation will be completed by another provider, this initial triage assessment does not replace that evaluation, and the importance of remaining in the ED until their evaluation is complete.  7:47 PM - Discussed with RN that patient is in need of a room immediately. RN aware and working on room placement.    Shellie Goettl A, PA-C 08/13/21 1947    Sherwood Gambler, MD 08/13/21 2232

## 2021-08-13 NOTE — ED Triage Notes (Addendum)
Pt here POV with c/o of possible stroke like symptoms. Dullness on left side. No other deficits noted. LKW 1700.  History of left MCA infarct 2015

## 2021-08-13 NOTE — ED Provider Notes (Signed)
[facility] EMERGENCY DEPARTMENT Provider Note   CSN: [id number] Arrival date & time: 08/13/21  1848     History Chief Complaint  Patient presents with   Numbness   Code Stroke    Sonia Skinner is a 70 y.o. female.  Patient presents to the emergency department for evaluation of left-sided numbness.  Patient reports that the left side of her face, left arm and left leg are tingling and numb, sensation is different than on the right side of her body.  Symptoms began around 5 PM.  Patient has not noticed any clumsiness or inability to move her extremities.  She has not had any difficulty with speech.      Past Medical History:  Diagnosis Date   Ankle fracture    Left   Anxiety    Carotid artery occlusion    Closed fracture of left distal fibula 02/13/6966   Complication of anesthesia    ALLERY TO ESTER BASE   Depression    early 30s   Diabetes mellitus    INSULIN DEPENDENT   GERD (gastroesophageal reflux disease)    Headache    years ago   Hypertension    Pneumonia    Stroke [facility]) March 2015    left MCA infarct, slight weakness on left side   Thyroid disease     Patient Active Problem List   Diagnosis Date Noted   Musculoskeletal back pain 08/03/2021   Encounter for general adult medical examination with abnormal findings 04/08/2021   Deficiency anemia 04/24/2020   Stage 3b chronic kidney disease ([place]) 04/02/2020   Visit for screening mammogram 04/01/2020   Type 2 diabetes mellitus with complication, with long-term current use of insulin ([place]) 09/18/2018   Age-related osteoporosis with current pathological fracture 02/20/2018   Intractable migraine without aura and with status migrainosus 11/24/2016   Left thyroid nodule 05/05/2016   GERD (gastroesophageal reflux disease) 03/08/2016   Major depressive disorder, recurrent episode ([place]) 02/20/2016   Occlusion and stenosis of carotid artery with cerebral infarction 11/25/2013   Cerebrovascular  accident (CVA) due to stenosis of left middle cerebral artery ([place]) 11/02/2013   Vitamin D deficiency 10/19/2013   Tobacco abuse 07/31/2013   Essential hypertension, benign 04/20/2013   Type II diabetes mellitus with manifestations ([place]) 04/20/2013   Hypothyroidism 04/20/2013   Hyperlipidemia with target LDL less than 70 04/20/2013    Past Surgical History:  Procedure Laterality Date   ABDOMINAL HYSTERECTOMY     ANTERIOR FIXATION AND POSTERIOR MICRODISCECTOMY CERVICAL SPINE  1999   CAROTID ENDARTERECTOMY Left 11-04-13   cea   ENDARTERECTOMY Left 11/04/2013   ORIF ANKLE FRACTURE Left 09/16/2017   Procedure: OPEN REDUCTION INTERNAL FIXATION (ORIF) LEFT ANKLE FRACTURE;  Surgeon: Marchia Bond, MD;  Location: [place];  Service: Orthopedics;  Laterality: Left;     OB History   No obstetric history on file.     Family History  Problem Relation Age of Onset   Diabetes Mother    Heart disease Mother        Before age 27   Cancer Father        Lung   Hypertension Sister    Diabetes Sister    Diabetes Sister    Diabetes Sister     Social History   Tobacco Use   Smoking status: Former    Packs/day: 0.20    Years: 20.00    Pack years: 4.00    Types: Cigarettes    Quit date: 11/01/2013  Years since quitting: 7.7   Smokeless tobacco: Never   Tobacco comments:    Reports done to 1-2 a day.   Vaping Use   Vaping Use: Never used  Substance Use Topics   Alcohol use: Yes    Alcohol/week: 1.0 - 2.0 standard drink    Types: 1 - 2 Standard drinks or equivalent per week   Drug use: No    Home Medications Prior to Admission medications   Medication Sig Start Date End Date Taking? Authorizing Provider  Accu-Chek FastClix Lancets MISC Use to check blood sugar five times daily. 04/21/21   [name], MD  Acetaminophen-Codeine (TYLENOL/CODEINE #3) 300-30 MG tablet Take 1 tablet by mouth every 6 (six) hours as needed for pain. 08/03/21   Horald Pollen, MD  amitriptyline  (ELAVIL) 100 MG tablet Take 1 tablet (100 mg total) by mouth at bedtime. 07/23/21   Arfeen, Arlyce Harman, MD  aspirin 325 MG tablet Take 1 tablet (325 mg total) by mouth daily. 11/06/13   Samella Parr, NP  atorvastatin (LIPITOR) 40 MG tablet Take 1 tablet (40 mg total) by mouth daily. 04/08/21   [name], MD  blood glucose meter kit and supplies KIT Use to test blood sugars 04/27/19   [name], MD  buPROPion (WELLBUTRIN XL) 300 MG 24 hr tablet Take 1 tablet (300 mg total) by mouth daily. 07/23/21   Arfeen, Arlyce Harman, MD  Continuous Blood Gluc Receiver (FREESTYLE LIBRE 2 READER) DEVI 1 Act by Does not apply route daily. Patient not taking: Reported on 08/04/2021 11/27/20   [name], MD  Continuous Blood Gluc Sensor (FREESTYLE LIBRE 2 SENSOR) MISC 1 Act by Does not apply route daily. Patient not taking: Reported on 08/04/2021 11/27/20   [name], MD  Cyanocobalamin (VITAMIN B-12 CR) 1500 MCG TBCR Take 1 tablet (1,500 mcg total) by mouth daily. Patient not taking: Reported on 08/04/2021 09/26/18   [name], MD  dapagliflozin propanediol (FARXIGA) 10 MG TABS tablet Take 1 tablet (10 mg total) by mouth daily before breakfast. 04/08/21   [name], MD  dexlansoprazole (DEXILANT) 60 MG capsule Take 1 capsule by mouth daily. 05/26/21   [name], MD  EDARBYCLOR 40-12.5 MG TABS Take 1 tablet by mouth daily. 06/25/21   [name], MD  gabapentin (NEURONTIN) 100 MG capsule Take 1 capsule by mouth twice daily.  11/28/19   [name], MD  glucose blood (ACCU-CHEK GUIDE) test strip Use to check blood sugars five times daily. 04/21/21   [name], MD  hydrALAZINE (APRESOLINE) 25 MG tablet Take 1 tablet by mouth in the morning and at bedtime. 09/27/20   [name], MD  insulin lispro (HUMALOG KWIKPEN) 200 UNIT/ML KwikPen Inject 10 Units into the skin 3 (three) times daily with meals as needed. Patient not taking: Reported on 08/04/2021 04/02/20   [name], MD   Insulin Pen Needle 32G X 4 MM MISC Use to administer insulin four times a day 04/02/20   [name], MD  levothyroxine (SYNTHROID) 112 MCG tablet Take 1 tablet by mouth daily before breakfast. 06/25/21   [name], MD  loratadine (CLARITIN) 10 MG tablet Take 1 tablet (10 mg total) by mouth daily. 04/21/21   [name], MD  Semaglutide, 2 MG/DOSE, 8 MG/3ML SOPN Inject 2 mg as directed once a week. 04/08/21   [name], MD  TOUJEO SOLOSTAR 300 UNIT/ML Solostar  Pen Inject 50 units under the skin every morning. 06/21/21   Etta Grandchild, MD    Allergies    Anesthetics, ester  Review of Systems   Review of Systems  Neurological:  Positive for numbness.  All other systems reviewed and are negative.  Physical Exam Updated Vital Signs BP (!) 147/52    Pulse 85    Temp 98.2 F (36.8 C) (Oral)    Resp 16    Ht 5\' 2"  (1.575 m)    Wt 57.2 kg    SpO2 100%    BMI 23.05 kg/m   Physical Exam Vitals and nursing note reviewed.  Constitutional:      General: She is not in acute distress.    Appearance: Normal appearance. She is well-developed.  HENT:     Head: Normocephalic and atraumatic.     Right Ear: Hearing normal.     Left Ear: Hearing normal.     Nose: Nose normal.  Eyes:     Conjunctiva/sclera: Conjunctivae normal.     Pupils: Pupils are equal, round, and reactive to light.  Cardiovascular:     Rate and Rhythm: Regular rhythm.     Heart sounds: S1 normal and S2 normal. No murmur heard.   No friction rub. No gallop.  Pulmonary:     Effort: Pulmonary effort is normal. No respiratory distress.     Breath sounds: Normal breath sounds.  Chest:     Chest wall: No tenderness.  Abdominal:     General: Bowel sounds are normal.     Palpations: Abdomen is soft.     Tenderness: There is no abdominal tenderness. There is no guarding or rebound. Negative signs include Murphy's sign and McBurney's sign.     Hernia: No hernia is present.  Musculoskeletal:        General:  Normal range of motion.     Cervical back: Normal range of motion and neck supple.  Skin:    General: Skin is warm and dry.     Findings: No rash.  Neurological:     Mental Status: She is alert and oriented to person, place, and time.     GCS: GCS eye subscore is 4. GCS verbal subscore is 5. GCS motor subscore is 6.     Cranial Nerves: No cranial nerve deficit.     Sensory: Sensory deficit present.     Motor: Motor function is intact. No weakness.     Coordination: Coordination normal.     Comments: Subjective decreased sensation left face, left arm, left leg  Psychiatric:        Speech: Speech normal.        Behavior: Behavior normal.        Thought Content: Thought content normal.    ED Results / Procedures / Treatments   Labs (all labs ordered are listed, but only abnormal results are displayed) Labs Reviewed  CBC - Abnormal; Notable for the following components:      Result Value   WBC 12.2 (*)    Hemoglobin 11.1 (*)    HCT 35.4 (*)    All other components within normal limits  COMPREHENSIVE METABOLIC PANEL - Abnormal; Notable for the following components:   Glucose, Bld 214 (*)    Creatinine, Ser 1.83 (*)    AST 13 (*)    GFR, Estimated 29 (*)    All other components within normal limits  I-STAT CHEM 8, ED - Abnormal; Notable for the following components:  Creatinine, Ser 1.80 (*)    Glucose, Bld 210 (*)    All other components within normal limits  CBG MONITORING, ED - Abnormal; Notable for the following components:   Glucose-Capillary 214 (*)    All other components within normal limits  RESP PANEL BY RT-PCR (FLU A&B, COVID) ARPGX2  ETHANOL  PROTIME-INR  APTT  DIFFERENTIAL  RAPID URINE DRUG SCREEN, HOSP PERFORMED  URINALYSIS, ROUTINE W REFLEX MICROSCOPIC    EKG None  Radiology CT HEAD CODE STROKE WO CONTRAST  Result Date: 08/13/2021 CLINICAL DATA:  Code stroke. Acute neuro deficit. Left-sided weakness. EXAM: CT HEAD WITHOUT CONTRAST TECHNIQUE:  Contiguous axial images were obtained from the base of the skull through the vertex without intravenous contrast. COMPARISON:  CT head 04/09/2020 FINDINGS: Brain: Mild cerebral volume loss, typical for age. Ventricle size normal. Chronic infarct left high parietal lobe unchanged. Negative for acute infarct, acute hemorrhage, or mass lesion. Vascular: Negative for hyperdense vessel Skull: Negative Sinuses/Orbits: Mild mucosal edema paranasal sinuses. Bilateral cataract extraction Other: None ASPECTS ([place] Stroke Program Early CT Score) - Ganglionic level infarction (caudate, lentiform nuclei, internal capsule, insula, M1-M3 cortex): 7 - Supraganglionic infarction (M4-M6 cortex): 3 Total score (0-10 with 10 being normal): 10 IMPRESSION: 1. No acute abnormality 2. ASPECTS is 10 3. Code stroke imaging results were communicated on 08/13/2021 at 8:01 pm to provider [place] via text page Electronically Signed   By: Franchot Gallo M.D.   On: 08/13/2021 20:09    Procedures Procedures   Medications Ordered in ED Medications - No data to display  ED Course  I have reviewed the triage vital signs and the nursing notes.  Pertinent labs & imaging results that were available during my care of the patient were reviewed by me and considered in my medical decision making (see chart for details).    MDM Rules/Calculators/A&P                         Patient presents to the emergency department with altered sensation on the left side of her face.  Patient does not have any facial asymmetry on exam, no weakness of her left upper or left lower extremity.  Patient was initiated as a code stroke through triage.  CT head did not show any acute abnormality.  Patient does have a history of stroke with chronic infarct noted in the left parietal lobe on CT.  No acute stroke or hemorrhage noted.  As per neurology, will perform MRI and then determine disposition based on results. Will sign out to oncoming ER physician to  follow up  MRI.     Final Clinical Impression(s) / ED Diagnoses Final diagnoses:  Numbness    Rx / DC Orders ED Discharge Orders     None        Sarayu Prevost, Gwenyth Allegra, MD 08/13/21 2343

## 2021-08-13 NOTE — Consult Note (Signed)
NEURO HOSPITALIST CONSULT NOTE   Requestig physician: Dr. Betsey Holiday  Reason for Consult:Acute onset of left face, arm and leg numbness  History obtained from:  EMS, Patient and Chart     HPI:                                                                                                                                          Sonia Skinner is an 70 y.o. female with a PMHx of stroke in 2015 with residual left sided weakness, anxiety, left carotid artery occlusion, prior left CEA, DM, HTN, prior cervical spine anterior fixation with posterior microdiscectomy and thyroid disease who presents to the ED with a c/c of acute onset of sensory "dullness" on her entire left side. LKN was at 1700, which is when symptoms started. She denies new weakness. She does endorse old left sided facial weakness from her prior stroke, but denies chronic left upper or lower extremity weakness. However, as she was examined and her mild left upper extremity weakness was evident, she then did endorse some mild left upper extremity weakness as a chronic sequela of her prior stroke.   Home medications include ASA and atorvastatin.   Past Medical History:  Diagnosis Date   Ankle fracture    Left   Anxiety    Carotid artery occlusion    Closed fracture of left distal fibula 2/0/9470   Complication of anesthesia    ALLERY TO ESTER BASE   Depression    early 30s   Diabetes mellitus    INSULIN DEPENDENT   GERD (gastroesophageal reflux disease)    Headache    years ago   Hypertension    Pneumonia    Stroke [facility] March 2015    left MCA infarct, slight weakness on left side   Thyroid disease     Past Surgical History:  Procedure Laterality Date   ABDOMINAL HYSTERECTOMY     ANTERIOR FIXATION AND POSTERIOR MICRODISCECTOMY CERVICAL SPINE  1999   CAROTID ENDARTERECTOMY Left 11-04-13   cea   ENDARTERECTOMY Left 11/04/2013   ORIF ANKLE FRACTURE Left 09/16/2017   Procedure: OPEN REDUCTION INTERNAL  FIXATION (ORIF) LEFT ANKLE FRACTURE;  Surgeon: Marchia Bond, MD;  Location: [place];  Service: Orthopedics;  Laterality: Left;    Family History  Problem Relation Age of Onset   Diabetes Mother    Heart disease Mother        Before age 68   Cancer Father        Lung   Hypertension Sister    Diabetes Sister    Diabetes Sister    Diabetes Sister               Social History:  reports that she quit smoking about 7 years ago. Her  smoking use included cigarettes. She has a 4.00 pack-year smoking history. She has never used smokeless tobacco. She reports current alcohol use of about 1.0 - 2.0 standard drink per week. She reports that she does not use drugs.  Allergies  Allergen Reactions   Anesthetics, Ester Anaphylaxis    HOME MEDICATIONS:                                                                                                                      No current facility-administered medications on file prior to encounter.   Current Outpatient Medications on File Prior to Encounter  Medication Sig Dispense Refill   Accu-Chek FastClix Lancets MISC Use to check blood sugar five times daily. 510 each 2   Acetaminophen-Codeine (TYLENOL/CODEINE #3) 300-30 MG tablet Take 1 tablet by mouth every 6 (six) hours as needed for pain. 12 tablet 0   amitriptyline (ELAVIL) 100 MG tablet Take 1 tablet (100 mg total) by mouth at bedtime. 90 tablet 0   aspirin 325 MG tablet Take 1 tablet (325 mg total) by mouth daily.     atorvastatin (LIPITOR) 40 MG tablet Take 1 tablet (40 mg total) by mouth daily. 90 tablet 1   blood glucose meter kit and supplies KIT Use to test blood sugars 1 each 0   buPROPion (WELLBUTRIN XL) 300 MG 24 hr tablet Take 1 tablet (300 mg total) by mouth daily. 90 tablet 0   Continuous Blood Gluc Receiver (FREESTYLE LIBRE 2 READER) DEVI 1 Act by Does not apply route daily. (Patient not taking: Reported on 08/04/2021) 2 each 5   Continuous Blood Gluc Sensor (FREESTYLE LIBRE 2  SENSOR) MISC 1 Act by Does not apply route daily. (Patient not taking: Reported on 08/04/2021) 2 each 5   Cyanocobalamin (VITAMIN B-12 CR) 1500 MCG TBCR Take 1 tablet (1,500 mcg total) by mouth daily. (Patient not taking: Reported on 08/04/2021) 90 tablet 1   dapagliflozin propanediol (FARXIGA) 10 MG TABS tablet Take 1 tablet (10 mg total) by mouth daily before breakfast. 90 tablet 1   dexlansoprazole (DEXILANT) 60 MG capsule Take 1 capsule by mouth daily. 90 capsule 0   EDARBYCLOR 40-12.5 MG TABS Take 1 tablet by mouth daily. 90 tablet 0   gabapentin (NEURONTIN) 100 MG capsule Take 1 capsule by mouth twice daily.  180 capsule 0   glucose blood (ACCU-CHEK GUIDE) test strip Use to check blood sugars five times daily. 500 each 2   hydrALAZINE (APRESOLINE) 25 MG tablet Take 1 tablet by mouth in the morning and at bedtime. 180 tablet 0   insulin lispro (HUMALOG KWIKPEN) 200 UNIT/ML KwikPen Inject 10 Units into the skin 3 (three) times daily with meals as needed. (Patient not taking: Reported on 08/04/2021) 9 mL 1   Insulin Pen Needle 32G X 4 MM MISC Use to administer insulin four times a day 360 each 3   levothyroxine (SYNTHROID) 112 MCG tablet Take 1 tablet by mouth daily before breakfast. 90 tablet 0  loratadine (CLARITIN) 10 MG tablet Take 1 tablet (10 mg total) by mouth daily. 90 tablet 0   Semaglutide, 2 MG/DOSE, 8 MG/3ML SOPN Inject 2 mg as directed once a week. 9 mL 1   TOUJEO SOLOSTAR 300 UNIT/ML Solostar Pen Inject 50 units under the skin every morning. 9 mL 0     ROS:                                                                                                                                       As per HPI. Detailed ROS deferred due to acuity of presentation.   Blood pressure (!) 164/51, pulse 93, temperature 98.2 F (36.8 C), temperature source Oral, resp. rate 20, height $RemoveBe'5\' 2"'mahjxIpRG$  (1.575 m), weight 57.2 kg, SpO2 100 %.   General Examination:                                                                                                        Physical Exam  HEENT-  [place]/AT   Lungs- Respirations unlabored Extremities- No edema  Neurological Examination Mental Status: Awake, alert and oriented. Speech fluent with intact comprehension and naming.  Cranial Nerves: II: Temporal visual fields intact with no extinction to DSS. PERRL.   III,IV, VI: No ptosis. EOMI.  V: Decreased temp sensation on the left VII: Left facial droop.  VIII: Hearing intact to conversation IX,X: Palate elevates symmetrically XI: Head is midline  XII: Midline tongue extension Motor: RUE 5/5 except 4/5 deltoid RLE 5/5 LUE 4+/5 proximally and distally LLE 5/5 No pronator drift Sensory: Decreased temp sensation to LUE and LLE. FT intact x 4. No extinction to DSS.  Deep Tendon Reflexes: 2+ and symmetric throughout Plantars: Right: downgoing   Left: downgoing Cerebellar: No ataxia with FNF bilaterally. H-S normal bilaterally  Gait: Deferred due to acuity of presentation   Lab Results: Basic Metabolic Panel: Recent Labs  Lab 08/13/21 1948 08/13/21 1955  NA 138 141  K 3.8 3.9  CL 106 106  CO2 22  --   GLUCOSE 214* 210*  BUN 18 22  CREATININE 1.83* 1.80*  CALCIUM 9.4  --     CBC: Recent Labs  Lab 08/13/21 1948 08/13/21 1955  WBC 12.2*  --   NEUTROABS 7.5  --   HGB 11.1* 12.2  HCT 35.4* 36.0  MCV 88.3  --   PLT 242  --     Cardiac Enzymes: No results for input(s): CKTOTAL, CKMB, CKMBINDEX, TROPONINI in the  last 168 hours.  Lipid Panel: No results for input(s): CHOL, TRIG, HDL, CHOLHDL, VLDL, LDLCALC in the last 168 hours.  Imaging: CT HEAD CODE STROKE WO CONTRAST  Result Date: 08/13/2021 CLINICAL DATA:  Code stroke. Acute neuro deficit. Left-sided weakness. EXAM: CT HEAD WITHOUT CONTRAST TECHNIQUE: Contiguous axial images were obtained from the base of the skull through the vertex without intravenous contrast. COMPARISON:  CT head 04/09/2020 FINDINGS: Brain: Mild cerebral  volume loss, typical for age. Ventricle size normal. Chronic infarct left high parietal lobe unchanged. Negative for acute infarct, acute hemorrhage, or mass lesion. Vascular: Negative for hyperdense vessel Skull: Negative Sinuses/Orbits: Mild mucosal edema paranasal sinuses. Bilateral cataract extraction Other: None ASPECTS ([place] Stroke Program Early CT Score) - Ganglionic level infarction (caudate, lentiform nuclei, internal capsule, insula, M1-M3 cortex): 7 - Supraganglionic infarction (M4-M6 cortex): 3 Total score (0-10 with 10 being normal): 10 IMPRESSION: 1. No acute abnormality 2. ASPECTS is 10 3. Code stroke imaging results were communicated on 08/13/2021 at 8:01 pm to provider Dmarius Reeder via text page Electronically Signed   By: Franchot Gallo M.D.   On: 08/13/2021 20:09     Assessment: 70 y.o. female with a PMHx of stroke in 2015 with residual left sided weakness, anxiety, left carotid artery occlusion, prior left CEA, DM, HTN, prior cervical spine anterior fixation with posterior microdiscectomy and thyroid disease who presents to the ED with a c/c of acute onset of sensory "dullness" on her entire left side. LKN was at 1700, which is when symptoms started. She denies new weakness. She does endorse old left sided facial weakness from her prior stroke, but denies chronic left upper or lower extremity weakness. Home medications include ASA and atorvastatin. 1. Exam reveals mild left facial droop and LUE weakness which the patient states are from her prior stroke. There is mild left sided sensory deficit which the patient states is new.  2. DDx includes a small thalamic stroke versus recrudescence of chronic deficits from old stroke due to intercurrent illness or electrolyte disturbance.  3. CT head: No acute abnormality. ASPECTS is 10. 4. Stroke risk factors: Prior stroke, carotid artery disease, DM and HTN 5. Impaired renal function and hyperglycemia on labs.  6. Mild leukocytosis and anemia on  CBC 7. History of elevated TSH on several blood draws in the past, including a TSH of 33 in August of this year  Recommendations: 1. HgbA1c, fasting lipid panel 2. MRI/MRA of the brain without contrast 3. PT consult, OT consult, Speech consult 4. Echocardiogram 5. Carotid ultrasound 6. Continue ASA and atorvastatin. Obtain CK level.  7. Risk factor modification 8. Telemetry monitoring 9. Frequent neuro checks 10. NPO until passes stroke swallow screen 11. Evaluation and management of renal impairment. No history of such listed in Epic.  12. ID work up given mild leukocytosis.  13. TSH level.     Electronically signed: Dr. Kerney Elbe 08/13/2021, 8:36 PM

## 2021-08-14 ENCOUNTER — Emergency Department (HOSPITAL_COMMUNITY): Payer: Medicare Other

## 2021-08-14 ENCOUNTER — Encounter (HOSPITAL_COMMUNITY): Payer: Self-pay | Admitting: Family Medicine

## 2021-08-14 ENCOUNTER — Observation Stay (HOSPITAL_COMMUNITY): Payer: Medicare Other

## 2021-08-14 ENCOUNTER — Observation Stay (HOSPITAL_BASED_OUTPATIENT_CLINIC_OR_DEPARTMENT_OTHER): Payer: Medicare Other

## 2021-08-14 DIAGNOSIS — I10 Essential (primary) hypertension: Secondary | ICD-10-CM

## 2021-08-14 DIAGNOSIS — I63233 Cerebral infarction due to unspecified occlusion or stenosis of bilateral carotid arteries: Secondary | ICD-10-CM | POA: Diagnosis not present

## 2021-08-14 DIAGNOSIS — E785 Hyperlipidemia, unspecified: Secondary | ICD-10-CM

## 2021-08-14 DIAGNOSIS — R29818 Other symptoms and signs involving the nervous system: Secondary | ICD-10-CM | POA: Diagnosis not present

## 2021-08-14 DIAGNOSIS — G459 Transient cerebral ischemic attack, unspecified: Secondary | ICD-10-CM | POA: Diagnosis present

## 2021-08-14 DIAGNOSIS — Z9889 Other specified postprocedural states: Secondary | ICD-10-CM | POA: Diagnosis not present

## 2021-08-14 DIAGNOSIS — Z8673 Personal history of transient ischemic attack (TIA), and cerebral infarction without residual deficits: Secondary | ICD-10-CM | POA: Diagnosis not present

## 2021-08-14 DIAGNOSIS — E118 Type 2 diabetes mellitus with unspecified complications: Secondary | ICD-10-CM

## 2021-08-14 LAB — CBG MONITORING, ED
Glucose-Capillary: 37 mg/dL — CL (ref 70–99)
Glucose-Capillary: 274 mg/dL — ABNORMAL HIGH (ref 70–99)
Glucose-Capillary: 265 mg/dL — ABNORMAL HIGH (ref 70–99)
Glucose-Capillary: 151 mg/dL — ABNORMAL HIGH (ref 70–99)

## 2021-08-14 LAB — ECHOCARDIOGRAM COMPLETE
AV Mean grad: 4 mmHg
AV Peak grad: 7.7 mmHg
Ao pk vel: 1.39 m/s
Area-P 1/2: 2.84 cm2
Height: 62 in
S' Lateral: 2.2 cm
Weight: 2016 oz

## 2021-08-14 LAB — GLUCOSE, CAPILLARY: Glucose-Capillary: 182 mg/dL — ABNORMAL HIGH (ref 70–99)

## 2021-08-14 MED ORDER — ACETAMINOPHEN 325 MG PO TABS
650.0000 mg | ORAL_TABLET | ORAL | Status: DC | PRN
  Administered 2021-08-14 – 2021-08-15 (×2): 650 mg via ORAL
  Filled 2021-08-14 (×2): qty 2

## 2021-08-14 MED ORDER — AMITRIPTYLINE HCL 100 MG PO TABS: 100.0000 mg | ORAL_TABLET | Freq: Every day | ORAL | Status: DC

## 2021-08-14 MED ORDER — ENOXAPARIN SODIUM 30 MG/0.3ML IJ SOSY
30.0000 mg | PREFILLED_SYRINGE | INTRAMUSCULAR | Status: DC
  Administered 2021-08-14 – 2021-08-15 (×2): 30 mg via SUBCUTANEOUS
  Filled 2021-08-14 (×2): qty 0.3

## 2021-08-14 MED ORDER — SENNOSIDES-DOCUSATE SODIUM 8.6-50 MG PO TABS: 1.0000 | ORAL_TABLET | Freq: Every evening | ORAL | Status: DC | PRN

## 2021-08-14 MED ORDER — INSULIN ASPART 100 UNIT/ML IJ SOLN
0.0000 [IU] | Freq: Three times a day (TID) | INTRAMUSCULAR | Status: DC
Start: 2021-08-14 — End: 2021-08-15
  Administered 2021-08-15: 3 [IU] via SUBCUTANEOUS

## 2021-08-14 MED ORDER — ACETAMINOPHEN 160 MG/5ML PO SOLN: 650.0000 mg | ORAL | Status: DC | PRN

## 2021-08-14 MED ORDER — LEVOTHYROXINE SODIUM 112 MCG PO TABS
112.0000 ug | ORAL_TABLET | Freq: Every day | ORAL | Status: DC
  Administered 2021-08-15: 112 ug via ORAL
  Filled 2021-08-14: qty 1

## 2021-08-14 MED ORDER — BUPROPION HCL ER (XL) 150 MG PO TB24
300.0000 mg | ORAL_TABLET | Freq: Every day | ORAL | Status: DC
  Administered 2021-08-14 – 2021-08-15 (×2): 300 mg via ORAL
  Filled 2021-08-14: qty 2
  Filled 2021-08-14: qty 1

## 2021-08-14 MED ORDER — PANTOPRAZOLE SODIUM 40 MG PO TBEC
40.0000 mg | DELAYED_RELEASE_TABLET | Freq: Every day | ORAL | Status: DC
  Administered 2021-08-14 – 2021-08-15 (×2): 40 mg via ORAL
  Filled 2021-08-14 (×2): qty 1

## 2021-08-14 MED ORDER — GABAPENTIN 100 MG PO CAPS
100.0000 mg | ORAL_CAPSULE | Freq: Two times a day (BID) | ORAL | Status: DC
  Administered 2021-08-14 – 2021-08-15 (×3): 100 mg via ORAL
  Filled 2021-08-14 (×3): qty 1

## 2021-08-14 MED ORDER — SODIUM CHLORIDE 0.9 % IV SOLN: INTRAVENOUS | Status: DC

## 2021-08-14 MED ORDER — DEXTROSE 10 % IV SOLN: INTRAVENOUS | Status: DC

## 2021-08-14 MED ORDER — ATORVASTATIN CALCIUM 40 MG PO TABS
40.0000 mg | ORAL_TABLET | Freq: Every day | ORAL | Status: DC
  Administered 2021-08-14 – 2021-08-15 (×2): 40 mg via ORAL
  Filled 2021-08-14 (×2): qty 1

## 2021-08-14 MED ORDER — ACETAMINOPHEN 650 MG RE SUPP: 650.0000 mg | RECTAL | Status: DC | PRN

## 2021-08-14 MED ORDER — LORATADINE 10 MG PO TABS
10.0000 mg | ORAL_TABLET | Freq: Every day | ORAL | Status: DC
  Administered 2021-08-14 – 2021-08-15 (×2): 10 mg via ORAL
  Filled 2021-08-14 (×2): qty 1

## 2021-08-14 MED ORDER — INSULIN GLARGINE-YFGN 100 UNIT/ML ~~LOC~~ SOLN
25.0000 [IU] | Freq: Every day | SUBCUTANEOUS | Status: DC
  Administered 2021-08-14 – 2021-08-15 (×2): 25 [IU] via SUBCUTANEOUS
  Filled 2021-08-14 (×2): qty 0.25

## 2021-08-14 MED ORDER — INSULIN ASPART 100 UNIT/ML IJ SOLN: 0.0000 [IU] | Freq: Every day | INTRAMUSCULAR | Status: DC

## 2021-08-14 MED ORDER — DEXTROSE 50 % IV SOLN
50.0000 mL | Freq: Once | INTRAVENOUS | Status: AC
  Administered 2021-08-14: 01:00:00 50 mL via INTRAVENOUS
  Filled 2021-08-14: qty 50

## 2021-08-14 MED ORDER — STROKE: EARLY STAGES OF RECOVERY BOOK
Freq: Once | Status: AC
  Filled 2021-08-14: qty 1

## 2021-08-14 MED ORDER — ASPIRIN 325 MG PO TABS
325.0000 mg | ORAL_TABLET | Freq: Every day | ORAL | Status: DC
  Administered 2021-08-14 – 2021-08-15 (×2): 325 mg via ORAL
  Filled 2021-08-14 (×2): qty 1

## 2021-08-14 NOTE — ED Notes (Signed)
Patient transported to MRI 

## 2021-08-14 NOTE — Assessment & Plan Note (Signed)
-  TSH was markedly abnormal at last check -Will recheck -Continue Synthroid at current dose for now

## 2021-08-14 NOTE — Evaluation (Signed)
Occupational Therapy Evaluation Patient Details Name: Sonia Skinner MRN: [id number] DOB: 1951/08/03 Today's Date: 08/14/2021   History of Present Illness This 70 y.o. female admitted with sensory dullness of entire left side.  MRA with no LVO, MRI of brain showed 1. No acute or reversible finding.2. Remote left frontoparietal cortex infarct. PMH includes: CVA, ankle fx, anxiety, carotid artery occlusion s/p carotid endarterectomy, DM, HTN   Clinical Impression   Patient evaluated by Occupational Therapy with no further acute OT needs identified. All education has been completed and the patient has no further questions. Pt is able to complete ADLs and functional mobility independently.  She continues to endorse a dullness sensation on the Lt, but it does not impact her functionally.  See below for any follow-up Occupational Therapy or equipment needs. OT is signing off. Thank you for this referral.       Recommendations for follow up therapy are one component of a multi-disciplinary discharge planning process, led by the attending physician.  Recommendations may be updated based on patient status, additional functional criteria and insurance authorization.   Follow Up Recommendations  No OT follow up    Assistance Recommended at Discharge None  Functional Status Assessment  Patient has not had a recent decline in their functional status  Equipment Recommendations  None recommended by OT    Recommendations for Other Services       Precautions / Restrictions Precautions Precautions: None      Mobility Bed Mobility Overal bed mobility: Independent             General bed mobility comments: on ED stretcher    Transfers Overall transfer level: Independent                        Balance Overall balance assessment: No apparent balance deficits (not formally assessed)                                         ADL either performed or assessed with  clinical judgement   ADL Overall ADL's : Independent                                             Vision Baseline Vision/History: 0 No visual deficits Ability to See in Adequate Light: 0 Adequate Patient Visual Report: No change from baseline Vision Assessment?: Yes Eye Alignment: Within Functional Limits Ocular Range of Motion: Within Functional Limits Alignment/Gaze Preference: Within Defined Limits Tracking/Visual Pursuits: Able to track stimulus in all quads without difficulty Visual Fields: No apparent deficits     Perception     Praxis      Pertinent Vitals/Pain Pain Assessment: No/denies pain     Hand Dominance Left   Extremity/Trunk Assessment Upper Extremity Assessment Upper Extremity Assessment: Overall WFL for tasks assessed (Pt able to sign name legibly, but reports intermittent tremors that reduce legibility.  She endorses continued dullness Lt UE, but does not imped her  function)   Lower Extremity Assessment Lower Extremity Assessment: Overall WFL for tasks assessed   Cervical / Trunk Assessment Cervical / Trunk Assessment: Normal   Communication Communication Communication: No difficulties   Cognition Arousal/Alertness: Awake/alert Behavior During Therapy: WFL for tasks assessed/performed Overall Cognitive Status: Within Functional Limits for tasks assessed  General Comments       Exercises     Shoulder Instructions      Home Living Family/patient expects to be discharged to:: Private residence Living Arrangements: Other relatives (sister) Available Help at Discharge: Family;Available 24 hours/day Type of Home: House Home Access: Level entry     Home Layout: One level     Bathroom Shower/Tub: Teacher, early years/pre: Standard     Home Equipment: None          Prior Functioning/Environment Prior Level of Function : Independent/Modified  Independent;Working/employed;Driving               ADLs Comments: Pt works as a Oceanographer every day        OT Problem List: Impaired sensation      OT Treatment/Interventions:      OT Goals(Current goals can be found in the care plan section) Acute Rehab OT Goals Patient Stated Goal: to go home OT Goal Formulation: All assessment and education complete, DC therapy  OT Frequency:     Barriers to D/C:            Co-evaluation              AM-PAC OT "6 Clicks" Daily Activity     Outcome Measure Help from another person eating meals?: None Help from another person taking care of personal grooming?: None Help from another person toileting, which includes using toliet, bedpan, or urinal?: None Help from another person bathing (including washing, rinsing, drying)?: None Help from another person to put on and taking off regular upper body clothing?: None Help from another person to put on and taking off regular lower body clothing?: None 6 Click Score: 24   End of Session Nurse Communication: Mobility status  Activity Tolerance: Patient tolerated treatment well Patient left: in bed;with call bell/phone within reach;with family/visitor present  OT Visit Diagnosis: Other (comment) (reduced sensation)                Time: 9290-9030 OT Time Calculation (min): 23 min Charges:  OT General Charges $OT Visit: 1 Visit OT Evaluation $OT Eval Low Complexity: 1 Low OT Treatments $Therapeutic Activity: 8-22 mins  Nilsa Nutting., OTR/L Acute Rehabilitation Services Pager [phone] Office [phone]   Lucille Passy M 08/14/2021, 12:26 PM

## 2021-08-14 NOTE — Assessment & Plan Note (Signed)
-  Permissive HTN for now -Will treat if SBP >220/120 to decrease by 10-15% -Hold home ARB, thiazide diuretic

## 2021-08-14 NOTE — Progress Notes (Addendum)
STROKE TEAM PROGRESS NOTE   ATTENDING NOTE: I reviewed above note and agree with the assessment and plan. Pt was seen and examined.   70 year old female with history of diabetes, hypertension, hyperlipidemia, memory loss, stroke in 10/2013, status post left CEA admitted for left facial numbness.  She stated that for the last 2 weeks she had left face and mouth intermittent numbness tingling, denies left arm or leg numbness or weakness.  CT no acute abnormality.  MRI no acute infarct.  MRA showed bilateral ICA siphon stenosis.  Carotid Doppler pending, LDL and A1c pending.  UDS pending.  Creatinine 1.80, WBC 12.2.  Patient had a stroke in 10/2013 with blurry vision and right upper extremity weakness.  MRI showed left MCA scattered infarcts around MCA/ACA watershed area.  CTA head and neck showed left ICA 80% stenosis.  She had left MCA done in 10/2013.  On exam today, sister at bedside, patient initially sleeping, but easily arousable and awake alert fully orientated.  No aphasia, no dysarthria, follows simple commands, able to name repeat.  Neuro examination no deficit except mild decreased light touch sensation at left V2 and V3, but not at left V1.  Etiology for patient's symptoms not quite clear, concerning for anxiety, however cannot rule out TIA.  However, she does have risk factors for TIA/stroke, especially with bilateral ICA siphon stenosis and hypertension hyperlipidemia as well as diabetes.  Currently on aspirin 325 and Lipitor 40, we will wait 2D echo and LDL and A1c to decide DAPT and statin dose based on the risk factor profile.  Patient follow-up with Dr. Delice Lesch at [facility] as outpt.   For detailed assessment and plan, please refer to above as I have made changes wherever appropriate.   Rosalin Hawking, MD PhD Stroke Neurology 08/14/2021 8:09 PM    INTERVAL HISTORY Her family is at the bedside.  She is laying comfortably in bed, just returned from MRI. Seen with RN in ED. She states that she  feels a little "cloudy" and that everything feels "dull" on her left face and arm but normal in her legs. When her symptoms initially started yesterday, she felt like she was drooling out of the left side of her mouth. She has diminished hearing and a diminished sense of taste that she states is from her previous stroke. She does also state that she has some left side weakness in her LUE from her previous stroke. Her residual symptoms had worsened and she typically does not have any issues with drooling. She does endorse occasional tingling in her fingertips of both hands that she attributes to peripheral neuropathy. Plan to repeat lipid panel and carotid dopplers.   When she retold her timeline of symptoms she stated that this has been occurring on and off for approximately two weeks. Repeat lipid panel. Order carotid ultrasounds and consider DAPT therapy. Resume ASA 325mg  today.   Vitals:   08/14/21 0700 08/14/21 0900 08/14/21 1045 08/14/21 1100  BP: (!) 138/49 (!) 157/85 (!) 148/66 (!) 151/54  Pulse: 79 89 85 78  Resp: 17 15 16 16   Temp:      TempSrc:      SpO2: 100% 100% 100% 100%  Weight:      Height:       CBC:  Recent Labs  Lab 08/13/21 1948 08/13/21 1955  WBC 12.2*  --   NEUTROABS 7.5  --   HGB 11.1* 12.2  HCT 35.4* 36.0  MCV 88.3  --   PLT 242  --  Basic Metabolic Panel:  Recent Labs  Lab 08/13/21 1948 08/13/21 1955  NA 138 141  K 3.8 3.9  CL 106 106  CO2 22  --   GLUCOSE 214* 210*  BUN 18 22  CREATININE 1.83* 1.80*  CALCIUM 9.4  --    Lipid Panel: No results for input(s): CHOL, TRIG, HDL, CHOLHDL, VLDL, LDLCALC in the last 168 hours. HgbA1c: No results for input(s): HGBA1C in the last 168 hours. Urine Drug Screen: No results for input(s): LABOPIA, COCAINSCRNUR, LABBENZ, AMPHETMU, THCU, LABBARB in the last 168 hours.  Alcohol Level  Recent Labs  Lab 08/13/21 1948  ETH <10    IMAGING past 24 hours MR ANGIO HEAD WO CONTRAST  Result Date:  08/14/2021 CLINICAL DATA:  Stroke. EXAM: MRA HEAD WITHOUT CONTRAST TECHNIQUE: Angiographic images of the Circle of Willis were acquired using MRA technique without intravenous contrast. COMPARISON:  Brain MRI from earlier today.  CTA 11/03/2013 FINDINGS: Anterior circulation: Atheromatous irregularity of the carotid siphons with over 50% stenosis the bilateral cavernous segment. No downstream branch occlusion, beading, or flow limiting stenosis. Negative for aneurysm. Posterior circulation: Vertebral and basilar arteries are smoothly contoured and widely patent. No branch occlusion, beading, or aneurysm Anatomic variants: None significant IMPRESSION: No emergent finding. Atheromatous irregularity of the bilateral cavernous carotids with at least 50% stenosis, appearance stable from 2015. Electronically Signed   By: Jorje Guild M.D.   On: 08/14/2021 10:36   MR BRAIN WO CONTRAST  Result Date: 08/14/2021 CLINICAL DATA:  Acute neuro deficit with stroke suspected. Decreased sensation on the left side. EXAM: MRI HEAD WITHOUT CONTRAST TECHNIQUE: Multiplanar, multiecho pulse sequences of the brain and surrounding structures were obtained without intravenous contrast. COMPARISON:  04/09/2020 FINDINGS: Brain: No acute infarction, hemorrhage, hydrocephalus, extra-axial collection or mass lesion. Small to moderate remote cortically based infarct at the left frontal parietal junction. Age normal brain volume. No hemorrhage, hydrocephalus, or masslike finding Vascular: Normal flow voids Skull and upper cervical spine: Normal marrow signal. C3-4 disc space narrowing and ridging. Sinuses/Orbits: Negative IMPRESSION: 1. No acute or reversible finding. 2. Remote left frontoparietal cortex infarct. Electronically Signed   By: Jorje Guild M.D.   On: 08/14/2021 04:58   CT HEAD CODE STROKE WO CONTRAST  Result Date: 08/13/2021 CLINICAL DATA:  Code stroke. Acute neuro deficit. Left-sided weakness. EXAM: CT HEAD WITHOUT  CONTRAST TECHNIQUE: Contiguous axial images were obtained from the base of the skull through the vertex without intravenous contrast. COMPARISON:  CT head 04/09/2020 FINDINGS: Brain: Mild cerebral volume loss, typical for age. Ventricle size normal. Chronic infarct left high parietal lobe unchanged. Negative for acute infarct, acute hemorrhage, or mass lesion. Vascular: Negative for hyperdense vessel Skull: Negative Sinuses/Orbits: Mild mucosal edema paranasal sinuses. Bilateral cataract extraction Other: None ASPECTS ([place] Stroke Program Early CT Score) - Ganglionic level infarction (caudate, lentiform nuclei, internal capsule, insula, M1-M3 cortex): 7 - Supraganglionic infarction (M4-M6 cortex): 3 Total score (0-10 with 10 being normal): 10 IMPRESSION: 1. No acute abnormality 2. ASPECTS is 10 3. Code stroke imaging results were communicated on 08/13/2021 at 8:01 pm to provider [place] via text page Electronically Signed   By: Franchot Gallo M.D.   On: 08/13/2021 20:09    PHYSICAL EXAM  Temp:  [98.2 F (36.8 C)-98.7 F (37.1 C)] 98.2 F (36.8 C) (12/29 2024) Pulse Rate:  [76-101] 78 (12/30 1100) Resp:  [11-20] 16 (12/30 1100) BP: (135-164)/(40-85) 151/54 (12/30 1100) SpO2:  [96 %-100 %] 100 % (12/30 1100) Weight:  [57.2  kg] 57.2 kg (12/29 2024)  General - Well nourished, well developed, in no apparent distress. Ophthalmologic - fundi not visualized due to noncooperation. Cardiovascular - Regular rhythm and rate.  Mental Status -  Feels weak and "cloudy" Level of arousal and orientation to time, place, and person were intact. Language including expression, naming, repetition, comprehension was assessed and found intact. Attention span and concentration were normal. Recent and remote memory were intact. Fund of Knowledge was assessed and was intact.  Cranial Nerves II - XII - II - Visual field intact OU. III, IV, VI - Extraocular movements intact. V - Facial sensation intact  bilaterally. VII - Facial movement intact bilaterally. VIII - Hearing & vestibular intact bilaterally. X - Palate elevates symmetrically. XI - Chin turning & shoulder shrug intact bilaterally. XII - Tongue protrusion intact.  Motor Strength - The patients strength was normal in RUE and LUE both 5/5, RUE 4/5, LLE 5/5. No pronator drift noted. Bulk was normal and fasciculations were absent.   Motor Tone - Muscle tone was assessed at the neck and appendages and was normal. Reflexes - The patients reflexes were symmetrical in all extremities and she had no pathological reflexes. Sensory - sensation on the left side of the face is dull compared to the right. Sensation in the LUE is dull compared to the RUE. BLE sensation equal and intact. Coordination - The patient had normal movements in the hands and feet with no ataxia or dysmetria.  Tremor was absent. Gait and Station - deferred.  ASSESSMENT/PLAN [name] is a 70 y.o. female with history of stroke in 2015 with residual left sided weakness, anxiety, left carotid artery occlusion, prior left CEA, DM, HTN, prior cervical spine anterior fixation with posterior microdiscectomy and thyroid disease presenting with left side numbness and weakness.  She does endorse the symptoms from her previous stroke however states that they are worsened.  The drooling sensation she previously had has seemed to resolved.  CT had showed no acute abnormality and the MRI showed her previous stroke.  Carotid Dopplers are pending, 2D echo showed EF of 70 to 75% with left ventricle hyperdynamic function.  LDL is 149, hemoglobin A1c is 7.2.  She did have an episode of hypoglycemia and was given D10 until her diet was resumed  TIA vs. anxiety Code Stroke CT - No acute abnormality. ASPECTS 10.    MRI  remote left frontoparietal cortex infarct MRA  Atheromatous irregularity of the bilateral cavernous carotids with at least 50% stenosis, appearance stable from  2015. Carotid Doppler  pending 2D Echo EF 70-75% LV hyperdynamic function LDL pending, in 03/2021 149 HgbA1c pending, in 03/2021 7.2 VTE prophylaxis - Lovenox 30mg  aspirin 325 mg daily prior to admission, now on aspirin 325 mg daily. OK to consider DAPT based on risk factor profile. Therapy recommendations:  Pending, OT no follow up requested Disposition:  pending  Hypertension Home meds:  hydralazine 25mg  Stable Long-term BP goal normotensive  Hyperlipidemia Home meds:  Atorvastatin 40mg , resumed in hospital LDL pending, goal < 70 Continue statin at discharge  Diabetes type II Uncontrolled Home meds:  Toujeo solostar, Farxiga 10 mg HgbA1c pending, goal < 7.0 CBGs SSI Episode of hypoglycemia, started on D10 until diet resumed.   Other Stroke Risk Factors Advanced Age >/= 91  Hx stroke/TIA MCA scattered stroke in 2015 Previous episode of left side weakness after stroke and diminished sensation on the left side Coronary artery disease Previous CEA for ICA stenosis after MCA  stroke on 11/04/2013  Other Active Problems CKD stage III-primary team to manage Hold ARB and [place] Depression/anxiety Resume home medications per primary team  Hospital day # 0  Patient seen and examined by NP/APP with MD. MD to update note as needed.   Janine Ores, DNP, FNP-BC [facility] Pager: [phone]   To contact Stroke Continuity provider, please refer to [email or web address] After hours, contact General Neurology

## 2021-08-14 NOTE — Plan of Care (Signed)

## 2021-08-14 NOTE — ED Provider Notes (Signed)
12:36 AM Assumed care from Dr. Betsey Holiday, please see their note for full history, physical and decision making until this point. In brief this is a 70 y.o. year old female who presented to the ED tonight with Numbness and Code Stroke     Possibly residual L sided symptoms from previous stroke. Today felt something new on left. MRI then talk to neuro.   Neuro wants admission.   Labs, studies and imaging reviewed by myself and considered in medical decision making if ordered. Imaging interpreted by radiology.  Labs Reviewed  CBC - Abnormal; Notable for the following components:      Result Value   WBC 12.2 (*)    Hemoglobin 11.1 (*)    HCT 35.4 (*)    All other components within normal limits  COMPREHENSIVE METABOLIC PANEL - Abnormal; Notable for the following components:   Glucose, Bld 214 (*)    Creatinine, Ser 1.83 (*)    AST 13 (*)    GFR, Estimated 29 (*)    All other components within normal limits  I-STAT CHEM 8, ED - Abnormal; Notable for the following components:   Creatinine, Ser 1.80 (*)    Glucose, Bld 210 (*)    All other components within normal limits  CBG MONITORING, ED - Abnormal; Notable for the following components:   Glucose-Capillary 214 (*)    All other components within normal limits  RESP PANEL BY RT-PCR (FLU A&B, COVID) ARPGX2  ETHANOL  PROTIME-INR  APTT  DIFFERENTIAL  RAPID URINE DRUG SCREEN, HOSP PERFORMED  URINALYSIS, ROUTINE W REFLEX MICROSCOPIC    CT HEAD CODE STROKE WO CONTRAST  Final Result    MR BRAIN WO CONTRAST    (Results Pending)    No follow-ups on file.    Merrily Pew, MD 08/14/21 [phone]

## 2021-08-14 NOTE — Assessment & Plan Note (Signed)
-  LDL was quite high in August but it appears she wasn't taking her statin at that time, will recheck -Continue Lipitor 40 mg daily for now

## 2021-08-14 NOTE — ED Notes (Signed)
Transported to MRI

## 2021-08-14 NOTE — H&P (Signed)
History and Physical    Patient: Sonia Skinner GYF:[id number] DOB: [birth year] DOA: 08/13/2021 DOS: the patient was seen and examined on 08/14/2021 PCP: [name], MD  Patient coming from: Home - lives with sister; NOK: Kenna Gilbert, [phone] (does not request that I call)  Chief Complaint: Stroke-like symptoms  HPI: Sonia Skinner is a 70 y.o. female with medical history significant of DM; HTN; hypothyroidism; and CVA (2015) presenting with stroke-like symptoms.  She reports that she felt really, really bad.  It felt like she was drooling on the left side of her mouth.  She noticed this yesterday.  Symptoms lasted for a few hours but are better now.  Her left lower face was also tingling.  No arm/leg symptoms.  She does feel better now.  She takes 325 mg ASA daily.  She does have diminished hearing that she attributes to the prior stroke and her "taste buds and smell are not as sharp."  Slight headache.    ER Course:  Carryover, per Dr. Tonie Griffith:  70 yo female with TIA. Neurology has seen. Has hx of previous CVA    Review of Systems: ROS Past Medical History:  Diagnosis Date   Ankle fracture    Left   Anxiety    Carotid artery occlusion    Closed fracture of left distal fibula 04/18/5700   Complication of anesthesia    ALLERY TO ESTER BASE   Depression    early 30s   Diabetes mellitus    INSULIN DEPENDENT   GERD (gastroesophageal reflux disease)    Headache    years ago   Hypertension    Pneumonia    Stroke [facility]) March 2015    left MCA infarct, slight weakness on left side   Thyroid disease    Past Surgical History:  Procedure Laterality Date   ABDOMINAL HYSTERECTOMY     ANTERIOR FIXATION AND POSTERIOR MICRODISCECTOMY CERVICAL SPINE  1999   CAROTID ENDARTERECTOMY Left 11-04-13   cea   ENDARTERECTOMY Left 11/04/2013   ORIF ANKLE FRACTURE Left 09/16/2017   Procedure: OPEN REDUCTION INTERNAL FIXATION (ORIF) LEFT ANKLE FRACTURE;  Surgeon: Marchia Bond, MD;   Location: [place];  Service: Orthopedics;  Laterality: Left;   Social History:  reports that she quit smoking about 7 years ago. Her smoking use included cigarettes. She has a 20.00 pack-year smoking history. She has never used smokeless tobacco. She reports that she does not currently use alcohol after a past usage of about 1.0 - 2.0 standard drink per week. She reports that she does not use drugs.  Allergies  Allergen Reactions   Anesthetics, Ester Anaphylaxis    Family History  Problem Relation Age of Onset   Diabetes Mother    Heart disease Mother        Before age 29   Cancer Father        Lung   Hypertension Sister    Diabetes Sister    Diabetes Sister    Diabetes Sister     Prior to Admission medications   Medication Sig Start Date End Date Taking? Authorizing Provider  Accu-Chek FastClix Lancets MISC Use to check blood sugar five times daily. 04/21/21   [name], MD  Acetaminophen-Codeine (TYLENOL/CODEINE #3) 300-30 MG tablet Take 1 tablet by mouth every 6 (six) hours as needed for pain. 08/03/21   Horald Pollen, MD  amitriptyline (ELAVIL) 100 MG tablet Take 1 tablet (100 mg total) by mouth at bedtime. 07/23/21   Arfeen,  Arlyce Harman, MD  aspirin 325 MG tablet Take 1 tablet (325 mg total) by mouth daily. 11/06/13   Samella Parr, NP  atorvastatin (LIPITOR) 40 MG tablet Take 1 tablet (40 mg total) by mouth daily. 04/08/21   [name], MD  blood glucose meter kit and supplies KIT Use to test blood sugars 04/27/19   [name], MD  buPROPion (WELLBUTRIN XL) 300 MG 24 hr tablet Take 1 tablet (300 mg total) by mouth daily. 07/23/21   Arfeen, Arlyce Harman, MD  Continuous Blood Gluc Receiver (FREESTYLE LIBRE 2 READER) DEVI 1 Act by Does not apply route daily. Patient not taking: Reported on 08/04/2021 11/27/20   [name], MD  Continuous Blood Gluc Sensor (FREESTYLE LIBRE 2 SENSOR) MISC 1 Act by Does not apply route daily. Patient not taking: Reported on 08/04/2021  11/27/20   [name], MD  Cyanocobalamin (VITAMIN B-12 CR) 1500 MCG TBCR Take 1 tablet (1,500 mcg total) by mouth daily. Patient not taking: Reported on 08/04/2021 09/26/18   [name], MD  dapagliflozin propanediol (FARXIGA) 10 MG TABS tablet Take 1 tablet (10 mg total) by mouth daily before breakfast. 04/08/21   [name], MD  dexlansoprazole (DEXILANT) 60 MG capsule Take 1 capsule by mouth daily. 05/26/21   [name], MD  EDARBYCLOR 40-12.5 MG TABS Take 1 tablet by mouth daily. 06/25/21   [name], MD  gabapentin (NEURONTIN) 100 MG capsule Take 1 capsule by mouth twice daily.  11/28/19   [name], MD  glucose blood (ACCU-CHEK GUIDE) test strip Use to check blood sugars five times daily. 04/21/21   [name], MD  hydrALAZINE (APRESOLINE) 25 MG tablet Take 1 tablet by mouth in the morning and at bedtime. 09/27/20   [name], MD  insulin lispro (HUMALOG KWIKPEN) 200 UNIT/ML KwikPen Inject 10 Units into the skin 3 (three) times daily with meals as needed. Patient not taking: Reported on 08/04/2021 04/02/20   [name], MD  Insulin Pen Needle 32G X 4 MM MISC Use to administer insulin four times a day 04/02/20   [name], MD  levothyroxine (SYNTHROID) 112 MCG tablet Take 1 tablet by mouth daily before breakfast. 06/25/21   [name], MD  loratadine (CLARITIN) 10 MG tablet Take 1 tablet (10 mg total) by mouth daily. 04/21/21   [name], MD  Semaglutide, 2 MG/DOSE, 8 MG/3ML SOPN Inject 2 mg as directed once a week. 04/08/21   [name], MD  TOUJEO SOLOSTAR 300 UNIT/ML Solostar Pen Inject 50 units under the skin every morning. 06/21/21   [name], MD    Physical Exam: Vitals:   08/14/21 0900 08/14/21 1045 08/14/21 1100 08/14/21 1400  BP: (!) 157/85 (!) 148/66 (!) 151/54 (!) 145/44  Pulse: 89 85 78 86  Resp: $Remo'15 16 16 15  'ECsmQ$ Temp:      TempSrc:      SpO2: 100% 100% 100% 100%  Weight:      Height:       General:  Appears  calm and comfortable and is in NAD Eyes:  PERRL, EOMI, normal lids, iris ENT:  grossly normal hearing, lips & tongue, mmm Neck:  no LAD, masses or thyromegaly Cardiovascular:  RRR, no m/r/g. No LE edema.  Respiratory:   CTA bilaterally with no wheezes/rales/rhonchi.  Normal respiratory effort. Abdomen:  soft, NT, ND Skin:  no rash or induration seen on limited exam  Musculoskeletal:  grossly normal tone BUE/BLE, good ROM, no bony abnormality Lower extremity:  No LE edema.  Limited foot exam with no ulcerations.  2+ distal pulses. Psychiatric:  blunted mood and affect, speech fluent and appropriate, AOx3 Neurologic:  CN 2-12 grossly intact, moves all extremities in coordinated fashion, sensation decreased diffusely on entire left face   Radiological Exams on Admission: Independently reviewed - see discussion in A/P where applicable  MR ANGIO HEAD WO CONTRAST  Result Date: 08/14/2021 CLINICAL DATA:  Stroke. EXAM: MRA HEAD WITHOUT CONTRAST TECHNIQUE: Angiographic images of the Circle of Willis were acquired using MRA technique without intravenous contrast. COMPARISON:  Brain MRI from earlier today.  CTA 11/03/2013 FINDINGS: Anterior circulation: Atheromatous irregularity of the carotid siphons with over 50% stenosis the bilateral cavernous segment. No downstream branch occlusion, beading, or flow limiting stenosis. Negative for aneurysm. Posterior circulation: Vertebral and basilar arteries are smoothly contoured and widely patent. No branch occlusion, beading, or aneurysm Anatomic variants: None significant IMPRESSION: No emergent finding. Atheromatous irregularity of the bilateral cavernous carotids with at least 50% stenosis, appearance stable from 2015. Electronically Signed   By: Jorje Guild M.D.   On: 08/14/2021 10:36   MR BRAIN WO CONTRAST  Result Date: 08/14/2021 CLINICAL DATA:  Acute neuro deficit with stroke suspected. Decreased sensation on the left side. EXAM: MRI HEAD WITHOUT  CONTRAST TECHNIQUE: Multiplanar, multiecho pulse sequences of the brain and surrounding structures were obtained without intravenous contrast. COMPARISON:  04/09/2020 FINDINGS: Brain: No acute infarction, hemorrhage, hydrocephalus, extra-axial collection or mass lesion. Small to moderate remote cortically based infarct at the left frontal parietal junction. Age normal brain volume. No hemorrhage, hydrocephalus, or masslike finding Vascular: Normal flow voids Skull and upper cervical spine: Normal marrow signal. C3-4 disc space narrowing and ridging. Sinuses/Orbits: Negative IMPRESSION: 1. No acute or reversible finding. 2. Remote left frontoparietal cortex infarct. Electronically Signed   By: Jorje Guild M.D.   On: 08/14/2021 04:58   ECHOCARDIOGRAM COMPLETE  Result Date: 08/14/2021    ECHOCARDIOGRAM REPORT   Patient Name:   Sonia Skinner Date of Exam: 08/14/2021 Medical Rec #:  [id number]   Height:       62.0 in Accession #:    [id number]  Weight:       126.0 lb Date of Birth:  Oct 05, 1950   BSA:          1.571 m Patient Age:    38 years    BP:           151/54 mmHg Patient Gender: F           HR:           83 bpm. Exam Location:  Inpatient Procedure: 2D Echo, Cardiac Doppler and Color Doppler Indications:    Stroke  History:        Patient has prior history of Echocardiogram examinations, most                 recent 12/13/2016. Risk Factors:Hypertension and Diabetes.  Sonographer:    Glo Herring Referring Phys: [id number] [place]  1. Left ventricular ejection fraction, by estimation, is 70 to 75%. The left ventricle has hyperdynamic function. The left ventricle has no regional wall motion abnormalities. Left ventricular diastolic parameters are consistent with Grade I diastolic dysfunction (impaired relaxation).  2. Right ventricular systolic function is normal. The right ventricular size is normal. Tricuspid regurgitation signal is inadequate for assessing PA pressure.  3. The mitral valve is  degenerative. Trivial mitral valve regurgitation. No evidence of mitral stenosis.  4. The aortic valve is tricuspid. Aortic valve regurgitation is not visualized. No aortic stenosis is present.  5. The inferior vena cava is normal in size with greater than 50% respiratory variability, suggesting right atrial pressure of 3 mmHg. Conclusion(s)/Recommendation(s): No intracardiac source of embolism detected on this transthoracic study. Consider a transesophageal echocardiogram to exclude cardiac source of embolism if clinically indicated. FINDINGS  Left Ventricle: Left ventricular ejection fraction, by estimation, is 70 to 75%. The left ventricle has hyperdynamic function. The left ventricle has no regional wall motion abnormalities. The left ventricular internal cavity size was normal in size. There is no left ventricular hypertrophy. Left ventricular diastolic parameters are consistent with Grade I diastolic dysfunction (impaired relaxation). Right Ventricle: The right ventricular size is normal. No increase in right ventricular wall thickness. Right ventricular systolic function is normal. Tricuspid regurgitation signal is inadequate for assessing PA pressure. Left Atrium: Left atrial size was normal in size. Right Atrium: Right atrial size was normal in size. Pericardium: There is no evidence of pericardial effusion. Mitral Valve: The mitral valve is degenerative in appearance. There is mild calcification of the anterior and posterior mitral valve leaflet(s). Trivial mitral valve regurgitation. No evidence of mitral valve stenosis. MV peak gradient, 8.6 mmHg. The mean mitral valve gradient is 3.0 mmHg. Tricuspid Valve: The tricuspid valve is grossly normal. Tricuspid valve regurgitation is not demonstrated. No evidence of tricuspid stenosis. Aortic Valve: The aortic valve is tricuspid. Aortic valve regurgitation is not visualized. No aortic stenosis is present. Aortic valve mean gradient measures 4.0 mmHg. Aortic  valve peak gradient measures 7.7 mmHg. Pulmonic Valve: The pulmonic valve was grossly normal. Pulmonic valve regurgitation is not visualized. No evidence of pulmonic stenosis. Aorta: The aortic root and ascending aorta are structurally normal, with no evidence of dilitation. Venous: The inferior vena cava is normal in size with greater than 50% respiratory variability, suggesting right atrial pressure of 3 mmHg. IAS/Shunts: The atrial septum is grossly normal.  LEFT VENTRICLE PLAX 2D LVIDd:         3.30 cm Diastology LVIDs:         2.20 cm LV e' medial:    6.85 cm/s LV PW:         1.00 cm LV E/e' medial:  15.0 LV IVS:        0.90 cm LV e' lateral:   9.57 cm/s                        LV E/e' lateral: 10.8  RIGHT VENTRICLE             IVC RV S prime:     10.30 cm/s  IVC diam: 1.60 cm LEFT ATRIUM           Index LA diam:      3.70 cm 2.36 cm/m LA Vol (A4C): 33.2 ml 21.13 ml/m  AORTIC VALVE                   PULMONIC VALVE AV Vmax:           138.50 cm/s PV Vmax:       1.06 m/s AV Vmean:          96.900 cm/s PV Peak grad:  4.5 mmHg AV VTI:            0.258 m AV Peak Grad:      7.7 mmHg AV Mean  Grad:      4.0 mmHg LVOT Vmax:         133.00 cm/s LVOT Vmean:        94.400 cm/s LVOT VTI:          0.242 m LVOT/AV VTI ratio: 0.94  AORTA Ao Root diam: 2.50 cm Ao Asc diam:  2.10 cm MITRAL VALVE MV Area (PHT): 2.84 cm     SHUNTS MV Peak grad:  8.6 mmHg     Systemic VTI: 0.24 m MV Mean grad:  3.0 mmHg MV Vmax:       1.47 m/s MV Vmean:      80.2 cm/s MV Decel Time: 267 msec MV E velocity: 103.00 cm/s MV A velocity: 140.00 cm/s MV E/A ratio:  0.74 Eleonore Chiquito MD Electronically signed by Eleonore Chiquito MD Signature Date/Time: 08/14/2021/1:49:52 PM    Final    CT HEAD CODE STROKE WO CONTRAST  Result Date: 08/13/2021 CLINICAL DATA:  Code stroke. Acute neuro deficit. Left-sided weakness. EXAM: CT HEAD WITHOUT CONTRAST TECHNIQUE: Contiguous axial images were obtained from the base of the skull through the vertex without  intravenous contrast. COMPARISON:  CT head 04/09/2020 FINDINGS: Brain: Mild cerebral volume loss, typical for age. Ventricle size normal. Chronic infarct left high parietal lobe unchanged. Negative for acute infarct, acute hemorrhage, or mass lesion. Vascular: Negative for hyperdense vessel Skull: Negative Sinuses/Orbits: Mild mucosal edema paranasal sinuses. Bilateral cataract extraction Other: None ASPECTS ([place] Stroke Program Early CT Score) - Ganglionic level infarction (caudate, lentiform nuclei, internal capsule, insula, M1-M3 cortex): 7 - Supraganglionic infarction (M4-M6 cortex): 3 Total score (0-10 with 10 being normal): 10 IMPRESSION: 1. No acute abnormality 2. ASPECTS is 10 3. Code stroke imaging results were communicated on 08/13/2021 at 8:01 pm to provider [place] via text page Electronically Signed   By: Franchot Gallo M.D.   On: 08/13/2021 20:09    EKG: Independently reviewed.   1922 - Sinus tachycardia with rate 105; nonspecific ST changes with no evidence of acute ischemia 2022 - NSR with rate 97; nonspecific ST changes with no evidence of acute ischemia   Labs on Admission: I have personally reviewed the available labs and imaging studies at the time of the admission.  Pertinent labs:    Glucose 214, 37, 274, 265 BUN 18/Creatinine 1.83/GFR 29; 14/1.35/39 on 8/23 WBC 12.2 Hgb 11.1 COVID/flu negative ETOH <10 Lipids on 04/07/21: 239/56/149/169 A1c on 8/23 was 7.2   Assessment/Plan * TIA (transient ischemic attack)- (present on admission) -Acute onset of vague severe fatigue and L facial droop with mild drooling -She has had prior CVA -Concerning for TIA/CVA -MRI negative -Aspirin has been given to reduce stroke mortality and decrease morbidity -Will place in observation status for TIA evaluation -Telemetry monitoring -MRA ordered by neurology -Will order carotid dopplers -Echo -Neurology consult -PT/OT/ST/Nutrition Consults  Stage 3b chronic kidney disease ([place])-  (present on admission) -Creatinine is worse than prior, but she was started on Farxiga in the interim and it has not been apparently rechecked since -Will trend without intervention for now -Holding ARB and Farxiga  Hyperlipidemia with target LDL less than 70- (present on admission) -LDL was quite high in August but it appears she wasn't taking her statin at that time, will recheck -Continue Lipitor 40 mg daily for now  Hypothyroidism- (present on admission) -TSH was markedly abnormal at last check -Will recheck -Continue Synthroid at current dose for now  Type II diabetes mellitus with manifestations ([place])- (present on admission) -Recent A1c was  slightly above goal -She had an apparent episode of hypoglycemia in the ER but it appears to have been an isolated episode in the midst of glucose in the 200s -For now will cut insulin to 50% and cover with moderate-scale SSI -Hold semaglutide  Essential hypertension, benign- (present on admission) -Permissive HTN for now -Will treat if SBP >220/120 to decrease by 10-15% -Hold home ARB, thiazide diuretic    Advance Care Planning:   Code Status: Full Code   Consults: Neurology; PT/OT/ST/Nutrition/TOC team  Family Communication: None present; she declined to have me call family at the time of admission  Severity of Illness: The appropriate patient status for this patient is OBSERVATION. Observation status is judged to be reasonable and necessary in order to provide the required intensity of service to ensure the patient's safety. The patient's presenting symptoms, physical exam findings, and initial radiographic and laboratory data in the context of their medical condition is felt to place them at decreased risk for further clinical deterioration. Furthermore, it is anticipated that the patient will be medically stable for discharge from the hospital within 2 midnights of admission.   Author: Karmen Bongo 08/14/2021 4:06 PM  For on  call review [email or web address].

## 2021-08-14 NOTE — Progress Notes (Signed)
SLP Cancellation Note  Patient Details Name: Sonia Skinner MRN: [id number] DOB: 09/14/1950   Cancelled treatment:       Reason Eval/Treat Not Completed: Patient at procedure or test/unavailable   Elvina Sidle, M.S., CCC-SLP 08/14/2021, 12:17 PM

## 2021-08-14 NOTE — Assessment & Plan Note (Signed)
-  Creatinine is worse than prior, but she was started on Farxiga in the interim and it has not been apparently rechecked since -Will trend without intervention for now -Holding ARB and [place]

## 2021-08-14 NOTE — Assessment & Plan Note (Signed)
-  Recent A1c was slightly above goal -She had an apparent episode of hypoglycemia in the ER but it appears to have been an isolated episode in the midst of glucose in the 200s -For now will cut insulin to 50% and cover with moderate-scale SSI -Hold semaglutide

## 2021-08-14 NOTE — Assessment & Plan Note (Addendum)
-  Acute onset of vague severe fatigue and L facial droop with mild drooling -She has had prior CVA -Concerning for TIA/CVA -MRI negative -Aspirin has been given to reduce stroke mortality and decrease morbidity -Will place in observation status for TIA evaluation -Telemetry monitoring -MRA ordered by neurology -Will order carotid dopplers -Echo -Neurology consult -PT/OT/ST/Nutrition Consults

## 2021-08-15 ENCOUNTER — Observation Stay (HOSPITAL_BASED_OUTPATIENT_CLINIC_OR_DEPARTMENT_OTHER): Payer: Medicare Other

## 2021-08-15 DIAGNOSIS — G459 Transient cerebral ischemic attack, unspecified: Secondary | ICD-10-CM

## 2021-08-15 LAB — URINALYSIS, ROUTINE W REFLEX MICROSCOPIC
Bilirubin Urine: NEGATIVE
Glucose, UA: >=500 mg/dL — AB
Hgb urine dipstick: NEGATIVE
Ketones, ur: NEGATIVE mg/dL
Leukocytes,Ua: NEGATIVE
Nitrite: NEGATIVE
Protein, ur: NEGATIVE mg/dL
Specific Gravity, Urine: 1.008 (ref 1.005–1.030)
pH: 5 (ref 5.0–8.0)

## 2021-08-15 LAB — GLUCOSE, CAPILLARY
Glucose-Capillary: 54 mg/dL — ABNORMAL LOW (ref 70–99)
Glucose-Capillary: 198 mg/dL — ABNORMAL HIGH (ref 70–99)
Glucose-Capillary: 61 mg/dL — ABNORMAL LOW (ref 70–99)
Glucose-Capillary: 154 mg/dL — ABNORMAL HIGH (ref 70–99)

## 2021-08-15 LAB — RAPID URINE DRUG SCREEN, HOSP PERFORMED
Amphetamines: NOT DETECTED
Barbiturates: NOT DETECTED
Benzodiazepines: NOT DETECTED
Cocaine: NOT DETECTED
Opiates: NOT DETECTED
Tetrahydrocannabinol: NOT DETECTED

## 2021-08-15 LAB — LIPID PANEL
Cholesterol: 196 mg/dL (ref 0–200)
HDL: 40 mg/dL — ABNORMAL LOW (ref >40)
LDL Cholesterol: 109 mg/dL — ABNORMAL HIGH (ref 0–99)
Total CHOL/HDL Ratio: 4.9 RATIO
Triglycerides: 234 mg/dL — ABNORMAL HIGH (ref <150)
VLDL: 47 mg/dL — ABNORMAL HIGH (ref 0–40)

## 2021-08-15 LAB — HEMOGLOBIN A1C
Hgb A1c MFr Bld: 8.5 % — ABNORMAL HIGH (ref 4.8–5.6)
Mean Plasma Glucose: 197.25 mg/dL

## 2021-08-15 LAB — TSH: TSH: 0.053 u[IU]/mL — ABNORMAL LOW (ref 0.350–4.500)

## 2021-08-15 MED ORDER — ASPIRIN EC 81 MG PO TBEC: 81.0000 mg | DELAYED_RELEASE_TABLET | Freq: Every day | ORAL | 0 refills | Status: DC

## 2021-08-15 MED ORDER — ASPIRIN EC 325 MG PO TBEC: 325.0000 mg | DELAYED_RELEASE_TABLET | Freq: Every day | ORAL | 3 refills | Status: DC

## 2021-08-15 MED ORDER — CLOPIDOGREL BISULFATE 75 MG PO TABS: 75.0000 mg | ORAL_TABLET | Freq: Every day | ORAL | 0 refills | Status: AC

## 2021-08-15 NOTE — Discharge Summary (Signed)
Sonia Skinner YFV:[id number] DOB: 12-04-1950 DOA: 08/13/2021  PCP: [name], MD  Admit date: 08/13/2021  Discharge date: 08/15/2021  Admitted From: Home   Disposition:  Home    Recommendations for Outpatient Follow-up:   Follow up with PCP in 1-2 weeks Follow-up with neurologist as planned.  Home Health: N/A Equipment/Devices: N/A Consultations: Neurology Discharge Condition: Improved CODE STATUS: Full code Diet Recommendation: Heart Healthy carb modified  Diet Order             Diet - low sodium heart healthy           Diet Carb Modified           Diet heart healthy/carb modified Room service appropriate? Yes; Fluid consistency: Thin  Diet effective ____                    Chief Complaint  Patient presents with   Numbness   Code Stroke     Brief history of present illness from the day of admission and additional interim summary    Sonia Skinner is an 70 y.o. female with a PMHx of stroke in 2015 with residual left sided weakness, anxiety, left carotid artery occlusion, prior left CEA, DM, HTN, prior cervical spine anterior fixation with posterior microdiscectomy and thyroid disease who presents to the ED with a c/c of acute onset of sensory "dullness" on her entire left side. LKN was at 1700, which is when symptoms started. She denies new weakness. She does endorse old left sided facial weakness from her prior stroke, but denies chronic left upper or lower extremity weakness. However, as she was examined and her mild left upper extremity weakness was evident, she then did endorse some mild left upper extremity weakness as a chronic sequela of her prior stroke.                                                                     Hospital Course   Sonia Skinner is a 70 y.o. female with history  of stroke in 2015 with residual left sided weakness, anxiety, left carotid artery occlusion, prior left CEA, DM, HTN, prior cervical spine anterior fixation with posterior microdiscectomy and thyroid disease presenting with left side numbness and weakness.  She does endorse the symptoms from her previous stroke however states that they are worsened.  The drooling sensation she previously had has seemed to resolved.  CT had showed no acute abnormality and the MRI showed her previous stroke.  Carotid Dopplers revealed: Right Carotid: Velocities in the right ICA are consistent with a 1-39% stenosis. Left Carotid: Velocities in the left ICA are consistent with a 60-79%  , 2D echo showed EF of 70 to 75% with left ventricle hyperdynamic function. Patient was  treated conservatively and did well.  On day of discharge patient states she has much less numbness and tingling and drooping of her face.  Patient is discharged home on DAPT ASA 81 mg and Plavix 75 mg x 21 days and then resumption of aspirin 325 mg daily as she is on at present.  TIA vs. anxiety Code Stroke CT - No acute abnormality. ASPECTS 10.    MRI  remote left frontoparietal cortex infarct MRA  Atheromatous irregularity of the bilateral cavernous carotids with at least 50% stenosis, appearance stable from 2015. Carotid Doppler  right ICA are consistent with a 1-39% stenosis. Left Carotid: Velocities in the left ICA are consistent with a 60-79% stenosis.  Symptomatic left ICA stenosis with h/o CEA on same side. Consult vascular.  2D Echo EF 70-75% LV hyperdynamic function LDL 109 in 03/2021 149. High dose statin on discharge. HgbA1c 8.5 this admission in 03/2021 7.2. Need more aggressive mgt outpt. VTE prophylaxis - Lovenox 12m aspirin 325 mg daily prior to admission, now on aspirin 325 mg daily.  DAPT with aspirin 826mand plavix 7510maily x 3 week then aspirin 325m33merapy recommendations:  none.  OT no follow up requested Disposition:   pending   Hypertension Home meds:  hydralazine 25mg24mble Long-term BP goal normotensive   Hyperlipidemia Home meds:  Atorvastatin 40mg,71mumed in hospital LDL 109 goal < 70 Continue statin at discharge. Increase to atorvastatin 80mg h38mdose.   Diabetes type II Uncontrolled Home meds:  Toujeo solostar, Farxiga 10 mg HgbA1c 8.5, goal < 7.0 CBGs Further management per her outpatient PCP.   Other Stroke Risk Factors Advanced Age >/= 65  Hx 62roke/TIA MCA scattered stroke in 2015 Previous episode of left side weakness after stroke and diminished sensation on the left side Coronary artery disease Previous CEA for ICA stenosis after MCA stroke on 11/04/2013    Discharge diagnosis     Principal Problem:   TIA (transient ischemic attack) Active Problems:   Essential hypertension, benign   Type II diabetes mellitus with manifestations (HCC)   Hypothyroidism   Hyperlipidemia with target LDL less than 70   Stage 3b chronic kidney disease (HCC)  [facility]    [facility] Inst[facility]     Ambulatory referral to Neurology   Complete by: As directed    Follow up with Dr. Aquino Delice Lesch in [facility] weeks.  Patient is Dr. Aquino'Amparo Bristolt. Thanks.   Diet - low sodium heart healthy   Complete by: As directed    Diet Carb Modified   Complete by: As directed    Discharge instructions   Complete by: As directed    1.  Right now you are on aspirin 325 mg daily.  Stop taking this for now. 2.  Start taking aspirin 81 mg (baby aspirin) as well as Plavix 75 mg every day for the next 21 days. 3.  After 21 days, you can go back to taking aspirin 325 mg daily. 4.  Follow-up with the neurologist as scheduled by them. 5.  Follow-up with your PCP in 1 to 2 weeks to make sure you are still doing well.       Discharge Medications   Allergies as of 08/15/2021       Reactions   Anesthetics, Ester Anaphylaxis        Medication List     STOP taking these medications     aspirin 325 MG tablet Replaced by: aspirin EC 81 MG tablet  TAKE these medications    Accu-Chek FastClix Lancets Misc Use to check blood sugar five times daily.   Accu-Chek Guide test strip Generic drug: glucose blood Use to check blood sugars five times daily.   acetaminophen 500 MG tablet Commonly known as: TYLENOL Take 1,000-1,500 mg by mouth every 6 (six) hours as needed for mild pain.   Acetaminophen-Codeine 300-30 MG tablet Commonly known as: TYLENOL/CODEINE #3 Take 1 tablet by mouth every 6 (six) hours as needed for pain.   amitriptyline 100 MG tablet Commonly known as: ELAVIL Take 1 tablet (100 mg total) by mouth at bedtime.   aspirin EC 81 MG tablet Take 1 tablet (81 mg total) by mouth daily for 21 days. Swallow whole. Replaces: aspirin 325 MG tablet   aspirin EC 325 MG tablet Take 1 tablet (325 mg total) by mouth daily. Start taking on: September 05, 2021   atorvastatin 40 MG tablet Commonly known as: LIPITOR Take 1 tablet (40 mg total) by mouth daily. What changed: when to take this   blood glucose meter kit and supplies Kit Use to test blood sugars   buPROPion 300 MG 24 hr tablet Commonly known as: WELLBUTRIN XL Take 1 tablet (300 mg total) by mouth daily.   clopidogrel 75 MG tablet Commonly known as: Plavix Take 1 tablet (75 mg total) by mouth daily for 21 days.   dapagliflozin propanediol 10 MG Tabs tablet Commonly known as: Farxiga Take 1 tablet (10 mg total) by mouth daily before breakfast.   dexlansoprazole 60 MG capsule Commonly known as: DEXILANT Take 1 capsule by mouth daily.   Edarbyclor 40-12.5 MG Tabs Generic drug: Azilsartan-Chlorthalidone Take 1 tablet by mouth daily.   FreeStyle Libre 2 Reader [place] 1 Act by Does not apply route daily.   FreeStyle Libre 2 Sensor Misc 1 Act by Does not apply route daily.   gabapentin 100 MG capsule Commonly known as: NEURONTIN Take 1 capsule by mouth twice daily.   hydrALAZINE 25  MG tablet Commonly known as: APRESOLINE Take 1 tablet by mouth in the morning and at bedtime.   Insulin Pen Needle 32G X 4 MM Misc Use to administer insulin four times a day   levothyroxine 112 MCG tablet Commonly known as: SYNTHROID Take 1 tablet by mouth daily before breakfast. What changed: when to take this   loratadine 10 MG tablet Commonly known as: CLARITIN Take 1 tablet (10 mg total) by mouth daily.   Semaglutide (2 MG/DOSE) 8 MG/3ML Sopn Inject 2 mg as directed once a week.   Ozempic (1 MG/DOSE) 4 MG/3ML Sopn Generic drug: Semaglutide (1 MG/DOSE) Inject 4 mg into the skin once a week.   Toujeo SoloStar 300 UNIT/ML Solostar Pen Generic drug: insulin glargine (1 Unit Dial) Inject 50 units under the skin every morning. What changed: See the new instructions.   VITAMIN D3 PO Take 1 tablet by mouth daily.         Follow-up Information     Sherren Mocha, MD .   Specialty: Cardiology Contact information: [address] [place] [address] [phone]         Cameron Sprang, MD. Schedule an appointment as soon as possible for a visit in 1 month(s).   Specialty: Neurology Contact information: [place] [address] [phone]                 Major procedures and Radiology Reports - PLEASE review detailed and final reports  thoroughly  -      MR ANGIO HEAD WO CONTRAST  Result Date: 08/14/2021 CLINICAL DATA:  Stroke. EXAM: MRA HEAD WITHOUT CONTRAST TECHNIQUE: Angiographic images of the Circle of Willis were acquired using MRA technique without intravenous contrast. COMPARISON:  Brain MRI from earlier today.  CTA 11/03/2013 FINDINGS: Anterior circulation: Atheromatous irregularity of the carotid siphons with over 50% stenosis the bilateral cavernous segment. No downstream branch occlusion, beading, or flow limiting stenosis. Negative for aneurysm. Posterior circulation: Vertebral and basilar arteries  are smoothly contoured and widely patent. No branch occlusion, beading, or aneurysm Anatomic variants: None significant IMPRESSION: No emergent finding. Atheromatous irregularity of the bilateral cavernous carotids with at least 50% stenosis, appearance stable from 2015. Electronically Signed   By: Jorje Guild M.D.   On: 08/14/2021 10:36   MR BRAIN WO CONTRAST  Result Date: 08/14/2021 CLINICAL DATA:  Acute neuro deficit with stroke suspected. Decreased sensation on the left side. EXAM: MRI HEAD WITHOUT CONTRAST TECHNIQUE: Multiplanar, multiecho pulse sequences of the brain and surrounding structures were obtained without intravenous contrast. COMPARISON:  04/09/2020 FINDINGS: Brain: No acute infarction, hemorrhage, hydrocephalus, extra-axial collection or mass lesion. Small to moderate remote cortically based infarct at the left frontal parietal junction. Age normal brain volume. No hemorrhage, hydrocephalus, or masslike finding Vascular: Normal flow voids Skull and upper cervical spine: Normal marrow signal. C3-4 disc space narrowing and ridging. Sinuses/Orbits: Negative IMPRESSION: 1. No acute or reversible finding. 2. Remote left frontoparietal cortex infarct. Electronically Signed   By: Jorje Guild M.D.   On: 08/14/2021 04:58   ECHOCARDIOGRAM COMPLETE  Result Date: 08/14/2021    ECHOCARDIOGRAM REPORT   Patient Name:   ARLIENE ROSENOW Date of Exam: 08/14/2021 Medical Rec #:  [id number]   Height:       62.0 in Accession #:    [id number]  Weight:       126.0 lb Date of Birth:  15-Mar-1951   BSA:          1.571 m Patient Age:    85 years    BP:           151/54 mmHg Patient Gender: F           HR:           83 bpm. Exam Location:  Inpatient Procedure: 2D Echo, Cardiac Doppler and Color Doppler Indications:    Stroke  History:        Patient has prior history of Echocardiogram examinations, most                 recent 12/13/2016. Risk Factors:Hypertension and Diabetes.  Sonographer:    Glo Herring  Referring Phys: [id number] [place]  1. Left ventricular ejection fraction, by estimation, is 70 to 75%. The left ventricle has hyperdynamic function. The left ventricle has no regional wall motion abnormalities. Left ventricular diastolic parameters are consistent with Grade I diastolic dysfunction (impaired relaxation).  2. Right ventricular systolic function is normal. The right ventricular size is normal. Tricuspid regurgitation signal is inadequate for assessing PA pressure.  3. The mitral valve is degenerative. Trivial mitral valve regurgitation. No evidence of mitral stenosis.  4. The aortic valve is tricuspid. Aortic valve regurgitation is not visualized. No aortic stenosis is present.  5. The inferior vena cava is normal in size with greater than 50% respiratory variability, suggesting right atrial pressure of 3 mmHg. Conclusion(s)/Recommendation(s): No intracardiac source of embolism detected on this transthoracic study. Consider a transesophageal echocardiogram  to exclude cardiac source of embolism if clinically indicated. FINDINGS  Left Ventricle: Left ventricular ejection fraction, by estimation, is 70 to 75%. The left ventricle has hyperdynamic function. The left ventricle has no regional wall motion abnormalities. The left ventricular internal cavity size was normal in size. There is no left ventricular hypertrophy. Left ventricular diastolic parameters are consistent with Grade I diastolic dysfunction (impaired relaxation). Right Ventricle: The right ventricular size is normal. No increase in right ventricular wall thickness. Right ventricular systolic function is normal. Tricuspid regurgitation signal is inadequate for assessing PA pressure. Left Atrium: Left atrial size was normal in size. Right Atrium: Right atrial size was normal in size. Pericardium: There is no evidence of pericardial effusion. Mitral Valve: The mitral valve is degenerative in appearance. There is mild calcification  of the anterior and posterior mitral valve leaflet(s). Trivial mitral valve regurgitation. No evidence of mitral valve stenosis. MV peak gradient, 8.6 mmHg. The mean mitral valve gradient is 3.0 mmHg. Tricuspid Valve: The tricuspid valve is grossly normal. Tricuspid valve regurgitation is not demonstrated. No evidence of tricuspid stenosis. Aortic Valve: The aortic valve is tricuspid. Aortic valve regurgitation is not visualized. No aortic stenosis is present. Aortic valve mean gradient measures 4.0 mmHg. Aortic valve peak gradient measures 7.7 mmHg. Pulmonic Valve: The pulmonic valve was grossly normal. Pulmonic valve regurgitation is not visualized. No evidence of pulmonic stenosis. Aorta: The aortic root and ascending aorta are structurally normal, with no evidence of dilitation. Venous: The inferior vena cava is normal in size with greater than 50% respiratory variability, suggesting right atrial pressure of 3 mmHg. IAS/Shunts: The atrial septum is grossly normal.  LEFT VENTRICLE PLAX 2D LVIDd:         3.30 cm Diastology LVIDs:         2.20 cm LV e' medial:    6.85 cm/s LV PW:         1.00 cm LV E/e' medial:  15.0 LV IVS:        0.90 cm LV e' lateral:   9.57 cm/s                        LV E/e' lateral: 10.8  RIGHT VENTRICLE             IVC RV S prime:     10.30 cm/s  IVC diam: 1.60 cm LEFT ATRIUM           Index LA diam:      3.70 cm 2.36 cm/m LA Vol (A4C): 33.2 ml 21.13 ml/m  AORTIC VALVE                   PULMONIC VALVE AV Vmax:           138.50 cm/s PV Vmax:       1.06 m/s AV Vmean:          96.900 cm/s PV Peak grad:  4.5 mmHg AV VTI:            0.258 m AV Peak Grad:      7.7 mmHg AV Mean Grad:      4.0 mmHg LVOT Vmax:         133.00 cm/s LVOT Vmean:        94.400 cm/s LVOT VTI:          0.242 m LVOT/AV VTI ratio: 0.94  AORTA Ao Root diam: 2.50 cm Ao Asc diam:  2.10 cm MITRAL VALVE MV Area (PHT):  2.84 cm     SHUNTS MV Peak grad:  8.6 mmHg     Systemic VTI: 0.24 m MV Mean grad:  3.0 mmHg MV Vmax:       1.47  m/s MV Vmean:      80.2 cm/s MV Decel Time: 267 msec MV E velocity: 103.00 cm/s MV A velocity: 140.00 cm/s MV E/A ratio:  0.74 Sonia Chiquito MD Electronically signed by Sonia Chiquito MD Signature Date/Time: 08/14/2021/1:49:52 PM    Final    CT HEAD CODE STROKE WO CONTRAST  Result Date: 08/13/2021 CLINICAL DATA:  Code stroke. Acute neuro deficit. Left-sided weakness. EXAM: CT HEAD WITHOUT CONTRAST TECHNIQUE: Contiguous axial images were obtained from the base of the skull through the vertex without intravenous contrast. COMPARISON:  CT head 04/09/2020 FINDINGS: Brain: Mild cerebral volume loss, typical for age. Ventricle size normal. Chronic infarct left high parietal lobe unchanged. Negative for acute infarct, acute hemorrhage, or mass lesion. Vascular: Negative for hyperdense vessel Skull: Negative Sinuses/Orbits: Mild mucosal edema paranasal sinuses. Bilateral cataract extraction Other: None ASPECTS (Hoffman Stroke Program Early CT Score) - Ganglionic level infarction (caudate, lentiform nuclei, internal capsule, insula, M1-M3 cortex): 7 - Supraganglionic infarction (M4-M6 cortex): 3 Total score (0-10 with 10 being normal): 10 IMPRESSION: 1. No acute abnormality 2. ASPECTS is 10 3. Code stroke imaging results were communicated on 08/13/2021 at 8:01 pm to provider [place] via text page Electronically Signed   By: Franchot Gallo M.D.   On: 08/13/2021 20:09   VAS US CAROTID  Result Date: 08/15/2021 Carotid Arterial Duplex Study Patient Name:  Sonia Skinner  Date of Exam:   08/15/2021 Medical Rec #: [id number]    Accession #:    [id number] Date of Birth: Nov 28, 1950    Patient Gender: F Patient Age:   3 years Exam Location:  [facility] Procedure:      VAS US CAROTID Referring Phys: Rosalin Hawking --------------------------------------------------------------------------------  Indications:       TIA. Risk Factors:      Hypertension, Diabetes, prior CVA. Comparison Study:  03/14/18 prior Performing  Technologist: Archie Patten RVS  Examination Guidelines: A complete evaluation includes B-mode imaging, spectral Doppler, color Doppler, and power Doppler as needed of all accessible portions of each vessel. Bilateral testing is considered an integral part of a complete examination. Limited examinations for reoccurring indications may be performed as noted.  Right Carotid Findings: +----------+--------+--------+--------+-------------------------+--------+             PSV cm/s EDV cm/s Stenosis Plaque Description        Comments  +----------+--------+--------+--------+-------------------------+--------+  CCA Prox   107      13                heterogenous                        +----------+--------+--------+--------+-------------------------+--------+  CCA Distal 66       12                heterogenous                        +----------+--------+--------+--------+-------------------------+--------+  ICA Prox   139      32       1-39%    heterogenous and calcific           +----------+--------+--------+--------+-------------------------+--------+  ICA Distal 107      31                                                    +----------+--------+--------+--------+-------------------------+--------+  ECA        232                                                            +----------+--------+--------+--------+-------------------------+--------+ +----------+--------+-------+--------+-------------------+             PSV cm/s EDV cms Describe Arm Pressure (mmHG)  +----------+--------+-------+--------+-------------------+  Subclavian 148                                            +----------+--------+-------+--------+-------------------+ +---------+--------+---+--------+--+---------+  Vertebral PSV cm/s 104 EDV cm/s 14 Antegrade  +---------+--------+---+--------+--+---------+  Left Carotid Findings: +----------+--------+-------+--------+--------------------------------+--------+             PSV cm/s EDV     Stenosis Plaque  Description               Comments                       cm/s                                                        +----------+--------+-------+--------+--------------------------------+--------+  CCA Prox   111      15               heterogenous                               +----------+--------+-------+--------+--------------------------------+--------+  CCA Distal 57       11               heterogenous                               +----------+--------+-------+--------+--------------------------------+--------+  ICA Prox   339      94      60-79%   heterogenous, smooth and                                                         calcific                                   +----------+--------+-------+--------+--------------------------------+--------+  ICA Mid    181      40                                                          +----------+--------+-------+--------+--------------------------------+--------+  ICA Distal 131      43                                                          +----------+--------+-------+--------+--------------------------------+--------+  ECA        155                                                                  +----------+--------+-------+--------+--------------------------------+--------+ +----------+--------+--------+--------+-------------------+             PSV cm/s EDV cm/s Describe Arm Pressure (mmHG)  +----------+--------+--------+--------+-------------------+  Subclavian 119                                             +----------+--------+--------+--------+-------------------+ +---------+--------+--+--------+--+---------+  Vertebral PSV cm/s 58 EDV cm/s 14 Antegrade  +---------+--------+--+--------+--+---------+   Summary: Right Carotid: Velocities in the right ICA are consistent with a 1-39% stenosis. Left Carotid: Velocities in the left ICA are consistent with a 60-79% stenosis. Vertebrals: Bilateral vertebral arteries demonstrate antegrade flow. *See table(s) above  for measurements and observations.  Electronically signed by Monica Martinez MD on 08/15/2021 at 12:00:03 PM.    Final     Micro Results    Recent Results (from the past 240 hour(s))  Resp Panel by RT-PCR (Flu A&B, Covid) Nasopharyngeal Swab     Status: None   Collection Time: 08/13/21  7:43 PM   Specimen: Nasopharyngeal Swab; Nasopharyngeal(NP) swabs in vial transport medium  Result Value Ref Range Status   SARS Coronavirus 2 by RT PCR NEGATIVE NEGATIVE Final    Comment: (NOTE) SARS-CoV-2 target nucleic acids are NOT DETECTED.  The SARS-CoV-2 RNA is generally detectable in upper respiratory specimens during the acute phase of infection. The lowest concentration of SARS-CoV-2 viral copies this assay can detect is 138 copies/mL. A negative result does not preclude SARS-Cov-2 infection and should not be used as the sole basis for treatment or other patient management decisions. A negative result may occur with  improper specimen collection/handling, submission of specimen other than nasopharyngeal swab, presence of viral mutation(s) within the areas targeted by this assay, and inadequate number of viral copies(<138 copies/mL). A negative result must be combined with clinical observations, patient history, and epidemiological information. The expected result is Negative.  Fact Sheet for Patients:  EntrepreneurPulse.com.au  Fact Sheet for Healthcare Providers:  IncredibleEmployment.be  This test is no t yet approved or cleared by the [place] FDA and  has been authorized for detection and/or diagnosis of SARS-CoV-2 by FDA under an Emergency Use Authorization (EUA). This EUA will remain  in effect (meaning this test can be used) for the duration of the COVID-19 declaration under Section 564(b)(1) of the Act, 21 U.S.C.section 360bbb-3(b)(1), unless the authorization is terminated  or revoked sooner.       Influenza A by PCR NEGATIVE  NEGATIVE Final   Influenza B by PCR NEGATIVE NEGATIVE Final    Comment: (NOTE) The Xpert Xpress SARS-CoV-2/FLU/RSV plus assay is intended as an aid in the diagnosis of influenza from Nasopharyngeal swab specimens and should not be used as a sole basis for treatment. Nasal washings and aspirates are unacceptable for Xpert Xpress SARS-CoV-2/FLU/RSV testing.  Fact Sheet for Patients: EntrepreneurPulse.com.au  Fact Sheet for Healthcare Providers: IncredibleEmployment.be  This test is not yet approved or cleared by the [place] FDA and has been authorized for detection and/or diagnosis of  SARS-CoV-2 by FDA under an Emergency Use Authorization (EUA). This EUA will remain in effect (meaning this test can be used) for the duration of the COVID-19 declaration under Section 564(b)(1) of the Act, 21 U.S.C. section 360bbb-3(b)(1), unless the authorization is terminated or revoked.  Performed at [facility], [place] [address], [place], [place] [address]     Today   Subjective    Arnett Galindez feels much improved since admission.  Feels ready to go home.  Denies chest pain, shortness of breath or abdominal pain.  Feels they can take care of themselves with the resources they have at home.  Objective   Blood pressure (!) 132/56, pulse 84, temperature 98.9 F (37.2 C), temperature source Oral, resp. rate (!) 21, height _0  (1.575 m), weight 57.2 kg, SpO2 100 %.   Intake/Output Summary (Last 24 hours) at 08/15/2021 1412 Last data filed at 08/15/2021 0919 Gross per 24 hour  Intake 610 ml  Output 800 ml  Net -190 ml    Exam General: Patient appears well and in good spirits sitting up in bed in no acute distress.  Eyes: sclera anicteric, conjuctiva mild injection bilaterally CVS: S1-S2, regular  Respiratory:  decreased air entry bilaterally secondary to decreased inspiratory effort, rales at bases  GI: NABS, soft, NT  LE: No edema.  Neuro:  A/O x 3, Moving all extremities equally with normal strength, CN 3-12 intact, grossly nonfocal.  Psych: patient is logical and coherent, judgement and insight appear normal, mood and affect appropriate to situation.    Data Review   CBC w Diff:  Lab Results  Component Value Date   WBC 12.2 (H) 08/13/2021   HGB 12.2 08/13/2021   HCT 36.0 08/13/2021   PLT 242 08/13/2021   LYMPHOPCT 29 08/13/2021   MONOPCT 7 08/13/2021   EOSPCT 2 08/13/2021   BASOPCT 1 08/13/2021    CMP:  Lab Results  Component Value Date   NA 141 08/13/2021   K 3.9 08/13/2021   CL 106 08/13/2021   CO2 22 08/13/2021   BUN 22 08/13/2021   CREATININE 1.80 (H) 08/13/2021   CREATININE 1.22 (H) 04/01/2020   PROT 6.7 08/13/2021   ALBUMIN 3.7 08/13/2021   BILITOT 0.5 08/13/2021   ALKPHOS 96 08/13/2021   AST 13 (L) 08/13/2021   ALT 14 08/13/2021  .   Total Time in preparing paper work, data evaluation and todays exam - 35 minutes  Vashti Hey M.D on 08/15/2021 at 2:12 PM  [facility]

## 2021-08-15 NOTE — Progress Notes (Signed)
Hypoglycemic Event  CBG: 54 Treatment: 4 oz juice/soda Symptoms: Sweaty and Hungry  Follow-up CBG: Time: 61 CBG Result: 08:33 Treatment: breakfast tray arrived at 08:43 Symptoms: Hungry  Follow-up CBG: Time: 198 CBG Result: 09:18  Possible Reasons for Event: Inadequate meal intake   Sonia Skinner

## 2021-08-15 NOTE — Progress Notes (Signed)
IVT consulted for difficult PIV placement.  Pt states she was told she is possibly being discharged today.  Per pt request, RN to find out of pt is being discharged.  If PIV is necessary, RN will place new consult.

## 2021-08-15 NOTE — Progress Notes (Signed)
Carotid duplex has been completed.   Preliminary results in CV Proc.   Leida Luton Kourosh Jablonsky 08/15/2021 11:14 AM

## 2021-08-15 NOTE — Progress Notes (Addendum)
Pt w/Speech Therapy; come back after 1p for PIV assessment.

## 2021-08-15 NOTE — Progress Notes (Signed)
STROKE TEAM PROGRESS NOTE    INTERVAL HISTORY Doing well this morning. No complaints. Had U/S completed today.   Vitals:   08/15/21 0021 08/15/21 0400 08/15/21 0809 08/15/21 1206  BP: (!) 135/51 (!) 125/40 (!) 135/47 (!) 132/56  Pulse: 85 74 75 84  Resp: 14 14 16  (!) 21  Temp: 98.4 F (36.9 C) 98 F (36.7 C) 98.9 F (37.2 C) 98.9 F (37.2 C)  TempSrc: Axillary Oral Oral Oral  SpO2: 95% 98% 99% 100%  Weight:      Height:       CBC:  Recent Labs  Lab 08/13/21 1948 08/13/21 1955  WBC 12.2*  --   NEUTROABS 7.5  --   HGB 11.1* 12.2  HCT 35.4* 36.0  MCV 88.3  --   PLT 242  --    Basic Metabolic Panel:  Recent Labs  Lab 08/13/21 1948 08/13/21 1955  NA 138 141  K 3.8 3.9  CL 106 106  CO2 22  --   GLUCOSE 214* 210*  BUN 18 22  CREATININE 1.83* 1.80*  CALCIUM 9.4  --    Lipid Panel:  Recent Labs  Lab 08/15/21 0044  CHOL 196  TRIG 234*  HDL 40*  CHOLHDL 4.9  VLDL 47*  LDLCALC 109*   HgbA1c:  Recent Labs  Lab 08/15/21 0044  HGBA1C 8.5*   Urine Drug Screen:  Recent Labs  Lab 08/14/21 2013  LABOPIA NONE DETECTED  COCAINSCRNUR NONE DETECTED  LABBENZ NONE DETECTED  AMPHETMU NONE DETECTED  THCU NONE DETECTED  LABBARB NONE DETECTED    Alcohol Level  Recent Labs  Lab 08/13/21 1948  ETH <10    IMAGING past 24 hours ECHOCARDIOGRAM COMPLETE  Result Date: 08/14/2021    ECHOCARDIOGRAM REPORT   Patient Name:   Sonia Skinner Date of Exam: 08/14/2021 Medical Rec #:  [id number]   Height:       62.0 in Accession #:    [id number]  Weight:       126.0 lb Date of Birth:  [birth year]   BSA:          1.571 m Patient Age:    70 years    BP:           151/54 mmHg Patient Gender: F           HR:           83 bpm. Exam Location:  Inpatient Procedure: 2D Echo, Cardiac Doppler and Color Doppler Indications:    Stroke  History:        Patient has prior history of Echocardiogram examinations, most                 recent 12/13/2016. Risk Factors:Hypertension and Diabetes.   Sonographer:    Glo Herring Referring Phys: [id number] [place]  1. Left ventricular ejection fraction, by estimation, is 70 to 75%. The left ventricle has hyperdynamic function. The left ventricle has no regional wall motion abnormalities. Left ventricular diastolic parameters are consistent with Grade I diastolic dysfunction (impaired relaxation).  2. Right ventricular systolic function is normal. The right ventricular size is normal. Tricuspid regurgitation signal is inadequate for assessing PA pressure.  3. The mitral valve is degenerative. Trivial mitral valve regurgitation. No evidence of mitral stenosis.  4. The aortic valve is tricuspid. Aortic valve regurgitation is not visualized. No aortic stenosis is present.  5. The inferior vena cava is normal in size with greater than 50% respiratory variability, suggesting right  atrial pressure of 3 mmHg. Conclusion(s)/Recommendation(s): No intracardiac source of embolism detected on this transthoracic study. Consider a transesophageal echocardiogram to exclude cardiac source of embolism if clinically indicated. FINDINGS  Left Ventricle: Left ventricular ejection fraction, by estimation, is 70 to 75%. The left ventricle has hyperdynamic function. The left ventricle has no regional wall motion abnormalities. The left ventricular internal cavity size was normal in size. There is no left ventricular hypertrophy. Left ventricular diastolic parameters are consistent with Grade I diastolic dysfunction (impaired relaxation). Right Ventricle: The right ventricular size is normal. No increase in right ventricular wall thickness. Right ventricular systolic function is normal. Tricuspid regurgitation signal is inadequate for assessing PA pressure. Left Atrium: Left atrial size was normal in size. Right Atrium: Right atrial size was normal in size. Pericardium: There is no evidence of pericardial effusion. Mitral Valve: The mitral valve is degenerative in  appearance. There is mild calcification of the anterior and posterior mitral valve leaflet(s). Trivial mitral valve regurgitation. No evidence of mitral valve stenosis. MV peak gradient, 8.6 mmHg. The mean mitral valve gradient is 3.0 mmHg. Tricuspid Valve: The tricuspid valve is grossly normal. Tricuspid valve regurgitation is not demonstrated. No evidence of tricuspid stenosis. Aortic Valve: The aortic valve is tricuspid. Aortic valve regurgitation is not visualized. No aortic stenosis is present. Aortic valve mean gradient measures 4.0 mmHg. Aortic valve peak gradient measures 7.7 mmHg. Pulmonic Valve: The pulmonic valve was grossly normal. Pulmonic valve regurgitation is not visualized. No evidence of pulmonic stenosis. Aorta: The aortic root and ascending aorta are structurally normal, with no evidence of dilitation. Venous: The inferior vena cava is normal in size with greater than 50% respiratory variability, suggesting right atrial pressure of 3 mmHg. IAS/Shunts: The atrial septum is grossly normal.  LEFT VENTRICLE PLAX 2D LVIDd:         3.30 cm Diastology LVIDs:         2.20 cm LV e' medial:    6.85 cm/s LV PW:         1.00 cm LV E/e' medial:  15.0 LV IVS:        0.90 cm LV e' lateral:   9.57 cm/s                        LV E/e' lateral: 10.8  RIGHT VENTRICLE             IVC RV S prime:     10.30 cm/s  IVC diam: 1.60 cm LEFT ATRIUM           Index LA diam:      3.70 cm 2.36 cm/m LA Vol (A4C): 33.2 ml 21.13 ml/m  AORTIC VALVE                   PULMONIC VALVE AV Vmax:           138.50 cm/s PV Vmax:       1.06 m/s AV Vmean:          96.900 cm/s PV Peak grad:  4.5 mmHg AV VTI:            0.258 m AV Peak Grad:      7.7 mmHg AV Mean Grad:      4.0 mmHg LVOT Vmax:         133.00 cm/s LVOT Vmean:        94.400 cm/s LVOT VTI:          0.242 m LVOT/AV VTI  ratio: 0.94  AORTA Ao Root diam: 2.50 cm Ao Asc diam:  2.10 cm MITRAL VALVE MV Area (PHT): 2.84 cm     SHUNTS MV Peak grad:  8.6 mmHg     Systemic VTI: 0.24 m MV  Mean grad:  3.0 mmHg MV Vmax:       1.47 m/s MV Vmean:      80.2 cm/s MV Decel Time: 267 msec MV E velocity: 103.00 cm/s MV A velocity: 140.00 cm/s MV E/A ratio:  0.74 Sonia Chiquito MD Electronically signed by Sonia Chiquito MD Signature Date/Time: 08/14/2021/1:49:52 PM    Final    VAS US CAROTID  Result Date: 08/15/2021 Carotid Arterial Duplex Study Patient Name:  Sonia Skinner  Date of Exam:   08/15/2021 Medical Rec #: [id number]    Accession #:    [id number] Date of Birth: [birth year]    Patient Gender: F Patient Age:   49 years Exam Location:  [facility] Procedure:      VAS US CAROTID Referring Phys: Rosalin Hawking --------------------------------------------------------------------------------  Indications:       TIA. Risk Factors:      Hypertension, Diabetes, prior CVA. Comparison Study:  03/14/18 prior Performing Technologist: Archie Patten RVS  Examination Guidelines: A complete evaluation includes B-mode imaging, spectral Doppler, color Doppler, and power Doppler as needed of all accessible portions of each vessel. Bilateral testing is considered an integral part of a complete examination. Limited examinations for reoccurring indications may be performed as noted.  Right Carotid Findings: +----------+--------+--------+--------+-------------------------+--------+             PSV cm/s EDV cm/s Stenosis Plaque Description        Comments  +----------+--------+--------+--------+-------------------------+--------+  CCA Prox   107      13                heterogenous                        +----------+--------+--------+--------+-------------------------+--------+  CCA Distal 66       12                heterogenous                        +----------+--------+--------+--------+-------------------------+--------+  ICA Prox   139      32       1-39%    heterogenous and calcific           +----------+--------+--------+--------+-------------------------+--------+  ICA Distal 107      31                                                     +----------+--------+--------+--------+-------------------------+--------+  ECA        232                                                            +----------+--------+--------+--------+-------------------------+--------+ +----------+--------+-------+--------+-------------------+             PSV cm/s EDV cms Describe Arm Pressure (mmHG)  +----------+--------+-------+--------+-------------------+  Subclavian 148                                            +----------+--------+-------+--------+-------------------+ +---------+--------+---+--------+--+---------+  Vertebral PSV cm/s 104 EDV cm/s 14 Antegrade  +---------+--------+---+--------+--+---------+  Left Carotid Findings: +----------+--------+-------+--------+--------------------------------+--------+             PSV cm/s EDV     Stenosis Plaque Description               Comments                       cm/s                                                        +----------+--------+-------+--------+--------------------------------+--------+  CCA Prox   111      15               heterogenous                               +----------+--------+-------+--------+--------------------------------+--------+  CCA Distal 57       11               heterogenous                               +----------+--------+-------+--------+--------------------------------+--------+  ICA Prox   339      94      60-79%   heterogenous, smooth and                                                         calcific                                   +----------+--------+-------+--------+--------------------------------+--------+  ICA Mid    181      40                                                          +----------+--------+-------+--------+--------------------------------+--------+  ICA Distal 131      43                                                          +----------+--------+-------+--------+--------------------------------+--------+  ECA        155                                                                   +----------+--------+-------+--------+--------------------------------+--------+ +----------+--------+--------+--------+-------------------+             PSV cm/s EDV cm/s Describe Arm Pressure (mmHG)  +----------+--------+--------+--------+-------------------+  Subclavian 119                                             +----------+--------+--------+--------+-------------------+ +---------+--------+--+--------+--+---------+  Vertebral PSV cm/s 58 EDV cm/s 14 Antegrade  +---------+--------+--+--------+--+---------+   Summary: Right Carotid: Velocities in the right ICA are consistent with a 1-39% stenosis. Left Carotid: Velocities in the left ICA are consistent with a 60-79% stenosis. Vertebrals: Bilateral vertebral arteries demonstrate antegrade flow. *See table(s) above for measurements and observations.  Electronically signed by Monica Martinez MD on 08/15/2021 at 12:00:03 PM.    Final     PHYSICAL EXAM  Temp:  [97.7 F (36.5 C)-98.9 F (37.2 C)] 98.9 F (37.2 C) (12/31 1206) Pulse Rate:  [74-91] 84 (12/31 1206) Resp:  [13-21] 21 (12/31 1206) BP: (125-146)/(40-67) 132/56 (12/31 1206) SpO2:  [95 %-100 %] 100 % (12/31 1206)  General - Well nourished, well developed, in no apparent distress. Ophthalmologic - fundi not visualized due to noncooperation. Cardiovascular - Regular rhythm and rate.  Mental Status -  orientation to time, place, and person were intact. Language including expression, naming, repetition, comprehension was assessed and found intact.  Cranial Nerves II - XII - II - Visual field intact OU. III, IV, VI - Extraocular movements intact. V - Facial sensation intact bilaterally. VII - Facial movement intact bilaterally. VIII - Hearing & vestibular intact bilaterally. X - Palate elevates symmetrically. XI - Chin turning & shoulder shrug intact bilaterally. XII - Tongue protrusion intact.  Motor Strength - The patients  strength was normal in RUE and LUE both 5/5, RUE 4+/5, LLE 5/5. No pronator drift noted. Bulk was normal and fasciculations were absent.   Motor Tone - Muscle tone was assessed at the neck and appendages and was normal. Reflexes - The patients reflexes were symmetrical in all extremities and she had no pathological reflexes. Sensory - sensation on the left side of the face is dull compared to the right. Sensation in the LUE is dull compared to the RUE. BLE sensation equal and intact. Coordination - The patient had normal movements in the hands and feet with no ataxia or dysmetria.  Tremor was absent. Gait and Station - deferred.  ASSESSMENT/PLAN Ms. GILMA BESSETTE is a 70 y.o. female with history of stroke in 2015 with residual left sided weakness, anxiety, left carotid artery occlusion, prior left CEA, DM, HTN, prior cervical spine anterior fixation with posterior microdiscectomy and thyroid disease presenting with left side numbness and weakness.  She does endorse the symptoms from her previous stroke however states that they are worsened.  The drooling sensation she previously had has seemed to resolved.  CT had showed no acute abnormality and the MRI showed her previous stroke.  Carotid Dopplers are pending, 2D echo showed EF of 70 to 75% with left ventricle hyperdynamic function.  LDL is 149, hemoglobin A1c is 7.2.  She did have an episode of hypoglycemia and was given D10 until her diet was resumed  TIA vs. anxiety Code Stroke CT - No acute abnormality. ASPECTS 10.    MRI  remote left frontoparietal cortex infarct MRA  Atheromatous irregularity of the bilateral cavernous carotids with at least 50% stenosis, appearance stable from 2015. Carotid Doppler  right ICA are consistent with a 1-39% stenosis. Left Carotid: Velocities in the left ICA are consistent with a 60-79% stenosis.  Symptomatic left ICA stenosis with  h/o CEA on same side. Consult vascular.  2D Echo EF 70-75% LV hyperdynamic  function LDL 109 in 03/2021 149. High dose statin on discharge. HgbA1c 8.5 this admission in 03/2021 7.2. Need more aggressive mgt outpt. VTE prophylaxis - Lovenox 30mg  aspirin 325 mg daily prior to admission, now on aspirin 325 mg daily.  DAPT with aspirin 81mg  and plavix 75mg  daily x 3 week then aspirin 325mg  Therapy recommendations:  none.  OT no follow up requested Disposition:  pending  Hypertension Home meds:  hydralazine 25mg  Stable Long-term BP goal normotensive  Hyperlipidemia Home meds:  Atorvastatin 40mg , resumed in hospital LDL 109 goal < 70 Continue statin at discharge. Increase to atorvastatin 80mg  high dose.  Diabetes type II Uncontrolled Home meds:  Toujeo solostar, Farxiga 10 mg HgbA1c 8.5, goal < 7.0 CBGs SSI Episode of hypoglycemia, started on D10 until diet resumed.   Other Stroke Risk Factors Advanced Age >/= 11  Hx stroke/TIA MCA scattered stroke in 2015 Previous episode of left side weakness after stroke and diminished sensation on the left side Coronary artery disease Previous CEA for ICA stenosis after MCA stroke on 11/04/2013  Other Active Problems CKD stage III-primary team to manage Hold ARB and Farxiga Depression/anxiety Resume home medications per primary team   Total of 30 mins spent reviewing chart, discussion with patient  on prognosis, Dx and plan. Discussed case with patient's nurse and Dr. Jamse Arn. Reviewed Imaging personally.    To contact Stroke Continuity provider, please refer to [email or web address] After hours, contact General Neurology

## 2021-08-15 NOTE — Plan of Care (Signed)

## 2021-08-15 NOTE — Care Management Obs Status (Signed)
[place] NOTIFICATION   Patient Details  Name: PERIAN TEDDER MRN: [id number] Date of Birth: 11-30-1950   Medicare Observation Status Notification Given:  Yes    Carles Collet, RN 08/15/2021, 11:19 AM

## 2021-08-15 NOTE — Progress Notes (Signed)
Nsg Discharge Note  Admit Date:  08/13/2021 Discharge date: 08/15/2021   [name] to be D/C'd Home per MD order.  AVS completed.    Discharge Medication: Allergies as of 08/15/2021       Reactions   Anesthetics, Ester Anaphylaxis        Medication List     STOP taking these medications    aspirin 325 MG tablet Replaced by: aspirin EC 81 MG tablet       TAKE these medications    Accu-Chek FastClix Lancets Misc Use to check blood sugar five times daily.   Accu-Chek Guide test strip Generic drug: glucose blood Use to check blood sugars five times daily.   acetaminophen 500 MG tablet Commonly known as: TYLENOL Take 1,000-1,500 mg by mouth every 6 (six) hours as needed for mild pain.   Acetaminophen-Codeine 300-30 MG tablet Commonly known as: TYLENOL/CODEINE #3 Take 1 tablet by mouth every 6 (six) hours as needed for pain. Notes to patient: Did not receive in hospital; resume as directed.   amitriptyline 100 MG tablet Commonly known as: ELAVIL Take 1 tablet (100 mg total) by mouth at bedtime. Notes to patient: Did not receive in hospital; resume as directed.   aspirin EC 81 MG tablet Take 1 tablet (81 mg total) by mouth daily for 21 days. Swallow whole. Replaces: aspirin 325 MG tablet   aspirin EC 325 MG tablet Take 1 tablet (325 mg total) by mouth daily. Start taking on: September 05, 2021   atorvastatin 40 MG tablet Commonly known as: LIPITOR Take 1 tablet (40 mg total) by mouth daily. What changed: when to take this   blood glucose meter kit and supplies Kit Use to test blood sugars   buPROPion 300 MG 24 hr tablet Commonly known as: WELLBUTRIN XL Take 1 tablet (300 mg total) by mouth daily.   clopidogrel 75 MG tablet Commonly known as: Plavix Take 1 tablet (75 mg total) by mouth daily for 21 days.   dapagliflozin propanediol 10 MG Tabs tablet Commonly known as: Farxiga Take 1 tablet (10 mg total) by mouth daily before breakfast. Notes to  patient: Did not receive in hospital; resume as directed.   dexlansoprazole 60 MG capsule Commonly known as: DEXILANT Take 1 capsule by mouth daily. Notes to patient: Did not receive in hospital; resume as directed.   Edarbyclor 40-12.5 MG Tabs Generic drug: Azilsartan-Chlorthalidone Take 1 tablet by mouth daily.   FreeStyle Libre 2 Reader [place] 1 Act by Does not apply route daily.   FreeStyle Libre 2 Sensor Misc 1 Act by Does not apply route daily.   gabapentin 100 MG capsule Commonly known as: NEURONTIN Take 1 capsule by mouth twice daily.   hydrALAZINE 25 MG tablet Commonly known as: APRESOLINE Take 1 tablet by mouth in the morning and at bedtime. Notes to patient: Did not receive in hospital; resume as directed.   Insulin Pen Needle 32G X 4 MM Misc Use to administer insulin four times a day   levothyroxine 112 MCG tablet Commonly known as: SYNTHROID Take 1 tablet by mouth daily before breakfast. What changed: when to take this   loratadine 10 MG tablet Commonly known as: CLARITIN Take 1 tablet (10 mg total) by mouth daily.   Semaglutide (2 MG/DOSE) 8 MG/3ML Sopn Inject 2 mg as directed once a week. Notes to patient: Did not receive in hospital; resume as directed.   Ozempic (1 MG/DOSE) 4 MG/3ML Sopn Generic drug: Semaglutide (1 MG/DOSE) Inject 4  mg into the skin once a week. Notes to patient: Did not receive in hospital; resume as directed.   Toujeo SoloStar 300 UNIT/ML Solostar Pen Generic drug: insulin glargine (1 Unit Dial) Inject 50 units under the skin every morning. What changed: See the new instructions.   VITAMIN D3 PO Take 1 tablet by mouth daily. Notes to patient: Did not receive in hospital; resume as directed.        Discharge Assessment: Vitals:   08/15/21 0809 08/15/21 1206  BP: (!) 135/47 (!) 132/56  Pulse: 75 84  Resp: 16 (!) 21  Temp: 98.9 F (37.2 C) 98.9 F (37.2 C)  SpO2: 99% 100%   Skin clean, dry and intact without  evidence of skin break down, no evidence of skin tears noted. IV catheter discontinued intact. Site without signs and symptoms of complications - no redness or edema noted at insertion site, patient denies c/o pain - only slight tenderness at site.  Dressing with slight pressure applied.  D/c Instructions-Education: Discharge instructions given to patient/family with verbalized understanding. D/c education completed with patient/family including follow up instructions, medication list, d/c activities limitations if indicated, with other d/c instructions as indicated by MD - patient able to verbalize understanding, all questions fully answered. Patient instructed to return to ED, call 911, or call MD for any changes in condition.  Patient escorted via [place], and D/C home via private auto.  Hiram Comber, RN 08/15/2021 3:41 PM

## 2021-08-15 NOTE — Evaluation (Signed)
Speech Language Pathology Evaluation Patient Details Name: Sonia Skinner MRN: [id number] DOB: 09/01/1950 Today's Date: 08/15/2021 Time: 3154-0086 SLP Time Calculation (min) (ACUTE ONLY): 45 min  Problem List:  Patient Active Problem List   Diagnosis Date Noted   TIA (transient ischemic attack) 08/14/2021   Musculoskeletal back pain 08/03/2021   Encounter for general adult medical examination with abnormal findings 04/08/2021   Deficiency anemia 04/24/2020   Stage 3b chronic kidney disease ([place]) 04/02/2020   Visit for screening mammogram 04/01/2020   Type 2 diabetes mellitus with complication, with long-term current use of insulin ([place]) 09/18/2018   Age-related osteoporosis with current pathological fracture 02/20/2018   Intractable migraine without aura and with status migrainosus 11/24/2016   Left thyroid nodule 05/05/2016   GERD (gastroesophageal reflux disease) 03/08/2016   Major depressive disorder, recurrent episode ([place]) 02/20/2016   Occlusion and stenosis of carotid artery with cerebral infarction 11/25/2013   Cerebrovascular accident (CVA) due to stenosis of left middle cerebral artery ([place]) 11/02/2013   Vitamin D deficiency 10/19/2013   Tobacco abuse 07/31/2013   Essential hypertension, benign 04/20/2013   Type II diabetes mellitus with manifestations ([place]) 04/20/2013   Hypothyroidism 04/20/2013   Hyperlipidemia with target LDL less than 70 04/20/2013   Past Medical History:  Past Medical History:  Diagnosis Date   Ankle fracture    Left   Anxiety    Carotid artery occlusion    Closed fracture of left distal fibula 02/18/1949   Complication of anesthesia    ALLERY TO ESTER BASE   Depression    early 30s   Diabetes mellitus    INSULIN DEPENDENT   GERD (gastroesophageal reflux disease)    Headache    years ago   Hypertension    Pneumonia    Stroke [facility]) March 2015    left MCA infarct, slight weakness on left side   Thyroid disease    Past Surgical History:   Past Surgical History:  Procedure Laterality Date   ABDOMINAL HYSTERECTOMY     ANTERIOR FIXATION AND POSTERIOR MICRODISCECTOMY CERVICAL SPINE  1999   CAROTID ENDARTERECTOMY Left 11-04-13   cea   ENDARTERECTOMY Left 11/04/2013   ORIF ANKLE FRACTURE Left 09/16/2017   Procedure: OPEN REDUCTION INTERNAL FIXATION (ORIF) LEFT ANKLE FRACTURE;  Surgeon: Marchia Bond, MD;  Location: [facility];  Service: Orthopedics;  Laterality: Left;   HPI:  This 70 y.o. female admitted with sensory dullness of entire left side.  MRA with no LVO, MRI of brain showed 1. No acute or reversible finding.2. Remote left frontoparietal cortex infarct. 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  She participated in SLUMS assessment, scoring a 26/30 with points lost in paragraph recall. Speech is clear and fluent. Expressive and receptive language are WNL.  Pt acknowledged that there are a lot of stressors in her life that may be contributing to anxiety and more difficulty with concentration and recall. She agrees it may be beneficial to seek help s/p discharge.  No SLP f/u is recommended.    SLP Assessment  SLP Recommendation/Assessment: Patient does not need any further [facility] SLP Visit Diagnosis: Cognitive communication deficit (R41.841)    Recommendations for follow up therapy are one component of a multi-disciplinary discharge planning process, led by the attending physician.  Recommendations may be updated based on patient status, additional functional criteria and insurance authorization.    Follow Up Recommendations  No SLP follow up                SLP Evaluation Cognition  Overall Cognitive Status: Within Functional Limits for tasks  assessed       Comprehension  Auditory Comprehension Overall Auditory Comprehension: Appears within functional limits for tasks assessed Visual Recognition/Discrimination Discrimination: Within Function Limits Reading Comprehension Reading Status: Within funtional limits    Expression Expression Primary Mode of Expression: Verbal Verbal Expression Overall Verbal Expression: Appears within functional limits for tasks assessed Written Expression Dominant Hand: Left   Oral / Motor  Oral Motor/Sensory Function Overall Oral Motor/Sensory Function: Within functional limits Motor Speech Overall Motor Speech: Appears within functional limits for tasks assessed            Juan Quam Laurice 08/15/2021, 1:32 PM Estill Bamberg L. Tivis Ringer, [place] Office number [phone] Pager [phone]

## 2021-08-15 NOTE — Care Management (Signed)
°  Transition of Care [facility]) Screening Note   Patient Details  Name: Sonia Skinner Date of Birth: [birth year]   Transition of Care [facility]) CM/SW Contact:    Carles Collet, RN Phone Number: 08/15/2021, 11:20 AM    Transition of Care Department [facility]) has reviewed patient and no TOC needs have been identified at this time. We will continue to monitor patient advancement through interdisciplinary progression rounds. If new patient transition needs arise, please place a TOC consult.

## 2021-08-15 NOTE — Evaluation (Signed)
Physical Therapy Evaluation and Discharge Patient Details Name: Sonia Skinner MRN: [id number] DOB: May 26, 1951 Today's Date: 08/15/2021  History of Present Illness  This 70 y.o. female admitted with sensory dullness of entire left side.  MRA with no LVO, MRI of brain showed 1. No acute or reversible finding.2. Remote left frontoparietal cortex infarct. PMH includes: CVA, ankle fx, anxiety, carotid artery occlusion s/p carotid endarterectomy, DM, HTN  Clinical Impression  Patient evaluated by Physical Therapy with no further acute PT needs identified. 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 Pt reports improvement in left sided sensory deficits. Overall, pt is mobilizing well. Ambulating 350 feet with no assistive device independently. Scoring 22/24 on the Dynamic Gait Index, indicating she is not at high risk for falls. All education has been completed and the patient has no further questions. No follow-up Physical Therapy or equipment needs. PT is signing off. Thank you for this referral.      Recommendations for follow up therapy are one component of a multi-disciplinary discharge planning process, led by the attending physician.  Recommendations may be updated based on patient status, additional functional criteria and insurance authorization.  Follow Up Recommendations No PT follow up    Assistance Recommended at Discharge None  Functional Status Assessment Patient has not had a recent decline in their functional status  Equipment Recommendations  None recommended by PT    Recommendations for Other Services       Precautions / Restrictions Precautions Precautions: None Restrictions Weight Bearing Restrictions: No      Mobility  Bed Mobility Overal bed mobility: Independent                  Transfers Overall transfer level: Independent Equipment used: None                    Ambulation/Gait Ambulation/Gait assistance: Independent Gait Distance  (Feet): 350 Feet Assistive device: None Gait Pattern/deviations: WFL(Within Functional Limits)          Stairs            Wheelchair Mobility    Modified Rankin (Stroke Patients Only) Modified Rankin (Stroke Patients Only) Pre-Morbid Rankin Score: No symptoms Modified Rankin: No significant disability     Balance Overall balance assessment: No apparent balance deficits (not formally assessed)                               Standardized Balance Assessment Standardized Balance Assessment : Dynamic Gait Index   Dynamic Gait Index Level Surface: Normal Change in Gait Speed: Mild Impairment Gait with Horizontal Head Turns: Normal Gait with Vertical Head Turns: Normal Gait and Pivot Turn: Normal Step Over Obstacle: Normal Step Around Obstacles: Normal Steps: Mild Impairment Total Score: 22       Pertinent Vitals/Pain Pain Assessment: Faces Faces Pain Scale: No hurt    Home Living Family/patient expects to be discharged to:: Private residence Living Arrangements: Other relatives (sister) Available Help at Discharge: Family;Available 24 hours/day Type of Home: House Home Access: Level entry       Home Layout: One level Home Equipment: None      Prior Function Prior Level of Function : Independent/Modified Independent;Working/employed;Driving               ADLs Comments: 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; assists with IADL's and showering     Hand  Dominance   Dominant Hand: Left    Extremity/Trunk Assessment   Upper Extremity Assessment Upper Extremity Assessment: Defer to OT evaluation    Lower Extremity Assessment Lower Extremity Assessment: RLE deficits/detail;LLE deficits/detail RLE Deficits / Details: Strength 5/5 LLE Deficits / Details: Strength 5/5    Cervical / Trunk Assessment Cervical / Trunk Assessment: Normal  Communication   Communication: No difficulties  Cognition  Arousal/Alertness: Awake/alert Behavior During Therapy: WFL for tasks assessed/performed Overall Cognitive Status: Within Functional Limits for tasks assessed                                          General Comments      Exercises     Assessment/Plan    PT Assessment Patient does not need any further PT services  PT Problem List         PT Treatment Interventions      PT Goals (Current goals can be found in the Care Plan section)  Acute Rehab PT Goals Patient Stated Goal: return home, regain energy PT Goal Formulation: All assessment and education complete, DC therapy    Frequency     Barriers to discharge        Co-evaluation               AM-PAC PT "6 Clicks" Mobility  Outcome Measure Help needed turning from your back to your side while in a flat bed without using bedrails?: None Help needed moving from lying on your back to sitting on the side of a flat bed without using bedrails?: None Help needed moving to and from a bed to a chair (including a wheelchair)?: None Help needed standing up from a chair using your arms (e.g., wheelchair or bedside chair)?: None Help needed to walk in hospital room?: None Help needed climbing 3-5 steps with a railing? : A Little 6 Click Score: 23    End of Session   Activity Tolerance: Patient tolerated treatment well Patient left: in bed;with call bell/phone within reach;with bed alarm set Nurse Communication: Mobility status PT Visit Diagnosis: Other symptoms and signs involving the nervous system (R29.898)    Time: 6433-2951 PT Time Calculation (min) (ACUTE ONLY): 17 min   Charges:   PT Evaluation $PT Eval Low Complexity: 1 Low          Wyona Almas, PT, DPT Acute Rehabilitation Services Pager [phone] Office [phone]   Deno Etienne 08/15/2021, 11:27 AM

## 2021-08-20 ENCOUNTER — Other Ambulatory Visit: Payer: Self-pay | Admitting: Internal Medicine

## 2021-08-21 ENCOUNTER — Ambulatory Visit (INDEPENDENT_AMBULATORY_CARE_PROVIDER_SITE_OTHER): Payer: Commercial Managed Care - HMO | Admitting: Physician Assistant

## 2021-08-21 ENCOUNTER — Encounter: Payer: Self-pay | Admitting: Physician Assistant

## 2021-08-21 ENCOUNTER — Other Ambulatory Visit: Payer: Self-pay

## 2021-08-21 VITALS — BP 147/60 | HR 93 | Resp 20 | Ht 62.0 in | Wt 130.0 lb

## 2021-08-21 DIAGNOSIS — G459 Transient cerebral ischemic attack, unspecified: Secondary | ICD-10-CM | POA: Diagnosis not present

## 2021-08-21 DIAGNOSIS — I63512 Cerebral infarction due to unspecified occlusion or stenosis of left middle cerebral artery: Secondary | ICD-10-CM

## 2021-08-21 NOTE — Patient Instructions (Signed)
Follow up in 6 month Continue taken the Aspirin and Plavix as recommended Continue taking the Lipitor and BP medications Make sure you follow up on the thyroid levels  Stay active at least 30 mins 3 times a week

## 2021-08-21 NOTE — Progress Notes (Addendum)
[facility] Clinic Note - Initial Visit   Date: 08/21/21  Sonia Skinner MRN: [id number] DOB: [birth year]   Dear Dr Ronnald Ramp Arvid Right, MD:  Thank you for your kind referral of Sonia Skinner for consultation of Sonia Skinner . Although her history is well known to you, please allow [place] to reiterate it for the purpose of our medical record. The patient  is here alone     History of Present Illness:  Sonia Skinner is a 71 y.o. L handed woman with a history of CVA in 2015, with residual left-sided weakness, anxiety, history of left carotid artery occlusion, prior left CEA, history of diabetes, hypertension, history of prior cervical spine anterior fixation with posterior microdiscectomy and thyroid disease, who presented to the ED on 08/13/2021 with acute onset of sensory dullness on the entire left side.  She also endorsed all left-sided facial weakness from her prior stroke, and chronic left upper extremity weakness, without lower extremity weakness.  She also reported drooling sensation resolved on presentation to the ED.  CT of the head showed no acute abnormalities, MRI was only remarkable for prior stroke, without new acute findings.  Carotid Dopplers showed mild stenosis in the right carotid artery, moderate 60 to 79% left ICA stenosis.  2D echo showed EF 70 to 75% with left ventricle hyperdynamic function, HbA1c 8.5.  She was treated conservatively.  She was evaluated by stroke specialist Dr. Erlinda Hong with note that etiology of symptoms not quite clear, concerning for anxiety, however cannot rule out TIA. She does have risk factors for TIA, with bilateral ICA siphon stenosis, hypertension, hyperlipidemia, DM, and was discharged on 08/14/2021 with significant improvement of her symptoms, "much less numbness and tingling and drooping of the face."  She was discharged on statin, DAPT ASA 81 mg, and Plavix 75 mg for 21 days, then resuming aspirin 325 mg daily. Denies vertigo dizziness,  vision changes or dysarthria. No confusion or seizures. Denies any chest pain, or shortness of breath or recent long distance trips. Denies tobacco. No new meds or hormonal supplements. She has mild dysphagia that has been present since her stroke in 2015 but does not affect her speech. She is compliant with her medications .  Denies any recent surgeries. No sick contacts. No new stressors present in personal life but she is still working as a Pharmacist, hospital '[employer] which "can increase my anxiety at times". She notices that has been very "jittery" lately. She has mild tremors related to anxiety. He memory is "about the same", at times "taking the wrong turn when driving". She has been evaluated at our office in 2019, and her memory issues were felt to be due to multiple causes including prior stroke, anxiety, diabetes, thyroid levels and depression. Many of these problems are still being worked up except her thyroid levels. She has a history of migraine headaches worse when BP is high, during those time her vision becomes "blurry", then resolving when BP normalizes .  Denies any family history of stroke    Out-side paper records, electronic medical record, and images have been reviewed where available and summarized as:   TSH 0.053 (low) Lipid panel shows total cholesterol 196, triglycerides elevated at 234, HDL low at 40, and LDL 109. CBC showed a white count of 12.2, hemoglobin 11.1, hematocrit 35.4 normal platelets PT 13.8, INR 1.1 Hemoglobin A1c was 8.5 COVID-negative  MRI of the brain without contrast 08/14/2021 shows no acute or reversible findings, remote left  frontoparietal cortex infarct  MRA of the head 08/14/2021 without acute findings, bilateral carotid artery stenosis, stable since 2015 at 50%    Carotid Doppler  right ICA are consistent with a 1-39% stenosis. Left Carotid: Velocities in the left ICA are consistent with a 60-79% stenosis.  Symptomatic left ICA stenosis with h/o CEA on same  side.    2D Echo EF 70-75% LV hyperdynamic function    Initial visit for memory issues 05/04/18 This is a 71 year old left-handed woman with a history of left MCA stroke secondary to left ICA stenosis s/p CEA in 2015, hypertension, hyperlipidemia, diabetes, hypothyroidism, presenting for evaluation of worsening memory. She had been seeing neurologist Dr. Leonie Man for several years and had reported memory difficulties since her stroke, and underwent Neuropsychological testing with Dr. Valentina Shaggy in April 2016, it was noted that "cognitive difficulties are likely due to a combination of psychological and neurological factors. It is probable that depression, fatigue, anxiety, and frustration, which to some extent pre-dated her stroke, are significantly disrupting her everyday cognitive functioning. While her neuropsychological deficit profile was indicative of a right brain stroke, neuroradiological studies showed left hemisphere infarcts. This discrepancy cannot be explained. The intactness of her language functioning might be explained by assuming that this left-handed woman is right brain dominant for language. Finally, her diabetes, especially if poorly controlled, could also be contributing to her cognitive difficulties." She reports that she loses her train of thought at least once a day. She misplaces things frequently. She has been told by her sister that she repeats herself. She forgot her doctor's appointment last week. She has missed a bill payment once or twice. She occasionally misses medications, if she does not keep the pillbox in front of her, she would forget it. Her last HbA1c was very elevated 15.6. She denies getting lost driving. No family history of dementia or stroke. She recalls a head injury 10 years ago where she hit a wall and broke her teeth. She rarely drinks alcohol. Although the stroke was in the left MCA territory, she reports that it feels that her left side is affected more. Her  handwriting is different, she has numbness, tingling, and slight weakness in her left hand and leg. She has headaches in the occipital region with throbbing or sharp/stabbing pain 1-2 times a week, lasting up to an hour. No associated nausea/vomiting, vision changes. She does not take any medication for the headache. She has dizziness that she believes is related to her boot on the left foot. She broke her ankle in January 2019 and continues to wear a boot. She has occasional difficulty swallowing, occasional blurred vision. She denies any diplopia, dysarthria, neck/back pain, bowel/bladder dysfunction, anosmia, or tremors. She is scheduled to see Vascular for follow-up on carotid arteries at the end of the month. Mood is not so good.  Lab Results  Component Value Date   HGBA1C 8.5 (H) 08/15/2021   Lab Results  Component Value Date   [id number] 04/24/2020   Lab Results  Component Value Date   TSH 0.053 (L) 08/15/2021   No results found for: Micheline Rough  Past Medical History:  Diagnosis Date   Ankle fracture    Left   Anxiety    Carotid artery occlusion    Closed fracture of left distal fibula 10/14/4968   Complication of anesthesia    ALLERY TO ESTER BASE   Depression    early 78s   Diabetes mellitus    INSULIN DEPENDENT  GERD (gastroesophageal reflux disease)    Headache    years ago   Hypertension    Pneumonia    [place]roke [facility] March 2015    left MCA infarct, slight weakness on left side   Thyroid disease     Past Surgical History:  Procedure Laterality Date   ABDOMINAL HYSTERECTOMY     ANTERIOR FIXATION AND POSTERIOR MICRODISCECTOMY CERVICAL SPINE  1999   CAROTID ENDARTERECTOMY Left 11-04-13   cea   ENDARTERECTOMY Left 11/04/2013   ORIF ANKLE FRACTURE Left 09/16/2017   Procedure: OPEN REDUCTION INTERNAL FIXATION (ORIF) LEFT ANKLE FRACTURE;  Surgeon: Marchia Bond, MD;  Location: [facility];  Service: Orthopedics;  Laterality: Left;     Medications:   Outpatient Encounter Medications as of 08/21/2021  Medication Sig Note   Accu-Chek FastClix Lancets MISC Use to check blood sugar five times daily.    acetaminophen (TYLENOL) 500 MG tablet Take 1,000-1,500 mg by mouth every 6 (six) hours as needed for mild pain.    Acetaminophen-Codeine (TYLENOL/CODEINE #3) 300-30 MG tablet Take 1 tablet by mouth every 6 (six) hours as needed for pain.    [START ON 09/05/2021] aspirin EC 325 MG tablet Take 1 tablet (325 mg total) by mouth daily.    atorvastatin (LIPITOR) 40 MG tablet Take 1 tablet (40 mg total) by mouth daily. (Patient taking differently: Take 40 mg by mouth at bedtime.)    blood glucose meter kit and supplies KIT Use to test blood sugars    buPROPion (WELLBUTRIN XL) 300 MG 24 hr tablet Take 1 tablet (300 mg total) by mouth daily.    Cholecalciferol (VITAMIN D3 PO) Take 1 tablet by mouth daily.    clopidogrel (PLAVIX) 75 MG tablet Take 1 tablet (75 mg total) by mouth daily for 21 days.    Continuous Blood Gluc Receiver (FREESTYLE LIBRE 2 READER) DEVI 1 Act by Does not apply route daily.    dapagliflozin propanediol (FARXIGA) 10 MG TABS tablet Take 1 tablet (10 mg total) by mouth daily before breakfast.    dexlansoprazole (DEXILANT) 60 MG capsule Take 1 capsule (60 mg total) by mouth daily.    EDARBYCLOR 40-12.5 MG TABS Take 1 tablet by mouth daily.    glucose blood (ACCU-CHEK GUIDE) test strip Use to check blood sugars five times daily.    Insulin Pen Needle 32G X 4 MM MISC Use to administer insulin four times a day    levothyroxine (SYNTHROID) 112 MCG tablet Take 1 tablet by mouth daily before breakfast. (Patient taking differently: Take 112 mcg by mouth daily.)    loratadine (CLARITIN) 10 MG tablet Take 1 tablet (10 mg total) by mouth daily.    TOUJEO SOLOSTAR 300 UNIT/ML Solostar Pen Inject 50 units under the skin every morning. (Patient taking differently: Inject 35-50 Units into the skin daily.)    amitriptyline (ELAVIL) 100 MG tablet Take 1  tablet (100 mg total) by mouth at bedtime. (Patient not taking: Reported on 08/14/2021) 08/14/2021: Makes drowsy the next day   aspirin EC 81 MG tablet Take 1 tablet (81 mg total) by mouth daily for 21 days. Swallow whole. (Patient not taking: Reported on 08/21/2021)    Continuous Blood Gluc Sensor (FREESTYLE LIBRE 2 SENSOR) MISC 1 Act by Does not apply route daily. (Patient not taking: Reported on 08/04/2021)    gabapentin (NEURONTIN) 100 MG capsule Take 1 capsule by mouth twice daily.  (Patient not taking: Reported on 08/14/2021)    hydrALAZINE (APRESOLINE) 25 MG tablet Take 1 tablet  by mouth in the morning and at bedtime. (Patient not taking: Reported on 08/14/2021)    OZEMPIC, 1 MG/DOSE, 4 MG/3ML SOPN Inject 4 mg into the skin once a week. (Patient not taking: Reported on 08/21/2021) 08/14/2021: Pt does not always use, depends on appetite    Semaglutide, 2 MG/DOSE, 8 MG/3ML SOPN Inject 2 mg as directed once a week. (Patient not taking: Reported on 08/14/2021)    No facility-administered encounter medications on file as of 08/21/2021.    Allergies:  Allergies  Allergen Reactions   Anesthetics, Ester Anaphylaxis    Family History: Family History  Problem Relation Age of Onset   Diabetes Mother    Heart disease Mother        Before age 101   Cancer Father        Lung   Hypertension Sister    Diabetes Sister    Diabetes Sister    Diabetes Sister     Social History: Social History   Tobacco Use   Smoking status: Former    Packs/day: 1.00    Years: 20.00    Pack years: 20.00    Types: Cigarettes    Quit date: 11/01/2013    Years since quitting: 7.8   Smokeless tobacco: Never  Vaping Use   Vaping Use: Never used  Substance Use Topics   Alcohol use: Not Currently    Alcohol/week: 1.0 - 2.0 standard drink    Types: 1 - 2 Standard drinks or equivalent per week   Drug use: No   Social History   Social History Narrative   Patient lives in 1 story home with sister.   Caffeine  Use: 2 cups daily; sodas occasionally   Some college education   Works as Oceanographer for [employer]    Vital Signs:  BP (!) 147/60    Pulse 93    Resp 20    Ht _0  (1.575 m)    Wt 130 lb (59 kg)    SpO2 96%    BMI 23.78 kg/m    General Medical Exam:   General:  Well appearing, anxious appearing, flat affect. Eyes/ENT: PERRLA, EOMI.  Neck:   No carotid bruits. Respiratory:  Clear to auscultation, good air entry bilaterally.   Cardiac:  Regular rate and rhythm, no murmur.   Extremities:  No deformities, edema, or skin discoloration.  Skin:  No rashes or lesions.  Neurological Exam: MENTAL STATUS including orientation to time, place, person, recent and remote memory, attention span and concentration, language, and fund of knowledge is normal.  Speech is not dysarthric.  CRANIAL NERVES: II:  No visual field defects.  Unremarkable fundi.   III-IV-VI: Pupils equal round and reactive to light.  Normal conjugate, extra-ocular eye movements in all directions of gaze.  No nystagmus.  No ptosis.   V:  Normal facial sensation.    VII:  Normal facial symmetry and movements.   VIII:  Normal hearing and vestibular function.   IX-X:  Normal palatal movement.   XI:  Normal shoulder shrug and head rotation.   XII:  Normal tongue strength and range of motion, no deviation or fasciculation.  MOTOR:  No atrophy, mild intention tremor L UE, no cogwheeling or abnormal movements.  No pronator drift.    SENSORY:  Normal and symmetric perception of light touch, pinprick, vibration, and proprioception.  Romberg's sign absent.   COORDINATION/GAIT: Normal finger-to- nose-finger and heel-to-shin.  Intact rapid alternating movements bilaterally.  Able to rise from a chair  without using arms.  Gait narrow based and stable. Tandem and stressed gait intact.    IMPRESSION/PLAN:  Left-sided numbness, etiology of symptoms not quite clear, concerning for anxiety, however cannot rule out TIA. She does have risk  factors for TIA, with bilateral ICA siphon stenosis, hypertension, hyperlipidemia, DM. Continue ASA and Plavix until day 21, then ASA 353m daily Monitor all the cardiovascular risk factors, goal HbA1c <7.0 Follow up with PCP on abnormal thyroid levels   Recommend follow up with Behavioral Therapy for anxiety and depression  Return to clinic in 6 months. If symptoms stable or improved, may be followed by PCP   Total time spent:  40   Thank you for allowing me to participate in patient's care.  If I can answer any additional questions, I would be pleased to do so.    Sincerely,   SSharene Butters PA-C

## 2021-08-25 DIAGNOSIS — E119 Type 2 diabetes mellitus without complications: Secondary | ICD-10-CM | POA: Diagnosis not present

## 2021-08-25 NOTE — Progress Notes (Signed)
Cardiology Office Note:    Date:  08/26/2021   ID:  Sonia Skinner, DOB 03-02-1951, MRN [id number]  PCP:  [name], MD   [facility] Providers Cardiologist:  None {   Referring MD: [name], MD    History of Present Illness:    Sonia Skinner is a 71 y.o. female with a hx of stroke in 2015 with residual left sided weakness, left carotid artery occlusion s/p left CEA, DM, HTN, prior cervical spine anterior fixation with posterior microdiscectomy and thyroid disease who was referred to clinic by Dr. Ronnald Ramp for follow-up for recent hospitalization of TIA.  The patient was hospitalized at [facility] from 08/13/21-08/15/21 after she presented with left sided numbness and weakness. CT head  showed no acute abnormality and the MRI showed her previous stroke.  Carotid Dopplers revealed: Right Carotid: Velocities in the right ICA are consistent with a 1-39% stenosis. Left Carotid: Velocities in the left ICA are consistent with a 60-79%  , 2D echo showed EF of 70 to 75% with left ventricle hyperdynamic function. Patient was managed conservatively and treated with ASA $Remove'81mg'OHXIjDO$  and plavix $RemoveBe'75mg'pYXNAAsVL$  for planned 21 days followed by ASA $Remo'325mg'MbRox$  daily.  Today, the patient overall feels better. Left sided symptoms are improving. No chest pain, SOB, palpitations, orthopnea, PND, LE edema. No history atrial fibrillation that she knows of. No bleeding issues. Blood pressures have been running high in 140s at home. Has not scheduled follow-up with vascular surgery for CAS.  Past Medical History:  Diagnosis Date   Ankle fracture    Left   Anxiety    Carotid artery occlusion    Closed fracture of left distal fibula 03/24/8915   Complication of anesthesia    ALLERY TO ESTER BASE   Depression    early 30s   Diabetes mellitus    INSULIN DEPENDENT   GERD (gastroesophageal reflux disease)    Headache    years ago   Hypertension    Pneumonia    Stroke [facility]) March 2015    left MCA infarct, slight weakness on left  side   Thyroid disease     Past Surgical History:  Procedure Laterality Date   ABDOMINAL HYSTERECTOMY     ANTERIOR FIXATION AND POSTERIOR MICRODISCECTOMY CERVICAL SPINE  1999   CAROTID ENDARTERECTOMY Left 11-04-13   cea   ENDARTERECTOMY Left 11/04/2013   ORIF ANKLE FRACTURE Left 09/16/2017   Procedure: OPEN REDUCTION INTERNAL FIXATION (ORIF) LEFT ANKLE FRACTURE;  Surgeon: Marchia Bond, MD;  Location: [place];  Service: Orthopedics;  Laterality: Left;    Current Medications: Current Meds  Medication Sig   Accu-Chek FastClix Lancets MISC Use to check blood sugar five times daily.   acetaminophen (TYLENOL) 500 MG tablet Take 1,000-1,500 mg by mouth every 6 (six) hours as needed for mild pain.   Acetaminophen-Codeine (TYLENOL/CODEINE #3) 300-30 MG tablet Take 1 tablet by mouth every 6 (six) hours as needed for pain.   amitriptyline (ELAVIL) 100 MG tablet Take 1 tablet (100 mg total) by mouth at bedtime.   amLODipine (NORVASC) 5 MG tablet Take 1 tablet (5 mg total) by mouth daily.   [START ON 09/05/2021] aspirin EC 325 MG tablet Take 1 tablet (325 mg total) by mouth daily.   aspirin EC 81 MG tablet Take 1 tablet (81 mg total) by mouth daily for 21 days. Swallow whole.   atorvastatin (LIPITOR) 40 MG tablet Take 1 tablet (40 mg total) by mouth daily.   blood glucose meter  kit and supplies KIT Use to test blood sugars   buPROPion (WELLBUTRIN XL) 300 MG 24 hr tablet Take 1 tablet (300 mg total) by mouth daily.   Cholecalciferol (VITAMIN D3 PO) Take 1 tablet by mouth daily.   clopidogrel (PLAVIX) 75 MG tablet Take 1 tablet (75 mg total) by mouth daily for 21 days.   Continuous Blood Gluc Receiver (FREESTYLE LIBRE 2 READER) DEVI 1 Act by Does not apply route daily.   Continuous Blood Gluc Sensor (FREESTYLE LIBRE 2 SENSOR) MISC 1 Act by Does not apply route daily.   dapagliflozin propanediol (FARXIGA) 10 MG TABS tablet Take 1 tablet (10 mg total) by mouth daily before breakfast.   dexlansoprazole  (DEXILANT) 60 MG capsule Take 1 capsule (60 mg total) by mouth daily.   EDARBYCLOR 40-12.5 MG TABS Take 1 tablet by mouth daily.   glucose blood (ACCU-CHEK GUIDE) test strip Use to check blood sugars five times daily.   Insulin Pen Needle 32G X 4 MM MISC Use to administer insulin four times a day   levothyroxine (SYNTHROID) 112 MCG tablet Take 1 tablet by mouth daily before breakfast.   loratadine (CLARITIN) 10 MG tablet Take 1 tablet (10 mg total) by mouth daily.   OZEMPIC, 1 MG/DOSE, 4 MG/3ML SOPN Inject 4 mg into the skin once a week.   Semaglutide, 2 MG/DOSE, 8 MG/3ML SOPN Inject 2 mg as directed once a week.   TOUJEO SOLOSTAR 300 UNIT/ML Solostar Pen Inject 50 units under the skin every morning.   [DISCONTINUED] hydrALAZINE (APRESOLINE) 25 MG tablet Take 1 tablet by mouth in the morning and at bedtime.     Allergies:   Anesthetics, ester   Social History   Socioeconomic History   Marital status: Legally Separated    Spouse name: Not on file   Number of children: 0   Years of education: College   Highest education level: Not on file  Occupational History   Occupation: [employer]    Employer: [employer]  Tobacco Use   Smoking status: Former    Packs/day: 1.00    Years: 20.00    Pack years: 20.00    Types: Cigarettes    Quit date: 11/01/2013    Years since quitting: 7.8   Smokeless tobacco: Never  Vaping Use   Vaping Use: Never used  Substance and Sexual Activity   Alcohol use: Not Currently    Alcohol/week: 1.0 - 2.0 standard drink    Types: 1 - 2 Standard drinks or equivalent per week   Drug use: No   Sexual activity: Not Currently  Other Topics Concern   Not on file  Social History Narrative   Patient lives in 1 story home with sister.   Caffeine Use: 2 cups daily; sodas occasionally   Some college education   Works as Oceanographer for [employer]   Social Determinants of Radio broadcast assistant Strain: Not on file  Food Insecurity: No Food Insecurity    Worried About Charity fundraiser in the Last Year: Never true   Arboriculturist in the Last Year: Never true  Transportation Needs: No Transportation Needs   Lack of Transportation (Medical): No   Lack of Transportation (Non-Medical): No  Physical Activity: Not on file  Stress: Stress Concern Present   Feeling of Stress : Very much  Social Connections: Not on file     Family History: The patient's family history includes Cancer in her father; Diabetes in her mother, sister,  sister, and sister; Heart disease in her mother; Hypertension in her sister.  ROS:   Please see the history of present illness.    Review of Systems  Constitutional:  Negative for malaise/fatigue.  Respiratory:  Negative for shortness of breath.   Cardiovascular:  Negative for chest pain, palpitations, orthopnea, claudication, leg swelling and PND.  Gastrointestinal:  Negative for blood in stool, nausea and vomiting.  Genitourinary:  Negative for hematuria.  Musculoskeletal:  Negative for falls.  Neurological:  Positive for tingling. Negative for dizziness and loss of consciousness.    EKGs/Labs/Other Studies Reviewed:    The following studies were reviewed today: TTE 2021/09/07: IMPRESSIONS   1. Left ventricular ejection fraction, by estimation, is 70 to 75%. The  left ventricle has hyperdynamic function. The left ventricle has no  regional wall motion abnormalities. Left ventricular diastolic parameters  are consistent with Grade I diastolic  dysfunction (impaired relaxation).   2. Right ventricular systolic function is normal. The right ventricular  size is normal. Tricuspid regurgitation signal is inadequate for assessing  PA pressure.   3. The mitral valve is degenerative. Trivial mitral valve regurgitation.  No evidence of mitral stenosis.   4. The aortic valve is tricuspid. Aortic valve regurgitation is not  visualized. No aortic stenosis is present.   5. The inferior vena cava is normal in size  with greater than 50%  respiratory variability, suggesting right atrial pressure of 3 mmHg.   Carotid Artery Dopplers 08/15/21: Summary:  Right Carotid: Velocities in the right ICA are consistent with a 1-39%  stenosis.   Left Carotid: Velocities in the left ICA are consistent with a 60-79%  stenosis.   Vertebrals: Bilateral vertebral arteries demonstrate antegrade flow.   *See table(s) above for measurements and observations.  EKG:  No new ECG  Recent Labs: 08/13/2021: ALT 14; BUN 22; Creatinine, Ser 1.80; Hemoglobin 12.2; Platelets 242; Potassium 3.9; Sodium 141 08/15/2021: TSH 0.053  Recent Lipid Panel    Component Value Date/Time   CHOL 196 08/15/2021 0044   TRIG 234 (H) 08/15/2021 0044   HDL 40 (L) 08/15/2021 0044   CHOLHDL 4.9 08/15/2021 0044   VLDL 47 (H) 08/15/2021 0044   LDLCALC 109 (H) 08/15/2021 0044   LDLCALC 97 04/01/2020 1041   LDLDIRECT 141 (H) 03/04/2010 2002     Risk Assessment/Calculations:           Physical Exam:    VS:  BP (!) 142/70    Pulse 92    Ht $R'5\' 2"'fq$  (1.575 m)    Wt 130 lb 6.4 oz (59.1 kg)    SpO2 96%    BMI 23.85 kg/m     Wt Readings from Last 3 Encounters:  08/26/21 130 lb 6.4 oz (59.1 kg)  08/21/21 130 lb (59 kg)  08/13/21 126 lb (57.2 kg)     GEN:  Well nourished, well developed in no acute distress HEENT: Normal NECK: No JVD; No carotid bruits CARDIAC: RRR, soft systolic murmur. No rubs, gallops RESPIRATORY:  Clear to auscultation without rales, wheezing or rhonchi  ABDOMEN: Soft, non-tender, non-distended MUSCULOSKELETAL:  No edema; No deformity  SKIN: Warm and dry NEUROLOGIC:  Alert and oriented x 3 PSYCHIATRIC:  Normal affect   ASSESSMENT:    1. Cerebrovascular accident (CVA) due to stenosis of left middle cerebral artery ([place])   2. Essential hypertension, benign   3. Stenosis of carotid artery, unspecified laterality   4. TIA (transient ischemic attack)   5. Diabetes mellitus with coincident hypertension (  [place])     PLAN:    In order of problems listed above:  #TIA with history of prior Stroke: Patient with recent hospitalization in 07/2021 for left sided numbness and weakness. CT head unremarkable. MRI with old stroke but no acute pathology. Was managed conservatively with ASA and plavix. -Continue ASA 81mg  daily and plavix 75mg  daily x21 days and then ASA 325mg  daily per neuro -Plan for 30day event monitor -Continue lipitor 20mg  daily   #Carotid artery disease s/p left CEA: Carotid ultrasound 07/2021 with 1-39% on the right, left with 60-79%. Now with concern for TIA as above. -Refer to vascular -Continue ASA 81mg , plavix 75 mg x 21 days and then ASA 325mg  daily -Continue lipitor 20mg  daily  #HTN: Elevated at 140s at home. -Start amlodipine 5mg  daily -Continue azilsartan-chlorthalidone 40-12.5mg  daily -Stop hydralazine (has already been off)  #HLD: -Continue lipitor 20mg  daily -Goal LDL<70  #DMII: -On Toujeo solostar, Farxiga 10 mg -Goal A1C<7; currently 8.5           Medication Adjustments/Labs and Tests Ordered: Current medicines are reviewed at length with the patient today.  Concerns regarding medicines are outlined above.  Orders Placed This Encounter  Procedures   Ambulatory referral to Vascular Surgery   Cardiac event monitor   Meds ordered this encounter  Medications   amLODipine (NORVASC) 5 MG tablet    Sig: Take 1 tablet (5 mg total) by mouth daily.    Dispense:  90 tablet    Refill:  3    Patient Instructions  Medication Instructions:   STOP TAKING HYDRALAZINE NOW  START TAKING AMLODIPINE 5 MG BY MOUTH DAILY  *If you need a refill on your cardiac medications before your next appointment, please call your pharmacy*   You have been referred BACK TO SEE DR. Harrell Gave DICKSON WITH [facility] TO FOLLOW YOUR CAROTIDS--PLEASE GET AN APPOINTMENT WITH THEM IN THE NEAR FUTURE    Testing/Procedures:  Your physician has recommended that you wear an  30 DAY event monitor. Event monitors are medical devices that record the hearts electrical activity. Doctors most often [place] these monitors to diagnose arrhythmias. Arrhythmias are problems with the speed or rhythm of the heartbeat. The monitor is a small, portable device. You can wear one while you do your normal daily activities. This is usually used to diagnose what is causing palpitations/syncope (passing out).    Follow-Up:  AS NEEDED WITH DR. Johney Frame      Signed, Freada Bergeron, MD  08/26/2021 3:46 PM    [place]

## 2021-08-26 ENCOUNTER — Encounter: Payer: Self-pay | Admitting: Cardiology

## 2021-08-26 ENCOUNTER — Other Ambulatory Visit: Payer: Self-pay

## 2021-08-26 ENCOUNTER — Ambulatory Visit (INDEPENDENT_AMBULATORY_CARE_PROVIDER_SITE_OTHER): Payer: Commercial Managed Care - HMO | Admitting: Cardiology

## 2021-08-26 ENCOUNTER — Other Ambulatory Visit: Payer: Self-pay | Admitting: Internal Medicine

## 2021-08-26 VITALS — BP 142/70 | HR 92 | Ht 62.0 in | Wt 130.4 lb

## 2021-08-26 DIAGNOSIS — I6529 Occlusion and stenosis of unspecified carotid artery: Secondary | ICD-10-CM | POA: Diagnosis not present

## 2021-08-26 DIAGNOSIS — G459 Transient cerebral ischemic attack, unspecified: Secondary | ICD-10-CM

## 2021-08-26 DIAGNOSIS — E118 Type 2 diabetes mellitus with unspecified complications: Secondary | ICD-10-CM

## 2021-08-26 DIAGNOSIS — E119 Type 2 diabetes mellitus without complications: Secondary | ICD-10-CM | POA: Diagnosis not present

## 2021-08-26 DIAGNOSIS — I63512 Cerebral infarction due to unspecified occlusion or stenosis of left middle cerebral artery: Secondary | ICD-10-CM

## 2021-08-26 DIAGNOSIS — I10 Essential (primary) hypertension: Secondary | ICD-10-CM

## 2021-08-26 MED ORDER — AMLODIPINE BESYLATE 5 MG PO TABS: 5.0000 mg | ORAL_TABLET | Freq: Every day | ORAL | 3 refills | Status: DC

## 2021-08-26 NOTE — Patient Instructions (Signed)
Medication Instructions:   STOP TAKING HYDRALAZINE NOW  START TAKING AMLODIPINE 5 MG BY MOUTH DAILY  *If you need a refill on your cardiac medications before your next appointment, please call your pharmacy*   You have been referred BACK TO SEE DR. Harrell Gave DICKSON WITH [facility] TO FOLLOW YOUR CAROTIDS--PLEASE GET AN APPOINTMENT WITH THEM IN THE NEAR FUTURE    Testing/Procedures:  Your physician has recommended that you wear an 30 DAY event monitor. Event monitors are medical devices that record the hearts electrical activity. Doctors most often [place] these monitors to diagnose arrhythmias. Arrhythmias are problems with the speed or rhythm of the heartbeat. The monitor is a small, portable device. You can wear one while you do your normal daily activities. This is usually used to diagnose what is causing palpitations/syncope (passing out).    Follow-Up:  AS NEEDED WITH DR. Johney Frame

## 2021-08-27 ENCOUNTER — Ambulatory Visit (INDEPENDENT_AMBULATORY_CARE_PROVIDER_SITE_OTHER): Payer: Commercial Managed Care - HMO | Admitting: Internal Medicine

## 2021-08-27 ENCOUNTER — Encounter: Payer: Self-pay | Admitting: Internal Medicine

## 2021-08-27 VITALS — BP 138/60 | HR 84 | Temp 98.5°F | Resp 16 | Ht 62.0 in | Wt 131.0 lb

## 2021-08-27 DIAGNOSIS — Z794 Long term (current) use of insulin: Secondary | ICD-10-CM

## 2021-08-27 DIAGNOSIS — E039 Hypothyroidism, unspecified: Secondary | ICD-10-CM

## 2021-08-27 DIAGNOSIS — N1832 Chronic kidney disease, stage 3b: Secondary | ICD-10-CM

## 2021-08-27 DIAGNOSIS — I10 Essential (primary) hypertension: Secondary | ICD-10-CM

## 2021-08-27 DIAGNOSIS — E118 Type 2 diabetes mellitus with unspecified complications: Secondary | ICD-10-CM

## 2021-08-27 DIAGNOSIS — E119 Type 2 diabetes mellitus without complications: Secondary | ICD-10-CM | POA: Diagnosis not present

## 2021-08-27 LAB — TSH: TSH: 0.25 u[IU]/mL — ABNORMAL LOW (ref 0.35–5.50)

## 2021-08-27 MED ORDER — DEXCOM G6 SENSOR MISC: 1.0000 | Freq: Every day | 5 refills | Status: DC

## 2021-08-27 MED ORDER — DEXCOM G6 TRANSMITTER MISC: 1.0000 | Freq: Every day | 5 refills | Status: DC

## 2021-08-27 MED ORDER — TOUJEO SOLOSTAR 300 UNIT/ML ~~LOC~~ SOPN: 60.0000 [IU] | PEN_INJECTOR | Freq: Every day | SUBCUTANEOUS | 1 refills | Status: DC

## 2021-08-27 MED ORDER — DEXCOM G6 RECEIVER DEVI: 1.0000 | Freq: Every day | 5 refills | Status: DC

## 2021-08-27 MED ORDER — LEVOTHYROXINE SODIUM 88 MCG PO TABS: 88.0000 ug | ORAL_TABLET | Freq: Every day | ORAL | 0 refills | Status: DC

## 2021-08-27 NOTE — Progress Notes (Signed)
Subjective:  Patient ID: Sonia Skinner, female    DOB: [birth year]  Age: 71 y.o. MRN: [id number]  CC: Hypertension  This visit occurred during the SARS-CoV-2 public health emergency.  Safety protocols were in place, including screening questions prior to the visit, additional usage of staff PPE, and extensive cleaning of exam room while observing appropriate contact time as indicated for disinfecting solutions.    HPI Sonia Skinner presents for a f/up -   She was recently admitted for a TIA.  She has no residual neurological symptoms.  She is concerned that her TSH was low.  She is active and denies chest pain, shortness of breath, or diaphoresis.  PCP: [name], MD   Admit date: 08/13/2021  Discharge date: 08/15/2021   Admitted From: Home   Disposition:  Home    Recommendations for Outpatient Follow-up:    Follow up with PCP in 1-2 weeks Follow-up with neurologist as planned.   Home Health: N/A Equipment/Devices: N/A Consultations: Neurology Discharge Condition: Improved CODE STATUS: Full code Diet Recommendation: Heart Healthy carb modified   Diet Order                  Diet - low sodium heart healthy             Diet Carb Modified             Diet heart healthy/carb modified Room service appropriate? Yes; Fluid consistency: Thin  Diet effective ____                            Chief Complaint  Patient presents with   Numbness   Code Stroke      Brief history of present illness from the day of admission and additional interim summary     Sonia Skinner is an 71 y.o. female with a PMHx of stroke in 2015 with residual left sided weakness, anxiety, left carotid artery occlusion, prior left CEA, DM, HTN, prior cervical spine anterior fixation with posterior microdiscectomy and thyroid disease who presents to the ED with a c/c of acute onset of sensory "dullness" on her entire left side. LKN was at 1700, which is when symptoms started. She denies new weakness. She  does endorse old left sided facial weakness from her prior stroke, but denies chronic left upper or lower extremity weakness. However, as she was examined and her mild left upper extremity weakness was evident, she then did endorse some mild left upper extremity weakness as a chronic sequela of her prior stroke.   Outpatient Medications Prior to Visit  Medication Sig Dispense Refill   Accu-Chek FastClix Lancets MISC Use to check blood sugar five times daily. 510 each 2   acetaminophen (TYLENOL) 500 MG tablet Take 1,000-1,500 mg by mouth every 6 (six) hours as needed for mild pain.     amLODipine (NORVASC) 5 MG tablet Take 1 tablet (5 mg total) by mouth daily. 90 tablet 3   [START ON 09/05/2021] aspirin EC 325 MG tablet Take 1 tablet (325 mg total) by mouth daily. 100 tablet 3   atorvastatin (LIPITOR) 40 MG tablet Take 1 tablet (40 mg total) by mouth daily. 90 tablet 1   blood glucose meter kit and supplies KIT Use to test blood sugars 1 each 0   Cholecalciferol (VITAMIN D3 PO) Take 1 tablet by mouth daily.     clopidogrel (PLAVIX) 75 MG tablet Take 1 tablet (75 mg  total) by mouth daily for 21 days. 21 tablet 0   dexlansoprazole (DEXILANT) 60 MG capsule Take 1 capsule (60 mg total) by mouth daily. 90 capsule 0   EDARBYCLOR 40-12.5 MG TABS Take 1 tablet by mouth daily. 90 tablet 0   gabapentin (NEURONTIN) 100 MG capsule Take 1 capsule by mouth twice daily.  180 capsule 0   glucose blood (ACCU-CHEK GUIDE) test strip Use to check blood sugars five times daily. 500 each 2   Insulin Pen Needle 32G X 4 MM MISC Use to administer insulin four times a day 360 each 3   loratadine (CLARITIN) 10 MG tablet Take 1 tablet (10 mg total) by mouth daily. 90 tablet 0   Acetaminophen-Codeine (TYLENOL/CODEINE #3) 300-30 MG tablet Take 1 tablet by mouth every 6 (six) hours as needed for pain. 12 tablet 0   amitriptyline (ELAVIL) 100 MG tablet Take 1 tablet (100 mg total) by mouth at bedtime. 90 tablet 0   aspirin EC 81  MG tablet Take 1 tablet (81 mg total) by mouth daily for 21 days. Swallow whole. 21 tablet 0   buPROPion (WELLBUTRIN XL) 300 MG 24 hr tablet Take 1 tablet (300 mg total) by mouth daily. 90 tablet 0   Continuous Blood Gluc Receiver (FREESTYLE LIBRE 2 READER) DEVI 1 Act by Does not apply route daily. 2 each 5   Continuous Blood Gluc Sensor (FREESTYLE LIBRE 2 SENSOR) MISC 1 Act by Does not apply route daily. 2 each 5   dapagliflozin propanediol (FARXIGA) 10 MG TABS tablet Take 1 tablet (10 mg total) by mouth daily before breakfast. 90 tablet 1   levothyroxine (SYNTHROID) 112 MCG tablet Take 1 tablet by mouth daily before breakfast. 90 tablet 0   OZEMPIC, 1 MG/DOSE, 4 MG/3ML SOPN Inject 4 mg into the skin once a week.     Semaglutide, 2 MG/DOSE, 8 MG/3ML SOPN Inject 2 mg as directed once a week. 9 mL 1   TOUJEO SOLOSTAR 300 UNIT/ML Solostar Pen Inject 50 units under the skin every morning. 9 mL 0   No facility-administered medications prior to visit.    ROS Review of Systems  Constitutional:  Negative for appetite change, diaphoresis, fatigue and unexpected weight change.  HENT: Negative.    Eyes: Negative.   Respiratory:  Negative for cough, chest tightness, shortness of breath and wheezing.   Cardiovascular:  Negative for chest pain, palpitations and leg swelling.  Gastrointestinal:  Negative for abdominal pain and diarrhea.  Endocrine: Negative.  Negative for cold intolerance and heat intolerance.  Genitourinary: Negative.  Negative for difficulty urinating.  Musculoskeletal: Negative.  Negative for myalgias.  Skin: Negative.   Neurological:  Negative for dizziness, weakness, light-headedness and numbness.  Hematological:  Negative for adenopathy. Does not bruise/bleed easily.  Psychiatric/Behavioral: Negative.     Objective:  BP 138/60 (BP Location: Right Arm, Patient Position: Sitting, Cuff Size: Large)    Pulse 84    Temp 98.5 F (36.9 C) (Oral)    Resp 16    Ht _0  (1.575 m)    Wt  131 lb (59.4 kg)    SpO2 97%    BMI 23.96 kg/m   BP Readings from Last 3 Encounters:  08/27/21 138/60  08/26/21 (!) 142/70  08/21/21 (!) 147/60    Wt Readings from Last 3 Encounters:  08/27/21 131 lb (59.4 kg)  08/26/21 130 lb 6.4 oz (59.1 kg)  08/21/21 130 lb (59 kg)    Physical Exam Vitals reviewed.  HENT:  Nose: Nose normal.     Mouth/Throat:     Mouth: Mucous membranes are moist.  Eyes:     General: No scleral icterus.    Conjunctiva/sclera: Conjunctivae normal.  Cardiovascular:     Rate and Rhythm: Normal rate and regular rhythm.     Heart sounds: No murmur heard. Pulmonary:     Effort: Pulmonary effort is normal.     Breath sounds: No stridor. No wheezing, rhonchi or rales.  Abdominal:     General: Abdomen is flat.     Palpations: There is no mass.     Tenderness: There is no abdominal tenderness.  Musculoskeletal:     Cervical back: Neck supple.  Lymphadenopathy:     Cervical: No cervical adenopathy.  Skin:    General: Skin is warm and dry.  Neurological:     General: No focal deficit present.     Mental Status: She is alert. Mental status is at baseline.  Psychiatric:        Mood and Affect: Mood normal.    Lab Results  Component Value Date   WBC 12.2 (H) 08/13/2021   HGB 12.2 08/13/2021   HCT 36.0 08/13/2021   PLT 242 08/13/2021   GLUCOSE 210 (H) 08/13/2021   CHOL 196 08/15/2021   TRIG 234 (H) 08/15/2021   HDL 40 (L) 08/15/2021   LDLDIRECT 141 (H) 03/04/2010   LDLCALC 109 (H) 08/15/2021   ALT 14 08/13/2021   AST 13 (L) 08/13/2021   NA 141 08/13/2021   K 3.9 08/13/2021   CL 106 08/13/2021   CREATININE 1.80 (H) 08/13/2021   BUN 22 08/13/2021   CO2 22 08/13/2021   TSH 0.25 (L) 08/27/2021   INR 1.1 08/13/2021   HGBA1C 8.5 (H) 08/15/2021   MICROALBUR <0.7 04/07/2021    MR ANGIO HEAD WO CONTRAST  Result Date: 08/14/2021 CLINICAL DATA:  Stroke. EXAM: MRA HEAD WITHOUT CONTRAST TECHNIQUE: Angiographic images of the Circle of Willis were  acquired using MRA technique without intravenous contrast. COMPARISON:  Brain MRI from earlier today.  CTA 11/03/2013 FINDINGS: Anterior circulation: Atheromatous irregularity of the carotid siphons with over 50% stenosis the bilateral cavernous segment. No downstream branch occlusion, beading, or flow limiting stenosis. Negative for aneurysm. Posterior circulation: Vertebral and basilar arteries are smoothly contoured and widely patent. No branch occlusion, beading, or aneurysm Anatomic variants: None significant IMPRESSION: No emergent finding. Atheromatous irregularity of the bilateral cavernous carotids with at least 50% stenosis, appearance stable from 2015. Electronically Signed   By: Jorje Guild M.D.   On: 08/14/2021 10:36   MR BRAIN WO CONTRAST  Result Date: 08/14/2021 CLINICAL DATA:  Acute neuro deficit with stroke suspected. Decreased sensation on the left side. EXAM: MRI HEAD WITHOUT CONTRAST TECHNIQUE: Multiplanar, multiecho pulse sequences of the brain and surrounding structures were obtained without intravenous contrast. COMPARISON:  04/09/2020 FINDINGS: Brain: No acute infarction, hemorrhage, hydrocephalus, extra-axial collection or mass lesion. Small to moderate remote cortically based infarct at the left frontal parietal junction. Age normal brain volume. No hemorrhage, hydrocephalus, or masslike finding Vascular: Normal flow voids Skull and upper cervical spine: Normal marrow signal. C3-4 disc space narrowing and ridging. Sinuses/Orbits: Negative IMPRESSION: 1. No acute or reversible finding. 2. Remote left frontoparietal cortex infarct. Electronically Signed   By: Jorje Guild M.D.   On: 08/14/2021 04:58   ECHOCARDIOGRAM COMPLETE  Result Date: 08/14/2021    ECHOCARDIOGRAM REPORT   Patient Name:   LING FLESCH Date of Exam: 08/14/2021 Medical Rec #:  [id number]   Height:       62.0 in Accession #:    [id number]  Weight:       126.0 lb Date of Birth:  02/10/1951   BSA:          1.571  m Patient Age:    69 years    BP:           151/54 mmHg Patient Gender: F           HR:           83 bpm. Exam Location:  Inpatient Procedure: 2D Echo, Cardiac Doppler and Color Doppler Indications:    Stroke  History:        Patient has prior history of Echocardiogram examinations, most                 recent 12/13/2016. Risk Factors:Hypertension and Diabetes.  Sonographer:    Glo Herring Referring Phys: [id number] [place]  1. Left ventricular ejection fraction, by estimation, is 70 to 75%. The left ventricle has hyperdynamic function. The left ventricle has no regional wall motion abnormalities. Left ventricular diastolic parameters are consistent with Grade I diastolic dysfunction (impaired relaxation).  2. Right ventricular systolic function is normal. The right ventricular size is normal. Tricuspid regurgitation signal is inadequate for assessing PA pressure.  3. The mitral valve is degenerative. Trivial mitral valve regurgitation. No evidence of mitral stenosis.  4. The aortic valve is tricuspid. Aortic valve regurgitation is not visualized. No aortic stenosis is present.  5. The inferior vena cava is normal in size with greater than 50% respiratory variability, suggesting right atrial pressure of 3 mmHg. Conclusion(s)/Recommendation(s): No intracardiac source of embolism detected on this transthoracic study. Consider a transesophageal echocardiogram to exclude cardiac source of embolism if clinically indicated. FINDINGS  Left Ventricle: Left ventricular ejection fraction, by estimation, is 70 to 75%. The left ventricle has hyperdynamic function. The left ventricle has no regional wall motion abnormalities. The left ventricular internal cavity size was normal in size. There is no left ventricular hypertrophy. Left ventricular diastolic parameters are consistent with Grade I diastolic dysfunction (impaired relaxation). Right Ventricle: The right ventricular size is normal. No increase in right  ventricular wall thickness. Right ventricular systolic function is normal. Tricuspid regurgitation signal is inadequate for assessing PA pressure. Left Atrium: Left atrial size was normal in size. Right Atrium: Right atrial size was normal in size. Pericardium: There is no evidence of pericardial effusion. Mitral Valve: The mitral valve is degenerative in appearance. There is mild calcification of the anterior and posterior mitral valve leaflet(s). Trivial mitral valve regurgitation. No evidence of mitral valve stenosis. MV peak gradient, 8.6 mmHg. The mean mitral valve gradient is 3.0 mmHg. Tricuspid Valve: The tricuspid valve is grossly normal. Tricuspid valve regurgitation is not demonstrated. No evidence of tricuspid stenosis. Aortic Valve: The aortic valve is tricuspid. Aortic valve regurgitation is not visualized. No aortic stenosis is present. Aortic valve mean gradient measures 4.0 mmHg. Aortic valve peak gradient measures 7.7 mmHg. Pulmonic Valve: The pulmonic valve was grossly normal. Pulmonic valve regurgitation is not visualized. No evidence of pulmonic stenosis. Aorta: The aortic root and ascending aorta are structurally normal, with no evidence of dilitation. Venous: The inferior vena cava is normal in size with greater than 50% respiratory variability, suggesting right atrial pressure of 3 mmHg. IAS/Shunts: The atrial septum is grossly normal.  LEFT VENTRICLE PLAX 2D LVIDd:         3.30 cm Diastology  LVIDs:         2.20 cm LV e' medial:    6.85 cm/s LV PW:         1.00 cm LV E/e' medial:  15.0 LV IVS:        0.90 cm LV e' lateral:   9.57 cm/s                        LV E/e' lateral: 10.8  RIGHT VENTRICLE             IVC RV S prime:     10.30 cm/s  IVC diam: 1.60 cm LEFT ATRIUM           Index LA diam:      3.70 cm 2.36 cm/m LA Vol (A4C): 33.2 ml 21.13 ml/m  AORTIC VALVE                   PULMONIC VALVE AV Vmax:           138.50 cm/s PV Vmax:       1.06 m/s AV Vmean:          96.900 cm/s PV Peak grad:   4.5 mmHg AV VTI:            0.258 m AV Peak Grad:      7.7 mmHg AV Mean Grad:      4.0 mmHg LVOT Vmax:         133.00 cm/s LVOT Vmean:        94.400 cm/s LVOT VTI:          0.242 m LVOT/AV VTI ratio: 0.94  AORTA Ao Root diam: 2.50 cm Ao Asc diam:  2.10 cm MITRAL VALVE MV Area (PHT): 2.84 cm     SHUNTS MV Peak grad:  8.6 mmHg     Systemic VTI: 0.24 m MV Mean grad:  3.0 mmHg MV Vmax:       1.47 m/s MV Vmean:      80.2 cm/s MV Decel Time: 267 msec MV E velocity: 103.00 cm/s MV A velocity: 140.00 cm/s MV E/A ratio:  0.74 Eleonore Chiquito MD Electronically signed by Eleonore Chiquito MD Signature Date/Time: 08/14/2021/1:49:52 PM    Final    CT HEAD CODE STROKE WO CONTRAST  Result Date: 08/13/2021 CLINICAL DATA:  Code stroke. Acute neuro deficit. Left-sided weakness. EXAM: CT HEAD WITHOUT CONTRAST TECHNIQUE: Contiguous axial images were obtained from the base of the skull through the vertex without intravenous contrast. COMPARISON:  CT head 04/09/2020 FINDINGS: Brain: Mild cerebral volume loss, typical for age. Ventricle size normal. Chronic infarct left high parietal lobe unchanged. Negative for acute infarct, acute hemorrhage, or mass lesion. Vascular: Negative for hyperdense vessel Skull: Negative Sinuses/Orbits: Mild mucosal edema paranasal sinuses. Bilateral cataract extraction Other: None ASPECTS ([place] Stroke Program Early CT Score) - Ganglionic level infarction (caudate, lentiform nuclei, internal capsule, insula, M1-M3 cortex): 7 - Supraganglionic infarction (M4-M6 cortex): 3 Total score (0-10 with 10 being normal): 10 IMPRESSION: 1. No acute abnormality 2. ASPECTS is 10 3. Code stroke imaging results were communicated on 08/13/2021 at 8:01 pm to provider [place] via text page Electronically Signed   By: Franchot Gallo M.D.   On: 08/13/2021 20:09    Assessment & Plan:   Estelle was seen today for hypertension.  Diagnoses and all orders for this visit:  Essential hypertension, benign- Her blood pressure is  adequately well controlled.  Stage 3b chronic kidney disease ([place])- Will address  risk factor modifications.. -     dapagliflozin propanediol (FARXIGA) 10 MG TABS tablet; Take 1 tablet (10 mg total) by mouth daily before breakfast.  Acquired hypothyroidism- Her TSH is suppressed.  Will lower her T4 dose. -     TSH; Future -     TSH -     levothyroxine (SYNTHROID) 88 MCG tablet; Take 1 tablet (88 mcg total) by mouth daily.  Type 2 diabetes mellitus with complication, with long-term current use of insulin (HCC) -     Semaglutide, 2 MG/DOSE, 8 MG/3ML SOPN; Inject 2 mg as directed once a week. -     dapagliflozin propanediol (FARXIGA) 10 MG TABS tablet; Take 1 tablet (10 mg total) by mouth daily before breakfast.  Type II diabetes mellitus with manifestations ([place])  Insulin-requiring or dependent type II diabetes mellitus ([name])- Her A1c remains too high.  Will increase the dose of the basal insulin. -     insulin glargine, 1 Unit Dial, (TOUJEO SOLOSTAR) 300 UNIT/ML Solostar Pen; Inject 60 Units into the skin daily. -     Continuous Blood Gluc Receiver ([place]) DEVI; 1 Act by Does not apply route daily. -     Continuous Blood Gluc Transmit (DEXCOM G6 TRANSMITTER) MISC; 1 Act by Does not apply route daily. -     Continuous Blood Gluc Sensor (DEXCOM G6 SENSOR) MISC; 1 Act by Does not apply route daily. -     Semaglutide, 2 MG/DOSE, 8 MG/3ML SOPN; Inject 2 mg as directed once a week.   I have discontinued Karen Chafe. [employer] FreeStyle Libre 2 Reader, [employer] 2 Sensor, levothyroxine, and Acetaminophen-Codeine. I have also changed her [employer]. Additionally, I am having her start on levothyroxine, Dexcom G6 Receiver, Dexcom G6 Transmitter, and Dexcom G6 Sensor. Lastly, I am having her maintain her blood glucose meter kit and supplies, gabapentin, Insulin Pen Needle, atorvastatin, Accu-Chek Guide, Accu-Chek FastClix Lancets, loratadine, Edarbyclor, acetaminophen, Cholecalciferol  (VITAMIN D3 PO), clopidogrel, aspirin EC, dexlansoprazole, amLODipine, Semaglutide (2 MG/DOSE), and dapagliflozin propanediol.  Meds ordered this encounter  Medications   insulin glargine, 1 Unit Dial, (TOUJEO SOLOSTAR) 300 UNIT/ML Solostar Pen    Sig: Inject 60 Units into the skin daily.    Dispense:  18 mL    Refill:  1   levothyroxine (SYNTHROID) 88 MCG tablet    Sig: Take 1 tablet (88 mcg total) by mouth daily.    Dispense:  90 tablet    Refill:  0   Continuous Blood Gluc Receiver (DEXCOM G6 RECEIVER) DEVI    Sig: 1 Act by Does not apply route daily.    Dispense:  1 each    Refill:  5   Continuous Blood Gluc Transmit (DEXCOM G6 TRANSMITTER) MISC    Sig: 1 Act by Does not apply route daily.    Dispense:  1 each    Refill:  5   Continuous Blood Gluc Sensor (DEXCOM G6 SENSOR) MISC    Sig: 1 Act by Does not apply route daily.    Dispense:  1 each    Refill:  5   Semaglutide, 2 MG/DOSE, 8 MG/3ML SOPN    Sig: Inject 2 mg as directed once a week.    Dispense:  9 mL    Refill:  1   dapagliflozin propanediol (FARXIGA) 10 MG TABS tablet    Sig: Take 1 tablet (10 mg total) by mouth daily before breakfast.    Dispense:  90 tablet    Refill:  1     Follow-up: Return in about 3 months (around 11/25/2021).  Scarlette Calico, MD

## 2021-08-27 NOTE — Patient Instructions (Signed)

## 2021-08-28 ENCOUNTER — Encounter (HOSPITAL_COMMUNITY): Payer: Self-pay | Admitting: Psychiatry

## 2021-08-28 ENCOUNTER — Telehealth (HOSPITAL_BASED_OUTPATIENT_CLINIC_OR_DEPARTMENT_OTHER): Payer: 59 | Admitting: Psychiatry

## 2021-08-28 ENCOUNTER — Other Ambulatory Visit: Payer: Self-pay

## 2021-08-28 DIAGNOSIS — F411 Generalized anxiety disorder: Secondary | ICD-10-CM

## 2021-08-28 DIAGNOSIS — F331 Major depressive disorder, recurrent, moderate: Secondary | ICD-10-CM

## 2021-08-28 MED ORDER — BUPROPION HCL ER (XL) 300 MG PO TB24: 300.0000 mg | ORAL_TABLET | Freq: Every day | ORAL | 0 refills | Status: DC

## 2021-08-28 MED ORDER — DAPAGLIFLOZIN PROPANEDIOL 10 MG PO TABS: 10.0000 mg | ORAL_TABLET | Freq: Every day | ORAL | 1 refills | Status: DC

## 2021-08-28 MED ORDER — AMITRIPTYLINE HCL 100 MG PO TABS: 100.0000 mg | ORAL_TABLET | Freq: Every day | ORAL | 0 refills | Status: DC

## 2021-08-28 MED ORDER — SEMAGLUTIDE (2 MG/DOSE) 8 MG/3ML ~~LOC~~ SOPN: 2.0000 mg | PEN_INJECTOR | SUBCUTANEOUS | 1 refills | Status: DC

## 2021-08-28 NOTE — Progress Notes (Signed)
Virtual Visit via Telephone Note  I connected with Sonia Skinner on 08/28/21 at  8:40 AM EST by telephone and verified that I am speaking with the correct person using two identifiers.  Location: Patient: Home Provider: Home Office   I discussed the limitations, risks, security and privacy concerns of performing an evaluation and management service by telephone and the availability of in person appointments. I also discussed with the patient that there may be a patient responsible charge related to this service. The patient expressed understanding and agreed to proceed.   History of Present Illness: Patient is evaluated by phone session.  She was doing fine and had a good Christmas but after the Christmas she was admitted on the medical floor for TIA.  She recently discharged and still struggle with dullness, generalized weakness, feeling foggy and struggling with attention and concentration.  He has a visit with her PCP and now had a blood work to check her thyroid.  Her hemoglobin A1c was last 8.5 which is increased from 7.2.  Patient admitted sometime feel disappointed, dysphoric mood and worried about her general health.  She noticed left-sided weakness has increased from the past.  Patient was given Plavix for 21 days and she had appointment to see a cardiologist and neurologist.  Patient is not back to work but scheduled to go back to work on coming Tuesday.  Patient is a Oceanographer at school.  She denies any paranoia, hallucination, suicidal thoughts.  She has support from her sister who lives at home and work from home with her.  Patient reported her appetite is okay.  She is still struggles sometimes with memory which is chronic and is stable.  She is compliant with Wellbutrin and amitriptyline.  She sleeps okay.  She reported recent hospitalization had increased her anxiety and she struggles with attention and concentration.  Patient has a history of stroke in 2015 with residual weakness  on left side.     Past Psychiatric History: Reviewed. H/O taking antidepressant on and off most of life. H/O overdose and inpatient at [place] provider at [facility] and given the Strattera, Adderall, Zoloft, Celexa, Lamictal and Lexapro.  Never tested for ADHD. No h/o psychosis, mania or hallucination.  In 2001 she moved to [facility] and saw psychiatrist and given amitriptyline and Wellbutrin to stop smoking.   Psychiatric Specialty Exam: Physical Exam  Review of Systems  Weight 130 lb (59 kg).There is no height or weight on file to calculate BMI.  General Appearance: NA  Eye Contact:  NA  Speech:  Slow  Volume:  Decreased  Mood:  Dysphoric  Affect:  NA  Thought Process:  Goal Directed  Orientation:  Full (Time, Place, and Person)  Thought Content:  Rumination  Suicidal Thoughts:  No  Homicidal Thoughts:  No  Memory:  Immediate;   Good Recent;   Good Remote;   Fair  Judgement:  Intact  Insight:  Present  Psychomotor Activity:  NA  Concentration:  Concentration: Fair and Attention Span: Fair  Recall:  Good  Fund of Knowledge:  Good  Language:  Good  Akathisia:  No  Handed:  Right  AIMS (if indicated):     Assets:  Communication Skills Desire for Improvement Housing Social Support Transportation  ADL's:  Intact  Cognition:  WNL  Sleep:   ok      Assessment and Plan: Major depressive disorder, recurrent.  Generalized anxiety disorder.  I reviewed recent hospitalization on medical floor,  blood work and medication.  Discussed her lack of focus attention and feeling dull could be status post TIA and it will take some time.  Patient like to change the medication however I recommended she should stay with the current medication and to see if her condition improved slowly and gradually back to previous level.  I also encouraged keep the appointment with cardiologist and neurologist.  Patient agreed with the plan.  I will continue Wellbutrin XL 300 mg  daily and amitriptyline 100 mg at bedtime.  Patient agreed that she was doing fine before the recent hospitalization.  She agreed to give more time to recover.  I recommended to call us back if she feels worsening of the symptoms or if she has any question.  We will follow up in 3 months.  Follow Up Instructions:    I discussed the assessment and treatment plan with the patient. The patient was provided an opportunity to ask questions and all were answered. The patient agreed with the plan and demonstrated an understanding of the instructions.   The patient was advised to call back or seek an in-person evaluation if the symptoms worsen or if the condition fails to improve as anticipated.  I provided 19 minutes of non-face-to-face time during this encounter.   Kathlee Nations, MD

## 2021-09-19 ENCOUNTER — Other Ambulatory Visit: Payer: Self-pay | Admitting: Internal Medicine

## 2021-09-19 DIAGNOSIS — I10 Essential (primary) hypertension: Secondary | ICD-10-CM

## 2021-09-19 DIAGNOSIS — E118 Type 2 diabetes mellitus with unspecified complications: Secondary | ICD-10-CM

## 2021-09-19 DIAGNOSIS — N1832 Chronic kidney disease, stage 3b: Secondary | ICD-10-CM

## 2021-09-19 DIAGNOSIS — I63512 Cerebral infarction due to unspecified occlusion or stenosis of left middle cerebral artery: Secondary | ICD-10-CM

## 2021-09-19 DIAGNOSIS — E785 Hyperlipidemia, unspecified: Secondary | ICD-10-CM

## 2021-09-22 ENCOUNTER — Ambulatory Visit: Payer: Commercial Managed Care - HMO | Admitting: Internal Medicine

## 2021-09-28 ENCOUNTER — Other Ambulatory Visit: Payer: Self-pay | Admitting: Emergency Medicine

## 2021-09-28 DIAGNOSIS — M549 Dorsalgia, unspecified: Secondary | ICD-10-CM

## 2021-10-02 ENCOUNTER — Ambulatory Visit: Payer: Medicare Other | Admitting: Nurse Practitioner

## 2021-10-05 ENCOUNTER — Other Ambulatory Visit: Payer: Self-pay

## 2021-10-05 ENCOUNTER — Other Ambulatory Visit: Payer: Self-pay | Admitting: *Deleted

## 2021-10-05 ENCOUNTER — Ambulatory Visit: Payer: Commercial Managed Care - HMO | Admitting: Internal Medicine

## 2021-10-05 ENCOUNTER — Ambulatory Visit (INDEPENDENT_AMBULATORY_CARE_PROVIDER_SITE_OTHER): Payer: Medicare Other | Admitting: Internal Medicine

## 2021-10-05 VITALS — BP 126/50 | HR 110 | Temp 98.2°F | Ht 62.0 in | Wt 119.4 lb

## 2021-10-05 DIAGNOSIS — E039 Hypothyroidism, unspecified: Secondary | ICD-10-CM

## 2021-10-05 DIAGNOSIS — E559 Vitamin D deficiency, unspecified: Secondary | ICD-10-CM

## 2021-10-05 DIAGNOSIS — G8929 Other chronic pain: Secondary | ICD-10-CM

## 2021-10-05 DIAGNOSIS — M545 Low back pain, unspecified: Secondary | ICD-10-CM | POA: Insufficient documentation

## 2021-10-05 DIAGNOSIS — M5441 Lumbago with sciatica, right side: Secondary | ICD-10-CM

## 2021-10-05 DIAGNOSIS — I10 Essential (primary) hypertension: Secondary | ICD-10-CM | POA: Diagnosis not present

## 2021-10-05 DIAGNOSIS — G43011 Migraine without aura, intractable, with status migrainosus: Secondary | ICD-10-CM | POA: Diagnosis not present

## 2021-10-05 DIAGNOSIS — M5442 Lumbago with sciatica, left side: Secondary | ICD-10-CM | POA: Diagnosis not present

## 2021-10-05 MED ORDER — ACETAMINOPHEN-CODEINE 300-30 MG PO TABS
1.0000 | ORAL_TABLET | Freq: Four times a day (QID) | ORAL | 0 refills | Status: DC | PRN
Start: 2021-10-05 — End: 2021-11-26

## 2021-10-05 MED ORDER — GABAPENTIN 100 MG PO CAPS: 100.0000 mg | ORAL_CAPSULE | Freq: Three times a day (TID) | ORAL | 1 refills | Status: DC

## 2021-10-05 NOTE — Assessment & Plan Note (Signed)
Last vitamin D Lab Results  Component Value Date   VD25OH 62.10 04/07/2021   Stable, cont oral replacement

## 2021-10-05 NOTE — Patient Instructions (Addendum)
Visit Information  Thank you for taking time to visit with me today. Please don't hesitate to contact me if I can be of assistance to you before our next scheduled telephone appointment.  Following are the goals we discussed today:  Patient Goals/Self-Care Activities: Take all medications as prescribed Attend all scheduled provider appointments schedule appointment with eye doctor check blood sugar at prescribed times: once daily check feet daily for cuts, sores or redness enter blood sugar readings and medication or insulin into daily log take the blood sugar log to all doctor visits trim toenails straight across drink 6 to 8 glasses of water each day wear comfortable, well-fitting shoes check blood pressure daily write blood pressure results in a log or diary learn about high blood pressure take blood pressure log to all doctor appointments call doctor for signs and symptoms of high blood pressure take medications for blood pressure exactly as prescribed report new symptoms to your doctor Limit sodium, sugar, and carbohydrates in your diet Call your insurance [facility] to ask about a dentist in network Decrease your stress level when possible by limiting your care involvement with your husband as much as possible Arrange to have your husband's medications prepackaged and delivered Take time to relax and do something just for you each day; care for yourself  Continue to work with provider to address your back pain and use heat, cold, and relaxation to help ease pain  Patient verbalizes understanding of instructions and care plan provided today and agrees to view in [place]. Active MyChart status confirmed with patient.    Telephone follow up appointment with care management team member scheduled for: March  [name] RN, [place] [phone] [email or web address]

## 2021-10-05 NOTE — Assessment & Plan Note (Signed)
BP Readings from Last 3 Encounters:  10/05/21 (!) 126/50  08/27/21 138/60  08/26/21 (!) 142/70   Stable, pt to continue medical treatment

## 2021-10-05 NOTE — Patient Instructions (Signed)
Please take all new medication as prescribed - the gabapentin  Please continue all other medications as before, and refills have been done if requested - the tylenol #3  Please have the pharmacy call with any other refills you may need..  Please keep your appointments with your specialists as you may have planned  Please go to the LAB at the blood drawing area for the tests to be done - just the thyroid testing today  You will be contacted by phone if any changes need to be made immediately.  Otherwise, you will receive a letter about your results with an explanation, but please check with MyChart first.  Please remember to sign up for MyChart if you have not done so, as this will be important to you in the future with finding out test results, communicating by private email, and scheduling acute appointments online when needed.  Please see Dr Ronnald Ramp in April 2023 as you have planned

## 2021-10-05 NOTE — Patient Outreach (Signed)
[facility]) Care Management  10/05/2021  ROSINA Skinner Jun 05, 1951 [id number]  [facility]) Care Management RN Health Coach Note   10/05/2021 Name:  FRANCYS Skinner MRN:  [id number] DOB:  [birth year]  Summary: Patient states that she has a provider appointment today to discuss her back pain. Patient shared that she was admitted to the hospital for TIA 08/13/21. She continues to be stressed due to working full-time and caring for her husband. Patient reports that she is not taking her blood sugar or B/P routinely. Nurse discussed the importance of her caring for herself and encouraged patient to begin taking her blood sugar and B/P daily and to work toward controlling her diabetes and hypertension. Nurse provided education regarding complications for uncontrolled blood sugar and B/P. Patient explained that her concern for today was pain relief, she plans to stop working full-time in March, and that she has begun to decrease the amount of time she spends caring for her husband. Her goal is to start to take better care of herself for her health and wellness.   Recommendations/Changes made from today's visit:  Decrease your stress level when possible by limiting your care involvement with your husband as much as possible Arrange to have your husband's medications prepackaged and delivered Take time to relax and do something just for you each day; care for yourself  Continue to work with provider to address your back pain and use heat, cold, and relaxation to help ease pain Monitor your blood sugar and B/P daily and record the value  Subjective: Sonia Skinner is an 71 y.o. year old female who is a primary patient of [name], MD. The care management team was consulted for assistance with care management and/or care coordination needs.    RN Health Coach completed Telephone Visit today.   Objective:  Medications Reviewed Today     Reviewed by Michiel Cowboy, RN  (Registered Nurse) on 10/05/21 at 1156  Med List Status: <None>   Medication Order Taking? Sig Documenting Provider Last Dose Status Informant  Accu-Chek FastClix Lancets MISC [id number] Yes Use to check blood sugar five times daily. [name], MD Taking Active Self  acetaminophen (TYLENOL) 500 MG tablet [id number] Yes Take 1,000-1,500 mg by mouth every 6 (six) hours as needed for mild pain. [provider] Taking Active Self  amitriptyline (ELAVIL) 100 MG tablet [id number] Yes Take 1 tablet (100 mg total) by mouth at bedtime. Kathlee Nations, MD Taking Active   amLODipine (NORVASC) 5 MG tablet [id number] Yes Take 1 tablet (5 mg total) by mouth daily. Freada Bergeron, MD Taking Active   aspirin EC 325 MG tablet [id number] Yes Take 1 tablet (325 mg total) by mouth daily. Vashti Hey, MD Taking Active   atorvastatin (LIPITOR) 40 MG tablet [id number] Yes Take 1 tablet by mouth daily. [name], MD Taking Active   blood glucose meter kit and supplies KIT [id number] Yes Use to test blood sugars [name], MD Taking Active Self  buPROPion (WELLBUTRIN XL) 300 MG 24 hr tablet [id number] Yes Take 1 tablet (300 mg total) by mouth daily. Kathlee Nations, MD Taking Active   Cholecalciferol (VITAMIN D3 PO) [id number] Yes Take 1 tablet by mouth daily. [provider] Taking Active Self  Continuous Blood Gluc Receiver (DEXCOM G6 RECEIVER) DEVI [id number] No 1 Act by Does not apply route daily.  Patient not taking: Reported on 10/05/2021   [name], MD  Not Taking Active            Med Note Laretta Alstrom, Tomasz Steeves A   Mon Oct 05, 2021 11:55 AM) Has not started   Continuous Blood Gluc Sensor (DEXCOM G6 SENSOR) MISC [id number] No 1 Act by Does not apply route daily.  Patient not taking: Reported on 10/05/2021   [name], MD Not Taking Active            Med Note Laretta Alstrom, Carlyn Lemke A   Mon Oct 05, 2021 11:56 AM) Has not started   Continuous Blood Gluc Transmit (DEXCOM G6  TRANSMITTER) MISC [id number] No 1 Act by Does not apply route daily.  Patient not taking: Reported on 10/05/2021   [name], MD Not Taking Active            Med Note Laretta Alstrom, Toni Demo A   Mon Oct 05, 2021 11:56 AM) Has not started   dexlansoprazole (DEXILANT) 60 MG capsule [id number] Yes Take 1 capsule (60 mg total) by mouth daily. [name], MD Taking Active   EDARBYCLOR 40-12.5 MG TABS [id number] Yes Take 1 tablet by mouth daily. [name], MD Taking Active   FARXIGA 10 MG TABS tablet [id number] Yes Take 1 tablet by mouth daily before breakfast. [name], MD Taking Active   gabapentin (NEURONTIN) 100 MG capsule [id number] Yes Take 1 capsule by mouth twice daily.  [name], MD Taking Active Self  glucose blood (ACCU-CHEK GUIDE) test strip [id number] Yes Use to check blood sugars five times daily. [name], MD Taking Active Self  insulin glargine, 1 Unit Dial, (TOUJEO SOLOSTAR) 300 UNIT/ML Solostar Pen [id number] Yes Inject 60 Units into the skin daily. [name], MD Taking Active   Insulin Pen Needle 32G X 4 MM MISC [id number] Yes Use to administer insulin four times a day [name], MD Taking Active Self  levothyroxine (SYNTHROID) 112 MCG tablet [id number] Yes Take 1 tablet by mouth daily before breakfast. [name], MD Taking Active   levothyroxine (SYNTHROID) 88 MCG tablet [id number] Yes Take 1 tablet (88 mcg total) by mouth daily. [name], MD Taking Active   loratadine (CLARITIN) 10 MG tablet [id number] Yes Take 1 tablet (10 mg total) by mouth daily. [name], MD Taking Active Self  Semaglutide, 2 MG/DOSE, 8 MG/3ML SOPN [id number] Yes Inject 2 mg as directed once a week. [name], MD Taking Active              SDOH:  (Social Determinants of Health) assessments and interventions performed:  SDOH Interventions    Flowsheet Row Most Recent Value  SDOH Interventions   Stress Interventions --  [followed by Dr. Adele Schilder,   previously sent relaxation/meditation resources]       Care Plan  Review of patient past medical history, allergies, medications, health status, including review of consultants reports, laboratory and other test data, was performed as part of comprehensive evaluation for care management services.   Care Plan : RN Care Manager Plan of Care  Updates made by Michiel Cowboy, RN since 10/05/2021 12:00 AM     Problem: Knowledge Deficit Related to Diabetes and Hypertension   Priority: High     Long-Range Goal: Development of Plan of Care for Diabetes and Hypertension   Start Date: 08/04/2021  Expected End Date: 08/14/2022  Priority: High  Note:   Current Barriers:  Knowledge Deficits related to plan of care for management of HTN and DMII  Emotional barriers with stress and depression; last visit with Dr. Adele Schilder 08/28/21  RNCM Clinical Goal(s):  Patient will take all medications exactly as prescribed and will call provider for medication related questions as evidenced by Patient's verbalization that she takes her prescribed medications as written and contacts her providers as needed demonstrate Improved adherence to prescribed treatment plan for HTN and DMII as evidenced by monitoring her blood sugar and B/P daily and recording the values; adheres to a low sodium and diabetic diet; maintains her A1c below 7.5 and B/P < 150/90 continue to work with RN Care Manager to address care management and care coordination needs related to  HTN and DMII as evidenced by adherence to CM Team Scheduled appointments through collaboration with RN Care manager, provider, and care team.   Interventions: Inter-disciplinary care team collaboration (see longitudinal plan of care) Evaluation of current treatment plan related to  self management and patient's adherence to plan as established by provider   Diabetes Interventions:  (Status:  Goal on track:  NO.) Long Term Goal Assessed patient's understanding of A1c  goal: <7% Provided education to patient about basic DM disease process Reviewed medications with patient and discussed importance of medication adherence Advised patient, providing education and rationale, to check cbg daily and record, calling PCP for findings outside established parameters Assessed social determinant of health barriers Advised patient to limit sugar and carbohydrates in her diet Discussed that the Patient's A1c increased from 7.5 to 8.5 Encouraged patient to take her blood sugar daily and record the value Discussed that high blood sugars cause kidney damage Lab Results  Component Value Date   HGBA1C 7.2 (H) 04/07/2021   Hypertension Interventions:  (Status:  Goal on track:  NO.) Long Term Goal Last practice recorded BP readings:  BP Readings from Last 3 Encounters:  04/07/21 (!) 162/68  04/24/20 (!) 176/86  04/09/20 (!) 183/77  Most recent eGFR/CrCl: No results found for: EGFR  No components found for: CRCL  Reviewed medications with patient and discussed importance of compliance Provided assistance with obtaining home blood pressure monitor via previously mailed patient a B/P monitor; Discussed plans with patient for ongoing care management follow up and provided patient with direct contact information for care management team Advised patient, providing education and rationale, to monitor blood pressure daily and record, calling PCP for findings outside established parameters Provided education on prescribed diet low sodium Discussed complications of poorly controlled blood pressure such as heart disease, stroke, circulatory complications, vision complications, kidney impairment, sexual dysfunction Encouraged patient to decrease her stress level by decreasing her responsibilities and relaxing when possible Nurse previously sent patient hypertension and relaxation education Discussed that uncontrolled hypertension causes kidney damage   Pain Interventions:  (Status:   New goal.) Long Term Goal Pain assessment performed Medications reviewed Counseled on the importance of reporting any/all new or changed pain symptoms or management strategies to pain management provider Discussed use of relaxation techniques and/or diversional activities to assist with pain reduction (distraction, imagery, relaxation, massage, acupressure, TENS, heat, and cold application Advised patient to discuss her pain in detail today during her appointment she has with the doctor   Patient Goals/Self-Care Activities: Take all medications as prescribed Attend all scheduled provider appointments schedule appointment with eye doctor check blood sugar at prescribed times: once daily check feet daily for cuts, sores or redness enter blood sugar readings and medication or insulin into daily log take the blood sugar log to all doctor visits trim toenails straight across drink 6  to 8 glasses of water each day wear comfortable, well-fitting shoes check blood pressure daily write blood pressure results in a log or diary learn about high blood pressure take blood pressure log to all doctor appointments call doctor for signs and symptoms of high blood pressure take medications for blood pressure exactly as prescribed report new symptoms to your doctor Limit sodium, sugar, and carbohydrates in your diet Call your insurance [facility] to ask about a dentist in network Decrease your stress level when possible by limiting your care involvement with your husband as much as possible Arrange to have your husband's medications prepackaged and delivered Take time to relax and do something just for you each day; care for yourself  Continue to work with provider to address your back pain and use heat, cold, and relaxation to help ease pain  Follow Up Plan:  Telephone follow up appointment with care management team member scheduled for:  March       Plan: Telephone follow up appointment with care  management team member scheduled for:  March   Nurse will send PCP today's assessment note.   [name] RN, [place] [phone] [email or web address]

## 2021-10-05 NOTE — Progress Notes (Signed)
Patient ID: Sonia Skinner, female   DOB: June 22, 1951, 71 y.o.   MRN: [id number]        Chief Complaint: follow up low back pain, low thyroid       HPI:  Sonia Skinner is a 71 y.o. female here with c/o 1-2 wks acute on chronic LBP across the lower back but no bowel or bladder change, fever, wt loss,  worsening LE pain/numbness/weakness, gait change or falls.  Current pain about 8/10 over baseline 2-3/10. Last plain films 2013 LS spine with [place] DDD and mild DJD.  Nothing else makes better or worse except standing up or bending.  Also recent thyroid med decreased to 88 mcg per day after TSH decreased Sep 04 2021, due for f/u   Denies hyper or hypo thyroid symptoms such as voice, skin or hair change. But due for f/u lab.   Wt Readings from Last 3 Encounters:  10/05/21 119 lb 6.4 oz (54.2 kg)  08/27/21 131 lb (59.4 kg)  08/26/21 130 lb 6.4 oz (59.1 kg)   BP Readings from Last 3 Encounters:  10/05/21 (!) 126/50  08/27/21 138/60  08/26/21 (!) 142/70         Past Medical History:  Diagnosis Date   Ankle fracture    Left   Anxiety    Carotid artery occlusion    Closed fracture of left distal fibula 11/19/5033   Complication of anesthesia    ALLERY TO ESTER BASE   Depression    early 30s   Diabetes mellitus    INSULIN DEPENDENT   GERD (gastroesophageal reflux disease)    Headache    years ago   Hypertension    Pneumonia    Stroke [facility] March 2015    left MCA infarct, slight weakness on left side   Thyroid disease    Past Surgical History:  Procedure Laterality Date   ABDOMINAL HYSTERECTOMY     ANTERIOR FIXATION AND POSTERIOR MICRODISCECTOMY CERVICAL SPINE  1999   CAROTID ENDARTERECTOMY Left 11-04-13   cea   ENDARTERECTOMY Left 11/04/2013   ORIF ANKLE FRACTURE Left 09/16/2017   Procedure: OPEN REDUCTION INTERNAL FIXATION (ORIF) LEFT ANKLE FRACTURE;  Surgeon: Marchia Bond, MD;  Location: [place];  Service: Orthopedics;  Laterality: Left;    reports that she quit smoking about 7 years  ago. Her smoking use included cigarettes. She has a 20.00 pack-year smoking history. She has never used smokeless tobacco. She reports that she does not currently use alcohol after a past usage of about 1.0 - 2.0 standard drink per week. She reports that she does not use drugs. family history includes Cancer in her father; Diabetes in her mother, sister, sister, and sister; Heart disease in her mother; Hypertension in her sister. Allergies  Allergen Reactions   Anesthetics, Ester Anaphylaxis   Current Outpatient Medications on File Prior to Visit  Medication Sig Dispense Refill   Accu-Chek FastClix Lancets MISC Use to check blood sugar five times daily. 510 each 2   acetaminophen (TYLENOL) 500 MG tablet Take 1,000-1,500 mg by mouth every 6 (six) hours as needed for mild pain.     amitriptyline (ELAVIL) 100 MG tablet Take 1 tablet (100 mg total) by mouth at bedtime. 90 tablet 0   amLODipine (NORVASC) 5 MG tablet Take 1 tablet (5 mg total) by mouth daily. 90 tablet 3   aspirin EC 325 MG tablet Take 1 tablet (325 mg total) by mouth daily. 100 tablet 3   atorvastatin (LIPITOR) 40 MG  tablet Take 1 tablet by mouth daily. 90 tablet 1   blood glucose meter kit and supplies KIT Use to test blood sugars 1 each 0   buPROPion (WELLBUTRIN XL) 300 MG 24 hr tablet Take 1 tablet (300 mg total) by mouth daily. 90 tablet 0   Cholecalciferol (VITAMIN D3 PO) Take 1 tablet by mouth daily.     Continuous Blood Gluc Receiver (DEXCOM G6 RECEIVER) DEVI 1 Act by Does not apply route daily. 1 each 5   Continuous Blood Gluc Sensor (DEXCOM G6 SENSOR) MISC 1 Act by Does not apply route daily. 1 each 5   Continuous Blood Gluc Transmit (DEXCOM G6 TRANSMITTER) MISC 1 Act by Does not apply route daily. 1 each 5   dexlansoprazole (DEXILANT) 60 MG capsule Take 1 capsule (60 mg total) by mouth daily. 90 capsule 0   EDARBYCLOR 40-12.5 MG TABS Take 1 tablet by mouth daily. 90 tablet 0   FARXIGA 10 MG TABS tablet Take 1 tablet by  mouth daily before breakfast. 90 tablet 1   glucose blood (ACCU-CHEK GUIDE) test strip Use to check blood sugars five times daily. 500 each 2   insulin glargine, 1 Unit Dial, (TOUJEO SOLOSTAR) 300 UNIT/ML Solostar Pen Inject 60 Units into the skin daily. 18 mL 1   Insulin Pen Needle 32G X 4 MM MISC Use to administer insulin four times a day 360 each 3   levothyroxine (SYNTHROID) 88 MCG tablet Take 1 tablet (88 mcg total) by mouth daily. 90 tablet 0   loratadine (CLARITIN) 10 MG tablet Take 1 tablet (10 mg total) by mouth daily. 90 tablet 0   Semaglutide, 2 MG/DOSE, 8 MG/3ML SOPN Inject 2 mg as directed once a week. 9 mL 1   No current facility-administered medications on file prior to visit.        ROS:  All others reviewed and negative.  Objective        PE:  BP (!) 126/50 (BP Location: Left Arm, Patient Position: Sitting, Cuff Size: Large)    Pulse (!) 110    Temp 98.2 F (36.8 C) (Oral)    Ht _0  (1.575 m)    Wt 119 lb 6.4 oz (54.2 kg)    SpO2 96%    BMI 21.84 kg/m                 Constitutional: Pt appears in NAD               HENT: Head: NCAT.                Right Ear: External ear normal.                 Left Ear: External ear normal.                Eyes: . Pupils are equal, round, and reactive to light. Conjunctivae and EOM are normal               Nose: without d/c or deformity               Neck: Neck supple. Gross normal ROM               Cardiovascular: Normal rate and regular rhythm.                 Pulmonary/Chest: Effort normal and breath sounds without rales or wheezing.  Abd:  Soft, NT, ND, + BS, no organomegaly; mild tender bilateral lumbar paravertebral areas               Neurological: Pt is alert. At baseline orientation, motor grossly intact               Skin: Skin is warm. No rashes, no other new lesions, LE edema - none               Psychiatric: Pt behavior is normal without agitation   Micro: none  Cardiac tracings I have personally  interpreted today:  none  Pertinent Radiological findings (summarize): none   Lab Results  Component Value Date   WBC 12.2 (H) 08/13/2021   HGB 12.2 08/13/2021   HCT 36.0 08/13/2021   PLT 242 08/13/2021   GLUCOSE 210 (H) 08/13/2021   CHOL 196 08/15/2021   TRIG 234 (H) 08/15/2021   HDL 40 (L) 08/15/2021   LDLDIRECT 141 (H) 03/04/2010   LDLCALC 109 (H) 08/15/2021   ALT 14 08/13/2021   AST 13 (L) 08/13/2021   NA 141 08/13/2021   K 3.9 08/13/2021   CL 106 08/13/2021   CREATININE 1.80 (H) 08/13/2021   BUN 22 08/13/2021   CO2 22 08/13/2021   TSH 0.25 (L) 08/27/2021   INR 1.1 08/13/2021   HGBA1C 8.5 (H) 08/15/2021   MICROALBUR <0.7 04/07/2021   Assessment/Plan:  Sonia Skinner is a 71 y.o. Black or African American [2] female with  has a past medical history of Ankle fracture, Anxiety, Carotid artery occlusion, Closed fracture of left distal fibula (9/0/3009), Complication of anesthesia, Depression, Diabetes mellitus, GERD (gastroesophageal reflux disease), Headache, Hypertension, Pneumonia, Stroke [facility]) (March 2015), and Thyroid disease.  Vitamin D deficiency Last vitamin D Lab Results  Component Value Date   VD25OH 62.10 04/07/2021   Stable, cont oral replacement   Essential hypertension, benign BP Readings from Last 3 Encounters:  10/05/21 (!) 126/50  08/27/21 138/60  08/26/21 (!) 142/70   Stable, pt to continue medical treatment    Hypothyroidism Due for f/u TFts post med change, asympt, for lab testing as ordered  Intractable migraine without aura and with status migrainosus Hopefully to help prevent migraine as well  Low back pain With acute on chronic worsening without red flags, for contd tylenol #3 prn, but also add gabapentin 100 tid  Followup: Return if symptoms worsen or fail to improve.  Cathlean Cower, MD 10/11/2021 6:41 PM [facility]

## 2021-10-11 ENCOUNTER — Encounter: Payer: Self-pay | Admitting: Internal Medicine

## 2021-10-11 NOTE — Assessment & Plan Note (Signed)
Hopefully to help prevent migraine as well

## 2021-10-11 NOTE — Assessment & Plan Note (Signed)
With acute on chronic worsening without red flags, for contd tylenol #3 prn, but also add gabapentin 100 tid

## 2021-10-11 NOTE — Assessment & Plan Note (Signed)
Due for f/u TFts post med change, asympt, for lab testing as ordered

## 2021-10-28 ENCOUNTER — Telehealth (HOSPITAL_COMMUNITY): Payer: Self-pay | Admitting: Psychiatry

## 2021-11-02 ENCOUNTER — Other Ambulatory Visit: Payer: Self-pay

## 2021-11-02 ENCOUNTER — Ambulatory Visit (INDEPENDENT_AMBULATORY_CARE_PROVIDER_SITE_OTHER): Payer: Medicare Other | Admitting: Endocrinology

## 2021-11-02 ENCOUNTER — Encounter: Payer: Self-pay | Admitting: Endocrinology

## 2021-11-02 VITALS — BP 132/64 | HR 77 | Ht 62.0 in | Wt 124.8 lb

## 2021-11-02 DIAGNOSIS — Z794 Long term (current) use of insulin: Secondary | ICD-10-CM

## 2021-11-02 DIAGNOSIS — E042 Nontoxic multinodular goiter: Secondary | ICD-10-CM | POA: Diagnosis not present

## 2021-11-02 DIAGNOSIS — E118 Type 2 diabetes mellitus with unspecified complications: Secondary | ICD-10-CM

## 2021-11-02 LAB — POCT GLYCOSYLATED HEMOGLOBIN (HGB A1C): Hemoglobin A1C: 8 % — AB (ref 4.0–5.6)

## 2021-11-02 LAB — TSH: TSH: 11.69 u[IU]/mL — ABNORMAL HIGH (ref 0.35–5.50)

## 2021-11-02 LAB — T4, FREE: Free T4: 0.9 ng/dL (ref 0.60–1.60)

## 2021-11-02 MED ORDER — NOVOLIN N FLEXPEN 100 UNIT/ML ~~LOC~~ SUPN: 35.0000 [IU] | PEN_INJECTOR | SUBCUTANEOUS | 3 refills | Status: DC

## 2021-11-02 NOTE — Patient Instructions (Addendum)
I have sent a prescription to your pharmacy, to change the Basaglar to NPH, 35 units each morning, and:  ?Please continue the same other 2 diabetes medications ?Please check your blood sugar twice a day.  vary the time of day when you check, between before the 3 meals, and at bedtime.  also check if you have symptoms of your blood sugar being too high or too low.  please keep a record of the readings and bring it to your next appointment here (or you can bring the meter itself).  You can write it on any piece of paper.  please call us sooner if your blood sugar goes below 70, or if you have a lot of readings over 200.   ?Blood tests are requested for you today.  We'll let you know about the results.   ?Let's recheck the ultrasound.  you will receive a phone call, about a day and time for an appointment.   ?Please come back for a follow-up appointment in 3 months.   ?

## 2021-11-02 NOTE — Progress Notes (Signed)
? ?Subjective:  ? ? Patient ID: Sonia Skinner, female    DOB: 01-Dec-1950, 71 y.o.   MRN: [id number] ? ?HPI ?Pt returns for f/u of diabetes mellitus: ?DM type: Insulin-requiring type 2 (but she is prob is evolving type 1).   ?Dx'ed: 8588 ?Complications: PN, PAD, and CVA ?Therapy: insulin since 2000, Lantus, and [place].   ?GDM: never ?DKA: never ?Severe hypoglycemia: never.  ?Pancreatitis: never ?Other: in 2017, she had episode of nonketotic hyperosmolar hyperglycemic state; she works as a Pharmacist, hospital.  she takes qd insulin, after poor results with  multiple daily injections. She says she cannot take insulin at lunch, but she does eat lunch.   ?Interval history: she brings her meter with her cbg's which I have reviewed today.  cbg's vary from 64-207.  It is in general lowest fasting.  Pt says basaglar is just 35 units qam. ?Pt also has multinodular goiter (dx'ed 2017; left nodule bx was beth cat 1; 2020 [place] advised bx of right nodule, but pt did not respond to this advice; she takes synthroid for hypothyoidism).  pt states she feels well in general, except for fatigue.  Synthroid was recently reduced.   ?Past Medical History:  ?Diagnosis Date  ? Ankle fracture   ? Left  ? Anxiety   ? Carotid artery occlusion   ? Closed fracture of left distal fibula 09/16/2017  ? Complication of anesthesia   ? ALLERY TO ESTER BASE  ? Depression   ? early 37s  ? Diabetes mellitus   ? INSULIN DEPENDENT  ? GERD (gastroesophageal reflux disease)   ? Headache   ? years ago  ? Hypertension   ? Pneumonia   ? Stroke [facility]) March 2015  ?  left MCA infarct, slight weakness on left side  ? Thyroid disease   ? ? ?Past Surgical History:  ?Procedure Laterality Date  ? ABDOMINAL HYSTERECTOMY    ? ANTERIOR FIXATION AND POSTERIOR MICRODISCECTOMY CERVICAL SPINE  1999  ? CAROTID ENDARTERECTOMY Left 11-04-13  ? cea  ? ENDARTERECTOMY Left 11/04/2013  ? ORIF ANKLE FRACTURE Left 09/16/2017  ? Procedure: OPEN REDUCTION INTERNAL FIXATION (ORIF) LEFT ANKLE FRACTURE;   Surgeon: Marchia Bond, MD;  Location: [place];  Service: Orthopedics;  Laterality: Left;  ? ? ?Social History  ? ?Socioeconomic History  ? Marital status: Legally Separated  ?  Spouse name: Not on file  ? Number of children: 0  ? Years of education: College  ? Highest education level: Not on file  ?Occupational History  ? Occupation: [employer]  ?  Employer: [employer]  ?Tobacco Use  ? Smoking status: Former  ?  Packs/day: 1.00  ?  Years: 20.00  ?  Pack years: 20.00  ?  Types: Cigarettes  ?  Quit date: 11/01/2013  ?  Years since quitting: 8.0  ? Smokeless tobacco: Never  ?Vaping Use  ? Vaping Use: Never used  ?Substance and Sexual Activity  ? Alcohol use: Not Currently  ?  Alcohol/week: 1.0 - 2.0 standard drink  ?  Types: 1 - 2 Standard drinks or equivalent per week  ? Drug use: No  ? Sexual activity: Not Currently  ?Other Topics Concern  ? Not on file  ?Social History Narrative  ? Patient lives in 1 story home with sister.  ? Caffeine Use: 2 cups daily; sodas occasionally  ? Some college education  ? Works as Oceanographer for [employer]  ? ?Social Determinants of Health  ? ?Financial Resource Strain: Not on file  ?  Food Insecurity: No Food Insecurity  ? Worried About Charity fundraiser in the Last Year: Never true  ? Ran Out of Food in the Last Year: Never true  ?Transportation Needs: No Transportation Needs  ? Lack of Transportation (Medical): No  ? Lack of Transportation (Non-Medical): No  ?Physical Activity: Not on file  ?Stress: Stress Concern Present  ? Feeling of Stress : Very much  ?Social Connections: Not on file  ?Intimate Partner Violence: Not on file  ? ? ?Current Outpatient Medications on File Prior to Visit  ?Medication Sig Dispense Refill  ? Accu-Chek FastClix Lancets MISC Use to check blood sugar five times daily. 510 each 2  ? acetaminophen (TYLENOL) 500 MG tablet Take 1,000-1,500 mg by mouth every 6 (six) hours as needed for mild pain.    ? Acetaminophen-Codeine (TYLENOL/CODEINE #3) 300-30 MG tablet  Take 1 tablet by mouth every 6 (six) hours as needed for pain. 30 tablet 0  ? amitriptyline (ELAVIL) 100 MG tablet Take 1 tablet (100 mg total) by mouth at bedtime. 90 tablet 0  ? amLODipine (NORVASC) 5 MG tablet Take 1 tablet (5 mg total) by mouth daily. 90 tablet 3  ? aspirin EC 325 MG tablet Take 1 tablet (325 mg total) by mouth daily. 100 tablet 3  ? atorvastatin (LIPITOR) 40 MG tablet Take 1 tablet by mouth daily. 90 tablet 1  ? blood glucose meter kit and supplies KIT Use to test blood sugars 1 each 0  ? buPROPion (WELLBUTRIN XL) 300 MG 24 hr tablet Take 1 tablet (300 mg total) by mouth daily. 90 tablet 0  ? Cholecalciferol (VITAMIN D3 PO) Take 1 tablet by mouth daily.    ? Continuous Blood Gluc Receiver (DEXCOM G6 RECEIVER) DEVI 1 Act by Does not apply route daily. 1 each 5  ? Continuous Blood Gluc Sensor (DEXCOM G6 SENSOR) MISC 1 Act by Does not apply route daily. 1 each 5  ? Continuous Blood Gluc Transmit (DEXCOM G6 TRANSMITTER) MISC 1 Act by Does not apply route daily. 1 each 5  ? dexlansoprazole (DEXILANT) 60 MG capsule Take 1 capsule (60 mg total) by mouth daily. 90 capsule 0  ? EDARBYCLOR 40-12.5 MG TABS Take 1 tablet by mouth daily. 90 tablet 0  ? FARXIGA 10 MG TABS tablet Take 1 tablet by mouth daily before breakfast. 90 tablet 1  ? gabapentin (NEURONTIN) 100 MG capsule Take 1 capsule (100 mg total) by mouth 3 (three) times daily. 270 capsule 1  ? glucose blood (ACCU-CHEK GUIDE) test strip Use to check blood sugars five times daily. 500 each 2  ? Insulin Pen Needle 32G X 4 MM MISC Use to administer insulin four times a day 360 each 3  ? levothyroxine (SYNTHROID) 88 MCG tablet Take 1 tablet (88 mcg total) by mouth daily. 90 tablet 0  ? loratadine (CLARITIN) 10 MG tablet Take 1 tablet (10 mg total) by mouth daily. 90 tablet 0  ? Semaglutide, 2 MG/DOSE, 8 MG/3ML SOPN Inject 2 mg as directed once a week. 9 mL 1  ? ?No current facility-administered medications on file prior to visit.  ? ? ?Allergies   ?Allergen Reactions  ? Anesthetics, Ester Anaphylaxis  ? ? ?Family History  ?Problem Relation Age of Onset  ? Diabetes Mother   ? Heart disease Mother   ?     Before age 3  ? Cancer Father   ?     Lung  ? Hypertension Sister   ? Diabetes  Sister   ? Diabetes Sister   ? Diabetes Sister   ? ? ?BP 132/64 (BP Location: Left Arm, Patient Position: Sitting, Cuff Size: Normal)   Pulse 77   Ht _0  (1.575 m)   Wt 124 lb 12.8 oz (56.6 kg)   SpO2 92%   BMI 22.83 kg/m?  ? ? ?Review of Systems ?She has lost a few lbs.   ?   ?Objective:  ? Physical Exam ?VITAL SIGNS:  See vs page ?GENERAL: no distress ?NECK: thyroid is slightly enlarged, with irreg surface.  I cannot appreciate a nodule.   ? ? ?Lab Results  ?Component Value Date  ? HGBA1C 8.0 (A) 11/02/2021  ? ? ?   ?Assessment & Plan:  ?Insulin-requiring type 2 DM: uncontrolled.  ?Hypoglycemia, due to insulin: based on the pattern of cbg's, she needs a faster-acting QAM insulin.   ?Hypothyroidism: uncontrolled.  I told pt that based on variation of TSH, she should continue the same synthroid for now.  ? ?Patient Instructions  ?I have sent a prescription to your pharmacy, to change the Basaglar to NPH, 35 units each morning, and:  ?Please continue the same other 2 diabetes medications ?Please check your blood sugar twice a day.  vary the time of day when you check, between before the 3 meals, and at bedtime.  also check if you have symptoms of your blood sugar being too high or too low.  please keep a record of the readings and bring it to your next appointment here (or you can bring the meter itself).  You can write it on any piece of paper.  please call us sooner if your blood sugar goes below 70, or if you have a lot of readings over 200.   ?Blood tests are requested for you today.  We'll let you know about the results.   ?Let's recheck the ultrasound.  you will receive a phone call, about a day and time for an appointment.   ?Please come back for a follow-up  appointment in 3 months.   ? ? ?

## 2021-11-09 ENCOUNTER — Ambulatory Visit
Admission: RE | Admit: 2021-11-09 | Discharge: 2021-11-09 | Disposition: A | Discharge status: Home / Self Care | Payer: Medicare Other | Source: Ambulatory Visit | Attending: Endocrinology | Admitting: Endocrinology

## 2021-11-09 DIAGNOSIS — E042 Nontoxic multinodular goiter: Secondary | ICD-10-CM | POA: Diagnosis not present

## 2021-11-11 ENCOUNTER — Other Ambulatory Visit: Payer: Self-pay | Admitting: *Deleted

## 2021-11-11 NOTE — Patient Outreach (Signed)
[facility] Care Management ? ?11/11/2021 ? ?N[place]IA DELL ?[birth year] ?[id number] ? ?Unsuccessful outreach attempt made to patient. RN Health Coach left HIPAA compliant voicemail message along with her contact information. ? ?Plan: ?RN Health Coach will call patient within the month of April. ? ?[name] RN, BSN ?[facility] Care Management  ?RN Health Coach ?[phone] ?[email or web address] ? ? ?

## 2021-11-13 ENCOUNTER — Emergency Department (HOSPITAL_COMMUNITY): Payer: Medicare Other

## 2021-11-13 ENCOUNTER — Emergency Department (HOSPITAL_BASED_OUTPATIENT_CLINIC_OR_DEPARTMENT_OTHER): Payer: Medicare Other

## 2021-11-13 ENCOUNTER — Inpatient Hospital Stay (HOSPITAL_BASED_OUTPATIENT_CLINIC_OR_DEPARTMENT_OTHER)
Admission: EM | Admit: 2021-11-13 | Discharge: 2021-11-20 | DRG: 035 | Disposition: A | Discharge status: Home / Self Care | Payer: Medicare Other | Attending: Internal Medicine | Admitting: Internal Medicine

## 2021-11-13 ENCOUNTER — Other Ambulatory Visit: Payer: Self-pay

## 2021-11-13 ENCOUNTER — Encounter (HOSPITAL_BASED_OUTPATIENT_CLINIC_OR_DEPARTMENT_OTHER): Payer: Self-pay | Admitting: Emergency Medicine

## 2021-11-13 DIAGNOSIS — I10 Essential (primary) hypertension: Secondary | ICD-10-CM | POA: Diagnosis present

## 2021-11-13 DIAGNOSIS — Z7982 Long term (current) use of aspirin: Secondary | ICD-10-CM

## 2021-11-13 DIAGNOSIS — Z7984 Long term (current) use of oral hypoglycemic drugs: Secondary | ICD-10-CM | POA: Diagnosis not present

## 2021-11-13 DIAGNOSIS — R29818 Other symptoms and signs involving the nervous system: Secondary | ICD-10-CM | POA: Diagnosis not present

## 2021-11-13 DIAGNOSIS — Z833 Family history of diabetes mellitus: Secondary | ICD-10-CM | POA: Diagnosis not present

## 2021-11-13 DIAGNOSIS — H539 Unspecified visual disturbance: Secondary | ICD-10-CM | POA: Diagnosis not present

## 2021-11-13 DIAGNOSIS — I6502 Occlusion and stenosis of left vertebral artery: Secondary | ICD-10-CM | POA: Diagnosis not present

## 2021-11-13 DIAGNOSIS — I2510 Atherosclerotic heart disease of native coronary artery without angina pectoris: Secondary | ICD-10-CM | POA: Diagnosis present

## 2021-11-13 DIAGNOSIS — N1832 Chronic kidney disease, stage 3b: Secondary | ICD-10-CM | POA: Diagnosis present

## 2021-11-13 DIAGNOSIS — Z79899 Other long term (current) drug therapy: Secondary | ICD-10-CM | POA: Diagnosis not present

## 2021-11-13 DIAGNOSIS — G9389 Other specified disorders of brain: Secondary | ICD-10-CM | POA: Diagnosis not present

## 2021-11-13 DIAGNOSIS — G459 Transient cerebral ischemic attack, unspecified: Secondary | ICD-10-CM | POA: Diagnosis not present

## 2021-11-13 DIAGNOSIS — I63512 Cerebral infarction due to unspecified occlusion or stenosis of left middle cerebral artery: Secondary | ICD-10-CM

## 2021-11-13 DIAGNOSIS — E119 Type 2 diabetes mellitus without complications: Secondary | ICD-10-CM | POA: Diagnosis present

## 2021-11-13 DIAGNOSIS — F339 Major depressive disorder, recurrent, unspecified: Secondary | ICD-10-CM | POA: Diagnosis not present

## 2021-11-13 DIAGNOSIS — I6522 Occlusion and stenosis of left carotid artery: Principal | ICD-10-CM | POA: Diagnosis present

## 2021-11-13 DIAGNOSIS — Z87891 Personal history of nicotine dependence: Secondary | ICD-10-CM

## 2021-11-13 DIAGNOSIS — I69354 Hemiplegia and hemiparesis following cerebral infarction affecting left non-dominant side: Secondary | ICD-10-CM | POA: Diagnosis not present

## 2021-11-13 DIAGNOSIS — I771 Stricture of artery: Secondary | ICD-10-CM

## 2021-11-13 DIAGNOSIS — E785 Hyperlipidemia, unspecified: Secondary | ICD-10-CM | POA: Diagnosis present

## 2021-11-13 DIAGNOSIS — E039 Hypothyroidism, unspecified: Secondary | ICD-10-CM | POA: Diagnosis not present

## 2021-11-13 DIAGNOSIS — Z79890 Hormone replacement therapy (postmenopausal): Secondary | ICD-10-CM | POA: Diagnosis not present

## 2021-11-13 DIAGNOSIS — D631 Anemia in chronic kidney disease: Secondary | ICD-10-CM | POA: Diagnosis present

## 2021-11-13 DIAGNOSIS — R42 Dizziness and giddiness: Secondary | ICD-10-CM | POA: Diagnosis not present

## 2021-11-13 DIAGNOSIS — E118 Type 2 diabetes mellitus with unspecified complications: Secondary | ICD-10-CM | POA: Diagnosis present

## 2021-11-13 DIAGNOSIS — K219 Gastro-esophageal reflux disease without esophagitis: Secondary | ICD-10-CM | POA: Diagnosis present

## 2021-11-13 DIAGNOSIS — Z8249 Family history of ischemic heart disease and other diseases of the circulatory system: Secondary | ICD-10-CM | POA: Diagnosis not present

## 2021-11-13 DIAGNOSIS — R269 Unspecified abnormalities of gait and mobility: Secondary | ICD-10-CM

## 2021-11-13 DIAGNOSIS — Z884 Allergy status to anesthetic agent status: Secondary | ICD-10-CM | POA: Diagnosis not present

## 2021-11-13 DIAGNOSIS — Z801 Family history of malignant neoplasm of trachea, bronchus and lung: Secondary | ICD-10-CM | POA: Diagnosis not present

## 2021-11-13 DIAGNOSIS — K118 Other diseases of salivary glands: Secondary | ICD-10-CM | POA: Diagnosis not present

## 2021-11-13 DIAGNOSIS — Z794 Long term (current) use of insulin: Secondary | ICD-10-CM

## 2021-11-13 DIAGNOSIS — E1165 Type 2 diabetes mellitus with hyperglycemia: Secondary | ICD-10-CM | POA: Diagnosis not present

## 2021-11-13 DIAGNOSIS — E1122 Type 2 diabetes mellitus with diabetic chronic kidney disease: Secondary | ICD-10-CM | POA: Diagnosis not present

## 2021-11-13 DIAGNOSIS — I6523 Occlusion and stenosis of bilateral carotid arteries: Principal | ICD-10-CM | POA: Diagnosis present

## 2021-11-13 DIAGNOSIS — I129 Hypertensive chronic kidney disease with stage 1 through stage 4 chronic kidney disease, or unspecified chronic kidney disease: Secondary | ICD-10-CM | POA: Diagnosis not present

## 2021-11-13 DIAGNOSIS — Z95820 Peripheral vascular angioplasty status with implants and grafts: Secondary | ICD-10-CM | POA: Diagnosis not present

## 2021-11-13 LAB — URINALYSIS, ROUTINE W REFLEX MICROSCOPIC
Bilirubin Urine: NEGATIVE
Glucose, UA: >=500 mg/dL — AB
Hgb urine dipstick: NEGATIVE
Ketones, ur: NEGATIVE mg/dL
Leukocytes,Ua: NEGATIVE
Nitrite: NEGATIVE
Protein, ur: NEGATIVE mg/dL
Specific Gravity, Urine: 1.01 (ref 1.005–1.030)
pH: 6 (ref 5.0–8.0)

## 2021-11-13 LAB — BASIC METABOLIC PANEL
Anion gap: 7 (ref 5–15)
BUN: 24 mg/dL — ABNORMAL HIGH (ref 8–23)
CO2: 26 mmol/L (ref 22–32)
Calcium: 9.7 mg/dL (ref 8.9–10.3)
Chloride: 109 mmol/L (ref 98–111)
Creatinine, Ser: 2.02 mg/dL — ABNORMAL HIGH (ref 0.44–1.00)
GFR, Estimated: 26 mL/min — ABNORMAL LOW (ref >60)
Glucose, Bld: 90 mg/dL (ref 70–99)
Potassium: 4.5 mmol/L (ref 3.5–5.1)
Sodium: 142 mmol/L (ref 135–145)

## 2021-11-13 LAB — CBC WITH DIFFERENTIAL/PLATELET
Abs Immature Granulocytes: 0.04 10*3/uL (ref 0.00–0.07)
Basophils Absolute: 0.1 10*3/uL (ref 0.0–0.1)
Basophils Relative: 1 %
Eosinophils Absolute: 0.5 10*3/uL (ref 0.0–0.5)
Eosinophils Relative: 4 %
HCT: 36.2 % (ref 36.0–46.0)
Hemoglobin: 11.3 g/dL — ABNORMAL LOW (ref 12.0–15.0)
Immature Granulocytes: 0 %
Lymphocytes Relative: 24 %
Lymphs Abs: 2.5 10*3/uL (ref 0.7–4.0)
MCH: 27.5 pg (ref 26.0–34.0)
MCHC: 31.2 g/dL (ref 30.0–36.0)
MCV: 88.1 fL (ref 80.0–100.0)
Monocytes Absolute: 0.8 10*3/uL (ref 0.1–1.0)
Monocytes Relative: 8 %
Neutro Abs: 6.5 10*3/uL (ref 1.7–7.7)
Neutrophils Relative %: 63 %
Platelets: 240 10*3/uL (ref 150–400)
RBC: 4.11 MIL/uL (ref 3.87–5.11)
RDW: 13.7 % (ref 11.5–15.5)
WBC: 10.4 10*3/uL (ref 4.0–10.5)
nRBC: 0 % (ref 0.0–0.2)

## 2021-11-13 LAB — TROPONIN I (HIGH SENSITIVITY)
Troponin I (High Sensitivity): <2 ng/L (ref <18)
Troponin I (High Sensitivity): <2 ng/L (ref <18)

## 2021-11-13 LAB — URINALYSIS, MICROSCOPIC (REFLEX)

## 2021-11-13 LAB — CBG MONITORING, ED: Glucose-Capillary: 121 mg/dL — ABNORMAL HIGH (ref 70–99)

## 2021-11-13 NOTE — ED Notes (Addendum)
ED MD to attempt U/S guided IV. Roselyn Reef RN, attempted x 2 with U/S, unsuccessful ?

## 2021-11-13 NOTE — ED Notes (Signed)
Patient transported to CT 

## 2021-11-13 NOTE — ED Triage Notes (Signed)
Dizzy, blurry vision , lethargic x 24 hours , Hx stroke in past and HTN . Ambulated to triage room , no obvious neuro deficit .  ?

## 2021-11-13 NOTE — ED Notes (Signed)
Attempted IV to RAC, tol well, blood obtained for labs 

## 2021-11-13 NOTE — ED Provider Notes (Signed)
?[place] EMERGENCY DEPARTMENT ?Provider Note ? ? ?CSN: [id number] ?Arrival date & time: 11/13/21  1327 ? ?  ? ?History ? ?Chief Complaint  ?Patient presents with  ? Dizziness  ? ? ?Sonia Skinner is a 71 y.o. female. ? ?71 year old female presents with complaint of visual disturbance, feeling generally unwell and difficulty writing with her left hand, gait disturbance.  Patient states her symptoms started 2 days ago on Wednesday when she was driving down the road and states her vision went "crazy."  She describes this as double vision, unable to say if it was 1 eye or both.  Patient stopped her car at her husband's house and laid down to take a nap.  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  Patient states since that time she just feels generally unwell and fatigued.  Patient tried to call her prescription service today to refill the medication but had difficulty saying the number of the prescription also had difficulty entering the number into her phone.  Patient is concerned she has had another stroke.  Reports prior stroke which resulted in change in " darker left side of face."  Past medical history otherwise includes hypertension, thyroid disease, diabetes, carotid artery occlusion. ? ? ?  ? ?Home Medications ?Prior to Admission medications   ?Medication Sig Start Date End Date Taking? Authorizing Provider  ?Accu-Chek FastClix Lancets MISC Use to check blood sugar five times daily. 04/21/21   [name], MD  ?acetaminophen (TYLENOL) 500 MG tablet Take 1,000-1,500 mg by mouth every 6 (six) hours as needed for mild pain.    [provider]  ?Acetaminophen-Codeine (TYLENOL/CODEINE #3) 300-30 MG tablet Take 1 tablet by mouth every 6 (six) hours as needed for pain. 10/05/21   Biagio Borg, MD  ?amitriptyline (ELAVIL) 100 MG tablet Take 1 tablet (100 mg total) by mouth at bedtime. 08/28/21   Arfeen, Arlyce Harman, MD  ?amLODipine  (NORVASC) 5 MG tablet Take 1 tablet (5 mg total) by mouth daily. 08/26/21   Freada Bergeron, MD  ?aspirin EC 325 MG tablet Take 1 tablet (325 mg total) by mouth daily. 09/05/21 09/05/22  Vashti Hey, MD  ?atorvastatin (LIPITOR) 40 MG tablet Take 1 tablet by mouth daily. 09/19/21   [name], MD  ?blood glucose meter kit and supplies KIT Use to test blood sugars 04/27/19   [name], MD  ?buPROPion (WELLBUTRIN XL) 300 MG 24 hr tablet Take 1 tablet (300 mg total) by mouth daily. 08/28/21   Arfeen, Arlyce Harman, MD  ?Cholecalciferol (VITAMIN D3 PO) Take 1 tablet by mouth daily.    [provider]  ?Continuous Blood Gluc Receiver (DEXCOM G6 RECEIVER) DEVI 1 Act by Does not apply route daily. 08/27/21   [name], MD  ?Continuous Blood Gluc Sensor (DEXCOM G6 SENSOR) MISC 1 Act by Does not apply route daily. 08/27/21   [name], MD  ?Continuous Blood Gluc Transmit (DEXCOM G6 TRANSMITTER) MISC 1 Act by Does not apply route daily. 08/27/21   [name], MD  ?dexlansoprazole (DEXILANT) 60 MG capsule Take 1 capsule (60 mg total) by mouth daily. 08/20/21   [name], MD  ?EDARBYCLOR 40-12.5 MG TABS Take 1 tablet by mouth daily. 09/19/21   [name], MD  ?[place] 10 MG TABS tablet Take 1 tablet by mouth daily before breakfast. 09/19/21   [name],  MD  ?gabapentin (NEURONTIN) 100 MG capsule Take 1 capsule (100 mg total) by mouth 3 (three) times daily. 10/05/21   Biagio Borg, MD  ?glucose blood (ACCU-CHEK GUIDE) test strip Use to check blood sugars five times daily. 04/21/21   [name], MD  ?Insulin NPH, Human,, Isophane, (NOVOLIN N FLEXPEN) 100 UNIT/ML Kiwkpen Inject 35 Units into the skin every morning. And pen needles 1/day 11/02/21   Renato Shin, MD  ?Insulin Pen Needle 32G X 4 MM MISC Use to administer insulin four times a day 04/02/20   [name], MD  ?levothyroxine (SYNTHROID) 88 MCG tablet Take 1 tablet (88 mcg total) by mouth daily. 08/27/21   [name], MD  ?loratadine (CLARITIN) 10 MG tablet Take 1 tablet (10 mg total) by mouth daily. 04/21/21   [name], MD  ?Semaglutide, 2 MG/DOSE, 8 MG/3ML SOPN Inject 2 mg as directed once a week. 08/28/21   [name], MD  ?   ? ?Allergies    ?Anesthetics, ester   ? ?Review of Systems   ?Review of Systems ?Negative except as per HPI ?Physical Exam ?Updated Vital Signs ?BP (!) 171/42   Pulse (!) 114   Temp 98.7 ?F (37.1 ?C) (Oral)   Resp 18   Ht $R'5\' 2"'tC$  (1.575 m)   Wt 56.7 kg   SpO2 100%   BMI 22.86 kg/m?  ?Physical Exam ?Vitals and nursing note reviewed.  ?Constitutional:   ?   General: She is not in acute distress. ?   Appearance: She is well-developed. She is not diaphoretic.  ?HENT:  ?   Head: Normocephalic and atraumatic.  ?   Nose: Nose normal.  ?   Mouth/Throat:  ?   Mouth: Mucous membranes are moist.  ?Eyes:  ?   Extraocular Movements: Extraocular movements intact.  ?   Pupils: Pupils are equal, round, and reactive to light.  ?Cardiovascular:  ?   Rate and Rhythm: Normal rate and regular rhythm.  ?   Heart sounds: Normal heart sounds.  ?Pulmonary:  ?   Effort: Pulmonary effort is normal.  ?   Breath sounds: Normal breath sounds.  ?Abdominal:  ?   Palpations: Abdomen is soft.  ?   Tenderness: There is no abdominal tenderness.  ?Musculoskeletal:  ?   Right lower leg: No edema.  ?   Left lower leg: No edema.  ?Skin: ?   General: Skin is warm and dry.  ?   Findings: No erythema or rash.  ?Neurological:  ?   Mental Status: She is alert and oriented to person, place, and time.  ?   GCS: GCS eye subscore is 4. GCS verbal subscore is 5. GCS motor subscore is 6.  ?   Cranial Nerves: Cranial nerves 2-12 are intact.  ?   Sensory: Sensation is intact.  ?   Motor: Weakness present. No pronator drift.  ?   Coordination: Coordination normal. Finger-Nose-Finger Test and Heel to [facility] Test normal. Rapid alternating movements normal.  ?   Comments: Slightly diminished right grip compared to left as well as right  plantarflexion compared to left.  ?Psychiatric:     ?   Behavior: Behavior normal.  ? ? ?ED Results / Procedures / Treatments   ?Labs ?(all labs ordered are listed, but only abnormal results are displayed) ?Labs Reviewed  ?CBC WITH DIFFERENTIAL/PLATELET - Abnormal; Notable for the following components:  ?    Result Value  ? Hemoglobin 11.3 (*)   ?  All other components within normal limits  ?CBG MONITORING, ED - Abnormal; Notable for the following components:  ? Glucose-Capillary 121 (*)   ? All other components within normal limits  ?URINALYSIS, ROUTINE W REFLEX MICROSCOPIC  ?BASIC METABOLIC PANEL  ?TROPONIN I (HIGH SENSITIVITY)  ?TROPONIN I (HIGH SENSITIVITY)  ? ? ?EKG ?None ? ?Radiology ?DG Chest 2 View ? ?Result Date: 11/13/2021 ?CLINICAL DATA:  Dizziness with blurred vision and lethargy for 24 hours. EXAM: CHEST - 2 VIEW COMPARISON:  Radiographs 12/11/2016 and 06/12/2015. FINDINGS: The heart size and mediastinal contours are normal. The lungs are clear. There is no pleural effusion or pneumothorax. No acute osseous findings are identified. Stable degenerative changes throughout the spine. Telemetry leads overlie the chest. IMPRESSION: No evidence of active cardiopulmonary process. Electronically Signed   By: Richardean Sale M.D.   On: 11/13/2021 14:38  ? ?CT Head Wo Contrast ? ?Result Date: 11/13/2021 ?CLINICAL DATA:  Dizziness, blurred vision EXAM: CT HEAD WITHOUT CONTRAST TECHNIQUE: Contiguous axial images were obtained from the base of the skull through the vertex without intravenous contrast. RADIATION DOSE REDUCTION: This exam was performed according to the departmental dose-optimization program which includes automated exposure control, adjustment of the mA and/or kV according to patient size and/or use of iterative reconstruction technique. COMPARISON:  08/13/2021 FINDINGS: Brain: No acute intracranial findings are seen. Is old infarct in the periventricular region in the left parietal cortex with no  significant change. Cortical sulci are prominent. Vascular: Unremarkable. Skull: Unremarkable. Sinuses/Orbits: There is mucosal thickening in the ethmoid and sphenoid sinuses. Other: None IMPRESSION: No acute intracranial findin

## 2021-11-13 NOTE — ED Notes (Signed)
Report given to Deneise Lever RN CN at [facility] ED ?

## 2021-11-13 NOTE — ED Notes (Signed)
ED Provider at bedside. 

## 2021-11-13 NOTE — ED Notes (Signed)
Up to BR, gait steady, denies dizziness ?

## 2021-11-13 NOTE — ED Notes (Signed)
Report given to Michael RN with [facility] 

## 2021-11-13 NOTE — ED Notes (Addendum)
ED MD attempted U/S guided IV x 3, unsuccessful. Tol well  ?
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
## 2021-11-14 ENCOUNTER — Observation Stay (HOSPITAL_COMMUNITY): Payer: Medicare Other

## 2021-11-14 ENCOUNTER — Emergency Department (HOSPITAL_COMMUNITY): Payer: Medicare Other

## 2021-11-14 DIAGNOSIS — G459 Transient cerebral ischemic attack, unspecified: Secondary | ICD-10-CM | POA: Diagnosis not present

## 2021-11-14 DIAGNOSIS — I6522 Occlusion and stenosis of left carotid artery: Secondary | ICD-10-CM | POA: Diagnosis not present

## 2021-11-14 DIAGNOSIS — H539 Unspecified visual disturbance: Secondary | ICD-10-CM | POA: Diagnosis not present

## 2021-11-14 DIAGNOSIS — E039 Hypothyroidism, unspecified: Secondary | ICD-10-CM | POA: Diagnosis not present

## 2021-11-14 DIAGNOSIS — E785 Hyperlipidemia, unspecified: Secondary | ICD-10-CM

## 2021-11-14 DIAGNOSIS — N1832 Chronic kidney disease, stage 3b: Secondary | ICD-10-CM

## 2021-11-14 DIAGNOSIS — I10 Essential (primary) hypertension: Secondary | ICD-10-CM | POA: Diagnosis not present

## 2021-11-14 DIAGNOSIS — K118 Other diseases of salivary glands: Secondary | ICD-10-CM

## 2021-11-14 DIAGNOSIS — R269 Unspecified abnormalities of gait and mobility: Secondary | ICD-10-CM | POA: Diagnosis not present

## 2021-11-14 DIAGNOSIS — E118 Type 2 diabetes mellitus with unspecified complications: Secondary | ICD-10-CM

## 2021-11-14 LAB — GLUCOSE, CAPILLARY
Glucose-Capillary: 259 mg/dL — ABNORMAL HIGH (ref 70–99)
Glucose-Capillary: 99 mg/dL (ref 70–99)

## 2021-11-14 LAB — ECHOCARDIOGRAM COMPLETE
AR max vel: 1.88 cm2
AV Area VTI: 1.98 cm2
AV Area mean vel: 2.13 cm2
AV Mean grad: 4 mmHg
AV Peak grad: 9.9 mmHg
Ao pk vel: 1.57 m/s
Area-P 1/2: 2.32 cm2
Calc EF: 49.2 %
Height: 62 in
S' Lateral: 3.5 cm
Single Plane A2C EF: 43.2 %
Single Plane A4C EF: 53 %
Weight: 2000 oz

## 2021-11-14 LAB — LIPID PANEL
Cholesterol: 183 mg/dL (ref 0–200)
HDL: 42 mg/dL (ref >40)
LDL Cholesterol: 101 mg/dL — ABNORMAL HIGH (ref 0–99)
Total CHOL/HDL Ratio: 4.4 RATIO
Triglycerides: 198 mg/dL — ABNORMAL HIGH (ref <150)
VLDL: 40 mg/dL (ref 0–40)

## 2021-11-14 LAB — HEMOGLOBIN A1C
Hgb A1c MFr Bld: 7.8 % — ABNORMAL HIGH (ref 4.8–5.6)
Mean Plasma Glucose: 177.16 mg/dL

## 2021-11-14 LAB — CBG MONITORING, ED: Glucose-Capillary: 190 mg/dL — ABNORMAL HIGH (ref 70–99)

## 2021-11-14 LAB — TSH: TSH: 1.088 u[IU]/mL (ref 0.350–4.500)

## 2021-11-14 LAB — T4, FREE: Free T4: 1.13 ng/dL — ABNORMAL HIGH (ref 0.61–1.12)

## 2021-11-14 MED ORDER — ASPIRIN EC 81 MG PO TBEC
81.0000 mg | DELAYED_RELEASE_TABLET | Freq: Every day | ORAL | Status: DC
  Administered 2021-11-15 – 2021-11-18 (×4): 81 mg via ORAL
  Filled 2021-11-14 (×5): qty 1

## 2021-11-14 MED ORDER — STROKE: EARLY STAGES OF RECOVERY BOOK
Freq: Once | Status: AC
  Filled 2021-11-14 (×2): qty 1

## 2021-11-14 MED ORDER — INSULIN ASPART 100 UNIT/ML IJ SOLN
0.0000 [IU] | Freq: Every day | INTRAMUSCULAR | Status: DC
  Administered 2021-11-19: 2 [IU] via SUBCUTANEOUS

## 2021-11-14 MED ORDER — TICAGRELOR 90 MG PO TABS
180.0000 mg | ORAL_TABLET | Freq: Once | ORAL | Status: AC
  Administered 2021-11-14: 180 mg via ORAL
  Filled 2021-11-14: qty 2

## 2021-11-14 MED ORDER — ACETAMINOPHEN 325 MG PO TABS
650.0000 mg | ORAL_TABLET | ORAL | Status: DC | PRN
  Administered 2021-11-15 – 2021-11-18 (×2): 650 mg via ORAL
  Filled 2021-11-14 (×2): qty 2

## 2021-11-14 MED ORDER — TICAGRELOR 90 MG PO TABS
90.0000 mg | ORAL_TABLET | Freq: Two times a day (BID) | ORAL | Status: DC
  Administered 2021-11-15 – 2021-11-19 (×9): 90 mg via ORAL
  Filled 2021-11-14 (×9): qty 1

## 2021-11-14 MED ORDER — PANTOPRAZOLE SODIUM 40 MG PO TBEC
40.0000 mg | DELAYED_RELEASE_TABLET | Freq: Every day | ORAL | Status: DC
  Administered 2021-11-15 – 2021-11-20 (×5): 40 mg via ORAL
  Filled 2021-11-14 (×5): qty 1

## 2021-11-14 MED ORDER — CLOPIDOGREL BISULFATE 75 MG PO TABS: 75.0000 mg | ORAL_TABLET | Freq: Every day | ORAL | Status: DC

## 2021-11-14 MED ORDER — ACETAMINOPHEN 650 MG RE SUPP: 650.0000 mg | RECTAL | Status: DC | PRN

## 2021-11-14 MED ORDER — EZETIMIBE 10 MG PO TABS
10.0000 mg | ORAL_TABLET | Freq: Every day | ORAL | Status: DC
  Administered 2021-11-14 – 2021-11-20 (×6): 10 mg via ORAL
  Filled 2021-11-14 (×6): qty 1

## 2021-11-14 MED ORDER — INSULIN GLARGINE-YFGN 100 UNIT/ML ~~LOC~~ SOLN: 30.0000 [IU] | Freq: Every day | SUBCUTANEOUS | Status: DC

## 2021-11-14 MED ORDER — GABAPENTIN 100 MG PO CAPS
200.0000 mg | ORAL_CAPSULE | Freq: Every morning | ORAL | Status: DC
  Administered 2021-11-14 – 2021-11-20 (×6): 200 mg via ORAL
  Filled 2021-11-14 (×6): qty 2

## 2021-11-14 MED ORDER — ATORVASTATIN CALCIUM 80 MG PO TABS
80.0000 mg | ORAL_TABLET | Freq: Every day | ORAL | Status: DC
  Administered 2021-11-14 – 2021-11-19 (×6): 80 mg via ORAL
  Filled 2021-11-14 (×6): qty 1

## 2021-11-14 MED ORDER — INSULIN GLARGINE-YFGN 100 UNIT/ML ~~LOC~~ SOLN
15.0000 [IU] | Freq: Every day | SUBCUTANEOUS | Status: DC
  Administered 2021-11-14 – 2021-11-15 (×2): 15 [IU] via SUBCUTANEOUS
  Filled 2021-11-14 (×3): qty 0.15

## 2021-11-14 MED ORDER — ACETAMINOPHEN 160 MG/5ML PO SOLN: 650.0000 mg | ORAL | Status: DC | PRN

## 2021-11-14 MED ORDER — ATORVASTATIN CALCIUM 40 MG PO TABS
40.0000 mg | ORAL_TABLET | Freq: Every day | ORAL | Status: DC
Start: 2021-11-14 — End: 2021-11-14

## 2021-11-14 MED ORDER — HEPARIN SODIUM (PORCINE) 5000 UNIT/ML IJ SOLN
5000.0000 [IU] | Freq: Three times a day (TID) | INTRAMUSCULAR | Status: DC
  Administered 2021-11-14 – 2021-11-18 (×14): 5000 [IU] via SUBCUTANEOUS
  Filled 2021-11-14 (×15): qty 1

## 2021-11-14 MED ORDER — BUPROPION HCL ER (XL) 300 MG PO TB24
300.0000 mg | ORAL_TABLET | Freq: Every day | ORAL | Status: DC
  Administered 2021-11-15 – 2021-11-20 (×5): 300 mg via ORAL
  Filled 2021-11-14 (×2): qty 2
  Filled 2021-11-14: qty 1
  Filled 2021-11-14 (×2): qty 2

## 2021-11-14 MED ORDER — GABAPENTIN 100 MG PO CAPS: 100.0000 mg | ORAL_CAPSULE | ORAL | Status: DC

## 2021-11-14 MED ORDER — INSULIN ASPART 100 UNIT/ML IJ SOLN
0.0000 [IU] | Freq: Three times a day (TID) | INTRAMUSCULAR | Status: DC
  Administered 2021-11-14: 5 [IU] via SUBCUTANEOUS
  Administered 2021-11-15: 1 [IU] via SUBCUTANEOUS
  Administered 2021-11-15 – 2021-11-17 (×3): 2 [IU] via SUBCUTANEOUS
  Administered 2021-11-17: 1 [IU] via SUBCUTANEOUS
  Administered 2021-11-18 – 2021-11-20 (×4): 2 [IU] via SUBCUTANEOUS
  Administered 2021-11-20: 3 [IU] via SUBCUTANEOUS

## 2021-11-14 MED ORDER — LEVOTHYROXINE SODIUM 88 MCG PO TABS
88.0000 ug | ORAL_TABLET | Freq: Every day | ORAL | Status: DC
  Administered 2021-11-15 – 2021-11-20 (×5): 88 ug via ORAL
  Filled 2021-11-14 (×5): qty 1

## 2021-11-14 MED ORDER — ASPIRIN 325 MG PO TABS
325.0000 mg | ORAL_TABLET | Freq: Once | ORAL | Status: AC
  Administered 2021-11-14: 325 mg via ORAL
  Filled 2021-11-14: qty 1

## 2021-11-14 MED ORDER — CLOPIDOGREL BISULFATE 300 MG PO TABS
300.0000 mg | ORAL_TABLET | Freq: Once | ORAL | Status: AC
  Administered 2021-11-14: 300 mg via ORAL
  Filled 2021-11-14: qty 1

## 2021-11-14 MED ORDER — LORATADINE 10 MG PO TABS
10.0000 mg | ORAL_TABLET | Freq: Every day | ORAL | Status: DC
  Administered 2021-11-15 – 2021-11-20 (×5): 10 mg via ORAL
  Filled 2021-11-14 (×5): qty 1

## 2021-11-14 MED ORDER — GABAPENTIN 100 MG PO CAPS
100.0000 mg | ORAL_CAPSULE | Freq: Every day | ORAL | Status: DC
  Administered 2021-11-14 – 2021-11-19 (×6): 100 mg via ORAL
  Filled 2021-11-14 (×7): qty 1

## 2021-11-14 MED ORDER — AMITRIPTYLINE HCL 50 MG PO TABS
100.0000 mg | ORAL_TABLET | Freq: Every evening | ORAL | Status: DC | PRN
  Administered 2021-11-18: 100 mg via ORAL
  Filled 2021-11-14 (×2): qty 1
  Filled 2021-11-14: qty 2
  Filled 2021-11-14: qty 1
  Filled 2021-11-14: qty 2

## 2021-11-14 MED ORDER — DAPAGLIFLOZIN PROPANEDIOL 10 MG PO TABS
10.0000 mg | ORAL_TABLET | Freq: Every day | ORAL | Status: DC
Start: 2021-11-14 — End: 2021-11-14

## 2021-11-14 NOTE — Hospital Course (Signed)
Sonia Skinner is a 71 year old female with past medical history significant for type 2 diabetes mellitus, essential hypertension, ICA stenosis s/p left carotid endarterectomy 2015, CKD stage IIIb, GERD, history of CVA who presented to [facility] ED on 3/31 with a visual changes described as "vision white out", feeling of tongue thickening, gait disturbance and speech impairment.  Visual changes onset roughly 3 days prior.  Speech impairment reported lasting about 30 minutes.  Additionally with fatigue and dizziness which prompted her to seek care in the ED. ? ?In the ED, temperature 98.7 ?F, HR 98, RR 20, BP 189/66, SPO2 99% on room air.  WBC 10.4, hemoglobin 11.3, platelets 240. Sodium 142, potassium 4.5, chloride 109, CO2 26, glucose 90, BUN 24, creatinine 2.02.  High sensitive troponin less than 2 x 2.  Urinalysis unrevealing.  CT head without contrast with no acute intracranial findings, noted old infarct left parietal cortex unchanged.  Chest x-ray with no evidence of active cardiopulmonary disease process.  Neurology was consulted.  Patient was transferred to [facility] for further stroke versus TIA work-up. ?

## 2021-11-14 NOTE — Assessment & Plan Note (Addendum)
Home regimen includes insulin glargine 30-50 units supposedly daily, Farxiga 10 mg p.o. daily, semaglutide 2 mg every 2 weeks. ?--Hemoglobin A1c: 7.8 ?--SSI for coverage ?--CBG before every meal/at bedtime ?

## 2021-11-14 NOTE — Assessment & Plan Note (Addendum)
TSH 1.088, free T4 1.13. ?--Continue home levothyroxine 88 mcg p.o. daily ?

## 2021-11-14 NOTE — Progress Notes (Signed)
? ? ?  Echocardiogram ?2D Echocardiogram has been performed. ? ?Sonia Skinner ?11/14/2021, 12:17 PM ?

## 2021-11-14 NOTE — Progress Notes (Signed)
EEG complete - results pending 

## 2021-11-14 NOTE — Assessment & Plan Note (Addendum)
1.3 cm left parietal lesion noted on MRI head/neck. Will need outpatient ENT referral. ?

## 2021-11-14 NOTE — Evaluation (Signed)
Physical Therapy Evaluation and DISCHARGE ?Patient Details ?Name: Sonia Skinner ?MRN: [id number] ?DOB: 1951-02-07 ?Today's Date: 11/14/2021 ? ?History of Present Illness ? Pt is a 71yo female presenting with "vision whiteout" while driving then felt that her tongue was thick and unable to get words out MRI and CT negative for stroke. MRA showed 70% stenosis of proximal cervical ICA. PMH: DM, L MCA 10/2010, thyroid disease, PSH: 10/2013 carotid endarterectomy. ?  ?Clinical Impression ? Pt's symptoms have resolved. Pt has returned to baseline level of function and is ambulating indep and has minimal falls risk as noted by score of 24/24 on DGI. Pt educated on "BE FAST'. Pt with good understanding. Pt with no further acute PT needs at this time. PT SIGNING OFF. PLease re-consult if needed in future.   ?   ? ?Recommendations for follow up therapy are one component of a multi-disciplinary discharge planning process, led by the attending physician.  Recommendations may be updated based on patient status, additional functional criteria and insurance authorization. ? ?Follow Up Recommendations No PT follow up ? ?  ?Assistance Recommended at Discharge None  ?Patient can return home with the following ?   ? ?  ?Equipment Recommendations None recommended by PT  ?Recommendations for Other Services ?    ?  ?Functional Status Assessment Patient has not had a recent decline in their functional status  ? ?  ?Precautions / Restrictions Precautions ?Precautions: None ?Restrictions ?Weight Bearing Restrictions: No  ? ?  ? ?Mobility ? Bed Mobility ?Overal bed mobility: Independent ?  ?  ?  ?  ?  ?  ?  ?  ? ?Transfers ?Overall transfer level: Independent ?Equipment used: None ?  ?  ?  ?  ?  ?  ?  ?  ?  ? ?Ambulation/Gait ?Ambulation/Gait assistance: Independent ?Gait Distance (Feet): 500 Feet ?Assistive device: None ?Gait Pattern/deviations: WFL(Within Functional Limits) ?Gait velocity: wfl ?  ?  ?General Gait Details: no episodes of LOB, pt  ambulating to/from bathroom indep  in ED ? ?Stairs ?  ?  ?  ?  ?  ? ?Wheelchair Mobility ?  ? ?Modified Rankin (Stroke Patients Only) ?Modified Rankin (Stroke Patients Only) ?Pre-Morbid Rankin Score: No symptoms ?Modified Rankin: No symptoms ? ?  ? ?Balance Overall balance assessment: No apparent balance deficits (not formally assessed) ?  ?  ?  ?  ?  ?  ?  ?  ?  ?  ?  ?  ?  ?  ?  ?Standardized Balance Assessment ?Standardized Balance Assessment : Dynamic Gait Index ?  ?Dynamic Gait Index ?Level Surface: Normal ?Change in Gait Speed: Normal ?Gait with Horizontal Head Turns: Normal ?Gait with Vertical Head Turns: Normal ?Gait and Pivot Turn: Normal ?Step Over Obstacle: Normal ?Step Around Obstacles: Normal ?Steps: Normal ?Total Score: 24 ?   ? ? ? ?Pertinent Vitals/Pain Pain Assessment ?Pain Assessment: No/denies pain  ? ? ?Home Living Family/patient expects to be discharged to:: Private residence ?Living Arrangements: Other relatives (sister) ?Available Help at Discharge: Family;Available 24 hours/day ?Type of Home: House ?Home Access: Level entry ?  ?  ?  ?Home Layout: One level ?Home Equipment: None ?   ?  ?Prior Function Prior Level of Function : Independent/Modified Independent;Working/employed;Driving ?  ?  ?  ?  ?  ?  ?Mobility Comments: indep, works as a Oceanographer ?ADLs Comments: indep ?  ? ? ?Hand Dominance  ? Dominant Hand: Left ? ?  ?Extremity/Trunk Assessment  ? Upper Extremity  Assessment ?Upper Extremity Assessment: Overall WFL for tasks assessed ?  ? ?Lower Extremity Assessment ?Lower Extremity Assessment: Overall WFL for tasks assessed ?  ? ?Cervical / Trunk Assessment ?Cervical / Trunk Assessment: Normal  ?Communication  ? Communication: No difficulties  ?Cognition Arousal/Alertness: Awake/alert ?Behavior During Therapy: [facility] for tasks assessed/performed ?Overall Cognitive Status: Within Functional Limits for tasks assessed ?  ?  ?  ?  ?  ?  ?  ?  ?  ?  ?  ?  ?  ?  ?  ?  ?  ?  ?  ? ?  ?General  Comments General comments (skin integrity, edema, etc.): VSS ? ?  ?Exercises    ? ?Assessment/Plan  ?  ?PT Assessment Patient does not need any further PT services  ?PT Problem List   ? ?   ?  ?PT Treatment Interventions     ? ?PT Goals (Current goals can be found in the Care Plan section)  ?Acute Rehab PT Goals ?Patient Stated Goal: home today ?PT Goal Formulation: All assessment and education complete, DC therapy ? ?  ?Frequency   ?  ? ? ?Co-evaluation   ?  ?  ?  ?  ? ? ?  ?AM-PAC PT "6 Clicks" Mobility  ?Outcome Measure Help needed turning from your back to your side while in a flat bed without using bedrails?: None ?Help needed moving from lying on your back to sitting on the side of a flat bed without using bedrails?: None ?Help needed moving to and from a bed to a chair (including a wheelchair)?: None ?Help needed standing up from a chair using your arms (e.g., wheelchair or bedside chair)?: None ?Help needed to walk in hospital room?: None ?Help needed climbing 3-5 steps with a railing? : None ?6 Click Score: 24 ? ?  ?End of Session Equipment Utilized During Treatment: Gait belt ?Activity Tolerance: Patient tolerated treatment well ?Patient left: in bed;with call bell/phone within reach ?Nurse Communication: Mobility status ?PT Visit Diagnosis: Unsteadiness on feet (R26.81) ?  ? ?Time: 6948-5462 ?PT Time Calculation (min) (ACUTE ONLY): 15 min ? ? ?Charges:   PT Evaluation ?$PT Eval Low Complexity: 1 Low ?  ?  ?   ? ? ?Kittie Plater, PT, DPT ?Acute Rehabilitation Services ?Secure chat preferred ?Office #: [phone] ? ? ?Millisa Giarrusso M Caidynce Muzyka ?11/14/2021, 12:37 PM ? ?

## 2021-11-14 NOTE — Progress Notes (Addendum)
STROKE TEAM PROGRESS NOTE   INTERVAL HISTORY Patient is seen in her room with no family at the bedside.  She reports that yesterday she wasn't feeling well and that while she was at work, she had an episode of impaired speech where her tongue felt thick and she was unable to get the words out.  This resolved spontaneously.  She has had several of these episodes over the last month.  MRI of the brain shows no acute stroke, but MRA of the neck shows 70% stenosis of the left ICA with possible ulcerated plaque.    Vitals:   11/13/21 2020 11/13/21 2338 11/14/21 0322 11/14/21 0830  BP: (!) 112/41 (!) 140/46 (!) 138/50 (!) 140/52  Pulse: 76 81 80 79  Resp: 16 20 18 18   Temp: 98.7 F (37.1 C)     TempSrc: Oral     SpO2: 97% 100% 99% 100%  Weight:      Height:       CBC:  Recent Labs  Lab 11/13/21 1356  WBC 10.4  NEUTROABS 6.5  HGB 11.3*  HCT 36.2  MCV 88.1  PLT 240   Basic Metabolic Panel:  Recent Labs  Lab 11/13/21 1843  NA 142  K 4.5  CL 109  CO2 26  GLUCOSE 90  BUN 24*  CREATININE 2.02*  CALCIUM 9.7   Lipid Panel: No results for input(s): CHOL, TRIG, HDL, CHOLHDL, VLDL, LDLCALC in the last 168 hours. HgbA1c: No results for input(s): HGBA1C in the last 168 hours. Urine Drug Screen: No results for input(s): LABOPIA, COCAINSCRNUR, LABBENZ, AMPHETMU, THCU, LABBARB in the last 168 hours.  Alcohol Level No results for input(s): ETH in the last 168 hours.  IMAGING past 24 hours DG Chest 2 View  Result Date: 11/13/2021 CLINICAL DATA:  Dizziness with blurred vision and lethargy for 24 hours. EXAM: CHEST - 2 VIEW COMPARISON:  Radiographs 12/11/2016 and 06/12/2015. FINDINGS: The heart size and mediastinal contours are normal. The lungs are clear. There is no pleural effusion or pneumothorax. No acute osseous findings are identified. Stable degenerative changes throughout the spine. Telemetry leads overlie the chest. IMPRESSION: No evidence of active cardiopulmonary process.  Electronically Signed   By: Carey Bullocks M.D.   On: 11/13/2021 14:38   CT Head Wo Contrast  Result Date: 11/13/2021 CLINICAL DATA:  Dizziness, blurred vision EXAM: CT HEAD WITHOUT CONTRAST TECHNIQUE: Contiguous axial images were obtained from the base of the skull through the vertex without intravenous contrast. RADIATION DOSE REDUCTION: This exam was performed according to the departmental dose-optimization program which includes automated exposure control, adjustment of the mA and/or kV according to patient size and/or use of iterative reconstruction technique. COMPARISON:  08/13/2021 FINDINGS: Brain: No acute intracranial findings are seen. Is old infarct in the periventricular region in the left parietal cortex with no significant change. Cortical sulci are prominent. Vascular: Unremarkable. Skull: Unremarkable. Sinuses/Orbits: There is mucosal thickening in the ethmoid and sphenoid sinuses. Other: None IMPRESSION: No acute intracranial findings are seen in noncontrast CT brain. Atrophy. Old infarct in the left parietal cortex has not changed. Chronic sinusitis. Electronically Signed   By: Ernie Avena M.D.   On: 11/13/2021 14:49   MR ANGIO HEAD WO CONTRAST  Addendum Date: 11/14/2021   ADDENDUM REPORT: 11/14/2021 00:36 ADDENDUM: In addition to the initially described findings, there is a 1.3 cm nodular soft tissue lesion within the left parotid gland, described in the body portion of the report, omitted from the impression by mistake. Finding  is indeterminate, but suspicious for a primary salivary neoplasm. Few additional smaller nodule lesions about both parotid glands are indeterminate, and could reflect intraparotid lymph nodes versus additional salivary lesions. Nonemergent outpatient ENT consultation and referral suggested for further evaluation. Electronically Signed   By: Rise Mu M.D.   On: 11/14/2021 00:36   Result Date: 11/14/2021 CLINICAL DATA:  Initial evaluation for  acute neuro deficit, stroke, diplopia. EXAM: MRA NECK WITHOUT CONTRAST MRA HEAD WITHOUT CONTRAST TECHNIQUE: Angiographic images of the Circle of Willis were acquired using MRA technique without intravenous contrast. COMPARISON:  CT from earlier the same day. FINDINGS: MRA NECK FINDINGS AORTIC ARCH: Visualized aortic arch normal caliber with normal branch pattern. Atheromatous irregularity about the arch itself. No significant stenosis about the origin the great vessels. RIGHT CAROTID SYSTEM: Right CCA patent from its origin to the bifurcation without stenosis. Concentric atheromatous plaque about the right carotid bulb with associated 40% stenosis by NASCET criteria. Right ICA otherwise patent distally without stenosis or evidence for dissection. LEFT CAROTID SYSTEM: Left CCA patent from its origin to the bifurcation without stenosis. Atheromatous irregularity about the left carotid bulb/proximal left ICA with associated severe at least 70% stenosis by NASCET criteria (series 2, image 46). Probable superimposed penetrating atheromatous plaque noted at proximal cervical left ICA (series 2, image 47). Left ICA otherwise patent distally without stenosis or evidence for dissection. ). VERTEBRAL ARTERIES: Both vertebral arteries arise from the subclavian arteries. No proximal subclavian artery stenosis. Atheromatous irregularity about the origin and proximal left V1 segment with associated stenoses of up to approximately 70% (series 2, images 99, 85). Vertebral arteries mildly irregular but otherwise patent distally without stenosis or evidence for dissection. Other: Few soft tissue nodular lesions seen within the left parotid gland, largest of which measures 1.3 cm (series 2, image 22). 8 mm nodular lesion present within the contralateral right parotid gland (series 2, image 14). MRA HEAD FINDINGS ANTERIOR CIRCULATION: Visualized distal cervical segments of the internal carotid arteries are patent with antegrade flow.  Petrous segments patent bilaterally. Atheromatous plaque seen throughout the carotid siphons with associated stenoses of up to approximately 50-60% bilaterally. 3 mm laterally directed outpouching arising from the cavernous right ICA suspicious for aneurysm, stable (series 2, image 91). A1 segments, anterior communicating artery complex, and ACAs widely patent. No M1 stenosis or occlusion. No proximal MCA branch occlusion. Distal MCA branches perfused and symmetric. POSTERIOR CIRCULATION: Both vertebral arteries patent without stenosis. Left vertebral artery dominant. Both PICA patent. Basilar patent without stenosis. Superior cerebellar arteries patent bilaterally. Both PCAs primarily supplied via the basilar and are widely patent to their distal aspects. IMPRESSION: 1. Negative MRA for large vessel occlusion or other emergent finding. 2. Severe at least 70% stenosis involving the proximal cervical left ICA. Probable superimposed penetrating atheromatous ulcer at this level. 3. Atheromatous change about the carotid siphons with associated stenoses of up to approximately 50-60% bilaterally. 4. Approximate 70% segmental stenoses involving the origin and proximal left V1 segment. 5. 3 mm laterally directed outpouching arising from the cavernous right ICA, suspicious for aneurysm, stable. Electronically Signed: By: Rise Mu M.D. On: 11/14/2021 00:11   MR ANGIO NECK WO CONTRAST  Addendum Date: 11/14/2021   ADDENDUM REPORT: 11/14/2021 00:36 ADDENDUM: In addition to the initially described findings, there is a 1.3 cm nodular soft tissue lesion within the left parotid gland, described in the body portion of the report, omitted from the impression by mistake. Finding is indeterminate, but suspicious for a primary salivary  neoplasm. Few additional smaller nodule lesions about both parotid glands are indeterminate, and could reflect intraparotid lymph nodes versus additional salivary lesions. Nonemergent  outpatient ENT consultation and referral suggested for further evaluation. Electronically Signed   By: Rise Mu M.D.   On: 11/14/2021 00:36   Result Date: 11/14/2021 CLINICAL DATA:  Initial evaluation for acute neuro deficit, stroke, diplopia. EXAM: MRA NECK WITHOUT CONTRAST MRA HEAD WITHOUT CONTRAST TECHNIQUE: Angiographic images of the Circle of Willis were acquired using MRA technique without intravenous contrast. COMPARISON:  CT from earlier the same day. FINDINGS: MRA NECK FINDINGS AORTIC ARCH: Visualized aortic arch normal caliber with normal branch pattern. Atheromatous irregularity about the arch itself. No significant stenosis about the origin the great vessels. RIGHT CAROTID SYSTEM: Right CCA patent from its origin to the bifurcation without stenosis. Concentric atheromatous plaque about the right carotid bulb with associated 40% stenosis by NASCET criteria. Right ICA otherwise patent distally without stenosis or evidence for dissection. LEFT CAROTID SYSTEM: Left CCA patent from its origin to the bifurcation without stenosis. Atheromatous irregularity about the left carotid bulb/proximal left ICA with associated severe at least 70% stenosis by NASCET criteria (series 2, image 46). Probable superimposed penetrating atheromatous plaque noted at proximal cervical left ICA (series 2, image 47). Left ICA otherwise patent distally without stenosis or evidence for dissection. ). VERTEBRAL ARTERIES: Both vertebral arteries arise from the subclavian arteries. No proximal subclavian artery stenosis. Atheromatous irregularity about the origin and proximal left V1 segment with associated stenoses of up to approximately 70% (series 2, images 99, 85). Vertebral arteries mildly irregular but otherwise patent distally without stenosis or evidence for dissection. Other: Few soft tissue nodular lesions seen within the left parotid gland, largest of which measures 1.3 cm (series 2, image 22). 8 mm nodular lesion  present within the contralateral right parotid gland (series 2, image 14). MRA HEAD FINDINGS ANTERIOR CIRCULATION: Visualized distal cervical segments of the internal carotid arteries are patent with antegrade flow. Petrous segments patent bilaterally. Atheromatous plaque seen throughout the carotid siphons with associated stenoses of up to approximately 50-60% bilaterally. 3 mm laterally directed outpouching arising from the cavernous right ICA suspicious for aneurysm, stable (series 2, image 91). A1 segments, anterior communicating artery complex, and ACAs widely patent. No M1 stenosis or occlusion. No proximal MCA branch occlusion. Distal MCA branches perfused and symmetric. POSTERIOR CIRCULATION: Both vertebral arteries patent without stenosis. Left vertebral artery dominant. Both PICA patent. Basilar patent without stenosis. Superior cerebellar arteries patent bilaterally. Both PCAs primarily supplied via the basilar and are widely patent to their distal aspects. IMPRESSION: 1. Negative MRA for large vessel occlusion or other emergent finding. 2. Severe at least 70% stenosis involving the proximal cervical left ICA. Probable superimposed penetrating atheromatous ulcer at this level. 3. Atheromatous change about the carotid siphons with associated stenoses of up to approximately 50-60% bilaterally. 4. Approximate 70% segmental stenoses involving the origin and proximal left V1 segment. 5. 3 mm laterally directed outpouching arising from the cavernous right ICA, suspicious for aneurysm, stable. Electronically Signed: By: Rise Mu M.D. On: 11/14/2021 00:11   MR BRAIN WO CONTRAST  Result Date: 11/14/2021 CLINICAL DATA:  Initial evaluation for neuro deficit, stroke suspected. EXAM: MRI HEAD WITHOUT CONTRAST TECHNIQUE: Multiplanar, multiecho pulse sequences of the brain and surrounding structures were obtained without intravenous contrast. COMPARISON:  Prior CT from 11/13/2021. FINDINGS: Brain:  Generalized age-related cerebral atrophy. Few small remote lacunar infarcts present at the right thalamus. Chronic left MCA distribution infarct involving  the posterior left frontoparietal region noted as well. No abnormal foci of restricted diffusion to suggest acute or subacute ischemia. Gray-white matter differentiation otherwise maintained. No other areas of chronic cortical infarction. No acute or chronic intracranial blood products. No mass lesion or midline shift. No hydrocephalus or extra-axial fluid collection. Pituitary gland suprasellar region within normal limits. Midline structures intact. Vascular: Major intracranial vascular flow voids are maintained. Skull and upper cervical spine: Craniocervical junction within normal limits. Bone marrow signal intensity normal. No scalp soft tissue abnormality. Sinuses/Orbits: Prior bilateral ocular lens replacement. Globes orbital soft tissues demonstrate no acute finding. Scattered mucosal thickening noted throughout the paranasal sinuses with trace layering fluid within the left sphenoid sinus. No significant mastoid effusion. Inner ear structures grossly normal. Other: 1.3 cm T2 hypointense lesion noted within the left parotid gland, indeterminate. IMPRESSION: 1. No acute intracranial abnormality. 2. Chronic left MCA distribution infarct, with a few small remote right thalamic lacunar infarcts. 3. 1.3 cm T2 hypointense left parotid lesion, indeterminate, but could reflect a primary salivary neoplasm. Nonemergent outpatient ENT referral for further workup and evaluation suggested. Electronically Signed   By: Rise Mu M.D.   On: 11/14/2021 03:29    PHYSICAL EXAM General:  Alert, well-nourished, well-developed patient in no acute distress Respiratory:  Regular, unlabored respirations on room air  NEURO:  Mental Status: AA&Ox3  Speech/Language: speech is without dysarthria or aphasia.  Naming, repetition, fluency, and comprehension  intact.  Cranial Nerves:  II: PERRL. Visual fields full.  III, IV, VI: EOMI. Eyelids elevate symmetrically.  V: Sensation is intact to light touch and symmetrical to face.  VII: Smile is symmetrical.   VIII: hearing intact to voice. IX, X: Phonation is normal.  VZ:DGLOVFIE shrug 5/5. XII: tongue is midline without fasciculations. Motor: 5/5 strength to all muscle groups tested.  Tone: is normal and bulk is normal Sensation- Intact to light touch bilaterally.   Coordination: FTN intact bilaterally Gait- deferred   ASSESSMENT/PLAN Ms. SARON KEIGHTLEY is a 71 y.o. female with history of DM2, GERD, small L MCA stroke, HTN, headaches and hypothyroidism presenting with an episode of impaired speech where her tongue felt thick and she was unable to get the words out.  This resolved spontaneously.  She has had several of these episodes over the last month.  MRI of the brain shows no acute stroke, but MRA of the neck shows 70% stenosis of the left ICA with possible ulcerated plaque.  Will consult IR to discuss options for this.  TIA:  in setting of 70% left ICA stenosis with ulcerated plaque CT head No acute abnormality. Atrophy. Old left parietal cortex infarction MRI  Chronic left MCA infarct with small remote right thalamic lacunar infarcts, 1.3 cm left parotid lesion MRA  Megative for LVO or emergent findings, severe 70% stenosis of left ICA with atheromatous ulcer, 50-60% stenosis of bilateral carotid siphons, 3mm outpouching from right ICA, 70% stenosis of left V1 segment 2D Echo pending LDL 109 HgbA1c 8.0 VTE prophylaxis - SCDs, SQH    Diet   Diet heart healthy/carb modified Room service appropriate? Yes; Fluid consistency: Thin   aspirin 325 mg daily prior to admission, now on aspirin 81 mg daily and clopidogrel 75 mg daily.  Therapy recommendations:  pending Disposition:  pending  Hypertension Home meds:  Edarbyclor 40-12.5 mg daily Stable Keep BP <180-105 Long-term BP goal  normotensive  Hyperlipidemia Home meds:  atorvastatin 80 mg daily, resumed in hospital LDL 109, goal < 70 Add zetia  10 mg daily  Continue statin at discharge  Diabetes type II Uncontrolled Home meds:  Farxiga 10 mg daily, insulin glargine 30-50 units daily HgbA1c 8.0, goal < 7.0 CBGs Recent Labs    11/13/21 1341  GLUCAP 121*    SSI  Other Stroke Risk Factors Advanced Age >/= 19  Former cigarette smoker  Hx stroke  Other Active Problems Left parotid lesion seen on MRI Recommend outpatient ENT follow up Hypothyroidism Continue home synthroid  Hospital day # 0  ATTENDING ATTESTATION:  71 year old lady with history of chronic left MCA infarcts status post left CEA, diabetes, hypertension presented with speech disturbance that has resolved.  TIA work-up pending.  MRI has not shown a new CVA.  70% stenosis in the left ICA noted.  Exam is unremarkable.  Consulted Dr. Corliss Skains who will see the patient tomorrow for possible intervention.  He recommended loading with Brilinta 180mg  then 90mg  BID along with aspirin 81mg .  Dr. Viviann Spare evaluated pt independently, reviewed imaging, chart, labs. Discussed and formulated plan with the APP. Please see APP note above for details.   Total 36 minutes spent on counseling patient and coordinating care, writing notes and reviewing chart.  Braeley Buskey,MD     To contact Stroke Continuity provider, please refer to [email or web address]. After hours, contact General Neurology

## 2021-11-14 NOTE — Assessment & Plan Note (Addendum)
Home regimen includes amlodipine 10 mg p.o. daily. ?--BP 126/90 this am ?--Continue to hold home amlodipine and monitor BP closely ?

## 2021-11-14 NOTE — ED Provider Notes (Signed)
?  Provider Note ?MRN:  [id number]  ?Arrival date & time: 11/14/21    ?ED Course and Medical Decision Making  ?Assumed care from Dr. Gilford Raid at shift change ? ?Visual disturbance and speech disturbance on and off for the past few days here for MRI, transferred from [place]. ? ?MR imaging is without acute stroke but there is significant vascular disease and neurology recommends admission. ? ?Procedures ? ?Final Clinical Impressions(s) / ED Diagnoses  ? ?  ICD-10-CM   ?1. Visual disturbance  H53.9   ? resolved  ?  ?2. Gait disturbance  R26.9   ?  ?  ?ED Discharge Orders   ? ? None  ? ?  ?  ?Discharge Instructions   ?None ?  ? ? ?Barth Kirks. Sedonia Small, MD ?[facility] ?[email or web address] ? ?  ?Maudie Flakes, MD ?11/14/21 [phone] ? ?

## 2021-11-14 NOTE — Assessment & Plan Note (Addendum)
Lipid panel with total cholesterol 183, LDL 101, triglycerides 198, HDL 42. ?--Atorvastatin 80 mg p.o. daily ?--Zetia 10 mg p.o. daily ?

## 2021-11-14 NOTE — Progress Notes (Signed)
Sonia Skinner  ?Code Status: FULL ?[name] is a 71 y.o. female patient admitted from ED awake, alert - oriented X4 - no acute distress noted. VSS -  no c/o shortness of breath, no c/o chest pain. Cardiac tele in place. ?Call light within reach, patient able to voice, and demonstrate understanding. Skin, clean-dry- intact without evidence of bruising, or skin tears.  ?No evidence of skin break down noted on exam.  ??  ?Will cont to eval and treat per MD orders.  ?Melonie Florida, RN  ?11/14/2021  ?

## 2021-11-14 NOTE — Consult Note (Signed)
NEUROLOGY CONSULTATION NOTE  ? ?Date of service: November 14, 2021 ?Patient Name: Sonia Skinner ?MRN:  [id number] ?DOB:  Nov 02, 1950 ?Reason for consult: "dizziness, speech difficulty, feeling off" ?Requesting Provider: Maudie Flakes, MD ?_ _ _   _ __   _ __ _ _  __ __   _ __   __ _ ? ?History of Present Illness  ?Sonia Skinner is a 71 y.o. female with PMH significant for DM2, GERD, prior small L MCA stroke, HTN, headaches, hypothyroidism who presents with multiple complaints. Most significantly, reports episode of entire vision going white while she was driving on Wednesday but was brief and endorses some lightheadedness during that too. 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 This went on for about 30-40 mins and then resolved. Reports she also had an episode later where she felt off. She is unable to describe this any further. ? ?On further questioning, reports sporadic episodes of speech abnormality over the last month. Also reports that despite her stroke being on the left, over the last year she has noted that her hand writing with her left hand has gotten a little worse and her left leg gives her more trouble. ? ?No hx of seizures, quit smoking. ? ?LKW: 11/11/21 but no symptoms at persent. ?mRS: 0 ?tNKASE: not offered due to no symptoms at present. ?Thrombectomy: not offered, no symptoms at present. ? ?NIHSS components Score: Comment  ?1a Level of Conscious 0'[x]'$  1'[]'$  2'[]'$  3'[]'$      ?1b LOC Questions 0'[x]'$  1'[]'$  2'[]'$       ?1c LOC Commands 0'[x]'$  1'[]'$  2'[]'$       ?2 Best Gaze 0'[x]'$  1'[]'$  2'[]'$       ?3 Visual 0'[x]'$  1'[]'$  2'[]'$  3'[]'$      ?4 Facial Palsy 0'[x]'$  1'[]'$  2'[]'$  3'[]'$      ?5a Motor Arm - left 0'[x]'$  1'[]'$  2'[]'$  3'[]'$  4'[]'$  UN'[]'$    ?5b Motor Arm - Right 0'[x]'$  1'[]'$  2'[]'$  3'[]'$  4'[]'$  UN'[]'$    ?6a Motor Leg - Left 0'[x]'$  1'[]'$  2'[]'$  3'[]'$  4'[]'$  UN'[]'$    ?6b Motor Leg - Right 0'[x]'$  1'[]'$  2'[]'$  3'[]'$  4'[]'$  UN'[]'$    ?7 Limb Ataxia 0'[x]'$  1'[]'$  2'[]'$   3'[]'$  UN'[]'$     ?8 Sensory 0'[x]'$  1'[]'$  2'[]'$  UN'[]'$      ?9 Best Language 0'[x]'$  1'[]'$  2'[]'$  3'[]'$      ?10 Dysarthria 0'[x]'$  1'[]'$  2'[]'$  UN'[]'$      ?11 Extinct. and Inattention 0'[x]'$  1'[]'$  2'[]'$       ?TOTAL: 0   ? ? ? ? ?  ?ROS  ? ?Constitutional Denies weight loss, fever and chills.   ?HEENT Denies changes in vision and hearing.   ?Respiratory Denies SOB and cough.   ?CV Denies palpitations and CP   ?GI Denies abdominal pain, nausea, vomiting and diarrhea.   ?GU Denies dysuria and urinary frequency.   ?MSK Denies myalgia and joint pain.   ?Skin Denies rash and pruritus.   ?Neurological Denies headache and syncope.   ?Psychiatric Denies recent changes in mood. Denies anxiety and depression.   ? ?Past History  ? ?Past Medical History:  ?Diagnosis Date  ? Ankle fracture   ? Left  ? Anxiety   ? Carotid artery occlusion   ? Closed fracture of left distal fibula 09/16/2017  ? Complication of anesthesia   ? ALLERY TO ESTER BASE  ? Depression   ? early 73s  ?  Diabetes mellitus   ? INSULIN DEPENDENT  ? GERD (gastroesophageal reflux disease)   ? Headache   ? years ago  ? Hypertension   ? Pneumonia   ? Stroke [facility]) March 2015  ?  left MCA infarct, slight weakness on left side  ? Thyroid disease   ? ?Past Surgical History:  ?Procedure Laterality Date  ? ABDOMINAL HYSTERECTOMY    ? ANTERIOR FIXATION AND POSTERIOR MICRODISCECTOMY CERVICAL SPINE  1999  ? CAROTID ENDARTERECTOMY Left 11-04-13  ? cea  ? ENDARTERECTOMY Left 11/04/2013  ? ORIF ANKLE FRACTURE Left 09/16/2017  ? Procedure: OPEN REDUCTION INTERNAL FIXATION (ORIF) LEFT ANKLE FRACTURE;  Surgeon: Marchia Bond, MD;  Location: [place];  Service: Orthopedics;  Laterality: Left;  ? ?Family History  ?Problem Relation Age of Onset  ? Diabetes Mother   ? Heart disease Mother   ?     Before age 84  ? Cancer Father   ?     Lung  ? Hypertension Sister   ? Diabetes Sister   ? Diabetes Sister   ? Diabetes Sister   ? ?Social History  ? ?Socioeconomic History  ? Marital status: Legally Separated  ?  Spouse name: Not on file   ? Number of children: 0  ? Years of education: College  ? Highest education level: Not on file  ?Occupational History  ? Occupation: [employer]  ?  Employer: [employer]  ?Tobacco Use  ? Smoking status: Former  ?  Packs/day: 1.00  ?  Years: 20.00  ?  Pack years: 20.00  ?  Types: Cigarettes  ?  Quit date: 11/01/2013  ?  Years since quitting: 8.0  ? Smokeless tobacco: Never  ?Vaping Use  ? Vaping Use: Never used  ?Substance and Sexual Activity  ? Alcohol use: Not Currently  ?  Alcohol/week: 1.0 - 2.0 standard drink  ?  Types: 1 - 2 Standard drinks or equivalent per week  ? Drug use: No  ? Sexual activity: Not Currently  ?Other Topics Concern  ? Not on file  ?Social History Narrative  ? Patient lives in 1 story home with sister.  ? Caffeine Use: 2 cups daily; sodas occasionally  ? Some college education  ? Works as Oceanographer for [employer]  ? ?Social Determinants of Health  ? ?Financial Resource Strain: Not on file  ?Food Insecurity: No Food Insecurity  ? Worried About Charity fundraiser in the Last Year: Never true  ? Ran Out of Food in the Last Year: Never true  ?Transportation Needs: No Transportation Needs  ? Lack of Transportation (Medical): No  ? Lack of Transportation (Non-Medical): No  ?Physical Activity: Not on file  ?Stress: Stress Concern Present  ? Feeling of Stress : Very much  ?Social Connections: Not on file  ? ?Allergies  ?Allergen Reactions  ? Anesthetics, Ester Anaphylaxis  ? ? ?Medications  ?(Not in a hospital admission) ?  ? ?Vitals  ? ?Vitals:  ? 11/13/21 1630 11/13/21 2020 11/13/21 2338 11/14/21 0322  ?BP: (!) 152/51 (!) 112/41 (!) 140/46 (!) 138/50  ?Pulse: 96 76 81 80  ?Resp: '20 16 20 18  '$ ?Temp:  98.7 ?F (37.1 ?C)    ?TempSrc:  Oral    ?SpO2: 100% 97% 100% 99%  ?Weight:      ?Height:      ?  ? ?Body mass index is 22.86 kg/m?. ? ?Physical Exam  ? ?General: Laying comfortably in bed; in no acute distress.  ?HENT: Normal oropharynx  and mucosa. Normal external appearance of ears and nose.  ?Neck:  Supple, no pain or tenderness  ?CV: No JVD. No peripheral edema.  ?Pulmonary: Symmetric Chest rise. Normal respiratory effort.  ?Abdomen: Soft to touch, non-tender.  ?Ext: No cyanosis, edema, or deformity  ?Skin: No rash. Normal palpation of skin.   ?Musculoskeletal: Normal digits and nails by inspection. No clubbing.  ? ?Neurologic Examination  ?Mental status/Cognition: Alert, oriented to self, place, month and year, good attention.  ?Speech/language: Fluent, comprehension intact, object naming intact, repetition intact.  ?Cranial nerves:  ? CN II Pupils equal and reactive to light, no VF deficits   ? CN III,IV,VI EOM intact, no gaze preference or deviation, no nystagmus   ? CN V normal sensation in V1, V2, and V3 segments bilaterally   ? CN VII no asymmetry, no nasolabial fold flattening   ? CN VIII normal hearing to speech   ? CN IX & X normal palatal elevation, no uvular deviation   ? CN XI 5/5 head turn and 5/5 shoulder shrug bilaterally   ? CN XII midline tongue protrusion   ? ?Motor:  ?Muscle bulk: normal, tone normal, pronator drift none tremor none ?Mvmt Root Nerve  Muscle Right Left Comments  ?SA C5/6 Ax Deltoid 5 5   ?EF C5/6 Mc Biceps 5 5   ?EE C6/7/8 Rad Triceps 5 5   ?WF C6/7 Med FCR     ?WE C7/8 PIN ECU     ?F Ab C8/T1 U ADM/FDI 5 5   ?HF L1/2/3 Fem Illopsoas 5 5   ?KE L2/3/4 Fem Quad 5 5   ?DF L4/5 D Peron Tib Ant 5 5   ?PF S1/2 Tibial Grc/Sol 5 5   ? ?Reflexes: ? Right Left Comments  ?Pectoralis     ? Biceps (C5/6) 2 2   ?Brachioradialis (C5/6) 2 2   ? Triceps (C6/7) 2 2   ? Patellar (L3/4) 2 2   ? Achilles (S1)     ? Hoffman     ? Plantar     ?Jaw jerk   ? ?Sensation: ? Light touch Intact throughout  ? Pin prick   ? Temperature   ? Vibration   ?Proprioception   ? ?Coordination/Complex Motor:  ?- Finger to Nose intact BL ?- Heel to shin intact BL ?- Rapid alternating movement are slowed in LUE. ?- Gait: Deferrred. ? ?Labs  ? ?CBC:  ?Recent Labs  ?Lab 11/13/21 ?1356  ?WBC 10.4  ?NEUTROABS 6.5  ?HGB  11.3*  ?HCT 36.2  ?MCV 88.1  ?PLT 240  ? ? ?Basic Metabolic Panel:  ?Lab Results  ?Component Value Date  ? NA 142 11/13/2021  ? K 4.5 11/13/2021  ? CO2 26 11/13/2021  ? GLUCOSE 90 11/13/2021  ? BUN 24 (H)

## 2021-11-14 NOTE — Progress Notes (Signed)
?PROGRESS NOTE ? ? ? ?Sonia Skinner  WVP:[id number] DOB: Dec 12, 1950 DOA: 11/13/2021 ?PCP: [name], MD  ? ? ?Brief Narrative:  ?Sonia Skinner is a 71 year old female with past medical history significant for type 2 diabetes mellitus, essential hypertension, ICA stenosis s/p left carotid endarterectomy 2015, CKD stage IIIb, GERD, history of CVA who presented to [facility] ED on 3/31 with a visual changes described as "vision white out", feeling of tongue thickening, gait disturbance and speech impairment.  Visual changes onset roughly 3 days prior.  Speech impairment reported lasting about 30 minutes.  Additionally with fatigue and dizziness which prompted her to seek care in the ED. ? ?In the ED, temperature 98.7 ?F, HR 98, RR 20, BP 189/66, SPO2 99% on room air.  WBC 10.4, hemoglobin 11.3, platelets 240. Sodium 142, potassium 4.5, chloride 109, CO2 26, glucose 90, BUN 24, creatinine 2.02.  High sensitive troponin less than 2 x 2.  Urinalysis unrevealing.  CT head without contrast with no acute intracranial findings, noted old infarct left parietal cortex unchanged.  Chest x-ray with no evidence of active cardiopulmonary disease process.  Neurology was consulted.  Patient was transferred to [facility] for further stroke versus TIA work-up.  ? ? ?Assessment & Plan: ?  ?Assessment and Plan: ?* TIA (transient ischemic attack) ?Patient presenting to ED with reported visual changes, tongue thickening, speech impairment, fatigue and dizziness.  CT head without contrast with no acute intracranial findings, old parietal cortex infarct noted.  MR brain no acute infarct, chronic left MCA infarct.  MRA head/neck 70% stenosis proximal cervical left ICA.  TTE with LVEF greater than 75%, no regional wall motion abnormalities, mild LVH, grade 1 diastolic dysfunction, trivial MR, no aortic stenosis, intra-atrial septum not well visualized. ?--Neurology following, appreciate assistance ?--Lipid panel, hemoglobin  A1c: Pending ?--TTE: Pending ?--EEG: Pending ?--DAPT with aspirin 81 mg p.o. daily ?--Brilinta 90 mg p.o. twice daily ?--PT/OT/SLP: Pending ?--Monitor on telemetry ? ?Left carotid stenosis ?MRA with severe 70% stenosis left ICA with atheromatous ulcer, 50-60% stenosis of bilateral carotid siphons. ?--Continue aspirin 81 mg p.o. daily ?--Brilinta 90 mg p.o. twice daily ?--Atorvastatin 80 mg p.o. daily ?--Vascular interventional radiology, Dr. Divers for to evaluate for possible intervention tomorrow ? ?Stage 3b chronic kidney disease ([place]) ?Creatinine 2.02 on admission.  Creatinine 1.4-1.8. ?--Avoid nephrotoxins, renally dose all medications ?--Repeat BMP in a.m. ? ?GERD (gastroesophageal reflux disease) ?On Dexilant 60 mg p.o. daily at home. ?--Continue Protonix 40 mg p.o. daily as hospital substitution ? ?Major depressive disorder, recurrent episode ([place]) ?--Continue Wellbutrin. ? ?Hyperlipidemia with target LDL less than 70 ?--Lipid panel: Pending ?--Atorvastatin 80 mg p.o. daily ?--Zetia 10 mg p.o. daily ? ?Hypothyroidism ?Recent TSH 11.69 on 11/02/2021. ?--Repeat TSH with free T4 ?--Continue home levothyroxine 88 mcg p.o. daily ? ?Type II diabetes mellitus with manifestations ([place]) ?Home regimen includes insulin glargine 30-50 units supposedly daily, Farxiga 10 mg p.o. daily, semaglutide 2 mg every 2 weeks. ?--Hemoglobin A1c: Pending ?--Semglee 15u [place] daily ?--SSI for coverage ?--CBG before every meal/at bedtime ? ?Essential hypertension, benign ?Home regimen includes amlodipine 10 mg p.o. daily. ?--Allow permissive hypertension first 24-48 hours up to 185/105; then will slowly normalize ? ?Mass of left parotid gland ?1.3 cm left parietal lesion noted on MRI head/neck. Will need outpatient ENT referral. ? ? ? ? ?DVT prophylaxis: heparin injection 5,000 Units Start: 11/14/21 0715 ?SCD's Start: 11/14/21 0708 ? ?  Code Status: Full Code ?Family Communication: No family present at  bedside this morning ? ?Disposition  Plan:  ?Level of care: Telemetry Medical ?Status is: Observation ?The patient remains OBS appropriate and will d/c before 2 midnights. ?  ? ?Consultants:  ?Neurology ?Neuro interventional radiology, Dr. [name] ? ?Procedures:  ?TTE ? ?Antimicrobials:  ?none ? ? ?Subjective: ?Patient seen examined bedside, resting comfortably.  No specific complaints this morning.  Tongue fullness and speech impairment continues to be resolved.  Awaiting echocardiogram.  Neurology consulted neuro interventional radiology given ICA stenosis.  No other questions or concerns at this time.  Denies headache, no chest pain, no palpitations, no shortness of breath, no abdominal pain, no focal weakness, no fatigue, no fever/chills/night sweats, no nausea/vomiting/diarrhea, no paresthesias.  No acute events overnight per nursing staff. ? ?Objective: ?Vitals:  ? 11/14/21 0322 11/14/21 0830 11/14/21 1230 11/14/21 1421  ?BP: (!) 138/50 (!) 140/52 (!) 134/51 112/62  ?Pulse: 80 79 83   ?Resp: '18 18 14 16  '$ ?Temp:    98.9 ?F (37.2 ?C)  ?TempSrc:    Oral  ?SpO2: 99% 100% 100%   ?Weight:      ?Height:      ? ?No intake or output data in the 24 hours ending 11/14/21 1438 ?Filed Weights  ? 11/13/21 1334  ?Weight: 56.7 kg  ? ? ?Examination: ? ?Physical Exam: ?GEN: NAD, alert and oriented x 3, wd/wn ?HEENT: NCAT, PERRL, EOMI, sclera clear, MMM ?PULM: CTAB w/o wheezes/crackles, normal respiratory effort, on room air ?CV: RRR w/o M/G/R ?GI: abd soft, NTND, NABS, no R/G/M ?MSK: no peripheral edema, muscle strength globally intact 5/5 bilateral upper/lower extremities ?NEURO: CN II-XII intact, no focal deficits, sensation to light touch intact ?PSYCH: normal mood/affect ?Integumentary: dry/intact, no rashes or wounds ? ? ? ?Data Reviewed: I have personally reviewed following labs and imaging studies ? ?CBC: ?Recent Labs  ?Lab 11/13/21 ?1356  ?WBC 10.4  ?NEUTROABS 6.5  ?HGB 11.3*  ?HCT 36.2  ?MCV 88.1  ?PLT 240  ? ?Basic Metabolic Panel: ?Recent Labs  ?Lab  11/13/21 ?1843  ?NA 142  ?K 4.5  ?CL 109  ?CO2 26  ?GLUCOSE 90  ?BUN 24*  ?CREATININE 2.02*  ?CALCIUM 9.7  ? ?GFR: ?Estimated Creatinine Clearance: 19.9 mL/min (A) (by C-G formula based on SCr of 2.02 mg/dL (H)). ?Liver Function Tests: ?No results for input(s): AST, ALT, ALKPHOS, BILITOT, PROT, ALBUMIN in the last 168 hours. ?No results for input(s): LIPASE, AMYLASE in the last 168 hours. ?No results for input(s): AMMONIA in the last 168 hours. ?Coagulation Profile: ?No results for input(s): INR, PROTIME in the last 168 hours. ?Cardiac Enzymes: ?No results for input(s): CKTOTAL, CKMB, CKMBINDEX, TROPONINI in the last 168 hours. ?BNP (last 3 results) ?No results for input(s): PROBNP in the last 8760 hours. ?HbA1C: ?No results for input(s): HGBA1C in the last 72 hours. ?CBG: ?Recent Labs  ?Lab 11/13/21 ?1341 11/14/21 ?1225  ?GLUCAP 121* 190*  ? ?Lipid Profile: ?No results for input(s): CHOL, HDL, LDLCALC, TRIG, CHOLHDL, LDLDIRECT in the last 72 hours. ?Thyroid Function Tests: ?No results for input(s): TSH, T4TOTAL, FREET4, T3FREE, THYROIDAB in the last 72 hours. ?Anemia Panel: ?No results for input(s): VITAMINB12, FOLATE, FERRITIN, TIBC, IRON, RETICCTPCT in the last 72 hours. ?Sepsis Labs: ?No results for input(s): PROCALCITON, LATICACIDVEN in the last 168 hours. ? ?No results found for this or any previous visit (from the past 240 hour(s)).  ? ? ? ? ? ?Radiology Studies: ?DG Chest 2 View ? ?Result Date: 11/13/2021 ?CLINICAL DATA:  Dizziness with blurred vision and lethargy  for 24 hours. EXAM: CHEST - 2 VIEW COMPARISON:  Radiographs 12/11/2016 and 06/12/2015. FINDINGS: The heart size and mediastinal contours are normal. The lungs are clear. There is no pleural effusion or pneumothorax. No acute osseous findings are identified. Stable degenerative changes throughout the spine. Telemetry leads overlie the chest. IMPRESSION: No evidence of active cardiopulmonary process. Electronically Signed   By: Richardean Sale M.D.    On: 11/13/2021 14:38  ? ?CT Head Wo Contrast ? ?Result Date: 11/13/2021 ?CLINICAL DATA:  Dizziness, blurred vision EXAM: CT HEAD WITHOUT CONTRAST TECHNIQUE: Contiguous axial images were obtained from th

## 2021-11-14 NOTE — H&P (Signed)
?History and Physical  ? ? ?Sonia Skinner CWU:[id number] DOB: 05-11-1951 DOA: 11/13/2021 ? ?DOS: the patient was seen and examined on 11/13/2021 ? ?PCP: [name], MD  ? ?Patient coming from: Home ? ?I have personally briefly reviewed patient's old medical records in [place] ? ?CC: possible CVA ?HPI: ?71 year old African-American female history of type 2 diabetes, hypertension, prior left-sided stroke, history of left carotid endarterectomy in 2015, reflux, CKD stage IIIb presents as a transfer from [place].  Patient states that 3 days ago, she was driving on the road, she states that he had vision whiteout.  She had to pull over the road.  She states she end up going to her house and took a nap.  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  This lasted about 30 minutes.  She also states that she has been feeling off balance during these times. ? ?Today she has felt very tired and dizzy and decided to come to the hospital. ? ?Initial evaluation at [facility] was negative for stroke.  Neurology was consulted and recommended patient be transferred to [facility] for MRI testing. ? ? ? ?MRI brain was performed which was negative for acute stroke. ? ?MRA of the head neck showed a 70% stenosis of the proximal cervical ICA.  There is also erythematous changes of the bilateral carotid siphons and associated stenosis of up to 50%. ? ?Note the patient did have a carotid endarterectomy in 2015 with a clot noted at the carotid bifurcation. ? ?Patient was seen by neurology who still wanted patient admitted to the hospital.  ? ?ED Course: Initial head CT was negative.  Patient transferred to [facility] for MRI.  MRI brain negative.  Neurology has seen the patient recommended admission. ? ?Review of  Systems:  ?Review of Systems  ?Constitutional: Negative.   ?HENT: Negative.    ?Eyes:  Positive for blurred vision.  ?Respiratory: Negative.    ?Cardiovascular: Negative.   ?Gastrointestinal: Negative.   ?Genitourinary: Negative.   ?Musculoskeletal: Negative.   ?Skin: Negative.   ?Neurological:  Positive for dizziness, tingling, speech change and focal weakness.  ?Endo/Heme/Allergies: Negative.   ?Psychiatric/Behavioral: Negative.    ? ?Past Medical History:  ?Diagnosis Date  ? Ankle fracture   ? Left  ? Anxiety   ? Carotid artery occlusion   ? Closed fracture of left distal fibula 09/16/2017  ? Complication of anesthesia   ? ALLERY TO ESTER BASE  ? Depression   ? early 76s  ? Diabetes mellitus   ? INSULIN DEPENDENT  ? GERD (gastroesophageal reflux disease)   ? Headache   ? years ago  ? Hypertension   ? Pneumonia   ? Stroke [facility] March 2015  ?  left MCA infarct, slight weakness on left side  ? Thyroid disease   ? ? ?Past Surgical History:  ?Procedure Laterality Date  ? ABDOMINAL HYSTERECTOMY    ? ANTERIOR FIXATION AND POSTERIOR MICRODISCECTOMY CERVICAL SPINE  1999  ? CAROTID ENDARTERECTOMY Left 11-04-13  ? cea  ? ENDARTERECTOMY Left 11/04/2013  ? ORIF ANKLE FRACTURE Left 09/16/2017  ? Procedure: OPEN REDUCTION INTERNAL FIXATION (ORIF) LEFT ANKLE FRACTURE;  Surgeon: Marchia Bond, MD;  Location: [facility];  Service: Orthopedics;  Laterality: Left;  ? ? ? reports that she quit smoking about 8 years ago. Her smoking use included cigarettes. She has a 20.00 pack-year smoking history. She has never used smokeless tobacco. She reports that she does not currently use alcohol after a past usage of about 1.0 - 2.0 standard drink per week. She reports that she does not use drugs. ? ?Allergies  ?Allergen Reactions  ? Anesthetics, Ester Anaphylaxis  ? ? ?Family History  ?Problem Relation Age of Onset  ? Diabetes Mother   ? Heart disease Mother   ?     Before age 32  ? Cancer Father   ?     Lung  ? Hypertension Sister   ? Diabetes Sister    ? Diabetes Sister   ? Diabetes Sister   ? ? ?Prior to Admission medications   ?Medication Sig Start Date End Date Taking? Authorizing Provider  ?Accu-Chek FastClix Lancets MISC Use to check blood sugar five times daily. 04/21/21  Yes [name], MD  ?acetaminophen (TYLENOL) 500 MG tablet Take 1,000-1,500 mg by mouth every 6 (six) hours as needed for mild pain.   Yes [provider]  ?Acetaminophen-Codeine (TYLENOL/CODEINE #3) 300-30 MG tablet Take 1 tablet by mouth every 6 (six) hours as needed for pain. 10/05/21  Yes Biagio Borg, MD  ?amitriptyline (ELAVIL) 100 MG tablet Take 1 tablet (100 mg total) by mouth at bedtime. ?Patient taking differently: Take 100 mg by mouth at bedtime as needed for sleep. 08/28/21  Yes Arfeen, Arlyce Harman, MD  ?amLODipine (NORVASC) 5 MG tablet Take 1 tablet (5 mg total) by mouth daily. 08/26/21  Yes Freada Bergeron, MD  ?aspirin EC 325 MG tablet Take 1 tablet (325 mg total) by mouth daily. 09/05/21 09/05/22 Yes Vashti Hey, MD  ?atorvastatin (LIPITOR) 40 MG tablet Take 1 tablet by mouth daily. ?Patient taking differently: Take 40 mg by mouth at bedtime. 09/19/21  Yes [name], MD  ?blood glucose meter kit and supplies KIT Use to test blood sugars 04/27/19  Yes [name], MD  ?buPROPion (WELLBUTRIN XL) 300 MG 24 hr tablet Take 1 tablet (300 mg total) by mouth daily. 08/28/21  Yes Arfeen, Arlyce Harman, MD  ?Cholecalciferol (VITAMIN D3 PO) Take 1 tablet by mouth daily. Unsure of dose   Yes [provider]  ?dexlansoprazole (DEXILANT) 60 MG capsule Take 1 capsule (60 mg total) by mouth daily. 08/20/21  Yes [name], MD  ?EDARBYCLOR 40-12.5 MG TABS Take 1 tablet by mouth daily. 09/19/21  Yes [name], MD  ?Wilder [place]ade 10 MG TABS tablet Take 1 tablet by mouth daily before breakfast. 09/19/21  Yes [name], MD  ?gabapentin (NEURONTIN) 100 MG capsule Take 1 capsule (100 mg total) by mouth 3 (three) times daily. ?Patient taking differently: Take  100-200 mg by mouth See admin instructions. 200 mg int the morning ?100 mg at bedtime 10/05/21  Yes Biagio Borg, MD  ?glucose blood (ACCU-CHEK GUIDE) test strip Use to check blood sugars five times daily. 04/21/21  Yes [name], MD  ?Insulin Glargine (BASAGLAR KWIKPEN [place]) Inject 30-50 Units into the skin daily. 30-35 units if below 150 ?50 units if over 150   Yes [provider]  ?Insulin Pen Needle 32G X 4 MM MISC Use to administer insulin four times a day 04/02/20  Yes [name], MD  ?levothyroxine (SYNTHROID) 88 MCG tablet Take 1 tablet (88 mcg total) by mouth daily.  08/27/21  Yes [name], MD  ?loratadine (CLARITIN) 10 MG tablet Take 1 tablet (10 mg total) by mouth daily. 04/21/21  Yes [name], MD  ?Semaglutide, 2 MG/DOSE, 8 MG/3ML SOPN Inject 2 mg as directed once a week. ?Patient taking differently: Inject 2 mg as directed every 14 (fourteen) days. 08/28/21  Yes [name], MD  ?Continuous Blood Gluc Receiver (DEXCOM G6 RECEIVER) DEVI 1 Act by Does not apply route daily. 08/27/21   [name], MD  ?Continuous Blood Gluc Sensor (DEXCOM G6 SENSOR) MISC 1 Act by Does not apply route daily. 08/27/21   [name], MD  ?Continuous Blood Gluc Transmit (DEXCOM G6 TRANSMITTER) MISC 1 Act by Does not apply route daily. 08/27/21   [name], MD  ?Insulin NPH, Human,, Isophane, (NOVOLIN N FLEXPEN) 100 UNIT/ML Kiwkpen Inject 35 Units into the skin every morning. And pen needles 1/day ?Patient not taking: Reported on 11/14/2021 11/02/21   Renato Shin, MD  ? ? ?Physical Exam: ?Vitals:  ? 11/13/21 1630 11/13/21 2020 11/13/21 2338 11/14/21 0322  ?BP: (!) 152/51 (!) 112/41 (!) 140/46 (!) 138/50  ?Pulse: 96 76 81 80  ?Resp: $Remov'20 16 20 18  'xTRUnV$ ?Temp:  98.7 ?F (37.1 ?C)    ?TempSrc:  Oral    ?SpO2: 100% 97% 100% 99%  ?Weight:      ?Height:      ? ? ?Physical Exam ?Vitals and nursing note reviewed.  ?Constitutional:   ?   General: She is not in acute distress. ?   Appearance: Normal  appearance. She is normal weight. She is not ill-appearing, toxic-appearing or diaphoretic.  ?HENT:  ?   Head: Normocephalic and atraumatic.  ?Eyes:  ?   General: No scleral icterus. ?Neck:  ?   Comments: We

## 2021-11-14 NOTE — Assessment & Plan Note (Addendum)
Patient presenting to ED with reported visual changes, tongue thickening, speech impairment, fatigue and dizziness.  CT head without contrast with no acute intracranial findings, old parietal cortex infarct noted.  MR brain no acute infarct, chronic left MCA infarct.  MRA head/neck 70% stenosis proximal cervical left ICA.  TTE with LVEF greater than 75%, no regional wall motion abnormalities, mild LVH, grade 1 diastolic dysfunction, trivial MR, no aortic stenosis, intra-atrial septum not well visualized.  Hemoglobin A1c 7.8.  LDL 101.  EEG with no seizures or epileptiform discharges. ?--Neurology following, appreciate assistance ?--DAPT with aspirin 81 mg p.o. daily and Brilinta 90 mg p.o. twice daily ?--PT/OT with no follow-up recommended ?--Monitor on telemetry ?

## 2021-11-14 NOTE — Assessment & Plan Note (Addendum)
Creatinine 2.02 on admission.  Creatinine 1.4-1.8. ?--Cr 2.02>2.28>2.13>1.81 ?--Avoid nephrotoxins, renally dose all medications ?--Repeat BMP in a.m. ?

## 2021-11-14 NOTE — Assessment & Plan Note (Addendum)
MRA with severe 70% stenosis left ICA with atheromatous ulcer, 50-60% stenosis of bilateral carotid siphons.  Underwent diagnostic cerebral angiogram 4/3 with findings 75-80% stenosis left ICA proximal, 50% stenosis right ICA, 70% stenosis left vertebral artery, 40% stenosis left ICA proximal cavernous segment ?--Continue aspirin 81 mg p.o. daily ?--Brilinta 90 mg p.o. twice daily ?--Atorvastatin 80 mg p.o. daily ?--Pending left carotid angioplasty with possible stent placement 4/6 by Dr. [name] ?

## 2021-11-14 NOTE — Assessment & Plan Note (Addendum)
On Dexilant 60 mg p.o. daily at home. ?--Continue Protonix 40 mg p.o. daily as hospital substitution ?

## 2021-11-14 NOTE — Subjective & Objective (Signed)
CC: possible CVA ?HPI: ?71 year old African-American female history of type 2 diabetes, hypertension, prior left-sided stroke, history of left carotid endarterectomy in 2015, reflux, CKD stage IIIb presents as a transfer from [place].  Patient states that 3 days ago, she was driving on the road, she states that he had vision whiteout.  She had to pull over the road.  She states she end up going to her house and took a nap.  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  This lasted about 30 minutes.  She also states that she has been feeling off balance during these times. ? ?Today she has felt very tired and dizzy and decided to come to the hospital. ? ?Initial evaluation at [facility] was negative for stroke.  Neurology was consulted and recommended patient be transferred to [facility] for MRI testing. ? ? ? ?MRI brain was performed which was negative for acute stroke. ? ?MRA of the head neck showed a 70% stenosis of the proximal cervical ICA.  There is also erythematous changes of the bilateral carotid siphons and associated stenosis of up to 50%. ? ?Note the patient did have a carotid endarterectomy in 2015 with a clot noted at the carotid bifurcation. ? ?Patient was seen by neurology who still wanted patient admitted to the hospital. ?

## 2021-11-14 NOTE — Assessment & Plan Note (Addendum)
-   Continue Wellbutrin 

## 2021-11-15 DIAGNOSIS — I69354 Hemiplegia and hemiparesis following cerebral infarction affecting left non-dominant side: Secondary | ICD-10-CM | POA: Diagnosis not present

## 2021-11-15 DIAGNOSIS — E785 Hyperlipidemia, unspecified: Secondary | ICD-10-CM | POA: Diagnosis not present

## 2021-11-15 DIAGNOSIS — R29818 Other symptoms and signs involving the nervous system: Secondary | ICD-10-CM | POA: Diagnosis not present

## 2021-11-15 DIAGNOSIS — E039 Hypothyroidism, unspecified: Secondary | ICD-10-CM | POA: Diagnosis present

## 2021-11-15 DIAGNOSIS — D631 Anemia in chronic kidney disease: Secondary | ICD-10-CM | POA: Diagnosis not present

## 2021-11-15 DIAGNOSIS — E1165 Type 2 diabetes mellitus with hyperglycemia: Secondary | ICD-10-CM | POA: Diagnosis present

## 2021-11-15 DIAGNOSIS — F339 Major depressive disorder, recurrent, unspecified: Secondary | ICD-10-CM | POA: Diagnosis present

## 2021-11-15 DIAGNOSIS — Z7984 Long term (current) use of oral hypoglycemic drugs: Secondary | ICD-10-CM | POA: Diagnosis not present

## 2021-11-15 DIAGNOSIS — Z8249 Family history of ischemic heart disease and other diseases of the circulatory system: Secondary | ICD-10-CM | POA: Diagnosis not present

## 2021-11-15 DIAGNOSIS — I6523 Occlusion and stenosis of bilateral carotid arteries: Secondary | ICD-10-CM | POA: Diagnosis not present

## 2021-11-15 DIAGNOSIS — I129 Hypertensive chronic kidney disease with stage 1 through stage 4 chronic kidney disease, or unspecified chronic kidney disease: Secondary | ICD-10-CM | POA: Diagnosis not present

## 2021-11-15 DIAGNOSIS — I6502 Occlusion and stenosis of left vertebral artery: Secondary | ICD-10-CM | POA: Diagnosis not present

## 2021-11-15 DIAGNOSIS — Z801 Family history of malignant neoplasm of trachea, bronchus and lung: Secondary | ICD-10-CM | POA: Diagnosis not present

## 2021-11-15 DIAGNOSIS — Z884 Allergy status to anesthetic agent status: Secondary | ICD-10-CM | POA: Diagnosis not present

## 2021-11-15 DIAGNOSIS — Z87891 Personal history of nicotine dependence: Secondary | ICD-10-CM | POA: Diagnosis not present

## 2021-11-15 DIAGNOSIS — N1832 Chronic kidney disease, stage 3b: Secondary | ICD-10-CM | POA: Diagnosis not present

## 2021-11-15 DIAGNOSIS — Z7982 Long term (current) use of aspirin: Secondary | ICD-10-CM | POA: Diagnosis not present

## 2021-11-15 DIAGNOSIS — Z95820 Peripheral vascular angioplasty status with implants and grafts: Secondary | ICD-10-CM | POA: Diagnosis not present

## 2021-11-15 DIAGNOSIS — N189 Chronic kidney disease, unspecified: Secondary | ICD-10-CM | POA: Diagnosis not present

## 2021-11-15 DIAGNOSIS — K219 Gastro-esophageal reflux disease without esophagitis: Secondary | ICD-10-CM | POA: Diagnosis present

## 2021-11-15 DIAGNOSIS — Z79899 Other long term (current) drug therapy: Secondary | ICD-10-CM | POA: Diagnosis not present

## 2021-11-15 DIAGNOSIS — E1122 Type 2 diabetes mellitus with diabetic chronic kidney disease: Secondary | ICD-10-CM | POA: Diagnosis not present

## 2021-11-15 DIAGNOSIS — Z833 Family history of diabetes mellitus: Secondary | ICD-10-CM | POA: Diagnosis not present

## 2021-11-15 DIAGNOSIS — G459 Transient cerebral ischemic attack, unspecified: Secondary | ICD-10-CM | POA: Diagnosis not present

## 2021-11-15 DIAGNOSIS — I2510 Atherosclerotic heart disease of native coronary artery without angina pectoris: Secondary | ICD-10-CM | POA: Diagnosis present

## 2021-11-15 DIAGNOSIS — Z79890 Hormone replacement therapy (postmenopausal): Secondary | ICD-10-CM | POA: Diagnosis not present

## 2021-11-15 DIAGNOSIS — I63232 Cerebral infarction due to unspecified occlusion or stenosis of left carotid arteries: Secondary | ICD-10-CM | POA: Diagnosis not present

## 2021-11-15 DIAGNOSIS — I10 Essential (primary) hypertension: Secondary | ICD-10-CM | POA: Diagnosis not present

## 2021-11-15 DIAGNOSIS — Z794 Long term (current) use of insulin: Secondary | ICD-10-CM | POA: Diagnosis not present

## 2021-11-15 DIAGNOSIS — I6522 Occlusion and stenosis of left carotid artery: Secondary | ICD-10-CM | POA: Diagnosis not present

## 2021-11-15 LAB — GLUCOSE, CAPILLARY
Glucose-Capillary: 106 mg/dL — ABNORMAL HIGH (ref 70–99)
Glucose-Capillary: 138 mg/dL — ABNORMAL HIGH (ref 70–99)
Glucose-Capillary: 160 mg/dL — ABNORMAL HIGH (ref 70–99)
Glucose-Capillary: 170 mg/dL — ABNORMAL HIGH (ref 70–99)

## 2021-11-15 LAB — BASIC METABOLIC PANEL
Anion gap: 8 (ref 5–15)
BUN: 27 mg/dL — ABNORMAL HIGH (ref 8–23)
CO2: 24 mmol/L (ref 22–32)
Calcium: 9 mg/dL (ref 8.9–10.3)
Chloride: 106 mmol/L (ref 98–111)
Creatinine, Ser: 2.28 mg/dL — ABNORMAL HIGH (ref 0.44–1.00)
GFR, Estimated: 22 mL/min — ABNORMAL LOW (ref >60)
Glucose, Bld: 215 mg/dL — ABNORMAL HIGH (ref 70–99)
Potassium: 4.1 mmol/L (ref 3.5–5.1)
Sodium: 138 mmol/L (ref 135–145)

## 2021-11-15 MED ORDER — SODIUM CHLORIDE 0.9 % IV SOLN: INTRAVENOUS | Status: AC

## 2021-11-15 NOTE — Progress Notes (Signed)
?PROGRESS NOTE ? ? ? ?LUV MISH  BOF:[id number] DOB: 09/28/1950 DOA: 11/13/2021 ?PCP: [name], MD  ? ? ?Brief Narrative:  ?Sonia Skinner is a 71 year old female with past medical history significant for type 2 diabetes mellitus, essential hypertension, ICA stenosis s/p left carotid endarterectomy 2015, CKD stage IIIb, GERD, history of CVA who presented to [facility] ED on 3/31 with a visual changes described as "vision white out", feeling of tongue thickening, gait disturbance and speech impairment.  Visual changes onset roughly 3 days prior.  Speech impairment reported lasting about 30 minutes.  Additionally with fatigue and dizziness which prompted her to seek care in the ED. ? ?In the ED, temperature 98.7 ?F, HR 98, RR 20, BP 189/66, SPO2 99% on room air.  WBC 10.4, hemoglobin 11.3, platelets 240. Sodium 142, potassium 4.5, chloride 109, CO2 26, glucose 90, BUN 24, creatinine 2.02.  High sensitive troponin less than 2 x 2.  Urinalysis unrevealing.  CT head without contrast with no acute intracranial findings, noted old infarct left parietal cortex unchanged.  Chest x-ray with no evidence of active cardiopulmonary disease process.  Neurology was consulted.  Patient was transferred to [facility] for further stroke versus TIA work-up.  ? ? ?  ?Assessment and Plan: ?* TIA (transient ischemic attack) ?Patient presenting to ED with reported visual changes, tongue thickening, speech impairment, fatigue and dizziness.  CT head without contrast with no acute intracranial findings, old parietal cortex infarct noted.  MR brain no acute infarct, chronic left MCA infarct.  MRA head/neck 70% stenosis proximal cervical left ICA.  TTE with LVEF greater than 75%, no regional wall motion abnormalities, mild LVH, grade 1 diastolic dysfunction, trivial MR, no aortic stenosis, intra-atrial septum not well visualized.  Hemoglobin A1c 7.8.  LDL 101.  EEG with no seizures or epileptiform discharges. ?--Neurology  following, appreciate assistance ?--DAPT with aspirin 81 mg p.o. daily and Brilinta 90 mg p.o. twice daily ?--PT with no follow-up recommended ?--OT: Pending ?--Monitor on telemetry ? ?Left carotid stenosis ?MRA with severe 70% stenosis left ICA with atheromatous ulcer, 50-60% stenosis of bilateral carotid siphons. ?--Continue aspirin 81 mg p.o. daily ?--Brilinta 90 mg p.o. twice daily ?--Atorvastatin 80 mg p.o. daily ?-- Awaiting vascular interventional radiology evaluation for possible need of intervention left ICA stenosis ? ?Stage 3b chronic kidney disease ([place]) ?Creatinine 2.02 on admission.  Creatinine 1.4-1.8. ?--Cr 2.02>2.28 ?--NS at 75 mL/h ?--Avoid nephrotoxins, renally dose all medications ?--Repeat BMP in a.m. ? ?GERD (gastroesophageal reflux disease) ?On Dexilant 60 mg p.o. daily at home. ?--Continue Protonix 40 mg p.o. daily as hospital substitution ? ?Major depressive disorder, recurrent episode ([place]) ?--Continue Wellbutrin. ? ?Hyperlipidemia with target LDL less than 70 ?Lipid panel with total cholesterol 183, LDL 101, triglycerides 198, HDL 42. ?--Atorvastatin 80 mg p.o. daily ?--Zetia 10 mg p.o. daily ? ?Hypothyroidism ?TSH 1.088, free T4 1.13. ?--Continue home levothyroxine 88 mcg p.o. daily ? ?Type II diabetes mellitus with manifestations ([place]) ?Home regimen includes insulin glargine 30-50 units supposedly daily, Farxiga 10 mg p.o. daily, semaglutide 2 mg every 2 weeks. ?--Hemoglobin A1c: 7.8 ?--Semglee 15u Singer daily ?--SSI for coverage ?--CBG before every meal/at bedtime ? ?Essential hypertension, benign ?Home regimen includes amlodipine 10 mg p.o. daily. ?--Allow permissive hypertension first 24-48 hours up to 185/105; then will slowly normalize ? ?Mass of left parotid gland ?1.3 cm left parietal lesion noted on MRI head/neck. Will need outpatient ENT referral. ? ? ? ? ?DVT prophylaxis: heparin injection 5,000  Units Start: 11/14/21 0715 ?SCD's Start: 11/14/21 0708 ? ?  Code Status: Full  Code ?Family Communication: No family present at bedside this morning ? ?Disposition Plan:  ?Level of care: Telemetry Medical ?Status is: Observation ?The patient remains OBS appropriate and will d/c before 2 midnights. ?  ? ?Consultants:  ?Neurology ?Neuro interventional radiology, Dr. [name] ? ?Procedures:  ?TTE ? ?Antimicrobials:  ?none ? ? ?Subjective: ?Patient seen examined bedside, resting comfortably.  No specific complaints this morning.  Visual changes have resolved.  Awaiting for neuro interventional radiology evaluation for severe left ICA stenosis.  Denies headache, no chest pain, no palpitations, no shortness of breath, no abdominal pain, no focal weakness, no fatigue, no fever/chills/night sweats, no nausea/vomiting/diarrhea, no paresthesias.  No acute events overnight per nursing staff. ? ?Objective: ?Vitals:  ? 11/14/21 2059 11/15/21 0008 11/15/21 0400 11/15/21 0815  ?BP: (!) 145/54 (!) 133/56 (!) 129/50 (!) 121/98  ?Pulse: 83 85 87   ?Resp: '18 17 19   '$ ?Temp: 98.4 ?F (36.9 ?C) 98.3 ?F (36.8 ?C) 98.1 ?F (36.7 ?C) 98.4 ?F (36.9 ?C)  ?TempSrc: Oral Oral Oral Oral  ?SpO2: 100% 97% 97%   ?Weight:      ?Height:      ? ? ?Intake/Output Summary (Last 24 hours) at 11/15/2021 1106 ?Last data filed at 11/14/2021 1800 ?Gross per 24 hour  ?Intake 240 ml  ?Output --  ?Net 240 ml  ? ?Filed Weights  ? 11/13/21 1334  ?Weight: 56.7 kg  ? ? ?Examination: ? ?Physical Exam: ?GEN: NAD, alert and oriented x 3, wd/wn ?HEENT: NCAT, PERRL, EOMI, sclera clear, MMM ?PULM: CTAB w/o wheezes/crackles, normal respiratory effort, on room air ?CV: RRR w/o M/G/R ?GI: abd soft, NTND, NABS, no R/G/M ?MSK: no peripheral edema, muscle strength globally intact 5/5 bilateral upper/lower extremities ?NEURO: CN II-XII intact, no focal deficits, sensation to light touch intact ?PSYCH: normal mood/affect ?Integumentary: dry/intact, no rashes or wounds ? ? ? ?Data Reviewed: I have personally reviewed following labs and imaging  studies ? ?CBC: ?Recent Labs  ?Lab 11/13/21 ?1356  ?WBC 10.4  ?NEUTROABS 6.5  ?HGB 11.3*  ?HCT 36.2  ?MCV 88.1  ?PLT 240  ? ?Basic Metabolic Panel: ?Recent Labs  ?Lab 11/13/21 ?1843 11/15/21 ?0139  ?NA 142 138  ?K 4.5 4.1  ?CL 109 106  ?CO2 26 24  ?GLUCOSE 90 215*  ?BUN 24* 27*  ?CREATININE 2.02* 2.28*  ?CALCIUM 9.7 9.0  ? ?GFR: ?Estimated Creatinine Clearance: 17.6 mL/min (A) (by C-G formula based on SCr of 2.28 mg/dL (H)). ?Liver Function Tests: ?No results for input(s): AST, ALT, ALKPHOS, BILITOT, PROT, ALBUMIN in the last 168 hours. ?No results for input(s): LIPASE, AMYLASE in the last 168 hours. ?No results for input(s): AMMONIA in the last 168 hours. ?Coagulation Profile: ?No results for input(s): INR, PROTIME in the last 168 hours. ?Cardiac Enzymes: ?No results for input(s): CKTOTAL, CKMB, CKMBINDEX, TROPONINI in the last 168 hours. ?BNP (last 3 results) ?No results for input(s): PROBNP in the last 8760 hours. ?HbA1C: ?Recent Labs  ?  11/14/21 ?1434  ?HGBA1C 7.8*  ? ?CBG: ?Recent Labs  ?Lab 11/13/21 ?1341 11/14/21 ?1225 11/14/21 ?1602 11/14/21 ?2105 11/15/21 ?0818  ?GLUCAP 121* 190* 259* 99 106*  ? ?Lipid Profile: ?Recent Labs  ?  11/14/21 ?1434  ?CHOL 183  ?HDL 42  ?LDLCALC 101*  ?TRIG 198*  ?CHOLHDL 4.4  ? ?Thyroid Function Tests: ?Recent Labs  ?  11/14/21 ?1434  ?TSH 1.088  ?FREET4 1.13*  ? ?Anemia Panel: ?  No results for input(s): VITAMINB12, FOLATE, FERRITIN, TIBC, IRON, RETICCTPCT in the last 72 hours. ?Sepsis Labs: ?No results for input(s): PROCALCITON, LATICACIDVEN in the last 168 hours. ? ?No results found for this or any previous visit (from the past 240 hour(s)).  ? ? ? ? ? ?Radiology Studies: ?DG Chest 2 View ? ?Result Date: 11/13/2021 ?CLINICAL DATA:  Dizziness with blurred vision and lethargy for 24 hours. EXAM: CHEST - 2 VIEW COMPARISON:  Radiographs 12/11/2016 and 06/12/2015. FINDINGS: The heart size and mediastinal contours are normal. The lungs are clear. There is no pleural effusion or  pneumothorax. No acute osseous findings are identified. Stable degenerative changes throughout the spine. Telemetry leads overlie the chest. IMPRESSION: No evidence of active cardiopulmonary process. Electronically Signed   By: Aletha Halim

## 2021-11-15 NOTE — Evaluation (Signed)
Occupational Therapy Evaluation ?Patient Details ?Name: Sonia Skinner ?MRN: [id number] ?DOB: 1950-11-04 ?Today's Date: 11/15/2021 ? ? ?History of Present Illness Pt is a 71yo female presenting with "vision whiteout" while driving then felt that her tongue was thick and unable to get words out MRI and CT negative for stroke. MRA showed 70% stenosis of proximal cervical ICA. PMH: DM, L MCA 10/2010, thyroid disease, PSH: 10/2013 carotid endarterectomy.  ? ?Clinical Impression ?  ?Pt admitted for concerns listed above. 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 At this time, pt presents at her baseline with no difficulties completing ADL's or functional mobility. Vision and cognition are [facility]. Pt has no further OT needs and acute OT will sign off.   ?   ? ?Recommendations for follow up therapy are one component of a multi-disciplinary discharge planning process, led by the attending physician.  Recommendations may be updated based on patient status, additional functional criteria and insurance authorization.  ? ?Follow Up Recommendations ? No OT follow up  ?  ?Assistance Recommended at Discharge None  ?Patient can return home with the following   ? ?  ?Functional Status Assessment ? Patient has had a recent decline in their functional status and demonstrates the ability to make significant improvements in function in a reasonable and predictable amount of time.  ?Equipment Recommendations ? None recommended by OT  ?  ?Recommendations for Other Services   ? ? ?  ?Precautions / Restrictions Precautions ?Precautions: None ?Restrictions ?Weight Bearing Restrictions: No  ? ?  ? ?Mobility Bed Mobility ?Overal bed mobility: Independent ?  ?  ?  ?  ?  ?  ?  ?  ? ?Transfers ?Overall transfer level: Independent ?Equipment used: None ?  ?  ?  ?  ?  ?  ?  ?  ?  ? ?  ?Balance Overall balance assessment: No apparent balance deficits (not formally assessed) ?  ?  ?   ?  ?  ?  ?  ?  ?  ?  ?  ?  ?  ?  ?  ?  ?  ?  ?   ? ?ADL either performed or assessed with clinical judgement  ? ?ADL Overall ADL's : At baseline;Independent ?  ?  ?  ?  ?  ?  ?  ?  ?  ?  ?  ?  ?  ?  ?  ?  ?  ?  ?  ?General ADL Comments: Indep with all ADL's no concerns  ? ? ? ?Vision Baseline Vision/History: 0 No visual deficits ?Ability to See in Adequate Light: 0 Adequate ?Patient Visual Report: No change from baseline ?Vision Assessment?: No apparent visual deficits  ?   ?Perception   ?  ?Praxis   ?  ? ?Pertinent Vitals/Pain Pain Assessment ?Pain Assessment: No/denies pain  ? ? ? ?Hand Dominance Left ?  ?Extremity/Trunk Assessment Upper Extremity Assessment ?Upper Extremity Assessment: Overall WFL for tasks assessed ?  ?Lower Extremity Assessment ?Lower Extremity Assessment: Overall WFL for tasks assessed ?  ?Cervical / Trunk Assessment ?Cervical / Trunk Assessment: Normal ?  ?Communication Communication ?Communication: No difficulties ?  ?Cognition Arousal/Alertness: Awake/alert ?Behavior During Therapy: [facility] for tasks assessed/performed ?Overall Cognitive Status: Within Functional Limits for tasks assessed ?  ?  ?  ?  ?  ?  ?  ?  ?  ?  ?  ?  ?  ?  ?  ?  ?  ?  ?  ?  General Comments  VSS on RA, pt reporting frustration due to not knowing her test results ? ?  ?Exercises   ?  ?Shoulder Instructions    ? ? ?Home Living Family/patient expects to be discharged to:: Private residence ?Living Arrangements: Other relatives (sister) ?Available Help at Discharge: Family;Available 24 hours/day ?Type of Home: House ?Home Access: Level entry ?  ?  ?Home Layout: One level ?  ?  ?Bathroom Shower/Tub: Tub/shower unit ?  ?Bathroom Toilet: Standard ?  ?  ?Home Equipment: None ?  ?  ?  ? ?  ?Prior Functioning/Environment Prior Level of Function : Independent/Modified Independent;Working/employed;Driving ?  ?  ?  ?  ?  ?  ?Mobility Comments: indep, works as a Oceanographer ?ADLs Comments: indep ?  ? ?  ?  ?OT Problem List:  Impaired balance (sitting and/or standing);Impaired vision/perception ?  ?   ?OT Treatment/Interventions:    ?  ?OT Goals(Current goals can be found in the care plan section) Acute Rehab OT Goals ?Patient Stated Goal: To go home ?OT Goal Formulation: All assessment and education complete, DC therapy ?Time For Goal Achievement: 11/29/21 ?Potential to Achieve Goals: Good  ?OT Frequency:   ?  ? ?Co-evaluation   ?  ?  ?  ?  ? ?  ?AM-PAC OT "6 Clicks" Daily Activity     ?Outcome Measure Help from another person eating meals?: None ?Help from another person taking care of personal grooming?: None ?Help from another person toileting, which includes using toliet, bedpan, or urinal?: None ?Help from another person bathing (including washing, rinsing, drying)?: None ?Help from another person to put on and taking off regular upper body clothing?: None ?Help from another person to put on and taking off regular lower body clothing?: None ?6 Click Score: 24 ?  ?End of Session Nurse Communication: Mobility status ? ?Activity Tolerance: Patient tolerated treatment well ?Patient left: in bed;with call bell/phone within reach ? ?OT Visit Diagnosis: Unsteadiness on feet (R26.81);Other abnormalities of gait and mobility (R26.89);Muscle weakness (generalized) (M62.81)  ?              ?Time: 0277-4128 ?OT Time Calculation (min): 20 min ?Charges:  OT General Charges ?$OT Visit: 1 Visit ?OT Evaluation ?$OT Eval Moderate Complexity: 1 Mod ? ?Kary Sugrue H., OTR/L ?Acute Rehabilitation ? ?Sonia Skinner ?11/15/2021, 3:49 PM ?

## 2021-11-15 NOTE — Plan of Care (Signed)
?  Problem: Education: Goal: Knowledge of General Education information will improve Description: Including pain rating scale, medication(s)/side effects and non-pharmacologic comfort measures Outcome: Progressing   Problem: Health Behavior/Discharge Planning: Goal: Ability to manage health-related needs will improve Outcome: Progressing   Problem: Clinical Measurements: Goal: Ability to maintain clinical measurements within normal limits will improve Outcome: Progressing Goal: Will remain free from infection Outcome: Progressing Goal: Diagnostic test results will improve Outcome: Progressing Goal: Respiratory complications will improve Outcome: Progressing Goal: Cardiovascular complication will be avoided Outcome: Progressing   Problem: Activity: Goal: Risk for activity intolerance will decrease Outcome: Progressing   Problem: Nutrition: Goal: Adequate nutrition will be maintained Outcome: Progressing   Problem: Coping: Goal: Level of anxiety will decrease Outcome: Progressing   Problem: Elimination: Goal: Will not experience complications related to bowel motility Outcome: Progressing Goal: Will not experience complications related to urinary retention Outcome: Progressing   Problem: Pain Managment: Goal: General experience of comfort will improve Outcome: Progressing   Problem: Safety: Goal: Ability to remain free from injury will improve Outcome: Progressing   Problem: Skin Integrity: Goal: Risk for impaired skin integrity will decrease Outcome: Progressing   Problem: Education: Goal: Knowledge of disease or condition will improve Outcome: Progressing Goal: Knowledge of secondary prevention will improve (SELECT ALL) Outcome: Progressing Goal: Knowledge of patient specific risk factors will improve (INDIVIDUALIZE FOR PATIENT) Outcome: Progressing Goal: Individualized Educational Video(s) Outcome: Progressing   

## 2021-11-15 NOTE — Consult Note (Signed)
? ?Chief Complaint: ?Patient was seen in consultation today for  ?Chief Complaint  ?Patient presents with  ? Dizziness  ? ? ?Referring Physician(s): Dr. Palikh ? ?Supervising Physician: Deveshwar, Sanjeev ? ?Patient Status: [facility] - In-pt ? ?History of Present Illness: ?Sonia Skinner is a 71 y.o. female with a medical history significant for DM, HTN, anxiety, carotid artery occlusion and stroke (left MCA s/p left CEA). She presented to the ED 11/13/21 with complaints of visual/speech/gait disturbances and general malaise. MR imaging was positive for severe left ICA stenosis and a right ICA aneurysm.  ? ?IMPRESSION: ?1. Negative MRA for large vessel occlusion or other emergent ?finding. ?2. Severe at least 70% stenosis involving the proximal cervical left ?ICA. Probable superimposed penetrating atheromatous ulcer at this ?level. ?3. Atheromatous change about the carotid siphons with associated ?stenoses of up to approximately 50-60% bilaterally. ?4. Approximate 70% segmental stenoses involving the origin and ?proximal left V1 segment. ?5. 3 mm laterally directed outpouching arising from the cavernous ?right ICA, suspicious for aneurysm, stable. ?  ?She was admitted for TIA workup and was evaluated by Neurology. Neuro Interventional Radiology was asked to evaluate this patient for an image-guided diagnostic cerebral angiogram. Imaging reviewed and procedure approved by Dr. Deveshwar.    ? ?Past Medical History:  ?Diagnosis Date  ? Ankle fracture   ? Left  ? Anxiety   ? Carotid artery occlusion   ? Closed fracture of left distal fibula 09/16/2017  ? Complication of anesthesia   ? ALLERY TO ESTER BASE  ? Depression   ? early 30s  ? Diabetes mellitus   ? INSULIN DEPENDENT  ? GERD (gastroesophageal reflux disease)   ? Headache   ? years ago  ? Hypertension   ? Pneumonia   ? Stroke (HCC) March 2015  ?  left MCA infarct, slight weakness on left side  ? Thyroid disease   ? ? ?Past Surgical History:  ?Procedure Laterality Date  ?  ABDOMINAL HYSTERECTOMY    ? ANTERIOR FIXATION AND POSTERIOR MICRODISCECTOMY CERVICAL SPINE  1999  ? CAROTID ENDARTERECTOMY Left 11-04-13  ? cea  ? ENDARTERECTOMY Left 11/04/2013  ? ORIF ANKLE FRACTURE Left 09/16/2017  ? Procedure: OPEN REDUCTION INTERNAL FIXATION (ORIF) LEFT ANKLE FRACTURE;  Surgeon: Landau, Joshua, MD;  Location: [facility] OR;  Service: Orthopedics;  Laterality: Left;  ? ? ?Allergies: ?Anesthetics, ester ? ?Medications: ?Prior to Admission medications   ?Medication Sig Start Date End Date Taking? Authorizing Provider  ?Accu-Chek FastClix Lancets MISC Use to check blood sugar five times daily. 04/21/21  Yes Jones, Thomas L, MD  ?acetaminophen (TYLENOL) 500 MG tablet Take 1,000-1,500 mg by mouth every 6 (six) hours as needed for mild pain.   Yes [provider]  ?Acetaminophen-Codeine (TYLENOL/CODEINE #3) 300-30 MG tablet Take 1 tablet by mouth every 6 (six) hours as needed for pain. 10/05/21  Yes John, James W, MD  ?amitriptyline (ELAVIL) 100 MG tablet Take 1 tablet (100 mg total) by mouth at bedtime. ?Patient taking differently: Take 100 mg by mouth at bedtime as needed for sleep. 08/28/21  Yes Arfeen, Syed T, MD  ?amLODipine (NORVASC) 5 MG tablet Take 1 tablet (5 mg total) by mouth daily. 08/26/21  Yes Pemberton, Heather E, MD  ?aspirin EC 325 MG tablet Take 1 tablet (325 mg total) by mouth daily. 09/05/21 09/05/22 Yes Chatterjee, Srobona Tublu, MD  ?atorvastatin (LIPITOR) 40 MG tablet Take 1 tablet by mouth daily. ?Patient taking differently: Take 40 mg by mouth at bedtime. 09/19/21    Yes Jones, Thomas L, MD  ?blood glucose meter kit and supplies KIT Use to test blood sugars 04/27/19  Yes Jones, Thomas L, MD  ?buPROPion (WELLBUTRIN XL) 300 MG 24 hr tablet Take 1 tablet (300 mg total) by mouth daily. 08/28/21  Yes Arfeen, Syed T, MD  ?Cholecalciferol (VITAMIN D3 PO) Take 1 tablet by mouth daily. Unsure of dose   Yes [provider]  ?dexlansoprazole (DEXILANT) 60 MG capsule Take 1 capsule (60 mg total)  by mouth daily. 08/20/21  Yes Jones, Thomas L, MD  ?EDARBYCLOR 40-12.5 MG TABS Take 1 tablet by mouth daily. 09/19/21  Yes Jones, Thomas L, MD  ?FARXIGA 10 MG TABS tablet Take 1 tablet by mouth daily before breakfast. 09/19/21  Yes Jones, Thomas L, MD  ?gabapentin (NEURONTIN) 100 MG capsule Take 1 capsule (100 mg total) by mouth 3 (three) times daily. ?Patient taking differently: Take 100-200 mg by mouth See admin instructions. 200 mg int the morning ?100 mg at bedtime 10/05/21  Yes John, James W, MD  ?glucose blood (ACCU-CHEK GUIDE) test strip Use to check blood sugars five times daily. 04/21/21  Yes Jones, Thomas L, MD  ?Insulin Glargine (BASAGLAR KWIKPEN ) Inject 30-50 Units into the skin daily. 30-35 units if below 150 ?50 units if over 150   Yes [provider]  ?Insulin Pen Needle 32G X 4 MM MISC Use to administer insulin four times a day 04/02/20  Yes Jones, Thomas L, MD  ?levothyroxine (SYNTHROID) 88 MCG tablet Take 1 tablet (88 mcg total) by mouth daily. 08/27/21  Yes Jones, Thomas L, MD  ?loratadine (CLARITIN) 10 MG tablet Take 1 tablet (10 mg total) by mouth daily. 04/21/21  Yes Jones, Thomas L, MD  ?Semaglutide, 2 MG/DOSE, 8 MG/3ML SOPN Inject 2 mg as directed once a week. ?Patient taking differently: Inject 2 mg as directed every 14 (fourteen) days. 08/28/21  Yes Jones, Thomas L, MD  ?Continuous Blood Gluc Receiver (DEXCOM G6 RECEIVER) DEVI 1 Act by Does not apply route daily. 08/27/21   Jones, Thomas L, MD  ?Continuous Blood Gluc Sensor (DEXCOM G6 SENSOR) MISC 1 Act by Does not apply route daily. 08/27/21   Jones, Thomas L, MD  ?Continuous Blood Gluc Transmit (DEXCOM G6 TRANSMITTER) MISC 1 Act by Does not apply route daily. 08/27/21   Jones, Thomas L, MD  ?Insulin NPH, Human,, Isophane, (NOVOLIN N FLEXPEN) 100 UNIT/ML Kiwkpen Inject 35 Units into the skin every morning. And pen needles 1/day ?Patient not taking: Reported on 11/14/2021 11/02/21   Ellison, Sean, MD  ?  ? ?Family History  ?Problem Relation Age of  Onset  ? Diabetes Mother   ? Heart disease Mother   ?     Before age 60  ? Cancer Father   ?     Lung  ? Hypertension Sister   ? Diabetes Sister   ? Diabetes Sister   ? Diabetes Sister   ? ? ?Social History  ? ?Socioeconomic History  ? Marital status: Legally Separated  ?  Spouse name: Not on file  ? Number of children: 0  ? Years of education: College  ? Highest education level: Not on file  ?Occupational History  ? Occupation: [employer]  ?  Employer: [employer]  ?Tobacco Use  ? Smoking status: Former  ?  Packs/day: 1.00  ?  Years: 20.00  ?  Pack years: 20.00  ?  Types: Cigarettes  ?  Quit date: 11/01/2013  ?  Years since quitting: 8.0  ?   Smokeless tobacco: Never  ?Vaping Use  ? Vaping Use: Never used  ?Substance and Sexual Activity  ? Alcohol use: Not Currently  ?  Alcohol/week: 1.0 - 2.0 standard drink  ?  Types: 1 - 2 Standard drinks or equivalent per week  ? Drug use: No  ? Sexual activity: Not Currently  ?Other Topics Concern  ? Not on file  ?Social History Narrative  ? Patient lives in 1 story home with sister.  ? Caffeine Use: 2 cups daily; sodas occasionally  ? Some college education  ? Works as Oceanographer for [employer]  ? ?Social Determinants of Health  ? ?Financial Resource Strain: Not on file  ?Food Insecurity: No Food Insecurity  ? Worried About Charity fundraiser in the Last Year: Never true  ? Ran Out of Food in the Last Year: Never true  ?Transportation Needs: No Transportation Needs  ? Lack of Transportation (Medical): No  ? Lack of Transportation (Non-Medical): No  ?Physical Activity: Not on file  ?Stress: Stress Concern Present  ? Feeling of Stress : Very much  ?Social Connections: Not on file  ? ? ?Review of Systems: A 12 point ROS discussed and pertinent positives are indicated in the HPI above.  All other systems are negative. ? ?Review of Systems  ?Constitutional:  Negative for appetite change and fatigue.  ?Eyes:  Negative for visual disturbance.  ?Respiratory:  Negative for cough and  shortness of breath.   ?Cardiovascular:  Negative for chest pain and leg swelling.  ?Gastrointestinal:  Negative for abdominal pain, diarrhea, nausea and vomiting.  ?Neurological:  Negative for dizziness

## 2021-11-15 NOTE — Procedures (Addendum)
Patient Name: Sonia Skinner  ?MRN: [id number]  ?Epilepsy Attending: Lora Havens  ?Referring Physician/Provider: Donnetta Simpers, MD ?Date: 11/14/2021 ?Duration: 23.42 mins ? ?Patient history: 71 y.o. female with PMH significant for DM2, GERD, prior small L MCA stroke, HTN, headaches, hypothyroidism who presents with multiple complaints. Most significantly, reports episode of entire vision going white while she was driving on Wednesday but was brief and endorses some lightheadedness during that too. 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 This went on for about 30-40 mins and then resolved. Reports she also had an episode later where she felt off. EEG to evaluate for seizure ? ?Level of alertness: Awake,asleep ? ?AEDs during EEG study: None ? ?Technical aspects: This EEG study was done with scalp electrodes positioned according to the 10-20 International system of electrode placement. Electrical activity was acquired at a sampling rate of '500Hz'$  and reviewed with a high frequency filter of '70Hz'$  and a low frequency filter of '1Hz'$ . EEG data were recorded continuously and digitally stored.  ? ?Description: The posterior dominant rhythm consists of 8-'9Hz'$  activity of moderate voltage (25-35 uV) seen predominantly in posterior head regions, symmetric and reactive to eye opening and eye closing. Sleep was characterized by vertex waves, sleep spindles (12 to 14 Hz), maximal frontocentral region. Hyperventilation and photic stimulation were not performed.    ? ?IMPRESSION: ?This study is within normal limits. No seizures or epileptiform discharges were seen throughout the recording. ? ?Lora Havens  ? ?

## 2021-11-15 NOTE — Progress Notes (Addendum)
STROKE TEAM PROGRESS NOTE  ? ?INTERVAL HISTORY ?Patient is seen in her room with no family at the bedside.  She has been hemodynamically stable with no acute events overnight.  Her neurological exam is stable.  Dr. [name] will see her regarding therapeutic options for her 70% stenosis of her left carotid artery. ? ?Vitals:  ? 11/14/21 2059 11/15/21 0008 11/15/21 0400 11/15/21 0815  ?BP: (!) 145/54 (!) 133/56 (!) 129/50 (!) 121/98  ?Pulse: 83 85 87   ?Resp: '18 17 19   '$ ?Temp: 98.4 ?F (36.9 ?C) 98.3 ?F (36.8 ?C) 98.1 ?F (36.7 ?C) 98.4 ?F (36.9 ?C)  ?TempSrc: Oral Oral Oral Oral  ?SpO2: 100% 97% 97%   ?Weight:      ?Height:      ? ?CBC:  ?Recent Labs  ?Lab 11/13/21 ?1356  ?WBC 10.4  ?NEUTROABS 6.5  ?HGB 11.3*  ?HCT 36.2  ?MCV 88.1  ?PLT 240  ? ? ?Basic Metabolic Panel:  ?Recent Labs  ?Lab 11/13/21 ?1843 11/15/21 ?0139  ?NA 142 138  ?K 4.5 4.1  ?CL 109 106  ?CO2 26 24  ?GLUCOSE 90 215*  ?BUN 24* 27*  ?CREATININE 2.02* 2.28*  ?CALCIUM 9.7 9.0  ? ? ?Lipid Panel:  ?Recent Labs  ?Lab 11/14/21 ?1434  ?CHOL 183  ?TRIG 198*  ?HDL 42  ?CHOLHDL 4.4  ?VLDL 40  ?LDLCALC 101*  ? ?HgbA1c:  ?Recent Labs  ?Lab 11/14/21 ?1434  ?HGBA1C 7.8*  ? ?Urine Drug Screen: No results for input(s): LABOPIA, COCAINSCRNUR, LABBENZ, AMPHETMU, THCU, LABBARB in the last 168 hours.  ?Alcohol Level No results for input(s): ETH in the last 168 hours. ? ?IMAGING past 24 hours ?EEG adult ? ?Result Date: 11/15/2021 ?Lora Havens, MD     11/15/2021  9:03 AM Patient Name: Sonia Skinner MRN: [id number] Epilepsy Attending: Lora Havens Referring Physician/Provider: Donnetta Simpers, MD Date: 11/14/2021 Duration: 23.42 mins Patient history: 71 y.o. female with PMH significant for DM2, GERD, prior small L MCA stroke, HTN, headaches, hypothyroidism who presents with multiple complaints. Most significantly, reports episode of entire vision going white while she was driving on Wednesday but was brief and endorses some lightheadedness during that too. She works as  a Oceanographer and on Friday, out of nowhere, reports trouble with speech, tongue felt thick and twisted, could not get the words out while she was talking to other teachers. This went on for about 30-40 mins and then resolved. Reports she also had an episode later where she felt off. EEG to evaluate for seizure Level of alertness: Awake,asleep AEDs during EEG study: None Technical aspects: This EEG study was done with scalp electrodes positioned according to the 10-20 International system of electrode placement. Electrical activity was acquired at a sampling rate of '500Hz'$  and reviewed with a high frequency filter of '70Hz'$  and a low frequency filter of '1Hz'$ . EEG data were recorded continuously and digitally stored. Description: The posterior dominant rhythm consists of 8-'9Hz'$  activity of moderate voltage (25-35 uV) seen predominantly in posterior head regions, symmetric and reactive to eye opening and eye closing. Sleep was characterized by vertex waves, sleep spindles (12 to 14 Hz), maximal frontocentral region. Hyperventilation and photic stimulation were not performed.   IMPRESSION: This study is within normal limits. No seizures or epileptiform discharges were seen throughout the recording. Priyanka Barbra Sarks  ? ?ECHOCARDIOGRAM COMPLETE ? ?Result Date: 11/14/2021 ?   ECHOCARDIOGRAM REPORT   Patient Name:   Sonia Skinner Date of Exam: 11/14/2021 Medical  Rec #:  [id number]   Height:       62.0 in Accession #:    [id number]  Weight:       125.0 lb Date of Birth:  11/02/1950   BSA:          1.566 m? Patient Age:    71 years    BP:           140/52 mmHg Patient Gender: F           HR:           82 bpm. Exam Location:  Inpatient Procedure: 2D Echo, Cardiac Doppler and Color Doppler Indications:    G45.9 TIA  History:        Patient has prior history of Echocardiogram examinations, most                 recent 08/14/2021. Stroke; Risk Factors:Diabetes and                 Hypertension. GERD.  Sonographer:    Beryle Beams  Referring Phys: [place]  1. Left ventricular ejection fraction, by estimation, is >75%. The left ventricle has hyperdynamic function. The left ventricle has no regional wall motion abnormalities. There is mild left ventricular hypertrophy of the basal-septal segment. Left ventricular diastolic parameters are consistent with Grade I diastolic dysfunction (impaired relaxation). Elevated left atrial pressure.  2. Right ventricular systolic function is normal. The right ventricular size is normal. Tricuspid regurgitation signal is inadequate for assessing PA pressure.  3. The mitral valve is normal in structure. Trivial mitral valve regurgitation. No evidence of mitral stenosis.  4. The aortic valve is normal in structure. Aortic valve regurgitation is not visualized. No aortic stenosis is present. FINDINGS  Left Ventricle: Left ventricular ejection fraction, by estimation, is >75%. The left ventricle has hyperdynamic function. The left ventricle has no regional wall motion abnormalities. The left ventricular internal cavity size was normal in size. There is mild left ventricular hypertrophy of the basal-septal segment. Left ventricular diastolic parameters are consistent with Grade I diastolic dysfunction (impaired relaxation). Elevated left atrial pressure. Right Ventricle: The right ventricular size is normal. Right ventricular systolic function is normal. Tricuspid regurgitation signal is inadequate for assessing PA pressure. The tricuspid regurgitant velocity is 2.24 m/s, and with an assumed right atrial  pressure of 3 mmHg, the estimated right ventricular systolic pressure is 35.5 mmHg. Left Atrium: Left atrial size was normal in size. Right Atrium: Right atrial size was normal in size. Pericardium: There is no evidence of pericardial effusion. Mitral Valve: The mitral valve is normal in structure. Trivial mitral valve regurgitation. No evidence of mitral valve stenosis. Tricuspid Valve: The  tricuspid valve is normal in structure. Tricuspid valve regurgitation is trivial. No evidence of tricuspid stenosis. Aortic Valve: The aortic valve is normal in structure. Aortic valve regurgitation is not visualized. No aortic stenosis is present. Aortic valve mean gradient measures 4.0 mmHg. Aortic valve peak gradient measures 9.9 mmHg. Aortic valve area, by VTI measures 1.98 cm?. Pulmonic Valve: The pulmonic valve was normal in structure. Pulmonic valve regurgitation is not visualized. No evidence of pulmonic stenosis. Aorta: The aortic root is normal in size and structure. Venous: The inferior vena cava was not well visualized. IAS/Shunts: The interatrial septum was not well visualized.  LEFT VENTRICLE PLAX 2D LVIDd:         3.70 cm     Diastology LVIDs:         3.50  cm     LV e' medial:    6.74 cm/s LV PW:         1.10 cm     LV E/e' medial:  17.8 LV IVS:        1.10 cm     LV e' lateral:   13.20 cm/s LVOT diam:     1.70 cm     LV E/e' lateral: 9.1 LV SV:         65 LV SV Index:   42 LVOT Area:     2.27 cm?  LV Volumes (MOD) LV vol d, MOD A2C: 46.3 ml LV vol d, MOD A4C: 52.5 ml LV vol s, MOD A2C: 26.3 ml LV vol s, MOD A4C: 24.7 ml LV SV MOD A2C:     20.0 ml LV SV MOD A4C:     52.5 ml LV SV MOD BP:      25.7 ml RIGHT VENTRICLE RV S prime:     12.90 cm/s TAPSE (M-mode): 1.4 cm LEFT ATRIUM             Index        RIGHT ATRIUM          Index LA diam:        3.30 cm 2.11 cm/m?   RA Area:     8.78 cm? LA Vol (A2C):   44.8 ml 28.62 ml/m?  RA Volume:   16.30 ml 10.41 ml/m? LA Vol (A4C):   25.2 ml 16.10 ml/m? LA Biplane Vol: 34.1 ml 21.78 ml/m?  AORTIC VALVE                    PULMONIC VALVE AV Area (Vmax):    1.88 cm?     PV Vmax:        0.74 m/s AV Area (Vmean):   2.13 cm?     PV Vmean:       52.100 cm/s AV Area (VTI):     1.98 cm?     PV VTI:         0.157 m AV Vmax:           157.00 cm/s  PV Peak grad:   2.2 mmHg AV Vmean:          95.700 cm/s  PV Mean grad:   1.0 mmHg AV VTI:            0.329 m      RVOT Peak  grad: 4 mmHg AV Peak Grad:      9.9 mmHg AV Mean Grad:      4.0 mmHg LVOT Vmax:         130.00 cm/s LVOT Vmean:        90.000 cm/s LVOT VTI:          0.287 m LVOT/AV VTI ratio: 0.87  AORTA Ao Root diam: 2.20 cm

## 2021-11-16 ENCOUNTER — Encounter (HOSPITAL_COMMUNITY): Payer: Self-pay | Admitting: Internal Medicine

## 2021-11-16 ENCOUNTER — Inpatient Hospital Stay (HOSPITAL_COMMUNITY): Payer: Medicare Other

## 2021-11-16 DIAGNOSIS — G459 Transient cerebral ischemic attack, unspecified: Secondary | ICD-10-CM | POA: Diagnosis not present

## 2021-11-16 HISTORY — PX: IR ANGIO INTRA EXTRACRAN SEL COM CAROTID INNOMINATE BILAT MOD SED: IMG5360

## 2021-11-16 HISTORY — PX: IR ANGIO VERTEBRAL SEL VERTEBRAL BILAT MOD SED: IMG5369

## 2021-11-16 LAB — BASIC METABOLIC PANEL
Anion gap: 7 (ref 5–15)
BUN: 33 mg/dL — ABNORMAL HIGH (ref 8–23)
CO2: 21 mmol/L — ABNORMAL LOW (ref 22–32)
Calcium: 8.6 mg/dL — ABNORMAL LOW (ref 8.9–10.3)
Chloride: 110 mmol/L (ref 98–111)
Creatinine, Ser: 2.13 mg/dL — ABNORMAL HIGH (ref 0.44–1.00)
GFR, Estimated: 24 mL/min — ABNORMAL LOW (ref >60)
Glucose, Bld: 186 mg/dL — ABNORMAL HIGH (ref 70–99)
Potassium: 4.1 mmol/L (ref 3.5–5.1)
Sodium: 138 mmol/L (ref 135–145)

## 2021-11-16 LAB — CBC
HCT: 32.1 % — ABNORMAL LOW (ref 36.0–46.0)
Hemoglobin: 10 g/dL — ABNORMAL LOW (ref 12.0–15.0)
MCH: 27.4 pg (ref 26.0–34.0)
MCHC: 31.2 g/dL (ref 30.0–36.0)
MCV: 87.9 fL (ref 80.0–100.0)
Platelets: 204 10*3/uL (ref 150–400)
RBC: 3.65 MIL/uL — ABNORMAL LOW (ref 3.87–5.11)
RDW: 13.3 % (ref 11.5–15.5)
WBC: 9.7 10*3/uL (ref 4.0–10.5)
nRBC: 0 % (ref 0.0–0.2)

## 2021-11-16 LAB — GLUCOSE, CAPILLARY
Glucose-Capillary: 58 mg/dL — ABNORMAL LOW (ref 70–99)
Glucose-Capillary: 64 mg/dL — ABNORMAL LOW (ref 70–99)
Glucose-Capillary: 150 mg/dL — ABNORMAL HIGH (ref 70–99)
Glucose-Capillary: 52 mg/dL — ABNORMAL LOW (ref 70–99)
Glucose-Capillary: 156 mg/dL — ABNORMAL HIGH (ref 70–99)
Glucose-Capillary: 49 mg/dL — ABNORMAL LOW (ref 70–99)
Glucose-Capillary: 144 mg/dL — ABNORMAL HIGH (ref 70–99)
Glucose-Capillary: 110 mg/dL — ABNORMAL HIGH (ref 70–99)
Glucose-Capillary: 204 mg/dL — ABNORMAL HIGH (ref 70–99)

## 2021-11-16 LAB — PROTIME-INR
INR: 1 (ref 0.8–1.2)
Prothrombin Time: 13 seconds (ref 11.4–15.2)

## 2021-11-16 MED ORDER — INSULIN GLARGINE-YFGN 100 UNIT/ML ~~LOC~~ SOLN
10.0000 [IU] | Freq: Every day | SUBCUTANEOUS | Status: DC
Start: 2021-11-16 — End: 2021-11-16
  Filled 2021-11-16: qty 0.1

## 2021-11-16 MED ORDER — HYDRALAZINE HCL 20 MG/ML IJ SOLN
INTRAMUSCULAR | Status: AC | PRN
Start: 2021-11-16 — End: 2021-11-16
  Administered 2021-11-16: 5 mg via INTRAVENOUS

## 2021-11-16 MED ORDER — DEXTROSE 50 % IV SOLN
1.0000 | Freq: Four times a day (QID) | INTRAVENOUS | Status: DC | PRN
  Administered 2021-11-16: 50 mL via INTRAVENOUS
  Filled 2021-11-16: qty 50

## 2021-11-16 MED ORDER — LIDOCAINE HCL 1 % IJ SOLN
INTRAMUSCULAR | Status: AC
Start: 2021-11-16 — End: 2021-11-17
  Filled 2021-11-16: qty 20

## 2021-11-16 MED ORDER — HYDRALAZINE HCL 20 MG/ML IJ SOLN
INTRAMUSCULAR | Status: AC
  Filled 2021-11-16: qty 1

## 2021-11-16 MED ORDER — MIDAZOLAM HCL 2 MG/2ML IJ SOLN
INTRAMUSCULAR | Status: AC
Start: 2021-11-16 — End: 2021-11-17
  Filled 2021-11-16: qty 2

## 2021-11-16 MED ORDER — DEXTROSE 5 % IV SOLN: INTRAVENOUS | Status: DC

## 2021-11-16 MED ORDER — HEPARIN SODIUM (PORCINE) 1000 UNIT/ML IJ SOLN
INTRAMUSCULAR | Status: AC
  Filled 2021-11-16: qty 10

## 2021-11-16 MED ORDER — FENTANYL CITRATE (PF) 100 MCG/2ML IJ SOLN
INTRAMUSCULAR | Status: AC
  Filled 2021-11-16: qty 2

## 2021-11-16 MED ORDER — DEXTROSE 50 % IV SOLN
25.0000 g | INTRAVENOUS | Status: AC
  Administered 2021-11-16: 25 g via INTRAVENOUS

## 2021-11-16 MED ORDER — IOHEXOL 300 MG/ML  SOLN
100.0000 mL | Freq: Once | INTRAMUSCULAR | Status: AC | PRN
  Administered 2021-11-16: 80 mL via INTRA_ARTERIAL

## 2021-11-16 MED ORDER — HEPARIN SODIUM (PORCINE) 1000 UNIT/ML IJ SOLN
INTRAMUSCULAR | Status: AC | PRN
  Administered 2021-11-16: 1000 [IU] via INTRAVENOUS

## 2021-11-16 MED ORDER — DEXTROSE 50 % IV SOLN
12.5000 g | INTRAVENOUS | Status: AC
  Administered 2021-11-16: 12.5 g via INTRAVENOUS

## 2021-11-16 MED ORDER — SODIUM CHLORIDE 0.9 % IV SOLN: INTRAVENOUS | Status: DC

## 2021-11-16 MED ORDER — DEXTROSE 50 % IV SOLN
INTRAVENOUS | Status: AC
  Filled 2021-11-16: qty 50

## 2021-11-16 MED ORDER — FENTANYL CITRATE (PF) 100 MCG/2ML IJ SOLN
INTRAMUSCULAR | Status: AC | PRN
  Administered 2021-11-16: 25 ug via INTRAVENOUS
  Administered 2021-11-16: 12.5 ug via INTRAVENOUS

## 2021-11-16 MED ORDER — MIDAZOLAM HCL 2 MG/2ML IJ SOLN
INTRAMUSCULAR | Status: AC | PRN
  Administered 2021-11-16: 1 mg via INTRAVENOUS
  Administered 2021-11-16: .5 mg via INTRAVENOUS

## 2021-11-16 NOTE — Progress Notes (Signed)
?  Transition of Care (TOC) Screening Note ? ? ?Patient Details  ?Name: Sonia Skinner ?Date of Birth: 08-02-1951 ? ? ?Transition of Care (TOC) CM/SW Contact:    ?Cyndi Bender, RN ?Phone Number: ?11/16/2021, 9:32 AM ? ? ? ?Transition of Care Department [facility]) has reviewed patient and no TOC needs have been identified at this time. We will continue to monitor patient advancement through interdisciplinary progression rounds. If new patient transition needs arise, please place a TOC consult. ? ? ?

## 2021-11-16 NOTE — Progress Notes (Signed)
?PROGRESS NOTE ? ? ? ?Sonia Skinner  RCV:[id number] DOB: [birth year] DOA: 11/13/2021 ?PCP: [name], MD  ? ? ?Brief Narrative:  ?Sonia Skinner is a 71 year old female with past medical history significant for type 2 diabetes mellitus, essential hypertension, ICA stenosis s/p left carotid endarterectomy 2015, CKD stage IIIb, GERD, history of CVA who presented to [facility] ED on 3/31 with a visual changes described as "vision white out", feeling of tongue thickening, gait disturbance and speech impairment.  Visual changes onset roughly 3 days prior.  Speech impairment reported lasting about 30 minutes.  Additionally with fatigue and dizziness which prompted her to seek care in the ED. ? ?In the ED, temperature 98.7 ?F, HR 98, RR 20, BP 189/66, SPO2 99% on room air.  WBC 10.4, hemoglobin 11.3, platelets 240. Sodium 142, potassium 4.5, chloride 109, CO2 26, glucose 90, BUN 24, creatinine 2.02.  High sensitive troponin less than 2 x 2.  Urinalysis unrevealing.  CT head without contrast with no acute intracranial findings, noted old infarct left parietal cortex unchanged.  Chest x-ray with no evidence of active cardiopulmonary disease process.  Neurology was consulted.  Patient was transferred to [facility] for further stroke versus TIA work-up.  ? ? ?  ?Assessment and Plan: ?* TIA (transient ischemic attack) ?Patient presenting to ED with reported visual changes, tongue thickening, speech impairment, fatigue and dizziness.  CT head without contrast with no acute intracranial findings, old parietal cortex infarct noted.  MR brain no acute infarct, chronic left MCA infarct.  MRA head/neck 70% stenosis proximal cervical left ICA.  TTE with LVEF greater than 75%, no regional wall motion abnormalities, mild LVH, grade 1 diastolic dysfunction, trivial MR, no aortic stenosis, intra-atrial septum not well visualized.  Hemoglobin A1c 7.8.  LDL 101.  EEG with no seizures or epileptiform discharges. ?--Neurology  following, appreciate assistance ?--DAPT with aspirin 81 mg p.o. daily and Brilinta 90 mg p.o. twice daily ?--PT/OT with no follow-up recommended ?--Monitor on telemetry ? ?Left carotid stenosis ?MRA with severe 70% stenosis left ICA with atheromatous ulcer, 50-60% stenosis of bilateral carotid siphons. ?--Continue aspirin 81 mg p.o. daily ?--Brilinta 90 mg p.o. twice daily ?--Atorvastatin 80 mg p.o. daily ?-- Pending diagnostic cerebral angiogram with Dr. [name] today ? ?Stage 3b chronic kidney disease ([place]) ?Creatinine 2.02 on admission.  Creatinine 1.4-1.8. ?--Cr 2.02>2.28>2.13 ?--NS at 75 mL/h ?--Avoid nephrotoxins, renally dose all medications ?--Repeat BMP in a.m. ? ?GERD (gastroesophageal reflux disease) ?On Dexilant 60 mg p.o. daily at home. ?--Continue Protonix 40 mg p.o. daily as hospital substitution ? ?Major depressive disorder, recurrent episode ([place]) ?--Continue Wellbutrin. ? ?Hyperlipidemia with target LDL less than 70 ?Lipid panel with total cholesterol 183, LDL 101, triglycerides 198, HDL 42. ?--Atorvastatin 80 mg p.o. daily ?--Zetia 10 mg p.o. daily ? ?Hypothyroidism ?TSH 1.088, free T4 1.13. ?--Continue home levothyroxine 88 mcg p.o. daily ? ?Type II diabetes mellitus with manifestations ([place]) ?Home regimen includes insulin glargine 30-50 units supposedly daily, Farxiga 10 mg p.o. daily, semaglutide 2 mg every 2 weeks. ?--Hemoglobin A1c: 7.8 ?--Semglee 10u [place] daily ?--SSI for coverage ?--CBG before every meal/at bedtime ? ?Essential hypertension, benign ?Home regimen includes amlodipine 10 mg p.o. daily. ?--BP 130/53 this am ?--will restart amlodipine following cerebral angiogram ? ?Mass of left parotid gland ?1.3 cm left parietal lesion noted on MRI head/neck. Will need outpatient ENT referral. ? ? ? ? ?DVT prophylaxis: heparin injection 5,000 Units Start: 11/14/21 0715 ?SCD's Start: 11/14/21 0708 ? ?  Code Status: Full Code ?Family Communication: No family present at bedside this  morning ? ?Disposition Plan:  ?Level of care: Telemetry Medical ?Status is: Observation ?The patient remains OBS appropriate and will d/c before 2 midnights. ?  ? ?Consultants:  ?Neurology ?Neuro interventional radiology, Dr. [name] ? ?Procedures:  ?TTE ? ?Antimicrobials:  ?none ? ? ?Subjective: ?Patient seen examined bedside, resting comfortably.  No specific complaints this morning.  Continues with resolution of visual disturbances.  Pending diagnostic cerebral angiogram by Dr. [name] today for left ICA stenosis. Denies headache, no chest pain, no palpitations, no shortness of breath, no abdominal pain, no focal weakness, no fatigue, no fever/chills/night sweats, no nausea/vomiting/diarrhea, no paresthesias.  No acute events overnight per nursing staff. ? ?Objective: ?Vitals:  ? 11/15/21 1947 11/15/21 2340 11/16/21 0322 11/16/21 0817  ?BP: (!) 140/42 (!) 121/42 (!) 130/53 134/62  ?Pulse: 89 77 73 76  ?Resp: '17 13 13 20  '$ ?Temp: 98.4 ?F (36.9 ?C) 99 ?F (37.2 ?C) 98.9 ?F (37.2 ?C) 97.6 ?F (36.4 ?C)  ?TempSrc: Oral Axillary Axillary Oral  ?SpO2: 98% 98% 98%   ?Weight:      ?Height:      ? ? ?Intake/Output Summary (Last 24 hours) at 11/16/2021 1136 ?Last data filed at 11/16/2021 0344 ?Gross per 24 hour  ?Intake 1088.86 ml  ?Output --  ?Net 1088.86 ml  ? ?Filed Weights  ? 11/13/21 1334  ?Weight: 56.7 kg  ? ? ?Examination: ? ?Physical Exam: ?GEN: NAD, alert and oriented x 3, wd/wn ?HEENT: NCAT, PERRL, EOMI, sclera clear, MMM ?PULM: CTAB w/o wheezes/crackles, normal respiratory effort, on room air ?CV: RRR w/o M/G/R ?GI: abd soft, NTND, NABS, no R/G/M ?MSK: no peripheral edema, muscle strength globally intact 5/5 bilateral upper/lower extremities ?NEURO: CN II-XII intact, no focal deficits, sensation to light touch intact ?PSYCH: normal mood/affect ?Integumentary: dry/intact, no rashes or wounds ? ? ? ?Data Reviewed: I have personally reviewed following labs and imaging studies ? ?CBC: ?Recent Labs  ?Lab 11/13/21 ?1356  11/16/21 ?0100  ?WBC 10.4 9.7  ?NEUTROABS 6.5  --   ?HGB 11.3* 10.0*  ?HCT 36.2 32.1*  ?MCV 88.1 87.9  ?PLT 240 204  ? ?Basic Metabolic Panel: ?Recent Labs  ?Lab 11/13/21 ?1843 11/15/21 ?0139 11/16/21 ?0100  ?NA 142 138 138  ?K 4.5 4.1 4.1  ?CL 109 106 110  ?CO2 26 24 21*  ?GLUCOSE 90 215* 186*  ?BUN 24* 27* 33*  ?CREATININE 2.02* 2.28* 2.13*  ?CALCIUM 9.7 9.0 8.6*  ? ?GFR: ?Estimated Creatinine Clearance: 18.9 mL/min (A) (by C-G formula based on SCr of 2.13 mg/dL (H)). ?Liver Function Tests: ?No results for input(s): AST, ALT, ALKPHOS, BILITOT, PROT, ALBUMIN in the last 168 hours. ?No results for input(s): LIPASE, AMYLASE in the last 168 hours. ?No results for input(s): AMMONIA in the last 168 hours. ?Coagulation Profile: ?Recent Labs  ?Lab 11/16/21 ?0100  ?INR 1.0  ? ?Cardiac Enzymes: ?No results for input(s): CKTOTAL, CKMB, CKMBINDEX, TROPONINI in the last 168 hours. ?BNP (last 3 results) ?No results for input(s): PROBNP in the last 8760 hours. ?HbA1C: ?Recent Labs  ?  11/14/21 ?1434  ?HGBA1C 7.8*  ? ?CBG: ?Recent Labs  ?Lab 11/15/21 ?1640 11/15/21 ?2110 11/16/21 ?0827 11/16/21 ?0902 11/16/21 ?1015  ?GLUCAP 160* 170* 58* 64* 150*  ? ?Lipid Profile: ?Recent Labs  ?  11/14/21 ?1434  ?CHOL 183  ?HDL 42  ?LDLCALC 101*  ?TRIG 198*  ?CHOLHDL 4.4  ? ?Thyroid Function Tests: ?Recent Labs  ?  11/14/21 ?1434  ?  TSH 1.088  ?FREET4 1.13*  ? ?Anemia Panel: ?No results for input(s): VITAMINB12, FOLATE, FERRITIN, TIBC, IRON, RETICCTPCT in the last 72 hours. ?Sepsis Labs: ?No results for input(s): PROCALCITON, LATICACIDVEN in the last 168 hours. ? ?No results found for this or any previous visit (from the past 240 hour(s)).  ? ? ? ? ? ?Radiology Studies: ?EEG adult ? ?Result Date: 11/15/2021 ?Lora Havens, MD     11/15/2021  9:03 AM Patient Name: EARNEST THALMAN MRN: [id number] Epilepsy Attending: Lora Havens Referring Physician/Provider: Donnetta Simpers, MD Date: 11/14/2021 Duration: 23.42 mins Patient history: 71 y.o. female  with PMH significant for DM2, GERD, prior small L MCA stroke, HTN, headaches, hypothyroidism who presents with multiple complaints. Most significantly, reports episode of entire vision going white while she was

## 2021-11-16 NOTE — Progress Notes (Addendum)
Suspected hematoma formation at R groin site. Pulses are present. [place] DO notified at this time.  ? ?Attending noted he is available until 7pm via secure chat.  ?

## 2021-11-16 NOTE — Progress Notes (Signed)
STROKE TEAM PROGRESS NOTE  ? ?INTERVAL HISTORY ?Patient is seen in her room with no family at the bedside.  She has been hemodynamically stable with no acute events overnight.  Her neurological exam is stable.  Dr. [name] plans on doing diagnostic cerebral catheter angiogram today to confirm 70% carotid stenosis and then do elective angioplasty stenting later this week. ?Vitals:  ? 11/15/21 2340 11/16/21 0322 11/16/21 0817 11/16/21 1225  ?BP: (!) 121/42 (!) 130/53 134/62 (!) 141/71  ?Pulse: 77 73 76   ?Resp: '13 13 20 11  '$ ?Temp: 99 ?F (37.2 ?C) 98.9 ?F (37.2 ?C) 97.6 ?F (36.4 ?C) 98.5 ?F (36.9 ?C)  ?TempSrc: Axillary Axillary Oral Oral  ?SpO2: 98% 98%    ?Weight:      ?Height:      ? ?CBC:  ?Recent Labs  ?Lab 11/13/21 ?1356 11/16/21 ?0100  ?WBC 10.4 9.7  ?NEUTROABS 6.5  --   ?HGB 11.3* 10.0*  ?HCT 36.2 32.1*  ?MCV 88.1 87.9  ?PLT 240 204  ? ?Basic Metabolic Panel:  ?Recent Labs  ?Lab 11/15/21 ?0139 11/16/21 ?0100  ?NA 138 138  ?K 4.1 4.1  ?CL 106 110  ?CO2 24 21*  ?GLUCOSE 215* 186*  ?BUN 27* 33*  ?CREATININE 2.28* 2.13*  ?CALCIUM 9.0 8.6*  ? ?Lipid Panel:  ?Recent Labs  ?Lab 11/14/21 ?1434  ?CHOL 183  ?TRIG 198*  ?HDL 42  ?CHOLHDL 4.4  ?VLDL 40  ?LDLCALC 101*  ? ?HgbA1c:  ?Recent Labs  ?Lab 11/14/21 ?1434  ?HGBA1C 7.8*  ? ?Urine Drug Screen: No results for input(s): LABOPIA, COCAINSCRNUR, LABBENZ, AMPHETMU, THCU, LABBARB in the last 168 hours.  ?Alcohol Level No results for input(s): ETH in the last 168 hours. ? ?IMAGING past 24 hours ?No results found. ? ?PHYSICAL EXAM ?General: Frail elderly African-American lady   in no acute distress ?Respiratory:  Regular, unlabored respirations on room air ? ?NEURO:  ?Mental Status: AA&Ox3  ?Speech/Language: speech is without dysarthria or aphasia.  Naming, repetition, fluency, and comprehension intact. ? ?Cranial Nerves:  ?II: PERRL. Visual fields full.  ?III, IV, VI: EOMI. Eyelids elevate symmetrically.  ?V: Sensation is intact to light touch and symmetrical to face.   ?VII: Smile is symmetrical.   ?VIII: hearing intact to voice. ?IX, X: Phonation is normal.  ?XT:[id number] shrug 5/5. ?XII: tongue is midline without fasciculations. ?Motor: 5/5 strength to all muscle groups tested.  ?Tone: is normal and bulk is normal ?Sensation- Intact to light touch bilaterally.   ?Coordination: FTN intact bilaterally ?Gait- deferred ? ? ?ASSESSMENT/PLAN ?Ms. DANNA SEWELL is a 71 y.o. female with history of DM2, GERD, small L MCA stroke, HTN, headaches and hypothyroidism presenting with an episode of impaired speech where her tongue felt thick and she was unable to get the words out.  This resolved spontaneously.  She has had several of these episodes over the last month.  MRI of the brain shows no acute stroke, but MRA of the neck shows 70% stenosis of the left ICA with possible ulcerated plaque.  Will consult IR to discuss options for this. ? ?Left hemispheric TIA:  in setting of 70% left ICA stenosis with ulcerated plaque ?CT head No acute abnormality. Atrophy. Old left parietal cortex infarction ?MRI  Chronic left MCA infarct with small remote right thalamic lacunar infarcts, 1.3 cm left parotid lesion ?MRA  Negative for LVO or emergent findings, severe 70% stenosis of left ICA with atheromatous ulcer, 50-60% stenosis of bilateral carotid siphons, 79m outpouching from right  ICA, 70% stenosis of left V1 segment ?2D Echo pending ?LDL 109 ?HgbA1c 8.0 ?VTE prophylaxis - SCDs, SQH ?   ?Diet  ? Diet NPO time specified Except for: Sips with Meds  ? ?aspirin 325 mg daily prior to admission, now on aspirin 81 mg daily and clopidogrel 75 mg daily.  ?Therapy recommendations:  no PT follow up ?Disposition:  pending ? ?Hypertension ?Home meds:  Edarbyclor 40-12.5 mg daily ?Stable ?Keep BP <180-105 ?Long-term BP goal normotensive ? ?Hyperlipidemia ?Home meds:  atorvastatin 80 mg daily, resumed in hospital ?LDL 109, goal < 70 ?Add zetia 10 mg daily  ?Continue statin at discharge ? ?Diabetes type II  Uncontrolled ?Home meds:  Farxiga 10 mg daily, insulin glargine 30-50 units daily ?HgbA1c 8.0, goal < 7.0 ?CBGs ?Recent Labs  ?  11/16/21 ?1015 11/16/21 ?1221 11/16/21 ?1313  ?GLUCAP 150* 52* 156*  ?  ?SSI ? ?Other Stroke Risk Factors ?Advanced Age >/= 3  ?Former cigarette smoker  ?Hx stroke ? ?Other Active Problems ?Left parotid lesion seen on MRI ?Recommend outpatient ENT follow up ?Hypothyroidism ?Continue home synthroid ? ?Hospital day # 1 ? ?Continue aspirin and Brilinta and diagnostic cerebral catheter angiogram later today and likely elective left carotid angioplasty and stenting later this week.  Aggressive risk factor modification.  Discussed with patient and Dr. [name].  Greater than 50% time during this 35-minute visit was spent on counseling and coordination of care and discussion with patient and care team about a TIA and symptomatic aortic stenosis and answered questions. ?Antony Contras, MD ?To contact Stroke Continuity provider, please refer to [email or web address] ?After hours, contact General Neurology  ?

## 2021-11-16 NOTE — Progress Notes (Signed)
Hypoglycemic Event ? ?CBG: 49 ? ?Treatment: D50 50 mL (25 gm) ? ?Symptoms:  lightheadedness ? ?Follow-up CBG: Time:17:45 CBG Result:144 ? ?Possible Reasons for Event: Other: NPO for procedure ? ?Comments/MD notified:[place], Randall Hiss, no new order, will continue to monitor ? ? ? ?Sonia Skinner ? ? ?

## 2021-11-16 NOTE — Progress Notes (Signed)
Inpatient Diabetes Program Recommendations ? ?AACE/ADA: New Consensus Statement on Inpatient Glycemic Control (2015) ? ?Target Ranges:  Prepandial:   less than 140 mg/dL ?     Peak postprandial:   less than 180 mg/dL (1-2 hours) ?     Critically ill patients:  140 - 180 mg/dL  ? ?Lab Results  ?Component Value Date  ? GLUCAP 150 (H) 11/16/2021  ? HGBA1C 7.8 (H) 11/14/2021  ? ? ?Review of Glycemic Control ? Latest Reference Range & Units 11/15/21 21:10 11/16/21 08:27 11/16/21 09:02 11/16/21 10:15  ?Glucose-Capillary 70 - 99 mg/dL 170 (H) 58 (L) 64 (L) 150 (H)  ?(H): Data is abnormally high ?(L): Data is abnormally low ?Diabetes history: Type 2 DM ?Outpatient Diabetes medications: Farxiga 10 mg QD, Basaglar 30-50 units QD, Ozempic 2 mg bi weekly ?Current orders for Inpatient glycemic control: Novolog 0-5 units QHS, Novolog 0-9 units TID, Semglee 15 units QD ? ?Inpatient Diabetes Program Recommendations:   ? ?Noted hypoglycemia this AM of 58 mg/dL. Consider decreasing Semglee to 10 units QD. Secure chat sent to md. ? ?Thanks, ?Bronson Curb, MSN, RNC-OB ?Diabetes Coordinator ?[phone] (8a-5p) ? ? ?

## 2021-11-16 NOTE — Progress Notes (Signed)
Pt returned to unit after having cerebral angiogram done.  Alert, oriented x4, talking with family member in room.  Rt groin site with dressing dry and intact.  No bleeding, no hematoma noted.  Good pedal and radial pulses noted. Good capillary refill.  No c/o any pain. V/S monitored as per order.  IVF restarted as per order. Pt instructed to lay flat in bed for the next 4 hours.  Rt site groin will be monitored closely for bleeding and hematoma. ?

## 2021-11-16 NOTE — Procedures (Signed)
INR ?Four-vessel cerebral arteriogram. ? ?Right CFA approach. ? ?Findings. ? ?1 approximately 75 to 80% stenosis of the left internal carotid artery proximally. ? ?2.  50% stenosis of the right internal carotid artery proximal cavernous segment. ? ?3.  Approximately 70% stenosis of the left vertebral artery at its origin. ? ?4.  Approximately 40% stenosis of the left internal carotid artery proximal cavernous segment. ? ?Arlean Hopping MD. ?

## 2021-11-16 NOTE — Sedation Documentation (Signed)
5 fr exoseal deployed at 1551, pressure being held ?

## 2021-11-16 NOTE — Progress Notes (Signed)
Nurse called attending at this time. No answer. Left voice message for [facility].  ?

## 2021-11-16 NOTE — Progress Notes (Signed)
SLP Cancellation Note ? ?Patient Details ?Name: JACLYNNE BALDO ?MRN: [id number] ?DOB: 18-Jan-1951 ? ? ?Cancelled treatment:       Reason Eval/Treat Not Completed: Patient at procedure or test/unavailable. Will f/u for cognitive-linguistic evaluation as able. ? ? ? ?Osie Bond., M.A. CCC-SLP ?Acute Rehabilitation Services ?Pager [phone] ?Office [phone] ? ?11/16/2021, 2:54 PM ?

## 2021-11-16 NOTE — Plan of Care (Signed)
?  Problem: Education: Goal: Knowledge of General Education information will improve Description: Including pain rating scale, medication(s)/side effects and non-pharmacologic comfort measures Outcome: Progressing   Problem: Health Behavior/Discharge Planning: Goal: Ability to manage health-related needs will improve Outcome: Progressing   Problem: Clinical Measurements: Goal: Ability to maintain clinical measurements within normal limits will improve Outcome: Progressing Goal: Will remain free from infection Outcome: Progressing Goal: Diagnostic test results will improve Outcome: Progressing Goal: Respiratory complications will improve Outcome: Progressing Goal: Cardiovascular complication will be avoided Outcome: Progressing   Problem: Activity: Goal: Risk for activity intolerance will decrease Outcome: Progressing   Problem: Nutrition: Goal: Adequate nutrition will be maintained Outcome: Progressing   Problem: Coping: Goal: Level of anxiety will decrease Outcome: Progressing   Problem: Elimination: Goal: Will not experience complications related to bowel motility Outcome: Progressing Goal: Will not experience complications related to urinary retention Outcome: Progressing   Problem: Pain Managment: Goal: General experience of comfort will improve Outcome: Progressing   Problem: Safety: Goal: Ability to remain free from injury will improve Outcome: Progressing   Problem: Skin Integrity: Goal: Risk for impaired skin integrity will decrease Outcome: Progressing   Problem: Education: Goal: Knowledge of disease or condition will improve Outcome: Progressing Goal: Knowledge of secondary prevention will improve (SELECT ALL) Outcome: Progressing Goal: Knowledge of patient specific risk factors will improve (INDIVIDUALIZE FOR PATIENT) Outcome: Progressing Goal: Individualized Educational Video(s) Outcome: Progressing   

## 2021-11-17 ENCOUNTER — Other Ambulatory Visit (HOSPITAL_COMMUNITY): Payer: Self-pay

## 2021-11-17 DIAGNOSIS — G459 Transient cerebral ischemic attack, unspecified: Secondary | ICD-10-CM | POA: Diagnosis not present

## 2021-11-17 LAB — CBC
HCT: 30.5 % — ABNORMAL LOW (ref 36.0–46.0)
Hemoglobin: 9.4 g/dL — ABNORMAL LOW (ref 12.0–15.0)
MCH: 27.3 pg (ref 26.0–34.0)
MCHC: 30.8 g/dL (ref 30.0–36.0)
MCV: 88.7 fL (ref 80.0–100.0)
Platelets: 205 10*3/uL (ref 150–400)
RBC: 3.44 MIL/uL — ABNORMAL LOW (ref 3.87–5.11)
RDW: 13.4 % (ref 11.5–15.5)
WBC: 11.1 10*3/uL — ABNORMAL HIGH (ref 4.0–10.5)
nRBC: 0 % (ref 0.0–0.2)

## 2021-11-17 LAB — BASIC METABOLIC PANEL
Anion gap: 7 (ref 5–15)
BUN: 26 mg/dL — ABNORMAL HIGH (ref 8–23)
CO2: 22 mmol/L (ref 22–32)
Calcium: 8.4 mg/dL — ABNORMAL LOW (ref 8.9–10.3)
Chloride: 110 mmol/L (ref 98–111)
Creatinine, Ser: 1.81 mg/dL — ABNORMAL HIGH (ref 0.44–1.00)
GFR, Estimated: 29 mL/min — ABNORMAL LOW (ref >60)
Glucose, Bld: 213 mg/dL — ABNORMAL HIGH (ref 70–99)
Potassium: 4 mmol/L (ref 3.5–5.1)
Sodium: 139 mmol/L (ref 135–145)

## 2021-11-17 LAB — GLUCOSE, CAPILLARY
Glucose-Capillary: 197 mg/dL — ABNORMAL HIGH (ref 70–99)
Glucose-Capillary: 178 mg/dL — ABNORMAL HIGH (ref 70–99)
Glucose-Capillary: 193 mg/dL — ABNORMAL HIGH (ref 70–99)
Glucose-Capillary: 136 mg/dL — ABNORMAL HIGH (ref 70–99)
Glucose-Capillary: 179 mg/dL — ABNORMAL HIGH (ref 70–99)

## 2021-11-17 NOTE — Consult Note (Signed)
? ?Chief Complaint: ?Left internal carotid stenosis. Request is for left internal carotid angiogram with intervention.  ? ?Referring Physician(s): ?Dr. Viona Gilmore ? ?Supervising Physician: Luanne Bras ? ?Patient Status: [facility] - In-pt ? ?History of Present Illness: ?Sonia Skinner is a 71 y.o. female History of DM, GERD, L MCA CVA,  Transferred to ED at [facility] from [facility] for dizziness, visual  and speech disturbances. On 4.3.23 IR Attending Dr. Arlean Hopping performed a  4 vessel cerebral angiogram.  Patient was found to have  approximately 75 to 80% stenosis of the left internal carotid artery proximally.50% stenosis of the right internal carotid artery proximal cavernous segment.Approximately 70% stenosis of the left vertebral artery at its origin.Approximately 40% stenosis of the left internal carotid artery proximal cavernous segment. Team is requesting a cerebral angiogram with intervention. Team is requesting a left internal carotid angiogram with intervention.  ? ?Patient alert and laying in bed, calm and comfortable. States that since her arteriogram her speech has improved and the tremors in her left arm have resolved. Denies any fevers, headache, chest pain, SOB, cough, abdominal pain, nausea, vomiting or bleeding. Return precautions and treatment recommendations and follow-up discussed with the patient  who is agreeable with the plan. ? ? ? ?Past Medical History:  ?Diagnosis Date  ? Ankle fracture   ? Left  ? Anxiety   ? Carotid artery occlusion   ? Closed fracture of left distal fibula 09/16/2017  ? Complication of anesthesia   ? ALLERY TO ESTER BASE  ? Depression   ? early 17s  ? Diabetes mellitus   ? INSULIN DEPENDENT  ? GERD (gastroesophageal reflux disease)   ? Headache   ? years ago  ? Hypertension   ? Pneumonia   ? Stroke [facility]) March 2015  ?  left MCA infarct, slight weakness on left side  ? Thyroid disease   ? ? ?Past Surgical History:  ?Procedure Laterality Date  ? ABDOMINAL  HYSTERECTOMY    ? ANTERIOR FIXATION AND POSTERIOR MICRODISCECTOMY CERVICAL SPINE  1999  ? CAROTID ENDARTERECTOMY Left 11-04-13  ? cea  ? ENDARTERECTOMY Left 11/04/2013  ? ORIF ANKLE FRACTURE Left 09/16/2017  ? Procedure: OPEN REDUCTION INTERNAL FIXATION (ORIF) LEFT ANKLE FRACTURE;  Surgeon: Marchia Bond, MD;  Location: [place];  Service: Orthopedics;  Laterality: Left;  ? ? ?Allergies: ?Anesthetics, ester ? ?Medications: ?Prior to Admission medications   ?Medication Sig Start Date End Date Taking? Authorizing Provider  ?Accu-Chek FastClix Lancets MISC Use to check blood sugar five times daily. 04/21/21  Yes [name], MD  ?acetaminophen (TYLENOL) 500 MG tablet Take 1,000-1,500 mg by mouth every 6 (six) hours as needed for mild pain.   Yes [provider]  ?Acetaminophen-Codeine (TYLENOL/CODEINE #3) 300-30 MG tablet Take 1 tablet by mouth every 6 (six) hours as needed for pain. 10/05/21  Yes Biagio Borg, MD  ?amitriptyline (ELAVIL) 100 MG tablet Take 1 tablet (100 mg total) by mouth at bedtime. ?Patient taking differently: Take 100 mg by mouth at bedtime as needed for sleep. 08/28/21  Yes Arfeen, Arlyce Harman, MD  ?amLODipine (NORVASC) 5 MG tablet Take 1 tablet (5 mg total) by mouth daily. 08/26/21  Yes Freada Bergeron, MD  ?aspirin EC 325 MG tablet Take 1 tablet (325 mg total) by mouth daily. 09/05/21 09/05/22 Yes Vashti Hey, MD  ?atorvastatin (LIPITOR) 40 MG tablet Take 1 tablet by mouth daily. ?Patient taking differently: Take 40 mg by mouth at bedtime. 09/19/21  Yes [name], MD  ?blood glucose meter kit and supplies KIT Use to test blood sugars 04/27/19  Yes [name], MD  ?buPROPion (WELLBUTRIN XL) 300 MG 24 hr tablet Take 1 tablet (300 mg total) by mouth daily. 08/28/21  Yes Arfeen, Arlyce Harman, MD  ?Cholecalciferol (VITAMIN D3 PO) Take 1 tablet by mouth daily. Unsure of dose   Yes [provider]  ?dexlansoprazole (DEXILANT) 60 MG capsule Take 1 capsule (60 mg total) by mouth  daily. 08/20/21  Yes [name], MD  ?EDARBYCLOR 40-12.5 MG TABS Take 1 tablet by mouth daily. 09/19/21  Yes [name], MD  ?[place] 10 MG TABS tablet Take 1 tablet by mouth daily before breakfast. 09/19/21  Yes [name], MD  ?gabapentin (NEURONTIN) 100 MG capsule Take 1 capsule (100 mg total) by mouth 3 (three) times daily. ?Patient taking differently: Take 100-200 mg by mouth See admin instructions. 200 mg int the morning ?100 mg at bedtime 10/05/21  Yes Biagio Borg, MD  ?glucose blood (ACCU-CHEK GUIDE) test strip Use to check blood sugars five times daily. 04/21/21  Yes [name], MD  ?Insulin Glargine (BASAGLAR KWIKPEN [place]) Inject 30-50 Units into the skin daily. 30-35 units if below 150 ?50 units if over 150   Yes [provider]  ?Insulin Pen Needle 32G X 4 MM MISC Use to administer insulin four times a day 04/02/20  Yes [name], MD  ?levothyroxine (SYNTHROID) 88 MCG tablet Take 1 tablet (88 mcg total) by mouth daily. 08/27/21  Yes [name], MD  ?loratadine (CLARITIN) 10 MG tablet Take 1 tablet (10 mg total) by mouth daily. 04/21/21  Yes [name], MD  ?Semaglutide, 2 MG/DOSE, 8 MG/3ML SOPN Inject 2 mg as directed once a week. ?Patient taking differently: Inject 2 mg as directed every 14 (fourteen) days. 08/28/21  Yes [name], MD  ?Continuous Blood Gluc Receiver (DEXCOM G6 RECEIVER) DEVI 1 Act by Does not apply route daily. 08/27/21   [name], MD  ?Continuous Blood Gluc Sensor (DEXCOM G6 SENSOR) MISC 1 Act by Does not apply route daily. 08/27/21   [name], MD  ?Continuous Blood Gluc Transmit (DEXCOM G6 TRANSMITTER) MISC 1 Act by Does not apply route daily. 08/27/21   [name], MD  ?Insulin NPH, Human,, Isophane, (NOVOLIN N FLEXPEN) 100 UNIT/ML Kiwkpen Inject 35 Units into the skin every morning. And pen needles 1/day ?Patient not taking: Reported on 11/14/2021 11/02/21   Renato Shin, MD  ?  ? ?Family History  ?Problem Relation Age of Onset  ?  Diabetes Mother   ? Heart disease Mother   ?     Before age 55  ? Cancer Father   ?     Lung  ? Hypertension Sister   ? Diabetes Sister   ? Diabetes Sister   ? Diabetes Sister   ? ? ?Social History  ? ?Socioeconomic History  ? Marital status: Legally Separated  ?  Spouse name: Not on file  ? Number of children: 0  ? Years of education: College  ? Highest education level: Not on file  ?Occupational History  ? Occupation: G[employer]  ?  Employer: [employer]  ?Tobacco Use  ? Smoking status: Former  ?  Packs/day: 1.00  ?  Years: 20.00  ?  Pack years: 20.00  ?  Types: Cigarettes  ?  Quit date: 11/01/2013  ?  Years since quitting: 8.0  ?  Smokeless tobacco: Never  ?Vaping Use  ? Vaping Use: Never used  ?Substance and Sexual Activity  ? Alcohol use: Not Currently  ?  Alcohol/week: 1.0 - 2.0 standard drink  ?  Types: 1 - 2 Standard drinks or equivalent per week  ? Drug use: No  ? Sexual activity: Not Currently  ?Other Topics Concern  ? Not on file  ?Social History Narrative  ? Patient lives in 1 story home with sister.  ? Caffeine Use: 2 cups daily; sodas occasionally  ? Some college education  ? Works as Oceanographer for [employer]  ? ?Social Determinants of Health  ? ?Financial Resource Strain: Not on file  ?Food Insecurity: No Food Insecurity  ? Worried About Charity fundraiser in the Last Year: Never true  ? Ran Out of Food in the Last Year: Never true  ?Transportation Needs: No Transportation Needs  ? Lack of Transportation (Medical): No  ? Lack of Transportation (Non-Medical): No  ?Physical Activity: Not on file  ?Stress: Stress Concern Present  ? Feeling of Stress : Very much  ?Social Connections: Not on file  ? ? ?Review of Systems: A 12 point ROS discussed and pertinent positives are indicated in the HPI above.  All other systems are negative. ? ?Review of Systems  ?Constitutional:  Negative for fatigue and fever.  ?HENT:  Negative for congestion.   ?Respiratory:  Negative for cough and shortness of breath.    ?Gastrointestinal:  Negative for abdominal pain, diarrhea, nausea and vomiting.  ? ?Vital Signs: ?BP (!) 142/65 (BP Location: Right Arm)   Pulse 84   Temp 98.8 ?F (37.1 ?C) (Oral)   Resp 12   Ht $R'5\' 2"'pC$  (1.575 m)   Wt 125

## 2021-11-17 NOTE — Progress Notes (Signed)
STROKE TEAM PROGRESS NOTE  ? ?INTERVAL HISTORY ?Patient is seen in her room with no family at the bedside.  She has been hemodynamically stable with no acute events overnight.  Her neurological exam is stable.   ?Diagnostic cerebral catheter angiogram y`day confirms 75-80% carotid stenosis with ulcerated plaque and plan n do elective angioplasty stenting coming Thursday.Marland Kitchen ?Vitals:  ? 11/16/21 2309 11/17/21 0309 11/17/21 0746 11/17/21 1204  ?BP: (!) 120/49 (!) 126/40 (!) 145/52 (!) 142/65  ?Pulse: 84 78 75 84  ?Resp: '14 18 15 12  '$ ?Temp: 98.3 ?F (36.8 ?C) 97.8 ?F (36.6 ?C) 98 ?F (36.7 ?C) 98.8 ?F (37.1 ?C)  ?TempSrc: Oral Oral Oral Oral  ?SpO2: 98% 98% 98%   ?Weight:      ?Height:      ? ?CBC:  ?Recent Labs  ?Lab 11/13/21 ?1356 11/16/21 ?0100 11/17/21 ?0340  ?WBC 10.4 9.7 11.1*  ?NEUTROABS 6.5  --   --   ?HGB 11.3* 10.0* 9.4*  ?HCT 36.2 32.1* 30.5*  ?MCV 88.1 87.9 88.7  ?PLT 240 204 205  ? ?Basic Metabolic Panel:  ?Recent Labs  ?Lab 11/16/21 ?0100 11/17/21 ?0340  ?NA 138 139  ?K 4.1 4.0  ?CL 110 110  ?CO2 21* 22  ?GLUCOSE 186* 213*  ?BUN 33* 26*  ?CREATININE 2.13* 1.81*  ?CALCIUM 8.6* 8.4*  ? ?Lipid Panel:  ?Recent Labs  ?Lab 11/14/21 ?1434  ?CHOL 183  ?TRIG 198*  ?HDL 42  ?CHOLHDL 4.4  ?VLDL 40  ?LDLCALC 101*  ? ?HgbA1c:  ?Recent Labs  ?Lab 11/14/21 ?1434  ?HGBA1C 7.8*  ? ?Urine Drug Screen: No results for input(s): LABOPIA, COCAINSCRNUR, LABBENZ, AMPHETMU, THCU, LABBARB in the last 168 hours.  ?Alcohol Level No results for input(s): ETH in the last 168 hours. ? ?IMAGING past 24 hours ?No results found. ? ?PHYSICAL EXAM ?General: Frail elderly African-American lady   in no acute distress ?Respiratory:  Regular, unlabored respirations on room air ? ?NEURO:  ?Mental Status: AA&Ox3  ?Speech/Language: speech is without dysarthria or aphasia.  Naming, repetition, fluency, and comprehension intact. ? ?Cranial Nerves:  ?II: PERRL. Visual fields full.  ?III, IV, VI: EOMI. Eyelids elevate symmetrically.  ?V: Sensation is  intact to light touch and symmetrical to face.  ?VII: Smile is symmetrical.   ?VIII: hearing intact to voice. ?IX, X: Phonation is normal.  ?DJ:MEQASTMH shrug 5/5. ?XII: tongue is midline without fasciculations. ?Motor: 5/5 strength to all muscle groups tested.  ?Tone: is normal and bulk is normal ?Sensation- Intact to light touch bilaterally.   ?Coordination: FTN intact bilaterally ?Gait- deferred ? ? ?ASSESSMENT/PLAN ?Sonia Skinner is a 71 y.o. female with history of DM2, GERD, small L MCA stroke, HTN, headaches and hypothyroidism presenting with an episode of impaired speech where her tongue felt thick and she was unable to get the words out.  This resolved spontaneously.  She has had several of these episodes over the last month.  MRI of the brain shows no acute stroke, but MRA of the neck shows 70% stenosis of the left ICA with possible ulcerated plaque.  Will consult IR to discuss options for this. ? ?Left hemispheric TIA:  in setting of 70% left ICA stenosis with ulcerated plaque ?CT head No acute abnormality. Atrophy. Old left parietal cortex infarction ?MRI  Chronic left MCA infarct with small remote right thalamic lacunar infarcts, 1.3 cm left parotid lesion ?MRA  Negative for LVO or emergent findings, severe 70% stenosis of left ICA with atheromatous ulcer, 50-60%  stenosis of bilateral carotid siphons, 44m outpouching from right ICA, 70% stenosis of left V1 segment ?2D Echo pending ?LDL 109 ?HgbA1c 8.0 ?VTE prophylaxis - SCDs, SQH ?   ?Diet  ? Diet Carb Modified Fluid consistency: Thin; Room service appropriate? Yes  ? ?aspirin 325 mg daily prior to admission, now on aspirin 81 mg daily and clopidogrel 75 mg daily.  ?Therapy recommendations:  no PT follow up ?Disposition:  pending ? ?Hypertension ?Home meds:  Edarbyclor 40-12.5 mg daily ?Stable ?Keep BP <180-105 ?Long-term BP goal normotensive ? ?Hyperlipidemia ?Home meds:  atorvastatin 80 mg daily, resumed in hospital ?LDL 109, goal < 70 ?Add zetia 10  mg daily  ?Continue statin at discharge ? ?Diabetes type II Uncontrolled ?Home meds:  Farxiga 10 mg daily, insulin glargine 30-50 units daily ?HgbA1c 8.0, goal < 7.0 ?CBGs ?Recent Labs  ?  11/17/21 ?0867604/04/23 ?0195004/04/23 ?1210  ?GLUCAP 197* 178* 193*  ?  ?SSI ? ?Other Stroke Risk Factors ?Advanced Age >/= 658 ?Former cigarette smoker  ?Hx stroke ? ?Other Active Problems ?Left parotid lesion seen on MRI ?Recommend outpatient ENT follow up ?Hypothyroidism ?Continue home synthroid ? ?Hospital day # 2 ? ?Continue aspirin and Brilinta and   elective left carotid angioplasty and stenting on Thursday this week.  Aggressive risk factor modification.  Discussed with patient and Dr. DEstanislado Pandy  Greater than 50% time during this 35-minute visit was spent on counseling and coordination of care and discussion with patient and care team about a TIA and symptomatic aortic stenosis and answered questions. ?PAntony Contras MD ?To contact Stroke Continuity provider, please refer to [email or web address] ?After hours, contact General Neurology  ?

## 2021-11-17 NOTE — Progress Notes (Signed)
Speech Language Evaluation: ? ? 11/17/21 1454  ?SLP Visit Information  ?SLP Received On 11/17/21  ?Reason Eval/Treat Not Completed Patient at procedure or test/unavailable  ?SLP Time Calculation  ?SLP Start Time (ACUTE ONLY) 1531  ?SLP Stop Time (ACUTE ONLY) 1543  ?SLP Time Calculation (min) (ACUTE ONLY) 12 min  ?Subjective  ?Subjective alert  ?General Information  ?HPI is a 71yo female presenting with "vision whiteout" while driving then felt that her tongue was thick and unable to get words out MRI and CT negative for stroke. MRA showed 70% stenosis of proximal cervical ICA. PMH: DM, L MCA 10/2010, thyroid disease, PSH: 10/2013 carotid endarterectomy.  ?Prior Functional Status  ?Cognitive/Linguistic Baseline WFL  ?Type of Home House  ? Lives With Family  ?Available Help at Discharge Family;Available 24 hours/day  ?Pain Assessment  ?Pain Assessment No/denies pain  ?Oral Motor/Sensory Function  ?Overall Oral Motor/Sensory Function WFL  ?Cognition  ?Overall Cognitive Status Within Functional Limits for tasks assessed  ?Arousal/Alertness Awake/alert  ?Orientation Level Oriented X4  ?Memory Appears intact  ?Awareness Appears intact  ?Auditory Comprehension  ?Overall Auditory Comprehension Appears within functional limits for tasks assessed  ?Yes/No Questions WFL  ?Commands WFL  ?Conversation Complex  ?Visual Recognition/Discrimination  ?Discrimination WFL  ?Reading Comprehension  ?Reading Status Not tested  ?Expression  ?Primary Mode of Expression Verbal  ?Verbal Expression  ?Overall Verbal Expression Appears within functional limits for tasks assessed  ?Initiation No impairment  ?Level of Generative/Spontaneous Verbalization Conversation  ?Repetition No impairment  ?Naming No impairment  ?Pragmatics No impairment  ?Written Expression  ?Dominant Hand Left  ?Motor Speech  ?Overall Motor Speech Appears within functional limits for tasks assessed  ?SLP - End of Session  ?Patient left in bed;with call bell/phone within  reach;with family/visitor present  ?Assessment  ?Clinical Impression Statement (ACUTE ONLY) Pt presents with normal expressive/receptive language with intact comprehension of complex commands and conversation. Expression is fluent and without anomia.  Speech is clear. Cognition intact with good awareness and memory. Pt recalled parts of acronym BE-FAST - we reviewed.  No SLP f/u is needed. will sign off.  ?SLP Recommendation/Assessment Patient does not need any further Speech Lanaguage Pathology Services  ?SLP Visit Diagnosis Cognitive communication deficit (R41.841)  ?No Skilled Speech Therapy All education completed  ?Individuals Consulted  ?Consulted and Agree with Results and Recommendations Patient  ? ?Jarrell Armond L. Shaquasia Caponigro, MA CCC/SLP ?Acute Rehabilitation Services ?Office number [phone] ?Pager [phone] ? ?

## 2021-11-17 NOTE — TOC Benefit Eligibility Note (Signed)
Patient Advocate Encounter ? ?Insurance verification completed.   ? ?The patient is currently admitted and upon discharge could be taking Brilinta 90 mg. ? ?The current 30 day co-pay is, $0.00.  ? ?The patient is insured through [employer] Part D  ? ? ? ?Lyndel Safe, CPhT ?Pharmacy Patient Advocate Specialist ?[facility] Patient Advocate Team ?Direct Number: [phone]  Fax: [phone] ? ? ? ? ? ?  ?

## 2021-11-17 NOTE — Progress Notes (Signed)
?PROGRESS NOTE ? ? ? ?ARDELLA CHHIM  EQA:[id number] DOB: [birth year] DOA: 11/13/2021 ?PCP: [name], MD  ? ? ?Brief Narrative:  ?Sonia Skinner is a 71 year old female with past medical history significant for type 2 diabetes mellitus, essential hypertension, ICA stenosis s/p left carotid endarterectomy 2015, CKD stage IIIb, GERD, history of CVA who presented to [facility] ED on 3/31 with a visual changes described as "vision white out", feeling of tongue thickening, gait disturbance and speech impairment.  Visual changes onset roughly 3 days prior.  Speech impairment reported lasting about 30 minutes.  Additionally with fatigue and dizziness which prompted her to seek care in the ED. ? ?In the ED, temperature 98.7 ?F, HR 98, RR 20, BP 189/66, SPO2 99% on room air.  WBC 10.4, hemoglobin 11.3, platelets 240. Sodium 142, potassium 4.5, chloride 109, CO2 26, glucose 90, BUN 24, creatinine 2.02.  High sensitive troponin less than 2 x 2.  Urinalysis unrevealing.  CT head without contrast with no acute intracranial findings, noted old infarct left parietal cortex unchanged.  Chest x-ray with no evidence of active cardiopulmonary disease process.  Neurology was consulted.  Patient was transferred to [facility] for further stroke versus TIA work-up.  ? ? ?  ?Assessment and Plan: ?* TIA (transient ischemic attack) ?Patient presenting to ED with reported visual changes, tongue thickening, speech impairment, fatigue and dizziness.  CT head without contrast with no acute intracranial findings, old parietal cortex infarct noted.  MR brain no acute infarct, chronic left MCA infarct.  MRA head/neck 70% stenosis proximal cervical left ICA.  TTE with LVEF greater than 75%, no regional wall motion abnormalities, mild LVH, grade 1 diastolic dysfunction, trivial MR, no aortic stenosis, intra-atrial septum not well visualized.  Hemoglobin A1c 7.8.  LDL 101.  EEG with no seizures or epileptiform discharges. ?--Neurology  following, appreciate assistance ?--DAPT with aspirin 81 mg p.o. daily and Brilinta 90 mg p.o. twice daily ?--PT/OT with no follow-up recommended ?--Monitor on telemetry ? ?Left carotid stenosis ?MRA with severe 70% stenosis left ICA with atheromatous ulcer, 50-60% stenosis of bilateral carotid siphons.  Underwent diagnostic cerebral angiogram 4/3 with findings 75-80% stenosis left ICA proximal, 50% stenosis right ICA, 70% stenosis left vertebral artery, 40% stenosis left ICA proximal cavernous segment ?--Continue aspirin 81 mg p.o. daily ?--Brilinta 90 mg p.o. twice daily ?--Atorvastatin 80 mg p.o. daily ?--Pending left carotid angioplasty with possible stent placement 4/6 by Dr. [name] ? ?Stage 3b chronic kidney disease ([place]) ?Creatinine 2.02 on admission.  Creatinine 1.4-1.8. ?--Cr 2.02>2.28>2.13>1.81 ?--Avoid nephrotoxins, renally dose all medications ?--Repeat BMP in a.m. ? ?GERD (gastroesophageal reflux disease) ?On Dexilant 60 mg p.o. daily at home. ?--Continue Protonix 40 mg p.o. daily as hospital substitution ? ?Major depressive disorder, recurrent episode ([place]) ?--Continue Wellbutrin. ? ?Hyperlipidemia with target LDL less than 70 ?Lipid panel with total cholesterol 183, LDL 101, triglycerides 198, HDL 42. ?--Atorvastatin 80 mg p.o. daily ?--Zetia 10 mg p.o. daily ? ?Hypothyroidism ?TSH 1.088, free T4 1.13. ?--Continue home levothyroxine 88 mcg p.o. daily ? ?Type II diabetes mellitus with manifestations ([place]) ?Home regimen includes insulin glargine 30-50 units supposedly daily, Farxiga 10 mg p.o. daily, semaglutide 2 mg every 2 weeks. ?--Hemoglobin A1c: 7.8 ?--SSI for coverage ?--CBG before every meal/at bedtime ? ?Essential hypertension, benign ?Home regimen includes amlodipine 10 mg p.o. daily. ?--BP 126/90 this am ?--Continue to hold home amlodipine and monitor BP closely ? ?Mass of left parotid gland ?1.3 cm left parietal lesion noted  on MRI head/neck. Will need outpatient ENT referral. ? ? ? ? ?DVT  prophylaxis: heparin injection 5,000 Units Start: 11/14/21 0715 ?SCD's Start: 11/14/21 0708 ? ?  Code Status: Full Code ?Family Communication: No family present at bedside this morning ? ?Disposition Plan:  ?Level of care: Telemetry Medical ?Status is: Observation ?The patient remains OBS appropriate and will d/c before 2 midnights. ?  ? ?Consultants:  ?Neurology ?Neuro interventional radiology, Dr. [name] ? ?Procedures:  ?TTE ? ?Antimicrobials:  ?none ? ? ?Subjective: ?Patient seen examined bedside, resting comfortably.  No specific complaints this morning.  Discussed with Dr. [name] today; pending left carotid angioplasty with likely stent placement.   Denies headache, no chest pain, no palpitations, no shortness of breath, no abdominal pain, no focal weakness, no fatigue, no fever/chills/night sweats, no nausea/vomiting/diarrhea, no paresthesias.  No acute events overnight per nursing staff. ? ?Objective: ?Vitals:  ? 11/16/21 2100 11/16/21 2309 11/17/21 0309 11/17/21 0746  ?BP: (!) 129/49 (!) 120/49 (!) 126/40 (!) 145/52  ?Pulse: 97 84 78 75  ?Resp: (!) '22 14 18 15  '$ ?Temp:  98.3 ?F (36.8 ?C) 97.8 ?F (36.6 ?C) 98 ?F (36.7 ?C)  ?TempSrc:  Oral Oral Oral  ?SpO2: 100% 98% 98% 98%  ?Weight:      ?Height:      ? ? ?Intake/Output Summary (Last 24 hours) at 11/17/2021 1118 ?Last data filed at 11/17/2021 0300 ?Gross per 24 hour  ?Intake 1552.1 ml  ?Output --  ?Net 1552.1 ml  ? ?Filed Weights  ? 11/13/21 1334  ?Weight: 56.7 kg  ? ? ?Examination: ? ?Physical Exam: ?GEN: NAD, alert and oriented x 3, wd/wn ?HEENT: NCAT, PERRL, EOMI, sclera clear, MMM ?PULM: CTAB w/o wheezes/crackles, normal respiratory effort, on room air ?CV: RRR w/o M/G/R ?GI: abd soft, NTND, NABS, no R/G/M ?MSK: no peripheral edema, muscle strength globally intact 5/5 bilateral upper/lower extremities ?NEURO: CN II-XII intact, no focal deficits, sensation to light touch intact ?PSYCH: normal mood/affect ?Integumentary: dry/intact, no rashes or  wounds ? ? ? ?Data Reviewed: I have personally reviewed following labs and imaging studies ? ?CBC: ?Recent Labs  ?Lab 11/13/21 ?1356 11/16/21 ?0100 11/17/21 ?0340  ?WBC 10.4 9.7 11.1*  ?NEUTROABS 6.5  --   --   ?HGB 11.3* 10.0* 9.4*  ?HCT 36.2 32.1* 30.5*  ?MCV 88.1 87.9 88.7  ?PLT 240 204 205  ? ?Basic Metabolic Panel: ?Recent Labs  ?Lab 11/13/21 ?1843 11/15/21 ?0139 11/16/21 ?0100 11/17/21 ?0340  ?NA 142 138 138 139  ?K 4.5 4.1 4.1 4.0  ?CL 109 106 110 110  ?CO2 26 24 21* 22  ?GLUCOSE 90 215* 186* 213*  ?BUN 24* 27* 33* 26*  ?CREATININE 2.02* 2.28* 2.13* 1.81*  ?CALCIUM 9.7 9.0 8.6* 8.4*  ? ?GFR: ?Estimated Creatinine Clearance: 22.2 mL/min (A) (by C-G formula based on SCr of 1.81 mg/dL (H)). ?Liver Function Tests: ?No results for input(s): AST, ALT, ALKPHOS, BILITOT, PROT, ALBUMIN in the last 168 hours. ?No results for input(s): LIPASE, AMYLASE in the last 168 hours. ?No results for input(s): AMMONIA in the last 168 hours. ?Coagulation Profile: ?Recent Labs  ?Lab 11/16/21 ?0100  ?INR 1.0  ? ?Cardiac Enzymes: ?No results for input(s): CKTOTAL, CKMB, CKMBINDEX, TROPONINI in the last 168 hours. ?BNP (last 3 results) ?No results for input(s): PROBNP in the last 8760 hours. ?HbA1C: ?Recent Labs  ?  11/14/21 ?1434  ?HGBA1C 7.8*  ? ?CBG: ?Recent Labs  ?Lab 11/16/21 ?1740 11/16/21 ?2010 11/16/21 ?2311 11/17/21 ?8099 11/17/21 ?8338  ?GLUCAP  144* 110* 204* 197* 178*  ? ?Lipid Profile: ?Recent Labs  ?  11/14/21 ?1434  ?CHOL 183  ?HDL 42  ?LDLCALC 101*  ?TRIG 198*  ?CHOLHDL 4.4  ? ?Thyroid Function Tests: ?Recent Labs  ?  11/14/21 ?1434  ?TSH 1.088  ?FREET4 1.13*  ? ?Anemia Panel: ?No results for input(s): VITAMINB12, FOLATE, FERRITIN, TIBC, IRON, RETICCTPCT in the last 72 hours. ?Sepsis Labs: ?No results for input(s): PROCALCITON, LATICACIDVEN in the last 168 hours. ? ?No results found for this or any previous visit (from the past 240 hour(s)).  ? ? ? ? ? ?Radiology Studies: ?No results found. ? ? ? ? ? ?Scheduled Meds: ?  aspirin EC  81 mg Oral Daily  ? atorvastatin  80 mg Oral QHS  ? buPROPion  300 mg Oral Daily  ? ezetimibe  10 mg Oral Daily  ? gabapentin  200 mg Oral q morning  ? And  ? gabapentin  100 mg Oral QHS  ? heparin

## 2021-11-18 LAB — CBC WITH DIFFERENTIAL/PLATELET
Abs Immature Granulocytes: 0.03 10*3/uL (ref 0.00–0.07)
Basophils Absolute: 0.1 10*3/uL (ref 0.0–0.1)
Basophils Relative: 1 %
Eosinophils Absolute: 0.5 10*3/uL (ref 0.0–0.5)
Eosinophils Relative: 5 %
HCT: 31 % — ABNORMAL LOW (ref 36.0–46.0)
Hemoglobin: 9.5 g/dL — ABNORMAL LOW (ref 12.0–15.0)
Immature Granulocytes: 0 %
Lymphocytes Relative: 31 %
Lymphs Abs: 3.4 10*3/uL (ref 0.7–4.0)
MCH: 27 pg (ref 26.0–34.0)
MCHC: 30.6 g/dL (ref 30.0–36.0)
MCV: 88.1 fL (ref 80.0–100.0)
Monocytes Absolute: 0.9 10*3/uL (ref 0.1–1.0)
Monocytes Relative: 8 %
Neutro Abs: 5.9 10*3/uL (ref 1.7–7.7)
Neutrophils Relative %: 55 %
Platelets: 207 10*3/uL (ref 150–400)
RBC: 3.52 MIL/uL — ABNORMAL LOW (ref 3.87–5.11)
RDW: 13.3 % (ref 11.5–15.5)
WBC: 10.8 10*3/uL — ABNORMAL HIGH (ref 4.0–10.5)
nRBC: 0 % (ref 0.0–0.2)

## 2021-11-18 LAB — BASIC METABOLIC PANEL
Anion gap: 7 (ref 5–15)
BUN: 21 mg/dL (ref 8–23)
CO2: 20 mmol/L — ABNORMAL LOW (ref 22–32)
Calcium: 9 mg/dL (ref 8.9–10.3)
Chloride: 112 mmol/L — ABNORMAL HIGH (ref 98–111)
Creatinine, Ser: 1.63 mg/dL — ABNORMAL HIGH (ref 0.44–1.00)
GFR, Estimated: 33 mL/min — ABNORMAL LOW (ref >60)
Glucose, Bld: 158 mg/dL — ABNORMAL HIGH (ref 70–99)
Potassium: 4.6 mmol/L (ref 3.5–5.1)
Sodium: 139 mmol/L (ref 135–145)

## 2021-11-18 LAB — CBC
HCT: 28.3 % — ABNORMAL LOW (ref 36.0–46.0)
Hemoglobin: 9 g/dL — ABNORMAL LOW (ref 12.0–15.0)
MCH: 28 pg (ref 26.0–34.0)
MCHC: 31.8 g/dL (ref 30.0–36.0)
MCV: 88.2 fL (ref 80.0–100.0)
Platelets: 184 10*3/uL (ref 150–400)
RBC: 3.21 MIL/uL — ABNORMAL LOW (ref 3.87–5.11)
RDW: 13.2 % (ref 11.5–15.5)
WBC: 10.7 10*3/uL — ABNORMAL HIGH (ref 4.0–10.5)
nRBC: 0 % (ref 0.0–0.2)

## 2021-11-18 LAB — PROTIME-INR
INR: 1 (ref 0.8–1.2)
Prothrombin Time: 13.2 seconds (ref 11.4–15.2)

## 2021-11-18 LAB — GLUCOSE, CAPILLARY
Glucose-Capillary: 156 mg/dL — ABNORMAL HIGH (ref 70–99)
Glucose-Capillary: 158 mg/dL — ABNORMAL HIGH (ref 70–99)
Glucose-Capillary: 184 mg/dL — ABNORMAL HIGH (ref 70–99)
Glucose-Capillary: 138 mg/dL — ABNORMAL HIGH (ref 70–99)

## 2021-11-18 MED ORDER — CEFAZOLIN SODIUM-DEXTROSE 2-4 GM/100ML-% IV SOLN
2.0000 g | INTRAVENOUS | Status: AC
  Administered 2021-11-19: 2 g via INTRAVENOUS
  Filled 2021-11-18: qty 100

## 2021-11-18 MED ORDER — ASPIRIN EC 81 MG PO TBEC
81.0000 mg | DELAYED_RELEASE_TABLET | ORAL | Status: AC
  Administered 2021-11-19: 81 mg via ORAL
  Filled 2021-11-18: qty 1

## 2021-11-18 MED ORDER — SODIUM CHLORIDE 0.9 % IV SOLN: INTRAVENOUS | Status: DC

## 2021-11-18 MED ORDER — NIMODIPINE 30 MG PO CAPS
60.0000 mg | ORAL_CAPSULE | ORAL | Status: DC
  Filled 2021-11-18: qty 2

## 2021-11-18 NOTE — Progress Notes (Signed)
?PROGRESS NOTE ? ? ? ?Sonia Skinner  WGY:[id number] DOB: 02-21-1951 DOA: 11/13/2021 ?PCP: [name], MD  ? ? ?Chief Complaint  ?Patient presents with  ? Dizziness  ? ? ?Brief Narrative:  ?Sonia Skinner is a 71 year old female with past medical history significant for type 2 diabetes mellitus, essential hypertension, ICA stenosis s/p left carotid endarterectomy 2015, CKD stage IIIb, GERD, history of CVA who presented to [facility] ED on 3/31 with a visual changes described as "vision white out", feeling of tongue thickening, gait disturbance and speech impairment.  Visual changes onset roughly 3 days prior.  Speech impairment reported lasting about 30 minutes.  Additionally with fatigue and dizziness which prompted her to seek care in the ED. ?  ?In the ED, temperature 98.7 ?F, HR 98, RR 20, BP 189/66, SPO2 99% on room air.  WBC 10.4, hemoglobin 11.3, platelets 240. Sodium 142, potassium 4.5, chloride 109, CO2 26, glucose 90, BUN 24, creatinine 2.02.  High sensitive troponin less than 2 x 2.  Urinalysis unrevealing.  CT head without contrast with no acute intracranial findings, noted old infarct left parietal cortex unchanged.  Chest x-ray with no evidence of active cardiopulmonary disease process.  Neurology was consulted.  Patient was transferred to [facility] for further stroke versus TIA work-up.  ?  ? ? ?Assessment & Plan: ?  ?Principal Problem: ?  TIA (transient ischemic attack) ?Active Problems: ?  Left carotid stenosis ?  Essential hypertension, benign ?  Type II diabetes mellitus with manifestations ([place]) ?  Hypothyroidism ?  Hyperlipidemia with target LDL less than 70 ?  Major depressive disorder, recurrent episode ([place]) ?  GERD (gastroesophageal reflux disease) ?  Stage 3b chronic kidney disease ([place]) ?  Mass of left parotid gland ? ?TIA (transient ischemic attack) ?Patient presenting to ED with reported visual changes, tongue thickening, speech impairment, fatigue and dizziness.  CT  head without contrast with no acute intracranial findings, old parietal cortex infarct noted.  MR brain no acute infarct, chronic left MCA infarct.  MRA head/neck 70% stenosis proximal cervical left ICA.  TTE with LVEF greater than 75%, no regional wall motion abnormalities, mild LVH, grade 1 diastolic dysfunction, trivial MR, no aortic stenosis, intra-atrial septum not well visualized.  Hemoglobin A1c 7.8.  LDL 101.  EEG with no seizures or epileptiform discharges. ?--Neurology following, appreciate assistance ?--DAPT with aspirin 81 mg p.o. daily and Brilinta 90 mg p.o. twice daily ?--PT/OT with no follow-up recommended ?--Monitor on telemetry ?  ?Left carotid stenosis ?MRA with severe 70% stenosis left ICA with atheromatous ulcer, 50-60% stenosis of bilateral carotid siphons.  Underwent diagnostic cerebral angiogram 4/3 with findings 75-80% stenosis left ICA proximal, 50% stenosis right ICA, 70% stenosis left vertebral artery, 40% stenosis left ICA proximal cavernous segment ?--Continue aspirin 81 mg p.o. daily ?--Brilinta 90 mg p.o. twice daily ?--Atorvastatin 80 mg p.o. daily ?--plan for left angiogram gram with intervention likely stent placement by Dr. [name] tomorrow/01/2022, will keep n.p.o. after midnight. . ? ?  ?Stage 3b chronic kidney disease ([place]) ?Creatinine 2.02 on admission.  Creatinine 1.4-1.8. ?--Cr 2.02>2.28>2.13>1.81 ?--Avoid nephrotoxins, renally dose all medications ?--Repeat BMP in a.m. ?  ?GERD (gastroesophageal reflux disease) ?On Dexilant 60 mg p.o. daily at home. ?--Continue Protonix 40 mg p.o. daily as hospital substitution ?  ?Major depressive disorder, recurrent episode ([place]) ?--Continue Wellbutrin. ?  ?Hyperlipidemia with target LDL less than 70 ?Lipid panel with total cholesterol 183, LDL 101, triglycerides 198, HDL 42. ?--Atorvastatin  80 mg p.o. daily ?--Zetia 10 mg p.o. daily ?  ?Hypothyroidism ?TSH 1.088, free T4 1.13. ?--Continue home levothyroxine 88 mcg p.o. daily ?  ?Type II  diabetes mellitus with manifestations ([place]) ?Home regimen includes insulin glargine 30-50 units supposedly daily, Farxiga 10 mg p.o. daily, semaglutide 2 mg every 2 weeks. ?--Hemoglobin A1c: 7.8 ?--SSI for coverage ?--CBG before every meal/at bedtime ?  ?Essential hypertension, benign ?Home regimen includes amlodipine 10 mg p.o. daily. ?--BP 126/90 this am ?--Continue to hold home amlodipine and monitor BP closely ?  ?Mass of left parotid gland ?1.3 cm left parietal lesion noted on MRI head/neck. Will need outpatient ENT referral. ?  ? ? ? ?DVT prophylaxis: [place] Heparin ?Code Status: Full ?Family Communication: None at bedside ?Disposition:  ? ?Status is: Inpatient ?Remains inpatient appropriate because: Plan for carotid intervention tomorrow. ?Consultants:  ?Neurology ?Neuro interventional radiology ? ?Subjective: ? ?She denies any complaints today, ? ?Objective: ?Vitals:  ? 11/18/21 0003 11/18/21 0328 11/18/21 0753 11/18/21 1206  ?BP: (!) 145/50 (!) 145/50 (!) 149/63 (!) 161/59  ?Pulse: 83 75 79   ?Resp: '20 18 15   '$ ?Temp: 98.3 ?F (36.8 ?C) 98.3 ?F (36.8 ?C) 98.1 ?F (36.7 ?C) 98.5 ?F (36.9 ?C)  ?TempSrc: Oral Oral Oral Oral  ?SpO2:   100%   ?Weight:      ?Height:      ? ?No intake or output data in the 24 hours ending 11/18/21 1417 ?Filed Weights  ? 11/13/21 1334  ?Weight: 56.7 kg  ? ? ?Examination: ? ?General exam: Appears calm and comfortable  ?Respiratory system: Clear to auscultation. Respiratory effort normal. ?Cardiovascular system: S1 & S2 heard, RRR. No JVD, murmurs, rubs, gallops or clicks. No pedal edema. ?Gastrointestinal system: Abdomen is nondistended, soft and nontender. No organomegaly or masses felt. Normal bowel sounds heard. ?Central nervous system: Alert and oriented. No focal neurological deficits. ?Extremities: Symmetric 5 x 5 power. ?Skin: No rashes, lesions or ulcers ?Psychiatry: Judgement and insight appear normal. Mood & affect appropriate.  ? ? ? ?Data Reviewed: I have personally reviewed  following labs and imaging studies ? ?CBC: ?Recent Labs  ?Lab 11/13/21 ?1356 11/16/21 ?0100 11/17/21 ?5784 11/18/21 ?6962  ?WBC 10.4 9.7 11.1* 10.7*  ?NEUTROABS 6.5  --   --   --   ?HGB 11.3* 10.0* 9.4* 9.0*  ?HCT 36.2 32.1* 30.5* 28.3*  ?MCV 88.1 87.9 88.7 88.2  ?PLT 240 204 205 184  ? ? ?Basic Metabolic Panel: ?Recent Labs  ?Lab 11/13/21 ?1843 11/15/21 ?0139 11/16/21 ?0100 11/17/21 ?9528 11/18/21 ?4132  ?NA 142 138 138 139 139  ?K 4.5 4.1 4.1 4.0 4.6  ?CL 109 106 110 110 112*  ?CO2 26 24 21* 22 20*  ?GLUCOSE 90 215* 186* 213* 158*  ?BUN 24* 27* 33* 26* 21  ?CREATININE 2.02* 2.28* 2.13* 1.81* 1.63*  ?CALCIUM 9.7 9.0 8.6* 8.4* 9.0  ? ? ?GFR: ?Estimated Creatinine Clearance: 24.7 mL/min (A) (by C-G formula based on SCr of 1.63 mg/dL (H)). ? ?Liver Function Tests: ?No results for input(s): AST, ALT, ALKPHOS, BILITOT, PROT, ALBUMIN in the last 168 hours. ? ?CBG: ?Recent Labs  ?Lab 11/17/21 ?1210 11/17/21 ?1549 11/17/21 ?2043 11/18/21 ?0756 11/18/21 ?1208  ?GLUCAP 193* 136* 179* 156* 158*  ? ? ? ?No results found for this or any previous visit (from the past 240 hour(s)).  ? ? ? ? ? ?Radiology Studies: ?IR ANGIO INTRA EXTRACRAN SEL COM CAROTID INNOMINATE BILAT MOD SED ? ?Result Date: 11/18/2021 ?CLINICAL DATA:  History  of TIAs. Severe left internal carotid proximal stenosis by ultrasound and MRA of the head and neck. EXAM: BILATERAL COMMON CAROTID AND INNOMINATE ANGIOGRAPHY COMPARISON:  MRI and MRA of the brain November 13, 2021. MEDICATIONS: Heparin 1000 units IV. No antibiotic was administered within 1 hour of the procedure. ANESTHESIA/SEDATION: Versed 1.5 mg IV; Fentanyl 37.5 mcg IV; hydralazine 5 mg for hypertension. Moderate Sedation Time:  25 minutes The patient was continuously monitored during the procedure by the interventional radiology nurse under my direct supervision. CONTRAST:  Omnipaque 300 approximately 65 mL FLUOROSCOPY TIME:  Fluoroscopy Time: 7 minutes 30 seconds (671.3 mGy). COMPLICATIONS: None immediate.  TECHNIQUE: Informed written consent was obtained from the patient after a thorough discussion of the procedural risks, benefits and alternatives. All questions were addressed. Maximal [employer]

## 2021-11-18 NOTE — Progress Notes (Signed)
STROKE TEAM PROGRESS NOTE  ? ?INTERVAL HISTORY ? ?Patient is sitting up comfortably in a bedside chair.  She is doing well.  She has no complaints.  She is scheduled for planned left carotid angioplasty stenting tomorrow by Dr. [name].  Vital signs are stable.  Neuro exam is unchanged ? ? ?Vitals:  ? 11/17/21 2045 11/18/21 0003 11/18/21 0328 11/18/21 0753  ?BP: (!) 147/55 (!) 145/50 (!) 145/50 (!) 149/63  ?Pulse:  83 75 79  ?Resp: '17 20 18 15  '$ ?Temp: 98.5 ?F (36.9 ?C) 98.3 ?F (36.8 ?C) 98.3 ?F (36.8 ?C) 98.1 ?F (36.7 ?C)  ?TempSrc: Oral Oral Oral Oral  ?SpO2:    100%  ?Weight:      ?Height:      ? ?CBC:  ?Recent Labs  ?Lab 11/13/21 ?1356 11/16/21 ?0100 11/17/21 ?1941 11/18/21 ?7408  ?WBC 10.4   < > 11.1* 10.7*  ?NEUTROABS 6.5  --   --   --   ?HGB 11.3*   < > 9.4* 9.0*  ?HCT 36.2   < > 30.5* 28.3*  ?MCV 88.1   < > 88.7 88.2  ?PLT 240   < > 205 184  ? < > = values in this interval not displayed.  ? ?Basic Metabolic Panel:  ?Recent Labs  ?Lab 11/17/21 ?1448 11/18/21 ?1856  ?NA 139 139  ?K 4.0 4.6  ?CL 110 112*  ?CO2 22 20*  ?GLUCOSE 213* 158*  ?BUN 26* 21  ?CREATININE 1.81* 1.63*  ?CALCIUM 8.4* 9.0  ? ?Lipid Panel:  ?Recent Labs  ?Lab 11/14/21 ?1434  ?CHOL 183  ?TRIG 198*  ?HDL 42  ?CHOLHDL 4.4  ?VLDL 40  ?LDLCALC 101*  ? ?HgbA1c:  ?Recent Labs  ?Lab 11/14/21 ?1434  ?HGBA1C 7.8*  ? ?Urine Drug Screen: No results for input(s): LABOPIA, COCAINSCRNUR, LABBENZ, AMPHETMU, THCU, LABBARB in the last 168 hours.  ?Alcohol Level No results for input(s): ETH in the last 168 hours. ? ?IMAGING past 24 hours ?No results found. ? ?PHYSICAL EXAM ?General: Frail elderly African-American lady   in no acute distress ?Respiratory:  Regular, unlabored respirations on room air ? ?NEURO:  ?Mental Status: AA&Ox3  ?Speech/Language: speech is without dysarthria or aphasia.  Naming, repetition, fluency, and comprehension intact. ? ?Cranial Nerves:  ?II: PERRL. Visual fields full.  ?III, IV, VI: EOMI. Eyelids elevate symmetrically.  ?V:  Sensation is intact to light touch and symmetrical to face.  ?VII: Smile is symmetrical.   ?VIII: hearing intact to voice. ?IX, X: Phonation is normal.  ?DJ:[id number] shrug 5/5. ?XII: tongue is midline without fasciculations. ?Motor: 5/5 strength to all muscle groups tested.  ?Tone: is normal and bulk is normal ?Sensation- Intact to light touch bilaterally.   ?Coordination: FTN intact bilaterally ?Gait- deferred ? ? ?ASSESSMENT/PLAN ?[name] is a 71 y.o. female with history of DM2, GERD, small L MCA stroke, HTN, headaches and hypothyroidism presenting with an episode of impaired speech where her tongue felt thick and she was unable to get the words out.  This resolved spontaneously.  She has had several of these episodes over the last month.  MRI of the brain shows no acute stroke, but MRA of the neck shows 70% stenosis of the left ICA with possible ulcerated plaque.  Will consult IR to discuss options for this. ? ?Left hemispheric TIA:  in setting of 70% left ICA stenosis with ulcerated plaque ?CT head No acute abnormality. Atrophy. Old left parietal cortex infarction ?MRI  Chronic left MCA infarct  with small remote right thalamic lacunar infarcts, 1.3 cm left parotid lesion ?MRA  Negative for LVO or emergent findings, severe 70% stenosis of left ICA with atheromatous ulcer, 50-60% stenosis of bilateral carotid siphons, 66m outpouching from right ICA, 70% stenosis of left V1 segment ?2D Echo pending ?LDL 109 ?HgbA1c 8.0 ?VTE prophylaxis - SCDs, SQH ?   ?Diet  ? Diet Carb Modified Fluid consistency: Thin; Room service appropriate? Yes  ? ?aspirin 325 mg daily prior to admission, now on aspirin 81 mg daily and clopidogrel 75 mg daily.  ?Therapy recommendations:  no PT follow up ?Disposition:  pending ? ?Hypertension ?Home meds:  Edarbyclor 40-12.5 mg daily ?Stable ?Keep BP <180-105 ?Long-term BP goal normotensive ? ?Hyperlipidemia ?Home meds:  atorvastatin 80 mg daily, resumed in hospital ?LDL 109, goal <  70 ?Add zetia 10 mg daily  ?Continue statin at discharge ? ?Diabetes type II Uncontrolled ?Home meds:  Farxiga 10 mg daily, insulin glargine 30-50 units daily ?HgbA1c 8.0, goal < 7.0 ?CBGs ?Recent Labs  ?  11/17/21 ?1549 11/17/21 ?2043 11/18/21 ?0756  ?GLUCAP 136* 179* 156*  ?  ?SSI ? ?Other Stroke Risk Factors ?Advanced Age >/= 640 ?Former cigarette smoker  ?Hx stroke ? ?Other Active Problems ?Left parotid lesion seen on MRI ?Recommend outpatient ENT follow up ?Hypothyroidism ?Continue home synthroid ? ?Hospital day # 3 ? ?Continue aspirin and Brilinta and elective left carotid angioplasty and stenting tomorrow by Dr. DEstanislado Pandy  Aggressive risk factor modification.     Greater than 50% time during this 25-minute visit was spent on counseling and coordination of care and discussion with patient and care team about a TIA and symptomatic aortic stenosis and answered questions. ?PAntony Contras MD ?To contact Stroke Continuity provider, please refer to [email or web address] ?After hours, contact General Neurology  ?

## 2021-11-18 NOTE — Care Management Important Message (Signed)
Important Message ? ?Patient Details  ?Name: Sonia Skinner ?MRN: [id number] ?Date of Birth: 1950-09-23 ? ? ?Medicare Important Message Given:  Yes ? ? ? ? ?Tae Robak ?11/18/2021, 3:53 PM ?

## 2021-11-18 NOTE — Progress Notes (Signed)
Patient seen at bedside this afternoon, reviewed left ICA angioplasty/possible stent placement planned for tomorrow in NIR - all questions answers, patient confirms she is ready to proceed. ? ?Patient requesting something for sleep, states takes amitriptyline at home for sleep -- per [facility] review amitriptyline 100 mg ordered bedtime PRN for sleep by primary team. Patient made aware that she would need to ask for this medication if desired. ? ?Plan: ?NPO at midnight, sips with meds ?Give ASA 81 mg + Brilinta 90 mg prior to NIR procedure at 8 am on 4/6 ?Hold heparin SQ after 2200 dose on 4/5 ? ?Please call IR with questions or concerns. ? ?Candiss Norse, PA-C ?

## 2021-11-19 ENCOUNTER — Encounter (HOSPITAL_COMMUNITY): Payer: Self-pay | Admitting: Internal Medicine

## 2021-11-19 ENCOUNTER — Other Ambulatory Visit (HOSPITAL_COMMUNITY): Payer: Self-pay | Admitting: Emergency Medicine

## 2021-11-19 ENCOUNTER — Inpatient Hospital Stay (HOSPITAL_COMMUNITY): Payer: Medicare Other | Admitting: Certified Registered Nurse Anesthetist

## 2021-11-19 ENCOUNTER — Other Ambulatory Visit (HOSPITAL_COMMUNITY): Payer: Self-pay

## 2021-11-19 ENCOUNTER — Encounter (HOSPITAL_COMMUNITY)
Admission: EM | Disposition: A | Discharge status: Home / Self Care | Payer: Self-pay | Source: Home / Self Care | Attending: Internal Medicine

## 2021-11-19 ENCOUNTER — Inpatient Hospital Stay (HOSPITAL_COMMUNITY): Payer: Medicare Other

## 2021-11-19 DIAGNOSIS — N1832 Chronic kidney disease, stage 3b: Secondary | ICD-10-CM

## 2021-11-19 DIAGNOSIS — I129 Hypertensive chronic kidney disease with stage 1 through stage 4 chronic kidney disease, or unspecified chronic kidney disease: Secondary | ICD-10-CM

## 2021-11-19 DIAGNOSIS — E1122 Type 2 diabetes mellitus with diabetic chronic kidney disease: Secondary | ICD-10-CM

## 2021-11-19 DIAGNOSIS — Z95820 Peripheral vascular angioplasty status with implants and grafts: Secondary | ICD-10-CM

## 2021-11-19 DIAGNOSIS — Z7984 Long term (current) use of oral hypoglycemic drugs: Secondary | ICD-10-CM

## 2021-11-19 DIAGNOSIS — I6522 Occlusion and stenosis of left carotid artery: Secondary | ICD-10-CM

## 2021-11-19 DIAGNOSIS — Z87891 Personal history of nicotine dependence: Secondary | ICD-10-CM

## 2021-11-19 HISTORY — PX: IR CT HEAD LTD: IMG2386

## 2021-11-19 HISTORY — PX: IR INTRAVSC STENT CERV CAROTID W/EMB-PROT MOD SED INCL ANGIO: IMG2303

## 2021-11-19 HISTORY — PX: IR ANGIO INTRA EXTRACRAN SEL COM CAROTID INNOMINATE UNI L MOD SED: IMG5358

## 2021-11-19 HISTORY — PX: RADIOLOGY WITH ANESTHESIA: SHX6223

## 2021-11-19 LAB — POCT I-STAT, CHEM 8
BUN: 16 mg/dL (ref 8–23)
Calcium, Ion: 1.23 mmol/L (ref 1.15–1.40)
Chloride: 114 mmol/L — ABNORMAL HIGH (ref 98–111)
Creatinine, Ser: 1.3 mg/dL — ABNORMAL HIGH (ref 0.44–1.00)
Glucose, Bld: 134 mg/dL — ABNORMAL HIGH (ref 70–99)
HCT: 21 % — ABNORMAL LOW (ref 36.0–46.0)
Hemoglobin: 7.1 g/dL — ABNORMAL LOW (ref 12.0–15.0)
Potassium: 3.8 mmol/L (ref 3.5–5.1)
Sodium: 145 mmol/L (ref 135–145)
TCO2: 20 mmol/L — ABNORMAL LOW (ref 22–32)

## 2021-11-19 LAB — BASIC METABOLIC PANEL
Anion gap: 7 (ref 5–15)
BUN: 19 mg/dL (ref 8–23)
CO2: 21 mmol/L — ABNORMAL LOW (ref 22–32)
Calcium: 9 mg/dL (ref 8.9–10.3)
Chloride: 113 mmol/L — ABNORMAL HIGH (ref 98–111)
Creatinine, Ser: 1.59 mg/dL — ABNORMAL HIGH (ref 0.44–1.00)
GFR, Estimated: 34 mL/min — ABNORMAL LOW (ref >60)
Glucose, Bld: 166 mg/dL — ABNORMAL HIGH (ref 70–99)
Potassium: 4.5 mmol/L (ref 3.5–5.1)
Sodium: 141 mmol/L (ref 135–145)

## 2021-11-19 LAB — CBC
HCT: 29.3 % — ABNORMAL LOW (ref 36.0–46.0)
Hemoglobin: 9.6 g/dL — ABNORMAL LOW (ref 12.0–15.0)
MCH: 28.3 pg (ref 26.0–34.0)
MCHC: 32.8 g/dL (ref 30.0–36.0)
MCV: 86.4 fL (ref 80.0–100.0)
Platelets: 194 10*3/uL (ref 150–400)
RBC: 3.39 MIL/uL — ABNORMAL LOW (ref 3.87–5.11)
RDW: 13.1 % (ref 11.5–15.5)
WBC: 10.8 10*3/uL — ABNORMAL HIGH (ref 4.0–10.5)
nRBC: 0 % (ref 0.0–0.2)

## 2021-11-19 LAB — TYPE AND SCREEN
ABO/RH(D): B POS
Antibody Screen: NEGATIVE

## 2021-11-19 LAB — MAGNESIUM: Magnesium: 1.2 mg/dL — ABNORMAL LOW (ref 1.7–2.4)

## 2021-11-19 LAB — GLUCOSE, CAPILLARY
Glucose-Capillary: 184 mg/dL — ABNORMAL HIGH (ref 70–99)
Glucose-Capillary: 118 mg/dL — ABNORMAL HIGH (ref 70–99)
Glucose-Capillary: 244 mg/dL — ABNORMAL HIGH (ref 70–99)

## 2021-11-19 LAB — POCT ACTIVATED CLOTTING TIME
Activated Clotting Time: 245 seconds
Activated Clotting Time: 251 seconds
Activated Clotting Time: 293 seconds

## 2021-11-19 LAB — BRAIN NATRIURETIC PEPTIDE: B Natriuretic Peptide: 104.1 pg/mL — ABNORMAL HIGH (ref 0.0–100.0)

## 2021-11-19 LAB — MRSA NEXT GEN BY PCR, NASAL: MRSA by PCR Next Gen: NOT DETECTED

## 2021-11-19 SURGERY — IR WITH ANESTHESIA
Anesthesia: General

## 2021-11-19 MED ORDER — SODIUM CHLORIDE 0.9 % IV SOLN: INTRAVENOUS | Status: DC | PRN

## 2021-11-19 MED ORDER — ROCURONIUM BROMIDE 100 MG/10ML IV SOLN
INTRAVENOUS | Status: DC | PRN
  Administered 2021-11-19: 20 mg via INTRAVENOUS
  Administered 2021-11-19: 60 mg via INTRAVENOUS

## 2021-11-19 MED ORDER — ONDANSETRON HCL 4 MG/2ML IJ SOLN
INTRAMUSCULAR | Status: DC | PRN
  Administered 2021-11-19: 4 mg via INTRAVENOUS

## 2021-11-19 MED ORDER — ACETAMINOPHEN 650 MG RE SUPP: 650.0000 mg | RECTAL | Status: DC | PRN

## 2021-11-19 MED ORDER — CEFAZOLIN SODIUM-DEXTROSE 2-4 GM/100ML-% IV SOLN
INTRAVENOUS | Status: AC
  Filled 2021-11-19: qty 100

## 2021-11-19 MED ORDER — ASPIRIN 81 MG PO CHEW
81.0000 mg | CHEWABLE_TABLET | Freq: Every day | ORAL | Status: DC
  Administered 2021-11-20: 81 mg via ORAL
  Filled 2021-11-19: qty 1

## 2021-11-19 MED ORDER — CLEVIDIPINE BUTYRATE 0.5 MG/ML IV EMUL
INTRAVENOUS | Status: AC
  Filled 2021-11-19: qty 50

## 2021-11-19 MED ORDER — ASPIRIN 81 MG PO CHEW: 81.0000 mg | CHEWABLE_TABLET | Freq: Every day | ORAL | Status: DC

## 2021-11-19 MED ORDER — CLEVIDIPINE BUTYRATE 0.5 MG/ML IV EMUL
INTRAVENOUS | Status: DC | PRN
  Administered 2021-11-19: 3 mg/h via INTRAVENOUS

## 2021-11-19 MED ORDER — CLEVIDIPINE BUTYRATE 0.5 MG/ML IV EMUL
0.0000 mg/h | INTRAVENOUS | Status: AC
  Administered 2021-11-19: 10 mg/h via INTRAVENOUS
  Administered 2021-11-19: 3 mg/h via INTRAVENOUS
  Administered 2021-11-19: 2 mg/h via INTRAVENOUS
  Administered 2021-11-20: 8 mg/h via INTRAVENOUS
  Filled 2021-11-19: qty 100

## 2021-11-19 MED ORDER — SODIUM CHLORIDE 0.9 % IV SOLN: INTRAVENOUS | Status: DC

## 2021-11-19 MED ORDER — FENTANYL CITRATE (PF) 100 MCG/2ML IJ SOLN
INTRAMUSCULAR | Status: DC | PRN
  Administered 2021-11-19: 100 ug via INTRAVENOUS

## 2021-11-19 MED ORDER — EPTIFIBATIDE 20 MG/10ML IV SOLN
INTRAVENOUS | Status: AC
  Filled 2021-11-19: qty 10

## 2021-11-19 MED ORDER — PHENYLEPHRINE HCL-NACL 20-0.9 MG/250ML-% IV SOLN
INTRAVENOUS | Status: DC | PRN
  Administered 2021-11-19: 25 ug/min via INTRAVENOUS

## 2021-11-19 MED ORDER — ORAL CARE MOUTH RINSE: 15.0000 mL | Freq: Once | OROMUCOSAL | Status: AC

## 2021-11-19 MED ORDER — PHENOL 1.4 % MT LIQD: 1.0000 | OROMUCOSAL | Status: DC | PRN

## 2021-11-19 MED ORDER — CHLORHEXIDINE GLUCONATE 0.12 % MT SOLN: 15.0000 mL | Freq: Once | OROMUCOSAL | Status: AC

## 2021-11-19 MED ORDER — IOHEXOL 300 MG/ML  SOLN
100.0000 mL | Freq: Once | INTRAMUSCULAR | Status: AC | PRN
  Administered 2021-11-19: 60 mL via INTRA_ARTERIAL

## 2021-11-19 MED ORDER — DEXAMETHASONE SODIUM PHOSPHATE 10 MG/ML IJ SOLN
INTRAMUSCULAR | Status: DC | PRN
  Administered 2021-11-19: 4 mg via INTRAVENOUS

## 2021-11-19 MED ORDER — INSULIN ASPART 100 UNIT/ML IJ SOLN
INTRAMUSCULAR | Status: AC
  Filled 2021-11-19: qty 1

## 2021-11-19 MED ORDER — CHLORHEXIDINE GLUCONATE CLOTH 2 % EX PADS
6.0000 | MEDICATED_PAD | Freq: Every day | CUTANEOUS | Status: DC
  Administered 2021-11-19: 6 via TOPICAL

## 2021-11-19 MED ORDER — EPHEDRINE SULFATE (PRESSORS) 50 MG/ML IJ SOLN
INTRAMUSCULAR | Status: DC | PRN
  Administered 2021-11-19 (×2): 5 mg via INTRAVENOUS

## 2021-11-19 MED ORDER — OXYCODONE HCL 5 MG/5ML PO SOLN
5.0000 mg | Freq: Four times a day (QID) | ORAL | Status: DC | PRN
  Administered 2021-11-19 – 2021-11-20 (×2): 5 mg via ORAL
  Filled 2021-11-19 (×2): qty 5

## 2021-11-19 MED ORDER — PROTAMINE SULFATE 10 MG/ML IV SOLN
INTRAVENOUS | Status: DC | PRN
  Administered 2021-11-19: 7.5 mg via INTRAVENOUS

## 2021-11-19 MED ORDER — ACETAMINOPHEN 160 MG/5ML PO SOLN: 650.0000 mg | ORAL | Status: DC | PRN

## 2021-11-19 MED ORDER — GLYCOPYRROLATE PF 0.2 MG/ML IJ SOSY
PREFILLED_SYRINGE | INTRAMUSCULAR | Status: DC | PRN
  Administered 2021-11-19: .2 mg via INTRAVENOUS

## 2021-11-19 MED ORDER — PROMETHAZINE HCL 25 MG/ML IJ SOLN: 6.2500 mg | Freq: Once | INTRAMUSCULAR | Status: DC

## 2021-11-19 MED ORDER — EPTIFIBATIDE 20 MG/10ML IV SOLN
INTRAVENOUS | Status: DC | PRN
  Administered 2021-11-19 (×2): 1.5 mg via INTRA_ARTERIAL

## 2021-11-19 MED ORDER — TICAGRELOR 90 MG PO TABS
90.0000 mg | ORAL_TABLET | Freq: Two times a day (BID) | ORAL | Status: DC
Start: 2021-11-19 — End: 2021-11-20
  Administered 2021-11-19 – 2021-11-20 (×2): 90 mg via ORAL
  Filled 2021-11-19 (×2): qty 1

## 2021-11-19 MED ORDER — INSULIN ASPART 100 UNIT/ML IJ SOLN
0.0000 [IU] | INTRAMUSCULAR | Status: DC | PRN
  Administered 2021-11-19: 2 [IU] via SUBCUTANEOUS

## 2021-11-19 MED ORDER — PROPOFOL 10 MG/ML IV BOLUS
INTRAVENOUS | Status: DC | PRN
  Administered 2021-11-19: 110 mg via INTRAVENOUS

## 2021-11-19 MED ORDER — HEPARIN SODIUM (PORCINE) 1000 UNIT/ML IJ SOLN
INTRAMUSCULAR | Status: DC | PRN
  Administered 2021-11-19: 3000 [IU] via INTRAVENOUS

## 2021-11-19 MED ORDER — HEPARIN (PORCINE) 25000 UT/250ML-% IV SOLN
500.0000 [IU]/h | INTRAVENOUS | Status: DC
  Administered 2021-11-19: 500 [IU]/h via INTRAVENOUS

## 2021-11-19 MED ORDER — HEPARIN (PORCINE) 25000 UT/250ML-% IV SOLN
INTRAVENOUS | Status: AC
  Filled 2021-11-19: qty 250

## 2021-11-19 MED ORDER — CHLORHEXIDINE GLUCONATE 0.12 % MT SOLN
OROMUCOSAL | Status: AC
  Administered 2021-11-19: 15 mL via OROMUCOSAL
  Filled 2021-11-19: qty 15

## 2021-11-19 MED ORDER — FENTANYL CITRATE (PF) 100 MCG/2ML IJ SOLN
25.0000 ug | INTRAMUSCULAR | Status: DC | PRN
  Administered 2021-11-19: 25 ug via INTRAVENOUS

## 2021-11-19 MED ORDER — TICAGRELOR 90 MG PO TABS: 90.0000 mg | ORAL_TABLET | Freq: Two times a day (BID) | ORAL | Status: DC

## 2021-11-19 MED ORDER — ACETAMINOPHEN 325 MG PO TABS
650.0000 mg | ORAL_TABLET | ORAL | Status: DC | PRN
  Administered 2021-11-19 – 2021-11-20 (×2): 650 mg via ORAL
  Filled 2021-11-19 (×2): qty 2

## 2021-11-19 MED ORDER — LIDOCAINE HCL (CARDIAC) PF 100 MG/5ML IV SOSY
PREFILLED_SYRINGE | INTRAVENOUS | Status: DC | PRN
  Administered 2021-11-19: 40 mg via INTRATRACHEAL

## 2021-11-19 MED ORDER — PHENYLEPHRINE 40 MCG/ML (10ML) SYRINGE FOR IV PUSH (FOR BLOOD PRESSURE SUPPORT)
PREFILLED_SYRINGE | INTRAVENOUS | Status: DC | PRN
Start: 2021-11-19 — End: 2021-11-19
  Administered 2021-11-19: 40 ug via INTRAVENOUS
  Administered 2021-11-19: 80 ug via INTRAVENOUS

## 2021-11-19 MED ORDER — NITROGLYCERIN 1 MG/10 ML FOR IR/CATH LAB
INTRA_ARTERIAL | Status: DC | PRN
  Administered 2021-11-19 (×2): 25 ug via INTRA_ARTERIAL

## 2021-11-19 MED ORDER — FENTANYL CITRATE (PF) 100 MCG/2ML IJ SOLN
INTRAMUSCULAR | Status: AC
Start: 2021-11-19 — End: 2021-11-20
  Filled 2021-11-19: qty 2

## 2021-11-19 MED ORDER — NITROGLYCERIN 1 MG/10 ML FOR IR/CATH LAB
INTRA_ARTERIAL | Status: AC
  Filled 2021-11-19: qty 10

## 2021-11-19 MED ORDER — IOHEXOL 300 MG/ML  SOLN
100.0000 mL | Freq: Once | INTRAMUSCULAR | Status: AC | PRN
Start: 2021-11-19 — End: 2021-11-19
  Administered 2021-11-19: 50 mL

## 2021-11-19 MED ORDER — LIDOCAINE HCL 1 % IJ SOLN
INTRAMUSCULAR | Status: AC
  Filled 2021-11-19: qty 20

## 2021-11-19 NOTE — Progress Notes (Signed)
Referring Physician(s): Deveshwar,Sanjeev  Supervising Physician: Julieanne Cotton  Patient Status:  [facility] - In-pt  Chief Complaint:  Status post stent assisted angioplasty with distal protection of the left internal carotid artery proximal stenosis with R CFA approach with Dr. Fatima Sanger 11/19/21.   Subjective:  Pt sleeping on stretcher in PACU. She denies pain, HA, blurred vision or other complaints.   Allergies: Anesthetics, ester  Medications: Prior to Admission medications   Medication Sig Start Date End Date Taking? Authorizing Provider  Accu-Chek FastClix Lancets MISC Use to check blood sugar five times daily. 04/21/21  Yes Etta Grandchild, MD  acetaminophen (TYLENOL) 500 MG tablet Take 1,000-1,500 mg by mouth every 6 (six) hours as needed for mild pain.   Yes [provider]  Acetaminophen-Codeine (TYLENOL/CODEINE #3) 300-30 MG tablet Take 1 tablet by mouth every 6 (six) hours as needed for pain. 10/05/21  Yes Corwin Levins, MD  amitriptyline (ELAVIL) 100 MG tablet Take 1 tablet (100 mg total) by mouth at bedtime. Patient taking differently: Take 100 mg by mouth at bedtime as needed for sleep. 08/28/21  Yes Arfeen, Phillips Grout, MD  amLODipine (NORVASC) 5 MG tablet Take 1 tablet (5 mg total) by mouth daily. 08/26/21  Yes Meriam Sprague, MD  aspirin EC 325 MG tablet Take 1 tablet (325 mg total) by mouth daily. 09/05/21 09/05/22 Yes Pieter Partridge, MD  atorvastatin (LIPITOR) 40 MG tablet Take 1 tablet by mouth daily. Patient taking differently: Take 40 mg by mouth at bedtime. 09/19/21  Yes Etta Grandchild, MD  blood glucose meter kit and supplies KIT Use to test blood sugars 04/27/19  Yes Etta Grandchild, MD  buPROPion (WELLBUTRIN XL) 300 MG 24 hr tablet Take 1 tablet (300 mg total) by mouth daily. 08/28/21  Yes Arfeen, Phillips Grout, MD  Cholecalciferol (VITAMIN D3 PO) Take 1 tablet by mouth daily. Unsure of dose   Yes [provider]  dexlansoprazole  (DEXILANT) 60 MG capsule Take 1 capsule (60 mg total) by mouth daily. 08/20/21  Yes Etta Grandchild, MD  EDARBYCLOR 40-12.5 MG TABS Take 1 tablet by mouth daily. 09/19/21  Yes Etta Grandchild, MD  FARXIGA 10 MG TABS tablet Take 1 tablet by mouth daily before breakfast. 09/19/21  Yes Etta Grandchild, MD  gabapentin (NEURONTIN) 100 MG capsule Take 1 capsule (100 mg total) by mouth 3 (three) times daily. Patient taking differently: Take 100-200 mg by mouth See admin instructions. 200 mg int the morning 100 mg at bedtime 10/05/21  Yes Corwin Levins, MD  glucose blood (ACCU-CHEK GUIDE) test strip Use to check blood sugars five times daily. 04/21/21  Yes Etta Grandchild, MD  Insulin Glargine (BASAGLAR KWIKPEN [place]) Inject 30-50 Units into the skin daily. 30-35 units if below 150 50 units if over 150   Yes [provider]  Insulin Pen Needle 32G X 4 MM MISC Use to administer insulin four times a day 04/02/20  Yes Etta Grandchild, MD  levothyroxine (SYNTHROID) 88 MCG tablet Take 1 tablet (88 mcg total) by mouth daily. 08/27/21  Yes Etta Grandchild, MD  loratadine (CLARITIN) 10 MG tablet Take 1 tablet (10 mg total) by mouth daily. 04/21/21  Yes Etta Grandchild, MD  Semaglutide, 2 MG/DOSE, 8 MG/3ML SOPN Inject 2 mg as directed once a week. Patient taking differently: Inject 2 mg as directed every 14 (fourteen) days. 08/28/21  Yes Etta Grandchild, MD  Continuous Blood Gluc Receiver (  DEXCOM G6 RECEIVER) DEVI 1 Act by Does not apply route daily. 08/27/21   Etta Grandchild, MD  Continuous Blood Gluc Sensor (DEXCOM G6 SENSOR) MISC 1 Act by Does not apply route daily. 08/27/21   Etta Grandchild, MD  Continuous Blood Gluc Transmit (DEXCOM G6 TRANSMITTER) MISC 1 Act by Does not apply route daily. 08/27/21   Etta Grandchild, MD  Insulin NPH, Human,, Isophane, (NOVOLIN N FLEXPEN) 100 UNIT/ML Kiwkpen Inject 35 Units into the skin every morning. And pen needles 1/day Patient not taking: Reported on 11/14/2021 11/02/21   Romero Belling, MD     Vital Signs: BP (!) 124/53 (BP Location: Left Arm)   Pulse 89   Temp 97.9 F (36.6 C)   Resp 16   Ht 5\' 2"  (1.575 m)   Wt 125 lb (56.7 kg)   SpO2 99%   BMI 22.86 kg/m   Physical Exam Constitutional:      Appearance: Normal appearance. She is not ill-appearing.  HENT:     Head: Normocephalic and atraumatic.     Comments: Cervical collar in place Eyes:     Extraocular Movements: Extraocular movements intact.     Pupils: Pupils are equal, round, and reactive to light.  Cardiovascular:     Rate and Rhythm: Normal rate and regular rhythm.  Pulmonary:     Effort: Pulmonary effort is normal. No respiratory distress.  Skin:    General: Skin is warm and dry.     Comments: R CFA puncture site is soft with no active bleeding and no appreciable pseudoaneurysm. Dressing is C/D/I   Neurological:     Mental Status: She is alert and oriented to person, place, and time.     Comments: Alert, aware and oriented X 3 Speech and comprehension is intact.  PERRL bilaterally No facial droop noted Tongue midline Can spontaneously move all 4 extremities. Hand grip strength equal bilaterally. Bilateral dorsiflexion 5/5. Plantar flection 5/5 bilaterally.  Fine motor and coordination grossly intact. Distal pulses (DP's) palpable bilaterally with Doppler Judgment and insight norma Negative pronator drift.        Psychiatric:        Mood and Affect: Mood normal.        Behavior: Behavior normal.        Thought Content: Thought content normal.        Judgment: Judgment normal.    Imaging: IR ANGIO INTRA EXTRACRAN SEL COM CAROTID INNOMINATE BILAT MOD SED  Result Date: 11/18/2021 CLINICAL DATA:  History of TIAs. Severe left internal carotid proximal stenosis by ultrasound and MRA of the head and neck. EXAM: BILATERAL COMMON CAROTID AND INNOMINATE ANGIOGRAPHY COMPARISON:  MRI and MRA of the brain November 13, 2021. MEDICATIONS: Heparin 1000 units IV. No antibiotic was administered  within 1 hour of the procedure. ANESTHESIA/SEDATION: Versed 1.5 mg IV; Fentanyl 37.5 mcg IV; hydralazine 5 mg for hypertension. Moderate Sedation Time:  25 minutes The patient was continuously monitored during the procedure by the interventional radiology nurse under my direct supervision. CONTRAST:  Omnipaque 300 approximately 65 mL FLUOROSCOPY TIME:  Fluoroscopy Time: 7 minutes 30 seconds (671.3 mGy). COMPLICATIONS: None immediate. TECHNIQUE: Informed written consent was obtained from the patient after a thorough discussion of the procedural risks, benefits and alternatives. All questions were addressed. Maximal Sterile Barrier Technique was utilized including caps, mask, sterile gowns, sterile gloves, sterile drape, hand hygiene and skin antiseptic. A timeout was performed prior to the initiation of the procedure. The right groin was  prepped and draped in the usual sterile fashion. Thereafter using modified Seldinger technique, transfemoral access into the right common femoral artery was obtained without difficulty. Over a 0.035 inch guidewire, a 5 French Pinnacle sheath was inserted. Through this, and also over 0.035 inch guidewire, a 5 [place] JB 1 catheter was advanced to the aortic arch region and selectively positioned in the right common carotid artery, the left common carotid artery, the right vertebral artery and the subclavian artery. FINDINGS: The innominate arteriogram demonstrates mild narrowing of the proximal right subclavian artery. Right common carotid arteriogram demonstrated wide patency proximally. The right vertebral artery origin is widely patent. The vessel is seen to opacify to the cranial skull base. Mild FMD-like changes are seen in the vertical segment of the right vertebral artery at C1. Distal to this the right vertebrobasilar junction and the right posterior-inferior cerebellar artery demonstrate wide patency. Unopacified blood is seen in the basilar artery from contralateral left  vertebral artery. The opacified basilar artery, the superior cerebellar arteries, the anterior inferior cerebellar arteries and the posterior cerebral arteries demonstrate opacification into the capillary and venous phases. Right common carotid arteriogram demonstrates moderate stenosis at the origin of the right external carotid artery. Its branches opacify widely. The right internal carotid artery at the bulb demonstrates approximately 40% stenosis secondary to a posteriorly positioned non calcified arteriosclerotic plaque. More distally, the right internal carotid artery opacifies to the cranial skull base. The petrous, the cavernous and the supraclinoid segments demonstrate patency. There is approximately 50% stenosis of the proximal cavernous right ICA associated with mild fusiform dilatation in the caval portion. More distally, the supraclinoid segment is widely patent. The right middle cerebral artery and the right anterior cerebral artery opacify into the capillary and venous phases. Left common carotid arteriogram demonstrates moderate stenosis at the origin of the left external carotid artery. Its branches opacify widely. At the left internal carotid artery just distal to the bulb is a moderate-sized ulcerated plaque associated with approximately 80% stenosis just distal to this. No evidence of intraluminal filling defect is seen. More distally, the left internal carotid artery opacifies to the cranial skull base. Approximately 3.4 mm x 2.3 mm outpouching is seen at the petrous cavernous junction. Just distal to this there is approximately 40% stenosis of the proximal left cavernous ICA. The supraclinoid left ICA demonstrates wide patency. Left middle cerebral artery and left anterior cerebral artery opacify into the capillary and venous phases. The left subclavian arteriogram demonstrates approximately 75% stenosis at the origin of the left vertebral artery associated with small ulcerated plaque. Distal  to this the vessel is seen to opacify to the cranial skull base. Patency is seen of the left vertebrobasilar junction and left posteroinferior cerebellar artery. The basilar artery, the posterior cerebral arteries, the superior cerebellar arteries and the anterior-inferior cerebellar arteries opacify into the capillary and venous phases. 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 IMPRESSION: Approximately 80% stenosis of the left internal carotid artery proximally associated with a moderate size ulceration. Approximately 50% stenosis of the right internal carotid artery proximal cavernous segment. Approximately 75% stenosis at the origin of the left vertebral artery associated with an ulcerated plaque. Approximately 40% stenosis the left internal carotid artery proximal cavernous segment. PLAN: Findings reviewed with the patient and her sister, and referring neurologist. Plan endovascular stent assisted angioplasty of the proximal left internal carotid artery. Electronically Signed   By: Julieanne Cotton M.D.   On: 11/18/2021 08:21   IR ANGIO  VERTEBRAL SEL VERTEBRAL BILAT MOD SED  Result Date: 11/18/2021 CLINICAL DATA:  History of TIAs. Severe left internal carotid proximal stenosis by ultrasound and MRA of the head and neck. EXAM: BILATERAL COMMON CAROTID AND INNOMINATE ANGIOGRAPHY COMPARISON:  MRI and MRA of the brain November 13, 2021. MEDICATIONS: Heparin 1000 units IV. No antibiotic was administered within 1 hour of the procedure. ANESTHESIA/SEDATION: Versed 1.5 mg IV; Fentanyl 37.5 mcg IV; hydralazine 5 mg for hypertension. Moderate Sedation Time:  25 minutes The patient was continuously monitored during the procedure by the interventional radiology nurse under my direct supervision. CONTRAST:  Omnipaque 300 approximately 65 mL FLUOROSCOPY TIME:  Fluoroscopy Time: 7 minutes 30 seconds (671.3 mGy). COMPLICATIONS: None immediate. TECHNIQUE: Informed written consent was  obtained from the patient after a thorough discussion of the procedural risks, benefits and alternatives. All questions were addressed. Maximal Sterile Barrier Technique was utilized including caps, mask, sterile gowns, sterile gloves, sterile drape, hand hygiene and skin antiseptic. A timeout was performed prior to the initiation of the procedure. The right groin was prepped and draped in the usual sterile fashion. Thereafter using modified Seldinger technique, transfemoral access into the right common femoral artery was obtained without difficulty. Over a 0.035 inch guidewire, a 5 French Pinnacle sheath was inserted. Through this, and also over 0.035 inch guidewire, a 5 [place] JB 1 catheter was advanced to the aortic arch region and selectively positioned in the right common carotid artery, the left common carotid artery, the right vertebral artery and the subclavian artery. FINDINGS: The innominate arteriogram demonstrates mild narrowing of the proximal right subclavian artery. Right common carotid arteriogram demonstrated wide patency proximally. The right vertebral artery origin is widely patent. The vessel is seen to opacify to the cranial skull base. Mild FMD-like changes are seen in the vertical segment of the right vertebral artery at C1. Distal to this the right vertebrobasilar junction and the right posterior-inferior cerebellar artery demonstrate wide patency. Unopacified blood is seen in the basilar artery from contralateral left vertebral artery. The opacified basilar artery, the superior cerebellar arteries, the anterior inferior cerebellar arteries and the posterior cerebral arteries demonstrate opacification into the capillary and venous phases. Right common carotid arteriogram demonstrates moderate stenosis at the origin of the right external carotid artery. Its branches opacify widely. The right internal carotid artery at the bulb demonstrates approximately 40% stenosis secondary to a posteriorly  positioned non calcified arteriosclerotic plaque. More distally, the right internal carotid artery opacifies to the cranial skull base. The petrous, the cavernous and the supraclinoid segments demonstrate patency. There is approximately 50% stenosis of the proximal cavernous right ICA associated with mild fusiform dilatation in the caval portion. More distally, the supraclinoid segment is widely patent. The right middle cerebral artery and the right anterior cerebral artery opacify into the capillary and venous phases. Left common carotid arteriogram demonstrates moderate stenosis at the origin of the left external carotid artery. Its branches opacify widely. At the left internal carotid artery just distal to the bulb is a moderate-sized ulcerated plaque associated with approximately 80% stenosis just distal to this. No evidence of intraluminal filling defect is seen. More distally, the left internal carotid artery opacifies to the cranial skull base. Approximately 3.4 mm x 2.3 mm outpouching is seen at the petrous cavernous junction. Just distal to this there is approximately 40% stenosis of the proximal left cavernous ICA. The supraclinoid left ICA demonstrates wide patency. Left middle cerebral artery and left anterior cerebral artery opacify into the capillary and  venous phases. The left subclavian arteriogram demonstrates approximately 75% stenosis at the origin of the left vertebral artery associated with small ulcerated plaque. Distal to this the vessel is seen to opacify to the cranial skull base. Patency is seen of the left vertebrobasilar junction and left posteroinferior cerebellar artery. The basilar artery, the posterior cerebral arteries, the superior cerebellar arteries and the anterior-inferior cerebellar arteries opacify into the capillary and venous phases. 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 IMPRESSION: Approximately 80% stenosis of the left internal  carotid artery proximally associated with a moderate size ulceration. Approximately 50% stenosis of the right internal carotid artery proximal cavernous segment. Approximately 75% stenosis at the origin of the left vertebral artery associated with an ulcerated plaque. Approximately 40% stenosis the left internal carotid artery proximal cavernous segment. PLAN: Findings reviewed with the patient and her sister, and referring neurologist. Plan endovascular stent assisted angioplasty of the proximal left internal carotid artery. Electronically Signed   By: Julieanne Cotton M.D.   On: 11/18/2021 08:21    Labs:  CBC: Recent Labs    11/17/21 0340 11/18/21 0237 11/18/21 1908 11/19/21 0619 11/19/21 1004  WBC 11.1* 10.7* 10.8* 10.8*  --   HGB 9.4* 9.0* 9.5* 9.6* 7.1*  HCT 30.5* 28.3* 31.0* 29.3* 21.0*  PLT 205 184 207 194  --     COAGS: Recent Labs    08/13/21 1948 11/16/21 0100 11/18/21 1908  INR 1.1 1.0 1.0  APTT 29  --   --     BMP: Recent Labs    11/16/21 0100 11/17/21 0340 11/18/21 0237 11/19/21 0619 11/19/21 1004  NA 138 139 139 141 145  K 4.1 4.0 4.6 4.5 3.8  CL 110 110 112* 113* 114*  CO2 21* 22 20* 21*  --   GLUCOSE 186* 213* 158* 166* 134*  BUN 33* 26* 21 19 16   CALCIUM 8.6* 8.4* 9.0 9.0  --   CREATININE 2.13* 1.81* 1.63* 1.59* 1.30*  GFRNONAA 24* 29* 33* 34*  --     LIVER FUNCTION TESTS: Recent Labs    04/07/21 1616 08/13/21 1948  BILITOT 0.3 0.5  AST 12 13*  ALT 10 14  ALKPHOS 91 96  PROT 7.0 6.7  ALBUMIN 4.1 3.7    Assessment and Plan:  Status post stent assisted angioplasty with distal protection of the left internal carotid artery proximal stenosis with R CFA approach with Dr. Fatima Sanger 11/19/21.   Pt asleep on stretcher. She awakens easily to verbal stimuli. She is A&O. Speech is clear. She has no complaints.    IR to follow   Electronically Signed: Shon Hough, NP 11/19/2021, 4:11 PM   I spent a total of 15 Minutes at the the  patient's bedside AND on the patient's hospital floor or unit, greater than 50% of which was counseling/coordinating care for Status post stent assisted angioplasty with distal protection of the left internal carotid artery proximal stenosis with R CFA approach with Dr. Fatima Sanger 11/19/21.

## 2021-11-19 NOTE — Consult Note (Signed)
? ?NAME:  Sonia Skinner, MRN:  [id number], DOB:  Jun 25, 1951, LOS: 4 ?ADMISSION DATE:  11/13/2021, CONSULTATION DATE:  11/19/2021 ?REFERRING MD:  Dr. Candiss Norse, CHIEF COMPLAINT:  Stroke   ? ?History of Present Illness:  ?Sonia Skinner is 71yo female with a PMH significant for CAD, depression, diabetes, HTN, left MCA stroke 2015, and thyroid disease who presented to the ED originally 11/13/21 for  C/C of dizziness, blurred vision, and lethargy that began 2 days prior to admission. Due to concern for stoke/TIA like symptoms patient was admitted per [facility] with neurology consulted.  ? ?Patient underwent left carotid arteriogram with stent placed, PCCM consulted for close monitoring in the ICU post stent placement  ? ?Pertinent  Medical History  ?CAD, depression, diabetes, HTN, left MCA stroke 2015, and thyroid disease ? ?Significant Hospital Events: ?Including procedures, antibiotic start and stop dates in addition to other pertinent events   ?3/31 admitted for visual disturbance, weakness with concerns for TIA, admitted per [facility]  ?4/3 underwent 4 vessel cerebral arteriogram per IR. Which revealed: ?1. Approximately 75 to 80% stenosis of the left internal carotid artery proximally. ?2.  50% stenosis of the right internal carotid artery proximal cavernous segment. ?3.  Approximately 70% stenosis of the left vertebral artery at its origin. ?4.  Approximately 40% stenosis of the left internal carotid artery proximal cavernous segment. ?4/6 underwent left carotid arteriogram with stent placed, PCCM consulted for close monitoring in the ICU post stent placement  ? ?Interim History / Subjective:  ?As above  ? ?Objective   ?Blood pressure (!) 109/44, pulse 78, temperature 97.9 ?F (36.6 ?C), resp. rate 18, height '5\' 2"'$  (1.575 m), weight 56.7 kg, SpO2 96 %. ?   ?   ? ?Intake/Output Summary (Last 24 hours) at 11/19/2021 1229 ?Last data filed at 11/19/2021 1153 ?Gross per 24 hour  ?Intake 2400 ml  ?Output 520 ml  ?Net 1880 ml  ? ?Filed Weights  ?  11/13/21 1334  ?Weight: 56.7 kg  ? ? ?Examination: ?General: Well appearing elderly female lying in bed, in NAD ?HEENT: [place]/AT, MM pink/moist, PERRL,  ?Neuro: Alert and oriented, non-focal  ?CV: s1s2 regular rate and rhythm, no murmur, rubs, or gallops,  ?PULM:  Clear to ascultation, no increased work of breathing, no added breath sounds ?GI: soft, bowel sounds active in all 4 quadrants, non-tender, non-distended ?Extremities: warm/dry, no edema  ?Skin: no rashes or lesions ? ?Resolved Hospital Problem list   ? ? ?Assessment & Plan:  ?TIA ?-Patient presented with initially complaints of dizziness, weakness, and difficulty speaking  ?-EEG negative ?Left carotid stenosis  ?-MRA with severe stenosis of left ICA with diagnostic cerebral angiogram 4/3 with 75-80% stenosis of left ICA  ?-Underwent stent placement to ICA per IR 4/6 ?P: ?Nephrology and Neuro IR following, appreciate assistance  ?Primary management per neurology  ?Maintain neuro protective measures; goal for eurothermia, euglycemia, eunatermia, normoxia, and PCO2 goal of 35-40 ?Nutrition and bowel regiment  ?Seizure precautions  ?Aspirations precautions  ?Secondary prevention  ?Continue DAPT with ASA and Brillinta  ?Continue statin  ?SBP goal 120-140, continue Celveprix  ? ?Stage 3b CKD ?-Creatinine 2.02 on admit, currently downtrended to 1.59 by 4/6 ?P: ?Follow renal function  ?Monitor urine output ?Trend Bmet ?Avoid nephrotoxins ?Ensure adequate renal perfusion  ? ?Hx of HTN/HLD  ?-Home medications includes; Atorvastatin, Amlodipine, AND ASA ?P: ?Continue home medications  ?Continuous telemetry  ?Continue ASA and statin  ? ?Hx of type 2 diabetes ?-Home medication includes; NPH ?P: ?  Continue  ? ?Hx of GERD ?P: ?PPI ? ? ?Best Practice (right click and "Reselect all SmartList Selections" daily)  ? ?Diet/type: NPO ?DVT prophylaxis: other ?GI prophylaxis: PPI ?Lines: N/A ?Foley:  N/A ?Code Status:  full code ?Last date of multidisciplinary goals of care  discussion: Update patient daily ? ?Labs   ?CBC: ?Recent Labs  ?Lab 11/13/21 ?1356 11/16/21 ?0100 11/17/21 ?0321 11/18/21 ?2248 11/18/21 ?1908 11/19/21 ?2500 11/19/21 ?1004  ?WBC 10.4 9.7 11.1* 10.7* 10.8* 10.8*  --   ?NEUTROABS 6.5  --   --   --  5.9  --   --   ?HGB 11.3* 10.0* 9.4* 9.0* 9.5* 9.6* 7.1*  ?HCT 36.2 32.1* 30.5* 28.3* 31.0* 29.3* 21.0*  ?MCV 88.1 87.9 88.7 88.2 88.1 86.4  --   ?PLT 240 204 205 184 207 194  --   ? ? ?Basic Metabolic Panel: ?Recent Labs  ?Lab 11/15/21 ?0139 11/16/21 ?0100 11/17/21 ?3704 11/18/21 ?8889 11/19/21 ?1694 11/19/21 ?1004  ?NA 138 138 139 139 141 145  ?K 4.1 4.1 4.0 4.6 4.5 3.8  ?CL 106 110 110 112* 113* 114*  ?CO2 24 21* 22 20* 21*  --   ?GLUCOSE 215* 186* 213* 158* 166* 134*  ?BUN 27* 33* 26* '21 19 16  '$ ?CREATININE 2.28* 2.13* 1.81* 1.63* 1.59* 1.30*  ?CALCIUM 9.0 8.6* 8.4* 9.0 9.0  --   ?MG  --   --   --   --  1.2*  --   ? ?GFR: ?Estimated Creatinine Clearance: 30.9 mL/min (A) (by C-G formula based on SCr of 1.3 mg/dL (H)). ?Recent Labs  ?Lab 11/17/21 ?5038 11/18/21 ?8828 11/18/21 ?1908 11/19/21 ?0034  ?WBC 11.1* 10.7* 10.8* 10.8*  ? ? ?Liver Function Tests: ?No results for input(s): AST, ALT, ALKPHOS, BILITOT, PROT, ALBUMIN in the last 168 hours. ?No results for input(s): LIPASE, AMYLASE in the last 168 hours. ?No results for input(s): AMMONIA in the last 168 hours. ? ?ABG ?   ?Component Value Date/Time  ? PHART 7.374 03/07/2016 2218  ? PCO2ART 41.3 03/07/2016 2218  ? PO2ART 73.0 (L) 03/07/2016 2218  ? HCO3 24.1 (H) 03/07/2016 2218  ? TCO2 20 (L) 11/19/2021 1004  ? ACIDBASEDEF 1.0 03/07/2016 2218  ? O2SAT 94.0 03/07/2016 2218  ?  ? ?Coagulation Profile: ?Recent Labs  ?Lab 11/16/21 ?0100 11/18/21 ?1908  ?INR 1.0 1.0  ? ? ?Cardiac Enzymes: ?No results for input(s): CKTOTAL, CKMB, CKMBINDEX, TROPONINI in the last 168 hours. ? ?HbA1C: ?Hemoglobin A1C  ?Date/Time Value Ref Range Status  ?11/02/2021 01:25 PM 8.0 (A) 4.0 - 5.6 % Final  ? ?Hgb A1c MFr Bld  ?Date/Time Value Ref Range  Status  ?11/14/2021 02:34 PM 7.8 (H) 4.8 - 5.6 % Final  ?  Comment:  ?  (NOTE) ?Pre diabetes:          5.7%-6.4% ? ?Diabetes:              >6.4% ? ?Glycemic control for   <7.0% ?adults with diabetes ?  ?08/15/2021 12:44 AM 8.5 (H) 4.8 - 5.6 % Final  ?  Comment:  ?  (NOTE) ?Pre diabetes:          5.7%-6.4% ? ?Diabetes:              >6.4% ? ?Glycemic control for   <7.0% ?adults with diabetes ?  ? ? ?CBG: ?Recent Labs  ?Lab 11/18/21 ?1208 11/18/21 ?1717 11/18/21 ?2042 11/19/21 ?9179 11/19/21 ?1148  ?GLUCAP 158* 184* 138* 184* 118*  ? ? ?Review  of Systems:   ?Please see the history of present illness. All other systems reviewed and are negative  ? ?Past Medical History:  ?She,  has a past medical history of Ankle fracture, Anxiety, Carotid artery occlusion, Closed fracture of left distal fibula (01/21/1274), Complication of anesthesia, Depression, Diabetes mellitus, GERD (gastroesophageal reflux disease), Headache, Hypertension, Pneumonia, Stroke [facility]) (March 2015), and Thyroid disease.  ? ?Surgical History:  ? ?Past Surgical History:  ?Procedure Laterality Date  ? ABDOMINAL HYSTERECTOMY    ? ANTERIOR FIXATION AND POSTERIOR MICRODISCECTOMY CERVICAL SPINE  1999  ? CAROTID ENDARTERECTOMY Left 11-04-13  ? cea  ? ENDARTERECTOMY Left 11/04/2013  ? IR ANGIO INTRA EXTRACRAN SEL COM CAROTID INNOMINATE BILAT MOD SED  11/16/2021  ? IR ANGIO VERTEBRAL SEL VERTEBRAL BILAT MOD SED  11/16/2021  ? ORIF ANKLE FRACTURE Left 09/16/2017  ? Procedure: OPEN REDUCTION INTERNAL FIXATION (ORIF) LEFT ANKLE FRACTURE;  Surgeon: Marchia Bond, MD;  Location: [place];  Service: Orthopedics;  Laterality: Left;  ?  ? ?Social History:  ? reports that she quit smoking about 8 years ago. Her smoking use included cigarettes. She has a 20.00 pack-year smoking history. She has never used smokeless tobacco. She reports that she does not currently use alcohol after a past usage of about 1.0 - 2.0 standard drink per week. She reports that she does not use drugs.   ? ?Family History:  ?Her family history includes Cancer in her father; Diabetes in her mother, sister, sister, and sister; Heart disease in her mother; Hypertension in her sister.  ? ?Allergies ?Allergies  ?Allergen Re

## 2021-11-19 NOTE — Progress Notes (Signed)
ANTICOAGULATION CONSULT NOTE - Initial Consult ? ?Pharmacy Consult for Heparin ?Indication:  Neuroradiology procedure ? ?Allergies  ?Allergen Reactions  ? Anesthetics, Ester Anaphylaxis  ? ? ?Patient Measurements: ?Height: '5\' 2"'$  (157.5 cm) ?Weight: 56.7 kg (125 lb) ?IBW/kg (Calculated) : 50.1 ? ? ?Vital Signs: ?Temp: 97.9 ?F (36.6 ?C) (04/06 1320) ?Temp Source: Oral (04/06 9628) ?BP: 134/56 (04/06 1720) ?Pulse Rate: 92 (04/06 1720) ? ?Labs: ?Recent Labs  ?  11/18/21 ?3662 11/18/21 ?1908 11/19/21 ?9476 11/19/21 ?1004  ?HGB 9.0* 9.5* 9.6* 7.1*  ?HCT 28.3* 31.0* 29.3* 21.0*  ?PLT 184 207 194  --   ?LABPROT  --  13.2  --   --   ?INR  --  1.0  --   --   ?CREATININE 1.63*  --  1.59* 1.30*  ? ? ?Estimated Creatinine Clearance: 30.9 mL/min (A) (by C-G formula based on SCr of 1.3 mg/dL (H)). ? ? ?Medical History: ?Past Medical History:  ?Diagnosis Date  ? Ankle fracture   ? Left  ? Anxiety   ? Carotid artery occlusion   ? Closed fracture of left distal fibula 09/16/2017  ? Complication of anesthesia   ? ALLERY TO ESTER BASE  ? Depression   ? early 66s  ? Diabetes mellitus   ? INSULIN DEPENDENT  ? GERD (gastroesophageal reflux disease)   ? Headache   ? years ago  ? Hypertension   ? Pneumonia   ? Stroke [facility]) March 2015  ?  left MCA infarct, slight weakness on left side  ? Thyroid disease   ? ?Assessment: ?71 y.o. female History of DM, GERD, L MCA CVA,  and history of left Carotid endarterectomy.   ?She was transferred to ED at [facility] from [facility] for dizziness, visual  and speech disturbances. Heparin was started Status post stent assisted angioplasty with distal protection of the left internal carotid artery proximal stenosis. ? ?Patient had bleeding at the groin site at 1645 - heparin held for 1 hour, then restarted. ? ? ?Goal of Therapy:  ?Heparin level 0.1-0.25 units/ml ?Monitor platelets by anticoagulation protocol: Yes ?  ?Plan:  ?Re-start heparin infusion at 500 units/hr ?Check anti-Xa level in 8 hours and  daily while on heparin ?Continue to monitor H&H and platelets ? ?Alanda Slim, PharmD, FCCM ?Clinical Pharmacist ?Please see AMION for all Pharmacists' Contact Phone Numbers ?11/19/2021, 5:53 PM  ? ? ? ?

## 2021-11-19 NOTE — Progress Notes (Signed)
Interventional Radiology Brief Note: ? ?PA informed of small amount of bloody drainage from R groin site.  Discussed with Dr. [name] who recommends holding pressure x15 min, hold heparin x1 hr, immobilize leg completely and remain lying flat.  Maintain blood pressure parameters.  ?RN aware.  ? ?Brynda Greathouse, MS RD PA-C ? ? ?

## 2021-11-19 NOTE — Anesthesia Procedure Notes (Signed)
Procedure Name: Intubation ?Date/Time: 11/19/2021 8:54 AM ?Performed by: Leonor Liv, CRNA ?Pre-anesthesia Checklist: Patient identified, Emergency Drugs available, Suction available and Patient being monitored ?Patient Re-evaluated:Patient Re-evaluated prior to induction ?Oxygen Delivery Method: Circle System Utilized ?Preoxygenation: Pre-oxygenation with 100% oxygen ?Induction Type: IV induction ?Ventilation: Mask ventilation without difficulty ?Laryngoscope Size: Mac and 3 ?Grade View: Grade I ?Tube type: Oral ?Tube size: 7.0 mm ?Number of attempts: 1 ?Airway Equipment and Method: Stylet and Oral airway ?Placement Confirmation: ETT inserted through vocal cords under direct vision, positive ETCO2 and breath sounds checked- equal and bilateral ?Secured at: 21 cm ?Tube secured with: Tape (left) ?Dental Injury: Teeth and Oropharynx as per pre-operative assessment  ? ? ? ? ?

## 2021-11-19 NOTE — Transfer of Care (Signed)
Immediate Anesthesia Transfer of Care Note ? ?Patient: Sonia Skinner ? ?Procedure(s) Performed: Left carotid angioplasty with possible stenting ? ?Patient Location: PACU ? ?Anesthesia Type:General ? ?Level of Consciousness: awake, alert  and oriented ? ?Airway & Oxygen Therapy: Patient Spontanous Breathing and Patient connected to nasal cannula oxygen ? ?Post-op Assessment: Report given to RN, Post -op Vital signs reviewed and stable, Patient moving all extremities and Patient able to stick tongue midline ? ?Post vital signs: Reviewed and stable ? ?Last Vitals:  ?Vitals Value Taken Time  ?BP 136/53 11/19/21 1150  ?Temp    ?Pulse 82 11/19/21 1155  ?Resp 14 11/19/21 1155  ?SpO2 98 % 11/19/21 1155  ?Vitals shown include unvalidated device data. ? ?Last Pain:  ?Vitals:  ? 11/19/21 0752  ?TempSrc: Oral  ?PainSc: 0-No pain  ?   ? ?Patients Stated Pain Goal: 2 (11/19/21 1610) ? ?Complications: No notable events documented. ?

## 2021-11-19 NOTE — Progress Notes (Signed)
Groin site dressing saturated upon assessment. Pressure held until 1810 when IR tech arrived. MD Deveshwar made aware.  ?

## 2021-11-19 NOTE — Progress Notes (Signed)
Pt transported via bed to IR, Call report given to Trixie Deis, CRNA. Dr Candiss Norse gave telephone orders to allow patient to travel off the unit without telemetry. ?

## 2021-11-19 NOTE — Anesthesia Procedure Notes (Signed)
Arterial Line Insertion ?Start/End4/01/2022 7:45 AM, 11/19/2021 8:15 AM ?Performed by: Leonor Liv, CRNA, CRNA ? Patient location: Pre-op. ?Preanesthetic checklist: patient identified, IV checked, site marked, risks and benefits discussed, surgical consent, monitors and equipment checked, pre-op evaluation, timeout performed and anesthesia consent ?Left, radial was placed ?Catheter size: 20 G ?Hand hygiene performed , maximum sterile barriers used  and Seldinger technique used ?Allen's test indicative of satisfactory collateral circulation ?Attempts: 2 ?Procedure performed without using ultrasound guided technique. ?Following insertion, dressing applied and Biopatch. ?Patient tolerated the procedure well with no immediate complications. ? ? ?

## 2021-11-19 NOTE — Progress Notes (Incomplete)
STROKE TEAM PROGRESS NOTE  ? ?INTERVAL HISTORY ? ? ? ? ?Vitals:  ? 11/19/21 1350 11/19/21 1420 11/19/21 1450 11/19/21 1520  ?BP: (!) 127/50 (!) 121/50 (!) 132/52 (!) 143/55  ?Pulse: 81 85 84 88  ?Resp: '16 20 16 15  '$ ?Temp:      ?TempSrc:      ?SpO2: 96% 98% 97% 99%  ?Weight:      ?Height:      ? ?CBC:  ?Recent Labs  ?Lab 11/13/21 ?1356 11/16/21 ?0100 11/18/21 ?1908 11/19/21 ?1191 11/19/21 ?1004  ?WBC 10.4   < > 10.8* 10.8*  --   ?NEUTROABS 6.5  --  5.9  --   --   ?HGB 11.3*   < > 9.5* 9.6* 7.1*  ?HCT 36.2   < > 31.0* 29.3* 21.0*  ?MCV 88.1   < > 88.1 86.4  --   ?PLT 240   < > 207 194  --   ? < > = values in this interval not displayed.  ? ?Basic Metabolic Panel:  ?Recent Labs  ?Lab 11/18/21 ?4782 11/19/21 ?9562 11/19/21 ?1004  ?NA 139 141 145  ?K 4.6 4.5 3.8  ?CL 112* 113* 114*  ?CO2 20* 21*  --   ?GLUCOSE 158* 166* 134*  ?BUN '21 19 16  '$ ?CREATININE 1.63* 1.59* 1.30*  ?CALCIUM 9.0 9.0  --   ?MG  --  1.2*  --   ? ?Lipid Panel:  ?Recent Labs  ?Lab 11/14/21 ?1434  ?CHOL 183  ?TRIG 198*  ?HDL 42  ?CHOLHDL 4.4  ?VLDL 40  ?LDLCALC 101*  ? ?HgbA1c:  ?Recent Labs  ?Lab 11/14/21 ?1434  ?HGBA1C 7.8*  ? ?Urine Drug Screen: No results for input(s): LABOPIA, COCAINSCRNUR, LABBENZ, AMPHETMU, THCU, LABBARB in the last 168 hours.  ?Alcohol Level No results for input(s): ETH in the last 168 hours. ? ?IMAGING past 24 hours ?No results found. ? ?PHYSICAL EXAM ?General: Frail elderly African-American lady   in no acute distress ?Respiratory:  Regular, unlabored respirations on room air ? ?NEURO:  ?Mental Status: AA&Ox3  ?Speech/Language: speech is without dysarthria or aphasia.  Naming, repetition, fluency, and comprehension intact. ? ?Cranial Nerves:  ?II: PERRL. Visual fields full.  ?III, IV, VI: EOMI. Eyelids elevate symmetrically.  ?V: Sensation is intact to light touch and symmetrical to face.  ?VII: Smile is symmetrical.   ?VIII: hearing intact to voice. ?IX, X: Phonation is normal.  ?[id number] shrug 5/5. ?XII: tongue is midline  without fasciculations. ?Motor: 5/5 strength to all muscle groups tested.  ?Tone: is normal and bulk is normal ?Sensation- Intact to light touch bilaterally.   ?Coordination: FTN intact bilaterally ?Gait- deferred ? ? ?ASSESSMENT/PLAN ?[name] is a 71 y.o. female with history of DM2, GERD, small L MCA stroke, HTN, headaches and hypothyroidism presenting with an episode of impaired speech where her tongue felt thick and she was unable to get the words out.  This resolved spontaneously.  She has had several of these episodes over the last month.  MRI of the brain shows no acute stroke, but MRA of the neck shows 70% stenosis of the left ICA with possible ulcerated plaque.  Will consult IR to discuss options for this. ? ?Left hemispheric TIA:  in setting of 70% left ICA stenosis with ulcerated plaque ?Status post stent assisted angioplasty today, 11/19/21, with distal protection of the left internal carotid artery proximal stenosis. Performed by Dr [name]. ?CT head No acute abnormality. Atrophy. Old left parietal cortex infarction ?MRI  Chronic left MCA infarct with small remote right thalamic lacunar infarcts, 1.3 cm left parotid lesion ?MRA  Negative for LVO or emergent findings, severe 70% stenosis of left ICA with atheromatous ulcer, 50-60% stenosis of bilateral carotid siphons, 2m outpouching from right ICA, 70% stenosis of left V1 segment ?2D Echo - EF > 75 % ?LDL 109 ?HgbA1c 8.0 ?VTE prophylaxis - SCDs, SQH ?   ?Diet  ? Diet NPO time specified Except for: Sips with Meds  ? ?aspirin 325 mg daily prior to admission, now on aspirin 81 mg daily and clopidogrel 75 mg daily.  ?Therapy recommendations:  no PT follow up ?Disposition:  pending ? ?Hypertension ?Home meds:  Edarbyclor 40-12.5 mg daily ?Stable ?Keep BP <180-105 ?Long-term BP goal normotensive ? ?Hyperlipidemia ?Home meds:  atorvastatin 80 mg daily, resumed in hospital ?LDL 109, goal < 70 ?Add zetia 10 mg daily  ?Continue statin at  discharge ? ?Diabetes type II Uncontrolled ?Home meds:  Farxiga 10 mg daily, insulin glargine 30-50 units daily ?HgbA1c 8.0, goal < 7.0 ?CBGs ?Recent Labs  ?  11/18/21 ?2042 11/19/21 ?0149704/06/23 ?1148  ?GLUCAP 138* 184* 118*  ?  ?SSI ? ?Other Stroke Risk Factors ?Advanced Age >/= 672 ?Former cigarette smoker  ?Hx stroke ? ?Other Active Problems ?Left parotid lesion seen on MRI ?Recommend outpatient ENT follow up ?Hypothyroidism ?Continue home synthroid ? ?Repeat labs pending for tomorrow 11/20/21 ? ?Hospital day # 4 ? ?Continue aspirin and Brilinta and elective left carotid angioplasty and stenting tomorrow by Dr. DEstanislado Pandy  Aggressive risk factor modification.     Greater than 50% time during this 25-minute visit was spent on counseling and coordination of care and discussion with patient and care team about a TIA and symptomatic aortic stenosis and answered questions. ? ?To contact Stroke Continuity provider, please refer to [email or web address] ?After hours, contact General Neurology  ?

## 2021-11-19 NOTE — Sedation Documentation (Signed)
6 Fr angioseal deployed right groin ?

## 2021-11-19 NOTE — Progress Notes (Signed)
PACU called IR to let us know the patient was oozing from the groin site; Patients name and DOB was confirmed, 15  minutes of pressure was helded with a quikclot. A tegaderm and a pressure dressing was applied and RN was notified.  ? ? ?Sonia Skinner RT (R) ?

## 2021-11-19 NOTE — Progress Notes (Signed)
eLink Physician-Brief Progress Note ?Patient Name: Sonia Skinner ?DOB: [birth year] ?MRN: [id number] ? ? ?Date of Service ? 11/19/2021  ?HPI/Events of Note ? Patient post-op left carotid stent placement. Requesting pain meds ?On Tylenol #3 at home  ?eICU Interventions ? Ordered oxycodone 5 mg q6 PRN  ? ? ? ?Intervention Category ?Minor Interventions: Routine modifications to care plan (e.g. PRN medications for pain, fever) ? ?Wray Goehring Rodman Pickle ?11/19/2021, 8:35 PM ?

## 2021-11-19 NOTE — Anesthesia Preprocedure Evaluation (Signed)
Anesthesia Evaluation  ?Patient identified by MRN, date of birth, ID band ?Patient awake ? ? ? ?Reviewed: ?Allergy & Precautions, NPO status , Patient's Chart, lab work & pertinent test results ? ?History of Anesthesia Complications ?Negative for: history of anesthetic complications ? ?Airway ?Mallampati: II ? ?TM Distance: >3 FB ?Neck ROM: Full ? ? ? Dental ? ?(+) Edentulous Upper, Dental Advisory Given ?  ?Pulmonary ?neg shortness of breath, neg sleep apnea, neg COPD, neg recent URI, former smoker,  ?  ?breath sounds clear to auscultation ? ? ? ? ? ? Cardiovascular ?hypertension, Pt. on medications ? ?Rhythm:Regular  ?1. Left ventricular ejection fraction, by estimation, is >75%. The left  ?ventricle has hyperdynamic function. The left ventricle has no regional  ?wall motion abnormalities. There is mild left ventricular hypertrophy of  ?the basal-septal segment. Left  ?ventricular diastolic parameters are consistent with Grade I diastolic  ?dysfunction (impaired relaxation). Elevated left atrial pressure.  ??2. Right ventricular systolic function is normal. The right ventricular  ?size is normal. Tricuspid regurgitation signal is inadequate for assessing  ?PA pressure.  ??3. The mitral valve is normal in structure. Trivial mitral valve  ?regurgitation. No evidence of mitral stenosis.  ??4. The aortic valve is normal in structure. Aortic valve regurgitation is  ?not visualized. No aortic stenosis is present.  ?  ?Neuro/Psych ? Headaches, PSYCHIATRIC DISORDERS Anxiety Depression TIACVA, No Residual Symptoms   ? GI/Hepatic ?GERD  Medicated,  ?Endo/Other  ?diabetes, Insulin DependentHypothyroidism  ? Renal/GU ?CRFRenal diseaseLab Results ?     Component                Value               Date                 ?     CREATININE               1.59 (H)            11/19/2021           ?  ? ?  ?Musculoskeletal ? ? Abdominal ?  ?Peds ? Hematology ? ?(+) Blood dyscrasia, anemia , Lab Results ?      Component                Value               Date                 ?     WBC                      10.8 (H)            11/19/2021           ?     HGB                      9.6 (L)             11/19/2021           ?     HCT                      29.3 (L)            11/19/2021           ?     MCV  86.4                11/19/2021           ?     PLT                      194                 11/19/2021           ?   ?Anesthesia Other Findings ? ? Reproductive/Obstetrics ? ?  ? ? ? ? ? ? ? ? ? ? ? ? ? ?  ?  ? ? ? ? ? ? ? ? ?Anesthesia Physical ?Anesthesia Plan ? ?ASA: 3 ? ?Anesthesia Plan: General  ? ?Post-op Pain Management: Minimal or no pain anticipated  ? ?Induction: Intravenous ? ?PONV Risk Score and Plan: 3 and Ondansetron and Dexamethasone ? ?Airway Management Planned: Oral ETT ? ?Additional Equipment: Arterial line ? ?Intra-op Plan:  ? ?Post-operative Plan: Extubation in OR ? ?Informed Consent: I have reviewed the patients History and Physical, chart, labs and discussed the procedure including the risks, benefits and alternatives for the proposed anesthesia with the patient or authorized representative who has indicated his/her understanding and acceptance.  ? ? ? ?Dental advisory given ? ?Plan Discussed with: CRNA ? ?Anesthesia Plan Comments:   ? ? ? ? ? ? ?Anesthesia Quick Evaluation ? ?

## 2021-11-19 NOTE — Sedation Documentation (Signed)
Patient transported to PACU with CRNA staff. PACU staff at the bedside to receive patient. Report given to [facility]. Groin site is clean, dry and intact. No hematoma noted, soft to palpation. +2 distal pulses intact. Quik-clot placed 4/6 1130. Remove in 24 hours. Patient is alert and oriented and following commands. Moving all extremities appropriately. ?

## 2021-11-19 NOTE — H&P (Signed)
? ? ?Referring Physician(s): ?Dr. Viona Gilmore ? ?Supervising Physician: Luanne Bras ? ?Patient Status:  [facility] - In-pt ? ?Chief Complaint: ? ?Left ICA stenosis ? ?Brief History: ? ?Sonia Skinner is a 71 y.o. female History of DM, GERD, L MCA CVA,  and history of left Carotid endarterectomy. ? ?She was transferred to ED at [facility] from [facility] for dizziness, visual  and speech disturbances.  ? ?On 11/16/21 IR Attending Dr. [name] performed a  4 vessel cerebral angiogram.  ? ?She was found to have approximately 75 to 80% stenosis of the left internal carotid artery. ? ?She is seen today for updated pre-op evaluation for cerebral angiography with angioplasty and stenting of the left ICA. ? ?Subjective: ? ?Doing well. No new symptoms. No complaints. ? ?Allergies: ?Anesthetics, ester ? ?Medications: ?Prior to Admission medications   ?Medication Sig Start Date End Date Taking? Authorizing Provider  ?Accu-Chek FastClix Lancets MISC Use to check blood sugar five times daily. 04/21/21  Yes [name], MD  ?acetaminophen (TYLENOL) 500 MG tablet Take 1,000-1,500 mg by mouth every 6 (six) hours as needed for mild pain.   Yes [provider]  ?Acetaminophen-Codeine (TYLENOL/CODEINE #3) 300-30 MG tablet Take 1 tablet by mouth every 6 (six) hours as needed for pain. 10/05/21  Yes Biagio Borg, MD  ?amitriptyline (ELAVIL) 100 MG tablet Take 1 tablet (100 mg total) by mouth at bedtime. ?Patient taking differently: Take 100 mg by mouth at bedtime as needed for sleep. 08/28/21  Yes Arfeen, Arlyce Harman, MD  ?amLODipine (NORVASC) 5 MG tablet Take 1 tablet (5 mg total) by mouth daily. 08/26/21  Yes Freada Bergeron, MD  ?aspirin EC 325 MG tablet Take 1 tablet (325 mg total) by mouth daily. 09/05/21 09/05/22 Yes Vashti Hey, MD  ?atorvastatin (LIPITOR) 40 MG tablet Take 1 tablet by mouth daily. ?Patient taking differently: Take 40 mg by mouth at bedtime. 09/19/21  Yes [name], MD  ?blood glucose meter  kit and supplies KIT Use to test blood sugars 04/27/19  Yes [name], MD  ?buPROPion (WELLBUTRIN XL) 300 MG 24 hr tablet Take 1 tablet (300 mg total) by mouth daily. 08/28/21  Yes Arfeen, Arlyce Harman, MD  ?Cholecalciferol (VITAMIN D3 PO) Take 1 tablet by mouth daily. Unsure of dose   Yes [provider]  ?dexlansoprazole (DEXILANT) 60 MG capsule Take 1 capsule (60 mg total) by mouth daily. 08/20/21  Yes [name], MD  ?EDARBYCLOR 40-12.5 MG TABS Take 1 tablet by mouth daily. 09/19/21  Yes [name], MD  ?Wilder Glade 10 MG TABS tablet Take 1 tablet by mouth daily before breakfast. 09/19/21  Yes [name], MD  ?gabapentin (NEURONTIN) 100 MG capsule Take 1 capsule (100 mg total) by mouth 3 (three) times daily. ?Patient taking differently: Take 100-200 mg by mouth See admin instructions. 200 mg int the morning ?100 mg at bedtime 10/05/21  Yes Biagio Borg, MD  ?glucose blood (ACCU-CHEK GUIDE) test strip Use to check blood sugars five times daily. 04/21/21  Yes [name], MD  ?Insulin Glargine (BASAGLAR KWIKPEN [place]) Inject 30-50 Units into the skin daily. 30-35 units if below 150 ?50 units if over 150   Yes [provider]  ?Insulin Pen Needle 32G X 4 MM MISC Use to administer insulin four times a day 04/02/20  Yes [name], MD  ?levothyroxine (SYNTHROID) 88 MCG tablet Take 1 tablet (88 mcg total) by mouth  daily. 08/27/21  Yes [name], MD  ?loratadine (CLARITIN) 10 MG tablet Take 1 tablet (10 mg total) by mouth daily. 04/21/21  Yes [name], MD  ?Semaglutide, 2 MG/DOSE, 8 MG/3ML SOPN Inject 2 mg as directed once a week. ?Patient taking differently: Inject 2 mg as directed every 14 (fourteen) days. 08/28/21  Yes [name], MD  ?Continuous Blood Gluc Receiver (DEXCOM G6 RECEIVER) DEVI 1 Act by Does not apply route daily. 08/27/21   [name], MD  ?Continuous Blood Gluc Sensor (DEXCOM G6 SENSOR) MISC 1 Act by Does not apply route daily. 08/27/21   [name], MD   ?Continuous Blood Gluc Transmit (DEXCOM G6 TRANSMITTER) MISC 1 Act by Does not apply route daily. 08/27/21   [name], MD  ?Insulin NPH, Human,, Isophane, (NOVOLIN N FLEXPEN) 100 UNIT/ML Kiwkpen Inject 35 Units into the skin every morning. And pen needles 1/day ?Patient not taking: Reported on 11/14/2021 11/02/21   Renato Shin, MD  ? ? ? ?Vital Signs: ?BP (!) 140/54 (BP Location: Left Arm)   Pulse 77   Temp 98 ?F (36.7 ?C)   Resp 12   Ht _0  (1.575 m)   Wt 125 lb (56.7 kg)   SpO2 95%   BMI 22.86 kg/m?  ? ?Physical Exam ?Vitals reviewed.  ?Constitutional:   ?   Appearance: Normal appearance.  ?HENT:  ?   Head: Normocephalic and atraumatic.  ?Eyes:  ?   Extraocular Movements: Extraocular movements intact.  ?Cardiovascular:  ?   Rate and Rhythm: Normal rate and regular rhythm.  ?Pulmonary:  ?   Effort: Pulmonary effort is normal. No respiratory distress.  ?   Breath sounds: Normal breath sounds.  ?Abdominal:  ?   Palpations: Abdomen is soft.  ?Musculoskeletal:     ?   General: Normal range of motion.  ?   Cervical back: Normal range of motion.  ?Skin: ?   General: Skin is warm and dry.  ?Neurological:  ?   General: No focal deficit present.  ?   Mental Status: She is alert and oriented to person, place, and time.  ?Psychiatric:     ?   Mood and Affect: Mood normal.     ?   Behavior: Behavior normal.     ?   Thought Content: Thought content normal.     ?   Judgment: Judgment normal.  ? ? ?Imaging: ?EEG adult ? ?Result Date: 11/15/2021 ?Lora Havens, MD     11/15/2021  9:03 AM Patient Name: Sonia Skinner MRN: [id number] Epilepsy Attending: Lora Havens Referring Physician/Provider: Donnetta Simpers, MD Date: 11/14/2021 Duration: 23.42 mins Patient history: 71 y.o. female with PMH significant for DM2, GERD, prior small L MCA stroke, HTN, headaches, hypothyroidism who presents with multiple complaints. Most significantly, reports episode of entire vision going white while she was driving on Wednesday but  was brief and endorses some lightheadedness during that too. 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 This went on for about 30-40 mins and then resolved. Reports she also had an episode later where she felt off. EEG to evaluate for seizure Level of alertness: Awake,asleep AEDs during EEG study: None Technical aspects: This EEG study was done with scalp electrodes positioned according to the 10-20 International system of electrode placement. Electrical activity  was acquired at a sampling rate of _0  and reviewed with a high frequency filter of _1  and a low frequency filter of _2 . EEG data were recorded continuously and digitally stored. Description: The posterior dominant rhythm consists of 8-_3  activity of moderate voltage (25-35 uV) seen predominantly in posterior head regions, symmetric and reactive to eye opening and eye closing. Sleep was characterized by vertex waves, sleep spindles (12 to 14 Hz), maximal frontocentral region. Hyperventilation and photic stimulation were not performed.   IMPRESSION: This study is within normal limits. No seizures or epileptiform discharges were seen throughout the recording. Priyanka Barbra Sarks  ? ?IR ANGIO INTRA EXTRACRAN SEL COM CAROTID INNOMINATE BILAT MOD SED ? ?Result Date: 11/18/2021 ?CLINICAL DATA:  History of TIAs. Severe left internal carotid proximal stenosis by ultrasound and MRA of the head and neck. EXAM: BILATERAL COMMON CAROTID AND INNOMINATE ANGIOGRAPHY COMPARISON:  MRI and MRA of the brain November 13, 2021. MEDICATIONS: Heparin 1000 units IV. No antibiotic was administered within 1 hour of the procedure. ANESTHESIA/SEDATION: Versed 1.5 mg IV; Fentanyl 37.5 mcg IV; hydralazine 5 mg for hypertension. Moderate Sedation Time:  25 minutes The patient was continuously monitored during the procedure by the  interventional radiology nurse under my direct supervision. CONTRAST:  Omnipaque 300 approximately 65 mL FLUOROSCOPY TIME:  Fluoroscopy Time: 7 minutes 30 seconds (671.3 mGy). COMPLICATIONS: None immediate. TECHNIQUE: Info

## 2021-11-19 NOTE — Procedures (Signed)
INR. ?Left common carotid arteriogram.  Right CFA approach. ?Status post stent assisted angioplasty with distal protection of the left internal carotid artery proximal stenosis.Marland Kitchen ? ?Postop CT of the brain demonstrates no intracranial hemorrhage. ?Extubated. ?Following commands appropriately. ?Denies any headaches, nausea or vomiting.Marland Kitchen ?Pupils are 2 to 3 mm bilaterally sluggish. ?No facial asymmetry.  Tongue midline. ?Moves all fours equally.  Right groin soft.  Distal pulses all dopplerable. ? ?Arlean Hopping MD. ?

## 2021-11-19 NOTE — Plan of Care (Signed)
Patient went to interventional radiology early in the morning around 6:50 AM for  Left common carotid arteriogram.  Right CFA approach.  She is now status post stent assisted angioplasty with distal protection of the left internal carotid artery proximal stenosis.  She finished the procedure at 11:45 AM on 11/19/2021. ? ?Discussed with Dr. [name], she will be monitored for 24 hours in neuro ICU.  Once she comes off ICU hospitalist team will commence to round.  I will inform the ICU team.    ?

## 2021-11-19 NOTE — Sedation Documentation (Signed)
ACT 245. Dr. [name] notified  ? ?

## 2021-11-19 NOTE — Progress Notes (Signed)
PT Cancellation Note ? ?Patient Details ?Name: Sonia Skinner ?MRN: [id number] ?DOB: [birth year] ? ? ?Cancelled Treatment:    Reason Eval/Treat Not Completed: Patient at procedure or test/unavailable this morning, off unit for cerebral angioplasty with stenting of L ICA. Will continue to follow and evaluate as appropriate.  ? ?West Carbo, PT, DPT  ? ?Acute Rehabilitation Department ?Pager #: [phone] ? ? ?Sandra Cockayne ?11/19/2021, 10:19 AM ?

## 2021-11-20 ENCOUNTER — Encounter (HOSPITAL_COMMUNITY): Payer: Self-pay | Admitting: Interventional Radiology

## 2021-11-20 ENCOUNTER — Other Ambulatory Visit (HOSPITAL_COMMUNITY): Payer: Self-pay

## 2021-11-20 ENCOUNTER — Other Ambulatory Visit: Payer: Self-pay | Admitting: *Deleted

## 2021-11-20 LAB — CBC
HCT: 26.2 % — ABNORMAL LOW (ref 36.0–46.0)
Hemoglobin: 8.2 g/dL — ABNORMAL LOW (ref 12.0–15.0)
MCH: 28 pg (ref 26.0–34.0)
MCHC: 31.3 g/dL (ref 30.0–36.0)
MCV: 89.4 fL (ref 80.0–100.0)
Platelets: 191 10*3/uL (ref 150–400)
RBC: 2.93 MIL/uL — ABNORMAL LOW (ref 3.87–5.11)
RDW: 13.7 % (ref 11.5–15.5)
WBC: 16.6 10*3/uL — ABNORMAL HIGH (ref 4.0–10.5)
nRBC: 0 % (ref 0.0–0.2)

## 2021-11-20 LAB — COMPREHENSIVE METABOLIC PANEL
ALT: 18 U/L (ref 0–44)
AST: 13 U/L — ABNORMAL LOW (ref 15–41)
Albumin: 2.4 g/dL — ABNORMAL LOW (ref 3.5–5.0)
Alkaline Phosphatase: 77 U/L (ref 38–126)
Anion gap: 4 — ABNORMAL LOW (ref 5–15)
BUN: 20 mg/dL (ref 8–23)
CO2: 19 mmol/L — ABNORMAL LOW (ref 22–32)
Calcium: 8 mg/dL — ABNORMAL LOW (ref 8.9–10.3)
Chloride: 118 mmol/L — ABNORMAL HIGH (ref 98–111)
Creatinine, Ser: 1.49 mg/dL — ABNORMAL HIGH (ref 0.44–1.00)
GFR, Estimated: 37 mL/min — ABNORMAL LOW (ref >60)
Glucose, Bld: 171 mg/dL — ABNORMAL HIGH (ref 70–99)
Potassium: 4.2 mmol/L (ref 3.5–5.1)
Sodium: 141 mmol/L (ref 135–145)
Total Bilirubin: 0.3 mg/dL (ref 0.3–1.2)
Total Protein: 4.8 g/dL — ABNORMAL LOW (ref 6.5–8.1)

## 2021-11-20 LAB — RETICULOCYTES
Immature Retic Fract: 33.3 % — ABNORMAL HIGH (ref 2.3–15.9)
RBC.: 2.54 MIL/uL — ABNORMAL LOW (ref 3.87–5.11)
Retic Count, Absolute: 68.3 10*3/uL (ref 19.0–186.0)
Retic Ct Pct: 2.7 % (ref 0.4–3.1)

## 2021-11-20 LAB — CBC WITH DIFFERENTIAL/PLATELET
Abs Immature Granulocytes: 0.09 10*3/uL — ABNORMAL HIGH (ref 0.00–0.07)
Basophils Absolute: 0 10*3/uL (ref 0.0–0.1)
Basophils Relative: 0 %
Eosinophils Absolute: 0 10*3/uL (ref 0.0–0.5)
Eosinophils Relative: 0 %
HCT: 22.9 % — ABNORMAL LOW (ref 36.0–46.0)
Hemoglobin: 7 g/dL — ABNORMAL LOW (ref 12.0–15.0)
Immature Granulocytes: 1 %
Lymphocytes Relative: 21 %
Lymphs Abs: 3.1 10*3/uL (ref 0.7–4.0)
MCH: 27.1 pg (ref 26.0–34.0)
MCHC: 30.6 g/dL (ref 30.0–36.0)
MCV: 88.8 fL (ref 80.0–100.0)
Monocytes Absolute: 1.1 10*3/uL — ABNORMAL HIGH (ref 0.1–1.0)
Monocytes Relative: 7 %
Neutro Abs: 10.5 10*3/uL — ABNORMAL HIGH (ref 1.7–7.7)
Neutrophils Relative %: 71 %
Platelets: 164 10*3/uL (ref 150–400)
RBC: 2.58 MIL/uL — ABNORMAL LOW (ref 3.87–5.11)
RDW: 13.6 % (ref 11.5–15.5)
WBC: 14.8 10*3/uL — ABNORMAL HIGH (ref 4.0–10.5)
nRBC: 0 % (ref 0.0–0.2)

## 2021-11-20 LAB — IRON AND TIBC
Iron: 51 ug/dL (ref 28–170)
Saturation Ratios: 20 % (ref 10.4–31.8)
TIBC: 249 ug/dL — ABNORMAL LOW (ref 250–450)
UIBC: 198 ug/dL

## 2021-11-20 LAB — GLUCOSE, CAPILLARY
Glucose-Capillary: 216 mg/dL — ABNORMAL HIGH (ref 70–99)
Glucose-Capillary: 193 mg/dL — ABNORMAL HIGH (ref 70–99)

## 2021-11-20 LAB — HEPARIN LEVEL (UNFRACTIONATED): Heparin Unfractionated: <0.1 IU/mL — ABNORMAL LOW (ref 0.30–0.70)

## 2021-11-20 LAB — BRAIN NATRIURETIC PEPTIDE: B Natriuretic Peptide: 192.2 pg/mL — ABNORMAL HIGH (ref 0.0–100.0)

## 2021-11-20 LAB — FERRITIN: Ferritin: 34 ng/mL (ref 11–307)

## 2021-11-20 LAB — MAGNESIUM: Magnesium: 1.3 mg/dL — ABNORMAL LOW (ref 1.7–2.4)

## 2021-11-20 MED ORDER — ATORVASTATIN CALCIUM 80 MG PO TABS: 40.0000 mg | ORAL_TABLET | Freq: Every day | ORAL | 1 refills | Status: DC

## 2021-11-20 MED ORDER — HEPARIN (PORCINE) 25000 UT/250ML-% IV SOLN: 600.0000 [IU]/h | INTRAVENOUS | Status: DC

## 2021-11-20 MED ORDER — MAGNESIUM SULFATE 4 GM/100ML IV SOLN
4.0000 g | Freq: Once | INTRAVENOUS | Status: AC
  Administered 2021-11-20: 4 g via INTRAVENOUS
  Filled 2021-11-20: qty 100

## 2021-11-20 MED ORDER — EZETIMIBE 10 MG PO TABS: 10.0000 mg | ORAL_TABLET | Freq: Every day | ORAL | 1 refills | Status: DC

## 2021-11-20 MED ORDER — AMLODIPINE BESYLATE 5 MG PO TABS
5.0000 mg | ORAL_TABLET | Freq: Every day | ORAL | Status: DC
  Administered 2021-11-20: 5 mg via ORAL
  Filled 2021-11-20: qty 1

## 2021-11-20 MED ORDER — TICAGRELOR 90 MG PO TABS: 90.0000 mg | ORAL_TABLET | Freq: Two times a day (BID) | ORAL | 1 refills | Status: DC

## 2021-11-20 MED ORDER — POLYETHYLENE GLYCOL 3350 17 G PO PACK: 17.0000 g | PACK | Freq: Every day | ORAL | Status: DC | PRN

## 2021-11-20 MED ORDER — MAGNESIUM SULFATE 2 GM/50ML IV SOLN
2.0000 g | Freq: Once | INTRAVENOUS | Status: AC
  Administered 2021-11-20: 2 g via INTRAVENOUS
  Filled 2021-11-20: qty 50

## 2021-11-20 MED ORDER — DOCUSATE SODIUM 100 MG PO CAPS
100.0000 mg | ORAL_CAPSULE | Freq: Every day | ORAL | Status: DC
  Administered 2021-11-20: 100 mg via ORAL
  Filled 2021-11-20: qty 1

## 2021-11-20 MED ORDER — ASPIRIN 81 MG PO CHEW
81.0000 mg | CHEWABLE_TABLET | Freq: Every day | ORAL | 1 refills | Status: AC
Start: 2021-11-21 — End: ?

## 2021-11-20 NOTE — Progress Notes (Signed)
ANTICOAGULATION CONSULT NOTE  ?Pharmacy Consult for Heparin ?Indication:  Neuroradiology procedure ?Brief A/P: Heparin level subtherapeutic Increase Heparin rate ? ?Allergies  ?Allergen Reactions  ? Anesthetics, Ester Anaphylaxis  ? ? ?Patient Measurements: ?Height: '5\' 2"'$  (157.5 cm) ?Weight: 56.7 kg (125 lb) ?IBW/kg (Calculated) : 50.1 ? ? ?Vital Signs: ?Temp: 98.8 ?F (37.1 ?C) (04/07 0000) ?Temp Source: Oral (04/07 0000) ?BP: 129/53 (04/07 0300) ?Pulse Rate: 90 (04/07 0300) ? ?Labs: ?Recent Labs  ?  11/18/21 ?5465 11/18/21 ?1908 11/19/21 ?6812 11/19/21 ?1004 11/20/21 ?0125  ?HGB 9.0* 9.5* 9.6* 7.1*  --   ?HCT 28.3* 31.0* 29.3* 21.0*  --   ?PLT 184 207 194  --   --   ?LABPROT  --  13.2  --   --   --   ?INR  --  1.0  --   --   --   ?HEPARINUNFRC  --   --   --   --  <0.10*  ?CREATININE 1.63*  --  1.59* 1.30*  --   ? ? ? ?Estimated Creatinine Clearance: 30.9 mL/min (A) (by C-G formula based on SCr of 1.3 mg/dL (H)). ? ? ?Assessment: ?71 y.o. female s/p LICA stent assisted angioplastyfor heparin ? ?Goal of Therapy:  ?Heparin level 0.1-0.25 units/ml ?Monitor platelets by anticoagulation protocol: Yes ?  ?Plan:  ?Increase Heparin 600 units/hr ? ?Phillis Knack, PharmD, BCPS  ?11/20/2021, 3:12 AM  ? ? ? ?

## 2021-11-20 NOTE — Patient Outreach (Signed)
[facility]) Care Management ? ?11/20/2021 ? ?Sonia Skinner ?06/24/1951 ?[id number] ? ?Patient was admitted to the hospital on 11/13/21. Nurse Health Coach will perform case closure and transfer patient to the Embedded CCM. [facility] Liaison Coordinator informed this nurse of patient's admission.  ?  ?Plan: ?RN Health Coach Discipline Closure ?Discipline Closure letter sent to PCP  ? ?[name] RN, BSN ?Nurse Health Coach ?[place] ?[phone] ?[email or web address] ? ?

## 2021-11-20 NOTE — Anesthesia Postprocedure Evaluation (Signed)
Anesthesia Post Note ? ?Patient: Sonia Skinner ? ?Procedure(s) Performed: Left carotid angioplasty with possible stenting ? ?  ? ?Patient location during evaluation: PACU ?Anesthesia Type: General ?Level of consciousness: awake and patient cooperative ?Pain management: pain level controlled ?Vital Signs Assessment: post-procedure vital signs reviewed and stable ?Respiratory status: spontaneous breathing, nonlabored ventilation, respiratory function stable and patient connected to nasal cannula oxygen ?Cardiovascular status: blood pressure returned to baseline and stable ?Postop Assessment: no apparent nausea or vomiting ?Anesthetic complications: no ? ? ?No notable events documented. ? ?Last Vitals:  ?Vitals:  ? 11/20/21 1100 11/20/21 1200  ?BP: (!) 141/61 (!) 144/44  ?Pulse:  85  ?Resp: 10 17  ?Temp:  37.1 ?C  ?SpO2:  98%  ?  ?Last Pain:  ?Vitals:  ? 11/20/21 1200  ?TempSrc: Oral  ?PainSc: 0-No pain  ? ? ?  ?  ?  ?  ?  ?  ? ?Debbrah Sampedro ? ? ? ? ?

## 2021-11-20 NOTE — Discharge Summary (Signed)
Physician Discharge Summary  ?Patient ID: ?Sonia Skinner ?MRN: [id number] ?DOB/AGE: May 30, 1951 71 y.o. ? ?Admit date: 11/13/2021 ?Discharge date: 11/20/2021 ? ?Admission Diagnoses: ?TIA ?Left ICA Stenosis s/p Stent placement.  ? ?Discharge Diagnoses:  ?Principal Problem: ?  TIA (transient ischemic attack) ?Active Problems: ?  Essential hypertension, benign ?  Type II diabetes mellitus with manifestations ([place]) ?  Hypothyroidism ?  Hyperlipidemia with target LDL less than 70 ?  Major depressive disorder, recurrent episode ([place]) ?  GERD (gastroesophageal reflux disease) ?  Stage 3b chronic kidney disease ([place]) ?  Mass of left parotid gland ?  Left carotid stenosis ?  Internal carotid artery stenosis, left ? ? ?Discharged Condition: good ? ?Hospital Course: [name] is a 71 year old female with past medical history significant for type 2 diabetes mellitus, essential hypertension, ICA stenosis s/p left carotid endarterectomy 2015, CKD stage IIIb, GERD, history of CVA who presented to [facility] ED on 3/31 with a visual changes described as "vision white out", feeling of tongue thickening, gait disturbance and speech impairment.  Visual changes onset roughly 3 days prior.  Speech impairment reported lasting about 30 minutes.  Additionally with fatigue and dizziness which prompted her to seek care in the ED. ? ?In the ED, CT head without contrast with no acute intracranial findings, noted old infarct left parietal cortex unchanged. Neurology was consulted.  Patient was transferred to [facility] for further stroke versus TIA work-up.  ? ?On admission, MR brain no acute infarct, chronic left MCA infarct.  MRA head/neck 70% stenosis proximal cervical left ICA. She underwent  EEG with no seizures or epileptiform discharges.  PT/OT evaluated with no follow-up recommended. She underwent cerebral angiograph with left ICA stent placement on 11/19/21 which was well tolerated. She was resumed on her home  antihypertensive at discharge. Her ASA 325 was decreased to 81 mg daily because she was discharged on brillinta 90 mg BID. She will be contacted by Dr. Arlean Hopping office for outpatient follow up and I advised her to call PCP for follow up within one week.  ?  ? ?Consults:  IR, Neurology ? ? ?Treatments: Neuroradiology  stent placement in left ICA on 11/19/21.  ? ?Discharge Exam: ?Blood pressure (!) 132/46, pulse 84, temperature 98.5 ?F (36.9 ?C), temperature source Oral, resp. rate 16, height $RemoveBe'5\' 2"'EHmFodAlX$  (1.575 m), weight 56.7 kg, SpO2 98 %. ?General appearance: alert, cooperative, and no distress ?Resp: clear to auscultation bilaterally and no wheezes or crackles, stable on room air ?Cardio: regular rate and rhythm, S1, S2 normal, no murmur, click, rub or gallop ?Neurologic: Grossly normal. Strength bilateral and symmetric in upper and lower extremities ?Ext: no edema ? ? ? ?Allergies as of 11/20/2021   ? ?   Reactions  ? Anesthetics, Ester Anaphylaxis  ? ?  ? ?  ?Medication List  ?  ? ?STOP taking these medications   ? ?aspirin EC 325 MG tablet ?Replaced by: aspirin 81 MG chewable tablet ?  ? ?  ? ?TAKE these medications   ? ?Accu-Chek FastClix Lancets Misc ?Use to check blood sugar five times daily. ?  ?Accu-Chek Guide test strip ?Generic drug: glucose blood ?Use to check blood sugars five times daily. ?  ?acetaminophen 500 MG tablet ?Commonly known as: TYLENOL ?Take 1,000-1,500 mg by mouth every 6 (six) hours as needed for mild pain. ?  ?Acetaminophen-Codeine 300-30 MG tablet ?Commonly known as: TYLENOL/CODEINE #3 ?Take 1 tablet by mouth every 6 (six) hours as needed for pain. ?  ?  amitriptyline 100 MG tablet ?Commonly known as: ELAVIL ?Take 1 tablet (100 mg total) by mouth at bedtime. ?What changed:  ?when to take this ?reasons to take this ?  ?amLODipine 5 MG tablet ?Commonly known as: NORVASC ?Take 1 tablet (5 mg total) by mouth daily. ?  ?aspirin 81 MG chewable tablet ?Chew 1 tablet (81 mg total) by mouth  daily. ?Start taking on: November 21, 2021 ?Replaces: aspirin EC 325 MG tablet ?  ?atorvastatin 80 MG tablet ?Commonly known as: LIPITOR ?Take 0.5 tablets (40 mg total) by mouth daily. ?What changed: medication strength ?  ?BASAGLAR KWIKPEN [place] ?Inject 30-50 Units into the skin daily. 30-35 units if below 150 ?50 units if over 150 ?  ?blood glucose meter kit and supplies Kit ?Use to test blood sugars ?  ?buPROPion 300 MG 24 hr tablet ?Commonly known as: WELLBUTRIN XL ?Take 1 tablet (300 mg total) by mouth daily. ?  ?Dexcom G6 Receiver Kerrin Mo ?1 Act by Does not apply route daily. ?  ?Dexcom G6 Sensor Misc ?1 Act by Does not apply route daily. ?  ?Dexcom G6 Transmitter Misc ?1 Act by Does not apply route daily. ?  ?dexlansoprazole 60 MG capsule ?Commonly known as: DEXILANT ?Take 1 capsule (60 mg total) by mouth daily. ?  ?Edarbyclor 40-12.5 MG Tabs ?Generic drug: Azilsartan-Chlorthalidone ?Take 1 tablet by mouth daily. ?  ?ezetimibe 10 MG tablet ?Commonly known as: ZETIA ?Take 1 tablet (10 mg total) by mouth daily. ?Start taking on: November 21, 2021 ?  ?Farxiga 10 MG Tabs tablet ?Generic drug: dapagliflozin propanediol ?Take 1 tablet by mouth daily before breakfast. ?  ?gabapentin 100 MG capsule ?Commonly known as: NEURONTIN ?Take 1 capsule (100 mg total) by mouth 3 (three) times daily. ?What changed:  ?how much to take ?when to take this ?additional instructions ?  ?Insulin Pen Needle 32G X 4 MM Misc ?Use to administer insulin four times a day ?  ?levothyroxine 88 MCG tablet ?Commonly known as: SYNTHROID ?Take 1 tablet (88 mcg total) by mouth daily. ?  ?loratadine 10 MG tablet ?Commonly known as: CLARITIN ?Take 1 tablet (10 mg total) by mouth daily. ?  ?NovoLIN N FlexPen 100 UNIT/ML Kiwkpen ?Generic drug: Insulin NPH (Human) (Isophane) ?Inject 35 Units into the skin every morning. And pen needles 1/day ?  ?Semaglutide (2 MG/DOSE) 8 MG/3ML Sopn ?Inject 2 mg as directed once a week. ?What changed: when to take this ?   ?ticagrelor 90 MG Tabs tablet ?Commonly known as: BRILINTA ?Take 1 tablet (90 mg total) by mouth 2 (two) times daily. ?  ?VITAMIN D3 PO ?Take 1 tablet by mouth daily. Unsure of dose ?  ? ?  ? ? Follow-up Information   ? ? [name], MD. Schedule an appointment as soon as possible for a visit in 1 week(s).   ?Specialty: Internal Medicine ?Contact information: ?[place] [address] ?[phone] ? ? ?  ?  ? ? Sonia Baptist, MD. Schedule an appointment as soon as possible for a visit in 1 week(s).   ?Specialty: Otolaryngology ?Why: parotid mass ?Contact information: ?[address] ?[address] ?[place] [address] ?[phone] ? ? ?  ?  ? ? [facility]. Schedule an appointment as soon as possible for a visit in 1 week(s).   ?Contact information: ?[place] [address] ?[phone] ? ?  ?  ? ? Luanne Bras, MD Follow up in 2 week(s).   ?  Specialties: Interventional Radiology, Radiology ?Why: follow up with Dr [name] 2 weeks--- pt will hear from scheduler for time and date-- call [phone] if concerns ?Contact information: ?[address] ?[place],   [address] ?[place] [address] ?[phone] ? ? ?  ?  ? ?  ?  ? ?  ? ? ?Signed: ?Spero Geralds ?11/20/2021, 11:21 AM ? ? ?

## 2021-11-20 NOTE — Progress Notes (Signed)
eLink Physician-Brief Progress Note ?Patient Name: Sonia Skinner ?DOB: [birth year] ?MRN: [id number] ? ? ?Date of Service ? 11/20/2021  ?HPI/Events of Note ? Post-op Hg 7 ?Hemodynamically stable  ?eICU Interventions ? Trend  ? ? ? ?Intervention Category ?Intermediate Interventions: Electrolyte abnormality - evaluation and management ? ?Whitney Hillegass Rodman Pickle ?11/20/2021, 4:32 AM ?

## 2021-11-20 NOTE — Progress Notes (Signed)
[facility] ADULT ICU REPLACEMENT PROTOCOL ? ? ?The patient does apply for the [facility] Adult ICU Electrolyte Replacment Protocol based on the criteria listed below:  ? ?1.Exclusion criteria: TCTS patients, ECMO patients, and Dialysis patients ?2. Is GFR >/= 30 ml/min? Yes.    ?Patient's GFR today is 37 ?3. Is SCr </= 2? Yes.   ?Patient's SCr is 1.49 mg/dL ?4. Did SCr increase >/= 0.5 in 24 hours? No. ?5.Pt's weight >40kg  Yes.   ?6. Abnormal electrolyte(s): Mg 1.3  ?7. Electrolytes replaced per protocol ?8.  Call MD STAT for K+ </= 2.5, Phos </= 1, or Mag </= 1 ?Physician:  Loanne Drilling ? ?Sonia Skinner 11/20/2021 4:16 AM  ?

## 2021-11-20 NOTE — Consult Note (Signed)
? ?  [facility] CM Inpatient Consult ? ? ?11/20/2021 ? ?HETTIE ROSELLI ?May 11, 1951 ?[id number] ? ?[facility] [[facility]] Patient: [employer] ? ?Primary Care Provider: [name], MD, [place] is an Embedded provider with a Chronic Care Management program and team. ?  ? ?Patient is currently active with [facility] for chronic disease management services.  Patient has been engaged by a [place].  Our community based plan of care has focused on disease management and community resource support.   ? ?Plan: Follow for transitioning and to refer patient to the CCM program for post hospital transitional needs for higher level needs for CCM. ? ? Of note, [facility] Care Management services does not replace or interfere with any services that are needed or arranged by inpatient [facility] care management team.   ? ?For additional questions or referrals please contact: ? ?Natividad Brood, RN BSN CCM ?[facility] Liaison ? [phone] business mobile phone ?Toll free office [phone]  ?Fax number: [phone] ?[email or web address] ? ? ? ? ?

## 2021-11-20 NOTE — Progress Notes (Signed)
? ? ? ?Supervising Physician: Luanne Bras ? ?Patient Status:  [facility] - In-pt ? ?Chief Complaint: ? ?Left internal carotid artery stenosisStatus post stent assisted angioplasty with distal protection of the left internal carotid artery proximal stenosis with R CFA approach with Dr. Arlean Hopping 11/19/21 ? ?Subjective: ? ?Doing well ?Up in bed' ?Eating well; slept well ?Slight headache last pm- none now ?Denies N/V ?Denies speech or vision changes ?Denies numbness;tingling ?Dr [name] has seen and examined pt ?He removed C collar this am ? ? ? ?Allergies: ?Anesthetics, ester ? ?Medications: ?Prior to Admission medications   ?Medication Sig Start Date End Date Taking? Authorizing Provider  ?Accu-Chek FastClix Lancets MISC Use to check blood sugar five times daily. 04/21/21  Yes [name], MD  ?acetaminophen (TYLENOL) 500 MG tablet Take 1,000-1,500 mg by mouth every 6 (six) hours as needed for mild pain.   Yes [provider]  ?Acetaminophen-Codeine (TYLENOL/CODEINE #3) 300-30 MG tablet Take 1 tablet by mouth every 6 (six) hours as needed for pain. 10/05/21  Yes Biagio Borg, MD  ?amitriptyline (ELAVIL) 100 MG tablet Take 1 tablet (100 mg total) by mouth at bedtime. ?Patient taking differently: Take 100 mg by mouth at bedtime as needed for sleep. 08/28/21  Yes Arfeen, Arlyce Harman, MD  ?amLODipine (NORVASC) 5 MG tablet Take 1 tablet (5 mg total) by mouth daily. 08/26/21  Yes Freada Bergeron, MD  ?aspirin EC 325 MG tablet Take 1 tablet (325 mg total) by mouth daily. 09/05/21 09/05/22 Yes Vashti Hey, MD  ?atorvastatin (LIPITOR) 40 MG tablet Take 1 tablet by mouth daily. ?Patient taking differently: Take 40 mg by mouth at bedtime. 09/19/21  Yes [name], MD  ?blood glucose meter kit and supplies KIT Use to test blood sugars 04/27/19  Yes [name], MD  ?buPROPion (WELLBUTRIN XL) 300 MG 24 hr tablet Take 1 tablet (300 mg total) by mouth daily. 08/28/21  Yes Arfeen, Arlyce Harman, MD   ?Cholecalciferol (VITAMIN D3 PO) Take 1 tablet by mouth daily. Unsure of dose   Yes [provider]  ?dexlansoprazole (DEXILANT) 60 MG capsule Take 1 capsule (60 mg total) by mouth daily. 08/20/21  Yes [name], MD  ?EDARBYCLOR 40-12.5 MG TABS Take 1 tablet by mouth daily. 09/19/21  Yes [name], MD  ?Wilder Glade 10 MG TABS tablet Take 1 tablet by mouth daily before breakfast. 09/19/21  Yes [name], MD  ?gabapentin (NEURONTIN) 100 MG capsule Take 1 capsule (100 mg total) by mouth 3 (three) times daily. ?Patient taking differently: Take 100-200 mg by mouth See admin instructions. 200 mg int the morning ?100 mg at bedtime 10/05/21  Yes Biagio Borg, MD  ?glucose blood (ACCU-CHEK GUIDE) test strip Use to check blood sugars five times daily. 04/21/21  Yes [name], MD  ?Insulin Glargine (BASAGLAR KWIKPEN [place]) Inject 30-50 Units into the skin daily. 30-35 units if below 150 ?50 units if over 150   Yes [provider]  ?Insulin Pen Needle 32G X 4 MM MISC Use to administer insulin four times a day 04/02/20  Yes [name], MD  ?levothyroxine (SYNTHROID) 88 MCG tablet Take 1 tablet (88 mcg total) by mouth daily. 08/27/21  Yes [name], MD  ?loratadine (CLARITIN) 10 MG tablet Take 1 tablet (10 mg total) by mouth daily. 04/21/21  Yes [name], MD  ?Semaglutide, 2 MG/DOSE, 8 MG/3ML SOPN Inject 2 mg as directed once a week. ?Patient  taking differently: Inject 2 mg as directed every 14 (fourteen) days. 08/28/21  Yes [name], MD  ?Continuous Blood Gluc Receiver (DEXCOM G6 RECEIVER) DEVI 1 Act by Does not apply route daily. 08/27/21   [name], MD  ?Continuous Blood Gluc Sensor (DEXCOM G6 SENSOR) MISC 1 Act by Does not apply route daily. 08/27/21   [name], MD  ?Continuous Blood Gluc Transmit (DEXCOM G6 TRANSMITTER) MISC 1 Act by Does not apply route daily. 08/27/21   [name], MD  ?Insulin NPH, Human,, Isophane, (NOVOLIN N FLEXPEN) 100 UNIT/ML Kiwkpen  Inject 35 Units into the skin every morning. And pen needles 1/day ?Patient not taking: Reported on 11/14/2021 11/02/21   Renato Shin, MD  ? ? ? ?Vital Signs: ?BP (!) 125/46 (BP Location: Left Arm)   Pulse 84   Temp 98.5 ?F (36.9 ?C) (Oral)   Resp 13   Ht _0  (1.575 m)   Wt 125 lb (56.7 kg)   SpO2 98%   BMI 22.86 kg/m?  ? ?Physical Exam ?Vitals reviewed.  ?HENT:  ?   Mouth/Throat:  ?   Mouth: Mucous membranes are moist.  ?Eyes:  ?   Extraocular Movements: Extraocular movements intact.  ?   Pupils: Pupils are equal, round, and reactive to light.  ?Abdominal:  ?   Palpations: Abdomen is soft.  ?   Tenderness: There is no abdominal tenderness.  ?Musculoskeletal:     ?   General: Normal range of motion.  ?   Cervical back: Normal range of motion.  ?   Right lower leg: No edema.  ?   Left lower leg: No edema.  ?Skin: ?   General: Skin is warm.  ?   Comments: Rt groin NT no bleeding ?No hematoma ?Rt foot good pulses  ?Neurological:  ?   Mental Status: She is alert and oriented to person, place, and time.  ?   Comments: Strength = Bilat ?Sensation = Bilat ?Moving all 4s equally  ?Psychiatric:     ?   Mood and Affect: Mood normal.     ?   Behavior: Behavior normal.     ?   Thought Content: Thought content normal.     ?   Judgment: Judgment normal.  ? ? ?Imaging: ?IR ANGIO INTRA EXTRACRAN SEL COM CAROTID INNOMINATE BILAT MOD SED ? ?Result Date: 11/18/2021 ?CLINICAL DATA:  History of TIAs. Severe left internal carotid proximal stenosis by ultrasound and MRA of the head and neck. EXAM: BILATERAL COMMON CAROTID AND INNOMINATE ANGIOGRAPHY COMPARISON:  MRI and MRA of the brain November 13, 2021. MEDICATIONS: Heparin 1000 units IV. No antibiotic was administered within 1 hour of the procedure. ANESTHESIA/SEDATION: Versed 1.5 mg IV; Fentanyl 37.5 mcg IV; hydralazine 5 mg for hypertension. Moderate Sedation Time:  25 minutes The patient was continuously monitored during the procedure by the interventional radiology nurse under my  direct supervision. CONTRAST:  Omnipaque 300 approximately 65 mL FLUOROSCOPY TIME:  Fluoroscopy Time: 7 minutes 30 seconds (671.3 mGy). COMPLICATIONS: None immediate. TECHNIQUE: Informed written consent was obtained from the patient after a thorough discussion of the procedural risks, benefits and alternatives. All questions were addressed. Maximal Sterile Barrier Technique was utilized including caps, mask, sterile gowns, sterile gloves, sterile drape, hand hygiene and skin antiseptic. A timeout was performed prior to the initiation of the procedure. The right groin was prepped and draped in the usual sterile fashion. Thereafter using modified Seldinger technique, transfemoral access into the right common  femoral artery was obtained without difficulty. Over a 0.035 inch guidewire, a 5 French Pinnacle sheath was inserted. Through this, and also over 0.035 inch guidewire, a 5 [place] JB 1 catheter was advanced to the aortic arch region and selectively positioned in the right common carotid artery, the left common carotid artery, the right vertebral artery and the subclavian artery. FINDINGS: The innominate arteriogram demonstrates mild narrowing of the proximal right subclavian artery. Right common carotid arteriogram demonstrated wide patency proximally. The right vertebral artery origin is widely patent. The vessel is seen to opacify to the cranial skull base. Mild FMD-like changes are seen in the vertical segment of the right vertebral artery at C1. Distal to this the right vertebrobasilar junction and the right posterior-inferior cerebellar artery demonstrate wide patency. Unopacified blood is seen in the basilar artery from contralateral left vertebral artery. The opacified basilar artery, the superior cerebellar arteries, the anterior inferior cerebellar arteries and the posterior cerebral arteries demonstrate opacification into the capillary and venous phases. Right common carotid arteriogram demonstrates  moderate stenosis at the origin of the right external carotid artery. Its branches opacify widely. The right internal carotid artery at the bulb demonstrates approximately 40% stenosis secondary to a posteriorly positioned no

## 2021-11-20 NOTE — Progress Notes (Signed)
PT Cancellation Note ? ?Patient Details ?Name: Sonia Skinner ?MRN: [id number] ?DOB: Jun 29, 1951 ? ? ?Cancelled Treatment:    Reason Eval/Treat Not Completed: PT screened, no needs identified, will sign off; RN reports patient mobilizing well, no PT needs.  ? ? ?Reginia Naas ?11/20/2021, 9:01 AM ?Magda Kiel, PT ?Acute Rehabilitation Services ?MBTDH:[phone] ?Office:[phone] ?11/20/2021 ? ?

## 2021-11-20 NOTE — Progress Notes (Signed)
OT Cancellation Note ? ?Patient Details ?Name: Sonia Skinner ?MRN: [id number] ?DOB: [birth year] ? ? ?Cancelled Treatment:    Reason Eval/Treat Not Completed: OT screened, no needs identified, will sign off ? ?Anastaisa Wooding D Jacquelina Hewins ?11/20/2021, 9:54 AM ?

## 2021-11-20 NOTE — Progress Notes (Addendum)
STROKE TEAM PROGRESS NOTE  ? ?INTERVAL HISTORY ? ?Patient is sitting up comfortably in a bedside chair.  She is doing well.  She has no complaints.  Had left carotid angioplasty with stenting uneventfully with Dr. [name].  Vital signs are stable.  Neuro exam is unchanged. Plan for discharge today.  ? ? ?Vitals:  ? 11/20/21 0900 11/20/21 1000 11/20/21 1100 11/20/21 1200  ?BP: (!) 132/46 (!) 123/42 (!) 141/61 (!) 144/44  ?Pulse: 84   85  ?Resp: '16 15 10 17  '$ ?Temp:    98.7 ?F (37.1 ?C)  ?TempSrc:    Oral  ?SpO2: 98%   98%  ?Weight:      ?Height:      ? ?CBC:  ?Recent Labs  ?Lab 11/18/21 ?1908 11/19/21 ?3329 11/20/21 ?0305 11/20/21 ?1026  ?WBC 10.8*   < > 14.8* 16.6*  ?NEUTROABS 5.9  --  10.5*  --   ?HGB 9.5*   < > 7.0* 8.2*  ?HCT 31.0*   < > 22.9* 26.2*  ?MCV 88.1   < > 88.8 89.4  ?PLT 207   < > 164 191  ? < > = values in this interval not displayed.  ? ? ?Basic Metabolic Panel:  ?Recent Labs  ?Lab 11/19/21 ?0619 11/19/21 ?1004 11/20/21 ?0305  ?NA 141 145 141  ?K 4.5 3.8 4.2  ?CL 113* 114* 118*  ?CO2 21*  --  19*  ?GLUCOSE 166* 134* 171*  ?BUN '19 16 20  '$ ?CREATININE 1.59* 1.30* 1.49*  ?CALCIUM 9.0  --  8.0*  ?MG 1.2*  --  1.3*  ? ? ?Lipid Panel:  ?Recent Labs  ?Lab 11/14/21 ?1434  ?CHOL 183  ?TRIG 198*  ?HDL 42  ?CHOLHDL 4.4  ?VLDL 40  ?[place] 101*  ? ? ?HgbA1c:  ?Recent Labs  ?Lab 11/14/21 ?1434  ?HGBA1C 7.8*  ? ? ?Urine Drug Screen: No results for input(s): LABOPIA, COCAINSCRNUR, LABBENZ, AMPHETMU, THCU, LABBARB in the last 168 hours.  ?Alcohol Level No results for input(s): ETH in the last 168 hours. ? ?IMAGING past 24 hours ?No results found. ? ?PHYSICAL EXAM ?General: Frail elderly African-American lady   in no acute distress ?Respiratory:  Regular, unlabored respirations on room air ? ?NEURO:  ?Mental Status: AA&Ox3  ?Speech/Language: speech is without dysarthria or aphasia.  Naming, repetition, fluency, and comprehension intact. ? ?Cranial Nerves:  ?II: PERRL. Visual fields full.  ?III, IV, VI: EOMI. Eyelids  elevate symmetrically.  ?V: Sensation is intact to light touch and symmetrical to face.  ?VII: Smile is symmetrical.   ?VIII: hearing intact to voice. ?IX, X: Phonation is normal.  ?JJ:OACZYSAY shrug 5/5. ?XII: tongue is midline without fasciculations. ?Motor: 5/5 strength to all muscle groups tested.  ?Tone: is normal and bulk is normal ?Sensation- Intact to light touch bilaterally.   ?Coordination: FTN intact bilaterally ?Gait- deferred ? ? ?ASSESSMENT/PLAN ?Sonia Skinner is a 71 y.o. female with history of DM2, GERD, small L MCA stroke, HTN, headaches and hypothyroidism presenting with an episode of impaired speech where her tongue felt thick and she was unable to get the words out.  This resolved spontaneously.  She has had several of these episodes over the last month.  MRI of the brain shows no acute stroke, but MRA of the neck shows 70% stenosis of the left ICA with possible ulcerated plaque.  Left internal carotid artery stenosis status post stent assisted angioplasty with distal protection of the left internal carotid artery proximal stenosis with R CFA  on 4/6 with Dr. [name] .  Plan to discharge today on ASA '81mg'$  and Brilinta '90mg'$  BID. ? ?Left hemispheric TIA:  in setting of 70% left ICA stenosis with ulcerated plaque s/p left ICA stent placement ?CT head No acute abnormality. Atrophy. Old left parietal cortex infarction ?MRI  Chronic left MCA infarct with small remote right thalamic lacunar infarcts, 1.3 cm left parotid lesion ?MRA  Negative for LVO or emergent findings, severe 70% stenosis of left ICA with atheromatous ulcer, 50-60% stenosis of bilateral carotid siphons, 54m outpouching from right ICA, 70% stenosis of left V1 segment ?2D Echo EF greater than 75%, hyperdynamic left ventricle function.  Grade 1 diastolic dysfunction. ?LDL 109 ?HgbA1c 8.0 ?VTE prophylaxis - SCDs, SQH ?   ?Diet  ? Diet heart healthy/carb modified Room service appropriate? Yes; Fluid consistency: Thin  ? ?aspirin 325 mg  daily prior to admission, now on aspirin 81 mg and Brilinta 90 mg twice daily ?Therapy recommendations:  no PT follow up ?Disposition:  pending ? ? ?Left ICA stenosis with left stent assisted angioplasty ?Follow up with Dr. DBennie Dallas?Discharge on ASA '81mg'$  and Brilinta 90 BID ? ?Hypertension ?Home meds:  Edarbyclor 40-12.5 mg daily ?Stable ?Keep BP <180-105 ?Long-term BP goal normotensive ? ?Hyperlipidemia ?Home meds:  atorvastatin 80 mg daily, resumed in hospital ?LDL 109, goal < 70 ?Add zetia 10 mg daily  ?Continue statin at discharge ? ?Diabetes type II Uncontrolled ?Home meds:  Farxiga 10 mg daily, insulin glargine 30-50 units daily ?HgbA1c 8.0, goal < 7.0 ?CBGs ?Recent Labs  ?  11/19/21 ?2152 11/20/21 ?0736 11/20/21 ?1109  ?GLUCAP 244* 216* 193*  ? ?  ?SSI ? ?Other Stroke Risk Factors ?Advanced Age >/= 659 ?Former cigarette smoker  ?Hx stroke ? ?Other Active Problems ?Left parotid lesion seen on MRI ?Recommend outpatient ENT follow up ?Hypothyroidism ?Continue home synthroid ?I have personally obtained history,examined this patient, reviewed notes, independently viewed imaging studies, participated in medical decision making and plan of care.ROS completed by me personally and pertinent positives fully documented  I have made any additions or clarifications directly to the above note. Agree with note above.  Continue aspirin and Brilinta for 6 months then aspirin alone and aggressive risk factor modification.  Follow-up with outpatient stroke clinic in 2 months.  Continue carotid stent surveillance with serial ultrasounds 6 monthly.  Greater than 50% time during the 35 visit was spent in counseling and coordination of care and discussion patient and care team and answering questions ? ?PAntony Contras MD ?Medical Director ?[facility] ?Pager: [phone]?11/20/2021 3:31 PM ? ?Hospital day # 5 ?To contact Stroke Continuity provider, please refer to [email or web address] ?After hours, contact General Neurology  ?

## 2021-11-22 ENCOUNTER — Other Ambulatory Visit: Payer: Self-pay | Admitting: Internal Medicine

## 2021-11-23 ENCOUNTER — Telehealth: Payer: Self-pay

## 2021-11-23 NOTE — Telephone Encounter (Signed)
Transition Care Management Follow-up Telephone Call ?Date of discharge and from where: [place] 11-20-21 Dx: TIA ?How have you been since you were released from the hospital? Doing good ?Any questions or concerns? No ? ?Items Reviewed: ?Did the pt receive and understand the discharge instructions provided? Yes  ?Medications obtained and verified? Yes  ?Other? No  ?Any new allergies since your discharge? No  ?Dietary orders reviewed? Yes ?Do you have support at home? Yes  ? ?Home Care and Equipment/Supplies: ?Were home health services ordered? no ?If so, what is the name of the agency? na  ?Has the agency set up a time to come to the patient's home? not applicable ?Were any new equipment or medical supplies ordered?  No ?What is the name of the medical supply agency? na ?Were you able to get the supplies/equipment? not applicable ?Do you have any questions related to the use of the equipment or supplies? no ? ?Functional Questionnaire: (I = Independent and D = Dependent) ?ADLs: I ? ?Bathing/Dressing- I ? ?Meal Prep- I ? ?Eating- I ? ?Maintaining continence- I ? ?Transferring/Ambulation- I ? ?Managing Meds- I ? ?Follow up appointments reviewed: ? ?PCP Hospital f/u appt confirmed? Yes  Scheduled to see Dr Ronnald Ramp on 11-26-21 @ 1pm. ?[facility] f/u appt confirmed? Yes  Scheduled to see Dr Scot Dock on 11-26-21 @ 320pm. ?Are transportation arrangements needed? No  ?If their condition worsens, is the pt aware to call PCP or go to the Emergency Dept.? Yes ?Was the patient provided with contact information for the PCP's office or ED? Yes ?Was to pt encouraged to call back with questions or concerns? Yes  ?

## 2021-11-24 ENCOUNTER — Telehealth: Payer: Self-pay | Admitting: *Deleted

## 2021-11-24 NOTE — Chronic Care Management (AMB) (Signed)
?  Care Management  ? ?Note ? ?11/24/2021 ?Name: Sonia Skinner MRN: [id number] DOB: [birth year] ? ?TIFFANIE BLASSINGAME is a 71 y.o. year old female who is a primary care patient of [name], MD. I reached out to [name] by phone today offer care coordination services.  ? ?Ms. Wisz was given information about care management services today including:  ?Care management services include personalized support from designated clinical staff supervised by her physician, including individualized plan of care and coordination with other care providers ?24/7 contact phone numbers for assistance for urgent and routine care needs. ?The patient may stop care management services at any time by phone call to the office staff. ? ?Patient agreed to services and verbal consent obtained.  ? ?Follow up plan: ?Telephone appointment with care management team member scheduled for: 12/16/2021 ? ?Jasline Buskirk, CCMA ?Care Guide, Embedded Care Coordination ?[place]  Care Management  ?Direct Dial: [phone] ? ? ?

## 2021-11-25 ENCOUNTER — Other Ambulatory Visit: Payer: Self-pay

## 2021-11-25 ENCOUNTER — Ambulatory Visit: Payer: Self-pay | Admitting: *Deleted

## 2021-11-25 ENCOUNTER — Emergency Department (HOSPITAL_COMMUNITY): Payer: Medicare Other

## 2021-11-25 ENCOUNTER — Encounter (HOSPITAL_COMMUNITY): Payer: Self-pay | Admitting: *Deleted

## 2021-11-25 ENCOUNTER — Emergency Department (HOSPITAL_COMMUNITY)
Admission: EM | Admit: 2021-11-25 | Discharge: 2021-11-25 | Disposition: A | Discharge status: Home / Self Care | Payer: Medicare Other | Attending: Emergency Medicine | Admitting: Emergency Medicine

## 2021-11-25 DIAGNOSIS — I10 Essential (primary) hypertension: Secondary | ICD-10-CM | POA: Insufficient documentation

## 2021-11-25 DIAGNOSIS — Z743 Need for continuous supervision: Secondary | ICD-10-CM | POA: Diagnosis not present

## 2021-11-25 DIAGNOSIS — Z7984 Long term (current) use of oral hypoglycemic drugs: Secondary | ICD-10-CM | POA: Diagnosis not present

## 2021-11-25 DIAGNOSIS — Z794 Long term (current) use of insulin: Secondary | ICD-10-CM | POA: Insufficient documentation

## 2021-11-25 DIAGNOSIS — R251 Tremor, unspecified: Secondary | ICD-10-CM | POA: Insufficient documentation

## 2021-11-25 DIAGNOSIS — I779 Disorder of arteries and arterioles, unspecified: Secondary | ICD-10-CM | POA: Diagnosis not present

## 2021-11-25 DIAGNOSIS — R739 Hyperglycemia, unspecified: Secondary | ICD-10-CM | POA: Diagnosis not present

## 2021-11-25 DIAGNOSIS — Z95828 Presence of other vascular implants and grafts: Secondary | ICD-10-CM | POA: Diagnosis not present

## 2021-11-25 DIAGNOSIS — Z8673 Personal history of transient ischemic attack (TIA), and cerebral infarction without residual deficits: Secondary | ICD-10-CM | POA: Insufficient documentation

## 2021-11-25 DIAGNOSIS — Z7982 Long term (current) use of aspirin: Secondary | ICD-10-CM | POA: Diagnosis not present

## 2021-11-25 DIAGNOSIS — G459 Transient cerebral ischemic attack, unspecified: Secondary | ICD-10-CM | POA: Diagnosis not present

## 2021-11-25 DIAGNOSIS — E119 Type 2 diabetes mellitus without complications: Secondary | ICD-10-CM | POA: Diagnosis not present

## 2021-11-25 DIAGNOSIS — I6523 Occlusion and stenosis of bilateral carotid arteries: Secondary | ICD-10-CM | POA: Diagnosis not present

## 2021-11-25 LAB — URINALYSIS, ROUTINE W REFLEX MICROSCOPIC
Bilirubin Urine: NEGATIVE
Glucose, UA: >=500 mg/dL — AB
Hgb urine dipstick: NEGATIVE
Ketones, ur: NEGATIVE mg/dL
Nitrite: NEGATIVE
Protein, ur: NEGATIVE mg/dL
Specific Gravity, Urine: 1.013 (ref 1.005–1.030)
pH: 5 (ref 5.0–8.0)

## 2021-11-25 LAB — CBC WITH DIFFERENTIAL/PLATELET
Abs Immature Granulocytes: 0.05 10*3/uL (ref 0.00–0.07)
Basophils Absolute: 0.1 10*3/uL (ref 0.0–0.1)
Basophils Relative: 1 %
Eosinophils Absolute: 0.5 10*3/uL (ref 0.0–0.5)
Eosinophils Relative: 4 %
HCT: 29.8 % — ABNORMAL LOW (ref 36.0–46.0)
Hemoglobin: 9.1 g/dL — ABNORMAL LOW (ref 12.0–15.0)
Immature Granulocytes: 0 %
Lymphocytes Relative: 23 %
Lymphs Abs: 2.9 10*3/uL (ref 0.7–4.0)
MCH: 27.6 pg (ref 26.0–34.0)
MCHC: 30.5 g/dL (ref 30.0–36.0)
MCV: 90.3 fL (ref 80.0–100.0)
Monocytes Absolute: 0.8 10*3/uL (ref 0.1–1.0)
Monocytes Relative: 6 %
Neutro Abs: 8.1 10*3/uL — ABNORMAL HIGH (ref 1.7–7.7)
Neutrophils Relative %: 66 %
Platelets: 245 10*3/uL (ref 150–400)
RBC: 3.3 MIL/uL — ABNORMAL LOW (ref 3.87–5.11)
RDW: 14.1 % (ref 11.5–15.5)
WBC: 12.5 10*3/uL — ABNORMAL HIGH (ref 4.0–10.5)
nRBC: 0 % (ref 0.0–0.2)

## 2021-11-25 LAB — HM DIABETES EYE EXAM

## 2021-11-25 LAB — PROTIME-INR
INR: 1.1 (ref 0.8–1.2)
Prothrombin Time: 13.9 seconds (ref 11.4–15.2)

## 2021-11-25 LAB — COMPREHENSIVE METABOLIC PANEL
ALT: 60 U/L — ABNORMAL HIGH (ref 0–44)
AST: 32 U/L (ref 15–41)
Albumin: 3.6 g/dL (ref 3.5–5.0)
Alkaline Phosphatase: 115 U/L (ref 38–126)
Anion gap: 13 (ref 5–15)
BUN: 29 mg/dL — ABNORMAL HIGH (ref 8–23)
CO2: 21 mmol/L — ABNORMAL LOW (ref 22–32)
Calcium: 9.3 mg/dL (ref 8.9–10.3)
Chloride: 104 mmol/L (ref 98–111)
Creatinine, Ser: 2.12 mg/dL — ABNORMAL HIGH (ref 0.44–1.00)
GFR, Estimated: 24 mL/min — ABNORMAL LOW (ref >60)
Glucose, Bld: 236 mg/dL — ABNORMAL HIGH (ref 70–99)
Potassium: 4.3 mmol/L (ref 3.5–5.1)
Sodium: 138 mmol/L (ref 135–145)
Total Bilirubin: 0.4 mg/dL (ref 0.3–1.2)
Total Protein: 6.8 g/dL (ref 6.5–8.1)

## 2021-11-25 NOTE — ED Notes (Signed)
ED Provider at bedside. 

## 2021-11-25 NOTE — ED Triage Notes (Signed)
Pt reports recent admission for TIA. This am woke up with "quivering" of her left lip which is abnormal. It last for short period of time, has resolved pta. No neuro deficits noted at triage. ?

## 2021-11-25 NOTE — ED Provider Notes (Signed)
?[facility] ?Provider Note ? ? ?CSN: [id number] ?Arrival date & time: 11/25/21  0930 ? ?  ? ?History ? ?Chief Complaint  ?Patient presents with  ? Tremors  ? ? ?Sonia Skinner is a 71 y.o. female. ? ?Patient states that she felt very anxious this morning.  Felt like she could not control her lip from shaking and quivering.  She felt overwhelmed and generally weak.  She had a TIA that she was admitted for recently and had a left carotid stent placed.  She has been taking all of her medications as prescribed.  She denies any loss of consciousness, vision changes, unilateral weakness or numbness or difficulties with speech.  Symptoms did not last very long.  She feels much better right now and overall wants to go home.  She has follow-up with vascular surgery tomorrow.  Family member at the bedside does not describe any seizure-like activity.  She denies any active weakness, numbness, speech change, vision changes.  No longer having anxiety feelings or shaking of her lips. ? ? ? ?  ? ?Home Medications ?Prior to Admission medications   ?Medication Sig Start Date End Date Taking? Authorizing Provider  ?Accu-Chek FastClix Lancets MISC Use to check blood sugar five times daily. 04/21/21   [name], MD  ?acetaminophen (TYLENOL) 500 MG tablet Take 1,000-1,500 mg by mouth every 6 (six) hours as needed for mild pain.    [provider]  ?Acetaminophen-Codeine (TYLENOL/CODEINE #3) 300-30 MG tablet Take 1 tablet by mouth every 6 (six) hours as needed for pain. 10/05/21   Biagio Borg, MD  ?amitriptyline (ELAVIL) 100 MG tablet Take 1 tablet (100 mg total) by mouth at bedtime. ?Patient taking differently: Take 100 mg by mouth at bedtime as needed for sleep. 08/28/21   Arfeen, Arlyce Harman, MD  ?amLODipine (NORVASC) 5 MG tablet Take 1 tablet (5 mg total) by mouth daily. 08/26/21   Freada Bergeron, MD  ?aspirin 81 MG chewable tablet Chew 1 tablet (81 mg total) by mouth daily. 11/21/21    Spero Geralds, MD  ?atorvastatin (LIPITOR) 80 MG tablet Take 0.5 tablets (40 mg total) by mouth daily. 11/20/21   Spero Geralds, MD  ?blood glucose meter kit and supplies KIT Use to test blood sugars 04/27/19   [name], MD  ?buPROPion (WELLBUTRIN XL) 300 MG 24 hr tablet Take 1 tablet (300 mg total) by mouth daily. 08/28/21   Arfeen, Arlyce Harman, MD  ?Cholecalciferol (VITAMIN D3 PO) Take 1 tablet by mouth daily. Unsure of dose    [provider]  ?Continuous Blood Gluc Receiver (DEXCOM G6 RECEIVER) DEVI 1 Act by Does not apply route daily. ?Patient not taking: Reported on 11/23/2021 08/27/21   [name], MD  ?Continuous Blood Gluc Sensor (DEXCOM G6 SENSOR) MISC 1 Act by Does not apply route daily. ?Patient not taking: Reported on 11/23/2021 08/27/21   [name], MD  ?Continuous Blood Gluc Transmit (DEXCOM G6 TRANSMITTER) MISC 1 Act by Does not apply route daily. ?Patient not taking: Reported on 11/23/2021 08/27/21   [name], MD  ?dexlansoprazole (DEXILANT) 60 MG capsule Take 1 capsule by mouth daily. 11/22/21   [name], MD  ?EDARBYCLOR 40-12.5 MG TABS Take 1 tablet by mouth daily. 09/19/21   [name], MD  ?ezetimibe (ZETIA) 10 MG tablet Take 1 tablet (10 mg total) by mouth daily. 11/21/21   Spero Geralds, MD  ?Imogene Burn  MG TABS tablet Take 1 tablet by mouth daily before breakfast. 09/19/21   [name], MD  ?gabapentin (NEURONTIN) 100 MG capsule Take 1 capsule (100 mg total) by mouth 3 (three) times daily. ?Patient taking differently: Take 100-200 mg by mouth See admin instructions. 200 mg int the morning ?100 mg at bedtime 10/05/21   Biagio Borg, MD  ?glucose blood (ACCU-CHEK GUIDE) test strip Use to check blood sugars five times daily. 04/21/21   [name], MD  ?Insulin Glargine (BASAGLAR KWIKPEN [place]) Inject 30-50 Units into the skin daily. 30-35 units if below 150 ?50 units if over 150    [provider]  ?Insulin NPH, Human,, Isophane, (NOVOLIN N FLEXPEN) 100  UNIT/ML Kiwkpen Inject 35 Units into the skin every morning. And pen needles 1/day ?Patient not taking: Reported on 11/14/2021 11/02/21   Renato Shin, MD  ?Insulin Pen Needle 32G X 4 MM MISC Use to administer insulin four times a day 04/02/20   [name], MD  ?levothyroxine (SYNTHROID) 88 MCG tablet Take 1 tablet (88 mcg total) by mouth daily. 08/27/21   [name], MD  ?loratadine (CLARITIN) 10 MG tablet Take 1 tablet (10 mg total) by mouth daily. 04/21/21   [name], MD  ?Semaglutide, 2 MG/DOSE, 8 MG/3ML SOPN Inject 2 mg as directed once a week. ?Patient taking differently: Inject 2 mg as directed every 14 (fourteen) days. 08/28/21   [name], MD  ?ticagrelor (BRILINTA) 90 MG TABS tablet Take 1 tablet (90 mg total) by mouth 2 (two) times daily. 11/20/21   Spero Geralds, MD  ?   ? ?Allergies    ?Anesthetics, ester   ? ?Review of Systems   ?Review of Systems ? ?Physical Exam ?Updated Vital Signs ?BP (!) 145/59   Pulse 87   Temp 97.8 ?F (36.6 ?C) (Oral)   Resp 16   SpO2 100%  ?Physical Exam ?Vitals and nursing note reviewed.  ?Constitutional:   ?   General: She is not in acute distress. ?   Appearance: She is well-developed.  ?HENT:  ?   Head: Normocephalic and atraumatic.  ?   Nose: Nose normal.  ?   Mouth/Throat:  ?   Mouth: Mucous membranes are moist.  ?Eyes:  ?   Extraocular Movements: Extraocular movements intact.  ?   Conjunctiva/sclera: Conjunctivae normal.  ?   Pupils: Pupils are equal, round, and reactive to light.  ?Cardiovascular:  ?   Rate and Rhythm: Normal rate and regular rhythm.  ?   Pulses: Normal pulses.  ?   Heart sounds: Normal heart sounds. No murmur heard. ?Pulmonary:  ?   Effort: Pulmonary effort is normal. No respiratory distress.  ?   Breath sounds: Normal breath sounds.  ?Abdominal:  ?   Palpations: Abdomen is soft.  ?   Tenderness: There is no abdominal tenderness.  ?Musculoskeletal:     ?   General: No swelling.  ?   Cervical back: Neck supple.  ?Skin: ?   General:  Skin is warm and dry.  ?   Capillary Refill: Capillary refill takes less than 2 seconds.  ?Neurological:  ?   General: No focal deficit present.  ?   Mental Status: She is alert and oriented to person, place, and time.  ?   Cranial Nerves: No cranial nerve deficit.  ?   Sensory: No sensory deficit.  ?   Motor: No weakness.  ?   Coordination: Coordination normal.  ?  Gait: Gait normal.  ?   Comments: 5+ out of 5 strength throughout, normal sensation, no drift, normal finger-nose-finger, normal speech  ?Psychiatric:     ?   Mood and Affect: Mood normal.  ? ? ?ED Results / Procedures / Treatments   ?Labs ?(all labs ordered are listed, but only abnormal results are displayed) ?Labs Reviewed  ?COMPREHENSIVE METABOLIC PANEL - Abnormal; Notable for the following components:  ?    Result Value  ? CO2 21 (*)   ? Glucose, Bld 236 (*)   ? BUN 29 (*)   ? Creatinine, Ser 2.12 (*)   ? ALT 60 (*)   ? GFR, Estimated 24 (*)   ? All other components within normal limits  ?URINALYSIS, ROUTINE W REFLEX MICROSCOPIC - Abnormal; Notable for the following components:  ? Glucose, UA >=500 (*)   ? Leukocytes,Ua TRACE (*)   ? Bacteria, UA RARE (*)   ? All other components within normal limits  ?CBC WITH DIFFERENTIAL/PLATELET - Abnormal; Notable for the following components:  ? WBC 12.5 (*)   ? RBC 3.30 (*)   ? Hemoglobin 9.1 (*)   ? HCT 29.8 (*)   ? Neutro Abs 8.1 (*)   ? All other components within normal limits  ?PROTIME-INR  ? ? ?EKG ?None ? ?Radiology ?CT Head Wo Contrast ? ?Result Date: 11/25/2021 ?CLINICAL DATA:  Transient ischemic attack, history diabetes mellitus, hypertension, stroke EXAM: CT HEAD WITHOUT CONTRAST TECHNIQUE: Contiguous axial images were obtained from the base of the skull through the vertex without intravenous contrast. RADIATION DOSE REDUCTION: This exam was performed according to the departmental dose-optimization program which includes automated exposure control, adjustment of the mA and/or kV according to patient  size and/or use of iterative reconstruction technique. COMPARISON:  11/13/2021 FINDINGS: Brain: Generalized atrophy. Normal ventricular morphology. No midline shift or mass effect. Small old posterior LEFT

## 2021-11-25 NOTE — ED Provider Triage Note (Signed)
Emergency Medicine Provider Triage Evaluation Note ? ?Sonia Skinner , a 71 y.o. female  was evaluated in triage.  Pt complains of right-sided lower lip quivering, bilateral upper extremity weakness, and subjective disorientation since this morning.  She had a left carotid stent placed last week.  She is concerned about potential stroke.  Denies fever, shortness of breath, abdominal pain/vomiting/diarrhea, known sick exposure.  She is currently eliciting no symptoms of recurrent or weakness but does still feel disorientated.  ? ?Review of Systems  ?Positive: Right-sided lower lip quivering ?Negative: Fever, shortness of breath, chest pain, abdominal pain, nausea/vomiting/diarrhea ? ?Physical Exam  ?BP (!) 157/56 (BP Location: Right Arm)   Pulse 86   Temp 98.1 ?F (36.7 ?C) (Oral)   Resp 17   SpO2 100%  ?Gen:   Awake, no distress acute distress ?Resp:  Normal effort normal and unlabored ?MSK:   Moves extremities without difficulty.  No observable weakness noted ?Other:  No cranial nerve deficit upon examination.  She is alert and oriented x3 ? ?Medical Decision Making  ?Medically screening exam initiated at 10:28 AM.  Appropriate orders placed.  TORY MCKISSACK was informed that the remainder of the evaluation will be completed by another provider, this initial triage assessment does not replace that evaluation, and the importance of remaining in the ED until their evaluation is complete. ? ?  ?Wilnette Kales, [place] ?11/25/21 1032 ? ?

## 2021-11-26 ENCOUNTER — Telehealth: Payer: Self-pay | Admitting: *Deleted

## 2021-11-26 ENCOUNTER — Encounter: Payer: Self-pay | Admitting: Internal Medicine

## 2021-11-26 ENCOUNTER — Ambulatory Visit: Payer: Medicare Other | Admitting: Vascular Surgery

## 2021-11-26 ENCOUNTER — Ambulatory Visit (INDEPENDENT_AMBULATORY_CARE_PROVIDER_SITE_OTHER): Payer: Medicare Other | Admitting: Internal Medicine

## 2021-11-26 VITALS — BP 126/78 | HR 95 | Temp 98.7°F | Ht 62.0 in | Wt 124.0 lb

## 2021-11-26 DIAGNOSIS — Z794 Long term (current) use of insulin: Secondary | ICD-10-CM

## 2021-11-26 DIAGNOSIS — K2100 Gastro-esophageal reflux disease with esophagitis, without bleeding: Secondary | ICD-10-CM

## 2021-11-26 DIAGNOSIS — E118 Type 2 diabetes mellitus with unspecified complications: Secondary | ICD-10-CM

## 2021-11-26 DIAGNOSIS — I63512 Cerebral infarction due to unspecified occlusion or stenosis of left middle cerebral artery: Secondary | ICD-10-CM

## 2021-11-26 DIAGNOSIS — I10 Essential (primary) hypertension: Secondary | ICD-10-CM | POA: Diagnosis not present

## 2021-11-26 DIAGNOSIS — E119 Type 2 diabetes mellitus without complications: Secondary | ICD-10-CM | POA: Diagnosis not present

## 2021-11-26 DIAGNOSIS — D539 Nutritional anemia, unspecified: Secondary | ICD-10-CM | POA: Insufficient documentation

## 2021-11-26 DIAGNOSIS — N1832 Chronic kidney disease, stage 3b: Secondary | ICD-10-CM

## 2021-11-26 LAB — BASIC METABOLIC PANEL
BUN: 39 mg/dL — ABNORMAL HIGH (ref 6–23)
CO2: 25 mEq/L (ref 19–32)
Calcium: 9.5 mg/dL (ref 8.4–10.5)
Chloride: 103 mEq/L (ref 96–112)
Creatinine, Ser: 2.43 mg/dL — ABNORMAL HIGH (ref 0.40–1.20)
GFR: 19.42 mL/min — ABNORMAL LOW (ref >60.00)
Glucose, Bld: 203 mg/dL — ABNORMAL HIGH (ref 70–99)
Potassium: 4.5 mEq/L (ref 3.5–5.1)
Sodium: 136 mEq/L (ref 135–145)

## 2021-11-26 LAB — VITAMIN B12: Vitamin B-12: 391 pg/mL (ref 211–911)

## 2021-11-26 LAB — FOLATE: Folate: 11.3 ng/mL (ref >5.9)

## 2021-11-26 MED ORDER — DEXCOM G7 SENSOR MISC: 1.0000 | Freq: Every day | 5 refills | Status: DC

## 2021-11-26 MED ORDER — DEXCOM G7 RECEIVER DEVI: 1.0000 | Freq: Every day | 5 refills | Status: DC

## 2021-11-26 MED ORDER — HUMALOG KWIKPEN 200 UNIT/ML ~~LOC~~ SOPN: 5.0000 [IU] | PEN_INJECTOR | Freq: Three times a day (TID) | SUBCUTANEOUS | 1 refills | Status: DC

## 2021-11-26 NOTE — Patient Instructions (Signed)
Type 2 Diabetes Mellitus, Diagnosis, Adult ?Type 2 diabetes (type 2 diabetes mellitus) is a long-term, or chronic, disease. In type 2 diabetes, one or both of these problems may be present: ?The pancreas does not make enough of a hormone called insulin. ?Cells in the body do not respond properly to the insulin that the body makes (insulin resistance). ?Normally, insulin allows blood sugar (glucose) to enter cells in the body. The cells use glucose for energy. Insulin resistance or lack of insulin causes excess glucose to build up in the blood instead of going into cells. This causes high blood glucose (hyperglycemia).  ?What are the causes? ?The exact cause of type 2 diabetes is not known. ?What increases the risk? ?The following factors may make you more likely to develop this condition: ?Having a family member with type 2 diabetes. ?Being overweight or obese. ?Being inactive (sedentary). ?Having been diagnosed with insulin resistance. ?Having a history of prediabetes, diabetes when you were pregnant (gestational diabetes), or polycystic ovary syndrome (PCOS). ?What are the signs or symptoms? ?In the early stage of this condition, you may not have symptoms. Symptoms develop slowly and may include: ?Increased thirst or hunger. ?Increased urination. ?Unexplained weight loss. ?Tiredness (fatigue) or weakness. ?Vision changes, such as blurry vision. ?Dark patches on the skin. ?How is this diagnosed? ?This condition is diagnosed based on your symptoms, your medical history, a physical exam, and your blood glucose level. Your blood glucose may be checked with one or more of the following blood tests: ?A fasting blood glucose (FBG) test. You will not be allowed to eat (you will fast) for 8 hours or longer before a blood sample is taken. ?A random blood glucose test. This test checks blood glucose at any time of day regardless of when you ate. ?An A1C (hemoglobin A1C) blood test. This test provides information about blood  glucose levels over the previous 2-3 months. ?An oral glucose tolerance test (OGTT). This test measures your blood glucose at two times: ?After fasting. This is your baseline blood glucose level. ?Two hours after drinking a beverage that contains glucose. ?You may be diagnosed with type 2 diabetes if: ?Your fasting blood glucose level is 126 mg/dL (7.0 mmol/L) or higher. ?Your random blood glucose level is 200 mg/dL (11.1 mmol/L) or higher. ?Your A1C level is 6.5% or higher. ?Your oral glucose tolerance test result is higher than 200 mg/dL (11.1 mmol/L). ?These blood tests may be repeated to confirm your diagnosis. ?How is this treated? ?Your treatment may be managed by a specialist called an endocrinologist. Type 2 diabetes may be treated by following instructions from your health care provider about: ?Making dietary and lifestyle changes. These may include: ?Following a personalized nutrition plan that is developed by a registered dietitian. ?Exercising regularly. ?Finding ways to manage stress. ?Checking your blood glucose level as often as told. ?Taking diabetes medicines or insulin daily. This helps to keep your blood glucose levels in the healthy range. ?Taking medicines to help prevent complications from diabetes. Medicines may include: ?Aspirin. ?Medicine to lower cholesterol. ?Medicine to control blood pressure. ?Your health care provider will set treatment goals for you. Your goals will be based on your age, other medical conditions you have, and how you respond to diabetes treatment. Generally, the goal of treatment is to maintain the following blood glucose levels: ?Before meals: 80-130 mg/dL (4.4-7.2 mmol/L). ?After meals: below 180 mg/dL (10 mmol/L). ?A1C level: less than 7%. ?Follow these instructions at home: ?Questions to ask your health care provider ?  Consider asking the following questions: ?Should I meet with a certified diabetes care and education specialist? ?What diabetes medicines do I need,  and when should I take them? ?What equipment will I need to manage my diabetes at home? ?How often do I need to check my blood glucose? ?Where can I find a support group for people with diabetes? ?What number can I call if I have questions? ?When is my next appointment? ?General instructions ?Take over-the-counter and prescription medicines only as told by your health care provider. ?Keep all follow-up visits. This is important. ?Where to find more information ?For help and guidance and for more information about diabetes, please visit: ?American Diabetes Association (ADA): www.diabetes.org ?American Association of Diabetes Care and Education Specialists (ADCES): www.diabeteseducator.org ?International Diabetes Federation (IDF): www.idf.org ?Contact a health care provider if: ?Your blood glucose is at or above 240 mg/dL (13.3 mmol/L) for 2 days in a row. ?You have been sick or have had a fever for 2 days or longer, and you are not getting better. ?You have any of the following problems for more than 6 hours: ?You cannot eat or drink. ?You have nausea and vomiting. ?You have diarrhea. ?Get help right away if: ?You have severe hypoglycemia. This means your blood glucose is lower than 54 mg/dL (3.0 mmol/L). ?You become confused or you have trouble thinking clearly. ?You have difficulty breathing. ?You have moderate or large ketone levels in your urine. ?These symptoms may represent a serious problem that is an emergency. Do not wait to see if the symptoms will go away. Get medical help right away. Call your local emergency services (911 in the [place] Do not drive yourself to the hospital. ?Summary ?Type 2 diabetes mellitus is a long-term, or chronic, disease. In type 2 diabetes, the pancreas does not make enough of a hormone called insulin, or cells in the body do not respond properly to insulin that the body makes. ?This condition is treated by making dietary and lifestyle changes and taking diabetes medicines or  insulin. ?Your health care provider will set treatment goals for you. Your goals will be based on your age, other medical conditions you have, and how you respond to diabetes treatment. ?Keep all follow-up visits. This is important. ?This information is not intended to replace advice given to you by your health care provider. Make sure you discuss any questions you have with your health care provider. ?Document Revised: 10/27/2020 Document Reviewed: 10/27/2020 ?Elsevier Patient Education ? 2022 Elsevier Inc. ? ?

## 2021-11-26 NOTE — Progress Notes (Signed)
? ?Subjective:  ?Patient ID: Sonia Skinner, female    DOB: Jan 08, 1951  Age: 71 y.o. MRN: [id number] ? ?CC: Anemia, Diabetes, and Hypertension ? ? ?HPI ?[name] presents for f/up - ? ?She recently had a CVA due to left ICA stenosis.  The carotid artery was stented.  Her remaining symptoms are short-term memory loss, trouble walking, and some visual disturbance.  She complains that this has made her anxious.  She does not want to take a medication for the anxiety but she is willing to speak to a therapist.  Her A1c was 7.8%.  She is not using the bolus insulin because her insurance would not pay for it. ? ?Admit date: 11/13/2021 ?Discharge date: 11/20/2021 ?  ?Admission Diagnoses: ?TIA ?Left ICA Stenosis s/p Stent placement.  ?  ?Discharge Diagnoses:  ?Principal Problem: ?  TIA (transient ischemic attack) ?Active Problems: ?  Essential hypertension, benign ?  Type II diabetes mellitus with manifestations ([place]) ?  Hypothyroidism ?  Hyperlipidemia with target LDL less than 70 ?  Major depressive disorder, recurrent episode ([place]) ?  GERD (gastroesophageal reflux disease) ?  Stage 3b chronic kidney disease ([place]) ?  Mass of left parotid gland ?  Left carotid stenosis ?  Internal carotid artery stenosis, left ? ?Outpatient Medications Prior to Visit  ?Medication Sig Dispense Refill  ? Accu-Chek FastClix Lancets MISC Use to check blood sugar five times daily. 510 each 2  ? acetaminophen (TYLENOL) 500 MG tablet Take 1,000-1,500 mg by mouth every 6 (six) hours as needed for mild pain.    ? amitriptyline (ELAVIL) 100 MG tablet Take 1 tablet (100 mg total) by mouth at bedtime. (Patient taking differently: Take 100 mg by mouth at bedtime as needed for sleep.) 90 tablet 0  ? amLODipine (NORVASC) 5 MG tablet Take 1 tablet (5 mg total) by mouth daily. 90 tablet 3  ? aspirin 81 MG chewable tablet Chew 1 tablet (81 mg total) by mouth daily. 30 tablet 1  ? atorvastatin (LIPITOR) 80 MG tablet Take 0.5 tablets (40 mg total) by mouth daily.  30 tablet 1  ? blood glucose meter kit and supplies KIT Use to test blood sugars 1 each 0  ? buPROPion (WELLBUTRIN XL) 300 MG 24 hr tablet Take 1 tablet (300 mg total) by mouth daily. 90 tablet 0  ? Cholecalciferol (VITAMIN D3 PO) Take 1 tablet by mouth daily. Unsure of dose    ? Continuous Blood Gluc Transmit (DEXCOM G6 TRANSMITTER) MISC 1 Act by Does not apply route daily. 1 each 5  ? dexlansoprazole (DEXILANT) 60 MG capsule Take 1 capsule by mouth daily. 90 capsule 0  ? EDARBYCLOR 40-12.5 MG TABS Take 1 tablet by mouth daily. 90 tablet 0  ? ezetimibe (ZETIA) 10 MG tablet Take 1 tablet (10 mg total) by mouth daily. 30 tablet 1  ? FARXIGA 10 MG TABS tablet Take 1 tablet by mouth daily before breakfast. 90 tablet 1  ? gabapentin (NEURONTIN) 100 MG capsule Take 1 capsule (100 mg total) by mouth 3 (three) times daily. (Patient taking differently: Take 100-200 mg by mouth See admin instructions. 200 mg int the morning ?100 mg at bedtime) 270 capsule 1  ? glucose blood (ACCU-CHEK GUIDE) test strip Use to check blood sugars five times daily. 500 each 2  ? Insulin Glargine (BASAGLAR KWIKPEN [place]) Inject 30-50 Units into the skin daily. 30-35 units if below 150 ?50 units if over 150    ? Insulin Pen Needle 32G  X 4 MM MISC Use to administer insulin four times a day 360 each 3  ? levothyroxine (SYNTHROID) 88 MCG tablet Take 1 tablet (88 mcg total) by mouth daily. 90 tablet 0  ? loratadine (CLARITIN) 10 MG tablet Take 1 tablet (10 mg total) by mouth daily. 90 tablet 0  ? Semaglutide, 2 MG/DOSE, 8 MG/3ML SOPN Inject 2 mg as directed once a week. (Patient taking differently: Inject 2 mg as directed every 14 (fourteen) days.) 9 mL 1  ? ticagrelor (BRILINTA) 90 MG TABS tablet Take 1 tablet (90 mg total) by mouth 2 (two) times daily. 60 tablet 1  ? Acetaminophen-Codeine (TYLENOL/CODEINE #3) 300-30 MG tablet Take 1 tablet by mouth every 6 (six) hours as needed for pain. 30 tablet 0  ? Continuous Blood Gluc Receiver (DEXCOM G6  RECEIVER) DEVI 1 Act by Does not apply route daily. 1 each 5  ? Continuous Blood Gluc Sensor (DEXCOM G6 SENSOR) MISC 1 Act by Does not apply route daily. 1 each 5  ? Insulin NPH, Human,, Isophane, (NOVOLIN N FLEXPEN) 100 UNIT/ML Kiwkpen Inject 35 Units into the skin every morning. And pen needles 1/day 45 mL 3  ? ?No facility-administered medications prior to visit.  ? ? ?ROS ?Review of Systems  ?Constitutional:  Negative for chills, diaphoresis, fatigue and fever.  ?HENT: Negative.    ?Eyes:  Positive for visual disturbance. Negative for photophobia.  ?Respiratory:  Negative for cough, chest tightness, shortness of breath and wheezing.   ?Cardiovascular:  Negative for chest pain, palpitations and leg swelling.  ?Gastrointestinal:  Negative for abdominal pain, constipation, diarrhea, nausea and vomiting.  ?Genitourinary:  Negative for difficulty urinating and enuresis.  ?Musculoskeletal:  Positive for gait problem. Negative for back pain.  ?Skin: Negative.  Negative for color change and pallor.  ?Neurological:  Negative for dizziness, weakness, light-headedness and headaches.  ?Hematological:  Negative for adenopathy. Does not bruise/bleed easily.  ?Psychiatric/Behavioral:  Negative for confusion, decreased concentration, dysphoric mood, sleep disturbance and suicidal ideas. The patient is nervous/anxious.   ? ?Objective:  ?BP 126/78 (BP Location: Right Arm, Patient Position: Sitting, Cuff Size: Large)   Pulse 95   Temp 98.7 ?F (37.1 ?C) (Oral)   Ht $R'5\' 2"'VO$  (1.575 m)   Wt 124 lb (56.2 kg)   SpO2 96%   BMI 22.68 kg/m?  ? ?BP Readings from Last 3 Encounters:  ?11/26/21 126/78  ?11/25/21 (!) 154/85  ?11/20/21 (!) 144/44  ? ? ?Wt Readings from Last 3 Encounters:  ?11/26/21 124 lb (56.2 kg)  ?11/19/21 125 lb (56.7 kg)  ?11/02/21 124 lb 12.8 oz (56.6 kg)  ? ? ?Physical Exam ?Vitals reviewed.  ?HENT:  ?   Nose: Nose normal.  ?   Mouth/Throat:  ?   Mouth: Mucous membranes are moist.  ?Eyes:  ?   General: No scleral  icterus. ?   Conjunctiva/sclera: Conjunctivae normal.  ?Cardiovascular:  ?   Rate and Rhythm: Normal rate and regular rhythm.  ?   Heart sounds: No murmur heard. ?Pulmonary:  ?   Effort: Pulmonary effort is normal.  ?   Breath sounds: No stridor. No wheezing, rhonchi or rales.  ?Abdominal:  ?   General: Abdomen is flat.  ?   Palpations: There is no mass.  ?   Tenderness: There is no abdominal tenderness. There is no guarding.  ?   Hernia: No hernia is present.  ?Musculoskeletal:  ?   Cervical back: Neck supple.  ?Lymphadenopathy:  ?   Cervical: No  cervical adenopathy.  ?Skin: ?   General: Skin is warm and dry.  ?Neurological:  ?   Mental Status: She is alert. Mental status is at baseline.  ?Psychiatric:     ?   Attention and Perception: Attention normal.     ?   Mood and Affect: Mood is anxious. Mood is not depressed. Affect is not labile.     ?   Speech: Speech normal.     ?   Behavior: Behavior normal. Behavior is cooperative.     ?   Thought Content: Thought content normal.  ? ? ?Lab Results  ?Component Value Date  ? WBC 12.5 (H) 11/25/2021  ? HGB 9.1 (L) 11/25/2021  ? HCT 29.8 (L) 11/25/2021  ? PLT 245 11/25/2021  ? GLUCOSE 203 (H) 11/26/2021  ? CHOL 183 11/14/2021  ? TRIG 198 (H) 11/14/2021  ? HDL 42 11/14/2021  ? LDLDIRECT 141 (H) 03/04/2010  ? LDLCALC 101 (H) 11/14/2021  ? ALT 60 (H) 11/25/2021  ? AST 32 11/25/2021  ? NA 136 11/26/2021  ? K 4.5 11/26/2021  ? CL 103 11/26/2021  ? CREATININE 2.43 (H) 11/26/2021  ? BUN 39 (H) 11/26/2021  ? CO2 25 11/26/2021  ? TSH 1.088 11/14/2021  ? INR 1.1 11/25/2021  ? HGBA1C 7.8 (H) 11/14/2021  ? MICROALBUR <0.7 04/07/2021  ? ? ?CT Head Wo Contrast ? ?Result Date: 11/25/2021 ?CLINICAL DATA:  Transient ischemic attack, history diabetes mellitus, hypertension, stroke EXAM: CT HEAD WITHOUT CONTRAST TECHNIQUE: Contiguous axial images were obtained from the base of the skull through the vertex without intravenous contrast. RADIATION DOSE REDUCTION: This exam was performed  according to the departmental dose-optimization program which includes automated exposure control, adjustment of the mA and/or kV according to patient size and/or use of iterative reconstruction technique. COMPARISON:  03/31

## 2021-11-26 NOTE — Chronic Care Management (AMB) (Signed)
?  Chronic Care Management  ? ?Note ? ?11/26/2021 ?Name: Sonia Skinner MRN: [id number] DOB: [birth year] ? ?Sonia Skinner is a 71 y.o. year old female who is a primary care patient of Ronnald Ramp Arvid Right, MD. Sonia Skinner is currently enrolled in care management services. An additional referral for Licensed Clinical SW was placed.  ? ?Follow up plan: ?Telephone appointment with care management team member scheduled for: 12/02/2021 ? ?Sonia Skinner, CCMA ?Care Guide, Embedded Care Coordination ?[facility]  ?Direct Dial: [phone] ? ? ?

## 2021-11-27 ENCOUNTER — Telehealth (HOSPITAL_COMMUNITY): Payer: 59 | Admitting: Psychiatry

## 2021-11-27 ENCOUNTER — Telehealth: Payer: Self-pay | Admitting: Internal Medicine

## 2021-11-27 NOTE — Telephone Encounter (Signed)
Rep w/ [facility] states they are calling on behalf of pt to request a cb to pt to discuss an order for physical and speech therapy ?

## 2021-11-28 ENCOUNTER — Telehealth: Payer: Medicare Other

## 2021-12-01 ENCOUNTER — Ambulatory Visit: Payer: Medicare Other | Admitting: Speech Pathology

## 2021-12-01 ENCOUNTER — Ambulatory Visit: Payer: Medicare Other | Attending: Internal Medicine

## 2021-12-01 ENCOUNTER — Encounter: Payer: Self-pay | Admitting: Speech Pathology

## 2021-12-01 DIAGNOSIS — R2681 Unsteadiness on feet: Secondary | ICD-10-CM | POA: Diagnosis not present

## 2021-12-01 DIAGNOSIS — R41841 Cognitive communication deficit: Secondary | ICD-10-CM

## 2021-12-01 DIAGNOSIS — I63512 Cerebral infarction due to unspecified occlusion or stenosis of left middle cerebral artery: Secondary | ICD-10-CM | POA: Insufficient documentation

## 2021-12-01 DIAGNOSIS — R2689 Other abnormalities of gait and mobility: Secondary | ICD-10-CM | POA: Diagnosis not present

## 2021-12-01 NOTE — Therapy (Signed)
?OUTPATIENT PHYSICAL THERAPY NEURO EVALUATION ? ? ?Patient Name: Sonia Skinner ?MRN: [id number] ?DOB:1950-10-12, 71 y.o., female ?Today's Date: 12/01/2021 ? ?PCP: [name], MD ?REFERRING PROVIDER: [name], MD ? ? PT End of Session - 12/01/21 1647   ? ? Visit Number 1   ? Number of Visits 17   ? Date for PT Re-Evaluation 01/26/22   ? Authorization Type UHC MC   ? Progress Note Due on Visit 10   ? PT Start Time 1015   ? PT Stop Time 1100   ? PT Time Calculation (min) 45 min   ? Equipment Utilized During Treatment Gait belt   ? Activity Tolerance Patient tolerated treatment well   ? Behavior During Therapy [facility] for tasks assessed/performed   ? ?  ?  ? ?  ? ? ?Past Medical History:  ?Diagnosis Date  ? Ankle fracture   ? Left  ? Anxiety   ? Carotid artery occlusion   ? Closed fracture of left distal fibula 09/16/2017  ? Complication of anesthesia   ? ALLERY TO ESTER BASE  ? Depression   ? early 64s  ? Diabetes mellitus   ? INSULIN DEPENDENT  ? GERD (gastroesophageal reflux disease)   ? Headache   ? years ago  ? Hypertension   ? Pneumonia   ? Stroke [facility]) March 2015  ?  left MCA infarct, slight weakness on left side  ? Thyroid disease   ? ?Past Surgical History:  ?Procedure Laterality Date  ? ABDOMINAL HYSTERECTOMY    ? ANTERIOR FIXATION AND POSTERIOR MICRODISCECTOMY CERVICAL SPINE  1999  ? CAROTID ENDARTERECTOMY Left 11-04-13  ? cea  ? ENDARTERECTOMY Left 11/04/2013  ? IR ANGIO INTRA EXTRACRAN SEL COM CAROTID INNOMINATE BILAT MOD SED  11/16/2021  ? IR ANGIO INTRA EXTRACRAN SEL COM CAROTID INNOMINATE UNI L MOD SED  11/19/2021  ? IR ANGIO VERTEBRAL SEL VERTEBRAL BILAT MOD SED  11/16/2021  ? IR CT HEAD LTD  11/19/2021  ? IR INTRAVSC STENT CERV CAROTID W/EMB-PROT MOD SED INCL ANGIO  11/19/2021  ? ORIF ANKLE FRACTURE Left 09/16/2017  ? Procedure: OPEN REDUCTION INTERNAL FIXATION (ORIF) LEFT ANKLE FRACTURE;  Surgeon: Marchia Bond, MD;  Location: [place];  Service: Orthopedics;  Laterality: Left;  ? RADIOLOGY WITH ANESTHESIA N/A  11/19/2021  ? Procedure: Left carotid angioplasty with possible stenting;  Surgeon: Luanne Bras, MD;  Location: [place];  Service: Radiology;  Laterality: N/A;  ? ?Patient Active Problem List  ? Diagnosis Date Noted  ? Deficiency anemia 11/26/2021  ? Multinodular goiter 11/02/2021  ? Encounter for general adult medical examination with abnormal findings 04/08/2021  ? Stage 3b chronic kidney disease ([place]) 04/02/2020  ? Visit for screening mammogram 04/01/2020  ? Type 2 diabetes mellitus with complication, with long-term current use of insulin ([place]) 09/18/2018  ? Age-related osteoporosis with current pathological fracture 02/20/2018  ? Intractable migraine without aura and with status migrainosus 11/24/2016  ? GERD (gastroesophageal reflux disease) 03/08/2016  ? Major depressive disorder, recurrent episode ([place]) 02/20/2016  ? Occlusion and stenosis of carotid artery with cerebral infarction 11/25/2013  ? Cerebrovascular accident (CVA) due to stenosis of left middle cerebral artery ([place]) 11/02/2013  ? Vitamin D deficiency 10/19/2013  ? Tobacco abuse 07/31/2013  ? Essential hypertension, benign 04/20/2013  ? Type II diabetes mellitus with manifestations ([place]) 04/20/2013  ? Hypothyroidism 04/20/2013  ? Hyperlipidemia with target LDL less than 70 04/20/2013  ? ? ?ONSET DATE: 11/13/21 ? ?REFERRING DIAG: D35.701 (ICD-10-CM) -  Cerebrovascular accident (CVA) due to stenosis of left middle cerebral artery ([place])  ? ?THERAPY DIAG:  ?Other abnormalities of gait and mobility ? ?Unsteadiness on feet ? ?SUBJECTIVE:  ?                                                                                                                                                                                           ? ?SUBJECTIVE STATEMENT: ?Pt had stroke like symptoms 08/13/21 and 08/15/22. Pt reports of having TIA on 11/13/21 for which she was hospitalized for from 11/13/21 to 11/20/21. She had caratoid artery stent put in wihle she was in  hospital. Martin Majestic to ED on 11/25/21 with R side of lower lip quivering, bilateral UE weakness and disorientation symptoms. Her remaining symptoms are some short term memory loss, trouble walking and some visual distrubances. ?Pt accompanied by: self ? ?PERTINENT HISTORY: PMH: DM, L MCA 10/2010, thyroid disease, PSH: 10/2013 carotid endarterectomy.  ? ?PAIN:  ?Are you having pain? No ? ?PRECAUTIONS: Fall ? ?WEIGHT BEARING RESTRICTIONS No ? ?FALLS: Has patient fallen in last 6 months? No and but reports of near falls at least once a day where she has to hold onto wall or furniture. ? ?LIVING ENVIRONMENT: ?Lives with: lives with their family and Sister ?Lives in: House/apartment ?Stairs: No ?Has following equipment at home: None ? ?PLOF: Independent ? ?PATIENT GOALS Improve walking ? ?OBJECTIVE:  ? ?DIAGNOSTIC FINDINGS: MRI and CT negative for stroke. MRA showed 70% stenosis of proximal cervical ICA  ? ?COGNITION: ?Overall cognitive status: Impaired: Memory: Deficits short term memory per pt report ?  ? ?MMT:   ? ?MMT Right ?12/01/2021 Left ?12/01/2021  ?Hip flexion 5 5  ?Hip extension    ?Hip abduction 5 5  ?Hip adduction 5 5  ?Hip internal rotation    ?Hip external rotation    ?Knee flexion 5 5  ?Knee extension 5 5  ?Ankle dorsiflexion 5 5  ?Ankle plantarflexion    ?Ankle inversion    ?Ankle eversion    ?(Blank rows = not tested) ? ?BED MOBILITY:  ?I grossly ? ?TRANSFERS: ?Assistive device utilized: None  ?Sit to stand: Complete Independence ?Stand to sit: Complete Independence ?Chair to chair: Complete Independence ? ? ?STAIRS: ? Level of Assistance: Complete Independence ? Stair Negotiation Technique: Alternating Pattern  with No Rails ? Number of Stairs: 4  ? Height of Stairs: 6"  ? ? ?GAIT: ?Gait pattern: decreased arm swing- Right, decreased arm swing- Left, and decreased stride length ?Distance walked: 44' ?Assistive device utilized: None ?Level of assistance: Complete Independence ? ? ?FUNCTIONAL TESTs:  ?5 times sit  to stand:  8 sec ?Functional gait assessment: 25/30 ?MCTSIB: Condition 1: Avg of 3 trials: 30 sec, Condition 2: Avg of 3 trials: 30 sec, Condition 3: Avg of 3 trials: 30 sec, Condition 4: Avg of 3 trials: 5 sec, and Total Score: 95/120 ? ? ?TODAY'S TREATMENT:  ?Gait training: cues for arm swings ?Reviewed seated horizontal and vertical head turns for HEP ? ? ?PATIENT EDUCATION: ?Education details: see above ?Person educated: Patient ?Education method: Explanation ?Education comprehension: verbalized understanding ? ? ?HOME EXERCISE PROGRAM: ?Access Code [id number] ? ? ? ?GOALS: ?Goals reviewed with patient? Yes ? ?SHORT TERM GOALS: Target date: 12/29/2021 ? ?Patient will demonstrate proper arm swings with walking to improve balance with walking without any verbal cues ?Baseline: Education initiated 12/01/21 ?Goal status: INITIAL ? ?2.  Patient will be compliant with HEP to self manage her symptoms ?Baseline: HEP initiated 12/01/21 ?Goal status: INITIAL ? ?3.  Patient will demo modified CTSIB score of >105/120 to improve functional standing balance and proprioception  ?Baseline: 95/120-12/01/21 ?Goal status: INITIAL ? ? ?LONG TERM GOALS: Target date: 01/26/2022 ? ?Pt will demo 120/120 on modified CTSIB to improve proprioception and standing balance. ?Baseline: 95/120-12/01/21 ?Goal status: INITIAL ? ?2.  Pt will demo 28/30 or better on FGA to improve functional balance with gait. ?Baseline: 25/30 ?Goal status: INITIAL ? ? ?ASSESSMENT: ? ?CLINICAL IMPRESSION: ?Patient is a 71 y.o. female who was seen today for physical therapy evaluation and treatment for gait and mobility disorder after recent TIA on 11/13/21.  ? ? ?OBJECTIVE IMPAIRMENTS Abnormal gait, decreased activity tolerance, decreased balance, decreased endurance, difficulty walking, decreased strength, decreased safety awareness, postural dysfunction, and pain.  ? ?ACTIVITY LIMITATIONS cleaning, community activity, meal prep, laundry, and yard work.  ? ?PERSONAL  FACTORS Time since onset of injury/illness/exacerbation are also affecting patient's functional outcome.  ? ? ?REHAB POTENTIAL: Good ? ?CLINICAL DECISION MAKING: Stable/uncomplicated ? ?EVALUATION COMPLEXITY: Low ? ?P

## 2021-12-01 NOTE — Therapy (Signed)
?OUTPATIENT SPEECH LANGUAGE PATHOLOGY EVALUATION ? ? ?Patient Name: Sonia Skinner ?MRN: [id number] ?DOB:Jul 21, 1951, 71 y.o., female ?Today's Date: 12/02/2021 ? ?PCP: [name], MD ?REFERRING PROVIDER: [name], MD ? ? End of Session - 12/02/21 0756   ? ? Visit Number 1   ? Number of Visits 17   ? Date for SLP Re-Evaluation 01/26/22   ? Authorization Type UHC Medicare   ? Progress Note Due on Visit 10   ? SLP Start Time 0930   ? SLP Stop Time  1015   ? SLP Time Calculation (min) 45 min   ? Activity Tolerance Patient tolerated treatment well   ? ?  ?  ? ?  ? ? ?Past Medical History:  ?Diagnosis Date  ? Ankle fracture   ? Left  ? Anxiety   ? Carotid artery occlusion   ? Closed fracture of left distal fibula 09/16/2017  ? Complication of anesthesia   ? ALLERY TO ESTER BASE  ? Depression   ? early 60s  ? Diabetes mellitus   ? INSULIN DEPENDENT  ? GERD (gastroesophageal reflux disease)   ? Headache   ? years ago  ? Hypertension   ? Pneumonia   ? Stroke [facility]) March 2015  ?  left MCA infarct, slight weakness on left side  ? Thyroid disease   ? ?Past Surgical History:  ?Procedure Laterality Date  ? ABDOMINAL HYSTERECTOMY    ? ANTERIOR FIXATION AND POSTERIOR MICRODISCECTOMY CERVICAL SPINE  1999  ? CAROTID ENDARTERECTOMY Left 11-04-13  ? cea  ? ENDARTERECTOMY Left 11/04/2013  ? IR ANGIO INTRA EXTRACRAN SEL COM CAROTID INNOMINATE BILAT MOD SED  11/16/2021  ? IR ANGIO INTRA EXTRACRAN SEL COM CAROTID INNOMINATE UNI L MOD SED  11/19/2021  ? IR ANGIO VERTEBRAL SEL VERTEBRAL BILAT MOD SED  11/16/2021  ? IR CT HEAD LTD  11/19/2021  ? IR INTRAVSC STENT CERV CAROTID W/EMB-PROT MOD SED INCL ANGIO  11/19/2021  ? ORIF ANKLE FRACTURE Left 09/16/2017  ? Procedure: OPEN REDUCTION INTERNAL FIXATION (ORIF) LEFT ANKLE FRACTURE;  Surgeon: Marchia Bond, MD;  Location: [place];  Service: Orthopedics;  Laterality: Left;  ? RADIOLOGY WITH ANESTHESIA N/A 11/19/2021  ? Procedure: Left carotid angioplasty with possible stenting;  Surgeon: Luanne Bras,  MD;  Location: [place];  Service: Radiology;  Laterality: N/A;  ? ?Patient Active Problem List  ? Diagnosis Date Noted  ? Deficiency anemia 11/26/2021  ? Multinodular goiter 11/02/2021  ? Encounter for general adult medical examination with abnormal findings 04/08/2021  ? Stage 3b chronic kidney disease ([place]) 04/02/2020  ? Visit for screening mammogram 04/01/2020  ? Type 2 diabetes mellitus with complication, with long-term current use of insulin ([place]) 09/18/2018  ? Age-related osteoporosis with current pathological fracture 02/20/2018  ? Intractable migraine without aura and with status migrainosus 11/24/2016  ? GERD (gastroesophageal reflux disease) 03/08/2016  ? Major depressive disorder, recurrent episode ([place]) 02/20/2016  ? Occlusion and stenosis of carotid artery with cerebral infarction 11/25/2013  ? Cerebrovascular accident (CVA) due to stenosis of left middle cerebral artery ([place]) 11/02/2013  ? Vitamin D deficiency 10/19/2013  ? Tobacco abuse 07/31/2013  ? Essential hypertension, benign 04/20/2013  ? Type II diabetes mellitus with manifestations ([place]) 04/20/2013  ? Hypothyroidism 04/20/2013  ? Hyperlipidemia with target LDL less than 70 04/20/2013  ? ? ?ONSET DATE: 11/13/2021  ? ?REFERRING DIAG: R15.400 (ICD-10-CM) - Cerebrovascular accident (CVA) due to stenosis of left middle cerebral artery ([place])  ? ?THERAPY DIAG:  ?  Cognitive communication deficit ? ?SUBJECTIVE:  ? ?SUBJECTIVE STATEMENT: ?"I had what I thought was a stroke. What I noticed beforehand it felt like my tongue was large and it made it difficult to speak."  ?Pt accompanied by: self ? ?PERTINENT HISTORY: Sonia Skinner is a 71 year old female with past medical history significant for type 2 diabetes mellitus, essential hypertension, ICA stenosis s/p left carotid endarterectomy 2015, CKD stage IIIb, GERD, history of CVA. Presented to ED on 3/31 with a visual changes described as "vision white out", feeling of tongue thickening, gait disturbance and  speech impairment. Speech impairment reported lasting about 30 minutes.  Additionally with fatigue and dizziness which prompted her to seek care in the ED. In the ED, CT head without contrast with no acute intracranial findings, noted old infarct left parietal cortex unchanged. Neurology was consulted.  Patient was transferred to [facility] for further stroke versus TIA work-up. On admission, MR brain no acute infarct, chronic left MCA infarct.  MRA head/neck 70% stenosis proximal cervical left ICA. She underwent  EEG with no seizures or epileptiform discharges. She underwent cerebral angiograph with left ICA stent placement on 11/19/21 which was well tolerated.   ? ?PAIN:  ?Are you having pain? No ? ? ?FALLS: Has patient fallen in last 6 months?  No ? ?LIVING ENVIRONMENT: ?Lives with: lives with their family ?Lives in: House/apartment ? ?PLOF:  ?Level of assistance: Independent with ADLs, Independent with IADLs ?Employment: Part-time employment, Oceanographer ? ? ?PATIENT GOALS "I'd like to find a way to lessen these things from happening."  ? ?OBJECTIVE:  ? ?DIAGNOSTIC FINDINGS: 11/25/21 CT HEAD WITHOUT CONTRAST FINDINGS: ?Brain: Generalized atrophy. Normal ventricular morphology. No ?midline shift or mass effect. Small old posterior LEFT parietal ?infarct in MCA territory. Small old lacunar infarct RIGHT thalamus. ?No intracranial hemorrhage, mass lesion, or evidence of acute ?infarction. No extra-axial fluid collections. ? ?COGNITION: ?Overall cognitive status: Impaired ?Attention: Impaired: Selective, Alternating, Divided ?Memory: Impaired: Working ?Short term ?Prospective ?Awareness: WFL ?Executive function: Impaired: Initiation, Problem solving, Organization, and Planning ?Behavior: Poor frustration tolerance ?Functional deficits: Unable to recall names of familiar people, difficulty managing medications- tells ST she cannot recall if she's taken or not, nervous about managing finances, not  currently cooking, unable to manage to-do list, organization difficulties- tells ST "my room is a mess but I can't figure out how to clean it."  ? ?COGNITIVE COMMUNICATION ?Following directions: WFL ?Auditory comprehension: WFL ?Verbal expression: Impaired: Pt reports occasional occurences of difficulty saying what she's thinking. X1 instance of word finding occurred this date.  ?Functional communication: Impaired: Endorses knowing what she wants to say but unable to get it out, ~3-4x/week. ST suspects 2/2 cognition impairments of organization, planning which could be aggravated by frustration tolerance and reported anxiety.  ? ?ORAL MOTOR EXAMINATION ?Not assessed this date. ? ?STANDARDIZED ASSESSMENTS: ?CLQT: Attention: Mild, Memory: WNL, Executive Function: WNL, Language: WNL, and Visuospatial Skills: Mild ?Though scores are WNL for domains of memory, executive functions, and language, all scores are noted to be low within that range.  ? ? PATIENT REPORTED OUTCOME MEASURES (PROM): ?Cognitive function: Short Form: 21 ?"A lot" of difficulty reading and following complex instructions, managing time to do daily tasks, learning new tasks.  ?"Sometimes (3-4x/week)" has had to read something multiple times, felt like thinking was slow, working really had to pay attention to avoid mistake, and having difficulty concentrating.  ? ?TODAY'S TREATMENT:  ?Education on evaluation findings, ST recommendations, POC. ST advises of importance of  implementing compensations for important and complex activities such as medication and financial management. Encouraged pt to purchase pill box organizer, ST to assist in creating routine for sorting/taking pills.  ? ? ?PATIENT EDUCATION: ?Education details: aee above ?Person educated: Patient ?Education method: Explanation, Demonstration, and Handouts ?Education comprehension: verbalized understanding, returned demonstration, and needs further education ? ? ? ? ?GOALS: ?Goals reviewed  with patient? Yes ? ?SHORT TERM GOALS: Target date: 12/30/2021 ? ?Pt will accurately implement medication management system with mod I over 2 sessions.  ?Baseline: ?Goal status: INITIAL ? ?2.  Pt will teach back x3

## 2021-12-02 ENCOUNTER — Ambulatory Visit: Payer: Medicare Other | Admitting: Licensed Clinical Social Worker

## 2021-12-02 DIAGNOSIS — F332 Major depressive disorder, recurrent severe without psychotic features: Secondary | ICD-10-CM

## 2021-12-02 NOTE — Patient Instructions (Signed)
Visit Information ? ?Thank you for taking time to visit with me today. Please don't hesitate to contact me if I can be of assistance to you before our next scheduled telephone appointment. ? ?Following are the goals we discussed today: Connecting with mental health provider ? ?Our next appointment is by telephone on April 26th at 11:30 ? ?Please call the care guide team at [phone] if you need to cancel or reschedule your appointment.  ? ?If you are experiencing a Mental Health or [place] or need someone to talk to, please call the [place] National Suicide Prevention Lifeline: (587) 287-9078 or TTY: [phone] TTY [phone]) to talk to a trained counselor ?call 1-800-273-TALK (toll free, 24 hour hotline)  ? ?Following is a copy of your full plan of care:  ?Care Plan : [place]  ?Updates made by [name], LCSW since 12/02/2021 12:00 AM  ?  ? ?Problem: Coping Skills   ?  ? ?Goal: Coping Skills Enhanced   ?Start Date: 12/02/2021  ?This Visit's Progress: On track  ?Priority: High  ?Note:   ?Current Barriers:  ?Disease Management support and education needs related to Depression: anxiety ?Stress ? ?CSW Clinical Goal(s):  ?Patient  will verbalize understanding of plan for management of Depression  through collaboration with Clinical Social Worker, provider, and care team.  ? ?Interventions: ?Inter-disciplinary care team collaboration (see longitudinal plan of care) ?Evaluation of current treatment plan related to  self management and patient's adherence to plan as established by provider ? ?Mental Health:  (Status: New goal.) ?Evaluation of current treatment plan related to Depression: anxiety ?Solution-Focused Strategies employed:  ?Problem Sonia Skinner strategies reviewed ?Reviewed mental health medications and discussed importance of compliance: currently taking Wellbutrin and Amitriptyline; does not think it is working ?Participation in counseling encouraged  ?Discussed  referral for psychiatry: currently seeing Dr. Adele Schilder ?Collaborated with [place] to get appointment for medication evaluation  ?Made referral to [facility] for counseling services ? ?Task & activities to accomplish goals: ?Call [place] to follow up on getting an appointment with Dr. Adele Schilder; I sent a message to the scheduler to contact you ?I have placed a referral with [facility] for counseling they will contact you.   ?  ? ?Sonia Skinner was given information about Care Management services by the embedded care coordination team including:  ?Care Management services include personalized support from designated clinical staff supervised by her physician, including individualized plan of care and coordination with other care providers ?24/7 contact phone numbers for assistance for urgent and routine care needs. ?The patient may stop CCM services at any time (effective at the end of the month) by phone call to the office staff. ? ?Patient agreed to services and verbal consent obtained.  ? ?Patient verbalizes understanding of instructions and care plan provided today and agrees to view in [place]. Active MyChart status confirmed with patient.   ? ?[name], LCSW ?Licensed Clinical Social Worker [name]  ?[place]  ?[phone]  ? ?  ?

## 2021-12-02 NOTE — Addendum Note (Signed)
Addended by: Su Monks on: 12/02/2021 08:23 AM ? ? Modules accepted: Orders ? ?

## 2021-12-02 NOTE — Chronic Care Management (AMB) (Signed)
Care Management ?Clinical Social Work Note ? ?12/02/2021 ?Name: Sonia Skinner MRN: [id number] DOB: Feb 10, 1951 ? ?[name] is a 71 y.o. year old female who is a primary care patient of Sonia Lima, MD.  The Care Management team was consulted for assistance with chronic disease management and coordination needs. ? ?Engaged with patient by telephone for initial visit in response to provider referral for social work chronic care management and care coordination services ? ?Consent to Services:  ?[name] was given information about Care Management services today including:  ?Care Management services includes personalized support from designated clinical staff supervised by her physician, including individualized plan of care and coordination with other care providers ?24/7 contact phone numbers for assistance for urgent and routine care needs. ?The patient may stop case management services at any time by phone call to the office staff. ? ?Patient agreed to services and consent obtained.  ? ?Summary: Assessed patient's previous and current treatment, coping skills, support system and barriers to care. Unable to complete full assessment as patient not feeling well. Referral made for counseling and collaboration for medication management to reconnect patient with Dr. Adele Skinner .  See Care Plan below for interventions and patient self-care actives. ? ?Recommendation: Patient may benefit from, and is in agreement to talk with LCSW next week to ongoing support and care coordination. .  ? ?Follow up Plan: Patient would like continued follow-up from CCM LCSW.  per patient's request will follow up in 1 week.  Will call office if needed prior to next encounter. ? ?Assessment: Review of patient past medical history, allergies, medications, and health status, including review of relevant consultants reports was performed today as part of a comprehensive evaluation and provision of chronic care management and care coordination  services. ? ?SDOH (Social Determinants of Health) assessments and interventions performed:  ?SDOH Interventions   ? ?Flowsheet Row Most Recent Value  ?SDOH Interventions   ?SDOH Interventions for the Following Domains Depression  ?Depression Interventions/Treatment  Referral to Psychiatry, Currently on Treatment  ? ?  ?  ? ?Advanced Directives Status: Not addressed in this encounter. ? ?Care Plan ?Conditions to be addressed/monitored: Depression;  ? ?Care Plan : LCSW Plan of Care  ?Updates made by Sonia Cane, LCSW since 12/02/2021 12:00 AM  ?  ? ?Problem: Coping Skills   ?  ? ?Goal: Coping Skills Enhanced   ?Start Date: 12/02/2021  ?This Visit's Progress: On track  ?Priority: High  ?Note:   ?Current Barriers:  ?Disease Management support and education needs related to Depression: anxiety ?Stress ? ?CSW Clinical Goal(s):  ?Patient  will verbalize understanding of plan for management of Depression  through collaboration with Clinical Social Worker, provider, and care team.  ? ?Interventions: ?Inter-disciplinary care team collaboration (see longitudinal plan of care) ?Evaluation of current treatment plan related to  self management and patient's adherence to plan as established by provider ? ?Mental Health:  (Status: New goal.) ?Evaluation of current treatment plan related to Depression: anxiety ?Solution-Focused Strategies employed:  ?Problem [place] strategies reviewed ?Reviewed mental health medications and discussed importance of compliance: currently taking Wellbutrin and Amitriptyline; does not think it is working ?Participation in counseling encouraged  ?Discussed referral for psychiatry: currently seeing Dr. Adele Skinner ?Collaborated with [place] to get appointment for medication evaluation  ?Made referral to [facility] for counseling services ? ?Task & activities to accomplish goals: ?Call [facility] to follow up on getting an appointment with Dr. Adele Skinner;  I  sent a message to the scheduler to contact you ?I have placed a referral with [facility] for counseling they will contact you.   ?  ? Sonia Lanius, LCSW ?Licensed Clinical Social Worker Sonia Skinner Management  ?[place]  ?[phone]  ?

## 2021-12-07 LAB — RETICULOCYTES
ABS Retic: 110220 cells/uL — ABNORMAL HIGH (ref 20000–80000)
Retic Ct Pct: 3.3 %

## 2021-12-07 LAB — VITAMIN B1: Vitamin B1 (Thiamine): 9 nmol/L (ref 8–30)

## 2021-12-08 ENCOUNTER — Other Ambulatory Visit: Payer: Self-pay | Admitting: *Deleted

## 2021-12-08 ENCOUNTER — Telehealth: Payer: Self-pay

## 2021-12-08 NOTE — Patient Outreach (Signed)
[facility]) Care Management ? ?12/08/2021 ? ?Sonia Skinner ?08-10-1951 ?[id number] ? ?Successful telephone outreach call to patient in response to VM she left for this nurse to call her back. HIPAA identifiers obtained. Patient states that she needs a new calendar booklet. The dates on the one she has are not current and she reports she uses this booklet daily.  ? ?Plan: Nurse will send patient a new calendar booklet.  ? ?[name] RN, BSN ?Nurse Health Coach ?[place] ?[phone] ?[email or web address] ? ? ? ? ?

## 2021-12-08 NOTE — Telephone Encounter (Signed)
[facility] pharmacy called stating that they reached out to the pt and she stated she didn't know anything about the Dexcom products that were ordered for her and she wouldn't verify her identity. ? ?I called pt to advise her to call [facility] back at [phone] in order for them to get her the Dexcom supplies. ? ?FYI ?

## 2021-12-09 ENCOUNTER — Ambulatory Visit: Payer: Medicare Other | Admitting: Speech Pathology

## 2021-12-09 ENCOUNTER — Ambulatory Visit: Payer: Medicare Other | Admitting: Physical Therapy

## 2021-12-09 ENCOUNTER — Telehealth: Payer: Self-pay | Admitting: Physical Therapy

## 2021-12-09 ENCOUNTER — Telehealth (HOSPITAL_COMMUNITY): Payer: Self-pay | Admitting: Psychiatry

## 2021-12-09 ENCOUNTER — Encounter: Payer: Self-pay | Admitting: Licensed Clinical Social Worker

## 2021-12-09 ENCOUNTER — Ambulatory Visit: Payer: Medicare Other | Admitting: Licensed Clinical Social Worker

## 2021-12-09 DIAGNOSIS — R2681 Unsteadiness on feet: Secondary | ICD-10-CM

## 2021-12-09 DIAGNOSIS — F332 Major depressive disorder, recurrent severe without psychotic features: Secondary | ICD-10-CM

## 2021-12-09 DIAGNOSIS — Z7189 Other specified counseling: Secondary | ICD-10-CM

## 2021-12-09 DIAGNOSIS — R2689 Other abnormalities of gait and mobility: Secondary | ICD-10-CM | POA: Diagnosis not present

## 2021-12-09 DIAGNOSIS — I63512 Cerebral infarction due to unspecified occlusion or stenosis of left middle cerebral artery: Secondary | ICD-10-CM | POA: Diagnosis not present

## 2021-12-09 DIAGNOSIS — R41841 Cognitive communication deficit: Secondary | ICD-10-CM

## 2021-12-09 NOTE — Patient Instructions (Addendum)
Visit Information ? ?Thank you for taking time to visit with me today. Please don't hesitate to contact me if I can be of assistance to you before our next scheduled telephone appointment. ? ?Following are the goals we discussed today: mental health treatment ?Task & activities to accomplish goals: ?Keep appointment with Dr. Adele Schilder May 1st ?Complete paperwork for [facility] so that they can schedule your appointment ?  ?Our next appointment is by telephone on May 15th  at 10:00 ? ?Please call the care guide team at [phone] if you need to cancel or reschedule your appointment.  ? ?If you are experiencing a Mental Health or [place] or need someone to talk to, please call the Suicide and Crisis Lifeline: 988 ?call the [place] National Suicide Prevention Lifeline: 9847140944 or TTY: 989-311-0022 TTY 272-846-1897) to talk to a trained counselor ?call 1-800-273-TALK (toll free, 24 hour hotline) ?go to [facility] [address] [place] [phone])  ? ?The patient verbalized understanding of instructions, educational materials, and care plan provided today and agreed to receive a mailed copy of patient instructions, educational materials, and care plan.  ? ?[name], LCSW ?Licensed Clinical Social Worker [name]  ?[place]  ?[phone]  ?

## 2021-12-09 NOTE — Chronic Care Management (AMB) (Signed)
Care Management ?Clinical Social Work Note ? ?12/09/2021 ?Name: ANVITA HIRATA MRN: [id number] DOB: 08/25/1950 ? ?Sonia Skinner is a 71 y.o. year old female who is a primary care patient of [name], MD.  The Care Management team was consulted for assistance with chronic disease management and coordination needs. ? ?Engaged with patient by telephone for follow up visit in response to provider referral for social work chronic care management and care coordination services ? ?Consent to Services:  ?Ms. Bordeau was given information about Care Management services today including:  ?Care Management services includes personalized support from designated clinical staff supervised by her physician, including individualized plan of care and coordination with other care providers ?24/7 contact phone numbers for assistance for urgent and routine care needs. ?The patient may stop case management services at any time by phone call to the office staff. ? ?Patient agreed to services and consent obtained.  ? ?Summary:  Patient continues to experience difficulty with connecting for mental health treatment. Assisted patient with getting psychiatry appointment and encouraged her to completed paperwork for therapy with [facility]..  See Care Plan below for interventions and patient self-care actives. ?Other: Patient also expressed concern with when to take and how much with dose for insulin and concerns with back pain.  She states she has not discussed these concerns with PCP. ? ?Recommendation: Patient may benefit from, and is in agreement to contact PCP office for clarification on medication and concerns with back pain..  ? ?Follow up Plan: Patient would like continued follow-up from CCM LCSW.  per patient's request will follow up in 3 weeks.  Will call office if needed prior to next encounter. ?  ?Assessment: Review of patient past medical history, allergies, medications, and health status, including review of relevant  consultants reports was performed today as part of a comprehensive evaluation and provision of chronic care management and care coordination services. ? ?SDOH (Social Determinants of Health) assessments and interventions performed:  ?SDOH Interventions   ? ?Flowsheet Row Most Recent Value  ?SDOH Interventions   ?SDOH Interventions for the Following Domains Depression, Stress  ?Depression Interventions/Treatment  Referral to Psychiatry, Counseling  ? ?  ?  ? ?Advanced Directives Status: Not addressed in this encounter. ? ?Care Plan ?Conditions to be addressed/monitored: Anxiety and Depression; Memory Deficits ? ?Care Plan : LCSW Plan of Care  ?Updates made by [name], LCSW since 12/09/2021 12:00 AM  ?  ? ?Problem: Coping Skills   ?  ? ?Goal: Coping Skills Enhanced   ?Start Date: 12/02/2021  ?This Visit's Progress: On track  ?Recent Progress: On track  ?Priority: High  ?Note:   ?Current Barriers:  ?Disease Management support and education needs related to Depression: anxiety ?Stress  and concern with memory loss and difficulty with organizing thoughts   ? ?CSW Clinical Goal(s):  ?Patient  will verbalize understanding of plan for management of Depression  through collaboration with Clinical Social Worker, provider, and care team.  ? ?Interventions: ?Inter-disciplinary care team collaboration (see longitudinal plan of care) ?Evaluation of current treatment plan related to  self management and patient's adherence to plan as established by provider ? ?Mental Health:  (Status: Goal on Track (progressing): YES.) ?Evaluation of current treatment plan related to Depression: anxiety ?Solution-Focused Strategies employed:  ?Problem [place] strategies reviewed ?Discussed referral for psychiatry: currently seeing Dr. Adele Schilder ?Collaborated with [place] to get appointment for medication evaluation appointment May 1st  ?Made referral to [facility] for counseling services / intake  packet received patient needs to complete ? ?Task & activities to accomplish goals: ?Keep appointment with Dr. Adele Schilder May 1st ?Complete paperwork for [facility] so that they can schedule your appointment ?  ?  ?[name], LCSW ?Licensed Clinical Social Worker [name]  ?[place]  ?[phone]  ?

## 2021-12-09 NOTE — Telephone Encounter (Signed)
Dr. Ronnald Ramp, ?Odeth Bry was evaluated by PT on 12/01/21.  The patient would benefit from an occupational therapy evaluation for handwriting, bilateral hand tremor and hand weakness.   ?If you agree, please place an order in [facility] workque in [facility] or fax the order to [phone]. ? ?Thank you, ?Cruzita Lederer Amadi Yoshino, PT, DPT ?[place] ?[address] ?[address] ?[place], [place]  [address] ?Phone:  [phone] ?Fax:  [phone] ? ? ? ?

## 2021-12-09 NOTE — Therapy (Signed)
?OUTPATIENT SPEECH LANGUAGE PATHOLOGY TREATMENT NOTE ? ? ?Patient Name: Sonia Skinner ?MRN: [id number] ?DOB:05/31/1951, 71 y.o., female ?Today's Date: 12/09/2021 ? ?PCP: [name], MD ?REFERRING PROVIDER: [name], MD ? ?END OF SESSION:  ? End of Session - 12/09/21 1447   ? ? Visit Number 2   ? Number of Visits 17   ? Date for SLP Re-Evaluation 01/26/22   ? Authorization Type [employer] Medicare   ? Progress Note Due on Visit 10   ? SLP Start Time 1447   ? SLP Stop Time  1528   ? SLP Time Calculation (min) 41 min   ? Activity Tolerance Patient tolerated treatment well   ? ?  ?  ? ?  ? ? ?Past Medical History:  ?Diagnosis Date  ? Ankle fracture   ? Left  ? Anxiety   ? Carotid artery occlusion   ? Closed fracture of left distal fibula 09/16/2017  ? Complication of anesthesia   ? ALLERY TO ESTER BASE  ? Depression   ? early 49s  ? Diabetes mellitus   ? INSULIN DEPENDENT  ? GERD (gastroesophageal reflux disease)   ? Headache   ? years ago  ? Hypertension   ? Pneumonia   ? Stroke [facility] March 2015  ?  left MCA infarct, slight weakness on left side  ? Thyroid disease   ? ?Past Surgical History:  ?Procedure Laterality Date  ? ABDOMINAL HYSTERECTOMY    ? ANTERIOR FIXATION AND POSTERIOR MICRODISCECTOMY CERVICAL SPINE  1999  ? CAROTID ENDARTERECTOMY Left 11-04-13  ? cea  ? ENDARTERECTOMY Left 11/04/2013  ? IR ANGIO INTRA EXTRACRAN SEL COM CAROTID INNOMINATE BILAT MOD SED  11/16/2021  ? IR ANGIO INTRA EXTRACRAN SEL COM CAROTID INNOMINATE UNI L MOD SED  11/19/2021  ? IR ANGIO VERTEBRAL SEL VERTEBRAL BILAT MOD SED  11/16/2021  ? IR CT HEAD LTD  11/19/2021  ? IR INTRAVSC STENT CERV CAROTID W/EMB-PROT MOD SED INCL ANGIO  11/19/2021  ? ORIF ANKLE FRACTURE Left 09/16/2017  ? Procedure: OPEN REDUCTION INTERNAL FIXATION (ORIF) LEFT ANKLE FRACTURE;  Surgeon: Marchia Bond, MD;  Location: [facility];  Service: Orthopedics;  Laterality: Left;  ? RADIOLOGY WITH ANESTHESIA N/A 11/19/2021  ? Procedure: Left carotid angioplasty with possible stenting;  Surgeon:  Luanne Bras, MD;  Location: [facility];  Service: Radiology;  Laterality: N/A;  ? ?Patient Active Problem List  ? Diagnosis Date Noted  ? Deficiency anemia 11/26/2021  ? Multinodular goiter 11/02/2021  ? Encounter for general adult medical examination with abnormal findings 04/08/2021  ? Stage 3b chronic kidney disease ([place]) 04/02/2020  ? Visit for screening mammogram 04/01/2020  ? Type 2 diabetes mellitus with complication, with long-term current use of insulin ([place]) 09/18/2018  ? Age-related osteoporosis with current pathological fracture 02/20/2018  ? Intractable migraine without aura and with status migrainosus 11/24/2016  ? GERD (gastroesophageal reflux disease) 03/08/2016  ? Major depressive disorder, recurrent episode ([place]) 02/20/2016  ? Occlusion and stenosis of carotid artery with cerebral infarction 11/25/2013  ? Cerebrovascular accident (CVA) due to stenosis of left middle cerebral artery ([place]) 11/02/2013  ? Vitamin D deficiency 10/19/2013  ? Tobacco abuse 07/31/2013  ? Essential hypertension, benign 04/20/2013  ? Type II diabetes mellitus with manifestations ([place]) 04/20/2013  ? Hypothyroidism 04/20/2013  ? Hyperlipidemia with target LDL less than 70 04/20/2013  ? ? ?ONSET DATE: 11/13/2021 ? ?REFERRING DIAG: U54.270 (ICD-10-CM) - Cerebrovascular accident (CVA) due to stenosis of left middle cerebral artery ([place])  ? ?  THERAPY DIAG:  ?Cognitive communication deficit ? ?SUBJECTIVE: "no real change, pretty much like I was" ? ?PAIN:  ?Are you having pain? Yes ?NPRS scale: 4/10 ?Pain location: stomach, lower back ? ? ?OBJECTIVE:  ? ?TODAY'S TREATMENT:  ?12-09-21: Target verbal expression compensations d/t pt c/o anomia in conversations. Education on strategies of using written aid when able, rehearsing for telephone calls. No overt word-finding difficulties observed throughout conversational speech sample of about 20 minutes. Generate medication management strategies. Pt to buy pill box and bring that and meds  to following appointment.  ?  ?  ?PATIENT EDUCATION: ?Education details: aee above ?Person educated: Patient ?Education method: Explanation, Demonstration, and Handouts ?Education comprehension: verbalized understanding, returned demonstration, and needs further education ? ?HOME EDUCATION PROGRAM:  ?  ?  ?  ?GOALS: ?Goals reviewed with patient? Yes ?  ?SHORT TERM GOALS: Target date: 12/30/2021 ?  ?Pt will accurately implement medication management system with mod I over 2 sessions.  ?Baseline: ?Goal status: ongoing ?  ?2.  Pt will teach back x3 attention and memory compensations/strategies, and ID functional use for at home, with occasional min-A ?Baseline:  ?Goal status: ongoing ?  ?3.  Pt will verbalize x2 anomia compensations she can implement to reduce feelings of frustration when word finding difficulty occurs with occasional min-A ?Baseline:  ?Goal status: ongoing ?  ?LONG TERM GOALS: Target date: 02/24/2022 ?  ?Pt will report improved cognitive linguistic functioning via PROM by 2 points at last ST session ?Baseline:  ?Goal status: ongoing ?  ?2.  Pt will report successful implementation of medication management system with no missed doses over 1 week period  ?Baseline:  ?Goal status: ongoing ?  ?3.  Pt will use appropriate external aids for recall and management of finances, schedules, appointments, to-do's with occasional min A over 1 week ?Baseline:  ?Goal status: ongoing ?  ?4.  Pt will demonstrates [facility] verbal expression during mod-complex conversation of at least 15 minutes ?Baseline:  ?Goal status: ongoing ?  ?  ?ASSESSMENT: ?  ?CLINICAL IMPRESSION: ?Patient is a 71 y.o. F who was seen today for cognitive communication changes s/p left ICA stent placement. Pt reports that in Dec 2022, she began to notice changes in her thinking. She went to hospital and was d/c. Returned in March 2023 and was found to have blockage in L ICA, had stent placed. It is unclear if this is in relation to cognitive changes  experienced by pt. Pt currently requiring moderate amount of assistance from sister in managing iADLs such as financial management, medication management, cooking, shopping, schedule management. CLQT does not appear to fully reflect the functional deficits pt is experiencing at home. Deficits appear to be impacting pt's QoL d/t increased anxiety around difficulties, decreased participation in meaningful activities, and reliance on sister for assistance. ?  ?First change pt noticed was inability to recall names of students with whom she was familiar, previous had no difficulty. Has attempted to implement medication management system, reportedly still having difficulties. Tells ST, "I can't remember if I have taken them or not." Pt is currently managing finances but again questions completeness of tasks and requires double checking to ensure bills are paid. Energy and motivation factors impacting ability to complete daily to-do list. Organizational issues, tells ST "my room is a disaster and I can't seem to get it organized." Is not currently grocery shopping or cooking, relying on sister. Pt also reports 3-4x/week occurences of feeling "tongue tied." Endorses knowing what she'd like to say  but is unable to "get it out." Reports feels of tongue swelling which sometimes, yet not always, coincides with expressive verbal impairment. X1 occurrence of word finding difficulties observed this date.  ?  ?I recommend skilled ST to address functional impairments 2/2 cognitive communication deficits in areas of memory, attention, executive functioning. Additionally will provide pt with anomia strategies for when reported aphasia-like events occur.  ?  ?OBJECTIVE IMPAIRMENTS include attention, memory, executive functioning, expressive language, and dysarthria. These impairments are limiting patient from managing medications, managing appointments, managing finances, household responsibilities, and effectively communicating at  home and in community. No overt factors affecting potential to achieve goals and functional outcomes observed or reported this date. Patient will benefit from skilled SLP services to address above impairments

## 2021-12-09 NOTE — Therapy (Signed)
?OUTPATIENT PHYSICAL THERAPY TREATMENT NOTE ? ? ?Patient Name: Sonia Skinner ?MRN: [id number] ?DOB:[birth year], 71 y.o., female ?Today's Date: 12/09/2021 ? ?PCP: [name], MD ?REFERRING PROVIDER: [name], MD ? ?PHYSICAL THERAPY DISCHARGE SUMMARY ? ?Visits from Start of Care: 2 ? ?Current functional level related to goals / functional outcomes: ?See below  ?  ?Remaining deficits: ?Decreased arm swing with gait  ?  ?Education / Equipment: ?HEP, obtaining new vestibular PT referral if balance changes in future   ? ?Patient agrees to discharge. Patient goals were  discontinued due to pt not requesting PT at this time . Patient is being discharged due to the patient's request. ? ? ?END OF SESSION:  ? PT End of Session - 12/09/21 1408   ? ? Visit Number 2   ? Number of Visits 17   ? Date for PT Re-Evaluation 01/26/22   ? Authorization Type UHC MC   ? Progress Note Due on Visit 10   ? PT Start Time 1407   Previous pt session ran late  ? PT Stop Time 5638   Session ended early due to pt request  ? PT Time Calculation (min) 28 min   ? Equipment Utilized During Treatment Gait belt   ? Activity Tolerance Patient tolerated treatment well   ? Behavior During Therapy [facility] for tasks assessed/performed   ? ?  ?  ? ?  ? ? ?Past Medical History:  ?Diagnosis Date  ? Ankle fracture   ? Left  ? Anxiety   ? Carotid artery occlusion   ? Closed fracture of left distal fibula 09/16/2017  ? Complication of anesthesia   ? ALLERY TO ESTER BASE  ? Depression   ? early 65s  ? Diabetes mellitus   ? INSULIN DEPENDENT  ? GERD (gastroesophageal reflux disease)   ? Headache   ? years ago  ? Hypertension   ? Pneumonia   ? Stroke [facility]) March 2015  ?  left MCA infarct, slight weakness on left side  ? Thyroid disease   ? ?Past Surgical History:  ?Procedure Laterality Date  ? ABDOMINAL HYSTERECTOMY    ? ANTERIOR FIXATION AND POSTERIOR MICRODISCECTOMY CERVICAL SPINE  1999  ? CAROTID ENDARTERECTOMY Left 11-04-13  ? cea  ? ENDARTERECTOMY Left 11/04/2013   ? IR ANGIO INTRA EXTRACRAN SEL COM CAROTID INNOMINATE BILAT MOD SED  11/16/2021  ? IR ANGIO INTRA EXTRACRAN SEL COM CAROTID INNOMINATE UNI L MOD SED  11/19/2021  ? IR ANGIO VERTEBRAL SEL VERTEBRAL BILAT MOD SED  11/16/2021  ? IR CT HEAD LTD  11/19/2021  ? IR INTRAVSC STENT CERV CAROTID W/EMB-PROT MOD SED INCL ANGIO  11/19/2021  ? ORIF ANKLE FRACTURE Left 09/16/2017  ? Procedure: OPEN REDUCTION INTERNAL FIXATION (ORIF) LEFT ANKLE FRACTURE;  Surgeon: Marchia Bond, MD;  Location: [place];  Service: Orthopedics;  Laterality: Left;  ? RADIOLOGY WITH ANESTHESIA N/A 11/19/2021  ? Procedure: Left carotid angioplasty with possible stenting;  Surgeon: Luanne Bras, MD;  Location: [place];  Service: Radiology;  Laterality: N/A;  ? ?Patient Active Problem List  ? Diagnosis Date Noted  ? Deficiency anemia 11/26/2021  ? Multinodular goiter 11/02/2021  ? Encounter for general adult medical examination with abnormal findings 04/08/2021  ? Stage 3b chronic kidney disease ([place]) 04/02/2020  ? Visit for screening mammogram 04/01/2020  ? Type 2 diabetes mellitus with complication, with long-term current use of insulin ([place]) 09/18/2018  ? Age-related osteoporosis with current pathological fracture 02/20/2018  ? Intractable migraine without  aura and with status migrainosus 11/24/2016  ? GERD (gastroesophageal reflux disease) 03/08/2016  ? Major depressive disorder, recurrent episode ([place]) 02/20/2016  ? Occlusion and stenosis of carotid artery with cerebral infarction 11/25/2013  ? Cerebrovascular accident (CVA) due to stenosis of left middle cerebral artery ([place]) 11/02/2013  ? Vitamin D deficiency 10/19/2013  ? Tobacco abuse 07/31/2013  ? Essential hypertension, benign 04/20/2013  ? Type II diabetes mellitus with manifestations ([place]) 04/20/2013  ? Hypothyroidism 04/20/2013  ? Hyperlipidemia with target LDL less than 70 04/20/2013  ? ? ?REFERRING DIAG: I95.188 (ICD-10-CM) - Cerebrovascular accident (CVA) due to stenosis of left middle cerebral  artery ([place])   ? ?THERAPY DIAG:  ?Unsteadiness on feet ? ?PERTINENT HISTORY: PMH: DM, L MCA 10/2010, thyroid disease, PSH: 10/2013 carotid endarterectomy  ? ?PRECAUTIONS: Fall ? ?SUBJECTIVE: Pt reports her greatest concern are her bilat hand tremors and handwriting and intended on asking for OT referral, not PT. Does not feel as though she needs PT at this time. Pt has not been doing her exercises.  ? ?PAIN:  ?Are you having pain? No ? ? ?OBJECTIVE: (objective measures completed at initial evaluation unless otherwise dated) ? ?  ?DIAGNOSTIC FINDINGS: MRI and CT negative for stroke. MRA showed 70% stenosis of proximal cervical ICA  ?  ?COGNITION: ?Overall cognitive status: Impaired: Memory: Deficits short term memory per pt report ? ?  ?  ?TODAY'S TREATMENT:  ? [facility] PT Assessment - 12/09/21 1416   ? ?  ? 6 Minute Walk- Baseline  ? 6 Minute Walk- Baseline yes   ? BP (mmHg) 131/50   ? HR (bpm) 90   ? Perceived Rate of Exertion (Borg) 6-   ?  ? 6 Minute walk- Post Test  ? 6 Minute Walk Post Test yes   ? BP (mmHg) 157/56   ? HR (bpm) 117   ? Perceived Rate of Exertion (Borg) 13- Somewhat hard   ?  ? 6 minute walk test results   ? Aerobic Endurance Distance [id number]   ? ?  ?  ? ?  ? ?6MWT: no AD used. Noted decreased arm swing bilaterally, foot flat bilaterally, narrow BOS, and lateral deviations to L side. No LOB noted throughout.  ? ?  ?PATIENT EDUCATION: ?Education details: 6MWT assessment, role of PT vs OT, obtaining vestibular PT referral in future if balance declines  ?Person educated: Patient ?Education method: Explanation ?Education comprehension: verbalized understanding ?  ?  ?HOME EXERCISE PROGRAM: ?Access Code [phone] ?  ?  ?  ?GOALS: ?Goals reviewed with patient? Yes ?  ?SHORT TERM GOALS: Target date: 12/29/2021 ?  ?Patient will demonstrate proper arm swings with walking to improve balance with walking without any verbal cues ?Baseline: Education initiated 12/01/21 ?Goal status: DC ?  ?2.  Patient will be  compliant with HEP to self manage her symptoms ?Baseline: HEP initiated 12/01/21 ?Goal status: DC ?  ?3.  Patient will demo modified CTSIB score of >105/120 to improve functional standing balance and proprioception  ?Baseline: 95/120-12/01/21 ?Goal status: DC ?  ?  ?LONG TERM GOALS: Target date: 01/26/2022 ?  ?Pt will demo 120/120 on modified CTSIB to improve proprioception and standing balance. ?Baseline: 95/120-12/01/21 ?Goal status: DC ?  ?2.  Pt will demo 28/30 or better on FGA to improve functional balance with gait. ?Baseline: 25/30 ?Goal status: DC ?  ?  ?ASSESSMENT: ?  ?CLINICAL IMPRESSION: ?Emphasis of skilled PT session on gait assessment and DC plan. Pt reports she intended on asking  for OT referral, not PT, to address poor handwriting and bilateral hand tremors. Pt states she does not want PT at this time and does not feel as though she needs it. Pt to be discharged from PT today and therapist will contact physician to obtain OT referral. Pt in agreement to discharge from PT today and understands to obtain new referral for vestibular PT in future if her balance changes.  ?  ?  ?OBJECTIVE IMPAIRMENTS Abnormal gait, decreased activity tolerance, decreased balance, decreased endurance, difficulty walking, decreased strength, decreased safety awareness, postural dysfunction, and pain.  ?  ?ACTIVITY LIMITATIONS cleaning, community activity, meal prep, laundry, and yard work.  ?  ?PERSONAL FACTORS Time since onset of injury/illness/exacerbation are also affecting patient's functional outcome.  ?  ?  ?REHAB POTENTIAL: Good ?  ?CLINICAL DECISION MAKING: Stable/uncomplicated ?  ?EVALUATION COMPLEXITY: Low ?  ?PLAN: ?PT FREQUENCY: 2x/week ?  ?PT DURATION: 8 weeks ?  ?PLANNED INTERVENTIONS: Therapeutic exercises, Therapeutic activity, Neuromuscular re-education, Balance training, Gait training, Patient/Family education, Joint mobilization, Orthotic/Fit training, Cryotherapy, Moist heat, and Manual therapy ? ? ? ?Cruzita Lederer Evamaria Detore, PT, DPT ?12/09/2021, 2:43 PM ? ?   ?

## 2021-12-10 ENCOUNTER — Ambulatory Visit (HOSPITAL_COMMUNITY)
Admission: RE | Admit: 2021-12-10 | Discharge: 2021-12-10 | Disposition: A | Discharge status: Home / Self Care | Payer: Medicare Other | Source: Ambulatory Visit | Attending: Radiology | Admitting: Radiology

## 2021-12-10 ENCOUNTER — Telehealth (HOSPITAL_COMMUNITY): Payer: Medicare Other | Admitting: Psychiatry

## 2021-12-10 DIAGNOSIS — R479 Unspecified speech disturbances: Secondary | ICD-10-CM | POA: Diagnosis not present

## 2021-12-10 DIAGNOSIS — I6522 Occlusion and stenosis of left carotid artery: Secondary | ICD-10-CM

## 2021-12-11 ENCOUNTER — Other Ambulatory Visit: Payer: Self-pay | Admitting: Internal Medicine

## 2021-12-11 ENCOUNTER — Ambulatory Visit: Payer: Medicare Other | Admitting: Physical Therapy

## 2021-12-11 ENCOUNTER — Ambulatory Visit: Payer: Medicare Other | Admitting: Speech Pathology

## 2021-12-11 DIAGNOSIS — I63512 Cerebral infarction due to unspecified occlusion or stenosis of left middle cerebral artery: Secondary | ICD-10-CM

## 2021-12-13 HISTORY — PX: IR RADIOLOGIST EVAL & MGMT: IMG5224

## 2021-12-14 ENCOUNTER — Other Ambulatory Visit: Payer: Self-pay | Admitting: Internal Medicine

## 2021-12-14 ENCOUNTER — Telehealth (HOSPITAL_BASED_OUTPATIENT_CLINIC_OR_DEPARTMENT_OTHER): Payer: Medicare Other | Admitting: Psychiatry

## 2021-12-14 ENCOUNTER — Encounter (HOSPITAL_COMMUNITY): Payer: Self-pay | Admitting: Psychiatry

## 2021-12-14 DIAGNOSIS — F331 Major depressive disorder, recurrent, moderate: Secondary | ICD-10-CM

## 2021-12-14 DIAGNOSIS — E039 Hypothyroidism, unspecified: Secondary | ICD-10-CM

## 2021-12-14 DIAGNOSIS — F411 Generalized anxiety disorder: Secondary | ICD-10-CM | POA: Diagnosis not present

## 2021-12-14 MED ORDER — AMITRIPTYLINE HCL 100 MG PO TABS: 100.0000 mg | ORAL_TABLET | Freq: Every day | ORAL | 0 refills | Status: DC

## 2021-12-14 MED ORDER — BUPROPION HCL ER (XL) 300 MG PO TB24: 300.0000 mg | ORAL_TABLET | Freq: Every day | ORAL | 0 refills | Status: DC

## 2021-12-14 NOTE — Progress Notes (Signed)
Virtual Visit via Telephone Note ? ?I connected with Sonia Skinner on 12/14/21 at  3:00 PM EDT by telephone and verified that I am speaking with the correct person using two identifiers. ? ?Location: ?Patient: Home ?Provider: Home Office ?  ?I discussed the limitations, risks, security and privacy concerns of performing an evaluation and management service by telephone and the availability of in person appointments. I also discussed with the patient that there may be a patient responsible charge related to this service. The patient expressed understanding and agreed to proceed. ? ? ?History of Present Illness: ?Patient is evaluated by phone session.  She was admitted to the hospital from March 31 to April 7 for stroke.  She has left-sided weakness.  She had a history of TIA.  She struggle with fatigue, lack of energy, attention and concentration.  She is now in physical therapy and speech therapy.  Her primary care physician is working to help her better control her blood sugar.  Her last hemoglobin A1c was 7.8.  She admitted feeling nervous and anxious about her general health but denies any crying spells or any feeling of hopelessness.  She lives with her sister who does prepare meals for her.  She is not allow to drive at this time.  Her appetite is okay.  She denies any hallucination or any paranoia.  She like to keep the Wellbutrin and amitriptyline which is helping her depression and anxiety. ? ?Past Psychiatric History: Reviewed. ?H/O taking antidepressant on and off most of life. H/O overdose and inpatient at [place] provider at [facility] and given the Strattera, Adderall, Zoloft, Celexa, Lamictal and Lexapro.  Never tested for ADHD. No h/o psychosis, mania or hallucination.  In 2001 she moved to [facility] and saw psychiatrist and given amitriptyline and Wellbutrin to stop smoking.  ?  ?Psychiatric Specialty Exam: ?Physical Exam  ?Review of Systems  ?Neurological:  Positive for  numbness.  ?     Weakness and tingling on left side.    ?Weight 124 lb (56.2 kg).There is no height or weight on file to calculate BMI.  ?General Appearance: NA  ?Eye Contact:  NA  ?Speech:  Slow  ?Volume:  Decreased  ?Mood:  Dysphoric  ?Affect:  NA  ?Thought Process:  Descriptions of Associations: Intact  ?Orientation:  Full (Time, Place, and Person)  ?Thought Content:  Rumination  ?Suicidal Thoughts:  No  ?Homicidal Thoughts:  No  ?Memory:  Immediate;   Fair ?Recent;   Fair ?Remote;   Fair  ?Judgement:  Fair  ?Insight:  Shallow  ?Psychomotor Activity:  NA  ?Concentration:  Concentration: Fair and Attention Span: Fair  ?Recall:  Fair  ?Fund of Knowledge:  Fair  ?Language:  Fair  ?Akathisia:  No  ?Handed:  Right  ?AIMS (if indicated):     ?Assets:  Communication Skills ?Desire for Improvement ?Housing ?Social Support  ?ADL's:  Intact  ?Cognition:  Impaired,  Mild  ?Sleep:   Fair  ? ? ? ? ?Assessment and Plan: ?Major depressive disorder, recurrent.  Generalized anxiety disorder. ? ?I review discharge summary, current medication, blood work results and notes from other provider.  Patient is disappointed and sad about her health but also feels that her current medicine working and helping her anxiety and depression.  She has more sessions for speech therapy and physical therapy.  So far she has no concern from the medication.  She has good support from her sister.  I will continue Wellbutrin XL  300 mg daily and amitriptyline 100 mg at bedtime.  Discussed underlying memory issues with decreased attention and concentration due to organic cause.  Recommend to see neurologist for further follow-up and work-up.  I will also send my note to her PCP.  Recommended to call us back if she is any question or any concern.  Follow-up in 3 months. ? ?Follow Up Instructions: ? ?  ?I discussed the assessment and treatment plan with the patient. The patient was provided an opportunity to ask questions and all were answered. The patient  agreed with the plan and demonstrated an understanding of the instructions. ?  ?The patient was advised to call back or seek an in-person evaluation if the symptoms worsen or if the condition fails to improve as anticipated. ? ?Collaboration of Care: Other provider involved in patient's care AEB notes are available in epic to review. ? ?Patient/Guardian was advised Release of Information must be obtained prior to any record release in order to collaborate their care with an outside provider. Patient/Guardian was advised if they have not already done so to contact the registration department to sign all necessary forms in order for [place] to release information regarding their care.  ? ?Consent: Patient/Guardian gives verbal consent for treatment and assignment of benefits for services provided during this visit. Patient/Guardian expressed understanding and agreed to proceed.   ? ?I provided 21 minutes of non-face-to-face time during this encounter. ? ? ?Kathlee Nations, MD  ?

## 2021-12-15 ENCOUNTER — Encounter: Payer: Self-pay | Admitting: Occupational Therapy

## 2021-12-15 ENCOUNTER — Ambulatory Visit: Payer: Medicare Other | Admitting: Occupational Therapy

## 2021-12-15 ENCOUNTER — Ambulatory Visit: Payer: Medicare Other | Attending: Internal Medicine | Admitting: Speech Pathology

## 2021-12-15 ENCOUNTER — Ambulatory Visit: Payer: Medicare Other | Admitting: Physical Therapy

## 2021-12-15 DIAGNOSIS — R41841 Cognitive communication deficit: Secondary | ICD-10-CM | POA: Diagnosis present

## 2021-12-15 DIAGNOSIS — R278 Other lack of coordination: Secondary | ICD-10-CM | POA: Diagnosis not present

## 2021-12-15 DIAGNOSIS — R2681 Unsteadiness on feet: Secondary | ICD-10-CM | POA: Diagnosis not present

## 2021-12-15 NOTE — Addendum Note (Signed)
Addended by: Mariah Milling on: 12/15/2021 05:23 PM ? ? Modules accepted: Orders ? ?

## 2021-12-15 NOTE — Patient Instructions (Addendum)
Friday, bring meds and med box so we can work out system for sorting your medications ? ? ?Morning routine- I want you trying to implement doing this on your own. Use this chart to remind you of your tasks and mark off what you've done. I would like for Sonia Skinner to still check that you've done it, but give you enough time to be independent if able.  ? ? ? Wednesday Thursday Friday  ?Test blood sugar     ?Test blood pressure     ?Take insulin     ?Take AM meds     ?Eat breakfast     ? ?

## 2021-12-15 NOTE — Therapy (Signed)
?OUTPATIENT SPEECH LANGUAGE PATHOLOGY TREATMENT NOTE ? ? ?Patient Name: Sonia Skinner ?MRN: [id number] ?DOB:[birth year], 71 y.o., female ?Today's Date: 12/15/2021 ? ?PCP: [name], MD ?REFERRING PROVIDER: [name], MD ? ?END OF SESSION:  ? End of Session - 12/15/21 1451   ? ? Visit Number 3   ? Number of Visits 17   ? Date for SLP Re-Evaluation 01/26/22   ? Authorization Type UHC Medicare   ? Progress Note Due on Visit 10   ? SLP Start Time 1452   pt arrived late to session  ? SLP Stop Time  1530   ? SLP Time Calculation (min) 38 min   ? Activity Tolerance Patient tolerated treatment well   ? ?  ?  ? ?  ? ? ?Past Medical History:  ?Diagnosis Date  ? Ankle fracture   ? Left  ? Anxiety   ? Carotid artery occlusion   ? Closed fracture of left distal fibula 09/16/2017  ? Complication of anesthesia   ? ALLERY TO ESTER BASE  ? Depression   ? early 5s  ? Diabetes mellitus   ? INSULIN DEPENDENT  ? GERD (gastroesophageal reflux disease)   ? Headache   ? years ago  ? Hypertension   ? Pneumonia   ? Stroke [facility]) March 2015  ?  left MCA infarct, slight weakness on left side  ? Thyroid disease   ? ?Past Surgical History:  ?Procedure Laterality Date  ? ABDOMINAL HYSTERECTOMY    ? ANTERIOR FIXATION AND POSTERIOR MICRODISCECTOMY CERVICAL SPINE  1999  ? CAROTID ENDARTERECTOMY Left 11-04-13  ? cea  ? ENDARTERECTOMY Left 11/04/2013  ? IR ANGIO INTRA EXTRACRAN SEL COM CAROTID INNOMINATE BILAT MOD SED  11/16/2021  ? IR ANGIO INTRA EXTRACRAN SEL COM CAROTID INNOMINATE UNI L MOD SED  11/19/2021  ? IR ANGIO VERTEBRAL SEL VERTEBRAL BILAT MOD SED  11/16/2021  ? IR CT HEAD LTD  11/19/2021  ? IR INTRAVSC STENT CERV CAROTID W/EMB-PROT MOD SED INCL ANGIO  11/19/2021  ? IR RADIOLOGIST EVAL & MGMT  12/13/2021  ? ORIF ANKLE FRACTURE Left 09/16/2017  ? Procedure: OPEN REDUCTION INTERNAL FIXATION (ORIF) LEFT ANKLE FRACTURE;  Surgeon: Marchia Bond, MD;  Location: [place];  Service: Orthopedics;  Laterality: Left;  ? RADIOLOGY WITH ANESTHESIA N/A 11/19/2021  ?  Procedure: Left carotid angioplasty with possible stenting;  Surgeon: Luanne Bras, MD;  Location: [place];  Service: Radiology;  Laterality: N/A;  ? ?Patient Active Problem List  ? Diagnosis Date Noted  ? Deficiency anemia 11/26/2021  ? Multinodular goiter 11/02/2021  ? Encounter for general adult medical examination with abnormal findings 04/08/2021  ? Stage 3b chronic kidney disease ([place]) 04/02/2020  ? Visit for screening mammogram 04/01/2020  ? Type 2 diabetes mellitus with complication, with long-term current use of insulin ([place]) 09/18/2018  ? Age-related osteoporosis with current pathological fracture 02/20/2018  ? Intractable migraine without aura and with status migrainosus 11/24/2016  ? GERD (gastroesophageal reflux disease) 03/08/2016  ? Major depressive disorder, recurrent episode ([place]) 02/20/2016  ? Occlusion and stenosis of carotid artery with cerebral infarction 11/25/2013  ? Cerebrovascular accident (CVA) due to stenosis of left middle cerebral artery ([place]) 11/02/2013  ? Vitamin D deficiency 10/19/2013  ? Tobacco abuse 07/31/2013  ? Essential hypertension, benign 04/20/2013  ? Type II diabetes mellitus with manifestations ([place]) 04/20/2013  ? Hypothyroidism 04/20/2013  ? Hyperlipidemia with target LDL less than 70 04/20/2013  ? ? ?ONSET DATE: 11/13/2021 ? ?REFERRING DIAG: B01.751 (  ICD-10-CM) - Cerebrovascular accident (CVA) due to stenosis of left middle cerebral artery ([place])  ? ?THERAPY DIAG:  ?Cognitive communication deficit ? ?SUBJECTIVE: "let's keep it as it is" re: schedule ? ?PAIN:  ?Are you having pain? No ? ? ?OBJECTIVE:  ? ?TODAY'S TREATMENT:  ?12-15-21: ST assisted pt in developing support to recall important components of morning routine to include taking blood sugar and blood pressure measurements. Pt reports sister is directing this currently, wishes to regain independence. Pt to implement at home with distant supervision from sister. Target word finding through SFA. Pt able to describe  x1 action and x1 noun using SFA frame work with occasional min-A.  ? ?12-09-21: Target verbal expression compensations d/t pt c/o anomia in conversations. Education on strategies of using written aid when able, rehearsing for telephone calls. No overt word-finding difficulties observed throughout conversational speech sample of about 20 minutes. Generate medication management strategies. Pt to buy pill box and bring that and meds to following appointment.  ?  ?  ?PATIENT EDUCATION: ?Education details: aee above ?Person educated: Patient ?Education method: Explanation, Demonstration, and Handouts ?Education comprehension: verbalized understanding, returned demonstration, and needs further education ? ?HOME EDUCATION PROGRAM:  ? SFA, morning routine ?  ?  ?GOALS: ?Goals reviewed with patient? Yes ?  ?SHORT TERM GOALS: Target date: 12/30/2021 ?  ?Pt will accurately implement medication management system with mod I over 2 sessions.  ?Baseline: ?Goal status: ongoing ?  ?2.  Pt will teach back x3 attention and memory compensations/strategies, and ID functional use for at home, with occasional min-A ?Baseline:  ?Goal status: ongoing ?  ?3.  Pt will verbalize x2 anomia compensations she can implement to reduce feelings of frustration when word finding difficulty occurs with occasional min-A ?Baseline:  ?Goal status: ongoing ?  ?LONG TERM GOALS: Target date: 02/24/2022 ?  ?Pt will report improved cognitive linguistic functioning via PROM by 2 points at last ST session ?Baseline:  ?Goal status: ongoing ?  ?2.  Pt will report successful implementation of medication management system with no missed doses over 1 week period  ?Baseline:  ?Goal status: ongoing ?  ?3.  Pt will use appropriate external aids for recall and management of finances, schedules, appointments, to-do's with occasional min A over 1 week ?Baseline:  ?Goal status: ongoing ?  ?4.  Pt will demonstrates [facility] verbal expression during mod-complex conversation of at  least 15 minutes ?Baseline:  ?Goal status: ongoing ?  ?  ?ASSESSMENT: ?  ?CLINICAL IMPRESSION: ?Patient is a 71 y.o. F who was seen today for cognitive communication changes s/p left ICA stent placement. Pt reports that in Dec 2022, she began to notice changes in her thinking. She went to hospital and was d/c. Returned in March 2023 and was found to have blockage in L ICA, had stent placed. It is unclear if this is in relation to cognitive changes experienced by pt. Pt currently requiring moderate amount of assistance from sister in managing iADLs such as financial management, medication management, cooking, shopping, schedule management. CLQT does not appear to fully reflect the functional deficits pt is experiencing at home. Deficits appear to be impacting pt's QoL d/t increased anxiety around difficulties, decreased participation in meaningful activities, and reliance on sister for assistance. ?  ?First change pt noticed was inability to recall names of students with whom she was familiar, previous had no difficulty. Has attempted to implement medication management system, reportedly still having difficulties. Tells ST, "I can't remember if I have taken  them or not." Pt is currently managing finances but again questions completeness of tasks and requires double checking to ensure bills are paid. Energy and motivation factors impacting ability to complete daily to-do list. Organizational issues, tells ST "my room is a disaster and I can't seem to get it organized." Is not currently grocery shopping or cooking, relying on sister. Pt also reports 3-4x/week occurences of feeling "tongue tied." Endorses knowing what she'd like to say but is unable to "get it out." Reports feels of tongue swelling which sometimes, yet not always, coincides with expressive verbal impairment. X1 occurrence of word finding difficulties observed this date.  ?  ?I recommend skilled ST to address functional impairments 2/2 cognitive  communication deficits in areas of memory, attention, executive functioning. Additionally will provide pt with anomia strategies for when reported aphasia-like events occur.  ?  ?OBJECTIVE IMPAIRMENTS include atten

## 2021-12-15 NOTE — Therapy (Signed)
?OUTPATIENT OCCUPATIONAL THERAPY NEURO EVALUATION ? ?Patient Name: Sonia Skinner ?MRN: [id number] ?DOB:26-Jan-1951, 71 y.o., female ?Today's Date: 12/15/2021 ? ?PCP: Sonia Lima, MD  ?REFERRING PROVIDER: [name], MD  ? ? OT End of Session - 12/15/21 1720   ? ? Visit Number 1   ? Number of Visits 1   ? Authorization Type [employer]   ? OT Start Time 1530   ? OT Stop Time 1615   ? OT Time Calculation (min) 45 min   ? Activity Tolerance Patient tolerated treatment well   ? Behavior During Therapy [facility] for tasks assessed/performed   ? ?  ?  ? ?  ? ? ?Past Medical History:  ?Diagnosis Date  ? Ankle fracture   ? Left  ? Anxiety   ? Carotid artery occlusion   ? Closed fracture of left distal fibula 09/16/2017  ? Complication of anesthesia   ? ALLERY TO ESTER BASE  ? Depression   ? early 86s  ? Diabetes mellitus   ? INSULIN DEPENDENT  ? GERD (gastroesophageal reflux disease)   ? Headache   ? years ago  ? Hypertension   ? Pneumonia   ? Stroke [facility]) March 2015  ?  left MCA infarct, slight weakness on left side  ? Thyroid disease   ? ?Past Surgical History:  ?Procedure Laterality Date  ? ABDOMINAL HYSTERECTOMY    ? ANTERIOR FIXATION AND POSTERIOR MICRODISCECTOMY CERVICAL SPINE  1999  ? CAROTID ENDARTERECTOMY Left 11-04-13  ? cea  ? ENDARTERECTOMY Left 11/04/2013  ? IR ANGIO INTRA EXTRACRAN SEL COM CAROTID INNOMINATE BILAT MOD SED  11/16/2021  ? IR ANGIO INTRA EXTRACRAN SEL COM CAROTID INNOMINATE UNI L MOD SED  11/19/2021  ? IR ANGIO VERTEBRAL SEL VERTEBRAL BILAT MOD SED  11/16/2021  ? IR CT HEAD LTD  11/19/2021  ? IR INTRAVSC STENT CERV CAROTID W/EMB-PROT MOD SED INCL ANGIO  11/19/2021  ? IR RADIOLOGIST EVAL & MGMT  12/13/2021  ? ORIF ANKLE FRACTURE Left 09/16/2017  ? Procedure: OPEN REDUCTION INTERNAL FIXATION (ORIF) LEFT ANKLE FRACTURE;  Surgeon: Sonia Bond, MD;  Location: [place];  Service: Orthopedics;  Laterality: Left;  ? RADIOLOGY WITH ANESTHESIA N/A 11/19/2021  ? Procedure: Left carotid angioplasty with possible stenting;   Surgeon: Sonia Bras, MD;  Location: [place];  Service: Radiology;  Laterality: N/A;  ? ?Patient Active Problem List  ? Diagnosis Date Noted  ? Deficiency anemia 11/26/2021  ? Multinodular goiter 11/02/2021  ? Encounter for general adult medical examination with abnormal findings 04/08/2021  ? Stage 3b chronic kidney disease ([place]) 04/02/2020  ? Visit for screening mammogram 04/01/2020  ? Type 2 diabetes mellitus with complication, with long-term current use of insulin ([place]) 09/18/2018  ? Age-related osteoporosis with current pathological fracture 02/20/2018  ? Intractable migraine without aura and with status migrainosus 11/24/2016  ? GERD (gastroesophageal reflux disease) 03/08/2016  ? Major depressive disorder, recurrent episode ([place]) 02/20/2016  ? Occlusion and stenosis of carotid artery with cerebral infarction 11/25/2013  ? Cerebrovascular accident (CVA) due to stenosis of left middle cerebral artery ([place]) 11/02/2013  ? Vitamin D deficiency 10/19/2013  ? Tobacco abuse 07/31/2013  ? Essential hypertension, benign 04/20/2013  ? Type II diabetes mellitus with manifestations ([place]) 04/20/2013  ? Hypothyroidism 04/20/2013  ? Hyperlipidemia with target LDL less than 70 04/20/2013  ? ? ?ONSET DATE: 04/23 ? ?REFERRING DIAG: X54.008 (ICD-10-CM) - Cerebrovascular accident (CVA) due to stenosis of left middle cerebral artery ([place])  ? ?THERAPY  DIAG:  ?Other lack of coordination ? ?Unsteadiness on feet ? ?SUBJECTIVE:  ? ?SUBJECTIVE STATEMENT: ?Improve clarity of her handwriting ?Pt accompanied by: self ? ?PERTINENT HISTORY: Sonia Skinner is a 71 year old female with past medical history significant for type 2 diabetes mellitus, essential hypertension, ICA stenosis s/p left carotid endarterectomy 2015, CKD stage IIIb, GERD, history of CVA. Presented to ED on 3/31 with a visual changes described as "vision white out", feeling of tongue thickening, gait disturbance and speech impairment. Speech impairment reported lasting  about 30 minutes.  Additionally with fatigue and dizziness which prompted her to seek care in the ED. In the ED, CT head without contrast with no acute intracranial findings, noted old infarct left parietal cortex unchanged. Neurology was consulted.  Patient was transferred to [facility] for further stroke versus TIA work-up. On admission, MR brain no acute infarct, chronic left MCA infarct.  MRA head/neck 70% stenosis proximal cervical left ICA. She underwent  EEG with no seizures or epileptiform discharges. She underwent cerebral angiograph with left ICA stent placement on 11/19/21 which was well tolerated.    ? ?PRECAUTIONS: Fall ? ?WEIGHT BEARING RESTRICTIONS No ? ?PAIN:  ?Are you having pain? No ? ?FALLS: Has patient fallen in last 6 months? No ? ?LIVING ENVIRONMENT: ?Lives with: lives with their family ?Lives in: House/apartment ? ? ?PLOF: Independent with basic ADLs was working full time as Oceanographer ? ?PATIENT GOALS Improve handwriting, return to work/driving ? ?OBJECTIVE:  ? ?HAND DOMINANCE: Left ? ?ADLs: ?Overall ADLs: modified independence ?Transfers/ambulation related to ADLs: walks without assistive device ?Eating: Independent ?Grooming: Independent ?UB Dressing: Independent ?LB Dressing: Independent ?Toileting: Independent ?Bathing: Independent ?Tub Shower transfers: Tub shower has shower bench ?Equipment: Transfer tub bench ? ? ?IADLs: ?Shopping: Has not attempted ?Light housekeeping: Has not attempted ?Meal Prep: Has not attempted ?Community mobility: Has not attempted - has not yet been cleared to drive ?Medication management: Independent ?Financial management: Independent ?Handwriting: 100% legible ? ? ? ?POSTURE COMMENTS:  ?No Significant postural limitations ? ? ?ACTIVITY TOLERANCE: ?Activity tolerance: Reports less energy than prior ? ? ? ?UE ROM    ? ?Active ROM Right ?12/15/2021 Left ?12/15/2021  ?Shoulder flexion [facility] WFL  ?Shoulder abduction throughout throughout  ?Shoulder  adduction    ?Shoulder extension    ?Shoulder internal rotation    ?Shoulder external rotation    ?Elbow flexion    ?Elbow extension    ?Wrist flexion    ?Wrist extension    ?Wrist ulnar deviation    ?Wrist radial deviation    ?Wrist pronation    ?Wrist supination    ?(Blank rows = not tested) ? ? ?UE MMT:    ? ?MMT Right ?12/15/2021 Left ?12/15/2021  ?Shoulder flexion 4/5 4/5  ?Shoulder abduction throughout throughout  ?Shoulder adduction    ?Shoulder extension    ?Shoulder internal rotation    ?Shoulder external rotation    ?Middle trapezius    ?Lower trapezius    ?Elbow flexion    ?Elbow extension    ?Wrist flexion    ?Wrist extension    ?Wrist ulnar deviation    ?Wrist radial deviation    ?Wrist pronation    ?Wrist supination    ?(Blank rows = not tested) ? ?HAND FUNCTION: ?Grip strength: Right: 43.6 lbs; Left: 39.4 lbs and Lateral pinch: Right: 12 lbs, Left: 12 lbs ? ?COORDINATION: ?9 Hole Peg test: Right: 27.19 sec; Left: 26.53 sec ? ?SENSATION: ?WFL ? ? ?MUSCLE TONE: RUE: Within functional limits  and LUE: Within functional limits ? ?COGNITION: ?Overall cognitive status: Impaired: Attention: Impaired: Selective, Memory: Impaired: Short term, and Executive function: Impaired: Initiation, Problem solving, Organization, Planning, and Slow processing ? ?VISION: ?Subjective report: Light and dark sensitivity ?Baseline vision: Wears glasses for reading only ?Visual history: cataracts surgically addressed ? ?VISION ASSESSMENT: ?WFL ? ?Patient has difficulty with following activities due to following visual impairments: NA ? ?PERCEPTION: WFL ? ?PRAXIS: WFL ? ? ?TODAY'S TREATMENT:  ?Discussed results of OT evaluation - and no need for further therapy as many of her issues have recently resolved.   ? ? ?PATIENT EDUCATION: ?Education details: results of eval ?Person educated: Patient ?Education method: Explanation ?Education comprehension: verbalized understanding ? ? ?HOME EXERCISE PROGRAM: ?NA ? ? ? ?GOALS: ?Goals reviewed  with patient? No ? ? ?ASSESSMENT: ? ?CLINICAL IMPRESSION: ?Patient is a 71 y.o. female who was seen today for occupational therapy evaluation for h/o stroke, TIA.  ? ?PERFORMANCE DEFICITS in functional skills i

## 2021-12-16 ENCOUNTER — Ambulatory Visit: Payer: Medicare Other | Admitting: *Deleted

## 2021-12-16 DIAGNOSIS — I63512 Cerebral infarction due to unspecified occlusion or stenosis of left middle cerebral artery: Secondary | ICD-10-CM

## 2021-12-16 DIAGNOSIS — E118 Type 2 diabetes mellitus with unspecified complications: Secondary | ICD-10-CM

## 2021-12-16 NOTE — Chronic Care Management (AMB) (Signed)
? Care Management ?  ? RN Visit Note ? ?12/16/2021 ?Name: Sonia Skinner MRN: [id number] DOB: [birth year] ? ?Subjective: ?Sonia Skinner is a 71 y.o. year old female who is a primary care patient of Sonia Lima, MD. The care management team was consulted for assistance with disease management and care coordination needs.   ? ?Engaged with patient by telephone for initial visit in response to provider referral for case management and/or care coordination services.  ? ?Consent to Services:  ? Sonia Skinner was given information about Care Management services 11/24/21 including:  ?Care Management services includes personalized support from designated clinical staff supervised by her physician, including individualized plan of care and coordination with other care providers ?24/7 contact phone numbers for assistance for urgent and routine care needs. ?The patient may stop case management services at any time by phone call to the office staff. ? ?Patient agreed to services and consent obtained.  ? ?Assessment: Review of patient past medical history, allergies, medications, health status, including review of consultants reports, laboratory and other test data, was performed as part of comprehensive evaluation and provision of chronic care management services.  ? ?SDOH (Social Determinants of Health) assessments and interventions performed:  ?SDOH Interventions   ? ?Flowsheet Row Most Recent Value  ?SDOH Interventions   ?Food Insecurity Interventions Intervention Not Indicated  [Denies food insecurity,  does not receive any assistance with food acquisition]  ?Housing Interventions Intervention Not Indicated  [lives with sister in one level single family home, no steps, denies safety concerns]  ?Transportation Interventions Intervention Not Indicated  [sister currently providing transportation]  ?Depression Interventions/Treatment  Currently on Treatment  [Patient reports she is now followed/ established with psychiatry provider]  ? ?   ?Care Plan ? ?Allergies  ?Allergen Reactions  ? Anesthetics, [place] Anaphylaxis  ? ?Outpatient Encounter Medications as of 12/16/2021  ?Medication Sig  ? Accu-Chek FastClix Lancets MISC Use to check blood sugar five times daily.  ? acetaminophen (TYLENOL) 500 MG tablet Take 1,000-1,500 mg by mouth every 6 (six) hours as needed for mild pain.  ? amitriptyline (ELAVIL) 100 MG tablet Take 1 tablet (100 mg total) by mouth at bedtime.  ? amLODipine (NORVASC) 5 MG tablet Take 1 tablet (5 mg total) by mouth daily.  ? aspirin 81 MG chewable tablet Chew 1 tablet (81 mg total) by mouth daily.  ? atorvastatin (LIPITOR) 80 MG tablet Take 0.5 tablets (40 mg total) by mouth daily.  ? buPROPion (WELLBUTRIN XL) 300 MG 24 hr tablet Take 1 tablet (300 mg total) by mouth daily.  ? Cholecalciferol (VITAMIN D3 PO) Take 1 tablet by mouth daily. Unsure of dose  ? Continuous Blood Gluc Receiver (DEXCOM G7 RECEIVER) DEVI 1 Act by Does not apply route daily.  ? Continuous Blood Gluc Sensor (DEXCOM G7 SENSOR) MISC 1 Act by Does not apply route daily.  ? Continuous Blood Gluc Sensor (DEXCOM G7 SENSOR) MISC 1 Act by Does not apply route daily.  ? dexlansoprazole (DEXILANT) 60 MG capsule Take 1 capsule by mouth daily.  ? EDARBYCLOR 40-12.5 MG TABS Take 1 tablet by mouth daily.  ? EUTHYROX 88 MCG tablet Take 1 tablet by mouth once daily  ? ezetimibe (ZETIA) 10 MG tablet Take 1 tablet (10 mg total) by mouth daily.  ? FARXIGA 10 MG TABS tablet Take 1 tablet by mouth daily before breakfast.  ? gabapentin (NEURONTIN) 100 MG capsule Take 1 capsule (100 mg total) by mouth 3 (three) times daily. (Patient taking  differently: Take 100-200 mg by mouth See admin instructions. 200 mg int the morning ?100 mg at bedtime)  ? Insulin Glargine (BASAGLAR KWIKPEN [place]) Inject 30-50 Units into the skin daily. 30-35 units if below 150 ?50 units if over 150  ? insulin lispro (HUMALOG KWIKPEN) 200 UNIT/ML KwikPen Inject 5 Units into the skin 3 (three) times daily with  meals.  ? Insulin Pen Needle 32G X 4 MM MISC Use to administer insulin four times a day  ? loratadine (CLARITIN) 10 MG tablet Take 1 tablet (10 mg total) by mouth daily.  ? Semaglutide, 2 MG/DOSE, 8 MG/3ML SOPN Inject 2 mg as directed once a week. (Patient taking differently: Inject 2 mg as directed every 14 (fourteen) days.)  ? ticagrelor (BRILINTA) 90 MG TABS tablet Take 1 tablet (90 mg total) by mouth 2 (two) times daily.  ? ?No facility-administered encounter medications on file as of 12/16/2021.  ? ?Patient Active Problem List  ? Diagnosis Date Noted  ? Deficiency anemia 11/26/2021  ? Multinodular goiter 11/02/2021  ? Encounter for general adult medical examination with abnormal findings 04/08/2021  ? Stage 3b chronic kidney disease ([place]) 04/02/2020  ? Visit for screening mammogram 04/01/2020  ? Type 2 diabetes mellitus with complication, with long-term current use of insulin ([place]) 09/18/2018  ? Age-related osteoporosis with current pathological fracture 02/20/2018  ? Intractable migraine without aura and with status migrainosus 11/24/2016  ? GERD (gastroesophageal reflux disease) 03/08/2016  ? Major depressive disorder, recurrent episode ([place]) 02/20/2016  ? Occlusion and stenosis of carotid artery with cerebral infarction 11/25/2013  ? Cerebrovascular accident (CVA) due to stenosis of left middle cerebral artery ([place]) 11/02/2013  ? Vitamin D deficiency 10/19/2013  ? Tobacco abuse 07/31/2013  ? Essential hypertension, benign 04/20/2013  ? Type II diabetes mellitus with manifestations ([place]) 04/20/2013  ? Hypothyroidism 04/20/2013  ? Hyperlipidemia with target LDL less than 70 04/20/2013  ? ?Conditions to be addressed/monitored:   HTN and DMII ? ?Care Plan : RN Care Manager Plan of Care  ?Updates made by Knox Royalty, RN since 12/16/2021 12:00 AM  ?  ? ?Problem: Chronic Disease Management Needs   ?Priority: High  ?  ? ?Long-Range Goal: Development of Plan of Care for long term chronic disease management   ?Start  Date: 12/16/2021  ?Expected End Date: 12/17/2022  ?Priority: High  ?Note:   ?Current Barriers:  ?Chronic Disease Management support and education needs related to HTN and DMII ?Recent hospitalization 3/31--- 11/20/21 for TIA/ carotid stenosis- stent placed: discharged home to self care ? ?RNCM Clinical Goal(s):  ?Patient will demonstrate ongoing health management independence as evidenced by adherence to plan of care for self-health management of HTN/ DMII        through collaboration with RN Care manager, provider, and care team.  ? ?Interventions: ?1:1 collaboration with primary care provider regarding development and update of comprehensive plan of care as evidenced by provider attestation and co-signature ?Inter-disciplinary care team collaboration (see longitudinal plan of care) ?Evaluation of current treatment plan related to  self management and patient's adherence to plan as established by provider ?Review of patient status, including review of consultants reports, relevant laboratory and other test results, and medications completed ?SDOH assessment completed: no unmet concerns identified ?Depression screening completed: reports recent feelings of depression that "are much better" since she is now established with psychiatric provider, which was facilitated by Clinic CSW-- encouraged patient to maintain her visits/ communication with both CSW and psychiatry provider: she verbalizes understanding  and agreement ?Pain assessment completed: denies pain today; reports intermittent chronic low back pain, well controlled with tylenol and rest only ?Falls assessment completed: denies recent falls x 12 months- currently not using assistive devices;  positive reinforcement provided with encouragement to continue efforts at fall prevention; education around fall risks/ prevention provided ?Reviewed recent hospitalization: she confirms she believes she is recuperating well and she denies current clinical concerns; states,  "I am finally feeling so much better" ?Medications discussed: reports independently self-manages and denies current concerns/ issues/ questions around medications; endorses adherence to taking all medicatio

## 2021-12-16 NOTE — Patient Instructions (Signed)
Visit Information ? ?[name], thank you for taking time to talk with me today. Please don't hesitate to contact me if I can be of assistance to you before our next scheduled telephone appointment ? ?Below are the goals we discussed today:  ?Patient Self-Care Activities: ?Patient Sonia Skinner will: ?Take medications as prescribed ?Attend all scheduled provider appointments ?Call pharmacy for medication refills ?Call provider office for new concerns or questions ?Continue to check fasting (first thing in the morning, before eating) blood sugars at home; check your blood sugars periodically throughout the day using your continuous glucose monitor ?Ask your endocrinology provider to place a referral to the [place] if you would like to have training in the use of your continuous glucose monitor ?Ask your endocrinology provider to clarify the medications you are taking for diabetes ?Continue to follow heart healthy, low salt, low cholesterol, carbohydrate-modified, low sugar diet ?Continue to regularly monitor your blood pressures at home-- write these values down on paper so we can review them during our next telephone appointment ? ?Our next scheduled telephone follow up visit/ appointment is scheduled on:  Friday, January 15, 2022 at 2:15 pm- This is a PHONE [place] appointment ? ?If you need to cancel or re-schedule our visit, please call [phone] and our care guide team will be happy to assist you. ?  ?I look forward to hearing about your progress. ?  ?Oneta Rack, RN, BSN, CCRN Alumnus ?[place] ?[phone]: direct office ? ?If you are experiencing a Mental Health or [place] or need someone to talk to, please  ?call the Suicide and Crisis Lifeline: 988 ?call the [place] National Suicide Prevention Lifeline: 910-881-1588 or TTY: (608) 125-9486 TTY (610)482-9519) to talk to a trained counselor ?call 1-800-273-TALK (toll free, 24 hour  hotline) ?go to [facility] [address], [place] [phone]) ?call 911  ? ?Continuous Glucose Monitoring, Adult ? ?Continuous glucose monitoring (CGM) is a way to check your blood sugar (or blood glucose) level at any time. A CGM system includes a sensor that attaches to the skin of your belly or arm. The sensor reads the amount of glucose in the fluid between the cells under your skin. Every few minutes throughout the day and night, the sensor wirelessly sends signals to a glucose monitor that records and saves the readings. With most CGM systems, you also need to do a finger stick twice each day to make sure your finger stick reading matches the reading from your CGM device. CGM reduces the number of times you need to do a finger stick throughout the day. ?CGM lets you to see your glucose levels in real time. This information can help you make decisions about your diet, physical activity, and diabetes medicine. With most monitors, you also need to do a finger stick and check your blood sugar level using a regular glucose monitor before making any medicine decisions. ?Your health care provider may prescribe a CGM system if: ?You have type 1 diabetes. ?You have type 2 diabetes and use insulin. ?Your diabetes needs to be under tight control. ?You do not always have awareness of the warning symptoms of low blood sugar. ?You often have episodes of high blood sugar (hyperglycemia) or low blood sugar (hypoglycemia). ?Options for CGM systems ?There are several options for CGM systems. Some include monitors that: ?You carry or wear. ?Attach to and control an insulin pump. ?Send readings to your smartphone, tablet, or health  care provider. ?Set off an alarm if you have low blood sugar or high blood sugar. ?Work with your health care provider and diabetes educator to find the best option for you. Before you start using a CGM system at home, you will be trained in how to use it.  Using a CGM can help you improve the results of your A1C test, which shows your average blood sugar levels for the past 3 months. Using a CGM also can help you avoid hyperglycemia or hypoglycemia. Let your health care provider know if you do not understand how to use your CGM system or have questions. ?What are the risks? ?Generally, these devices are safe to use. However, it is possible that the insertion site may become irritated or infected. ?Tips for using a CGM system: ?Follow the instructions for your device carefully. Instructions are different for different systems. ?Make sure you understand how to place the CGM sensors and set up the monitor before you start using it at home. Most devices require that you replace the CGM sensor every 3 to 7 days, depending on the model. ?Work closely with your health care provider and diabetes educator to learn about your CGM and make sure you are using your CGM system safely and effectively. ?Do not take baths, swim, or use a hot tub unless your health care provider approves. CGM sensors are usually waterproof and can be left on in the bath or shower. CGM monitors are usually not waterproof. These should be kept dry. ?Follow these instructions at home: ?Follow your diabetes treatment plan to keep your blood sugar within your target range. ?Take action when the CGM alarm alerts you of high or low blood sugar levels. ?Check your CGM system insertion site every day for signs of infection. Check for: ?Redness, swelling, or pain. ?Fluid or blood. ?Warmth. ?Pus or a bad smell. ?Keep all follow-up visits as told by your health care provider. This is important. ?Where to find more information ?[employer], Continuous Glucose Monitoring: [email or web address] ?Contact a health care provider if: ?You are not sure how to use your CGM system or how to interpret the readings. ?You have finger sticks that do not match your monitor readings. ?Your CGM often alerts you of  hyperglycemia or hypoglycemia. ?You have a fever. ?Get help right away if: ?You have signs of infection at your insertion site. ?You have symptoms of hyperglycemia or hypoglycemia that do not get better with treatment. ?Summary ?Continuous glucose monitoring (CGM) is a way to check your blood sugar (or bloodglucose) level at any time. ?A CGM system includes a sensor that attaches to the skin of your belly or arm and a monitor to view the readings. ?CGM lets you to see your blood sugar levels in real time. This information can help you make decisions about your diet, physical activity, and diabetes medicine. ?Let your health care provider or diabetes educator know if you do not understand how to use your CGM system or have questions. ?This information is not intended to replace advice given to you by your health care provider. Make sure you discuss any questions you have with your health care provider. ?Document Revised: 08/31/2019 Document Reviewed: 08/31/2019 ?Elsevier Patient Education ? [place]. ? ?Following is a copy of your full plan of care:  ?Care Plan : [name] of Care  ?Updates made by Knox Royalty, RN since 12/16/2021 12:00 AM  ?  ? ?Problem: Chronic Disease Management Needs   ?  Priority: High  ?  ? ?Long-Range Goal: Development of Plan of Care for long term chronic disease management   ?Start Date: 12/16/2021  ?Expected End Date: 12/17/2022  ?Priority: High  ?Note:   ?Current Barriers:  ?Chronic Disease Management support and education needs related to HTN and DMII ?Recent hospitalization 3/31--- 11/20/21 for TIA/ carotid stenosis- stent placed: discharged home to self care ? ?RNCM Clinical Goal(s):  ?Patient will demonstrate ongoing health management independence as evidenced by adherence to plan of care for self-health management of HTN/ DMII        through collaboration with RN Care manager, provider, and care team.  ? ?Interventions: ?1:1 collaboration with primary care provider  regarding development and update of comprehensive plan of care as evidenced by provider attestation and co-signature ?Inter-disciplinary care team collaboration (see longitudinal plan of care) ?Evaluation of current treat

## 2021-12-17 NOTE — Therapy (Signed)
?OUTPATIENT SPEECH LANGUAGE PATHOLOGY TREATMENT NOTE ? ? ?Patient Name: Sonia Skinner ?MRN: [id number] ?DOB:1950-12-11, 71 y.o., female ?Today's Date: 12/18/2021 ? ?PCP: [name], MD ?REFERRING PROVIDER: [name], MD ? ?END OF SESSION:  ? End of Session - 12/18/21 1348   ? ? Visit Number 4   ? Number of Visits 17   ? Date for SLP Re-Evaluation 01/26/22   ? Authorization Type [employer] Medicare   ? Progress Note Due on Visit 10   ? SLP Start Time 1348   ? SLP Stop Time  1430   ? SLP Time Calculation (min) 42 min   ? Activity Tolerance Patient tolerated treatment well   ? ?  ?  ? ?  ? ? ? ?Past Medical History:  ?Diagnosis Date  ? Ankle fracture   ? Left  ? Anxiety   ? Carotid artery occlusion   ? Closed fracture of left distal fibula 09/16/2017  ? Complication of anesthesia   ? ALLERY TO ESTER BASE  ? Depression   ? early 52s  ? Diabetes mellitus   ? INSULIN DEPENDENT  ? GERD (gastroesophageal reflux disease)   ? Headache   ? years ago  ? Hypertension   ? Pneumonia   ? [facility]roke [facility]  ?  left MCA infarct, slight weakness on left side  ? Thyroid disease   ? ?Past Surgical History:  ?Procedure Laterality Date  ? ABDOMINAL HYSTERECTOMY    ? ANTERIOR FIXATION AND POSTERIOR MICRODISCECTOMY CERVICAL SPINE  1999  ? CAROTID ENDARTERECTOMY Left 11-04-13  ? cea  ? ENDARTERECTOMY Left 11/04/2013  ? IR ANGIO INTRA EXTRACRAN SEL COM CAROTID INNOMINATE BILAT MOD SED  11/16/2021  ? IR ANGIO INTRA EXTRACRAN SEL COM CAROTID INNOMINATE UNI L MOD SED  11/19/2021  ? IR ANGIO VERTEBRAL SEL VERTEBRAL BILAT MOD SED  11/16/2021  ? IR CT HEAD LTD  11/19/2021  ? IR INTRAVSC STENT CERV CAROTID W/EMB-PROT MOD SED INCL ANGIO  11/19/2021  ? IR RADIOLOGIST EVAL & MGMT  12/13/2021  ? ORIF ANKLE FRACTURE Left 09/16/2017  ? Procedure: OPEN REDUCTION INTERNAL FIXATION (ORIF) LEFT ANKLE FRACTURE;  Surgeon: Marchia Bond, MD;  Location: [facility];  Service: Orthopedics;  Laterality: Left;  ? RADIOLOGY WITH ANESTHESIA N/A 11/19/2021  ? Procedure: Left carotid  angioplasty with possible stenting;  Surgeon: Luanne Bras, MD;  Location: [facility];  Service: Radiology;  Laterality: N/A;  ? ?Patient Active Problem List  ? Diagnosis Date Noted  ? Deficiency anemia 11/26/2021  ? Multinodular goiter 11/02/2021  ? Encounter for general adult medical examination with abnormal findings 04/08/2021  ? Stage 3b chronic kidney disease ([place]) 04/02/2020  ? Visit for screening mammogram 04/01/2020  ? Type 2 diabetes mellitus with complication, with long-term current use of insulin ([place]) 09/18/2018  ? Age-related osteoporosis with current pathological fracture 02/20/2018  ? Intractable migraine without aura and with status migrainosus 11/24/2016  ? GERD (gastroesophageal reflux disease) 03/08/2016  ? Major depressive disorder, recurrent episode ([place]) 02/20/2016  ? Occlusion and stenosis of carotid artery with cerebral infarction 11/25/2013  ? Cerebrovascular accident (CVA) due to stenosis of left middle cerebral artery ([place]) 11/02/2013  ? Vitamin D deficiency 10/19/2013  ? Tobacco abuse 07/31/2013  ? Essential hypertension, benign 04/20/2013  ? Type II diabetes mellitus with manifestations ([place]) 04/20/2013  ? Hypothyroidism 04/20/2013  ? Hyperlipidemia with target LDL less than 70 04/20/2013  ? ? ?ONSET DATE: 11/13/2021 ? ?REFERRING DIAG: V25.366 (ICD-10-CM) - Cerebrovascular accident (CVA)  due to stenosis of left middle cerebral artery ([place])  ? ?THERAPY DIAG:  ?Cognitive communication deficit ? ?SUBJECTIVE: Pt attempted to buy medication box but tells ST drug store was closed.  ? ?PAIN:  ?Are you having pain? No ? ? ?OBJECTIVE:  ? ?TODAY'S TREATMENT:  ?12-18-21: Target medication management. Pt did not bring pill box, but ST modifies medication bottles to assist pt in determining when to take medications not in her pill pack. ST leads pt in generating strategy how she can best manage medications. Decided upon putting all pills into pill box, even those in pill box. Determined that weekly  would be best bet for pt. Pt to obtain pill box organizer this weekend.  Generated script pt can use in return to church, reports concern regarding people prying into why she hasn't been present. Engaged pt in narrative conversational task in which she relayed story line from previous episode of favorite show. Provides details on pertinent characters and their role in the show. Some prompting required to continue with task to completion. x1 occurrence of repeating details w/o awareness.  ? ?12-15-21: ST assisted pt in developing support to recall important components of morning routine to include taking blood sugar and blood pressure measurements. Pt reports sister is directing this currently, wishes to regain independence. Pt to implement at home with distant supervision from sister. Target word finding through SFA. Pt able to describe x1 action and x1 noun using SFA frame work with occasional min-A.  ? ?12-09-21: Target verbal expression compensations d/t pt c/o anomia in conversations. Education on strategies of using written aid when able, rehearsing for telephone calls. No overt word-finding difficulties observed throughout conversational speech sample of about 20 minutes. Generate medication management strategies. Pt to buy pill box and bring that and meds to following appointment.  ?  ?  ?PATIENT EDUCATION: ?Education details: aee above ?Person educated: Patient ?Education method: Explanation, Demonstration, and Handouts ?Education comprehension: verbalized understanding, returned demonstration, and needs further education ? ?HOME EDUCATION PROGRAM:  ? SFA, morning routine ?  ?  ?GOALS: ?Goals reviewed with patient? Yes ?  ?SHORT TERM GOALS: Target date: 12/30/2021 ?  ?Pt will accurately implement medication management system with mod I over 2 sessions.  ?Baseline: ?Goal status: ongoing ?  ?2.  Pt will teach back x3 attention and memory compensations/strategies, and ID functional use for at home, with occasional  min-A ?Baseline:  ?Goal status: ongoing ?  ?3.  Pt will verbalize x2 anomia compensations she can implement to reduce feelings of frustration when word finding difficulty occurs with occasional min-A ?Baseline:  ?Goal status: ongoing ?  ?LONG TERM GOALS: Target date: 02/24/2022 ?  ?Pt will report improved cognitive linguistic functioning via PROM by 2 points at last ST session ?Baseline:  ?Goal status: ongoing ?  ?2.  Pt will report successful implementation of medication management system with no missed doses over 1 week period  ?Baseline:  ?Goal status: ongoing ?  ?3.  Pt will use appropriate external aids for recall and management of finances, schedules, appointments, to-do's with occasional min A over 1 week ?Baseline:  ?Goal status: ongoing ?  ?4.  Pt will demonstrates [facility] verbal expression during mod-complex conversation of at least 15 minutes ?Baseline:  ?Goal status: ongoing ?  ?  ?ASSESSMENT: ?  ?CLINICAL IMPRESSION: ?Patient is a 71 y.o. F who was seen today for cognitive communication changes s/p left ICA stent placement. Pt reports that in Dec 2022, she began to notice changes in her  thinking. She went to hospital and was d/c. Returned in March 2023 and was found to have blockage in L ICA, had stent placed. It is unclear if this is in relation to cognitive changes experienced by pt. Pt currently requiring moderate amount of assistance from sister in managing iADLs such as financial management, medication management, cooking, shopping, schedule management. CLQT does not appear to fully reflect the functional deficits pt is experiencing at home. Deficits appear to be impacting pt's QoL d/t increased anxiety around difficulties, decreased participation in meaningful activities, and reliance on sister for assistance. ?  ?First change pt noticed was inability to recall names of students with whom she was familiar, previous had no difficulty. Has attempted to implement medication management system, reportedly  still having difficulties. Tells ST, "I can't remember if I have taken them or not." Pt is currently managing finances but again questions completeness of tasks and requires double checking to ensure bills a

## 2021-12-18 ENCOUNTER — Ambulatory Visit: Payer: Medicare Other | Admitting: Occupational Therapy

## 2021-12-18 ENCOUNTER — Ambulatory Visit: Payer: Medicare Other | Admitting: Speech Pathology

## 2021-12-18 ENCOUNTER — Ambulatory Visit: Payer: Medicare Other | Admitting: Physical Therapy

## 2021-12-18 DIAGNOSIS — R41841 Cognitive communication deficit: Secondary | ICD-10-CM

## 2021-12-18 DIAGNOSIS — R278 Other lack of coordination: Secondary | ICD-10-CM | POA: Diagnosis not present

## 2021-12-18 DIAGNOSIS — R2681 Unsteadiness on feet: Secondary | ICD-10-CM | POA: Diagnosis not present

## 2021-12-22 ENCOUNTER — Other Ambulatory Visit: Payer: Self-pay | Admitting: Internal Medicine

## 2021-12-22 DIAGNOSIS — Z794 Long term (current) use of insulin: Secondary | ICD-10-CM

## 2021-12-22 DIAGNOSIS — I10 Essential (primary) hypertension: Secondary | ICD-10-CM

## 2021-12-23 ENCOUNTER — Ambulatory Visit: Payer: Medicare Other | Admitting: Speech Pathology

## 2021-12-23 ENCOUNTER — Encounter: Payer: Medicare Other | Admitting: Speech Pathology

## 2021-12-23 ENCOUNTER — Ambulatory Visit: Payer: Medicare Other | Admitting: Physical Therapy

## 2021-12-23 ENCOUNTER — Encounter: Payer: Medicare Other | Admitting: Occupational Therapy

## 2021-12-23 DIAGNOSIS — R278 Other lack of coordination: Secondary | ICD-10-CM | POA: Diagnosis not present

## 2021-12-23 DIAGNOSIS — R41841 Cognitive communication deficit: Secondary | ICD-10-CM

## 2021-12-23 DIAGNOSIS — R2681 Unsteadiness on feet: Secondary | ICD-10-CM | POA: Diagnosis not present

## 2021-12-23 NOTE — Therapy (Signed)
?OUTPATIENT SPEECH LANGUAGE PATHOLOGY TREATMENT NOTE ? ? ?Patient Name: Sonia Skinner ?MRN: [id number] ?DOB:11-13-1950, 71 y.o., female ?Today's Date: 12/23/2021 ? ?PCP: [name], MD ?REFERRING PROVIDER: [name], MD ? ?END OF SESSION:  ? End of Session - 12/23/21 1448   ? ? Visit Number 5   ? Number of Visits 17   ? Date for SLP Re-Evaluation 01/26/22   ? Authorization Type [employer] Medicare   ? Progress Note Due on Visit 10   ? SLP Start Time 1448   ? SLP Stop Time  1530   ? SLP Time Calculation (min) 42 min   ? Activity Tolerance Patient tolerated treatment well   ? ?  ?  ? ?  ? ? ? ? ?Past Medical History:  ?Diagnosis Date  ? Ankle fracture   ? Left  ? Anxiety   ? Carotid artery occlusion   ? Closed fracture of left distal fibula 09/16/2017  ? Complication of anesthesia   ? ALLERY TO ESTER BASE  ? Depression   ? early 15s  ? Diabetes mellitus   ? INSULIN DEPENDENT  ? GERD (gastroesophageal reflux disease)   ? Headache   ? years ago  ? Hypertension   ? Pneumonia   ? Stroke [facility]) March 2015  ?  left MCA infarct, slight weakness on left side  ? Thyroid disease   ? ?Past Surgical History:  ?Procedure Laterality Date  ? ABDOMINAL HYSTERECTOMY    ? ANTERIOR FIXATION AND POSTERIOR MICRODISCECTOMY CERVICAL SPINE  1999  ? CAROTID ENDARTERECTOMY Left 11-04-13  ? cea  ? ENDARTERECTOMY Left 11/04/2013  ? IR ANGIO INTRA EXTRACRAN SEL COM CAROTID INNOMINATE BILAT MOD SED  11/16/2021  ? IR ANGIO INTRA EXTRACRAN SEL COM CAROTID INNOMINATE UNI L MOD SED  11/19/2021  ? IR ANGIO VERTEBRAL SEL VERTEBRAL BILAT MOD SED  11/16/2021  ? IR CT HEAD LTD  11/19/2021  ? IR INTRAVSC STENT CERV CAROTID W/EMB-PROT MOD SED INCL ANGIO  11/19/2021  ? IR RADIOLOGIST EVAL & MGMT  12/13/2021  ? ORIF ANKLE FRACTURE Left 09/16/2017  ? Procedure: OPEN REDUCTION INTERNAL FIXATION (ORIF) LEFT ANKLE FRACTURE;  Surgeon: Marchia Bond, MD;  Location: [facility];  Service: Orthopedics;  Laterality: Left;  ? RADIOLOGY WITH ANESTHESIA N/A 11/19/2021  ? Procedure: Left carotid  angioplasty with possible stenting;  Surgeon: Luanne Bras, MD;  Location: [place];  Service: Radiology;  Laterality: N/A;  ? ?Patient Active Problem List  ? Diagnosis Date Noted  ? Deficiency anemia 11/26/2021  ? Multinodular goiter 11/02/2021  ? Encounter for general adult medical examination with abnormal findings 04/08/2021  ? Stage 3b chronic kidney disease ([place]) 04/02/2020  ? Visit for screening mammogram 04/01/2020  ? Type 2 diabetes mellitus with complication, with long-term current use of insulin ([employer]) 09/18/2018  ? Age-related osteoporosis with current pathological fracture 02/20/2018  ? Intractable migraine without aura and with status migrainosus 11/24/2016  ? GERD (gastroesophageal reflux disease) 03/08/2016  ? Major depressive disorder, recurrent episode ([place]) 02/20/2016  ? Occlusion and stenosis of carotid artery with cerebral infarction 11/25/2013  ? Cerebrovascular accident (CVA) due to stenosis of left middle cerebral artery ([place]) 11/02/2013  ? Vitamin D deficiency 10/19/2013  ? Tobacco abuse 07/31/2013  ? Essential hypertension, benign 04/20/2013  ? Type II diabetes mellitus with manifestations ([place]) 04/20/2013  ? Hypothyroidism 04/20/2013  ? Hyperlipidemia with target LDL less than 70 04/20/2013  ? ? ?ONSET DATE: 11/13/2021 ? ?REFERRING DIAG: P95.093 (ICD-10-CM) - Cerebrovascular accident (  CVA) due to stenosis of left middle cerebral artery ([name])  ? ?THERAPY DIAG:  ?Cognitive communication deficit ? ?SUBJECTIVE: "I'm good"  ? ?PAIN:  ?Are you having pain? No ? ? ?OBJECTIVE:  ? ?TODAY'S TREATMENT:  ?12-23-21: Pt reports she has not yet gotten pill box, is planning to get one. Education on memory compensations for maintaining adherence to daily schedule. Pt reports approx 50% success in using morning to-do list for checking blood sugar and taking insulin. Specifically checking blood sugar and taking insulin prior to lunch time meal. Generated strategy to create daily reminder in phone for 9 AM as  pt states this is a good time for earliest. Max-A for also putting in alert for mid day and evening checks.  Printed out schedule to aid in appointment management. Pt files into  newly acquired folio IND using to organize her therapy and other medical information. Discussion on organization process for managing schedule, implementation of to-do list for increased success with completing desired tasks.  ? ?12-18-21: Target medication management. Pt did not bring pill box, but ST modifies medication bottles to assist pt in determining when to take medications not in her pill pack. ST leads pt in generating strategy how she can best manage medications. Decided upon putting all pills into pill box, even those in pill box. Determined that weekly would be best bet for pt. Pt to obtain pill box organizer this weekend.  Generated script pt can use in return to church, reports concern regarding people prying into why she hasn't been present. Engaged pt in narrative conversational task in which she relayed story line from previous episode of favorite show. Provides details on pertinent characters and their role in the show. Some prompting required to continue with task to completion. x1 occurrence of repeating details w/o awareness.  ? ?12-15-21: ST assisted pt in developing support to recall important components of morning routine to include taking blood sugar and blood pressure measurements. Pt reports sister is directing this currently, wishes to regain independence. Pt to implement at home with distant supervision from sister. Target word finding through SFA. Pt able to describe x1 action and x1 noun using SFA frame work with occasional min-A.  ? ?12-09-21: Target verbal expression compensations d/t pt c/o anomia in conversations. Education on strategies of using written aid when able, rehearsing for telephone calls. No overt word-finding difficulties observed throughout conversational speech sample of about 20 minutes.  Generate medication management strategies. Pt to buy pill box and bring that and meds to following appointment.  ?  ?  ?PATIENT EDUCATION: ?Education details: aee above ?Person educated: Patient ?Education method: Explanation, Demonstration, and Handouts ?Education comprehension: verbalized understanding, returned demonstration, and needs further education ? ?HOME EDUCATION PROGRAM:  ? SFA, morning routine ?  ?  ?GOALS: ?Goals reviewed with patient? Yes ?  ?SHORT TERM GOALS: Target date: 12/30/2021 ?  ?Pt will accurately implement medication management system with mod I over 2 sessions.  ?Baseline:  ?Goal status: ongoing ?  ?2.  Pt will teach back x3 attention and memory compensations/strategies, and ID functional use for at home, with occasional min-A ?Baseline: 12/23/21 ?Goal status: ongoing ?  ?3.  Pt will verbalize x2 anomia compensations she can implement to reduce feelings of frustration when word finding difficulty occurs with occasional min-A ?Baseline: 12/23/21 ?Goal status: ongoing ?  ?LONG TERM GOALS: Target date: 02/24/2022 ?  ?Pt will report improved cognitive linguistic functioning via PROM by 2 points at last ST session ?Baseline:  ?Goal  status: ongoing ?  ?2.  Pt will report successful implementation of medication management system with no missed doses over 1 week period  ?Baseline:  ?Goal status: ongoing ?  ?3.  Pt will use appropriate external aids for recall and management of finances, schedules, appointments, to-do's with occasional min A over 1 week ?Baseline:  ?Goal status: ongoing ?  ?4.  Pt will demonstrates [facility] verbal expression during mod-complex conversation of at least 15 minutes ?Baseline:  ?Goal status: ongoing ?  ?  ?ASSESSMENT: ?  ?CLINICAL IMPRESSION: ?Patient is a 71 y.o. F who was seen today for cognitive communication changes s/p left ICA stent placement. Pt reports that in Dec 2022, she began to notice changes in her thinking. She went to hospital and was d/c. Returned in March 2023  and was found to have blockage in L ICA, had stent placed. It is unclear if this is in relation to cognitive changes experienced by pt. Pt currently requiring moderate amount of assistance from sister in ma

## 2021-12-25 ENCOUNTER — Ambulatory Visit: Payer: Medicare Other | Admitting: Physical Therapy

## 2021-12-25 ENCOUNTER — Ambulatory Visit (INDEPENDENT_AMBULATORY_CARE_PROVIDER_SITE_OTHER): Payer: Medicare Other | Admitting: Vascular Surgery

## 2021-12-25 ENCOUNTER — Encounter: Payer: Self-pay | Admitting: Vascular Surgery

## 2021-12-25 ENCOUNTER — Ambulatory Visit: Payer: Medicare Other | Admitting: Speech Pathology

## 2021-12-25 VITALS — BP 117/56 | HR 85 | Temp 97.9°F | Resp 14 | Ht 62.0 in | Wt 128.9 lb

## 2021-12-25 DIAGNOSIS — R41841 Cognitive communication deficit: Secondary | ICD-10-CM

## 2021-12-25 DIAGNOSIS — I6522 Occlusion and stenosis of left carotid artery: Secondary | ICD-10-CM

## 2021-12-25 DIAGNOSIS — R2681 Unsteadiness on feet: Secondary | ICD-10-CM | POA: Diagnosis not present

## 2021-12-25 DIAGNOSIS — R278 Other lack of coordination: Secondary | ICD-10-CM | POA: Diagnosis not present

## 2021-12-25 NOTE — Progress Notes (Signed)
? ? ?REASON FOR VISIT:  ? ?Follow-up of carotid disease. ? ?MEDICAL ISSUES:  ? ?LEFT CAROTID STENOSIS: This patient underwent a left carotid endarterectomy in 2015.  She was last seen in 2019 and then was lost to follow-up.  She presented this March with a TIA and was found to have a 70% left carotid stenosis.  This was treated with a transfemoral carotid stent by Dr. [name] with an excellent result.  She is on aspirin and a statin.  He plans on following her carotid disease so we will not arrange for routine follow-up studies here.  Certainly I be happy to see her back and arrange follow-up if something changes. ? ? ?HPI:  ? ?Sonia Skinner is a pleasant 71 y.o. female who I last saw on 03/15/2018.  She underwent a left carotid endarterectomy for symptomatic left carotid stenosis in 2015.  She also had carotid siphon disease bilaterally.  She comes in for yearly carotid duplex scan as she had a known 50% right carotid stenosis.  Her duplex at that time showed that the left carotid endarterectomy site was widely patent.  There was no significant stenosis on the right.  She was to come in for yearly duplex scans.  It looks like she was then lost to follow-up. ? ?The patient presented in March of this year with some visual disturbance and speech impairment.  Her MR a of the neck suggested a 70% left proximal ICA stenosis.  She underwent cerebral arteriography and a left ICA stent on 11/19/2021 by Dr. [name].  She did well after that. ? ?Since she was in the hospital she denies any focal weakness or paresthesias, expressive or receptive aphasia, or amaurosis fugax. ? ?Past Medical History:  ?Diagnosis Date  ? Ankle fracture   ? Left  ? Anxiety   ? Carotid artery occlusion   ? Closed fracture of left distal fibula 09/16/2017  ? Complication of anesthesia   ? ALLERY TO ESTER BASE  ? Depression   ? early 32s  ? Diabetes mellitus   ? INSULIN DEPENDENT  ? GERD (gastroesophageal reflux disease)   ? Headache   ? years ago  ?  Hypertension   ? Pneumonia   ? Stroke [facility]) March 2015  ?  left MCA infarct, slight weakness on left side  ? Thyroid disease   ? ? ?Family History  ?Problem Relation Age of Onset  ? Diabetes Mother   ? Heart disease Mother   ?     Before age 14  ? Cancer Father   ?     Lung  ? Hypertension Sister   ? Diabetes Sister   ? Diabetes Sister   ? Diabetes Sister   ? ? ?SOCIAL HISTORY: ?Social History  ? ?Tobacco Use  ? Smoking status: Former  ?  Packs/day: 1.00  ?  Years: 20.00  ?  Pack years: 20.00  ?  Types: Cigarettes  ?  Quit date: 11/01/2013  ?  Years since quitting: 8.1  ? Smokeless tobacco: Never  ?Substance Use Topics  ? Alcohol use: Not Currently  ?  Alcohol/week: 1.0 - 2.0 standard drink  ?  Types: 1 - 2 Standard drinks or equivalent per week  ? ? ?Allergies  ?Allergen Reactions  ? Anesthetics, Ester Anaphylaxis  ? ? ?Current Outpatient Medications  ?Medication Sig Dispense Refill  ? Accu-Chek FastClix Lancets MISC Use to check blood sugar five times daily. 510 each 2  ? acetaminophen (TYLENOL) 500 MG tablet Take  1,000-1,500 mg by mouth every 6 (six) hours as needed for mild pain.    ? amitriptyline (ELAVIL) 100 MG tablet Take 1 tablet (100 mg total) by mouth at bedtime. 90 tablet 0  ? amLODipine (NORVASC) 5 MG tablet Take 1 tablet (5 mg total) by mouth daily. 90 tablet 3  ? aspirin 81 MG chewable tablet Chew 1 tablet (81 mg total) by mouth daily. 30 tablet 1  ? atorvastatin (LIPITOR) 80 MG tablet Take 0.5 tablets (40 mg total) by mouth daily. 30 tablet 1  ? buPROPion (WELLBUTRIN XL) 300 MG 24 hr tablet Take 1 tablet (300 mg total) by mouth daily. 90 tablet 0  ? Cholecalciferol (VITAMIN D3 PO) Take 1 tablet by mouth daily. Unsure of dose    ? Continuous Blood Gluc Receiver (DEXCOM G7 RECEIVER) DEVI 1 Act by Does not apply route daily. 2 each 5  ? Continuous Blood Gluc Sensor (DEXCOM G7 SENSOR) MISC 1 Act by Does not apply route daily. 2 each 5  ? Continuous Blood Gluc Sensor (DEXCOM G7 SENSOR) MISC 1 Act by Does  not apply route daily. 2 each 5  ? dexlansoprazole (DEXILANT) 60 MG capsule Take 1 capsule by mouth daily. 90 capsule 0  ? EDARBYCLOR 40-12.5 MG TABS Take 1 tablet by mouth daily. 90 tablet 0  ? EUTHYROX 88 MCG tablet Take 1 tablet by mouth once daily 90 tablet 0  ? ezetimibe (ZETIA) 10 MG tablet Take 1 tablet (10 mg total) by mouth daily. 30 tablet 1  ? FARXIGA 10 MG TABS tablet Take 1 tablet by mouth daily before breakfast. 90 tablet 1  ? gabapentin (NEURONTIN) 100 MG capsule Take 1 capsule (100 mg total) by mouth 3 (three) times daily. (Patient taking differently: Take 100-200 mg by mouth See admin instructions. 200 mg int the morning ?100 mg at bedtime) 270 capsule 1  ? Insulin Glargine (BASAGLAR KWIKPEN [place]) Inject 30-50 Units into the skin daily. 30-35 units if below 150 ?50 units if over 150    ? insulin lispro (HUMALOG KWIKPEN) 200 UNIT/ML KwikPen Inject 5 Units into the skin 3 (three) times daily with meals. 9 mL 1  ? Insulin Pen Needle 32G X 4 MM MISC Use to administer insulin four times a day 360 each 3  ? loratadine (CLARITIN) 10 MG tablet Take 1 tablet (10 mg total) by mouth daily. 90 tablet 0  ? Semaglutide, 2 MG/DOSE, 8 MG/3ML SOPN Inject 2 mg as directed once a week. (Patient taking differently: Inject 2 mg as directed every 14 (fourteen) days.) 9 mL 1  ? ticagrelor (BRILINTA) 90 MG TABS tablet Take 1 tablet (90 mg total) by mouth 2 (two) times daily. 60 tablet 1  ? ?No current facility-administered medications for this visit.  ? ? ?REVIEW OF SYSTEMS:  ?'[X]'$  denotes positive finding, '[ ]'$  denotes negative finding ?Cardiac  Comments:  ?Chest pain or chest pressure:    ?Shortness of breath upon exertion:    ?Short of breath when lying flat:    ?Irregular heart rhythm:    ?    ?Vascular    ?Pain in calf, thigh, or hip brought on by ambulation:    ?Pain in feet at night that wakes you up from your sleep:     ?Blood clot in your veins:    ?Leg swelling:     ?    ?Pulmonary    ?Oxygen at home:    ?Productive  cough:     ?Wheezing:     ?    ?  Neurologic    ?Sudden weakness in arms or legs:     ?Sudden numbness in arms or legs:     ?Sudden onset of difficulty speaking or slurred speech:    ?Temporary loss of vision in one eye:     ?Problems with dizziness:     ?    ?Gastrointestinal    ?Blood in stool:     ?Vomited blood:     ?    ?Genitourinary    ?Burning when urinating:     ?Blood in urine:    ?    ?Psychiatric    ?Major depression:     ?    ?Hematologic    ?Bleeding problems:    ?Problems with blood clotting too easily:    ?    ?Skin    ?Rashes or ulcers:    ?    ?Constitutional    ?Fever or chills:    ? ?PHYSICAL EXAM:  ? ?Vitals:  ? 12/25/21 1622 12/25/21 1624  ?BP: (!) 119/54 (!) 117/56  ?Pulse: 85 85  ?Resp: 14   ?Temp: 97.9 ?F (36.6 ?C)   ?TempSrc: Temporal   ?SpO2: 100%   ?Weight: 128 lb 14.4 oz (58.5 kg)   ?Height: '5\' 2"'$  (1.575 m)   ? ? ?GENERAL: The patient is a well-nourished female, in no acute distress. The vital signs are documented above. ?CARDIAC: There is a regular rate and rhythm.  ?VASCULAR: She has bilateral carotid bruits. ?She has palpable femoral and pedal pulses bilaterally. ?PULMONARY: There is good air exchange bilaterally without wheezing or rales. ?ABDOMEN: Soft and non-tender with normal pitched bowel sounds.  ?MUSCULOSKELETAL: There are no major deformities or cyanosis. ?NEUROLOGIC: No focal weakness or paresthesias are detected. ?SKIN: There are no ulcers or rashes noted. ?PSYCHIATRIC: The patient has a normal affect. ? ?DATA:   ? ?CAROTID DUPLEX: I have reviewed the carotid duplex scan that was done on 08/15/2021. ? ?On the right side there was a less than 39% stenosis.  The right vertebral artery was patent with antegrade flow. ? ?On the left side, there was a recurrent 60 to 79% stenosis in the proximal ICA.  Peak systolic velocity was 810 cm/s with an end-diastolic velocity of 94 cm/s.  The left vertebral artery was patent with antegrade flow. ? ?ECHO: I did review her recent echo on  11/14/2021.  This shows that the left ventricular ejection fraction is estimated at greater than 70%.  The left ventricle had hyperdynamic function. ? ?Deitra Mayo ?[facility]

## 2021-12-25 NOTE — Patient Instructions (Addendum)
Organizing Room Task List:  ? ?Cross off items as you complete them. Try and work a little bit every day to get room organized. You'll feel better in a clean space.  ? ?Put shaws and coats into closet ?Separate out sports wear from school clothing ?Put clothes to donate into box ?Finish organizing paperwork ?Vacuum  ?Put shoes away ?Fix upside down clothing rack  ?  ?  ?  ?  ?  ?  ? ? ?Scheduling Appointments: ? ?Goal: verify time/date works for Sister prior to scheduling ? ?Plan: when scheduling, call sister at that moment before confirming appointment time works  ? ?If you forget: ?tell your sister the appointment time as soon as you remember  ?If appointment time is ok, then great! ?If not, call the office and re-schedule. Preferably when your sister is with you ?

## 2021-12-25 NOTE — Therapy (Signed)
?OUTPATIENT SPEECH LANGUAGE PATHOLOGY TREATMENT NOTE ? ? ?Patient Name: Sonia Skinner ?MRN: [id number] ?DOB:Jan 13, 1951, 71 y.o., female ?Today's Date: 12/25/2021 ? ?PCP: [name], MD ?REFERRING PROVIDER: [name], MD ? ?END OF SESSION:  ? End of Session - 12/25/21 1359   ? ? Visit Number 6   ? Number of Visits 17   ? Date for SLP Re-Evaluation 01/26/22   ? Authorization Type [employer] Medicare   ? Progress Note Due on Visit 10   ? SLP Start Time 1400   ? SLP Stop Time  1442   ? SLP Time Calculation (min) 42 min   ? Activity Tolerance Patient tolerated treatment well   ? ?  ?  ? ?  ? ? ? ? ? ?Past Medical History:  ?Diagnosis Date  ? Ankle fracture   ? Left  ? Anxiety   ? Carotid artery occlusion   ? Closed fracture of left distal fibula 09/16/2017  ? Complication of anesthesia   ? ALLERY TO ESTER BASE  ? Depression   ? early 34s  ? Diabetes mellitus   ? INSULIN DEPENDENT  ? GERD (gastroesophageal reflux disease)   ? Headache   ? years ago  ? Hypertension   ? Pneumonia   ? Stroke [facility]) March 2015  ?  left MCA infarct, slight weakness on left side  ? Thyroid disease   ? ?Past Surgical History:  ?Procedure Laterality Date  ? ABDOMINAL HYSTERECTOMY    ? ANTERIOR FIXATION AND POSTERIOR MICRODISCECTOMY CERVICAL SPINE  1999  ? CAROTID ENDARTERECTOMY Left 11-04-13  ? cea  ? ENDARTERECTOMY Left 11/04/2013  ? IR ANGIO INTRA EXTRACRAN SEL COM CAROTID INNOMINATE BILAT MOD SED  11/16/2021  ? IR ANGIO INTRA EXTRACRAN SEL COM CAROTID INNOMINATE UNI L MOD SED  11/19/2021  ? IR ANGIO VERTEBRAL SEL VERTEBRAL BILAT MOD SED  11/16/2021  ? IR CT HEAD LTD  11/19/2021  ? IR INTRAVSC STENT CERV CAROTID W/EMB-PROT MOD SED INCL ANGIO  11/19/2021  ? IR RADIOLOGIST EVAL & MGMT  12/13/2021  ? ORIF ANKLE FRACTURE Left 09/16/2017  ? Procedure: OPEN REDUCTION INTERNAL FIXATION (ORIF) LEFT ANKLE FRACTURE;  Surgeon: Marchia Bond, MD;  Location: [place];  Service: Orthopedics;  Laterality: Left;  ? RADIOLOGY WITH ANESTHESIA N/A 11/19/2021  ? Procedure: Left carotid  angioplasty with possible stenting;  Surgeon: Luanne Bras, MD;  Location: [place];  Service: Radiology;  Laterality: N/A;  ? ?Patient Active Problem List  ? Diagnosis Date Noted  ? Deficiency anemia 11/26/2021  ? Multinodular goiter 11/02/2021  ? Encounter for general adult medical examination with abnormal findings 04/08/2021  ? Stage 3b chronic kidney disease ([place]) 04/02/2020  ? Visit for screening mammogram 04/01/2020  ? Type 2 diabetes mellitus with complication, with long-term current use of insulin ([place]) 09/18/2018  ? Age-related osteoporosis with current pathological fracture 02/20/2018  ? Intractable migraine without aura and with status migrainosus 11/24/2016  ? GERD (gastroesophageal reflux disease) 03/08/2016  ? Major depressive disorder, recurrent episode ([place]) 02/20/2016  ? Occlusion and stenosis of carotid artery with cerebral infarction 11/25/2013  ? Cerebrovascular accident (CVA) due to stenosis of left middle cerebral artery ([place]) 11/02/2013  ? Vitamin D deficiency 10/19/2013  ? Tobacco abuse 07/31/2013  ? Essential hypertension, benign 04/20/2013  ? Type II diabetes mellitus with manifestations ([place]) 04/20/2013  ? Hypothyroidism 04/20/2013  ? Hyperlipidemia with target LDL less than 70 04/20/2013  ? ? ?ONSET DATE: 11/13/2021 ? ?REFERRING DIAG: S92.330 (ICD-10-CM) - Cerebrovascular  accident (CVA) due to stenosis of left middle cerebral artery ([place])  ? ?THERAPY DIAG:  ?Cognitive communication deficit ? ?SUBJECTIVE: "I completed the behavioral paperwork and got an appointment in June"  ? ?PAIN:  ?Are you having pain? No ? ? ?OBJECTIVE:  ? ?TODAY'S TREATMENT:  ?12-25-21: Pt reports sister has requested that she verify appointment times prior to her scheduling. Completed Goal Plan Do Review to generate strategy how pt could resolve this issue when scheduling, determined calling sister when scheduling instead of waiting. Generated script and pt successfully practiced in session with mod-I. With mod-A,  generated solution to if pt forgets in future to verify when scheduling. Pt reports increased success in recall of checking blood glucose and blood pressure prior to eating with use of external compensation trained previous session. Generated to-do list for organizing room with rare min-A.  ? ?12-23-21: Pt reports she has not yet gotten pill box, is planning to get one. Education on memory compensations for maintaining adherence to daily schedule. Pt reports approx 50% success in using morning to-do list for checking blood sugar and taking insulin. Specifically checking blood sugar and taking insulin prior to lunch time meal. Generated strategy to create daily reminder in phone for 9 AM as pt states this is a good time for earliest. Max-A for also putting in alert for mid day and evening checks.  Printed out schedule to aid in appointment management. Pt files into  newly acquired folio IND using to organize her therapy and other medical information. Discussion on organization process for managing schedule, implementation of to-do list for increased success with completing desired tasks.  ?  ?PATIENT EDUCATION: ?Education details: aee above ?Person educated: Patient ?Education method: Explanation, Demonstration, and Handouts ?Education comprehension: verbalized understanding, returned demonstration, and needs further education ? ?HOME EDUCATION PROGRAM:  ? to-do list, use alarms for medication management, GPDR for scheduling appointments ?  ?  ?GOALS: ?Goals reviewed with patient? Yes ?  ?SHORT TERM GOALS: Target date: 12/30/2021 ?  ?Pt will accurately implement medication management system with mod I over 2 sessions.  ?Baseline:  ?Goal status: ongoing ?  ?2.  Pt will teach back x3 attention and memory compensations/strategies, and ID functional use for at home, with occasional min-A ?Baseline: 12/23/21 ?Goal status: ongoing ?  ?3.  Pt will verbalize x2 anomia compensations she can implement to reduce feelings of  frustration when word finding difficulty occurs with occasional min-A ?Baseline: 12/23/21 ?Goal status: ongoing ?  ?LONG TERM GOALS: Target date: 02/24/2022 ?  ?Pt will report improved cognitive linguistic functioning via PROM by 2 points at last ST session ?Baseline:  ?Goal status: ongoing ?  ?2.  Pt will report successful implementation of medication management system with no missed doses over 1 week period  ?Baseline:  ?Goal status: ongoing ?  ?3.  Pt will use appropriate external aids for recall and management of finances, schedules, appointments, to-do's with occasional min A over 1 week ?Baseline:  ?Goal status: ongoing ?  ?4.  Pt will demonstrates [facility] verbal expression during mod-complex conversation of at least 15 minutes ?Baseline:  ?Goal status: ongoing ?  ?  ?ASSESSMENT: ?  ?CLINICAL IMPRESSION: ?Patient is a 71 y.o. F who was seen today for cognitive communication changes s/p left ICA stent placement. Pt reports that in Dec 2022, she began to notice changes in her thinking. She went to hospital and was d/c. Returned in March 2023 and was found to have blockage in L ICA, had stent placed.  It is unclear if this is in relation to cognitive changes experienced by pt. Pt currently requiring moderate amount of assistance from sister in managing iADLs such as financial management, medication management, cooking, shopping, schedule management. CLQT does not appear to fully reflect the functional deficits pt is experiencing at home. Deficits appear to be impacting pt's QoL d/t increased anxiety around difficulties, decreased participation in meaningful activities, and reliance on sister for assistance. ?  ?First change pt noticed was inability to recall names of students with whom she was familiar, previous had no difficulty. Has attempted to implement medication management system, reportedly still having difficulties. Tells ST, "I can't remember if I have taken them or not." Pt is currently managing finances but  again questions completeness of tasks and requires double checking to ensure bills are paid. Energy and motivation factors impacting ability to complete daily to-do list. Organizational issues, tells ST "m

## 2021-12-28 ENCOUNTER — Ambulatory Visit: Payer: Medicare Other | Admitting: Endocrinology

## 2021-12-29 ENCOUNTER — Ambulatory Visit: Payer: Medicare Other | Admitting: Physical Therapy

## 2021-12-29 ENCOUNTER — Ambulatory Visit: Payer: Medicare Other

## 2021-12-29 DIAGNOSIS — R278 Other lack of coordination: Secondary | ICD-10-CM | POA: Diagnosis not present

## 2021-12-29 DIAGNOSIS — R41841 Cognitive communication deficit: Secondary | ICD-10-CM

## 2021-12-29 DIAGNOSIS — R2681 Unsteadiness on feet: Secondary | ICD-10-CM | POA: Diagnosis not present

## 2021-12-29 NOTE — Therapy (Signed)
?OUTPATIENT SPEECH LANGUAGE PATHOLOGY TREATMENT NOTE ? ? ?Patient Name: Sonia Skinner ?MRN: [id number] ?DOB:31-Jul-1951, 71 y.o., female ?Today's Date: 12/29/2021 ? ?PCP: [name], MD ?REFERRING PROVIDER: [name], MD ? ?END OF SESSION:  ? End of Session - 12/29/21 1447   ? ? Visit Number 7   ? Number of Visits 17   ? Date for SLP Re-Evaluation 01/26/22   ? Authorization Type [employer] Medicare   ? SLP Start Time 1447   ? SLP Stop Time  1528   ? SLP Time Calculation (min) 41 min   ? Activity Tolerance Patient tolerated treatment well   ? ?  ?  ? ?  ? ? ? ? ? ?Past Medical History:  ?Diagnosis Date  ? Ankle fracture   ? Left  ? Anxiety   ? Carotid artery occlusion   ? Closed fracture of left distal fibula 09/16/2017  ? Complication of anesthesia   ? ALLERY TO ESTER BASE  ? Depression   ? early 79s  ? Diabetes mellitus   ? INSULIN DEPENDENT  ? GERD (gastroesophageal reflux disease)   ? Headache   ? years ago  ? Hypertension   ? Pneumonia   ? Stroke [facility]) March 2015  ?  left MCA infarct, slight weakness on left side  ? Thyroid disease   ? ?Past Surgical History:  ?Procedure Laterality Date  ? ABDOMINAL HYSTERECTOMY    ? ANTERIOR FIXATION AND POSTERIOR MICRODISCECTOMY CERVICAL SPINE  1999  ? CAROTID ENDARTERECTOMY Left 11-04-13  ? cea  ? ENDARTERECTOMY Left 11/04/2013  ? IR ANGIO INTRA EXTRACRAN SEL COM CAROTID INNOMINATE BILAT MOD SED  11/16/2021  ? IR ANGIO INTRA EXTRACRAN SEL COM CAROTID INNOMINATE UNI L MOD SED  11/19/2021  ? IR ANGIO VERTEBRAL SEL VERTEBRAL BILAT MOD SED  11/16/2021  ? IR CT HEAD LTD  11/19/2021  ? IR INTRAVSC STENT CERV CAROTID W/EMB-PROT MOD SED INCL ANGIO  11/19/2021  ? IR RADIOLOGIST EVAL & MGMT  12/13/2021  ? ORIF ANKLE FRACTURE Left 09/16/2017  ? Procedure: OPEN REDUCTION INTERNAL FIXATION (ORIF) LEFT ANKLE FRACTURE;  Surgeon: Marchia Bond, MD;  Location: [place];  Service: Orthopedics;  Laterality: Left;  ? RADIOLOGY WITH ANESTHESIA N/A 11/19/2021  ? Procedure: Left carotid angioplasty with possible  stenting;  Surgeon: Luanne Bras, MD;  Location: [place];  Service: Radiology;  Laterality: N/A;  ? ?Patient Active Problem List  ? Diagnosis Date Noted  ? Deficiency anemia 11/26/2021  ? Multinodular goiter 11/02/2021  ? Encounter for general adult medical examination with abnormal findings 04/08/2021  ? Stage 3b chronic kidney disease ([place]) 04/02/2020  ? Visit for screening mammogram 04/01/2020  ? Type 2 diabetes mellitus with complication, with long-term current use of insulin ([place]) 09/18/2018  ? Age-related osteoporosis with current pathological fracture 02/20/2018  ? Intractable migraine without aura and with status migrainosus 11/24/2016  ? GERD (gastroesophageal reflux disease) 03/08/2016  ? Major depressive disorder, recurrent episode ([place]) 02/20/2016  ? Occlusion and stenosis of carotid artery with cerebral infarction 11/25/2013  ? Cerebrovascular accident (CVA) due to stenosis of left middle cerebral artery ([place]) 11/02/2013  ? Vitamin D deficiency 10/19/2013  ? Tobacco abuse 07/31/2013  ? Essential hypertension, benign 04/20/2013  ? Type II diabetes mellitus with manifestations ([place]) 04/20/2013  ? Hypothyroidism 04/20/2013  ? Hyperlipidemia with target LDL less than 70 04/20/2013  ? ? ?ONSET DATE: 11/13/2021 ? ?REFERRING DIAG: B71.696 (ICD-10-CM) - Cerebrovascular accident (CVA) due to stenosis of left middle cerebral  artery ([place])  ? ?THERAPY DIAG:  ?Cognitive communication deficit ? ?SUBJECTIVE: "Remembering is the hardest part" ? ?PAIN:  ?Are you having pain? No ? ? ?OBJECTIVE:  ? ?TODAY'S TREATMENT:  ?12-29-21: Pt reported frustration related to frequent word finding difficulty in conversation. SLP educated and instructed word retrieval strategies to aid communication effectiveness. SLP targeted use of description and synonym strategies this session. SLP conducted trials of SFA, in which pt completed with mod I to name properties for thorough description. Occasional min A to name appropriate synonyms  (word versus phrase). Pt inquired about neurologist, in which pt able to set reminder in phone to aid recall for task completion. Reminder for medication and meals have reportedly effective.  ? ?12-25-21: Pt reports sister has requested that she verify appointment times prior to her scheduling. Completed Goal Plan Do Review to generate strategy how pt could resolve this issue when scheduling, determined calling sister when scheduling instead of waiting. Generated script and pt successfully practiced in session with mod-I. With mod-A, generated solution to if pt forgets in future to verify when scheduling. Pt reports increased success in recall of checking blood glucose and blood pressure prior to eating with use of external compensation trained previous session. Generated to-do list for organizing room with rare min-A.  ? ?12-23-21: Pt reports she has not yet gotten pill box, is planning to get one. Education on memory compensations for maintaining adherence to daily schedule. Pt reports approx 50% success in using morning to-do list for checking blood sugar and taking insulin. Specifically checking blood sugar and taking insulin prior to lunch time meal. Generated strategy to create daily reminder in phone for 9 AM as pt states this is a good time for earliest. Max-A for also putting in alert for mid day and evening checks.  Printed out schedule to aid in appointment management. Pt files into  newly acquired folio IND using to organize her therapy and other medical information. Discussion on organization process for managing schedule, implementation of to-do list for increased success with completing desired tasks.  ?  ?PATIENT EDUCATION: ?Education details: see above ?Person educated: Patient ?Education method: Explanation, Demonstration, and Handouts ?Education comprehension: verbalized understanding, returned demonstration, and needs further education ? ?HOME EDUCATION PROGRAM:  ? to-do list, use alarms for  medication management, GPDR for scheduling appointments ?  ?  ?GOALS: ?Goals reviewed with patient? Yes ?  ?SHORT TERM GOALS: Target date: 12/30/2021 ?  ?Pt will accurately implement medication management system with mod I over 2 sessions.  ?Baseline: 12-29-21 ?Goal status: ongoing ?  ?2.  Pt will teach back x3 attention and memory compensations/strategies, and ID functional use for at home, with occasional min-A ?Baseline: 12/23/21 ?Goal status: ongoing ?  ?3.  Pt will verbalize x2 anomia compensations she can implement to reduce feelings of frustration when word finding difficulty occurs with occasional min-A ?Baseline: 12/23/21 ?Goal status: ongoing ?  ?LONG TERM GOALS: Target date: 02/24/2022 ?  ?Pt will report improved cognitive linguistic functioning via PROM by 2 points at last ST session ?Baseline:  ?Goal status: ongoing ?  ?2.  Pt will report successful implementation of medication management system with no missed doses over 1 week period  ?Baseline:  ?Goal status: ongoing ?  ?3.  Pt will use appropriate external aids for recall and management of finances, schedules, appointments, to-do's with occasional min A over 1 week ?Baseline:  ?Goal status: ongoing ?  ?4.  Pt will demonstrates [facility] verbal expression during mod-complex conversation of  at least 15 minutes ?Baseline:  ?Goal status: ongoing ?  ?  ?ASSESSMENT: ?  ?CLINICAL IMPRESSION: ?Patient is a 71 y.o. F who was seen today for cognitive communication changes s/p left ICA stent placement. Pt reports that in Dec 2022, she began to notice changes in her thinking. She went to hospital and was d/c. Returned in March 2023 and was found to have blockage in L ICA, had stent placed. It is unclear if this is in relation to cognitive changes experienced by pt. Pt currently requiring moderate amount of assistance from sister in managing iADLs such as financial management, medication management, cooking, shopping, schedule management. CLQT does not appear to fully  reflect the functional deficits pt is experiencing at home. Deficits appear to be impacting pt's QoL d/t increased anxiety around difficulties, decreased participation in meaningful activities, and reliance on sister f

## 2021-12-29 NOTE — Patient Instructions (Signed)

## 2021-12-30 ENCOUNTER — Encounter: Payer: Self-pay | Admitting: Internal Medicine

## 2021-12-30 ENCOUNTER — Ambulatory Visit (INDEPENDENT_AMBULATORY_CARE_PROVIDER_SITE_OTHER): Payer: Medicare Other | Admitting: Internal Medicine

## 2021-12-30 VITALS — BP 128/82 | HR 92 | Ht 62.0 in | Wt 130.2 lb

## 2021-12-30 DIAGNOSIS — Z794 Long term (current) use of insulin: Secondary | ICD-10-CM | POA: Diagnosis not present

## 2021-12-30 DIAGNOSIS — E042 Nontoxic multinodular goiter: Secondary | ICD-10-CM | POA: Diagnosis not present

## 2021-12-30 DIAGNOSIS — E118 Type 2 diabetes mellitus with unspecified complications: Secondary | ICD-10-CM

## 2021-12-30 MED ORDER — TOUJEO SOLOSTAR 300 UNIT/ML ~~LOC~~ SOPN
26.0000 [IU] | PEN_INJECTOR | Freq: Every day | SUBCUTANEOUS | 3 refills | Status: DC
Start: 2021-12-30 — End: 2022-02-09

## 2021-12-30 NOTE — Patient Instructions (Addendum)
Please continue: ?- Farxiga 10 mg before breakfast ?- Ozempic 2 mg weekly ? ?Please decrease: ?-  Toujeo 26 units daily ? ?If the sugars after a meal are >180 consistently, then restart Humalog 2-5 units 15 min before that meal. ? ?Take the thyroid hormone every day, with water, at least 30 minutes before breakfast, separated by at least 4 hours from: ?- acid reflux medications ?- calcium ?- iron ?- multivitamins ? ?Move breakfast >30 min after levothyroxine. ? ?Move Dexilant >4h after levothyroxine. ? ?Please return in 4 months. ? ?

## 2021-12-30 NOTE — Progress Notes (Signed)
Patient ID: Sonia Skinner, female   DOB: 1951/03/18, 71 y.o.   MRN: [id number] ? ?HPI: ?Sonia Skinner is a 71 y.o.-year-old female, returning for follow-up for DM2, dx in 1962, insulin-dependent since 2000, uncontrolled, with complications (CVA, left carotid stenosis, PAD, CKD stage IV, PN, history of hypoglycemia, history of nonketotic hyperglycemic state). Pt. previously saw Dr. Loanne Drilling, last visit 10/2021. She is here with her sister who is a caregiver  - offers info about her PMH, blood sugars and medications. ? ?Reviewed HbA1c: ?Lab Results  ?Component Value Date  ? HGBA1C 7.8 (H) 11/14/2021  ? HGBA1C 8.0 (A) 11/02/2021  ? HGBA1C 8.5 (H) 08/15/2021  ? HGBA1C 7.2 (H) 04/07/2021  ? HGBA1C 10.2 (H) 04/01/2020  ? HGBA1C 7.3 (A) 06/20/2019  ? HGBA1C 8.5 (H) 12/06/2018  ? HGBA1C 9.3 (A) 09/18/2018  ? HGBA1C 8.8 (H) 06/21/2018  ? HGBA1C 15.6 (H) 02/20/2018  ? ?Pt is on a regimen of: ?- Farxiga 10 mg before breakfast - started 08/2021 ?- Basaglar >> NPH 35 >> Toujeo 30 units at bedtime - takes 50% of the time ?- Humalog 2-5 units 3x a day before meals - misses doses ?- Ozempic 2 mg weekly - "as needed" ? ?Pt checks her sugars more than 4 times a day with her Dexcom CGM: ? ? ?Lowest sugar was 40s; she has hypoglycemia awareness at 70.  ?Highest sugar was 300s.  In 2017, she had nonketotic hyperglycemic state. ? ?Glucometer: Accu-Chek guide ? ?- + CKD stage IV, last BUN/creatinine:  ?11/26/2021: GFR 19.4 ?Lab Results  ?Component Value Date  ? BUN 39 (H) 11/26/2021  ? BUN 29 (H) 11/25/2021  ? CREATININE 2.43 (H) 11/26/2021  ? CREATININE 2.12 (H) 11/25/2021  ?Not on ACE inhibitor/ARB. ? ?-+ HL; last set of lipids: ?Lab Results  ?Component Value Date  ? CHOL 183 11/14/2021  ? HDL 42 11/14/2021  ? LDLCALC 101 (H) 11/14/2021  ? LDLDIRECT 141 (H) 03/04/2010  ? TRIG 198 (H) 11/14/2021  ? CHOLHDL 4.4 11/14/2021  ?On Lipitor 40 mg daily. On Zetia 10 mg daily. ? ?- last eye exam was in 05/2021. No DR.  ? ?- no numbness and tingling in  her feet.  Foot exam 03/2022.  She is on gabapentin 100 mg 3 times a day. ? ?She has a history of multinodular goiter, diagnosed in 2017. ? ?Latest thyroid ultrasound (11/09/2021) reviewed: ?Parenchymal Echotexture: Moderately heterogenous  ?Isthmus: Normal in size measuring 0.3 cm in diameter, unchanged  ?Right lobe: Normal in size measuring 5.0 x 1.8 x 1.0 cm, previously, 4.9 x 1.7 x 1.4 cm  ?Left lobe: Normal in size measuring 4.8 x 1.6 x 1.6 cm, previously, 5.3 x 2.0 x 1.9 cm ?_________________________________________________________ ?  ?The approximately 1.2 x 1.0 x 0.9 cm isoechoic ill-defined nodule ?within the mid aspect the right lobe of the thyroid (labeled 1), is ?grossly unchanged compared to the 05/2016 examination, previously, ?1.2 x 1.1 x 0.9 cm and is again noted to contain an internal ?partially shadowing macrocalcification. Imaging stability for ?greater than 5 years is indicative of a benign etiology. ?  ?The approximately 1.0 x 1.0 x 0.5 cm spongiform/benign-appearing ?nodule within the inferior, medial aspect of the right lobe of the ?thyroid (labeled 2), is grossly unchanged compared to the 05/2016 ?examination, previously, 1.0 cm, and again does not meet criteria to ?recommend percutaneous sampling or continued dedicated follow-up.  ?_________________________________________________________ ?  ?The approximately 2.5 x 1.5 x 1.3 cm isoechoic nodule within mid ?aspect the left  lobe of the thyroid (labeled 3), is unchanged ?compared to the 05/2016 examination, previously, 2.5 x 1.8 x 1.3 cm ?and is again noted to contain an internal partially shadowing ?macrocalcification. Imaging stability for greater than 5 years is ?indicative of a benign etiology. ?  ?IMPRESSION: ?1. Similar findings of multinodular goiter. No worrisome new or ?enlarging thyroid nodules. ?2. All discretely thyroid nodules are unchanged since the 05/2016 ?examination. By report, dominant bilateral thyroid nodules may have ?been  biopsied at an outside institution. If so, correlation with ?previous biopsy results is advised. Otherwise, imaging stability for ?greater than 5 years is indicative of a benign etiology and as such ?percutaneous sampling and/or continued dedicated follow-up is not ?recommended. ? ?She has uncontrolled hypothyroidism: ? ?She is on Synthroid 88 mcg daily: ?- in am ?- fasting ?- right before b'fast ?- no calcium ?- no iron ?- no multivitamins ?- + PPIs  - Dexilant in am 30 min to 1h ?- not on Biotin ? ?Reviewed most recent TFTs: ?Lab Results  ?Component Value Date  ? TSH 1.088 11/14/2021  ? TSH 11.69 (H) 11/02/2021  ? TSH 0.25 (L) 08/27/2021  ? TSH 0.053 (L) 08/15/2021  ? TSH 33.15 (H) 04/07/2021  ? TSH 4.06 04/01/2020  ? TSH 4.73 (H) 06/20/2019  ? TSH 27.16 (H) 12/06/2018  ? TSH 0.02 (L) 10/25/2018  ? TSH 0.16 (L) 09/18/2018  ? ?She is not on steroids or biotin. ? ?She also has a history of GERD, headache, HTN, depression. ? ?ROS: ?+ see HPI ?No increased urination, blurry vision, nausea, chest pain. ? ?Past Medical History:  ?Diagnosis Date  ? Ankle fracture   ? Left  ? Anxiety   ? Carotid artery occlusion   ? Closed fracture of left distal fibula 09/16/2017  ? Complication of anesthesia   ? ALLERY TO ESTER BASE  ? Depression   ? early 47s  ? Diabetes mellitus   ? INSULIN DEPENDENT  ? GERD (gastroesophageal reflux disease)   ? Headache   ? years ago  ? Hypertension   ? Pneumonia   ? Stroke [facility]) March 2015  ?  left MCA infarct, slight weakness on left side  ? Thyroid disease   ? ?Past Surgical History:  ?Procedure Laterality Date  ? ABDOMINAL HYSTERECTOMY    ? ANTERIOR FIXATION AND POSTERIOR MICRODISCECTOMY CERVICAL SPINE  1999  ? CAROTID ENDARTERECTOMY Left 11-04-13  ? cea  ? ENDARTERECTOMY Left 11/04/2013  ? IR ANGIO INTRA EXTRACRAN SEL COM CAROTID INNOMINATE BILAT MOD SED  11/16/2021  ? IR ANGIO INTRA EXTRACRAN SEL COM CAROTID INNOMINATE UNI L MOD SED  11/19/2021  ? IR ANGIO VERTEBRAL SEL VERTEBRAL BILAT MOD SED   11/16/2021  ? IR CT HEAD LTD  11/19/2021  ? IR INTRAVSC STENT CERV CAROTID W/EMB-PROT MOD SED INCL ANGIO  11/19/2021  ? IR RADIOLOGIST EVAL & MGMT  12/13/2021  ? ORIF ANKLE FRACTURE Left 09/16/2017  ? Procedure: OPEN REDUCTION INTERNAL FIXATION (ORIF) LEFT ANKLE FRACTURE;  Surgeon: Marchia Bond, MD;  Location: [place];  Service: Orthopedics;  Laterality: Left;  ? RADIOLOGY WITH ANESTHESIA N/A 11/19/2021  ? Procedure: Left carotid angioplasty with possible stenting;  Surgeon: Luanne Bras, MD;  Location: [place];  Service: Radiology;  Laterality: N/A;  ? ?Social History  ? ?Socioeconomic History  ? Marital status: Legally Separated  ?  Spouse name: Not on file  ? Number of children: 0  ? Years of education: College  ? Highest education level: Not on file  ?  Occupational History  ? Occupation: GCS  ?  Employer: [employer]  ?Tobacco Use  ? Smoking status: Former  ?  Packs/day: 1.00  ?  Years: 20.00  ?  Pack years: 20.00  ?  Types: Cigarettes  ?  Quit date: 11/01/2013  ?  Years since quitting: 8.1  ? Smokeless tobacco: Never  ?Vaping Use  ? Vaping Use: Never used  ?Substance and Sexual Activity  ? Alcohol use: Not Currently  ?  Alcohol/week: 1.0 - 2.0 standard drink  ?  Types: 1 - 2 Standard drinks or equivalent per week  ? Drug use: No  ? Sexual activity: Not Currently  ?Other Topics Concern  ? Not on file  ?Social History Narrative  ? Patient lives in 1 story home with sister.  ? Caffeine Use: 2 cups daily; sodas occasionally  ? Some college education  ? Works as Oceanographer for [employer]  ? ?Social Determinants of Health  ? ?Financial Resource Strain: Not on file  ?Food Insecurity: No Food Insecurity  ? Worried About Charity fundraiser in the Last Year: Never true  ? Ran Out of Food in the Last Year: Never true  ?Transportation Needs: No Transportation Needs  ? Lack of Transportation (Medical): No  ? Lack of Transportation (Non-Medical): No  ?Physical Activity: Not on file  ?Stress: Stress Concern Present  ? Feeling of  Stress : Very much  ?Social Connections: Not on file  ?Intimate Partner Violence: Not on file  ? ?Current Outpatient Medications on File Prior to Visit  ?Medication Sig Dispense Refill  ? Accu-Chek [employer]

## 2021-12-31 ENCOUNTER — Ambulatory Visit: Payer: Medicare Other

## 2021-12-31 DIAGNOSIS — R41841 Cognitive communication deficit: Secondary | ICD-10-CM

## 2021-12-31 DIAGNOSIS — R278 Other lack of coordination: Secondary | ICD-10-CM | POA: Diagnosis not present

## 2021-12-31 DIAGNOSIS — R2681 Unsteadiness on feet: Secondary | ICD-10-CM | POA: Diagnosis not present

## 2021-12-31 NOTE — Patient Instructions (Addendum)
Call your Primary Care Physician (PCP) for referral to neurology   Association/New Routine:  PLUG IN DEVICE/PHONE and then evening medications   Let's step up reminders your phone to see if that helps you remember better than the alarms

## 2021-12-31 NOTE — Therapy (Signed)
OUTPATIENT SPEECH LANGUAGE PATHOLOGY TREATMENT NOTE   Patient Name: Sonia Skinner MRN: [id number] DOB:09/08/1950, 71 y.o., female Today's Date: 12/31/2021  PCP: [name], MD REFERRING PROVIDER: [name], MD  END OF SESSION:   End of Session - 12/31/21 1449     Visit Number 8    Number of Visits 17    Date for SLP Re-Evaluation 01/26/22    Authorization Type [employer] Medicare    SLP Start Time 1450   pt arrived late   SLP Stop Time  49    SLP Time Calculation (min) 40 min    Activity Tolerance Patient tolerated treatment well                Past Medical History:  Diagnosis Date   Ankle fracture    Left   Anxiety    Carotid artery occlusion    Closed fracture of left distal fibula 12/16/9765   Complication of anesthesia    ALLERY TO ESTER BASE   Depression    early 63s   Diabetes mellitus    INSULIN DEPENDENT   GERD (gastroesophageal reflux disease)    Headache    years ago   Hypertension    Pneumonia    Stroke [facility]) March 2015    left MCA infarct, slight weakness on left side   Thyroid disease    Past Surgical History:  Procedure Laterality Date   ABDOMINAL HYSTERECTOMY     ANTERIOR FIXATION AND POSTERIOR MICRODISCECTOMY CERVICAL SPINE  1999   CAROTID ENDARTERECTOMY Left 11-04-13   cea   ENDARTERECTOMY Left 11/04/2013   IR ANGIO INTRA EXTRACRAN SEL COM CAROTID INNOMINATE BILAT MOD SED  11/16/2021   IR ANGIO INTRA EXTRACRAN SEL COM CAROTID INNOMINATE UNI L MOD SED  11/19/2021   IR ANGIO VERTEBRAL SEL VERTEBRAL BILAT MOD SED  11/16/2021   IR CT HEAD LTD  11/19/2021   IR INTRAVSC STENT CERV CAROTID W/EMB-PROT MOD SED INCL ANGIO  11/19/2021   IR RADIOLOGIST EVAL & MGMT  12/13/2021   ORIF ANKLE FRACTURE Left 09/16/2017   Procedure: OPEN REDUCTION INTERNAL FIXATION (ORIF) LEFT ANKLE FRACTURE;  Surgeon: Marchia Bond, MD;  Location: [place];  Service: Orthopedics;  Laterality: Left;   RADIOLOGY WITH ANESTHESIA N/A 11/19/2021   Procedure: Left carotid angioplasty with  possible stenting;  Surgeon: Luanne Bras, MD;  Location: [place];  Service: Radiology;  Laterality: N/A;   Patient Active Problem List   Diagnosis Date Noted   Deficiency anemia 11/26/2021   Multinodular goiter 11/02/2021   Encounter for general adult medical examination with abnormal findings 04/08/2021   Stage 3b chronic kidney disease ([place]) 04/02/2020   Visit for screening mammogram 04/01/2020   Type 2 diabetes mellitus with complication, with long-term current use of insulin ([place]) 09/18/2018   Age-related osteoporosis with current pathological fracture 02/20/2018   Intractable migraine without aura and with status migrainosus 11/24/2016   GERD (gastroesophageal reflux disease) 03/08/2016   Major depressive disorder, recurrent episode ([place]) 02/20/2016   Occlusion and stenosis of carotid artery with cerebral infarction 11/25/2013   Cerebrovascular accident (CVA) due to stenosis of left middle cerebral artery ([place]) 11/02/2013   Vitamin D deficiency 10/19/2013   Tobacco abuse 07/31/2013   Essential hypertension, benign 04/20/2013   Type II diabetes mellitus with manifestations ([place]) 04/20/2013   Hypothyroidism 04/20/2013   Hyperlipidemia with target LDL less than 70 04/20/2013    ONSET DATE: 11/13/2021  REFERRING DIAG: H41.937 (ICD-10-CM) - Cerebrovascular accident (CVA) due to stenosis  of left middle cerebral artery (HCC)   THERAPY DIAG:  Cognitive communication deficit  SUBJECTIVE: "just another day"  PAIN:  Are you having pain? No   OBJECTIVE:   TODAY'S TREATMENT:  12-31-21: Pt again asked about neurology referral (discussed last session). SLP wrote down information to aid recall and attention of this information. Pt concerned about word finding; however, in extensive 20+ minute conversation, no overt word finding or atypical pausing/halting noted. SLP affirmed patient is clearly compensating and repairing any possible communication breakdown effectively as this is not  noticeable for trained listener. SLP provided recommendations to aid possible word finding for upcoming church meeting, including rehearsal and scripting as needed. Pt c/o difficulty managing medications (alarms not consistently effective as pt ignores them) and recalling to plug in devices. SLP suggested establishment of routine (associating medications/plugging in device) and use of visual aids. SLP trialed Reminder app in phone, in which pt reported this may be beneficial. Additional education and training to be completed next session.   12-29-21: Pt reported frustration related to frequent word finding difficulty in conversation. SLP educated and instructed word retrieval strategies to aid communication effectiveness. SLP targeted use of description and synonym strategies this session. SLP conducted trials of SFA, in which pt completed with mod I to name properties for thorough description. Occasional min A to name appropriate synonyms (word versus phrase). Pt inquired about neurologist, in which pt able to set reminder in phone to aid recall for task completion. Reminder for medication and meals have reportedly effective.   12-25-21: Pt reports sister has requested that she verify appointment times prior to her scheduling. Completed Goal Plan Do Review to generate strategy how pt could resolve this issue when scheduling, determined calling sister when scheduling instead of waiting. Generated script and pt successfully practiced in session with mod-I. With mod-A, generated solution to if pt forgets in future to verify when scheduling. Pt reports increased success in recall of checking blood glucose and blood pressure prior to eating with use of external compensation trained previous session. Generated to-do list for organizing room with rare min-A.   12-23-21: Pt reports she has not yet gotten pill box, is planning to get one. Education on memory compensations for maintaining adherence to daily schedule. Pt  reports approx 50% success in using morning to-do list for checking blood sugar and taking insulin. Specifically checking blood sugar and taking insulin prior to lunch time meal. Generated strategy to create daily reminder in phone for 9 AM as pt states this is a good time for earliest. Max-A for also putting in alert for mid day and evening checks.  Printed out schedule to aid in appointment management. Pt files into  newly acquired folio IND using to organize her therapy and other medical information. Discussion on organization process for managing schedule, implementation of to-do list for increased success with completing desired tasks.    PATIENT EDUCATION: Education details: see above Person educated: Patient Education method: Consulting civil engineer, Demonstration, and Handouts Education comprehension: verbalized understanding, returned demonstration, and needs further education  HOME EDUCATION PROGRAM:   to-do list, use alarms for medication management, GPDR for scheduling appointments     GOALS: Goals reviewed with patient? Yes   SHORT TERM GOALS: Target date: 12/30/2021   Pt will accurately implement medication management system with mod I over 2 sessions.  Baseline: 12-29-21 Goal status: Partially Met   2.  Pt will teach back x3 attention and memory compensations/strategies, and ID functional use for at home, with  occasional min-A Baseline: 12/23/21 Goal status: Partially Met   3.  Pt will verbalize x2 anomia compensations she can implement to reduce feelings of frustration when word finding difficulty occurs with occasional min-A Baseline: 12/23/21, 12-29-21 Goal status: Met   LONG TERM GOALS: Target date: 02/24/2022   Pt will report improved cognitive linguistic functioning via PROM by 2 points at last ST session Baseline:  Goal status: ongoing   2.  Pt will report successful implementation of medication management system with no missed doses over 1 week period  Baseline:  Goal  status: ongoing   3.  Pt will use appropriate external aids for recall and management of finances, schedules, appointments, to-do's with occasional min A over 1 week Baseline:  Goal status: ongoing   4.  Pt will demonstrates [facility] verbal expression during mod-complex conversation of at least 15 minutes Baseline:  Goal status: ongoing     ASSESSMENT:   CLINICAL IMPRESSION: Patient is a 71 y.o. F who was seen today for cognitive communication changes s/p left ICA stent placement. Pt currently requiring moderate amount of assistance from sister in managing iADLs such as financial management, medication management, cooking, shopping, schedule management.  Deficits appear to be impacting pt's QoL d/t increased anxiety around difficulties, decreased participation in meaningful activities, and reliance on sister for assistance. Conducted ongoing education and training of memory attention compensations and word retrieval strategies to maximize functional independence with inconsistent implementation reported. I recommend skilled ST to address functional impairments 2/2 cognitive communication deficits in areas of memory, attention, executive functioning. Additionally will provide pt with anomia strategies for when reported aphasia-like events occur.    OBJECTIVE IMPAIRMENTS include attention, memory, executive functioning, expressive language, and dysarthria. These impairments are limiting patient from managing medications, managing appointments, managing finances, household responsibilities, and effectively communicating at home and in community. No overt factors affecting potential to achieve goals and functional outcomes observed or reported this date. Patient will benefit from skilled SLP services to address above impairments and improve overall function.   REHAB POTENTIAL: Good   PLAN: SLP FREQUENCY: 2x/week   SLP DURATION: 8 weeks   PLANNED INTERVENTIONS: Language facilitation, Environmental  controls, Cueing hierachy, Cognitive reorganization, Internal/external aids, Functional tasks, Multimodal communication approach, and SLP instruction and feedback  Marzetta Board, CCC-SLP 12/31/2021, 2:50 PM

## 2022-01-01 ENCOUNTER — Ambulatory Visit: Payer: Medicare Other

## 2022-01-01 ENCOUNTER — Encounter: Payer: Medicare Other | Admitting: Occupational Therapy

## 2022-01-04 ENCOUNTER — Ambulatory Visit: Payer: Medicare Other | Admitting: Licensed Clinical Social Worker

## 2022-01-04 DIAGNOSIS — I10 Essential (primary) hypertension: Secondary | ICD-10-CM

## 2022-01-04 DIAGNOSIS — F332 Major depressive disorder, recurrent severe without psychotic features: Secondary | ICD-10-CM

## 2022-01-04 NOTE — Chronic Care Management (AMB) (Signed)
Care Management Clinical Social Work Note  01/04/2022 Name: Sonia Skinner MRN: [id number] DOB: 26-Jul-1951  Sonia Skinner is a 71 y.o. year old female who is a primary care patient of Sonia Lima, MD.  The Care Management team was consulted for assistance with chronic disease management and coordination needs.  Engaged with patient by telephone for follow up visit in response to provider referral for social work chronic care management and care coordination services  Consent to Services:  Sonia Skinner was given information about Care Management services today including:  Care Management services includes personalized support from designated clinical staff supervised by her physician, including individualized plan of care and coordination with other care providers 24/7 contact phone numbers for assistance for urgent and routine care needs. The patient may stop case management services at any time by phone call to the office staff.  Patient agreed to services and consent obtained.   Summary: Assessed patient's current treatment, progress, coping skills, support system and barriers to care.  She was able to schedule an appointment with [facility] but first availability is the end of June, she would like to see someone sooner .  See Care Plan below for interventions and patient self-care actives.  Recommendation: Patient may benefit from, and is in agreement to explore other options for therapy. She will complete paperwork for [facility].  Follow up Plan: Patient would like continued follow-up from CCM LCSW.  per patient's request will follow up in 2 weeks.  Will call office if needed prior to next encounter.   Assessment: Review of patient past medical history, allergies, medications, and health status, including review of relevant consultants reports was performed today as part of a comprehensive evaluation and provision of chronic care management and care coordination  services.  SDOH (Social Determinants of Health) assessments and interventions performed:    Advanced Directives Status: Not addressed in this encounter.  Care Plan Conditions to be addressed/monitored: Depression;   Care Plan : LCSW Plan of Care  Updates made by Sonia Cane, LCSW since 01/04/2022 12:00 AM     Problem: Coping Skills      Goal: Coping Skills Enhanced   Start Date: 12/02/2021  This Visit's Progress: On track  Recent Progress: On track  Priority: High  Note:   Current Barriers:  Disease Management support and education needs related to Depression: anxiety Stress  and concern with memory loss and difficulty with organizing thoughts    CSW Clinical Goal(s):  Patient  will verbalize understanding of plan for management of Depression  through collaboration with Clinical Social Worker, provider, and care team.   Interventions: Inter-disciplinary care team collaboration (see longitudinal plan of care) Evaluation of current treatment plan related to  self management and patient's adherence to plan as established by provider  Mental Health:  (Status: Goal on Track (progressing): YES.) Evaluation of current treatment plan related to Depression: anxiety Solution-Focused Strategies employed:  Problem [place] strategies reviewed [facility] July would like to explore other options Collaborated with [facility] for sooner therapy appointment  Task & activities to accomplish goals: Call your insurance provider for more information about your Enhanced Benefits  Complete paper work for [employer] [facility] counseling Enhanced benefits are transportation and money on your U-Card     Sonia Lanius, LCSW Licensed Clinical Social Worker Warehouse manager [facility] [place]  [phone]

## 2022-01-04 NOTE — Patient Instructions (Signed)
Visit Information  Thank you for taking time to visit with me today. Please don't hesitate to contact me if I can be of assistance to you before our next scheduled telephone appointment.  Following are the goals we discussed today: Managing Symptoms of Depression Task & activities to accomplish goals: Call your insurance provider for more information about your Enhanced Benefits  Complete paper work for [employer] [facility] counseling Enhanced benefits are transportation and money on your U-Card      [name], LCSW Licensed Clinical Social Worker [name]  [place]  [phone]   Our next appointment is by telephone on June 5th  at 1:15  Please call the care guide team at [phone] if you need to cancel or reschedule your appointment.   If you are experiencing a Mental Health or [place] or need someone to talk to, please call 1-800-273-TALK (toll free, 24 hour hotline)   Patient verbalizes understanding of instructions and care plan provided today and agrees to view in [place]. Active MyChart status and patient understanding of how to access instructions and care plan via MyChart confirmed with patient.

## 2022-01-05 ENCOUNTER — Encounter: Payer: Medicare Other | Admitting: Occupational Therapy

## 2022-01-05 ENCOUNTER — Ambulatory Visit: Payer: Medicare Other | Admitting: Speech Pathology

## 2022-01-05 DIAGNOSIS — R41841 Cognitive communication deficit: Secondary | ICD-10-CM | POA: Diagnosis not present

## 2022-01-05 DIAGNOSIS — R2681 Unsteadiness on feet: Secondary | ICD-10-CM | POA: Diagnosis not present

## 2022-01-05 DIAGNOSIS — R278 Other lack of coordination: Secondary | ICD-10-CM | POA: Diagnosis not present

## 2022-01-05 NOTE — Therapy (Signed)
OUTPATIENT SPEECH LANGUAGE PATHOLOGY TREATMENT NOTE   Patient Name: Sonia Skinner MRN: [id number] DOB:[birth year], 71 y.o., female Today's Date: 01/05/2022  PCP: [name], MD REFERRING PROVIDER: [name], MD  END OF SESSION:   End of Session - 01/05/22 1318     Visit Number 9    Number of Visits 17    Date for SLP Re-Evaluation 01/26/22    Authorization Type [employer] Medicare    Progress Note Due on Visit 10    SLP Start Time 1318    SLP Stop Time  56    SLP Time Calculation (min) 40 min    Activity Tolerance Patient tolerated treatment well                 Past Medical History:  Diagnosis Date   Ankle fracture    Left   Anxiety    Carotid artery occlusion    Closed fracture of left distal fibula 10/18/4560   Complication of anesthesia    ALLERY TO ESTER BASE   Depression    early 63s   Diabetes mellitus    INSULIN DEPENDENT   GERD (gastroesophageal reflux disease)    Headache    years ago   Hypertension    Pneumonia    Stroke [facility]) March 2015    left MCA infarct, slight weakness on left side   Thyroid disease    Past Surgical History:  Procedure Laterality Date   ABDOMINAL HYSTERECTOMY     ANTERIOR FIXATION AND POSTERIOR MICRODISCECTOMY CERVICAL SPINE  1999   CAROTID ENDARTERECTOMY Left 11-04-13   cea   ENDARTERECTOMY Left 11/04/2013   IR ANGIO INTRA EXTRACRAN SEL COM CAROTID INNOMINATE BILAT MOD SED  11/16/2021   IR ANGIO INTRA EXTRACRAN SEL COM CAROTID INNOMINATE UNI L MOD SED  11/19/2021   IR ANGIO VERTEBRAL SEL VERTEBRAL BILAT MOD SED  11/16/2021   IR CT HEAD LTD  11/19/2021   IR INTRAVSC STENT CERV CAROTID W/EMB-PROT MOD SED INCL ANGIO  11/19/2021   IR RADIOLOGIST EVAL & MGMT  12/13/2021   ORIF ANKLE FRACTURE Left 09/16/2017   Procedure: OPEN REDUCTION INTERNAL FIXATION (ORIF) LEFT ANKLE FRACTURE;  Surgeon: Marchia Bond, MD;  Location: [place];  Service: Orthopedics;  Laterality: Left;   RADIOLOGY WITH ANESTHESIA N/A 11/19/2021   Procedure: Left  carotid angioplasty with possible stenting;  Surgeon: Luanne Bras, MD;  Location: [place];  Service: Radiology;  Laterality: N/A;   Patient Active Problem List   Diagnosis Date Noted   Deficiency anemia 11/26/2021   Multinodular goiter 11/02/2021   Encounter for general adult medical examination with abnormal findings 04/08/2021   Stage 3b chronic kidney disease ([place]) 04/02/2020   Visit for screening mammogram 04/01/2020   Type 2 diabetes mellitus with complication, with long-term current use of insulin ([place]) 09/18/2018   Age-related osteoporosis with current pathological fracture 02/20/2018   Intractable migraine without aura and with status migrainosus 11/24/2016   GERD (gastroesophageal reflux disease) 03/08/2016   Major depressive disorder, recurrent episode ([place]) 02/20/2016   Occlusion and stenosis of carotid artery with cerebral infarction 11/25/2013   Cerebrovascular accident (CVA) due to stenosis of left middle cerebral artery ([place]) 11/02/2013   Vitamin D deficiency 10/19/2013   Tobacco abuse 07/31/2013   Essential hypertension, benign 04/20/2013   Type II diabetes mellitus with manifestations ([place]) 04/20/2013   Hypothyroidism 04/20/2013   Hyperlipidemia with target LDL less than 70 04/20/2013    ONSET DATE: 11/13/2021  REFERRING DIAG: B63.893 (ICD-10-CM) -  Cerebrovascular accident (CVA) due to stenosis of left middle cerebral artery (HCC)   THERAPY DIAG:  Cognitive communication deficit  SUBJECTIVE: "It's frustrating my sister doesn't understand"  PAIN:  Are you having pain? No   OBJECTIVE:   TODAY'S TREATMENT:  01-05-22: Target use of alarm to recall medication and blood pressure/glucose management. ID breakdown occurring in ignoring set alarms, then forgetting. Use of errorless learning and spaced retrieval with using alarm. Usual mod-A to go through steps of checking blood sugar and verbalizing steps of annotating notes of actions taken. Generated strategy to  address stress at home with impaired ability to express thoughts. Pt to write down thoughts/feelings, create a bullet point list to guide discussion and support conversation with sister. Sorted pt's medications into pill box. Usual mod-A to ID errors of double filling. 100% accuracy in recall of which medications are taken at which time of day.   12-31-21: Pt again asked about neurology referral (discussed last session). SLP wrote down information to aid recall and attention of this information. Pt concerned about word finding; however, in extensive 20+ minute conversation, no overt word finding or atypical pausing/halting noted. SLP affirmed patient is clearly compensating and repairing any possible communication breakdown effectively as this is not noticeable for trained listener. SLP provided recommendations to aid possible word finding for upcoming church meeting, including rehearsal and scripting as needed. Pt c/o difficulty managing medications (alarms not consistently effective as pt ignores them) and recalling to plug in devices. SLP suggested establishment of routine (associating medications/plugging in device) and use of visual aids. SLP trialed Reminder app in phone, in which pt reported this may be beneficial. Additional education and training to be completed next session.    PATIENT EDUCATION: Education details: see above Person educated: Patient Education method: Consulting civil engineer, Demonstration, and Handouts Education comprehension: verbalized understanding, returned demonstration, and needs further education  HOME EDUCATION PROGRAM:   to-do list, use alarms for medication management, GPDR for scheduling appointments     GOALS: Goals reviewed with patient? Yes   SHORT TERM GOALS: Target date: 12/30/2021   Pt will accurately implement medication management system with mod I over 2 sessions.  Baseline: 12-29-21 Goal status: Partially Met   2.  Pt will teach back x3 attention and memory  compensations/strategies, and ID functional use for at home, with occasional min-A Baseline: 12/23/21 Goal status: Partially Met   3.  Pt will verbalize x2 anomia compensations she can implement to reduce feelings of frustration when word finding difficulty occurs with occasional min-A Baseline: 12/23/21, 12-29-21 Goal status: Met   LONG TERM GOALS: Target date: 02/24/2022   Pt will report improved cognitive linguistic functioning via PROM by 2 points at last ST session Baseline:  Goal status: ongoing   2.  Pt will report successful implementation of medication management system with no missed doses over 1 week period  Baseline:  Goal status: ongoing   3.  Pt will use appropriate external aids for recall and management of finances, schedules, appointments, to-do's with occasional min A over 1 week Baseline:  Goal status: ongoing   4.  Pt will demonstrates [facility] verbal expression during mod-complex conversation of at least 15 minutes Baseline:  Goal status: ongoing     ASSESSMENT:   CLINICAL IMPRESSION: Patient is a 71 y.o. F who was seen today for cognitive communication changes s/p left ICA stent placement. Pt currently requiring moderate amount of assistance from sister in managing iADLs such as financial management, medication management, cooking, shopping, schedule  management.  Deficits appear to be impacting pt's QoL d/t increased anxiety around difficulties, decreased participation in meaningful activities, and reliance on sister for assistance. Conducted ongoing education and training of memory attention compensations and word retrieval strategies to maximize functional independence with inconsistent implementation reported. I recommend skilled ST to address functional impairments 2/2 cognitive communication deficits in areas of memory, attention, executive functioning. Additionally will provide pt with anomia strategies for when reported aphasia-like events occur.    OBJECTIVE  IMPAIRMENTS include attention, memory, executive functioning, expressive language, and dysarthria. These impairments are limiting patient from managing medications, managing appointments, managing finances, household responsibilities, and effectively communicating at home and in community. No overt factors affecting potential to achieve goals and functional outcomes observed or reported this date. Patient will benefit from skilled SLP services to address above impairments and improve overall function.   REHAB POTENTIAL: Good   PLAN: SLP FREQUENCY: 2x/week   SLP DURATION: 8 weeks   PLANNED INTERVENTIONS: Language facilitation, Environmental controls, Cueing hierachy, Cognitive reorganization, Internal/external aids, Functional tasks, Multimodal communication approach, and SLP instruction and feedback  Su Monks, CCC-SLP 01/05/2022, 1:47 PM

## 2022-01-05 NOTE — Patient Instructions (Addendum)
Neurology:  You currently have an appoint with [facility]. To change this appointment to [facility], you need to:  contact your primary care physician and ask to have the referral sent to [place]  call and see if you can make an appointment    Mitigating stress at home:  I recommend have a discussion regarding what is going well and what you're currently experiencing.   Brain dump all your thoughts and feelings onto a sheet of paper WRITE IT DOWN Sleep on it, then revisit that list, pare it down to key bullet points you want to talk about Set aside a time to talk to your sister Use your list to communicate those important thoughts, feelings, and ideas    Pill Organizer:  Friday and Saturday AM and PM spots need pill pack medications!!

## 2022-01-07 ENCOUNTER — Ambulatory Visit: Payer: Medicare Other | Admitting: Speech Pathology

## 2022-01-07 ENCOUNTER — Encounter: Payer: Medicare Other | Admitting: Occupational Therapy

## 2022-01-07 DIAGNOSIS — R41841 Cognitive communication deficit: Secondary | ICD-10-CM | POA: Diagnosis not present

## 2022-01-07 DIAGNOSIS — R278 Other lack of coordination: Secondary | ICD-10-CM | POA: Diagnosis not present

## 2022-01-07 DIAGNOSIS — R2681 Unsteadiness on feet: Secondary | ICD-10-CM | POA: Diagnosis not present

## 2022-01-07 NOTE — Therapy (Signed)
OUTPATIENT SPEECH LANGUAGE PATHOLOGY TREATMENT NOTE   Patient Name: Sonia Skinner MRN: [id number] DOB:February 21, 1951, 71 y.o., female Today's Date: 01/07/2022  PCP: [name], MD REFERRING PROVIDER: [name], MD  END OF SESSION:   End of Session - 01/07/22 1314     Visit Number 10    Number of Visits 61    Date for SLP Re-Evaluation 01/26/22    Authorization Type UHC Medicare    Progress Note Due on Visit 10    SLP Start Time 1315    SLP Stop Time  23    SLP Time Calculation (min) 43 min    Activity Tolerance Patient tolerated treatment well                 Past Medical History:  Diagnosis Date   Ankle fracture    Left   Anxiety    Carotid artery occlusion    Closed fracture of left distal fibula 11/17/1538   Complication of anesthesia    ALLERY TO ESTER BASE   Depression    early 63s   Diabetes mellitus    INSULIN DEPENDENT   GERD (gastroesophageal reflux disease)    Headache    years ago   Hypertension    Pneumonia    Stroke [facility]) March 2015    left MCA infarct, slight weakness on left side   Thyroid disease    Past Surgical History:  Procedure Laterality Date   ABDOMINAL HYSTERECTOMY     ANTERIOR FIXATION AND POSTERIOR MICRODISCECTOMY CERVICAL SPINE  1999   CAROTID ENDARTERECTOMY Left 11-04-13   cea   ENDARTERECTOMY Left 11/04/2013   IR ANGIO INTRA EXTRACRAN SEL COM CAROTID INNOMINATE BILAT MOD SED  11/16/2021   IR ANGIO INTRA EXTRACRAN SEL COM CAROTID INNOMINATE UNI L MOD SED  11/19/2021   IR ANGIO VERTEBRAL SEL VERTEBRAL BILAT MOD SED  11/16/2021   IR CT HEAD LTD  11/19/2021   IR INTRAVSC STENT CERV CAROTID W/EMB-PROT MOD SED INCL ANGIO  11/19/2021   IR RADIOLOGIST EVAL & MGMT  12/13/2021   ORIF ANKLE FRACTURE Left 09/16/2017   Procedure: OPEN REDUCTION INTERNAL FIXATION (ORIF) LEFT ANKLE FRACTURE;  Surgeon: Marchia Bond, MD;  Location: [place];  Service: Orthopedics;  Laterality: Left;   RADIOLOGY WITH ANESTHESIA N/A 11/19/2021   Procedure: Left  carotid angioplasty with possible stenting;  Surgeon: Luanne Bras, MD;  Location: [place];  Service: Radiology;  Laterality: N/A;   Patient Active Problem List   Diagnosis Date Noted   Deficiency anemia 11/26/2021   Multinodular goiter 11/02/2021   Encounter for general adult medical examination with abnormal findings 04/08/2021   Stage 3b chronic kidney disease ([place]) 04/02/2020   Visit for screening mammogram 04/01/2020   Type 2 diabetes mellitus with complication, with long-term current use of insulin ([place]) 09/18/2018   Age-related osteoporosis with current pathological fracture 02/20/2018   Intractable migraine without aura and with status migrainosus 11/24/2016   GERD (gastroesophageal reflux disease) 03/08/2016   Major depressive disorder, recurrent episode ([place]) 02/20/2016   Occlusion and stenosis of carotid artery with cerebral infarction 11/25/2013   Cerebrovascular accident (CVA) due to stenosis of left middle cerebral artery ([place]) 11/02/2013   Vitamin D deficiency 10/19/2013   Tobacco abuse 07/31/2013   Essential hypertension, benign 04/20/2013   Type II diabetes mellitus with manifestations ([place]) 04/20/2013   Hypothyroidism 04/20/2013   Hyperlipidemia with target LDL less than 70 04/20/2013    ONSET DATE: 11/13/2021  REFERRING DIAG: G86.761 (ICD-10-CM) -  Cerebrovascular accident (CVA) due to stenosis of left middle cerebral artery ([place])   THERAPY DIAG:  Cognitive communication deficit  SUBJECTIVE: "I tried to have my appointment made over here but they said I have to call my PCP"  PAIN:  Are you having pain? No  SPEECH THERAPY PROGRESS NOTE  Dates of Reporting Period: 12/01/2021 to 01/07/2022  Objective Reports of Subjective Statement: Pt has been seen for 10 ST visits addressing word finding and cognitive deficits of attention, memory and executive functioning. Pt endorses targeted strategies has been helpful and she has experienced mild improvements in cognitive  functioning and decreasing occurences of word-finding difficulties.   Objective Measurements: Pt with no overt word finding deficits observed across therapy sessions. Moderate improvement in cognitive impairment, notable for increased abilities to perform daily activities with less support from sister.   Goal Update: see below  Plan: continue per POC  Reason Skilled Services are Required: Skilled ST is indicated to facilitate use of strategies and compensations to mitigate negative impacts on impaired cognitive function.    OBJECTIVE:   TODAY'S TREATMENT:  01-07-22: P[place] Plate discussion with Stanton Kidney regarding her ability to manage husband's medications, appointments, living situation but not her own. Affirm that pt is cognitively able to complete these things, she appears to be lacking initiation or motivation. Pt endorses this. Encourage pt to continue working to find a therapist to work on Child psychotherapist and depression. Target organization and planning through instruction of using planner to write down to-do lists vice numerous papers with various lists. Pt verbalizes understanding, scheduled x4 items into calendar.   01-05-22: Target use of alarm to recall medication and blood pressure/glucose management. ID breakdown occurring in ignoring set alarms, then forgetting. Use of errorless learning and spaced retrieval with using alarm. Usual mod-A to go through steps of checking blood sugar and verbalizing steps of annotating notes of actions taken. Generated strategy to address stress at home with impaired ability to express thoughts. Pt to write down thoughts/feelings, create a bullet point list to guide discussion and support conversation with sister. Sorted pt's medications into pill box. Usual mod-A to ID errors of double filling. 100% accuracy in recall of which medications are taken at which time of day.   12-31-21: Pt again asked about neurology referral (discussed last session). SLP wrote down  information to aid recall and attention of this information. Pt concerned about word finding; however, in extensive 20+ minute conversation, no overt word finding or atypical pausing/halting noted. SLP affirmed patient is clearly compensating and repairing any possible communication breakdown effectively as this is not noticeable for trained listener. SLP provided recommendations to aid possible word finding for upcoming church meeting, including rehearsal and scripting as needed. Pt c/o difficulty managing medications (alarms not consistently effective as pt ignores them) and recalling to plug in devices. SLP suggested establishment of routine (associating medications/plugging in device) and use of visual aids. SLP trialed Reminder app in phone, in which pt reported this may be beneficial. Additional education and training to be completed next session.    PATIENT EDUCATION: Education details: see above Person educated: Patient Education method: Consulting civil engineer, Demonstration, and Handouts Education comprehension: verbalized understanding, returned demonstration, and needs further education  HOME EDUCATION PROGRAM:   to-do list, use alarms for medication management, GPDR for scheduling appointments     GOALS: Goals reviewed with patient? Yes   SHORT TERM GOALS: Target date: 12/30/2021   Pt will accurately implement medication management system with mod I over 2 sessions.  Baseline: 12-29-21 Goal status: Partially Met   2.  Pt will teach back x3 attention and memory compensations/strategies, and ID functional use for at home, with occasional min-A Baseline: 12/23/21 Goal status: Partially Met   3.  Pt will verbalize x2 anomia compensations she can implement to reduce feelings of frustration when word finding difficulty occurs with occasional min-A Baseline: 12/23/21, 12-29-21 Goal status: Met   LONG TERM GOALS: Target date: 02/24/2022   Pt will report improved cognitive linguistic functioning  via PROM by 2 points at last ST session Baseline:  Goal status: ongoing   2.  Pt will report successful implementation of medication management system with no missed doses over 1 week period  Baseline:  Goal status: ongoing   3.  Pt will use appropriate external aids for recall and management of finances, schedules, appointments, to-do's with occasional min A over 1 week Baseline:  Goal status: ongoing   4.  Pt will demonstrates [facility] verbal expression during mod-complex conversation of at least 15 minutes Baseline:  Goal status: ongoing     ASSESSMENT:   CLINICAL IMPRESSION: Patient is a 71 y.o. F who was seen today for cognitive communication changes s/p left ICA stent placement. Pt currently requiring moderate amount of assistance from sister in managing iADLs such as financial management, medication management, cooking, shopping, schedule management.  Deficits appear to be impacting pt's QoL d/t increased anxiety around difficulties, decreased participation in meaningful activities, and reliance on sister for assistance. Conducted ongoing education and training of memory attention compensations and word retrieval strategies to maximize functional independence with inconsistent implementation reported. I recommend skilled ST to address functional impairments 2/2 cognitive communication deficits in areas of memory, attention, executive functioning. Additionally will provide pt with anomia strategies for when reported aphasia-like events occur.    OBJECTIVE IMPAIRMENTS include attention, memory, executive functioning, expressive language, and dysarthria. These impairments are limiting patient from managing medications, managing appointments, managing finances, household responsibilities, and effectively communicating at home and in community. No overt factors affecting potential to achieve goals and functional outcomes observed or reported this date. Patient will benefit from skilled SLP  services to address above impairments and improve overall function.   REHAB POTENTIAL: Good   PLAN: SLP FREQUENCY: 2x/week   SLP DURATION: 8 weeks   PLANNED INTERVENTIONS: Language facilitation, Environmental controls, Cueing hierachy, Cognitive reorganization, Internal/external aids, Functional tasks, Multimodal communication approach, and SLP instruction and feedback  Su Monks, CCC-SLP 01/07/2022, 1:15 PM

## 2022-01-12 ENCOUNTER — Encounter: Payer: Medicare Other | Admitting: Occupational Therapy

## 2022-01-12 ENCOUNTER — Encounter: Payer: Self-pay | Admitting: Internal Medicine

## 2022-01-12 ENCOUNTER — Ambulatory Visit: Payer: Medicare Other | Admitting: Speech Pathology

## 2022-01-12 DIAGNOSIS — R41841 Cognitive communication deficit: Secondary | ICD-10-CM

## 2022-01-12 DIAGNOSIS — R278 Other lack of coordination: Secondary | ICD-10-CM | POA: Diagnosis not present

## 2022-01-12 DIAGNOSIS — R2681 Unsteadiness on feet: Secondary | ICD-10-CM | POA: Diagnosis not present

## 2022-01-12 NOTE — Therapy (Signed)
OUTPATIENT SPEECH LANGUAGE PATHOLOGY TREATMENT NOTE   Patient Name: Sonia Skinner MRN: [id number] DOB:07/27/1951, 71 y.o., female Today's Date: 01/12/2022  PCP: [name], MD REFERRING PROVIDER: [name], MD  END OF SESSION:   End of Session - 01/12/22 1319     Visit Number 11    Number of Visits 9    Date for SLP Re-Evaluation 01/26/22    Authorization Type [employer] Medicare    Progress Note Due on Visit 10    SLP Start Time 1315    SLP Stop Time  1353    SLP Time Calculation (min) 38 min    Activity Tolerance Patient tolerated treatment well                  Past Medical History:  Diagnosis Date   Ankle fracture    Left   Anxiety    Carotid artery occlusion    Closed fracture of left distal fibula 03/22/5642   Complication of anesthesia    ALLERY TO ESTER BASE   Depression    early 40s   Diabetes mellitus    INSULIN DEPENDENT   GERD (gastroesophageal reflux disease)    Headache    years ago   Hypertension    Pneumonia    Stroke [facility]) March 2015    left MCA infarct, slight weakness on left side   Thyroid disease    Past Surgical History:  Procedure Laterality Date   ABDOMINAL HYSTERECTOMY     ANTERIOR FIXATION AND POSTERIOR MICRODISCECTOMY CERVICAL SPINE  1999   CAROTID ENDARTERECTOMY Left 11-04-13   cea   ENDARTERECTOMY Left 11/04/2013   IR ANGIO INTRA EXTRACRAN SEL COM CAROTID INNOMINATE BILAT MOD SED  11/16/2021   IR ANGIO INTRA EXTRACRAN SEL COM CAROTID INNOMINATE UNI L MOD SED  11/19/2021   IR ANGIO VERTEBRAL SEL VERTEBRAL BILAT MOD SED  11/16/2021   IR CT HEAD LTD  11/19/2021   IR INTRAVSC STENT CERV CAROTID W/EMB-PROT MOD SED INCL ANGIO  11/19/2021   IR RADIOLOGIST EVAL & MGMT  12/13/2021   ORIF ANKLE FRACTURE Left 09/16/2017   Procedure: OPEN REDUCTION INTERNAL FIXATION (ORIF) LEFT ANKLE FRACTURE;  Surgeon: Marchia Bond, MD;  Location: [place];  Service: Orthopedics;  Laterality: Left;   RADIOLOGY WITH ANESTHESIA N/A 11/19/2021   Procedure: Left  carotid angioplasty with possible stenting;  Surgeon: Luanne Bras, MD;  Location: [place];  Service: Radiology;  Laterality: N/A;   Patient Active Problem List   Diagnosis Date Noted   Deficiency anemia 11/26/2021   Multinodular goiter 11/02/2021   Encounter for general adult medical examination with abnormal findings 04/08/2021   Stage 3b chronic kidney disease ([place]) 04/02/2020   Visit for screening mammogram 04/01/2020   Type 2 diabetes mellitus with complication, with long-term current use of insulin ([place]) 09/18/2018   Age-related osteoporosis with current pathological fracture 02/20/2018   Intractable migraine without aura and with status migrainosus 11/24/2016   GERD (gastroesophageal reflux disease) 03/08/2016   Major depressive disorder, recurrent episode ([place]) 02/20/2016   Occlusion and stenosis of carotid artery with cerebral infarction 11/25/2013   Cerebrovascular accident (CVA) due to stenosis of left middle cerebral artery ([place]) 11/02/2013   Vitamin D deficiency 10/19/2013   Tobacco abuse 07/31/2013   Essential hypertension, benign 04/20/2013   Type II diabetes mellitus with manifestations ([place]) 04/20/2013   Hypothyroidism 04/20/2013   Hyperlipidemia with target LDL less than 70 04/20/2013    ONSET DATE: 11/13/2021  REFERRING DIAG: P29.518 (ICD-10-CM) -  Cerebrovascular accident (CVA) due to stenosis of left middle cerebral artery ([place])   THERAPY DIAG:  Cognitive communication deficit  SUBJECTIVE: "I'm feeling off today"  PAIN:  Are you having pain? No    OBJECTIVE:   TODAY'S TREATMENT:  01-12-22: Reports continued stress and depression relating to relationship with sister resulting in impact in functional abilities/successes. Education and recommendation re: mental healthy professionals role in care for dealing successfully with emotionality resulting from complex familial relationships. Targeted memory compensation of creating routine. Generated list of items  which creating routine would be helpful for remembering-- e.g. taking glucose measurement, writing down appointments Reviewed compensations for supporting difficult or complex conversations. Pt has attempted to write things down re: conversation with sister. Generated strategy of preempting conversation with disclaimer to allow for time to speak, rehearsing prior to conversation/phone call. Pt reports success of alarms for taking blood sugar and insulin.   5-25-23Pilar Plate discussion with [facility] regarding her ability to manage husband's medications, appointments, living situation but not her own. Affirm that pt is cognitively able to complete these things, she appears to be lacking initiation or motivation. Pt endorses this. Encourage pt to continue working to find a therapist to work on Child psychotherapist and depression. Target organization and planning through instruction of using planner to write down to-do lists vice numerous papers with various lists. Pt verbalizes understanding, scheduled x4 items into calendar.   01-05-22: Target use of alarm to recall medication and blood pressure/glucose management. ID breakdown occurring in ignoring set alarms, then forgetting. Use of errorless learning and spaced retrieval with using alarm. Usual mod-A to go through steps of checking blood sugar and verbalizing steps of annotating notes of actions taken. Generated strategy to address stress at home with impaired ability to express thoughts. Pt to write down thoughts/feelings, create a bullet point list to guide discussion and support conversation with sister. Sorted pt's medications into pill box. Usual mod-A to ID errors of double filling. 100% accuracy in recall of which medications are taken at which time of day.   12-31-21: Pt again asked about neurology referral (discussed last session). SLP wrote down information to aid recall and attention of this information. Pt concerned about word finding; however, in extensive  20+ minute conversation, no overt word finding or atypical pausing/halting noted. SLP affirmed patient is clearly compensating and repairing any possible communication breakdown effectively as this is not noticeable for trained listener. SLP provided recommendations to aid possible word finding for upcoming church meeting, including rehearsal and scripting as needed. Pt c/o difficulty managing medications (alarms not consistently effective as pt ignores them) and recalling to plug in devices. SLP suggested establishment of routine (associating medications/plugging in device) and use of visual aids. SLP trialed Reminder app in phone, in which pt reported this may be beneficial. Additional education and training to be completed next session.    PATIENT EDUCATION: Education details: see above Person educated: Patient Education method: Consulting civil engineer, Demonstration, and Handouts Education comprehension: verbalized understanding, returned demonstration, and needs further education  HOME EDUCATION PROGRAM:   to-do list, use alarms for medication management, GPDR for scheduling appointments     GOALS: Goals reviewed with patient? Yes   SHORT TERM GOALS: Target date: 12/30/2021   Pt will accurately implement medication management system with mod I over 2 sessions.  Baseline: 12-29-21 Goal status: Partially Met   2.  Pt will teach back x3 attention and memory compensations/strategies, and ID functional use for at home, with occasional min-A Baseline: 12/23/21  Goal status: Partially Met   3.  Pt will verbalize x2 anomia compensations she can implement to reduce feelings of frustration when word finding difficulty occurs with occasional min-A Baseline: 12/23/21, 12-29-21 Goal status: Met   LONG TERM GOALS: Target date: 02/24/2022   Pt will report improved cognitive linguistic functioning via PROM by 2 points at last ST session Baseline:  Goal status: ongoing   2.  Pt will report successful  implementation of medication management system with no missed doses over 1 week period  Baseline:  Goal status: ongoing   3.  Pt will use appropriate external aids for recall and management of finances, schedules, appointments, to-do's with occasional min A over 1 week Baseline:  Goal status: ongoing   4.  Pt will demonstrates [facility] verbal expression during mod-complex conversation of at least 15 minutes Baseline:  Goal status: ongoing     ASSESSMENT:   CLINICAL IMPRESSION: Patient is a 71 y.o. F who was seen today for cognitive communication changes s/p left ICA stent placement. Pt currently requiring moderate amount of assistance from sister in managing iADLs such as financial management, medication management, cooking, shopping, schedule management.  Deficits appear to be impacting pt's QoL d/t increased anxiety around difficulties, decreased participation in meaningful activities, and reliance on sister for assistance. Conducted ongoing education and training of memory attention compensations and word retrieval strategies to maximize functional independence with inconsistent implementation reported. I recommend skilled ST to address functional impairments 2/2 cognitive communication deficits in areas of memory, attention, executive functioning. Additionally will provide pt with anomia strategies for when reported aphasia-like events occur.    OBJECTIVE IMPAIRMENTS include attention, memory, executive functioning, expressive language, and dysarthria. These impairments are limiting patient from managing medications, managing appointments, managing finances, household responsibilities, and effectively communicating at home and in community. No overt factors affecting potential to achieve goals and functional outcomes observed or reported this date. Patient will benefit from skilled SLP services to address above impairments and improve overall function.   REHAB POTENTIAL: Good   PLAN: SLP  FREQUENCY: 2x/week   SLP DURATION: 8 weeks   PLANNED INTERVENTIONS: Language facilitation, Environmental controls, Cueing hierachy, Cognitive reorganization, Internal/external aids, Functional tasks, Multimodal communication approach, and SLP instruction and feedback  Su Monks, CCC-SLP 01/12/2022, 1:57 PM

## 2022-01-13 ENCOUNTER — Ambulatory Visit: Payer: Medicare Other | Admitting: Speech Pathology

## 2022-01-13 DIAGNOSIS — R2681 Unsteadiness on feet: Secondary | ICD-10-CM | POA: Diagnosis not present

## 2022-01-13 DIAGNOSIS — R41841 Cognitive communication deficit: Secondary | ICD-10-CM | POA: Diagnosis not present

## 2022-01-13 DIAGNOSIS — R278 Other lack of coordination: Secondary | ICD-10-CM | POA: Diagnosis not present

## 2022-01-13 NOTE — Therapy (Signed)
OUTPATIENT SPEECH LANGUAGE PATHOLOGY TREATMENT NOTE   Patient Name: Sonia Skinner MRN: [id number] DOB:August 09, 1951, 71 y.o., female Today's Date: 01/13/2022  PCP: [name], MD REFERRING PROVIDER: [name], MD  END OF SESSION:   End of Session - 01/13/22 1311     Visit Number 12    Number of Visits 31    Date for SLP Re-Evaluation 01/26/22    Authorization Type [employer] Medicare    Progress Note Due on Visit 10    SLP Start Time 1315    SLP Stop Time  1355    SLP Time Calculation (min) 40 min    Activity Tolerance Patient tolerated treatment well                   Past Medical History:  Diagnosis Date   Ankle fracture    Left   Anxiety    Carotid artery occlusion    Closed fracture of left distal fibula 04/24/3569   Complication of anesthesia    ALLERY TO ESTER BASE   Depression    early 22s   Diabetes mellitus    INSULIN DEPENDENT   GERD (gastroesophageal reflux disease)    Headache    years ago   Hypertension    Pneumonia    Stroke [facility]) March 2015    left MCA infarct, slight weakness on left side   Thyroid disease    Past Surgical History:  Procedure Laterality Date   ABDOMINAL HYSTERECTOMY     ANTERIOR FIXATION AND POSTERIOR MICRODISCECTOMY CERVICAL SPINE  1999   CAROTID ENDARTERECTOMY Left 11-04-13   cea   ENDARTERECTOMY Left 11/04/2013   IR ANGIO INTRA EXTRACRAN SEL COM CAROTID INNOMINATE BILAT MOD SED  11/16/2021   IR ANGIO INTRA EXTRACRAN SEL COM CAROTID INNOMINATE UNI L MOD SED  11/19/2021   IR ANGIO VERTEBRAL SEL VERTEBRAL BILAT MOD SED  11/16/2021   IR CT HEAD LTD  11/19/2021   IR INTRAVSC STENT CERV CAROTID W/EMB-PROT MOD SED INCL ANGIO  11/19/2021   IR RADIOLOGIST EVAL & MGMT  12/13/2021   ORIF ANKLE FRACTURE Left 09/16/2017   Procedure: OPEN REDUCTION INTERNAL FIXATION (ORIF) LEFT ANKLE FRACTURE;  Surgeon: Marchia Bond, MD;  Location: [place];  Service: Orthopedics;  Laterality: Left;   RADIOLOGY WITH ANESTHESIA N/A 11/19/2021   Procedure: Left  carotid angioplasty with possible stenting;  Surgeon: Luanne Bras, MD;  Location: [place];  Service: Radiology;  Laterality: N/A;   Patient Active Problem List   Diagnosis Date Noted   Deficiency anemia 11/26/2021   Multinodular goiter 11/02/2021   Encounter for general adult medical examination with abnormal findings 04/08/2021   Stage 3b chronic kidney disease ([place]) 04/02/2020   Visit for screening mammogram 04/01/2020   Type 2 diabetes mellitus with complication, with long-term current use of insulin ([place]) 09/18/2018   Age-related osteoporosis with current pathological fracture 02/20/2018   Intractable migraine without aura and with status migrainosus 11/24/2016   GERD (gastroesophageal reflux disease) 03/08/2016   Major depressive disorder, recurrent episode ([place]) 02/20/2016   Occlusion and stenosis of carotid artery with cerebral infarction 11/25/2013   Cerebrovascular accident (CVA) due to stenosis of left middle cerebral artery ([place]) 11/02/2013   Vitamin D deficiency 10/19/2013   Tobacco abuse 07/31/2013   Essential hypertension, benign 04/20/2013   Type II diabetes mellitus with manifestations ([place]) 04/20/2013   Hypothyroidism 04/20/2013   Hyperlipidemia with target LDL less than 70 04/20/2013    ONSET DATE: 11/13/2021  REFERRING DIAG: V77.939 (  ICD-10-CM) - Cerebrovascular accident (CVA) due to stenosis of left middle cerebral artery ([place])   THERAPY DIAG:  Cognitive communication deficit  SUBJECTIVE: "I'm ok"  PAIN:  Are you having pain? No  SPEECH THERAPY DISCHARGE SUMMARY  Visits from Start of Care: 12  Current functional level related to goals / functional outcomes: Increased independence in financial, schedule, and medication management through use of targeted strategies and compensations. No overt word finding demonstrated across therapy sessions. Education provided on strategy use of similar words and descriptions.    Remaining deficits: Continues to  present with slow processing, memory, and attention impairments but is using compensations and strategies to mitigate negative impacts on functional status. Primary barrier at this time appears related to motivation and initiation. Pt reports increased fatigue and stress which could be impacting accomplishing daily activities. Does have appointment scheduled with psychotherapist.    Education / Equipment: Attention and memory compensations, word finding strategies, external cognitive assistive technology   Patient agrees to discharge. Patient goals were met. Patient is being discharged due to being pleased with the current functional level..      OBJECTIVE:   TODAY'S TREATMENT:  01-13-22: Pt reports increased independence with medication, schedule, and financial management. Continues to use alarms to aid in medication management. Pt reports success with using auto-bill pay as primary means of paying bills. Has been using planner to write down daily to-do lists with some difficulty. Generated strategy of creating routine around writing out to-do list and referencing planner. Pt reports incorporating into morning routine would be most beneficial. Discussion re: overall cognitive functioning as it relates to managing daily activities. Primary barrier at this time appears to be related to emotional state, pt pending appointment with psychotherapist. Improvement reported via cognitive functioning PROM. Pt inquires about word finding, telling ST this happens frequently. ST endorses understanding of frustration, assures pt she is compensating well through use of targeted strategies of pausing, allowing time, using similar word, and describing. No communication breakdowns observed this date, no overt word finding evidenced. Pt pleased with current level of functioning and is agreeable to d/c.   01-12-22: Reports continued stress and depression relating to relationship with sister resulting in impact in  functional abilities/successes. Education and recommendation re: mental healthy professionals role in care for dealing successfully with emotionality resulting from complex familial relationships. Targeted memory compensation of creating routine. Generated list of items which creating routine would be helpful for remembering-- e.g. taking glucose measurement, writing down appointments Reviewed compensations for supporting difficult or complex conversations. Pt has attempted to write things down re: conversation with sister. Generated strategy of preempting conversation with disclaimer to allow for time to speak, rehearsing prior to conversation/phone call. Pt reports success of alarms for taking blood sugar and insulin.   5-25-23Pilar Plate discussion with [facility] regarding her ability to manage husband's medications, appointments, living situation but not her own. Affirm that pt is cognitively able to complete these things, she appears to be lacking initiation or motivation. Pt endorses this. Encourage pt to continue working to find a therapist to work on Child psychotherapist and depression. Target organization and planning through instruction of using planner to write down to-do lists vice numerous papers with various lists. Pt verbalizes understanding, scheduled x4 items into calendar.    PATIENT EDUCATION: Education details: see above Person educated: Patient Education method: Explanation, Demonstration, and Handouts Education comprehension: verbalized understanding, returned demonstration, and needs further education  HOME EDUCATION PROGRAM:   to-do list, use alarms for medication  management, GPDR for scheduling appointments     GOALS: Goals reviewed with patient? Yes   SHORT TERM GOALS: Target date: 12/30/2021   Pt will accurately implement medication management system with mod I over 2 sessions.  Baseline: 12-29-21 Goal status: Partially Met   2.  Pt will teach back x3 attention and memory  compensations/strategies, and ID functional use for at home, with occasional min-A Baseline: 12/23/21 Goal status: Partially Met   3.  Pt will verbalize x2 anomia compensations she can implement to reduce feelings of frustration when word finding difficulty occurs with occasional min-A Baseline: 12/23/21, 12-29-21 Goal status: Met   LONG TERM GOALS: Target date: 02/24/2022   Pt will report improved cognitive linguistic functioning via PROM by 2 points at last ST session Baseline: 21; 01/13/2022 34 Goal status: MET   2.  Pt will report successful implementation of medication management system with no missed doses over 1 week period  Baseline: 01/12/2022, 01/13/2022 Goal status: MET   3.  Pt will use appropriate external aids for recall and management of finances, schedules, appointments, to-do's with occasional min A over 1 week Baseline: 01/12/2022, 01/13/2022 Goal status: MET   4.  Pt will demonstrates [facility] verbal expression during mod-complex conversation of at least 15 minutes Baseline: 01/13/2022 Goal status: MET     ASSESSMENT:   CLINICAL IMPRESSION: Patient is a 71 y.o. F who was seen today for cognitive communication changes s/p left ICA stent placement. Pt currently requiring moderate amount of assistance from sister in managing iADLs such as financial management, medication management, cooking, shopping, schedule management.  Deficits appear to be impacting pt's QoL d/t increased anxiety around difficulties, decreased participation in meaningful activities, and reliance on sister for assistance. Conducted ongoing education and training of memory attention compensations and word retrieval strategies to maximize functional independence with inconsistent implementation reported. I recommend skilled ST to address functional impairments 2/2 cognitive communication deficits in areas of memory, attention, executive functioning. Additionally will provide pt with anomia strategies for when  reported aphasia-like events occur.    OBJECTIVE IMPAIRMENTS include attention, memory, executive functioning, expressive language, and dysarthria. These impairments are limiting patient from managing medications, managing appointments, managing finances, household responsibilities, and effectively communicating at home and in community. No overt factors affecting potential to achieve goals and functional outcomes observed or reported this date. Patient will benefit from skilled SLP services to address above impairments and improve overall function.   REHAB POTENTIAL: Good   PLAN: SLP FREQUENCY: 2x/week   SLP DURATION: 8 weeks   PLANNED INTERVENTIONS: Language facilitation, Environmental controls, Cueing hierachy, Cognitive reorganization, Internal/external aids, Functional tasks, Multimodal communication approach, and SLP instruction and feedback  Su Monks, CCC-SLP 01/13/2022, 1:19 PM

## 2022-01-14 ENCOUNTER — Encounter: Payer: Medicare Other | Admitting: Occupational Therapy

## 2022-01-14 ENCOUNTER — Encounter: Payer: Medicare Other | Admitting: Speech Pathology

## 2022-01-15 ENCOUNTER — Ambulatory Visit: Payer: Medicare Other | Admitting: *Deleted

## 2022-01-15 DIAGNOSIS — E118 Type 2 diabetes mellitus with unspecified complications: Secondary | ICD-10-CM

## 2022-01-15 DIAGNOSIS — I10 Essential (primary) hypertension: Secondary | ICD-10-CM

## 2022-01-15 NOTE — Chronic Care Management (AMB) (Signed)
Care Management    RN Visit Note  01/15/2022 Name: Sonia Skinner MRN: [id number] DOB: 06/22/1951  Subjective: Sonia Skinner is a 71 y.o. year old female who is a primary care Sonia of [name], MD. The care management team was consulted for assistance with disease management and care coordination needs.    Engaged with Sonia by telephone for follow up visit in response to provider referral for case management and/or care coordination services.   Consent to Services:   Sonia Skinner was given information about Care Management services 11/24/21 including:  Care Management services includes personalized support from designated clinical staff supervised by her physician, including individualized plan of care and coordination with other care providers 24/7 contact phone numbers for assistance for urgent and routine care needs. The Sonia may stop case management services at any time by phone call to the office staff.  Sonia agreed to services and consent obtained.   Assessment: Review of Sonia past medical history, allergies, medications, health status, including review of consultants reports, laboratory and other test data, was performed as part of comprehensive evaluation and provision of chronic care management services.  Care Plan  Allergies  Allergen Reactions   Anesthetics, Ester Anaphylaxis   Outpatient Encounter Medications as of 01/15/2022  Medication Sig   Accu-Chek FastClix Lancets MISC Use to check blood sugar five times daily.   acetaminophen (TYLENOL) 500 MG tablet Take 1,000-1,500 mg by mouth every 6 (six) hours as needed for mild pain.   amitriptyline (ELAVIL) 100 MG tablet Take 1 tablet (100 mg total) by mouth at bedtime.   amLODipine (NORVASC) 5 MG tablet Take 1 tablet (5 mg total) by mouth daily.   aspirin 81 MG chewable tablet Chew 1 tablet (81 mg total) by mouth daily.   atorvastatin (LIPITOR) 80 MG tablet Take 0.5 tablets (40 mg total) by mouth daily.    buPROPion (WELLBUTRIN XL) 300 MG 24 hr tablet Take 1 tablet (300 mg total) by mouth daily.   Cholecalciferol (VITAMIN D3 PO) Take 1 tablet by mouth daily. Unsure of dose   Continuous Blood Gluc Receiver (DEXCOM G7 RECEIVER) DEVI 1 Act by Does not apply route daily.   Continuous Blood Gluc Sensor (DEXCOM G7 SENSOR) MISC 1 Act by Does not apply route daily.   Continuous Blood Gluc Sensor (DEXCOM G7 SENSOR) MISC 1 Act by Does not apply route daily.   dexlansoprazole (DEXILANT) 60 MG capsule Take 1 capsule by mouth daily.   EDARBYCLOR 40-12.5 MG TABS Take 1 tablet by mouth daily.   EUTHYROX 88 MCG tablet Take 1 tablet by mouth once daily   ezetimibe (ZETIA) 10 MG tablet Take 1 tablet (10 mg total) by mouth daily.   FARXIGA 10 MG TABS tablet Take 1 tablet by mouth daily before breakfast.   gabapentin (NEURONTIN) 100 MG capsule Take 1 capsule (100 mg total) by mouth 3 (three) times daily. (Sonia taking differently: Take 100-200 mg by mouth See admin instructions. 200 mg int the morning 100 mg at bedtime)   insulin glargine, 1 Unit Dial, (TOUJEO SOLOSTAR) 300 UNIT/ML Solostar Pen Inject 26 Units into the skin daily.   insulin lispro (HUMALOG KWIKPEN) 200 UNIT/ML KwikPen Inject 5 Units into the skin 3 (three) times daily with meals.   Insulin Pen Needle 32G X 4 MM MISC Use to administer insulin four times a day   loratadine (CLARITIN) 10 MG tablet Take 1 tablet (10 mg total) by mouth daily.   Semaglutide, 2 MG/DOSE,  8 MG/3ML SOPN Inject 2 mg as directed once a week. (Sonia taking differently: Inject 2 mg as directed every 14 (fourteen) days.)   ticagrelor (BRILINTA) 90 MG TABS tablet Take 1 tablet (90 mg total) by mouth 2 (two) times daily.   No facility-administered encounter medications on file as of 01/15/2022.   Sonia Active Problem List   Diagnosis Date Noted   Deficiency anemia 11/26/2021   Multinodular goiter 11/02/2021   Encounter for general adult medical examination with abnormal  findings 04/08/2021   Stage 3b chronic kidney disease ([place]) 04/02/2020   Visit for screening mammogram 04/01/2020   Type 2 diabetes mellitus with complication, with long-term current use of insulin ([place]) 09/18/2018   Age-related osteoporosis with current pathological fracture 02/20/2018   Intractable migraine without aura and with status migrainosus 11/24/2016   GERD (gastroesophageal reflux disease) 03/08/2016   Major depressive disorder, recurrent episode ([place]) 02/20/2016   Occlusion and stenosis of carotid artery with cerebral infarction 11/25/2013   Cerebrovascular accident (CVA) due to stenosis of left middle cerebral artery ([place]) 11/02/2013   Vitamin D deficiency 10/19/2013   Tobacco abuse 07/31/2013   Essential hypertension, benign 04/20/2013   Type II diabetes mellitus with manifestations ([place]) 04/20/2013   Hypothyroidism 04/20/2013   Hyperlipidemia with target LDL less than 70 04/20/2013   Conditions to be addressed/monitored: HTN and DMII  Care Plan : RN Care Manager Plan of Care  Updates made by Knox Royalty, RN since 01/15/2022 12:00 AM     Problem: Chronic Disease Management Needs   Priority: High     Long-Range Goal: Ongoing adherence to established plan of care for long term chronic disease management   Start Date: 12/16/2021  Expected End Date: 12/17/2022  Priority: High  Note:   Current Barriers:  Chronic Disease Management support and education needs related to HTN and DMII Recent hospitalization 3/31--- 11/20/21 for TIA/ carotid stenosis- stent placed: discharged home to self care  RNCM Clinical Goal(s):  Sonia will demonstrate ongoing health management independence as evidenced by adherence to plan of care for self-health management of HTN/ DMII        through collaboration with RN Care manager, provider, and care team.   Interventions: 1:1 collaboration with primary care provider regarding development and update of comprehensive plan of care as evidenced by  provider attestation and co-signature Inter-disciplinary care team collaboration (see longitudinal plan of care) Evaluation of current treatment plan related to  self management and Sonia's adherence to plan as established by provider Review of Sonia status, including review of consultants reports, relevant laboratory and other test results, and medications completed SDOH updated: no new/ unmet concerns identified Pain assessment updated: reports recent increased severity of chronic low back pain; states she has not incurred a fall nor had any other injury; "it has just been hurting much worse than it usually does;" confirms she has a few doses of Tylenol #3 left that dr. Ronnald Ramp prescribed previously- verified Rx from 11/26/21 PCP office visit: she is currently taking these (half tab- 1-2 times per day) but thinks she will not have enough "for long;" she is asking if Dr. Ronnald Ramp would consider re-prescribing for her; we discussed regulations around prescribing pain medications, and I explained that I will make Dr. Ronnald Ramp aware of her request, but advised that she should make office visit with him, as she is also complaining about "much more tired and washed out feeling;" she reports she will schedule PCP office visit as I have advised Falls  assessment updated:  continues to deny new/ recent falls x 12 months- remains able to ambulate without use of assistive devices ;  positive reinforcement provided with encouragement to continue efforts at fall prevention; previously provided education around fall risks/ prevention reinforced Medications discussed: reports sister continues to assist with medication management; denies current concerns/ issues/ questions around medications; endorses adherence to taking all medications as prescribed Reviewed recent provider office visits 12/25/21- vascular provider, "got a good report;" 12/30/21- endocrinology provider: confirms she has decreased dose of Toujeo to "26 U every  day" as prescribed; reports she has not had to routinely use meal time insulin; confirms that she is getting better at using her CGM Revolving rehabilitation sessions for PT/ OT/ speech: reports all have now signed off; she had met her goals Reviewed upcoming scheduled provider appointments: 01/18/22- CCM CSW phone visit; 02/18/22- neurology provider; 02/25/22- PCP; 05/03/22- endocrinology provider; Sonia confirms is aware of all and has plans to attend as scheduled She is asking if her follow up with neurology provider post-recent CVA "is soon enough;" I confirmed that she has had recent provider office visits post-recent CVA with PCP, vascular provider, and endocrinology provider, as well as her rehabilitation  sessions: I advised that it is fine to keep scheduled neurology provider appointment for 02/18/22-- however, also advised that she could certainly call the office to see if they have a sooner appointment or to be placed on appointment cancellation list-- she will consider Discussed plans with Sonia for ongoing care management follow up and provided Sonia with direct contact information for care management team     Diabetes/ HTN:  (Status: 01/15/22: Goal on Track (progressing): YES.) Long Term Goal   Lab Results  Component Value Date   HGBA1C 7.8 (H) 11/14/2021    Provided education to Sonia about basic DM disease process; Reviewed prescribed diet with Sonia heart healthy, low salt, low cholesterol, carbohydrate modified/ low sugar; Counseled on importance of regular laboratory monitoring as prescribed;        Confirmed Sonia continues using/ learning her CGM-- she is enjoying using it and especially likes that it is connected to her endocrinology provider Confirms that her questions around insulin dosing were addressed at 12/30/21 endocrinology provider office visit- she is able to verbalize accurate understanding of changes: taking 26 U Toujeo; reports has not needed to use meal-time  insulin "hardly at all," and states her blood sugars have not been > 180 on a regular basis  Reviewed recent blood sugars at home: reports all values generally improved since she had endocrinology provider office visit; she does not have specific values for review over time; said she has not been following long term ranges as much since has been using CGM; and since her CGM data is being downloaded at endocrinology provider team office Confirmed no recent low blood sugars at home: she reports "it happens occasionally, not often, but rarely;" states it will "dip down to the 60's, 50's and I have even seen 40's;" confirms she has shared this with endocrinology provider; provided education around signs/ symptoms and corresponding action plan for hypoglycemia- she has good general baseline knowledge of same Evaluation of current treatment plan related to hypertension self management and Sonia's adherence to plan as established by provider Confirmed Sonia monitors/ records blood pressures at home weekly: reports "not checking as often" while she was attending rehabilitation sessions-- reports all recent blood pressures "have been in good range;" does not have specific values to report today; encouraged her ongoing  home monitoring of blood pressures Reinforced previously provided education around importance of blood pressure control in preventing complications of poorly controlled blood pressure such as heart disease, stroke, circulatory complications, vision complications, kidney impairment, sexual dysfunction  Confirmed Sonia is adherent to following recommended diet for DMII/ HTN: she is trying to continue to eat right and learn more about nutrition for better blood sugar and HTN control- positive reinforcement provided with encouragement to continue efforts   Last practice recorded BP readings:  BP Readings from Last 3 Encounters:  12/30/21 128/82  12/25/21 (!) 117/56  11/26/21 126/78   Sonia  Goals/Self-Care Activities: As evidenced by review of EHR, collaboration with care team, and Sonia reporting during CCM RN CM outreach,   Sonia Skinner will: Take medications as prescribed Attend all scheduled provider appointments Call pharmacy for medication refills Call provider office for new concerns or questions Continue to check fasting (first thing in the morning, before eating) blood sugars at home; check your blood sugars periodically throughout the day using your continuous glucose monitor Ask your endocrinology provider to place a referral to the [place] if you would like to have additional training in the use of your continuous glucose monitor-- or-- if you would like to have instructions about the best diet to follow when you have Diabetes Continue to follow heart healthy, low salt, low cholesterol, carbohydrate-modified, low sugar diet Continue to regularly monitor your blood pressures at home-- write these values down on paper so we can review them during our next telephone appointment Consider making an appointment with Dr. Ronnald Ramp to discuss your back pain that has worsened and the increased fatigue you are experiencing Keep up the great work preventing falls!    Plan:  Telephone follow up appointment with care management team member scheduled for: Monday, February 22, 2022 at 2:15 pm The Sonia has been provided with contact information for the care management team and has been advised to call with any health related questions or concerns   Oneta Rack, RN, BSN, [place] [phone]: direct office

## 2022-01-15 NOTE — Patient Instructions (Signed)
Visit Information  Sonia Skinner, thank you for taking time to talk with me today. Please don't hesitate to contact me if I can be of assistance to you before our next scheduled telephone appointment  Below are the goals we discussed today:  Patient Self-Care Activities: Patient Sonia Skinner will: Take medications as prescribed Attend all scheduled provider appointments Call pharmacy for medication refills Call provider office for new concerns or questions Continue to check fasting (first thing in the morning, before eating) blood sugars at home; check your blood sugars periodically throughout the day using your continuous glucose monitor Ask your endocrinology provider to place a referral to the [place] if you would like to have additional training in the use of your continuous glucose monitor-- or-- if you would like to have instructions about the best diet to follow when you have Diabetes Continue to follow heart healthy, low salt, low cholesterol, carbohydrate-modified, low sugar diet Continue to regularly monitor your blood pressures at home-- write these values down on paper so we can review them during our next telephone appointment Consider making an appointment with Dr. Ronnald Ramp to discuss your back pain that has worsened and the increased fatigue you are experiencing Keep up the great work preventing falls!  Our next scheduled telephone follow up visit/ appointment is scheduled on: Monday, February 22, 2022 at 2:15 pm- This is a PHONE [place] appointment  If you need to cancel or re-schedule our visit, please call [phone] and our care guide team will be happy to assist you.   I look forward to hearing about your progress.   Oneta Rack, RN, BSN, [place] [phone]: direct office  If you are experiencing a Mental Health or [place] or need someone to talk to, please  call the Suicide and Crisis  Lifeline: 988 call the [place] National Suicide Prevention Lifeline: 810-277-4116 or TTY: (713) 109-4623 TTY 870 291 8540) to talk to a trained counselor call 1-800-273-TALK (toll free, 24 hour hotline) go to [facility] [address] [place] [phone]) call 911   Patient verbalizes understanding of instructions and care plan provided today and agrees to view in [place]. Active MyChart status and patient understanding of how to access instructions and care plan via MyChart confirmed with patient    Telephone follow up appointment with care management team member scheduled for:  Monday, February 22, 2022 at 2:15 pm The patient has been provided with contact information for the care management team and has been advised to call with any health related questions or concerns   Chronic Back Pain When back pain lasts longer than 3 months, it is called chronic back pain. The cause of your back pain may not be known. Some common causes include: Wear and tear (degenerative disease) of the bones, ligaments, or disks in your back. Inflammation and stiffness in your back (arthritis). People who have chronic back pain often go through certain periods in which the pain is more intense (flare-ups). Many people can learn to manage the pain with home care. Follow these instructions at home: Pay attention to any changes in your symptoms. Take these actions to help with your pain: Managing pain and stiffness     If directed, apply ice to the painful area. Your health care provider may recommend applying ice during the first 24-48 hours after a flare-up begins. To do this: Put ice in a plastic bag. Place a towel between your skin  and the bag. Leave the ice on for 20 minutes, 2-3 times per day. If directed, apply heat to the affected area as often as told by your health care provider. Use the heat source that your health care provider recommends, such as a moist heat pack or  a heating pad. Place a towel between your skin and the heat source. Leave the heat on for 20-30 minutes. Remove the heat if your skin turns bright red. This is especially important if you are unable to feel pain, heat, or cold. You may have a greater risk of getting burned. Try soaking in a warm tub. Activity  Avoid bending and other activities that make the problem worse. Maintain a proper position when standing or sitting: When standing, keep your upper back and neck straight, with your shoulders pulled back. Avoid slouching. When sitting, keep your back straight and relax your shoulders. Do not round your shoulders or pull them backward. Do not sit or stand in one place for long periods of time. Take brief periods of rest throughout the day. This will reduce your pain. Resting in a lying or standing position is usually better than sitting to rest. When you are resting for longer periods, mix in some mild activity or stretching between periods of rest. This will help to prevent stiffness and pain. Get regular exercise. Ask your health care provider what activities are safe for you. Do not lift anything that is heavier than 10 lb (4.5 kg), or the limit that you are told, until your health care provider says that it is safe. Always use proper lifting technique, which includes: Bending your knees. Keeping the load close to your body. Avoiding twisting. Sleep on a firm mattress in a comfortable position. Try lying on your side with your knees slightly bent. If you lie on your back, put a pillow under your knees. Medicines Treatment may include medicines for pain and inflammation taken by mouth or applied to the skin, prescription pain medicine, or muscle relaxants. Take over-the-counter and prescription medicines only as told by your health care provider. Ask your health care provider if the medicine prescribed to you: Requires you to avoid driving or using machinery. Can cause constipation.  You may need to take these actions to prevent or treat constipation: Drink enough fluid to keep your urine pale yellow. Take over-the-counter or prescription medicines. Eat foods that are high in fiber, such as beans, whole grains, and fresh fruits and vegetables. Limit foods that are high in fat and processed sugars, such as fried or sweet foods. General instructions Do not use any products that contain nicotine or tobacco, such as cigarettes, e-cigarettes, and chewing tobacco. If you need help quitting, ask your health care provider. Keep all follow-up visits as told by your health care provider. This is important. Contact a health care provider if: You have pain that is not relieved with rest or medicine. Your pain gets worse, or you have new pain. You have a high fever. You have rapid weight loss. You have trouble doing your normal activities. Get help right away if: You have weakness or numbness in one or both of your legs or feet. You have trouble controlling your bladder or your bowels. You have severe back pain and have any of the following: Nausea or vomiting. Pain in your abdomen. Shortness of breath or you faint. Summary Chronic back pain is back pain that lasts longer than 3 months. When a flare-up begins, apply ice to the painful area  for the first 24-48 hours. Apply a moist heat pad or use a heating pad on the painful area as directed by your health care provider. When you are resting for longer periods, mix in some mild activity or stretching between periods of rest. This will help to prevent stiffness and pain. This information is not intended to replace advice given to you by your health care provider. Make sure you discuss any questions you have with your health care provider. Document Revised: 09/12/2019 Document Reviewed: 09/12/2019 Elsevier Patient Education  [place].

## 2022-01-18 ENCOUNTER — Ambulatory Visit: Payer: Medicare Other | Admitting: Licensed Clinical Social Worker

## 2022-01-18 DIAGNOSIS — F332 Major depressive disorder, recurrent severe without psychotic features: Secondary | ICD-10-CM

## 2022-01-18 NOTE — Patient Instructions (Signed)
Visit Information  Thank you for taking time to visit with me today. Please don't hesitate to contact me if I can be of assistance to you before our next scheduled telephone appointment.  Following are the goals we discussed today: managing mental health Task & activities to accomplish goals: Review your EMMI educational information (Managing Anxiety Around Daily Tasks; Depression; and Breathing to Relax) Look for an e-mail from [facility]. Review attached educational information in your after visit summary Emotional health after a Stroke Cognitive Rehabilitation after a Stroke  Our next appointment is by telephone on July 3rd at 2:00  Please call the care guide team at [phone] if you need to cancel or reschedule your appointment.   If you are experiencing a Mental Health or [place] or need someone to talk to, please call the Suicide and Crisis Lifeline: 988 call 1-800-273-TALK (toll free, 24 hour hotline) go to [facility] [address] [place] [phone])   Patient verbalizes understanding of instructions and care plan provided today and agrees to view in [place]. Active MyChart status and patient understanding of how to access instructions and care plan via MyChart confirmed with patient.     [name], LCSW Licensed Clinical Social Worker [name]  [place]  [phone]

## 2022-01-18 NOTE — Chronic Care Management (AMB) (Signed)
Care Management Clinical Social Work Note  01/18/2022 Name: Sonia Skinner MRN: [id number] DOB: 16-Aug-1951  Sonia Skinner is a 71 y.o. year old female who is a primary care patient of Sonia Lima, MD.  The Care Management team was consulted for assistance with chronic disease management and coordination needs.  Engaged with patient by telephone for follow up visit in response to provider referral for social work chronic care management and care coordination services  Consent to Services:  [name] was given information about Care Management services today including:  Care Management services includes personalized support from designated clinical staff supervised by her physician, including individualized plan of care and coordination with other care providers 24/7 contact phone numbers for assistance for urgent and routine care needs. The patient may stop case management services at any time by phone call to the office staff.  Patient agreed to services and consent obtained.   Summary: Patient is making progress, but continues to have difficulty with remembering things and managing her mental health.  She has decided to wait for the appointment with Dr. Lala Lund at [place]. See Care Plan below for interventions and patient self-care actives.  Recommendation: Patient may benefit from, and is in agreement to review EMMI educational multi media to assist with managing her systems until she is connect with Dr. Michail Sermon for psychotherapy.    Follow up Plan: Patient would like continued follow-up from CCM LCSW.  per patient's request will follow up in 30 days.  Will call office if needed prior to next encounter.   Assessment: Review of patient past medical history, allergies, medications, and health status, including review of relevant consultants reports was performed today as part of a comprehensive evaluation and provision of chronic care management and care coordination  services.  SDOH (Social Determinants of Health) assessments and interventions performed:    Advanced Directives Status: Not addressed in this encounter.  Care Plan Conditions to be addressed/monitored: Depression;   Care Plan : LCSW Plan of Care  Updates made by [name], LCSW since 01/18/2022 12:00 AM     Problem: Coping Skills      Goal: Coping Skills Enhanced   Start Date: 12/02/2021  This Visit's Progress: On track  Recent Progress: On track  Priority: High  Note:   Current Barriers:  Disease Management support and education needs related to Depression: anxiety Stress  and concern with memory loss and difficulty with organizing thoughts    CSW Clinical Goal(s):  Patient  will verbalize understanding of plan for management of Depression  through collaboration with Clinical Social Worker, provider, and care team.   Interventions: Inter-disciplinary care team collaboration (see longitudinal plan of care) Evaluation of current treatment plan related to  self management and patient's adherence to plan as established by provider  Mental Health:  (Status: Goal on Track (progressing): YES.) Evaluation of current treatment plan related to Depression: anxiety Solution-Focused Strategies employed:  Problem [name] strategies reviewed Has appointment with [facility] June 29th Provided EMMI education information on Managing Anxiety Around Daily Tasks; Depression; and Breathing to Relax  Task & activities to accomplish goals: Review your EMMI educational information (Managing Anxiety Around Daily Tasks; Depression; and Breathing to Relax) Look for an e-mail from [employer]. Review attached educational information in your after visit summary Emotional health after a Stroke Cognitive Rehabilitation after a Stroke     [name], LCSW Licensed Clinical Social Worker [name] Artist  [place]  [phone]

## 2022-01-19 ENCOUNTER — Ambulatory Visit: Payer: Medicare Other | Admitting: Speech Pathology

## 2022-01-19 ENCOUNTER — Encounter: Payer: Self-pay | Admitting: Family Medicine

## 2022-01-19 ENCOUNTER — Encounter: Payer: Medicare Other | Admitting: Occupational Therapy

## 2022-01-19 ENCOUNTER — Other Ambulatory Visit: Payer: Self-pay | Admitting: Internal Medicine

## 2022-01-19 ENCOUNTER — Telehealth: Payer: Self-pay

## 2022-01-19 ENCOUNTER — Ambulatory Visit (INDEPENDENT_AMBULATORY_CARE_PROVIDER_SITE_OTHER): Payer: Medicare Other | Admitting: Family Medicine

## 2022-01-19 VITALS — BP 128/64 | HR 82 | Temp 97.6°F | Ht 62.0 in | Wt 132.0 lb

## 2022-01-19 DIAGNOSIS — M545 Low back pain, unspecified: Secondary | ICD-10-CM | POA: Diagnosis not present

## 2022-01-19 MED ORDER — HYDROCODONE-ACETAMINOPHEN 5-325 MG PO TABS: 1.0000 | ORAL_TABLET | Freq: Four times a day (QID) | ORAL | 0 refills | Status: DC | PRN

## 2022-01-19 NOTE — Patient Instructions (Signed)
Use heat or ice on your low back.  You can do this 2-3 times per day for approximately 15 to 20 minutes each time.  Do gentle stretching as discussed.  I also recommend using a topical pain medication over-the-counter such as Salonpas with lidocaine, Biofreeze or you can ask the pharmacist about other options.  Take Tylenol as you have been for pain.  We will be in touch in regards to additional pain medication.

## 2022-01-19 NOTE — Progress Notes (Signed)
Subjective:     Patient ID: Sonia Skinner, female    DOB: [birth year], 71 y.o.   MRN: [id number]  Chief Complaint  Patient presents with   Back Pain    Has been going on for about a week or so. Cannot stand up without her lower back hurting   Constipation    Has been going on for several months    HPI Patient is in today for bilateral low back pain x 2 months. No injury. No falls.  Sitting, bending over and laying are fine. Pain is worse with prolonged standing.   Taking Tylenol #3 with codeine for pain and requests a refill.   Denies fever, chills, dizziness, chest pain, palpitations, shortness of breath, abdominal pain, N/V/D, urinary symptoms, LE edema.   Hx of stroke in March.   Reports ongoing fatigue.      Health Maintenance Due  Topic Date Due   Zoster Vaccines- Shingrix (1 of 2) Never done   COVID-19 Vaccine (5 - Booster for [employer] series) 04/02/2021    Past Medical History:  Diagnosis Date   Ankle fracture    Left   Anxiety    Carotid artery occlusion    Closed fracture of left distal fibula 1/0/2725   Complication of anesthesia    ALLERY TO ESTER BASE   Depression    early 30s   Diabetes mellitus    INSULIN DEPENDENT   GERD (gastroesophageal reflux disease)    Headache    years ago   Hypertension    Pneumonia    Stroke [facility]) March 2015    left MCA infarct, slight weakness on left side   Thyroid disease     Past Surgical History:  Procedure Laterality Date   ABDOMINAL HYSTERECTOMY     ANTERIOR FIXATION AND POSTERIOR MICRODISCECTOMY CERVICAL SPINE  1999   CAROTID ENDARTERECTOMY Left 11-04-13   cea   ENDARTERECTOMY Left 11/04/2013   IR ANGIO INTRA EXTRACRAN SEL COM CAROTID INNOMINATE BILAT MOD SED  11/16/2021   IR ANGIO INTRA EXTRACRAN SEL COM CAROTID INNOMINATE UNI L MOD SED  11/19/2021   IR ANGIO VERTEBRAL SEL VERTEBRAL BILAT MOD SED  11/16/2021   IR CT HEAD LTD  11/19/2021   IR INTRAVSC STENT CERV CAROTID W/EMB-PROT MOD SED INCL ANGIO  11/19/2021   IR  RADIOLOGIST EVAL & MGMT  12/13/2021   ORIF ANKLE FRACTURE Left 09/16/2017   Procedure: OPEN REDUCTION INTERNAL FIXATION (ORIF) LEFT ANKLE FRACTURE;  Surgeon: Marchia Bond, MD;  Location: [place];  Service: Orthopedics;  Laterality: Left;   RADIOLOGY WITH ANESTHESIA N/A 11/19/2021   Procedure: Left carotid angioplasty with possible stenting;  Surgeon: Luanne Bras, MD;  Location: [place];  Service: Radiology;  Laterality: N/A;    Family History  Problem Relation Age of Onset   Diabetes Mother    Heart disease Mother        Before age 71   Cancer Father        Lung   Hypertension Sister    Diabetes Sister    Diabetes Sister    Diabetes Sister     Social History   Socioeconomic History   Marital status: Legally Separated    Spouse name: Not on file   Number of children: 0   Years of education: College   Highest education level: Not on file  Occupational History   Occupation: [employer]    Employer: [employer]  Tobacco Use   Smoking status: Former    Packs/day:  1.00    Years: 20.00    Pack years: 20.00    Types: Cigarettes    Quit date: 11/01/2013    Years since quitting: 8.2   Smokeless tobacco: Never  Vaping Use   Vaping Use: Never used  Substance and Sexual Activity   Alcohol use: Not Currently    Alcohol/week: 1.0 - 2.0 standard drink    Types: 1 - 2 Standard drinks or equivalent per week   Drug use: No   Sexual activity: Not Currently  Other Topics Concern   Not on file  Social History Narrative   Patient lives in 1 story home with sister.   Caffeine Use: 2 cups daily; sodas occasionally   Some college education   Works as Oceanographer for [employer]   Social Determinants of Radio broadcast assistant Strain: Not on file  Food Insecurity: No Food Insecurity   Worried About Charity fundraiser in the Last Year: Never true   Arboriculturist in the Last Year: Never true  Transportation Needs: No Transportation Needs   Lack of Transportation (Medical): No    Lack of Transportation (Non-Medical): No  Physical Activity: Not on file  Stress: Stress Concern Present   Feeling of Stress : Very much  Social Connections: Not on file  Intimate Partner Violence: Not on file    Outpatient Medications Prior to Visit  Medication Sig Dispense Refill   Accu-Chek FastClix Lancets MISC Use to check blood sugar five times daily. 510 each 2   acetaminophen (TYLENOL) 500 MG tablet Take 1,000-1,500 mg by mouth every 6 (six) hours as needed for mild pain.     amitriptyline (ELAVIL) 100 MG tablet Take 1 tablet (100 mg total) by mouth at bedtime. 90 tablet 0   amLODipine (NORVASC) 5 MG tablet Take 1 tablet (5 mg total) by mouth daily. 90 tablet 3   aspirin 81 MG chewable tablet Chew 1 tablet (81 mg total) by mouth daily. 30 tablet 1   atorvastatin (LIPITOR) 80 MG tablet Take 0.5 tablets (40 mg total) by mouth daily. 30 tablet 1   buPROPion (WELLBUTRIN XL) 300 MG 24 hr tablet Take 1 tablet (300 mg total) by mouth daily. 90 tablet 0   Cholecalciferol (VITAMIN D3 PO) Take 1 tablet by mouth daily. Unsure of dose     Continuous Blood Gluc Receiver (DEXCOM G7 RECEIVER) DEVI 1 Act by Does not apply route daily. 2 each 5   Continuous Blood Gluc Sensor (DEXCOM G7 SENSOR) MISC 1 Act by Does not apply route daily. 2 each 5   Continuous Blood Gluc Sensor (DEXCOM G7 SENSOR) MISC 1 Act by Does not apply route daily. 2 each 5   dexlansoprazole (DEXILANT) 60 MG capsule Take 1 capsule by mouth daily. 90 capsule 0   EDARBYCLOR 40-12.5 MG TABS Take 1 tablet by mouth daily. 90 tablet 0   EUTHYROX 88 MCG tablet Take 1 tablet by mouth once daily 90 tablet 0   ezetimibe (ZETIA) 10 MG tablet Take 1 tablet (10 mg total) by mouth daily. 30 tablet 1   FARXIGA 10 MG TABS tablet Take 1 tablet by mouth daily before breakfast. 90 tablet 1   gabapentin (NEURONTIN) 100 MG capsule Take 1 capsule (100 mg total) by mouth 3 (three) times daily. (Patient taking differently: Take 100-200 mg by mouth See  admin instructions. 200 mg int the morning 100 mg at bedtime) 270 capsule 1   insulin glargine, 1 Unit Dial, (TOUJEO SOLOSTAR)  300 UNIT/ML Solostar Pen Inject 26 Units into the skin daily. 18 mL 3   loratadine (CLARITIN) 10 MG tablet Take 1 tablet (10 mg total) by mouth daily. 90 tablet 0   Semaglutide, 2 MG/DOSE, 8 MG/3ML SOPN Inject 2 mg as directed once a week. (Patient taking differently: Inject 2 mg as directed every 14 (fourteen) days.) 9 mL 1   ticagrelor (BRILINTA) 90 MG TABS tablet Take 1 tablet (90 mg total) by mouth 2 (two) times daily. 60 tablet 1   insulin lispro (HUMALOG KWIKPEN) 200 UNIT/ML KwikPen Inject 5 Units into the skin 3 (three) times daily with meals. (Patient not taking: Reported on 01/19/2022) 9 mL 1   Insulin Pen Needle 32G X 4 MM MISC Use to administer insulin four times a day 360 each 3   No facility-administered medications prior to visit.    Allergies  Allergen Reactions   Anesthetics, Ester Anaphylaxis    ROS     Objective:    Physical Exam Constitutional:      General: She is not in acute distress.    Appearance: She is not ill-appearing.  Eyes:     Conjunctiva/sclera: Conjunctivae normal.     Pupils: Pupils are equal, round, and reactive to light.  Cardiovascular:     Rate and Rhythm: Normal rate.  Pulmonary:     Effort: Pulmonary effort is normal.     Breath sounds: Normal breath sounds.  Musculoskeletal:     Cervical back: Normal range of motion and neck supple.     Thoracic back: Normal.     Lumbar back: No spasms or tenderness. Normal range of motion. Negative right straight leg raise test and negative left straight leg raise test.     Right lower leg: No edema.     Left lower leg: No edema.     Comments: Good sensation and ROM of lumbar spine. Bilateral LE are neurovascularly intact with good strength bilaterally.   Skin:    General: Skin is warm and dry.  Neurological:     General: No focal deficit present.     Mental Status: She is  alert and oriented to person, place, and time.     Motor: No weakness.     Gait: Gait normal.  Psychiatric:        Mood and Affect: Mood normal.        Thought Content: Thought content normal.    BP 128/64 (BP Location: Left Arm, Patient Position: Sitting, Cuff Size: Large)   Pulse 82   Temp 97.6 F (36.4 C) (Temporal)   Ht '5\' 2"'$  (1.575 m)   Wt 132 lb (59.9 kg)   SpO2 98%   BMI 24.14 kg/m  Wt Readings from Last 3 Encounters:  01/19/22 132 lb (59.9 kg)  12/30/21 130 lb 3.2 oz (59.1 kg)  12/25/21 128 lb 14.4 oz (58.5 kg)       Assessment & Plan:   Problem List Items Addressed This Visit   None Visit Diagnoses     Acute bilateral low back pain without sciatica    -  Primary   Relevant Medications   HYDROcodone-acetaminophen (NORCO) 5-325 MG tablet      No distress. Benign exam. Unable to reproduce pain in office. Discussed patient with her PCP and will prescribe Norco for intermittent back pain. She will use caution and is aware this medication is sedating. Follow up with PCP for chronic concerns.   I have discontinued Karen Chafe. Rojek's Insulin Pen Needle.  I am also having her start on HYDROcodone-acetaminophen. Additionally, I am having her maintain her Accu-Chek FastClix Lancets, loratadine, acetaminophen, Cholecalciferol (VITAMIN D3 PO), amLODipine, Semaglutide (2 MG/DOSE), Farxiga, gabapentin, aspirin, atorvastatin, ezetimibe, ticagrelor, dexlansoprazole, Dexcom G7 Receiver, Dexcom G7 Sensor, Dexcom G7 Sensor, HumaLOG KwikPen, Euthyrox, buPROPion, amitriptyline, Edarbyclor, and [employer].  Meds ordered this encounter  Medications   HYDROcodone-acetaminophen (NORCO) 5-325 MG tablet    Sig: Take 1 tablet by mouth every 6 (six) hours as needed for moderate pain.    Dispense:  10 tablet    Refill:  0    Order Specific Question:   Supervising Provider    Answer:   Pricilla Holm A [[id number]]

## 2022-01-19 NOTE — Telephone Encounter (Signed)
Called pt to inform her pain medication has been sent to her pharmacy and advised her to be careful taking this medication and to follow up with Dr. Ronnald Ramp regarding getting this refilled. Pt verbalized understanding and is wondering if Vickie can send in something for her constipation as well? She has been taking miralax but states it does not seem to help her.

## 2022-01-21 ENCOUNTER — Encounter: Payer: Medicare Other | Admitting: Occupational Therapy

## 2022-01-21 ENCOUNTER — Ambulatory Visit: Payer: Medicare Other | Admitting: Speech Pathology

## 2022-01-21 NOTE — Telephone Encounter (Signed)
Called pt and relayed Vickie's message. Pt verbalized understanding

## 2022-01-26 ENCOUNTER — Encounter: Payer: Medicare Other | Admitting: Occupational Therapy

## 2022-01-26 ENCOUNTER — Ambulatory Visit: Payer: Medicare Other | Admitting: Speech Pathology

## 2022-01-28 ENCOUNTER — Ambulatory Visit (INDEPENDENT_AMBULATORY_CARE_PROVIDER_SITE_OTHER): Payer: Medicare Other

## 2022-01-28 ENCOUNTER — Encounter: Payer: Medicare Other | Admitting: Speech Pathology

## 2022-01-28 VITALS — Ht 62.0 in | Wt 132.0 lb

## 2022-01-28 DIAGNOSIS — Z0000 Encounter for general adult medical examination without abnormal findings: Secondary | ICD-10-CM | POA: Diagnosis not present

## 2022-01-28 NOTE — Patient Instructions (Addendum)
Sonia Skinner , Thank you for taking time to come for your Medicare Wellness Visit. I appreciate your ongoing commitment to your health goals. Please review the following plan we discussed and let me know if I can assist you in the future.   These are the goals we discussed:  Goals       Increase physical activity (pt-stated)      Patient Self-Care Activities      Timeframe:  Long-Range Goal Priority:  High Start Date:       12/16/21                      Expected End Date:    12/17/22                   Patient Sonia Skinner will: Take medications as prescribed Attend all scheduled provider appointments Call pharmacy for medication refills Call provider office for new concerns or questions Continue to check fasting (first thing in the morning, before eating) blood sugars at home; check your blood sugars periodically throughout the day using your continuous glucose monitor Ask your endocrinology provider to place a referral to the [facility] if you would like to have additional training in the use of your continuous glucose monitor-- or-- if you would like to have instructions about the best diet to follow when you have Diabetes Continue to follow heart healthy, low salt, low cholesterol, carbohydrate-modified, low sugar diet Continue to regularly monitor your blood pressures at home-- write these values down on paper so we can review them during our next telephone appointment Consider making an appointment with Dr. Ronnald Ramp to discuss your back pain that has worsened and the increased fatigue you are experiencing Keep up the great work preventing falls!      Patient Stated        This is a list of the screening recommended for you and due dates:  Health Maintenance  Topic Date Due   COVID-19 Vaccine (5 - Pfizer series) 02/13/2022*   Zoster (Shingles) Vaccine (1 of 2) 04/30/2022*   Flu Shot  03/16/2022   Complete foot exam   04/07/2022   Hemoglobin A1C  05/16/2022   Eye exam for  diabetics  06/04/2022   Mammogram  06/19/2023   Colon Cancer Screening  11/24/2025   Tetanus Vaccine  11/25/2026   Pneumonia Vaccine  Completed   DEXA scan (bone density measurement)  Completed   Hepatitis C Screening: USPSTF Recommendation to screen - Ages 11-79 yo.  Completed   HPV Vaccine  Aged Out  *Topic was postponed. The date shown is not the original due date.   Opioid Pain Medicine Management Opioids are powerful medicines that are used to treat moderate to severe pain. When used for short periods of time, they can help you to: Sleep better. Do better in physical or occupational therapy. Feel better in the first few days after an injury. Recover from surgery. Opioids should be taken with the supervision of a trained health care provider. They should be taken for the shortest period of time possible. This is because opioids can be addictive, and the longer you take opioids, the greater your risk of addiction. This addiction can also be called opioid use disorder. What are the risks? Using opioid pain medicines for longer than 3 days increases your risk of side effects. Side effects include: Constipation. Nausea and vomiting. Breathing difficulties (respiratory depression). Drowsiness. Confusion. Opioid use disorder. Itching. Taking opioid pain medicine for  a long period of time can affect your ability to do daily tasks. It also puts you at risk for: Motor vehicle crashes. Depression. Suicide. Heart attack. Overdose, which can be life-threatening. What is a pain treatment plan? A pain treatment plan is an agreement between you and your health care provider. Pain is unique to each person, and treatments vary depending on your condition. To manage your pain, you and your health care provider need to work together. To help you do this: Discuss the goals of your treatment, including how much pain you might expect to have and how you will manage the pain. Review the risks and  benefits of taking opioid medicines. Remember that a good treatment plan uses more than one approach and minimizes the chance of side effects. Be honest about the amount of medicines you take and about any drug or alcohol use. Get pain medicine prescriptions from only one health care provider. Pain can be managed with many types of alternative treatments. Ask your health care provider to refer you to one or more specialists who can help you manage pain through: Physical or occupational therapy. Counseling (cognitive behavioral therapy). Good nutrition. Biofeedback. Massage. Meditation. Non-opioid medicine. Following a gentle exercise program. How to use opioid pain medicine Taking medicine Take your pain medicine exactly as told by your health care provider. Take it only when you need it. If your pain gets less severe, you may take less than your prescribed dose if your health care provider approves. If you are not having pain, do nottake pain medicine unless your health care provider tells you to take it. If your pain is severe, do nottry to treat it yourself by taking more pills than instructed on your prescription. Contact your health care provider for help. Write down the times when you take your pain medicine. It is easy to become confused while on pain medicine. Writing the time can help you avoid overdose. Take other over-the-counter or prescription medicines only as told by your health care provider. Keeping yourself and others safe  While you are taking opioid pain medicine: Do not drive, use machinery, or power tools. Do not sign legal documents. Do not drink alcohol. Do not take sleeping pills. Do not supervise children by yourself. Do not do activities that require climbing or being in high places. Do not go to a lake, river, ocean, spa, or swimming pool. Do not share your pain medicine with anyone. Keep pain medicine in a locked cabinet or in a secure area where pets and  children cannot reach it. Stopping your use of opioids If you have been taking opioid medicine for more than a few weeks, you may need to slowly decrease (taper) how much you take until you stop completely. Tapering your use of opioids can decrease your risk of symptoms of withdrawal, such as: Pain and cramping in the abdomen. Nausea. Sweating. Sleepiness. Restlessness. Uncontrollable shaking (tremors). Cravings for the medicine. Do not attempt to taper your use of opioids on your own. Talk with your health care provider about how to do this. Your health care provider may prescribe a step-down schedule based on how much medicine you are taking and how long you have been taking it. Getting rid of leftover pills Do not save any leftover pills. Get rid of leftover pills safely by: Taking the medicine to a prescription take-back program. This is usually offered by the county or law enforcement. Bringing them to a pharmacy that has a drug disposal container. Flushing  them down the toilet. Check the label or package insert of your medicine to see whether this is safe to do. Throwing them out in the trash. Check the label or package insert of your medicine to see whether this is safe to do. If it is safe to throw it out, remove the medicine from the original container, put it into a sealable bag or container, and mix it with used coffee grounds, food scraps, dirt, or cat litter before putting it in the trash. Follow these instructions at home: Activity Do exercises as told by your health care provider. Avoid activities that make your pain worse. Return to your normal activities as told by your health care provider. Ask your health care provider what activities are safe for you. General instructions You may need to take these actions to prevent or treat constipation: Drink enough fluid to keep your urine pale yellow. Take over-the-counter or prescription medicines. Eat foods that are high in  fiber, such as beans, whole grains, and fresh fruits and vegetables. Limit foods that are high in fat and processed sugars, such as fried or sweet foods. Keep all follow-up visits. This is important. Where to find support If you have been taking opioids for a long time, you may benefit from receiving support for quitting from a local support group or counselor. Ask your health care provider for a referral to these resources in your area. Where to find more information Centers for Disease Control and Prevention (CDC): http://www.wolf.info/ [place] Food and Drug Administration (FDA): [place] Get help right away if: You may have taken too much of an opioid (overdosed). Common symptoms of an overdose: Your breathing is slower or more shallow than normal. You have a very slow heartbeat (pulse). You have slurred speech. You have nausea and vomiting. Your pupils become very small. You have other potential symptoms: You are very confused. You faint or feel like you will faint. You have cold, clammy skin. You have blue lips or fingernails. You have thoughts of harming yourself or harming others. These symptoms may represent a serious problem that is an emergency. Do not wait to see if the symptoms will go away. Get medical help right away. Call your local emergency services (911 in the [place] Do not drive yourself to the hospital.  If you ever feel like you may hurt yourself or others, or have thoughts about taking your own life, get help right away. Go to your nearest emergency department or: Call your local emergency services (911 in the [place] Call the [facility] 347-692-2072 in the [place] Call a suicide crisis helpline, such as the [place] at 620-376-1399 or 988 in the [place]. This is open 24 hours a day in the [place] Text the Crisis Text Line at (903) 178-4086 (in the [place].). Summary Opioid medicines can help you manage moderate to severe pain for a short period  of time. A pain treatment plan is an agreement between you and your health care provider. Discuss the goals of your treatment, including how much pain you might expect to have and how you will manage the pain. If you think that you or someone else may have taken too much of an opioid, get medical help right away. This information is not intended to replace advice given to you by your health care provider. Make sure you discuss any questions you have with your health care provider. Document Revised: 02/25/2021 Document Reviewed: 11/12/2020 Elsevier Patient Education  [place].  Advanced directives: No  Conditions/risks identified: None  Next appointment: Follow up in one year for your annual wellness visit    Preventive Care 65 Years and Older, Female Preventive care refers to lifestyle choices and visits with your health care provider that can promote health and wellness. What does preventive care include? A yearly physical exam. This is also called an annual well check. Dental exams once or twice a year. Routine eye exams. Ask your health care provider how often you should have your eyes checked. Personal lifestyle choices, including: Daily care of your teeth and gums. Regular physical activity. Eating a healthy diet. Avoiding tobacco and drug use. Limiting alcohol use. Practicing safe sex. Taking low-dose aspirin every day. Taking vitamin and mineral supplements as recommended by your health care provider. What happens during an annual well check? The services and screenings done by your health care provider during your annual well check will depend on your age, overall health, lifestyle risk factors, and family history of disease. Counseling  Your health care provider may ask you questions about your: Alcohol use. Tobacco use. Drug use. Emotional well-being. Home and relationship well-being. Sexual activity. Eating habits. History of falls. Memory and ability  to understand (cognition). Work and work Statistician. Reproductive health. Screening  You may have the following tests or measurements: Height, weight, and BMI. Blood pressure. Lipid and cholesterol levels. These may be checked every 5 years, or more frequently if you are over 86 years old. Skin check. Lung cancer screening. You may have this screening every year starting at age 46 if you have a 30-pack-year history of smoking and currently smoke or have quit within the past 15 years. Fecal occult blood test (FOBT) of the stool. You may have this test every year starting at age 38. Flexible sigmoidoscopy or colonoscopy. You may have a sigmoidoscopy every 5 years or a colonoscopy every 10 years starting at age 46. Hepatitis C blood test. Hepatitis B blood test. Sexually transmitted disease (STD) testing. Diabetes screening. This is done by checking your blood sugar (glucose) after you have not eaten for a while (fasting). You may have this done every 1-3 years. Bone density scan. This is done to screen for osteoporosis. You may have this done starting at age 23. Mammogram. This may be done every 1-2 years. Talk to your health care provider about how often you should have regular mammograms. Talk with your health care provider about your test results, treatment options, and if necessary, the need for more tests. Vaccines  Your health care provider may recommend certain vaccines, such as: Influenza vaccine. This is recommended every year. Tetanus, diphtheria, and acellular pertussis (Tdap, Td) vaccine. You may need a Td booster every 10 years. Zoster vaccine. You may need this after age 35. Pneumococcal 13-valent conjugate (PCV13) vaccine. One dose is recommended after age 43. Pneumococcal polysaccharide (PPSV23) vaccine. One dose is recommended after age 52. Talk to your health care provider about which screenings and vaccines you need and how often you need them. This information is not  intended to replace advice given to you by your health care provider. Make sure you discuss any questions you have with your health care provider. Document Released: 08/29/2015 Document Revised: 04/21/2016 Document Reviewed: 06/03/2015 Elsevier Interactive Patient Education  2017 [place] Prevention in the Home Falls can cause injuries. They can happen to people of all ages. There are many things you can do to make your home safe and to help prevent falls.  What can I do on the outside of my home? Regularly fix the edges of walkways and driveways and fix any cracks. Remove anything that might make you trip as you walk through a door, such as a raised step or threshold. Trim any bushes or trees on the path to your home. Use bright outdoor lighting. Clear any walking paths of anything that might make someone trip, such as rocks or tools. Regularly check to see if handrails are loose or broken. Make sure that both sides of any steps have handrails. Any raised decks and porches should have guardrails on the edges. Have any leaves, snow, or ice cleared regularly. Use sand or salt on walking paths during winter. Clean up any spills in your garage right away. This includes oil or grease spills. What can I do in the bathroom? Use night lights. Install grab bars by the toilet and in the tub and shower. Do not use towel bars as grab bars. Use non-skid mats or decals in the tub or shower. If you need to sit down in the shower, use a plastic, non-slip stool. Keep the floor dry. Clean up any water that spills on the floor as soon as it happens. Remove soap buildup in the tub or shower regularly. Attach bath mats securely with double-sided non-slip rug tape. Do not have throw rugs and other things on the floor that can make you trip. What can I do in the bedroom? Use night lights. Make sure that you have a light by your bed that is easy to reach. Do not use any sheets or blankets that are  too big for your bed. They should not hang down onto the floor. Have a firm chair that has side arms. You can use this for support while you get dressed. Do not have throw rugs and other things on the floor that can make you trip. What can I do in the kitchen? Clean up any spills right away. Avoid walking on wet floors. Keep items that you use a lot in easy-to-reach places. If you need to reach something above you, use a strong step stool that has a grab bar. Keep electrical cords out of the way. Do not use floor polish or wax that makes floors slippery. If you must use wax, use non-skid floor wax. Do not have throw rugs and other things on the floor that can make you trip. What can I do with my stairs? Do not leave any items on the stairs. Make sure that there are handrails on both sides of the stairs and use them. Fix handrails that are broken or loose. Make sure that handrails are as long as the stairways. Check any carpeting to make sure that it is firmly attached to the stairs. Fix any carpet that is loose or worn. Avoid having throw rugs at the top or bottom of the stairs. If you do have throw rugs, attach them to the floor with carpet tape. Make sure that you have a light switch at the top of the stairs and the bottom of the stairs. If you do not have them, ask someone to add them for you. What else can I do to help prevent falls? Wear shoes that: Do not have high heels. Have rubber bottoms. Are comfortable and fit you well. Are closed at the toe. Do not wear sandals. If you use a stepladder: Make sure that it is fully opened. Do not climb a closed stepladder. Make sure that both sides of  the stepladder are locked into place. Ask someone to hold it for you, if possible. Clearly mark and make sure that you can see: Any grab bars or handrails. First and last steps. Where the edge of each step is. Use tools that help you move around (mobility aids) if they are needed. These  include: Canes. Walkers. Scooters. Crutches. Turn on the lights when you go into a dark area. Replace any light bulbs as soon as they burn out. Set up your furniture so you have a clear path. Avoid moving your furniture around. If any of your floors are uneven, fix them. If there are any pets around you, be aware of where they are. Review your medicines with your doctor. Some medicines can make you feel dizzy. This can increase your chance of falling. Ask your doctor what other things that you can do to help prevent falls. This information is not intended to replace advice given to you by your health care provider. Make sure you discuss any questions you have with your health care provider. Document Released: 05/29/2009 Document Revised: 01/08/2016 Document Reviewed: 09/06/2014 Elsevier Interactive Patient Education  2017 [employer].

## 2022-01-28 NOTE — Progress Notes (Signed)
Subjective:   Sonia Skinner is a 71 y.o. female who presents for Medicare Annual (Subsequent) preventive examination.  Review of Systems    Virtual Visit via Telephone Note  I connected with  [name] on 01/28/22 at  1:30 PM EDT by telephone and verified that I am speaking with the correct person using two identifiers.  Location: Patient: Home Provider: Office Persons participating in the virtual visit: patient/Nurse Health Advisor   I discussed the limitations, risks, security and privacy concerns of performing an evaluation and management service by telephone and the availability of in person appointments. The patient expressed understanding and agreed to proceed.  Interactive audio and video telecommunications were attempted between this nurse and patient, however failed, due to patient having technical difficulties OR patient did not have access to video capability.  We continued and completed visit with audio only.  Some vital signs may be absent or patient reported.   Criselda Peaches, LPN  Cardiac Risk Factors include: advanced age (>19mn, >>40women);diabetes mellitus;hypertension     Objective:    Today's Vitals   01/28/22 1131  Weight: 132 lb (59.9 kg)  Height: '5\' 2"'$  (1.575 m)  PainSc: 0-No pain   Body mass index is 24.14 kg/m.     01/28/2022   11:40 AM 12/15/2021    3:37 PM 12/01/2021   10:29 AM 12/01/2021    9:45 AM 11/16/2021   11:01 PM 11/13/2021    1:36 PM 08/21/2021    1:46 PM  Advanced Directives  Does Patient Have a Medical Advance Directive? No No No No  No No  Would patient like information on creating a medical advance directive? No - Patient declined  No - Patient declined No - Patient declined No - Patient declined      Current Medications (verified) Outpatient Encounter Medications as of 01/28/2022  Medication Sig   Accu-Chek FastClix Lancets MISC Use to check blood sugar five times daily.   acetaminophen (TYLENOL) 500 MG tablet Take 1,000-1,500  mg by mouth every 6 (six) hours as needed for mild pain.   amitriptyline (ELAVIL) 100 MG tablet Take 1 tablet (100 mg total) by mouth at bedtime.   amLODipine (NORVASC) 5 MG tablet Take 1 tablet (5 mg total) by mouth daily.   aspirin 81 MG chewable tablet Chew 1 tablet (81 mg total) by mouth daily.   atorvastatin (LIPITOR) 80 MG tablet Take 0.5 tablets (40 mg total) by mouth daily.   buPROPion (WELLBUTRIN XL) 300 MG 24 hr tablet Take 1 tablet (300 mg total) by mouth daily.   Cholecalciferol (VITAMIN D3 PO) Take 1 tablet by mouth daily. Unsure of dose   Continuous Blood Gluc Receiver (DEXCOM G7 RECEIVER) DEVI 1 Act by Does not apply route daily.   Continuous Blood Gluc Sensor (DEXCOM G7 SENSOR) MISC 1 Act by Does not apply route daily.   Continuous Blood Gluc Sensor (DEXCOM G7 SENSOR) MISC 1 Act by Does not apply route daily.   dexlansoprazole (DEXILANT) 60 MG capsule Take 1 capsule by mouth daily.   EDARBYCLOR 40-12.5 MG TABS Take 1 tablet by mouth daily.   EUTHYROX 88 MCG tablet Take 1 tablet by mouth once daily   ezetimibe (ZETIA) 10 MG tablet Take 1 tablet (10 mg total) by mouth daily.   FARXIGA 10 MG TABS tablet Take 1 tablet by mouth daily before breakfast.   gabapentin (NEURONTIN) 100 MG capsule Take 1 capsule (100 mg total) by mouth 3 (three) times daily. (Patient  taking differently: Take 100-200 mg by mouth See admin instructions. 200 mg int the morning 100 mg at bedtime)   HYDROcodone-acetaminophen (NORCO) 5-325 MG tablet Take 1 tablet by mouth every 6 (six) hours as needed for moderate pain.   insulin glargine, 1 Unit Dial, (TOUJEO SOLOSTAR) 300 UNIT/ML Solostar Pen Inject 26 Units into the skin daily.   insulin lispro (HUMALOG KWIKPEN) 200 UNIT/ML KwikPen Inject 5 Units into the skin 3 (three) times daily with meals. (Patient not taking: Reported on 01/19/2022)   loratadine (CLARITIN) 10 MG tablet Take 1 tablet (10 mg total) by mouth daily.   Semaglutide, 2 MG/DOSE, 8 MG/3ML SOPN Inject  2 mg as directed once a week. (Patient taking differently: Inject 2 mg as directed every 14 (fourteen) days.)   ticagrelor (BRILINTA) 90 MG TABS tablet Take 1 tablet (90 mg total) by mouth 2 (two) times daily.   No facility-administered encounter medications on file as of 01/28/2022.    Allergies (verified) Anesthetics, ester   History: Past Medical History:  Diagnosis Date   Ankle fracture    Left   Anxiety    Carotid artery occlusion    Closed fracture of left distal fibula 1/0/1751   Complication of anesthesia    ALLERY TO ESTER BASE   Depression    early 81s   Diabetes mellitus    INSULIN DEPENDENT   GERD (gastroesophageal reflux disease)    Headache    years ago   Hypertension    Pneumonia    Stroke [facility]) March 2015    left MCA infarct, slight weakness on left side   Thyroid disease    Past Surgical History:  Procedure Laterality Date   ABDOMINAL HYSTERECTOMY     ANTERIOR FIXATION AND POSTERIOR MICRODISCECTOMY CERVICAL SPINE  1999   CAROTID ENDARTERECTOMY Left 11-04-13   cea   ENDARTERECTOMY Left 11/04/2013   IR ANGIO INTRA EXTRACRAN SEL COM CAROTID INNOMINATE BILAT MOD SED  11/16/2021   IR ANGIO INTRA EXTRACRAN SEL COM CAROTID INNOMINATE UNI L MOD SED  11/19/2021   IR ANGIO VERTEBRAL SEL VERTEBRAL BILAT MOD SED  11/16/2021   IR CT HEAD LTD  11/19/2021   IR INTRAVSC STENT CERV CAROTID W/EMB-PROT MOD SED INCL ANGIO  11/19/2021   IR RADIOLOGIST EVAL & MGMT  12/13/2021   ORIF ANKLE FRACTURE Left 09/16/2017   Procedure: OPEN REDUCTION INTERNAL FIXATION (ORIF) LEFT ANKLE FRACTURE;  Surgeon: Marchia Bond, MD;  Location: [facility];  Service: Orthopedics;  Laterality: Left;   RADIOLOGY WITH ANESTHESIA N/A 11/19/2021   Procedure: Left carotid angioplasty with possible stenting;  Surgeon: Luanne Bras, MD;  Location: [place];  Service: Radiology;  Laterality: N/A;   Family History  Problem Relation Age of Onset   Diabetes Mother    Heart disease Mother        Before age 48   Cancer  Father        Lung   Hypertension Sister    Diabetes Sister    Diabetes Sister    Diabetes Sister    Social History   Socioeconomic History   Marital status: Legally Separated    Spouse name: Not on file   Number of children: 0   Years of education: College   Highest education level: Not on file  Occupational History   Occupation: [employer]    Employer: [employer] [place]  Tobacco Use   Smoking status: Former    Packs/day: 1.00    Years: 20.00    Total pack years:  20.00    Types: Cigarettes    Quit date: 11/01/2013    Years since quitting: 8.2   Smokeless tobacco: Never  Vaping Use   Vaping Use: Never used  Substance and Sexual Activity   Alcohol use: Not Currently    Alcohol/week: 1.0 - 2.0 standard drink of alcohol    Types: 1 - 2 Standard drinks or equivalent per week   Drug use: No   Sexual activity: Not Currently  Other Topics Concern   Not on file  Social History Narrative   Patient lives in 1 story home with sister.   Caffeine Use: 2 cups daily; sodas occasionally   Some college education   Works as Oceanographer for [employer]   Social Determinants of Health   Financial Resource Strain: Low Risk  (01/28/2022)   Overall Financial Resource Strain (CARDIA)    Difficulty of Paying Living Expenses: Not hard at all  Food Insecurity: No Food Insecurity (01/28/2022)   Hunger Vital Sign    Worried About Running Out of Food in the Last Year: Never true    Ran Out of Food in the Last Year: Never true  Transportation Needs: No Transportation Needs (01/28/2022)   PRAPARE - Hydrologist (Medical): No    Lack of Transportation (Non-Medical): No  Physical Activity: Insufficiently Active (01/28/2022)   Exercise Vital Sign    Days of Exercise per Week: 1 day    Minutes of Exercise per Session: 30 min  Stress: No Stress Concern Present (01/28/2022)   [place]    Feeling of Stress : Not at  all  Social Connections: Moderately Integrated (01/28/2022)   Social Connection and Isolation Panel [NHANES]    Frequency of Communication with Friends and Family: More than three times a week    Frequency of Social Gatherings with Friends and Family: More than three times a week    Attends Religious Services: More than 4 times per year    Active Member of [employer] or Organizations: Yes    Attends Music therapist: More than 4 times per year    Marital Status: Separated     Clinical Intake:  Pre-visit preparation completed: NoNutrition Risk Assessment:  Has the patient had any N/V/D within the last 2 months?  No  Does the patient have any non-healing wounds?  No  Has the patient had any unintentional weight loss or weight gain?  No   Diabetes:  Is the patient diabetic?  Yes  If diabetic, was a CBG obtained today?  Yes CBG 111 Taken by patient Did the patient bring in their glucometer from home?  No  How often do you monitor your CBG's? Daily.   Financial Strains and Diabetes Management:  Are you having any financial strains with the device, your supplies or your medication? No .  Does the patient want to be seen by Chronic Care Management for management of their diabetes?  No  Would the patient like to be referred to a Nutritionist or for Diabetic Management?  No   Diabetic Exams:  Diabetic Eye Exam: Completed Yes. Overdue for diabetic eye exam. Pt has been advised about the importance in completing this exam. A referral has been placed today. Message sent to referral coordinator for scheduling purposes. Advised pt to expect a call from office referred to regarding appt.  Diabetic Foot Exam: Completed Yes . Pt has been advised about the importance in completing this  exam. Pt is scheduled for diabetic foot exam on Followed by PCP.    Diabetic?  Yes  Activities of Daily Living    01/28/2022   11:39 AM 11/16/2021   11:00 PM  In your present state of health, do you  have any difficulty performing the following activities:  Hearing? 0 0  Vision? 0 0  Difficulty concentrating or making decisions? 0 0  Walking or climbing stairs? 0 0  Dressing or bathing? 0 0  Doing errands, shopping?  0  Preparing Food and eating ? N   Using the Toilet? N   In the past six months, have you accidently leaked urine? N   Do you have problems with loss of bowel control? N   Managing your Medications? N   Managing your Finances? N     Patient Care Team: [name], MD as PCP - General (Internal Medicine) Adele Schilder Arlyce Harman, MD as Consulting Physician (Psychiatry) Alanda Slim Neena Rhymes, MD as Consulting Physician (Ophthalmology) Knox Royalty, RN as Case Manager Laurance Flatten Bosie Helper, LCSW as Social Worker (Licensed Clinical Social Worker)  Indicate any recent [place] you may have received from other than [facility] providers in the past year (date may be approximate).     Assessment:   This is a routine wellness examination for [facility].  Hearing/Vision screen Hearing Screening - Comments:: Wears hearing aids Vision Screening - Comments:: Wears glasses. Followed by [facility]  Dietary issues and exercise activities discussed: Exercise limited by: None identified   Goals Addressed               This Visit's Progress     Increase physical activity (pt-stated)         Depression Screen    01/28/2022   11:36 AM 01/19/2022   11:41 AM 12/16/2021    1:15 PM 10/13/2021   10:44 AM 08/04/2021    9:54 AM 08/03/2021    3:18 PM 03/09/2021    3:33 PM  PHQ 2/9 Scores  PHQ - 2 Score 0 '4 1 2 2 '$ 0 3  PHQ- 9 Score 0 '16  4 6  7    '$ Fall Risk    01/28/2022   11:39 AM 01/15/2022    2:15 PM 12/16/2021    1:15 PM 10/13/2021   10:44 AM 10/05/2021   11:29 AM  Fall Risk   Falls in the past year? 0 0 0 0 0  Comment  continues to deny falls x 12 months; does not require use of assistive devices     Number falls in past yr: 0 0 0 0 0  Injury with Fall? 0 0 0 0 0   Comment  N/A- no falls reported N/A- no falls reported x 12 months; does not use assistive devices    Risk for fall due to : No Fall Risks Medication side effect Medication side effect No Fall Risks Medication side effect;Impaired vision  Follow up  Falls prevention discussed Falls prevention discussed Falls evaluation completed Falls prevention discussed;Education provided;Falls evaluation completed    FALL RISK PREVENTION PERTAINING TO THE HOME:  Any stairs in or around the home? No  If so, are there any without handrails? No  Home free of loose throw rugs in walkways, pet beds, electrical cords, etc? Yes  Adequate lighting in your home to reduce risk of falls? Yes   ASSISTIVE DEVICES UTILIZED TO PREVENT FALLS:  Life alert? No  Use of a cane, walker or w/c? No  Grab  bars in the bathroom? No  Shower chair or bench in shower? No  Elevated toilet seat or a handicapped toilet? No   TIMED UP AND GO:  Was the test performed? No . Audio Visit  Cognitive Function:    03/03/2018    1:00 PM  MMSE - Mini Mental State Exam  Orientation to time 5  Orientation to Place 5  Registration 3  Attention/ Calculation 5  Recall 1  Language- name 2 objects 2  Language- repeat 1  Language- follow 3 step command 3  Language- read & follow direction 1  Write a sentence 1  Copy design 1  Total score 28        01/28/2022   11:40 AM  6CIT Screen  What Year? 0 points  What month? 0 points  What time? 0 points  Count back from 20 0 points  Months in reverse 0 points  Repeat phrase 0 points  Total Score 0 points    Immunizations Immunization History  Administered Date(s) Administered   Fluad Quad(high Dose 65+) 04/06/2019   Influenza Split 05/17/2013   Influenza, High Dose Seasonal PF 05/25/2018   Influenza,inj,Quad PF,6+ Mos 05/08/2014, 05/13/2015, 06/29/2017, 04/24/2020   Influenza-Unspecified 07/27/2021   PFIZER(Purple Top)SARS-COV-2 Vaccination 09/22/2019, 10/13/2019,  05/18/2020, 02/05/2021   Pneumococcal Conjugate-13 12/20/2016   Pneumococcal Polysaccharide-23 11/02/2013, 04/06/2019   Tdap 11/24/2016    TDAP status: Up to date  Flu Vaccine status: Up to date  Pneumococcal vaccine status: Up to date  Covid-19 vaccine status: Completed vaccines  Qualifies for Shingles Vaccine? Yes   Zostavax completed No   Shingrix Completed?: No.    Education has been provided regarding the importance of this vaccine. Patient has been advised to call insurance company to determine out of pocket expense if they have not yet received this vaccine. Advised may also receive vaccine at local pharmacy or Health Dept. Verbalized acceptance and understanding.  Screening Tests Health Maintenance  Topic Date Due   COVID-19 Vaccine (5 - Pfizer series) 02/13/2022 (Originally 04/02/2021)   Zoster Vaccines- Shingrix (1 of 2) 04/30/2022 (Originally 07/24/1999)   INFLUENZA VACCINE  03/16/2022   FOOT EXAM  04/07/2022   HEMOGLOBIN A1C  05/16/2022   OPHTHALMOLOGY EXAM  06/04/2022   MAMMOGRAM  06/19/2023   COLONOSCOPY (Pts 45-58yr Insurance coverage will need to be confirmed)  11/24/2025   TETANUS/TDAP  11/25/2026   Pneumonia Vaccine 71 Years old  Completed   DEXA SCAN  Completed   Hepatitis C Screening  Completed   HPV VACCINES  Aged Out    Health Maintenance  There are no preventive care reminders to display for this patient.   Colorectal cancer screening: Type of screening: Colonoscopy. Completed 11/25/15. Repeat every 10 years  Mammogram status: Completed 06/18/21. Repeat every year  Bone Density status: Completed 05/15/09. Results reflect: Bone density results: NORMAL. Repeat every   years.  Lung Cancer Screening: (Low Dose CT Chest recommended if Age 73-80years, 30 pack-year currently smoking OR have quit w/in 15years.) does not qualify.   Additional Screening:  Hepatitis C Screening: does qualify; Completed 12/20/16  Vision Screening: Recommended annual  ophthalmology exams for early detection of glaucoma and other disorders of the eye. Is the patient up to date with their annual eye exam?  Yes  Who is the provider or what is the name of the office in which the patient attends annual [facility] exams[facility] is not [facility] with a provider, would they like to be referred  to a provider to establish care? No .   Dental Screening: Recommended annual dental exams for proper oral hygiene  Community Resource Referral / Chronic Care Management:  CRR required this visit?  No   CCM required this visit?  No      Plan:     I have personally reviewed and noted the following in the patient's chart:   Medical and social history Use of alcohol, tobacco or illicit drugs  Current medications and supplements including opioid prescriptions.  Functional ability and status Nutritional status Physical activity Advanced directives List of other physicians Hospitalizations, surgeries, and ER visits in previous 12 months Vitals Screenings to include cognitive, depression, and falls Referrals and appointments  In addition, I have reviewed and discussed with patient certain preventive protocols, quality metrics, and best practice recommendations. A written personalized care plan for preventive services as well as general preventive health recommendations were provided to patient.     Criselda Peaches, LPN   2/33/0076   Nurse Notes: None

## 2022-02-08 ENCOUNTER — Telehealth: Payer: Self-pay | Admitting: Internal Medicine

## 2022-02-08 ENCOUNTER — Other Ambulatory Visit: Payer: Self-pay | Admitting: Internal Medicine

## 2022-02-08 DIAGNOSIS — G43011 Migraine without aura, intractable, with status migrainosus: Secondary | ICD-10-CM

## 2022-02-08 DIAGNOSIS — E039 Hypothyroidism, unspecified: Secondary | ICD-10-CM

## 2022-02-08 DIAGNOSIS — E119 Type 2 diabetes mellitus without complications: Secondary | ICD-10-CM

## 2022-02-08 DIAGNOSIS — Z794 Long term (current) use of insulin: Secondary | ICD-10-CM

## 2022-02-08 MED ORDER — DEXCOM G7 RECEIVER DEVI: 1.0000 | Freq: Every day | 5 refills | Status: AC

## 2022-02-08 MED ORDER — GABAPENTIN 100 MG PO CAPS: 100.0000 mg | ORAL_CAPSULE | Freq: Three times a day (TID) | ORAL | 1 refills | Status: DC

## 2022-02-08 MED ORDER — DEXLANSOPRAZOLE 60 MG PO CPDR: 1.0000 | DELAYED_RELEASE_CAPSULE | Freq: Every day | ORAL | 0 refills | Status: DC

## 2022-02-08 MED ORDER — SEMAGLUTIDE (2 MG/DOSE) 8 MG/3ML ~~LOC~~ SOPN: 2.0000 mg | PEN_INJECTOR | SUBCUTANEOUS | 1 refills | Status: DC

## 2022-02-08 MED ORDER — DEXCOM G7 SENSOR MISC: 1.0000 | Freq: Every day | 5 refills | Status: DC

## 2022-02-08 MED ORDER — LEVOTHYROXINE SODIUM 88 MCG PO TABS: 88.0000 ug | ORAL_TABLET | Freq: Every day | ORAL | 0 refills | Status: DC

## 2022-02-08 NOTE — Telephone Encounter (Signed)
Caller & Relationship to patient: Sonia Skinner from [facility]  Call back number: [phone]  Date of last office visit: 11/26/21  Date of next office visit: 02/25/22  Medication(s) to be refilled: Continuous Blood Gluc Receiver (DEXCOM G7 RECEIVER) DEVI  Continuous Blood Gluc Sensor (DEXCOM G7 SENSOR) MISC  EUTHYROX 88 MCG tablet  ticagrelor (BRILINTA) 90 MG TABS tablet  Semaglutide, 2 MG/DOSE, 8 MG/3ML SOPN  dexlansoprazole (DEXILANT) 60 MG capsule  gabapentin (NEURONTIN) 100 MG capsule   Preferred Pharmacy:   [facility] - [place], [place] - [address] Phone:  [phone]  Fax:  [phone]

## 2022-02-09 ENCOUNTER — Other Ambulatory Visit: Payer: Self-pay

## 2022-02-09 DIAGNOSIS — Z794 Long term (current) use of insulin: Secondary | ICD-10-CM

## 2022-02-09 MED ORDER — TOUJEO SOLOSTAR 300 UNIT/ML ~~LOC~~ SOPN: 26.0000 [IU] | PEN_INJECTOR | Freq: Every day | SUBCUTANEOUS | 3 refills | Status: DC

## 2022-02-11 ENCOUNTER — Ambulatory Visit (INDEPENDENT_AMBULATORY_CARE_PROVIDER_SITE_OTHER): Payer: Medicare Other | Admitting: Psychologist

## 2022-02-11 DIAGNOSIS — F331 Major depressive disorder, recurrent, moderate: Secondary | ICD-10-CM

## 2022-02-11 NOTE — Progress Notes (Signed)
[place] Counselor Initial Adult Exam  Name: Sonia Skinner Date: 02/11/2022 MRN: [id number] DOB: [birth year] PCP: [name], MD  Time spent: 11:02 am to 11:37 am; total time: 35 minutes  This session was held via phone teletherapy due to the coronavirus risk at this time. The patient consented to phone teletherapy and was located at her home during this session. She is aware it is the responsibility of the patient to secure confidentiality on her end of the session. The provider was in a private home office for the duration of this session. Limits of confidentiality were discussed with the patient.   Guardian/Payee:  NA    Paperwork requested: No   Reason for Visit /Presenting Problem: Depression  Mental Status Exam: Appearance:   NA     Behavior:  Appropriate  Motor:  Normal  Speech/Language:   Clear and Coherent  Affect:  Appropriate  Mood:  normal  Thought process:  normal  Thought content:    WNL  Sensory/Perceptual disturbances:    WNL  Orientation:  oriented to person, place, and time/date  Attention:  Good  Concentration:  Good  Memory:  WNL  Fund of knowledge:   Good  Insight:    Poor  Judgment:   Fair  Impulse Control:  Good     Reported Symptoms:  The patient endorsed experiencing the following: fatigue, social isolation, avoiding pleasurable activities, rumination of negative thoughts, thoughts of hopelessness, lack of motivation, sad, thoughts of worthlessness. She denied suicidal and homicidal ideation.   Risk Assessment: Danger to Self:  No Self-injurious Behavior: No Danger to Others: No Duty to Warn:no Physical Aggression / Violence:No  Access to Firearms a concern: No  Gang Involvement:No  Patient / guardian was educated about steps to take if suicide or homicide risk level increases between visits: n/a While future psychiatric events cannot be accurately predicted, the patient does not currently require acute inpatient psychiatric  care and does not currently meet [facility] involuntary commitment criteria.  Substance Abuse History: Current substance abuse: No     Past Psychiatric History:   Previous psychological history is significant for depression Outpatient Providers:NA History of Psych Hospitalization: No  Psychological Testing:  NA    Abuse History:  Victim of: No.,  NA    Report needed: No. Victim of Neglect:No. Perpetrator of  NA   Witness / Exposure to Domestic Violence: No   Protective Services Involvement: No  Witness to [employer] Violence:  No   Family History:  Family History  Problem Relation Age of Onset   Diabetes Mother    Heart disease Mother        Before age 40   Cancer Father        Lung   Hypertension Sister    Diabetes Sister    Diabetes Sister    Diabetes Sister     Living situation: the patient lives with their family  Sexual Orientation: Straight  Relationship Status: divorced  Name of spouse / other:NA If a parent, number of children / ages:NA  Support Systems: Sister  Museum/gallery curator Stress:  No   Income/Employment/Disability: Social Cytogeneticist: No   Educational History: Education: college graduate  Religion/Sprituality/World View: Christian  Any cultural differences that may affect / interfere with treatment:  not applicable   Recreation/Hobbies: Denied  Stressors: Other: Caring for ex-husband     Strengths: Supportive Relationships  Barriers:  NA   Legal History: Pending legal issue / charges:  No  current charges. History of legal issue / charges:  Patient was arrested for drugs  Medical History/Surgical History: reviewed Past Medical History:  Diagnosis Date   Ankle fracture    Left   Anxiety    Carotid artery occlusion    Closed fracture of left distal fibula 02/16/8269   Complication of anesthesia    ALLERY TO ESTER BASE   Depression    early 73s   Diabetes mellitus    INSULIN DEPENDENT   GERD  (gastroesophageal reflux disease)    Headache    years ago   Hypertension    Pneumonia    Stroke [facility]) March 2015    left MCA infarct, slight weakness on left side   Thyroid disease     Past Surgical History:  Procedure Laterality Date   ABDOMINAL HYSTERECTOMY     ANTERIOR FIXATION AND POSTERIOR MICRODISCECTOMY CERVICAL SPINE  1999   CAROTID ENDARTERECTOMY Left 11-04-13   cea   ENDARTERECTOMY Left 11/04/2013   IR ANGIO INTRA EXTRACRAN SEL COM CAROTID INNOMINATE BILAT MOD SED  11/16/2021   IR ANGIO INTRA EXTRACRAN SEL COM CAROTID INNOMINATE UNI L MOD SED  11/19/2021   IR ANGIO VERTEBRAL SEL VERTEBRAL BILAT MOD SED  11/16/2021   IR CT HEAD LTD  11/19/2021   IR INTRAVSC STENT CERV CAROTID W/EMB-PROT MOD SED INCL ANGIO  11/19/2021   IR RADIOLOGIST EVAL & MGMT  12/13/2021   ORIF ANKLE FRACTURE Left 09/16/2017   Procedure: OPEN REDUCTION INTERNAL FIXATION (ORIF) LEFT ANKLE FRACTURE;  Surgeon: Marchia Bond, MD;  Location: [place];  Service: Orthopedics;  Laterality: Left;   RADIOLOGY WITH ANESTHESIA N/A 11/19/2021   Procedure: Left carotid angioplasty with possible stenting;  Surgeon: Luanne Bras, MD;  Location: [facility];  Service: Radiology;  Laterality: N/A;    Medications: Current Outpatient Medications  Medication Sig Dispense Refill   Accu-Chek FastClix Lancets MISC Use to check blood sugar five times daily. 510 each 2   acetaminophen (TYLENOL) 500 MG tablet Take 1,000-1,500 mg by mouth every 6 (six) hours as needed for mild pain.     amitriptyline (ELAVIL) 100 MG tablet Take 1 tablet (100 mg total) by mouth at bedtime. 90 tablet 0   amLODipine (NORVASC) 5 MG tablet Take 1 tablet (5 mg total) by mouth daily. 90 tablet 3   aspirin 81 MG chewable tablet Chew 1 tablet (81 mg total) by mouth daily. 30 tablet 1   atorvastatin (LIPITOR) 80 MG tablet Take 0.5 tablets (40 mg total) by mouth daily. 30 tablet 1   buPROPion (WELLBUTRIN XL) 300 MG 24 hr tablet Take 1 tablet (300 mg total) by mouth daily.  90 tablet 0   Cholecalciferol (VITAMIN D3 PO) Take 1 tablet by mouth daily. Unsure of dose     Continuous Blood Gluc Receiver (DEXCOM G7 RECEIVER) DEVI 1 Act by Does not apply route daily. 2 each 5   Continuous Blood Gluc Sensor (DEXCOM G7 SENSOR) MISC 1 Act by Does not apply route daily. 2 each 5   Continuous Blood Gluc Sensor (DEXCOM G7 SENSOR) MISC 1 Act by Does not apply route daily. 2 each 5   dexlansoprazole (DEXILANT) 60 MG capsule Take 1 capsule (60 mg total) by mouth daily. 90 capsule 0   EDARBYCLOR 40-12.5 MG TABS Take 1 tablet by mouth daily. 90 tablet 0   ezetimibe (ZETIA) 10 MG tablet Take 1 tablet (10 mg total) by mouth daily. 30 tablet 1   FARXIGA 10 MG TABS tablet Take 1  tablet by mouth daily before breakfast. 90 tablet 1   gabapentin (NEURONTIN) 100 MG capsule Take 1 capsule (100 mg total) by mouth 3 (three) times daily. 270 capsule 1   HYDROcodone-acetaminophen (NORCO) 5-325 MG tablet Take 1 tablet by mouth every 6 (six) hours as needed for moderate pain. 10 tablet 0   insulin glargine, 1 Unit Dial, (TOUJEO SOLOSTAR) 300 UNIT/ML Solostar Pen Inject 26 Units into the skin daily. 18 mL 3   insulin lispro (HUMALOG KWIKPEN) 200 UNIT/ML KwikPen Inject 5 Units into the skin 3 (three) times daily with meals. (Patient not taking: Reported on 01/19/2022) 9 mL 1   levothyroxine (EUTHYROX) 88 MCG tablet Take 1 tablet (88 mcg total) by mouth daily. 90 tablet 0   loratadine (CLARITIN) 10 MG tablet Take 1 tablet (10 mg total) by mouth daily. 90 tablet 0   Semaglutide, 2 MG/DOSE, 8 MG/3ML SOPN Inject 2 mg as directed once a week. 9 mL 1   ticagrelor (BRILINTA) 90 MG TABS tablet Take 1 tablet (90 mg total) by mouth 2 (two) times daily. 60 tablet 1   No current facility-administered medications for this visit.    Allergies  Allergen Reactions   Anesthetics, Ester Anaphylaxis    Diagnoses:  F33.1  major depressive affective disorder, recurrent, moderate  Plan of Care: The patient is a 71  year old Black woman who was referred due to experiencing depression. The patient lives with her daughter. The patient meets criteria for a diagnosis of F33.1 major depressive affective disorder, recurrent, moderate based off of the following: fatigue, social isolation, avoiding pleasurable activities, rumination of negative thoughts, thoughts of hopelessness, lack of motivation, sad, thoughts of worthlessness. She denied suicidal and homicidal ideation.   The stated that she wants to process emotions, learn to be more active, and work through different challenges.   This psychologist makes the recommendation that the patient participate in therapy at least once a month. If needed, possibly bi-weekly.    Conception Chancy, PsyD

## 2022-02-11 NOTE — Progress Notes (Signed)
                Renad Jenniges, PsyD 

## 2022-02-11 NOTE — Plan of Care (Signed)

## 2022-02-15 ENCOUNTER — Ambulatory Visit: Payer: Self-pay | Admitting: *Deleted

## 2022-02-15 ENCOUNTER — Ambulatory Visit: Payer: Medicare Other | Admitting: Licensed Clinical Social Worker

## 2022-02-15 ENCOUNTER — Encounter: Payer: Self-pay | Admitting: *Deleted

## 2022-02-15 DIAGNOSIS — E119 Type 2 diabetes mellitus without complications: Secondary | ICD-10-CM

## 2022-02-15 DIAGNOSIS — F332 Major depressive disorder, recurrent severe without psychotic features: Secondary | ICD-10-CM

## 2022-02-15 NOTE — Chronic Care Management (AMB) (Signed)
Care Management    RN Visit Note  02/15/2022 Name: Sonia Skinner MRN: [id number] DOB: 12-29-1950  Subjective: Sonia Skinner is a 71 y.o. year old female who is a primary care patient of [name], MD. The care management team was consulted for assistance with disease management and care coordination needs.    Collaboration with clinic CSW, [name]  for  Clinic RN CM case closure  in response to provider referral for case management and/or care coordination services.   Consent to Services:   Ms. Lindy was given information about Care Management services 12/16/21 including:  Care Management services includes personalized support from designated clinical staff supervised by her physician, including individualized plan of care and coordination with other care providers 24/7 contact phone numbers for assistance for urgent and routine care needs. The patient may stop case management services at any time by phone call to the office staff.  Patient agreed to services and consent obtained.   Assessment: Review of patient past medical history, allergies, medications, health status, including review of consultants reports, laboratory and other test data, was performed as part of comprehensive evaluation and provision of chronic care management services.  Care Plan  Allergies  Allergen Reactions   Anesthetics, Ester Anaphylaxis   Outpatient Encounter Medications as of 02/15/2022  Medication Sig   Accu-Chek FastClix Lancets MISC Use to check blood sugar five times daily.   acetaminophen (TYLENOL) 500 MG tablet Take 1,000-1,500 mg by mouth every 6 (six) hours as needed for mild pain.   amitriptyline (ELAVIL) 100 MG tablet Take 1 tablet (100 mg total) by mouth at bedtime.   amLODipine (NORVASC) 5 MG tablet Take 1 tablet (5 mg total) by mouth daily.   aspirin 81 MG chewable tablet Chew 1 tablet (81 mg total) by mouth daily.   atorvastatin (LIPITOR) 80 MG tablet Take 0.5 tablets (40 mg total) by  mouth daily.   buPROPion (WELLBUTRIN XL) 300 MG 24 hr tablet Take 1 tablet (300 mg total) by mouth daily.   Cholecalciferol (VITAMIN D3 PO) Take 1 tablet by mouth daily. Unsure of dose   Continuous Blood Gluc Receiver (DEXCOM G7 RECEIVER) DEVI 1 Act by Does not apply route daily.   Continuous Blood Gluc Sensor (DEXCOM G7 SENSOR) MISC 1 Act by Does not apply route daily.   Continuous Blood Gluc Sensor (DEXCOM G7 SENSOR) MISC 1 Act by Does not apply route daily.   dexlansoprazole (DEXILANT) 60 MG capsule Take 1 capsule (60 mg total) by mouth daily.   EDARBYCLOR 40-12.5 MG TABS Take 1 tablet by mouth daily.   ezetimibe (ZETIA) 10 MG tablet Take 1 tablet (10 mg total) by mouth daily.   FARXIGA 10 MG TABS tablet Take 1 tablet by mouth daily before breakfast.   gabapentin (NEURONTIN) 100 MG capsule Take 1 capsule (100 mg total) by mouth 3 (three) times daily.   HYDROcodone-acetaminophen (NORCO) 5-325 MG tablet Take 1 tablet by mouth every 6 (six) hours as needed for moderate pain.   insulin glargine, 1 Unit Dial, (TOUJEO SOLOSTAR) 300 UNIT/ML Solostar Pen Inject 26 Units into the skin daily.   insulin lispro (HUMALOG KWIKPEN) 200 UNIT/ML KwikPen Inject 5 Units into the skin 3 (three) times daily with meals. (Patient not taking: Reported on 01/19/2022)   levothyroxine (EUTHYROX) 88 MCG tablet Take 1 tablet (88 mcg total) by mouth daily.   loratadine (CLARITIN) 10 MG tablet Take 1 tablet (10 mg total) by mouth daily.   Semaglutide, 2  MG/DOSE, 8 MG/3ML SOPN Inject 2 mg as directed once a week.   ticagrelor (BRILINTA) 90 MG TABS tablet Take 1 tablet (90 mg total) by mouth 2 (two) times daily.   No facility-administered encounter medications on file as of 02/15/2022.   Patient Active Problem List   Diagnosis Date Noted   Deficiency anemia 11/26/2021   Multinodular goiter 11/02/2021   Encounter for general adult medical examination with abnormal findings 04/08/2021   Stage 3b chronic kidney disease ([place])  04/02/2020   Visit for screening mammogram 04/01/2020   Type 2 diabetes mellitus with complication, with long-term current use of insulin ([place]) 09/18/2018   Age-related osteoporosis with current pathological fracture 02/20/2018   Intractable migraine without aura and with status migrainosus 11/24/2016   GERD (gastroesophageal reflux disease) 03/08/2016   Major depressive disorder, recurrent episode ([place]) 02/20/2016   Occlusion and stenosis of carotid artery with cerebral infarction 11/25/2013   Cerebrovascular accident (CVA) due to stenosis of left middle cerebral artery ([place]) 11/02/2013   Vitamin D deficiency 10/19/2013   Tobacco abuse 07/31/2013   Essential hypertension, benign 04/20/2013   Type II diabetes mellitus with manifestations ([place]) 04/20/2013   Hypothyroidism 04/20/2013   Hyperlipidemia with target LDL less than 70 04/20/2013   Conditions to be addressed/monitored: HTN and DMII  Care Plan : RN Care Manager Plan of Care  Updates made by Knox Royalty, RN since 02/15/2022 12:00 AM     Problem: Chronic Disease Management Needs   Priority: High     Long-Range Goal: Ongoing adherence to established plan of care for long term chronic disease management   Start Date: 12/16/2021  Expected End Date: 12/17/2022  Priority: High  Note:   Current Barriers:  Chronic Disease Management support and education needs related to HTN and DMII Hospitalization 3/31--- 11/20/21 for TIA/ carotid stenosis- stent placed: discharged home to self care  RNCM Clinical Goal(s):  Patient will demonstrate ongoing health management independence as evidenced by adherence to plan of care for self-health management of HTN/ DMII        through collaboration with RN Care manager, provider, and care team.   Interventions: 1:1 collaboration with primary care provider regarding development and update of comprehensive plan of care as evidenced by provider attestation and co-signature Inter-disciplinary care team  collaboration (see longitudinal plan of care) Evaluation of current treatment plan related to  self management and patient's adherence to plan as established by provider Review of patient status, including review of consultants reports, relevant laboratory and other test results, and medications completed Care Coordination/ collaboration outreach with Clinic CSW, both prior to and after Clinic CSW outreach with patient: Clinic CSW confirms she successfully made contact with patient who denies ongoing care coordination/ care management needs; patient verbalized to clinic CSW for RN CM to cancel next scheduled appointment with her; Clinic RN CM case closure accordingly  Diabetes/ HTN:  (Status: 02/15/22: Goal Met.) Long Term Goal  Confirmed through care coordination/ collaboration with clinic CSW that patient reports good overall control of blood sugars and denies ongoing care coordination/ care management needs; confirmed patient requests cancellation of next RN CM telephone visit: visit cancelled accordingly, along with RN CM case closure  Lab Results  Component Value Date   HGBA1C 7.8 (H) 11/14/2021    Last practice recorded BP readings:  BP Readings from Last 3 Encounters:  12/30/21 128/82  12/25/21 (!) 117/56  11/26/21 126/78     Plan:  No further follow up required: confirmed through care  coordination/ collaboration with Clinic CSW that patient denies current care coordination/ care management needs and is agreeable to CCM RN CM case closure today; Clinic RN CM case closure accordingly     Oneta Rack, RN, BSN, [place] [phone]: direct office

## 2022-02-15 NOTE — Chronic Care Management (AMB) (Signed)
Care Management Clinical Social Work Note  02/15/2022 Name: Sonia Skinner MRN: [id number] DOB: 1951-02-24  Sonia Skinner is a 71 y.o. year old female who is a primary care patient of Sonia Lima, MD.  The Care Management team was consulted for assistance with chronic disease management and coordination needs.  Engaged with patient by telephone for follow up visit in response to provider referral for social work chronic care management and care coordination services  Consent to Services:  Ms. Alderfer was given information about Care Management services today including:  Care Management services includes personalized support from designated clinical staff supervised by her physician, including individualized plan of care and coordination with other care providers 24/7 contact phone numbers for assistance for urgent and routine care needs. The patient may stop case management services at any time by phone call to the office staff.  Patient agreed to services and consent obtained.   Summary:  Patient is making progress with managing symptoms of depression. She had initial appointment for psychotherapy June 29th and will have next appointment the end of July. No new needs or concerns identified during this encounter.  CCM LCSW collaborated with CCM RN  to assist with meeting patient's needs.  .  See Care Plan below for interventions and patient self-care actives.  Recommendation: Patient may benefit from, and is in agreement to keep appointments with psychiatry and therapy.   No Follow up Scheduled:  All care plan goals have been met. Will disconnect from care team after this encounter. Patient has been informed to contact the office if new needs arise. Patient does not require continued follow-up by CCM LCSW. Will contact the office if needed    Assessment: Review of patient past medical history, allergies, medications, and health status, including review of relevant consultants reports was performed  today as part of a comprehensive evaluation and provision of chronic care management and care coordination services.  SDOH (Social Determinants of Health) assessments and interventions performed:    Advanced Directives Status: Not addressed in this encounter.  Care Plan Conditions to be addressed/monitored: Depression;   Care Plan : LCSW Plan of Care  Updates made by Sonia Cane, LCSW since 02/15/2022 12:00 AM     Problem: Coping Skills      Goal: Coping Skills Enhanced Completed 02/15/2022  Start Date: 12/02/2021  This Visit's Progress: On track  Recent Progress: On track  Priority: High  Note:   Current Barriers:  Disease Management support and education needs related to Depression: anxiety Stress  and concern with memory loss and difficulty with organizing thoughts    CSW Clinical Goal(s):  Patient  will verbalize understanding of plan for management of Depression  through collaboration with Clinical Social Worker, provider, and care team.   Interventions: Inter-disciplinary care team collaboration (see longitudinal plan of care) Evaluation of current treatment plan related to  self management and patient's adherence to plan as established by provider  Mental Health:  (Status: Goal on Track (progressing): YES.) Evaluation of current treatment plan related to Depression: anxiety Completed initial appointment with [facility] June 29th  Task & activities to accomplish goals: Keep appointment with your mental health providers     Sonia Lanius, LCSW Licensed Clinical Social Worker [facility] Skinner Management  [place]  [phone]

## 2022-02-15 NOTE — Patient Instructions (Signed)
Visit Information  Congratulations on achieving your goals! It was a pleasure working with you, and I hope you continue to make great strides in improving your health. No Follow up Scheduled:  You do not require continued follow-up by care coordination team Per our conversation, I will disconnect from your care team at this time Please contact the office if needed  Following are the goals we discussed today: Managing your mental health Task & activities to accomplish goals: Keep appointment with your mental health providers     [name], LCSW Licensed Clinical Social Worker [name]  [place]  [phone]   If you are experiencing a Mental Health or [place] or need someone to talk to, please call the Suicide and Crisis Lifeline: 988 call 1-800-273-TALK (toll free, 24 hour hotline)   Patient verbalizes understanding of instructions and care plan provided today and agrees to view in [place]. Active MyChart status and patient understanding of how to access instructions and care plan via MyChart confirmed with patient.

## 2022-02-17 ENCOUNTER — Other Ambulatory Visit: Payer: Self-pay

## 2022-02-17 ENCOUNTER — Encounter (HOSPITAL_COMMUNITY): Payer: Self-pay

## 2022-02-17 ENCOUNTER — Emergency Department (HOSPITAL_COMMUNITY): Payer: Medicare Other

## 2022-02-17 ENCOUNTER — Emergency Department (HOSPITAL_COMMUNITY)
Admission: EM | Admit: 2022-02-17 | Discharge: 2022-02-17 | Disposition: A | Discharge status: Home / Self Care | Payer: Medicare Other | Attending: Emergency Medicine | Admitting: Emergency Medicine

## 2022-02-17 DIAGNOSIS — W182XXA Fall in (into) shower or empty bathtub, initial encounter: Secondary | ICD-10-CM | POA: Diagnosis not present

## 2022-02-17 DIAGNOSIS — R6889 Other general symptoms and signs: Secondary | ICD-10-CM | POA: Diagnosis not present

## 2022-02-17 DIAGNOSIS — Y92002 Bathroom of unspecified non-institutional (private) residence single-family (private) house as the place of occurrence of the external cause: Secondary | ICD-10-CM | POA: Diagnosis not present

## 2022-02-17 DIAGNOSIS — R55 Syncope and collapse: Secondary | ICD-10-CM

## 2022-02-17 DIAGNOSIS — D72829 Elevated white blood cell count, unspecified: Secondary | ICD-10-CM | POA: Insufficient documentation

## 2022-02-17 DIAGNOSIS — S0990XA Unspecified injury of head, initial encounter: Secondary | ICD-10-CM | POA: Diagnosis not present

## 2022-02-17 DIAGNOSIS — Z7902 Long term (current) use of antithrombotics/antiplatelets: Secondary | ICD-10-CM | POA: Diagnosis not present

## 2022-02-17 DIAGNOSIS — Z794 Long term (current) use of insulin: Secondary | ICD-10-CM | POA: Diagnosis not present

## 2022-02-17 DIAGNOSIS — M79601 Pain in right arm: Secondary | ICD-10-CM | POA: Diagnosis not present

## 2022-02-17 DIAGNOSIS — M5030 Other cervical disc degeneration, unspecified cervical region: Secondary | ICD-10-CM | POA: Diagnosis not present

## 2022-02-17 DIAGNOSIS — E860 Dehydration: Secondary | ICD-10-CM | POA: Diagnosis not present

## 2022-02-17 DIAGNOSIS — E119 Type 2 diabetes mellitus without complications: Secondary | ICD-10-CM | POA: Diagnosis not present

## 2022-02-17 DIAGNOSIS — M79602 Pain in left arm: Secondary | ICD-10-CM | POA: Diagnosis not present

## 2022-02-17 DIAGNOSIS — Z8673 Personal history of transient ischemic attack (TIA), and cerebral infarction without residual deficits: Secondary | ICD-10-CM | POA: Diagnosis not present

## 2022-02-17 DIAGNOSIS — S0081XA Abrasion of other part of head, initial encounter: Secondary | ICD-10-CM | POA: Diagnosis not present

## 2022-02-17 DIAGNOSIS — M79603 Pain in arm, unspecified: Secondary | ICD-10-CM | POA: Diagnosis not present

## 2022-02-17 DIAGNOSIS — R404 Transient alteration of awareness: Secondary | ICD-10-CM | POA: Diagnosis not present

## 2022-02-17 DIAGNOSIS — Z79899 Other long term (current) drug therapy: Secondary | ICD-10-CM | POA: Diagnosis not present

## 2022-02-17 DIAGNOSIS — Z043 Encounter for examination and observation following other accident: Secondary | ICD-10-CM | POA: Diagnosis not present

## 2022-02-17 DIAGNOSIS — K529 Noninfective gastroenteritis and colitis, unspecified: Secondary | ICD-10-CM

## 2022-02-17 DIAGNOSIS — Z7982 Long term (current) use of aspirin: Secondary | ICD-10-CM | POA: Insufficient documentation

## 2022-02-17 DIAGNOSIS — R109 Unspecified abdominal pain: Secondary | ICD-10-CM | POA: Diagnosis not present

## 2022-02-17 DIAGNOSIS — I10 Essential (primary) hypertension: Secondary | ICD-10-CM | POA: Diagnosis not present

## 2022-02-17 LAB — CBC WITH DIFFERENTIAL/PLATELET
Abs Immature Granulocytes: 0.01 10*3/uL (ref 0.00–0.07)
Basophils Absolute: 0.1 10*3/uL (ref 0.0–0.1)
Basophils Relative: 1 %
Eosinophils Absolute: 0.2 10*3/uL (ref 0.0–0.5)
Eosinophils Relative: 3 %
HCT: 36.7 % (ref 36.0–46.0)
Hemoglobin: 11.1 g/dL — ABNORMAL LOW (ref 12.0–15.0)
Immature Granulocytes: 0 %
Lymphocytes Relative: 45 %
Lymphs Abs: 4.4 10*3/uL — ABNORMAL HIGH (ref 0.7–4.0)
MCH: 26.5 pg (ref 26.0–34.0)
MCHC: 30.2 g/dL (ref 30.0–36.0)
MCV: 87.6 fL (ref 80.0–100.0)
Monocytes Absolute: 0.8 10*3/uL (ref 0.1–1.0)
Monocytes Relative: 8 %
Neutro Abs: 4.1 10*3/uL (ref 1.7–7.7)
Neutrophils Relative %: 43 %
Platelets: 200 10*3/uL (ref 150–400)
RBC: 4.19 MIL/uL (ref 3.87–5.11)
RDW: 13.4 % (ref 11.5–15.5)
WBC: 9.6 10*3/uL (ref 4.0–10.5)
nRBC: 0 % (ref 0.0–0.2)

## 2022-02-17 LAB — URINALYSIS, ROUTINE W REFLEX MICROSCOPIC
Bacteria, UA: NONE SEEN
Bilirubin Urine: NEGATIVE
Glucose, UA: >=500 mg/dL — AB
Hgb urine dipstick: NEGATIVE
Ketones, ur: NEGATIVE mg/dL
Leukocytes,Ua: NEGATIVE
Nitrite: NEGATIVE
Protein, ur: NEGATIVE mg/dL
Specific Gravity, Urine: 1.009 (ref 1.005–1.030)
pH: 5 (ref 5.0–8.0)

## 2022-02-17 LAB — COMPREHENSIVE METABOLIC PANEL
ALT: 14 U/L (ref 0–44)
AST: 13 U/L — ABNORMAL LOW (ref 15–41)
Albumin: 3.4 g/dL — ABNORMAL LOW (ref 3.5–5.0)
Alkaline Phosphatase: 114 U/L (ref 38–126)
Anion gap: 15 (ref 5–15)
BUN: 29 mg/dL — ABNORMAL HIGH (ref 8–23)
CO2: 22 mmol/L (ref 22–32)
Calcium: 9.6 mg/dL (ref 8.9–10.3)
Chloride: 107 mmol/L (ref 98–111)
Creatinine, Ser: 1.98 mg/dL — ABNORMAL HIGH (ref 0.44–1.00)
GFR, Estimated: 26 mL/min — ABNORMAL LOW (ref >60)
Glucose, Bld: 162 mg/dL — ABNORMAL HIGH (ref 70–99)
Potassium: 3.9 mmol/L (ref 3.5–5.1)
Sodium: 144 mmol/L (ref 135–145)
Total Bilirubin: 0.3 mg/dL (ref 0.3–1.2)
Total Protein: 6.4 g/dL — ABNORMAL LOW (ref 6.5–8.1)

## 2022-02-17 LAB — LIPASE, BLOOD: Lipase: 52 U/L — ABNORMAL HIGH (ref 11–51)

## 2022-02-17 MED ORDER — SODIUM CHLORIDE 0.9 % IV BOLUS
1000.0000 mL | Freq: Once | INTRAVENOUS | Status: AC
Start: 2022-02-17 — End: 2022-02-17
  Administered 2022-02-17: 1000 mL via INTRAVENOUS

## 2022-02-17 MED ORDER — LOPERAMIDE HCL 2 MG PO CAPS: 2.0000 mg | ORAL_CAPSULE | Freq: Four times a day (QID) | ORAL | 0 refills | Status: DC | PRN

## 2022-02-17 MED ORDER — ACETAMINOPHEN 325 MG PO TABS
650.0000 mg | ORAL_TABLET | Freq: Once | ORAL | Status: AC
Start: 2022-02-17 — End: 2022-02-17
  Administered 2022-02-17: 650 mg via ORAL
  Filled 2022-02-17: qty 2

## 2022-02-17 NOTE — ED Notes (Signed)
Pt was able to hold down the fluids wo n/v.

## 2022-02-17 NOTE — ED Provider Notes (Signed)
[facility] EMERGENCY DEPARTMENT Provider Note   CSN: [id number] Arrival date & time: 02/17/22  0429     History  Chief Complaint  Patient presents with   Fall   Loss of Consciousness    Sonia Skinner is a 71 y.o. female.  HPI    This is a 71 year old female who presents following a fall.  Per EMS she had an unwitnessed fall in the bathroom.  She has had a several day history of nonbloody diarrhea and abdominal pain.  She was in the bathroom.  Patient states that she stood up and fell to the floor.  She does believe she may have lost consciousness.  She reports bilateral arm pain.  Denies headache.  Denies nausea or vomiting.  She is on Plavix for prior stroke.  Home Medications Prior to Admission medications   Medication Sig Start Date End Date Taking? Authorizing Provider  Accu-Chek FastClix Lancets MISC Use to check blood sugar five times daily. 04/21/21   [name], MD  acetaminophen (TYLENOL) 500 MG tablet Take 1,000-1,500 mg by mouth every 6 (six) hours as needed for mild pain.    [provider]  amitriptyline (ELAVIL) 100 MG tablet Take 1 tablet (100 mg total) by mouth at bedtime. 12/14/21   Arfeen, Arlyce Harman, MD  amLODipine (NORVASC) 5 MG tablet Take 1 tablet (5 mg total) by mouth daily. 08/26/21   Freada Bergeron, MD  aspirin 81 MG chewable tablet Chew 1 tablet (81 mg total) by mouth daily. 11/21/21   Spero Geralds, MD  atorvastatin (LIPITOR) 80 MG tablet Take 0.5 tablets (40 mg total) by mouth daily. 11/20/21   Spero Geralds, MD  buPROPion (WELLBUTRIN XL) 300 MG 24 hr tablet Take 1 tablet (300 mg total) by mouth daily. 12/14/21   Arfeen, Arlyce Harman, MD  Cholecalciferol (VITAMIN D3 PO) Take 1 tablet by mouth daily. Unsure of dose    [provider]  Continuous Blood Gluc Receiver (DEXCOM G7 RECEIVER) Thompson 1 Act by Does not apply route daily. 02/08/22   [name], MD  Continuous Blood Gluc Sensor (DEXCOM G7 SENSOR) MISC 1 Act by Does not  apply route daily. 11/26/21   [name], MD  Continuous Blood Gluc Sensor (DEXCOM G7 SENSOR) MISC 1 Act by Does not apply route daily. 02/08/22   [name], MD  dexlansoprazole (DEXILANT) 60 MG capsule Take 1 capsule (60 mg total) by mouth daily. 02/08/22   [name], MD  EDARBYCLOR 40-12.5 MG TABS Take 1 tablet by mouth daily. 12/22/21   [name], MD  ezetimibe (ZETIA) 10 MG tablet Take 1 tablet (10 mg total) by mouth daily. 11/21/21   Spero Geralds, MD  FARXIGA 10 MG TABS tablet Take 1 tablet by mouth daily before breakfast. 09/19/21   [name], MD  gabapentin (NEURONTIN) 100 MG capsule Take 1 capsule (100 mg total) by mouth 3 (three) times daily. 02/08/22   [name], MD  HYDROcodone-acetaminophen (NORCO) 5-325 MG tablet Take 1 tablet by mouth every 6 (six) hours as needed for moderate pain. 01/19/22   Henson, Vickie L, NP-C  insulin glargine, 1 Unit Dial, (TOUJEO SOLOSTAR) 300 UNIT/ML Solostar Pen Inject 26 Units into the skin daily. 02/09/22   Philemon Kingdom, MD  insulin lispro (HUMALOG KWIKPEN) 200 UNIT/ML KwikPen Inject 5 Units into the skin 3 (three) times daily with meals. Patient not taking: Reported on 01/19/2022 11/26/21   Ronnald Ramp,  Arvid Right, MD  levothyroxine (EUTHYROX) 88 MCG tablet Take 1 tablet (88 mcg total) by mouth daily. 02/08/22   [name], MD  loratadine (CLARITIN) 10 MG tablet Take 1 tablet (10 mg total) by mouth daily. 04/21/21   [name], MD  Semaglutide, 2 MG/DOSE, 8 MG/3ML SOPN Inject 2 mg as directed once a week. 02/08/22   [name], MD  ticagrelor (BRILINTA) 90 MG TABS tablet Take 1 tablet (90 mg total) by mouth 2 (two) times daily. 11/20/21   Spero Geralds, MD      Allergies    Anesthetics, ester    Review of Systems   Review of Systems  Neurological:  Positive for syncope.  All other systems reviewed and are negative.   Physical Exam Updated Vital Signs BP (!) 119/46   Pulse 77   Temp 97.9 F (36.6 C) (Oral)    Resp 19   Ht 1.575 m ('5\' 2"'$ )   Wt 62.6 kg   SpO2 100%   BMI 25.24 kg/m  Physical Exam Vitals and nursing note reviewed.  Constitutional:      Appearance: She is well-developed.  HENT:     Head: Normocephalic.     Comments: Slight abrasion over the forehead    Mouth/Throat:     Mouth: Mucous membranes are moist.  Eyes:     Pupils: Pupils are equal, round, and reactive to light.  Cardiovascular:     Rate and Rhythm: Normal rate and regular rhythm.     Heart sounds: Normal heart sounds.  Pulmonary:     Effort: Pulmonary effort is normal. No respiratory distress.     Breath sounds: No wheezing.  Abdominal:     Palpations: Abdomen is soft.  Musculoskeletal:        General: No deformity.     Cervical back: Neck supple.     Right lower leg: No edema.     Left lower leg: No edema.  Skin:    General: Skin is warm and dry.  Neurological:     Mental Status: She is alert and oriented to person, place, and time.     Comments: Cranial nerves II through XII intact, 5 out of 5 strength in all 4 extremities, no dysmetria to finger-nose-finger  Psychiatric:        Mood and Affect: Mood normal.     ED Results / Procedures / Treatments   Labs (all labs ordered are listed, but only abnormal results are displayed) Labs Reviewed  CBC WITH DIFFERENTIAL/PLATELET - Abnormal; Notable for the following components:      Result Value   Hemoglobin 11.1 (*)    Lymphs Abs 4.4 (*)    All other components within normal limits  COMPREHENSIVE METABOLIC PANEL - Abnormal; Notable for the following components:   Glucose, Bld 162 (*)    BUN 29 (*)    Creatinine, Ser 1.98 (*)    Total Protein 6.4 (*)    Albumin 3.4 (*)    AST 13 (*)    GFR, Estimated 26 (*)    All other components within normal limits  LIPASE, BLOOD - Abnormal; Notable for the following components:   Lipase 52 (*)    All other components within normal limits  URINALYSIS, ROUTINE W REFLEX MICROSCOPIC    EKG EKG  Interpretation  Date/Time:  Wednesday February 17 2022 04:36:25 EDT Ventricular Rate:  75 PR Interval:  186 QRS Duration: 85 QT Interval:  364 QTC Calculation: 407 R Axis:  62 Text Interpretation: Sinus rhythm Confirmed by Thayer Jew [phone]) on 02/17/2022 6:40:59 AM  Radiology CT ABDOMEN PELVIS WO CONTRAST  Result Date: 02/17/2022 CLINICAL DATA:  Nausea and vomiting. Abdominal pain and diarrhea for 3 days. EXAM: CT ABDOMEN AND PELVIS WITHOUT CONTRAST TECHNIQUE: Multidetector CT imaging of the abdomen and pelvis was performed following the standard protocol without IV contrast. RADIATION DOSE REDUCTION: This exam was performed according to the departmental dose-optimization program which includes automated exposure control, adjustment of the mA and/or kV according to patient size and/or use of iterative reconstruction technique. COMPARISON:  None Available. FINDINGS: Lower chest: No acute abnormality. Hepatobiliary: No focal liver abnormality. Small stones identified within the gallbladder, image 27/3. No gallbladder distension, wall thickening or inflammation within the limitations of unenhanced technique. No signs of bile duct dilatation. Pancreas: Unremarkable. No pancreatic ductal dilatation or surrounding inflammatory changes. Spleen: Normal in size without focal abnormality. Adrenals/Urinary Tract: Normal adrenal glands. No nephrolithiasis, hydronephrosis or mass identified bilaterally. No signs of hydroureter or ureteral lithiasis. Stomach/Bowel: Evaluation of bowel pathology is limited due to lack of IV and enteric contrast material. The stomach appears unremarkable. The appendix is not confidently identified. No pathologic dilatation of the large or small bowel loops to suggest obstruction. Decompressed colon with mild diffuse wall thickening and intramural fatty deposition is identified. No surrounding inflammatory fat stranding or free fluid. Vascular/Lymphatic: Aortic atherosclerosis. No  aneurysm. No signs of abdominopelvic adenopathy. Reproductive: Status post hysterectomy. No adnexal masses. Other: No free fluid or fluid collections. No signs of pneumoperitoneum. Musculoskeletal: Mild curvature of the thoracolumbar spine is convex towards the right. Lumbar degenerative disc disease. No acute or suspicious osseous findings identified. IMPRESSION: 1. Assessment for bowel pathology limited due to lack of IV and oral contrast. Decompressed colon with mild diffuse wall thickening and intramural fatty deposition. No surrounding inflammatory fat stranding or free fluid. Findings are nonspecific but may reflect colitis. 2. Cholelithiasis. 3. Aortic Atherosclerosis (ICD10-I70.0). Electronically Signed   By: Kerby Moors M.D.   On: 02/17/2022 07:00   CT Head Wo Contrast  Result Date: 02/17/2022 CLINICAL DATA:  Status post fall. EXAM: CT HEAD WITHOUT CONTRAST CT CERVICAL SPINE WITHOUT CONTRAST TECHNIQUE: Multidetector CT imaging of the head and cervical spine was performed following the standard protocol without intravenous contrast. Multiplanar CT image reconstructions of the cervical spine were also generated. RADIATION DOSE REDUCTION: This exam was performed according to the departmental dose-optimization program which includes automated exposure control, adjustment of the mA and/or kV according to patient size and/or use of iterative reconstruction technique. COMPARISON:  11/25/2021 FINDINGS: CT HEAD FINDINGS Brain: No evidence of acute infarction, hemorrhage, hydrocephalus, extra-axial collection or mass lesion/mass effect. Remote left parietal infarct appears unchanged, image 22/4. Vascular: No hyperdense vessel or unexpected calcification. Skull: Normal. Negative for fracture or focal lesion. Sinuses/Orbits: No acute finding. Other: None CT CERVICAL SPINE FINDINGS Alignment: Normal. Skull base and vertebrae: No acute fracture. No primary bone lesion or focal pathologic process. Soft tissues and  spinal canal: No prevertebral fluid or swelling. No visible canal hematoma. Disc levels: There is solid fusion of the C6-7 vertebra. Disc space narrowing and endplate spurring identified at C3-4, C5-6 and C7-T1. Upper chest: Negative. Other: Atherosclerotic calcifications identified within the coronary arteries. Stent noted within the left carotid artery. 1.1 cm soft tissue nodule in the left parotid gland is identified, unchanged compared with 11/03/2013. 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 IMPRESSION: 1. No acute intracranial abnormalities. 2. Remote  left parietal infarct. 3. No evidence for cervical spine fracture or subluxation. 4. Advanced cervical degenerative disc disease. Electronically Signed   By: Kerby Moors M.D.   On: 02/17/2022 05:11   CT Cervical Spine Wo Contrast  Result Date: 02/17/2022 CLINICAL DATA:  Status post fall. EXAM: CT HEAD WITHOUT CONTRAST CT CERVICAL SPINE WITHOUT CONTRAST TECHNIQUE: Multidetector CT imaging of the head and cervical spine was performed following the standard protocol without intravenous contrast. Multiplanar CT image reconstructions of the cervical spine were also generated. RADIATION DOSE REDUCTION: This exam was performed according to the departmental dose-optimization program which includes automated exposure control, adjustment of the mA and/or kV according to patient size and/or use of iterative reconstruction technique. COMPARISON:  11/25/2021 FINDINGS: CT HEAD FINDINGS Brain: No evidence of acute infarction, hemorrhage, hydrocephalus, extra-axial collection or mass lesion/mass effect. Remote left parietal infarct appears unchanged, image 22/4. Vascular: No hyperdense vessel or unexpected calcification. Skull: Normal. Negative for fracture or focal lesion. Sinuses/Orbits: No acute finding. Other: None CT CERVICAL SPINE FINDINGS Alignment: Normal. Skull base and vertebrae: No acute fracture. No primary bone lesion or  focal pathologic process. Soft tissues and spinal canal: No prevertebral fluid or swelling. No visible canal hematoma. Disc levels: There is solid fusion of the C6-7 vertebra. Disc space narrowing and endplate spurring identified at C3-4, C5-6 and C7-T1. Upper chest: Negative. Other: Atherosclerotic calcifications identified within the coronary arteries. Stent noted within the left carotid artery. 1.1 cm soft tissue nodule in the left parotid gland is identified, unchanged compared with 11/03/2013. 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 IMPRESSION: 1. No acute intracranial abnormalities. 2. Remote left parietal infarct. 3. No evidence for cervical spine fracture or subluxation. 4. Advanced cervical degenerative disc disease. Electronically Signed   By: Kerby Moors M.D.   On: 02/17/2022 05:11    Procedures Procedures    Medications Ordered in ED Medications  sodium chloride 0.9 % bolus 1,000 mL (0 mLs Intravenous Stopped 02/17/22 0634)  acetaminophen (TYLENOL) tablet 650 mg (650 mg Oral Given 02/17/22 1751)    ED Course/ Medical Decision Making/ A&P                           Medical Decision Making Amount and/or Complexity of Data Reviewed Labs: ordered. Radiology: ordered.  Risk OTC drugs.   This patient presents to the ED for concern of syncope, fall, this involves an extensive number of treatment options, and is a complaint that carries with it a high risk of complications and morbidity.  I considered the following differential and admission for this acute, potentially life threatening condition.  The differential diagnosis includes arrhythmia, vasovagal episode, orthostasis, dehydration  MDM:    This is a 71 year old female who presents following an episode of loss of consciousness.  She is nontoxic and vital signs are reassuring.  She reports recent diarrheal illness.  She did lose consciousness.  EKG shows normal sinus rhythm without evidence of  arrhythmia.  She has no chest pain or shortness of breath.  She does report some abdominal discomfort.  Labs are largely reassuring including absence of leukocytosis, normal LFTs, and lipase.  CT scan obtained and shows nonspecific findings due to lack of contrast; however, may be consistent with colitis.  Would highly suspect viral etiology as the patient is otherwise well-appearing.  She was given fluids.  She was ambulated and able to tolerate p.o.  Will discharge home.  Reports  pain in the bilateral antecubital fossa's.  Does not fall on her arms.  No deformities.  Do not feel she warrants x-ray imaging at this time.  (Labs, imaging, consults)  Labs: I Ordered, and personally interpreted labs.  The pertinent results include: CBC, CMP, lipase  Imaging Studies ordered: I ordered imaging studies including CT abdomen pelvis without contrast I independently visualized and interpreted imaging. I agree with the radiologist interpretation  Additional history obtained from sister at bedside.  External records from outside source obtained and reviewed including chart review  Cardiac Monitoring: The patient was maintained on a cardiac monitor.  I personally viewed and interpreted the cardiac monitored which showed an underlying rhythm of: Normal sinus rhythm  Reevaluation: After the interventions noted above, I reevaluated the patient and found that they have :improved  Social Determinants of Health: Lives independently  Disposition: Discharge  Co morbidities that complicate the patient evaluation  Past Medical History:  Diagnosis Date   Ankle fracture    Left   Anxiety    Carotid artery occlusion    Closed fracture of left distal fibula 11/17/9673   Complication of anesthesia    ALLERY TO ESTER BASE   Depression    early 15s   Diabetes mellitus    INSULIN DEPENDENT   GERD (gastroesophageal reflux disease)    Headache    years ago   Hypertension    Pneumonia    Stroke [facility]) March  2015    left MCA infarct, slight weakness on left side   Thyroid disease      Medicines Meds ordered this encounter  Medications   sodium chloride 0.9 % bolus 1,000 mL   acetaminophen (TYLENOL) tablet 650 mg    I have reviewed the patients home medicines and have made adjustments as needed  Problem List / ED Course: Problem List Items Addressed This Visit   None               Final Clinical Impression(s) / ED Diagnoses Final diagnoses:  None    Rx / DC Orders ED Discharge Orders     None         Merryl Hacker, MD 02/17/22 [phone]

## 2022-02-17 NOTE — ED Notes (Signed)
Patient transported to CT 

## 2022-02-17 NOTE — ED Triage Notes (Signed)
Pt BIB [facility] from home after unwitnessed fall (on Plavix) in bathroom. Pt's sister heard the fall and believes pt LOC. Pt c/o abd pain and diarrhea x3 days. Pt is lethargic. A&Ox4, VSS

## 2022-02-17 NOTE — Discharge Instructions (Signed)
You were seen today for an episode of syncope.  This is likely related to vasovagal syncope and/or dehydration given your recent diarrhea.  Take Imodium as needed for diarrhea.  Make sure that you are staying hydrated.

## 2022-02-17 NOTE — Progress Notes (Signed)
Chaplain responded to Level 2.Fall on blood thinners.  Sister is on the way.  Chaplain not needed at this time. Rev. Tamsen Snider Pager [phone]

## 2022-02-17 NOTE — ED Notes (Signed)
Pt ambulated w/ steady gait.

## 2022-02-17 NOTE — Progress Notes (Signed)
Orthopedic Tech Progress Note Patient Details:  Sonia Skinner 1951/03/20 [id number]  Patient ID: [name], female   DOB: May 13, 1951, 71 y.o.   MRN: [id number] Level 2 trauma. Ashish Rossetti L Makail Watling 02/17/2022, 4:44 AM

## 2022-02-18 ENCOUNTER — Encounter: Payer: Self-pay | Admitting: Physician Assistant

## 2022-02-18 ENCOUNTER — Ambulatory Visit (INDEPENDENT_AMBULATORY_CARE_PROVIDER_SITE_OTHER): Payer: Medicare Other | Admitting: Physician Assistant

## 2022-02-18 VITALS — BP 99/62 | HR 62 | Resp 20 | Ht 62.0 in | Wt 130.0 lb

## 2022-02-18 DIAGNOSIS — G459 Transient cerebral ischemic attack, unspecified: Secondary | ICD-10-CM

## 2022-02-18 NOTE — Progress Notes (Signed)
NEUROLOGY FOLLOW UP OFFICE NOTE  Sonia Skinner [id number]  Assessment/Plan:     Continue Plavix 75 mg daily  Continue good control of cardiovascular risk factors  Monitor mood as per psychiatry /CBT Follow with PCP on all other chronic medical issues  Follow up as needed   History of present illness Patient returns in  6 months follow up after initial evaluation for L sided numbness and weakness which has now resolved. . At the time, a carotid doppler had shown  mild stenosis in the right carotid artery, moderate 60 to 79% left ICA stenosis. She was referred to [facility] but she failed to follow up. She had prior B endarterectomy in 2015.  On 11/14/21 she presented to the ED with dysarthria resolved after 30 mins. Neuro consult stated that is was unclear what to make of the presentation, but this could potentially be due to LICA plaque, rule out TIA/CVA. MRI brain was performed which was negative for acute stroke. MRA of the head neck showed a 70% stenosis of the proximal cervical ICA.  There is also erythematous changes of the bilateral carotid siphons and associated stenosis of up to 50%.. [facility] evaluated patient on 11/19/21 recommending cerebral angiography with angioplasty and stenting of the left ICA. She is currently Plavix 75 mg daily . Patient is uncooperative today. She responds by nodding yes or no. She states that she does not know why since Jan to April was not seen by [facility], but in looking at the notes several attempts to make that apt were made and eventually the sent a letter to the patient by them. Successfully this procedure took place during the last hospitalization in April 2023.  She denies any L sided numbness or tingling, aphasia, dysarthria or any other signs or symptoms of stroke. Was unable to obtain any further information, unclear of why she is not wishing to participate, but she is not collaborating with her answers. She has been instructed to return as needed     History of  Present Illness:  Sonia Skinner is a 71 y.o. L handed woman with a history of CVA in 2015, with residual left-sided weakness, anxiety, history of left carotid artery occlusion, prior left CEA, history of diabetes, hypertension, history of prior cervical spine anterior fixation with posterior microdiscectomy and thyroid disease, who presented to the ED on 08/13/2021 with acute onset of sensory dullness on the entire left side.  She also endorsed all left-sided facial weakness from her prior stroke, and chronic left upper extremity weakness, without lower extremity weakness.  She also reported drooling sensation resolved on presentation to the ED.  CT of the head showed no acute abnormalities, MRI was only remarkable for prior stroke, without new acute findings.  Carotid Dopplers showed mild stenosis in the right carotid artery, moderate 60 to 79% left ICA stenosis.  2D echo showed EF 70 to 75% with left ventricle hyperdynamic function, HbA1c 8.5.  She was treated conservatively.  She was evaluated by stroke specialist Dr. Erlinda Hong with note that etiology of symptoms not quite clear, concerning for anxiety, however cannot rule out TIA. She does have risk factors for TIA, with bilateral ICA siphon stenosis, hypertension, hyperlipidemia, DM, and was discharged on 08/14/2021 with significant improvement of her symptoms, "much less numbness and tingling and drooping of the face."  She was discharged on statin, DAPT ASA 81 mg, and Plavix 75 mg for 21 days, then resuming aspirin 325 mg daily. Denies vertigo dizziness, vision changes or  dysarthria. No confusion or seizures. Denies any chest pain, or shortness of breath or recent long distance trips. Denies tobacco. No new meds or hormonal supplements. She has mild dysphagia that has been present since her stroke in 2015 but does not affect her speech. She is compliant with her medications .  Denies any recent surgeries. No sick contacts. No new stressors present in personal life  but she is still working as a Pharmacist, hospital [employer] which "can increase my anxiety at times". She notices that has been very "jittery" lately. She has mild tremors related to anxiety. He memory is "about the same", at times "taking the wrong turn when driving". She has been evaluated at our office in 2019, and her memory issues were felt to be due to multiple causes including prior stroke, anxiety, diabetes, thyroid levels and depression. Many of these problems are still being worked up except her thyroid levels. She has a history of migraine headaches worse when BP is high, during those time her vision becomes "blurry", then resolving when BP normalizes .  Denies any family history of stroke     Out-side paper records, electronic medical record, and images have been reviewed where available and summarized as:    TSH 0.053 (low) Lipid panel shows total cholesterol 196, triglycerides elevated at 234, HDL low at 40, and LDL 109. CBC showed a white count of 12.2, hemoglobin 11.1, hematocrit 35.4 normal platelets PT 13.8, INR 1.1 Hemoglobin A1c was 8.5 COVID-negative   MRI of the brain without contrast 08/14/2021 shows no acute or reversible findings, remote left frontoparietal cortex infarct   MRA of the head 08/14/2021 without acute findings, bilateral carotid artery stenosis, stable since 2015 at 50%    Carotid Doppler  right ICA are consistent with a 1-39% stenosis. Left Carotid: Velocities in the left ICA are consistent with a 60-79% stenosis.  Symptomatic left ICA stenosis with h/o CEA on same side.     2D Echo EF 70-75% LV hyperdynamic function       Initial visit for memory issues 05/04/18 This is a 71 year old left-handed woman with a history of left MCA stroke secondary to left ICA stenosis s/p CEA in 2015, hypertension, hyperlipidemia, diabetes, hypothyroidism, presenting for evaluation of worsening memory. She had been seeing neurologist Dr. Leonie Man for several years and had reported  memory difficulties since her stroke, and underwent Neuropsychological testing with Dr. Valentina Shaggy in April 2016, it was noted that "cognitive difficulties are likely due to a combination of psychological and neurological factors. It is probable that depression, fatigue, anxiety, and frustration, which to some extent pre-dated her stroke, are significantly disrupting her everyday cognitive functioning. While her neuropsychological deficit profile was indicative of a right brain stroke, neuroradiological studies showed left hemisphere infarcts. This discrepancy cannot be explained. The intactness of her language functioning might be explained by assuming that this left-handed woman is right brain dominant for language. Finally, her diabetes, especially if poorly controlled, could also be contributing to her cognitive difficulties." She reports that she loses her train of thought at least once a day. She misplaces things frequently. She has been told by her sister that she repeats herself. She forgot her doctor's appointment last week. She has missed a bill payment once or twice. She occasionally misses medications, if she does not keep the pillbox in front of her, she would forget it. Her last HbA1c was very elevated 15.6. She denies getting lost driving. No family history of dementia or stroke. She  recalls a head injury 10 years ago where she hit a wall and broke her teeth. She rarely drinks alcohol. Although the stroke was in the left MCA territory, she reports that it feels that her left side is affected more. Her handwriting is different, she has numbness, tingling, and slight weakness in her left hand and leg. She has headaches in the occipital region with throbbing or sharp/stabbing pain 1-2 times a week, lasting up to an hour. No associated nausea/vomiting, vision changes. She does not take any medication for the headache. She has dizziness that she believes is related to her boot on the left foot. She broke her  ankle in January 2019 and continues to wear a boot. She has occasional difficulty swallowing, occasional blurred vision. She denies any diplopia, dysarthria, neck/back pain, bowel/bladder dysfunction, anosmia, or tremors. She is scheduled to see [facility] for follow-up on carotid arteries at the end of the month. Mood is not so good.    PAST MEDICAL HISTORY: Past Medical History:  Diagnosis Date   Ankle fracture    Left   Anxiety    Carotid artery occlusion    Closed fracture of left distal fibula 01/17/4033   Complication of anesthesia    ALLERY TO ESTER BASE   Depression    early 53s   Diabetes mellitus    INSULIN DEPENDENT   GERD (gastroesophageal reflux disease)    Headache    years ago   Hypertension    Pneumonia    Stroke [facility]) March 2015    left MCA infarct, slight weakness on left side   Thyroid disease     MEDICATIONS: Current Outpatient Medications on File Prior to Visit  Medication Sig Dispense Refill   Accu-Chek FastClix Lancets MISC Use to check blood sugar five times daily. 510 each 2   acetaminophen (TYLENOL) 500 MG tablet Take 1,000-1,500 mg by mouth every 6 (six) hours as needed for mild pain.     amitriptyline (ELAVIL) 100 MG tablet Take 1 tablet (100 mg total) by mouth at bedtime. 90 tablet 0   amLODipine (NORVASC) 5 MG tablet Take 1 tablet (5 mg total) by mouth daily. 90 tablet 3   aspirin 81 MG chewable tablet Chew 1 tablet (81 mg total) by mouth daily. 30 tablet 1   atorvastatin (LIPITOR) 80 MG tablet Take 0.5 tablets (40 mg total) by mouth daily. 30 tablet 1   buPROPion (WELLBUTRIN XL) 300 MG 24 hr tablet Take 1 tablet (300 mg total) by mouth daily. 90 tablet 0   Cholecalciferol (VITAMIN D3 PO) Take 1 tablet by mouth daily. Unsure of dose     Continuous Blood Gluc Receiver (DEXCOM G7 RECEIVER) DEVI 1 Act by Does not apply route daily. 2 each 5   Continuous Blood Gluc Sensor (DEXCOM G7 SENSOR) MISC 1 Act by Does not apply route daily. 2 each 5   Continuous  Blood Gluc Sensor (DEXCOM G7 SENSOR) MISC 1 Act by Does not apply route daily. 2 each 5   dexlansoprazole (DEXILANT) 60 MG capsule Take 1 capsule (60 mg total) by mouth daily. 90 capsule 0   EDARBYCLOR 40-12.5 MG TABS Take 1 tablet by mouth daily. 90 tablet 0   ezetimibe (ZETIA) 10 MG tablet Take 1 tablet (10 mg total) by mouth daily. 30 tablet 1   FARXIGA 10 MG TABS tablet Take 1 tablet by mouth daily before breakfast. 90 tablet 1   gabapentin (NEURONTIN) 100 MG capsule Take 1 capsule (100 mg total) by mouth  3 (three) times daily. 270 capsule 1   HYDROcodone-acetaminophen (NORCO) 5-325 MG tablet Take 1 tablet by mouth every 6 (six) hours as needed for moderate pain. 10 tablet 0   insulin glargine, 1 Unit Dial, (TOUJEO SOLOSTAR) 300 UNIT/ML Solostar Pen Inject 26 Units into the skin daily. 18 mL 3   insulin lispro (HUMALOG KWIKPEN) 200 UNIT/ML KwikPen Inject 5 Units into the skin 3 (three) times daily with meals. (Patient not taking: Reported on 01/19/2022) 9 mL 1   levothyroxine (EUTHYROX) 88 MCG tablet Take 1 tablet (88 mcg total) by mouth daily. 90 tablet 0   loperamide (IMODIUM) 2 MG capsule Take 1 capsule (2 mg total) by mouth 4 (four) times daily as needed for diarrhea or loose stools. 12 capsule 0   loratadine (CLARITIN) 10 MG tablet Take 1 tablet (10 mg total) by mouth daily. 90 tablet 0   Semaglutide, 2 MG/DOSE, 8 MG/3ML SOPN Inject 2 mg as directed once a week. 9 mL 1   ticagrelor (BRILINTA) 90 MG TABS tablet Take 1 tablet (90 mg total) by mouth 2 (two) times daily. 60 tablet 1   No current facility-administered medications on file prior to visit.    ALLERGIES: Allergies  Allergen Reactions   Anesthetics, Ester Anaphylaxis    FAMILY HISTORY: Family History  Problem Relation Age of Onset   Diabetes Mother    Heart disease Mother        Before age 18   Cancer Father        Lung   Hypertension Sister    Diabetes Sister    Diabetes Sister    Diabetes Sister     Objective:    General: No acute distress.  Patient appears well groomed. Apathetic and anxious demeanor today  Head:  Normocephalic/atraumatic Eyes:  Fundi examined but not visualized Neck: supple, no paraspinal tenderness, full range of motion Heart:  Regular rate and rhythm Lungs:  Clear to auscultation bilaterally Back: No paraspinal tenderness Neurological Exam: alert and oriented.Attention span and concentration intact. Speech fluent and not dysarthric, language intact.  CN II-XII intact. Bulk and tone normal, muscle strength 5/5 throughout.  Sensation to light touch, temperature and vibration intact.  Deep tendon reflexes 2+ throughout, toes downgoing.  Finger to nose and heel to shin testing intact.  Gait normal, Romberg negative.   Sharene Butters, PA-C  CC: No primary care provider on file.   Time spent 20 mins

## 2022-02-18 NOTE — Patient Instructions (Addendum)
Follow up as needed Continue taking the Aspirin and other blood thinners as indicated by your primary doctor  Continue taking the Lipitor and BP medications

## 2022-02-22 ENCOUNTER — Telehealth: Payer: Medicare Other

## 2022-02-24 ENCOUNTER — Other Ambulatory Visit: Payer: Self-pay | Admitting: Internal Medicine

## 2022-02-25 ENCOUNTER — Encounter: Payer: Self-pay | Admitting: Internal Medicine

## 2022-02-25 ENCOUNTER — Ambulatory Visit (INDEPENDENT_AMBULATORY_CARE_PROVIDER_SITE_OTHER): Payer: Medicare Other | Admitting: Internal Medicine

## 2022-02-25 VITALS — BP 130/58 | HR 57 | Temp 98.6°F | Ht 62.0 in | Wt 131.6 lb

## 2022-02-25 DIAGNOSIS — E785 Hyperlipidemia, unspecified: Secondary | ICD-10-CM | POA: Diagnosis not present

## 2022-02-25 DIAGNOSIS — N1832 Chronic kidney disease, stage 3b: Secondary | ICD-10-CM

## 2022-02-25 DIAGNOSIS — E118 Type 2 diabetes mellitus with unspecified complications: Secondary | ICD-10-CM | POA: Diagnosis not present

## 2022-02-25 DIAGNOSIS — Z794 Long term (current) use of insulin: Secondary | ICD-10-CM

## 2022-02-25 DIAGNOSIS — E039 Hypothyroidism, unspecified: Secondary | ICD-10-CM | POA: Diagnosis not present

## 2022-02-25 DIAGNOSIS — Z23 Encounter for immunization: Secondary | ICD-10-CM

## 2022-02-25 LAB — URINALYSIS, ROUTINE W REFLEX MICROSCOPIC
Bilirubin Urine: NEGATIVE
Hgb urine dipstick: NEGATIVE
Ketones, ur: NEGATIVE
Leukocytes,Ua: NEGATIVE
Nitrite: NEGATIVE
RBC / HPF: NONE SEEN (ref 0–?)
Specific Gravity, Urine: 1.01 (ref 1.000–1.030)
Total Protein, Urine: NEGATIVE
Urine Glucose: >=1000 — AB
Urobilinogen, UA: 0.2 (ref 0.0–1.0)
WBC, UA: NONE SEEN (ref 0–?)
pH: 6 (ref 5.0–8.0)

## 2022-02-25 LAB — BASIC METABOLIC PANEL
BUN: 27 mg/dL — ABNORMAL HIGH (ref 6–23)
CO2: 25 mEq/L (ref 19–32)
Calcium: 10 mg/dL (ref 8.4–10.5)
Chloride: 107 mEq/L (ref 96–112)
Creatinine, Ser: 1.97 mg/dL — ABNORMAL HIGH (ref 0.40–1.20)
GFR: 24.93 mL/min — ABNORMAL LOW (ref >60.00)
Glucose, Bld: 138 mg/dL — ABNORMAL HIGH (ref 70–99)
Potassium: 4.5 mEq/L (ref 3.5–5.1)
Sodium: 141 mEq/L (ref 135–145)

## 2022-02-25 LAB — CBC WITH DIFFERENTIAL/PLATELET
Basophils Absolute: 0.1 10*3/uL (ref 0.0–0.1)
Basophils Relative: 1.1 % (ref 0.0–3.0)
Eosinophils Absolute: 0.2 10*3/uL (ref 0.0–0.7)
Eosinophils Relative: 2.9 % (ref 0.0–5.0)
HCT: 35.6 % — ABNORMAL LOW (ref 36.0–46.0)
Hemoglobin: 11.1 g/dL — ABNORMAL LOW (ref 12.0–15.0)
Lymphocytes Relative: 28.8 % (ref 12.0–46.0)
Lymphs Abs: 2.5 10*3/uL (ref 0.7–4.0)
MCHC: 31.1 g/dL (ref 30.0–36.0)
MCV: 85.3 fl (ref 78.0–100.0)
Monocytes Absolute: 0.7 10*3/uL (ref 0.1–1.0)
Monocytes Relative: 8.3 % (ref 3.0–12.0)
Neutro Abs: 5.1 10*3/uL (ref 1.4–7.7)
Neutrophils Relative %: 58.9 % (ref 43.0–77.0)
Platelets: 219 10*3/uL (ref 150.0–400.0)
RBC: 4.18 Mil/uL (ref 3.87–5.11)
RDW: 14.7 % (ref 11.5–15.5)
WBC: 8.6 10*3/uL (ref 4.0–10.5)

## 2022-02-25 LAB — T4, FREE: Free T4: 1.17 ng/dL (ref 0.60–1.60)

## 2022-02-25 LAB — TSH: TSH: 3.75 u[IU]/mL (ref 0.35–5.50)

## 2022-02-25 LAB — MICROALBUMIN / CREATININE URINE RATIO
Creatinine,U: 72.5 mg/dL
Microalb Creat Ratio: 1 mg/g (ref 0.0–30.0)
Microalb, Ur: <0.7 mg/dL (ref 0.0–1.9)

## 2022-02-25 MED ORDER — EZETIMIBE 10 MG PO TABS: 10.0000 mg | ORAL_TABLET | Freq: Every day | ORAL | 1 refills | Status: DC

## 2022-02-25 NOTE — Progress Notes (Unsigned)
Subjective:  Patient ID: Sonia Skinner, female    DOB: 09/04/1950  Age: 71 y.o. MRN: [id number]  CC: Hypertension, Diabetes, and Back Pain   HPI Sonia Skinner presents for f/up -  She was recently seen in the ED and treated for dehydration.  She continues to complain of fatigue but denies dizziness, lightheadedness, chest pain, shortness of breath, or edema.  Outpatient Medications Prior to Visit  Medication Sig Dispense Refill   Accu-Chek FastClix Lancets MISC Use to check blood sugar five times daily. 510 each 2   acetaminophen (TYLENOL) 500 MG tablet Take 1,000-1,500 mg by mouth every 6 (six) hours as needed for mild pain.     amitriptyline (ELAVIL) 100 MG tablet Take 1 tablet (100 mg total) by mouth at bedtime. 90 tablet 0   amLODipine (NORVASC) 5 MG tablet Take 1 tablet (5 mg total) by mouth daily. 90 tablet 3   aspirin 81 MG chewable tablet Chew 1 tablet (81 mg total) by mouth daily. 30 tablet 1   atorvastatin (LIPITOR) 80 MG tablet Take 0.5 tablets (40 mg total) by mouth daily. 30 tablet 1   buPROPion (WELLBUTRIN XL) 300 MG 24 hr tablet Take 1 tablet (300 mg total) by mouth daily. 90 tablet 0   Cholecalciferol (VITAMIN D3 PO) Take 1 tablet by mouth daily. Unsure of dose     Continuous Blood Gluc Receiver (DEXCOM G7 RECEIVER) DEVI 1 Act by Does not apply route daily. 2 each 5   Continuous Blood Gluc Sensor (DEXCOM G7 SENSOR) MISC 1 Act by Does not apply route daily. 2 each 5   Continuous Blood Gluc Sensor (DEXCOM G7 SENSOR) MISC 1 Act by Does not apply route daily. 2 each 5   dexlansoprazole (DEXILANT) 60 MG capsule Take 1 capsule (60 mg total) by mouth daily. 90 capsule 0   EDARBYCLOR 40-12.5 MG TABS Take 1 tablet by mouth daily. 90 tablet 0   FARXIGA 10 MG TABS tablet Take 1 tablet by mouth daily before breakfast. 90 tablet 1   gabapentin (NEURONTIN) 100 MG capsule Take 1 capsule (100 mg total) by mouth 3 (three) times daily. 270 capsule 1   HYDROcodone-acetaminophen (NORCO) 5-325  MG tablet Take 1 tablet by mouth every 6 (six) hours as needed for moderate pain. 10 tablet 0   insulin glargine, 1 Unit Dial, (TOUJEO SOLOSTAR) 300 UNIT/ML Solostar Pen Inject 26 Units into the skin daily. 18 mL 3   insulin lispro (HUMALOG KWIKPEN) 200 UNIT/ML KwikPen Inject 5 Units into the skin 3 (three) times daily with meals. 9 mL 1   levothyroxine (EUTHYROX) 88 MCG tablet Take 1 tablet (88 mcg total) by mouth daily. 90 tablet 0   loperamide (IMODIUM) 2 MG capsule Take 1 capsule (2 mg total) by mouth 4 (four) times daily as needed for diarrhea or loose stools. 12 capsule 0   loratadine (CLARITIN) 10 MG tablet Take 1 tablet (10 mg total) by mouth daily. 90 tablet 0   Semaglutide, 2 MG/DOSE, 8 MG/3ML SOPN Inject 2 mg as directed once a week. 9 mL 1   ticagrelor (BRILINTA) 90 MG TABS tablet Take 1 tablet (90 mg total) by mouth 2 (two) times daily. 60 tablet 1   ezetimibe (ZETIA) 10 MG tablet Take 1 tablet (10 mg total) by mouth daily. 30 tablet 1   No facility-administered medications prior to visit.    ROS Review of Systems  Constitutional:  Positive for fatigue. Negative for chills, diaphoresis and fever.  HENT:  Negative.    Eyes: Negative.   Respiratory:  Negative for cough, chest tightness, shortness of breath and wheezing.   Cardiovascular:  Negative for chest pain, palpitations and leg swelling.  Gastrointestinal:  Negative for abdominal pain, constipation, diarrhea, nausea and vomiting.  Endocrine: Negative.   Genitourinary: Negative.  Negative for difficulty urinating.  Musculoskeletal: Negative.  Negative for myalgias.  Skin: Negative.   Neurological: Negative.  Negative for dizziness, weakness and light-headedness.  Hematological:  Negative for adenopathy. Does not bruise/bleed easily.  Psychiatric/Behavioral: Negative.      Objective:  BP (!) 130/58   Pulse (!) 57   Temp 98.6 F (37 C) (Oral)   Ht '5\' 2"'$  (1.575 m)   Wt 131 lb 9.6 oz (59.7 kg)   SpO2 98%   BMI 24.07  kg/m   BP Readings from Last 3 Encounters:  02/25/22 (!) 130/58  02/18/22 99/62  02/17/22 (!) 113/47    Wt Readings from Last 3 Encounters:  02/25/22 131 lb 9.6 oz (59.7 kg)  02/18/22 130 lb (59 kg)  02/17/22 138 lb (62.6 kg)    Physical Exam Vitals reviewed.  HENT:     Nose: Nose normal.     Mouth/Throat:     Mouth: Mucous membranes are moist.  Eyes:     General: No scleral icterus.    Conjunctiva/sclera: Conjunctivae normal.  Cardiovascular:     Rate and Rhythm: Normal rate and regular rhythm.     Heart sounds: No murmur heard. Pulmonary:     Effort: Pulmonary effort is normal.     Breath sounds: No stridor. No wheezing, rhonchi or rales.  Abdominal:     Palpations: There is no mass.     Tenderness: There is no abdominal tenderness. There is no guarding.     Hernia: No hernia is present.  Musculoskeletal:        General: Normal range of motion.     Cervical back: Neck supple.     Right lower leg: No edema.     Left lower leg: No edema.  Lymphadenopathy:     Cervical: No cervical adenopathy.  Skin:    General: Skin is warm and dry.  Neurological:     General: No focal deficit present.  Psychiatric:        Thought Content: Thought content normal.     Lab Results  Component Value Date   WBC 8.6 02/25/2022   HGB 11.1 (L) 02/25/2022   HCT 35.6 (L) 02/25/2022   PLT 219.0 02/25/2022   GLUCOSE 138 (H) 02/25/2022   CHOL 183 11/14/2021   TRIG 198 (H) 11/14/2021   HDL 42 11/14/2021   LDLDIRECT 141 (H) 03/04/2010   LDLCALC 101 (H) 11/14/2021   ALT 14 02/17/2022   AST 13 (L) 02/17/2022   NA 141 02/25/2022   K 4.5 02/25/2022   CL 107 02/25/2022   CREATININE 1.97 (H) 02/25/2022   BUN 27 (H) 02/25/2022   CO2 25 02/25/2022   TSH 3.75 02/25/2022   INR 1.1 11/25/2021   HGBA1C 8.1 (H) 02/26/2022   MICROALBUR <0.7 02/25/2022    CT ABDOMEN PELVIS WO CONTRAST  Result Date: 02/17/2022 CLINICAL DATA:  Nausea and vomiting. Abdominal pain and diarrhea for 3 days.  EXAM: CT ABDOMEN AND PELVIS WITHOUT CONTRAST TECHNIQUE: Multidetector CT imaging of the abdomen and pelvis was performed following the standard protocol without IV contrast. RADIATION DOSE REDUCTION: This exam was performed according to the departmental dose-optimization program which includes automated exposure control, adjustment of  the mA and/or kV according to patient size and/or use of iterative reconstruction technique. COMPARISON:  None Available. FINDINGS: Lower chest: No acute abnormality. Hepatobiliary: No focal liver abnormality. Small stones identified within the gallbladder, image 27/3. No gallbladder distension, wall thickening or inflammation within the limitations of unenhanced technique. No signs of bile duct dilatation. Pancreas: Unremarkable. No pancreatic ductal dilatation or surrounding inflammatory changes. Spleen: Normal in size without focal abnormality. Adrenals/Urinary Tract: Normal adrenal glands. No nephrolithiasis, hydronephrosis or mass identified bilaterally. No signs of hydroureter or ureteral lithiasis. Stomach/Bowel: Evaluation of bowel pathology is limited due to lack of IV and enteric contrast material. The stomach appears unremarkable. The appendix is not confidently identified. No pathologic dilatation of the large or small bowel loops to suggest obstruction. Decompressed colon with mild diffuse wall thickening and intramural fatty deposition is identified. No surrounding inflammatory fat stranding or free fluid. Vascular/Lymphatic: Aortic atherosclerosis. No aneurysm. No signs of abdominopelvic adenopathy. Reproductive: Status post hysterectomy. No adnexal masses. Other: No free fluid or fluid collections. No signs of pneumoperitoneum. Musculoskeletal: Mild curvature of the thoracolumbar spine is convex towards the right. Lumbar degenerative disc disease. No acute or suspicious osseous findings identified. IMPRESSION: 1. Assessment for bowel pathology limited due to lack of IV  and oral contrast. Decompressed colon with mild diffuse wall thickening and intramural fatty deposition. No surrounding inflammatory fat stranding or free fluid. Findings are nonspecific but may reflect colitis. 2. Cholelithiasis. 3. Aortic Atherosclerosis (ICD10-I70.0). Electronically Signed   By: Kerby Moors M.D.   On: 02/17/2022 07:00   CT Head Wo Contrast  Result Date: 02/17/2022 CLINICAL DATA:  Status post fall. EXAM: CT HEAD WITHOUT CONTRAST CT CERVICAL SPINE WITHOUT CONTRAST TECHNIQUE: Multidetector CT imaging of the head and cervical spine was performed following the standard protocol without intravenous contrast. Multiplanar CT image reconstructions of the cervical spine were also generated. RADIATION DOSE REDUCTION: This exam was performed according to the departmental dose-optimization program which includes automated exposure control, adjustment of the mA and/or kV according to patient size and/or use of iterative reconstruction technique. COMPARISON:  11/25/2021 FINDINGS: CT HEAD FINDINGS Brain: No evidence of acute infarction, hemorrhage, hydrocephalus, extra-axial collection or mass lesion/mass effect. Remote left parietal infarct appears unchanged, image 22/4. Vascular: No hyperdense vessel or unexpected calcification. Skull: Normal. Negative for fracture or focal lesion. Sinuses/Orbits: No acute finding. Other: None CT CERVICAL SPINE FINDINGS Alignment: Normal. Skull base and vertebrae: No acute fracture. No primary bone lesion or focal pathologic process. Soft tissues and spinal canal: No prevertebral fluid or swelling. No visible canal hematoma. Disc levels: There is solid fusion of the C6-7 vertebra. Disc space narrowing and endplate spurring identified at C3-4, C5-6 and C7-T1. Upper chest: Negative. Other: Atherosclerotic calcifications identified within the coronary arteries. Stent noted within the left carotid artery. 1.1 cm soft tissue nodule in the left parotid gland is identified,  unchanged compared with 11/03/2013. 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 IMPRESSION: 1. No acute intracranial abnormalities. 2. Remote left parietal infarct. 3. No evidence for cervical spine fracture or subluxation. 4. Advanced cervical degenerative disc disease. Electronically Signed   By: Kerby Moors M.D.   On: 02/17/2022 05:11   CT Cervical Spine Wo Contrast  Result Date: 02/17/2022 CLINICAL DATA:  Status post fall. EXAM: CT HEAD WITHOUT CONTRAST CT CERVICAL SPINE WITHOUT CONTRAST TECHNIQUE: Multidetector CT imaging of the head and cervical spine was performed following the standard protocol without intravenous contrast. Multiplanar CT image reconstructions of  the cervical spine were also generated. RADIATION DOSE REDUCTION: This exam was performed according to the departmental dose-optimization program which includes automated exposure control, adjustment of the mA and/or kV according to patient size and/or use of iterative reconstruction technique. COMPARISON:  11/25/2021 FINDINGS: CT HEAD FINDINGS Brain: No evidence of acute infarction, hemorrhage, hydrocephalus, extra-axial collection or mass lesion/mass effect. Remote left parietal infarct appears unchanged, image 22/4. Vascular: No hyperdense vessel or unexpected calcification. Skull: Normal. Negative for fracture or focal lesion. Sinuses/Orbits: No acute finding. Other: None CT CERVICAL SPINE FINDINGS Alignment: Normal. Skull base and vertebrae: No acute fracture. No primary bone lesion or focal pathologic process. Soft tissues and spinal canal: No prevertebral fluid or swelling. No visible canal hematoma. Disc levels: There is solid fusion of the C6-7 vertebra. Disc space narrowing and endplate spurring identified at C3-4, C5-6 and C7-T1. Upper chest: Negative. Other: Atherosclerotic calcifications identified within the coronary arteries. Stent noted within the left carotid artery. 1.1 cm soft tissue nodule  in the left parotid gland is identified, unchanged compared with 11/03/2013. 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 IMPRESSION: 1. No acute intracranial abnormalities. 2. Remote left parietal infarct. 3. No evidence for cervical spine fracture or subluxation. 4. Advanced cervical degenerative disc disease. Electronically Signed   By: Kerby Moors M.D.   On: 02/17/2022 05:11    Assessment & Plan:   Sonia Skinner was seen today for hypertension, diabetes and back pain.  Diagnoses and all orders for this visit:  Hyperlipidemia with target LDL less than 70- She has not achieved her LDL goal.  Will add Zetia. -     ezetimibe (ZETIA) 10 MG tablet; Take 1 tablet (10 mg total) by mouth daily. -     TSH; Future -     TSH  Stage 3b chronic kidney disease ([place])- Her renal function is stable. -     Basic metabolic panel; Future -     CBC with Differential/Platelet; Future -     Urinalysis, Routine w reflex microscopic; Future -     Microalbumin / creatinine urine ratio; Future -     Microalbumin / creatinine urine ratio -     Urinalysis, Routine w reflex microscopic -     CBC with Differential/Platelet -     Basic metabolic panel  Acquired hypothyroidism- She is euthyroid. -     TSH; Future -     TSH -     T4, free  Type 2 diabetes mellitus with complication, with long-term current use of insulin ([place])- Her blood sugar is adequately well controlled. -     Basic metabolic panel; Future -     Microalbumin / creatinine urine ratio; Future -     Microalbumin / creatinine urine ratio -     Basic metabolic panel -     Hemoglobin A1c; Future -     Hemoglobin A1c   I am having Sonia Skinner. [place] maintain her Accu-Chek FastClix Lancets, loratadine, acetaminophen, Cholecalciferol (VITAMIN D3 PO), amLODipine, Farxiga, aspirin, atorvastatin, ticagrelor, Dexcom G7 Sensor, HumaLOG KwikPen, buPROPion, amitriptyline, Edarbyclor, HYDROcodone-acetaminophen, Dexcom G7 Receiver,  Dexcom G7 Sensor, dexlansoprazole, levothyroxine, Semaglutide (2 MG/DOSE), gabapentin, Toujeo SoloStar, loperamide, and ezetimibe.  Meds ordered this encounter  Medications   ezetimibe (ZETIA) 10 MG tablet    Sig: Take 1 tablet (10 mg total) by mouth daily.    Dispense:  90 tablet    Refill:  1     Follow-up: Return in about 3 months (  around 05/28/2022).  Scarlette Calico, MD

## 2022-02-25 NOTE — Patient Instructions (Signed)
Anemia  Anemia is a condition in which there is not enough red blood cells or hemoglobin in the blood. Hemoglobin is a substance in red blood cells that carries oxygen. When you do not have enough red blood cells or hemoglobin (are anemic), your body cannot get enough oxygen and your organs may not work properly. As a result, you may feel very tired or have other problems. What are the causes? Common causes of anemia include: Excessive bleeding. Anemia can be caused by excessive bleeding inside or outside the body, including bleeding from the intestines or from heavy menstrual periods in females. Poor nutrition. Long-lasting (chronic) kidney, thyroid, and liver disease. Bone marrow disorders, spleen problems, and blood disorders. Cancer and treatments for cancer. HIV (human immunodeficiency virus) and AIDS (acquired immunodeficiency syndrome). Infections, medicines, and autoimmune disorders that destroy red blood cells. What are the signs or symptoms? Symptoms of this condition include: Minor weakness. Dizziness. Headache, or difficulties concentrating and sleeping. Heartbeats that feel irregular or faster than normal (palpitations). Shortness of breath, especially with exercise. Pale skin, lips, and nails, or cold hands and feet. Indigestion and nausea. Symptoms may occur suddenly or develop slowly. If your anemia is mild, you may not have symptoms. How is this diagnosed? This condition is diagnosed based on blood tests, your medical history, and a physical exam. In some cases, a test may be needed in which cells are removed from the soft tissue inside of a bone and looked at under a microscope (bone marrow biopsy). Your health care provider may also check your stool (feces) for blood and may do additional testing to look for the cause of your bleeding. Other tests may include: Imaging tests, such as a CT scan or MRI. A procedure to see inside your esophagus and stomach (endoscopy). A  procedure to see inside your colon and rectum (colonoscopy). How is this treated? Treatment for this condition depends on the cause. If you continue to lose a lot of blood, you may need to be treated at a hospital. Treatment may include: Taking supplements of iron, vitamin B12, or folic acid. Taking a hormone medicine (erythropoietin) that can help to stimulate red blood cell growth. Having a blood transfusion. This may be needed if you lose a lot of blood. Making changes to your diet. Having surgery to remove your spleen. Follow these instructions at home: Take over-the-counter and prescription medicines only as told by your health care provider. Take supplements only as told by your health care provider. Follow any diet instructions that you were given by your health care provider. Keep all follow-up visits as told by your health care provider. This is important. Contact a health care provider if: You develop new bleeding anywhere in the body. Get help right away if: You are very weak. You are short of breath. You have pain in your abdomen or chest. You are dizzy or feel faint. You have trouble concentrating. You have bloody stools, black stools, or tarry stools. You vomit repeatedly or you vomit up blood. These symptoms may represent a serious problem that is an emergency. Do not wait to see if the symptoms will go away. Get medical help right away. Call your local emergency services (911 in the [place] Do not drive yourself to the hospital. Summary Anemia is a condition in which you do not have enough red blood cells or enough of a substance in your red blood cells that carries oxygen (hemoglobin). Symptoms may occur suddenly or develop slowly. If your anemia   is mild, you may not have symptoms. This condition is diagnosed with blood tests, a medical history, and a physical exam. Other tests may be needed. Treatment for this condition depends on the cause of the anemia. This  information is not intended to replace advice given to you by your health care provider. Make sure you discuss any questions you have with your health care provider. Document Revised: 06/16/2021 Document Reviewed: 07/10/2019 Elsevier Patient Education  2023 [employer] Inc[employer]  

## 2022-02-26 LAB — HEMOGLOBIN A1C: Hgb A1c MFr Bld: 8.1 % — ABNORMAL HIGH (ref 4.6–6.5)

## 2022-02-27 MED ORDER — SHINGRIX 50 MCG/0.5ML IM SUSR: 0.5000 mL | Freq: Once | INTRAMUSCULAR | 1 refills | Status: AC

## 2022-03-03 ENCOUNTER — Other Ambulatory Visit: Payer: Self-pay | Admitting: Internal Medicine

## 2022-03-03 DIAGNOSIS — R9431 Abnormal electrocardiogram [ECG] [EKG]: Secondary | ICD-10-CM

## 2022-03-03 DIAGNOSIS — R5382 Chronic fatigue, unspecified: Secondary | ICD-10-CM | POA: Insufficient documentation

## 2022-03-11 ENCOUNTER — Ambulatory Visit (INDEPENDENT_AMBULATORY_CARE_PROVIDER_SITE_OTHER): Payer: Medicare Other | Admitting: Psychologist

## 2022-03-11 DIAGNOSIS — F331 Major depressive disorder, recurrent, moderate: Secondary | ICD-10-CM | POA: Diagnosis not present

## 2022-03-11 NOTE — Progress Notes (Signed)
                Miryah Ralls, PsyD 

## 2022-03-11 NOTE — Progress Notes (Signed)
[facility] Counselor/Therapist Progress Note  Patient ID: Sonia Skinner, MRN: [id number],    Date: 03/11/2022  Time Spent: 09:08 am to 09:46 am; total time: 38 minutes   This session was held via in person. The patient consented to in-person therapy and was in the clinician's office. Limits of confidentiality were discussed with the patient.   Treatment Type: Individual Therapy  Reported Symptoms: Depression  Mental Status Exam: Appearance:  Well Groomed     Behavior: Appropriate  Motor: Normal  Speech/Language:  Clear and Coherent  Affect: Appropriate  Mood: normal  Thought process: normal  Thought content:   WNL  Sensory/Perceptual disturbances:   WNL  Orientation: oriented to person, place, and time/date  Attention: Good  Concentration: Good  Memory: WNL  Fund of knowledge:  Good  Insight:   Fair  Judgment:  Good  Impulse Control: Good   Risk Assessment: Danger to Self:  No Self-injurious Behavior: No Danger to Others: No Duty to Warn:no Physical Aggression / Violence:No  Access to Firearms a concern: No  Gang Involvement:No   Subjective: Beginning the session, patient described herself as okay. After reviewing the treatment plan, patient stated that she wanted to continue developing rapport with the clinician. As part of this, she reflected on conflicting she is experiencing with her husband who she is separated from. She processed the conflict and processed how to respond differently. She was agreeable to the homework and following up. She voiced that she felt like rapport was developing. She denied suicidal and homicidal ideation.    Interventions:  Worked on developing a therapeutic relationship with the patient using active listening and reflective statements. Provided emotional support using empathy and validation. Reviewed the treatment plan with the patient. Reviewed events since the intake. Normalized and validated thoughts. Worked on Public house manager. Identified goals for the session. Explored what clinician could do with building rapport.  Used socratic questions to assist the patient. Explored the etiology of conflict patient was experiencing. Processed thoughts and emotions. Assisted in doing a decisional analysis. Normalized and validated expressed thoughts and emotions. Processed the building of the rapport. Assigned homework. Provided empathic statements. Assessed for suicidal and homicidal ideation.   Homework: Drop off gifts to husband earlier  Next Session: Review homework and emotional support  Diagnosis: F33.1 major depressive affective disorder, recurrent, moderate  Plan:   Goals Alleviate depressive symptoms Recognize, accept, and cope with depressive feelings Develop healthy thinking patterns Develop healthy interpersonal relationships  Objectives target date for all objectives is 02/12/2023 Cooperate with a medication evaluation by a physician Verbalize an accurate understanding of depression Verbalize an understanding of the treatment Identify and replace thoughts that support depression Learn and implement behavioral strategies Verbalize an understanding and resolution of current interpersonal problems Learn and implement problem solving and decision making skills Learn and implement conflict resolution skills to resolve interpersonal problems Verbalize an understanding of healthy and unhealthy emotions verbalize insight into how past relationships may be influence current experiences with depression Use mindfulness and acceptance strategies and increase value based behavior  Increase hopeful statements about the future.  Interventions Consistent with treatment model, discuss how change in cognitive, behavioral, and interpersonal can help client alleviate depression CBT Behavioral activation help the client explore the relationship, nature of the dispute,  Help the client develop new interpersonal skills  and relationships Conduct Problem so living therapy Teach conflict resolution skills Use a process-experiential approach Conduct TLDP Conduct ACT Evaluate need for psychotropic medication Monitor adherence to  medication   The patient and clinician reviewed the treatment plan on 03/11/2022. The patient approved of the treatment plan.   Conception Chancy, PsyD

## 2022-03-15 ENCOUNTER — Encounter (HOSPITAL_COMMUNITY): Payer: Self-pay | Admitting: Psychiatry

## 2022-03-15 ENCOUNTER — Telehealth (HOSPITAL_BASED_OUTPATIENT_CLINIC_OR_DEPARTMENT_OTHER): Payer: Medicare Other | Admitting: Psychiatry

## 2022-03-15 VITALS — Wt 131.0 lb

## 2022-03-15 DIAGNOSIS — F331 Major depressive disorder, recurrent, moderate: Secondary | ICD-10-CM | POA: Diagnosis not present

## 2022-03-15 DIAGNOSIS — F411 Generalized anxiety disorder: Secondary | ICD-10-CM | POA: Diagnosis not present

## 2022-03-15 MED ORDER — FLUOXETINE HCL 10 MG PO CAPS: ORAL_CAPSULE | ORAL | 1 refills | Status: DC

## 2022-03-15 MED ORDER — BUPROPION HCL ER (XL) 300 MG PO TB24: 300.0000 mg | ORAL_TABLET | Freq: Every day | ORAL | 1 refills | Status: DC

## 2022-03-15 NOTE — Progress Notes (Signed)
Virtual Visit via Telephone Note  I connected with Sonia Skinner Skinner on 03/15/22 at  3:00 PM EDT by telephone and verified that I am speaking with the correct person using two identifiers.  Location: Patient: Sonia Skinner Skinner Provider: Home Office   I discussed the limitations, risks, security and privacy concerns of performing an evaluation and management service by telephone and the availability of Sonia Skinner person appointments. I also discussed with the patient that there may be a patient responsible charge related to this service. The patient expressed understanding and agreed to proceed.   History of Present Illness: Patient is evaluated by phone session.  She was Sonia Skinner the emergency room earlier this month because of fall and dehydration.  She admitted feeling fatigued, lack of energy and tired.  She is taking care of her husband who has above-knee amputation.  Patient told husband is not doing well but she had a sister who helps her a lot.  There were no new medication added upon discharge from the emergency room.  She recently had a visit with her primary care physician and her hemoglobin A1c is 8.2 slightly increased from the last blood work.  She started therapy with Conception Chancy and she had 1 appointment and she is going to have another appointment end of this month.  She does not feel her medicine working despite taking Wellbutrin and amitriptyline.  She admitted increased sadness, depression, irritability but denies any suicidal thoughts or homicidal thoughts.  She has weakness on her left side.  She denies any hallucination or any paranoia.  She endorsed feeling anxious and nervous.  She also noticed struggle remembering things and sometime forgetful.  Her appetite is okay.  She gained 3 pounds weight.  She denies drinking or using any illegal substances.   Past Psychiatric History: Reviewed. H/O taking antidepressant on and off most of life. H/O overdose and inpatient at [place] provider at  [facility] and given the Strattera, Adderall, Zoloft, Celexa, Lamictal and Lexapro.  Never tested for ADHD. No h/o psychosis, mania or hallucination.  Sonia Skinner 2001 she moved to [facility] and saw psychiatrist and given amitriptyline and Wellbutrin to stop smoking.    Recent Results (from the past 2160 hour(s))  CBC with Differential     Status: Abnormal   Collection Time: 02/17/22  4:37 AM  Result Value Ref Range   WBC 9.6 4.0 - 10.5 K/uL   RBC 4.19 3.87 - 5.11 MIL/uL   Hemoglobin 11.1 (L) 12.0 - 15.0 g/dL   HCT 36.7 36.0 - 46.0 %   MCV 87.6 80.0 - 100.0 fL   MCH 26.5 26.0 - 34.0 pg   MCHC 30.2 30.0 - 36.0 g/dL   RDW 13.4 11.5 - 15.5 %   Platelets 200 150 - 400 K/uL   nRBC 0.0 0.0 - 0.2 %   Neutrophils Relative % 43 %   Neutro Abs 4.1 1.7 - 7.7 K/uL   Lymphocytes Relative 45 %   Lymphs Abs 4.4 (H) 0.7 - 4.0 K/uL   Monocytes Relative 8 %   Monocytes Absolute 0.8 0.1 - 1.0 K/uL   Eosinophils Relative 3 %   Eosinophils Absolute 0.2 0.0 - 0.5 K/uL   Basophils Relative 1 %   Basophils Absolute 0.1 0.0 - 0.1 K/uL   Immature Granulocytes 0 %   Abs Immature Granulocytes 0.01 0.00 - 0.07 K/uL    Comment: Performed at [facility], [address], [place], [place] [address]  Comprehensive metabolic panel  Status: Abnormal   Collection Time: 02/17/22  4:37 AM  Result Value Ref Range   Sodium 144 135 - 145 mmol/L   Potassium 3.9 3.5 - 5.1 mmol/L   Chloride 107 98 - 111 mmol/L   CO2 22 22 - 32 mmol/L   Glucose, Bld 162 (H) 70 - 99 mg/dL    Comment: Glucose reference range applies only to samples taken after fasting for at least 8 hours.   BUN 29 (H) 8 - 23 mg/dL   Creatinine, Ser 1.98 (H) 0.44 - 1.00 mg/dL   Calcium 9.6 8.9 - 10.3 mg/dL   Total Protein 6.4 (L) 6.5 - 8.1 g/dL   Albumin 3.4 (L) 3.5 - 5.0 g/dL   AST 13 (L) 15 - 41 U/L   ALT 14 0 - 44 U/L   Alkaline Phosphatase 114 38 - 126 U/L   Total Bilirubin 0.3 0.3 - 1.2 mg/dL   GFR, Estimated 26 (L) >60 mL/min     Comment: (NOTE) Calculated using the CKD-EPI Creatinine Equation (2021)    Anion gap 15 5 - 15    Comment: Performed at [facility], [place] [address], [place], [place] [address]  Lipase, blood     Status: Abnormal   Collection Time: 02/17/22  4:37 AM  Result Value Ref Range   Lipase 52 (H) 11 - 51 U/L    Comment: Performed at [facility], [place] [address] [place], [place] [address]  Urinalysis, Routine w reflex microscopic     Status: Abnormal   Collection Time: 02/17/22  6:54 AM  Result Value Ref Range   Color, Urine STRAW (A) YELLOW   APPearance CLEAR CLEAR   Specific Gravity, Urine 1.009 1.005 - 1.030   pH 5.0 5.0 - 8.0   Glucose, UA >=500 (A) NEGATIVE mg/dL   Hgb urine dipstick NEGATIVE NEGATIVE   Bilirubin Urine NEGATIVE NEGATIVE   Ketones, ur NEGATIVE NEGATIVE mg/dL   Protein, ur NEGATIVE NEGATIVE mg/dL   Nitrite NEGATIVE NEGATIVE   Leukocytes,Ua NEGATIVE NEGATIVE   RBC / HPF 0-5 0 - 5 RBC/hpf   WBC, UA 0-5 0 - 5 WBC/hpf   Bacteria, UA NONE SEEN NONE SEEN   Mucus PRESENT     Comment: Performed at [facility] [address] [place], [place] [address]  TSH     Status: None   Collection Time: 02/25/22  1:34 PM  Result Value Ref Range   TSH 3.75 0.35 - 5.50 uIU/mL  Microalbumin / creatinine urine ratio     Status: None   Collection Time: 02/25/22  1:34 PM  Result Value Ref Range   Microalb, Ur <0.7 0.0 - 1.9 mg/dL   Creatinine,U 72.5 mg/dL   Microalb Creat Ratio 1.0 0.0 - 30.0 mg/g  Urinalysis, Routine w reflex microscopic     Status: Abnormal   Collection Time: 02/25/22  1:34 PM  Result Value Ref Range   Color, Urine YELLOW Yellow;Lt. Yellow;Straw;Dark Yellow;Amber;Green;Red;Brown   APPearance CLEAR Clear;Turbid;Slightly Cloudy;Cloudy   Specific Gravity, Urine 1.010 1.000 - 1.030   pH 6.0 5.0 - 8.0   Total Protein, Urine NEGATIVE Negative   Urine Glucose >=1000 (A) Negative   Ketones, ur NEGATIVE Negative   Bilirubin Urine NEGATIVE Negative    Hgb urine dipstick NEGATIVE Negative   Urobilinogen, UA 0.2 0.0 - 1.0   Leukocytes,Ua NEGATIVE Negative   Nitrite NEGATIVE Negative   WBC, UA none seen 0-2/hpf   RBC / HPF none seen 0-2/hpf  Squamous Epithelial / LPF Rare(0-4/hpf) Rare(0-4/hpf)  CBC with Differential/Platelet     Status: Abnormal   Collection Time: 02/25/22  1:34 PM  Result Value Ref Range   WBC 8.6 4.0 - 10.5 K/uL   RBC 4.18 3.87 - 5.11 Mil/uL   Hemoglobin 11.1 (L) 12.0 - 15.0 g/dL   HCT 35.6 (L) 36.0 - 46.0 %   MCV 85.3 78.0 - 100.0 fl   MCHC 31.1 30.0 - 36.0 g/dL   RDW 14.7 11.5 - 15.5 %   Platelets 219.0 150.0 - 400.0 K/uL   Neutrophils Relative % 58.9 43.0 - 77.0 %   Lymphocytes Relative 28.8 12.0 - 46.0 %   Monocytes Relative 8.3 3.0 - 12.0 %   Eosinophils Relative 2.9 0.0 - 5.0 %   Basophils Relative 1.1 0.0 - 3.0 %   Neutro Abs 5.1 1.4 - 7.7 K/uL   Lymphs Abs 2.5 0.7 - 4.0 K/uL   Monocytes Absolute 0.7 0.1 - 1.0 K/uL   Eosinophils Absolute 0.2 0.0 - 0.7 K/uL   Basophils Absolute 0.1 0.0 - 0.1 K/uL  Basic metabolic panel     Status: Abnormal   Collection Time: 02/25/22  1:34 PM  Result Value Ref Range   Sodium 141 135 - 145 mEq/L   Potassium 4.5 3.5 - 5.1 mEq/L   Chloride 107 96 - 112 mEq/L   CO2 25 19 - 32 mEq/L   Glucose, Bld 138 (H) 70 - 99 mg/dL   BUN 27 (H) 6 - 23 mg/dL   Creatinine, Ser 1.97 (H) 0.40 - 1.20 mg/dL   GFR 24.93 (L) >60.00 mL/min    Comment: Calculated using the CKD-EPI Creatinine Equation (2021)   Calcium 10.0 8.4 - 10.5 mg/dL  T4, free     Status: None   Collection Time: 02/25/22  1:34 PM  Result Value Ref Range   Free T4 1.17 0.60 - 1.60 ng/dL    Comment: Specimens from patients who are undergoing biotin therapy and /or ingesting biotin supplements may contain high levels of biotin.  The higher biotin concentration Sonia Skinner these specimens interferes with this Free T4 assay.  Specimens that contain high levels  of biotin may cause false high results for this Free T4 assay.  Please  interpret results Sonia Skinner light of the total clinical presentation of the patient.    Hemoglobin A1c     Status: Abnormal   Collection Time: 02/26/22  8:42 AM  Result Value Ref Range   Hgb A1c MFr Bld 8.1 (H) 4.6 - 6.5 %    Comment: Glycemic Control Guidelines for People with Diabetes:Non Diabetic:  <6%Goal of Therapy: <7%Additional Action Suggested:  >8%      Psychiatric Specialty Exam: Physical Exam  Review of Systems  Neurological:  Positive for numbness.  On left side.   Weight 131 lb (59.4 kg).Body mass index is 23.96 kg/m.  General Appearance: NA  Eye Contact:  NA  Speech:  Slow  Volume:  Decreased  Mood:  Anxious and Dysphoric  Affect:  NA  Thought Process:  Descriptions of Associations: Intact  Orientation:  Full (Time, Place, and Person)  Thought Content:  Rumination  Suicidal Thoughts:  No  Homicidal Thoughts:  No  Memory:  Immediate;   Fair Recent;   Fair Remote;   Fair  Judgement:  Fair  Insight:  Shallow  Psychomotor Activity:  NA  Concentration:  Concentration: Fair and Attention Span: Fair  Recall:  [employer] of Knowledge:  Fair  Language:  Fair  Akathisia:  No  Handed:  Right  AIMS (if indicated):     Assets:  Communication Skills Desire for Improvement Housing Social Support  ADL's:  Intact  Cognition:  Impaired,  Mild  Sleep:   fair     Assessment and Plan: Major depressive disorder, recurrent.  Generalized anxiety disorder.  I reviewed records from recent emergency room visit.  She had a fall.  She started therapy with Conception Chancy and so far she has only 1 visit.  She like to try a different medication.  I review her past medication.  She never tried Prozac.  We will start Prozac 10 mg for 1 week and then 20 mg daily.  We will discontinue amitriptyline.  Continue Wellbutrin XL 300 mg daily.  I encourage keep the therapy appointment to help her coping skills.  Recommended to call us back if she has any question or any concern.  Follow-up Sonia Skinner 6  weeks.  Follow Up Instructions:    I discussed the assessment and treatment plan with the patient. The patient was provided an opportunity to ask questions and all were answered. The patient agreed with the plan and demonstrated an understanding of the instructions.   The patient was advised to call back or seek an Sonia Skinner-person evaluation if the symptoms worsen or if the condition fails to improve as anticipated.  Collaboration of Care: Other provider involved Sonia Skinner patient's care AEB notes are available Sonia Skinner epic to review.  Patient/Guardian was advised Release of Information must be obtained prior to any record release Sonia Skinner order to collaborate their care with an outside provider. Patient/Guardian was advised if they have not already done so to contact the registration department to sign all necessary forms Sonia Skinner order for [place] to release information regarding their care.   Consent: Patient/Guardian gives verbal consent for treatment and assignment of benefits for services provided during this visit. Patient/Guardian expressed understanding and agreed to proceed.    I provided 27 minutes of non-face-to-face time during this encounter.   Kathlee Nations, MD

## 2022-03-16 ENCOUNTER — Telehealth: Payer: Self-pay | Admitting: Internal Medicine

## 2022-03-16 NOTE — Telephone Encounter (Signed)
[employer] called and said that the scan of her heart can no be done until October with the company that the order was sent to.  Can this order be sent to another imaging location.  Please advise.  Patient does not want to wait until October.

## 2022-03-17 ENCOUNTER — Other Ambulatory Visit: Payer: Self-pay | Admitting: Internal Medicine

## 2022-03-17 DIAGNOSIS — R5382 Chronic fatigue, unspecified: Secondary | ICD-10-CM

## 2022-03-18 ENCOUNTER — Other Ambulatory Visit: Payer: Self-pay | Admitting: Internal Medicine

## 2022-03-18 DIAGNOSIS — E119 Type 2 diabetes mellitus without complications: Secondary | ICD-10-CM

## 2022-03-21 ENCOUNTER — Other Ambulatory Visit: Payer: Self-pay | Admitting: Internal Medicine

## 2022-03-21 DIAGNOSIS — I10 Essential (primary) hypertension: Secondary | ICD-10-CM

## 2022-03-21 DIAGNOSIS — N1832 Chronic kidney disease, stage 3b: Secondary | ICD-10-CM

## 2022-03-21 DIAGNOSIS — Z794 Long term (current) use of insulin: Secondary | ICD-10-CM

## 2022-03-29 ENCOUNTER — Ambulatory Visit (INDEPENDENT_AMBULATORY_CARE_PROVIDER_SITE_OTHER): Payer: Medicare Other | Admitting: Internal Medicine

## 2022-03-29 ENCOUNTER — Ambulatory Visit (INDEPENDENT_AMBULATORY_CARE_PROVIDER_SITE_OTHER): Payer: Medicare Other

## 2022-03-29 VITALS — BP 138/74 | HR 85 | Temp 98.6°F | Ht 62.0 in | Wt 131.0 lb

## 2022-03-29 DIAGNOSIS — Z794 Long term (current) use of insulin: Secondary | ICD-10-CM | POA: Diagnosis not present

## 2022-03-29 DIAGNOSIS — D539 Nutritional anemia, unspecified: Secondary | ICD-10-CM

## 2022-03-29 DIAGNOSIS — E559 Vitamin D deficiency, unspecified: Secondary | ICD-10-CM

## 2022-03-29 DIAGNOSIS — M545 Low back pain, unspecified: Secondary | ICD-10-CM

## 2022-03-29 DIAGNOSIS — R531 Weakness: Secondary | ICD-10-CM | POA: Diagnosis not present

## 2022-03-29 DIAGNOSIS — F332 Major depressive disorder, recurrent severe without psychotic features: Secondary | ICD-10-CM | POA: Diagnosis not present

## 2022-03-29 DIAGNOSIS — J90 Pleural effusion, not elsewhere classified: Secondary | ICD-10-CM | POA: Diagnosis not present

## 2022-03-29 DIAGNOSIS — R296 Repeated falls: Secondary | ICD-10-CM | POA: Diagnosis not present

## 2022-03-29 DIAGNOSIS — R0609 Other forms of dyspnea: Secondary | ICD-10-CM

## 2022-03-29 DIAGNOSIS — R0600 Dyspnea, unspecified: Secondary | ICD-10-CM | POA: Diagnosis not present

## 2022-03-29 DIAGNOSIS — E118 Type 2 diabetes mellitus with unspecified complications: Secondary | ICD-10-CM

## 2022-03-29 LAB — HEPATIC FUNCTION PANEL
ALT: 24 U/L (ref 0–35)
AST: 15 U/L (ref 0–37)
Albumin: 4.3 g/dL (ref 3.5–5.2)
Alkaline Phosphatase: 143 U/L — ABNORMAL HIGH (ref 39–117)
Bilirubin, Direct: 0.1 mg/dL (ref 0.0–0.3)
Total Bilirubin: 0.3 mg/dL (ref 0.2–1.2)
Total Protein: 7.1 g/dL (ref 6.0–8.3)

## 2022-03-29 LAB — MICROALBUMIN / CREATININE URINE RATIO
Creatinine,U: 76.7 mg/dL
Microalb Creat Ratio: 0.9 mg/g (ref 0.0–30.0)
Microalb, Ur: <0.7 mg/dL (ref 0.0–1.9)

## 2022-03-29 LAB — IBC PANEL
Iron: 73 ug/dL (ref 42–145)
Saturation Ratios: 16.9 % — ABNORMAL LOW (ref 20.0–50.0)
TIBC: 431.2 ug/dL (ref 250.0–450.0)
Transferrin: 308 mg/dL (ref 212.0–360.0)

## 2022-03-29 LAB — LIPID PANEL
Cholesterol: 184 mg/dL (ref 0–200)
HDL: 45.1 mg/dL (ref >39.00)
LDL Cholesterol: 110 mg/dL — ABNORMAL HIGH (ref 0–99)
NonHDL: 139.11
Total CHOL/HDL Ratio: 4
Triglycerides: 146 mg/dL (ref 0.0–149.0)
VLDL: 29.2 mg/dL (ref 0.0–40.0)

## 2022-03-29 LAB — BASIC METABOLIC PANEL
BUN: 36 mg/dL — ABNORMAL HIGH (ref 6–23)
CO2: 24 mEq/L (ref 19–32)
Calcium: 9.3 mg/dL (ref 8.4–10.5)
Chloride: 101 mEq/L (ref 96–112)
Creatinine, Ser: 2.14 mg/dL — ABNORMAL HIGH (ref 0.40–1.20)
GFR: 22.56 mL/min — ABNORMAL LOW (ref >60.00)
Glucose, Bld: 136 mg/dL — ABNORMAL HIGH (ref 70–99)
Potassium: 4.6 mEq/L (ref 3.5–5.1)
Sodium: 142 mEq/L (ref 135–145)

## 2022-03-29 LAB — CBC WITH DIFFERENTIAL/PLATELET
Basophils Absolute: 0 10*3/uL (ref 0.0–0.1)
Basophils Relative: 0.5 % (ref 0.0–3.0)
Eosinophils Absolute: 0.3 10*3/uL (ref 0.0–0.7)
Eosinophils Relative: 2.5 % (ref 0.0–5.0)
HCT: 35.2 % — ABNORMAL LOW (ref 36.0–46.0)
Hemoglobin: 11.1 g/dL — ABNORMAL LOW (ref 12.0–15.0)
Lymphocytes Relative: 30.6 % (ref 12.0–46.0)
Lymphs Abs: 3.1 10*3/uL (ref 0.7–4.0)
MCHC: 31.4 g/dL (ref 30.0–36.0)
MCV: 84.1 fl (ref 78.0–100.0)
Monocytes Absolute: 0.7 10*3/uL (ref 0.1–1.0)
Monocytes Relative: 7.1 % (ref 3.0–12.0)
Neutro Abs: 5.9 10*3/uL (ref 1.4–7.7)
Neutrophils Relative %: 59.3 % (ref 43.0–77.0)
Platelets: 252 10*3/uL (ref 150.0–400.0)
RBC: 4.19 Mil/uL (ref 3.87–5.11)
RDW: 14.9 % (ref 11.5–15.5)
WBC: 10 10*3/uL (ref 4.0–10.5)

## 2022-03-29 LAB — BRAIN NATRIURETIC PEPTIDE: Pro B Natriuretic peptide (BNP): 5 pg/mL (ref 0.0–100.0)

## 2022-03-29 LAB — URINALYSIS, ROUTINE W REFLEX MICROSCOPIC
Bilirubin Urine: NEGATIVE
Hgb urine dipstick: NEGATIVE
Ketones, ur: NEGATIVE
Leukocytes,Ua: NEGATIVE
Nitrite: NEGATIVE
RBC / HPF: NONE SEEN (ref 0–?)
Specific Gravity, Urine: 1.015 (ref 1.000–1.030)
Total Protein, Urine: NEGATIVE
Urine Glucose: >=1000 — AB
Urobilinogen, UA: 0.2 (ref 0.0–1.0)
pH: 6 (ref 5.0–8.0)

## 2022-03-29 LAB — FERRITIN: Ferritin: 21.3 ng/mL (ref 10.0–291.0)

## 2022-03-29 LAB — TSH: TSH: 4.23 u[IU]/mL (ref 0.35–5.50)

## 2022-03-29 LAB — HEMOGLOBIN A1C: Hgb A1c MFr Bld: 8.9 % — ABNORMAL HIGH (ref 4.6–6.5)

## 2022-03-29 LAB — VITAMIN D 25 HYDROXY (VIT D DEFICIENCY, FRACTURES): VITD: 57.52 ng/mL (ref 30.00–100.00)

## 2022-03-29 LAB — VITAMIN B12: Vitamin B-12: 348 pg/mL (ref 211–911)

## 2022-03-29 MED ORDER — HYDROCODONE-ACETAMINOPHEN 5-325 MG PO TABS: 1.0000 | ORAL_TABLET | Freq: Four times a day (QID) | ORAL | 0 refills | Status: DC | PRN

## 2022-03-29 NOTE — Progress Notes (Signed)
Patient ID: Sonia Skinner, female   DOB: 27-Jul-1951, 71 y.o.   MRN: [id number]        Chief Complaint: follow up fatigue and low stamina, low sugar with falls and depression       HPI:  LONDIN ANTONE is a 71 y.o. female here with c/o fatigue, low stamina, generalized weakness, multiple recent low sugars with dexcom readings in the 90s or less, several falls after recent worsening depression with lower po intake last wk and mild sob.  Pt denies chest pain, wheezing, orthopnea, PND, increased LE swelling, palpitations, dizziness or syncope.   Pt denies polydipsia, polyuria, or new focal neuro s/s.    Pt denies fever, wt loss, night sweats, No recent overt bleeding.  Appetite for some reason is improved this wk, no furher low sugars or falls.         Wt Readings from Last 3 Encounters:  03/29/22 131 lb (59.4 kg)  02/25/22 131 lb 9.6 oz (59.7 kg)  02/18/22 130 lb (59 kg)   BP Readings from Last 3 Encounters:  03/29/22 138/74  02/25/22 (!) 130/58  02/18/22 99/62         Past Medical History:  Diagnosis Date   Ankle fracture    Left   Anxiety    Carotid artery occlusion    Closed fracture of left distal fibula 10/19/5730   Complication of anesthesia    ALLERY TO ESTER BASE   Depression    early 23s   Diabetes mellitus    INSULIN DEPENDENT   GERD (gastroesophageal reflux disease)    Headache    years ago   Hypertension    Pneumonia    Stroke [facility]) March 2015    left MCA infarct, slight weakness on left side   Thyroid disease    Past Surgical History:  Procedure Laterality Date   ABDOMINAL HYSTERECTOMY     ANTERIOR FIXATION AND POSTERIOR MICRODISCECTOMY CERVICAL SPINE  1999   CAROTID ENDARTERECTOMY Left 11-04-13   cea   ENDARTERECTOMY Left 11/04/2013   IR ANGIO INTRA EXTRACRAN SEL COM CAROTID INNOMINATE BILAT MOD SED  11/16/2021   IR ANGIO INTRA EXTRACRAN SEL COM CAROTID INNOMINATE UNI L MOD SED  11/19/2021   IR ANGIO VERTEBRAL SEL VERTEBRAL BILAT MOD SED  11/16/2021   IR CT HEAD LTD   11/19/2021   IR INTRAVSC STENT CERV CAROTID W/EMB-PROT MOD SED INCL ANGIO  11/19/2021   IR RADIOLOGIST EVAL & MGMT  12/13/2021   ORIF ANKLE FRACTURE Left 09/16/2017   Procedure: OPEN REDUCTION INTERNAL FIXATION (ORIF) LEFT ANKLE FRACTURE;  Surgeon: Marchia Bond, MD;  Location: [place];  Service: Orthopedics;  Laterality: Left;   RADIOLOGY WITH ANESTHESIA N/A 11/19/2021   Procedure: Left carotid angioplasty with possible stenting;  Surgeon: Luanne Bras, MD;  Location: [place];  Service: Radiology;  Laterality: N/A;    reports that she quit smoking about 8 years ago. Her smoking use included cigarettes. She has a 20.00 pack-year smoking history. She has never used smokeless tobacco. She reports that she does not currently use alcohol after a past usage of about 1.0 - 2.0 standard drink of alcohol per week. She reports that she does not use drugs. family history includes Cancer in her father; Diabetes in her mother, sister, sister, and sister; Heart disease in her mother; Hypertension in her sister. Allergies  Allergen Reactions   Anesthetics, Ester Anaphylaxis   Current Outpatient Medications on File Prior to Visit  Medication Sig Dispense Refill  Accu-Chek FastClix Lancets MISC Use to check blood sugar five times daily. 510 each 2   acetaminophen (TYLENOL) 500 MG tablet Take 1,000-1,500 mg by mouth every 6 (six) hours as needed for mild pain.     amitriptyline (ELAVIL) 100 MG tablet Take 1 tablet (100 mg total) by mouth at bedtime. 90 tablet 0   amLODipine (NORVASC) 5 MG tablet Take 1 tablet (5 mg total) by mouth daily. 90 tablet 3   aspirin 81 MG chewable tablet Chew 1 tablet (81 mg total) by mouth daily. 30 tablet 1   atorvastatin (LIPITOR) 80 MG tablet Take 0.5 tablets (40 mg total) by mouth daily. 30 tablet 1   buPROPion (WELLBUTRIN XL) 300 MG 24 hr tablet Take 1 tablet (300 mg total) by mouth daily. 30 tablet 1   Cholecalciferol (VITAMIN D3 PO) Take 1 tablet by mouth daily. Unsure of dose      Continuous Blood Gluc Receiver (DEXCOM G7 RECEIVER) DEVI 1 Act by Does not apply route daily. 2 each 5   Continuous Blood Gluc Sensor (DEXCOM G7 SENSOR) MISC 1 Act by Does not apply route daily. 2 each 5   Continuous Blood Gluc Sensor (DEXCOM G7 SENSOR) MISC 1 Act by Does not apply route daily. 2 each 5   dexlansoprazole (DEXILANT) 60 MG capsule Take 1 capsule (60 mg total) by mouth daily. 90 capsule 0   EDARBYCLOR 40-12.5 MG TABS Take 1 tablet by mouth daily. 90 tablet 0   ezetimibe (ZETIA) 10 MG tablet Take 1 tablet (10 mg total) by mouth daily. 90 tablet 1   FARXIGA 10 MG TABS tablet Take 1 tablet by mouth daily before breakfast. 90 tablet 0   FLUoxetine (PROZAC) 10 MG capsule Take one pill for a week and than two pill daily 60 capsule 1   gabapentin (NEURONTIN) 100 MG capsule Take 1 capsule (100 mg total) by mouth 3 (three) times daily. 270 capsule 1   HUMALOG KWIKPEN 200 UNIT/ML KwikPen Inject 5 units under the skin three times daily with meals. 9 mL 0   insulin glargine, 1 Unit Dial, (TOUJEO SOLOSTAR) 300 UNIT/ML Solostar Pen Inject 26 Units into the skin daily. 18 mL 3   levothyroxine (EUTHYROX) 88 MCG tablet Take 1 tablet (88 mcg total) by mouth daily. 90 tablet 0   loperamide (IMODIUM) 2 MG capsule Take 1 capsule (2 mg total) by mouth 4 (four) times daily as needed for diarrhea or loose stools. 12 capsule 0   loratadine (CLARITIN) 10 MG tablet Take 1 tablet (10 mg total) by mouth daily. 90 tablet 0   Semaglutide, 2 MG/DOSE, 8 MG/3ML SOPN Inject 2 mg as directed once a week. 9 mL 1   ticagrelor (BRILINTA) 90 MG TABS tablet Take 1 tablet (90 mg total) by mouth 2 (two) times daily. 60 tablet 1   ACCU-CHEK GUIDE test strip      No current facility-administered medications on file prior to visit.        ROS:  All others reviewed and negative.  Objective        PE:  BP 138/74 (BP Location: Left Arm, Patient Position: Sitting, Cuff Size: Large)   Pulse 85   Temp 98.6 F (37 C) (Oral)    Ht '5\' 2"'$  (1.575 m)   Wt 131 lb (59.4 kg)   SpO2 96%   BMI 23.96 kg/m                 Constitutional: Pt appears in NAD  HENT: Head: NCAT.                Right Ear: External ear normal.                 Left Ear: External ear normal.                Eyes: . Pupils are equal, round, and reactive to light. Conjunctivae and EOM are normal               Nose: without d/c or deformity               Neck: Neck supple. Gross normal ROM               Cardiovascular: Normal rate and regular rhythm.                 Pulmonary/Chest: Effort normal and breath sounds without rales or wheezing.                Abd:  Soft, NT, ND, + BS, no organomegaly               Neurological: Pt is alert. At baseline orientation, motor grossly intact               Skin: Skin is warm. No rashes, no other new lesions, LE edema - none               Psychiatric: Pt behavior is normal without agitation , depressed affect  Micro: none  Cardiac tracings I have personally interpreted today:  none  Pertinent Radiological findings (summarize): none   Lab Results  Component Value Date   WBC 10.0 03/29/2022   HGB 11.1 (L) 03/29/2022   HCT 35.2 (L) 03/29/2022   PLT 252.0 03/29/2022   GLUCOSE 136 (H) 03/29/2022   CHOL 184 03/29/2022   TRIG 146.0 03/29/2022   HDL 45.10 03/29/2022   LDLDIRECT 141 (H) 03/04/2010   LDLCALC 110 (H) 03/29/2022   ALT 24 03/29/2022   AST 15 03/29/2022   NA 142 03/29/2022   K 4.6 03/29/2022   CL 101 03/29/2022   CREATININE 2.14 (H) 03/29/2022   BUN 36 (H) 03/29/2022   CO2 24 03/29/2022   TSH 4.23 03/29/2022   INR 1.1 11/25/2021   HGBA1C 8.9 (H) 03/29/2022   MICROALBUR <0.7 03/29/2022   Assessment/Plan:  KATORI WIRSING is a 71 y.o. Black or African American [2] female with  has a past medical history of Ankle fracture, Anxiety, Carotid artery occlusion, Closed fracture of left distal fibula (02/21/2955), Complication of anesthesia, Depression, Diabetes mellitus, GERD  (gastroesophageal reflux disease), Headache, Hypertension, Pneumonia, Stroke [facility]) (March 2015), and Thyroid disease.  Dyspnea Exam benign, for cxr bnp, d dimer, cbc  General weakness Also for labs as ordered including cmet  Recurrent falls Due to low sugars per pt report now improved this wk, declines PT referral  Type 2 diabetes mellitus with complication, with long-term current use of insulin (HCC) Lab Results  Component Value Date   HGBA1C 8.9 (H) 03/29/2022   uncontrolled, pt to continue current medical treatment farxiga 10 qd, humalog, toujeo 26 units - enocuraged to take all meds as prescribed, declines endo referral for now   Major depressive disorder, recurrent episode ([place]) Chronic with mild worsening recently, encouraged compliance with wellbutrin and prozac, delcines referral for now to counseling or psychiatry  Vitamin D deficiency Last vitamin D Lab Results  Component Value Date   VD25OH 57.52 03/29/2022  Stable, cont oral replacement  Followup: Return if symptoms worsen or fail to improve.  Cathlean Cower, MD 04/02/2022 10:47 PM [facility]

## 2022-03-29 NOTE — Patient Instructions (Signed)
Please continue all other medications as before, and refills have been done if requested.  Please have the pharmacy call with any other refills you may need.  Please continue your efforts at being more active, low cholesterol diet, and weight control.  Please keep your appointments with your specialists as you may have planned  Please go to the XRAY Department in the first floor for the x-ray testing  Please go to the LAB at the blood drawing area for the tests to be done  You will be contacted by phone if any changes need to be made immediately.  Otherwise, you will receive a letter about your results with an explanation, but please check with MyChart first.  Please remember to sign up for MyChart if you have not done so, as this will be important to you in the future with finding out test results, communicating by private email, and scheduling acute appointments online when needed.  

## 2022-03-30 LAB — D-DIMER, QUANTITATIVE: D-Dimer, Quant: 0.5 mcg/mL FEU — ABNORMAL HIGH (ref <0.50)

## 2022-04-02 ENCOUNTER — Encounter: Payer: Self-pay | Admitting: Internal Medicine

## 2022-04-02 NOTE — Assessment & Plan Note (Signed)
Last vitamin D Lab Results  Component Value Date   VD25OH 57.52 03/29/2022   Stable, cont oral replacement

## 2022-04-02 NOTE — Assessment & Plan Note (Signed)
Also for labs as ordered including cmet

## 2022-04-02 NOTE — Assessment & Plan Note (Signed)
Lab Results  Component Value Date   HGBA1C 8.9 (H) 03/29/2022   uncontrolled, pt to continue current medical treatment farxiga 10 qd, humalog, toujeo 26 units - enocuraged to take all meds as prescribed, declines endo referral for now

## 2022-04-02 NOTE — Assessment & Plan Note (Signed)
Exam benign, for cxr bnp, d dimer, cbc

## 2022-04-02 NOTE — Assessment & Plan Note (Signed)
Due to low sugars per pt report now improved this wk, declines PT referral

## 2022-04-02 NOTE — Assessment & Plan Note (Signed)
Chronic with mild worsening recently, encouraged compliance with wellbutrin and prozac, delcines referral for now to counseling or psychiatry

## 2022-04-05 ENCOUNTER — Ambulatory Visit (HOSPITAL_BASED_OUTPATIENT_CLINIC_OR_DEPARTMENT_OTHER)
Admission: RE | Admit: 2022-04-05 | Discharge: 2022-04-05 | Disposition: A | Discharge status: Home / Self Care | Payer: Medicare Other | Source: Ambulatory Visit | Attending: Internal Medicine | Admitting: Internal Medicine

## 2022-04-05 DIAGNOSIS — R5382 Chronic fatigue, unspecified: Secondary | ICD-10-CM | POA: Insufficient documentation

## 2022-04-06 ENCOUNTER — Telehealth: Payer: Self-pay | Admitting: Internal Medicine

## 2022-04-06 ENCOUNTER — Other Ambulatory Visit: Payer: Self-pay | Admitting: Internal Medicine

## 2022-04-06 NOTE — Telephone Encounter (Signed)
Please advise, would you like to refill medication

## 2022-04-07 ENCOUNTER — Telehealth: Payer: Self-pay | Admitting: Physician Assistant

## 2022-04-07 NOTE — Telephone Encounter (Signed)
1. Which medications need refilled? (List name and dosage, if known) Brilinta 75 MG  2. Which pharmacy/location is medication to be sent to? (include street and city if local pharmacy) [place]  3. Do they need a 30 day or 90 day supply? 90  Patient is out of the medication.   Patient is wearing a heart monitor for 30 days. Then, she will call back to schedule a return appointment she said.

## 2022-04-07 NOTE — Telephone Encounter (Signed)
This was on discharge from the hospital. I saw her in the ICU. I have not seen her since. Recommend she contact neurology  doctor for ongoing management of this medication

## 2022-04-07 NOTE — Telephone Encounter (Signed)
Called and spoke with pt letting her know info per ND and she verbalized understanding. Provided pt phone number for neurology office. Nothing further needed.

## 2022-04-07 NOTE — Telephone Encounter (Signed)
Patient advised to contact PCP per Sharene Butters, PA-C.

## 2022-04-08 ENCOUNTER — Ambulatory Visit: Payer: Medicare Other | Admitting: Psychologist

## 2022-04-12 ENCOUNTER — Other Ambulatory Visit (HOSPITAL_COMMUNITY): Payer: Self-pay | Admitting: *Deleted

## 2022-04-12 DIAGNOSIS — F331 Major depressive disorder, recurrent, moderate: Secondary | ICD-10-CM

## 2022-04-12 DIAGNOSIS — F411 Generalized anxiety disorder: Secondary | ICD-10-CM

## 2022-04-12 MED ORDER — FLUOXETINE HCL 10 MG PO CAPS: ORAL_CAPSULE | ORAL | 0 refills | Status: DC

## 2022-04-15 ENCOUNTER — Telehealth: Payer: Self-pay | Admitting: Cardiology

## 2022-04-15 NOTE — Telephone Encounter (Signed)
Pt is requesting call back in regards to heart monitor Dr. Johney Frame requesting she wear before she had her stroke. She is wanting to know if the doctor would still like her to wear heart monitor and if so when should she start it.

## 2022-04-15 NOTE — Telephone Encounter (Signed)
Pt is calling to report that she just had a calcium score done by her PCP and this was abnormal.  Result is visible in epic.   Pt states she saw Dr. Johney Frame back in Jan 2023, and never went through with wearing her 30 day event monitor and following up with our office.  She is asking if she needs to wear this now, being her calcium score was elevated.   She was ordered to wear the event monitor for TIA.   Advised the pt that being it has been so long since she has been seen, we need to get her back in for an appt to be further assessed and appropriate testing ordered.   Scheduled the pt to come into the office and see Ambrose Pancoast NP for next Tuesday 9/5 at 1055.  She was advised to arrive 15 mins prior to that appt.   Dr. Johney Frame aware of this plan as well and agrees with this.   Pt verbalized understanding and agrees with this plan.

## 2022-04-18 NOTE — Progress Notes (Unsigned)
Office Visit    Sonia Skinner Name: Sonia Skinner Date of Encounter: 04/18/2022  Primary Care Provider:  [name], MD Primary Cardiologist:  None Primary Electrophysiologist: None  Chief Complaint    Sonia Skinner is a 71 y.o. female with PMH of CVA s/p 2015 with left-sided weakness, HTN, DM, carotid artery occlusion s/p left CEA 2015 and stent placed 11/2021, CKD stage IIIb thyroid disease, anterior cervical fusion who presents today for abnormal calcium scoring and follow-up of TIA.  Past Medical History    Past Medical History:  Diagnosis Date   Ankle fracture    Left   Anxiety    Carotid artery occlusion    Closed fracture of left distal fibula 04/16/4781   Complication of anesthesia    ALLERY TO ESTER BASE   Depression    early 38s   Diabetes mellitus    INSULIN DEPENDENT   GERD (gastroesophageal reflux disease)    Headache    years ago   Hypertension    Pneumonia    Stroke [facility]) March 2015    left MCA infarct, slight weakness on left side   Thyroid disease    Past Surgical History:  Procedure Laterality Date   ABDOMINAL HYSTERECTOMY     ANTERIOR FIXATION AND POSTERIOR MICRODISCECTOMY CERVICAL SPINE  1999   CAROTID ENDARTERECTOMY Left 11-04-13   cea   ENDARTERECTOMY Left 11/04/2013   IR ANGIO INTRA EXTRACRAN SEL COM CAROTID INNOMINATE BILAT MOD SED  11/16/2021   IR ANGIO INTRA EXTRACRAN SEL COM CAROTID INNOMINATE UNI L MOD SED  11/19/2021   IR ANGIO VERTEBRAL SEL VERTEBRAL BILAT MOD SED  11/16/2021   IR CT HEAD LTD  11/19/2021   IR INTRAVSC STENT CERV CAROTID W/EMB-PROT MOD SED INCL ANGIO  11/19/2021   IR RADIOLOGIST EVAL & MGMT  12/13/2021   ORIF ANKLE FRACTURE Left 09/16/2017   Procedure: OPEN REDUCTION INTERNAL FIXATION (ORIF) LEFT ANKLE FRACTURE;  Surgeon: Marchia Bond, MD;  Location: [place];  Service: Orthopedics;  Laterality: Left;   RADIOLOGY WITH ANESTHESIA N/A 11/19/2021   Procedure: Left carotid angioplasty with possible stenting;  Surgeon: Luanne Bras, MD;   Location: [place];  Service: Radiology;  Laterality: N/A;    Allergies  Allergies  Allergen Reactions   Anesthetics, Ester Anaphylaxis    History of Present Illness    Sonia Skinner is a 71 year old female with the above-mentioned past medical history who presents today for follow-up of abnormal calcium score and TIA.  Sonia Skinner was originally seen by Dr. Johney Skinner in January 2023.  She was being followed up for previous hospitalization of stroke with left-sided numbness and weakness.  MRI was completed that showed previous stroke and CT of the head showed no acute abnormalities.  She had carotid Dopplers completed that showed right ICA consistent with 1-39% stenosis and 60-79% velocities in the left ICA.  She was referred to vascular surgery for further management.  During visit Sonia Skinner's blood pressure was elevated in the 956O systolically.  Sonia Skinner was ordered 30-day event monitor that was never completed.    She presented to the ED at [facility] on 11/13/2021 with complaint of blurry vision, speech difficulty.  Noncontrast CT showed no acute intracranial findings and Sonia Skinner was transferred to [facility] for further stroke work-up.  MRI of the brain completed upon admission that showed no acute infarct.  MRA of the neck and head showed 70% stenosis of proximal cervical left ICA. [facility] evaluated Sonia Skinner on 11/19/21  recommending cerebral angiography with angioplasty and stenting of the left ICA. Sonia Skinner had ICA stent placed 0/08/7492 with no complications.  She was started on Brilinta and continued 81 mg aspirin.  She was seen in follow-up by PCP after experiencing witnessed fall on 02/17/2022.  CT scan was obtained that showed nonspecific findings or changes.  No further work-up was completed.  She underwent calcium scoring on 03/2022 with overall score of 196.  Since last being seen in the office Sonia Skinner reports***.  Sonia Skinner denies chest pain, palpitations, dyspnea, PND, orthopnea, nausea, vomiting,  dizziness, syncope, edema, weight gain, or early satiety.     ***Notes: -Sonia Skinner had recent abnormal calcium score and was seen in 08/2021 due to TIA and had a event monitor ordered but never worn. -The 30-day event monitor -Stress test? Home Medications    Current Outpatient Medications  Medication Sig Dispense Refill   Accu-Chek FastClix Lancets MISC Use to check blood sugar five times daily. 510 each 2   ACCU-CHEK GUIDE test strip      acetaminophen (TYLENOL) 500 MG tablet Take 1,000-1,500 mg by mouth every 6 (six) hours as needed for mild pain.     amitriptyline (ELAVIL) 100 MG tablet Take 1 tablet (100 mg total) by mouth at bedtime. 90 tablet 0   amLODipine (NORVASC) 5 MG tablet Take 1 tablet (5 mg total) by mouth daily. 90 tablet 3   aspirin 81 MG chewable tablet Chew 1 tablet (81 mg total) by mouth daily. 30 tablet 1   atorvastatin (LIPITOR) 80 MG tablet Take 0.5 tablets (40 mg total) by mouth daily. 30 tablet 1   buPROPion (WELLBUTRIN XL) 300 MG 24 hr tablet Take 1 tablet (300 mg total) by mouth daily. 30 tablet 1   Cholecalciferol (VITAMIN D3 PO) Take 1 tablet by mouth daily. Unsure of dose     Continuous Blood Gluc Receiver (DEXCOM G7 RECEIVER) DEVI 1 Act by Does not apply route daily. 2 each 5   Continuous Blood Gluc Sensor (DEXCOM G7 SENSOR) MISC 1 Act by Does not apply route daily. 2 each 5   Continuous Blood Gluc Sensor (DEXCOM G7 SENSOR) MISC 1 Act by Does not apply route daily. 2 each 5   dexlansoprazole (DEXILANT) 60 MG capsule Take 1 capsule (60 mg total) by mouth daily. 90 capsule 0   EDARBYCLOR 40-12.5 MG TABS Take 1 tablet by mouth daily. 90 tablet 0   ezetimibe (ZETIA) 10 MG tablet Take 1 tablet (10 mg total) by mouth daily. 90 tablet 1   FARXIGA 10 MG TABS tablet Take 1 tablet by mouth daily before breakfast. 90 tablet 0   FLUoxetine (PROZAC) 10 MG capsule Take two pills (20 mg total) by mouth daily. 60 capsule 0   gabapentin (NEURONTIN) 100 MG capsule Take 1  capsule (100 mg total) by mouth 3 (three) times daily. 270 capsule 1   HUMALOG KWIKPEN 200 UNIT/ML KwikPen Inject 5 units under the skin three times daily with meals. 9 mL 0   HYDROcodone-acetaminophen (NORCO) 5-325 MG tablet Take 1 tablet by mouth every 6 (six) hours as needed for moderate pain. 20 tablet 0   insulin glargine, 1 Unit Dial, (TOUJEO SOLOSTAR) 300 UNIT/ML Solostar Pen Inject 26 Units into the skin daily. 18 mL 3   levothyroxine (EUTHYROX) 88 MCG tablet Take 1 tablet (88 mcg total) by mouth daily. 90 tablet 0   loperamide (IMODIUM) 2 MG capsule Take 1 capsule (2 mg total) by mouth 4 (four) times daily as needed  for diarrhea or loose stools. 12 capsule 0   loratadine (CLARITIN) 10 MG tablet Take 1 tablet (10 mg total) by mouth daily. 90 tablet 0   Semaglutide, 2 MG/DOSE, 8 MG/3ML SOPN Inject 2 mg as directed once a week. 9 mL 1   ticagrelor (BRILINTA) 90 MG TABS tablet Take 1 tablet (90 mg total) by mouth 2 (two) times daily. 60 tablet 1   No current facility-administered medications for this visit.     Review of Systems  Please see the history of present illness.    (+)*** (+)***  All other systems reviewed and are otherwise negative except as noted above.  Physical Exam    Wt Readings from Last 3 Encounters:  03/29/22 131 lb (59.4 kg)  02/25/22 131 lb 9.6 oz (59.7 kg)  02/18/22 130 lb (59 kg)   ZY:SAYTK were no vitals filed for this visit.,There is no height or weight on file to calculate BMI.  Constitutional:      Appearance: Healthy appearance. Not in distress.  Neck:     Vascular: JVD normal.  Pulmonary:     Effort: Pulmonary effort is normal.     Breath sounds: No wheezing. No rales. Diminished in the bases Cardiovascular:     Normal rate. Regular rhythm. Normal S1. Normal S2.      Murmurs: There is no murmur.  Edema:    Peripheral edema absent.  Abdominal:     Palpations: Abdomen is soft non tender. There is no hepatomegaly.  Skin:    General: Skin is  warm and dry.  Neurological:     General: No focal deficit present.     Mental Status: Alert and oriented to person, place and time.     Cranial Nerves: Cranial nerves are intact.  EKG/LABS/Other Studies Reviewed    ECG personally reviewed by me today - ***  Risk Assessment/Calculations:   {Does this [facility] have ATRIAL FIBRILLATION?:[phone]}        Lab Results  Component Value Date   WBC 10.0 03/29/2022   HGB 11.1 (L) 03/29/2022   HCT 35.2 (L) 03/29/2022   MCV 84.1 03/29/2022   PLT 252.0 03/29/2022   Lab Results  Component Value Date   CREATININE 2.14 (H) 03/29/2022   BUN 36 (H) 03/29/2022   NA 142 03/29/2022   K 4.6 03/29/2022   CL 101 03/29/2022   CO2 24 03/29/2022   Lab Results  Component Value Date   ALT 24 03/29/2022   AST 15 03/29/2022   ALKPHOS 143 (H) 03/29/2022   BILITOT 0.3 03/29/2022   Lab Results  Component Value Date   CHOL 184 03/29/2022   HDL 45.10 03/29/2022   LDLCALC 110 (H) 03/29/2022   LDLDIRECT 141 (H) 03/04/2010   TRIG 146.0 03/29/2022   CHOLHDL 4 03/29/2022    Lab Results  Component Value Date   HGBA1C 8.9 (H) 03/29/2022    Assessment & Plan    1.  History of CVA  2.  Nonobstructive CAD  3.  Carotid artery disease  4.   HTN  5.  Hyperlipidemia      Disposition: Follow-up with None or APP in *** months {Are you ordering a CV Procedure (e.g. stress test, cath, DCCV, TEE, etc)?   Press F2        :[id number]}   Medication Adjustments/Labs and Tests Ordered: Current medicines are reviewed at length with the [facility]inner today.  Concerns regarding medicines are outlined above.   Signed, Mable Fill, Marissa Nestle, NP 04/18/2022,  12:15 PM [facility]

## 2022-04-19 ENCOUNTER — Other Ambulatory Visit: Payer: Self-pay | Admitting: Internal Medicine

## 2022-04-19 DIAGNOSIS — E039 Hypothyroidism, unspecified: Secondary | ICD-10-CM

## 2022-04-20 ENCOUNTER — Encounter: Payer: Self-pay | Admitting: Nurse Practitioner

## 2022-04-20 ENCOUNTER — Encounter: Payer: Self-pay | Admitting: Cardiology

## 2022-04-20 ENCOUNTER — Ambulatory Visit: Payer: Medicare Other | Attending: Nurse Practitioner | Admitting: Nurse Practitioner

## 2022-04-20 VITALS — BP 140/80 | HR 80 | Ht 62.0 in | Wt 129.0 lb

## 2022-04-20 DIAGNOSIS — I2510 Atherosclerotic heart disease of native coronary artery without angina pectoris: Secondary | ICD-10-CM

## 2022-04-20 DIAGNOSIS — G459 Transient cerebral ischemic attack, unspecified: Secondary | ICD-10-CM

## 2022-04-20 DIAGNOSIS — I10 Essential (primary) hypertension: Secondary | ICD-10-CM

## 2022-04-20 DIAGNOSIS — R0789 Other chest pain: Secondary | ICD-10-CM

## 2022-04-20 DIAGNOSIS — I63512 Cerebral infarction due to unspecified occlusion or stenosis of left middle cerebral artery: Secondary | ICD-10-CM | POA: Diagnosis not present

## 2022-04-20 NOTE — Patient Instructions (Signed)
Medication Instructions:  Your physician recommends that you continue on your current medications as directed. Please refer to the Current Medication list given to you today.  *If you need a refill on your cardiac medications before your next appointment, please call your pharmacy*    Testing/Procedures: STRESS TEST Your physician has requested that you have a lexiscan myoview. For further information please visit [email or web address]. Please follow instruction sheet, as given.   Follow-Up: At [facility], you and your health needs are our priority.  As part of our continuing mission to provide you with exceptional heart care, we have created designated Provider Care Teams.  These Care Teams include your primary Cardiologist (physician) and Advanced Practice Providers (APPs -  Physician Assistants and Nurse Practitioners) who all work together to provide you with the care you need, when you need it.    Your next appointment:   2 month(s)  The format for your next appointment:   In Person  Provider:    Ambrose Pancoast, NP

## 2022-04-21 ENCOUNTER — Telehealth (HOSPITAL_COMMUNITY): Payer: Self-pay | Admitting: *Deleted

## 2022-04-21 NOTE — Addendum Note (Signed)
Addended by: Vergia Alcon A on: 04/21/2022 02:33 PM   Modules accepted: Orders

## 2022-04-21 NOTE — Telephone Encounter (Signed)
Patient given detailed instructions per Myocardial Perfusion Study Information Sheet for the test on 04/26/2022 at 10:00. Patient notified to arrive 15 minutes early and that it is imperative to arrive on time for appointment to keep from having the test rescheduled.  If you need to cancel or reschedule your appointment, please call the office within 24 hours of your appointment. . Patient verbalized understanding.Sonia Skinner

## 2022-04-22 NOTE — Addendum Note (Signed)
Addended by: Tempie Donning on: 04/22/2022 07:25 AM   Modules accepted: Orders

## 2022-04-26 ENCOUNTER — Ambulatory Visit (HOSPITAL_COMMUNITY): Payer: Medicare Other | Attending: Internal Medicine

## 2022-04-26 DIAGNOSIS — G459 Transient cerebral ischemic attack, unspecified: Secondary | ICD-10-CM | POA: Diagnosis not present

## 2022-04-26 DIAGNOSIS — I63512 Cerebral infarction due to unspecified occlusion or stenosis of left middle cerebral artery: Secondary | ICD-10-CM | POA: Diagnosis not present

## 2022-04-26 DIAGNOSIS — I2510 Atherosclerotic heart disease of native coronary artery without angina pectoris: Secondary | ICD-10-CM | POA: Diagnosis not present

## 2022-04-26 LAB — MYOCARDIAL PERFUSION IMAGING
LV dias vol: 51 mL (ref 46–106)
LV sys vol: 11 mL
Nuc Stress EF: 79 %
Peak HR: 82 {beats}/min
Rest HR: 75 {beats}/min
Rest Nuclear Isotope Dose: 10.2 mCi
SDS: 2
SRS: 0
SSS: 2
ST Depression (mm): 0 mm
Stress Nuclear Isotope Dose: 33 mCi
TID: 1.01

## 2022-04-26 MED ORDER — TECHNETIUM TC 99M TETROFOSMIN IV KIT
10.2000 | PACK | Freq: Once | INTRAVENOUS | Status: AC | PRN
  Administered 2022-04-26: 10.2 via INTRAVENOUS

## 2022-04-26 MED ORDER — REGADENOSON 0.4 MG/5ML IV SOLN
0.4000 mg | Freq: Once | INTRAVENOUS | Status: AC
  Administered 2022-04-26: 0.4 mg via INTRAVENOUS

## 2022-04-26 MED ORDER — TECHNETIUM TC 99M TETROFOSMIN IV KIT
33.0000 | PACK | Freq: Once | INTRAVENOUS | Status: AC | PRN
  Administered 2022-04-26: 33 via INTRAVENOUS

## 2022-04-27 ENCOUNTER — Telehealth: Payer: Self-pay

## 2022-04-27 ENCOUNTER — Other Ambulatory Visit: Payer: Self-pay | Admitting: Cardiology

## 2022-04-27 ENCOUNTER — Telehealth: Payer: Self-pay | Admitting: Cardiology

## 2022-04-27 DIAGNOSIS — I4891 Unspecified atrial fibrillation: Secondary | ICD-10-CM

## 2022-04-27 DIAGNOSIS — I63512 Cerebral infarction due to unspecified occlusion or stenosis of left middle cerebral artery: Secondary | ICD-10-CM

## 2022-04-27 DIAGNOSIS — I6529 Occlusion and stenosis of unspecified carotid artery: Secondary | ICD-10-CM

## 2022-04-27 DIAGNOSIS — G459 Transient cerebral ischemic attack, unspecified: Secondary | ICD-10-CM

## 2022-04-27 DIAGNOSIS — I639 Cerebral infarction, unspecified: Secondary | ICD-10-CM

## 2022-04-27 DIAGNOSIS — I10 Essential (primary) hypertension: Secondary | ICD-10-CM

## 2022-04-27 MED ORDER — AMLODIPINE BESYLATE 10 MG PO TABS: 10.0000 mg | ORAL_TABLET | Freq: Every day | ORAL | 3 refills | Status: DC

## 2022-04-27 NOTE — Telephone Encounter (Signed)
-----   Message from Marylu Lund., NP sent at 04/27/2022  7:28 AM EDT ----- Please let Sonia Skinner know that her [employer] showed no evidence of ischemia or decreased pumping function.  These results are reassuring that your chest discomfort is not related to any new heart blockages or weakness.   Plan: Please increase amlodipine to 10 mg daily for chest discomfort and better BP control.  Please continue to check your blood pressures with a goal of less than 130/80.  Please let me know if you have any additional questions.  Ambrose Pancoast, NP

## 2022-04-27 NOTE — Telephone Encounter (Signed)
Spoke with Ambrose Pancoast NP who recently saw this pt in clinic.   Per [name], pt should proceed with getting the 30 day event monitor due to history of CVA.    New order for event monitor placed.  Pt does not feel comfortable placing this on by herself.   Pt aware that we will bring her into the office to have her monitor placed by the tech.  Advised her to please bring in her old event monitor to this appt, so that we can return this back to the company accordingly.   Pt aware that Monitor Tech Markus Daft, will be calling her soon to coordinate this appt.  Pt verbalized understanding and agrees with this plan.  Will send a staff message to Markus Daft and [facility] about appt needed.

## 2022-04-27 NOTE — Telephone Encounter (Signed)
Please advise patient to follow-up with neurology for management of Brilinta and Plavix.  They are currently prescribing these medications and would be able to advise on continuation.   Ambrose Pancoast, NP     Called patient with [name] message. Patient stated that she has tried to get refills with Neurology. Looks like pharmacy reached out to Neurologist who saw patient in the hospital, and she prescribed only 30 days. Will send request to address to Sharene Butters, [place] who patient saw at her office visit with neurology.

## 2022-04-27 NOTE — Telephone Encounter (Signed)
The patient has been notified of the result and verbalized understanding.  All questions (if any) were answered. Sonia Barter, RN 04/27/2022 9:19 AM    Patient would like to know if she is suppose to be on plavix or brilinta. Will send back to Ambrose Pancoast NP for advisement.

## 2022-04-27 NOTE — Telephone Encounter (Signed)
Patient calling to find out if she still needs to wear the monitor for 1 month.

## 2022-04-27 NOTE — Telephone Encounter (Signed)
Micky, [employer] - 04/27/2022  4:29 PM Markus Daft A  Sent: Tue April 27, 2022  5:08 PM  To: Nuala Alpha, LPN          Message  Patient scheduled to come in 04/28/22, 3:00 PM.

## 2022-04-28 ENCOUNTER — Ambulatory Visit: Payer: Medicare Other | Attending: Cardiology

## 2022-04-28 DIAGNOSIS — G459 Transient cerebral ischemic attack, unspecified: Secondary | ICD-10-CM

## 2022-04-28 DIAGNOSIS — I4891 Unspecified atrial fibrillation: Secondary | ICD-10-CM

## 2022-04-28 DIAGNOSIS — I639 Cerebral infarction, unspecified: Secondary | ICD-10-CM

## 2022-04-28 DIAGNOSIS — I63512 Cerebral infarction due to unspecified occlusion or stenosis of left middle cerebral artery: Secondary | ICD-10-CM | POA: Diagnosis not present

## 2022-04-30 ENCOUNTER — Telehealth (HOSPITAL_COMMUNITY): Payer: Medicare Other | Admitting: Psychiatry

## 2022-04-30 ENCOUNTER — Ambulatory Visit: Payer: Medicare Other | Admitting: Psychologist

## 2022-05-03 ENCOUNTER — Ambulatory Visit (INDEPENDENT_AMBULATORY_CARE_PROVIDER_SITE_OTHER): Payer: Medicare Other | Admitting: Internal Medicine

## 2022-05-03 ENCOUNTER — Encounter: Payer: Self-pay | Admitting: Internal Medicine

## 2022-05-03 VITALS — BP 120/58 | HR 107 | Ht 62.0 in | Wt 129.7 lb

## 2022-05-03 DIAGNOSIS — E118 Type 2 diabetes mellitus with unspecified complications: Secondary | ICD-10-CM | POA: Diagnosis not present

## 2022-05-03 DIAGNOSIS — Z794 Long term (current) use of insulin: Secondary | ICD-10-CM | POA: Diagnosis not present

## 2022-05-03 DIAGNOSIS — E039 Hypothyroidism, unspecified: Secondary | ICD-10-CM | POA: Diagnosis not present

## 2022-05-03 DIAGNOSIS — E042 Nontoxic multinodular goiter: Secondary | ICD-10-CM

## 2022-05-03 MED ORDER — LEVOTHYROXINE SODIUM 88 MCG PO TABS: 88.0000 ug | ORAL_TABLET | Freq: Every day | ORAL | 3 refills | Status: DC

## 2022-05-03 MED ORDER — SEMAGLUTIDE (2 MG/DOSE) 8 MG/3ML ~~LOC~~ SOPN: 2.0000 mg | PEN_INJECTOR | SUBCUTANEOUS | 3 refills | Status: DC

## 2022-05-03 MED ORDER — TOUJEO SOLOSTAR 300 UNIT/ML ~~LOC~~ SOPN: 20.0000 [IU] | PEN_INJECTOR | Freq: Every day | SUBCUTANEOUS | 3 refills | Status: DC

## 2022-05-03 MED ORDER — DAPAGLIFLOZIN PROPANEDIOL 10 MG PO TABS: 10.0000 mg | ORAL_TABLET | Freq: Every day | ORAL | 3 refills | Status: DC

## 2022-05-03 NOTE — Progress Notes (Signed)
MEN patient ID: Sonia Skinner, female   DOB: 1951/06/25, 71 y.o.   MRN: [id number]  HPI: Sonia Skinner is a 71 y.o.-year-old female, returning for follow-up for DM2, dx in 1962, insulin-dependent since 2000, uncontrolled, with complications (CVA, left carotid stenosis, PAD, CKD stage IV, PN, history of hypoglycemia, history of nonketotic hyperglycemic state). Pt. previously saw Dr. Loanne Drilling, last visit 10/2021.  Reviewed HbA1c: Lab Results  Component Value Date   HGBA1C 8.9 (H) 03/29/2022   HGBA1C 8.1 (H) 02/26/2022   HGBA1C 7.8 (H) 11/14/2021   HGBA1C 8.0 (A) 11/02/2021   HGBA1C 8.5 (H) 08/15/2021   HGBA1C 7.2 (H) 04/07/2021   HGBA1C 10.2 (H) 04/01/2020   HGBA1C 7.3 (A) 06/20/2019   HGBA1C 8.5 (H) 12/06/2018   HGBA1C 9.3 (A) 09/18/2018   Pt is on a regimen of: (prev. Missing doses) - Farxiga 10 mg before breakfast - started 08/2021 - Basaglar >> NPH 35 >> Toujeo 30 >> 26 units at bedtime  - Ozempic 2 mg weekly  Previously also on Humalog 2-5 units 3x a day.  Pt checks her sugars more than 4 times a day with her Dexcom CGM:    Prev.:   Lowest sugar was 40s >> 50s; she has hypoglycemia awareness at 70.  Highest sugar was 300s >> 290.  In 2017, she had nonketotic hyperglycemic state.  Glucometer: Accu-Chek guide  - + CKD stage IV, last BUN/creatinine:  11/26/2021: GFR 19.4 Lab Results  Component Value Date   BUN 36 (H) 03/29/2022   BUN 27 (H) 02/25/2022   CREATININE 2.14 (H) 03/29/2022   CREATININE 1.97 (H) 02/25/2022  Not on ACE inhibitor/ARB.  -+ HL; last set of lipids: Lab Results  Component Value Date   CHOL 184 03/29/2022   HDL 45.10 03/29/2022   LDLCALC 110 (H) 03/29/2022   LDLDIRECT 141 (H) 03/04/2010   TRIG 146.0 03/29/2022   CHOLHDL 4 03/29/2022  On Lipitor 40 mg daily. On Zetia 10 mg daily.  - last eye exam was in 05/2021. No DR.   - no numbness and tingling in her feet.  Foot exam 03/2021.  She is on gabapentin 100 mg 3 times a day.  She has a history of  multinodular goiter, diagnosed in 2017.  Latest thyroid ultrasound (11/09/2021) reviewed: Parenchymal Echotexture: Moderately heterogenous  Isthmus: Normal in size measuring 0.3 cm in diameter, unchanged  Right lobe: Normal in size measuring 5.0 x 1.8 x 1.0 cm, previously, 4.9 x 1.7 x 1.4 cm  Left lobe: Normal in size measuring 4.8 x 1.6 x 1.6 cm, previously, 5.3 x 2.0 x 1.9 cm _________________________________________________________   The approximately 1.2 x 1.0 x 0.9 cm isoechoic ill-defined nodule within the mid aspect the right lobe of the thyroid (labeled 1), is grossly unchanged compared to the 05/2016 examination, previously, 1.2 x 1.1 x 0.9 cm and is again noted to contain an internal partially shadowing macrocalcification. Imaging stability for greater than 5 years is indicative of a benign etiology.   The approximately 1.0 x 1.0 x 0.5 cm spongiform/benign-appearing nodule within the inferior, medial aspect of the right lobe of the thyroid (labeled 2), is grossly unchanged compared to the 05/2016 examination, previously, 1.0 cm, and again does not meet criteria to recommend percutaneous sampling or continued dedicated follow-up.  _________________________________________________________   The approximately 2.5 x 1.5 x 1.3 cm isoechoic nodule within mid aspect the left lobe of the thyroid (labeled 3), is unchanged compared to the 05/2016 examination, previously, 2.5 x 1.8 x  1.3 cm and is again noted to contain an internal partially shadowing macrocalcification. Imaging stability for greater than 5 years is indicative of a benign etiology.   IMPRESSION: 1. Similar findings of multinodular goiter. No worrisome new or enlarging thyroid nodules. 2. All discretely thyroid nodules are unchanged since the 05/2016 examination. By report, dominant bilateral thyroid nodules may have been biopsied at an outside institution. If so, correlation with previous biopsy results is advised.  Otherwise, imaging stability for greater than 5 years is indicative of a benign etiology and as such percutaneous sampling and/or continued dedicated follow-up is not recommended.  She has uncontrolled hypothyroidism:  She is on Synthroid 88 mcg daily: - in am - fasting - right before b'fast >> moved 30 minutes before breakfast - no calcium - no iron - no multivitamins - + PPIs  - Dexilant in am 30 min to 1h >> moved later in the day - not on Biotin  Reviewed most recent TFTs: Lab Results  Component Value Date   TSH 4.23 03/29/2022   TSH 3.75 02/25/2022   TSH 1.088 11/14/2021   TSH 11.69 (H) 11/02/2021   TSH 0.25 (L) 08/27/2021   TSH 0.053 (L) 08/15/2021   TSH 33.15 (H) 04/07/2021   TSH 4.06 04/01/2020   TSH 4.73 (H) 06/20/2019   TSH 27.16 (H) 12/06/2018   She is not on steroids or biotin.  She also has a history of GERD, headache, HTN, depression.  ROS: + see HPI No increased urination, blurry vision, nausea, chest pain.  Past Medical History:  Diagnosis Date   Ankle fracture    Left   Anxiety    Carotid artery occlusion    Closed fracture of left distal fibula 11/15/7060   Complication of anesthesia    ALLERY TO ESTER BASE   Depression    early 73s   Diabetes mellitus    INSULIN DEPENDENT   GERD (gastroesophageal reflux disease)    Headache    years ago   Hypertension    Pneumonia    Stroke [facility]) March 2015    left MCA infarct, slight weakness on left side   Thyroid disease    Past Surgical History:  Procedure Laterality Date   ABDOMINAL HYSTERECTOMY     ANTERIOR FIXATION AND POSTERIOR MICRODISCECTOMY CERVICAL SPINE  1999   CAROTID ENDARTERECTOMY Left 11-04-13   cea   ENDARTERECTOMY Left 11/04/2013   IR ANGIO INTRA EXTRACRAN SEL COM CAROTID INNOMINATE BILAT MOD SED  11/16/2021   IR ANGIO INTRA EXTRACRAN SEL COM CAROTID INNOMINATE UNI L MOD SED  11/19/2021   IR ANGIO VERTEBRAL SEL VERTEBRAL BILAT MOD SED  11/16/2021   IR CT HEAD LTD  11/19/2021   IR  INTRAVSC STENT CERV CAROTID W/EMB-PROT MOD SED INCL ANGIO  11/19/2021   IR RADIOLOGIST EVAL & MGMT  12/13/2021   ORIF ANKLE FRACTURE Left 09/16/2017   Procedure: OPEN REDUCTION INTERNAL FIXATION (ORIF) LEFT ANKLE FRACTURE;  Surgeon: Marchia Bond, MD;  Location: [facility];  Service: Orthopedics;  Laterality: Left;   RADIOLOGY WITH ANESTHESIA N/A 11/19/2021   Procedure: Left carotid angioplasty with possible stenting;  Surgeon: Luanne Bras, MD;  Location: [facility];  Service: Radiology;  Laterality: N/A;   Social History   Socioeconomic History   Marital status: Legally Separated    Spouse name: Not on file   Number of children: 0   Years of education: College   Highest education level: Not on file  Occupational History   Occupation: [employer]  Employer: [employer]  Tobacco Use   Smoking status: Former    Packs/day: 1.00    Years: 20.00    Total pack years: 20.00    Types: Cigarettes    Quit date: 11/01/2013    Years since quitting: 8.5   Smokeless tobacco: Never  Vaping Use   Vaping Use: Never used  Substance and Sexual Activity   Alcohol use: Not Currently    Alcohol/week: 1.0 - 2.0 standard drink of alcohol    Types: 1 - 2 Standard drinks or equivalent per week   Drug use: No   Sexual activity: Not Currently  Other Topics Concern   Not on file  Social History Narrative   Patient lives in 1 story home with sister.   Caffeine Use: 2 cups daily; sodas occasionally   Some college education   Works as Oceanographer for [employer]   Social Determinants of Health   Financial Resource Strain: Low Risk  (01/28/2022)   Overall Financial Resource Strain (CARDIA)    Difficulty of Paying Living Expenses: Not hard at all  Food Insecurity: No Food Insecurity (01/28/2022)   Hunger Vital Sign    Worried About Running Out of Food in the Last Year: Never true    Ran Out of Food in the Last Year: Never true  Transportation Needs: No Transportation Needs (01/28/2022)   PRAPARE - Armed forces logistics/support/administrative officer (Medical): No    Lack of Transportation (Non-Medical): No  Physical Activity: Insufficiently Active (01/28/2022)   Exercise Vital Sign    Days of Exercise per Week: 1 day    Minutes of Exercise per Session: 30 min  Stress: No Stress Concern Present (01/28/2022)   [place]    Feeling of Stress : Not at all  Social Connections: Moderately Integrated (01/28/2022)   Social Connection and Isolation Panel [NHANES]    Frequency of Communication with Friends and Family: More than three times a week    Frequency of Social Gatherings with Friends and Family: More than three times a week    Attends Religious Services: More than 4 times per year    Active Member of [employer] or Organizations: Yes    Attends Archivist Meetings: More than 4 times per year    Marital Status: Separated  Intimate Partner Violence: Not At Risk (01/28/2022)   Humiliation, Afraid, Rape, and Kick questionnaire    Fear of Current or Ex-Partner: No    Emotionally Abused: No    Physically Abused: No    Sexually Abused: No   Current Outpatient Medications on File Prior to Visit  Medication Sig Dispense Refill   Accu-Chek FastClix Lancets MISC Use to check blood sugar five times daily. 510 each 2   ACCU-CHEK GUIDE test strip      acetaminophen (TYLENOL) 500 MG tablet Take 1,000-1,500 mg by mouth every 6 (six) hours as needed for mild pain.     amitriptyline (ELAVIL) 100 MG tablet Take 1 tablet (100 mg total) by mouth at bedtime. 90 tablet 0   amLODipine (NORVASC) 10 MG tablet Take 1 tablet (10 mg total) by mouth daily. 90 tablet 3   aspirin 81 MG chewable tablet Chew 1 tablet (81 mg total) by mouth daily. 30 tablet 1   atorvastatin (LIPITOR) 80 MG tablet Take 0.5 tablets (40 mg total) by mouth daily. 30 tablet 1   buPROPion (WELLBUTRIN XL) 300 MG 24 hr tablet Take 1 tablet (300  mg total) by mouth daily. 30 tablet 1    Cholecalciferol (VITAMIN D3 PO) Take 1 tablet by mouth daily. Unsure of dose     Continuous Blood Gluc Receiver (DEXCOM G7 RECEIVER) DEVI 1 Act by Does not apply route daily. 2 each 5   Continuous Blood Gluc Sensor (DEXCOM G7 SENSOR) MISC 1 Act by Does not apply route daily. 2 each 5   Continuous Blood Gluc Sensor (DEXCOM G7 SENSOR) MISC 1 Act by Does not apply route daily. 2 each 5   dexlansoprazole (DEXILANT) 60 MG capsule Take 1 capsule (60 mg total) by mouth daily. 90 capsule 0   EDARBYCLOR 40-12.5 MG TABS Take 1 tablet by mouth daily. 90 tablet 0   ezetimibe (ZETIA) 10 MG tablet Take 1 tablet (10 mg total) by mouth daily. 90 tablet 1   FARXIGA 10 MG TABS tablet Take 1 tablet by mouth daily before breakfast. 90 tablet 0   FLUoxetine (PROZAC) 10 MG capsule Take two pills (20 mg total) by mouth daily. 60 capsule 0   gabapentin (NEURONTIN) 100 MG capsule Take 1 capsule (100 mg total) by mouth 3 (three) times daily. 270 capsule 1   HUMALOG KWIKPEN 200 UNIT/ML KwikPen Inject 5 units under the skin three times daily with meals. 9 mL 0   HYDROcodone-acetaminophen (NORCO) 5-325 MG tablet Take 1 tablet by mouth every 6 (six) hours as needed for moderate pain. 20 tablet 0   insulin glargine, 1 Unit Dial, (TOUJEO SOLOSTAR) 300 UNIT/ML Solostar Pen Inject 26 Units into the skin daily. 18 mL 3   levothyroxine (SYNTHROID) 88 MCG tablet Take 1 tablet by mouth daily. 90 tablet 0   loperamide (IMODIUM) 2 MG capsule Take 1 capsule (2 mg total) by mouth 4 (four) times daily as needed for diarrhea or loose stools. 12 capsule 0   loratadine (CLARITIN) 10 MG tablet Take 1 tablet (10 mg total) by mouth daily. 90 tablet 0   Semaglutide, 2 MG/DOSE, 8 MG/3ML SOPN Inject 2 mg as directed once a week. 9 mL 1   ticagrelor (BRILINTA) 90 MG TABS tablet Take 1 tablet (90 mg total) by mouth 2 (two) times daily. 60 tablet 1   No current facility-administered medications on file prior to visit.   Allergies  Allergen  Reactions   Anesthetics, Ester Anaphylaxis   Family History  Problem Relation Age of Onset   Diabetes Mother    Heart disease Mother        Before age 49   Cancer Father        Lung   Hypertension Sister    Diabetes Sister    Diabetes Sister    Diabetes Sister     PE: BP (!) 120/58 (BP Location: Left Arm, Patient Position: Sitting, Cuff Size: Normal)   Pulse (!) 107   Ht '5\' 2"'$  (1.575 m)   Wt 129 lb 11.2 oz (58.8 kg)   SpO2 99%   BMI 23.72 kg/m  Wt Readings from Last 3 Encounters:  05/03/22 129 lb 11.2 oz (58.8 kg)  04/26/22 129 lb (58.5 kg)  04/20/22 129 lb (58.5 kg)   Constitutional: normal weight, in NAD Eyes: EOMI, no exophthalmos ENT: no thyromegaly, no cervical lymphadenopathy Cardiovascular: tachycardia, RR, No MRG Respiratory: CTA B Musculoskeletal: no deformities Skin: moist, warm, no rashes Neurological: no tremor with outstretched hands Diabetic Foot Exam - Simple   Simple Foot Form Diabetic Foot exam was performed with the following findings: Yes 05/03/2022  3:52 PM  Visual Inspection  See comments: Yes Sensation Testing Intact to touch and monofilament testing bilaterally: Yes Pulse Check Posterior Tibialis and Dorsalis pulse intact bilaterally: Yes Comments + hallux valgus bilaterally    ASSESSMENT: 1. DM2, insulin-dependent, uncontrolled, with complications - CVA - carotid stenosis - PAD - CKD stage 4 - PN - Hypoglycemia - Nonketotic hyperglycemic state in 2017  2.  Hypothyroidism  3.  Multinodular goiter  PLAN:  1. Patient with longstanding, uncontrolled, type 2 diabetes, on oral antidiabetic regimen with ACE inhibitor, long-acting insulin and GLP-1 receptor agonist, with still poor control.  At last visit, HbA1c was 7.8%, but this increase since last visit, with an 8.9% HbA1c obtained last month. -At last visit, since sugars improved and they were fluctuating mostly within the target range, I did not advise her to add back Humalog but to  decrease the Toujeo dose to avoid hypoglycemia in the morning. -She recently was able to start the Dexcom G7 CGM. CGM interpretation:  -At today's visit, we reviewed her CGM downloads: It appears that 71% of values are in target range (goal >70%), while the 7% are higher than 180 (goal <25%), and 2% are lower than 70 (goal <4%).  The calculated average blood sugar is 152.  The projected HbA1c for the next 3 months (GMI) is 6.9%. -Reviewing the CGM trends, it appears that sugars are still quite fluctuating, mostly in the target range but with increased values after lunch and occasionally after dinner.  Even if her sugars are at goal between meals, sometimes increase quite significantly after meals.  Therefore, at today's visit, I suggested to add back Humalog, 15 minutes before meals and to change the dose based on the size and consistency of her meal.  I did advise her to start with meals with more carbs but I anticipate that she will need it with most if not all meals in the near future.  Since she still describes low blood sugars at night I advised her to decrease Toujeo dose further. - I suggested to:  Patient Instructions  Please continue: - Farxiga 10 mg before breakfast - Ozempic 2 mg weekly  Decrease: -  Toujeo 20-22 units daily  Restart: - Humalog 2-5 units 15 min before a meal with more carbs, but may need to take this before most meals.  Take the thyroid hormone every day, with water, at least 30 minutes before breakfast, separated by at least 4 hours from: - acid reflux medications - calcium - iron - multivitamins  Please return in 4 months.  - advised to check sugars at different times of the day - 4x a day, rotating check times - advised for yearly eye exams >> she is UTD - return to clinic in 4 months  2.  Hypothyroidism - latest thyroid labs reviewed with pt. >> normal: Lab Results  Component Value Date   TSH 4.23 03/29/2022  - she continues on LT4 88 mcg daily - pt  feels good on this dose. - we discussed about taking the thyroid hormone every day, with water, >30 minutes before breakfast, separated by >4 hours from acid reflux medications, calcium, iron, multivitamins. Pt. is taking it correctly now -at last visit she was taking it close to breakfast and Dexilant and we moved these later.  I also advised her to put levothyroxine on the nightstand to take it first thing in the morning.  3.  Multinodular goiter -no neck compression symptoms -Recent thyroid ultrasound showed stability of her nodules over 6 years -No  further investigation is needed for this  Philemon Kingdom, MD PhD [facility]

## 2022-05-03 NOTE — Patient Instructions (Addendum)
Please continue: - Farxiga 10 mg before breakfast - Ozempic 2 mg weekly  Decrease: -  Toujeo 20-22 units daily  Restart: - Humalog 2-5 units 15 min before a meal with more carbs, but may need to take this before most meals.  Take the thyroid hormone every day, with water, at least 30 minutes before breakfast, separated by at least 4 hours from: - acid reflux medications - calcium - iron - multivitamins  Move breakfast >30 min after levothyroxine.  Move Dexilant >4h after levothyroxine.  Please return in 4 months.

## 2022-05-04 NOTE — Progress Notes (Unsigned)
[id number] [employer] event monitor applied to patient.

## 2022-05-07 ENCOUNTER — Ambulatory Visit (INDEPENDENT_AMBULATORY_CARE_PROVIDER_SITE_OTHER): Payer: Medicare Other | Admitting: Psychologist

## 2022-05-07 DIAGNOSIS — F331 Major depressive disorder, recurrent, moderate: Secondary | ICD-10-CM

## 2022-05-07 NOTE — Progress Notes (Signed)
[facility] Counselor/Therapist Progress Note  Patient ID: Sonia Skinner, MRN: [id number],    Date: 05/07/2022  Time Spent: 10:06 am to 10:44 am; total time: 38 minutes   This session was held via in person. The patient consented to in-person therapy and was in the clinician's office. Limits of confidentiality were discussed with the patient.   Treatment Type: Individual Therapy  Reported Symptoms: Stress  Mental Status Exam: Appearance:  Well Groomed     Behavior: Appropriate  Motor: Normal  Speech/Language:  Clear and Coherent  Affect: Appropriate  Mood: normal  Thought process: normal  Thought content:   WNL  Sensory/Perceptual disturbances:   WNL  Orientation: oriented to person, place, and time/date  Attention: Good  Concentration: Good  Memory: WNL  Fund of knowledge:  Good  Insight:   Fair  Judgment:  Good  Impulse Control: Good   Risk Assessment: Danger to Self:  No Self-injurious Behavior: No Danger to Others: No Duty to Warn:no Physical Aggression / Violence:No  Access to Firearms a concern: No  Gang Involvement:No   Subjective: Beginning the session, patient described herself as okay while discussing events since the last session. She indicated that she has multiple stressors that are affecting her and that she wants to reduce her stressors. She identified eating "healthier" and explored what this looked like to her. She was agreeable to the homework and following up. She denied suicidal and homicidal ideation.    Interventions:  Worked on developing a therapeutic relationship with the patient using active listening and reflective statements. Provided emotional support using empathy and validation. Reviewed the treatment plan with the patient. Used summary and reflective statements. Identified goals for the session. Provided psychoeducation about how stress affects the body. Used a Product/process development scientist to assist the patient gain insight into self. Explored an  area that patient can work on reducing stress. Explored the idea of what it means to eat "healthier". Briefly provided psychoeducation. Encouraged patient to get a referral to see a nutrionist. Assigned homework. Provided empathic statements. Assessed for suicidal and homicidal ideation.   Homework: Reach out to medical team about referral for a nutrionist.   Next Session: Review homework and emotional support. Discuss ways to reduce stress  Diagnosis: F33.1 major depressive affective disorder, recurrent, moderate  Plan:   Goals Alleviate depressive symptoms Recognize, accept, and cope with depressive feelings Develop healthy thinking patterns Develop healthy interpersonal relationships  Objectives target date for all objectives is 02/12/2023 Cooperate with a medication evaluation by a physician Verbalize an accurate understanding of depression Verbalize an understanding of the treatment Identify and replace thoughts that support depression Learn and implement behavioral strategies Verbalize an understanding and resolution of current interpersonal problems Learn and implement problem solving and decision making skills Learn and implement conflict resolution skills to resolve interpersonal problems Verbalize an understanding of healthy and unhealthy emotions verbalize insight into how past relationships may be influence current experiences with depression Use mindfulness and acceptance strategies and increase value based behavior  Increase hopeful statements about the future.  Interventions Consistent with treatment model, discuss how change in cognitive, behavioral, and interpersonal can help client alleviate depression CBT Behavioral activation help the client explore the relationship, nature of the dispute,  Help the client develop new interpersonal skills and relationships Conduct Problem so living therapy Teach conflict resolution skills Use a process-experiential  approach Conduct TLDP Conduct ACT Evaluate need for psychotropic medication Monitor adherence to medication   The patient and clinician reviewed the treatment  plan on 03/11/2022. The patient approved of the treatment plan.   Conception Chancy, PsyD

## 2022-05-11 ENCOUNTER — Encounter (HOSPITAL_COMMUNITY): Payer: Self-pay | Admitting: Psychiatry

## 2022-05-11 ENCOUNTER — Telehealth (HOSPITAL_BASED_OUTPATIENT_CLINIC_OR_DEPARTMENT_OTHER): Payer: Medicare Other | Admitting: Psychiatry

## 2022-05-11 VITALS — Wt 129.0 lb

## 2022-05-11 DIAGNOSIS — F331 Major depressive disorder, recurrent, moderate: Secondary | ICD-10-CM

## 2022-05-11 DIAGNOSIS — F411 Generalized anxiety disorder: Secondary | ICD-10-CM

## 2022-05-11 MED ORDER — BUPROPION HCL ER (XL) 300 MG PO TB24: 300.0000 mg | ORAL_TABLET | Freq: Every day | ORAL | 1 refills | Status: DC

## 2022-05-11 MED ORDER — PAROXETINE HCL 20 MG PO TABS: 20.0000 mg | ORAL_TABLET | Freq: Every day | ORAL | 1 refills | Status: DC

## 2022-05-11 NOTE — Progress Notes (Signed)
Virtual Visit via Telephone Note  I connected with Sonia Skinner on 05/11/22 at  3:40 PM EDT by telephone and verified that I am speaking with the correct person using two identifiers.  Location: Patient: In car Provider: Home Office   I discussed the limitations, risks, security and privacy concerns of performing an evaluation and management service by telephone and the availability of in person appointments. I also discussed with the patient that there may be a patient responsible charge related to this service. The patient expressed understanding and agreed to proceed.   History of Present Illness: Patient is evaluated by phone session.  We started her on Prozac because she was complaining of sadness, depression, irritability.  She also complained about tiredness and fatigue.  She has not seen a significant improvement from the Prozac.  Sometimes she has frequent awakening from sleep.  She is taking care of her husband who has above-knee amputation.  Sometime her sister also helps her a lot.  She denies any paranoia or any suicidal thoughts but does still have decreased energy and tiredness.  She has no tremor or shakes or any EPS.  Her blood sugar is high and her last hemoglobin A1c drawn from 8.2-8.9.  She is in therapy with Joan Flores but sometimes not consistent with the therapist.  Her appetite is okay.  Her weight is unchanged from the past.  She has stroke and she has numbness on her left side.   Past Psychiatric History:  H/O taking antidepressant on and off most of life. H/O overdose and inpatient at [place] provider at [facility] and given the Strattera, Adderall, Zoloft, Celexa, Lamictal and Lexapro.  Never tested for ADHD. No h/o psychosis, mania or hallucination.  In 2001 she moved to [place] and saw psychiatrist and given amitriptyline and Wellbutrin to stop smoking.     Psychiatric Specialty Exam: Physical Exam  Review of Systems  Neurological:   Positive for numbness.       On left side    Weight 129 lb (58.5 kg).There is no height or weight on file to calculate BMI.  General Appearance: NA  Eye Contact:  NA  Speech:  Slow  Volume:  Decreased  Mood:   tired  Affect:  NA  Thought Process:  Descriptions of Associations: Intact  Orientation:  Full (Time, Place, and Person)  Thought Content:  Rumination  Suicidal Thoughts:  No  Homicidal Thoughts:  No  Memory:  Immediate;   Fair Recent;   Fair Remote;   Fair  Judgement:  Intact  Insight:  Present  Psychomotor Activity:  NA  Concentration:  Concentration: Fair and Attention Span: Fair  Recall:  [employer] of Knowledge:  Fair  Language:  Fair  Akathisia:  No  Handed:  Right  AIMS (if indicated):     Assets:  Communication Skills Desire for Improvement Housing Social Support  ADL's:  Intact  Cognition:  Impaired,  Mild  Sleep:   fair      Assessment and Plan: Major depressive disorder, recurrent.  Generalized anxiety disorder.  Discussed and reviewed current medication.  She has not seen a significant improvement with Prozac.  In the past she had tried amitriptyline with limited outcome.  I recommend to try Paxil she recall it did help in the past many years ago.  We will start 20 mg but recommend to take half tablet for a few days until full tablet daily.  Continue Wellbutrin XL 300 mg daily.  I encouraged the therapy with Rodman Key.  Recommended to call us back if there is any question or any concern.  Follow-up in 2 months.  Follow Up Instructions:    I discussed the assessment and treatment plan with the patient. The patient was provided an opportunity to ask questions and all were answered. The patient agreed with the plan and demonstrated an understanding of the instructions.   The patient was advised to call back or seek an in-person evaluation if the symptoms worsen or if the condition fails to improve as anticipated.  Collaboration of Care: Primary Care  Provider AEB notes are in epic to review.   Patient/Guardian was advised Release of Information must be obtained prior to any record release in order to collaborate their care with an outside provider. Patient/Guardian was advised if they have not already done so to contact the registration department to sign all necessary forms in order for [place] to release information regarding their care.   Consent: Patient/Guardian gives verbal consent for treatment and assignment of benefits for services provided during this visit. Patient/Guardian expressed understanding and agreed to proceed.    I provided 21 minutes of non-face-to-face time during this encounter.   Kathlee Nations, MD

## 2022-05-12 ENCOUNTER — Other Ambulatory Visit (HOSPITAL_COMMUNITY): Payer: Self-pay | Admitting: *Deleted

## 2022-05-21 ENCOUNTER — Other Ambulatory Visit: Payer: Self-pay | Admitting: Internal Medicine

## 2022-05-28 ENCOUNTER — Ambulatory Visit (INDEPENDENT_AMBULATORY_CARE_PROVIDER_SITE_OTHER): Payer: Medicare Other | Admitting: Psychologist

## 2022-05-28 DIAGNOSIS — F331 Major depressive disorder, recurrent, moderate: Secondary | ICD-10-CM

## 2022-05-28 NOTE — Progress Notes (Signed)
[facility] Counselor/Therapist Progress Note  Patient ID: Sonia Skinner, MRN: [id number],    Date: 05/28/2022  Time Spent: 09:05 am to 09:21 am; total time: 16 minutes   This session was held via in person. The patient consented to in-person therapy and was in the clinician's office. Limits of confidentiality were discussed with the patient.   Treatment Type: Individual Therapy  Reported Symptoms: Denied concerns  Mental Status Exam: Appearance:  Well Groomed     Behavior: Appropriate  Motor: Normal  Speech/Language:  Clear and Coherent  Affect: Appropriate  Mood: normal  Thought process: normal  Thought content:   WNL  Sensory/Perceptual disturbances:   WNL  Orientation: oriented to person, place, and time/date  Attention: Good  Concentration: Good  Memory: WNL  Fund of knowledge:  Good  Insight:   Fair  Judgment:  Good  Impulse Control: Good   Risk Assessment: Danger to Self:  No Self-injurious Behavior: No Danger to Others: No Duty to Warn:no Physical Aggression / Violence:No  Access to Firearms a concern: No  Gang Involvement:No   Subjective: Beginning the session, patient described herself as doing well and denied any immediate concerns. She was primarily curious if the clinician could contact her psychiatrist regarding medication questions. She briefly talked about the upcoming holidays. She voiced that she feels as though she has strategies to assist her currently. She asked to follow up. She denied suicidal and homicidal ideation.    Interventions:  Worked on developing a therapeutic relationship with the patient using active listening and reflective statements. Provided emotional support using empathy and validation. Reviewed the treatment plan with the patient. Reviewed events since the last session. Praised the patient for doing well and explored what has assisted the patient. Apologized to the patient for challenges getting hold of her  psychiatrist. Used socratic questions to assist the patient gain insight. Briefly processed thoughts and emotions related to the upcoming holidays. Discussed next steps for counseling. Provided empathic statements. Assessed for suicidal and homicidal ideation.   Homework: NA  Next Session: Emotional support  Diagnosis: F33.1 major depressive affective disorder, recurrent, moderate  Plan:   Goals Alleviate depressive symptoms Recognize, accept, and cope with depressive feelings Develop healthy thinking patterns Develop healthy interpersonal relationships  Objectives target date for all objectives is 02/12/2023 Cooperate with a medication evaluation by a physician Verbalize an accurate understanding of depression Verbalize an understanding of the treatment Identify and replace thoughts that support depression Learn and implement behavioral strategies Verbalize an understanding and resolution of current interpersonal problems Learn and implement problem solving and decision making skills Learn and implement conflict resolution skills to resolve interpersonal problems Verbalize an understanding of healthy and unhealthy emotions verbalize insight into how past relationships may be influence current experiences with depression Use mindfulness and acceptance strategies and increase value based behavior  Increase hopeful statements about the future.  Interventions Consistent with treatment model, discuss how change in cognitive, behavioral, and interpersonal can help client alleviate depression CBT Behavioral activation help the client explore the relationship, nature of the dispute,  Help the client develop new interpersonal skills and relationships Conduct Problem so living therapy Teach conflict resolution skills Use a process-experiential approach Conduct TLDP Conduct ACT Evaluate need for psychotropic medication Monitor adherence to medication   The patient and clinician reviewed  the treatment plan on 03/11/2022. The patient approved of the treatment plan.   Conception Chancy, PsyD

## 2022-05-31 ENCOUNTER — Ambulatory Visit: Payer: Medicare Other | Admitting: Internal Medicine

## 2022-06-01 ENCOUNTER — Ambulatory Visit (INDEPENDENT_AMBULATORY_CARE_PROVIDER_SITE_OTHER): Payer: Medicare Other | Admitting: Internal Medicine

## 2022-06-01 ENCOUNTER — Encounter: Payer: Self-pay | Admitting: Internal Medicine

## 2022-06-01 VITALS — BP 144/60 | HR 92 | Temp 98.3°F | Ht 62.0 in | Wt 123.0 lb

## 2022-06-01 DIAGNOSIS — N1832 Chronic kidney disease, stage 3b: Secondary | ICD-10-CM | POA: Diagnosis not present

## 2022-06-01 DIAGNOSIS — M5441 Lumbago with sciatica, right side: Secondary | ICD-10-CM | POA: Diagnosis not present

## 2022-06-01 DIAGNOSIS — G8929 Other chronic pain: Secondary | ICD-10-CM

## 2022-06-01 DIAGNOSIS — I10 Essential (primary) hypertension: Secondary | ICD-10-CM | POA: Diagnosis not present

## 2022-06-01 DIAGNOSIS — Z0000 Encounter for general adult medical examination without abnormal findings: Secondary | ICD-10-CM

## 2022-06-01 DIAGNOSIS — Z0001 Encounter for general adult medical examination with abnormal findings: Secondary | ICD-10-CM

## 2022-06-01 DIAGNOSIS — M5442 Lumbago with sciatica, left side: Secondary | ICD-10-CM | POA: Diagnosis not present

## 2022-06-01 LAB — CBC WITH DIFFERENTIAL/PLATELET
Basophils Absolute: 0.1 10*3/uL (ref 0.0–0.1)
Basophils Relative: 0.8 % (ref 0.0–3.0)
Eosinophils Absolute: 0.2 10*3/uL (ref 0.0–0.7)
Eosinophils Relative: 2 % (ref 0.0–5.0)
HCT: 37.9 % (ref 36.0–46.0)
Hemoglobin: 12 g/dL (ref 12.0–15.0)
Lymphocytes Relative: 31.8 % (ref 12.0–46.0)
Lymphs Abs: 2.9 10*3/uL (ref 0.7–4.0)
MCHC: 31.5 g/dL (ref 30.0–36.0)
MCV: 86.6 fl (ref 78.0–100.0)
Monocytes Absolute: 0.7 10*3/uL (ref 0.1–1.0)
Monocytes Relative: 7.6 % (ref 3.0–12.0)
Neutro Abs: 5.3 10*3/uL (ref 1.4–7.7)
Neutrophils Relative %: 57.8 % (ref 43.0–77.0)
Platelets: 269 10*3/uL (ref 150.0–400.0)
RBC: 4.38 Mil/uL (ref 3.87–5.11)
RDW: 16.3 % — ABNORMAL HIGH (ref 11.5–15.5)
WBC: 9.2 10*3/uL (ref 4.0–10.5)

## 2022-06-01 LAB — BASIC METABOLIC PANEL
BUN: 23 mg/dL (ref 6–23)
CO2: 25 mEq/L (ref 19–32)
Calcium: 9.8 mg/dL (ref 8.4–10.5)
Chloride: 104 mEq/L (ref 96–112)
Creatinine, Ser: 1.91 mg/dL — ABNORMAL HIGH (ref 0.40–1.20)
GFR: 25.83 mL/min — ABNORMAL LOW (ref >60.00)
Glucose, Bld: 131 mg/dL — ABNORMAL HIGH (ref 70–99)
Potassium: 4.6 mEq/L (ref 3.5–5.1)
Sodium: 137 mEq/L (ref 135–145)

## 2022-06-01 MED ORDER — HYDROCODONE-ACETAMINOPHEN 5-325 MG PO TABS: 1.0000 | ORAL_TABLET | Freq: Four times a day (QID) | ORAL | 0 refills | Status: DC | PRN

## 2022-06-01 NOTE — Patient Instructions (Signed)

## 2022-06-01 NOTE — Progress Notes (Signed)
Subjective:  Patient ID: Sonia Skinner, female    DOB: November 19, 1950  Age: 71 y.o. MRN: [id number]  CC: Annual Exam, Anemia, Hypertension, Diabetes, and Hyperlipidemia   HPI [name] presents for a CPX and f/up -  She is active and denies DOE, CP, SOB, edema.  Outpatient Medications Prior to Visit  Medication Sig Dispense Refill   Accu-Chek FastClix Lancets MISC Use to check blood sugar five times daily. 510 each 2   ACCU-CHEK GUIDE test strip      acetaminophen (TYLENOL) 500 MG tablet Take 1,000-1,500 mg by mouth every 6 (six) hours as needed for mild pain.     amLODipine (NORVASC) 10 MG tablet Take 1 tablet (10 mg total) by mouth daily. 90 tablet 3   aspirin 81 MG chewable tablet Chew 1 tablet (81 mg total) by mouth daily. 30 tablet 1   atorvastatin (LIPITOR) 80 MG tablet Take 0.5 tablets (40 mg total) by mouth daily. 30 tablet 1   buPROPion (WELLBUTRIN XL) 300 MG 24 hr tablet Take 1 tablet (300 mg total) by mouth daily. 30 tablet 1   Cholecalciferol (VITAMIN D3 PO) Take 1 tablet by mouth daily. Unsure of dose     Continuous Blood Gluc Receiver (DEXCOM G7 RECEIVER) DEVI 1 Act by Does not apply route daily. 2 each 5   Continuous Blood Gluc Sensor (DEXCOM G7 SENSOR) MISC 1 Act by Does not apply route daily. 2 each 5   Continuous Blood Gluc Sensor (DEXCOM G7 SENSOR) MISC 1 Act by Does not apply route daily. 2 each 5   dapagliflozin propanediol (FARXIGA) 10 MG TABS tablet Take 1 tablet (10 mg total) by mouth daily before breakfast. 90 tablet 3   dexlansoprazole (DEXILANT) 60 MG capsule Take 1 capsule by mouth daily. 90 capsule 0   EDARBYCLOR 40-12.5 MG TABS Take 1 tablet by mouth daily. 90 tablet 0   ezetimibe (ZETIA) 10 MG tablet Take 1 tablet (10 mg total) by mouth daily. 90 tablet 1   FLUoxetine (PROZAC) 10 MG capsule Take two pills (20 mg total) by mouth daily. 60 capsule 0   gabapentin (NEURONTIN) 100 MG capsule Take 1 capsule (100 mg total) by mouth 3 (three) times daily. 270 capsule  1   HUMALOG KWIKPEN 200 UNIT/ML KwikPen Inject 5 units under the skin three times daily with meals. 9 mL 0   insulin glargine, 1 Unit Dial, (TOUJEO SOLOSTAR) 300 UNIT/ML Solostar Pen Inject 20-22 Units into the skin daily. 18 mL 3   levothyroxine (SYNTHROID) 88 MCG tablet Take 1 tablet (88 mcg total) by mouth daily. 90 tablet 3   loperamide (IMODIUM) 2 MG capsule Take 1 capsule (2 mg total) by mouth 4 (four) times daily as needed for diarrhea or loose stools. 12 capsule 0   loratadine (CLARITIN) 10 MG tablet Take 1 tablet (10 mg total) by mouth daily. 90 tablet 0   PARoxetine (PAXIL) 20 MG tablet Take 1 tablet (20 mg total) by mouth daily. 30 tablet 1   Semaglutide, 2 MG/DOSE, 8 MG/3ML SOPN Inject 2 mg as directed once a week. 9 mL 3   ticagrelor (BRILINTA) 90 MG TABS tablet Take 1 tablet (90 mg total) by mouth 2 (two) times daily. 60 tablet 1   HYDROcodone-acetaminophen (NORCO) 5-325 MG tablet Take 1 tablet by mouth every 6 (six) hours as needed for moderate pain. 20 tablet 0   No facility-administered medications prior to visit.    ROS Review of Systems  Constitutional: Negative.  Negative for chills, diaphoresis, fatigue and fever.  HENT: Negative.    Respiratory:  Negative for cough, chest tightness, shortness of breath and wheezing.   Cardiovascular:  Negative for chest pain, palpitations and leg swelling.  Gastrointestinal:  Negative for abdominal pain, constipation, diarrhea and vomiting.  Endocrine: Negative.   Genitourinary: Negative.  Negative for difficulty urinating.  Musculoskeletal:  Positive for back pain. Negative for arthralgias, myalgias and neck pain.  Neurological: Negative.  Negative for dizziness, weakness and light-headedness.  Hematological:  Negative for adenopathy. Does not bruise/bleed easily.  Psychiatric/Behavioral: Negative.      Objective:  BP (!) 144/60 (BP Location: Right Arm, Patient Position: Sitting, Cuff Size: Large)   Pulse 92   Temp 98.3 F (36.8  C) (Oral)   Ht '5\' 2"'$  (1.575 m)   Wt 123 lb (55.8 kg)   SpO2 98%   BMI 22.50 kg/m   BP Readings from Last 3 Encounters:  06/01/22 (!) 144/60  05/03/22 (!) 120/58  04/20/22 (!) 140/80    Wt Readings from Last 3 Encounters:  06/01/22 123 lb (55.8 kg)  05/03/22 129 lb 11.2 oz (58.8 kg)  04/26/22 129 lb (58.5 kg)    Physical Exam Vitals reviewed.  HENT:     Nose: Nose normal.     Mouth/Throat:     Mouth: Mucous membranes are moist.  Eyes:     General: No scleral icterus.    Conjunctiva/sclera: Conjunctivae normal.  Cardiovascular:     Rate and Rhythm: Normal rate and regular rhythm.     Heart sounds: No murmur heard. Pulmonary:     Effort: Pulmonary effort is normal.     Breath sounds: No stridor. No wheezing, rhonchi or rales.  Abdominal:     General: Abdomen is flat.     Palpations: There is no mass.     Tenderness: There is no abdominal tenderness. There is no guarding.     Hernia: No hernia is present.  Musculoskeletal:        General: Normal range of motion.     Cervical back: Neck supple.     Right lower leg: No edema.     Left lower leg: No edema.  Lymphadenopathy:     Cervical: No cervical adenopathy.  Skin:    General: Skin is warm and dry.  Neurological:     General: No focal deficit present.     Mental Status: She is alert.  Psychiatric:        Mood and Affect: Mood normal.        Behavior: Behavior normal.     Lab Results  Component Value Date   WBC 9.2 06/01/2022   HGB 12.0 06/01/2022   HCT 37.9 06/01/2022   PLT 269.0 06/01/2022   GLUCOSE 131 (H) 06/01/2022   CHOL 184 03/29/2022   TRIG 146.0 03/29/2022   HDL 45.10 03/29/2022   LDLDIRECT 141 (H) 03/04/2010   LDLCALC 110 (H) 03/29/2022   ALT 24 03/29/2022   AST 15 03/29/2022   NA 137 06/01/2022   K 4.6 06/01/2022   CL 104 06/01/2022   CREATININE 1.91 (H) 06/01/2022   BUN 23 06/01/2022   CO2 25 06/01/2022   TSH 4.23 03/29/2022   INR 1.1 11/25/2021   HGBA1C 8.9 (H) 03/29/2022    MICROALBUR <0.7 03/29/2022    CT CARDIAC SCORING (SELF PAY ONLY)  Addendum Date: 04/06/2022   ADDENDUM REPORT: 04/06/2022 10:14 EXAM: OVER-READ INTERPRETATION  CT CHEST The following report is an over-read performed by  radiologist Dr. Fonnie Birkenhead [facility] on 04/06/2022. This over-read does not include interpretation of cardiac or coronary anatomy or pathology. The interpretation by the cardiologist is attached. COMPARISON:  None. FINDINGS: Atherosclerotic calcification of the aorta. Few pulmonary nodular densities measure 2 mm or less in size. No suspicious pulmonary nodules. No pleural fluid. Degenerative changes in the spine. IMPRESSION: A few pulmonary nodular densities measure 2 mm or less in size. No follow-up needed if patient is low-risk (and has no known or suspected primary neoplasm). Non-contrast chest CT can be considered in 12 months if patient is high-risk. This recommendation follows the consensus statement: Guidelines for Management of Incidental Pulmonary Nodules Detected on CT Images: From the Fleischner Society 2017; Radiology 2017; 284:228-243. Electronically Signed   By: Lorin Picket M.D.   On: 04/06/2022 10:14   Result Date: 04/06/2022 CLINICAL DATA:  Cardiovascular Disease Risk stratification EXAM: Coronary Calcium Score MEDICATIONS: MEDICATIONS None TECHNIQUE: A gated, non-contrast computed tomography scan of the heart was performed using 41m slice thickness. Axial images were analyzed on a dedicated workstation. Calcium scoring of the coronary arteries was performed using the Agatston method. FINDINGS: Coronary arteries: Normal origins. Coronary Calcium Score: Left main: 0 Left anterior descending artery: 74 Left circumflex artery: 68.8 Right coronary artery: 53.3 Total: 196 Percentile: 78th Pericardium: Normal. Ascending Aorta: Normal caliber. [facility]-cardiac: [facility] IMPRESSION: Coronary calcium score of 196 Agatston units.  This was 78th percentile for age-, race-, and sex-matched controls. RECOMMENDATIONS: Coronary artery calcium (CAC) score is a strong predictor of incident coronary heart disease (CHD) and provides predictive information beyond traditional risk factors. CAC scoring is reasonable to use in the decision to withhold, postpone, or initiate statin therapy in intermediate-risk or selected borderline-risk asymptomatic adults (age 73-75years and LDL-C >=70 to <190 mg/dL) who do not have diabetes or established atherosclerotic cardiovascular disease (ASCVD).* In intermediate-risk (10-year ASCVD risk >=7.5% to <20%) adults or selected borderline-risk (10-year ASCVD risk >=5% to <7.5%) adults in whom a CAC score is measured for the purpose of making a treatment decision the following recommendations have been made: If CAC=0, it is reasonable to withhold statin therapy and reassess in 5 to 10 years, as long as higher risk conditions are absent (diabetes mellitus, family history of premature CHD in first degree relatives (males <55 years; females <65 years), cigarette smoking, or LDL >=190 mg/dL). If CAC is 1 to 99, it is reasonable to initiate statin therapy for patients >=544years of age. If CAC is >=100 or >=75th percentile, it is reasonable to initiate statin therapy at any age. Cardiology referral should be considered for patients with CAC scores >=400 or >=75th percentile. *2018 AHA/ACC/AACVPR/AAPA/ABC/ACPM/ADA/AGS/APhA/ASPC/NLA/PCNA Guideline on the Management of Blood Cholesterol: A Report of the American College of Cardiology/American Heart Association Task Force on Clinical Practice Guidelines. J Am Coll Cardiol. 2019;73(24):3168-3209. DLoralie Champagne MD Electronically Signed: By: DLoralie ChampagneM.D. On: 04/05/2022 18:04    Assessment & Plan:   MRocklynwas seen today for annual exam, anemia, hypertension, diabetes and hyperlipidemia.  Diagnoses and all orders for this visit:  Chronic midline low back pain with  bilateral sciatica -     HYDROcodone-acetaminophen (NORCO) 5-325 MG tablet; Take 1 tablet by mouth every 6 (six) hours as needed for moderate pain.  Essential hypertension, benign- Her BP is well controlled. -     Basic metabolic panel; Future -     CBC with Differential/Platelet; Future -     CBC with Differential/Platelet -  Basic metabolic panel  Stage 3b chronic kidney disease ([place])- Her renal function is stable. -     Basic metabolic panel; Future -     CBC with Differential/Platelet; Future -     CBC with Differential/Platelet -     Basic metabolic panel  Encounter for general adult medical examination with abnormal findings- Exam completed, labs reviewed, vaccines and cancer screenings are UTD, pt ed material was given.   I am having Karen Chafe. Lauf maintain her Accu-Chek FastClix Lancets, loratadine, acetaminophen, Cholecalciferol (VITAMIN D3 PO), aspirin, atorvastatin, ticagrelor, Dexcom G7 Sensor, Dexcom G7 Receiver, Dexcom G7 Sensor, gabapentin, loperamide, ezetimibe, HumaLOG KwikPen, Edarbyclor, Accu-Chek Guide, FLUoxetine, amLODipine, levothyroxine, Semaglutide (2 MG/DOSE), dapagliflozin propanediol, Toujeo SoloStar, buPROPion, PARoxetine, dexlansoprazole, and HYDROcodone-acetaminophen.  Meds ordered this encounter  Medications   HYDROcodone-acetaminophen (NORCO) 5-325 MG tablet    Sig: Take 1 tablet by mouth every 6 (six) hours as needed for moderate pain.    Dispense:  100 tablet    Refill:  0     Follow-up: Return in about 3 months (around 09/01/2022).  Scarlette Calico, MD

## 2022-06-06 ENCOUNTER — Encounter: Payer: Self-pay | Admitting: Internal Medicine

## 2022-06-08 ENCOUNTER — Other Ambulatory Visit: Payer: Self-pay | Admitting: Internal Medicine

## 2022-06-08 DIAGNOSIS — N184 Chronic kidney disease, stage 4 (severe): Secondary | ICD-10-CM | POA: Insufficient documentation

## 2022-06-17 ENCOUNTER — Telehealth (HOSPITAL_COMMUNITY): Payer: Self-pay | Admitting: Psychiatry

## 2022-06-18 ENCOUNTER — Other Ambulatory Visit: Payer: Self-pay | Admitting: Internal Medicine

## 2022-06-20 ENCOUNTER — Other Ambulatory Visit: Payer: Self-pay | Admitting: Internal Medicine

## 2022-06-20 DIAGNOSIS — E785 Hyperlipidemia, unspecified: Secondary | ICD-10-CM

## 2022-06-20 DIAGNOSIS — E119 Type 2 diabetes mellitus without complications: Secondary | ICD-10-CM

## 2022-06-20 DIAGNOSIS — I10 Essential (primary) hypertension: Secondary | ICD-10-CM

## 2022-06-20 DIAGNOSIS — Z794 Long term (current) use of insulin: Secondary | ICD-10-CM

## 2022-06-21 ENCOUNTER — Telehealth (HOSPITAL_COMMUNITY): Payer: Self-pay

## 2022-06-21 NOTE — Telephone Encounter (Signed)
  Patient called to report that she has not been able to pick up this medication the pharmacy will not release it she stated that the pharmacy has tried to contact the provider several times to discuss the risk due to patient having thyroid problems. She has continued taking the wellbutrin. Please advise.    Disp Refills Start End   PARoxetine (PAXIL) 20 MG tablet 30 tablet 1 05/11/2022 05/11/2023   Sig - Route: Take 1 tablet (20 mg total) by mouth daily. - Oral   Sent to pharmacy as: PARoxetine (PAXIL) 20 MG tablet   E-Prescribing Status: Receipt confirmed by pharmacy (05/11/2022  4:00 PM EDT)

## 2022-06-21 NOTE — Telephone Encounter (Signed)
I returned patient's phone call.  She is upset because apparently pharmacy tried few times to contact us and they have question about Paxil because patient taking thyroid medication.  I called the [facility] and spoke to person Adria who reported that pharmacy had not called to the doctor's office and they have no question about Paxil.  She told prescription is ready but patient has not came to pick up the medication.  I called again to the patient and explain that she need to pick up the medication from the pharmacy as there is no issue getting her Paxil.  Patient got upset and replied that "you think I am imagination".  I recommend to pick up the medication and if there is an issue then she can call from the pharmacy to [place].  I provided the nurse triage line number [phone].

## 2022-06-21 NOTE — Progress Notes (Unsigned)
Office Visit    Patient Name: Sonia Skinner Date of Encounter: 06/21/2022  Primary Care Provider:  [name], MD Primary Cardiologist:  None Primary Electrophysiologist: None  Chief Complaint    Sonia Skinner is a 71 y.o. female with PMH of CVA s/p 2015 with left-sided weakness, HTN, DM, carotid artery occlusion s/p left CEA 2015 and stent placed 11/2021, CKD stage IIIb thyroid disease, anterior cervical fusion who presents today for abnormal calcium scoring and follow up.   Past Medical History    Past Medical History:  Diagnosis Date   Ankle fracture    Left   Anxiety    Carotid artery occlusion    Closed fracture of left distal fibula 01/18/3328   Complication of anesthesia    ALLERY TO ESTER BASE   Depression    early 82s   Diabetes mellitus    INSULIN DEPENDENT   GERD (gastroesophageal reflux disease)    Headache    years ago   Hypertension    Pneumonia    Stroke [facility]) March 2015    left MCA infarct, slight weakness on left side   Thyroid disease    Past Surgical History:  Procedure Laterality Date   ABDOMINAL HYSTERECTOMY     ANTERIOR FIXATION AND POSTERIOR MICRODISCECTOMY CERVICAL SPINE  1999   CAROTID ENDARTERECTOMY Left 11-04-13   cea   ENDARTERECTOMY Left 11/04/2013   IR ANGIO INTRA EXTRACRAN SEL COM CAROTID INNOMINATE BILAT MOD SED  11/16/2021   IR ANGIO INTRA EXTRACRAN SEL COM CAROTID INNOMINATE UNI L MOD SED  11/19/2021   IR ANGIO VERTEBRAL SEL VERTEBRAL BILAT MOD SED  11/16/2021   IR CT HEAD LTD  11/19/2021   IR INTRAVSC STENT CERV CAROTID W/EMB-PROT MOD SED INCL ANGIO  11/19/2021   IR RADIOLOGIST EVAL & MGMT  12/13/2021   ORIF ANKLE FRACTURE Left 09/16/2017   Procedure: OPEN REDUCTION INTERNAL FIXATION (ORIF) LEFT ANKLE FRACTURE;  Surgeon: Marchia Bond, MD;  Location: [place];  Service: Orthopedics;  Laterality: Left;   RADIOLOGY WITH ANESTHESIA N/A 11/19/2021   Procedure: Left carotid angioplasty with possible stenting;  Surgeon: Luanne Bras, MD;   Location: [place];  Service: Radiology;  Laterality: N/A;    Allergies  Allergies  Allergen Reactions   Anesthetics, Ester Anaphylaxis    History of Present Illness    Sonia Skinner  is a 71 year old female with the above mention past medical history who presents today for follow-up of abnormal calcium score and TIA.  [name] was originally seen by Dr. Johney Frame in January 2023.  She was being followed up for previous hospitalization of stroke with left-sided numbness and weakness.  MRI was completed that showed previous stroke and CT of the head showed no acute abnormalities.  She had carotid Dopplers completed that showed right ICA consistent with 1-39% stenosis and 60-79% velocities in the left ICA.  She was referred to vascular surgery for further management.  During visit patient's blood pressure was elevated in the 518A systolically.  Patient was ordered 30-day event monitor that was never completed.     She presented to the ED at [facility] on 11/13/2021 with complaint of blurry vision, speech difficulty.  Noncontrast CT showed no acute intracranial findings and patient was transferred to [facility] for further stroke work-up.  MRI of the brain completed upon admission that showed no acute infarct.  MRA of the neck and head showed 70% stenosis of proximal cervical left ICA.  VVS evaluated patient on 11/19/21 recommending cerebral angiography with angioplasty and stenting of the left ICA. Patient had ICA stent placed 04/16/4781 with no complications.  She was started on Brilinta and continued 81 mg aspirin.  She was seen in follow-up by PCP after experiencing witnessed fall on 02/17/2022.  CT scan was obtained that showed nonspecific findings or changes.  No further work-up was completed.  She underwent calcium scoring on 03/2022 with overall score of 196.  She was seen in follow-up on 04/20/2022 and reported that she was doing well and tolerating her current medication regimen.  She did endorse some slight  chest discomfort with activity that resolved with resting.  She was recently given a 30-day event monitor however failed to complete task due to inability to place.  She was sent for nuclear stress test due to complaint of chest discomfort that showed no evidence of ischemia and was considered low risk.  I increased her amlodipine to 10 mg to help with chest discomfort.  [name] presents today for 30-monthfollow-up alone.  Since last being seen in the office patient reports she has been doing well from a cardiac perspective.  She denies any chest pain or discomfort at this time.  She is compliant with her current medications and denies any adverse reactions.  During our visit we reviewed her results of event monitor as well as Myoview results.  She had all questions answered to her satisfaction. Patient denies chest pain, palpitations, dyspnea, PND, orthopnea, nausea, vomiting, dizziness, syncope, edema, weight gain, or early satiety.  Home Medications    Current Outpatient Medications  Medication Sig Dispense Refill   Accu-Chek FastClix Lancets MISC Use to check blood sugar five times daily. 510 each 2   ACCU-CHEK GUIDE test strip      acetaminophen (TYLENOL) 500 MG tablet Take 1,000-1,500 mg by mouth every 6 (six) hours as needed for mild pain.     amLODipine (NORVASC) 10 MG tablet Take 1 tablet (10 mg total) by mouth daily. 90 tablet 3   aspirin 81 MG chewable tablet Chew 1 tablet (81 mg total) by mouth daily. 30 tablet 1   atorvastatin (LIPITOR) 80 MG tablet Take 0.5 tablets (40 mg total) by mouth daily. 30 tablet 1   buPROPion (WELLBUTRIN XL) 300 MG 24 hr tablet Take 1 tablet (300 mg total) by mouth daily. 30 tablet 1   Cholecalciferol (VITAMIN D3 PO) Take 1 tablet by mouth daily. Unsure of dose     Continuous Blood Gluc Receiver (DEXCOM G7 RECEIVER) DEVI 1 Act by Does not apply route daily. 2 each 5   Continuous Blood Gluc Sensor (DEXCOM G7 SENSOR) MISC 1 Act by Does not apply route daily. 2  each 5   Continuous Blood Gluc Sensor (DEXCOM G7 SENSOR) MISC 1 Act by Does not apply route daily. 2 each 5   Continuous Blood Gluc Sensor (DEXCOM G7 SENSOR) MISC USE AS DIRECTED TOPICALLY  EVERY  10  DAYS 18 each 1   dapagliflozin propanediol (FARXIGA) 10 MG TABS tablet Take 1 tablet (10 mg total) by mouth daily before breakfast. 90 tablet 3   dexlansoprazole (DEXILANT) 60 MG capsule Take 1 capsule by mouth daily. 90 capsule 0   EDARBYCLOR 40-12.5 MG TABS Take 1 tablet by mouth daily. 90 tablet 0   ezetimibe (ZETIA) 10 MG tablet Take 1 tablet by mouth once daily. 90 tablet 1   FLUoxetine (PROZAC) 10 MG capsule Take two pills (20 mg total) by mouth daily.  60 capsule 0   gabapentin (NEURONTIN) 100 MG capsule Take 1 capsule (100 mg total) by mouth 3 (three) times daily. 270 capsule 1   HUMALOG KWIKPEN 200 UNIT/ML KwikPen Inject 5 units under the skin three times daily with meals. 9 mL 0   HYDROcodone-acetaminophen (NORCO) 5-325 MG tablet Take 1 tablet by mouth every 6 (six) hours as needed for moderate pain. 100 tablet 0   insulin glargine, 1 Unit Dial, (TOUJEO SOLOSTAR) 300 UNIT/ML Solostar Pen Inject 20-22 Units into the skin daily. 18 mL 3   levothyroxine (SYNTHROID) 88 MCG tablet Take 1 tablet (88 mcg total) by mouth daily. 90 tablet 3   loperamide (IMODIUM) 2 MG capsule Take 1 capsule (2 mg total) by mouth 4 (four) times daily as needed for diarrhea or loose stools. 12 capsule 0   loratadine (CLARITIN) 10 MG tablet Take 1 tablet (10 mg total) by mouth daily. 90 tablet 0   PARoxetine (PAXIL) 20 MG tablet Take 1 tablet (20 mg total) by mouth daily. 30 tablet 1   Semaglutide, 2 MG/DOSE, 8 MG/3ML SOPN Inject 2 mg as directed once a week. 9 mL 3   ticagrelor (BRILINTA) 90 MG TABS tablet Take 1 tablet (90 mg total) by mouth 2 (two) times daily. 60 tablet 1   No current facility-administered medications for this visit.     Review of Systems  Please see the history of present illness.   All other  systems reviewed and are otherwise negative except as noted above.  Physical Exam    Wt Readings from Last 3 Encounters:  06/01/22 123 lb (55.8 kg)  05/03/22 129 lb 11.2 oz (58.8 kg)  04/26/22 129 lb (58.5 kg)   VZ:CHYIF were no vitals filed for this visit.,There is no height or weight on file to calculate BMI.  Constitutional:      Appearance: Healthy appearance. Not in distress.  Neck:     Vascular: JVD normal.  Pulmonary:     Effort: Pulmonary effort is normal.     Breath sounds: No wheezing. No rales. Diminished in the bases Cardiovascular:     Normal rate. Regular rhythm. Normal S1. Normal S2.      Murmurs: There is no murmur.  Edema:    Peripheral edema absent.  Abdominal:     Palpations: Abdomen is soft non tender. There is no hepatomegaly.  Skin:    General: Skin is warm and dry.  Neurological:     General: No focal deficit present.     Mental Status: Alert and oriented to person, place and time.     Cranial Nerves: Cranial nerves are intact.  EKG/LABS/Other Studies Reviewed    ECG personally reviewed by me today -none completed today         Lab Results  Component Value Date   WBC 9.2 06/01/2022   HGB 12.0 06/01/2022   HCT 37.9 06/01/2022   MCV 86.6 06/01/2022   PLT 269.0 06/01/2022   Lab Results  Component Value Date   CREATININE 1.91 (H) 06/01/2022   BUN 23 06/01/2022   NA 137 06/01/2022   K 4.6 06/01/2022   CL 104 06/01/2022   CO2 25 06/01/2022   Lab Results  Component Value Date   ALT 24 03/29/2022   AST 15 03/29/2022   ALKPHOS 143 (H) 03/29/2022   BILITOT 0.3 03/29/2022   Lab Results  Component Value Date   CHOL 184 03/29/2022   HDL 45.10 03/29/2022   LDLCALC 110 (H) 03/29/2022  LDLDIRECT 141 (H) 03/04/2010   TRIG 146.0 03/29/2022   CHOLHDL 4 03/29/2022    Lab Results  Component Value Date   HGBA1C 8.9 (H) 03/29/2022    Assessment & Plan    1.  History of CVA: -CVA due to stenosis of left middle cerebral artery with  residual left-sided weakness. -She wore a 30-day event monitor that showed no evidence of atrial fibrillation or tachyarrhythmia. -Continue GDMT with atorvastatin 80 mg, ASA 81 mg, Zetia 10 mg   2.  Atypical chest pain -Coronary calcium score of 196 completed 03/2022 -Patient reported slight chest discomfort and underwent Lexiscan Myoview that revealed no ischemia and low risk study -Today patient reports that her chest discomfort has resolved.  3.  Carotid artery disease: -MRA of the neck and head showed 70% stenosis of proximal cervical left ICA. -Carotid artery occlusion s/p CEA in 2015 -He is advised to continue ASA 81 mg and atorvastatin 80 mg and Zetia 10 mg as noted above. -   4.   HTN: -Blood pressure today was was well controlled at 130/60  -Continue amlodipine 10 mg daily  5.  Hyperlipidemia: -Last LDL cholesterol was 110 above goal of less than 70 -Continue current regimen of atorvastatin and Zetia  Disposition: Follow-up with None or APP in 6 months   Medication Adjustments/Labs and Tests Ordered: Current medicines are reviewed at length with the patient today.  Concerns regarding medicines are outlined above.   Signed, Mable Fill, Marissa Nestle, NP 06/21/2022, 10:30 AM [facility]  Note:  This document was prepared using Dragon voice recognition software and may include unintentional dictation errors.

## 2022-06-23 ENCOUNTER — Encounter: Payer: Self-pay | Admitting: Nurse Practitioner

## 2022-06-23 ENCOUNTER — Ambulatory Visit: Payer: Medicare Other | Attending: Nurse Practitioner | Admitting: Nurse Practitioner

## 2022-06-23 VITALS — BP 130/60 | HR 96 | Ht 62.0 in | Wt 123.0 lb

## 2022-06-23 DIAGNOSIS — I63512 Cerebral infarction due to unspecified occlusion or stenosis of left middle cerebral artery: Secondary | ICD-10-CM | POA: Diagnosis not present

## 2022-06-23 DIAGNOSIS — I10 Essential (primary) hypertension: Secondary | ICD-10-CM

## 2022-06-23 DIAGNOSIS — R0789 Other chest pain: Secondary | ICD-10-CM | POA: Diagnosis not present

## 2022-06-23 DIAGNOSIS — E785 Hyperlipidemia, unspecified: Secondary | ICD-10-CM

## 2022-06-23 NOTE — Patient Instructions (Signed)
Medication Instructions:  Your physician recommends that you continue on your current medications as directed. Please refer to the Current Medication list given to you today. *If you need a refill on your cardiac medications before your next appointment, please call your pharmacy*   Lab Work: None ordered   Testing/Procedures: None ordered   Follow-Up: At [facility], you and your health needs are our priority.  As part of our continuing mission to provide you with exceptional heart care, we have created designated Provider Care Teams.  These Care Teams include your primary Cardiologist (physician) and Advanced Practice Providers (APPs -  Physician Assistants and Nurse Practitioners) who all work together to provide you with the care you need, when you need it.  We recommend signing up for the patient portal called "MyChart".  Sign up information is provided on this After Visit Summary.  MyChart is used to connect with patients for Virtual Visits (Telemedicine).  Patients are able to view lab/test results, encounter notes, upcoming appointments, etc.  Non-urgent messages can be sent to your provider as well.   To learn more about what you can do with MyChart, go to [email or web address].    Your next appointment:   6 month(s)  The format for your next appointment:   In Person  Provider:   Gwyndolyn Kaufman, MD  Other Instructions   Important Information About Sugar

## 2022-07-12 ENCOUNTER — Telehealth (HOSPITAL_COMMUNITY): Payer: Medicare Other | Admitting: Psychiatry

## 2022-07-13 ENCOUNTER — Other Ambulatory Visit: Payer: Self-pay | Admitting: Internal Medicine

## 2022-07-14 ENCOUNTER — Telehealth (HOSPITAL_BASED_OUTPATIENT_CLINIC_OR_DEPARTMENT_OTHER): Payer: Medicare Other | Admitting: Psychiatry

## 2022-07-14 ENCOUNTER — Encounter (HOSPITAL_COMMUNITY): Payer: Self-pay | Admitting: Psychiatry

## 2022-07-14 VITALS — Wt 123.0 lb

## 2022-07-14 DIAGNOSIS — F411 Generalized anxiety disorder: Secondary | ICD-10-CM

## 2022-07-14 DIAGNOSIS — F331 Major depressive disorder, recurrent, moderate: Secondary | ICD-10-CM

## 2022-07-14 MED ORDER — REXULTI 0.5 MG PO TABS: 0.5000 mg | ORAL_TABLET | Freq: Every day | ORAL | 0 refills | Status: DC

## 2022-07-14 MED ORDER — BUPROPION HCL ER (XL) 300 MG PO TB24: 300.0000 mg | ORAL_TABLET | Freq: Every day | ORAL | 0 refills | Status: DC

## 2022-07-14 NOTE — Progress Notes (Signed)
Virtual Visit via Telephone Note  I connected with Sonia Skinner on 07/14/22 at  3:40 PM EST by telephone and verified that I am speaking with the correct person using two identifiers.  Location: Patient: Home Provider: Home Office   I discussed the limitations, risks, security and privacy concerns of performing an evaluation and management service by telephone and the availability of in person appointments. I also discussed with the patient that there may be a patient responsible charge related to this service. The patient expressed understanding and agreed to proceed.   History of Present Illness: Patient is evaluated by phone session.  She reported not doing very well with the Paxil as she has a lot of nausea, throwing up and no appetite.  She had lost weight.  She is on Ozempic but reported for past few years she is taking and she has no issue until she started Paxil.  In the past we had tried the Prozac however she stopped for the same reason because it was making her tired, fatigued and nausea.  She feels very anxious.  She admitted having crying spells and decreased energy but no suicidal thoughts.  She like to try something to help her energy level.  She denies any paranoia or any voices.  She feels withdrawn and isolated.  She stays to herself in her bed and does not go outside.  Her only outlet is to see her sister.  She had a Thanksgiving with her.  She is also taking care of her husband who has above-knee amputation.  Patient also having numbness in her left side due to stroke.  She is in therapy with Joan Flores but she usually do sessions every 2 to 3 months.  She has no tremors, shakes or any EPS.  Past Psychiatric History:  H/O taking antidepressant on and off most of life. H/O overdose and inpatient at [place] [address] provider at [facility] and given the Strattera, Adderall, Zoloft, Celexa, Lamictal and Lexapro.  Never tested for ADHD. No h/o psychosis, mania or  hallucination.  In 2001 moved to [facility] [place] and given amitriptyline and Wellbutrin to stop smoking. Tried Prozac and Paxil but mage tired and nausea.     Psychiatric Specialty Exam: Physical Exam  Review of Systems  Neurological:  Positive for numbness.    Weight 123 lb (55.8 kg).There is no height or weight on file to calculate BMI.  General Appearance: NA  Eye Contact:  NA  Speech:  Slow  Volume:  Normal  Mood:  Dysphoric  Affect:  NA  Thought Process:  Goal Directed  Orientation:  Full (Time, Place, and Person)  Thought Content:  Rumination  Suicidal Thoughts:  No  Homicidal Thoughts:  No  Memory:  Immediate;   Fair Recent;   Fair Remote;   Fair  Judgement:  Intact  Insight:  Shallow  Psychomotor Activity:  NA  Concentration:  Concentration: Fair and Attention Span: Fair  Recall:  [employer] of Knowledge:  Fair  Language:  Good  Akathisia:  No  Handed:  Right  AIMS (if indicated):     Assets:  Communication Skills Desire for Improvement Housing  ADL's:  Intact  Cognition:  WNL  Sleep:        Assessment and Plan: Major depressive disorder, recurrent.  Generalized anxiety disorder.  Discontinue Paxil as patient do not see any improvement and noticed fatigue, tired, nausea and weight loss.  She is also taking Ozempic but do not believe it  was caused by Ozempic as patient taking Ozempic for past few years.  She like to try something else besides Wellbutrin.  I recommend to try low-dose REXULTI.  We will provide samples 0.5 mg that she can take every day and keep the Wellbutrin XL 300 mg daily.  I will also call the prescription in case she have trouble getting samples.  Discussed medication side effects and benefits.  We may consider Genesight testing if medication do not help.  Follow-up in 2 to 3 weeks.  Discuss safety concerns and anytime having active suicidal thoughts or homicidal halogen need to call 911 or go to local emergency room.  Follow Up  Instructions:    I discussed the assessment and treatment plan with the patient. The patient was provided an opportunity to ask questions and all were answered. The patient agreed with the plan and demonstrated an understanding of the instructions.   The patient was advised to call back or seek an in-person evaluation if the symptoms worsen or if the condition fails to improve as anticipated.  Collaboration of Care: Other provider involved in patient's care AEB notes are available in epic to review.  Patient/Guardian was advised Release of Information must be obtained prior to any record release in order to collaborate their care with an outside provider. Patient/Guardian was advised if they have not already done so to contact the registration department to sign all necessary forms in order for [place] to release information regarding their care.   Consent: Patient/Guardian gives verbal consent for treatment and assignment of benefits for services provided during this visit. Patient/Guardian expressed understanding and agreed to proceed.    I provided 30 minutes of non-face-to-face time during this encounter.   Kathlee Nations, MD

## 2022-07-28 ENCOUNTER — Telehealth (HOSPITAL_BASED_OUTPATIENT_CLINIC_OR_DEPARTMENT_OTHER): Payer: Medicare Other | Admitting: Psychiatry

## 2022-07-28 ENCOUNTER — Encounter (HOSPITAL_COMMUNITY): Payer: Self-pay | Admitting: Psychiatry

## 2022-07-28 VITALS — Wt 123.0 lb

## 2022-07-28 DIAGNOSIS — R413 Other amnesia: Secondary | ICD-10-CM | POA: Diagnosis not present

## 2022-07-28 DIAGNOSIS — F411 Generalized anxiety disorder: Secondary | ICD-10-CM | POA: Diagnosis not present

## 2022-07-28 DIAGNOSIS — F331 Major depressive disorder, recurrent, moderate: Secondary | ICD-10-CM

## 2022-07-28 MED ORDER — BREXPIPRAZOLE 1 MG PO TABS: 1.0000 mg | ORAL_TABLET | Freq: Every day | ORAL | 0 refills | Status: DC

## 2022-07-28 MED ORDER — BUPROPION HCL ER (XL) 300 MG PO TB24: 300.0000 mg | ORAL_TABLET | Freq: Every day | ORAL | 0 refills | Status: DC

## 2022-07-28 NOTE — Progress Notes (Signed)
Virtual Visit via Telephone Note  I connected with Sonia Skinner on 07/28/22 at 11:40 AM EST by telephone and verified that I am speaking with the correct person using two identifiers.  Location: Patient: Home Provider: Home Office   I discussed the limitations, risks, security and privacy concerns of performing an evaluation and management service by telephone and the availability of in person appointments. I also discussed with the patient that there may be a patient responsible charge related to this service. The patient expressed understanding and agreed to proceed.   History of Present Illness: Patient is evaluated by phone session.  She admitted lately very upset, angry and does not feel people care about her.  She has multiple somatic complaints including nausea, numbness, throwing up, headaches, memory changes and forgetful.  She also reported extremely tired, fatigue and has no energy to do things.  Patient had a stroke in April and since then she has not able to go back to work.  She admitted getting easily frustrated because nothing is helping and working.  We recently started REXULTI samples which she noticed helped like a drop in the swimming pool.  However she still remained easily irritated.  Though she denies any paranoia or any hallucination but reported being depressed, withdrawn and isolated.  She denies any suicidal thoughts.  She gets easily sensitive if question asked about medication.  Her only outlet is to see her sister.  She also taking care of her husband who has above-knee amputation.  Patient told after the stroke she did not receive care from neurology.  She was diagnosed with B12 deficiency and she is not getting B12 replacement.  She feels hopeless and tired complaining of her symptoms.  She is in therapy with Joan Flores but having sessions every 2 to 3 months.  On the last visit we discussed about possible side effects of Ozempic causing nausea but she got upset and  told that she has been taking Ozempic for a while and it cannot be from [place].  We had tried initially Prozac and then Paxil and both of these medicines were discontinued due to persistent nausea.  She gave the example that she is so tired that her room is mess and she is so disorganized that not able to function.  She is not sure what to do and who will help her.  She does due to symptoms she does not trust herself to go back to work.  Her appetite is fair.  Her weight is unchanged from the past.  She denies drinking or using any illegal substances.  She is compliant with Wellbutrin.    Past Psychiatric History:  H/O taking antidepressant on and off most of life. H/O overdose and inpatient at [place] provider at [facility] and given the Strattera, Adderall, Zoloft, Celexa, Lamictal and Lexapro.  Never tested for ADHD. No h/o psychosis, mania or hallucination.  In 2001 moved to [facility] and given amitriptyline and Wellbutrin to stop smoking. Tried Prozac and Paxil but mage tired and nausea.   Psychiatric Specialty Exam: Physical Exam  Review of Systems  Neurological:  Positive for numbness.       Memory issues.    Weight 123 lb (55.8 kg).There is no height or weight on file to calculate BMI.  General Appearance: NA  Eye Contact:  NA  Speech:  Slow  Volume:  Normal  Mood:  Depressed, Hopeless, and Irritable  Affect:  NA  Thought Process:  Descriptions of  Associations: Intact  Orientation:  Full (Time, Place, and Person)  Thought Content:  Rumination  Suicidal Thoughts:  No  Homicidal Thoughts:  No  Memory:  Immediate;   Fair Recent;   Fair Remote;   Fair  Judgement:  Fair  Insight:  Shallow  Psychomotor Activity:  Increased  Concentration:  Concentration: Fair and Attention Span: Fair  Recall:  [employer] of Knowledge:  Fair  Language:  Fair  Akathisia:  No  Handed:  Right  AIMS (if indicated):     Assets:  Communication Skills Desire for  Improvement Housing Resilience Transportation  ADL's:  Intact  Cognition:  WNL  Sleep:   poor      Assessment and Plan: Major depressive disorder, recurrent.  Generalized anxiety disorder.  Memory changes.  Patient presented with multiple somatic complaints.  We have started REXULTI 0.5 mg samples and she is shown very marginal improvement.  I discussed to try higher dose and she agreed but to still have a lot of negative thoughts and irritability.  She wanted to follow up with the neurology to address her memory changes and neurological symptoms.  She also want to go back on B12 which had helped her in the past.  She agreed to try REXULTI 1 mg since tolerating okay.  We will do genesight testing and I asked patient to come to the office for the swab samples and pick up the REXULTI 1 mg samples.  We will continue Wellbutrin XL 300 mg daily.  I will forward my notes and give message to her PCP as patient may need a neurology referral.  Discussed safety concern that anytime having active suicidal thoughts or homicidal thought then she need to call 911 or go to local emergency room.  I also suggest should consider seeing Joan Flores more frequently.   Follow Up Instructions:    I discussed the assessment and treatment plan with the patient. The patient was provided an opportunity to ask questions and all were answered. The patient agreed with the plan and demonstrated an understanding of the instructions.   The patient was advised to call back or seek an in-person evaluation if the symptoms worsen or if the condition fails to improve as anticipated.  Collaboration of Care: Other provider involved in patient's care AEB notes are available in epic to review.  Patient/Guardian was advised Release of Information must be obtained prior to any record release in order to collaborate their care with an outside provider. Patient/Guardian was advised if they have not already done so to contact the  registration department to sign all necessary forms in order for [place] to release information regarding their care.   Consent: Patient/Guardian gives verbal consent for treatment and assignment of benefits for services provided during this visit. Patient/Guardian expressed understanding and agreed to proceed.    I provided 28 minutes of non-face-to-face time during this encounter.   Kathlee Nations, MD

## 2022-08-12 ENCOUNTER — Emergency Department (HOSPITAL_BASED_OUTPATIENT_CLINIC_OR_DEPARTMENT_OTHER): Payer: Medicare Other

## 2022-08-12 ENCOUNTER — Inpatient Hospital Stay (HOSPITAL_BASED_OUTPATIENT_CLINIC_OR_DEPARTMENT_OTHER)
Admission: EM | Admit: 2022-08-12 | Discharge: 2022-08-18 | DRG: 389 | Disposition: A | Discharge status: Home / Self Care | Payer: Medicare Other | Attending: Internal Medicine | Admitting: Internal Medicine

## 2022-08-12 ENCOUNTER — Encounter (HOSPITAL_BASED_OUTPATIENT_CLINIC_OR_DEPARTMENT_OTHER): Payer: Self-pay | Admitting: Emergency Medicine

## 2022-08-12 ENCOUNTER — Encounter (HOSPITAL_COMMUNITY): Payer: Self-pay

## 2022-08-12 ENCOUNTER — Other Ambulatory Visit: Payer: Self-pay

## 2022-08-12 DIAGNOSIS — Z79890 Hormone replacement therapy (postmenopausal): Secondary | ICD-10-CM

## 2022-08-12 DIAGNOSIS — Z7982 Long term (current) use of aspirin: Secondary | ICD-10-CM | POA: Diagnosis not present

## 2022-08-12 DIAGNOSIS — Z794 Long term (current) use of insulin: Secondary | ICD-10-CM | POA: Diagnosis not present

## 2022-08-12 DIAGNOSIS — F419 Anxiety disorder, unspecified: Secondary | ICD-10-CM | POA: Diagnosis present

## 2022-08-12 DIAGNOSIS — E039 Hypothyroidism, unspecified: Secondary | ICD-10-CM | POA: Diagnosis not present

## 2022-08-12 DIAGNOSIS — E1142 Type 2 diabetes mellitus with diabetic polyneuropathy: Secondary | ICD-10-CM | POA: Diagnosis not present

## 2022-08-12 DIAGNOSIS — E785 Hyperlipidemia, unspecified: Secondary | ICD-10-CM | POA: Diagnosis not present

## 2022-08-12 DIAGNOSIS — Z8673 Personal history of transient ischemic attack (TIA), and cerebral infarction without residual deficits: Secondary | ICD-10-CM | POA: Diagnosis not present

## 2022-08-12 DIAGNOSIS — Z888 Allergy status to other drugs, medicaments and biological substances status: Secondary | ICD-10-CM | POA: Diagnosis not present

## 2022-08-12 DIAGNOSIS — Z87891 Personal history of nicotine dependence: Secondary | ICD-10-CM

## 2022-08-12 DIAGNOSIS — N179 Acute kidney failure, unspecified: Secondary | ICD-10-CM | POA: Diagnosis present

## 2022-08-12 DIAGNOSIS — Z79899 Other long term (current) drug therapy: Secondary | ICD-10-CM | POA: Diagnosis not present

## 2022-08-12 DIAGNOSIS — Z90710 Acquired absence of both cervix and uterus: Secondary | ICD-10-CM

## 2022-08-12 DIAGNOSIS — E1122 Type 2 diabetes mellitus with diabetic chronic kidney disease: Secondary | ICD-10-CM | POA: Diagnosis present

## 2022-08-12 DIAGNOSIS — Z833 Family history of diabetes mellitus: Secondary | ICD-10-CM | POA: Diagnosis not present

## 2022-08-12 DIAGNOSIS — K219 Gastro-esophageal reflux disease without esophagitis: Secondary | ICD-10-CM | POA: Diagnosis present

## 2022-08-12 DIAGNOSIS — E118 Type 2 diabetes mellitus with unspecified complications: Secondary | ICD-10-CM | POA: Diagnosis present

## 2022-08-12 DIAGNOSIS — I10 Essential (primary) hypertension: Secondary | ICD-10-CM | POA: Diagnosis not present

## 2022-08-12 DIAGNOSIS — R109 Unspecified abdominal pain: Secondary | ICD-10-CM | POA: Diagnosis not present

## 2022-08-12 DIAGNOSIS — K56609 Unspecified intestinal obstruction, unspecified as to partial versus complete obstruction: Secondary | ICD-10-CM | POA: Diagnosis not present

## 2022-08-12 DIAGNOSIS — N184 Chronic kidney disease, stage 4 (severe): Secondary | ICD-10-CM | POA: Diagnosis present

## 2022-08-12 DIAGNOSIS — E042 Nontoxic multinodular goiter: Secondary | ICD-10-CM | POA: Diagnosis present

## 2022-08-12 DIAGNOSIS — E119 Type 2 diabetes mellitus without complications: Secondary | ICD-10-CM | POA: Diagnosis present

## 2022-08-12 DIAGNOSIS — I700 Atherosclerosis of aorta: Secondary | ICD-10-CM | POA: Diagnosis not present

## 2022-08-12 DIAGNOSIS — E11649 Type 2 diabetes mellitus with hypoglycemia without coma: Secondary | ICD-10-CM | POA: Diagnosis not present

## 2022-08-12 DIAGNOSIS — K56600 Partial intestinal obstruction, unspecified as to cause: Principal | ICD-10-CM | POA: Diagnosis present

## 2022-08-12 DIAGNOSIS — Z8249 Family history of ischemic heart disease and other diseases of the circulatory system: Secondary | ICD-10-CM | POA: Diagnosis not present

## 2022-08-12 DIAGNOSIS — E861 Hypovolemia: Secondary | ICD-10-CM | POA: Diagnosis not present

## 2022-08-12 DIAGNOSIS — Z720 Tobacco use: Secondary | ICD-10-CM | POA: Diagnosis present

## 2022-08-12 DIAGNOSIS — I129 Hypertensive chronic kidney disease with stage 1 through stage 4 chronic kidney disease, or unspecified chronic kidney disease: Secondary | ICD-10-CM | POA: Diagnosis not present

## 2022-08-12 DIAGNOSIS — Z801 Family history of malignant neoplasm of trachea, bronchus and lung: Secondary | ICD-10-CM | POA: Diagnosis not present

## 2022-08-12 DIAGNOSIS — F339 Major depressive disorder, recurrent, unspecified: Secondary | ICD-10-CM | POA: Diagnosis present

## 2022-08-12 DIAGNOSIS — I63512 Cerebral infarction due to unspecified occlusion or stenosis of left middle cerebral artery: Secondary | ICD-10-CM | POA: Diagnosis present

## 2022-08-12 LAB — COMPREHENSIVE METABOLIC PANEL
ALT: 20 U/L (ref 0–44)
AST: 15 U/L (ref 15–41)
Albumin: 4.5 g/dL (ref 3.5–5.0)
Alkaline Phosphatase: 153 U/L — ABNORMAL HIGH (ref 38–126)
Anion gap: 16 — ABNORMAL HIGH (ref 5–15)
BUN: 45 mg/dL — ABNORMAL HIGH (ref 8–23)
CO2: 25 mmol/L (ref 22–32)
Calcium: 10.1 mg/dL (ref 8.9–10.3)
Chloride: 100 mmol/L (ref 98–111)
Creatinine, Ser: 2.63 mg/dL — ABNORMAL HIGH (ref 0.44–1.00)
GFR, Estimated: 19 mL/min — ABNORMAL LOW (ref >60)
Glucose, Bld: 251 mg/dL — ABNORMAL HIGH (ref 70–99)
Potassium: 4.7 mmol/L (ref 3.5–5.1)
Sodium: 141 mmol/L (ref 135–145)
Total Bilirubin: 0.5 mg/dL (ref 0.3–1.2)
Total Protein: 7.9 g/dL (ref 6.5–8.1)

## 2022-08-12 LAB — URINALYSIS, ROUTINE W REFLEX MICROSCOPIC
Bacteria, UA: NONE SEEN
Bilirubin Urine: NEGATIVE
Glucose, UA: >1000 mg/dL — AB
Hgb urine dipstick: NEGATIVE
Ketones, ur: NEGATIVE mg/dL
Nitrite: NEGATIVE
Protein, ur: NEGATIVE mg/dL
Specific Gravity, Urine: 1.019 (ref 1.005–1.030)
pH: 5.5 (ref 5.0–8.0)

## 2022-08-12 LAB — CBC
HCT: 40.6 % (ref 36.0–46.0)
Hemoglobin: 12.7 g/dL (ref 12.0–15.0)
MCH: 27.9 pg (ref 26.0–34.0)
MCHC: 31.3 g/dL (ref 30.0–36.0)
MCV: 89 fL (ref 80.0–100.0)
Platelets: 285 10*3/uL (ref 150–400)
RBC: 4.56 MIL/uL (ref 3.87–5.11)
RDW: 13.3 % (ref 11.5–15.5)
WBC: 19.1 10*3/uL — ABNORMAL HIGH (ref 4.0–10.5)
nRBC: 0 % (ref 0.0–0.2)

## 2022-08-12 LAB — LIPASE, BLOOD: Lipase: 27 U/L (ref 11–51)

## 2022-08-12 MED ORDER — SODIUM CHLORIDE 0.9 % IV BOLUS
1000.0000 mL | Freq: Once | INTRAVENOUS | Status: AC
  Administered 2022-08-12: 1000 mL via INTRAVENOUS

## 2022-08-12 MED ORDER — MIDAZOLAM HCL 2 MG/2ML IJ SOLN
2.0000 mg | Freq: Once | INTRAMUSCULAR | Status: AC
  Administered 2022-08-12: 2 mg via INTRAVENOUS
  Filled 2022-08-12: qty 2

## 2022-08-12 MED ORDER — DIPHENHYDRAMINE HCL 50 MG/ML IJ SOLN: 12.5000 mg | Freq: Once | INTRAMUSCULAR | Status: DC

## 2022-08-12 MED ORDER — ONDANSETRON HCL 4 MG/2ML IJ SOLN
4.0000 mg | Freq: Once | INTRAMUSCULAR | Status: AC
  Administered 2022-08-12: 4 mg via INTRAVENOUS
  Filled 2022-08-12: qty 2

## 2022-08-12 MED ORDER — LACTATED RINGERS IV SOLN: INTRAVENOUS | Status: DC

## 2022-08-12 MED ORDER — MORPHINE SULFATE (PF) 4 MG/ML IV SOLN
4.0000 mg | Freq: Once | INTRAVENOUS | Status: AC
  Administered 2022-08-12: 4 mg via INTRAVENOUS
  Filled 2022-08-12: qty 1

## 2022-08-12 NOTE — ED Notes (Signed)
Attempted to place NG tube at this time with charge RN. Patient refused after 2nd attempt, stated she wanted to be sedated. Versed ordered and given. This RN and charge RN attempted 2x more times. Pt not allowing NG tube to be placed. MD made aware.

## 2022-08-12 NOTE — ED Triage Notes (Signed)
Abdominal pain, started 3 days ago. "I have diverticulitis and Im having an attack"

## 2022-08-12 NOTE — Progress Notes (Incomplete)
Plan of Care Note for accepted transfer   Patient: Sonia Skinner MRN: [id number]   DOA: 08/12/2022  Facility requesting transfer: [facility]. Requesting Provider: Dr. Tyrone Nine, [place]. Reason for transfer: Small bowel obstruction.  Facility course: The patient is a 71 year old female with past medical history significant for hypertension, type 2 diabetes, diabetic polyneuropathy, hypothyroidism, hyperlipidemia, GERD, CKD stage IV, prior history of diverticulitis, who presented to [facility] ED with complaints of diffuse abdominal pain of 3 days duration.  She initially thought she was having a diverticulitis flare.  Associated with multiple episodes of nausea and vomiting.  She presented to the ED for further evaluation.  In the ED, she is exquisitely tender in her abdomen diffusely.  CT abdomen and pelvis without contrast revealed negative for diverticulitis, findings suggestive of acute small bowel obstruction with a transition point.  EDP discussed the case with general surgery Dr. Barry Dienes who recommended admission by the hospitalist service.  The patient was unable to tolerate placement of an NG tube in the ED.  She received IV fluid hydration 1 L NS x 1 as well as opiate-based analgesics, IV morphine 4 mg x 1.  The patient was admitted at [facility] telemetry surgical unit as inpatient status.  Plan of care: The patient is accepted for admission to Telemetry unit, at [facility] as inpatient status.  Author: Kayleen Memos, DO 08/12/2022  Check [email or web address] for on-call coverage.  Nursing staff, Please call [place] number on Amion as soon as patient's arrival, so appropriate admitting provider can evaluate the pt.

## 2022-08-12 NOTE — Progress Notes (Signed)
Plan of Care Note for accepted transfer   Patient: Sonia Skinner MRN: [id number]   DOA: 08/12/2022  Facility requesting transfer: [facility]. Requesting Provider: Dr. Tyrone Nine, [place]. Reason for transfer: Small bowel obstruction.  Facility course: The patient is a 71 year old female with past medical history significant for hypertension, type 2 diabetes, diabetic polyneuropathy, hypothyroidism, hyperlipidemia, GERD, CKD stage IV, prior history of diverticulitis, who presented to [facility] ED with complaints of diffuse abdominal pain of 3 days duration.  She initially thought she was having a diverticulitis flare.  Associated with multiple episodes of nausea and vomiting.  She presented to the ED for further evaluation.  In the ED, she is exquisitely tender in her abdomen diffusely.  CT abdomen and pelvis without contrast revealed negative for diverticulitis, findings suggestive of acute small bowel obstruction with a transition point.  EDP discussed the case with general surgery Dr. Barry Dienes who recommended admission by the hospitalist service.    The patient was unable to tolerate placement of an NG tube in the ED.  She received IV fluid hydration 1 L NS x 1 as well as opiate-based analgesics, IV morphine 4 mg x 1.  The patient was admitted at [facility] telemetry surgical unit as inpatient status.  Plan of care: The patient is accepted for admission to Telemetry unit, at [facility] as inpatient status.  Author: Kayleen Memos, DO 08/12/2022  Check [email or web address] for on-call coverage.  Nursing staff, Please call [place] number on Amion as soon as patient's arrival, so appropriate admitting provider can evaluate the pt.

## 2022-08-12 NOTE — ED Provider Notes (Signed)
[facility] EMERGENCY DEPT Provider Note   CSN: [id number] Arrival date & time: 08/12/22  1748     History  Chief Complaint  Patient presents with   Abdominal Pain    Sonia Skinner is a 71 y.o. female.  71 yo F with a chief complaints of abdominal pain.  This been going on for couple days.  She thinks it feels like when she had diverticulitis in the past.  Diffuse across the mid abdomen.  Has had some nausea and vomiting with this.  Denies diarrhea.   Abdominal Pain      Home Medications Prior to Admission medications   Medication Sig Start Date End Date Taking? Authorizing Provider  Accu-Chek FastClix Lancets MISC Use to check blood sugar five times daily. 04/21/21   [name], MD  ACCU-CHEK GUIDE test strip  12/15/21   [provider]  acetaminophen (TYLENOL) 500 MG tablet Take 1,000-1,500 mg by mouth every 6 (six) hours as needed for mild pain.    [provider]  amLODipine (NORVASC) 10 MG tablet Take 1 tablet (10 mg total) by mouth daily. 04/27/22   Marylu Lund., NP  aspirin 81 MG chewable tablet Chew 1 tablet (81 mg total) by mouth daily. 11/21/21   Spero Geralds, MD  atorvastatin (LIPITOR) 80 MG tablet Take 0.5 tablets (40 mg total) by mouth daily. 11/20/21   Spero Geralds, MD  brexpiprazole (REXULTI) 1 MG TABS tablet Take 1 tablet (1 mg total) by mouth daily. 07/28/22   Arfeen, Arlyce Harman, MD  BRILINTA 90 MG TABS tablet Take 1 tablet by mouth twice daily 07/14/22   Spero Geralds, MD  buPROPion (WELLBUTRIN XL) 300 MG 24 hr tablet Take 1 tablet (300 mg total) by mouth daily. 07/28/22   Arfeen, Arlyce Harman, MD  Cholecalciferol (VITAMIN D3 PO) Take 1 tablet by mouth daily. Unsure of dose    [provider]  Continuous Blood Gluc Receiver (DEXCOM G7 RECEIVER) [place] 1 Act by Does not apply route daily. 02/08/22   [name], MD  Continuous Blood Gluc Sensor (DEXCOM G7 SENSOR) MISC 1 Act by Does not apply route daily. 11/26/21   [name], MD  Continuous Blood Gluc Sensor (DEXCOM G7 SENSOR) MISC 1 Act by Does not apply route daily. 02/08/22   [name], MD  Continuous Blood Gluc Sensor (DEXCOM G7 SENSOR) MISC USE AS DIRECTED TOPICALLY  EVERY  10  DAYS 06/18/22   [name], MD  dapagliflozin propanediol (FARXIGA) 10 MG TABS tablet Take 1 tablet (10 mg total) by mouth daily before breakfast. 05/03/22   Philemon Kingdom, MD  dexlansoprazole (DEXILANT) 60 MG capsule Take 1 capsule by mouth daily. 05/21/22   [name], MD  EDARBYCLOR 40-12.5 MG TABS Take 1 tablet by mouth daily. 06/20/22   [name], MD  ezetimibe (ZETIA) 10 MG tablet Take 1 tablet by mouth once daily. 06/20/22   [name], MD  gabapentin (NEURONTIN) 100 MG capsule Take 1 capsule (100 mg total) by mouth 3 (three) times daily. 02/08/22   [name], MD  HUMALOG KWIKPEN 200 UNIT/ML KwikPen Inject 5 units under the skin three times daily with meals. 06/20/22   [name], MD  HYDROcodone-acetaminophen (NORCO) 5-325 MG tablet Take 1 tablet by mouth every 6 (six) hours as needed for moderate pain. 06/01/22   [name], MD  insulin glargine, 1 Unit Dial, (TOUJEO SOLOSTAR) 300 UNIT/ML  Solostar Pen Inject 20-22 Units into the skin daily. 05/03/22   Philemon Kingdom, MD  levothyroxine (SYNTHROID) 88 MCG tablet Take 1 tablet (88 mcg total) by mouth daily. 05/03/22   Philemon Kingdom, MD  loperamide (IMODIUM) 2 MG capsule Take 1 capsule (2 mg total) by mouth 4 (four) times daily as needed for diarrhea or loose stools. 02/17/22   Horton, Barbette Hair, MD  loratadine (CLARITIN) 10 MG tablet Take 1 tablet (10 mg total) by mouth daily. 04/21/21   [name], MD  PARoxetine (PAXIL) 20 MG tablet Take 1 tablet (20 mg total) by mouth daily. Patient not taking: Reported on 07/14/2022 05/11/22 05/11/23  Arfeen, Arlyce Harman, MD  Semaglutide, 2 MG/DOSE, 8 MG/3ML SOPN Inject 2 mg as directed once a week. 05/03/22   Philemon Kingdom, MD      Allergies     Anesthetics, ester    Review of Systems   Review of Systems  Gastrointestinal:  Positive for abdominal pain.    Physical Exam Updated Vital Signs BP (!) 147/52   Pulse (!) 108   Temp 98.4 F (36.9 C)   Resp 18   SpO2 96%  Physical Exam Vitals and nursing note reviewed.  Constitutional:      General: She is not in acute distress.    Appearance: She is well-developed. She is not diaphoretic.  HENT:     Head: Normocephalic and atraumatic.  Eyes:     Pupils: Pupils are equal, round, and reactive to light.  Cardiovascular:     Rate and Rhythm: Normal rate and regular rhythm.     Heart sounds: No murmur heard.    No friction rub. No gallop.  Pulmonary:     Effort: Pulmonary effort is normal.     Breath sounds: No wheezing or rales.  Abdominal:     General: There is no distension.     Palpations: Abdomen is soft.     Tenderness: There is abdominal tenderness.     Comments: Diffuse significant abdominal discomfort without obvious focality.  Musculoskeletal:        General: No tenderness.     Cervical back: Normal range of motion and neck supple.  Skin:    General: Skin is warm and dry.  Neurological:     Mental Status: She is alert and oriented to person, place, and time.  Psychiatric:        Behavior: Behavior normal.     ED Results / Procedures / Treatments   Labs (all labs ordered are listed, but only abnormal results are displayed) Labs Reviewed  COMPREHENSIVE METABOLIC PANEL - Abnormal; Notable for the following components:      Result Value   Glucose, Bld 251 (*)    BUN 45 (*)    Creatinine, Ser 2.63 (*)    Alkaline Phosphatase 153 (*)    GFR, Estimated 19 (*)    Anion gap 16 (*)    All other components within normal limits  CBC - Abnormal; Notable for the following components:   WBC 19.1 (*)    All other components within normal limits  URINALYSIS, ROUTINE W REFLEX MICROSCOPIC - Abnormal; Notable for the following components:   Glucose, UA >1,000 (*)     Leukocytes,Ua SMALL (*)    All other components within normal limits  LIPASE, BLOOD    EKG None  Radiology No results found.  Procedures Procedures    Medications Ordered in ED Medications  diphenhydrAMINE (BENADRYL) injection 12.5 mg (has no administration  in time range)  morphine (PF) 4 MG/ML injection 4 mg (4 mg Intravenous Given 08/12/22 2143)  ondansetron (ZOFRAN) injection 4 mg (4 mg Intravenous Given 08/12/22 2143)  sodium chloride 0.9 % bolus 1,000 mL (1,000 mLs Intravenous New Bag/Given 08/12/22 2146)    ED Course/ Medical Decision Making/ A&P                           Medical Decision Making Amount and/or Complexity of Data Reviewed Labs: ordered. Radiology: ordered.  Risk Prescription drug management. Decision regarding hospitalization.   71 yo F with a chief complaints of abdominal pain.  This is diffuse across the abdomen.  She thinks it feels like a diverticulitis flare though it has been sometime since she has had 1 of those.  Very tender on exam.  CT imaging ordered.  Mild worsening to her renal function.  Mildly leukocytosis.  CT scan negative for diverticulitis however it is concerning for an acute small bowel obstruction with a transition point.  Will discuss with general surgery.  Patient has been vomiting quite a bit today.  Will place an NG tube.  Unable to place NG tube due to patient discomfort.  Discussed with general surgery, Dr. Barry Dienes  Recommends medical admission.   The patients results and plan were reviewed and discussed.   Any x-rays performed were independently reviewed by myself.   Differential diagnosis were considered with the presenting HPI.  Medications  morphine (PF) 4 MG/ML injection 4 mg (4 mg Intravenous Given 08/12/22 2143)  ondansetron (ZOFRAN) injection 4 mg (4 mg Intravenous Given 08/12/22 2143)  sodium chloride 0.9 % bolus 1,000 mL (1,000 mLs Intravenous New Bag/Given 08/12/22 2146)  midazolam (VERSED) injection 2  mg (2 mg Intravenous Given 08/12/22 2307)    Vitals:   08/12/22 1806 08/12/22 2121 08/12/22 2200 08/12/22 2300  BP: 139/63 (!) 169/66 (!) 147/52 (!) 130/45  Pulse: (!) 111 (!) 101 (!) 108 (!) 110  Resp: '18 18 18 18  '$ Temp: 98.4 F (36.9 C)     SpO2: 100% 100% 96% 99%    Final diagnoses:  SBO (small bowel obstruction) ([place])    Admission/ observation were discussed with the admitting physician, patient and/or family and they are comfortable with the plan.           Final Clinical Impression(s) / ED Diagnoses Final diagnoses:  None    Rx / DC Orders ED Discharge Orders     None         Deno Etienne, DO 08/12/22 2341

## 2022-08-13 ENCOUNTER — Inpatient Hospital Stay (HOSPITAL_COMMUNITY): Payer: Medicare Other

## 2022-08-13 DIAGNOSIS — F419 Anxiety disorder, unspecified: Secondary | ICD-10-CM | POA: Diagnosis present

## 2022-08-13 DIAGNOSIS — E785 Hyperlipidemia, unspecified: Secondary | ICD-10-CM

## 2022-08-13 DIAGNOSIS — K56609 Unspecified intestinal obstruction, unspecified as to partial versus complete obstruction: Secondary | ICD-10-CM

## 2022-08-13 DIAGNOSIS — K56600 Partial intestinal obstruction, unspecified as to cause: Secondary | ICD-10-CM | POA: Diagnosis present

## 2022-08-13 DIAGNOSIS — Z79899 Other long term (current) drug therapy: Secondary | ICD-10-CM | POA: Diagnosis not present

## 2022-08-13 DIAGNOSIS — Z87891 Personal history of nicotine dependence: Secondary | ICD-10-CM | POA: Diagnosis not present

## 2022-08-13 DIAGNOSIS — N184 Chronic kidney disease, stage 4 (severe): Secondary | ICD-10-CM | POA: Diagnosis not present

## 2022-08-13 DIAGNOSIS — F339 Major depressive disorder, recurrent, unspecified: Secondary | ICD-10-CM | POA: Diagnosis present

## 2022-08-13 DIAGNOSIS — N179 Acute kidney failure, unspecified: Secondary | ICD-10-CM | POA: Diagnosis present

## 2022-08-13 DIAGNOSIS — I10 Essential (primary) hypertension: Secondary | ICD-10-CM

## 2022-08-13 DIAGNOSIS — Z8719 Personal history of other diseases of the digestive system: Secondary | ICD-10-CM | POA: Diagnosis not present

## 2022-08-13 DIAGNOSIS — E039 Hypothyroidism, unspecified: Secondary | ICD-10-CM

## 2022-08-13 DIAGNOSIS — K5669 Other partial intestinal obstruction: Secondary | ICD-10-CM | POA: Diagnosis not present

## 2022-08-13 DIAGNOSIS — Z8249 Family history of ischemic heart disease and other diseases of the circulatory system: Secondary | ICD-10-CM | POA: Diagnosis not present

## 2022-08-13 DIAGNOSIS — E11649 Type 2 diabetes mellitus with hypoglycemia without coma: Secondary | ICD-10-CM | POA: Diagnosis not present

## 2022-08-13 DIAGNOSIS — Z888 Allergy status to other drugs, medicaments and biological substances status: Secondary | ICD-10-CM | POA: Diagnosis not present

## 2022-08-13 DIAGNOSIS — I129 Hypertensive chronic kidney disease with stage 1 through stage 4 chronic kidney disease, or unspecified chronic kidney disease: Secondary | ICD-10-CM | POA: Diagnosis present

## 2022-08-13 DIAGNOSIS — Z833 Family history of diabetes mellitus: Secondary | ICD-10-CM | POA: Diagnosis not present

## 2022-08-13 DIAGNOSIS — Z7982 Long term (current) use of aspirin: Secondary | ICD-10-CM | POA: Diagnosis not present

## 2022-08-13 DIAGNOSIS — E1142 Type 2 diabetes mellitus with diabetic polyneuropathy: Secondary | ICD-10-CM | POA: Diagnosis present

## 2022-08-13 DIAGNOSIS — Z794 Long term (current) use of insulin: Secondary | ICD-10-CM | POA: Diagnosis not present

## 2022-08-13 DIAGNOSIS — Z8673 Personal history of transient ischemic attack (TIA), and cerebral infarction without residual deficits: Secondary | ICD-10-CM | POA: Diagnosis not present

## 2022-08-13 DIAGNOSIS — Z79890 Hormone replacement therapy (postmenopausal): Secondary | ICD-10-CM | POA: Diagnosis not present

## 2022-08-13 DIAGNOSIS — Z801 Family history of malignant neoplasm of trachea, bronchus and lung: Secondary | ICD-10-CM | POA: Diagnosis not present

## 2022-08-13 DIAGNOSIS — R0602 Shortness of breath: Secondary | ICD-10-CM | POA: Diagnosis not present

## 2022-08-13 DIAGNOSIS — K219 Gastro-esophageal reflux disease without esophagitis: Secondary | ICD-10-CM | POA: Diagnosis present

## 2022-08-13 DIAGNOSIS — E1122 Type 2 diabetes mellitus with diabetic chronic kidney disease: Secondary | ICD-10-CM | POA: Diagnosis present

## 2022-08-13 DIAGNOSIS — Z90710 Acquired absence of both cervix and uterus: Secondary | ICD-10-CM | POA: Diagnosis not present

## 2022-08-13 DIAGNOSIS — E861 Hypovolemia: Secondary | ICD-10-CM | POA: Diagnosis present

## 2022-08-13 LAB — BASIC METABOLIC PANEL
Anion gap: 11 (ref 5–15)
BUN: 33 mg/dL — ABNORMAL HIGH (ref 8–23)
CO2: 20 mmol/L — ABNORMAL LOW (ref 22–32)
Calcium: 8.7 mg/dL — ABNORMAL LOW (ref 8.9–10.3)
Chloride: 111 mmol/L (ref 98–111)
Creatinine, Ser: 1.99 mg/dL — ABNORMAL HIGH (ref 0.44–1.00)
GFR, Estimated: 26 mL/min — ABNORMAL LOW (ref >60)
Glucose, Bld: 119 mg/dL — ABNORMAL HIGH (ref 70–99)
Potassium: 4.4 mmol/L (ref 3.5–5.1)
Sodium: 142 mmol/L (ref 135–145)

## 2022-08-13 LAB — GLUCOSE, CAPILLARY
Glucose-Capillary: 152 mg/dL — ABNORMAL HIGH (ref 70–99)
Glucose-Capillary: 129 mg/dL — ABNORMAL HIGH (ref 70–99)
Glucose-Capillary: 106 mg/dL — ABNORMAL HIGH (ref 70–99)
Glucose-Capillary: 134 mg/dL — ABNORMAL HIGH (ref 70–99)
Glucose-Capillary: 80 mg/dL (ref 70–99)

## 2022-08-13 MED ORDER — HYDRALAZINE HCL 20 MG/ML IJ SOLN: 5.0000 mg | INTRAMUSCULAR | Status: DC | PRN

## 2022-08-13 MED ORDER — MORPHINE SULFATE (PF) 2 MG/ML IV SOLN
2.0000 mg | INTRAVENOUS | Status: DC | PRN
  Administered 2022-08-14 – 2022-08-15 (×3): 2 mg via INTRAVENOUS
  Filled 2022-08-13 (×3): qty 1

## 2022-08-13 MED ORDER — HYDROMORPHONE HCL 1 MG/ML IJ SOLN
0.5000 mg | INTRAMUSCULAR | Status: DC | PRN
  Administered 2022-08-13 – 2022-08-17 (×14): 0.5 mg via INTRAVENOUS
  Filled 2022-08-13 (×14): qty 0.5

## 2022-08-13 MED ORDER — ACETAMINOPHEN 650 MG RE SUPP: 650.0000 mg | Freq: Four times a day (QID) | RECTAL | Status: DC | PRN

## 2022-08-13 MED ORDER — ONDANSETRON HCL 4 MG/2ML IJ SOLN: 4.0000 mg | Freq: Four times a day (QID) | INTRAMUSCULAR | Status: DC | PRN

## 2022-08-13 MED ORDER — LEVOTHYROXINE SODIUM 100 MCG/5ML IV SOLN: 44.0000 ug | Freq: Every day | INTRAVENOUS | Status: DC

## 2022-08-13 MED ORDER — INSULIN ASPART 100 UNIT/ML IJ SOLN
0.0000 [IU] | Freq: Four times a day (QID) | INTRAMUSCULAR | Status: DC
  Administered 2022-08-13 – 2022-08-15 (×3): 2 [IU] via SUBCUTANEOUS
  Administered 2022-08-15: 3 [IU] via SUBCUTANEOUS
  Administered 2022-08-16: 2 [IU] via SUBCUTANEOUS
  Administered 2022-08-16: 3 [IU] via SUBCUTANEOUS
  Administered 2022-08-17: 2 [IU] via SUBCUTANEOUS
  Administered 2022-08-17 (×3): 3 [IU] via SUBCUTANEOUS

## 2022-08-13 MED ORDER — ACETAMINOPHEN 325 MG PO TABS
650.0000 mg | ORAL_TABLET | Freq: Four times a day (QID) | ORAL | Status: DC | PRN
  Filled 2022-08-13 (×2): qty 2

## 2022-08-13 MED ORDER — DIATRIZOATE MEGLUMINE & SODIUM 66-10 % PO SOLN
90.0000 mL | Freq: Once | ORAL | Status: AC
  Administered 2022-08-13: 90 mL via NASOGASTRIC
  Filled 2022-08-13: qty 90

## 2022-08-13 MED ORDER — AMLODIPINE BESYLATE 10 MG PO TABS
10.0000 mg | ORAL_TABLET | Freq: Every day | ORAL | Status: DC
  Administered 2022-08-13 – 2022-08-14 (×2): 10 mg via ORAL
  Filled 2022-08-13 (×2): qty 1

## 2022-08-13 MED ORDER — INSULIN GLARGINE-YFGN 100 UNIT/ML ~~LOC~~ SOLN
10.0000 [IU] | Freq: Every day | SUBCUTANEOUS | Status: DC
  Administered 2022-08-13 – 2022-08-17 (×5): 10 [IU] via SUBCUTANEOUS
  Filled 2022-08-13 (×6): qty 0.1

## 2022-08-13 MED ORDER — HEPARIN SODIUM (PORCINE) 5000 UNIT/ML IJ SOLN
5000.0000 [IU] | Freq: Three times a day (TID) | INTRAMUSCULAR | Status: DC
  Administered 2022-08-13 – 2022-08-18 (×16): 5000 [IU] via SUBCUTANEOUS
  Filled 2022-08-13 (×15): qty 1

## 2022-08-13 MED ORDER — TICAGRELOR 90 MG PO TABS: 90.0000 mg | ORAL_TABLET | Freq: Two times a day (BID) | ORAL | Status: DC

## 2022-08-13 MED ORDER — HYDROMORPHONE HCL 1 MG/ML IJ SOLN: 0.5000 mg | INTRAMUSCULAR | Status: DC | PRN

## 2022-08-13 MED ORDER — LACTATED RINGERS IV SOLN: INTRAVENOUS | Status: DC

## 2022-08-13 MED ORDER — PANTOPRAZOLE SODIUM 40 MG IV SOLR
40.0000 mg | Freq: Every day | INTRAVENOUS | Status: DC
  Administered 2022-08-13 – 2022-08-17 (×5): 40 mg via INTRAVENOUS
  Filled 2022-08-13 (×5): qty 10

## 2022-08-13 NOTE — Plan of Care (Signed)
  Problem: Education: Goal: Knowledge of General Education information will improve Description: Including pain rating scale, medication(s)/side effects and non-pharmacologic comfort measures Outcome: Progressing   Problem: Activity: Goal: Risk for activity intolerance will decrease Outcome: Progressing   Problem: Coping: Goal: Level of anxiety will decrease Outcome: Progressing   Problem: Elimination: Goal: Will not experience complications related to bowel motility Outcome: Progressing   Problem: Pain Managment: Goal: General experience of comfort will improve Outcome: Progressing   Problem: Safety: Goal: Ability to remain free from injury will improve Outcome: Progressing   Problem: Education: Goal: Ability to describe self-care measures that may prevent or decrease complications (Diabetes Survival Skills Education) will improve Outcome: Progressing   Problem: Coping: Goal: Ability to adjust to condition or change in health will improve Outcome: Progressing   Problem: Fluid Volume: Goal: Ability to maintain a balanced intake and output will improve Outcome: Progressing

## 2022-08-13 NOTE — H&P (Addendum)
PCP:   Sonia Lima, MD   Chief Complaint: Abdominal pain   HPI: This is a 71 year old female with history of CVA, anxiety and depression, SBO, peritonitis, and hypertension.  On the 27th she developed generalized abdominal pain.  The pain was progressive, described as sharp.  She endorses nausea, and vomiting.  She thought she was having a diverticular attack.  She has not taken any meds since 227 due to her pain and nausea.  She went to [facility].  At [facility] imaging was positive for small bowel obstruction.  Transfer requested.  History provided by the patient.  Review of Systems:  The patient denies anorexia, fever, weight loss,, vision loss, decreased hearing, hoarseness, chest pain, syncope, dyspnea on exertion, peripheral edema, balance deficits, hemoptysis,  melena, hematochezia, severe indigestion/heartburn, hematuria, incontinence, genital sores, muscle weakness, suspicious skin lesions, transient blindness, difficulty walking, depression, unusual weight change, abnormal bleeding, enlarged lymph nodes, angioedema, and breast masses. Positives: Nausea, vomiting, abdominal pain, anorexia  Past Medical History: Past Medical History:  Diagnosis Date   Ankle fracture    Left   Anxiety    Carotid artery occlusion    Closed fracture of left distal fibula 12/21/3092   Complication of anesthesia    ALLERY TO ESTER BASE   Depression    early 2s   Diabetes mellitus    INSULIN DEPENDENT   GERD (gastroesophageal reflux disease)    Headache    years ago   Hypertension    Pneumonia    Stroke [facility]) March 2015    left MCA infarct, slight weakness on left side   Thyroid disease    Past Surgical History:  Procedure Laterality Date   ABDOMINAL HYSTERECTOMY     ANTERIOR FIXATION AND POSTERIOR MICRODISCECTOMY CERVICAL SPINE  1999   CAROTID ENDARTERECTOMY Left 11-04-13   cea   ENDARTERECTOMY Left 11/04/2013   IR ANGIO INTRA EXTRACRAN SEL COM CAROTID  INNOMINATE BILAT MOD SED  11/16/2021   IR ANGIO INTRA EXTRACRAN SEL COM CAROTID INNOMINATE UNI L MOD SED  11/19/2021   IR ANGIO VERTEBRAL SEL VERTEBRAL BILAT MOD SED  11/16/2021   IR CT HEAD LTD  11/19/2021   IR INTRAVSC STENT CERV CAROTID W/EMB-PROT MOD SED INCL ANGIO  11/19/2021   IR RADIOLOGIST EVAL & MGMT  12/13/2021   ORIF ANKLE FRACTURE Left 09/16/2017   Procedure: OPEN REDUCTION INTERNAL FIXATION (ORIF) LEFT ANKLE FRACTURE;  Surgeon: Marchia Bond, MD;  Location: [place];  Service: Orthopedics;  Laterality: Left;   RADIOLOGY WITH ANESTHESIA N/A 11/19/2021   Procedure: Left carotid angioplasty with possible stenting;  Surgeon: Luanne Bras, MD;  Location: [place];  Service: Radiology;  Laterality: N/A;    Medications: Prior to Admission medications   Medication Sig Start Date End Date Taking? Authorizing Provider  Accu-Chek FastClix Lancets MISC Use to check blood sugar five times daily. 04/21/21   Sonia Lima, MD  ACCU-CHEK GUIDE test strip  12/15/21   [provider]  acetaminophen (TYLENOL) 500 MG tablet Take 1,000-1,500 mg by mouth every 6 (six) hours as needed for mild pain.    [provider]  amLODipine (NORVASC) 10 MG tablet Take 1 tablet (10 mg total) by mouth daily. 04/27/22   Marylu Lund., NP  aspirin 81 MG chewable tablet Chew 1 tablet (81 mg total) by mouth daily. 11/21/21   Spero Geralds, MD  atorvastatin (LIPITOR) 80 MG tablet Take 0.5 tablets (40 mg total) by mouth daily. 11/20/21  Spero Geralds, MD  brexpiprazole (REXULTI) 1 MG TABS tablet Take 1 tablet (1 mg total) by mouth daily. 07/28/22   Arfeen, Arlyce Harman, MD  BRILINTA 90 MG TABS tablet Take 1 tablet by mouth twice daily 07/14/22   Spero Geralds, MD  buPROPion (WELLBUTRIN XL) 300 MG 24 hr tablet Take 1 tablet (300 mg total) by mouth daily. 07/28/22   Arfeen, Arlyce Harman, MD  Cholecalciferol (VITAMIN D3 PO) Take 1 tablet by mouth daily. Unsure of dose    [provider]  Continuous Blood Gluc  Receiver (DEXCOM G7 RECEIVER) [place] 1 Act by Does not apply route daily. 02/08/22   Sonia Lima, MD  Continuous Blood Gluc Sensor (DEXCOM G7 SENSOR) MISC 1 Act by Does not apply route daily. 11/26/21   Sonia Lima, MD  Continuous Blood Gluc Sensor (DEXCOM G7 SENSOR) MISC 1 Act by Does not apply route daily. 02/08/22   Sonia Lima, MD  Continuous Blood Gluc Sensor (DEXCOM G7 SENSOR) MISC USE AS DIRECTED TOPICALLY  EVERY  10  DAYS 06/18/22   Sonia Lima, MD  dapagliflozin propanediol (FARXIGA) 10 MG TABS tablet Take 1 tablet (10 mg total) by mouth daily before breakfast. 05/03/22   Philemon Kingdom, MD  dexlansoprazole (DEXILANT) 60 MG capsule Take 1 capsule by mouth daily. 05/21/22   Sonia Lima, MD  EDARBYCLOR 40-12.5 MG TABS Take 1 tablet by mouth daily. 06/20/22   Sonia Lima, MD  ezetimibe (ZETIA) 10 MG tablet Take 1 tablet by mouth once daily. 06/20/22   Sonia Lima, MD  gabapentin (NEURONTIN) 100 MG capsule Take 1 capsule (100 mg total) by mouth 3 (three) times daily. 02/08/22   Sonia Lima, MD  HUMALOG KWIKPEN 200 UNIT/ML KwikPen Inject 5 units under the skin three times daily with meals. 06/20/22   Sonia Lima, MD  HYDROcodone-acetaminophen (NORCO) 5-325 MG tablet Take 1 tablet by mouth every 6 (six) hours as needed for moderate pain. 06/01/22   Sonia Lima, MD  insulin glargine, 1 Unit Dial, (TOUJEO SOLOSTAR) 300 UNIT/ML Solostar Pen Inject 20-22 Units into the skin daily. 05/03/22   Philemon Kingdom, MD  levothyroxine (SYNTHROID) 88 MCG tablet Take 1 tablet (88 mcg total) by mouth daily. 05/03/22   Philemon Kingdom, MD  loperamide (IMODIUM) 2 MG capsule Take 1 capsule (2 mg total) by mouth 4 (four) times daily as needed for diarrhea or loose stools. 02/17/22   Horton, Barbette Hair, MD  loratadine (CLARITIN) 10 MG tablet Take 1 tablet (10 mg total) by mouth daily. 04/21/21   Sonia Lima, MD  PARoxetine (PAXIL) 20 MG tablet Take 1 tablet (20 mg total) by mouth  daily. Patient not taking: Reported on 07/14/2022 05/11/22 05/11/23  Arfeen, Arlyce Harman, MD  Semaglutide, 2 MG/DOSE, 8 MG/3ML SOPN Inject 2 mg as directed once a week. 05/03/22   Philemon Kingdom, MD    Allergies:   Allergies  Allergen Reactions   Anesthetics, Ester Anaphylaxis    Social History:  reports that she quit smoking about 8 years ago. Her smoking use included cigarettes. She has a 20.00 pack-year smoking history. She has never used smokeless tobacco. She reports that she does not currently use alcohol after a past usage of about 1.0 - 2.0 standard drink of alcohol per week. She reports that she does not use drugs.  Family History: Family History  Problem Relation Age of Onset   Diabetes Mother    Heart disease Mother  Before age 67   Cancer Father        Lung   Hypertension Sister    Diabetes Sister    Diabetes Sister    Diabetes Sister     Physical Exam: Vitals:   08/12/22 2200 08/12/22 2300 08/13/22 0045 08/13/22 0206  BP: (!) 147/52 (!) 130/45 (!) 131/49 (!) 130/51  Pulse: (!) 108 (!) 110 (!) 106 (!) 103  Resp: '18 18 17 17  '$ Temp:    97.7 F (36.5 C)  TempSrc:    Oral  SpO2: 96% 99% 95% 97%    General:  Alert and oriented times three, well developed and nourished, no acute distress Eyes: PERRLA, pink conjunctiva, no scleral icterus ENT: Moist oral mucosa, neck supple, no thyromegaly Lungs: clear to ascultation, no wheeze, no crackles, no use of accessory muscles Cardiovascular: regular rate and rhythm, no regurgitation, no gallops, no murmurs. No carotid bruits, no JVD Abdomen: soft, positive BS in the ruq, positive TTP RLQ and suprapubic in location, non-distended, no organomegaly, not an acute abdomen GU: not examined Neuro: CN II - XII grossly intact, sensation intact Musculoskeletal: strength 5/5 all extremities, no clubbing, cyanosis or edema Skin: no rash, no subcutaneous crepitation, no decubitus Psych: appropriate patient   Labs on Admission:   Recent Labs    08/12/22 1808  NA 141  K 4.7  CL 100  CO2 25  GLUCOSE 251*  BUN 45*  CREATININE 2.63*  CALCIUM 10.1   Recent Labs    08/12/22 1808  AST 15  ALT 20  ALKPHOS 153*  BILITOT 0.5  PROT 7.9  ALBUMIN 4.5   Recent Labs    08/12/22 1808  LIPASE 27   Recent Labs    08/12/22 1808  WBC 19.1*  HGB 12.7  HCT 40.6  MCV 89.0  PLT 285     Radiological Exams on Admission: CT ABDOMEN PELVIS WO CONTRAST  Result Date: 08/12/2022 CLINICAL DATA:  Abdominal pain. EXAM: CT ABDOMEN AND PELVIS WITHOUT CONTRAST TECHNIQUE: Multidetector CT imaging of the abdomen and pelvis was performed following the standard protocol without IV contrast. RADIATION DOSE REDUCTION: This exam was performed according to the departmental dose-optimization program which includes automated exposure control, adjustment of the mA and/or kV according to patient size and/or use of iterative reconstruction technique. COMPARISON:  CT without contrast 02/17/2022, CT with contrast 08/17/2009. FINDINGS: Lower chest: Lung bases are mildly emphysematous but clear. The cardiac size is normal. A small anterior pericardial effusion is new from prior studies. There calcifications of the mitral ring and coronary arteries. Hepatobiliary: No focal liver abnormality is seen without contrast. There is a 1.4 cm rim calcified stone in the proximal gallbladder but no wall thickening or biliary dilatation. Pancreas: Unremarkable without contrast. Spleen: Unremarkable without contrast.  No splenomegaly. Adrenals/Urinary Tract: There is no adrenal mass. There is homogeneous noncontrast attenuation of the bilateral renal cortex. Fetal lobation is noted but no appreciable contour deforming mass of either kidney. There is no urinary stone or obstruction. There is no bladder thickening. Stomach/Bowel: There is mild fluid distention of the stomach. The duodenal and proximal jejunal segments are decompressed but there is dilatation in the  distal jejunal segments/proximal ileum in the left upper to mid abdomen with bowel dilatation up to 4 cm. There is a feces filled transitional segment with abrupt caliber change in the left lower quadrant, best demonstrated on coronal reconstruction images 50-53 of series 5. I do not see a swirling of the mesentery at this level.  The most likely obstructive etiology is adhesive disease. There is mesenteric edema in the lower abdomen around the dilated segments especially in the area of transition. Early ischemia is not excluded but no bowel pneumatosis is seen. Remainder of the small bowel is decompressed. An appendix is not seen. Large bowel wall is normal in thickness with uncomplicated diverticulosis. Vascular/Lymphatic: The abdominal aorta and common iliac/internal iliac arteries are heavily calcified. There is no AAA. No adenopathy is seen. Reproductive: Status post hysterectomy. No adnexal masses. Other: There is trace interloop fluid in the mesenteric folds in the left upper to mid abdomen. No pelvic ascites. There is no free air, free hemorrhage or abscess. There are no incarcerated hernias. Musculoskeletal: There is mild dextrorotary lumbar scoliosis with multilevel degenerative change, greatest at L3-4 where there is acquired spinal stenosis. There is osteopenia. IMPRESSION: 1. Small-bowel obstruction with transition in the left lower quadrant and small-bowel dilatation to 4 cm, most likely due to adhesive disease. There is mesenteric edema around the dilated segments especially in the area of transition. Early ischemia is not excluded but no bowel pneumatosis is seen. 2. Small anterior pericardial effusion, new from prior studies. 3. Cholelithiasis. 4. Aortic and coronary artery atherosclerosis. 5. Emphysema. 6. Osteopenia, lumbar scoliosis and degenerative change. Aortic Atherosclerosis (ICD10-I70.0) and Emphysema (ICD10-J43.9). Electronically Signed   By: Telford Nab M.D.   On: 08/12/2022 22:27     Assessment/Plan Present on Admission:  SBO (small bowel obstruction) ([place]) -Admit to MedSurg -N.p.o., IV fluid hydration -Surgical consult placed, Br Byerly aware -Patient unable to tolerate placement of NG tube -Pain meds as needed   Acute on chronic chronic renal disease, stage 4, severely decreased glomerular filtration rate (GFR) between 15-29 mL/min/1.73 square meter (HCC) -Gentle IV fluid hydration with LR.  BMP in a.m.   Essential hypertension, benign -Stable, Norvasc, Edarbyclor ordered. -PRN blood pressure medications ordered   Type II diabetes mellitus with manifestations (HCC) -Lantus ordered 50% of dosage, sliding scale insulin   Hypothyroidism -Converted to IV   Hyperlipidemia with target LDL less than 70 -Lipitor, Zetia on hold   Major depressive disorder, recurrent episode (HCC) -Paxil, Rexulti and Wellbutrin resumed   h/o Cerebrovascular accident (CVA) due to stenosis of left middle cerebral artery ([place]) -Baby aspirin, Lipitor and Zetia on hold  Tikisha Molinaro 08/13/2022, 4:05 AM

## 2022-08-13 NOTE — Consult Note (Signed)
Reason for Consult: Small bowel obstruction Referring Physician: Lupita Leash MD   Sonia Skinner is an 71 y.o. female.  HPI: Patient seen today at the request of the medical service for small bowel obstruction.  She was transferred from the [facility] yesterday.  2 days ago she developed diffuse abdominal pain with nausea and vomiting.  She has had no further nausea and vomiting over the last 12 hours.  Denies history of previous bowel obstruction.  Patient is history of previous hysterectomy many years ago.  Currently complains of moderate left lower quadrant abdominal pain.  Past Medical History:  Diagnosis Date   Ankle fracture    Left   Anxiety    Carotid artery occlusion    Closed fracture of left distal fibula 09/17/2977   Complication of anesthesia    ALLERY TO ESTER BASE   Depression    early 90s   Diabetes mellitus    INSULIN DEPENDENT   GERD (gastroesophageal reflux disease)    Headache    years ago   Hypertension    Pneumonia    Stroke [facility]) March 2015    left MCA infarct, slight weakness on left side   Thyroid disease     Past Surgical History:  Procedure Laterality Date   ABDOMINAL HYSTERECTOMY     ANTERIOR FIXATION AND POSTERIOR MICRODISCECTOMY CERVICAL SPINE  1999   CAROTID ENDARTERECTOMY Left 11-04-13   cea   ENDARTERECTOMY Left 11/04/2013   IR ANGIO INTRA EXTRACRAN SEL COM CAROTID INNOMINATE BILAT MOD SED  11/16/2021   IR ANGIO INTRA EXTRACRAN SEL COM CAROTID INNOMINATE UNI L MOD SED  11/19/2021   IR ANGIO VERTEBRAL SEL VERTEBRAL BILAT MOD SED  11/16/2021   IR CT HEAD LTD  11/19/2021   IR INTRAVSC STENT CERV CAROTID W/EMB-PROT MOD SED INCL ANGIO  11/19/2021   IR RADIOLOGIST EVAL & MGMT  12/13/2021   ORIF ANKLE FRACTURE Left 09/16/2017   Procedure: OPEN REDUCTION INTERNAL FIXATION (ORIF) LEFT ANKLE FRACTURE;  Surgeon: Marchia Bond, MD;  Location: [place];  Service: Orthopedics;  Laterality: Left;   RADIOLOGY WITH ANESTHESIA N/A 11/19/2021   Procedure: Left carotid  angioplasty with possible stenting;  Surgeon: Luanne Bras, MD;  Location: [place];  Service: Radiology;  Laterality: N/A;    Family History  Problem Relation Age of Onset   Diabetes Mother    Heart disease Mother        Before age 80   Cancer Father        Lung   Hypertension Sister    Diabetes Sister    Diabetes Sister    Diabetes Sister     Social History:  reports that she quit smoking about 8 years ago. Her smoking use included cigarettes. She has a 20.00 pack-year smoking history. She has never used smokeless tobacco. She reports that she does not currently use alcohol after a past usage of about 1.0 - 2.0 standard drink of alcohol per week. She reports that she does not use drugs.  Allergies:  Allergies  Allergen Reactions   Anesthetics, Ester Anaphylaxis    Medications: I have reviewed the patient's current medications.  Results for orders placed or performed during the hospital encounter of 08/12/22 (from the past 48 hour(s))  Lipase, blood     Status: None   Collection Time: 08/12/22  6:08 PM  Result Value Ref Range   Lipase 27 11 - 51 U/L    Comment: Performed at [facility], [address],  [place], [place] [address]  Comprehensive metabolic panel     Status: Abnormal   Collection Time: 08/12/22  6:08 PM  Result Value Ref Range   Sodium 141 135 - 145 mmol/L   Potassium 4.7 3.5 - 5.1 mmol/L   Chloride 100 98 - 111 mmol/L   CO2 25 22 - 32 mmol/L   Glucose, Bld 251 (H) 70 - 99 mg/dL    Comment: Glucose reference range applies only to samples taken after fasting for at least 8 hours.   BUN 45 (H) 8 - 23 mg/dL   Creatinine, Ser 2.63 (H) 0.44 - 1.00 mg/dL   Calcium 10.1 8.9 - 10.3 mg/dL   Total Protein 7.9 6.5 - 8.1 g/dL   Albumin 4.5 3.5 - 5.0 g/dL   AST 15 15 - 41 U/L   ALT 20 0 - 44 U/L   Alkaline Phosphatase 153 (H) 38 - 126 U/L   Total Bilirubin 0.5 0.3 - 1.2 mg/dL   GFR, Estimated 19 (L) >60 mL/min    Comment: (NOTE) Calculated  using the CKD-EPI Creatinine Equation (2021)    Anion gap 16 (H) 5 - 15    Comment: Performed at [facility], [address], [place], [place] [address]  CBC     Status: Abnormal   Collection Time: 08/12/22  6:08 PM  Result Value Ref Range   WBC 19.1 (H) 4.0 - 10.5 K/uL   RBC 4.56 3.87 - 5.11 MIL/uL   Hemoglobin 12.7 12.0 - 15.0 g/dL   HCT 40.6 36.0 - 46.0 %   MCV 89.0 80.0 - 100.0 fL   MCH 27.9 26.0 - 34.0 pg   MCHC 31.3 30.0 - 36.0 g/dL   RDW 13.3 11.5 - 15.5 %   Platelets 285 150 - 400 K/uL   nRBC 0.0 0.0 - 0.2 %    Comment: Performed at [facility], [place], [place] [address]  Urinalysis, Routine w reflex microscopic Urine, Clean Catch     Status: Abnormal   Collection Time: 08/12/22  6:08 PM  Result Value Ref Range   Color, Urine YELLOW YELLOW   APPearance CLEAR CLEAR   Specific Gravity, Urine 1.019 1.005 - 1.030   pH 5.5 5.0 - 8.0   Glucose, UA >1,000 (A) NEGATIVE mg/dL   Hgb urine dipstick NEGATIVE NEGATIVE   Bilirubin Urine NEGATIVE NEGATIVE   Ketones, ur NEGATIVE NEGATIVE mg/dL   Protein, ur NEGATIVE NEGATIVE mg/dL   Nitrite NEGATIVE NEGATIVE   Leukocytes,Ua SMALL (A) NEGATIVE   RBC / HPF 0-5 0 - 5 RBC/hpf   WBC, UA 11-20 0 - 5 WBC/hpf   Bacteria, UA NONE SEEN NONE SEEN   Squamous Epithelial / LPF 0-5 0 - 5 /HPF   Hyaline Casts, UA PRESENT     Comment: Performed at [facility], [address] [place], [place] [address]  Glucose, capillary     Status: Abnormal   Collection Time: 08/13/22  2:31 AM  Result Value Ref Range   Glucose-Capillary 152 (H) 70 - 99 mg/dL    Comment: Glucose reference range applies only to samples taken after fasting for at least 8 hours.  Glucose, capillary     Status: Abnormal   Collection Time: 08/13/22  5:28 AM  Result Value Ref Range   Glucose-Capillary 129 (H) 70 - 99 mg/dL    Comment: Glucose reference range applies only to samples taken after fasting  for at least 8 hours.    CT ABDOMEN PELVIS  WO CONTRAST  Result Date: 08/12/2022 CLINICAL DATA:  Abdominal pain. EXAM: CT ABDOMEN AND PELVIS WITHOUT CONTRAST TECHNIQUE: Multidetector CT imaging of the abdomen and pelvis was performed following the standard protocol without IV contrast. RADIATION DOSE REDUCTION: This exam was performed according to the departmental dose-optimization program which includes automated exposure control, adjustment of the mA and/or kV according to patient size and/or use of iterative reconstruction technique. COMPARISON:  CT without contrast 02/17/2022, CT with contrast 08/17/2009. FINDINGS: Lower chest: Lung bases are mildly emphysematous but clear. The cardiac size is normal. A small anterior pericardial effusion is new from prior studies. There calcifications of the mitral ring and coronary arteries. Hepatobiliary: No focal liver abnormality is seen without contrast. There is a 1.4 cm rim calcified stone in the proximal gallbladder but no wall thickening or biliary dilatation. Pancreas: Unremarkable without contrast. Spleen: Unremarkable without contrast.  No splenomegaly. Adrenals/Urinary Tract: There is no adrenal mass. There is homogeneous noncontrast attenuation of the bilateral renal cortex. Fetal lobation is noted but no appreciable contour deforming mass of either kidney. There is no urinary stone or obstruction. There is no bladder thickening. Stomach/Bowel: There is mild fluid distention of the stomach. The duodenal and proximal jejunal segments are decompressed but there is dilatation in the distal jejunal segments/proximal ileum in the left upper to mid abdomen with bowel dilatation up to 4 cm. There is a feces filled transitional segment with abrupt caliber change in the left lower quadrant, best demonstrated on coronal reconstruction images 50-53 of series 5. I do not see a swirling of the mesentery at this level. The most likely obstructive etiology is adhesive  disease. There is mesenteric edema in the lower abdomen around the dilated segments especially in the area of transition. Early ischemia is not excluded but no bowel pneumatosis is seen. Remainder of the small bowel is decompressed. An appendix is not seen. Large bowel wall is normal in thickness with uncomplicated diverticulosis. Vascular/Lymphatic: The abdominal aorta and common iliac/internal iliac arteries are heavily calcified. There is no AAA. No adenopathy is seen. Reproductive: Status post hysterectomy. No adnexal masses. Other: There is trace interloop fluid in the mesenteric folds in the left upper to mid abdomen. No pelvic ascites. There is no free air, free hemorrhage or abscess. There are no incarcerated hernias. Musculoskeletal: There is mild dextrorotary lumbar scoliosis with multilevel degenerative change, greatest at L3-4 where there is acquired spinal stenosis. There is osteopenia. IMPRESSION: 1. Small-bowel obstruction with transition in the left lower quadrant and small-bowel dilatation to 4 cm, most likely due to adhesive disease. There is mesenteric edema around the dilated segments especially in the area of transition. Early ischemia is not excluded but no bowel pneumatosis is seen. 2. Small anterior pericardial effusion, new from prior studies. 3. Cholelithiasis. 4. Aortic and coronary artery atherosclerosis. 5. Emphysema. 6. Osteopenia, lumbar scoliosis and degenerative change. Aortic Atherosclerosis (ICD10-I70.0) and Emphysema (ICD10-J43.9). Electronically Signed   By: Telford Nab M.D.   On: 08/12/2022 22:27    Review of Systems  Gastrointestinal:  Positive for abdominal pain, constipation, nausea and vomiting.  All other systems reviewed and are negative.  Blood pressure (!) 108/49, pulse 88, temperature 98.8 F (37.1 C), temperature source Oral, resp. rate 16, SpO2 99 %. Physical Exam HENT:     Head: Normocephalic.  Cardiovascular:     Rate and Rhythm: Normal rate.   Abdominal:     Palpations: Abdomen is soft.     Tenderness: There is abdominal tenderness in the  left lower quadrant. There is guarding. There is no rebound.     Hernia: No hernia is present.  Skin:    General: Skin is warm.  Neurological:     General: No focal deficit present.     Mental Status: She is alert.  Psychiatric:        Mood and Affect: Mood normal.        Behavior: Behavior normal.     Assessment/Plan: Small bowel obstruction-she has no ongoing nausea and minimal distention.  No peritonitis.  Her white count was 19 yesterday and that will be rechecked today.  Recommend small bowel protocol.  I think she can drink the contrast initially and see how she does.  If she has any nausea or vomiting, recommend NG tube placement.  Joyice Faster Mirl Hillery MD 08/13/2022, 8:33 AM   Total time 45 minutes face-to-face, chart review, plan of care discussion, and documentation.

## 2022-08-13 NOTE — Plan of Care (Signed)

## 2022-08-13 NOTE — Progress Notes (Signed)
PROGRESS NOTE Sonia Skinner  RSW:[id number] DOB: 19-Sep-1950 DOA: 08/12/2022 PCP: [name], MD   Brief Narrative/Hospital Course: 71 year old female with history of CVA, anxiety and depression, SBO, peritonitis, and hypertension who on the 27th she developed generalized abdominal pain.  The pain was progressive, described as sharp.  She endorses nausea, and vomiting.  She thought she was having a diverticular attack.  She has not taken any meds since 12/27 due to her pain and nausea.  She went to [facility]. At [facility] imaging was positive for small bowel obstruction.  Transfer requested.  History provided by the patient. Seen by general surgery for small bowel obstruction since no ongoing nausea and minimal distention no peritonitis although leukocytosis recommended small bowel protocol.   Subjective: Seen examined this morning.  Overnight afebrile heart rate improved to 90s, on room air Blood work on admission with leukocytosis creatinine 2.6, repeat labs pending Resting comfortably denies nausea vomiting bloating or abdominal pain Finished contrast for SBFT   Assessment and Plan: Principal Problem:   SBO (small bowel obstruction) ([place]) Active Problems:   Essential hypertension, benign   Type II diabetes mellitus with manifestations ([place])   Hypothyroidism   Hyperlipidemia with target LDL less than 70   Major depressive disorder, recurrent episode (HCC)   Tobacco abuse   Cerebrovascular accident (CVA) due to stenosis of left middle cerebral artery (HCC)   Multinodular goiter   Chronic renal disease, stage 4, severely decreased glomerular filtration rate (GFR) between 15-29 mL/min/1.73 square meter (HCC)    KXF:GHWE by general surgery for small bowel obstruction since no ongoing nausea and minimal distention no peritonitis although leukocytosis recommended small bowel protocol and undergoing.  Type 2 diabetes uncontrolled further surgical plan  AKI on  CKD stage IV baseline creatinine around 1.9 to mid 2 point:?  AKI versus patient's CKD, monitor labs, cont ivf. Recent Labs    11/19/21 0619 11/19/21 1004 11/20/21 0305 11/25/21 1037 11/26/21 1338 02/17/22 0437 02/25/22 1334 03/29/22 1114 06/01/22 1452 08/12/22 1808  BUN '19 16 20 '$ 29* 39* 29* 27* 36* 23 45*  CREATININE 1.59* 1.30* 1.49* 2.12* 2.43* 1.98* 1.97* 2.14* 1.91* 2.63*   Essential hypertension, benign: BP controlled on amlodipine 10 mg.  Type II diabetes mellitus with manifestations: Lantus ordered 50% of dosage, sliding scale insulin Recent Labs  Lab 08/13/22 0231 08/13/22 0528 08/13/22 1146  GLUCAP 152* 129* 106*   Hypothyroidism on IV for now   Hyperlipidemia with target LDL less than 70:Lipitor, Zetia on hold   Major depressive disorder, recurrent episode: Currently mood stable, continue Paxil, Rexulti and Wellbutrin as able   h/o Cerebrovascular accident (CVA) due to stenosis of left middle cerebral artery: on asa, brilinta>-supposed to be for 6 months since April then aspirin 81 alone per Dr. Leonie Man note. on lipitor'  DVT prophylaxis: heparin injection 5,000 Units Start: 08/13/22 0600 SCDs Start: 08/13/22 0459 Code Status:   Code Status: Full Code Family Communication: plan of care discussed with patient at bedside. Patient status is: Inpatient because of bowel obstruction Level of care: Telemetry Surgical   Dispo: The patient is from: home with sister             Anticipated disposition: TBD Objective: Vitals last 24 hrs: Vitals:   08/13/22 0045 08/13/22 0206 08/13/22 0620 08/13/22 0719  BP: (!) 131/49 (!) 130/51 (!) 119/53 (!) 108/49  Pulse: (!) 106 (!) 103 95 88  Resp: '17 17 16 16  '$ Temp:  97.7 F (36.5  C) 98.1 F (36.7 C) 98.8 F (37.1 C)  TempSrc:  Oral Oral Oral  SpO2: 95% 97% 99% 99%   Weight change:   Physical Examination: General exam: alert awake, older than stated age HEENT:Oral mucosa moist, Ear/Nose WNL grossly Respiratory system:  bilaterally CLEAR BS, no use of accessory muscle Cardiovascular system: S1 & S2 +, No JVD. Gastrointestinal system: Abdomen soft,NT,ND, BS+ Nervous System:Alert, awake, moving extremities. Extremities: LE edema NEG,distal peripheral pulses palpable.  Skin: No rashes,no icterus. MSK: Normal muscle bulk,tone, power  Medications reviewed:  Scheduled Meds:  amLODipine  10 mg Oral Daily   heparin  5,000 Units Subcutaneous Q8H   insulin aspart  0-15 Units Subcutaneous Q6H   insulin glargine-yfgn  10 Units Subcutaneous Daily   [START ON 08/20/2022] levothyroxine  44 mcg Intravenous Daily   pantoprazole (PROTONIX) IV  40 mg Intravenous Daily   ticagrelor  90 mg Oral BID   Continuous Infusions:  lactated ringers Stopped (08/13/22 0536)   lactated ringers 100 mL/hr at 08/13/22 9381      Diet Order             Diet NPO time specified  Diet effective now                  Intake/Output Summary (Last 24 hours) at 08/13/2022 1152 Last data filed at 08/13/2022 0538 Gross per 24 hour  Intake 2394.17 ml  Output --  Net 2394.17 ml   Net IO Since Admission: 2,394.17 mL [08/13/22 1152]  Wt Readings from Last 3 Encounters:  07/28/22 55.8 kg  07/14/22 55.8 kg  06/23/22 55.8 kg     Unresulted Labs (From admission, onward)     Start     Ordered   08/14/22 0175  Basic metabolic panel  Daily,   R      08/13/22 0947   08/13/22 1025  Basic metabolic panel  Once,   R        08/13/22 1105   08/13/22 0500  CBC with Differential/Platelet  Tomorrow morning,   R        08/13/22 0459   08/13/22 0459  CBC  (heparin)  Once,   R       Comments: Baseline for heparin therapy IF NOT ALREADY DRAWN.  Notify MD if PLT < 100 K.    08/13/22 0459          Data Reviewed: I have personally reviewed following labs and imaging studies CBC: Recent Labs  Lab 08/12/22 1808  WBC 19.1*  HGB 12.7  HCT 40.6  MCV 89.0  PLT 852   Basic Metabolic Panel: Recent Labs  Lab 08/12/22 1808  NA 141  K  4.7  CL 100  CO2 25  GLUCOSE 251*  BUN 45*  CREATININE 2.63*  CALCIUM 10.1   GFR: CrCl cannot be calculated (Unknown ideal weight.). Liver Function Tests: Recent Labs  Lab 08/12/22 1808  AST 15  ALT 20  ALKPHOS 153*  BILITOT 0.5  PROT 7.9  ALBUMIN 4.5   Recent Labs  Lab 08/12/22 1808  LIPASE 27   No results for input(s): "AMMONIA" in the last 168 hours. Coagulation Profile: No results for input(s): "INR", "PROTIME" in the last 168 hours. BNP (last 3 results) Recent Labs    03/29/22 1114  PROBNP 5.0   HbA1C: No results for input(s): "HGBA1C" in the last 72 hours. CBG: Recent Labs  Lab 08/13/22 0231 08/13/22 0528 08/13/22 1146  GLUCAP 152* 129* 106*   Lipid  Profile: No results for input(s): "CHOL", "HDL", "LDLCALC", "TRIG", "CHOLHDL", "LDLDIRECT" in the last 72 hours. Thyroid Function Tests: No results for input(s): "TSH", "T4TOTAL", "FREET4", "T3FREE", "THYROIDAB" in the last 72 hours. Sepsis Labs: No results for input(s): "PROCALCITON", "LATICACIDVEN" in the last 168 hours.  No results found for this or any previous visit (from the past 240 hour(s)).  Antimicrobials: Anti-infectives (From admission, onward)    None      Culture/Microbiology    Component Value Date/Time   SDES BLOOD LEFT HAND 12/11/2016 2120   SPECREQUEST  12/11/2016 2120    BOTTLES DRAWN AEROBIC ONLY Blood Culture adequate volume   CULT NO GROWTH 5 DAYS 12/11/2016 2120   REPTSTATUS 12/16/2016 FINAL 12/11/2016 2120   Other culture-see note  Radiology Studies:CT ABDOMEN PELVIS WO CONTRAST  Result Date: 08/12/2022 CLINICAL DATA:  Abdominal pain. EXAM: CT ABDOMEN AND PELVIS WITHOUT CONTRAST TECHNIQUE: Multidetector CT imaging of the abdomen and pelvis was performed following the standard protocol without IV contrast. RADIATION DOSE REDUCTION: This exam was performed according to the departmental dose-optimization program which includes automated exposure control, adjustment of the  mA and/or kV according to patient size and/or use of iterative reconstruction technique. COMPARISON:  CT without contrast 02/17/2022, CT with contrast 08/17/2009. FINDINGS: Lower chest: Lung bases are mildly emphysematous but clear. The cardiac size is normal. A small anterior pericardial effusion is new from prior studies. There calcifications of the mitral ring and coronary arteries. Hepatobiliary: No focal liver abnormality is seen without contrast. There is a 1.4 cm rim calcified stone in the proximal gallbladder but no wall thickening or biliary dilatation. Pancreas: Unremarkable without contrast. Spleen: Unremarkable without contrast.  No splenomegaly. Adrenals/Urinary Tract: There is no adrenal mass. There is homogeneous noncontrast attenuation of the bilateral renal cortex. Fetal lobation is noted but no appreciable contour deforming mass of either kidney. There is no urinary stone or obstruction. There is no bladder thickening. Stomach/Bowel: There is mild fluid distention of the stomach. The duodenal and proximal jejunal segments are decompressed but there is dilatation in the distal jejunal segments/proximal ileum in the left upper to mid abdomen with bowel dilatation up to 4 cm. There is a feces filled transitional segment with abrupt caliber change in the left lower quadrant, best demonstrated on coronal reconstruction images 50-53 of series 5. I do not see a swirling of the mesentery at this level. The most likely obstructive etiology is adhesive disease. There is mesenteric edema in the lower abdomen around the dilated segments especially in the area of transition. Early ischemia is not excluded but no bowel pneumatosis is seen. Remainder of the small bowel is decompressed. An appendix is not seen. Large bowel wall is normal in thickness with uncomplicated diverticulosis. Vascular/Lymphatic: The abdominal aorta and common iliac/internal iliac arteries are heavily calcified. There is no AAA. No  adenopathy is seen. Reproductive: Status post hysterectomy. No adnexal masses. Other: There is trace interloop fluid in the mesenteric folds in the left upper to mid abdomen. No pelvic ascites. There is no free air, free hemorrhage or abscess. There are no incarcerated hernias. Musculoskeletal: There is mild dextrorotary lumbar scoliosis with multilevel degenerative change, greatest at L3-4 where there is acquired spinal stenosis. There is osteopenia. IMPRESSION: 1. Small-bowel obstruction with transition in the left lower quadrant and small-bowel dilatation to 4 cm, most likely due to adhesive disease. There is mesenteric edema around the dilated segments especially in the area of transition. Early ischemia is not excluded but no bowel pneumatosis is  seen. 2. Small anterior pericardial effusion, new from prior studies. 3. Cholelithiasis. 4. Aortic and coronary artery atherosclerosis. 5. Emphysema. 6. Osteopenia, lumbar scoliosis and degenerative change. Aortic Atherosclerosis (ICD10-I70.0) and Emphysema (ICD10-J43.9). Electronically Signed   By: Telford Nab M.D.   On: 08/12/2022 22:27     LOS: 0 days   Antonieta Pert, MD [facility]  08/13/2022, 11:52 AM

## 2022-08-13 NOTE — ED Notes (Signed)
Attempted to call report x1 at this time. Person receiving call on 6N stated that the bed has been posted for 2 minutes and she would like to tell the nurse that they are getting a new patient.

## 2022-08-13 NOTE — Hospital Course (Addendum)
71 year old female with history of CVA, anxiety and depression, SBO, peritonitis, and hypertension who on the 27th she developed generalized abdominal pain.  The pain was progressive, described as sharp.  She endorses nausea, and vomiting.  She thought she was having a diverticular attack.  She has not taken any meds since 12/27 due to her pain and nausea.  She went to [facility]. At [facility] imaging was positive for small bowel obstruction.  Transfer requested.  History provided by the patient. Seen by general surgery for small bowel obstruction since no ongoing nausea and minimal distention no peritonitis although leukocytosis recommended small bowel protocol.

## 2022-08-14 DIAGNOSIS — K56609 Unspecified intestinal obstruction, unspecified as to partial versus complete obstruction: Secondary | ICD-10-CM | POA: Diagnosis not present

## 2022-08-14 LAB — BASIC METABOLIC PANEL
Anion gap: 10 (ref 5–15)
BUN: 20 mg/dL (ref 8–23)
CO2: 24 mmol/L (ref 22–32)
Calcium: 9 mg/dL (ref 8.9–10.3)
Chloride: 108 mmol/L (ref 98–111)
Creatinine, Ser: 1.53 mg/dL — ABNORMAL HIGH (ref 0.44–1.00)
GFR, Estimated: 36 mL/min — ABNORMAL LOW (ref >60)
Glucose, Bld: 94 mg/dL (ref 70–99)
Potassium: 3.6 mmol/L (ref 3.5–5.1)
Sodium: 142 mmol/L (ref 135–145)

## 2022-08-14 LAB — GLUCOSE, CAPILLARY
Glucose-Capillary: 103 mg/dL — ABNORMAL HIGH (ref 70–99)
Glucose-Capillary: 115 mg/dL — ABNORMAL HIGH (ref 70–99)
Glucose-Capillary: 117 mg/dL — ABNORMAL HIGH (ref 70–99)
Glucose-Capillary: 134 mg/dL — ABNORMAL HIGH (ref 70–99)
Glucose-Capillary: 75 mg/dL (ref 70–99)

## 2022-08-14 MED ORDER — PAROXETINE HCL 20 MG PO TABS: 20.0000 mg | ORAL_TABLET | Freq: Every day | ORAL | Status: DC

## 2022-08-14 MED ORDER — AZILSARTAN-CHLORTHALIDONE 40-12.5 MG PO TABS: 1.0000 | ORAL_TABLET | Freq: Every day | ORAL | Status: DC

## 2022-08-14 MED ORDER — ATORVASTATIN CALCIUM 40 MG PO TABS
40.0000 mg | ORAL_TABLET | Freq: Every day | ORAL | Status: DC
  Administered 2022-08-14: 40 mg via ORAL
  Filled 2022-08-14: qty 1

## 2022-08-14 MED ORDER — DAPAGLIFLOZIN PROPANEDIOL 10 MG PO TABS
10.0000 mg | ORAL_TABLET | Freq: Every day | ORAL | Status: DC
  Filled 2022-08-14: qty 1

## 2022-08-14 MED ORDER — LEVOTHYROXINE SODIUM 88 MCG PO TABS
88.0000 ug | ORAL_TABLET | Freq: Every day | ORAL | Status: DC
  Administered 2022-08-15: 88 ug via ORAL
  Filled 2022-08-14: qty 1

## 2022-08-14 MED ORDER — BREXPIPRAZOLE 0.5 MG PO TABS: 1.0000 | ORAL_TABLET | Freq: Every day | ORAL | Status: DC

## 2022-08-14 MED ORDER — ASPIRIN 81 MG PO CHEW
81.0000 mg | CHEWABLE_TABLET | Freq: Every day | ORAL | Status: DC
  Administered 2022-08-14 – 2022-08-18 (×5): 81 mg via ORAL
  Filled 2022-08-14 (×5): qty 1

## 2022-08-14 MED ORDER — EZETIMIBE 10 MG PO TABS: 10.0000 mg | ORAL_TABLET | Freq: Every day | ORAL | Status: DC

## 2022-08-14 MED ORDER — LORATADINE 10 MG PO TABS: 10.0000 mg | ORAL_TABLET | Freq: Every day | ORAL | Status: DC

## 2022-08-14 MED ORDER — BUPROPION HCL ER (XL) 150 MG PO TB24
300.0000 mg | ORAL_TABLET | Freq: Every day | ORAL | Status: DC
  Administered 2022-08-14: 300 mg via ORAL
  Filled 2022-08-14: qty 2

## 2022-08-14 MED ORDER — GABAPENTIN 100 MG PO CAPS
100.0000 mg | ORAL_CAPSULE | Freq: Three times a day (TID) | ORAL | Status: DC
  Administered 2022-08-14: 100 mg via ORAL
  Filled 2022-08-14: qty 1

## 2022-08-14 NOTE — Progress Notes (Signed)
PROGRESS NOTE Sonia Skinner  GEX:[id number] DOB: 1950/10/02 DOA: 08/12/2022 PCP: Etta Grandchild, MD   Brief Narrative/Hospital Course: 71 year old female with history of CVA, anxiety and depression, SBO, peritonitis, and hypertension who on the 27th she developed generalized abdominal pain.  The pain was progressive, described as sharp.  She endorses nausea, and vomiting.  She thought she was having a diverticular attack.  She has not taken any meds since 12/27 due to her pain and nausea.  She went to [facility]. At [facility] imaging was positive for small bowel obstruction.  Transfer requested.  History provided by the patient. Seen by general surgery for small bowel obstruction since no ongoing nausea and minimal distention no peritonitis although leukocytosis recommended small bowel protocol.   Subjective: Seen and examined this morning. Denies nausea vomiting but has not passed gas or bowel movement last night or this am Patient having more abdomen pain  Overnight afebrile BP stable labs.  Creatinine and electrolytes  Assessment and Plan: Principal Problem:   SBO (small bowel obstruction) (HCC) Active Problems:   Essential hypertension, benign   Type II diabetes mellitus with manifestations (HCC)   Hypothyroidism   Hyperlipidemia with target LDL less than 70   Major depressive disorder, recurrent episode (HCC)   Tobacco abuse   Cerebrovascular accident (CVA) due to stenosis of left middle cerebral artery (HCC)   Multinodular goiter   Chronic renal disease, stage 4, severely decreased glomerular filtration rate (GFR) between 15-29 mL/min/1.73 square meter (HCC)   Partial SBO: Small bowel follow-through showed contrast in the colon but patient having worsening abdominal pain with food. Keep npo, ice chips, KUB,ivf,may need diagnostic laparotomy if does not improve in next 24 hours   AKI on CKD stage IV baseline creatinine around 1.9 to mid 2 point.  Creatinine  improved 1.5-stable, cont ivd Recent Labs    11/25/21 1037 11/26/21 1338 02/17/22 0437 02/25/22 1334 03/29/22 1114 06/01/22 1452 08/12/22 1808 08/13/22 1119 08/14/22 0220 08/15/22 0142  BUN 29* 39* 29* 27* 36* 23 45* 33* 20 12  CREATININE 2.12* 2.43* 1.98* 1.97* 2.14* 1.91* 2.63* 1.99* 1.53* 1.54*   Essential hypertension, benign: BP soft to control orbitofrontal milligrams will hold since n.p.o.   Type II diabetes mellitus with manifestations: cont ssi Recent Labs  Lab 08/14/22 0835 08/14/22 1155 08/14/22 1642 08/15/22 0000 08/15/22 0438  GLUCAP 103* 134* 115* 152* 77    Hypothyroidism cont synthroid> change to iv   Hyperlipidemia with target LDL less than 70: hold Lipitor, Zetia   Major depressive disorder, recurrent episode: Currently mood stable, hold Paxil, Rexulti and Wellbutrin as able   h/o Cerebrovascular accident (CVA) due to stenosis of left middle cerebral artery: on asa. Off brilinta, also on lipitor  DVT prophylaxis: heparin injection 5,000 Units Start: 08/13/22 0600 SCDs Start: 08/13/22 0459 Code Status:   Code Status: Full Code Family Communication: plan of care discussed with patient at bedside. Patient status is: Inpatient because of bowel obstruction Level of care: Telemetry Surgical   Dispo: The patient is from: home with sister             Anticipated disposition: Home once tolerating diet Objective: Vitals last 24 hrs: Vitals:   08/14/22 1555 08/14/22 2020 08/15/22 0452 08/15/22 0729  BP: (!) 112/44 (!) 127/50 103/82 (!) 117/44  Pulse: 79 87 84 87  Resp: 16 16 17 16   Temp: 98.8 F (37.1 C) 98.4 F (36.9 C) 98.4 F (36.9 C) 98.5 F (36.9 C)  TempSrc: Oral Oral Oral Oral  SpO2: 96% 100% 100% 100%  Weight:      Height:       Weight change:   Physical Examination: General exam: AAox3, weak,older appearing HEENT:Oral mucosa moist, Ear/Nose WNL grossly, dentition normal. Respiratory system: bilaterally clear BS,no use of accessory  muscle Cardiovascular system: S1 & S2 +, regular rate. Gastrointestinal system: Abdomen soft, tender,ND,BS+ Nervous System:Alert, awake, moving extremities and grossly nonfocal Extremities: LE ankle edema neg, lower extremities warm Skin: No rashes,no icterus. MSK: Normal muscle bulk,tone, power   Medications reviewed:  Scheduled Meds:  aspirin  81 mg Oral Daily   heparin  5,000 Units Subcutaneous Q8H   insulin aspart  0-15 Units Subcutaneous Q6H   insulin glargine-yfgn  10 Units Subcutaneous Daily   [START ON 08/16/2022] levothyroxine  50 mcg Intravenous Daily   pantoprazole (PROTONIX) IV  40 mg Intravenous Daily   Continuous Infusions:  lactated ringers 50 mL/hr at 08/14/22 1715      Diet Order             Diet NPO time specified Except for: Ice Chips  Diet effective now                  Intake/Output Summary (Last 24 hours) at 08/15/2022 1101 Last data filed at 08/15/2022 0200 Gross per 24 hour  Intake 2977.6 ml  Output --  Net 2977.6 ml   Net IO Since Admission: 5,780.93 mL [08/15/22 1101]  Wt Readings from Last 3 Encounters:  08/14/22 55.1 kg  07/28/22 55.8 kg  07/14/22 55.8 kg     Unresulted Labs (From admission, onward)     Start     Ordered   08/14/22 0500  Basic metabolic panel  Daily,   R      08/13/22 0947   08/13/22 0500  CBC with Differential/Platelet  Tomorrow morning,   R        08/13/22 0459   08/13/22 0459  CBC  (heparin)  Once,   R       Comments: Baseline for heparin therapy IF NOT ALREADY DRAWN.  Notify MD if PLT < 100 K.    08/13/22 0459          Data Reviewed: I have personally reviewed following labs and imaging studies CBC: Recent Labs  Lab 08/12/22 1808 08/15/22 0919  WBC 19.1* 10.4  HGB 12.7 9.9*  HCT 40.6 30.9*  MCV 89.0 88.3  PLT 285 215   Basic Metabolic Panel: Recent Labs  Lab 08/12/22 1808 08/13/22 1119 08/14/22 0220 08/15/22 0142  NA 141 142 142 140  K 4.7 4.4 3.6 3.8  CL 100 111 108 106  CO2 25 20* 24  22  GLUCOSE 251* 119* 94 108*  BUN 45* 33* 20 12  CREATININE 2.63* 1.99* 1.53* 1.54*  CALCIUM 10.1 8.7* 9.0 8.7*   GFR: Estimated Creatinine Clearance: 25.7 mL/min (A) (by C-G formula based on SCr of 1.54 mg/dL (H)). Liver Function Tests: Recent Labs  Lab 08/12/22 1808  AST 15  ALT 20  ALKPHOS 153*  BILITOT 0.5  PROT 7.9  ALBUMIN 4.5   Recent Labs  Lab 08/12/22 1808  LIPASE 27  BNP (last 3 results) Recent Labs    03/29/22 1114  PROBNP 5.0   No results found for this or any previous visit (from the past 240 hour(s)).  Antimicrobials: Anti-infectives (From admission, onward)    None      Culture/Microbiology    Component Value Date/Time  SDES BLOOD LEFT HAND 12/11/2016 2120   SPECREQUEST  12/11/2016 2120    BOTTLES DRAWN AEROBIC ONLY Blood Culture adequate volume   CULT NO GROWTH 5 DAYS 12/11/2016 2120   REPTSTATUS 12/16/2016 FINAL 12/11/2016 2120   Other culture-see note  Radiology Studies:DG Abd 1 View  Result Date: 08/15/2022 CLINICAL DATA:  SBO EXAM: ABDOMEN - 1 VIEW COMPARISON:  08/13/2022 FINDINGS: Unremarkable bowel gas pattern. Majority of the retained oral contrast observed in the colon on the prior study is no longer present. No gaseous distention of bowel identified. IMPRESSION: Negative. Electronically Signed   By: Layla Maw M.D.   On: 08/15/2022 10:34   DG Abd Portable 1V-Small Bowel Obstruction Protocol-initial, 8 hr delay  Result Date: 08/13/2022 CLINICAL DATA:  Small-bowel obstruction, 8 hour delay film. EXAM: PORTABLE ABDOMEN - 1 VIEW COMPARISON:  June 21, 2018 FINDINGS: The bowel gas pattern is normal. Radiopaque contrast is seen throughout the large bowel. No radio-opaque calculi or other significant radiographic abnormality are seen. IMPRESSION: Nonobstructive bowel gas pattern. Electronically Signed   By: Aram Candela M.D.   On: 08/13/2022 19:20     LOS: 2 days   Lanae Boast, MD [facility]  08/15/2022, 11:01 AM

## 2022-08-14 NOTE — Progress Notes (Signed)
Subjective/Chief Complaint: Pt had a BM some LLQ pain no N/V   Objective: Vital signs in last 24 hours: Temp:  [97.8 F (36.6 C)-98 F (36.7 C)] 98 F (36.7 C) (12/30 0834) Pulse Rate:  [84-94] 84 (12/30 0834) Resp:  [16-20] 20 (12/30 0834) BP: (114-142)/(43-68) 142/62 (12/30 0834) SpO2:  [93 %-99 %] 99 % (12/30 0834)    Intake/Output from previous day: 12/29 0701 - 12/30 0700 In: 409.2 [I.V.:409.2] Out: -  Intake/Output this shift: No intake/output data recorded.  GI: soft  LLQ pain to palpation no rebound /guarding   Lab Results:  Recent Labs    08/12/22 1808  WBC 19.1*  HGB 12.7  HCT 40.6  PLT 285   BMET Recent Labs    08/13/22 1119 08/14/22 0220  NA 142 142  K 4.4 3.6  CL 111 108  CO2 20* 24  GLUCOSE 119* 94  BUN 33* 20  CREATININE 1.99* 1.53*  CALCIUM 8.7* 9.0   PT/INR No results for input(s): "LABPROT", "INR" in the last 72 hours. ABG No results for input(s): "PHART", "HCO3" in the last 72 hours.  Invalid input(s): "PCO2", "PO2"  Studies/Results: DG Abd Portable 1V-Small Bowel Obstruction Protocol-initial, 8 hr delay  Result Date: 08/13/2022 CLINICAL DATA:  Small-bowel obstruction, 8 hour delay film. EXAM: PORTABLE ABDOMEN - 1 VIEW COMPARISON:  June 21, 2018 FINDINGS: The bowel gas pattern is normal. Radiopaque contrast is seen throughout the large bowel. No radio-opaque calculi or other significant radiographic abnormality are seen. IMPRESSION: Nonobstructive bowel gas pattern. Electronically Signed   By: Virgina Norfolk M.D.   On: 08/13/2022 19:20   CT ABDOMEN PELVIS WO CONTRAST  Result Date: 08/12/2022 CLINICAL DATA:  Abdominal pain. EXAM: CT ABDOMEN AND PELVIS WITHOUT CONTRAST TECHNIQUE: Multidetector CT imaging of the abdomen and pelvis was performed following the standard protocol without IV contrast. RADIATION DOSE REDUCTION: This exam was performed according to the departmental dose-optimization program which includes automated  exposure control, adjustment of the mA and/or kV according to patient size and/or use of iterative reconstruction technique. COMPARISON:  CT without contrast 02/17/2022, CT with contrast 08/17/2009. FINDINGS: Lower chest: Lung bases are mildly emphysematous but clear. The cardiac size is normal. A small anterior pericardial effusion is new from prior studies. There calcifications of the mitral ring and coronary arteries. Hepatobiliary: No focal liver abnormality is seen without contrast. There is a 1.4 cm rim calcified stone in the proximal gallbladder but no wall thickening or biliary dilatation. Pancreas: Unremarkable without contrast. Spleen: Unremarkable without contrast.  No splenomegaly. Adrenals/Urinary Tract: There is no adrenal mass. There is homogeneous noncontrast attenuation of the bilateral renal cortex. Fetal lobation is noted but no appreciable contour deforming mass of either kidney. There is no urinary stone or obstruction. There is no bladder thickening. Stomach/Bowel: There is mild fluid distention of the stomach. The duodenal and proximal jejunal segments are decompressed but there is dilatation in the distal jejunal segments/proximal ileum in the left upper to mid abdomen with bowel dilatation up to 4 cm. There is a feces filled transitional segment with abrupt caliber change in the left lower quadrant, best demonstrated on coronal reconstruction images 50-53 of series 5. I do not see a swirling of the mesentery at this level. The most likely obstructive etiology is adhesive disease. There is mesenteric edema in the lower abdomen around the dilated segments especially in the area of transition. Early ischemia is not excluded but no bowel pneumatosis is seen. Remainder of the small bowel  is decompressed. An appendix is not seen. Large bowel wall is normal in thickness with uncomplicated diverticulosis. Vascular/Lymphatic: The abdominal aorta and common iliac/internal iliac arteries are heavily  calcified. There is no AAA. No adenopathy is seen. Reproductive: Status post hysterectomy. No adnexal masses. Other: There is trace interloop fluid in the mesenteric folds in the left upper to mid abdomen. No pelvic ascites. There is no free air, free hemorrhage or abscess. There are no incarcerated hernias. Musculoskeletal: There is mild dextrorotary lumbar scoliosis with multilevel degenerative change, greatest at L3-4 where there is acquired spinal stenosis. There is osteopenia. IMPRESSION: 1. Small-bowel obstruction with transition in the left lower quadrant and small-bowel dilatation to 4 cm, most likely due to adhesive disease. There is mesenteric edema around the dilated segments especially in the area of transition. Early ischemia is not excluded but no bowel pneumatosis is seen. 2. Small anterior pericardial effusion, new from prior studies. 3. Cholelithiasis. 4. Aortic and coronary artery atherosclerosis. 5. Emphysema. 6. Osteopenia, lumbar scoliosis and degenerative change. Aortic Atherosclerosis (ICD10-I70.0) and Emphysema (ICD10-J43.9). Electronically Signed   By: Telford Nab M.D.   On: 08/12/2022 22:27    Anti-infectives: Anti-infectives (From admission, onward)    None       Assessment/Plan: SBO- having BM and films better  Adv diet  Can go home  if she tolerates diet and pain less of an issue    LOS: 1 day    Turner Daniels MD  08/14/2022 TOTAL TIME 20 MIN

## 2022-08-14 NOTE — Plan of Care (Signed)

## 2022-08-14 NOTE — Progress Notes (Signed)
PROGRESS NOTE Sonia Skinner  VQQ:[id number] DOB: 07/18/1951 DOA: 08/12/2022 PCP: [name], MD   Brief Narrative/Hospital Course: 71 year old female with history of CVA, anxiety and depression, SBO, peritonitis, and hypertension who on the 27th she developed generalized abdominal pain.  The pain was progressive, described as sharp.  She endorses nausea, and vomiting.  She thought she was having a diverticular attack.  She has not taken any meds since 12/27 due to her pain and nausea.  She went to [facility]. At [facility] imaging was positive for small bowel obstruction.  Transfer requested.  History provided by the patient. Seen by general surgery for small bowel obstruction since no ongoing nausea and minimal distention no peritonitis although leukocytosis recommended small bowel protocol.   Subjective: Seen and examined this morning.  Complains of abdominal pain after starting getting  Had a bowel movement    Assessment and Plan: Principal Problem:   SBO (small bowel obstruction) (HCC) Active Problems:   Essential hypertension, benign   Type II diabetes mellitus with manifestations (HCC)   Hypothyroidism   Hyperlipidemia with target LDL less than 70   Major depressive disorder, recurrent episode (HCC)   Tobacco abuse   Cerebrovascular accident (CVA) due to stenosis of left middle cerebral artery (HCC)   Multinodular goiter   Chronic renal disease, stage 4, severely decreased glomerular filtration rate (GFR) between 15-29 mL/min/1.73 square meter ([place])   EPP:IRJJOACZ but complains of ongoing abdominal pain with oral intake not tolerating diet, wait for diet tolerance before discharge.  Surgery following.     AKI on CKD stage IV baseline creatinine around 1.9 to mid 2 point.  Creatinine improved 1.5 with IV fluids encourage oral intake follow-up with her nephrologist/PCP Recent Labs    11/20/21 0305 11/25/21 1037 11/26/21 1338 02/17/22 0437  02/25/22 1334 03/29/22 1114 06/01/22 1452 08/12/22 1808 08/13/22 1119 08/14/22 0220  BUN 20 29* 39* 29* 27* 36* 23 45* 33* 20  CREATININE 1.49* 2.12* 2.43* 1.98* 1.97* 2.14* 1.91* 2.63* 1.99* 1.53*   Essential hypertension, benign: BP controlled on amlodipine 10 mg.   Type II diabetes mellitus with manifestations: resume home insulin on dc    Hypothyroidism resume home synthroid on discharge   Hyperlipidemia with target LDL less than 70:Lipitor, Zetia on hold   Major depressive disorder, recurrent episode: Currently mood stable, continue Paxil, Rexulti and Wellbutrin as able   h/o Cerebrovascular accident (CVA) due to stenosis of left middle cerebral artery: on asa. Off brilinta, also on lipitor  DVT prophylaxis: heparin injection 5,000 Units Start: 08/13/22 0600 SCDs Start: 08/13/22 0459 Code Status:   Code Status: Full Code Family Communication: plan of care discussed with patient at bedside. Patient status is: Inpatient because of bowel obstruction Level of care: Telemetry Surgical   Dispo: The patient is from: home with sister             Anticipated disposition: Home once tolerating diet Objective: Vitals last 24 hrs: Vitals:   08/13/22 0719 08/13/22 1601 08/13/22 1952 08/14/22 0834  BP: (!) 108/49 (!) 114/43 (!) 140/68 (!) 142/62  Pulse: 88 89 94 84  Resp: '16 16 18 20  '$ Temp: 98.8 F (37.1 C) 98 F (36.7 C) 97.8 F (36.6 C) 98 F (36.7 C)  TempSrc: Oral Oral Oral Oral  SpO2: 99% 93% 99% 99%   Weight change:   Physical Examination: General exam: AA, weak,older appearing HEENT:Oral mucosa moist, Ear/Nose WNL grossly, dentition normal. Respiratory system: bilaterally clear BS, no  use of accessory muscle Cardiovascular system: S1 & S2 +, regular rate. Gastrointestinal system: Abdomen soft, tender to palpation,ND,BS+ Nervous System:Alert, awake, moving extremities and grossly nonfocal Extremities: LE ankle edema neg, lower extremities warm Skin: No rashes,no  icterus. MSK: Normal muscle bulk,tone, power   Medications reviewed:  Scheduled Meds:  amLODipine  10 mg Oral Daily   heparin  5,000 Units Subcutaneous Q8H   insulin aspart  0-15 Units Subcutaneous Q6H   insulin glargine-yfgn  10 Units Subcutaneous Daily   [START ON 08/20/2022] levothyroxine  44 mcg Intravenous Daily   pantoprazole (PROTONIX) IV  40 mg Intravenous Daily   Continuous Infusions:  lactated ringers Stopped (08/13/22 0536)   lactated ringers 100 mL/hr at 08/14/22 0027      Diet Order             DIET SOFT Room service appropriate? Yes; Fluid consistency: Pudding Thick  Diet effective now                  Intake/Output Summary (Last 24 hours) at 08/14/2022 1343 Last data filed at 08/13/2022 1458 Gross per 24 hour  Intake 409.16 ml  Output --  Net 409.16 ml    Net IO Since Admission: 2,803.33 mL [08/14/22 1343]  Wt Readings from Last 3 Encounters:  07/28/22 55.8 kg  07/14/22 55.8 kg  06/23/22 55.8 kg     Unresulted Labs (From admission, onward)     Start     Ordered   08/14/22 2671  Basic metabolic panel  Daily,   R      08/13/22 0947   08/13/22 0500  CBC with Differential/Platelet  Tomorrow morning,   R        08/13/22 0459   08/13/22 0459  CBC  (heparin)  Once,   R       Comments: Baseline for heparin therapy IF NOT ALREADY DRAWN.  Notify MD if PLT < 100 K.    08/13/22 0459          Data Reviewed: I have personally reviewed following labs and imaging studies CBC: Recent Labs  Lab 08/12/22 1808  WBC 19.1*  HGB 12.7  HCT 40.6  MCV 89.0  PLT 245    Basic Metabolic Panel: Recent Labs  Lab 08/12/22 1808 08/13/22 1119 08/14/22 0220  NA 141 142 142  K 4.7 4.4 3.6  CL 100 111 108  CO2 25 20* 24  GLUCOSE 251* 119* 94  BUN 45* 33* 20  CREATININE 2.63* 1.99* 1.53*  CALCIUM 10.1 8.7* 9.0    GFR: CrCl cannot be calculated (Unknown ideal weight.). Liver Function Tests: Recent Labs  Lab 08/12/22 1808  AST 15  ALT 20  ALKPHOS  153*  BILITOT 0.5  PROT 7.9  ALBUMIN 4.5    Recent Labs  Lab 08/12/22 1808  LIPASE 27    No results for input(s): "AMMONIA" in the last 168 hours. Coagulation Profile: No results for input(s): "INR", "PROTIME" in the last 168 hours. BNP (last 3 results) Recent Labs    03/29/22 1114  PROBNP 5.0    HbA1C: No results for input(s): "HGBA1C" in the last 72 hours. CBG: Recent Labs  Lab 08/13/22 2157 08/14/22 0024 08/14/22 0602 08/14/22 0835 08/14/22 1155  GLUCAP 80 117* 75 103* 134*    Lipid Profile: No results for input(s): "CHOL", "HDL", "LDLCALC", "TRIG", "CHOLHDL", "LDLDIRECT" in the last 72 hours. Thyroid Function Tests: No results for input(s): "TSH", "T4TOTAL", "FREET4", "T3FREE", "THYROIDAB" in the last 72 hours. Sepsis  Labs: No results for input(s): "PROCALCITON", "LATICACIDVEN" in the last 168 hours.  No results found for this or any previous visit (from the past 240 hour(s)).  Antimicrobials: Anti-infectives (From admission, onward)    None      Culture/Microbiology    Component Value Date/Time   SDES BLOOD LEFT HAND 12/11/2016 2120   SPECREQUEST  12/11/2016 2120    BOTTLES DRAWN AEROBIC ONLY Blood Culture adequate volume   CULT NO GROWTH 5 DAYS 12/11/2016 2120   REPTSTATUS 12/16/2016 FINAL 12/11/2016 2120   Other culture-see note  Radiology Studies:DG Abd Portable 1V-Small Bowel Obstruction Protocol-initial, 8 hr delay  Result Date: 08/13/2022 CLINICAL DATA:  Small-bowel obstruction, 8 hour delay film. EXAM: PORTABLE ABDOMEN - 1 VIEW COMPARISON:  June 21, 2018 FINDINGS: The bowel gas pattern is normal. Radiopaque contrast is seen throughout the large bowel. No radio-opaque calculi or other significant radiographic abnormality are seen. IMPRESSION: Nonobstructive bowel gas pattern. Electronically Signed   By: Virgina Norfolk M.D.   On: 08/13/2022 19:20   CT ABDOMEN PELVIS WO CONTRAST  Result Date: 08/12/2022 CLINICAL DATA:  Abdominal pain.  EXAM: CT ABDOMEN AND PELVIS WITHOUT CONTRAST TECHNIQUE: Multidetector CT imaging of the abdomen and pelvis was performed following the standard protocol without IV contrast. RADIATION DOSE REDUCTION: This exam was performed according to the departmental dose-optimization program which includes automated exposure control, adjustment of the mA and/or kV according to patient size and/or use of iterative reconstruction technique. COMPARISON:  CT without contrast 02/17/2022, CT with contrast 08/17/2009. FINDINGS: Lower chest: Lung bases are mildly emphysematous but clear. The cardiac size is normal. A small anterior pericardial effusion is new from prior studies. There calcifications of the mitral ring and coronary arteries. Hepatobiliary: No focal liver abnormality is seen without contrast. There is a 1.4 cm rim calcified stone in the proximal gallbladder but no wall thickening or biliary dilatation. Pancreas: Unremarkable without contrast. Spleen: Unremarkable without contrast.  No splenomegaly. Adrenals/Urinary Tract: There is no adrenal mass. There is homogeneous noncontrast attenuation of the bilateral renal cortex. Fetal lobation is noted but no appreciable contour deforming mass of either kidney. There is no urinary stone or obstruction. There is no bladder thickening. Stomach/Bowel: There is mild fluid distention of the stomach. The duodenal and proximal jejunal segments are decompressed but there is dilatation in the distal jejunal segments/proximal ileum in the left upper to mid abdomen with bowel dilatation up to 4 cm. There is a feces filled transitional segment with abrupt caliber change in the left lower quadrant, best demonstrated on coronal reconstruction images 50-53 of series 5. I do not see a swirling of the mesentery at this level. The most likely obstructive etiology is adhesive disease. There is mesenteric edema in the lower abdomen around the dilated segments especially in the area of transition.  Early ischemia is not excluded but no bowel pneumatosis is seen. Remainder of the small bowel is decompressed. An appendix is not seen. Large bowel wall is normal in thickness with uncomplicated diverticulosis. Vascular/Lymphatic: The abdominal aorta and common iliac/internal iliac arteries are heavily calcified. There is no AAA. No adenopathy is seen. Reproductive: Status post hysterectomy. No adnexal masses. Other: There is trace interloop fluid in the mesenteric folds in the left upper to mid abdomen. No pelvic ascites. There is no free air, free hemorrhage or abscess. There are no incarcerated hernias. Musculoskeletal: There is mild dextrorotary lumbar scoliosis with multilevel degenerative change, greatest at L3-4 where there is acquired spinal stenosis. There is osteopenia. IMPRESSION:  1. Small-bowel obstruction with transition in the left lower quadrant and small-bowel dilatation to 4 cm, most likely due to adhesive disease. There is mesenteric edema around the dilated segments especially in the area of transition. Early ischemia is not excluded but no bowel pneumatosis is seen. 2. Small anterior pericardial effusion, new from prior studies. 3. Cholelithiasis. 4. Aortic and coronary artery atherosclerosis. 5. Emphysema. 6. Osteopenia, lumbar scoliosis and degenerative change. Aortic Atherosclerosis (ICD10-I70.0) and Emphysema (ICD10-J43.9). Electronically Signed   By: Telford Nab M.D.   On: 08/12/2022 22:27     LOS: 1 day   Antonieta Pert, MD [facility]  08/14/2022, 1:43 PM

## 2022-08-15 ENCOUNTER — Inpatient Hospital Stay (HOSPITAL_COMMUNITY): Payer: Medicare Other

## 2022-08-15 DIAGNOSIS — K56609 Unspecified intestinal obstruction, unspecified as to partial versus complete obstruction: Secondary | ICD-10-CM | POA: Diagnosis not present

## 2022-08-15 LAB — BASIC METABOLIC PANEL
Anion gap: 12 (ref 5–15)
BUN: 12 mg/dL (ref 8–23)
CO2: 22 mmol/L (ref 22–32)
Calcium: 8.7 mg/dL — ABNORMAL LOW (ref 8.9–10.3)
Chloride: 106 mmol/L (ref 98–111)
Creatinine, Ser: 1.54 mg/dL — ABNORMAL HIGH (ref 0.44–1.00)
GFR, Estimated: 35 mL/min — ABNORMAL LOW (ref >60)
Glucose, Bld: 108 mg/dL — ABNORMAL HIGH (ref 70–99)
Potassium: 3.8 mmol/L (ref 3.5–5.1)
Sodium: 140 mmol/L (ref 135–145)

## 2022-08-15 LAB — CBC
HCT: 30.9 % — ABNORMAL LOW (ref 36.0–46.0)
Hemoglobin: 9.9 g/dL — ABNORMAL LOW (ref 12.0–15.0)
MCH: 28.3 pg (ref 26.0–34.0)
MCHC: 32 g/dL (ref 30.0–36.0)
MCV: 88.3 fL (ref 80.0–100.0)
Platelets: 215 10*3/uL (ref 150–400)
RBC: 3.5 MIL/uL — ABNORMAL LOW (ref 3.87–5.11)
RDW: 12.7 % (ref 11.5–15.5)
WBC: 10.4 10*3/uL (ref 4.0–10.5)
nRBC: 0 % (ref 0.0–0.2)

## 2022-08-15 LAB — GLUCOSE, CAPILLARY
Glucose-Capillary: 107 mg/dL — ABNORMAL HIGH (ref 70–99)
Glucose-Capillary: 150 mg/dL — ABNORMAL HIGH (ref 70–99)
Glucose-Capillary: 152 mg/dL — ABNORMAL HIGH (ref 70–99)
Glucose-Capillary: 77 mg/dL (ref 70–99)
Glucose-Capillary: 86 mg/dL (ref 70–99)

## 2022-08-15 MED ORDER — LEVOTHYROXINE SODIUM 100 MCG/5ML IV SOLN
50.0000 ug | Freq: Every day | INTRAVENOUS | Status: DC
  Administered 2022-08-16 – 2022-08-17 (×2): 50 ug via INTRAVENOUS
  Filled 2022-08-15 (×2): qty 5

## 2022-08-15 MED ORDER — LEVOTHYROXINE SODIUM 100 MCG/5ML IV SOLN: 44.0000 ug | Freq: Every day | INTRAVENOUS | Status: DC

## 2022-08-15 MED ORDER — DEXTROSE-NACL 5-0.9 % IV SOLN: INTRAVENOUS | Status: DC

## 2022-08-15 NOTE — Progress Notes (Signed)
Patient tolerated dinner. No nausea or vomiting.

## 2022-08-15 NOTE — Plan of Care (Signed)

## 2022-08-15 NOTE — Progress Notes (Signed)
   Subjective/Chief Complaint: Patient tolerated diet but has more pain this morning.  No BM last night or gas this morning.  No nausea or vomiting.   Objective: Vital signs in last 24 hours: Temp:  [98.4 F (36.9 C)-98.8 F (37.1 C)] 98.5 F (36.9 C) (12/31 0729) Pulse Rate:  [79-87] 87 (12/31 0729) Resp:  [16-17] 16 (12/31 0729) BP: (103-127)/(44-82) 117/44 (12/31 0729) SpO2:  [96 %-100 %] 100 % (12/31 0729) Weight:  [55.1 kg] 55.1 kg (12/30 1300) Last BM Date : 08/14/22  Intake/Output from previous day: 12/30 0701 - 12/31 0700 In: 2977.6 [I.V.:2977.6] Out: -  Intake/Output this shift: No intake/output data recorded.  GI: Soft with mild to moderate left lower quadrant abdominal pain.  No guarding or rigidity.  Lab Results:  Recent Labs    08/12/22 1808  WBC 19.1*  HGB 12.7  HCT 40.6  PLT 285   BMET Recent Labs    08/14/22 0220 08/15/22 0142  NA 142 140  K 3.6 3.8  CL 108 106  CO2 24 22  GLUCOSE 94 108*  BUN 20 12  CREATININE 1.53* 1.54*  CALCIUM 9.0 8.7*   PT/INR No results for input(s): "LABPROT", "INR" in the last 72 hours. ABG No results for input(s): "PHART", "HCO3" in the last 72 hours.  Invalid input(s): "PCO2", "PO2"  Studies/Results: DG Abd Portable 1V-Small Bowel Obstruction Protocol-initial, 8 hr delay  Result Date: 08/13/2022 CLINICAL DATA:  Small-bowel obstruction, 8 hour delay film. EXAM: PORTABLE ABDOMEN - 1 VIEW COMPARISON:  June 21, 2018 FINDINGS: The bowel gas pattern is normal. Radiopaque contrast is seen throughout the large bowel. No radio-opaque calculi or other significant radiographic abnormality are seen. IMPRESSION: Nonobstructive bowel gas pattern. Electronically Signed   By: Virgina Norfolk M.D.   On: 08/13/2022 19:20    Anti-infectives: Anti-infectives (From admission, onward)    None       Assessment/Plan: Partial small bowel obstruction-films reviewed from yesterday showed contrast in colon.  Diet was  advanced and she developed more abdominal pain.  Make n.p.o. except for ice chips for now.  Check KUB for now.  May need laparotomy in next 24 hours if she does not resolve with conservative measures.   LOS: 2 days   Total time 20 minutes Turner Daniels MD 08/15/2022

## 2022-08-16 DIAGNOSIS — K56609 Unspecified intestinal obstruction, unspecified as to partial versus complete obstruction: Secondary | ICD-10-CM | POA: Diagnosis not present

## 2022-08-16 LAB — BASIC METABOLIC PANEL
Anion gap: 10 (ref 5–15)
BUN: 5 mg/dL — ABNORMAL LOW (ref 8–23)
CO2: 27 mmol/L (ref 22–32)
Calcium: 8.9 mg/dL (ref 8.9–10.3)
Chloride: 104 mmol/L (ref 98–111)
Creatinine, Ser: 1.32 mg/dL — ABNORMAL HIGH (ref 0.44–1.00)
GFR, Estimated: 43 mL/min — ABNORMAL LOW (ref >60)
Glucose, Bld: 115 mg/dL — ABNORMAL HIGH (ref 70–99)
Potassium: 3.3 mmol/L — ABNORMAL LOW (ref 3.5–5.1)
Sodium: 141 mmol/L (ref 135–145)

## 2022-08-16 LAB — GLUCOSE, CAPILLARY
Glucose-Capillary: 116 mg/dL — ABNORMAL HIGH (ref 70–99)
Glucose-Capillary: 183 mg/dL — ABNORMAL HIGH (ref 70–99)
Glucose-Capillary: 135 mg/dL — ABNORMAL HIGH (ref 70–99)
Glucose-Capillary: 168 mg/dL — ABNORMAL HIGH (ref 70–99)

## 2022-08-16 MED ORDER — POTASSIUM CHLORIDE CRYS ER 20 MEQ PO TBCR
40.0000 meq | EXTENDED_RELEASE_TABLET | Freq: Once | ORAL | Status: AC
  Administered 2022-08-16: 40 meq via ORAL
  Filled 2022-08-16: qty 2

## 2022-08-16 NOTE — Progress Notes (Signed)
   Subjective/Chief Complaint: Feels better.  Lower abdominal pain is present but improved from yesterday.  She is hungry.  No nausea or vomiting.  Denies flatus or bowel movement.   Objective: Vital signs in last 24 hours: Temp:  [98.4 F (36.9 C)-98.6 F (37 C)] 98.6 F (37 C) (01/01 0630) Pulse Rate:  [78-82] 78 (01/01 0630) Resp:  [16] 16 (01/01 0630) BP: (124-143)/(46-76) 126/76 (01/01 0630) SpO2:  [98 %] 98 % (12/31 2141) Last BM Date : 08/14/22  Intake/Output from previous day: 12/31 0701 - 01/01 0700 In: 601.2 [I.V.:601.2] Out: -  Intake/Output this shift: No intake/output data recorded.  GI: Abdomen is flat, soft with mild left lower quadrant tenderness and lower midline incision.  No hernia.  No rebound or guarding.  Seems less tender than yesterday.  Lab Results:  Recent Labs    08/15/22 0919  WBC 10.4  HGB 9.9*  HCT 30.9*  PLT 215   BMET Recent Labs    08/15/22 0142 08/16/22 0203  NA 140 141  K 3.8 3.3*  CL 106 104  CO2 22 27  GLUCOSE 108* 115*  BUN 12 5*  CREATININE 1.54* 1.32*  CALCIUM 8.7* 8.9   PT/INR No results for input(s): "LABPROT", "INR" in the last 72 hours. ABG No results for input(s): "PHART", "HCO3" in the last 72 hours.  Invalid input(s): "PCO2", "PO2"  Studies/Results: DG Abd 1 View  Result Date: 08/15/2022 CLINICAL DATA:  SBO EXAM: ABDOMEN - 1 VIEW COMPARISON:  08/13/2022 FINDINGS: Unremarkable bowel gas pattern. Majority of the retained oral contrast observed in the colon on the prior study is no longer present. No gaseous distention of bowel identified. IMPRESSION: Negative. Electronically Signed   By: Sammie Bench M.D.   On: 08/15/2022 10:34    Anti-infectives: Anti-infectives (From admission, onward)    None       Assessment/Plan:   Partial small bowel obstruction-films reviewed from yesterday showed contrast in colon.   KUB yesterday showed resolution of small bowel obstruction.  Her pain is improved  being on n.p.o. status overnight.  Will start on clears again today to see if she can progress without pain over the next 24 hours.  If not, she will need laparotomy during this admission.   Total time 20 minutes   LOS: 3 days    Turner Daniels MD 08/16/2022

## 2022-08-16 NOTE — Plan of Care (Signed)

## 2022-08-16 NOTE — Progress Notes (Signed)
PROGRESS NOTE Sonia Skinner  TML:[id number] DOB: November 07, 1950 DOA: 08/12/2022 PCP: [name], MD   Brief Narrative/Hospital Course: 72 year old female with history of CVA, anxiety and depression, SBO, peritonitis, and hypertension who on the 27th she developed generalized abdominal pain.  The pain was progressive, described as sharp.  She endorses nausea, and vomiting.  She thought she was having a diverticular attack.  She has not taken any meds since 12/27 due to her pain and nausea.  She went to [facility]. At [facility] imaging was positive for small bowel obstruction.  Transfer requested.  History provided by the patient. Seen by general surgery for small bowel obstruction since no ongoing nausea and minimal distention no peritonitis although leukocytosis recommended small bowel protocol.   Subjective: Seen and examined this morning noted pain has resolved, starting on clear liquid diet  No nausea vomiting or flatus or bowel movement yet   Assessment and Plan: Principal Problem:   SBO (small bowel obstruction) (HCC) Active Problems:   Essential hypertension, benign   Type II diabetes mellitus with manifestations (HCC)   Hypothyroidism   Hyperlipidemia with target LDL less than 70   Major depressive disorder, recurrent episode (HCC)   Tobacco abuse   Cerebrovascular accident (CVA) due to stenosis of left middle cerebral artery (HCC)   Multinodular goiter   Chronic renal disease, stage 4, severely decreased glomerular filtration rate (GFR) between 15-29 mL/min/1.73 square meter (HCC)   Partial SBO: Small bowel follow-through showed contrast in the colon> appears to have resolved but patient had significant pain after starting diet, now pain has resolved, starting clear liquid diet and advancing diet slowly in next 24 hours, defer to general surgery.   AKI on CKD stage IV baseline creatinine around 1.9 to mid 2 point, significantly improved gentle IV fluids.   Recent Labs    11/26/21 1338 02/17/22 0437 02/25/22 1334 03/29/22 1114 06/01/22 1452 08/12/22 1808 08/13/22 1119 08/14/22 0220 08/15/22 0142 08/16/22 0203  BUN 39* 29* 27* 36* 23 45* 33* 20 12 5*  CREATININE 2.43* 1.98* 1.97* 2.14* 1.91* 2.63* 1.99* 1.53* 1.54* 1.32*    Essential hypertension, benign: BP soft to control orbitofrontal milligrams will hold since n.p.o.   Type II diabetes mellitus with manifestations: With hypoglycemia continue D5 ivf, monitor Recent Labs  Lab 08/15/22 0438 08/15/22 1144 08/15/22 1712 08/15/22 2340 08/16/22 0605  GLUCAP 77 150* 86 107* 116*     Hypothyroidism cont synthroid> change to iv   Hyperlipidemia with target LDL less than 70: hold Lipitor, Zetia   Major depressive disorder, recurrent episode: Currently mood stable, hold Paxil, Rexulti and Wellbutrin as able   h/o Cerebrovascular accident (CVA) due to stenosis of left middle cerebral artery: on asa. Off brilinta, also on lipitor  DVT prophylaxis: heparin injection 5,000 Units Start: 08/13/22 0600 SCDs Start: 08/13/22 0459 Code Status:   Code Status: Full Code Family Communication: plan of care discussed with patient at bedside. Patient status is: Inpatient because of bowel obstruction Level of care: Telemetry Surgical   Dispo: The patient is from: home with sister             Anticipated disposition: Home once tolerating diet in next 24 hrs Objective: Vitals last 24 hrs: Vitals:   08/15/22 1545 08/15/22 2141 08/16/22 0630 08/16/22 0900  BP: (!) 143/57 (!) 124/46 126/76 126/66  Pulse: 82 82 78 78  Resp: '16  16 17  '$ Temp: 98.5 F (36.9 C) 98.4 F (36.9 C) 98.6 F (  37 C) 98.1 F (36.7 C)  TempSrc: Oral Oral  Oral  SpO2: 98% 98%  100%  Weight:      Height:       Weight change:   Physical Examination: General exam: AAox3, weak,older appearing HEENT:Oral mucosa moist, Ear/Nose WNL grossly, dentition normal. Respiratory system: bilaterally clear BS,no use of accessory  muscle Cardiovascular system: S1 & S2 +, regular rate. Gastrointestinal system: Abdomen soft,NT,ND,BS+ Nervous System:Alert, awake, moving extremities and grossly nonfocal Extremities: LE ankle edema neg, lower extremities warm Skin: No rashes,no icterus. MSK: Normal muscle bulk,tone, power   Medications reviewed:  Scheduled Meds:  aspirin  81 mg Oral Daily   heparin  5,000 Units Subcutaneous Q8H   insulin aspart  0-15 Units Subcutaneous Q6H   insulin glargine-yfgn  10 Units Subcutaneous Daily   levothyroxine  50 mcg Intravenous Daily   pantoprazole (PROTONIX) IV  40 mg Intravenous Daily   potassium chloride  40 mEq Oral Once   Continuous Infusions:  dextrose 5 % and 0.9% NaCl 75 mL/hr at 08/16/22 7588      Diet Order             Diet clear liquid Room service appropriate? Yes; Fluid consistency: Pudding Thick  Diet effective now                  Intake/Output Summary (Last 24 hours) at 08/16/2022 0905 Last data filed at 08/15/2022 1402 Gross per 24 hour  Intake 601.24 ml  Output --  Net 601.24 ml    Net IO Since Admission: [place].17 mL [08/16/22 0905]  Wt Readings from Last 3 Encounters:  08/14/22 55.1 kg  07/28/22 55.8 kg  07/14/22 55.8 kg     Unresulted Labs (From admission, onward)     Start     Ordered   08/14/22 3254  Basic metabolic panel  Daily,   R      08/13/22 0947   08/13/22 0500  CBC with Differential/Platelet  Tomorrow morning,   R        08/13/22 0459   08/13/22 0459  CBC  (heparin)  Once,   R       Comments: Baseline for heparin therapy IF NOT ALREADY DRAWN.  Notify MD if PLT < 100 K.    08/13/22 0459          Data Reviewed: I have personally reviewed following labs and imaging studies CBC: Recent Labs  Lab 08/12/22 1808 08/15/22 0919  WBC 19.1* 10.4  HGB 12.7 9.9*  HCT 40.6 30.9*  MCV 89.0 88.3  PLT 285 982    Basic Metabolic Panel: Recent Labs  Lab 08/12/22 1808 08/13/22 1119 08/14/22 0220 08/15/22 0142 08/16/22 0203   NA 141 142 142 140 141  K 4.7 4.4 3.6 3.8 3.3*  CL 100 111 108 106 104  CO2 25 20* '24 22 27  '$ GLUCOSE 251* 119* 94 108* 115*  BUN 45* 33* 20 12 5*  CREATININE 2.63* 1.99* 1.53* 1.54* 1.32*  CALCIUM 10.1 8.7* 9.0 8.7* 8.9    GFR: Estimated Creatinine Clearance: 30 mL/min (A) (by C-G formula based on SCr of 1.32 mg/dL (H)). Liver Function Tests: Recent Labs  Lab 08/12/22 1808  AST 15  ALT 20  ALKPHOS 153*  BILITOT 0.5  PROT 7.9  ALBUMIN 4.5    Recent Labs  Lab 08/12/22 1808  LIPASE 27   BNP (last 3 results) Recent Labs    03/29/22 1114  PROBNP 5.0    No results  found for this or any previous visit (from the past 240 hour(s)).  Antimicrobials: Anti-infectives (From admission, onward)    None      Culture/Microbiology    Component Value Date/Time   SDES BLOOD LEFT HAND 12/11/2016 2120   SPECREQUEST  12/11/2016 2120    BOTTLES DRAWN AEROBIC ONLY Blood Culture adequate volume   CULT NO GROWTH 5 DAYS 12/11/2016 2120   REPTSTATUS 12/16/2016 FINAL 12/11/2016 2120   Other culture-see note  Radiology Studies:DG Abd 1 View  Result Date: 08/15/2022 CLINICAL DATA:  SBO EXAM: ABDOMEN - 1 VIEW COMPARISON:  08/13/2022 FINDINGS: Unremarkable bowel gas pattern. Majority of the retained oral contrast observed in the colon on the prior study is no longer present. No gaseous distention of bowel identified. IMPRESSION: Negative. Electronically Signed   By: Sammie Bench M.D.   On: 08/15/2022 10:34     LOS: 3 days   Antonieta Pert, MD [facility]  08/16/2022, 9:05 AM

## 2022-08-16 NOTE — Plan of Care (Signed)
Problem: Education: Goal: Knowledge of General Education information will improve Description: Including pain rating scale, medication(s)/side effects and non-pharmacologic comfort measures 08/16/2022 0750 by Particia Lather, RN Outcome: Progressing 08/16/2022 0748 by Particia Lather, RN Outcome: Progressing   Problem: Health Behavior/Discharge Planning: Goal: Ability to manage health-related needs will improve 08/16/2022 0750 by Particia Lather, RN Outcome: Progressing 08/16/2022 0748 by Particia Lather, RN Outcome: Progressing   Problem: Clinical Measurements: Goal: Ability to maintain clinical measurements within normal limits will improve 08/16/2022 0750 by Particia Lather, RN Outcome: Progressing 08/16/2022 0748 by Particia Lather, RN Outcome: Progressing Goal: Will remain free from infection 08/16/2022 0750 by Particia Lather, RN Outcome: Progressing 08/16/2022 0748 by Particia Lather, RN Outcome: Progressing Goal: Diagnostic test results will improve 08/16/2022 0750 by Particia Lather, RN Outcome: Progressing 08/16/2022 0748 by Particia Lather, RN Outcome: Progressing Goal: Respiratory complications will improve 08/16/2022 0750 by Particia Lather, RN Outcome: Progressing 08/16/2022 0748 by Particia Lather, RN Outcome: Progressing Goal: Cardiovascular complication will be avoided 08/16/2022 0750 by Particia Lather, RN Outcome: Progressing 08/16/2022 0748 by Particia Lather, RN Outcome: Progressing   Problem: Activity: Goal: Risk for activity intolerance will decrease 08/16/2022 0750 by Particia Lather, RN Outcome: Progressing 08/16/2022 0748 by Particia Lather, RN Outcome: Progressing   Problem: Nutrition: Goal: Adequate nutrition will be maintained 08/16/2022 0750 by Particia Lather, RN Outcome: Progressing 08/16/2022 0748 by Particia Lather, RN Outcome: Progressing   Problem: Coping: Goal: Level of anxiety will decrease 08/16/2022 0750 by Particia Lather, RN Outcome: Progressing 08/16/2022 0748 by Particia Lather, RN Outcome: Progressing   Problem: Elimination: Goal: Will not experience complications related to bowel motility 08/16/2022 0750 by Particia Lather, RN Outcome: Progressing 08/16/2022 0748 by Particia Lather, RN Outcome: Progressing Goal: Will not experience complications related to urinary retention 08/16/2022 0750 by Particia Lather, RN Outcome: Progressing 08/16/2022 0748 by Particia Lather, RN Outcome: Progressing   Problem: Pain Managment: Goal: General experience of comfort will improve 08/16/2022 0750 by Particia Lather, RN Outcome: Progressing 08/16/2022 0748 by Particia Lather, RN Outcome: Progressing   Problem: Safety: Goal: Ability to remain free from injury will improve 08/16/2022 0750 by Particia Lather, RN Outcome: Progressing 08/16/2022 0748 by Particia Lather, RN Outcome: Progressing   Problem: Skin Integrity: Goal: Risk for impaired skin integrity will decrease 08/16/2022 0750 by Particia Lather, RN Outcome: Progressing 08/16/2022 0748 by Particia Lather, RN Outcome: Progressing   Problem: Education: Goal: Ability to describe self-care measures that may prevent or decrease complications (Diabetes Survival Skills Education) will improve 08/16/2022 0750 by Particia Lather, RN Outcome: Progressing 08/16/2022 0748 by Particia Lather, RN Outcome: Progressing Goal: Individualized Educational Video(s) 08/16/2022 0750 by Particia Lather, RN Outcome: Progressing 08/16/2022 0748 by Particia Lather, RN Outcome: Progressing   Problem: Coping: Goal: Ability to adjust to condition or change in health will improve 08/16/2022 0750 by Particia Lather, RN Outcome: Progressing 08/16/2022 0748 by Particia Lather, RN Outcome: Progressing   Problem: Fluid Volume: Goal: Ability to maintain a balanced intake and output will improve 08/16/2022 0750 by Particia Lather, RN Outcome:  Progressing 08/16/2022 0748 by Particia Lather, RN Outcome: Progressing   Problem: Health Behavior/Discharge Planning: Goal: Ability to identify and utilize available resources and services will improve 08/16/2022 0750 by Particia Lather, RN Outcome: Progressing 08/16/2022 0748 by Particia Lather,  RN Outcome: Progressing Goal: Ability to manage health-related needs will improve 08/16/2022 0750 by Particia Lather, RN Outcome: Progressing 08/16/2022 0748 by Particia Lather, RN Outcome: Progressing   Problem: Metabolic: Goal: Ability to maintain appropriate glucose levels will improve 08/16/2022 0750 by Particia Lather, RN Outcome: Progressing 08/16/2022 0748 by Particia Lather, RN Outcome: Progressing   Problem: Nutritional: Goal: Maintenance of adequate nutrition will improve 08/16/2022 0750 by Particia Lather, RN Outcome: Progressing 08/16/2022 0748 by Particia Lather, RN Outcome: Progressing Goal: Progress toward achieving an optimal weight will improve 08/16/2022 0750 by Particia Lather, RN Outcome: Progressing 08/16/2022 0748 by Particia Lather, RN Outcome: Progressing   Problem: Skin Integrity: Goal: Risk for impaired skin integrity will decrease 08/16/2022 0750 by Particia Lather, RN Outcome: Progressing 08/16/2022 0748 by Particia Lather, RN Outcome: Progressing   Problem: Tissue Perfusion: Goal: Adequacy of tissue perfusion will improve 08/16/2022 0750 by Particia Lather, RN Outcome: Progressing 08/16/2022 0748 by Particia Lather, RN Outcome: Progressing

## 2022-08-17 DIAGNOSIS — I10 Essential (primary) hypertension: Secondary | ICD-10-CM | POA: Diagnosis not present

## 2022-08-17 DIAGNOSIS — K56609 Unspecified intestinal obstruction, unspecified as to partial versus complete obstruction: Secondary | ICD-10-CM | POA: Diagnosis not present

## 2022-08-17 LAB — GLUCOSE, CAPILLARY
Glucose-Capillary: 144 mg/dL — ABNORMAL HIGH (ref 70–99)
Glucose-Capillary: 162 mg/dL — ABNORMAL HIGH (ref 70–99)
Glucose-Capillary: 174 mg/dL — ABNORMAL HIGH (ref 70–99)
Glucose-Capillary: 177 mg/dL — ABNORMAL HIGH (ref 70–99)
Glucose-Capillary: 187 mg/dL — ABNORMAL HIGH (ref 70–99)

## 2022-08-17 LAB — BASIC METABOLIC PANEL
Anion gap: 10 (ref 5–15)
BUN: <5 mg/dL — ABNORMAL LOW (ref 8–23)
CO2: 22 mmol/L (ref 22–32)
Calcium: 8.7 mg/dL — ABNORMAL LOW (ref 8.9–10.3)
Chloride: 107 mmol/L (ref 98–111)
Creatinine, Ser: 1.16 mg/dL — ABNORMAL HIGH (ref 0.44–1.00)
GFR, Estimated: 50 mL/min — ABNORMAL LOW (ref >60)
Glucose, Bld: 156 mg/dL — ABNORMAL HIGH (ref 70–99)
Potassium: 4.3 mmol/L (ref 3.5–5.1)
Sodium: 139 mmol/L (ref 135–145)

## 2022-08-17 MED ORDER — PANTOPRAZOLE SODIUM 40 MG PO TBEC
40.0000 mg | DELAYED_RELEASE_TABLET | Freq: Every day | ORAL | Status: DC
  Administered 2022-08-18: 40 mg via ORAL
  Filled 2022-08-17: qty 1

## 2022-08-17 MED ORDER — BREXPIPRAZOLE 1 MG PO TABS
1.0000 mg | ORAL_TABLET | Freq: Every day | ORAL | Status: DC
  Administered 2022-08-17 – 2022-08-18 (×2): 1 mg via ORAL
  Filled 2022-08-17 (×2): qty 1

## 2022-08-17 MED ORDER — OXYCODONE HCL 5 MG PO TABS
5.0000 mg | ORAL_TABLET | ORAL | Status: DC | PRN
  Administered 2022-08-17: 5 mg via ORAL
  Filled 2022-08-17: qty 1

## 2022-08-17 MED ORDER — BUPROPION HCL ER (XL) 150 MG PO TB24
300.0000 mg | ORAL_TABLET | Freq: Every day | ORAL | Status: DC
  Administered 2022-08-18: 300 mg via ORAL
  Filled 2022-08-17: qty 2

## 2022-08-17 MED ORDER — POLYETHYLENE GLYCOL 3350 17 G PO PACK
17.0000 g | PACK | Freq: Every day | ORAL | Status: DC
  Filled 2022-08-17 (×2): qty 1

## 2022-08-17 MED ORDER — INSULIN ASPART 100 UNIT/ML IJ SOLN: 0.0000 [IU] | Freq: Every day | INTRAMUSCULAR | Status: DC

## 2022-08-17 MED ORDER — ONDANSETRON 4 MG PO TBDP: 4.0000 mg | ORAL_TABLET | Freq: Three times a day (TID) | ORAL | Status: DC | PRN

## 2022-08-17 MED ORDER — INSULIN ASPART 100 UNIT/ML IJ SOLN: 0.0000 [IU] | Freq: Three times a day (TID) | INTRAMUSCULAR | Status: DC

## 2022-08-17 MED ORDER — DOCUSATE SODIUM 100 MG PO CAPS
100.0000 mg | ORAL_CAPSULE | Freq: Two times a day (BID) | ORAL | Status: DC
  Administered 2022-08-17 – 2022-08-18 (×3): 100 mg via ORAL
  Filled 2022-08-17 (×3): qty 1

## 2022-08-17 MED ORDER — PAROXETINE HCL 20 MG PO TABS
20.0000 mg | ORAL_TABLET | Freq: Every day | ORAL | Status: DC
  Administered 2022-08-18: 20 mg via ORAL
  Filled 2022-08-17: qty 1

## 2022-08-17 MED ORDER — INSULIN ASPART 100 UNIT/ML IJ SOLN
0.0000 [IU] | Freq: Three times a day (TID) | INTRAMUSCULAR | Status: DC
  Administered 2022-08-18: 3 [IU] via SUBCUTANEOUS

## 2022-08-17 MED ORDER — LEVOTHYROXINE SODIUM 88 MCG PO TABS
88.0000 ug | ORAL_TABLET | Freq: Every day | ORAL | Status: DC
  Administered 2022-08-18: 88 ug via ORAL
  Filled 2022-08-17: qty 1

## 2022-08-17 MED ORDER — AMLODIPINE BESYLATE 10 MG PO TABS
10.0000 mg | ORAL_TABLET | Freq: Every day | ORAL | Status: DC
  Administered 2022-08-17 – 2022-08-18 (×2): 10 mg via ORAL
  Filled 2022-08-17 (×2): qty 1

## 2022-08-17 NOTE — Progress Notes (Signed)
   Subjective/Chief Complaint: Feels better.  Reports mild soreness with abd palpation but denies significant abd pain. Mild nausea last night. Reports a BM yesterday described as multiple small hard stool balls. Denies significant flatus.    Objective: Vital signs in last 24 hours: Temp:  [98 F (36.7 C)-98.5 F (36.9 C)] 98 F (36.7 C) (01/02 0754) Pulse Rate:  [76-98] 76 (01/02 0754) Resp:  [17-18] 18 (01/02 0754) BP: (126-151)/(61-66) 135/63 (01/02 0754) SpO2:  [92 %-100 %] 100 % (01/02 0754) Last BM Date : 08/16/22  Intake/Output from previous day: 01/01 0701 - 01/02 0700 In: 3074.7 [P.O.:1440; I.V.:1634.7] Out: -  Intake/Output this shift: No intake/output data recorded.  GI: Abdomen is flat, soft with mild LUQ/left mid abd tenderness. No hernia.  No rebound or guarding.    Lab Results:  Recent Labs    08/15/22 0919  WBC 10.4  HGB 9.9*  HCT 30.9*  PLT 215   BMET Recent Labs    08/16/22 0203 08/17/22 0311  NA 141 139  K 3.3* 4.3  CL 104 107  CO2 27 22  GLUCOSE 115* 156*  BUN 5* <5*  CREATININE 1.32* 1.16*  CALCIUM 8.9 8.7*   PT/INR No results for input(s): "LABPROT", "INR" in the last 72 hours. ABG No results for input(s): "PHART", "HCO3" in the last 72 hours.  Invalid input(s): "PCO2", "PO2"  Studies/Results: DG Abd 1 View  Result Date: 08/15/2022 CLINICAL DATA:  SBO EXAM: ABDOMEN - 1 VIEW COMPARISON:  08/13/2022 FINDINGS: Unremarkable bowel gas pattern. Majority of the retained oral contrast observed in the colon on the prior study is no longer present. No gaseous distention of bowel identified. IMPRESSION: Negative. Electronically Signed   By: Sammie Bench M.D.   On: 08/15/2022 10:34    Anti-infectives: Anti-infectives (From admission, onward)    None       Assessment/Plan:   Partial small bowel obstruction-films reviewed from 12/31 show contrast in colon and non-obstructive bowel gas pattern   Patient clinically improving with  less abd pain and nausea. +BM yesterday. Advance to FLD and then SOFT as tolerated. If she tolerates diet advancement without nausea, vomiting, or recurrence of abd pain/distention then I think she could go home as early as this afternoon/evening. If symptoms recur then she may need surgery this admission.    Total time 20 minutes   LOS: 4 days     Obie Dredge, PA-C  08/17/2022

## 2022-08-17 NOTE — Progress Notes (Signed)
Progress Note    Sonia Skinner   EXN:[id number]  DOB: 11/11/1950  DOA: 08/12/2022     4 PCP: [name], MD  Initial CC: abdominal pain  Hospital Course: [name] is a 72 yo female with PMH CVA, anxiety/depression, SBO, peritonitis, HTN who presented with abdominal pain.  Her pain was similar to prior episode of her SBO. CT abdomen/pelvis on admission showed small bowel obstruction with transition zone in the left lower quadrant and small bowel dilation to 4 cm.  She was admitted and general surgery was consulted. She was placed on bowel rest and underwent small bowel follow-through. She clinically improved slowly and diet was able to be reintroduced and slowly advanced.  Interval History:  No events overnight.  Patient seen resting in bed comfortably this morning and has been tolerating liquid diet.  Denied any nausea or vomiting.  Not having much flatus but passing some stools described as "pellets".  She has been nervous to take all her laxatives due to thinking that will make her gaseous distention worse.  Assessment and Plan:  Partial SBO - Noted on CT abdomen/pelvis on admission with transition zone in the LLQ and small bowel dilated to 4 cm - General surgery has been following, appreciate assistance - Patient started on bowel rest initially with slow advancement after small bowel follow-through initiated -Contrast has been moving through - Patient tolerating full liquids this morning; diet advanced to soft diet later this afternoon - If continues to tolerate, probable discharge home tomorrow   AKI on CKD stage IV baseline creatinine around 1.9 - 2 -Creatinine 2.63 on admission, suspected prerenal from hypovolemia - Responded well to fluids - Repeat BMP in a.m.  Essential hypertension - starting to trend up - resume amlodipine    DMII - continue semglee and SSI and CBG monitoring    Hypothyroidism cont synthroid   Hyperlipidemia with target LDL less than 70: hold  Lipitor, Zetia   Major depressive disorder -Resume brexpiprazole, Paxil, Wellbutrin   h/o Cerebrovascular accident (CVA) -No longer on Brilinta - Continue aspirin - Resume statin at discharge   Old records reviewed in assessment of this patient  Antimicrobials:   DVT prophylaxis:  heparin injection 5,000 Units Start: 08/13/22 0600 SCDs Start: 08/13/22 0459   Code Status:   Code Status: Full Code  Mobility Assessment (last 72 hours)     Mobility Assessment     Row Name 08/17/22 1029 08/16/22 1030 08/15/22 0845       Does patient have an order for bedrest or is patient medically unstable No - Continue assessment No - Continue assessment No - Continue assessment     What is the highest level of mobility based on the progressive mobility assessment? Level 6 (Walks independently in room and hall) - Balance while walking in room without assist - Complete Level 6 (Walks independently in room and hall) - Balance while walking in room without assist - Complete Level 4 (Walks with assist in room) - Balance while marching in place and cannot step forward and back - Complete              Barriers to discharge:  Disposition Plan:  Home Status is: Inpt  Objective: Blood pressure (!) 152/62, pulse 90, temperature 97.8 F (36.6 C), temperature source Oral, resp. rate 17, height '5\' 2"'$  (1.575 m), weight 55.1 kg, SpO2 100 %.  Examination:  Physical Exam Constitutional:      Appearance: She is well-developed.  HENT:  Head: Normocephalic and atraumatic.     Mouth/Throat:     Mouth: Mucous membranes are moist.  Eyes:     Extraocular Movements: Extraocular movements intact.  Cardiovascular:     Rate and Rhythm: Normal rate and regular rhythm.  Pulmonary:     Effort: Pulmonary effort is normal.     Breath sounds: Normal breath sounds.  Abdominal:     General: Bowel sounds are normal. There is no distension.     Palpations: Abdomen is soft.     Tenderness: There is no  abdominal tenderness.  Musculoskeletal:        General: Normal range of motion.     Cervical back: Normal range of motion and neck supple.  Skin:    General: Skin is warm and dry.  Neurological:     General: No focal deficit present.     Mental Status: She is alert and oriented to person, place, and time.  Psychiatric:        Mood and Affect: Mood normal.        Behavior: Behavior normal.      Consultants:  General surgery  Procedures:    Data Reviewed: Results for orders placed or performed during the hospital encounter of 08/12/22 (from the past 24 hour(s))  Glucose, capillary     Status: Abnormal   Collection Time: 08/16/22  7:49 PM  Result Value Ref Range   Glucose-Capillary 168 (H) 70 - 99 mg/dL  Glucose, capillary     Status: Abnormal   Collection Time: 08/17/22 12:43 AM  Result Value Ref Range   Glucose-Capillary 177 (H) 70 - 99 mg/dL  Basic metabolic panel     Status: Abnormal   Collection Time: 08/17/22  3:11 AM  Result Value Ref Range   Sodium 139 135 - 145 mmol/L   Potassium 4.3 3.5 - 5.1 mmol/L   Chloride 107 98 - 111 mmol/L   CO2 22 22 - 32 mmol/L   Glucose, Bld 156 (H) 70 - 99 mg/dL   BUN <5 (L) 8 - 23 mg/dL   Creatinine, Ser 1.16 (H) 0.44 - 1.00 mg/dL   Calcium 8.7 (L) 8.9 - 10.3 mg/dL   GFR, Estimated 50 (L) >60 mL/min   Anion gap 10 5 - 15  Glucose, capillary     Status: Abnormal   Collection Time: 08/17/22  5:04 AM  Result Value Ref Range   Glucose-Capillary 162 (H) 70 - 99 mg/dL  Glucose, capillary     Status: Abnormal   Collection Time: 08/17/22 12:02 PM  Result Value Ref Range   Glucose-Capillary 187 (H) 70 - 99 mg/dL  Glucose, capillary     Status: Abnormal   Collection Time: 08/17/22  5:09 PM  Result Value Ref Range   Glucose-Capillary 144 (H) 70 - 99 mg/dL    I have Reviewed nursing notes, Vitals, and Lab results since pt's last encounter. Pertinent lab results : see above I have ordered labwork to follow up on.  I have reviewed the  last note from staff over past 24 hours I have discussed pt's care plan and test results with nursing staff, CM/SW, and other staff as appropriate  Time spent: Greater than 50% of the 55 minute visit was spent in counseling/coordination of care for the patient as laid out in the A&P.   LOS: 4 days   Dwyane Dee, MD [facility] 08/17/2022, 6:40 PM

## 2022-08-17 NOTE — Plan of Care (Signed)

## 2022-08-17 NOTE — Care Management Important Message (Signed)
Important Message  Patient Details  Name: Sonia Skinner MRN: [id number] Date of Birth: 30-Oct-1950   Medicare Important Message Given:  Yes     Hannah Beat 08/17/2022, 11:45 AM

## 2022-08-18 DIAGNOSIS — K56609 Unspecified intestinal obstruction, unspecified as to partial versus complete obstruction: Secondary | ICD-10-CM | POA: Diagnosis not present

## 2022-08-18 DIAGNOSIS — I10 Essential (primary) hypertension: Secondary | ICD-10-CM | POA: Diagnosis not present

## 2022-08-18 LAB — GLUCOSE, CAPILLARY
Glucose-Capillary: 155 mg/dL — ABNORMAL HIGH (ref 70–99)
Glucose-Capillary: 110 mg/dL — ABNORMAL HIGH (ref 70–99)

## 2022-08-18 LAB — BASIC METABOLIC PANEL
Anion gap: 11 (ref 5–15)
BUN: <5 mg/dL — ABNORMAL LOW (ref 8–23)
CO2: 25 mmol/L (ref 22–32)
Calcium: 8.6 mg/dL — ABNORMAL LOW (ref 8.9–10.3)
Chloride: 104 mmol/L (ref 98–111)
Creatinine, Ser: 1.34 mg/dL — ABNORMAL HIGH (ref 0.44–1.00)
GFR, Estimated: 42 mL/min — ABNORMAL LOW (ref >60)
Glucose, Bld: 168 mg/dL — ABNORMAL HIGH (ref 70–99)
Potassium: 4.6 mmol/L (ref 3.5–5.1)
Sodium: 140 mmol/L (ref 135–145)

## 2022-08-18 LAB — MAGNESIUM: Magnesium: 1.1 mg/dL — ABNORMAL LOW (ref 1.7–2.4)

## 2022-08-18 MED ORDER — MAGNESIUM SULFATE 4 GM/100ML IV SOLN
4.0000 g | Freq: Once | INTRAVENOUS | Status: DC
  Filled 2022-08-18: qty 100

## 2022-08-18 NOTE — Plan of Care (Signed)
Per MD order, discharged patient.  Patient did not have IV and MD ok for discharge without IV Mag.  Discussed patient meds, follow up appts and managing glucose.  Patient verbalized understanding, denies needs and is wheeled by volunteer downstairs to family.  No further needs voiced. NADN. Sreece, RN

## 2022-08-18 NOTE — Progress Notes (Signed)
   Subjective/Chief Complaint: Continues to feel better. No abdominal pain. Ongoing bowel movements and flatus. Tolerated fulls yesterday without issue   Objective: Vital signs in last 24 hours: Temp:  [97.8 F (36.6 C)-99.1 F (37.3 C)] 99.1 F (37.3 C) (01/03 0731) Pulse Rate:  [83-99] 90 (01/03 0731) Resp:  [16-17] 16 (01/03 0731) BP: (109-152)/(41-62) 109/56 (01/03 0731) SpO2:  [95 %-100 %] 100 % (01/03 0731) Last BM Date : 08/16/22  Intake/Output from previous day: 01/02 0701 - 01/03 0700 In: 648.9 [P.O.:380; I.V.:268.9] Out: -  Intake/Output this shift: No intake/output data recorded.  GI: Abdomen is nondistended, soft, nontender   Lab Results:  Recent Labs    08/15/22 0919  WBC 10.4  HGB 9.9*  HCT 30.9*  PLT 215    BMET Recent Labs    08/17/22 0311 08/18/22 0214  NA 139 140  K 4.3 4.6  CL 107 104  CO2 22 25  GLUCOSE 156* 168*  BUN <5* <5*  CREATININE 1.16* 1.34*  CALCIUM 8.7* 8.6*    PT/INR No results for input(s): "LABPROT", "INR" in the last 72 hours. ABG No results for input(s): "PHART", "HCO3" in the last 72 hours.  Invalid input(s): "PCO2", "PO2"  Studies/Results: No results found.  Anti-infectives: Anti-infectives (From admission, onward)    None       Assessment/Plan:   Partial small bowel obstruction-films reviewed from 12/31 show contrast in colon and non-obstructive bowel gas pattern   Patient clinically improving with resolution of abd pain and nausea. +Bms and flatus. Tolerating PO.   OK for discharge from surgery standpoint. Went over recommended low residue diet with patient.      LOS: 5 days    Clovis Riley MD FACS 08/18/2022

## 2022-08-18 NOTE — Progress Notes (Signed)
Mobility Specialist - Progress Note   08/18/22 1000  Mobility  Activity Refused mobility    Pt stated she walked the halls yesterday and did not want to get up to walk at this time. Left in bed w/ all needs met.   [place] Specialist Please contact via SecureChat or Rehab office at [phone]

## 2022-08-18 NOTE — Discharge Summary (Signed)
Physician Discharge Summary   HIRA TRENT NLG:[id number] DOB: September 19, 1950 DOA: 08/12/2022  PCP: [name], MD  Admit date: 08/12/2022 Discharge date: 08/18/2022  Barriers to discharge: none  Admitted From: Home Disposition:  Home Discharging physician: Dwyane Dee, MD  Recommendations for Outpatient Follow-up:  Resume chronic medical management   Home Health:  Equipment/Devices:   Discharge Condition: stable CODE STATUS: Full Diet recommendation:  Diet Orders (From admission, onward)     Start     Ordered   08/17/22 1539  DIET SOFT Room service appropriate? Yes; Fluid consistency: Thin  Diet effective now       Question Answer Comment  Room service appropriate? Yes   Fluid consistency: Thin      08/17/22 1538            Hospital Course: [name] is a 72 yo female with PMH CVA, anxiety/depression, SBO, peritonitis, HTN who presented with abdominal pain.  Her pain was similar to prior episode of her SBO. CT abdomen/pelvis on admission showed small bowel obstruction with transition zone in the left lower quadrant and small bowel dilation to 4 cm.  She was admitted and general surgery was consulted. She was placed on bowel rest and underwent small bowel follow-through. She clinically improved slowly and diet was able to be reintroduced and slowly advanced.  Assessment and Plan:  Partial SBO - Noted on CT abdomen/pelvis on admission with transition zone in the LLQ and small bowel dilated to 4 cm - General surgery has been following, appreciate assistance - Patient started on bowel rest initially with slow advancement after small bowel follow-through initiated -Contrast has been moving through -Patient tolerated full liquids with advancement to soft diet -Patient had return of bowel movements prior to discharge   AKI on CKD stage IV baseline creatinine around 1.9 - 2 -Creatinine 2.63 on admission, suspected prerenal from hypovolemia - Responded well to fluids    Essential hypertension -Home regimen resumed at discharge   DMII -Continue home regimen   Hypothyroidism cont synthroid   Hyperlipidemia -Continue home regimen   Major depressive disorder -Resume brexpiprazole, Paxil, Wellbutrin   h/o Cerebrovascular accident (CVA) -No longer on Brilinta - Continue aspirin - Resume statin at discharge    The patient's chronic medical conditions were treated accordingly per the patient's home medication regimen except as noted.  On day of discharge, patient was felt deemed stable for discharge. Patient/family member advised to call PCP or come back to ER if needed.   Principal Diagnosis: SBO (small bowel obstruction) ([place])  Discharge Diagnoses: Active Hospital Problems   Diagnosis Date Noted   SBO (small bowel obstruction) ([place]) 08/12/2022    Priority: 1.   Chronic renal disease, stage 4, severely decreased glomerular filtration rate (GFR) between 15-29 mL/min/1.73 square meter ([place]) 06/08/2022   Multinodular goiter 11/02/2021   Major depressive disorder, recurrent episode ([place]) 02/20/2016   Cerebrovascular accident (CVA) due to stenosis of left middle cerebral artery ([place]) 11/02/2013   Tobacco abuse 07/31/2013   Essential hypertension, benign 04/20/2013   Type II diabetes mellitus with manifestations ([place]) 04/20/2013   Hypothyroidism 04/20/2013   Hyperlipidemia with target LDL less than 70 04/20/2013    Resolved Hospital Problems  No resolved problems to display.     Discharge Instructions     Increase activity slowly   Complete by: As directed       Allergies as of 08/18/2022       Reactions   Anesthetics, Ester Anaphylaxis  Medication List     STOP taking these medications    Brilinta 90 MG Tabs tablet Generic drug: ticagrelor       TAKE these medications    Accu-Chek FastClix Lancets Misc Use to check blood sugar five times daily.   Accu-Chek Guide test strip Generic drug: glucose blood    acetaminophen 500 MG tablet Commonly known as: TYLENOL Take 1,000-1,500 mg by mouth every 6 (six) hours as needed for mild pain.   amLODipine 10 MG tablet Commonly known as: NORVASC Take 1 tablet (10 mg total) by mouth daily.   aspirin 81 MG chewable tablet Chew 1 tablet (81 mg total) by mouth daily.   atorvastatin 40 MG tablet Commonly known as: LIPITOR Take 40 mg by mouth daily. What changed: Another medication with the same name was removed. Continue taking this medication, and follow the directions you see here.   brexpiprazole 1 MG Tabs tablet Commonly known as: Rexulti Take 1 tablet (1 mg total) by mouth daily.   Rexulti 0.5 MG Tabs Generic drug: Brexpiprazole Take 1 tablet by mouth daily.   buPROPion 300 MG 24 hr tablet Commonly known as: WELLBUTRIN XL Take 1 tablet (300 mg total) by mouth daily.   dapagliflozin propanediol 10 MG Tabs tablet Commonly known as: Farxiga Take 1 tablet (10 mg total) by mouth daily before breakfast.   Dexcom G7 Receiver Devi 1 Act by Does not apply route daily.   Dexcom G7 Sensor Misc 1 Act by Does not apply route daily.   Dexcom G7 Sensor Misc 1 Act by Does not apply route daily.   Dexcom G7 Sensor Misc USE AS DIRECTED TOPICALLY  EVERY  10  DAYS   dexlansoprazole 60 MG capsule Commonly known as: DEXILANT Take 1 capsule by mouth daily.   Edarbyclor 40-12.5 MG Tabs Generic drug: Azilsartan-Chlorthalidone Take 1 tablet by mouth daily.   ezetimibe 10 MG tablet Commonly known as: ZETIA Take 1 tablet by mouth once daily.   gabapentin 100 MG capsule Commonly known as: NEURONTIN Take 1 capsule (100 mg total) by mouth 3 (three) times daily.   HumaLOG KwikPen 200 UNIT/ML KwikPen Generic drug: insulin lispro Inject 5 units under the skin three times daily with meals. What changed: See the new instructions.   HYDROcodone-acetaminophen 5-325 MG tablet Commonly known as: Norco Take 1 tablet by mouth every 6 (six) hours as  needed for moderate pain.   levothyroxine 88 MCG tablet Commonly known as: SYNTHROID Take 1 tablet (88 mcg total) by mouth daily.   loperamide 2 MG capsule Commonly known as: IMODIUM Take 1 capsule (2 mg total) by mouth 4 (four) times daily as needed for diarrhea or loose stools.   loratadine 10 MG tablet Commonly known as: CLARITIN Take 1 tablet (10 mg total) by mouth daily.   PARoxetine 20 MG tablet Commonly known as: PAXIL Take 1 tablet (20 mg total) by mouth daily.   Semaglutide (2 MG/DOSE) 8 MG/3ML Sopn Inject 2 mg as directed once a week.   Toujeo SoloStar 300 UNIT/ML Solostar Pen Generic drug: insulin glargine (1 Unit Dial) Inject 20-22 Units into the skin daily. What changed: additional instructions   VITAMIN D3 PO Take 1 tablet by mouth daily. Unsure of dose        Follow-up Information     [name], MD Follow up in 1 week(s).   Specialty: Internal Medicine Contact information: [place] [address] [phone]  Allergies  Allergen Reactions   Anesthetics, Ester Anaphylaxis    Consultations: General surgery  Procedures:   Discharge Exam: BP (!) 109/56 (BP Location: Left Arm)   Pulse 90   Temp 99.1 F (37.3 C) (Oral)   Resp 16   Ht '5\' 2"'$  (1.575 m)   Wt 55.1 kg   SpO2 100%   BMI 22.22 kg/m  Physical Exam Constitutional:      Appearance: She is well-developed.  HENT:     Head: Normocephalic and atraumatic.     Mouth/Throat:     Mouth: Mucous membranes are moist.  Eyes:     Extraocular Movements: Extraocular movements intact.  Cardiovascular:     Rate and Rhythm: Normal rate and regular rhythm.  Pulmonary:     Effort: Pulmonary effort is normal.     Breath sounds: Normal breath sounds.  Abdominal:     General: Bowel sounds are normal. There is no distension.     Palpations: Abdomen is soft.     Tenderness: There is no abdominal tenderness.  Musculoskeletal:        General: Normal  range of motion.     Cervical back: Normal range of motion and neck supple.  Skin:    General: Skin is warm and dry.  Neurological:     General: No focal deficit present.     Mental Status: She is alert and oriented to person, place, and time.  Psychiatric:        Mood and Affect: Mood normal.        Behavior: Behavior normal.      The results of significant diagnostics from this hospitalization (including imaging, microbiology, ancillary and laboratory) are listed below for reference.   Microbiology: No results found for this or any previous visit (from the past 240 hour(s)).   Labs: BNP (last 3 results) Recent Labs    11/19/21 0619 11/20/21 0305  BNP 104.1* 259.5*   Basic Metabolic Panel: Recent Labs  Lab 08/14/22 0220 08/15/22 0142 08/16/22 0203 08/17/22 0311 08/18/22 0214  NA 142 140 141 139 140  K 3.6 3.8 3.3* 4.3 4.6  CL 108 106 104 107 104  CO2 '24 22 27 22 25  '$ GLUCOSE 94 108* 115* 156* 168*  BUN 20 12 5* <5* <5*  CREATININE 1.53* 1.54* 1.32* 1.16* 1.34*  CALCIUM 9.0 8.7* 8.9 8.7* 8.6*  MG  --   --   --   --  1.1*   Liver Function Tests: Recent Labs  Lab 08/12/22 1808  AST 15  ALT 20  ALKPHOS 153*  BILITOT 0.5  PROT 7.9  ALBUMIN 4.5   Recent Labs  Lab 08/12/22 1808  LIPASE 27   No results for input(s): "AMMONIA" in the last 168 hours. CBC: Recent Labs  Lab 08/12/22 1808 08/15/22 0919  WBC 19.1* 10.4  HGB 12.7 9.9*  HCT 40.6 30.9*  MCV 89.0 88.3  PLT 285 215   Cardiac Enzymes: No results for input(s): "CKTOTAL", "CKMB", "CKMBINDEX", "TROPONINI" in the last 168 hours. BNP: Invalid input(s): "POCBNP" CBG: Recent Labs  Lab 08/17/22 1202 08/17/22 1709 08/17/22 2023 08/18/22 0754 08/18/22 1126  GLUCAP 187* 144* 174* 155* 110*   D-Dimer No results for input(s): "DDIMER" in the last 72 hours. Hgb A1c No results for input(s): "HGBA1C" in the last 72 hours. Lipid Profile No results for input(s): "CHOL", "HDL", "LDLCALC", "TRIG",  "CHOLHDL", "LDLDIRECT" in the last 72 hours. Thyroid function studies No results for input(s): "TSH", "T4TOTAL", "T3FREE", "THYROIDAB" in  the last 72 hours.  Invalid input(s): "FREET3" Anemia work up No results for input(s): "VITAMINB12", "FOLATE", "FERRITIN", "TIBC", "IRON", "RETICCTPCT" in the last 72 hours. Urinalysis    Component Value Date/Time   COLORURINE YELLOW 08/12/2022 1808   APPEARANCEUR CLEAR 08/12/2022 1808   LABSPEC 1.019 08/12/2022 1808   PHURINE 5.5 08/12/2022 1808   GLUCOSEU >1,000 (A) 08/12/2022 1808   GLUCOSEU >=1000 (A) 03/29/2022 1114   HGBUR NEGATIVE 08/12/2022 1808   BILIRUBINUR NEGATIVE 08/12/2022 1808   KETONESUR NEGATIVE 08/12/2022 1808   PROTEINUR NEGATIVE 08/12/2022 1808   UROBILINOGEN 0.2 03/29/2022 1114   NITRITE NEGATIVE 08/12/2022 1808   LEUKOCYTESUR SMALL (A) 08/12/2022 1808   Sepsis Labs Recent Labs  Lab 08/12/22 1808 08/15/22 0919  WBC 19.1* 10.4   Microbiology No results found for this or any previous visit (from the past 240 hour(s)).  Procedures/Studies: DG Abd 1 View  Result Date: 08/15/2022 CLINICAL DATA:  SBO EXAM: ABDOMEN - 1 VIEW COMPARISON:  08/13/2022 FINDINGS: Unremarkable bowel gas pattern. Majority of the retained oral contrast observed in the colon on the prior study is no longer present. No gaseous distention of bowel identified. IMPRESSION: Negative. Electronically Signed   By: Sammie Bench M.D.   On: 08/15/2022 10:34   DG Abd Portable 1V-Small Bowel Obstruction Protocol-initial, 8 hr delay  Result Date: 08/13/2022 CLINICAL DATA:  Small-bowel obstruction, 8 hour delay film. EXAM: PORTABLE ABDOMEN - 1 VIEW COMPARISON:  June 21, 2018 FINDINGS: The bowel gas pattern is normal. Radiopaque contrast is seen throughout the large bowel. No radio-opaque calculi or other significant radiographic abnormality are seen. IMPRESSION: Nonobstructive bowel gas pattern. Electronically Signed   By: Virgina Norfolk M.D.   On:  08/13/2022 19:20   CT ABDOMEN PELVIS WO CONTRAST  Result Date: 08/12/2022 CLINICAL DATA:  Abdominal pain. EXAM: CT ABDOMEN AND PELVIS WITHOUT CONTRAST TECHNIQUE: Multidetector CT imaging of the abdomen and pelvis was performed following the standard protocol without IV contrast. RADIATION DOSE REDUCTION: This exam was performed according to the departmental dose-optimization program which includes automated exposure control, adjustment of the mA and/or kV according to patient size and/or use of iterative reconstruction technique. COMPARISON:  CT without contrast 02/17/2022, CT with contrast 08/17/2009. FINDINGS: Lower chest: Lung bases are mildly emphysematous but clear. The cardiac size is normal. A small anterior pericardial effusion is new from prior studies. There calcifications of the mitral ring and coronary arteries. Hepatobiliary: No focal liver abnormality is seen without contrast. There is a 1.4 cm rim calcified stone in the proximal gallbladder but no wall thickening or biliary dilatation. Pancreas: Unremarkable without contrast. Spleen: Unremarkable without contrast.  No splenomegaly. Adrenals/Urinary Tract: There is no adrenal mass. There is homogeneous noncontrast attenuation of the bilateral renal cortex. Fetal lobation is noted but no appreciable contour deforming mass of either kidney. There is no urinary stone or obstruction. There is no bladder thickening. Stomach/Bowel: There is mild fluid distention of the stomach. The duodenal and proximal jejunal segments are decompressed but there is dilatation in the distal jejunal segments/proximal ileum in the left upper to mid abdomen with bowel dilatation up to 4 cm. There is a feces filled transitional segment with abrupt caliber change in the left lower quadrant, best demonstrated on coronal reconstruction images 50-53 of series 5. I do not see a swirling of the mesentery at this level. The most likely obstructive etiology is adhesive disease.  There is mesenteric edema in the lower abdomen around the dilated segments especially in the area of  transition. Early ischemia is not excluded but no bowel pneumatosis is seen. Remainder of the small bowel is decompressed. An appendix is not seen. Large bowel wall is normal in thickness with uncomplicated diverticulosis. Vascular/Lymphatic: The abdominal aorta and common iliac/internal iliac arteries are heavily calcified. There is no AAA. No adenopathy is seen. Reproductive: Status post hysterectomy. No adnexal masses. Other: There is trace interloop fluid in the mesenteric folds in the left upper to mid abdomen. No pelvic ascites. There is no free air, free hemorrhage or abscess. There are no incarcerated hernias. Musculoskeletal: There is mild dextrorotary lumbar scoliosis with multilevel degenerative change, greatest at L3-4 where there is acquired spinal stenosis. There is osteopenia. IMPRESSION: 1. Small-bowel obstruction with transition in the left lower quadrant and small-bowel dilatation to 4 cm, most likely due to adhesive disease. There is mesenteric edema around the dilated segments especially in the area of transition. Early ischemia is not excluded but no bowel pneumatosis is seen. 2. Small anterior pericardial effusion, new from prior studies. 3. Cholelithiasis. 4. Aortic and coronary artery atherosclerosis. 5. Emphysema. 6. Osteopenia, lumbar scoliosis and degenerative change. Aortic Atherosclerosis (ICD10-I70.0) and Emphysema (ICD10-J43.9). Electronically Signed   By: Telford Nab M.D.   On: 08/12/2022 22:27     Time coordinating discharge: Over 30 minutes    Dwyane Dee, MD  [facility] 08/18/2022, 5:40 PM

## 2022-08-18 NOTE — Plan of Care (Signed)

## 2022-08-19 ENCOUNTER — Encounter: Payer: Self-pay | Admitting: *Deleted

## 2022-08-19 ENCOUNTER — Telehealth: Payer: Self-pay | Admitting: *Deleted

## 2022-08-19 ENCOUNTER — Other Ambulatory Visit: Payer: Self-pay | Admitting: Internal Medicine

## 2022-08-19 NOTE — Patient Outreach (Signed)
  Care Coordination TOC Note Transition Care Management Follow-up Telephone Call Date of discharge and from where: Wednesday 08/18/22; [facility]; abdominal pain; SBO How have you been since you were released from the hospital? "I am slowly but surely getting better, I think.  I am moving slow, but able to do everything for myself without much help from my sister.  I have started eating like they told me to, and just doing the best I can to recover and get over this.  I can't go over my medicines right now.  I have stopped the blood thinner like they told me to, and they didn't change anything else.  I am lying down and don't want to get up right now.  I have no questions about my medications and I am taking everything like they told me to" Any questions or concerns? No  Items Reviewed: Did the pt receive and understand the discharge instructions provided? Yes  Medications obtained and verified? Yes - partial medication review completed- patient declined full medication review: confirms she has stopped taking ACT as instructed; confirms no other changes to medications; reports she independently self- manages medications and endorses 100% adherence and denies questions/ concerns around medications Other? No  Any new allergies since your discharge? No  Dietary orders reviewed? Yes Do you have support at home? Yes  sister assists in care needs as indicated; patient reports she is independent in self-care  Home Care and Equipment/Supplies: Were home health services ordered? no If so, what is the name of the agency? N/A  Has the agency set up a time to come to the patient's home? not applicable Were any new equipment or medical supplies ordered?  No What is the name of the medical supply agency? N/A Were you able to get the supplies/equipment? not applicable Do you have any questions related to the use of the equipment or supplies? No N/A  Functional Questionnaire: (I = Independent and D =  Dependent) ADLs: I  Bathing/Dressing- I  Meal Prep- I  Eating- I  Maintaining continence- I  Transferring/Ambulation- I  Managing Meds- I  Follow up appointments reviewed:  PCP Hospital f/u appt confirmed? No  Scheduled to see - on - @ - sent request to schedulng [facility] guide team to facilitate scheduling prompt PCP appointment for hospital follow up [facility] f/u appt confirmed? No  Scheduled to see - on - @ - Are transportation arrangements needed? No  If their condition worsens, is the pt aware to call PCP or go to the Emergency Dept.? Yes Was the patient provided with contact information for the PCP's office or ED? No- patient declined; reports already has contact information for all care providers Was to pt encouraged to call back with questions or concerns? Yes  SDOH assessments and interventions completed:   Yes SDOH Interventions Today    Flowsheet Row Most Recent Value  SDOH Interventions   Food Insecurity Interventions Intervention Not Indicated  Transportation Interventions Intervention Not Indicated  [sister provides transportation]      Care Coordination Interventions:  PCP follow up appointment requested Reviewed/ reinforced recommended diet restrictions    Encounter Outcome:  Pt. Visit Completed    Oneta Rack, RN, BSN, CCRN Alumnus RN CM Care Coordination/ Transition of [place] Management [phone]: direct office

## 2022-08-19 NOTE — Progress Notes (Signed)
  Care Coordination  Note  08/19/2022 Name: Sonia Skinner MRN: [id number] DOB: 02-22-1951  EARLY ORD is a 72 y.o. year old primary care patient of [name], MD. I reached out to [name] by phone today to assist with scheduling a follow up appointment. [name] verbally consented to my assistance.       Follow up plan: Hospital Follow Up appointment scheduled with (Dr Ronnald Ramp) on (08/31/2022) at (4pm).  [name], [place] Direct Dial: [phone]

## 2022-08-20 ENCOUNTER — Ambulatory Visit (INDEPENDENT_AMBULATORY_CARE_PROVIDER_SITE_OTHER): Payer: Medicare Other | Admitting: Psychologist

## 2022-08-20 DIAGNOSIS — F331 Major depressive disorder, recurrent, moderate: Secondary | ICD-10-CM | POA: Diagnosis not present

## 2022-08-20 NOTE — Progress Notes (Signed)
[place] Counselor/Therapist Progress Note  Patient ID: Sonia Skinner, MRN: [id number],    Date: 08/20/2022  Time Spent: 11:05 am to 11:40 am; total time: 35 minutes   This session was held via phone  teletherapy due to the coronavirus risk at this time. The patient consented to phone teletherapy and was located at her home during this session. She is aware it is the responsibility of the patient to secure confidentiality on her end of the session. The provider was in a private home office for the duration of this session. Limits of confidentiality were discussed with the patient.   Treatment Type: Individual Therapy  Reported Symptoms: Difficulty with sleep and frustrations with healthcare system  Mental Status Exam: Appearance:  Well Groomed     Behavior: Appropriate  Motor: Normal  Speech/Language:  Clear and Coherent  Affect: Appropriate  Mood: normal  Thought process: normal  Thought content:   WNL  Sensory/Perceptual disturbances:   WNL  Orientation: oriented to person, place, and time/date  Attention: Good  Concentration: Good  Memory: WNL  Fund of knowledge:  Good  Insight:   Fair  Judgment:  Good  Impulse Control: Good   Risk Assessment: Danger to Self:  No Self-injurious Behavior: No Danger to Others: No Duty to Warn:no Physical Aggression / Violence:No  Access to Firearms a concern: No  Gang Involvement:No   Subjective: Beginning the session, patient described herself as doing okay despite being discharged yesterday from the hospital following experiencing a bowel obstruction. She reflected on events since the last session. She initially indicated that she wanted help with sleep. After discussing concerns related to sleep, she process frustrations with the healthcare system. She was agreeable to homework and following up. She denied suicidal and homicidal ideation.    Interventions:  Worked on developing a therapeutic relationship with the patient  using active listening and reflective statements. Provided emotional support using empathy and validation. Reviewed the treatment plan with the patient. Used summary and reflective statements. Normalized and validated expressed frustrations. Identified goals for the session. Explored patient's current sleep routine. Provided psychoeducation about sleep hygiene. Assisted in problem solving. Used socratic questions to assist the patient. Explored and validated frustrations related to the health care system. Processed the idea of contacting PCP for referral for physical therapy. Processed thoughts and emotions.Provided empathic statements. Assigned homework. Assessed for suicidal and homicidal ideation.   Homework: Implement tools for sleep hygiene. Contact PCP for referral for PT.   Next Session: Emotional support and review homework  Diagnosis: F33.1 major depressive affective disorder, recurrent, moderate  Plan:   Goals Alleviate depressive symptoms Recognize, accept, and cope with depressive feelings Develop healthy thinking patterns Develop healthy interpersonal relationships  Objectives target date for all objectives is 02/12/2023 Cooperate with a medication evaluation by a physician Verbalize an accurate understanding of depression Verbalize an understanding of the treatment Identify and replace thoughts that support depression Learn and implement behavioral strategies Verbalize an understanding and resolution of current interpersonal problems Learn and implement problem solving and decision making skills Learn and implement conflict resolution skills to resolve interpersonal problems Verbalize an understanding of healthy and unhealthy emotions verbalize insight into how past relationships may be influence current experiences with depression Use mindfulness and acceptance strategies and increase value based behavior  Increase hopeful statements about the future.   Interventions Consistent with treatment model, discuss how change in cognitive, behavioral, and interpersonal can help client alleviate depression CBT Behavioral activation help the client explore the  relationship, nature of the dispute,  Help the client develop new interpersonal skills and relationships Conduct Problem so living therapy Teach conflict resolution skills Use a process-experiential approach Conduct TLDP Conduct ACT Evaluate need for psychotropic medication Monitor adherence to medication   The patient and clinician reviewed the treatment plan on 03/11/2022. The patient approved of the treatment plan.   Conception Chancy, PsyD

## 2022-08-23 ENCOUNTER — Encounter: Payer: Self-pay | Admitting: Internal Medicine

## 2022-08-23 ENCOUNTER — Other Ambulatory Visit: Payer: Self-pay | Admitting: Internal Medicine

## 2022-08-23 DIAGNOSIS — I63512 Cerebral infarction due to unspecified occlusion or stenosis of left middle cerebral artery: Secondary | ICD-10-CM

## 2022-08-27 ENCOUNTER — Encounter (HOSPITAL_COMMUNITY): Payer: Self-pay | Admitting: Psychiatry

## 2022-08-27 ENCOUNTER — Telehealth (HOSPITAL_BASED_OUTPATIENT_CLINIC_OR_DEPARTMENT_OTHER): Payer: Medicare Other | Admitting: Psychiatry

## 2022-08-27 VITALS — Wt 121.0 lb

## 2022-08-27 DIAGNOSIS — F331 Major depressive disorder, recurrent, moderate: Secondary | ICD-10-CM

## 2022-08-27 DIAGNOSIS — F411 Generalized anxiety disorder: Secondary | ICD-10-CM

## 2022-08-27 DIAGNOSIS — R413 Other amnesia: Secondary | ICD-10-CM

## 2022-08-27 MED ORDER — BUPROPION HCL ER (XL) 300 MG PO TB24: 300.0000 mg | ORAL_TABLET | Freq: Every day | ORAL | 0 refills | Status: DC

## 2022-08-27 NOTE — Progress Notes (Signed)
Virtual Visit via Telephone Note  I connected with Sonia Skinner on 08/27/22 at 10:20 AM EST by telephone and verified that I am speaking with the correct person using two identifiers.  Location: Patient: Home Provider: Home Office   I discussed the limitations, risks, security and privacy concerns of performing an evaluation and management service by telephone and the availability of in person appointments. I also discussed with the patient that there may be a patient responsible charge related to this service. The patient expressed understanding and agreed to proceed.   History of Present Illness: Patient is evaluated by phone session.  She was recently admitted on the medical floor because of acute abdominal pain.  No major changes in her medication list.  She is still complaining of chronic abdominal pain along with fatigue, tiredness, memory issues, tremoring, shakes and sometimes gets confusion.  Patient has the symptoms for a while but now she noticed started to get worse and.  She noticed getting easily irritable, frustrated and angry.  She sleeps 2 to 4 hours.  She again talked about not getting B12.  In the hospital CBC shows hemoglobin 9.9 and RBC 3.5.  She admitted most of her depression and anxiety is due to fatigue and sometimes she has difficulty in concentration.  Her sister is living with her to help her.  We have recommended to see a neurologist for chronic memory issues and forgetfulness and now she is going to start neurorehab.  She also had appointment with her PCP Dr. Ronnald Ramp next week.  She denies any hallucination, paranoia but admitted being depressed, withdrawn and isolated.  Patient has a history of stroke and she also helps her husband who has above-knee amputation.  She is in therapy with Joan Flores but does not see on a regular basis and visit comes usually 2 to 3 months.  She is only taking Wellbutrin and REXULTI which we increased on the last visit.  She has not seen any  significant improvement and wondering if that medicine causing tremors.  In the past she had tried Prozac and then was switched to Paxil but they were also discontinued due to persistent nausea.  Patient is on Ozempic and it is unclear if Ozempic is causing chronic nausea.  We have recommended genetic testing the patient did not come but now very much like to get the testing.  Her appetite is fair.  Her weight is unchanged from the past.    Past Psychiatric History:  H/O taking antidepressant on and off most of life. H/O overdose and inpatient at [place] provider at [facility] and given the Strattera, Adderall, Zoloft, Celexa, Lamictal and Lexapro.  Never tested for ADHD. No h/o psychosis, mania or hallucination.  In 2001 moved to [facility] and given amitriptyline and Wellbutrin to stop smoking. Tried Prozac and Paxil but mage tired and nausea.   Psychiatric Specialty Exam: Physical Exam  Review of Systems  Constitutional:  Positive for fatigue.  Gastrointestinal:  Positive for abdominal pain.  Neurological:  Positive for tremors and numbness.       Tingling    Weight 121 lb (54.9 kg).There is no height or weight on file to calculate BMI.  General Appearance: NA  Eye Contact:  NA  Speech:  Slow  Volume:  Decreased  Mood:  Anxious, Dysphoric, and Irritable  Affect:  NA  Thought Process:  Descriptions of Associations: Intact  Orientation:  Full (Time, Place, and Person)  Thought Content:  Rumination  Suicidal Thoughts:  No  Homicidal Thoughts:  No  Memory:  Immediate;   Fair Recent;   Fair Remote;   Fair  Judgement:  Fair  Insight:  Shallow  Psychomotor Activity:  NA  Concentration:  Concentration: Fair and Attention Span: Fair  Recall:  [employer] of Knowledge:  Fair  Language:  Fair  Akathisia:  No  Handed:  Right  AIMS (if indicated):     Assets:  Communication Skills Desire for Improvement Housing Social Support Transportation  ADL's:  Intact   Cognition:  WNL  Sleep:   4-5 hrs      Assessment and Plan: Major depressive disorder, recurrent.  Generalized anxiety disorder.  Memory changes.  I reviewed blood work results from recent hospitalization.  She complained of excessive tiredness, fatigue and irritability.  Discontinue REXULTI as patient believed it was causing tremors and did not see any improvement.  Patient will start neuro rehab next week.  She is very upset about not getting B12 and recent labs shows she has low hemoglobin and hematocrit.  I explained it could be the reason that she is very tired.  Reemphasize genetic testing and patient is coming to our office for swab sample.  For now she will continue only Wellbutrin XL 300 mg daily until we get the genetic testing results.  Most of her symptoms are medical related and I will forward my notes to her PCP Dr. Ronnald Ramp.  She has chronic nausea which could be related to Ozempic.  Recommended to call us back if she has any question or any concern.  We will follow-up in a month.  Follow Up Instructions:    I discussed the assessment and treatment plan with the patient. The patient was provided an opportunity to ask questions and all were answered. The patient agreed with the plan and demonstrated an understanding of the instructions.   The patient was advised to call back or seek an in-person evaluation if the symptoms worsen or if the condition fails to improve as anticipated.  Collaboration of Care: Other provider involved in patient's care AEB notes are available in epic to review.  Patient/Guardian was advised Release of Information must be obtained prior to any record release in order to collaborate their care with an outside provider. Patient/Guardian was advised if they have not already done so to contact the registration department to sign all necessary forms in order for [place] to release information regarding their care.   Consent: Patient/Guardian gives verbal consent for  treatment and assignment of benefits for services provided during this visit. Patient/Guardian expressed understanding and agreed to proceed.    I provided 28 minutes of non-face-to-face time during this encounter.   Kathlee Nations, MD

## 2022-08-31 ENCOUNTER — Telehealth (HOSPITAL_COMMUNITY): Payer: Self-pay | Admitting: *Deleted

## 2022-08-31 ENCOUNTER — Encounter: Payer: Self-pay | Admitting: Internal Medicine

## 2022-08-31 ENCOUNTER — Ambulatory Visit (INDEPENDENT_AMBULATORY_CARE_PROVIDER_SITE_OTHER): Payer: Medicare Other | Admitting: Internal Medicine

## 2022-08-31 ENCOUNTER — Ambulatory Visit (INDEPENDENT_AMBULATORY_CARE_PROVIDER_SITE_OTHER): Payer: Medicare Other

## 2022-08-31 ENCOUNTER — Ambulatory Visit (HOSPITAL_COMMUNITY): Payer: Medicare Other | Admitting: *Deleted

## 2022-08-31 VITALS — BP 128/60 | HR 90 | Temp 98.4°F | Resp 16 | Ht 62.0 in | Wt 116.0 lb

## 2022-08-31 DIAGNOSIS — I10 Essential (primary) hypertension: Secondary | ICD-10-CM

## 2022-08-31 DIAGNOSIS — K5904 Chronic idiopathic constipation: Secondary | ICD-10-CM | POA: Diagnosis not present

## 2022-08-31 DIAGNOSIS — E118 Type 2 diabetes mellitus with unspecified complications: Secondary | ICD-10-CM | POA: Diagnosis not present

## 2022-08-31 DIAGNOSIS — R10814 Left lower quadrant abdominal tenderness: Secondary | ICD-10-CM

## 2022-08-31 DIAGNOSIS — N184 Chronic kidney disease, stage 4 (severe): Secondary | ICD-10-CM | POA: Diagnosis not present

## 2022-08-31 DIAGNOSIS — Z794 Long term (current) use of insulin: Secondary | ICD-10-CM | POA: Diagnosis not present

## 2022-08-31 DIAGNOSIS — E538 Deficiency of other specified B group vitamins: Secondary | ICD-10-CM

## 2022-08-31 DIAGNOSIS — R1032 Left lower quadrant pain: Secondary | ICD-10-CM | POA: Diagnosis not present

## 2022-08-31 MED ORDER — LUBIPROSTONE 24 MCG PO CAPS: 24.0000 ug | ORAL_CAPSULE | Freq: Two times a day (BID) | ORAL | 1 refills | Status: AC

## 2022-08-31 MED ORDER — CYANOCOBALAMIN 1000 MCG/ML IJ SOLN
1000.0000 ug | Freq: Once | INTRAMUSCULAR | Status: AC
  Administered 2022-08-31: 1000 ug via INTRAMUSCULAR

## 2022-08-31 NOTE — Telephone Encounter (Signed)
Pt had a nurse visit scheduled today @ 1000 for GeneSight testing. Pt LVM stating that she was running late. Writer returned call having to LVM as well advising that she may come in later without any issues.

## 2022-08-31 NOTE — Patient Instructions (Signed)

## 2022-08-31 NOTE — Progress Notes (Signed)
Subjective:  Patient ID: Sonia Skinner, female    DOB: 12-15-1950  Age: 72 y.o. MRN: [id number]  CC: Anemia and Diabetes   HPI Sonia Skinner presents for f/up -    Her symptoms have improved but she continues to complain of constipation, abdominal pain, and nausea with no vomiting.  Appetite is good but she is lost a few pounds.   PCP: [name], MD   Admit date: 08/12/2022 Discharge date: 08/18/2022  Barriers to discharge: none   Admitted From: Home Disposition:  Home Discharging physician: Dwyane Dee, MD   Recommendations for Outpatient Follow-up:  Resume chronic medical management    Home Health:  Equipment/Devices:    Discharge Condition: stable CODE STATUS: Full Diet recommendation:  Diet Orders (From admission, onward)        Start     Ordered    08/17/22 1539   DIET SOFT Room service appropriate? Yes; Fluid consistency: Thin  Diet effective now       Question Answer Comment  Room service appropriate? Yes    Fluid consistency: Thin       08/17/22 1538                  Hospital Course: Sonia Skinner is a 72 yo female with PMH CVA, anxiety/depression, SBO, peritonitis, HTN who presented with abdominal pain.  Her pain was similar to prior episode of her SBO. CT abdomen/pelvis on admission showed small bowel obstruction with transition zone in the left lower quadrant and small bowel dilation to 4 cm.  She was admitted and general surgery was consulted. She was placed on bowel rest and underwent small bowel follow-through. She clinically improved slowly and diet was able to be reintroduced and slowly advanced.   Assessment and Plan:   Partial SBO - Noted on CT abdomen/pelvis on admission with transition zone in the LLQ and small bowel dilated to 4 cm - General surgery has been following, appreciate assistance - Patient started on bowel rest initially with slow advancement after small bowel follow-through initiated -Contrast has been moving through -Patient  tolerated full liquids with advancement to soft diet -Patient had return of bowel movements prior to discharge   AKI on CKD stage IV baseline creatinine around 1.9 - 2 -Creatinine 2.63 on admission, suspected prerenal from hypovolemia - Responded well to fluids   Essential hypertension -Home regimen resumed at discharge   DMII -Continue home regimen   Hypothyroidism cont synthroid   Hyperlipidemia -Continue home regimen   Major depressive disorder -Resume brexpiprazole, Paxil, Wellbutrin   h/o Cerebrovascular accident (CVA) -No longer on Brilinta - Continue aspirin - Resume statin at discharge     The patient's chronic medical conditions were treated accordingly per the patient's home medication regimen except as noted.  On day of discharge, patient was felt deemed stable for discharge. Patient/family member advised to call PCP or come back to ER if needed.    Principal Diagnosis: SBO (small bowel obstruction) ([place])   Discharge Diagnoses:      Active Hospital Problems    Diagnosis Date Noted   SBO (small bowel obstruction) ([place]) 08/12/2022      Priority: 1.   Chronic renal disease, stage 4, severely decreased glomerular filtration rate (GFR) between 15-29 mL/min/1.73 square meter (HCC) 06/08/2022   Multinodular goiter 11/02/2021   Major depressive disorder, recurrent episode ([place]) 02/20/2016   Cerebrovascular accident (CVA) due to stenosis of left middle cerebral artery ([place]) 11/02/2013   Tobacco abuse 07/31/2013  Essential hypertension, benign 04/20/2013   Type II diabetes mellitus with manifestations ([place]) 04/20/2013   Hypothyroidism 04/20/2013   Hyperlipidemia with target LDL less than 70 04/20/2013        Outpatient Medications Prior to Visit  Medication Sig Dispense Refill   Accu-Chek FastClix Lancets MISC Use to check blood sugar five times daily. 510 each 2   ACCU-CHEK GUIDE test strip      acetaminophen (TYLENOL) 500 MG tablet Take 1,000-1,500 mg by mouth  every 6 (six) hours as needed for mild pain.     amLODipine (NORVASC) 10 MG tablet Take 1 tablet (10 mg total) by mouth daily. 90 tablet 3   aspirin 81 MG chewable tablet Chew 1 tablet (81 mg total) by mouth daily. 30 tablet 1   atorvastatin (LIPITOR) 40 MG tablet Take 1 tablet by mouth daily. 90 tablet 0   brexpiprazole (REXULTI) 1 MG TABS tablet Take 1 tablet (1 mg total) by mouth daily. 30 tablet 0   buPROPion (WELLBUTRIN XL) 300 MG 24 hr tablet Take 1 tablet (300 mg total) by mouth daily. 30 tablet 0   Cholecalciferol (VITAMIN D3 PO) Take 1 tablet by mouth daily. Unsure of dose     Continuous Blood Gluc Receiver (DEXCOM G7 RECEIVER) DEVI 1 Act by Does not apply route daily. 2 each 5   Continuous Blood Gluc Sensor (DEXCOM G7 SENSOR) MISC 1 Act by Does not apply route daily. 2 each 5   Continuous Blood Gluc Sensor (DEXCOM G7 SENSOR) MISC 1 Act by Does not apply route daily. 2 each 5   Continuous Blood Gluc Sensor (DEXCOM G7 SENSOR) MISC USE AS DIRECTED TOPICALLY  EVERY  10  DAYS 18 each 1   dapagliflozin propanediol (FARXIGA) 10 MG TABS tablet Take 1 tablet (10 mg total) by mouth daily before breakfast. 90 tablet 3   dexlansoprazole (DEXILANT) 60 MG capsule Take 1 capsule by mouth daily. 90 capsule 0   EDARBYCLOR 40-12.5 MG TABS Take 1 tablet by mouth daily. 90 tablet 0   ezetimibe (ZETIA) 10 MG tablet Take 1 tablet by mouth once daily. 90 tablet 1   gabapentin (NEURONTIN) 100 MG capsule Take 1 capsule (100 mg total) by mouth 3 (three) times daily. 270 capsule 1   HUMALOG KWIKPEN 200 UNIT/ML KwikPen Inject 5 units under the skin three times daily with meals. (Patient taking differently: Inject 5 Units into the skin daily as needed.) 9 mL 0   HYDROcodone-acetaminophen (NORCO) 5-325 MG tablet Take 1 tablet by mouth every 6 (six) hours as needed for moderate pain. 100 tablet 0   insulin glargine, 1 Unit Dial, (TOUJEO SOLOSTAR) 300 UNIT/ML Solostar Pen Inject 20-22 Units into the skin daily. (Patient  taking differently: Inject 20-22 Units into the skin daily. Per sliding scale) 18 mL 3   levothyroxine (SYNTHROID) 88 MCG tablet Take 1 tablet (88 mcg total) by mouth daily. 90 tablet 3   loperamide (IMODIUM) 2 MG capsule Take 1 capsule (2 mg total) by mouth 4 (four) times daily as needed for diarrhea or loose stools. 12 capsule 0   loratadine (CLARITIN) 10 MG tablet Take 1 tablet (10 mg total) by mouth daily. 90 tablet 0   PARoxetine (PAXIL) 20 MG tablet Take 1 tablet (20 mg total) by mouth daily. 30 tablet 1   REXULTI 0.5 MG TABS Take 1 tablet by mouth daily.     Semaglutide, 2 MG/DOSE, 8 MG/3ML SOPN Inject 2 mg as directed once a week. 9 mL 3  No facility-administered medications prior to visit.    ROS Review of Systems  Constitutional:  Positive for unexpected weight change. Negative for chills, diaphoresis and fatigue.  HENT: Negative.    Eyes: Negative.   Respiratory:  Negative for cough, chest tightness, shortness of breath and wheezing.   Cardiovascular:  Negative for chest pain, palpitations and leg swelling.  Gastrointestinal:  Positive for abdominal pain, constipation and nausea. Negative for blood in stool, diarrhea and vomiting.  Endocrine: Negative.   Genitourinary: Negative.  Negative for difficulty urinating and dysuria.  Musculoskeletal:  Negative for arthralgias and back pain.  Skin: Negative.   Neurological: Negative.  Negative for dizziness and weakness.  Hematological:  Negative for adenopathy. Does not bruise/bleed easily.  Psychiatric/Behavioral: Negative.      Objective:  BP 128/60 (BP Location: Right Arm, Patient Position: Sitting, Cuff Size: Large)   Pulse 90   Temp 98.4 F (36.9 C) (Oral)   Resp 16   Ht '5\' 2"'$  (1.575 m)   Wt 116 lb (52.6 kg)   SpO2 98%   BMI 21.22 kg/m   BP Readings from Last 3 Encounters:  08/31/22 128/60  08/18/22 (!) 109/56  06/23/22 130/60    Wt Readings from Last 3 Encounters:  08/31/22 116 lb (52.6 kg)  08/14/22 121 lb  7.6 oz (55.1 kg)  06/23/22 123 lb (55.8 kg)    Physical Exam Vitals reviewed.  Constitutional:      General: She is not in acute distress.    Appearance: She is not ill-appearing, toxic-appearing or diaphoretic.  HENT:     Nose: Nose normal.     Mouth/Throat:     Mouth: Mucous membranes are moist.  Eyes:     General: No scleral icterus.    Conjunctiva/sclera: Conjunctivae normal.  Cardiovascular:     Rate and Rhythm: Normal rate and regular rhythm.     Heart sounds: No murmur heard. Pulmonary:     Effort: Pulmonary effort is normal.     Breath sounds: No stridor. No wheezing, rhonchi or rales.  Abdominal:     General: Abdomen is flat. Bowel sounds are decreased. There is no distension.     Palpations: There is no hepatomegaly, splenomegaly or mass.     Tenderness: There is abdominal tenderness in the left lower quadrant.  Musculoskeletal:        General: Normal range of motion.     Cervical back: Neck supple.     Right lower leg: No edema.     Left lower leg: No edema.  Lymphadenopathy:     Cervical: No cervical adenopathy.  Skin:    General: Skin is warm and dry.  Neurological:     General: No focal deficit present.     Mental Status: She is alert.  Psychiatric:        Mood and Affect: Mood normal.        Behavior: Behavior normal.     Lab Results  Component Value Date   WBC 10.4 08/15/2022   HGB 9.9 (L) 08/15/2022   HCT 30.9 (L) 08/15/2022   PLT 215 08/15/2022   GLUCOSE 168 (H) 08/18/2022   CHOL 184 03/29/2022   TRIG 146.0 03/29/2022   HDL 45.10 03/29/2022   LDLDIRECT 141 (H) 03/04/2010   LDLCALC 110 (H) 03/29/2022   ALT 20 08/12/2022   AST 15 08/12/2022   NA 140 08/18/2022   K 4.6 08/18/2022   CL 104 08/18/2022   CREATININE 1.34 (H) 08/18/2022   BUN <5 (  L) 08/18/2022   CO2 25 08/18/2022   TSH 4.23 03/29/2022   INR 1.1 11/25/2021   HGBA1C 8.9 (H) 03/29/2022   MICROALBUR <0.7 03/29/2022    DG Abd Portable 1V-Small Bowel Obstruction  Protocol-initial, 8 hr delay  Result Date: 08/13/2022 CLINICAL DATA:  Small-bowel obstruction, 8 hour delay film. EXAM: PORTABLE ABDOMEN - 1 VIEW COMPARISON:  June 21, 2018 FINDINGS: The bowel gas pattern is normal. Radiopaque contrast is seen throughout the large bowel. No radio-opaque calculi or other significant radiographic abnormality are seen. IMPRESSION: Nonobstructive bowel gas pattern. Electronically Signed   By: Virgina Norfolk M.D.   On: 08/13/2022 19:20   CT ABDOMEN PELVIS WO CONTRAST  Result Date: 08/12/2022 CLINICAL DATA:  Abdominal pain. EXAM: CT ABDOMEN AND PELVIS WITHOUT CONTRAST TECHNIQUE: Multidetector CT imaging of the abdomen and pelvis was performed following the standard protocol without IV contrast. RADIATION DOSE REDUCTION: This exam was performed according to the departmental dose-optimization program which includes automated exposure control, adjustment of the mA and/or kV according to patient size and/or use of iterative reconstruction technique. COMPARISON:  CT without contrast 02/17/2022, CT with contrast 08/17/2009. FINDINGS: Lower chest: Lung bases are mildly emphysematous but clear. The cardiac size is normal. A small anterior pericardial effusion is new from prior studies. There calcifications of the mitral ring and coronary arteries. Hepatobiliary: No focal liver abnormality is seen without contrast. There is a 1.4 cm rim calcified stone in the proximal gallbladder but no wall thickening or biliary dilatation. Pancreas: Unremarkable without contrast. Spleen: Unremarkable without contrast.  No splenomegaly. Adrenals/Urinary Tract: There is no adrenal mass. There is homogeneous noncontrast attenuation of the bilateral renal cortex. Fetal lobation is noted but no appreciable contour deforming mass of either kidney. There is no urinary stone or obstruction. There is no bladder thickening. Stomach/Bowel: There is mild fluid distention of the stomach. The duodenal and  proximal jejunal segments are decompressed but there is dilatation in the distal jejunal segments/proximal ileum in the left upper to mid abdomen with bowel dilatation up to 4 cm. There is a feces filled transitional segment with abrupt caliber change in the left lower quadrant, best demonstrated on coronal reconstruction images 50-53 of series 5. I do not see a swirling of the mesentery at this level. The most likely obstructive etiology is adhesive disease. There is mesenteric edema in the lower abdomen around the dilated segments especially in the area of transition. Early ischemia is not excluded but no bowel pneumatosis is seen. Remainder of the small bowel is decompressed. An appendix is not seen. Large bowel wall is normal in thickness with uncomplicated diverticulosis. Vascular/Lymphatic: The abdominal aorta and common iliac/internal iliac arteries are heavily calcified. There is no AAA. No adenopathy is seen. Reproductive: Status post hysterectomy. No adnexal masses. Other: There is trace interloop fluid in the mesenteric folds in the left upper to mid abdomen. No pelvic ascites. There is no free air, free hemorrhage or abscess. There are no incarcerated hernias. Musculoskeletal: There is mild dextrorotary lumbar scoliosis with multilevel degenerative change, greatest at L3-4 where there is acquired spinal stenosis. There is osteopenia. IMPRESSION: 1. Small-bowel obstruction with transition in the left lower quadrant and small-bowel dilatation to 4 cm, most likely due to adhesive disease. There is mesenteric edema around the dilated segments especially in the area of transition. Early ischemia is not excluded but no bowel pneumatosis is seen. 2. Small anterior pericardial effusion, new from prior studies. 3. Cholelithiasis. 4. Aortic and coronary artery atherosclerosis. 5. Emphysema.  6. Osteopenia, lumbar scoliosis and degenerative change. Aortic Atherosclerosis (ICD10-I70.0) and Emphysema (ICD10-J43.9).  Electronically Signed   By: Telford Nab M.D.   On: 08/12/2022 22:27    DG ABD ACUTE 2+V W 1V CHEST  Result Date: 08/31/2022 CLINICAL DATA:  Left lower quadrant abdominal pain EXAM: DG ABDOMEN ACUTE WITH 1 VIEW CHEST COMPARISON:  Chest x-ray dated March 29, 2022 FINDINGS: There is no evidence of dilated bowel loops or free intraperitoneal air. No radiopaque calculi or other significant radiographic abnormality is seen. Heart size and mediastinal contours are within normal limits. Both lungs are clear. Dextrocurvature of the lumbar spine. IMPRESSION: Negative abdominal radiographs.  No acute cardiopulmonary disease. Electronically Signed   By: Yetta Glassman M.D.   On: 08/31/2022 16:47   DG Abd 1 View  Result Date: 08/15/2022 CLINICAL DATA:  SBO EXAM: ABDOMEN - 1 VIEW COMPARISON:  08/13/2022 FINDINGS: Unremarkable bowel gas pattern. Majority of the retained oral contrast observed in the colon on the prior study is no longer present. No gaseous distention of bowel identified. IMPRESSION: Negative. Electronically Signed   By: Sammie Bench M.D.   On: 08/15/2022 10:34   DG Abd Portable 1V-Small Bowel Obstruction Protocol-initial, 8 hr delay  Result Date: 08/13/2022 CLINICAL DATA:  Small-bowel obstruction, 8 hour delay film. EXAM: PORTABLE ABDOMEN - 1 VIEW COMPARISON:  June 21, 2018 FINDINGS: The bowel gas pattern is normal. Radiopaque contrast is seen throughout the large bowel. No radio-opaque calculi or other significant radiographic abnormality are seen. IMPRESSION: Nonobstructive bowel gas pattern. Electronically Signed   By: Virgina Norfolk M.D.   On: 08/13/2022 19:20   CT ABDOMEN PELVIS WO CONTRAST  Result Date: 08/12/2022 CLINICAL DATA:  Abdominal pain. EXAM: CT ABDOMEN AND PELVIS WITHOUT CONTRAST TECHNIQUE: Multidetector CT imaging of the abdomen and pelvis was performed following the standard protocol without IV contrast. RADIATION DOSE REDUCTION: This exam was performed  according to the departmental dose-optimization program which includes automated exposure control, adjustment of the mA and/or kV according to patient size and/or use of iterative reconstruction technique. COMPARISON:  CT without contrast 02/17/2022, CT with contrast 08/17/2009. FINDINGS: Lower chest: Lung bases are mildly emphysematous but clear. The cardiac size is normal. A small anterior pericardial effusion is new from prior studies. There calcifications of the mitral ring and coronary arteries. Hepatobiliary: No focal liver abnormality is seen without contrast. There is a 1.4 cm rim calcified stone in the proximal gallbladder but no wall thickening or biliary dilatation. Pancreas: Unremarkable without contrast. Spleen: Unremarkable without contrast.  No splenomegaly. Adrenals/Urinary Tract: There is no adrenal mass. There is homogeneous noncontrast attenuation of the bilateral renal cortex. Fetal lobation is noted but no appreciable contour deforming mass of either kidney. There is no urinary stone or obstruction. There is no bladder thickening. Stomach/Bowel: There is mild fluid distention of the stomach. The duodenal and proximal jejunal segments are decompressed but there is dilatation in the distal jejunal segments/proximal ileum in the left upper to mid abdomen with bowel dilatation up to 4 cm. There is a feces filled transitional segment with abrupt caliber change in the left lower quadrant, best demonstrated on coronal reconstruction images 50-53 of series 5. I do not see a swirling of the mesentery at this level. The most likely obstructive etiology is adhesive disease. There is mesenteric edema in the lower abdomen around the dilated segments especially in the area of transition. Early ischemia is not excluded but no bowel pneumatosis is seen. Remainder of the small bowel is  decompressed. An appendix is not seen. Large bowel wall is normal in thickness with uncomplicated diverticulosis.  Vascular/Lymphatic: The abdominal aorta and common iliac/internal iliac arteries are heavily calcified. There is no AAA. No adenopathy is seen. Reproductive: Status post hysterectomy. No adnexal masses. Other: There is trace interloop fluid in the mesenteric folds in the left upper to mid abdomen. No pelvic ascites. There is no free air, free hemorrhage or abscess. There are no incarcerated hernias. Musculoskeletal: There is mild dextrorotary lumbar scoliosis with multilevel degenerative change, greatest at L3-4 where there is acquired spinal stenosis. There is osteopenia. IMPRESSION: 1. Small-bowel obstruction with transition in the left lower quadrant and small-bowel dilatation to 4 cm, most likely due to adhesive disease. There is mesenteric edema around the dilated segments especially in the area of transition. Early ischemia is not excluded but no bowel pneumatosis is seen. 2. Small anterior pericardial effusion, new from prior studies. 3. Cholelithiasis. 4. Aortic and coronary artery atherosclerosis. 5. Emphysema. 6. Osteopenia, lumbar scoliosis and degenerative change. Aortic Atherosclerosis (ICD10-I70.0) and Emphysema (ICD10-J43.9). Electronically Signed   By: Telford Nab M.D.   On: 08/12/2022 22:27     Assessment & Plan:   Sonia Skinner was seen today for anemia and diabetes.  Diagnoses and all orders for this visit:  Essential hypertension, benign -     TSH; Future  Type II diabetes mellitus with manifestations (HCC) -     Hemoglobin A1c; Future  Chronic renal disease, stage 4, severely decreased glomerular filtration rate (GFR) between 15-29 mL/min/1.73 square meter (HCC)  Left lower quadrant abdominal tenderness without rebound tenderness- Her symptoms have improved and the plain films are reassuring.  I do not see any recurrence of the small bowel obstruction. -     DG ABD ACUTE 2+V W 1V CHEST; Future  Chronic idiopathic constipation -     lubiprostone (AMITIZA) 24 MCG capsule; Take 1  capsule (24 mcg total) by mouth 2 (two) times daily with a meal. -     TSH; Future -     Magnesium; Future  B12 deficiency -     cyanocobalamin (VITAMIN B12) injection 1,000 mcg   I am having Sonia Skinner. Huq start on lubiprostone. I am also having her maintain her Accu-Chek FastClix Lancets, loratadine, acetaminophen, Cholecalciferol (VITAMIN D3 PO), aspirin, Dexcom G7 Sensor, Dexcom G7 Receiver, Dexcom G7 Sensor, gabapentin, loperamide, Accu-Chek Guide, amLODipine, levothyroxine, Semaglutide (2 MG/DOSE), dapagliflozin propanediol, Toujeo SoloStar, PARoxetine, HYDROcodone-acetaminophen, Dexcom G7 Sensor, HumaLOG KwikPen, Edarbyclor, ezetimibe, brexpiprazole, Rexulti, atorvastatin, dexlansoprazole, and buPROPion. We administered cyanocobalamin.  Meds ordered this encounter  Medications   lubiprostone (AMITIZA) 24 MCG capsule    Sig: Take 1 capsule (24 mcg total) by mouth 2 (two) times daily with a meal.    Dispense:  180 capsule    Refill:  1   cyanocobalamin (VITAMIN B12) injection 1,000 mcg     Follow-up: Return in about 3 months (around 11/30/2022).  Scarlette Calico, MD

## 2022-09-03 ENCOUNTER — Ambulatory Visit: Payer: 59 | Attending: Internal Medicine

## 2022-09-03 ENCOUNTER — Telehealth: Payer: Self-pay

## 2022-09-03 DIAGNOSIS — R2689 Other abnormalities of gait and mobility: Secondary | ICD-10-CM

## 2022-09-03 DIAGNOSIS — R2681 Unsteadiness on feet: Secondary | ICD-10-CM | POA: Diagnosis not present

## 2022-09-03 DIAGNOSIS — R278 Other lack of coordination: Secondary | ICD-10-CM | POA: Diagnosis not present

## 2022-09-03 NOTE — Telephone Encounter (Signed)
Dr. Ronnald Ramp,  Vara Guardian was evaluated by PT on 09/03/22.  The patient would benefit from a speech therapy evaluation for memory deficits and word finding difficulties, per patient request.    If you agree, please place an order in [facility] workque in [facility] or fax the order to [phone].  Thank you, Debbora Dus, PT, DPT, [facility] [address] [place], [place]  [address] Phone:  [phone] Fax:  [phone]

## 2022-09-03 NOTE — Therapy (Signed)
OUTPATIENT PHYSICAL THERAPY NEURO EVALUATION   Patient Name: Sonia Skinner MRN: [id number] DOB:[birth year], 72 y.o., female Today's Date: 09/03/2022   PCP: Sonia Lima, MD  REFERRING PROVIDER: [name], MD   END OF SESSION:  PT End of Session - 09/03/22 1225     Visit Number 1    Number of Visits 1    Authorization Type [employer] [place] Time 1230    PT Stop Time 1254    PT Time Calculation (min) 24 min    Activity Tolerance Patient tolerated treatment well    Behavior During Therapy [facility] for tasks assessed/performed             Past Medical History:  Diagnosis Date   Ankle fracture    Left   Anxiety    Carotid artery occlusion    Closed fracture of left distal fibula 02/21/2955   Complication of anesthesia    ALLERY TO ESTER BASE   Depression    early 77s   Diabetes mellitus    INSULIN DEPENDENT   GERD (gastroesophageal reflux disease)    Headache    years ago   Hypertension    Pneumonia    Stroke [facility]) March 2015    left MCA infarct, slight weakness on left side   Thyroid disease    Past Surgical History:  Procedure Laterality Date   ABDOMINAL HYSTERECTOMY     ANTERIOR FIXATION AND POSTERIOR MICRODISCECTOMY CERVICAL SPINE  1999   CAROTID ENDARTERECTOMY Left 11-04-13   cea   ENDARTERECTOMY Left 11/04/2013   IR ANGIO INTRA EXTRACRAN SEL COM CAROTID INNOMINATE BILAT MOD SED  11/16/2021   IR ANGIO INTRA EXTRACRAN SEL COM CAROTID INNOMINATE UNI L MOD SED  11/19/2021   IR ANGIO VERTEBRAL SEL VERTEBRAL BILAT MOD SED  11/16/2021   IR CT HEAD LTD  11/19/2021   IR INTRAVSC STENT CERV CAROTID W/EMB-PROT MOD SED INCL ANGIO  11/19/2021   IR RADIOLOGIST EVAL & MGMT  12/13/2021   ORIF ANKLE FRACTURE Left 09/16/2017   Procedure: OPEN REDUCTION INTERNAL FIXATION (ORIF) LEFT ANKLE FRACTURE;  Surgeon: Marchia Bond, MD;  Location: [place];  Service: Orthopedics;  Laterality: Left;   RADIOLOGY WITH ANESTHESIA N/A 11/19/2021   Procedure: Left carotid angioplasty with possible  stenting;  Surgeon: Luanne Bras, MD;  Location: [place];  Service: Radiology;  Laterality: N/A;   Patient Active Problem List   Diagnosis Date Noted   Left lower quadrant abdominal tenderness without rebound tenderness 08/31/2022   Chronic idiopathic constipation 08/31/2022   Chronic renal disease, stage 4, severely decreased glomerular filtration rate (GFR) between 15-29 mL/min/1.73 square meter ([place]) 06/08/2022   Chronic midline low back pain with bilateral sciatica 06/01/2022   Multinodular goiter 11/02/2021   Encounter for general adult medical examination with abnormal findings 04/08/2021   Visit for screening mammogram 04/01/2020   Type 2 diabetes mellitus with complication, with long-term current use of insulin ([place]) 09/18/2018   Age-related osteoporosis with current pathological fracture 02/20/2018   Intractable migraine without aura and with status migrainosus 11/24/2016   GERD (gastroesophageal reflux disease) 03/08/2016   Major depressive disorder, recurrent episode ([place]) 02/20/2016   Occlusion and stenosis of carotid artery with cerebral infarction 11/25/2013   Cerebrovascular accident (CVA) due to stenosis of left middle cerebral artery ([place]) 11/02/2013   Vitamin D deficiency 10/19/2013   Tobacco abuse 07/31/2013   Essential hypertension, benign 04/20/2013   Type II diabetes mellitus with manifestations ([place]) 04/20/2013  Hypothyroidism 04/20/2013   Hyperlipidemia with target LDL less than 70 04/20/2013    ONSET DATE:   08/23/2022  referral  REFERRING DIAG: O27.035 (ICD-10-CM) - Cerebrovascular accident (CVA) due to stenosis of left middle cerebral artery (HCC)   THERAPY DIAG:  Other lack of coordination  Unsteadiness on feet  Other abnormalities of gait and mobility  Rationale for Evaluation and Treatment: Rehabilitation  SUBJECTIVE:                                                                                                                                                                                              SUBJECTIVE STATEMENT: Patient arrives to clinic for PT eval stating she thought she was here for speech therapy. Wanting to do speech therapy. Reports memory deficits and word finding deficits. Does report feeling "off balance sometimes." Agreeable to continue with eval.  Pt accompanied by: self  PERTINENT HISTORY: CVA, anxiety/depression, SBO, peritonitis, HTN   PAIN:  Are you having pain? No  PRECAUTIONS: Fall  WEIGHT BEARING RESTRICTIONS: No  FALLS: Has patient fallen in last 6 months? No  LIVING ENVIRONMENT: Lives with: lives with their family Lives in: House/apartment Stairs: Yes: External: 1 steps; none Has following equipment at home: None  PLOF: Independent  PATIENT GOALS: "to do speech therapy"  OBJECTIVE:   COGNITION: Overall cognitive status: History of cognitive impairments - at baseline   SENSATION: WFL  COORDINATION: WFL B LE figure 8 and heel/shin  POSTURE: No Significant postural limitations  LOWER EXTREMITY MMT:   Symmetrical and WFL  BED MOBILITY:  Reports no difficulty   TRANSFERS: Assistive device utilized: None  Sit to stand: Complete Independence Stand to sit: Complete Independence Chair to chair: Complete Independence  STAIRS: Level of Assistance: Modified independence Stair Negotiation Technique: Alternating Pattern  with Single Rail on Right Number of Stairs: 4  Height of Stairs: 6   GAIT: Gait pattern: step through pattern, decreased arm swing- Right, and decreased arm swing- Left Distance walked: clinic Assistive device utilized: None Level of assistance: Complete Independence  FUNCTIONAL TESTS:   [facility] PT Assessment - 09/03/22 0001       Standardized Balance Assessment   Standardized Balance Assessment Timed Up and Go Test    Five times sit to stand comments  11s no UE    10 Meter Walk 1.56ms      Timed Up and Go Test   Normal TUG (seconds) 7.36       Functional Gait  Assessment   Gait Level Surface Walks 20 ft in less than 5.5 sec, no assistive devices, good speed, no evidence for imbalance,  normal gait pattern, deviates no more than 6 in outside of the 12 in walkway width.    Change in Gait Speed Able to smoothly change walking speed without loss of balance or gait deviation. Deviate no more than 6 in outside of the 12 in walkway width.    Gait with Horizontal Head Turns Performs head turns smoothly with no change in gait. Deviates no more than 6 in outside 12 in walkway width    Gait with Vertical Head Turns Performs head turns with no change in gait. Deviates no more than 6 in outside 12 in walkway width.    Gait and Pivot Turn Pivot turns safely within 3 sec and stops quickly with no loss of balance.    Step Over Obstacle Is able to step over one shoe box (4.5 in total height) without changing gait speed. No evidence of imbalance.    Gait with Narrow Base of Support Ambulates 7-9 steps.    Gait with Eyes Closed Walks 20 ft, uses assistive device, slower speed, mild gait deviations, deviates 6-10 in outside 12 in walkway width. Ambulates 20 ft in less than 9 sec but greater than 7 sec.    Ambulating Backwards Walks 20 ft, uses assistive device, slower speed, mild gait deviations, deviates 6-10 in outside 12 in walkway width.    Steps Alternating feet, must use rail.    Total Score 25            M-CTSIB:  -condition 1: 30s no sway  -condition 2: 30s mild sway -condition 3: 30s mild sway  -condition 4: 30s mild sway    TODAY'S TREATMENT:                                                                                                                              N/A eval    PATIENT EDUCATION: Education details: PT POC, Exam findings, messaging dr re: ST referral  Person educated: Patient Education method: Explanation Education comprehension: verbalized understanding  HOME EXERCISE PROGRAM: None provided  GOALS: Goals not  needed as patient does not require skilled PT at this time  ASSESSMENT:  CLINICAL IMPRESSION: Patient is a 72 y.o. female who was seen today for physical therapy evaluation and treatment for deficits following CVA. Patient scoring WNL on TUG, 5xSTS, gait speed, FGA and M-CTSIB. Patient completed the Timed Up and Go test (TUG) in 7.36 seconds.  Geriatrics: need for further assessment of fall risk: ? 12 sec; Recurrent falls: > 15 sec; Vestibular Disorders fall risk: > 15 sec; Parkinson's Disease fall risk: > 16 sec ([email or web address], 2023). 10 Meter Walk Test: Patient instructed to walk 10 meters (32.8 ft) as quickly and as safely as possible at their normal speed x2 and at a fast speed x2. Time measured from 2 meter mark to 8 meter mark to accommodate ramp-up and ramp-down.  Normal speed: 1.35ms  Cut off scores: <0.4 m/s = household Ambulator, 0.4-0.8 m/s = limited  community Ambulator, >0.8 m/s = community Ambulator, >1.2 m/s = crossing a street, <1.0 = increased fall risk MCID 0.05 m/s (small), 0.13 m/s (moderate), 0.06 m/s (significant)  (ANPTA Core Set of Outcome Measures for Adults with Neurologic Conditions, 2018). Five times Sit to Stand Test (FTSS) Method: Use a straight back chair with a solid seat that is 17-18" high. Ask participant to sit on the chair with arms folded across their chest.   Instructions: "Stand up and sit down as quickly as possible 5 times, keeping your arms folded across your chest."   Measurement: Stop timing when the participant touches the chair in sitting the 5th time.  TIME: 11 sec  Cut off scores indicative of increased fall risk: >12 sec CVA, >16 sec PD, >13 sec vestibular (ANPTA Core Set of Outcome Measures for Adults with Neurologic Conditions, 2018). Patient completed and scored a 25/30 on  Functional Gait Assessment.   <22/30 = predictive of falls, <20/30 = fall in 6 months, <18/30 = predictive of falls in PD MCID: 5 points stroke population, 4 points  geriatric population (ANPTA Core Set of Outcome Measures for Adults with Neurologic Conditions, 2018). She does not require skilled PT services at this time.   CLINICAL DECISION MAKING: Stable/uncomplicated  EVALUATION COMPLEXITY: Low  PLAN:  PT FREQUENCY: one time visit  PT DURATION: other: 1x visit   Debbora Dus, PT Debbora Dus, PT, DPT, CBIS  09/03/2022, 1:03 PM

## 2022-09-07 ENCOUNTER — Ambulatory Visit (HOSPITAL_COMMUNITY): Payer: 59

## 2022-09-07 ENCOUNTER — Other Ambulatory Visit: Payer: Self-pay | Admitting: Internal Medicine

## 2022-09-07 ENCOUNTER — Telehealth (HOSPITAL_COMMUNITY): Payer: Self-pay | Admitting: *Deleted

## 2022-09-07 DIAGNOSIS — I63512 Cerebral infarction due to unspecified occlusion or stenosis of left middle cerebral artery: Secondary | ICD-10-CM

## 2022-09-07 NOTE — Telephone Encounter (Signed)
Pt in office today for GeneSight testing. Pt tolerated well and sample scheduled ot be picked up by [employer] this afternoon.

## 2022-09-11 ENCOUNTER — Other Ambulatory Visit: Payer: Self-pay | Admitting: Internal Medicine

## 2022-09-11 DIAGNOSIS — I63512 Cerebral infarction due to unspecified occlusion or stenosis of left middle cerebral artery: Secondary | ICD-10-CM

## 2022-09-13 ENCOUNTER — Ambulatory Visit (INDEPENDENT_AMBULATORY_CARE_PROVIDER_SITE_OTHER): Payer: 59 | Admitting: Psychologist

## 2022-09-13 DIAGNOSIS — F331 Major depressive disorder, recurrent, moderate: Secondary | ICD-10-CM | POA: Diagnosis not present

## 2022-09-13 NOTE — Progress Notes (Signed)
[facility] Counselor/Therapist Progress Note  Patient ID: Sonia Skinner, MRN: [id number],    Date: 09/13/2022  Time Spent: 02:04 pm to 02:34 pm; total time: 30 minutes   This session was held via in person. The patient consented to in-person therapy and was in the clinician's office. Limits of confidentiality were discussed with the patient  Treatment Type: Individual Therapy  Reported Symptoms: Difficulty with short temper  Mental Status Exam: Appearance:  Well Groomed     Behavior: Appropriate  Motor: Normal  Speech/Language:  Clear and Coherent  Affect: Appropriate  Mood: normal  Thought process: normal  Thought content:   WNL  Sensory/Perceptual disturbances:   WNL  Orientation: oriented to person, place, and time/date  Attention: Good  Concentration: Good  Memory:  Poor  Fund of knowledge:  Good  Insight:   Fair  Judgment:  Good  Impulse Control: Good   Risk Assessment: Danger to Self:  No Self-injurious Behavior: No Danger to Others: No Duty to Warn:no Physical Aggression / Violence:No  Access to Firearms a concern: No  Gang Involvement:No   Subjective: Beginning the session, patient described herself as okay and voiced lack of recall that we had met since she was discharged from the hospital. Per the patient, she was concerned about getting a swab test done and what it meant for her. She also was concerned about her memory. Patient did not want to talk about concerns related to her temper. She was agreeable to homework and following up. She denied suicidal and homicidal ideation.    Interventions:  Worked on developing a therapeutic relationship with the patient using active listening and reflective statements. Provided emotional support using empathy and validation. Reviewed the treatment plan with the patient. Reviewed events since the last session. Provided education to patient that she has met with clinician since being discharged previously from  the hospital. Processed if patient had implemented strategies discussed from the previous session. Provided education about cheek swab. Attempted to identify concerns patient expressed. Rolled with the resistance. Processed thoughts and emotions. Briefly provided education about using MMSE as screener if concerns about memory. Discussed next steps for counseling. Provided empathic statements. Assigned homework. Assessed for suicidal and homicidal ideation.   Homework: Write out a list of what she wants to focus on in therapy.  Next Session: Emotional support and review homework  Diagnosis: F33.1 major depressive affective disorder, recurrent, moderate  Plan:   Goals Alleviate depressive symptoms Recognize, accept, and cope with depressive feelings Develop healthy thinking patterns Develop healthy interpersonal relationships  Objectives target date for all objectives is 02/12/2023 Cooperate with a medication evaluation by a physician Verbalize an accurate understanding of depression Verbalize an understanding of the treatment Identify and replace thoughts that support depression Learn and implement behavioral strategies Verbalize an understanding and resolution of current interpersonal problems Learn and implement problem solving and decision making skills Learn and implement conflict resolution skills to resolve interpersonal problems Verbalize an understanding of healthy and unhealthy emotions verbalize insight into how past relationships may be influence current experiences with depression Use mindfulness and acceptance strategies and increase value based behavior  Increase hopeful statements about the future.  Interventions Consistent with treatment model, discuss how change in cognitive, behavioral, and interpersonal can help client alleviate depression CBT Behavioral activation help the client explore the relationship, nature of the dispute,  Help the client develop new  interpersonal skills and relationships Conduct Problem so living therapy Teach conflict resolution skills Use a process-experiential approach  Conduct TLDP Conduct ACT Evaluate need for psychotropic medication Monitor adherence to medication   The patient and clinician reviewed the treatment plan on 03/11/2022. The patient approved of the treatment plan.   Conception Chancy, PsyD

## 2022-09-18 ENCOUNTER — Other Ambulatory Visit: Payer: Self-pay | Admitting: Internal Medicine

## 2022-09-18 DIAGNOSIS — E118 Type 2 diabetes mellitus with unspecified complications: Secondary | ICD-10-CM

## 2022-09-18 DIAGNOSIS — E119 Type 2 diabetes mellitus without complications: Secondary | ICD-10-CM

## 2022-09-18 DIAGNOSIS — I10 Essential (primary) hypertension: Secondary | ICD-10-CM

## 2022-09-27 ENCOUNTER — Encounter (HOSPITAL_COMMUNITY): Payer: Self-pay | Admitting: Psychiatry

## 2022-09-27 ENCOUNTER — Telehealth (HOSPITAL_BASED_OUTPATIENT_CLINIC_OR_DEPARTMENT_OTHER): Payer: 59 | Admitting: Psychiatry

## 2022-09-27 VITALS — Wt 116.0 lb

## 2022-09-27 DIAGNOSIS — F411 Generalized anxiety disorder: Secondary | ICD-10-CM

## 2022-09-27 DIAGNOSIS — R413 Other amnesia: Secondary | ICD-10-CM

## 2022-09-27 DIAGNOSIS — F331 Major depressive disorder, recurrent, moderate: Secondary | ICD-10-CM | POA: Diagnosis not present

## 2022-09-27 MED ORDER — BUPROPION HCL ER (XL) 300 MG PO TB24: 300.0000 mg | ORAL_TABLET | Freq: Every day | ORAL | 1 refills | Status: DC

## 2022-09-27 MED ORDER — BREXPIPRAZOLE 1 MG PO TABS: 1.0000 mg | ORAL_TABLET | Freq: Every day | ORAL | 1 refills | Status: DC

## 2022-09-27 NOTE — Progress Notes (Signed)
Virtual Visit via Video Note  I connected with Sonia Skinner on 09/27/22 at 11:00 AM EST by a video enabled telemedicine application and verified that I am speaking with the correct person using two identifiers.  Location: Patient: Home Provider: Home Office   I discussed the limitations of evaluation and management by telemedicine and the availability of in person appointments. The patient expressed understanding and agreed to proceed.  History of Present Illness: Patient is evaluated by video session.  She is not comfortable on the camera but able to stay on camera for a few minutes.  She reported after stopping the REXULTI she has not noticed any improvement in her fatigue.  She remained very tired, fatigued and has no energy.  She like to go back on REXULTI.  We started 0.5 mg and after few weeks increase to 1 mg.  She stopped the [place] after noticed fatigue, abdominal pain.  She was diagnosed with SBO.  She also have anemia and diabetes.  Patient is very upset because she has not received on a regular basis her B12.  She received only 1 injection so far.  She is struggling with memory issues, chronic depression, chronic fatigue and neuropathy.  Now she will start soon speech therapy as neuro rehab is in the process.  She admitted some time forgetfulness and difficulty word finding but denies any crying spells or any feeling of hopelessness or worthlessness.  Patient also take care of her husband who has a stroke.  Patient is on Ozempic but admitted significant weight drop and now she is trying to cut down the Ozempic dose.  She is no longer taking Paxil after we started her on Wellbutrin.  She liked the Wellbutrin and so far tolerating and reported no tremor or shakes or any EPS.  She has nausea.  She lost another 5 pounds in 1 month.  She admitted some time frustration and irritability but no aggression or violence.  She denies any major panic attack.   Past Psychiatric History:  H/O taking  antidepressant on and off most of life. H/O overdose and inpatient at [place] provider at [facility] and given the Strattera, Adderall, Zoloft, Celexa, Lamictal and Lexapro.  Never tested for ADHD. No h/o psychosis, mania or hallucination.  In 2001 moved to [facility] and given amitriptyline and Wellbutrin to stop smoking. Tried Prozac and Paxil but mage tired and nausea.   Psychiatric Specialty Exam: Physical Exam  Review of Systems  Gastrointestinal:  Positive for abdominal pain and nausea.    Weight 116 lb (52.6 kg).There is no height or weight on file to calculate BMI.  General Appearance: Casual  Eye Contact:  Fair  Speech:  Slow  Volume:  Decreased  Mood:  Dysphoric  Affect:  Depressed  Thought Process:  Descriptions of Associations: Intact  Orientation:  Full (Time, Place, and Person)  Thought Content:  Rumination  Suicidal Thoughts:  No  Homicidal Thoughts:  No  Memory:  Immediate;   Fair Recent;   Fair Remote;   Fair  Judgement:  Intact  Insight:  Fair  Psychomotor Activity:  Decreased  Concentration:  Concentration: Fair and Attention Span: Fair  Recall:  [employer] of Knowledge:  Fair  Language:  Good  Akathisia:  No  Handed:  Right  AIMS (if indicated):     Assets:  Communication Skills Desire for Improvement Housing Transportation  ADL's:  Intact  Cognition:  WNL  Sleep:  Assessment and Plan: Major depressive disorder, recurrent.  Generalized anxiety disorder.  Memory changes.  Discuss her chronic issues especially she has not seen any improvement in her memory, fatigue, tiredness and not happy that she is not getting B12 injection on a regular basis.  She is hoping rehab may help her strength.  She will also require speech therapy.  She like to go back on REXULTI since she noticed that was not causing fatigue and abdominal pain.  I also discussed significant weight loss which could be due to Ozempic as patient cut down the  Ozempic.  I strongly encourage that her health issues should be considered by her PCP.  She has anemia, memory issues, neuropathy.  She like to have a message sent to her PCP.  I also reviewed genetic testing in detail with her.  Most of her medication has no interaction.  I also encourage should pick up the copy of the testing in our office and she agree that she will call us soon.  Her nausea and weight loss could be Ozempic.  Will start [place] again and first few days she will take 0.5 mg and after a few days she will take 1 mg.  Patient like to have prescription sent to local pharmacy.  Continue Wellbutrin XL 300 mg daily.  She is no longer taking Paxil.  Recommended to call us back if there is any question or any concern.  Follow-up in 2 months.  Follow Up Instructions:    I discussed the assessment and treatment plan with the patient. The patient was provided an opportunity to ask questions and all were answered. The patient agreed with the plan and demonstrated an understanding of the instructions.   The patient was advised to call back or seek an in-person evaluation if the symptoms worsen or if the condition fails to improve as anticipated.  Collaboration of Care: Other provider involved in patient's care AEB notes are available in epic to review.  I will forward my note to her PCP.  Patient/Guardian was advised Release of Information must be obtained prior to any record release in order to collaborate their care with an outside provider. Patient/Guardian was advised if they have not already done so to contact the registration department to sign all necessary forms in order for [place] to release information regarding their care.   Consent: Patient/Guardian gives verbal consent for treatment and assignment of benefits for services provided during this visit. Patient/Guardian expressed understanding and agreed to proceed.    I provided 26 minutes of non-face-to-face time during this  encounter.   Kathlee Nations, MD

## 2022-10-01 ENCOUNTER — Encounter: Payer: 59 | Admitting: Speech Pathology

## 2022-10-04 ENCOUNTER — Ambulatory Visit (INDEPENDENT_AMBULATORY_CARE_PROVIDER_SITE_OTHER): Payer: 59 | Admitting: Psychologist

## 2022-10-04 DIAGNOSIS — F331 Major depressive disorder, recurrent, moderate: Secondary | ICD-10-CM

## 2022-10-04 NOTE — Progress Notes (Signed)
[facility] Counselor/Therapist Progress Note  Patient ID: TAMEIA COPLIN, MRN: [id number],    Date: 10/04/2022  Time Spent: 03:07 pm to 03:45 pm; total time: 38 minutes   This session was held via in person. The patient consented to in-person therapy and was in the clinician's office. Limits of confidentiality were discussed with the patient  Treatment Type: Individual Therapy  Reported Symptoms: Concerns regarding anemia   Mental Status Exam: Appearance:  Well Groomed     Behavior: Appropriate  Motor: Normal  Speech/Language:  Clear and Coherent  Affect: Appropriate  Mood: normal  Thought process: normal  Thought content:   WNL  Sensory/Perceptual disturbances:   WNL  Orientation: oriented to person, place, and time/date  Attention: Good  Concentration: Good  Memory:  Poor  Fund of knowledge:  Good  Insight:   Fair  Judgment:  Good  Impulse Control: Good   Risk Assessment: Danger to Self:  No Self-injurious Behavior: No Danger to Others: No Duty to Warn:no Physical Aggression / Violence:No  Access to Firearms a concern: No  Gang Involvement:No   Subjective: Beginning the session, patient described herself as being irritated that her PCP is not addressing anemia. Patient stated that she plans to switch to have her endocrinologist become her PCP. Patient believes there is a medical explanation behind the anemia. Patient wanted to know if she was on the right track or if she was on the wrong track regarding her thought process. Patient voiced having a difficult time accepting that she might be getting older.  She was agreeable to following up. She denied suicidal and homicidal ideation.    Interventions:  Worked on developing a therapeutic relationship with the patient using active listening and reflective statements. Provided emotional support using empathy and validation. Reviewed the treatment plan with the patient. Processed events since the last session.  Identified goals for the session. Used socratic questions to assist the patient. Explored the specific concerns that the patient has regarding anemia. Challenged some of the thoughts expressed. Explored what type of response the patient is wanting to hear from the clinician. Processed thoughts and emotions. Challenged that some of the anemia could be medically related or that fatigue could be from aging or something else. Normalized and validated concerns. Explored what the patient wanted from the clinician. Provided empathic statements.  Assessed for suicidal and homicidal ideation.   Homework: Look to see if there is a medical explanation behind the anemia/fatigue experienced  Next Session: Emotional support and review homework  Diagnosis: F33.1 major depressive affective disorder, recurrent, moderate  Plan:   Goals Alleviate depressive symptoms Recognize, accept, and cope with depressive feelings Develop healthy thinking patterns Develop healthy interpersonal relationships  Objectives target date for all objectives is 02/12/2023 Cooperate with a medication evaluation by a physician Verbalize an accurate understanding of depression Verbalize an understanding of the treatment Identify and replace thoughts that support depression Learn and implement behavioral strategies Verbalize an understanding and resolution of current interpersonal problems Learn and implement problem solving and decision making skills Learn and implement conflict resolution skills to resolve interpersonal problems Verbalize an understanding of healthy and unhealthy emotions verbalize insight into how past relationships may be influence current experiences with depression Use mindfulness and acceptance strategies and increase value based behavior  Increase hopeful statements about the future.  Interventions Consistent with treatment model, discuss how change in cognitive, behavioral, and interpersonal can help client  alleviate depression CBT Behavioral activation help the client explore the relationship,  nature of the dispute,  Help the client develop new interpersonal skills and relationships Conduct Problem so living therapy Teach conflict resolution skills Use a process-experiential approach Conduct TLDP Conduct ACT Evaluate need for psychotropic medication Monitor adherence to medication   The patient and clinician reviewed the treatment plan on 03/11/2022. The patient approved of the treatment plan.   Conception Chancy, PsyD

## 2022-10-06 ENCOUNTER — Ambulatory Visit: Payer: 59 | Attending: Internal Medicine | Admitting: Speech Pathology

## 2022-10-06 DIAGNOSIS — R41841 Cognitive communication deficit: Secondary | ICD-10-CM | POA: Insufficient documentation

## 2022-10-06 NOTE — Therapy (Deleted)
OUTPATIENT SPEECH LANGUAGE PATHOLOGY EVALUATION   Patient Name: Sonia Skinner MRN: [id number] DOB:[birth year], 72 y.o., female Today's Date: 10/06/2022  PCP: [name], MD REFERRING PROVIDER: [name], MD  END OF SESSION:   Past Medical History:  Diagnosis Date   Ankle fracture    Left   Anxiety    Carotid artery occlusion    Closed fracture of left distal fibula [id number]   Complication of anesthesia    ALLERY TO ESTER BASE   Depression    early 85s   Diabetes mellitus    INSULIN DEPENDENT   GERD (gastroesophageal reflux disease)    Headache    years ago   Hypertension    Pneumonia    Stroke [facility]) March 2015    left MCA infarct, slight weakness on left side   Thyroid disease    Past Surgical History:  Procedure Laterality Date   ABDOMINAL HYSTERECTOMY     ANTERIOR FIXATION AND POSTERIOR MICRODISCECTOMY CERVICAL SPINE  1999   CAROTID ENDARTERECTOMY Left 11-04-13   cea   ENDARTERECTOMY Left 11/04/2013   IR ANGIO INTRA EXTRACRAN SEL COM CAROTID INNOMINATE BILAT MOD SED  11/16/2021   IR ANGIO INTRA EXTRACRAN SEL COM CAROTID INNOMINATE UNI L MOD SED  11/19/2021   IR ANGIO VERTEBRAL SEL VERTEBRAL BILAT MOD SED  11/16/2021   IR CT HEAD LTD  11/19/2021   IR INTRAVSC STENT CERV CAROTID W/EMB-PROT MOD SED INCL ANGIO  11/19/2021   IR RADIOLOGIST EVAL & MGMT  12/13/2021   ORIF ANKLE FRACTURE Left 09/16/2017   Procedure: OPEN REDUCTION INTERNAL FIXATION (ORIF) LEFT ANKLE FRACTURE;  Surgeon: Marchia Bond, MD;  Location: [place];  Service: Orthopedics;  Laterality: Left;   RADIOLOGY WITH ANESTHESIA N/A 11/19/2021   Procedure: Left carotid angioplasty with possible stenting;  Surgeon: Luanne Bras, MD;  Location: [place];  Service: Radiology;  Laterality: N/A;   Patient Active Problem List   Diagnosis Date Noted   Chronic idiopathic constipation 08/31/2022   Chronic renal disease, stage 4, severely decreased glomerular filtration rate (GFR) between 15-29 mL/min/1.73 square meter  ([place]) 06/08/2022   Chronic midline low back pain with bilateral sciatica 06/01/2022   Multinodular goiter 11/02/2021   Encounter for general adult medical examination with abnormal findings 04/08/2021   Visit for screening mammogram 04/01/2020   Type 2 diabetes mellitus with complication, with long-term current use of insulin ([place]) 09/18/2018   Age-related osteoporosis with current pathological fracture 02/20/2018   Intractable migraine without aura and with status migrainosus 11/24/2016   GERD (gastroesophageal reflux disease) 03/08/2016   Major depressive disorder, recurrent episode ([place]) 02/20/2016   Occlusion and stenosis of carotid artery with cerebral infarction 11/25/2013   Cerebrovascular accident (CVA) due to stenosis of left middle cerebral artery ([place]) 11/02/2013   Vitamin D deficiency 10/19/2013   Tobacco abuse 07/31/2013   Essential hypertension, benign 04/20/2013   Type II diabetes mellitus with manifestations ([place]) 04/20/2013   Hypothyroidism 04/20/2013   Hyperlipidemia with target LDL less than 70 04/20/2013    ONSET DATE: 11-13-2021; referral 09-11-2022  REFERRING DIAG: [id number] (ICD-10-CM) - Cerebrovascular accident (CVA) due to stenosis of left middle cerebral artery ([place])   THERAPY DIAG:  No diagnosis found.  Rationale for Evaluation and Treatment: {HABREHAB:27488}  SUBJECTIVE:   SUBJECTIVE STATEMENT: *** Pt accompanied by: {accompnied:27141}  PERTINENT HISTORY: PMH CVA, anxiety/depression, SBO, peritonitis, HTN. Known to this clinic for prior ST course which targeted cognition, word finding.   PAIN:  Are you having pain? {OPRCPAIN:27236}  FALLS: Has patient fallen in last 6 months?  No  LIVING ENVIRONMENT: Lives with: lives with their family Lives in: House/apartment  PLOF:  Level of assistance: Independent with ADLs, Independent with IADLs Employment: {SLPemployment:25674}  PATIENT GOALS: ***  OBJECTIVE:  COGNITION: Overall cognitive status:  {cognition:24006} Areas of impairment:  {cognitiveimpairmentslp:27409} Functional deficits: ***  COGNITIVE COMMUNICATION: Following directions: {commands:24018}  Auditory comprehension: {WFL-Impaired:25365} Verbal expression: {WFL-Impaired:25365} Functional communication: {WFL-Impaired:25365}  ORAL MOTOR EXAMINATION: Overall status: {OMESLP2:27645} Comments: ***  STANDARDIZED ASSESSMENTS: {SLPstandardizedassessment:27092}  PATIENT REPORTED OUTCOME MEASURES (PROM): {SLPPROM:27095}   TODAY'S TREATMENT: 10-06-2022: ***   PATIENT EDUCATION: Education details: *** Person educated: {Person educated:25204} Education method: {Education Method:25205} Education comprehension: {Education Comprehension:25206}   GOALS: Goals reviewed with patient? Yes  SHORT TERM GOALS: Target date: ***  *** Baseline: Goal status: {GOALSTATUS:25110}  2.  *** Baseline:  Goal status: {GOALSTATUS:25110}  3.  *** Baseline:  Goal status: {GOALSTATUS:25110}  4.  *** Baseline:  Goal status: {GOALSTATUS:25110}  5.  *** Baseline:  Goal status: {GOALSTATUS:25110}  6.  *** Baseline:  Goal status: {GOALSTATUS:25110}  LONG TERM GOALS: Target date: ***  *** Baseline:  Goal status: {GOALSTATUS:25110}  2.  *** Baseline:  Goal status: {GOALSTATUS:25110}  3.  *** Baseline:  Goal status: {GOALSTATUS:25110}  4.  *** Baseline:  Goal status: {GOALSTATUS:25110}  5.  *** Baseline:  Goal status: {GOALSTATUS:25110}  6.  *** Baseline:  Goal status: {GOALSTATUS:25110}  ASSESSMENT:  CLINICAL IMPRESSION: Patient is a *** y.o. *** who was seen today for ***.   OBJECTIVE IMPAIRMENTS: include {SLPOBJIMP:27107}. These impairments are limiting patient from {SLPLIMIT:27108}. Factors affecting potential to achieve goals and functional outcome are {SLP factors:25450}.. Patient will benefit from skilled SLP services to address above impairments and improve overall function.  REHAB  POTENTIAL: {rehabpotential:25112}  PLAN:  SLP FREQUENCY: {rehab frequency:25116}  SLP DURATION: {rehab duration:25117}  PLANNED INTERVENTIONS: {SLP treatment/interventions:25449}    Su Monks, CCC-SLP 10/06/2022, 8:07 AM

## 2022-10-08 ENCOUNTER — Ambulatory Visit: Payer: 59 | Admitting: Speech Pathology

## 2022-10-08 ENCOUNTER — Encounter: Payer: Self-pay | Admitting: Speech Pathology

## 2022-10-08 DIAGNOSIS — R41841 Cognitive communication deficit: Secondary | ICD-10-CM

## 2022-10-08 NOTE — Therapy (Signed)
OUTPATIENT SPEECH LANGUAGE PATHOLOGY EVALUATION   Patient Name: Sonia Skinner MRN: [id number] DOB:22-Oct-1950, 72 y.o., female Today's Date: 10/08/2022  PCP: [name], MD REFERRING PROVIDER: [name], MD  END OF SESSION:  End of Session - 10/08/22 1403     Visit Number 1    Number of Visits 9    Date for SLP Re-Evaluation 12/03/22    Authorization Type [employer] Medicare    SLP Start Time 1400    SLP Stop Time  [id number]    SLP Time Calculation (min) 45 min    Activity Tolerance Patient tolerated treatment well             Past Medical History:  Diagnosis Date   Ankle fracture    Left   Anxiety    Carotid artery occlusion    Closed fracture of left distal fibula [id number]   Complication of anesthesia    ALLERY TO ESTER BASE   Depression    early 46s   Diabetes mellitus    INSULIN DEPENDENT   GERD (gastroesophageal reflux disease)    Headache    years ago   Hypertension    Pneumonia    Stroke [facility]) March 2015    left MCA infarct, slight weakness on left side   Thyroid disease    Past Surgical History:  Procedure Laterality Date   ABDOMINAL HYSTERECTOMY     ANTERIOR FIXATION AND POSTERIOR MICRODISCECTOMY CERVICAL SPINE  1999   CAROTID ENDARTERECTOMY Left 11-04-13   cea   ENDARTERECTOMY Left 11/04/2013   IR ANGIO INTRA EXTRACRAN SEL COM CAROTID INNOMINATE BILAT MOD SED  11/16/2021   IR ANGIO INTRA EXTRACRAN SEL COM CAROTID INNOMINATE UNI L MOD SED  11/19/2021   IR ANGIO VERTEBRAL SEL VERTEBRAL BILAT MOD SED  11/16/2021   IR CT HEAD LTD  11/19/2021   IR INTRAVSC STENT CERV CAROTID W/EMB-PROT MOD SED INCL ANGIO  11/19/2021   IR RADIOLOGIST EVAL & MGMT  12/13/2021   ORIF ANKLE FRACTURE Left 09/16/2017   Procedure: OPEN REDUCTION INTERNAL FIXATION (ORIF) LEFT ANKLE FRACTURE;  Surgeon: Marchia Bond, MD;  Location: [place];  Service: Orthopedics;  Laterality: Left;   RADIOLOGY WITH ANESTHESIA N/A 11/19/2021   Procedure: Left carotid angioplasty with possible stenting;  Surgeon:  Luanne Bras, MD;  Location: [place];  Service: Radiology;  Laterality: N/A;   Patient Active Problem List   Diagnosis Date Noted   Chronic idiopathic constipation 08/31/2022   Chronic renal disease, stage 4, severely decreased glomerular filtration rate (GFR) between 15-29 mL/min/1.73 square meter ([place]) 06/08/2022   Chronic midline low back pain with bilateral sciatica 06/01/2022   Multinodular goiter 11/02/2021   Encounter for general adult medical examination with abnormal findings 04/08/2021   Visit for screening mammogram 04/01/2020   Type 2 diabetes mellitus with complication, with long-term current use of insulin ([place]) 09/18/2018   Age-related osteoporosis with current pathological fracture 02/20/2018   Intractable migraine without aura and with status migrainosus 11/24/2016   GERD (gastroesophageal reflux disease) 03/08/2016   Major depressive disorder, recurrent episode ([place]) 02/20/2016   Occlusion and stenosis of carotid artery with cerebral infarction 11/25/2013   Cerebrovascular accident (CVA) due to stenosis of left middle cerebral artery ([place]) 11/02/2013   Vitamin D deficiency 10/19/2013   Tobacco abuse 07/31/2013   Essential hypertension, benign 04/20/2013   Type II diabetes mellitus with manifestations ([place]) 04/20/2013   Hypothyroidism 04/20/2013   Hyperlipidemia with target LDL less than 70 04/20/2013    ONSET  DATE: Referral 09-11-22   REFERRING DIAG: I63.512 (ICD-10-CM) - Cerebrovascular accident (CVA) due to stenosis of left middle cerebral artery ([facility])   THERAPY DIAG:  Cognitive communication deficit  Rationale for Evaluation and Treatment: Rehabilitation  SUBJECTIVE:   SUBJECTIVE STATEMENT: "I can still tell a difference in my recall" Pt accompanied by: self  PERTINENT HISTORY: Sonia Skinner is a 72 year old female with past medical history significant for type 2 diabetes mellitus, essential hypertension, ICA stenosis s/p left carotid endarterectomy 2015,  CKD stage IIIb, GERD, history of CVA. Presented to ED on 11/13/21 with a visual changes described as "vision white out", feeling of tongue thickening, gait disturbance and speech impairment. Speech impairment reported lasting about 30 minutes.  Additionally with fatigue and dizziness which prompted her to seek care in the ED. In the ED, CT head without contrast with no acute intracranial findings, noted old infarct left parietal cortex unchanged. Neurology was consulted.  Patient was transferred to [facility] for further stroke versus TIA work-up. On admission, MR brain no acute infarct, chronic left MCA infarct.  MRA head/neck 70% stenosis proximal cervical left ICA. She underwent  EEG with no seizures or epileptiform discharges. She underwent cerebral angiograph with left ICA stent placement on 11/19/21 which was well tolerated.     PAIN:  Are you having pain? No pain reported  FALLS: Has patient fallen in last 6 months?  See PT evaluation for details  LIVING ENVIRONMENT: Lives with: lives with their family Lives in: House/apartment  PLOF:  Level of assistance: Independent with ADLs, Independent with IADLs Employment: Retired  PATIENT GOALS: To establish a daily schedule, work on recall strategies  OBJECTIVE:   DIAGNOSTIC FINDINGS: n/a  COGNITION: Overall cognitive status: Impaired Areas of impairment:  Attention: Impaired: Selective, Alternating, Divided Memory: Impaired: Working TEFL teacher function: Impaired: Initiation, Problem solving, Organization, and Planning Functional deficits: Unable to recall names  COGNITIVE COMMUNICATION: Following directions: WFL Auditory comprehension: WFL Verbal expression: Impaired: Mild anomia Functional communication: Overall WFL throughout eval, able to participate in conversation. Does inform SLP when anomia occurs, which is marked in slight delay. Per pt report, during moments of anomia pt employs intentional circumlocution  and avoids situations d/t embarrassment associated with word-finding, such as church functions.   STANDARDIZED ASSESSMENTS: SLUMS: 21/30 (Mild neurocognitive disorder) and [place] NAMING TEST: 53/60 (Mild, 55.2 WFL) Pt able to generate correct words when anomia occurred, given semantic cues  x5, phonemic cues x3   PATIENT REPORTED OUTCOME MEASURES (PROM): Deferred   TODAY'S TREATMENT:                                                                                                                                         DATE:   10-08-22: SLP re-educated pt on anomia compensations and strategies. Collaborated with pt to establish POC and goals.    PATIENT EDUCATION: Education details: see above Person educated:  Patient Education method: Explanation Education comprehension: verbalized understanding and needs further education   GOALS: Goals reviewed with patient? Yes  SHORT TERM GOALS: Target date: 11-05-22  Pt will develop external aid to support orientation and schedule management.  Baseline: Goal status: INITIAL  2.  Pt will teach back memory strategies and provide 1 functional scenario in which each could be used. Baseline:  Goal status: INITIAL  3.  Pt will demonstrate completion of 4 HEP activities for word-finding.  Baseline:  Goal status: INITIAL  LONG TERM GOALS: Target date: 12-03-22  Pt will report implementation of scheduling management tool with perceived benefit.  Baseline:  Goal status: INITIAL  2.  Pt will demonstrate use of self-selected memory strategy resulting in accurate recall of information in roleplay scenario with 80% accuracy.  Baseline:  Goal status: INITIAL  3.  Pt will demonstrate completion of 4 additional HEP activities for word-finding.  Baseline:  Goal status: INITIAL  4.  Pt will report HEP compliance per self-directed schedule over one week period.  Baseline:  Goal status: INITIAL  ASSESSMENT:  CLINICAL IMPRESSION: Patient is a 72  y.o. female who was seen today for cognitive-linguistic evaluation. Pt presents with mild cognitive deficits in domains of attention, memory, and executive function. Pt reports difficulty managing finances and her schedule. Pt scored within mild range on SLUMS. Pt's language mildly impaired evidenced by below normal limits in naming on [place] Naming Test. Pt reports occasional anomia throughout conversation, which was marked by brief pause. Pt reporting frustration with anomia. SLP educates on recs to implement robust HEP to maintain and potentially improve language skills. Skilled ST is indicated to target cognition and verbal expression to decrease frustration.   OBJECTIVE IMPAIRMENTS: include attention, memory, and executive functioning. These impairments are limiting patient from managing appointments, managing finances, effectively communicating at home and in community, and participating in socialization . Factors affecting potential to achieve goals and functional outcome are  time since onset . Patient will benefit from skilled SLP services to address above impairments and improve overall function.  REHAB POTENTIAL: Fair Reduced task initiation   PLAN:  SLP FREQUENCY: 1-2x/week  SLP DURATION: 6 weeks  PLANNED INTERVENTIONS: Language facilitation, Cognitive reorganization, Internal/external aids, Functional tasks, SLP instruction and feedback, Compensatory strategies, and Patient/family education    Leroy Libman, Student-SLP 10/08/2022, 3:16 PM

## 2022-10-13 ENCOUNTER — Ambulatory Visit: Payer: 59 | Admitting: Speech Pathology

## 2022-10-13 DIAGNOSIS — R41841 Cognitive communication deficit: Secondary | ICD-10-CM

## 2022-10-13 NOTE — Therapy (Unsigned)
OUTPATIENT SPEECH LANGUAGE PATHOLOGY EVALUATION   Patient Name: Sonia Skinner MRN: [id number] DOB:[birth year], 72 y.o., female Today's Date: 10/14/2022  PCP: [name], MD REFERRING PROVIDER: [name], MD  END OF SESSION:  End of Session - 10/13/22 1518     Visit Number 2    Number of Visits 9    Date for SLP Re-Evaluation 12/03/22    Authorization Type [employer] Medicare    SLP Start Time 76    SLP Stop Time  70    SLP Time Calculation (min) 45 min    Activity Tolerance Patient tolerated treatment well              Past Medical History:  Diagnosis Date   Ankle fracture    Left   Anxiety    Carotid artery occlusion    Closed fracture of left distal fibula [id number]   Complication of anesthesia    ALLERY TO ESTER BASE   Depression    early 63s   Diabetes mellitus    INSULIN DEPENDENT   GERD (gastroesophageal reflux disease)    Headache    years ago   Hypertension    Pneumonia    Stroke [facility] March 2015    left MCA infarct, slight weakness on left side   Thyroid disease    Past Surgical History:  Procedure Laterality Date   ABDOMINAL HYSTERECTOMY     ANTERIOR FIXATION AND POSTERIOR MICRODISCECTOMY CERVICAL SPINE  1999   CAROTID ENDARTERECTOMY Left 11-04-13   cea   ENDARTERECTOMY Left 11/04/2013   IR ANGIO INTRA EXTRACRAN SEL COM CAROTID INNOMINATE BILAT MOD SED  11/16/2021   IR ANGIO INTRA EXTRACRAN SEL COM CAROTID INNOMINATE UNI L MOD SED  11/19/2021   IR ANGIO VERTEBRAL SEL VERTEBRAL BILAT MOD SED  11/16/2021   IR CT HEAD LTD  11/19/2021   IR INTRAVSC STENT CERV CAROTID W/EMB-PROT MOD SED INCL ANGIO  11/19/2021   IR RADIOLOGIST EVAL & MGMT  12/13/2021   ORIF ANKLE FRACTURE Left 09/16/2017   Procedure: OPEN REDUCTION INTERNAL FIXATION (ORIF) LEFT ANKLE FRACTURE;  Surgeon: Marchia Bond, MD;  Location: [place];  Service: Orthopedics;  Laterality: Left;   RADIOLOGY WITH ANESTHESIA N/A 11/19/2021   Procedure: Left carotid angioplasty with possible stenting;   Surgeon: Luanne Bras, MD;  Location: [place];  Service: Radiology;  Laterality: N/A;   Patient Active Problem List   Diagnosis Date Noted   Chronic idiopathic constipation 08/31/2022   Chronic renal disease, stage 4, severely decreased glomerular filtration rate (GFR) between 15-29 mL/min/1.73 square meter ([place]) 06/08/2022   Chronic midline low back pain with bilateral sciatica 06/01/2022   Multinodular goiter 11/02/2021   Encounter for general adult medical examination with abnormal findings 04/08/2021   Visit for screening mammogram 04/01/2020   Type 2 diabetes mellitus with complication, with long-term current use of insulin ([place]) 09/18/2018   Age-related osteoporosis with current pathological fracture 02/20/2018   Intractable migraine without aura and with status migrainosus 11/24/2016   GERD (gastroesophageal reflux disease) 03/08/2016   Major depressive disorder, recurrent episode ([place]) 02/20/2016   Occlusion and stenosis of carotid artery with cerebral infarction 11/25/2013   Cerebrovascular accident (CVA) due to stenosis of left middle cerebral artery ([place]) 11/02/2013   Vitamin D deficiency 10/19/2013   Tobacco abuse 07/31/2013   Essential hypertension, benign 04/20/2013   Type II diabetes mellitus with manifestations ([place]) 04/20/2013   Hypothyroidism 04/20/2013   Hyperlipidemia with target LDL less than 70 04/20/2013  ONSET DATE: Referral 09-11-22   REFERRING DIAG: [id number] (ICD-10-CM) - Cerebrovascular accident (CVA) due to stenosis of left middle cerebral artery (HCC)   THERAPY DIAG:  Cognitive communication deficit  Rationale for Evaluation and Treatment: Rehabilitation  SUBJECTIVE:   SUBJECTIVE STATEMENT: "I need to work on a schedule to take my medications" Pt accompanied by: self  PERTINENT HISTORY: Sonia Skinner is a 72 year old female with past medical history significant for type 2 diabetes mellitus, essential hypertension, ICA stenosis s/p left carotid  endarterectomy 2015, CKD stage IIIb, GERD, history of CVA. Presented to ED on 11/13/21 with a visual changes described as "vision white out", feeling of tongue thickening, gait disturbance and speech impairment. Speech impairment reported lasting about 30 minutes.  Additionally with fatigue and dizziness which prompted her to seek care in the ED. In the ED, CT head without contrast with no acute intracranial findings, noted old infarct left parietal cortex unchanged. Neurology was consulted.  Patient was transferred to [facility] for further stroke versus TIA work-up. On admission, MR brain no acute infarct, chronic left MCA infarct.  MRA head/neck 70% stenosis proximal cervical left ICA. She underwent  EEG with no seizures or epileptiform discharges. She underwent cerebral angiograph with left ICA stent placement on 11/19/21 which was well tolerated.     PAIN:  Are you having pain? No pain reported  FALLS: Has patient fallen in last 6 months?  See PT evaluation for details  LIVING ENVIRONMENT: Lives with: lives with their family Lives in: House/apartment  PLOF:  Level of assistance: Independent with ADLs, Independent with IADLs Employment: Retired  PATIENT GOALS: To establish a daily schedule, work on recall strategies  OBJECTIVE:   TODAY'S TREATMENT:                                                                                                                                         DATE:   10-13-22: Pt requests assistance for establishing reminders or routine to aid in medication schedule adherence. She forgot to take her acid reflex med today. SLP provided A to pt to create reminders on her phone and change settings to allow alarms. Education provided on supportive memory strategies of creating routine and not delaying when alert goes off. Provided min-A, pt added 5 medication reminders. SLP assisted in changing settings to enable sound notification.  To support establishment of  robust HEP for expressive language skills, clinician introduced Pharmacist, hospital ([facility]) and educated pt on benefits, principles, and implementation. With mod-I, pt generated six complete sentences for one verb and answered why, when, and where the action would occur. Provided handout of aphasia HEP and handouts to support use of [facility] in HEP routine. Provides suggestions to modify to increase complexity as pt demonstrated completion with SLP with relative ease and was noted to provide numerous creative responses. Pt to report back on effectiveness of medication  reminders.  Pt reported "sometimes I talk too much" and that she would like to work on improving this. Will target next session and practice strategies to reduce verbosity.   10-08-22: SLP re-educated pt on anomia compensations and strategies. Collaborated with pt to establish POC and goals.    PATIENT EDUCATION: Education details: see above Person educated: Patient Education method: Explanation Education comprehension: verbalized understanding and needs further education   GOALS: Goals reviewed with patient? Yes  SHORT TERM GOALS: Target date: 11-05-22  Pt will develop external aid to support orientation and schedule management.  Baseline: Goal status: IN PROGRESS  2.  Pt will teach back memory strategies and provide 1 functional scenario in which each could be used. Baseline:  Goal status: IN PROGRESS  3.  Pt will demonstrate completion of 4 HEP activities for word-finding.  Baseline:  Goal status: IN PROGRESS  LONG TERM GOALS: Target date: 12-03-22  Pt will report implementation of scheduling management tool with perceived benefit.  Baseline:  Goal status: IN PROGRESS  2.  Pt will demonstrate use of self-selected memory strategy resulting in accurate recall of information in roleplay scenario with 80% accuracy.  Baseline:  Goal status: IN PROGRESS  3.  Pt will demonstrate completion of 4 additional  HEP activities for word-finding.  Baseline:  Goal status: IN PROGRESS  4.  Pt will report HEP compliance per self-directed schedule over one week period.  Baseline:  Goal status: IN PROGRESS  ASSESSMENT:  CLINICAL IMPRESSION: Patient is a 72 y.o. female who was seen today for cognitive-linguistic evaluation. Pt presents with mild cognitive deficits in domains of attention, memory, and executive function. Pt reports difficulty managing finances and her schedule. Pt scored within mild range on SLUMS. Pt's language mildly impaired evidenced by below normal limits in naming on [place] Naming Test. Pt reports occasional anomia throughout conversation, which was marked by brief pause. Pt reporting frustration with anomia. SLP educates on recs to implement robust HEP to maintain and potentially improve language skills. Skilled ST is indicated to target cognition and verbal expression to decrease frustration.   OBJECTIVE IMPAIRMENTS: include attention, memory, and executive functioning. These impairments are limiting patient from managing appointments, managing finances, effectively communicating at home and in community, and participating in socialization . Factors affecting potential to achieve goals and functional outcome are  time since onset . Patient will benefit from skilled SLP services to address above impairments and improve overall function.  REHAB POTENTIAL: Fair Reduced task initiation   PLAN:  SLP FREQUENCY: 1-2x/week  SLP DURATION: 6 weeks  PLANNED INTERVENTIONS: Language facilitation, Cognitive reorganization, Internal/external aids, Functional tasks, SLP instruction and feedback, Compensatory strategies, and Patient/family education    Su Monks, CCC-SLP 10/14/2022, 7:35 AM

## 2022-10-13 NOTE — Patient Instructions (Addendum)
Keep log of memory mistakes - What kinds of things are you forgetting?  Improving your language skills: Daily practice that focuses on challenging your language system Schedule a practice session into your daily routine Determine how long you will practice and when Choose from an activity that we've taught you  I will practice for _____ (how long) minutes a day at __________ (when).  I will remember to practice by ___________________ (setting reminder, adding to to do list, scheduling on calendar, having family member remind you)   Activities:  Lawyer Use the outline we provided to guide your practice Pick 3-5 verbs  Get creative and think of 3 different who [verb] what for each verb Answer the when, why, where questions if you can for each sentence you made   For example: verb= EAT   I eat cake    Why? To celebrate    When? On my birthday    Where? At the party   My dog (name) eat [employer] dog food    Why? To live, because he's hungry    When? 2x a day, morning and night    Where? In the kitchen  2.    Semantic Feature Analysis Pick 5 objects (can be photos, things in your environment, or just thought up) Use the visual aid you've been given to guide your practice Try to completely describe the selected object using the prompts on your chart  *REMEMBER* Describing is a great tool for when you can't think of a word in conversation. This practice can help build your word inventory but can also help you be able to describe when you are having a conversation and cannot think of the word you're looking for!!  3.   Picture Description Find pictures you can use. Pick 5 for your practice on this day.   Some ideas are:  personal albums Cell phone photos Magazines [employer]  Social media  Describe as completely as you can.   Who, what, where, when, why, how  What does it make you think of?  What memories do you have of this event?  Do  you have any opinions about what you see?   This is an activity that may be fun to do with someone else. Share your ideas!  4.   Object Naming Go around the rooms in your home and name each object you see.  Go outside and name what you see. Open drawers, cabinets, closets and name the items you see. Look around and name items that begin with a certain letter. Look around and name items that are a certain color.  Find a photo and name as many objects as you can find  5.   Positive Attributes List as many people as you can from a category: Family Friends church members Singers movie stars TV actors Characters on a show Public relations account executive co-workers, Then generate one positive attribute to describe each person on your list. Be creative and make sure each positive quality is unique!  6.   New York Times: What's Going on in this Picture https://www.nytimes.com/column/learning-whats-going-on-in-this-picture  Use the photo to create a short story that could go with the photo.  Check back later to see what the real story is. Did you get anything right?   6.   Journaling Writing can be a wonderful way to expand your language skills Writing provides to opportunity to be careful with your word choice and think of creative ways to say  what you're thinking  Here are some ways to journal: Pick a general prompt that you can answer every day What I did today What is on my mind Google daily journal prompt to find something creative Buy a journal Mining engineer, Tax adviser and [employer], Social research officer, government) with pre-prescribed prompts  Buy a memory book with prompts to organize your life story 'This Life of Mine: [place] for [employer], Parents and Anyone to [employer] Memories, Moments & Milestones' Grandparent-Grandchild Writing Kit Charity fundraiser  My life in a book Start a scrapbook Pick meaningful photos Put 1-3 photos on a page (if more than one, they should be related) Jot down your memories sparked  by the image(s)   7.   Verb A Day Choose a verb for that day Come up with 10 phrases that use the verb Keep the verb in your mind all day, revisiting it whenever you get a chance Try to use this verb as much as possible when talking to other people or describing your actions as you move throughout your day After dinner, review your verb and see if it feels more familiar since the morning  8.   Play Pictionary (can buy the game or just play informally)  Player One draws something on a piece of paper, and Player Two has to guess what it is.  For example, if the word you're thinking of is "birthday," you may draw a cake with candles, party balloons, or a calendar.  Keep switching off so you get a chance to draw and you get a chance guess the answers.   9.   Plan a trip Choose a place that you've always wanted to visit Look up pictures on the Internet, or use Tripadvisor to create a list of sites you'd like to see If you have time, write the alphabet down the left side of a piece of paper, finding one element of this place for each letter For example, if the place you want to go to is [place], you may have the [employer] for "E" or subway ride for "S."   10.  Poetry Reading Poetry is musical in nature, so it is sometimes easier to read a short poem than it is to read a short article. A great poet for often funny and sometimes brief works is Chief Executive Officer. He uses rhymes and repetition to create a rhythm in his poems. His poetry is also suitable for kids, so his work is the perfect poetry to practice in front of grandchildren. Set up a Zoom call with a friend or family member and read them a poem today.   Think about your opinions on the poem. What did it make you think or feel? Do you have an opinion on the poem?   59.  Summarize watch/listen to podcast, show, movie, sermon, TED talk, audiobook, etc write or speak a brief summary  12.  Speaking Crosswords Completing crossword  puzzles is a great way to practice speech and word finding because they're often a little challenging. Filling out a crossword puzzle means playing with words. You can take your crossword puzzle to the next level by speaking your crossword puzzle. In other words, read aloud each clue before you try to solve it. The New York Times makes mini-puzzles that they post online and in books. Try to fill one out every day while speaking the clues aloud.  Mini puzzles from NYT can be found on their website, on the NYT Games app, or there are  books of them (NYT store or [employer])   13.  Get social post on social media interact with another's social media post send a few friends an email or text message

## 2022-10-17 ENCOUNTER — Other Ambulatory Visit (HOSPITAL_COMMUNITY): Payer: Self-pay | Admitting: Psychiatry

## 2022-10-17 DIAGNOSIS — F331 Major depressive disorder, recurrent, moderate: Secondary | ICD-10-CM

## 2022-10-17 DIAGNOSIS — F411 Generalized anxiety disorder: Secondary | ICD-10-CM

## 2022-10-20 ENCOUNTER — Ambulatory Visit: Payer: 59 | Admitting: Speech Pathology

## 2022-10-20 NOTE — Therapy (Deleted)
OUTPATIENT SPEECH LANGUAGE PATHOLOGY EVALUATION   Patient Name: Sonia Skinner MRN: [id number] DOB:11-Apr-1951, 72 y.o., female Today's Date: 10/20/2022  PCP: [name], MD REFERRING PROVIDER: [name], MD  END OF SESSION:     Past Medical History:  Diagnosis Date   Ankle fracture    Left   Anxiety    Carotid artery occlusion    Closed fracture of left distal fibula [id number]   Complication of anesthesia    ALLERY TO ESTER BASE   Depression    early 32s   Diabetes mellitus    INSULIN DEPENDENT   GERD (gastroesophageal reflux disease)    Headache    years ago   Hypertension    Pneumonia    Stroke [facility]) March 2015    left MCA infarct, slight weakness on left side   Thyroid disease    Past Surgical History:  Procedure Laterality Date   ABDOMINAL HYSTERECTOMY     ANTERIOR FIXATION AND POSTERIOR MICRODISCECTOMY CERVICAL SPINE  1999   CAROTID ENDARTERECTOMY Left 11-04-13   cea   ENDARTERECTOMY Left 11/04/2013   IR ANGIO INTRA EXTRACRAN SEL COM CAROTID INNOMINATE BILAT MOD SED  11/16/2021   IR ANGIO INTRA EXTRACRAN SEL COM CAROTID INNOMINATE UNI L MOD SED  11/19/2021   IR ANGIO VERTEBRAL SEL VERTEBRAL BILAT MOD SED  11/16/2021   IR CT HEAD LTD  11/19/2021   IR INTRAVSC STENT CERV CAROTID W/EMB-PROT MOD SED INCL ANGIO  11/19/2021   IR RADIOLOGIST EVAL & MGMT  12/13/2021   ORIF ANKLE FRACTURE Left 09/16/2017   Procedure: OPEN REDUCTION INTERNAL FIXATION (ORIF) LEFT ANKLE FRACTURE;  Surgeon: Marchia Bond, MD;  Location: [place];  Service: Orthopedics;  Laterality: Left;   RADIOLOGY WITH ANESTHESIA N/A 11/19/2021   Procedure: Left carotid angioplasty with possible stenting;  Surgeon: Luanne Bras, MD;  Location: [place];  Service: Radiology;  Laterality: N/A;   Patient Active Problem List   Diagnosis Date Noted   Chronic idiopathic constipation 08/31/2022   Chronic renal disease, stage 4, severely decreased glomerular filtration rate (GFR) between 15-29 mL/min/1.73 square  meter ([place]) 06/08/2022   Chronic midline low back pain with bilateral sciatica 06/01/2022   Multinodular goiter 11/02/2021   Encounter for general adult medical examination with abnormal findings 04/08/2021   Visit for screening mammogram 04/01/2020   Type 2 diabetes mellitus with complication, with long-term current use of insulin ([place]) 09/18/2018   Age-related osteoporosis with current pathological fracture 02/20/2018   Intractable migraine without aura and with status migrainosus 11/24/2016   GERD (gastroesophageal reflux disease) 03/08/2016   Major depressive disorder, recurrent episode ([place]) 02/20/2016   Occlusion and stenosis of carotid artery with cerebral infarction 11/25/2013   Cerebrovascular accident (CVA) due to stenosis of left middle cerebral artery ([place]) 11/02/2013   Vitamin D deficiency 10/19/2013   Tobacco abuse 07/31/2013   Essential hypertension, benign 04/20/2013   Type II diabetes mellitus with manifestations ([place]) 04/20/2013   Hypothyroidism 04/20/2013   Hyperlipidemia with target LDL less than 70 04/20/2013    ONSET DATE: Referral 09-11-22   REFERRING DIAG: [id number] (ICD-10-CM) - Cerebrovascular accident (CVA) due to stenosis of left middle cerebral artery ([place])   THERAPY DIAG:  No diagnosis found.  Rationale for Evaluation and Treatment: Rehabilitation  SUBJECTIVE:   SUBJECTIVE STATEMENT: *** Pt accompanied by: self  PERTINENT HISTORY: Sonia Skinner is a 72 year old female with past medical history significant for type 2 diabetes mellitus, essential hypertension, ICA stenosis s/p left carotid endarterectomy 2015,  CKD stage IIIb, GERD, history of CVA. Presented to ED on 11/13/21 with a visual changes described as "vision white out", feeling of tongue thickening, gait disturbance and speech impairment. Speech impairment reported lasting about 30 minutes.  Additionally with fatigue and dizziness which prompted her to seek care in the ED. In the ED, CT head without  contrast with no acute intracranial findings, noted old infarct left parietal cortex unchanged. Neurology was consulted.  Patient was transferred to [facility] for further stroke versus TIA work-up. On admission, MR brain no acute infarct, chronic left MCA infarct.  MRA head/neck 70% stenosis proximal cervical left ICA. She underwent  EEG with no seizures or epileptiform discharges. She underwent cerebral angiograph with left ICA stent placement on 11/19/21 which was well tolerated.     PAIN:  Are you having pain? No pain reported  FALLS: Has patient fallen in last 6 months?  See PT evaluation for details  LIVING ENVIRONMENT: Lives with: lives with their family Lives in: House/apartment  PLOF:  Level of assistance: Independent with ADLs, Independent with IADLs Employment: Retired  PATIENT GOALS: To establish a daily schedule, work on recall strategies  OBJECTIVE:   TODAY'S TREATMENT:                                                                                                                                         DATE:   10-20-22:  10-13-22: Pt requests assistance for establishing reminders or routine to aid in medication schedule adherence. She forgot to take her acid reflex med today. SLP provided A to pt to create reminders on her phone and change settings to allow alarms. Education provided on supportive memory strategies of creating routine and not delaying when alert goes off. Provided min-A, pt added 5 medication reminders. SLP assisted in changing settings to enable sound notification.  To support establishment of robust HEP for expressive language skills, clinician introduced Pharmacist, hospital ([facility]) and educated pt on benefits, principles, and implementation. With mod-I, pt generated six complete sentences for one verb and answered why, when, and where the action would occur. Provided handout of aphasia HEP and handouts to support use of [facility] in HEP  routine. Provides suggestions to modify to increase complexity as pt demonstrated completion with SLP with relative ease and was noted to provide numerous creative responses. Pt to report back on effectiveness of medication reminders.  Pt reported "sometimes I talk too much" and that she would like to work on improving this. Will target next session and practice strategies to reduce verbosity.   10-08-22: SLP re-educated pt on anomia compensations and strategies. Collaborated with pt to establish POC and goals.    PATIENT EDUCATION: Education details: see above Person educated: Patient Education method: Explanation Education comprehension: verbalized understanding and needs further education   GOALS: Goals reviewed with patient? Yes  SHORT TERM GOALS: Target date: 11-05-22  Pt will develop external aid to support orientation and schedule management.  Baseline: Goal status: IN PROGRESS  2.  Pt will teach back memory strategies and provide 1 functional scenario in which each could be used. Baseline:  Goal status: IN PROGRESS  3.  Pt will demonstrate completion of 4 HEP activities for word-finding.  Baseline:  Goal status: IN PROGRESS  LONG TERM GOALS: Target date: 12-03-22  Pt will report implementation of scheduling management tool with perceived benefit.  Baseline:  Goal status: IN PROGRESS  2.  Pt will demonstrate use of self-selected memory strategy resulting in accurate recall of information in roleplay scenario with 80% accuracy.  Baseline:  Goal status: IN PROGRESS  3.  Pt will demonstrate completion of 4 additional HEP activities for word-finding.  Baseline:  Goal status: IN PROGRESS  4.  Pt will report HEP compliance per self-directed schedule over one week period.  Baseline:  Goal status: IN PROGRESS  ASSESSMENT:  CLINICAL IMPRESSION: Patient is a 72 y.o. female who was seen today for cognitive-linguistic evaluation. Pt presents with mild cognitive deficits in  domains of attention, memory, and executive function. Pt reports difficulty managing finances and her schedule. Pt scored within mild range on SLUMS. Pt's language mildly impaired evidenced by below normal limits in naming on [place] Naming Test. Pt reports occasional anomia throughout conversation, which was marked by brief pause. Pt reporting frustration with anomia. SLP educates on recs to implement robust HEP to maintain and potentially improve language skills. Skilled ST is indicated to target cognition and verbal expression to decrease frustration.   OBJECTIVE IMPAIRMENTS: include attention, memory, and executive functioning. These impairments are limiting patient from managing appointments, managing finances, effectively communicating at home and in community, and participating in socialization . Factors affecting potential to achieve goals and functional outcome are  time since onset . Patient will benefit from skilled SLP services to address above impairments and improve overall function.  REHAB POTENTIAL: Fair Reduced task initiation   PLAN:  SLP FREQUENCY: 1-2x/week  SLP DURATION: 6 weeks  PLANNED INTERVENTIONS: Language facilitation, Cognitive reorganization, Internal/external aids, Functional tasks, SLP instruction and feedback, Compensatory strategies, and Patient/family education    Leroy Libman, Student-SLP 10/20/2022, 8:25 AM

## 2022-10-22 ENCOUNTER — Ambulatory Visit: Payer: 59 | Attending: Internal Medicine | Admitting: Speech Pathology

## 2022-10-22 DIAGNOSIS — R41841 Cognitive communication deficit: Secondary | ICD-10-CM | POA: Diagnosis present

## 2022-10-22 NOTE — Therapy (Signed)
OUTPATIENT SPEECH LANGUAGE PATHOLOGY EVALUATION   Patient Name: Sonia Skinner MRN: [id number] DOB:02/05/1951, 72 y.o., female Today's Date: 10/22/2022  PCP: [name], MD REFERRING PROVIDER: [name], MD  END OF SESSION:  End of Session - 10/22/22 1401     Visit Number 3    Number of Visits 9    Date for SLP Re-Evaluation 12/03/22    Authorization Type [employer] Medicare    SLP Start Time 1401    SLP Stop Time  1445    SLP Time Calculation (min) 44 min    Activity Tolerance Patient tolerated treatment well               Past Medical History:  Diagnosis Date   Ankle fracture    Left   Anxiety    Carotid artery occlusion    Closed fracture of left distal fibula [id number]   Complication of anesthesia    ALLERY TO ESTER BASE   Depression    early 26s   Diabetes mellitus    INSULIN DEPENDENT   GERD (gastroesophageal reflux disease)    Headache    years ago   Hypertension    Pneumonia    Stroke [facility]) March 2015    left MCA infarct, slight weakness on left side   Thyroid disease    Past Surgical History:  Procedure Laterality Date   ABDOMINAL HYSTERECTOMY     ANTERIOR FIXATION AND POSTERIOR MICRODISCECTOMY CERVICAL SPINE  1999   CAROTID ENDARTERECTOMY Left 11-04-13   cea   ENDARTERECTOMY Left 11/04/2013   IR ANGIO INTRA EXTRACRAN SEL COM CAROTID INNOMINATE BILAT MOD SED  11/16/2021   IR ANGIO INTRA EXTRACRAN SEL COM CAROTID INNOMINATE UNI L MOD SED  11/19/2021   IR ANGIO VERTEBRAL SEL VERTEBRAL BILAT MOD SED  11/16/2021   IR CT HEAD LTD  11/19/2021   IR INTRAVSC STENT CERV CAROTID W/EMB-PROT MOD SED INCL ANGIO  11/19/2021   IR RADIOLOGIST EVAL & MGMT  12/13/2021   ORIF ANKLE FRACTURE Left 09/16/2017   Procedure: OPEN REDUCTION INTERNAL FIXATION (ORIF) LEFT ANKLE FRACTURE;  Surgeon: Marchia Bond, MD;  Location: [place];  Service: Orthopedics;  Laterality: Left;   RADIOLOGY WITH ANESTHESIA N/A 11/19/2021   Procedure: Left carotid angioplasty with possible stenting;   Surgeon: Luanne Bras, MD;  Location: [place];  Service: Radiology;  Laterality: N/A;   Patient Active Problem List   Diagnosis Date Noted   Chronic idiopathic constipation 08/31/2022   Chronic renal disease, stage 4, severely decreased glomerular filtration rate (GFR) between 15-29 mL/min/1.73 square meter ([place]) 06/08/2022   Chronic midline low back pain with bilateral sciatica 06/01/2022   Multinodular goiter 11/02/2021   Encounter for general adult medical examination with abnormal findings 04/08/2021   Visit for screening mammogram 04/01/2020   Type 2 diabetes mellitus with complication, with long-term current use of insulin ([place]) 09/18/2018   Age-related osteoporosis with current pathological fracture 02/20/2018   Intractable migraine without aura and with status migrainosus 11/24/2016   GERD (gastroesophageal reflux disease) 03/08/2016   Major depressive disorder, recurrent episode ([place]) 02/20/2016   Occlusion and stenosis of carotid artery with cerebral infarction 11/25/2013   Cerebrovascular accident (CVA) due to stenosis of left middle cerebral artery ([place]) 11/02/2013   Vitamin D deficiency 10/19/2013   Tobacco abuse 07/31/2013   Essential hypertension, benign 04/20/2013   Type II diabetes mellitus with manifestations ([place]) 04/20/2013   Hypothyroidism 04/20/2013   Hyperlipidemia with target LDL less than 70 04/20/2013  ONSET DATE: Referral 09-11-22   REFERRING DIAG: [id number] (ICD-10-CM) - Cerebrovascular accident (CVA) due to stenosis of left middle cerebral artery ([facility])   THERAPY DIAG:  Cognitive communication deficit  Rationale for Evaluation and Treatment: Rehabilitation  SUBJECTIVE:   SUBJECTIVE STATEMENT: "I'm okay" Pt accompanied by: self  PERTINENT HISTORY: Sonia Skinner is a 72 year old female with past medical history significant for type 2 diabetes mellitus, essential hypertension, ICA stenosis s/p left carotid endarterectomy 2015, CKD stage IIIb, GERD,  history of CVA. Presented to ED on 11/13/21 with a visual changes described as "vision white out", feeling of tongue thickening, gait disturbance and speech impairment. Speech impairment reported lasting about 30 minutes.  Additionally with fatigue and dizziness which prompted her to seek care in the ED. In the ED, CT head without contrast with no acute intracranial findings, noted old infarct left parietal cortex unchanged. Neurology was consulted.  Patient was transferred to [facility] for further stroke versus TIA work-up. On admission, MR brain no acute infarct, chronic left MCA infarct.  MRA head/neck 70% stenosis proximal cervical left ICA. She underwent  EEG with no seizures or epileptiform discharges. She underwent cerebral angiograph with left ICA stent placement on 11/19/21 which was well tolerated.     PAIN:  Are you having pain? No pain reported  FALLS: Has patient fallen in last 6 months?  See PT evaluation for details  LIVING ENVIRONMENT: Lives with: lives with their family Lives in: House/apartment  PLOF:  Level of assistance: Independent with ADLs, Independent with IADLs Employment: Retired  PATIENT GOALS: To establish a daily schedule, work on recall strategies  OBJECTIVE:   TODAY'S TREATMENT:                                                                                                                                         DATE:   10-22-22: Pt reported that she filled out a memory mistake log. X2 memory mistakes ID, both with difficulty recalling name of a movie. SLP educated on reason why these kinds of information are challenging to recall. This appears typical. Targeted cohesiveness of verbal express at pt's request. SLP provided on strategies on how to regulate conversation and decrease verbosity, such as turn-taking and topic maintenance. Facilitated discussion brining awareness to verbosity and communication partner's body language.  SLP provided assistance to pt  to alter alarm on her phone because she reported that she continues to forget to take her reflux medication. Encouraged pt to keep her phone nearby as frequently as possible and develop a habit to check to see if she has missed reminders. Pt endorsed that the medication reminders have been helpful overall.   10-13-22: Pt requests assistance for establishing reminders or routine to aid in medication schedule adherence. She forgot to take her acid reflex med today. SLP provided A to pt to create reminders on her phone and change settings  to allow alarms. Education provided on supportive memory strategies of creating routine and not delaying when alert goes off. Provided min-A, pt added 5 medication reminders. SLP assisted in changing settings to enable sound notification.  To support establishment of robust HEP for expressive language skills, clinician introduced Pharmacist, hospital ([facility]) and educated pt on benefits, principles, and implementation. With mod-I, pt generated six complete sentences for one verb and answered why, when, and where the action would occur. Provided handout of aphasia HEP and handouts to support use of [facility] in HEP routine. Provides suggestions to modify to increase complexity as pt demonstrated completion with SLP with relative ease and was noted to provide numerous creative responses. Pt to report back on effectiveness of medication reminders.  Pt reported "sometimes I talk too much" and that she would like to work on improving this. Will target next session and practice strategies to reduce verbosity.   10-08-22: SLP re-educated pt on anomia compensations and strategies. Collaborated with pt to establish POC and goals.    PATIENT EDUCATION: Education details: see above Person educated: Patient Education method: Explanation Education comprehension: verbalized understanding and needs further education   GOALS: Goals reviewed with patient? Yes  SHORT TERM  GOALS: Target date: 11-05-22  Pt will develop external aid to support orientation and schedule management.  Baseline: Goal status: IN PROGRESS  2.  Pt will teach back memory strategies and provide 1 functional scenario in which each could be used. Baseline:  Goal status: IN PROGRESS  3.  Pt will demonstrate completion of 4 HEP activities for word-finding.  Baseline:  Goal status: IN PROGRESS  LONG TERM GOALS: Target date: 12-03-22  Pt will report implementation of scheduling management tool with perceived benefit.  Baseline:  Goal status: IN PROGRESS  2.  Pt will demonstrate use of self-selected memory strategy resulting in accurate recall of information in roleplay scenario with 80% accuracy.  Baseline:  Goal status: IN PROGRESS  3.  Pt will demonstrate completion of 4 additional HEP activities for word-finding.  Baseline:  Goal status: IN PROGRESS  4.  Pt will report HEP compliance per self-directed schedule over one week period.  Baseline:  Goal status: IN PROGRESS  ASSESSMENT:  CLINICAL IMPRESSION: Patient is a 72 y.o. female who was seen today for cognitive-linguistic evaluation. Pt presents with mild cognitive deficits in domains of attention, memory, and executive function. Pt reports difficulty managing finances and her schedule. Pt scored within mild range on SLUMS. Pt's language mildly impaired evidenced by below normal limits in naming on [place] Naming Test. Pt reports occasional anomia throughout conversation, which was marked by brief pause. Pt reporting frustration with anomia. SLP educates on recs to implement robust HEP to maintain and potentially improve language skills. Skilled ST is indicated to target cognition and verbal expression to decrease frustration.   OBJECTIVE IMPAIRMENTS: include attention, memory, and executive functioning. These impairments are limiting patient from managing appointments, managing finances, effectively communicating at home and in  community, and participating in socialization . Factors affecting potential to achieve goals and functional outcome are  time since onset . Patient will benefit from skilled SLP services to address above impairments and improve overall function.  REHAB POTENTIAL: Fair Reduced task initiation   PLAN:  SLP FREQUENCY: 1-2x/week  SLP DURATION: 6 weeks  PLANNED INTERVENTIONS: Language facilitation, Cognitive reorganization, Internal/external aids, Functional tasks, SLP instruction and feedback, Compensatory strategies, and Patient/family education    Leroy Libman, [place] 10/22/2022, 2:03 PM

## 2022-10-25 ENCOUNTER — Ambulatory Visit: Payer: 59 | Admitting: Psychologist

## 2022-10-26 DIAGNOSIS — I639 Cerebral infarction, unspecified: Secondary | ICD-10-CM | POA: Diagnosis not present

## 2022-10-26 DIAGNOSIS — E785 Hyperlipidemia, unspecified: Secondary | ICD-10-CM | POA: Diagnosis not present

## 2022-10-26 DIAGNOSIS — N184 Chronic kidney disease, stage 4 (severe): Secondary | ICD-10-CM | POA: Diagnosis not present

## 2022-10-26 DIAGNOSIS — E1122 Type 2 diabetes mellitus with diabetic chronic kidney disease: Secondary | ICD-10-CM | POA: Diagnosis not present

## 2022-10-26 DIAGNOSIS — E039 Hypothyroidism, unspecified: Secondary | ICD-10-CM | POA: Diagnosis not present

## 2022-10-26 DIAGNOSIS — N1831 Chronic kidney disease, stage 3a: Secondary | ICD-10-CM | POA: Diagnosis not present

## 2022-10-27 ENCOUNTER — Ambulatory Visit: Payer: 59 | Admitting: Speech Pathology

## 2022-11-02 DIAGNOSIS — N1831 Chronic kidney disease, stage 3a: Secondary | ICD-10-CM | POA: Diagnosis not present

## 2022-11-02 LAB — LAB REPORT - SCANNED
Albumin, Urine POC: 4.4
Creatinine, POC: 156.9 mg/dL
EGFR: 23
Microalb Creat Ratio: 3
Microalb Creat Ratio: 53

## 2022-11-03 ENCOUNTER — Ambulatory Visit: Payer: 59 | Admitting: Speech Pathology

## 2022-11-03 DIAGNOSIS — R41841 Cognitive communication deficit: Secondary | ICD-10-CM

## 2022-11-03 NOTE — Patient Instructions (Signed)
[email or web address] -- FOR CLINICAL OPPORTUNITIES   Clear and concise information:  Take a moment to PLAN what you want to say  Give yourself a limit (e.g. 3-5 sentences) Build your awareness - this starts with retrospective awareness but with more focus, you can get better and ID when it happens and then change how you're communicating PRACTICE!   Your goal is to be CLEAR and CONCISE.

## 2022-11-04 NOTE — Therapy (Addendum)
OUTPATIENT SPEECH LANGUAGE PATHOLOGY TREATMENT   Patient Name: Sonia Skinner MRN: [id number] DOB:June 26, 1951, 72 y.o., female Today's Date: 11/04/2022  PCP: [name], MD REFERRING PROVIDER: [name], MD  END OF SESSION:  End of Session - 11/03/22 1529     Visit Number 4    Number of Visits 9    Date for SLP Re-Evaluation 12/03/22    Authorization Type [employer]    SLP Start Time 1445    SLP Stop Time  [id number]    SLP Time Calculation (min) 45 min    Activity Tolerance Patient tolerated treatment well               Past Medical History:  Diagnosis Date   Ankle fracture    Left   Anxiety    Carotid artery occlusion    Closed fracture of left distal fibula [id number]   Complication of anesthesia    ALLERY TO ESTER BASE   Depression    early 56s   Diabetes mellitus    INSULIN DEPENDENT   GERD (gastroesophageal reflux disease)    Headache    years ago   Hypertension    Pneumonia    Stroke [facility]) March 2015    left MCA infarct, slight weakness on left side   Thyroid disease    Past Surgical History:  Procedure Laterality Date   ABDOMINAL HYSTERECTOMY     ANTERIOR FIXATION AND POSTERIOR MICRODISCECTOMY CERVICAL SPINE  1999   CAROTID ENDARTERECTOMY Left 11-04-13   cea   ENDARTERECTOMY Left 11/04/2013   IR ANGIO INTRA EXTRACRAN SEL COM CAROTID INNOMINATE BILAT MOD SED  11/16/2021   IR ANGIO INTRA EXTRACRAN SEL COM CAROTID INNOMINATE UNI L MOD SED  11/19/2021   IR ANGIO VERTEBRAL SEL VERTEBRAL BILAT MOD SED  11/16/2021   IR CT HEAD LTD  11/19/2021   IR INTRAVSC STENT CERV CAROTID W/EMB-PROT MOD SED INCL ANGIO  11/19/2021   IR RADIOLOGIST EVAL & MGMT  12/13/2021   ORIF ANKLE FRACTURE Left 09/16/2017   Procedure: OPEN REDUCTION INTERNAL FIXATION (ORIF) LEFT ANKLE FRACTURE;  Surgeon: Marchia Bond, MD;  Location: [place];  Service: Orthopedics;  Laterality: Left;   RADIOLOGY WITH ANESTHESIA N/A 11/19/2021   Procedure: Left carotid angioplasty with possible stenting;   Surgeon: Luanne Bras, MD;  Location: [place];  Service: Radiology;  Laterality: N/A;   Patient Active Problem List   Diagnosis Date Noted   Chronic idiopathic constipation 08/31/2022   Chronic renal disease, stage 4, severely decreased glomerular filtration rate (GFR) between 15-29 mL/min/1.73 square meter ([place]) 06/08/2022   Chronic midline low back pain with bilateral sciatica 06/01/2022   Multinodular goiter 11/02/2021   Encounter for general adult medical examination with abnormal findings 04/08/2021   Visit for screening mammogram 04/01/2020   Type 2 diabetes mellitus with complication, with long-term current use of insulin ([place]) 09/18/2018   Age-related osteoporosis with current pathological fracture 02/20/2018   Intractable migraine without aura and with status migrainosus 11/24/2016   GERD (gastroesophageal reflux disease) 03/08/2016   Major depressive disorder, recurrent episode ([place]) 02/20/2016   Occlusion and stenosis of carotid artery with cerebral infarction 11/25/2013   Cerebrovascular accident (CVA) due to stenosis of left middle cerebral artery ([place]) 11/02/2013   Vitamin D deficiency 10/19/2013   Tobacco abuse 07/31/2013   Essential hypertension, benign 04/20/2013   Type II diabetes mellitus with manifestations ([place]) 04/20/2013   Hypothyroidism 04/20/2013   Hyperlipidemia with target LDL less than 70 04/20/2013  ONSET DATE: Referral 09-11-22   REFERRING DIAG: [id number] (ICD-10-CM) - Cerebrovascular accident (CVA) due to stenosis of left middle cerebral artery (HCC)   THERAPY DIAG:  Cognitive communication deficit  Rationale for Evaluation and Treatment: Rehabilitation  SUBJECTIVE:   SUBJECTIVE STATEMENT: "I'm doing okay" Pt accompanied by: self  PERTINENT HISTORY: Sonia Skinner is a 72 year old female with past medical history significant for type 2 diabetes mellitus, essential hypertension, ICA stenosis s/p left carotid endarterectomy 2015, CKD stage IIIb,  GERD, history of CVA. Presented to ED on 11/13/21 with a visual changes described as "vision white out", feeling of tongue thickening, gait disturbance and speech impairment. Speech impairment reported lasting about 30 minutes.  Additionally with fatigue and dizziness which prompted her to seek care in the ED. In the ED, CT head without contrast with no acute intracranial findings, noted old infarct left parietal cortex unchanged. Neurology was consulted.  Patient was transferred to [facility] for further stroke versus TIA work-up. On admission, MR brain no acute infarct, chronic left MCA infarct.  MRA head/neck 70% stenosis proximal cervical left ICA. She underwent  EEG with no seizures or epileptiform discharges. She underwent cerebral angiograph with left ICA stent placement on 11/19/21 which was well tolerated.     PAIN:  Are you having pain? No pain reported  FALLS: Has patient fallen in last 6 months?  See PT evaluation for details  LIVING ENVIRONMENT: Lives with: lives with their family Lives in: House/apartment  PLOF:  Level of assistance: Independent with ADLs, Independent with IADLs Employment: Retired  PATIENT GOALS: To establish a daily schedule, work on recall strategies  OBJECTIVE:   TODAY'S TREATMENT:                                                                                                                                         DATE:   11-03-22: Pt elected to work on turn-taking and topic maintenance during conversation. SLP educated pt on attention strategies to implement during conversation to reduce verbosity and increase conciseness of utterances. During structured conversational task and given visual aid, pt able to summarize TED Talk videos within 2-3 sentences. Demonstrated most difficulty with expressing her opinion of the videos succinctly, despite consistent visual cues. HEP: Choose one or two familiar conversation partners with who to practice conversation  strategies and report back.   10-22-22: Pt reported that she filled out a memory mistake log. X2 memory mistakes ID, both with difficulty recalling name of a movie. SLP educated on reason why these kinds of information are challenging to recall. This appears typical. Targeted cohesiveness of verbal express at pt's request. SLP provided on strategies on how to regulate conversation and decrease verbosity, such as turn-taking and topic maintenance. Facilitated discussion brining awareness to verbosity and communication partner's body language.  SLP provided assistance to pt to alter alarm on her phone because she reported that  she continues to forget to take her reflux medication. Encouraged pt to keep her phone nearby as frequently as possible and develop a habit to check to see if she has missed reminders. Pt endorsed that the medication reminders have been helpful overall.   10-13-22: Pt requests assistance for establishing reminders or routine to aid in medication schedule adherence. She forgot to take her acid reflex med today. SLP provided A to pt to create reminders on her phone and change settings to allow alarms. Education provided on supportive memory strategies of creating routine and not delaying when alert goes off. Provided min-A, pt added 5 medication reminders. SLP assisted in changing settings to enable sound notification.  To support establishment of robust HEP for expressive language skills, clinician introduced Pharmacist, hospital ([facility]) and educated pt on benefits, principles, and implementation. With mod-I, pt generated six complete sentences for one verb and answered why, when, and where the action would occur. Provided handout of aphasia HEP and handouts to support use of [facility] in HEP routine. Provides suggestions to modify to increase complexity as pt demonstrated completion with SLP with relative ease and was noted to provide numerous creative responses. Pt to report back  on effectiveness of medication reminders.  Pt reported "sometimes I talk too much" and that she would like to work on improving this. Will target next session and practice strategies to reduce verbosity.   10-08-22: SLP re-educated pt on anomia compensations and strategies. Collaborated with pt to establish POC and goals.    PATIENT EDUCATION: Education details: see above Person educated: Patient Education method: Explanation Education comprehension: verbalized understanding and needs further education   GOALS: Goals reviewed with patient? Yes  SHORT TERM GOALS: Target date: 11-05-22  Pt will develop external aid to support orientation and schedule management.  Baseline: Goal status: MET  2.  Pt will teach back memory strategies and provide 1 functional scenario in which each could be used. Baseline:  Goal status: MET  3.  Pt will demonstrate completion of 4 HEP activities for word-finding.  Baseline:  Goal status: IN PROGRESS  LONG TERM GOALS: Target date: 12-03-22  Pt will report implementation of scheduling management tool with perceived benefit.  Baseline:  Goal status: IN PROGRESS  2.  Pt will demonstrate use of self-selected memory strategy resulting in accurate recall of information in roleplay scenario with 80% accuracy.  Baseline:  Goal status: IN PROGRESS  3.  Pt will demonstrate completion of 4 additional HEP activities for word-finding.  Baseline:  Goal status: IN PROGRESS  4.  Pt will report HEP compliance per self-directed schedule over one week period.  Baseline:  Goal status: IN PROGRESS  ASSESSMENT:  CLINICAL IMPRESSION: Patient is a 72 y.o. female who was seen today for cognitive-linguistic treatment. Pt continues to endorse occasional anomia during conversation. Requests to work on topic maintenance and turn-taking skills during conversation. SLP leads pt through structured conversational task and reviews attention strategies to support pt success.  Skilled ST is indicated to target cognition and verbal expression to decrease frustration.   OBJECTIVE IMPAIRMENTS: include attention, memory, and executive functioning. These impairments are limiting patient from managing appointments, managing finances, effectively communicating at home and in community, and participating in socialization . Factors affecting potential to achieve goals and functional outcome are  time since onset . Patient will benefit from skilled SLP services to address above impairments and improve overall function.  REHAB POTENTIAL: Fair Reduced task initiation   PLAN:  SLP FREQUENCY:  1-2x/week  SLP DURATION: 6 weeks  PLANNED INTERVENTIONS: Language facilitation, Cognitive reorganization, Internal/external aids, Functional tasks, SLP instruction and feedback, Compensatory strategies, and Patient/family education    Leroy Libman, Student-SLP 11/04/2022, 8:47 AM

## 2022-11-10 ENCOUNTER — Ambulatory Visit: Payer: 59 | Admitting: Speech Pathology

## 2022-11-10 NOTE — Therapy (Deleted)
OUTPATIENT SPEECH LANGUAGE PATHOLOGY TREATMENT   Patient Name: Sonia Skinner MRN: [id number] DOB:1950-09-08, 72 y.o., female Today's Date: 11/10/2022  PCP: [name], MD REFERRING PROVIDER: [name], MD  END OF SESSION:      Past Medical History:  Diagnosis Date   Ankle fracture    Left   Anxiety    Carotid artery occlusion    Closed fracture of left distal fibula [id number]   Complication of anesthesia    ALLERY TO ESTER BASE   Depression    early 97s   Diabetes mellitus    INSULIN DEPENDENT   GERD (gastroesophageal reflux disease)    Headache    years ago   Hypertension    Pneumonia    Stroke [facility]) March 2015    left MCA infarct, slight weakness on left side   Thyroid disease    Past Surgical History:  Procedure Laterality Date   ABDOMINAL HYSTERECTOMY     ANTERIOR FIXATION AND POSTERIOR MICRODISCECTOMY CERVICAL SPINE  1999   CAROTID ENDARTERECTOMY Left 11-04-13   cea   ENDARTERECTOMY Left 11/04/2013   IR ANGIO INTRA EXTRACRAN SEL COM CAROTID INNOMINATE BILAT MOD SED  11/16/2021   IR ANGIO INTRA EXTRACRAN SEL COM CAROTID INNOMINATE UNI L MOD SED  11/19/2021   IR ANGIO VERTEBRAL SEL VERTEBRAL BILAT MOD SED  11/16/2021   IR CT HEAD LTD  11/19/2021   IR INTRAVSC STENT CERV CAROTID W/EMB-PROT MOD SED INCL ANGIO  11/19/2021   IR RADIOLOGIST EVAL & MGMT  12/13/2021   ORIF ANKLE FRACTURE Left 09/16/2017   Procedure: OPEN REDUCTION INTERNAL FIXATION (ORIF) LEFT ANKLE FRACTURE;  Surgeon: Marchia Bond, MD;  Location: [place];  Service: Orthopedics;  Laterality: Left;   RADIOLOGY WITH ANESTHESIA N/A 11/19/2021   Procedure: Left carotid angioplasty with possible stenting;  Surgeon: Luanne Bras, MD;  Location: [place];  Service: Radiology;  Laterality: N/A;   Patient Active Problem List   Diagnosis Date Noted   Chronic idiopathic constipation 08/31/2022   Chronic renal disease, stage 4, severely decreased glomerular filtration rate (GFR) between 15-29 mL/min/1.73 square  meter ([place]) 06/08/2022   Chronic midline low back pain with bilateral sciatica 06/01/2022   Multinodular goiter 11/02/2021   Encounter for general adult medical examination with abnormal findings 04/08/2021   Visit for screening mammogram 04/01/2020   Type 2 diabetes mellitus with complication, with long-term current use of insulin ([place]) 09/18/2018   Age-related osteoporosis with current pathological fracture 02/20/2018   Intractable migraine without aura and with status migrainosus 11/24/2016   GERD (gastroesophageal reflux disease) 03/08/2016   Major depressive disorder, recurrent episode ([place]) 02/20/2016   Occlusion and stenosis of carotid artery with cerebral infarction 11/25/2013   Cerebrovascular accident (CVA) due to stenosis of left middle cerebral artery ([place]) 11/02/2013   Vitamin D deficiency 10/19/2013   Tobacco abuse 07/31/2013   Essential hypertension, benign 04/20/2013   Type II diabetes mellitus with manifestations ([place]) 04/20/2013   Hypothyroidism 04/20/2013   Hyperlipidemia with target LDL less than 70 04/20/2013    ONSET DATE: Referral 09-11-22   REFERRING DIAG: [id number] (ICD-10-CM) - Cerebrovascular accident (CVA) due to stenosis of left middle cerebral artery ([place])   THERAPY DIAG:  No diagnosis found.  Rationale for Evaluation and Treatment: Rehabilitation  SUBJECTIVE:   SUBJECTIVE STATEMENT: *** Pt accompanied by: self  PERTINENT HISTORY: Sonia Skinner is a 72 year old female with past medical history significant for type 2 diabetes mellitus, essential hypertension, ICA stenosis s/p left carotid endarterectomy  2015, CKD stage IIIb, GERD, history of CVA. Presented to ED on 11/13/21 with a visual changes described as "vision white out", feeling of tongue thickening, gait disturbance and speech impairment. Speech impairment reported lasting about 30 minutes.  Additionally with fatigue and dizziness which prompted her to seek care in the ED. In the ED, CT head without  contrast with no acute intracranial findings, noted old infarct left parietal cortex unchanged. Neurology was consulted.  Patient was transferred to [facility] for further stroke versus TIA work-up. On admission, MR brain no acute infarct, chronic left MCA infarct.  MRA head/neck 70% stenosis proximal cervical left ICA. She underwent  EEG with no seizures or epileptiform discharges. She underwent cerebral angiograph with left ICA stent placement on 11/19/21 which was well tolerated.     PAIN:  Are you having pain? No pain reported  FALLS: Has patient fallen in last 6 months?  See PT evaluation for details  LIVING ENVIRONMENT: Lives with: lives with their family Lives in: House/apartment  PLOF:  Level of assistance: Independent with ADLs, Independent with IADLs Employment: Retired  PATIENT GOALS: To establish a daily schedule, work on recall strategies  OBJECTIVE:   TODAY'S TREATMENT:                                                                                                                                         DATE:   11-10-22:  11-03-22: Pt elected to work on turn-taking and topic maintenance during conversation. SLP educated pt on attention strategies to implement during conversation to reduce verbosity and increase conciseness of utterances. During structured conversational task and given visual aid, pt able to summarize TED Talk videos within 2-3 sentences. Demonstrated most difficulty with expressing her opinion of the videos succinctly, despite consistent visual cues. HEP: Choose one or two familiar conversation partners with who to practice conversation strategies and report back.   10-22-22: Pt reported that she filled out a memory mistake log. X2 memory mistakes ID, both with difficulty recalling name of a movie. SLP educated on reason why these kinds of information are challenging to recall. This appears typical. Targeted cohesiveness of verbal express at pt's request.  SLP provided on strategies on how to regulate conversation and decrease verbosity, such as turn-taking and topic maintenance. Facilitated discussion brining awareness to verbosity and communication partner's body language.  SLP provided assistance to pt to alter alarm on her phone because she reported that she continues to forget to take her reflux medication. Encouraged pt to keep her phone nearby as frequently as possible and develop a habit to check to see if she has missed reminders. Pt endorsed that the medication reminders have been helpful overall.   10-13-22: Pt requests assistance for establishing reminders or routine to aid in medication schedule adherence. She forgot to take her acid reflex med today. SLP provided A to pt to create reminders  on her phone and change settings to allow alarms. Education provided on supportive memory strategies of creating routine and not delaying when alert goes off. Provided min-A, pt added 5 medication reminders. SLP assisted in changing settings to enable sound notification.  To support establishment of robust HEP for expressive language skills, clinician introduced Pharmacist, hospital ([facility]) and educated pt on benefits, principles, and implementation. With mod-I, pt generated six complete sentences for one verb and answered why, when, and where the action would occur. Provided handout of aphasia HEP and handouts to support use of [facility] in HEP routine. Provides suggestions to modify to increase complexity as pt demonstrated completion with SLP with relative ease and was noted to provide numerous creative responses. Pt to report back on effectiveness of medication reminders.  Pt reported "sometimes I talk too much" and that she would like to work on improving this. Will target next session and practice strategies to reduce verbosity.   10-08-22: SLP re-educated pt on anomia compensations and strategies. Collaborated with pt to establish POC and goals.     PATIENT EDUCATION: Education details: see above Person educated: Patient Education method: Explanation Education comprehension: verbalized understanding and needs further education   GOALS: Goals reviewed with patient? Yes  SHORT TERM GOALS: Target date: 11-05-22  Pt will develop external aid to support orientation and schedule management.  Baseline: Goal status: MET  2.  Pt will teach back memory strategies and provide 1 functional scenario in which each could be used. Baseline:  Goal status: MET  3.  Pt will demonstrate completion of 4 HEP activities for word-finding.  Baseline:  Goal status: IN PROGRESS  LONG TERM GOALS: Target date: 12-03-22  Pt will report implementation of scheduling management tool with perceived benefit.  Baseline:  Goal status: IN PROGRESS  2.  Pt will demonstrate use of self-selected memory strategy resulting in accurate recall of information in roleplay scenario with 80% accuracy.  Baseline:  Goal status: IN PROGRESS  3.  Pt will demonstrate completion of 4 additional HEP activities for word-finding.  Baseline:  Goal status: IN PROGRESS  4.  Pt will report HEP compliance per self-directed schedule over one week period.  Baseline:  Goal status: IN PROGRESS  ASSESSMENT:  CLINICAL IMPRESSION: Patient is a 72 y.o. female who was seen today for cognitive-linguistic treatment. Pt continues to endorse occasional anomia during conversation. Requests to work on topic maintenance and turn-taking skills during conversation. SLP leads pt through structured conversational task and reviews attention strategies to support pt success. Skilled ST is indicated to target cognition and verbal expression to decrease frustration.   OBJECTIVE IMPAIRMENTS: include attention, memory, and executive functioning. These impairments are limiting patient from managing appointments, managing finances, effectively communicating at home and in community, and participating  in socialization . Factors affecting potential to achieve goals and functional outcome are  time since onset . Patient will benefit from skilled SLP services to address above impairments and improve overall function.  REHAB POTENTIAL: Fair Reduced task initiation   PLAN:  SLP FREQUENCY: 1-2x/week  SLP DURATION: 6 weeks  PLANNED INTERVENTIONS: Language facilitation, Cognitive reorganization, Internal/external aids, Functional tasks, SLP instruction and feedback, Compensatory strategies, and Patient/family education    Leroy Libman, Student-SLP 11/10/2022, 8:12 AM

## 2022-11-17 ENCOUNTER — Ambulatory Visit: Payer: 59 | Attending: Internal Medicine | Admitting: Speech Pathology

## 2022-11-17 ENCOUNTER — Other Ambulatory Visit: Payer: Self-pay | Admitting: Internal Medicine

## 2022-11-17 DIAGNOSIS — R41841 Cognitive communication deficit: Secondary | ICD-10-CM

## 2022-11-17 DIAGNOSIS — N1831 Chronic kidney disease, stage 3a: Secondary | ICD-10-CM | POA: Diagnosis not present

## 2022-11-17 NOTE — Therapy (Signed)
OUTPATIENT SPEECH LANGUAGE PATHOLOGY TREATMENT   Patient Name: Sonia Skinner MRN: [id number] DOB:October 07, 1950, 72 y.o., female Today's Date: 11/17/2022  PCP: Sonia Lima, MD REFERRING PROVIDER: [name], MD  END OF SESSION:  End of Session - 11/17/22 1408     Visit Number 5    Number of Visits 9    Date for SLP Re-Evaluation 12/03/22    Authorization Type [employer] Medicare    SLP Start Time 1404    SLP Stop Time  [id number]    SLP Time Calculation (min) 41 min    Activity Tolerance Patient tolerated treatment well             Past Medical History:  Diagnosis Date   Ankle fracture    Left   Anxiety    Carotid artery occlusion    Closed fracture of left distal fibula [id number]   Complication of anesthesia    ALLERY TO ESTER BASE   Depression    early 41s   Diabetes mellitus    INSULIN DEPENDENT   GERD (gastroesophageal reflux disease)    Headache    years ago   Hypertension    Pneumonia    Stroke [facility]) March 2015    left MCA infarct, slight weakness on left side   Thyroid disease    Past Surgical History:  Procedure Laterality Date   ABDOMINAL HYSTERECTOMY     ANTERIOR FIXATION AND POSTERIOR MICRODISCECTOMY CERVICAL SPINE  1999   CAROTID ENDARTERECTOMY Left 11-04-13   cea   ENDARTERECTOMY Left 11/04/2013   IR ANGIO INTRA EXTRACRAN SEL COM CAROTID INNOMINATE BILAT MOD SED  11/16/2021   IR ANGIO INTRA EXTRACRAN SEL COM CAROTID INNOMINATE UNI L MOD SED  11/19/2021   IR ANGIO VERTEBRAL SEL VERTEBRAL BILAT MOD SED  11/16/2021   IR CT HEAD LTD  11/19/2021   IR INTRAVSC STENT CERV CAROTID W/EMB-PROT MOD SED INCL ANGIO  11/19/2021   IR RADIOLOGIST EVAL & MGMT  12/13/2021   ORIF ANKLE FRACTURE Left 09/16/2017   Procedure: OPEN REDUCTION INTERNAL FIXATION (ORIF) LEFT ANKLE FRACTURE;  Surgeon: Sonia Bond, MD;  Location: [place];  Service: Orthopedics;  Laterality: Left;   RADIOLOGY WITH ANESTHESIA N/A 11/19/2021   Procedure: Left carotid angioplasty with possible stenting;  Surgeon:  Sonia Bras, MD;  Location: [place];  Service: Radiology;  Laterality: N/A;   Patient Active Problem List   Diagnosis Date Noted   Chronic idiopathic constipation 08/31/2022   Chronic renal disease, stage 4, severely decreased glomerular filtration rate (GFR) between 15-29 mL/min/1.73 square meter 06/08/2022   Chronic midline low back pain with bilateral sciatica 06/01/2022   Multinodular goiter 11/02/2021   Encounter for general adult medical examination with abnormal findings 04/08/2021   Visit for screening mammogram 04/01/2020   Type 2 diabetes mellitus with complication, with long-term current use of insulin 09/18/2018   Age-related osteoporosis with current pathological fracture 02/20/2018   Intractable migraine without aura and with status migrainosus 11/24/2016   GERD (gastroesophageal reflux disease) 03/08/2016   Major depressive disorder, recurrent episode 02/20/2016   Occlusion and stenosis of carotid artery with cerebral infarction 11/25/2013   Cerebrovascular accident (CVA) due to stenosis of left middle cerebral artery 11/02/2013   Vitamin D deficiency 10/19/2013   Tobacco abuse 07/31/2013   Essential hypertension, benign 04/20/2013   Type II diabetes mellitus with manifestations 04/20/2013   Hypothyroidism 04/20/2013   Hyperlipidemia with target LDL less than 70 04/20/2013    ONSET DATE: Referral 09-11-22  REFERRING DIAG: [id number] (ICD-10-CM) - Cerebrovascular accident (CVA) due to stenosis of left middle cerebral artery (HCC)   THERAPY DIAG:  Cognitive communication deficit  Rationale for Evaluation and Treatment: Rehabilitation  SUBJECTIVE:   SUBJECTIVE STATEMENT: "I'm here" Pt accompanied by: self  PERTINENT HISTORY: [name] is a 72 year old female with past medical history significant for type 2 diabetes mellitus, essential hypertension, ICA stenosis s/p left carotid endarterectomy 2015, CKD stage IIIb, GERD, history of CVA. Presented to ED on  11/13/21 with a visual changes described as "vision white out", feeling of tongue thickening, gait disturbance and speech impairment. Speech impairment reported lasting about 30 minutes.  Additionally with fatigue and dizziness which prompted her to seek care in the ED. In the ED, CT head without contrast with no acute intracranial findings, noted old infarct left parietal cortex unchanged. Neurology was consulted.  Patient was transferred to [facility] for further stroke versus TIA work-up. On admission, MR brain no acute infarct, chronic left MCA infarct.  MRA head/neck 70% stenosis proximal cervical left ICA. She underwent  EEG with no seizures or epileptiform discharges. She underwent cerebral angiograph with left ICA stent placement on 11/19/21 which was well tolerated.     PAIN:  Are you having pain? No pain reported  FALLS: Has patient fallen in last 6 months?  See PT evaluation for details  LIVING ENVIRONMENT: Lives with: lives with their family Lives in: House/apartment  PLOF:  Level of assistance: Independent with ADLs, Independent with IADLs Employment: Retired  PATIENT GOALS: To establish a daily schedule, work on recall strategies  OBJECTIVE:   TODAY'S TREATMENT:                                                                                                                                         DATE:   11-17-22: In structured language activity targeting anomia, pt generated 2-3 synonyms for 40 words given mod-I. Provided pt resources on aphasia HEP and continued to educate pt on anomia strategies and compensations to implement during conversation. Discussed benefits of reading and pt signed up to receive synonym of the day from [email or web address]. Pt agreeable to d/c and aware that if circumstances change, she can reach out to her PCP for another ST referral.    11-03-22: Pt elected to work on turn-taking and topic maintenance during conversation. SLP educated pt on attention  strategies to implement during conversation to reduce verbosity and increase conciseness of utterances. During structured conversational task and given visual aid, pt able to summarize TED Talk videos within 2-3 sentences. Demonstrated most difficulty with expressing her opinion of the videos succinctly, despite consistent visual cues. HEP: Choose one or two familiar conversation partners with who to practice conversation strategies and report back.   10-22-22: Pt reported that she filled out a memory mistake log. X2 memory mistakes ID, both with difficulty recalling name of a movie.  SLP educated on reason why these kinds of information are challenging to recall. This appears typical. Targeted cohesiveness of verbal express at pt's request. SLP provided on strategies on how to regulate conversation and decrease verbosity, such as turn-taking and topic maintenance. Facilitated discussion brining awareness to verbosity and communication partner's body language.  SLP provided assistance to pt to alter alarm on her phone because she reported that she continues to forget to take her reflux medication. Encouraged pt to keep her phone nearby as frequently as possible and develop a habit to check to see if she has missed reminders. Pt endorsed that the medication reminders have been helpful overall.   10-13-22: Pt requests assistance for establishing reminders or routine to aid in medication schedule adherence. She forgot to take her acid reflex med today. SLP provided A to pt to create reminders on her phone and change settings to allow alarms. Education provided on supportive memory strategies of creating routine and not delaying when alert goes off. Provided min-A, pt added 5 medication reminders. SLP assisted in changing settings to enable sound notification.  To support establishment of robust HEP for expressive language skills, clinician introduced Pharmacist, hospital ([facility]) and educated pt on  benefits, principles, and implementation. With mod-I, pt generated six complete sentences for one verb and answered why, when, and where the action would occur. Provided handout of aphasia HEP and handouts to support use of [facility] in HEP routine. Provides suggestions to modify to increase complexity as pt demonstrated completion with SLP with relative ease and was noted to provide numerous creative responses. Pt to report back on effectiveness of medication reminders.  Pt reported "sometimes I talk too much" and that she would like to work on improving this. Will target next session and practice strategies to reduce verbosity.   10-08-22: SLP re-educated pt on anomia compensations and strategies. Collaborated with pt to establish POC and goals.    PATIENT EDUCATION: Education details: see above Person educated: Patient Education method: Explanation Education comprehension: verbalized understanding and needs further education  SPEECH THERAPY DISCHARGE SUMMARY  Visits from Start of Care: 5  Current functional level related to goals / functional outcomes: Pt able to independently implement cognitive-communication strategies in  daily life    Remaining deficits: Mild anomia    Education / Equipment: Handouts, anomia strategies and compensations, memory and attention strategies and compensations   Patient agrees to discharge. Patient goals were met. Patient is being discharged due to meeting the stated rehab goals.Marland Kitchen    GOALS: Goals reviewed with patient? Yes  SHORT TERM GOALS: Target date: 11-05-22  Pt will develop external aid to support orientation and schedule management.  Baseline: Goal status: MET  2.  Pt will teach back memory strategies and provide 1 functional scenario in which each could be used. Baseline:  Goal status: MET  3.  Pt will demonstrate completion of 4 HEP activities for word-finding.  Baseline:  Goal status: MET  LONG TERM GOALS: Target date: 12-03-22  Pt  will report implementation of scheduling management tool with perceived benefit.  Baseline:  Goal status: MET  2.  Pt will demonstrate use of self-selected memory strategy resulting in accurate recall of information in roleplay scenario with 80% accuracy.  Baseline:  Goal status: MET  3.  Pt will demonstrate completion of 4 additional HEP activities for word-finding.  Baseline:  Goal status: MET  4.  Pt will report HEP compliance per self-directed schedule over one week period.  Baseline:  Goal status: MET  ASSESSMENT:  CLINICAL IMPRESSION: Patient is a 72 y.o. female who was seen today for cognitive-linguistic treatment. Pt continues to endorse occasional anomia during conversation. Requests to work on topic maintenance and turn-taking skills during conversation. Skilled interventions targeted anomia compensations this date. Pt agreeable to d/c this date. Verbalizes appreciation for therapeutic interventions.   OBJECTIVE IMPAIRMENTS: include attention, memory, and executive functioning. These impairments are limiting patient from managing appointments, managing finances, effectively communicating at home and in community, and participating in socialization . Factors affecting potential to achieve goals and functional outcome are  time since onset . Patient will benefit from skilled SLP services to address above impairments and improve overall function.  REHAB POTENTIAL: Fair Reduced task initiation   PLAN:  SLP FREQUENCY: 1-2x/week  SLP DURATION: 6 weeks  PLANNED INTERVENTIONS: Language facilitation, Cognitive reorganization, Internal/external aids, Functional tasks, SLP instruction and feedback, Compensatory strategies, and Patient/family education    Leroy Libman, [place] 11/17/2022, 2:43 PM

## 2022-11-17 NOTE — Patient Instructions (Signed)
Improving your language skills: Daily practice that focuses on challenging your language system Schedule a practice session into your daily routine Determine how long you will practice and when Choose from an activity that we've taught you  I will practice for _____ (how long) minutes a day at __________ (when).  I will remember to practice by ___________________ (setting reminder, adding to to do list, scheduling on calendar, having family member remind you)   Activities:  Verb Network Strength Training Use the outline we provided to guide your practice Pick 3-5 verbs  Get creative and think of 3 different who [verb] what for each verb Answer the when, why, where questions if you can for each sentence you made   For example: verb= EAT   I eat cake    Why? To celebrate    When? On my birthday    Where? At the party   My dog (name) eat [employer] dog food    Why? To live, because he's hungry    When? 2x a day, morning and night    Where? In the kitchen  2.    Semantic Feature Analysis Pick 5 objects (can be photos, things in your environment, or just thought up) Use the visual aid you've been given to guide your practice Try to completely describe the selected object using the prompts on your chart  *REMEMBER* Describing is a great tool for when you can't think of a word in conversation. This practice can help build your word inventory but can also help you be able to describe when you are having a conversation and cannot think of the word you're looking for!!  3.   Picture Description Find pictures you can use. Pick 5 for your practice on this day.   Some ideas are:  personal albums Cell phone photos Magazines Yearbook Newspaper Online  Social media  Describe as completely as you can.   Who, what, where, when, why, how  What does it make you think of?  What memories do you have of this event?  Do you have any opinions about what you see?   This is an activity that may  be fun to do with someone else. Share your ideas!  4.   Object Naming Go around the rooms in your home and name each object you see.  Go outside and name what you see. Open drawers, cabinets, closets and name the items you see. Look around and name items that begin with a certain letter. Look around and name items that are a certain color.  Find a photo and name as many objects as you can find  5.   Positive Attributes List as many people as you can from a category: Family Friends church members Singers movie stars TV actors Characters on a show Politicians Scholars co-workers, Then generate one positive attribute to describe each person on your list. Be creative and make sure each positive quality is unique!  6.   New [place] Times: What's Going on in this Picture https://www.nytimes.com/column/learning-whats-going-on-in-this-picture  Use the photo to create a short story that could go with the photo.  Check back later to see what the real story is. Did you get anything right?   6.   Journaling Writing can be a wonderful way to expand your language skills Writing provides to opportunity to be careful with your word choice and think of creative ways to say what you're thinking  Here are some ways to journal: Pick a general prompt   that you can answer every day What I did today What is on my mind Google daily journal prompt to find something creative Buy a journal (amazon, Barnes and Noble, etc) with pre-prescribed prompts  Buy a memory book with prompts to organize your life story 'This Life of Mine: A Keepsake Journal for Grandparents, Parents and Anyone to Preserve Memories, Moments & Milestones' Grandparent-Grandchild Writing Kit [employer]  My life in a book Start a scrapbook Pick meaningful photos Put 1-3 photos on a page (if more than one, they should be related) Jot down your memories sparked by the image(s)   7.   Verb A Day Choose a verb for that day Come up  with 10 phrases that use the verb Keep the verb in your mind all day, revisiting it whenever you get a chance Try to use this verb as much as possible when talking to other people or describing your actions as you move throughout your day After dinner, review your verb and see if it feels more familiar since the morning  8.   Play Pictionary (can buy the game or just play informally)  Player One draws something on a piece of paper, and Player Two has to guess what it is.  For example, if the word you're thinking of is "birthday," you may draw a cake with candles, party balloons, or a calendar.  Keep switching off so you get a chance to draw and you get a chance guess the answers.   9.   Plan a trip Choose a place that you've always wanted to visit Look up pictures on the Internet, or use Tripadvisor to create a list of sites you'd like to see If you have time, write the alphabet down the left side of a piece of paper, finding one element of this place for each letter For example, if the place you want to go to is [place], you may have the [place] for "E" or subway ride for "S."   10.  Poetry Reading Poetry is musical in nature, so it is sometimes easier to read a short poem than it is to read a short article. A great poet for often funny and sometimes brief works is Shel Silverstein. He uses rhymes and repetition to create a rhythm in his poems. His poetry is also suitable for kids, so his work is the perfect poetry to practice in front of grandchildren. Set up a Zoom call with a friend or family member and read them a poem today.   Think about your opinions on the poem. What did it make you think or feel? Do you have an opinion on the poem?   11.  Summarize watch/listen to podcast, show, movie, sermon, TED talk, audiobook, etc write or speak a brief summary  12.  Speaking Crosswords Completing crossword puzzles is a great way to practice speech and word finding because  they're often a little challenging. Filling out a crossword puzzle means playing with words. You can take your crossword puzzle to the next level by speaking your crossword puzzle. In other words, read aloud each clue before you try to solve it. The New York Times makes mini-puzzles that they post online and in books. Try to fill one out every day while speaking the clues aloud.  Mini puzzles from NYT can be found on their website, on the NYT Games app, or there are books of them (NYT store or Amazon)   13.  Get social post   on social media interact with another's social media post send a few friends an email or text message  

## 2022-11-26 ENCOUNTER — Telehealth (HOSPITAL_BASED_OUTPATIENT_CLINIC_OR_DEPARTMENT_OTHER): Payer: 59 | Admitting: Psychiatry

## 2022-11-26 ENCOUNTER — Encounter (HOSPITAL_COMMUNITY): Payer: Self-pay | Admitting: Psychiatry

## 2022-11-26 VITALS — Wt 116.0 lb

## 2022-11-26 DIAGNOSIS — R413 Other amnesia: Secondary | ICD-10-CM | POA: Diagnosis not present

## 2022-11-26 DIAGNOSIS — F331 Major depressive disorder, recurrent, moderate: Secondary | ICD-10-CM | POA: Diagnosis not present

## 2022-11-26 DIAGNOSIS — F411 Generalized anxiety disorder: Secondary | ICD-10-CM

## 2022-11-26 MED ORDER — BUPROPION HCL ER (XL) 300 MG PO TB24: 300.0000 mg | ORAL_TABLET | Freq: Every day | ORAL | 1 refills | Status: DC

## 2022-11-26 MED ORDER — BREXPIPRAZOLE 2 MG PO TABS: 2.0000 mg | ORAL_TABLET | Freq: Every day | ORAL | 1 refills | Status: DC

## 2022-11-26 MED ORDER — TRAZODONE HCL 50 MG PO TABS: 50.0000 mg | ORAL_TABLET | Freq: Every evening | ORAL | 1 refills | Status: DC | PRN

## 2022-11-26 NOTE — Progress Notes (Signed)
[facility] MD Virtual Progress Note   Patient Location: Home Provider Location: Home Office  I connect with patient by telephone and verified that I am speaking with correct person by using two identifiers. I discussed the limitations of evaluation and management by telemedicine and the availability of in person appointments. I also discussed with the patient that there may be a patient responsible charge related to this service. The patient expressed understanding and agreed to proceed.  Sonia Skinner [id number] 72 y.o.  11/26/2022 10:23 AM  History of Present Illness:  Patient is evaluated by phone session.  She is not comfortable on the camera.  On the last visit we started her on REXULTI as previously she did realize did not give enough time.  Now she is taking 1 mg and she noticed no major concern but also do not see a huge improvement.  She continues to struggle with chronic depression, fatigue, neuropathy.  Most of her symptoms are somatic.  She still have difficulty staying asleep.  Her memory has been an issue and sometimes she has difficulty word finding.  However denies any crying spells or any suicidal thoughts.  Patient does take care of her husband who has a stroke.  Patient reported she had given some break to Ozempic but is still have occasional abdominal pain and nausea.  She has appointment coming up with her primary care in May.  Overall she feel very REXULTI helping but like to try something to help her sleep.  Since stopped the Ozempic she is not losing the weight.  She still have a lot of ruminative thoughts, but no hallucination, paranoia or any suicidal thoughts.  She has no tremors or shakes.  Past Psychiatric History: H/O taking antidepressant on and off most of life. H/O overdose and inpatient at [facility]. Saw provider at [facility] and given the Strattera, Adderall, Zoloft, Celexa, Lamictal and Lexapro.  Never tested for ADHD. No h/o psychosis,  mania or hallucination.  In 2001 moved to [facility] and given amitriptyline and Wellbutrin to stop smoking. Tried Prozac and Paxil but mage tired and nausea.     Outpatient Encounter Medications as of 11/26/2022  Medication Sig   Accu-Chek FastClix Lancets MISC Use to check blood sugar five times daily.   ACCU-CHEK GUIDE test strip    acetaminophen (TYLENOL) 500 MG tablet Take 1,000-1,500 mg by mouth every 6 (six) hours as needed for mild pain.   amLODipine (NORVASC) 10 MG tablet Take 1 tablet (10 mg total) by mouth daily.   aspirin 81 MG chewable tablet Chew 1 tablet (81 mg total) by mouth daily.   atorvastatin (LIPITOR) 40 MG tablet Take 1 tablet by mouth daily.   brexpiprazole (REXULTI) 1 MG TABS tablet Take 1 tablet (1 mg total) by mouth daily.   buPROPion (WELLBUTRIN XL) 300 MG 24 hr tablet Take 1 tablet (300 mg total) by mouth daily.   Cholecalciferol (VITAMIN D3 PO) Take 1 tablet by mouth daily. Unsure of dose   Continuous Blood Gluc Receiver (DEXCOM G7 RECEIVER) DEVI 1 Act by Does not apply route daily.   Continuous Blood Gluc Sensor (DEXCOM G7 SENSOR) MISC 1 Act by Does not apply route daily.   Continuous Blood Gluc Sensor (DEXCOM G7 SENSOR) MISC 1 Act by Does not apply route daily.   Continuous Blood Gluc Sensor (DEXCOM G7 SENSOR) MISC USE AS DIRECTED TOPICALLY  EVERY  10  DAYS   dapagliflozin propanediol (FARXIGA) 10 MG TABS tablet Take 1  tablet (10 mg total) by mouth daily before breakfast.   dexlansoprazole (DEXILANT) 60 MG capsule Take 1 capsule by mouth daily.   EDARBYCLOR 40-12.5 MG TABS Take 1 tablet by mouth daily.   ezetimibe (ZETIA) 10 MG tablet Take 1 tablet by mouth once daily.   gabapentin (NEURONTIN) 100 MG capsule Take 1 capsule (100 mg total) by mouth 3 (three) times daily.   HUMALOG KWIKPEN 200 UNIT/ML KwikPen Inject 5 units under the skin three times daily with meals.   HYDROcodone-acetaminophen (NORCO) 5-325 MG tablet Take 1 tablet by mouth every 6 (six)  hours as needed for moderate pain.   insulin glargine, 1 Unit Dial, (TOUJEO SOLOSTAR) 300 UNIT/ML Solostar Pen Inject 20-22 Units into the skin daily. (Patient taking differently: Inject 20-22 Units into the skin daily. Per sliding scale)   levothyroxine (SYNTHROID) 88 MCG tablet Take 1 tablet (88 mcg total) by mouth daily.   loperamide (IMODIUM) 2 MG capsule Take 1 capsule (2 mg total) by mouth 4 (four) times daily as needed for diarrhea or loose stools.   loratadine (CLARITIN) 10 MG tablet Take 1 tablet (10 mg total) by mouth daily.   lubiprostone (AMITIZA) 24 MCG capsule Take 1 capsule (24 mcg total) by mouth 2 (two) times daily with a meal.   PARoxetine (PAXIL) 20 MG tablet Take 1 tablet (20 mg total) by mouth daily. (Patient not taking: Reported on 09/27/2022)   Semaglutide, 2 MG/DOSE, 8 MG/3ML SOPN Inject 2 mg as directed once a week.   No facility-administered encounter medications on file as of 11/26/2022.    Recent Results (from the past 2160 hour(s))  Lab report - scanned     Status: None   Collection Time: 11/02/22  3:51 PM  Result Value Ref Range   Microalb Creat Ratio 3     Comment: Abstracted by HIM     Psychiatric Specialty Exam: Physical Exam  Review of Systems  Weight 116 lb (52.6 kg).There is no height or weight on file to calculate BMI.  General Appearance: NA  Eye Contact:  NA  Speech:  Slow  Volume:  Decreased  Mood:  Dysphoric  Affect:  NA  Thought Process:  Descriptions of Associations: Intact  Orientation:  Full (Time, Place, and Person)  Thought Content:  Rumination  Suicidal Thoughts:  No  Homicidal Thoughts:  No  Memory:  Immediate;   Fair Recent;   Fair Remote;   Fair  Judgement:  Intact  Insight:  Shallow  Psychomotor Activity:  Decreased  Concentration:  Concentration: Fair and Attention Span: Fair  Recall:  [employer] of Knowledge:  Good  Language:  Good  Akathisia:  No  Handed:  Right  AIMS (if indicated):     Assets:  Communication  Skills Desire for Improvement Housing Transportation  ADL's:  Intact  Cognition:  WNL  Sleep:  fair     Assessment/Plan: Moderate episode of recurrent major depressive disorder - Plan: brexpiprazole (REXULTI) 2 MG TABS tablet, buPROPion (WELLBUTRIN XL) 300 MG 24 hr tablet, traZODone (DESYREL) 50 MG tablet  GAD (generalized anxiety disorder) - Plan: brexpiprazole (REXULTI) 2 MG TABS tablet, buPROPion (WELLBUTRIN XL) 300 MG 24 hr tablet  Memory changes - Plan: buPROPion (WELLBUTRIN XL) 300 MG 24 hr tablet  So far patient tolerating REXULTI 1 mg and has not have significant concerns or side effects.  She actually like to increase the dose.  She also like something to help her sleep is still having ruminative thoughts, racing thoughts and  not staying asleep all night.  Recommend she can try low-dose trazodone to help her sleep.  We will also increase REXULTI 2 mg and keep the Wellbutrin XL 300 mg daily.  I again emphasized the need to talk to her primary care for multiple somatic complaints including chronic fatigue, abdominal pain, nausea, memory issues.  We have recommended to see neurology but unsuccessful.  However patient agree to discuss these issues with primary care visit which is coming up in May.  I recommend to call us back if he has any question or any concern.  We will follow-up in 2 months.  She can call us back if she need an earlier appointment.   Follow Up Instructions:     I discussed the assessment and treatment plan with the patient. The patient was provided an opportunity to ask questions and all were answered. The patient agreed with the plan and demonstrated an understanding of the instructions.   The patient was advised to call back or seek an in-person evaluation if the symptoms worsen or if the condition fails to improve as anticipated.    Collaboration of Care: Other provider involved in patient's care AEB notes are available in epic to review.  Patient/Guardian  was advised Release of Information must be obtained prior to any record release in order to collaborate their care with an outside provider. Patient/Guardian was advised if they have not already done so to contact the registration department to sign all necessary forms in order for [place] to release information regarding their care.   Consent: Patient/Guardian gives verbal consent for treatment and assignment of benefits for services provided during this visit. Patient/Guardian expressed understanding and agreed to proceed.     I provided 28 minutes of non face to face time during this encounter.  Note: This document was prepared by [employer] voice dictation technology and any errors that results from this process are unintentional.    Cleotis Nipper, MD 11/26/2022

## 2022-12-12 ENCOUNTER — Other Ambulatory Visit: Payer: Self-pay | Admitting: Internal Medicine

## 2022-12-12 DIAGNOSIS — I10 Essential (primary) hypertension: Secondary | ICD-10-CM

## 2022-12-12 DIAGNOSIS — E785 Hyperlipidemia, unspecified: Secondary | ICD-10-CM

## 2022-12-12 DIAGNOSIS — E119 Type 2 diabetes mellitus without complications: Secondary | ICD-10-CM

## 2022-12-12 DIAGNOSIS — E118 Type 2 diabetes mellitus with unspecified complications: Secondary | ICD-10-CM

## 2022-12-15 DIAGNOSIS — N1831 Chronic kidney disease, stage 3a: Secondary | ICD-10-CM | POA: Diagnosis not present

## 2022-12-15 DIAGNOSIS — E1122 Type 2 diabetes mellitus with diabetic chronic kidney disease: Secondary | ICD-10-CM | POA: Diagnosis not present

## 2022-12-15 DIAGNOSIS — I639 Cerebral infarction, unspecified: Secondary | ICD-10-CM | POA: Diagnosis not present

## 2022-12-15 DIAGNOSIS — I129 Hypertensive chronic kidney disease with stage 1 through stage 4 chronic kidney disease, or unspecified chronic kidney disease: Secondary | ICD-10-CM | POA: Diagnosis not present

## 2022-12-15 DIAGNOSIS — E785 Hyperlipidemia, unspecified: Secondary | ICD-10-CM | POA: Diagnosis not present

## 2022-12-15 DIAGNOSIS — N179 Acute kidney failure, unspecified: Secondary | ICD-10-CM | POA: Diagnosis not present

## 2022-12-16 ENCOUNTER — Telehealth: Payer: Self-pay | Admitting: Internal Medicine

## 2022-12-16 ENCOUNTER — Other Ambulatory Visit: Payer: Self-pay | Admitting: Internal Medicine

## 2022-12-16 DIAGNOSIS — G8929 Other chronic pain: Secondary | ICD-10-CM

## 2022-12-16 NOTE — Telephone Encounter (Signed)
Prescription Request  12/16/2022  LOV: 08/31/2022  What is the name of the medication or equipment? HYDROcodone-acetaminophen (NORCO) 5-325 MG tablet   Have you contacted your pharmacy to request a refill? No   Which pharmacy would you like this sent to?     [facility] - [place], [place] - [address] [place] [address] Phone: [phone] Fax: [phone]  Patient notified that their request is being sent to the clinical staff for review and that they should receive a response within 2 business days.   Please advise at Mobile [phone] (mobile)

## 2022-12-17 ENCOUNTER — Telehealth: Payer: Self-pay

## 2022-12-17 ENCOUNTER — Telehealth: Payer: Self-pay | Admitting: Internal Medicine

## 2022-12-17 ENCOUNTER — Other Ambulatory Visit (HOSPITAL_COMMUNITY): Payer: Self-pay

## 2022-12-17 MED ORDER — HYDROCODONE-ACETAMINOPHEN 5-325 MG PO TABS: 1.0000 | ORAL_TABLET | Freq: Four times a day (QID) | ORAL | 0 refills | Status: DC | PRN

## 2022-12-17 NOTE — Telephone Encounter (Signed)
Please see separate encounter for updates on status

## 2022-12-17 NOTE — Telephone Encounter (Signed)
HYDROcodone-acetaminophen (NORCO) 5-325 MG tablet Need Prior Authorization

## 2022-12-17 NOTE — Telephone Encounter (Signed)
Pharmacy Patient Advocate Encounter  Prior Authorization for HYDROCO/APAP TAB 5-325MG  has been approved.    PA# [id number] Effective dates: 12/16/22 through 01/16/23  Haze Rushing, CPhT Pharmacy Patient Advocate Specialist Direct Number: [phone] Fax: [phone]

## 2022-12-17 NOTE — Telephone Encounter (Signed)
Pharmacy Patient Advocate Encounter   Received notification from [facility] MEDICARE D that prior authorization for [facility] is needed.    PA submitted on 12/17/22 Key [id number] Status is pending  Haze Rushing, CPhT Pharmacy Patient Advocate Specialist Direct Number: [phone] Fax: [phone]

## 2022-12-19 NOTE — Progress Notes (Signed)
Cardiology Office Note:    Date:  12/27/2022   ID:  Sonia Skinner, DOB 03-Mar-1951, MRN [id number]  PCP:  Etta Grandchild, MD   [facility] Providers Cardiologist:  None {   Referring MD: Etta Grandchild, MD    History of Present Illness:    Sonia Skinner is a 72 y.o. female with a hx of stroke in 2015 with residual left sided weakness, left carotid artery occlusion s/p left CEA, DM, HTN, prior cervical spine anterior fixation with posterior microdiscectomy and thyroid disease who was referred to clinic by Dr. Yetta Barre for follow-up for recent hospitalization of TIA.  The patient was hospitalized at [facility] from 08/13/21-08/15/21 after she presented with left sided numbness and weakness. CT head  showed no acute abnormality and the MRI showed her previous stroke.  Carotid Dopplers revealed: Right Carotid: Velocities in the right ICA are consistent with a 1-39% stenosis. Left Carotid: Velocities in the left ICA are consistent with a 60-79%  , 2D echo showed EF of 70 to 75% with left ventricle hyperdynamic function. Patient was managed conservatively and treated with ASA 81mg  and plavix 75mg  for planned 21 days followed by ASA 325mg  daily.  Ca score 03/2022 196. Myoview for chest tightness on 04/2022 normal with no ischemia or infarct.  Was last seen in clinic on 06/2022 where she was doing well from a CV standpoint.  Today, the patient overall feels well. She is follows with [facility] and her amlodipine has been adjusted. Otherwise, no chest pain, SOB, orthopnea, LE edema, or PND. Tolerating medications as prescribed. Blood pressure is very well controlled at home. Has been doing chair yoga class at [place].   Past Medical History:  Diagnosis Date   Ankle fracture    Left   Anxiety    Carotid artery occlusion    Closed fracture of left distal fibula 09/16/2017   Complication of anesthesia    ALLERY TO ESTER BASE   Depression    early 30s   Diabetes mellitus    INSULIN DEPENDENT   GERD  (gastroesophageal reflux disease)    Headache    years ago   Hypertension    Pneumonia    Stroke [facility]) March 2015    left MCA infarct, slight weakness on left side   Thyroid disease     Past Surgical History:  Procedure Laterality Date   ABDOMINAL HYSTERECTOMY     ANTERIOR FIXATION AND POSTERIOR MICRODISCECTOMY CERVICAL SPINE  1999   CAROTID ENDARTERECTOMY Left 11-04-13   cea   ENDARTERECTOMY Left 11/04/2013   IR ANGIO INTRA EXTRACRAN SEL COM CAROTID INNOMINATE BILAT MOD SED  11/16/2021   IR ANGIO INTRA EXTRACRAN SEL COM CAROTID INNOMINATE UNI L MOD SED  11/19/2021   IR ANGIO VERTEBRAL SEL VERTEBRAL BILAT MOD SED  11/16/2021   IR CT HEAD LTD  11/19/2021   IR INTRAVSC STENT CERV CAROTID W/EMB-PROT MOD SED INCL ANGIO  11/19/2021   IR RADIOLOGIST EVAL & MGMT  12/13/2021   ORIF ANKLE FRACTURE Left 09/16/2017   Procedure: OPEN REDUCTION INTERNAL FIXATION (ORIF) LEFT ANKLE FRACTURE;  Surgeon: Teryl Lucy, MD;  Location: [facility] OR;  Service: Orthopedics;  Laterality: Left;   RADIOLOGY WITH ANESTHESIA N/A 11/19/2021   Procedure: Left carotid angioplasty with possible stenting;  Surgeon: Julieanne Cotton, MD;  Location: [facility] OR;  Service: Radiology;  Laterality: N/A;    Current Medications: Current Meds  Medication Sig   Accu-Chek FastClix Lancets MISC Use to check blood sugar five  times daily.   ACCU-CHEK GUIDE test strip Use to check blood sugar twice a day. E11.9   amLODipine (NORVASC) 10 MG tablet Take 1 tablet (10 mg total) by mouth daily.   aspirin 81 MG chewable tablet Chew 1 tablet (81 mg total) by mouth daily.   atorvastatin (LIPITOR) 40 MG tablet Take 1 tablet by mouth daily.   brexpiprazole (REXULTI) 2 MG TABS tablet Take 1 tablet (2 mg total) by mouth daily.   buPROPion (WELLBUTRIN XL) 300 MG 24 hr tablet Take 1 tablet (300 mg total) by mouth daily.   Cholecalciferol (VITAMIN D3 PO) Take 1 tablet by mouth daily. Unsure of dose   Continuous Blood Gluc Receiver (DEXCOM G7 RECEIVER) DEVI 1 Act  by Does not apply route daily.   Continuous Blood Gluc Sensor (DEXCOM G7 SENSOR) MISC 1 Act by Does not apply route daily.   Continuous Blood Gluc Sensor (DEXCOM G7 SENSOR) MISC 1 Act by Does not apply route daily.   Continuous Blood Gluc Sensor (DEXCOM G7 SENSOR) MISC USE AS DIRECTED TOPICALLY  EVERY  10  DAYS   dapagliflozin propanediol (FARXIGA) 10 MG TABS tablet Take 1 tablet (10 mg total) by mouth daily before breakfast.   dexlansoprazole (DEXILANT) 60 MG capsule Take 1 capsule by mouth daily.   EDARBYCLOR 40-12.5 MG TABS Take 1 tablet by mouth daily.   ezetimibe (ZETIA) 10 MG tablet Take 1 tablet by mouth once daily.   gabapentin (NEURONTIN) 100 MG capsule Take 1 capsule (100 mg total) by mouth 3 (three) times daily.   HUMALOG KWIKPEN 200 UNIT/ML KwikPen Inject 5 units under the skin three times daily with meals.   HYDROcodone-acetaminophen (NORCO) 5-325 MG tablet Take 1 tablet by mouth every 6 (six) hours as needed for moderate pain.   insulin glargine, 1 Unit Dial, (TOUJEO SOLOSTAR) 300 UNIT/ML Solostar Pen Inject 20-22 Units into the skin daily. (Patient taking differently: Inject 20-22 Units into the skin daily. Per sliding scale)   levothyroxine (SYNTHROID) 88 MCG tablet Take 1 tablet (88 mcg total) by mouth daily.   loperamide (IMODIUM) 2 MG capsule Take 1 capsule (2 mg total) by mouth 4 (four) times daily as needed for diarrhea or loose stools.   loratadine (CLARITIN) 10 MG tablet Take 1 tablet (10 mg total) by mouth daily.   lubiprostone (AMITIZA) 24 MCG capsule Take 1 capsule (24 mcg total) by mouth 2 (two) times daily with a meal.   PARoxetine (PAXIL) 20 MG tablet Take 1 tablet (20 mg total) by mouth daily.   Semaglutide, 2 MG/DOSE, 8 MG/3ML SOPN Inject 2 mg as directed once a week.   traZODone (DESYREL) 50 MG tablet Take 1 tablet (50 mg total) by mouth at bedtime as needed for sleep.     Allergies:   Anesthetics, ester   Social History   Socioeconomic History   Marital  status: Legally Separated    Spouse name: Not on file   Number of children: 0   Years of education: College   Highest education level: Not on file  Occupational History   Occupation: [employer]    Employer: [employer]  Tobacco Use   Smoking status: Former    Packs/day: 1.00    Years: 20.00    Additional pack years: 0.00    Total pack years: 20.00    Types: Cigarettes    Quit date: 11/01/2013    Years since quitting: 9.1   Smokeless tobacco: Never  Vaping Use   Vaping Use: Never used  Substance and Sexual Activity   Alcohol use: Not Currently    Alcohol/week: 1.0 - 2.0 standard drink of alcohol    Types: 1 - 2 Standard drinks or equivalent per week   Drug use: No   Sexual activity: Not Currently  Other Topics Concern   Not on file  Social History Narrative   Patient lives in 1 story home with sister.   Caffeine Use: 2 cups daily; sodas occasionally   Some college education   Works as Lawyer for [employer]   Social Determinants of Health   Financial Resource Strain: Low Risk  (01/28/2022)   Overall Financial Resource Strain (CARDIA)    Difficulty of Paying Living Expenses: Not hard at all  Food Insecurity: No Food Insecurity (08/19/2022)   Hunger Vital Sign    Worried About Running Out of Food in the Last Year: Never true    Ran Out of Food in the Last Year: Never true  Transportation Needs: No Transportation Needs (08/19/2022)   PRAPARE - Administrator, Civil Service (Medical): No    Lack of Transportation (Non-Medical): No  Physical Activity: Insufficiently Active (01/28/2022)   Exercise Vital Sign    Days of Exercise per Week: 1 day    Minutes of Exercise per Session: 30 min  Stress: No Stress Concern Present (01/28/2022)   [employer] of Occupational Health - Occupational Stress Questionnaire    Feeling of Stress : Not at all  Social Connections: Moderately Integrated (01/28/2022)   Social Connection and Isolation Panel [NHANES]    Frequency of  Communication with Friends and Family: More than three times a week    Frequency of Social Gatherings with Friends and Family: More than three times a week    Attends Religious Services: More than 4 times per year    Active Member of [employer] or Organizations: Yes    Attends Engineer, structural: More than 4 times per year    Marital Status: Separated     Family History: The patient's family history includes Cancer in her father; Diabetes in her mother, sister, sister, and sister; Heart disease in her mother; Hypertension in her sister.  ROS:   Please see the history of present illness.    Review of Systems  Constitutional:  Negative for malaise/fatigue.  Respiratory:  Negative for shortness of breath.   Cardiovascular:  Negative for chest pain, palpitations, orthopnea, claudication, leg swelling and PND.  Gastrointestinal:  Negative for blood in stool, melena, nausea and vomiting.  Genitourinary:  Negative for hematuria.  Musculoskeletal:  Negative for falls.  Neurological:  Negative for dizziness and loss of consciousness.     EKGs/Labs/Other Studies Reviewed:    The following studies were reviewed today: Cardiac Studies & Procedures     STRESS TESTS  MYOCARDIAL PERFUSION IMAGING 04/26/2022  Narrative   The study is normal. The study is low risk.   No ST deviation was noted.   Left ventricular function is normal. Nuclear stress EF: 79 %. The left ventricular ejection fraction is hyperdynamic (>65%). End diastolic cavity size is normal. End systolic cavity size is normal.   Prior study not available for comparison.  Normal perfusion. LVEF 79% with normal wall motion. This is a low risk study. No prior for comparison.   ECHOCARDIOGRAM  ECHOCARDIOGRAM COMPLETE 11/14/2021  Narrative ECHOCARDIOGRAM REPORT    Patient Name:   ALANDRA BARA Date of Exam: 11/14/2021 Medical Rec #:  [id number]   Height:  62.0 in Accession #:    [id number]  Weight:       125.0 lb Date of  Birth:  August 11, 1951   BSA:          1.566 m Patient Age:    70 years    BP:           140/52 mmHg Patient Gender: F           HR:           82 bpm. Exam Location:  Inpatient  Procedure: 2D Echo, Cardiac Doppler and Color Doppler  Indications:    G45.9 TIA  History:        Patient has prior history of Echocardiogram examinations, most recent 08/14/2021. Stroke; Risk Factors:Diabetes and Hypertension. GERD.  Sonographer:    Festus Barren Referring Phys: [id number] ERIC CHEN  IMPRESSIONS   1. Left ventricular ejection fraction, by estimation, is >75%. The left ventricle has hyperdynamic function. The left ventricle has no regional wall motion abnormalities. There is mild left ventricular hypertrophy of the basal-septal segment. Left ventricular diastolic parameters are consistent with Grade I diastolic dysfunction (impaired relaxation). Elevated left atrial pressure. 2. Right ventricular systolic function is normal. The right ventricular size is normal. Tricuspid regurgitation signal is inadequate for assessing PA pressure. 3. The mitral valve is normal in structure. Trivial mitral valve regurgitation. No evidence of mitral stenosis. 4. The aortic valve is normal in structure. Aortic valve regurgitation is not visualized. No aortic stenosis is present.  FINDINGS Left Ventricle: Left ventricular ejection fraction, by estimation, is >75%. The left ventricle has hyperdynamic function. The left ventricle has no regional wall motion abnormalities. The left ventricular internal cavity size was normal in size. There is mild left ventricular hypertrophy of the basal-septal segment. Left ventricular diastolic parameters are consistent with Grade I diastolic dysfunction (impaired relaxation). Elevated left atrial pressure.  Right Ventricle: The right ventricular size is normal. Right ventricular systolic function is normal. Tricuspid regurgitation signal is inadequate for assessing PA pressure. The  tricuspid regurgitant velocity is 2.24 m/s, and with an assumed right atrial pressure of 3 mmHg, the estimated right ventricular systolic pressure is 23.1 mmHg.  Left Atrium: Left atrial size was normal in size.  Right Atrium: Right atrial size was normal in size.  Pericardium: There is no evidence of pericardial effusion.  Mitral Valve: The mitral valve is normal in structure. Trivial mitral valve regurgitation. No evidence of mitral valve stenosis.  Tricuspid Valve: The tricuspid valve is normal in structure. Tricuspid valve regurgitation is trivial. No evidence of tricuspid stenosis.  Aortic Valve: The aortic valve is normal in structure. Aortic valve regurgitation is not visualized. No aortic stenosis is present. Aortic valve mean gradient measures 4.0 mmHg. Aortic valve peak gradient measures 9.9 mmHg. Aortic valve area, by VTI measures 1.98 cm.  Pulmonic Valve: The pulmonic valve was normal in structure. Pulmonic valve regurgitation is not visualized. No evidence of pulmonic stenosis.  Aorta: The aortic root is normal in size and structure.  Venous: The inferior vena cava was not well visualized.  IAS/Shunts: The interatrial septum was not well visualized.   LEFT VENTRICLE PLAX 2D LVIDd:         3.70 cm     Diastology LVIDs:         3.50 cm     LV e' medial:    6.74 cm/s LV PW:         1.10 cm     LV E/e' medial:  17.8 LV IVS:        1.10 cm     LV e' lateral:   13.20 cm/s LVOT diam:     1.70 cm     LV E/e' lateral: 9.1 LV SV:         65 LV SV Index:   42 LVOT Area:     2.27 cm  LV Volumes (MOD) LV vol d, MOD A2C: 46.3 ml LV vol d, MOD A4C: 52.5 ml LV vol s, MOD A2C: 26.3 ml LV vol s, MOD A4C: 24.7 ml LV SV MOD A2C:     20.0 ml LV SV MOD A4C:     52.5 ml LV SV MOD BP:      25.7 ml  RIGHT VENTRICLE RV S prime:     12.90 cm/s TAPSE (M-mode): 1.4 cm  LEFT ATRIUM             Index        RIGHT ATRIUM          Index LA diam:        3.30 cm 2.11 cm/m   RA Area:      8.78 cm LA Vol (A2C):   44.8 ml 28.62 ml/m  RA Volume:   16.30 ml 10.41 ml/m LA Vol (A4C):   25.2 ml 16.10 ml/m LA Biplane Vol: 34.1 ml 21.78 ml/m AORTIC VALVE                    PULMONIC VALVE AV Area (Vmax):    1.88 cm     PV Vmax:        0.74 m/s AV Area (Vmean):   2.13 cm     PV Vmean:       52.100 cm/s AV Area (VTI):     1.98 cm     PV VTI:         0.157 m AV Vmax:           157.00 cm/s  PV Peak grad:   2.2 mmHg AV Vmean:          95.700 cm/s  PV Mean grad:   1.0 mmHg AV VTI:            0.329 m      RVOT Peak grad: 4 mmHg AV Peak Grad:      9.9 mmHg AV Mean Grad:      4.0 mmHg LVOT Vmax:         130.00 cm/s LVOT Vmean:        90.000 cm/s LVOT VTI:          0.287 m LVOT/AV VTI ratio: 0.87  AORTA Ao Root diam: 2.20 cm Ao Asc diam:  1.90 cm  MITRAL VALVE                TRICUSPID VALVE MV Area (PHT): 2.32 cm     TR Peak grad:   20.1 mmHg MV Decel Time: 327 msec     TR Mean grad:   15.0 mmHg MV E velocity: 120.00 cm/s  TR Vmax:        224.00 cm/s MV A velocity: 164.00 cm/s  TR Vmean:       190.0 cm/s MV E/A ratio:  0.73 SHUNTS Systemic VTI:  0.29 m Systemic Diam: 1.70 cm Pulmonic VTI:  0.215 m  Olga Millers MD Electronically signed by Olga Millers MD Signature Date/Time: 11/14/2021/1:38:23 PM    Final    MONITORS  CARDIAC EVENT MONITOR  06/04/2022  Narrative   Patient monitoring period was from 04/28/22-05/27/22   Predominant rhythm was NSR with average HR 86bpm   Rare SVE (<1%), rare VE (<1%)   No Afib, sustained arrhythmias or significant pauses  Laurance Flatten, MD   CT SCANS  CT CARDIAC SCORING (SELF PAY ONLY) 04/06/2022  Addendum 04/06/2022 10:16 AM ADDENDUM REPORT: 04/06/2022 10:14  EXAM: OVER-READ INTERPRETATION  CT CHEST  The following report is an over-read performed by radiologist Dr. Alver Fisher [facility] on 04/06/2022. This over-read does not include interpretation of cardiac or coronary anatomy or pathology.  The interpretation by the cardiologist is attached.  COMPARISON:  None.  FINDINGS: Atherosclerotic calcification of the aorta. Few pulmonary nodular densities measure 2 mm or less in size. No suspicious pulmonary nodules. No pleural fluid. Degenerative changes in the spine.  IMPRESSION: A few pulmonary nodular densities measure 2 mm or less in size. No follow-up needed if patient is low-risk (and has no known or suspected primary neoplasm). Non-contrast chest CT can be considered in 12 months if patient is high-risk. This recommendation follows the consensus statement: Guidelines for Management of Incidental Pulmonary Nodules Detected on CT Images: From the Fleischner Society 2017; Radiology 2017; 284:228-243.   Electronically Signed By: Leanna Battles M.D. On: 04/06/2022 10:14  Narrative CLINICAL DATA:  Cardiovascular Disease Risk stratification  EXAM: Coronary Calcium Score  MEDICATIONS: MEDICATIONS None  TECHNIQUE: A gated, non-contrast computed tomography scan of the heart was performed using 3mm slice thickness. Axial images were analyzed on a dedicated workstation. Calcium scoring of the coronary arteries was performed using the Agatston method.  FINDINGS: Coronary arteries: Normal origins.  Coronary Calcium Score:  Left main: 0  Left anterior descending artery: 74  Left circumflex artery: 68.8  Right coronary artery: 53.3  Total: 196  Percentile: 78th  Pericardium: Normal.  Ascending Aorta: Normal caliber.  Non-cardiac: See separate report from [facility].  IMPRESSION: Coronary calcium score of 196 Agatston units. This was 78th percentile for age-, race-, and sex-matched controls.  RECOMMENDATIONS: Coronary artery calcium (CAC) score is a strong predictor of incident coronary heart disease (CHD) and provides predictive information beyond traditional risk factors. CAC scoring is reasonable to use in the decision to withhold,  postpone, or initiate statin therapy in intermediate-risk or selected borderline-risk asymptomatic adults (age 22-75 years and LDL-C >=70 to <190 mg/dL) who do not have diabetes or established atherosclerotic cardiovascular disease (ASCVD).* In intermediate-risk (10-year ASCVD risk >=7.5% to <20%) adults or selected borderline-risk (10-year ASCVD risk >=5% to <7.5%) adults in whom a CAC score is measured for the purpose of making a treatment decision the following recommendations have been made:  If CAC=0, it is reasonable to withhold statin therapy and reassess in 5 to 10 years, as long as higher risk conditions are absent (diabetes mellitus, family history of premature CHD in first degree relatives (males <55 years; females <65 years), cigarette smoking, or LDL >=190 mg/dL).  If CAC is 1 to 99, it is reasonable to initiate statin therapy for patients >=37 years of age.  If CAC is >=100 or >=75th percentile, it is reasonable to initiate statin therapy at any age.  Cardiology referral should be considered for patients with CAC scores >=400 or >=75th percentile.  *2018 AHA/ACC/AACVPR/AAPA/ABC/ACPM/ADA/AGS/APhA/ASPC/NLA/PCNA Guideline on the Management of Blood Cholesterol: A Report of the American College of Cardiology/American Heart Association Task Force on Clinical Practice Guidelines. J Am Coll Cardiol. 2019;73(24):3168-3209.  Marca Ancona, MD  Electronically Signed: By: Marca Ancona  M.D. On: 04/05/2022 18:04           EKG:  No new ECG today  Recent Labs: 03/29/2022: Pro B Natriuretic peptide (BNP) 5.0; TSH 4.23 08/12/2022: ALT 20 08/15/2022: Hemoglobin 9.9; Platelets 215 08/18/2022: BUN <5; Creatinine, Ser 1.34; Magnesium 1.1; Potassium 4.6; Sodium 140  Recent Lipid Panel    Component Value Date/Time   CHOL 184 03/29/2022 1114   TRIG 146.0 03/29/2022 1114   HDL 45.10 03/29/2022 1114   CHOLHDL 4 03/29/2022 1114   VLDL 29.2 03/29/2022 1114   LDLCALC 110 (H)  03/29/2022 1114   LDLCALC 97 04/01/2020 1041   LDLDIRECT 141 (H) 03/04/2010 2002     Risk Assessment/Calculations:           Physical Exam:    VS:  BP 124/60   Pulse 94   Ht 5\' 2"  (1.575 m)   Wt 123 lb 12.8 oz (56.2 kg)   SpO2 99%   BMI 22.64 kg/m     Wt Readings from Last 3 Encounters:  12/27/22 123 lb 12.8 oz (56.2 kg)  08/31/22 116 lb (52.6 kg)  08/14/22 121 lb 7.6 oz (55.1 kg)     GEN:  Well nourished, well developed in no acute distress HEENT: Normal NECK: No JVD; +Left carotid bruit CARDIAC: RRR, 1/6 systolic murmur RESPIRATORY:  Clear to auscultation without rales, wheezing or rhonchi  ABDOMEN: Soft, non-tender, non-distended MUSCULOSKELETAL:  No edema; No deformity  SKIN: Warm and dry NEUROLOGIC:  Alert and oriented x 3 PSYCHIATRIC:  Normal affect   ASSESSMENT:    1. TIA (transient ischemic attack)   2. Stenosis of carotid artery, unspecified laterality   3. Cerebrovascular accident (CVA) due to stenosis of left middle cerebral artery (HCC)   4. Hyperlipidemia with target LDL less than 70   5. Atypical chest pain   6. Coronary artery disease involving native heart without angina pectoris, unspecified vessel or lesion type   7. Diabetes mellitus with coincident hypertension (HCC)   8. Essential hypertension, benign    PLAN:    In order of problems listed above:  #TIA with history of prior Stroke: Patient with hospitalization in 07/2021 for left sided numbness and weakness. CT head unremarkable. MRI with old stroke but no acute pathology. Now s/p stenting of LICA in 11/2021. -Continue ASA 81mg  daily -30d monitor with no Afib -Continue lipitor 40mg  daily  -Continue zetia 10mg  daily  #Carotid artery disease s/p left CEA and stenting on left in 11/2021 -Repeat carotid for monitoring -Follow-up with vascular as scheduled -Continue ASA 81mg  daily -Continue lipitor 40mg  daily  -Continue zetia 10mg  daily  #HTN: Well controlled at home. -Continue  amlodipine 5mg  daily -Continue edarbyclor 40-12.5mg  daily  #HLD: -Continue lipitor 40mg  daily  -Continue zetia 10mg  daily -Repeat lipids per PCP -Goal LDL<70  #DMII: -On Toujeo solostar, Farxiga 10 mg -Goal A1C<7; currently 8.5       Medication Adjustments/Labs and Tests Ordered: Current medicines are reviewed at length with the patient today.  Concerns regarding medicines are outlined above.  No orders of the defined types were placed in this encounter.  No orders of the defined types were placed in this encounter.   There are no Patient Instructions on file for this visit.    Signed, Meriam Sprague, MD  12/27/2022 2:29 PM    [facility]

## 2022-12-27 ENCOUNTER — Telehealth: Payer: Self-pay | Admitting: Internal Medicine

## 2022-12-27 ENCOUNTER — Encounter: Payer: Self-pay | Admitting: Cardiology

## 2022-12-27 ENCOUNTER — Ambulatory Visit: Payer: 59 | Attending: Cardiology | Admitting: Cardiology

## 2022-12-27 VITALS — BP 124/60 | HR 94 | Ht 62.0 in | Wt 123.8 lb

## 2022-12-27 DIAGNOSIS — I6529 Occlusion and stenosis of unspecified carotid artery: Secondary | ICD-10-CM

## 2022-12-27 DIAGNOSIS — Z794 Long term (current) use of insulin: Secondary | ICD-10-CM | POA: Diagnosis not present

## 2022-12-27 DIAGNOSIS — E785 Hyperlipidemia, unspecified: Secondary | ICD-10-CM | POA: Diagnosis not present

## 2022-12-27 DIAGNOSIS — G459 Transient cerebral ischemic attack, unspecified: Secondary | ICD-10-CM | POA: Diagnosis not present

## 2022-12-27 DIAGNOSIS — I63512 Cerebral infarction due to unspecified occlusion or stenosis of left middle cerebral artery: Secondary | ICD-10-CM | POA: Diagnosis not present

## 2022-12-27 DIAGNOSIS — E119 Type 2 diabetes mellitus without complications: Secondary | ICD-10-CM | POA: Diagnosis not present

## 2022-12-27 DIAGNOSIS — I2510 Atherosclerotic heart disease of native coronary artery without angina pectoris: Secondary | ICD-10-CM

## 2022-12-27 DIAGNOSIS — R0789 Other chest pain: Secondary | ICD-10-CM | POA: Diagnosis not present

## 2022-12-27 DIAGNOSIS — I10 Essential (primary) hypertension: Secondary | ICD-10-CM

## 2022-12-27 MED ORDER — ACCU-CHEK GUIDE VI STRP: ORAL_STRIP | 2 refills | Status: AC

## 2022-12-27 NOTE — Telephone Encounter (Signed)
Prescription Request  12/27/2022  LOV: 08/31/2022  What is the name of the medication or equipment? ACCU-CHEK GUIDE test strip   Have you contacted your pharmacy to request a refill? Yes   Which pharmacy would you like this sent to?  [facility] - [place], [place] - [address] [place] [address] Phone: [phone] Fax: [phone]    Patient notified that their request is being sent to the clinical staff for review and that they should receive a response within 2 business days.   Please advise at Mobile [phone] (mobile)

## 2022-12-27 NOTE — Patient Instructions (Signed)
Medication Instructions:   Your physician recommends that you continue on your current medications as directed. Please refer to the Current Medication list given to you today.  *If you need a refill on your cardiac medications before your next appointment, please call your pharmacy*   Testing/Procedures:  Your physician has requested that you have a carotid duplex. This test is an ultrasound of the carotid arteries in your neck. It looks at blood flow through these arteries that supply the brain with blood. Allow one hour for this exam. There are no restrictions or special instructions.    Follow-Up: At [facility], you and your health needs are our priority.  As part of our continuing mission to provide you with exceptional heart care, we have created designated Provider Care Teams.  These Care Teams include your primary Cardiologist (physician) and Advanced Practice Providers (APPs -  Physician Assistants and Nurse Practitioners) who all work together to provide you with the care you need, when you need it.  We recommend signing up for the patient portal called "MyChart".  Sign up information is provided on this After Visit Summary.  MyChart is used to connect with patients for Virtual Visits (Telemedicine).  Patients are able to view lab/test results, encounter notes, upcoming appointments, etc.  Non-urgent messages can be sent to your provider as well.   To learn more about what you can do with MyChart, go to [email or web address].    Your next appointment:   6 month(s)  Provider:   Dr. Pemberton   

## 2022-12-30 ENCOUNTER — Ambulatory Visit (HOSPITAL_COMMUNITY)
Admission: RE | Admit: 2022-12-30 | Discharge: 2022-12-30 | Disposition: A | Discharge status: Home / Self Care | Payer: 59 | Source: Ambulatory Visit | Attending: Cardiology | Admitting: Cardiology

## 2022-12-30 DIAGNOSIS — I63512 Cerebral infarction due to unspecified occlusion or stenosis of left middle cerebral artery: Secondary | ICD-10-CM | POA: Diagnosis not present

## 2022-12-30 DIAGNOSIS — I6529 Occlusion and stenosis of unspecified carotid artery: Secondary | ICD-10-CM | POA: Diagnosis not present

## 2022-12-31 ENCOUNTER — Ambulatory Visit (INDEPENDENT_AMBULATORY_CARE_PROVIDER_SITE_OTHER): Payer: 59 | Admitting: Internal Medicine

## 2022-12-31 ENCOUNTER — Encounter: Payer: Self-pay | Admitting: Internal Medicine

## 2022-12-31 VITALS — BP 130/60 | HR 85 | Ht 62.0 in | Wt 123.8 lb

## 2022-12-31 DIAGNOSIS — E042 Nontoxic multinodular goiter: Secondary | ICD-10-CM

## 2022-12-31 DIAGNOSIS — E118 Type 2 diabetes mellitus with unspecified complications: Secondary | ICD-10-CM | POA: Diagnosis not present

## 2022-12-31 DIAGNOSIS — Z794 Long term (current) use of insulin: Secondary | ICD-10-CM | POA: Diagnosis not present

## 2022-12-31 DIAGNOSIS — E119 Type 2 diabetes mellitus without complications: Secondary | ICD-10-CM

## 2022-12-31 DIAGNOSIS — Z7984 Long term (current) use of oral hypoglycemic drugs: Secondary | ICD-10-CM | POA: Diagnosis not present

## 2022-12-31 DIAGNOSIS — E039 Hypothyroidism, unspecified: Secondary | ICD-10-CM

## 2022-12-31 DIAGNOSIS — Z7985 Long-term (current) use of injectable non-insulin antidiabetic drugs: Secondary | ICD-10-CM | POA: Diagnosis not present

## 2022-12-31 LAB — POCT GLYCOSYLATED HEMOGLOBIN (HGB A1C): Hemoglobin A1C: 8.1 % — AB (ref 4.0–5.6)

## 2022-12-31 MED ORDER — TOUJEO SOLOSTAR 300 UNIT/ML ~~LOC~~ SOPN
20.0000 [IU] | PEN_INJECTOR | Freq: Every day | SUBCUTANEOUS | 3 refills | Status: DC
Start: 2022-12-31 — End: 2023-05-04

## 2022-12-31 NOTE — Progress Notes (Signed)
MEN patient ID: Sonia Skinner, female   DOB: Mar 17, 1951, 72 y.o.   MRN: [id number]  HPI: Sonia Skinner is a 72 y.o.-year-old female, returning for follow-up for DM2, dx in 1962, insulin-dependent since 2000, uncontrolled, with complications (CVA, left carotid stenosis, PAD, CKD stage IV, PN, history of hypoglycemia, history of nonketotic hyperglycemic state). Pt. previously saw Dr. Everardo All, but last visit with me 03/2022.  Interim history: No increased urination, blurry vision, nausea, chest pain. Since last visit, she was admitted 08/12/2022 with SBO. She still has constipation. She is now seeing a nephrologist - Dr. Melanee Spry at [facility]. Amlodipine dose decreased >> creatinine improved.  Reviewed HbA1c: Lab Results  Component Value Date   HGBA1C 8.9 (H) 03/29/2022   HGBA1C 8.1 (H) 02/26/2022   HGBA1C 7.8 (H) 11/14/2021   HGBA1C 8.0 (A) 11/02/2021   HGBA1C 8.5 (H) 08/15/2021   HGBA1C 7.2 (H) 04/07/2021   HGBA1C 10.2 (H) 04/01/2020   HGBA1C 7.3 (A) 06/20/2019   HGBA1C 8.5 (H) 12/06/2018   HGBA1C 9.3 (A) 09/18/2018   Pt is on a regimen of: - Farxiga 10 mg before breakfast - started 08/2021 - Basaglar >> NPH 35 >> Toujeo 30 >> 26 >> 20-22 >> 24-26 units at bedtime  - Ozempic 2 mg weekly  - Humalog 2-5 units 15 min before a meal with more carbs, but may need to take this before most meals - restarted 03/2022.  Pt checks her sugars more than 4 times a day with her Dexcom CGM:  Previously:   Prev.:   Lowest sugar was 40s >> 50s >> 40; she has hypoglycemia awareness at 70.  Highest sugar was 300s >> 290 >> 300.  In 2017, she had nonketotic hyperglycemic state.  Glucometer: Accu-Chek guide  - + CKD stage IV, last BUN/creatinine:  Lab Results  Component Value Date   BUN <5 (L) 08/18/2022   BUN <5 (L) 08/17/2022   CREATININE 1.34 (H) 08/18/2022   CREATININE 1.16 (H) 08/17/2022  Not on ACE inhibitor/ARB.  -+ HL; last set of lipids: Lab Results  Component Value Date   CHOL 184  03/29/2022   HDL 45.10 03/29/2022   LDLCALC 110 (H) 03/29/2022   LDLDIRECT 141 (H) 03/04/2010   TRIG 146.0 03/29/2022   CHOLHDL 4 03/29/2022  On Lipitor 40 mg daily. On Zetia 10 mg daily.  - last eye exam was 12/2022. No DR reportedly.   - no numbness and tingling in her feet.  Foot exam 05/03/2022.  She is on gabapentin 100 mg 3 times a day.  She has a history of multinodular goiter, diagnosed in 2017.  Latest thyroid ultrasound (11/09/2021) reviewed: Parenchymal Echotexture: Moderately heterogenous  Isthmus: Normal in size measuring 0.3 cm in diameter, unchanged  Right lobe: Normal in size measuring 5.0 x 1.8 x 1.0 cm, previously, 4.9 x 1.7 x 1.4 cm  Left lobe: Normal in size measuring 4.8 x 1.6 x 1.6 cm, previously, 5.3 x 2.0 x 1.9 cm _________________________________________________________   The approximately 1.2 x 1.0 x 0.9 cm isoechoic ill-defined nodule within the mid aspect the right lobe of the thyroid (labeled 1), is grossly unchanged compared to the 05/2016 examination, previously, 1.2 x 1.1 x 0.9 cm and is again noted to contain an internal partially shadowing macrocalcification. Imaging stability for greater than 5 years is indicative of a benign etiology.   The approximately 1.0 x 1.0 x 0.5 cm spongiform/benign-appearing nodule within the inferior, medial aspect of the right lobe of the thyroid (labeled 2),  is grossly unchanged compared to the 05/2016 examination, previously, 1.0 cm, and again does not meet criteria to recommend percutaneous sampling or continued dedicated follow-up.  _________________________________________________________   The approximately 2.5 x 1.5 x 1.3 cm isoechoic nodule within mid aspect the left lobe of the thyroid (labeled 3), is unchanged compared to the 05/2016 examination, previously, 2.5 x 1.8 x 1.3 cm and is again noted to contain an internal partially shadowing macrocalcification. Imaging stability for greater than 5 years  is indicative of a benign etiology.   IMPRESSION: 1. Similar findings of multinodular goiter. No worrisome new or enlarging thyroid nodules. 2. All discretely thyroid nodules are unchanged since the 05/2016 examination. By report, dominant bilateral thyroid nodules may have been biopsied at an outside institution. If so, correlation with previous biopsy results is advised. Otherwise, imaging stability for greater than 5 years is indicative of a benign etiology and as such percutaneous sampling and/or continued dedicated follow-up is not recommended.  Hypothyroidism:  She is on Synthroid 88 mcg daily: - in am - fasting - right before b'fast >> moved 30 minutes before breakfast - no calcium - no iron - no multivitamins - + PPIs  - Dexilant in am 30 min to 1h >> moved later in the day - not on Biotin  Reviewed most recent TFTs: Lab Results  Component Value Date   TSH 4.23 03/29/2022   TSH 3.75 02/25/2022   TSH 1.088 11/14/2021   TSH 11.69 (H) 11/02/2021   TSH 0.25 (L) 08/27/2021   TSH 0.053 (L) 08/15/2021   TSH 33.15 (H) 04/07/2021   TSH 4.06 04/01/2020   TSH 4.73 (H) 06/20/2019   TSH 27.16 (H) 12/06/2018   She is not on steroids or biotin.  She also has a history of GERD, headache, HTN, depression.  ROS: + see HPI Past Medical History:  Diagnosis Date   Ankle fracture    Left   Anxiety    Carotid artery occlusion    Closed fracture of left distal fibula 09/16/2017   Complication of anesthesia    ALLERY TO ESTER BASE   Depression    early 30s   Diabetes mellitus    INSULIN DEPENDENT   GERD (gastroesophageal reflux disease)    Headache    years ago   Hypertension    Pneumonia    Stroke [facility]) March 2015    left MCA infarct, slight weakness on left side   Thyroid disease    Past Surgical History:  Procedure Laterality Date   ABDOMINAL HYSTERECTOMY     ANTERIOR FIXATION AND POSTERIOR MICRODISCECTOMY CERVICAL SPINE  1999   CAROTID ENDARTERECTOMY Left  11-04-13   cea   ENDARTERECTOMY Left 11/04/2013   IR ANGIO INTRA EXTRACRAN SEL COM CAROTID INNOMINATE BILAT MOD SED  11/16/2021   IR ANGIO INTRA EXTRACRAN SEL COM CAROTID INNOMINATE UNI L MOD SED  11/19/2021   IR ANGIO VERTEBRAL SEL VERTEBRAL BILAT MOD SED  11/16/2021   IR CT HEAD LTD  11/19/2021   IR INTRAVSC STENT CERV CAROTID W/EMB-PROT MOD SED INCL ANGIO  11/19/2021   IR RADIOLOGIST EVAL & MGMT  12/13/2021   ORIF ANKLE FRACTURE Left 09/16/2017   Procedure: OPEN REDUCTION INTERNAL FIXATION (ORIF) LEFT ANKLE FRACTURE;  Surgeon: Teryl Lucy, MD;  Location: [facility] OR;  Service: Orthopedics;  Laterality: Left;   RADIOLOGY WITH ANESTHESIA N/A 11/19/2021   Procedure: Left carotid angioplasty with possible stenting;  Surgeon: Julieanne Cotton, MD;  Location: [facility] OR;  Service: Radiology;  Laterality: N/A;  Social History   Socioeconomic History   Marital status: Legally Separated    Spouse name: Not on file   Number of children: 0   Years of education: College   Highest education level: Not on file  Occupational History   Occupation: [employer]    Employer: [employer]  Tobacco Use   Smoking status: Former    Packs/day: 1.00    Years: 20.00    Additional pack years: 0.00    Total pack years: 20.00    Types: Cigarettes    Quit date: 11/01/2013    Years since quitting: 9.1   Smokeless tobacco: Never  Vaping Use   Vaping Use: Never used  Substance and Sexual Activity   Alcohol use: Not Currently    Alcohol/week: 1.0 - 2.0 standard drink of alcohol    Types: 1 - 2 Standard drinks or equivalent per week   Drug use: No   Sexual activity: Not Currently  Other Topics Concern   Not on file  Social History Narrative   Patient lives in 1 story home with sister.   Caffeine Use: 2 cups daily; sodas occasionally   Some college education   Works as Lawyer for [employer]   Social Determinants of Health   Financial Resource Strain: Low Risk  (01/28/2022)   Overall Financial Resource Strain (CARDIA)     Difficulty of Paying Living Expenses: Not hard at all  Food Insecurity: No Food Insecurity (08/19/2022)   Hunger Vital Sign    Worried About Running Out of Food in the Last Year: Never true    Ran Out of Food in the Last Year: Never true  Transportation Needs: No Transportation Needs (08/19/2022)   PRAPARE - Administrator, Civil Service (Medical): No    Lack of Transportation (Non-Medical): No  Physical Activity: Insufficiently Active (01/28/2022)   Exercise Vital Sign    Days of Exercise per Week: 1 day    Minutes of Exercise per Session: 30 min  Stress: No Stress Concern Present (01/28/2022)   [employer] of Occupational Health - Occupational Stress Questionnaire    Feeling of Stress : Not at all  Social Connections: Moderately Integrated (01/28/2022)   Social Connection and Isolation Panel [NHANES]    Frequency of Communication with Friends and Family: More than three times a week    Frequency of Social Gatherings with Friends and Family: More than three times a week    Attends Religious Services: More than 4 times per year    Active Member of [employer] or Organizations: Yes    Attends Banker Meetings: More than 4 times per year    Marital Status: Separated  Intimate Partner Violence: Not At Risk (08/14/2022)   Humiliation, Afraid, Rape, and Kick questionnaire    Fear of Current or Ex-Partner: No    Emotionally Abused: No    Physically Abused: No    Sexually Abused: No   Current Outpatient Medications on File Prior to Visit  Medication Sig Dispense Refill   Accu-Chek FastClix Lancets MISC Use to check blood sugar five times daily. 510 each 2   ACCU-CHEK GUIDE test strip Use to check blood sugar twice a day. E11.9 100 each 2   amLODipine (NORVASC) 10 MG tablet Take 1 tablet (10 mg total) by mouth daily. 90 tablet 3   aspirin 81 MG chewable tablet Chew 1 tablet (81 mg total) by mouth daily. 30 tablet 1   atorvastatin (LIPITOR) 40 MG tablet Take 1 tablet  by mouth daily. 90 tablet 0   brexpiprazole (REXULTI) 2 MG TABS tablet Take 1 tablet (2 mg total) by mouth daily. 30 tablet 1   buPROPion (WELLBUTRIN XL) 300 MG 24 hr tablet Take 1 tablet (300 mg total) by mouth daily. 30 tablet 1   Cholecalciferol (VITAMIN D3 PO) Take 1 tablet by mouth daily. Unsure of dose     Continuous Blood Gluc Receiver (DEXCOM G7 RECEIVER) DEVI 1 Act by Does not apply route daily. 2 each 5   Continuous Blood Gluc Sensor (DEXCOM G7 SENSOR) MISC 1 Act by Does not apply route daily. 2 each 5   Continuous Blood Gluc Sensor (DEXCOM G7 SENSOR) MISC 1 Act by Does not apply route daily. 2 each 5   Continuous Blood Gluc Sensor (DEXCOM G7 SENSOR) MISC USE AS DIRECTED TOPICALLY  EVERY  10  DAYS 18 each 1   dapagliflozin propanediol (FARXIGA) 10 MG TABS tablet Take 1 tablet (10 mg total) by mouth daily before breakfast. 90 tablet 3   dexlansoprazole (DEXILANT) 60 MG capsule Take 1 capsule by mouth daily. 90 capsule 0   EDARBYCLOR 40-12.5 MG TABS Take 1 tablet by mouth daily. 90 tablet 0   ezetimibe (ZETIA) 10 MG tablet Take 1 tablet by mouth once daily. 90 tablet 1   gabapentin (NEURONTIN) 100 MG capsule Take 1 capsule (100 mg total) by mouth 3 (three) times daily. 270 capsule 1   HUMALOG KWIKPEN 200 UNIT/ML KwikPen Inject 5 units under the skin three times daily with meals. 9 mL 0   HYDROcodone-acetaminophen (NORCO) 5-325 MG tablet Take 1 tablet by mouth every 6 (six) hours as needed for moderate pain. 100 tablet 0   insulin glargine, 1 Unit Dial, (TOUJEO SOLOSTAR) 300 UNIT/ML Solostar Pen Inject 20-22 Units into the skin daily. (Patient taking differently: Inject 20-22 Units into the skin daily. Per sliding scale) 18 mL 3   levothyroxine (SYNTHROID) 88 MCG tablet Take 1 tablet (88 mcg total) by mouth daily. 90 tablet 3   loperamide (IMODIUM) 2 MG capsule Take 1 capsule (2 mg total) by mouth 4 (four) times daily as needed for diarrhea or loose stools. 12 capsule 0   loratadine  (CLARITIN) 10 MG tablet Take 1 tablet (10 mg total) by mouth daily. 90 tablet 0   lubiprostone (AMITIZA) 24 MCG capsule Take 1 capsule (24 mcg total) by mouth 2 (two) times daily with a meal. 180 capsule 1   PARoxetine (PAXIL) 20 MG tablet Take 1 tablet (20 mg total) by mouth daily. 30 tablet 1   Semaglutide, 2 MG/DOSE, 8 MG/3ML SOPN Inject 2 mg as directed once a week. 9 mL 3   traZODone (DESYREL) 50 MG tablet Take 1 tablet (50 mg total) by mouth at bedtime as needed for sleep. 30 tablet 1   No current facility-administered medications on file prior to visit.   Allergies  Allergen Reactions   Anesthetics, Ester Anaphylaxis   Family History  Problem Relation Age of Onset   Diabetes Mother    Heart disease Mother        Before age 28   Cancer Father        Lung   Hypertension Sister    Diabetes Sister    Diabetes Sister    Diabetes Sister     PE: There were no vitals taken for this visit. Wt Readings from Last 3 Encounters:  12/27/22 123 lb 12.8 oz (56.2 kg)  08/31/22 116 lb (52.6 kg)  08/14/22 121 lb  7.6 oz (55.1 kg)   Constitutional: normal weight, in NAD Eyes: EOMI, no exophthalmos ENT: no thyromegaly, no cervical lymphadenopathy Cardiovascular: tachycardia, RR, No MRG Respiratory: CTA B Musculoskeletal: no deformities Skin: moist, warm, no rashes Neurological: no tremor with outstretched hands  ASSESSMENT: 1. DM2, insulin-dependent, uncontrolled, with complications - CVA - carotid stenosis - PAD - CKD stage 4 - PN - Hypoglycemia - Nonketotic hyperglycemic state in 2017  2.  Hypothyroidism  3.  Multinodular goiter  PLAN:  1. Patient with longstanding, uncontrolled, type 2 diabetes, on oral antidiabetic regimen with SGLT2 inhibitor, and also on injectable regimen with weekly GLP-1 receptor agonist and basal/bolus insulin, with still poor control.  At last visit, HbA1c was 8.9%, increased.  She was able to start the Dexcom G7 CGM right before the visit.   Reviewing her CGM traces, they were still quite fluctuating, mostly in the target range but with higher values after lunch and occasionally after dinner.  I suggested to add back Humalog before meals and to change the dose based on the size and consistency of her meals.  I did advise her to start with the meals with more carbs.  Since she described low blood sugars at night, I advised her to decrease the Toujeo dose further.  We continued [place] and Ozempic. CGM interpretation:  -At today's visit, we reviewed her CGM downloads: It appears that 77% of values are in target range (goal >70%), while 20%  are higher than 180 (goal <25%), and 3% are lower than 70 (goal <4%).  The calculated average blood sugar is140.  The projected HbA1c for the next 3 months (GMI) is 6.6%. -Reviewing the CGM trends, sugars appear to be dropping too much overnight, with lows in the morning and then increasing blood sugars after meals.  However, the majority of the blood sugars are fluctuating within the target range.  Upon questioning, patient is actually taking a higher dose of Tileshia than recommended so we will decrease this today.  Also, she is not taking rapid acting insulin before meals with more carbs, she is only taking it for correction, up to 3 units.  We discussed that while this is okay to continue, she also may need to take a slightly higher dose before a carb rich meal.  We can continue the rest of the regimen for now.  - I suggested to:  Patient Instructions  Please continue: - Farxiga 10 mg before breakfast - Ozempic 2 mg weekly  Change: - Humalog 3-5 units 15 min before a meal with more carbs  Decrease: - Toujeo 20-22 units daily  Please continue levothyroxine 88 mcg daily.  Take the thyroid hormone every day, with water, at least 30 minutes before breakfast, separated by at least 4 hours from: - acid reflux medications - calcium - iron - multivitamins  Please return in 4 months.  - we checked  her HbA1c: 8.1% (lower) - advised to check sugars at different times of the day - 4x a day, rotating check times - advised for yearly eye exams >> she is UTD - return to clinic in 4 months  2.  Hypothyroidism - latest thyroid labs reviewed with pt. >> normal: Lab Results  Component Value Date   TSH 4.23 03/29/2022  - she continues on LT4 88 mcg daily - pt feels good on this dose. - we discussed about taking the thyroid hormone every day, with water, >30 minutes before breakfast, separated by >4 hours from acid reflux medications, calcium, iron,  multivitamins. Pt. is taking it correctly now. - will check thyroid tests at next visit  3.  Multinodular goiter -no neck compression symptoms -Recent thyroid ultrasound showed stability of her nodules over 6 years -No further investigation is needed for this  Carlus Pavlov, MD PhD [facility]

## 2022-12-31 NOTE — Patient Instructions (Addendum)
Please continue: - Farxiga 10 mg before breakfast - Ozempic 2 mg weekly  Change: - Humalog 3-5 units 15 min before a meal with more carbs  Decrease: - Toujeo 20-22 units daily  Please continue levothyroxine 88 mcg daily.  Take the thyroid hormone every day, with water, at least 30 minutes before breakfast, separated by at least 4 hours from: - acid reflux medications - calcium - iron - multivitamins  Please return in 4 months.

## 2023-01-14 ENCOUNTER — Telehealth (HOSPITAL_BASED_OUTPATIENT_CLINIC_OR_DEPARTMENT_OTHER): Payer: 59 | Admitting: Psychiatry

## 2023-01-14 ENCOUNTER — Encounter (HOSPITAL_COMMUNITY): Payer: Self-pay | Admitting: Psychiatry

## 2023-01-14 VITALS — Wt 123.0 lb

## 2023-01-14 DIAGNOSIS — F331 Major depressive disorder, recurrent, moderate: Secondary | ICD-10-CM

## 2023-01-14 DIAGNOSIS — R413 Other amnesia: Secondary | ICD-10-CM | POA: Diagnosis not present

## 2023-01-14 DIAGNOSIS — F411 Generalized anxiety disorder: Secondary | ICD-10-CM

## 2023-01-14 DIAGNOSIS — R251 Tremor, unspecified: Secondary | ICD-10-CM | POA: Diagnosis not present

## 2023-01-14 MED ORDER — BENZTROPINE MESYLATE 0.5 MG PO TABS
0.5000 mg | ORAL_TABLET | Freq: Every day | ORAL | 1 refills | Status: DC
Start: 2023-01-14 — End: 2023-03-16

## 2023-01-14 MED ORDER — TRAZODONE HCL 50 MG PO TABS
50.0000 mg | ORAL_TABLET | Freq: Every evening | ORAL | 0 refills | Status: DC | PRN
Start: 2023-01-14 — End: 2023-03-01

## 2023-01-14 MED ORDER — BUPROPION HCL ER (XL) 300 MG PO TB24
300.0000 mg | ORAL_TABLET | Freq: Every day | ORAL | 1 refills | Status: DC
Start: 2023-01-14 — End: 2023-03-16

## 2023-01-14 MED ORDER — BREXPIPRAZOLE 1 MG PO TABS: 1.0000 mg | ORAL_TABLET | Freq: Every day | ORAL | 1 refills | Status: DC

## 2023-01-14 NOTE — Progress Notes (Signed)
[facility] MD Virtual Progress Note   Patient Location: Home Provider Location: Home Office  I connect with patient by video and verified that I am speaking with correct person by using two identifiers. I discussed the limitations of evaluation and management by telemedicine and the availability of in person appointments. I also discussed with the patient that there may be a patient responsible charge related to this service. The patient expressed understanding and agreed to proceed.  Sonia Skinner [id number] 72 y.o.  01/14/2023 10:12 AM  History of Present Illness:  Patient is evaluated by video session.  She does not like to be on camera and keep changing position of camera to avoid being seen.  She is taking REXULTI 2 mg which was increased on the last visit to help the depression.  She noticed depression is better reported feeling tired, sluggish, decreased energy, sleeping too much.  When I ask what time she is taking the REXULTI she mentioned in the morning.  She is also taking trazodone as needed.  She denies hallucinations or paranoia but is still have negative thoughts, ruminative thoughts.  She struggled with the memory.  She gets easily sensitive when question asked about her symptoms.  Her memory and collection remains an issue and sometimes she has difficulty with word finding.  She denies any crying spells, suicidal thoughts.  She noticed tremors lately around her lip but denies tremor in her hand.  She denies difficulty holding objects.  Recently she has a visit with her endocrinologist and her hemoglobin A1c 8.1.  However patient told that there is another reading taken by endocrinologist which shows hemoglobin A1c 6.6.  Patient back on Ozempic and she is hoping better numbers next time.  Her appetite is okay.  She feels lost few pounds though as per chart she gained 3 pounds.  She is not taking Paxil which was making her tired.  Patient lives with her husband.  She has  limited support and does not go outside unless it is important.  She does take care of her husband who is a stroke.  She is not interested in therapy.  Past Psychiatric History: H/O taking antidepressant on and off most of life. H/O overdose and inpatient at [facility]. Saw provider at [facility] and given the Strattera, Adderall, Zoloft, Celexa, Lamictal and Lexapro.  Never tested for ADHD. No h/o psychosis, mania or hallucination.  In 2001 moved to [facility] and given amitriptyline and Wellbutrin to stop smoking. Prozac and Paxil made tired and nausea.     Outpatient Encounter Medications as of 01/14/2023  Medication Sig   benztropine (COGENTIN) 0.5 MG tablet Take 1 tablet (0.5 mg total) by mouth at bedtime.   Accu-Chek FastClix Lancets MISC Use to check blood sugar five times daily.   ACCU-CHEK GUIDE test strip Use to check blood sugar twice a day. E11.9   amLODipine (NORVASC) 10 MG tablet Take 1 tablet (10 mg total) by mouth daily. (Patient taking differently: Take 5 mg by mouth daily.)   aspirin 81 MG chewable tablet Chew 1 tablet (81 mg total) by mouth daily.   atorvastatin (LIPITOR) 40 MG tablet Take 1 tablet by mouth daily.   brexpiprazole (REXULTI) 1 MG TABS tablet Take 1 tablet (1 mg total) by mouth daily.   buPROPion (WELLBUTRIN XL) 300 MG 24 hr tablet Take 1 tablet (300 mg total) by mouth daily.   Cholecalciferol (VITAMIN D3 PO) Take 1 tablet by mouth daily. Unsure of dose  Continuous Blood Gluc Receiver (DEXCOM G7 RECEIVER) DEVI 1 Act by Does not apply route daily.   Continuous Blood Gluc Sensor (DEXCOM G7 SENSOR) MISC 1 Act by Does not apply route daily.   Continuous Blood Gluc Sensor (DEXCOM G7 SENSOR) MISC 1 Act by Does not apply route daily.   Continuous Blood Gluc Sensor (DEXCOM G7 SENSOR) MISC USE AS DIRECTED TOPICALLY  EVERY  10  DAYS   dapagliflozin propanediol (FARXIGA) 10 MG TABS tablet Take 1 tablet (10 mg total) by mouth daily before breakfast.    dexlansoprazole (DEXILANT) 60 MG capsule Take 1 capsule by mouth daily.   EDARBYCLOR 40-12.5 MG TABS Take 1 tablet by mouth daily.   ezetimibe (ZETIA) 10 MG tablet Take 1 tablet by mouth once daily.   gabapentin (NEURONTIN) 100 MG capsule Take 1 capsule (100 mg total) by mouth 3 (three) times daily.   HUMALOG KWIKPEN 200 UNIT/ML KwikPen Inject 5 units under the skin three times daily with meals.   HYDROcodone-acetaminophen (NORCO) 5-325 MG tablet Take 1 tablet by mouth every 6 (six) hours as needed for moderate pain.   insulin glargine, 1 Unit Dial, (TOUJEO SOLOSTAR) 300 UNIT/ML Solostar Pen Inject 20-22 Units into the skin daily.   levothyroxine (SYNTHROID) 88 MCG tablet Take 1 tablet (88 mcg total) by mouth daily.   loperamide (IMODIUM) 2 MG capsule Take 1 capsule (2 mg total) by mouth 4 (four) times daily as needed for diarrhea or loose stools.   loratadine (CLARITIN) 10 MG tablet Take 1 tablet (10 mg total) by mouth daily.   lubiprostone (AMITIZA) 24 MCG capsule Take 1 capsule (24 mcg total) by mouth 2 (two) times daily with a meal.   Semaglutide, 2 MG/DOSE, 8 MG/3ML SOPN Inject 2 mg as directed once a week.   traZODone (DESYREL) 50 MG tablet Take 1 tablet (50 mg total) by mouth at bedtime as needed for sleep.   [DISCONTINUED] brexpiprazole (REXULTI) 2 MG TABS tablet Take 1 tablet (2 mg total) by mouth daily.   [DISCONTINUED] buPROPion (WELLBUTRIN XL) 300 MG 24 hr tablet Take 1 tablet (300 mg total) by mouth daily.   [DISCONTINUED] PARoxetine (PAXIL) 20 MG tablet Take 1 tablet (20 mg total) by mouth daily. (Patient not taking: Reported on 01/14/2023)   [DISCONTINUED] traZODone (DESYREL) 50 MG tablet Take 1 tablet (50 mg total) by mouth at bedtime as needed for sleep.   No facility-administered encounter medications on file as of 01/14/2023.    Recent Results (from the past 2160 hour(s))  Lab report - scanned     Status: None   Collection Time: 11/02/22  3:51 PM  Result Value Ref Range    Microalb Creat Ratio 3     Comment: Abstracted by HIM   EGFR 23.0    Creatinine, POC 156.9 mg/dL   Microalb Creat Ratio 53    Albumin, Urine POC 4.4   POCT glycosylated hemoglobin (Hb A1C)     Status: Abnormal   Collection Time: 12/31/22  3:29 PM  Result Value Ref Range   Hemoglobin A1C 8.1 (A) 4.0 - 5.6 %   HbA1c POC (<> result, manual entry)     HbA1c, POC (prediabetic range)     HbA1c, POC (controlled diabetic range)       Psychiatric Specialty Exam: Physical Exam  Review of Systems  Constitutional:  Positive for appetite change and diaphoresis.  Psychiatric/Behavioral:  Positive for decreased concentration and sleep disturbance. Negative for suicidal ideas.     Weight 123 lb (  55.8 kg).There is no height or weight on file to calculate BMI.  General Appearance: Fairly Groomed  Eye Contact:  Fair  Speech:  Slow  Volume:  Decreased  Mood:  Dysphoric, tired  Affect:  Restricted  Thought Process:  Descriptions of Associations: Intact  Orientation:  Full (Time, Place, and Person)  Thought Content:  Rumination  Suicidal Thoughts:  No  Homicidal Thoughts:  No  Memory:  Immediate;   Fair Recent;   Fair Remote;   Fair  Judgement:  Fair  Insight:  Shallow  Psychomotor Activity:  Tremor  Concentration:  Concentration: Fair and Attention Span: Fair  Recall:  [employer] of Knowledge:  Fair  Language:  Fair  Akathisia:  NA  Handed:  Right  AIMS (if indicated):     Assets:  Communication Skills Desire for Improvement Housing Transportation  ADL's:  Intact  Cognition:  Impaired,  Mild  Sleep:  too much but in day time     Assessment/Plan: Moderate episode of recurrent major depressive disorder (HCC) - Plan: buPROPion (WELLBUTRIN XL) 300 MG 24 hr tablet, brexpiprazole (REXULTI) 1 MG TABS tablet, traZODone (DESYREL) 50 MG tablet, benztropine (COGENTIN) 0.5 MG tablet  GAD (generalized anxiety disorder) - Plan: buPROPion (WELLBUTRIN XL) 300 MG 24 hr tablet, brexpiprazole  (REXULTI) 1 MG TABS tablet  Memory changes - Plan: buPROPion (WELLBUTRIN XL) 300 MG 24 hr tablet  Tremor - Plan: benztropine (COGENTIN) 0.5 MG tablet  I reviewed blood work results, current medication, psychosocial stressors.  She is complaining of excessive daytime sleep, sluggishness and tired and lately tremors in her lip.  Overall she feels her depression is not as bad.  She still struggle with memory issues.  I recommend to cut down the REXULTI 1 mg to help the side effects and tremors and I will also add low-dose Cogentin 0.5 mg to take at bedtime to help the tremors and sleep.  Recommend to take the trazodone only when she cannot sleep we will provide a 30-day prescription to take as needed trazodone 50 mg.  Continue Wellbutrin XL 300 mg daily.  I emphasized to discuss with her primary care to get a referral to neurology.  We have sent referral in the past but patient did not go to the appointment.  Recommend to call back if she has any question or any concern.  Follow-up in 2 months.   Follow Up Instructions:     I discussed the assessment and treatment plan with the patient. The patient was provided an opportunity to ask questions and all were answered. The patient agreed with the plan and demonstrated an understanding of the instructions.   The patient was advised to call back or seek an in-person evaluation if the symptoms worsen or if the condition fails to improve as anticipated.    Collaboration of Care: Other provider involved in patient's care AEB notes are available in epic to review.  Patient/Guardian was advised Release of Information must be obtained prior to any record release in order to collaborate their care with an outside provider. Patient/Guardian was advised if they have not already done so to contact the registration department to sign all necessary forms in order for [place] to release information regarding their care.   Consent: Patient/Guardian gives verbal consent  for treatment and assignment of benefits for services provided during this visit. Patient/Guardian expressed understanding and agreed to proceed.     I provided 29 minutes of non face to face time during this encounter.  Note: This document was prepared by [employer] voice dictation technology and any errors that results from this process are unintentional.    Cleotis Nipper, MD 01/14/2023

## 2023-01-18 ENCOUNTER — Encounter (HOSPITAL_BASED_OUTPATIENT_CLINIC_OR_DEPARTMENT_OTHER): Payer: Self-pay | Admitting: Emergency Medicine

## 2023-01-18 ENCOUNTER — Other Ambulatory Visit: Payer: Self-pay

## 2023-01-18 ENCOUNTER — Emergency Department (HOSPITAL_BASED_OUTPATIENT_CLINIC_OR_DEPARTMENT_OTHER): Payer: 59

## 2023-01-18 ENCOUNTER — Inpatient Hospital Stay (HOSPITAL_BASED_OUTPATIENT_CLINIC_OR_DEPARTMENT_OTHER)
Admission: EM | Admit: 2023-01-18 | Discharge: 2023-01-21 | DRG: 683 | Disposition: A | Discharge status: Home / Self Care | Payer: 59 | Attending: Internal Medicine | Admitting: Internal Medicine

## 2023-01-18 DIAGNOSIS — R531 Weakness: Secondary | ICD-10-CM

## 2023-01-18 DIAGNOSIS — Z7982 Long term (current) use of aspirin: Secondary | ICD-10-CM | POA: Diagnosis not present

## 2023-01-18 DIAGNOSIS — Z87892 Personal history of anaphylaxis: Secondary | ICD-10-CM

## 2023-01-18 DIAGNOSIS — Z794 Long term (current) use of insulin: Secondary | ICD-10-CM

## 2023-01-18 DIAGNOSIS — R6510 Systemic inflammatory response syndrome (SIRS) of non-infectious origin without acute organ dysfunction: Secondary | ICD-10-CM | POA: Diagnosis present

## 2023-01-18 DIAGNOSIS — Z87891 Personal history of nicotine dependence: Secondary | ICD-10-CM

## 2023-01-18 DIAGNOSIS — E079 Disorder of thyroid, unspecified: Secondary | ICD-10-CM | POA: Diagnosis present

## 2023-01-18 DIAGNOSIS — E440 Moderate protein-calorie malnutrition: Secondary | ICD-10-CM | POA: Diagnosis not present

## 2023-01-18 DIAGNOSIS — Z7984 Long term (current) use of oral hypoglycemic drugs: Secondary | ICD-10-CM

## 2023-01-18 DIAGNOSIS — N179 Acute kidney failure, unspecified: Secondary | ICD-10-CM | POA: Diagnosis not present

## 2023-01-18 DIAGNOSIS — E039 Hypothyroidism, unspecified: Secondary | ICD-10-CM | POA: Diagnosis not present

## 2023-01-18 DIAGNOSIS — R55 Syncope and collapse: Secondary | ICD-10-CM | POA: Diagnosis present

## 2023-01-18 DIAGNOSIS — I5032 Chronic diastolic (congestive) heart failure: Secondary | ICD-10-CM | POA: Diagnosis present

## 2023-01-18 DIAGNOSIS — S0083XA Contusion of other part of head, initial encounter: Secondary | ICD-10-CM | POA: Diagnosis not present

## 2023-01-18 DIAGNOSIS — Z79899 Other long term (current) drug therapy: Secondary | ICD-10-CM

## 2023-01-18 DIAGNOSIS — T383X5A Adverse effect of insulin and oral hypoglycemic [antidiabetic] drugs, initial encounter: Secondary | ICD-10-CM | POA: Diagnosis not present

## 2023-01-18 DIAGNOSIS — I110 Hypertensive heart disease with heart failure: Secondary | ICD-10-CM | POA: Diagnosis present

## 2023-01-18 DIAGNOSIS — Z7985 Long-term (current) use of injectable non-insulin antidiabetic drugs: Secondary | ICD-10-CM

## 2023-01-18 DIAGNOSIS — Z79890 Hormone replacement therapy (postmenopausal): Secondary | ICD-10-CM

## 2023-01-18 DIAGNOSIS — Z884 Allergy status to anesthetic agent status: Secondary | ICD-10-CM

## 2023-01-18 DIAGNOSIS — Z833 Family history of diabetes mellitus: Secondary | ICD-10-CM

## 2023-01-18 DIAGNOSIS — Z6822 Body mass index (BMI) 22.0-22.9, adult: Secondary | ICD-10-CM

## 2023-01-18 DIAGNOSIS — S0081XA Abrasion of other part of head, initial encounter: Secondary | ICD-10-CM | POA: Diagnosis present

## 2023-01-18 DIAGNOSIS — Z8249 Family history of ischemic heart disease and other diseases of the circulatory system: Secondary | ICD-10-CM

## 2023-01-18 DIAGNOSIS — S0990XA Unspecified injury of head, initial encounter: Principal | ICD-10-CM

## 2023-01-18 DIAGNOSIS — E875 Hyperkalemia: Secondary | ICD-10-CM | POA: Diagnosis not present

## 2023-01-18 DIAGNOSIS — I69354 Hemiplegia and hemiparesis following cerebral infarction affecting left non-dominant side: Secondary | ICD-10-CM | POA: Diagnosis not present

## 2023-01-18 DIAGNOSIS — W19XXXA Unspecified fall, initial encounter: Secondary | ICD-10-CM | POA: Diagnosis present

## 2023-01-18 DIAGNOSIS — E860 Dehydration: Secondary | ICD-10-CM | POA: Diagnosis present

## 2023-01-18 DIAGNOSIS — J309 Allergic rhinitis, unspecified: Secondary | ICD-10-CM | POA: Diagnosis not present

## 2023-01-18 DIAGNOSIS — N19 Unspecified kidney failure: Secondary | ICD-10-CM | POA: Diagnosis not present

## 2023-01-18 DIAGNOSIS — Z90710 Acquired absence of both cervix and uterus: Secondary | ICD-10-CM

## 2023-01-18 DIAGNOSIS — E119 Type 2 diabetes mellitus without complications: Secondary | ICD-10-CM

## 2023-01-18 DIAGNOSIS — F32A Depression, unspecified: Secondary | ICD-10-CM | POA: Diagnosis present

## 2023-01-18 DIAGNOSIS — Z8701 Personal history of pneumonia (recurrent): Secondary | ICD-10-CM

## 2023-01-18 DIAGNOSIS — Z8781 Personal history of (healed) traumatic fracture: Secondary | ICD-10-CM

## 2023-01-18 DIAGNOSIS — E1142 Type 2 diabetes mellitus with diabetic polyneuropathy: Secondary | ICD-10-CM | POA: Diagnosis present

## 2023-01-18 DIAGNOSIS — K219 Gastro-esophageal reflux disease without esophagitis: Secondary | ICD-10-CM | POA: Diagnosis present

## 2023-01-18 DIAGNOSIS — J301 Allergic rhinitis due to pollen: Secondary | ICD-10-CM | POA: Diagnosis not present

## 2023-01-18 LAB — CBC
HCT: 40.3 % (ref 36.0–46.0)
Hemoglobin: 12.3 g/dL (ref 12.0–15.0)
MCH: 26.6 pg (ref 26.0–34.0)
MCHC: 30.5 g/dL (ref 30.0–36.0)
MCV: 87.2 fL (ref 80.0–100.0)
Platelets: 191 10*3/uL (ref 150–400)
RBC: 4.62 MIL/uL (ref 3.87–5.11)
RDW: 15.5 % (ref 11.5–15.5)
WBC: 12.9 10*3/uL — ABNORMAL HIGH (ref 4.0–10.5)
nRBC: 0 % (ref 0.0–0.2)

## 2023-01-18 LAB — BASIC METABOLIC PANEL
Anion gap: 13 (ref 5–15)
BUN: 55 mg/dL — ABNORMAL HIGH (ref 8–23)
CO2: 16 mmol/L — ABNORMAL LOW (ref 22–32)
Calcium: 9.5 mg/dL (ref 8.9–10.3)
Chloride: 109 mmol/L (ref 98–111)
Creatinine, Ser: 2.5 mg/dL — ABNORMAL HIGH (ref 0.44–1.00)
GFR, Estimated: 20 mL/min — ABNORMAL LOW (ref >60)
Glucose, Bld: 169 mg/dL — ABNORMAL HIGH (ref 70–99)
Potassium: 5.5 mmol/L — ABNORMAL HIGH (ref 3.5–5.1)
Sodium: 138 mmol/L (ref 135–145)

## 2023-01-18 LAB — URINALYSIS, ROUTINE W REFLEX MICROSCOPIC
Bilirubin Urine: NEGATIVE
Glucose, UA: 100 mg/dL — AB
Hgb urine dipstick: NEGATIVE
Ketones, ur: NEGATIVE mg/dL
Nitrite: NEGATIVE
Protein, ur: NEGATIVE mg/dL
Specific Gravity, Urine: >=1.03 (ref 1.005–1.030)
pH: 5.5 (ref 5.0–8.0)

## 2023-01-18 LAB — URINALYSIS, MICROSCOPIC (REFLEX): RBC / HPF: NONE SEEN RBC/hpf (ref 0–5)

## 2023-01-18 LAB — CBG MONITORING, ED: Glucose-Capillary: 156 mg/dL — ABNORMAL HIGH (ref 70–99)

## 2023-01-18 LAB — MAGNESIUM: Magnesium: 2 mg/dL (ref 1.7–2.4)

## 2023-01-18 MED ORDER — SODIUM ZIRCONIUM CYCLOSILICATE 10 G PO PACK
10.0000 g | PACK | Freq: Once | ORAL | Status: AC
  Administered 2023-01-18: 10 g via ORAL
  Filled 2023-01-18: qty 1

## 2023-01-18 MED ORDER — SODIUM CHLORIDE 0.9 % IV SOLN: INTRAVENOUS | Status: DC

## 2023-01-18 MED ORDER — SODIUM CHLORIDE 0.9 % IV BOLUS
1000.0000 mL | Freq: Once | INTRAVENOUS | Status: AC
  Administered 2023-01-18: 1000 mL via INTRAVENOUS

## 2023-01-18 NOTE — ED Notes (Signed)
Light green blood tube submitted to lab, per lab request for Mg specimen.

## 2023-01-18 NOTE — ED Provider Notes (Addendum)
[place] [facility] Provider Note   CSN: [id number] Arrival date & time: 01/18/23  1757     History  Chief Complaint  Patient presents with   Marletta Lor    Sonia Skinner is a 72 y.o. female.  Patient with a fall on Monday at about 5 in the morning may have had loss of consciousness.  Patient with an abrasion on her forehead area.  Patient denies any neck pain any back pain no injuries extremities has some generalized weakness but no focal weakness.  Patient's been on Ozempic for her diabetes her endocrinologist a week ago cut it down but she still saying that she has been feeling weak and no appetite.  Her blood sugar was 187.  History of CVAs in the past.  But no speech or visual problems.  Past medical history for hypertension thyroid disease diabetes gastroesophageal reflux disease history of stroke in 2015 left MCA infarct slight weakness on the left side.  Past surgical history significant for endarterectomy in 2015 abdominal hysterectomy.  Open reduction internal fixation ankle fracture 2019 stent cervical carotid in 2023 patient is a former smoker quit 2015.       Home Medications Prior to Admission medications   Medication Sig Start Date End Date Taking? Authorizing Provider  Accu-Chek FastClix Lancets MISC Use to check blood sugar five times daily. 04/21/21   Etta Grandchild, MD  ACCU-CHEK GUIDE test strip Use to check blood sugar twice a day. E11.9 12/27/22   Etta Grandchild, MD  amLODipine (NORVASC) 10 MG tablet Take 1 tablet (10 mg total) by mouth daily. Patient taking differently: Take 5 mg by mouth daily. 04/27/22   Gaston Islam., NP  aspirin 81 MG chewable tablet Chew 1 tablet (81 mg total) by mouth daily. 11/21/21   Charlott Holler, MD  atorvastatin (LIPITOR) 40 MG tablet Take 1 tablet by mouth daily. 11/17/22   Etta Grandchild, MD  benztropine (COGENTIN) 0.5 MG tablet Take 1 tablet (0.5 mg total) by mouth at bedtime. 01/14/23 03/15/23   Arfeen, Phillips Grout, MD  brexpiprazole (REXULTI) 1 MG TABS tablet Take 1 tablet (1 mg total) by mouth daily. 01/14/23   Arfeen, Phillips Grout, MD  buPROPion (WELLBUTRIN XL) 300 MG 24 hr tablet Take 1 tablet (300 mg total) by mouth daily. 01/14/23   Arfeen, Phillips Grout, MD  Cholecalciferol (VITAMIN D3 PO) Take 1 tablet by mouth daily. Unsure of dose    [provider]  Continuous Blood Gluc Receiver (DEXCOM G7 RECEIVER) DEVI 1 Act by Does not apply route daily. 02/08/22   Etta Grandchild, MD  Continuous Blood Gluc Sensor (DEXCOM G7 SENSOR) MISC 1 Act by Does not apply route daily. 11/26/21   Etta Grandchild, MD  Continuous Blood Gluc Sensor (DEXCOM G7 SENSOR) MISC 1 Act by Does not apply route daily. 02/08/22   Etta Grandchild, MD  Continuous Blood Gluc Sensor (DEXCOM G7 SENSOR) MISC USE AS DIRECTED TOPICALLY  EVERY  10  DAYS 06/18/22   Etta Grandchild, MD  dapagliflozin propanediol (FARXIGA) 10 MG TABS tablet Take 1 tablet (10 mg total) by mouth daily before breakfast. 05/03/22   Carlus Pavlov, MD  dexlansoprazole (DEXILANT) 60 MG capsule Take 1 capsule by mouth daily. 11/17/22   Etta Grandchild, MD  EDARBYCLOR 40-12.5 MG TABS Take 1 tablet by mouth daily. 12/12/22   Etta Grandchild, MD  ezetimibe (ZETIA) 10 MG tablet Take 1 tablet by  mouth once daily. 12/12/22   Etta Grandchild, MD  gabapentin (NEURONTIN) 100 MG capsule Take 1 capsule (100 mg total) by mouth 3 (three) times daily. 02/08/22   Etta Grandchild, MD  HUMALOG KWIKPEN 200 UNIT/ML KwikPen Inject 5 units under the skin three times daily with meals. 12/12/22   Etta Grandchild, MD  HYDROcodone-acetaminophen (NORCO) 5-325 MG tablet Take 1 tablet by mouth every 6 (six) hours as needed for moderate pain. 12/17/22   Etta Grandchild, MD  insulin glargine, 1 Unit Dial, (TOUJEO SOLOSTAR) 300 UNIT/ML Solostar Pen Inject 20-22 Units into the skin daily. 12/31/22   Carlus Pavlov, MD  levothyroxine (SYNTHROID) 88 MCG tablet Take 1 tablet (88 mcg total) by mouth daily.  05/03/22   Carlus Pavlov, MD  loperamide (IMODIUM) 2 MG capsule Take 1 capsule (2 mg total) by mouth 4 (four) times daily as needed for diarrhea or loose stools. 02/17/22   Horton, Mayer Masker, MD  loratadine (CLARITIN) 10 MG tablet Take 1 tablet (10 mg total) by mouth daily. 04/21/21   Etta Grandchild, MD  lubiprostone (AMITIZA) 24 MCG capsule Take 1 capsule (24 mcg total) by mouth 2 (two) times daily with a meal. 08/31/22   Etta Grandchild, MD  Semaglutide, 2 MG/DOSE, 8 MG/3ML SOPN Inject 2 mg as directed once a week. 05/03/22   Carlus Pavlov, MD  traZODone (DESYREL) 50 MG tablet Take 1 tablet (50 mg total) by mouth at bedtime as needed for sleep. 01/14/23   Arfeen, Phillips Grout, MD      Allergies    Anesthetics, ester    Review of Systems   Review of Systems  Constitutional:  Positive for appetite change. Negative for chills and fever.  HENT:  Negative for ear pain and sore throat.   Eyes:  Negative for pain and visual disturbance.  Respiratory:  Negative for cough and shortness of breath.   Cardiovascular:  Negative for chest pain and palpitations.  Gastrointestinal:  Negative for abdominal pain and vomiting.  Genitourinary:  Negative for dysuria and hematuria.  Musculoskeletal:  Negative for arthralgias and back pain.  Skin:  Negative for color change and rash.  Neurological:  Positive for syncope and weakness. Negative for seizures.  All other systems reviewed and are negative.   Physical Exam Updated Vital Signs BP (!) 160/62   Pulse (!) 114   Temp 98.7 F (37.1 C) (Oral)   Resp 17   Ht 1.575 m (5\' 2" )   Wt 56 kg   SpO2 98%   BMI 22.58 kg/m  Physical Exam Vitals and nursing note reviewed.  Constitutional:      General: She is not in acute distress.    Appearance: Normal appearance. She is well-developed. She is not ill-appearing.  HENT:     Head: Normocephalic.     Comments: Linear abrasion to forehead area more so on the right.  Not infected.  Does appear that it  occurred a few days ago.    Mouth/Throat:     Mouth: Mucous membranes are dry.     Comments: Mucous membranes dry. Eyes:     Extraocular Movements: Extraocular movements intact.     Conjunctiva/sclera: Conjunctivae normal.     Pupils: Pupils are equal, round, and reactive to light.  Cardiovascular:     Rate and Rhythm: Normal rate and regular rhythm.     Heart sounds: No murmur heard. Pulmonary:     Effort: Pulmonary effort is normal. No respiratory distress.  Breath sounds: Normal breath sounds.  Abdominal:     Palpations: Abdomen is soft.     Tenderness: There is no abdominal tenderness.  Musculoskeletal:        General: No swelling.     Cervical back: Normal range of motion and neck supple.     Right lower leg: No edema.     Left lower leg: No edema.  Skin:    General: Skin is warm and dry.     Capillary Refill: Capillary refill takes less than 2 seconds.  Neurological:     Mental Status: She is alert and oriented to person, place, and time.  Psychiatric:        Mood and Affect: Mood normal.     ED Results / Procedures / Treatments   Labs (all labs ordered are listed, but only abnormal results are displayed) Labs Reviewed  BASIC METABOLIC PANEL - Abnormal; Notable for the following components:      Result Value   Potassium 5.5 (*)    CO2 16 (*)    Glucose, Bld 169 (*)    BUN 55 (*)    Creatinine, Ser 2.50 (*)    GFR, Estimated 20 (*)    All other components within normal limits  CBC - Abnormal; Notable for the following components:   WBC 12.9 (*)    All other components within normal limits  CBG MONITORING, ED - Abnormal; Notable for the following components:   Glucose-Capillary 156 (*)    All other components within normal limits  URINALYSIS, ROUTINE W REFLEX MICROSCOPIC    EKG None  Radiology DG Chest Port 1 View  Result Date: 01/18/2023 CLINICAL DATA:  Weakness.  Patient fell and struck head. EXAM: PORTABLE CHEST 1 VIEW COMPARISON:  08/31/2022  FINDINGS: Normal heart size and pulmonary vascularity. No focal airspace disease or consolidation in the lungs. No blunting of costophrenic angles. No pneumothorax. Mediastinal contours appear intact. Degenerative changes in the spine. Calcification of the aorta. IMPRESSION: No active disease. Electronically Signed   By: Burman Nieves M.D.   On: 01/18/2023 19:41   CT HEAD WO CONTRAST  Result Date: 01/18/2023 CLINICAL DATA:  Abrasion above the right eye after fall on Monday with possible loss of consciousness EXAM: CT HEAD WITHOUT CONTRAST TECHNIQUE: Contiguous axial images were obtained from the base of the skull through the vertex without intravenous contrast. RADIATION DOSE REDUCTION: This exam was performed according to the departmental dose-optimization program which includes automated exposure control, adjustment of the mA and/or kV according to patient size and/or use of iterative reconstruction technique. COMPARISON:  CT head 02/17/2022 FINDINGS: Brain: No intracranial hemorrhage, mass effect, or evidence of acute infarct. No hydrocephalus. No extra-axial fluid collection. Generalized cerebral atrophy. Ill-defined hypoattenuation within the cerebral white matter is nonspecific but consistent with chronic small vessel ischemic disease. Chronic left parietal infarct. Vascular: No hyperdense vessel. Intracranial arterial calcification. Skull: No fracture or focal lesion. Small contusion about the right forehead. Sinuses/Orbits: No acute finding. Frothy secretions left sphenoid sinus. Paranasal sinuses and mastoid air cells are otherwise well aerated. Other: None. IMPRESSION: 1. No acute intracranial abnormality. 2. Small contusion about the right forehead. No calvarial fracture. Electronically Signed   By: Minerva Fester M.D.   On: 01/18/2023 18:51    Procedures Procedures    Medications Ordered in ED Medications  0.9 %  sodium chloride infusion (has no administration in time range)  sodium  chloride 0.9 % bolus 1,000 mL (1,000 mLs Intravenous New Bag/Given 01/18/23  1901)  sodium zirconium cyclosilicate (LOKELMA) packet 10 g (10 g Oral Given 01/18/23 1951)    ED Course/ Medical Decision Making/ A&P                             Medical Decision Making Amount and/or Complexity of Data Reviewed Labs: ordered. Radiology: ordered.  Risk Prescription drug management. Decision regarding hospitalization.   CT head without any acute findings.  Small contusion right forehead area.   Fingerstick blood sugars 156.  Very reassuring.  Will give some IV fluids CBC and basic metabolic panel pending.   Patient's basic metabolic panel significant for potassium of 5.5 CO2 16 glucose 169 BUN 55 creatinine 2.5 GFR for 20.  CBC white count 12.9 hemoglobin 12.3 platelets are 191.  Portable chest x-ray no acute disease.  Patient's EKG without any widening of the QRS or any significant T wave elevation but may be some subtle.  Patient will be given Lokelma for the potassium of 5.5.  Patient will need admission for acute kidney injury.  Patient's GFR in January 2024 was 42 and creatinine was 1.34.  So this is significant change.  CRITICAL CARE Performed by: Vanetta Mulders Total critical care time: 40 minutes Critical care time was exclusive of separately billable procedures and treating other patients. Critical care was necessary to treat or prevent imminent or life-threatening deterioration. Critical care was time spent personally by me on the following activities: development of treatment plan with patient and/or surrogate as well as nursing, discussions with consultants, evaluation of patient's response to treatment, examination of patient, obtaining history from patient or surrogate, ordering and performing treatments and interventions, ordering and review of laboratory studies, ordering and review of radiographic studies, pulse oximetry and re-evaluation of patient's  condition.    Final Clinical Impression(s) / ED Diagnoses Final diagnoses:  Injury of head, initial encounter  Syncope, unspecified syncope type  AKI (acute kidney injury) (HCC)  Hyperkalemia    Rx / DC Orders ED Discharge Orders     None         Vanetta Mulders, MD 01/18/23 Lavell Anchors, MD 01/18/23 2014

## 2023-01-18 NOTE — Progress Notes (Signed)
This is a 72 year old female with past medical history of stroke in 2015 with residual left sided weakness, left carotid artery occlusion s/p left CEA, DM, HTN, anxiety and depression, fatigue, neuropathy, prior cervical spine anterior fixation with posterior microdiscectomy and thyroid disease.  She presented to [facility] ER with complaints of a syncopal episode Monday 5 AM.  She did hit her head and has an abrasion.  Per patient she was started on Ozempic and has not been feeling good since then.  She discussed this with her endocrinologist who decreased the dose of her Ozempic but her symptoms remain.  She continues to feel weak with no appetite.  She decided to go to the [facility] ER.  In the ER patient labs are significant for creatinine of 2.5, baseline 1.34 done 08/18/2022, BUN 55, prior BUN have been < 5.  Potassium 5.5, EKG with some mild elevated peak waves.  WBC 12.9, glucose 169.  CT head negative. Patient given 1L NS bolus and IV fluids started at 100 cc/hr, Lokelma by EDP.  Overnight admission requested for continued valuation of hyperkalemia and AKI.

## 2023-01-18 NOTE — ED Notes (Signed)
Pt to CT

## 2023-01-18 NOTE — ED Notes (Signed)
EDP Zackowski aware of pts elevated HR.

## 2023-01-18 NOTE — ED Notes (Signed)
[facility] called for transport @20 :50

## 2023-01-18 NOTE — ED Triage Notes (Signed)
Pt states that she fell Monday morning at 0500 when she tried to get up and go to the bathroom, believes it was a LOC, but is not sure, denies any other falls; has an abrasion above the right eye  Reports she has been taking Ozempic for her DM and has been having no appetite for the last month, this has been causing her to "get weaker and weaker"; has CGM that shows blood sugar is 187  Hx of CVA x 2

## 2023-01-19 ENCOUNTER — Encounter (HOSPITAL_COMMUNITY): Payer: Self-pay | Admitting: Family Medicine

## 2023-01-19 DIAGNOSIS — I110 Hypertensive heart disease with heart failure: Secondary | ICD-10-CM | POA: Diagnosis present

## 2023-01-19 DIAGNOSIS — E1142 Type 2 diabetes mellitus with diabetic polyneuropathy: Secondary | ICD-10-CM | POA: Diagnosis not present

## 2023-01-19 DIAGNOSIS — J309 Allergic rhinitis, unspecified: Secondary | ICD-10-CM | POA: Diagnosis present

## 2023-01-19 DIAGNOSIS — S0081XA Abrasion of other part of head, initial encounter: Secondary | ICD-10-CM | POA: Diagnosis present

## 2023-01-19 DIAGNOSIS — Z7984 Long term (current) use of oral hypoglycemic drugs: Secondary | ICD-10-CM | POA: Diagnosis not present

## 2023-01-19 DIAGNOSIS — T383X5A Adverse effect of insulin and oral hypoglycemic [antidiabetic] drugs, initial encounter: Secondary | ICD-10-CM | POA: Diagnosis present

## 2023-01-19 DIAGNOSIS — Z87892 Personal history of anaphylaxis: Secondary | ICD-10-CM | POA: Diagnosis not present

## 2023-01-19 DIAGNOSIS — E440 Moderate protein-calorie malnutrition: Secondary | ICD-10-CM | POA: Diagnosis present

## 2023-01-19 DIAGNOSIS — R6510 Systemic inflammatory response syndrome (SIRS) of non-infectious origin without acute organ dysfunction: Secondary | ICD-10-CM

## 2023-01-19 DIAGNOSIS — Z7985 Long-term (current) use of injectable non-insulin antidiabetic drugs: Secondary | ICD-10-CM | POA: Diagnosis not present

## 2023-01-19 DIAGNOSIS — Z7982 Long term (current) use of aspirin: Secondary | ICD-10-CM | POA: Diagnosis not present

## 2023-01-19 DIAGNOSIS — N179 Acute kidney failure, unspecified: Secondary | ICD-10-CM | POA: Diagnosis not present

## 2023-01-19 DIAGNOSIS — F32A Depression, unspecified: Secondary | ICD-10-CM | POA: Diagnosis not present

## 2023-01-19 DIAGNOSIS — Z8781 Personal history of (healed) traumatic fracture: Secondary | ICD-10-CM | POA: Diagnosis not present

## 2023-01-19 DIAGNOSIS — J301 Allergic rhinitis due to pollen: Secondary | ICD-10-CM | POA: Diagnosis not present

## 2023-01-19 DIAGNOSIS — N19 Unspecified kidney failure: Secondary | ICD-10-CM | POA: Diagnosis present

## 2023-01-19 DIAGNOSIS — Z794 Long term (current) use of insulin: Secondary | ICD-10-CM

## 2023-01-19 DIAGNOSIS — Z79890 Hormone replacement therapy (postmenopausal): Secondary | ICD-10-CM | POA: Diagnosis not present

## 2023-01-19 DIAGNOSIS — W19XXXA Unspecified fall, initial encounter: Secondary | ICD-10-CM | POA: Diagnosis present

## 2023-01-19 DIAGNOSIS — R55 Syncope and collapse: Secondary | ICD-10-CM

## 2023-01-19 DIAGNOSIS — E875 Hyperkalemia: Secondary | ICD-10-CM | POA: Diagnosis present

## 2023-01-19 DIAGNOSIS — I69354 Hemiplegia and hemiparesis following cerebral infarction affecting left non-dominant side: Secondary | ICD-10-CM | POA: Diagnosis not present

## 2023-01-19 DIAGNOSIS — E039 Hypothyroidism, unspecified: Secondary | ICD-10-CM | POA: Diagnosis not present

## 2023-01-19 DIAGNOSIS — I5032 Chronic diastolic (congestive) heart failure: Secondary | ICD-10-CM | POA: Diagnosis not present

## 2023-01-19 DIAGNOSIS — R531 Weakness: Secondary | ICD-10-CM | POA: Diagnosis not present

## 2023-01-19 DIAGNOSIS — Z79899 Other long term (current) drug therapy: Secondary | ICD-10-CM | POA: Diagnosis not present

## 2023-01-19 DIAGNOSIS — E860 Dehydration: Secondary | ICD-10-CM | POA: Diagnosis present

## 2023-01-19 DIAGNOSIS — Z6822 Body mass index (BMI) 22.0-22.9, adult: Secondary | ICD-10-CM | POA: Diagnosis not present

## 2023-01-19 LAB — BASIC METABOLIC PANEL
Anion gap: 15 (ref 5–15)
BUN: 49 mg/dL — ABNORMAL HIGH (ref 8–23)
CO2: 17 mmol/L — ABNORMAL LOW (ref 22–32)
Calcium: 9 mg/dL (ref 8.9–10.3)
Chloride: 106 mmol/L (ref 98–111)
Creatinine, Ser: 2.04 mg/dL — ABNORMAL HIGH (ref 0.44–1.00)
GFR, Estimated: 25 mL/min — ABNORMAL LOW (ref >60)
Glucose, Bld: 85 mg/dL (ref 70–99)
Potassium: 3.7 mmol/L (ref 3.5–5.1)
Sodium: 138 mmol/L (ref 135–145)

## 2023-01-19 LAB — CBC WITH DIFFERENTIAL/PLATELET
Abs Immature Granulocytes: 0.01 10*3/uL (ref 0.00–0.07)
Basophils Absolute: 0.1 10*3/uL (ref 0.0–0.1)
Basophils Relative: 1 %
Eosinophils Absolute: 0.2 10*3/uL (ref 0.0–0.5)
Eosinophils Relative: 3 %
HCT: 30.8 % — ABNORMAL LOW (ref 36.0–46.0)
Hemoglobin: 9.5 g/dL — ABNORMAL LOW (ref 12.0–15.0)
Immature Granulocytes: 0 %
Lymphocytes Relative: 44 %
Lymphs Abs: 3.9 10*3/uL (ref 0.7–4.0)
MCH: 26.7 pg (ref 26.0–34.0)
MCHC: 30.8 g/dL (ref 30.0–36.0)
MCV: 86.5 fL (ref 80.0–100.0)
Monocytes Absolute: 0.7 10*3/uL (ref 0.1–1.0)
Monocytes Relative: 8 %
Neutro Abs: 3.9 10*3/uL (ref 1.7–7.7)
Neutrophils Relative %: 44 %
Platelets: 182 10*3/uL (ref 150–400)
RBC: 3.56 MIL/uL — ABNORMAL LOW (ref 3.87–5.11)
RDW: 15 % (ref 11.5–15.5)
WBC: 8.8 10*3/uL (ref 4.0–10.5)
nRBC: 0 % (ref 0.0–0.2)

## 2023-01-19 LAB — GLUCOSE, CAPILLARY
Glucose-Capillary: 80 mg/dL (ref 70–99)
Glucose-Capillary: 109 mg/dL — ABNORMAL HIGH (ref 70–99)
Glucose-Capillary: 172 mg/dL — ABNORMAL HIGH (ref 70–99)
Glucose-Capillary: 219 mg/dL — ABNORMAL HIGH (ref 70–99)
Glucose-Capillary: 204 mg/dL — ABNORMAL HIGH (ref 70–99)

## 2023-01-19 LAB — CK: Total CK: 41 U/L (ref 38–234)

## 2023-01-19 LAB — VITAMIN B12: Vitamin B-12: 982 pg/mL — ABNORMAL HIGH (ref 180–914)

## 2023-01-19 LAB — TSH: TSH: 0.368 u[IU]/mL (ref 0.350–4.500)

## 2023-01-19 LAB — PHOSPHORUS: Phosphorus: 3.9 mg/dL (ref 2.5–4.6)

## 2023-01-19 LAB — MAGNESIUM: Magnesium: 1.8 mg/dL (ref 1.7–2.4)

## 2023-01-19 MED ORDER — INSULIN ASPART 100 UNIT/ML IJ SOLN: 0.0000 [IU] | Freq: Three times a day (TID) | INTRAMUSCULAR | Status: DC

## 2023-01-19 MED ORDER — BREXPIPRAZOLE 1 MG PO TABS
1.0000 mg | ORAL_TABLET | Freq: Every day | ORAL | Status: DC
  Administered 2023-01-19 – 2023-01-21 (×3): 1 mg via ORAL
  Filled 2023-01-19 (×3): qty 1

## 2023-01-19 MED ORDER — BENZTROPINE MESYLATE 0.5 MG PO TABS
0.5000 mg | ORAL_TABLET | Freq: Every day | ORAL | Status: DC
  Administered 2023-01-19 – 2023-01-20 (×2): 0.5 mg via ORAL
  Filled 2023-01-19 (×3): qty 1

## 2023-01-19 MED ORDER — BOOST / RESOURCE BREEZE PO LIQD CUSTOM
1.0000 | Freq: Three times a day (TID) | ORAL | Status: DC
  Administered 2023-01-19 – 2023-01-20 (×2): 1 via ORAL

## 2023-01-19 MED ORDER — BUPROPION HCL ER (XL) 150 MG PO TB24
300.0000 mg | ORAL_TABLET | Freq: Every day | ORAL | Status: DC
  Administered 2023-01-19 – 2023-01-21 (×3): 300 mg via ORAL
  Filled 2023-01-19 (×3): qty 2

## 2023-01-19 MED ORDER — METOPROLOL TARTRATE 5 MG/5ML IV SOLN: 5.0000 mg | INTRAVENOUS | Status: DC | PRN

## 2023-01-19 MED ORDER — AMLODIPINE BESYLATE 5 MG PO TABS
5.0000 mg | ORAL_TABLET | Freq: Every day | ORAL | Status: DC
  Administered 2023-01-19 – 2023-01-21 (×3): 5 mg via ORAL
  Filled 2023-01-19 (×3): qty 1

## 2023-01-19 MED ORDER — ACETAMINOPHEN 650 MG RE SUPP: 650.0000 mg | Freq: Four times a day (QID) | RECTAL | Status: DC | PRN

## 2023-01-19 MED ORDER — IPRATROPIUM-ALBUTEROL 0.5-2.5 (3) MG/3ML IN SOLN: 3.0000 mL | RESPIRATORY_TRACT | Status: DC | PRN

## 2023-01-19 MED ORDER — ASPIRIN 81 MG PO CHEW
81.0000 mg | CHEWABLE_TABLET | Freq: Every day | ORAL | Status: DC
  Administered 2023-01-19 – 2023-01-21 (×3): 81 mg via ORAL
  Filled 2023-01-19 (×3): qty 1

## 2023-01-19 MED ORDER — ACETAMINOPHEN 325 MG PO TABS: 650.0000 mg | ORAL_TABLET | Freq: Four times a day (QID) | ORAL | Status: DC | PRN

## 2023-01-19 MED ORDER — ONDANSETRON HCL 4 MG/2ML IJ SOLN: 4.0000 mg | Freq: Four times a day (QID) | INTRAMUSCULAR | Status: DC | PRN

## 2023-01-19 MED ORDER — BACITRACIN-NEOMYCIN-POLYMYXIN OINTMENT TUBE
TOPICAL_OINTMENT | Freq: Three times a day (TID) | CUTANEOUS | Status: DC
  Administered 2023-01-20 (×2): 1 via TOPICAL
  Filled 2023-01-19: qty 14

## 2023-01-19 MED ORDER — GUAIFENESIN 100 MG/5ML PO LIQD: 5.0000 mL | ORAL | Status: DC | PRN

## 2023-01-19 MED ORDER — INSULIN ASPART 100 UNIT/ML IJ SOLN
0.0000 [IU] | Freq: Three times a day (TID) | INTRAMUSCULAR | Status: DC
  Administered 2023-01-19: 2 [IU] via SUBCUTANEOUS
  Administered 2023-01-19 – 2023-01-20 (×3): 3 [IU] via SUBCUTANEOUS
  Administered 2023-01-20 (×2): 1 [IU] via SUBCUTANEOUS
  Administered 2023-01-20: 3 [IU] via SUBCUTANEOUS

## 2023-01-19 MED ORDER — LEVOTHYROXINE SODIUM 88 MCG PO TABS
88.0000 ug | ORAL_TABLET | Freq: Every day | ORAL | Status: DC
  Administered 2023-01-19 – 2023-01-21 (×3): 88 ug via ORAL
  Filled 2023-01-19 (×3): qty 1

## 2023-01-19 MED ORDER — MELATONIN 3 MG PO TABS: 3.0000 mg | ORAL_TABLET | Freq: Every evening | ORAL | Status: DC | PRN

## 2023-01-19 MED ORDER — INSULIN GLARGINE-YFGN 100 UNIT/ML ~~LOC~~ SOLN
8.0000 [IU] | Freq: Every morning | SUBCUTANEOUS | Status: DC
  Administered 2023-01-19 – 2023-01-21 (×3): 8 [IU] via SUBCUTANEOUS
  Filled 2023-01-19 (×3): qty 0.08

## 2023-01-19 MED ORDER — PANTOPRAZOLE SODIUM 40 MG PO TBEC
40.0000 mg | DELAYED_RELEASE_TABLET | Freq: Every day | ORAL | Status: DC
  Administered 2023-01-19 – 2023-01-21 (×3): 40 mg via ORAL
  Filled 2023-01-19 (×3): qty 1

## 2023-01-19 MED ORDER — TRAZODONE HCL 50 MG PO TABS: 50.0000 mg | ORAL_TABLET | Freq: Every evening | ORAL | Status: DC | PRN

## 2023-01-19 MED ORDER — SODIUM BICARBONATE 650 MG PO TABS
650.0000 mg | ORAL_TABLET | Freq: Three times a day (TID) | ORAL | Status: AC
  Administered 2023-01-19 (×3): 650 mg via ORAL
  Filled 2023-01-19 (×2): qty 1

## 2023-01-19 MED ORDER — ATORVASTATIN CALCIUM 40 MG PO TABS
40.0000 mg | ORAL_TABLET | Freq: Every day | ORAL | Status: DC
  Administered 2023-01-19 – 2023-01-21 (×3): 40 mg via ORAL
  Filled 2023-01-19 (×3): qty 1

## 2023-01-19 MED ORDER — EZETIMIBE 10 MG PO TABS
10.0000 mg | ORAL_TABLET | Freq: Every day | ORAL | Status: DC
  Administered 2023-01-19 – 2023-01-21 (×3): 10 mg via ORAL
  Filled 2023-01-19 (×3): qty 1

## 2023-01-19 MED ORDER — SENNOSIDES-DOCUSATE SODIUM 8.6-50 MG PO TABS: 1.0000 | ORAL_TABLET | Freq: Every evening | ORAL | Status: DC | PRN

## 2023-01-19 MED ORDER — LORATADINE 10 MG PO TABS
10.0000 mg | ORAL_TABLET | Freq: Every day | ORAL | Status: DC
  Administered 2023-01-19 – 2023-01-21 (×3): 10 mg via ORAL
  Filled 2023-01-19 (×3): qty 1

## 2023-01-19 MED ORDER — HYDRALAZINE HCL 20 MG/ML IJ SOLN: 10.0000 mg | INTRAMUSCULAR | Status: DC | PRN

## 2023-01-19 NOTE — Evaluation (Signed)
Occupational Therapy Evaluation Patient Details Name: Sonia Skinner MRN: [id number] DOB: 1951/06/21 Today's Date: 01/19/2023   History of Present Illness 72 yo female presents to [facility] on 6/4 with fall, LOC. Workup for AKI, dehydration. PMH includes GERD, HTN, R MCA CVA 2015, ORIF ankle fx 2019, DM with neuropathy on ozempic, ACDF, CAD with history of stenting and endarterectomy, former smoker.   Clinical Impression   Pt presenting s/p the above with deficits below. At baseline, pt receives assist from her sister with cooking and cleaning; pt does drive and works part time as a Lawyer. Upon eval, pt reporting her cognition feels foggy and that she has had recent difficulty with medication management, work Insurance account manager, and IADL. Pt found with mild memory and problem solving difficulties. Will follow acutely, and recommending OP OT to optimize safety and independence with ADL and IADL.       Recommendations for follow up therapy are one component of a multi-disciplinary discharge planning process, led by the attending physician.  Recommendations may be updated based on patient status, additional functional criteria and insurance authorization.   Assistance Recommended at Discharge Intermittent Supervision/Assistance  Patient can return home with the following Assist for transportation;Direct supervision/assist for financial management;Direct supervision/assist for medications management    Functional Status Assessment  Patient has had a recent decline in their functional status and demonstrates the ability to make significant improvements in function in a reasonable and predictable amount of time.  Equipment Recommendations  None recommended by OT    Recommendations for Other Services       Precautions / Restrictions Precautions Precautions: Other (comment) Precaution Comments: falls due to suspected hypotension, dehydration Restrictions Weight Bearing Restrictions: No       Mobility Bed Mobility               General bed mobility comments: up in chair    Transfers Overall transfer level: Modified independent Equipment used: None               General transfer comment: No physical assist needed      Balance Overall balance assessment: Needs assistance, History of Falls Sitting-balance support: No upper extremity supported, Feet supported Sitting balance-Leahy Scale: Good     Standing balance support: No upper extremity supported, During functional activity Standing balance-Leahy Scale: Good                             ADL either performed or assessed with clinical judgement   ADL Overall ADL's : Modified independent                                             Vision Baseline Vision/History: 0 No visual deficits Ability to See in Adequate Light: 0 Adequate Patient Visual Report: No change from baseline Vision Assessment?: No apparent visual deficits Additional Comments: Mild decr visual attention to L? Prior strokes, but pt reports no residual deficits     Perception Perception Perception Tested?: No   Praxis Praxis Praxis tested?: Not tested    Pertinent Vitals/Pain Pain Assessment Pain Assessment: No/denies pain     Hand Dominance Left   Extremity/Trunk Assessment Upper Extremity Assessment Upper Extremity Assessment: Defer to OT evaluation   Lower Extremity Assessment Lower Extremity Assessment: Defer to PT evaluation   Cervical / Trunk Assessment Cervical / Trunk  Assessment: Normal   Communication Communication Communication: Expressive difficulties (incr time)   Cognition Arousal/Alertness: Awake/alert Behavior During Therapy: WFL for tasks assessed/performed Overall Cognitive Status: Impaired/Different from baseline Area of Impairment: Memory, Problem solving                     Memory: Decreased short-term memory       Problem Solving: Slow processing,  Difficulty sequencing General Comments: Pt reporting her thinking feels foggy and that she has had recent difficulty managing her medications. Pt able to recall 3/3 words during delayed memory recall, one error (self corrected) when counting backward. Will continue to assess. Recommending OP OT due to recent difficulty with managing medication and on pt request     General Comments  VSS    Exercises     Shoulder Instructions      Home Living Family/patient expects to be discharged to:: Private residence Living Arrangements: Other relatives Available Help at Discharge: Family;Available 24 hours/day Type of Home: House Home Access: Level entry     Home Layout: One level     Bathroom Shower/Tub: Chief Strategy Officer: Standard     Home Equipment: None          Prior Functioning/Environment Prior Level of Function : Needs assist             Mobility Comments: No AD ADLs Comments: pt reports sister does cleaning and most of the cooking, can do basic cooking and cleaning tasks. 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        OT Problem List: Decreased cognition      OT Treatment/Interventions: Cognitive remediation/compensation;Self-care/ADL training;Therapeutic exercise;Patient/family education;Balance training    OT Goals(Current goals can be found in the care plan section) Acute Rehab OT Goals Patient Stated Goal: get OP OT OT Goal Formulation: With patient Time For Goal Achievement: 02/02/23 Potential to Achieve Goals: Good  OT Frequency: Min 2X/week    Co-evaluation              AM-PAC OT "6 Clicks" Daily Activity     Outcome Measure Help from another person eating meals?: None Help from another person taking care of personal grooming?: None Help from another person toileting, which includes using toliet, bedpan, or urinal?: None Help from another person bathing (including washing, rinsing, drying)?: None Help from another  person to put on and taking off regular upper body clothing?: None Help from another person to put on and taking off regular lower body clothing?: None 6 Click Score: 24   End of Session Equipment Utilized During Treatment: Gait belt Nurse Communication: Mobility status  Activity Tolerance: Patient tolerated treatment well Patient left: in chair;with call bell/phone within reach  OT Visit Diagnosis: Other symptoms and signs involving cognitive function                Time: 1610-9604 OT Time Calculation (min): 21 min Charges:  OT General Charges $OT Visit: 1 Visit OT Evaluation $OT Eval Low Complexity: 1 Low  Tyler Deis, OTR/L [facility] Acute Rehabilitation Office: [phone]   Myrla Halsted 01/19/2023, 1:32 PM

## 2023-01-19 NOTE — Progress Notes (Signed)
PROGRESS NOTE    Sonia Skinner  ZOX:[id number] DOB: 02-28-1951 DOA: 01/18/2023 PCP: Etta Grandchild, MD   Brief Narrative:  72 year old with history of DM2, ischemic CVA 2015 with left-sided hemiparesis, chronic diastolic CHF admitted to [facility] from 6/4 due to AKI and hyperkalemia.  She was recently started on Ozempic and since then due to nausea vomiting she has had poor oral intake and symptoms of presyncope.  Upon admission noted to have AKI and hyperkalemia.   Assessment & Plan:  Principal Problem:   AKI (acute kidney injury) (HCC) Active Problems:   DM2 (diabetes mellitus, type 2) (HCC)   Acquired hypothyroidism   Depression   Syncope   SIRS (systemic inflammatory response syndrome) (HCC)   Generalized weakness   Hyperkalemia   Dehydration   Acute prerenal azotemia   Allergic rhinitis   Chronic diastolic CHF (congestive heart failure) (HCC)    Acute kidney injury Moderate to severe dehydration, presyncope - Baseline creatinine 1.3, admission creatinine 2.5.  Suspect from dehydration.  Continue IV fluids, avoid nephrotoxic drugs.  Monitor renal function.  CT head negative for signs of pulm contusion -Monitor electrolytes.  Generalized weakness - PT/OT.  Depression - Continue outpatient medications-Wellbutrin and Cogentin  Insulin-dependent diabetes mellitus type 2 - Most recent A1c 8.1.  Currently on sliding scale and Accu-Chek.  Lantus 8 units daily. - Home Ozempic on hold  Hypothyroidism - Synthroid  Essential hypertension - Norvasc.  IV as needed.  Home nephrotoxic meds on hold.  Chronic diastolic CHF, preserved EF 75% on echo in 2023 - Currently appears to be dehydrated, getting gentle fluids.    PT/OT   DVT prophylaxis: SCDs Code Status: Full code Family Communication:   Status is: Inpatient Awaiting renal function to improve.  Still needs PT/OT.       Diet Orders (From admission, onward)     Start     Ordered   01/19/23 0348  Diet regular  Room service appropriate? Yes; Fluid consistency: Thin  Diet effective now       Question Answer Comment  Room service appropriate? Yes   Fluid consistency: Thin      01/19/23 0347            Subjective:  Overall still very weak.  Examination:  General exam: Appears calm and comfortable  Respiratory system: Clear to auscultation. Respiratory effort normal. Cardiovascular system: S1 & S2 heard, RRR. No JVD, murmurs, rubs, gallops or clicks. No pedal edema. Gastrointestinal system: Abdomen is nondistended, soft and nontender. No organomegaly or masses felt. Normal bowel sounds heard. Central nervous system: Alert and oriented. No focal neurological deficits. Extremities: Symmetric 5 x 5 power. Skin: No rashes, lesions or ulcers Psychiatry: Judgement and insight appear normal. Mood & affect appropriate.  Objective: Vitals:   01/18/23 2223 01/18/23 2300 01/19/23 0035 01/19/23 0451  BP:  (!) 139/55 (!) 141/61 (!) 146/57  Pulse:  (!) 107 (!) 109 (!) 107  Resp:  17 16 16   Temp: 98.7 F (37.1 C)  98.4 F (36.9 C) 98.8 F (37.1 C)  TempSrc: Oral  Oral Oral  SpO2:  100% 100% 98%  Weight:      Height:        Intake/Output Summary (Last 24 hours) at 01/19/2023 0736 Last data filed at 01/19/2023 0500 Gross per 24 hour  Intake 1098.83 ml  Output --  Net 1098.83 ml   Filed Weights   01/18/23 1811  Weight: 56 kg    Scheduled Meds:  amLODipine  5 mg Oral Daily   aspirin  81 mg Oral Daily   atorvastatin  40 mg Oral Daily   benztropine  0.5 mg Oral QHS   brexpiprazole  1 mg Oral Daily   buPROPion  300 mg Oral Daily   ezetimibe  10 mg Oral Daily   feeding supplement  1 Container Oral TID BM   insulin aspart  0-9 Units Subcutaneous TID WC   insulin glargine-yfgn  8 Units Subcutaneous q morning   levothyroxine  88 mcg Oral QAC breakfast   loratadine  10 mg Oral Daily   pantoprazole  40 mg Oral Daily   Continuous Infusions:  sodium chloride 100 mL/hr at 01/18/23 2218     Nutritional status     Body mass index is 22.58 kg/m.  Data Reviewed:   CBC: Recent Labs  Lab 01/18/23 1845  WBC 12.9*  HGB 12.3  HCT 40.3  MCV 87.2  PLT 191   Basic Metabolic Panel: Recent Labs  Lab 01/18/23 1845 01/18/23 2216  NA 138  --   K 5.5*  --   CL 109  --   CO2 16*  --   GLUCOSE 169*  --   BUN 55*  --   CREATININE 2.50*  --   CALCIUM 9.5  --   MG  --  2.0   GFR: Estimated Creatinine Clearance: 15.9 mL/min (A) (by C-G formula based on SCr of 2.5 mg/dL (H)). Liver Function Tests: No results for input(s): "AST", "ALT", "ALKPHOS", "BILITOT", "PROT", "ALBUMIN" in the last 168 hours. No results for input(s): "LIPASE", "AMYLASE" in the last 168 hours. No results for input(s): "AMMONIA" in the last 168 hours. Coagulation Profile: No results for input(s): "INR", "PROTIME" in the last 168 hours. Cardiac Enzymes: No results for input(s): "CKTOTAL", "CKMB", "CKMBINDEX", "TROPONINI" in the last 168 hours. BNP (last 3 results) Recent Labs    03/29/22 1114  PROBNP 5.0   HbA1C: No results for input(s): "HGBA1C" in the last 72 hours. CBG: Recent Labs  Lab 01/18/23 1812  GLUCAP 156*   Lipid Profile: No results for input(s): "CHOL", "HDL", "LDLCALC", "TRIG", "CHOLHDL", "LDLDIRECT" in the last 72 hours. Thyroid Function Tests: No results for input(s): "TSH", "T4TOTAL", "FREET4", "T3FREE", "THYROIDAB" in the last 72 hours. Anemia Panel: No results for input(s): "VITAMINB12", "FOLATE", "FERRITIN", "TIBC", "IRON", "RETICCTPCT" in the last 72 hours. Sepsis Labs: No results for input(s): "PROCALCITON", "LATICACIDVEN" in the last 168 hours.  No results found for this or any previous visit (from the past 240 hour(s)).       Radiology Studies: DG Chest Port 1 View  Result Date: 01/18/2023 CLINICAL DATA:  Weakness.  Patient fell and struck head. EXAM: PORTABLE CHEST 1 VIEW COMPARISON:  08/31/2022 FINDINGS: Normal heart size and pulmonary vascularity. No  focal airspace disease or consolidation in the lungs. No blunting of costophrenic angles. No pneumothorax. Mediastinal contours appear intact. Degenerative changes in the spine. Calcification of the aorta. IMPRESSION: No active disease. Electronically Signed   By: Burman Nieves M.D.   On: 01/18/2023 19:41   CT HEAD WO CONTRAST  Result Date: 01/18/2023 CLINICAL DATA:  Abrasion above the right eye after fall on Monday with possible loss of consciousness EXAM: CT HEAD WITHOUT CONTRAST TECHNIQUE: Contiguous axial images were obtained from the base of the skull through the vertex without intravenous contrast. RADIATION DOSE REDUCTION: This exam was performed according to the departmental dose-optimization program which includes automated exposure control, adjustment of the mA and/or kV according to patient  size and/or use of iterative reconstruction technique. COMPARISON:  CT head 02/17/2022 FINDINGS: Brain: No intracranial hemorrhage, mass effect, or evidence of acute infarct. No hydrocephalus. No extra-axial fluid collection. Generalized cerebral atrophy. Ill-defined hypoattenuation within the cerebral white matter is nonspecific but consistent with chronic small vessel ischemic disease. Chronic left parietal infarct. Vascular: No hyperdense vessel. Intracranial arterial calcification. Skull: No fracture or focal lesion. Small contusion about the right forehead. Sinuses/Orbits: No acute finding. Frothy secretions left sphenoid sinus. Paranasal sinuses and mastoid air cells are otherwise well aerated. Other: None. IMPRESSION: 1. No acute intracranial abnormality. 2. Small contusion about the right forehead. No calvarial fracture. Electronically Signed   By: Minerva Fester M.D.   On: 01/18/2023 18:51           LOS: 0 days   Time spent= 35 mins    Libby Goehring Joline Maxcy, MD [facility]  If 7PM-7AM, please contact night-coverage  01/19/2023, 7:36 AM

## 2023-01-19 NOTE — Progress Notes (Signed)
Patient's DBP is 42 and there is an order to notify provider if DBP is less than 60. [facility] on call has been notified and I am awaiting response.

## 2023-01-19 NOTE — H&P (Signed)
History and Physical      TWYLAH NUDING HYQ:[id number] DOB: 31-Mar-1951 DOA: 01/18/2023; DOS: 01/19/2023  PCP: Etta Grandchild, MD  Patient coming from: home   I have personally briefly reviewed patient's old medical records in [facility]  Chief Complaint: syncope  HPI: Sonia Skinner is a 72 y.o. female with medical history significant for type 2 diabetes mellitus, ischemic stroke in 2015 with residual left hemiparesis, chronic diastolic heart failure, who is admitted to [facility] on 01/18/2023 by way of transfer from [facility] With acute kidney injury after presenting from home to the latter facility complaining of a single episode of syncope.    The patient conveys that she was recently started on Ozempic.  Since the time of initiation of the medication, she notes intermittent nausea resulting in intermittent episodes of nonbloody, nonbilious emesis.  She is also noted decline in appetite resulting in decline in oral intake, including that of water over the last 1 to 2 weeks.  On Monday, 01/17/2023, the patient reports that as she was attempting to rise from a seated to standing position, she became dizzy, lightheaded, with the subjective sensation of impending loss of consciousness, before actually losing consciousness and falling to the floor.  She believes that she hit her head on the floor as a component of this episode.  While this was an unwitnessed episode, she believes that she remained unconscious for only few seconds, before being able to extricate herself from the floor.  She denies any residual headache, neck pain, or acute arthralgias or myalgias as a consequence of this fall.  No additional episodes of syncope or presyncope.  This episode is not associate with any tongue biting or loss of bowel/bladder function.  She acknowledges a history of stroke in 2015 with residual left-sided weakness.  Relative to this baseline, she denies any acute focal weakness or any acute  focal numbness, paresthesias, vertigo, dysphagia, dysarthria, facial droop, or acute change in vision.  Rather, she reports generalized weakness over the course of the last week.  She denies any associated, immediately preceding preceding, or ensuing chest pain, shortness of breath.   No recent subjective fever, chills, rigors, or generalized myalgias.  No recent dysuria or gross hematuria.  She denies any recent cough, rash.  Her most recent echocardiogram occurred in April 2023 was notable for LVEF greater than 75%, no focal motion arise, grade 1 diastolic dysfunction and normal right ventricular systolic function and trivial mitral regurgitation.  Her outpatient medications include as losartan and chlorthalidone.     [facility] ED Course:  Vital signs in the ED were notable for the following: Afebrile; initial heart rates in the 1 teens consistently decreasing into the low 100s following interval initiation of IV fluids, as further quantified below; systolic blood pressures in the 1 teens to 150s; respiratory rate 16-19, oxygen saturation 97 to 100% on room air.  Labs were notable for the following: BMP notable for potassium 5.5, creatinine 2.50 compared to most recent prior serum creatinine data point of 1.34 on 08/18/2022, BUN current ratio 22, glucose 169.  CBC notable for will with cell count 2900 with hemoglobin 12.3.  Urinalysis showed no white blood cells, was nitrate negative, was negative for protein, no RBCs, and was associate with specific gravity of greater than 1.030.  Per my interpretation, EKG in ED demonstrated the following: Sinus tachycardia with multiple PVCs, heart rate 115, no evidence of T wave ST changes,  including no evidence of ST elevation.  Imaging and additional notable ED work-up: 1 view chest x-ray, performed radiology read, shows no evidence of acute cardiopulmonary process, clearness of infiltrate, edema, effusion, or pneumothorax.  Noncontrast CT head,  perform radiology, shows no evidence of acute intracranial process, Cleen evidence of intracranial hemorrhage or any evidence of acute infarct.  While in the ED, the following were administered: Lokelma 10 g p.o. x 1 dose, normal saline x 1 L bolus.  Subsequently, the patient was admitted to [facility] for further evaluation management of presenting acute kidney injury complicated by hyperkalemia, in the setting of recent syncope episode in the setting of prodrome, with presentation also notable for generalized weakness and clinical evidence of dehydration.     Review of Systems: As per HPI otherwise 10 point review of systems negative.   Past Medical History:  Diagnosis Date   Ankle fracture    Left   Anxiety    Carotid artery occlusion    Closed fracture of left distal fibula 09/16/2017   Complication of anesthesia    ALLERY TO ESTER BASE   Depression    early 30s   Diabetes mellitus    INSULIN DEPENDENT   GERD (gastroesophageal reflux disease)    Headache    years ago   Hypertension    Pneumonia    Stroke [facility]) March 2015    left MCA infarct, slight weakness on left side   Thyroid disease     Past Surgical History:  Procedure Laterality Date   ABDOMINAL HYSTERECTOMY     ANTERIOR FIXATION AND POSTERIOR MICRODISCECTOMY CERVICAL SPINE  1999   CAROTID ENDARTERECTOMY Left 11-04-13   cea   ENDARTERECTOMY Left 11/04/2013   IR ANGIO INTRA EXTRACRAN SEL COM CAROTID INNOMINATE BILAT MOD SED  11/16/2021   IR ANGIO INTRA EXTRACRAN SEL COM CAROTID INNOMINATE UNI L MOD SED  11/19/2021   IR ANGIO VERTEBRAL SEL VERTEBRAL BILAT MOD SED  11/16/2021   IR CT HEAD LTD  11/19/2021   IR INTRAVSC STENT CERV CAROTID W/EMB-PROT MOD SED INCL ANGIO  11/19/2021   IR RADIOLOGIST EVAL & MGMT  12/13/2021   ORIF ANKLE FRACTURE Left 09/16/2017   Procedure: OPEN REDUCTION INTERNAL FIXATION (ORIF) LEFT ANKLE FRACTURE;  Surgeon: Teryl Lucy, MD;  Location: [facility] OR;  Service: Orthopedics;  Laterality: Left;   RADIOLOGY  WITH ANESTHESIA N/A 11/19/2021   Procedure: Left carotid angioplasty with possible stenting;  Surgeon: Julieanne Cotton, MD;  Location: [facility] OR;  Service: Radiology;  Laterality: N/A;    Social History:  reports that she quit smoking about 9 years ago. Her smoking use included cigarettes. She has a 20.00 pack-year smoking history. She has never used smokeless tobacco. She reports that she does not currently use alcohol after a past usage of about 1.0 - 2.0 standard drink of alcohol per week. She reports that she does not use drugs.   Allergies  Allergen Reactions   Anesthetics, Ester Anaphylaxis    Family History  Problem Relation Age of Onset   Diabetes Mother    Heart disease Mother        Before age 72   Cancer Father        Lung   Hypertension Sister    Diabetes Sister    Diabetes Sister    Diabetes Sister     Family history reviewed and not pertinent    Prior to Admission medications   Medication Sig Start Date End Date Taking? Authorizing Provider  Accu-Chek [employer]  Lancets MISC Use to check blood sugar five times daily. 04/21/21   Etta Grandchild, MD  ACCU-CHEK GUIDE test strip Use to check blood sugar twice a day. E11.9 12/27/22   Etta Grandchild, MD  amLODipine (NORVASC) 10 MG tablet Take 1 tablet (10 mg total) by mouth daily. Patient taking differently: Take 5 mg by mouth daily. 04/27/22   Gaston Islam., NP  aspirin 81 MG chewable tablet Chew 1 tablet (81 mg total) by mouth daily. 11/21/21   Charlott Holler, MD  atorvastatin (LIPITOR) 40 MG tablet Take 1 tablet by mouth daily. 11/17/22   Etta Grandchild, MD  benztropine (COGENTIN) 0.5 MG tablet Take 1 tablet (0.5 mg total) by mouth at bedtime. 01/14/23 03/15/23  Arfeen, Phillips Grout, MD  brexpiprazole (REXULTI) 1 MG TABS tablet Take 1 tablet (1 mg total) by mouth daily. 01/14/23   Arfeen, Phillips Grout, MD  buPROPion (WELLBUTRIN XL) 300 MG 24 hr tablet Take 1 tablet (300 mg total) by mouth daily. 01/14/23   Arfeen, Phillips Grout, MD   Cholecalciferol (VITAMIN D3 PO) Take 1 tablet by mouth daily. Unsure of dose    [provider]  Continuous Blood Gluc Receiver (DEXCOM G7 RECEIVER) DEVI 1 Act by Does not apply route daily. 02/08/22   Etta Grandchild, MD  Continuous Blood Gluc Sensor (DEXCOM G7 SENSOR) MISC 1 Act by Does not apply route daily. 11/26/21   Etta Grandchild, MD  Continuous Blood Gluc Sensor (DEXCOM G7 SENSOR) MISC 1 Act by Does not apply route daily. 02/08/22   Etta Grandchild, MD  Continuous Blood Gluc Sensor (DEXCOM G7 SENSOR) MISC USE AS DIRECTED TOPICALLY  EVERY  10  DAYS 06/18/22   Etta Grandchild, MD  dapagliflozin propanediol (FARXIGA) 10 MG TABS tablet Take 1 tablet (10 mg total) by mouth daily before breakfast. 05/03/22   Carlus Pavlov, MD  dexlansoprazole (DEXILANT) 60 MG capsule Take 1 capsule by mouth daily. 11/17/22   Etta Grandchild, MD  EDARBYCLOR 40-12.5 MG TABS Take 1 tablet by mouth daily. 12/12/22   Etta Grandchild, MD  ezetimibe (ZETIA) 10 MG tablet Take 1 tablet by mouth once daily. 12/12/22   Etta Grandchild, MD  gabapentin (NEURONTIN) 100 MG capsule Take 1 capsule (100 mg total) by mouth 3 (three) times daily. 02/08/22   Etta Grandchild, MD  HUMALOG KWIKPEN 200 UNIT/ML KwikPen Inject 5 units under the skin three times daily with meals. 12/12/22   Etta Grandchild, MD  HYDROcodone-acetaminophen (NORCO) 5-325 MG tablet Take 1 tablet by mouth every 6 (six) hours as needed for moderate pain. 12/17/22   Etta Grandchild, MD  insulin glargine, 1 Unit Dial, (TOUJEO SOLOSTAR) 300 UNIT/ML Solostar Pen Inject 20-22 Units into the skin daily. 12/31/22   Carlus Pavlov, MD  levothyroxine (SYNTHROID) 88 MCG tablet Take 1 tablet (88 mcg total) by mouth daily. 05/03/22   Carlus Pavlov, MD  loperamide (IMODIUM) 2 MG capsule Take 1 capsule (2 mg total) by mouth 4 (four) times daily as needed for diarrhea or loose stools. 02/17/22   Horton, Mayer Masker, MD  loratadine (CLARITIN) 10 MG tablet Take 1 tablet (10 mg  total) by mouth daily. 04/21/21   Etta Grandchild, MD  lubiprostone (AMITIZA) 24 MCG capsule Take 1 capsule (24 mcg total) by mouth 2 (two) times daily with a meal. 08/31/22   Etta Grandchild, MD  Semaglutide, 2 MG/DOSE, 8 MG/3ML SOPN Inject 2 mg as directed  once a week. 05/03/22   Carlus Pavlov, MD  traZODone (DESYREL) 50 MG tablet Take 1 tablet (50 mg total) by mouth at bedtime as needed for sleep. 01/14/23   Cleotis Nipper, MD     Objective    Physical Exam: Vitals:   01/18/23 2130 01/18/23 2223 01/18/23 2300 01/19/23 0035  BP: 123/82  (!) 139/55 (!) 141/61  Pulse: (!) 109  (!) 107 (!) 109  Resp: 17  17 16   Temp:  98.7 F (37.1 C)  98.4 F (36.9 C)  TempSrc:  Oral  Oral  SpO2: 100%  100% 100%  Weight:      Height:        General: appears to be stated age; alert, oriented Skin: warm, dry, no rash Head:  AT/[place] Mouth:  Oral mucosa membranes appear dry, normal dentition Neck: supple; trachea midline Heart:  RRR; did not appreciate any M/R/G Lungs: CTAB, did not appreciate any wheezes, rales, or rhonchi Abdomen: + BS; soft, ND, NT Vascular: 2+ pedal pulses b/l; 2+ radial pulses b/l Extremities: no peripheral edema, no muscle wasting     Labs on Admission: I have personally reviewed following labs and imaging studies  CBC: Recent Labs  Lab 01/18/23 1845  WBC 12.9*  HGB 12.3  HCT 40.3  MCV 87.2  PLT 191   Basic Metabolic Panel: Recent Labs  Lab 01/18/23 1845 01/18/23 2216  NA 138  --   K 5.5*  --   CL 109  --   CO2 16*  --   GLUCOSE 169*  --   BUN 55*  --   CREATININE 2.50*  --   CALCIUM 9.5  --   MG  --  2.0   GFR: Estimated Creatinine Clearance: 15.9 mL/min (A) (by C-G formula based on SCr of 2.5 mg/dL (H)). Liver Function Tests: No results for input(s): "AST", "ALT", "ALKPHOS", "BILITOT", "PROT", "ALBUMIN" in the last 168 hours. No results for input(s): "LIPASE", "AMYLASE" in the last 168 hours. No results for input(s): "AMMONIA" in the last 168  hours. Coagulation Profile: No results for input(s): "INR", "PROTIME" in the last 168 hours. Cardiac Enzymes: No results for input(s): "CKTOTAL", "CKMB", "CKMBINDEX", "TROPONINI" in the last 168 hours. BNP (last 3 results) Recent Labs    03/29/22 1114  PROBNP 5.0   HbA1C: No results for input(s): "HGBA1C" in the last 72 hours. CBG: Recent Labs  Lab 01/18/23 1812  GLUCAP 156*   Lipid Profile: No results for input(s): "CHOL", "HDL", "LDLCALC", "TRIG", "CHOLHDL", "LDLDIRECT" in the last 72 hours. Thyroid Function Tests: No results for input(s): "TSH", "T4TOTAL", "FREET4", "T3FREE", "THYROIDAB" in the last 72 hours. Anemia Panel: No results for input(s): "VITAMINB12", "FOLATE", "FERRITIN", "TIBC", "IRON", "RETICCTPCT" in the last 72 hours. Urine analysis:    Component Value Date/Time   COLORURINE YELLOW 01/18/2023 2310   APPEARANCEUR CLEAR 01/18/2023 2310   LABSPEC >=1.030 01/18/2023 2310   PHURINE 5.5 01/18/2023 2310   GLUCOSEU 100 (A) 01/18/2023 2310   GLUCOSEU >=1000 (A) 03/29/2022 1114   HGBUR NEGATIVE 01/18/2023 2310   BILIRUBINUR NEGATIVE 01/18/2023 2310   KETONESUR NEGATIVE 01/18/2023 2310   PROTEINUR NEGATIVE 01/18/2023 2310   UROBILINOGEN 0.2 03/29/2022 1114   NITRITE NEGATIVE 01/18/2023 2310   LEUKOCYTESUR TRACE (A) 01/18/2023 2310    Radiological Exams on Admission: DG Chest Port 1 View  Result Date: 01/18/2023 CLINICAL DATA:  Weakness.  Patient fell and struck head. EXAM: PORTABLE CHEST 1 VIEW COMPARISON:  08/31/2022 FINDINGS: Normal heart size and pulmonary  vascularity. No focal airspace disease or consolidation in the lungs. No blunting of costophrenic angles. No pneumothorax. Mediastinal contours appear intact. Degenerative changes in the spine. Calcification of the aorta. IMPRESSION: No active disease. Electronically Signed   By: Burman Nieves M.D.   On: 01/18/2023 19:41   CT HEAD WO CONTRAST  Result Date: 01/18/2023 CLINICAL DATA:  Abrasion above the  right eye after fall on Monday with possible loss of consciousness EXAM: CT HEAD WITHOUT CONTRAST TECHNIQUE: Contiguous axial images were obtained from the base of the skull through the vertex without intravenous contrast. RADIATION DOSE REDUCTION: This exam was performed according to the departmental dose-optimization program which includes automated exposure control, adjustment of the mA and/or kV according to patient size and/or use of iterative reconstruction technique. COMPARISON:  CT head 02/17/2022 FINDINGS: Brain: No intracranial hemorrhage, mass effect, or evidence of acute infarct. No hydrocephalus. No extra-axial fluid collection. Generalized cerebral atrophy. Ill-defined hypoattenuation within the cerebral white matter is nonspecific but consistent with chronic small vessel ischemic disease. Chronic left parietal infarct. Vascular: No hyperdense vessel. Intracranial arterial calcification. Skull: No fracture or focal lesion. Small contusion about the right forehead. Sinuses/Orbits: No acute finding. Frothy secretions left sphenoid sinus. Paranasal sinuses and mastoid air cells are otherwise well aerated. Other: None. IMPRESSION: 1. No acute intracranial abnormality. 2. Small contusion about the right forehead. No calvarial fracture. Electronically Signed   By: Minerva Fester M.D.   On: 01/18/2023 18:51      Assessment/Plan   Principal Problem:   AKI (acute kidney injury) (HCC) Active Problems:   DM2 (diabetes mellitus, type 2) (HCC)   Acquired hypothyroidism   Depression   Syncope   SIRS (systemic inflammatory response syndrome) (HCC)   Generalized weakness   Hyperkalemia   Dehydration   Acute prerenal azotemia   Allergic rhinitis   Chronic diastolic CHF (congestive heart failure) (HCC)     #) Acute Kidney Injury:  as quantified above.  Appears prerenal in nature, consistent with laboratory finding of acute prerenal azotemia, as a consequence of dehydration stemming from recent  increase in GI losses in the form of nausea/vomiting as well as diminished oral intake as a consequence of decline in appetite following recent initiation of Ozempic.  Presenting urinalysis with microscopy showed no evidence of white blood cell casts nor any evidence of red blood cell casts, and was negative for protein.  There is the potential for pharmacologic exacerbation in the context of her outpatient ARB and chlorthalidone.  Plan: monitor strict I's & O's and daily weights. Attempt to avoid nephrotoxic agents hold home gabapentin, chlorthalidone, Azilsartan. Refrain from NSAIDs. Repeat CMP in the morning. Check serum magnesium level.  In the setting of outpatient use of high intensity atorvastatin, will also add on CPK level.  Normal saline at 100 cc/h. if renal function does not improve with the above measures, can consider obtaining a renal US to evaluate for parenchymal abnormality as well as to assess for evidence of post-renal obstructive process.             #) Hyperkalemia: Presenting serum potassium level mildly elevated at 5.5, which appears multifactorial in etiology, with contributions from presenting acute kidney injury, as above, as well as independent contribution from dehydration, as well as pharmacologic influences that include use of her angiotensin receptor blocker.  She is status post a dose of Lokelma at [facility].  Will continue with IV fluids and additional evaluation management of acute kidney injury, will plan  for additional temporizing measures, as needed, if no improvement in serum potassium level with these measures.  Plan: Continuous normal saline at 100 cc/h.  Monitor on symmetry.  Monitor strict I's and O's and daily weights.  Further evaluation management of AKI, as above.  Repeat CMP in the morning.  Hold home ARB.                 #) Dehydration: Clinical suspicion for such, including the appearance of dry oral mucous membranes as  well as laboratory findings notable for acute prerenal azotemia and UA demonstrating elevated specific gravity, and also evidenced by improving tachycardia with interval IV fluids, as further quantified above.  Appears to be in the setting of   increased GI losses in the form of recent nausea/vomiting as well as decline in oral intake, as outlined above.  No e/o associated hypotension.  It is possible that she may not be tolerating recently initiated Ozempic.   Plan: Monitor strict I's and O's.  Daily weights.  CMP in the morning. IVF's in form of normal saline at 100 cc/h.Marland Kitchen              #) Syncope: 1 episode of syncope that occurred when the patient was moving from a seated to standing position and associated with prodrome, suggestive of potential orthostatic hypotension in the setting of dehydration, as outlined above, and exacerbated by home pharmacologic factors serving to diminish compensatory peripheral vasoconstrictive response in the setting of outpatient ARB and Norvasc. Will check orthostatic vital signs, but with the caveat that the patient has already received IVF's in the ED, potentially altering the results of this evaluation.    Not associated with any overt acute focal neurologic deficits. Clinically, acute ischemic stroke versus seizures appear less likely at this time, including CT head that showed no evidence of acute intracranial process. Presentation appears less consistent with ACS at this time, with presenting EKG showing no evidence of acute ischemic changes, and the absence of any associated CP.    In setting of associated prodrome, the possibility of ventricular arrhythmia appears to be less likely at this time, although will closely monitor on telemetry for any e/o potential contributory arrhythmia, particularly in setting of her presenting mild hyperkalemia, while also assessing serum Mg level.    Plan: I have placed a nursing communication order requesting that  orthostatic vital signs x 1 set be checked and documented, following which will initiate gentle IVF's in the form of normal saline at 100 cc/h. Monitor on telemetry. Hold home ARB and chlorthalidone for now.  Monitor strict I's and O's.   Check CMP, CBC, serum Mg level in the AM. Fall precautions ordered.                #) Generalized weakness: 1 week duration of generalized weakness, in the absence of any evidence of acute focal neurologic deficits, including no evidence of acute focal weakness to suggest acute CVA.  Suspect including contributions from dehydration as well as from pharmacologic factors that include diminished renal clearance of several central acting medications in the setting of interval development of acute kidney injury.   No e/o additional infectious process at this time, including urinalysis that was inconsistent with UTI, chest x-ray shows no evidence of infiltrate..   Will further eval for any additional contributions from endocrine/metabolic sources, as detailed below.   Plan: work-up and management of presenting dehydration and acute kidney injury, as described above. PT/OT consults ordered for the AM.  Fall precautions. CMP/CBC in the AM. Check TSH, serum Mg level. Check CPK level in the context of AKI as well as use of high intensity atorvastatin as an outpatient.  IV fluids, as above.  Hold home gabapentin for now.  Check B12.               #) SIRS criteria present: Presentation associated with mild leukocytosis as well as mild tachycardia.  However, in the absence of e/o underlying infection at this time, including, urinalysis that is inconsistent UTI, and chest x-ray showing no evidence of acute cardiopulmonary process, criteria for sepsis not currently met.  suspect non-infectious factors contributing to these SIRS findings, including hemoconcentration as well as reactive influences contributing to leukocytosis in the setting of recent fall as well  as clinical evidence of dehydration. patient appears hemodynamically stable at this time.  This clinical evidence of dehydration likely also poses a contribution to the patient's mild tachycardia, which is improving with IV fluid resuscitative efforts, further substantiating this thought process.  Consequently, will refrain from initiation of IV antibiotics at this time.    Plan: Repeat CBC with diff in the AM.  Monitor strict I's and O's and daily weights.  Monitor on telemetry. Refraining from IV abx for now, as above.              #) Depression: On Wellbutrin and brexpiprazole as an outpatient.  Plan: cont Wellbutrin and brexpiprazole.               #) Type 2 Diabetes Mellitus: documented history of such. Home insulin regimen: Lantus 20 units SQ daily as well as 3 times daily NovoLog.  This is in addition to dapagliflozin as well as recently initiated Ozempic, the latter of which the patient may not be tolerating given her recurrent nausea/vomiting and presenting dehydration.  Most recent hemoglobin A1c noted to be 8.1% when checked on 12/31/2022.  Presenting glucose 169. her diabetes is complicated by diabetic peripheral polyneuropathy, for which she is on gabapentin at home.  In the setting of acute kidney injury, will hold home gabapentin for now.  in terms of initial dose of basal insulin to be started during this hospitalization, will resume approximately half of outpatient dose in order to reduce risk for ensuing hypoglycemia    Plan: accuchecks QAC and HS with low dose SSI. hold home oral hypoglycemic agents during this hospitalization.  Hold home gabapentin for now.  Lantus 8 units subcu every morning.  Hold home Ozempic for now.               #) acquired hypothyroidism: documented h/o such, on Synthroid as outpatient.   Plan: cont home Synthroid.  In the setting of her presenting generalized weakness, also add on TSH level.              #)  Allergic Rhinitis: documented h/o such, on scheduled Claritin in the absence of any intranasal corticosteroids as outpatient.    Plan: cont home Claritin.               #) Chronic diastolic heart failure: documented history of such, with most recent echocardiogram performed in April 2023, which is notable for LVEF greater than 75% as well as grade 1 diastolic dysfunction, with additional results as conveyed above. No clinical evidence to suggest acutely decompensated heart failure at this time, rather, there is clinical evidence to suggest dehydration at the time of this evening's presentation. home diuretic regimen reportedly consists of the  following: Chlorthalidone.   Plan: monitor strict I's & O's and daily weights. Repeat CMP in AM. Check serum mag level.  In the setting of presenting dehydration, will hold home chlorthalidone for now.      DVT prophylaxis: SCD's   Code Status: Full code Family Communication: none Disposition Plan: Per Rounding Team Consults called: none;  Admission status: inpatient     I SPENT GREATER THAN 75  MINUTES IN CLINICAL CARE TIME/MEDICAL DECISION-MAKING IN COMPLETING THIS ADMISSION.      Chaney Born Elmer Merwin DO [facility]  From 7PM - 7AM   01/19/2023, 1:21 AM

## 2023-01-19 NOTE — Progress Notes (Addendum)
Patient arrived OTF at 00:10am.  Patient has a dexcom monitor on the R inner arm.  Monitored sugar glucose at home via cell phone.  Last reading for blood sugar at 00:00am was 104.

## 2023-01-19 NOTE — Evaluation (Signed)
Physical Therapy Evaluation Patient Details Name: Sonia Skinner MRN: [id number] DOB: 22-Oct-1950 Today's Date: 01/19/2023  History of Present Illness  72 yo female presents to [facility] on 6/4 with fall, LOC. Workup for AKI, dehydration. PMH includes GERD, HTN, R MCA CVA 2015, ORIF ankle fx 2019, DM with neuropathy on ozempic, ACDF, CAD with history of stenting and endarterectomy, former smoker.  Clinical Impression   Pt presents with [facility] strength, balance, gait, and activity tolerance. Pt ambulated good hallway distance without AD, overall is mod I for increased time for mobility. BP stable throughout session, no reports of lightheadedness. Pt is at baseline, PT to sign off at this time.   BP, HR: -sitting: 120/47, 102 -standing: 138/59, 106 -standing 3 min: 144/60, 118         Recommendations for follow up therapy are one component of a multi-disciplinary discharge planning process, led by the attending physician.  Recommendations may be updated based on patient status, additional functional criteria and insurance authorization.  Follow Up Recommendations       Assistance Recommended at Discharge PRN  Patient can return home with the following       Equipment Recommendations None recommended by PT  Recommendations for Other Services       Functional Status Assessment Patient has not had a recent decline in their functional status     Precautions / Restrictions Precautions Precautions: Other (comment) Precaution Comments: falls due to suspected hypotension, dehydration Restrictions Weight Bearing Restrictions: No      Mobility  Bed Mobility               General bed mobility comments: up in chair    Transfers Overall transfer level: Modified independent Equipment used: None               General transfer comment: no physical assist    Ambulation/Gait Ambulation/Gait assistance: Modified independent (Device/Increase time) Gait Distance (Feet): 250  Feet Assistive device: None Gait Pattern/deviations: Step-through pattern, Decreased stride length Gait velocity: decr     General Gait Details: slowed, but no physical assist or unsteadiness noted  Stairs            Wheelchair Mobility    Modified Rankin (Stroke Patients Only)       Balance Overall balance assessment: Needs assistance, History of Falls Sitting-balance support: No upper extremity supported, Feet supported Sitting balance-Leahy Scale: Good     Standing balance support: No upper extremity supported, During functional activity Standing balance-Leahy Scale: Good                 High Level Balance Comments: tolerates head turns, step over object, direction changes well             Pertinent Vitals/Pain Pain Assessment Pain Assessment: No/denies pain    Home Living Family/patient expects to be discharged to:: Private residence Living Arrangements: Other relatives Available Help at Discharge: Family;Available 24 hours/day Type of Home: House Home Access: Level entry       Home Layout: One level Home Equipment: None      Prior Function Prior Level of Function : Needs assist             Mobility Comments: 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, unsure if active with this ADLs Comments: pt reports sister does cleaning and most of the cooking, can do basic cooking and cleaning tasks     Hand Dominance   Dominant Hand: Left    Extremity/Trunk Assessment  Upper Extremity Assessment Upper Extremity Assessment: Defer to OT evaluation    Lower Extremity Assessment Lower Extremity Assessment: Overall WFL for tasks assessed    Cervical / Trunk Assessment Cervical / Trunk Assessment: Normal  Communication   Communication: No difficulties  Cognition Arousal/Alertness: Awake/alert Behavior During Therapy: WFL for tasks assessed/performed Overall Cognitive Status: Within Functional Limits for tasks assessed                                           General Comments      Exercises     Assessment/Plan    PT Assessment Patient needs continued PT services;Patient does not need any further PT services  PT Problem List         PT Treatment Interventions  (n/a)    PT Goals (Current goals can be found in the Care Plan section)  Acute Rehab PT Goals Patient Stated Goal: home PT Goal Formulation: All assessment and education complete, DC therapy Time For Goal Achievement: 01/19/23 Potential to Achieve Goals: Good    Frequency  (n/a)     Co-evaluation               AM-PAC PT "6 Clicks" Mobility  Outcome Measure Help needed turning from your back to your side while in a flat bed without using bedrails?: None Help needed moving from lying on your back to sitting on the side of a flat bed without using bedrails?: None Help needed moving to and from a bed to a chair (including a wheelchair)?: None Help needed standing up from a chair using your arms (e.g., wheelchair or bedside chair)?: None Help needed to walk in hospital room?: None Help needed climbing 3-5 steps with a railing? : A Little 6 Click Score: 23    End of Session   Activity Tolerance: Patient tolerated treatment well Patient left: in chair;with call bell/phone within reach;Other (comment) (up in chair upon PT arrival, no chair alarm present in room and pt expresses she calls for help when mobilizing) Nurse Communication: Mobility status PT Visit Diagnosis: Other abnormalities of gait and mobility (R26.89);Difficulty in walking, not elsewhere classified (R26.2)    Time: 1610-9604 PT Time Calculation (min) (ACUTE ONLY): 17 min   Charges:   PT Evaluation $PT Eval Low Complexity: 1 Low          Kadeisha Betsch S, PT DPT Acute Rehabilitation Services Secure Chat Preferred  Office [phone]   Kathaleen Dudziak Sheliah Plane 01/19/2023, 12:27 PM

## 2023-01-19 NOTE — TOC Initial Note (Signed)
Transition of Care [facility]) - Initial/Assessment Note    Patient Details  Name: Sonia Skinner MRN: [id number] Date of Birth: 1951/06/08  Transition of Care [facility] CM/SW Contact:    Janae Bridgeman, RN Phone Number: 01/19/2023, 2:37 PM  Clinical Narrative:                 CM met with the patient and sister, Britta Mccreedy at the bedside to discuss TOC needs.  The patient admitted to the hospital for renal failure and syncopal episode at home.  The patient lives with her sister, Jamesetta So at the home.  THe patient currently drives and has a DexCom diabetes management system.  I spoke with the patient regarding Outpatient OT needs and the patient was agreeable to referral to [facility] OUtpatient therapy -- referral placed electronically and is noted in the AVS for follow up.  No other TOC needs at this time.  Expected Discharge Plan: OP Rehab Barriers to Discharge: Continued Medical Work up   Patient Goals and CMS Choice Patient states their goals for this hospitalization and ongoing recovery are:: To return home CMS Medicare.gov Compare Post Acute Care list provided to:: Patient Choice offered to / list presented to : Patient [place] ownership interest in [facility].provided to:: Patient    Expected Discharge Plan and Services   Discharge Planning Services: CM Consult Post Acute Care Choice: Resumption of Svcs/PTA Provider Living arrangements for the past 2 months: Apartment                                      Prior Living Arrangements/Services Living arrangements for the past 2 months: Apartment Lives with:: Siblings (Lives with sister, Vesta Mixer) Patient language and need for interpreter reviewed:: Yes Do you feel safe going back to the place where you live?: Yes      Need for Family Participation in Patient Care: Yes (Comment) Care giver support system in place?: Yes (comment) Current home services: DME (Dexcom diabetes management) Criminal Activity/Legal  Involvement Pertinent to Current Situation/Hospitalization: No - Comment as needed  Activities of Daily Living Home Assistive Devices/Equipment: Hearing aid ADL Screening (condition at time of admission) Patient's cognitive ability adequate to safely complete daily activities?: Yes Is the patient deaf or have difficulty hearing?: No Does the patient have difficulty seeing, even when wearing glasses/contacts?: No Does the patient have difficulty concentrating, remembering, or making decisions?: No Patient able to express need for assistance with ADLs?: No Does the patient have difficulty dressing or bathing?: No Independently performs ADLs?: Yes (appropriate for developmental age) Does the patient have difficulty walking or climbing stairs?: No Weakness of Legs: Both Weakness of Arms/Hands: Both  Permission Sought/Granted Permission sought to share information with : Case Manager, Family Supports, Photographer granted to share info w AGENCY: Outpatient referral placed for OT at [facility] OUtpatient OT  Permission granted to share info w Relationship: Vesta Mixer, sister at bedside     Emotional Assessment Appearance:: Appears stated age Attitude/Demeanor/Rapport: Gracious Affect (typically observed): Accepting Orientation: : Oriented to Self, Oriented to Place, Oriented to  Time, Oriented to Situation Alcohol / Substance Use: Not Applicable Psych Involvement: No (comment)  Admission diagnosis:  Hyperkalemia [E87.5] AKI (acute kidney injury) (HCC) [N17.9] Injury of head, initial encounter [S09.90XA] Syncope, unspecified syncope type [R55] Patient Active Problem List   Diagnosis Date Noted   Hyperkalemia  01/19/2023   Dehydration 01/19/2023   Acute prerenal azotemia 01/19/2023   Allergic rhinitis 01/19/2023   Chronic diastolic CHF (congestive heart failure) (HCC) 01/19/2023   AKI (acute kidney injury) (HCC) 01/18/2023   Chronic idiopathic  constipation 08/31/2022   Chronic renal disease, stage 4, severely decreased glomerular filtration rate (GFR) between 15-29 mL/min/1.73 square meter (HCC) 06/08/2022   Chronic midline low back pain with bilateral sciatica 06/01/2022   Generalized weakness 03/29/2022   Multinodular goiter 11/02/2021   Encounter for general adult medical examination with abnormal findings 04/08/2021   Visit for screening mammogram 04/01/2020   Type 2 diabetes mellitus with complication, with long-term current use of insulin (HCC) 09/18/2018   Age-related osteoporosis with current pathological fracture 02/20/2018   Syncope 12/11/2016   SIRS (systemic inflammatory response syndrome) (HCC) 12/11/2016   Intractable migraine without aura and with status migrainosus 11/24/2016   GERD (gastroesophageal reflux disease) 03/08/2016   Major depressive disorder, recurrent episode (HCC) 02/20/2016   Occlusion and stenosis of carotid artery with cerebral infarction 11/25/2013   Depression 11/25/2013   Cerebrovascular accident (CVA) due to stenosis of left middle cerebral artery (HCC) 11/02/2013   Vitamin D deficiency 10/19/2013   Tobacco abuse 07/31/2013   Essential hypertension, benign 04/20/2013   DM2 (diabetes mellitus, type 2) (HCC) 04/20/2013   Acquired hypothyroidism 04/20/2013   Hyperlipidemia with target LDL less than 70 04/20/2013   PCP:  Etta Grandchild, MD Pharmacy:   [facility] [address] [place] - [address] [place] [address] Phone: [phone] Fax: [phone]  [facility] - [place], [place] - [address] [place] [address] Phone: [phone] Fax: [phone]     Social Determinants of Health (SDOH) Social History: SDOH Screenings   Food Insecurity: No Food Insecurity (01/19/2023)  Housing: Patient Declined (01/19/2023)  Transportation Needs: No Transportation Needs (01/19/2023)   Utilities: Not At Risk (01/19/2023)  Alcohol Screen: Low Risk  (01/28/2022)  Depression (PHQ2-9): High Risk (03/29/2022)  Financial Resource Strain: Low Risk  (01/28/2022)  Physical Activity: Insufficiently Active (01/28/2022)  Social Connections: Moderately Integrated (01/28/2022)  Stress: No Stress Concern Present (01/28/2022)  Tobacco Use: Medium Risk (01/19/2023)   SDOH Interventions:     Readmission Risk Interventions    01/19/2023    2:37 PM  Readmission Risk Prevention Plan  Transportation Screening Complete  PCP or Specialist Appt within 3-5 Days Complete  HRI or Home Care Consult Complete  Social Work Consult for Recovery Care Planning/Counseling Complete  Palliative Care Screening Complete  Medication Review Oceanographer) Complete

## 2023-01-20 DIAGNOSIS — N179 Acute kidney failure, unspecified: Secondary | ICD-10-CM

## 2023-01-20 LAB — CBC
HCT: 29.4 % — ABNORMAL LOW (ref 36.0–46.0)
Hemoglobin: 9.1 g/dL — ABNORMAL LOW (ref 12.0–15.0)
MCH: 26.5 pg (ref 26.0–34.0)
MCHC: 31 g/dL (ref 30.0–36.0)
MCV: 85.5 fL (ref 80.0–100.0)
Platelets: 162 10*3/uL (ref 150–400)
RBC: 3.44 MIL/uL — ABNORMAL LOW (ref 3.87–5.11)
RDW: 15 % (ref 11.5–15.5)
WBC: 8.5 10*3/uL (ref 4.0–10.5)
nRBC: 0 % (ref 0.0–0.2)

## 2023-01-20 LAB — BASIC METABOLIC PANEL
Anion gap: 7 (ref 5–15)
BUN: 33 mg/dL — ABNORMAL HIGH (ref 8–23)
CO2: 20 mmol/L — ABNORMAL LOW (ref 22–32)
Calcium: 8.8 mg/dL — ABNORMAL LOW (ref 8.9–10.3)
Chloride: 112 mmol/L — ABNORMAL HIGH (ref 98–111)
Creatinine, Ser: 1.53 mg/dL — ABNORMAL HIGH (ref 0.44–1.00)
GFR, Estimated: 36 mL/min — ABNORMAL LOW (ref >60)
Glucose, Bld: 110 mg/dL — ABNORMAL HIGH (ref 70–99)
Potassium: 3.6 mmol/L (ref 3.5–5.1)
Sodium: 139 mmol/L (ref 135–145)

## 2023-01-20 LAB — GLUCOSE, CAPILLARY
Glucose-Capillary: 136 mg/dL — ABNORMAL HIGH (ref 70–99)
Glucose-Capillary: 141 mg/dL — ABNORMAL HIGH (ref 70–99)
Glucose-Capillary: 228 mg/dL — ABNORMAL HIGH (ref 70–99)
Glucose-Capillary: 204 mg/dL — ABNORMAL HIGH (ref 70–99)

## 2023-01-20 LAB — MAGNESIUM: Magnesium: 1.7 mg/dL (ref 1.7–2.4)

## 2023-01-20 MED ORDER — ENSURE ENLIVE PO LIQD
237.0000 mL | Freq: Three times a day (TID) | ORAL | Status: DC
  Administered 2023-01-21: 237 mL via ORAL

## 2023-01-20 MED ORDER — MAGNESIUM OXIDE -MG SUPPLEMENT 400 (240 MG) MG PO TABS
800.0000 mg | ORAL_TABLET | Freq: Once | ORAL | Status: AC
  Administered 2023-01-20: 800 mg via ORAL
  Filled 2023-01-20: qty 2

## 2023-01-20 MED ORDER — POTASSIUM CHLORIDE CRYS ER 20 MEQ PO TBCR
40.0000 meq | EXTENDED_RELEASE_TABLET | Freq: Once | ORAL | Status: AC
  Administered 2023-01-20: 40 meq via ORAL
  Filled 2023-01-20: qty 2

## 2023-01-20 MED ORDER — RENA-VITE PO TABS
1.0000 | ORAL_TABLET | Freq: Every day | ORAL | Status: DC
  Administered 2023-01-20: 1 via ORAL
  Filled 2023-01-20: qty 1

## 2023-01-20 MED ORDER — SODIUM CHLORIDE 0.45 % IV SOLN: INTRAVENOUS | Status: AC

## 2023-01-20 NOTE — Progress Notes (Signed)
Initial Nutrition Assessment  DOCUMENTATION CODES:   Non-severe (moderate) malnutrition in context of chronic illness  INTERVENTION:  - Add Ensure Enlive po TID, each supplement provides 350 kcal and 20 grams of protein.  - Add Rena-vit q day.   NUTRITION DIAGNOSIS:   Moderate Malnutrition related to chronic illness as evidenced by moderate muscle depletion, mild fat depletion, energy intake < 75% for > or equal to 1 month.  GOAL:   Patient will meet greater than or equal to 90% of their needs  MONITOR:   PO intake, Supplement acceptance  REASON FOR ASSESSMENT:   Malnutrition Screening Tool    ASSESSMENT:   72 y.o. female admits related to syncope. PMH includes: T2DM, acute ischemic stroke, CHF. Pt is currently receiving medical management related to AKI.  Meds reviewed: lipitor, sliding scale insulin, semglee. Labs reviewed: BUN/Creatinine elevated.   The pt reports that she has not been eating well since admission and has a very poor appetite. She states that this has been ongoing for the past 2 months. No significant wt loss per record. Pt states that she would be open to protein shakes. Pt currently has Boost Breeze ordered but pt is open to trying regular shakes which will provide more calories. RD will continue to monitor PO intakes.   NUTRITION - FOCUSED PHYSICAL EXAM:  Flowsheet Row Most Recent Value  Orbital Region Mild depletion  Upper Arm Region Mild depletion  Thoracic and Lumbar Region Unable to assess  Buccal Region Mild depletion  Temple Region Moderate depletion  Clavicle Bone Region Moderate depletion  Scapular Bone Region Moderate depletion  Dorsal Hand Mild depletion  Patellar Region Unable to assess  Anterior Thigh Region Unable to assess  Posterior Calf Region Unable to assess  Edema (RD Assessment) None  Hair Reviewed  Eyes Reviewed  Mouth Reviewed  Skin Reviewed  Nails Reviewed       Diet Order:   Diet Order             Diet  regular Room service appropriate? Yes; Fluid consistency: Thin  Diet effective now                   EDUCATION NEEDS:   Not appropriate for education at this time  Skin:  Skin Assessment: Reviewed RN Assessment  Last BM:  01/19/23  Height:   Ht Readings from Last 1 Encounters:  01/18/23 5\' 2"  (1.575 m)    Weight:   Wt Readings from Last 1 Encounters:  01/18/23 56 kg    Ideal Body Weight:     BMI:  Body mass index is 22.58 kg/m.  Estimated Nutritional Needs:   Kcal:  1400-1680 kcals  Protein:  70-85 gm  Fluid:  >/= 1.4 L  Bethann Humble, RD, LDN, CNSC.

## 2023-01-20 NOTE — Plan of Care (Signed)

## 2023-01-20 NOTE — Progress Notes (Signed)
PROGRESS NOTE    Sonia Skinner  ZOX:[id number] DOB: [birth year] DOA: 01/18/2023 PCP: Etta Grandchild, MD   Brief Narrative:  72 year old with history of DM2, ischemic CVA 2015 with left-sided hemiparesis, chronic diastolic CHF admitted to [facility] from 6/4 due to AKI and hyperkalemia.  She was recently started on Ozempic and since then due to nausea vomiting she has had poor oral intake and symptoms of presyncope.  Upon admission noted to have AKI and hyperkalemia.   Assessment & Plan:  Principal Problem:   AKI (acute kidney injury) (HCC) Active Problems:   DM2 (diabetes mellitus, type 2) (HCC)   Acquired hypothyroidism   Depression   Syncope   SIRS (systemic inflammatory response syndrome) (HCC)   Generalized weakness   Hyperkalemia   Dehydration   Acute prerenal azotemia   Allergic rhinitis   Chronic diastolic CHF (congestive heart failure) (HCC)    Acute kidney injury Moderate to severe dehydration, presyncope - Baseline creatinine 1.3, admission creatinine 2.5.  Renal functions improving but still still is feeling dizzy.  Continue IV fluids.  Creatinine 1.53 -Monitor electrolytes.  Generalized weakness - PT/OT no follow-up necessary.  Depression - Continue outpatient medications-Wellbutrin and Cogentin  Insulin-dependent diabetes mellitus type 2 - Most recent A1c 8.1.  Currently on sliding scale and Accu-Chek.  Lantus 8 units daily. - Home Ozempic on hold  Hypothyroidism - Synthroid  Essential hypertension - Norvasc.  IV as needed.  Home nephrotoxic meds on hold.  Chronic diastolic CHF, preserved EF 75% on echo in 2023 - Currently appears to be dehydrated, getting gentle fluids.    PT/OT   DVT prophylaxis: SCDs Code Status: Full code Family Communication:   Status is: Inpatient Still quite dizzy, continue hospital stay for IV fluids      Diet Orders (From admission, onward)     Start     Ordered   01/19/23 0348  Diet regular Room service  appropriate? Yes; Fluid consistency: Thin  Diet effective now       Question Answer Comment  Room service appropriate? Yes   Fluid consistency: Thin      01/19/23 0347            Subjective:  Still feels dizzy every time she tries to get up.  No other complaints  Examination: Constitutional: Not in acute distress Respiratory: Clear to auscultation bilaterally Cardiovascular: Normal sinus rhythm, no rubs Abdomen: Nontender nondistended good bowel sounds Musculoskeletal: No edema noted Skin: No rashes seen Neurologic: CN 2-12 grossly intact.  And nonfocal Psychiatric: Normal judgment and insight. Alert and oriented x 3. Normal mood.    Objective: Vitals:   01/19/23 1653 01/19/23 2135 01/20/23 0545 01/20/23 0755  BP: (!) 122/40 (!) 124/42 (!) 118/56 (!) 116/50  Pulse: (!) 103 87 89 84  Resp: 15 16 16 18   Temp: 98.8 F (37.1 C) 98.3 F (36.8 C) 98.1 F (36.7 C) 97.7 F (36.5 C)  TempSrc: Oral Oral Oral Oral  SpO2: 100% 100% 99% 100%  Weight:      Height:       No intake or output data in the 24 hours ending 01/20/23 1129  Filed Weights   01/18/23 1811  Weight: 56 kg    Scheduled Meds:  amLODipine  5 mg Oral Daily   aspirin  81 mg Oral Daily   atorvastatin  40 mg Oral Daily   benztropine  0.5 mg Oral QHS   brexpiprazole  1 mg Oral Daily   buPROPion  300  mg Oral Daily   ezetimibe  10 mg Oral Daily   feeding supplement  1 Container Oral TID BM   insulin aspart  0-9 Units Subcutaneous TID AC & HS   insulin glargine-yfgn  8 Units Subcutaneous q morning   levothyroxine  88 mcg Oral QAC breakfast   loratadine  10 mg Oral Daily   neomycin-bacitracin-polymyxin   Topical TID   pantoprazole  40 mg Oral Daily   Continuous Infusions:  sodium chloride      Nutritional status     Body mass index is 22.58 kg/m.  Data Reviewed:   CBC: Recent Labs  Lab 01/18/23 1845 01/19/23 0759 01/20/23 0737  WBC 12.9* 8.8 8.5  NEUTROABS  --  3.9  --   HGB 12.3 9.5*  9.1*  HCT 40.3 30.8* 29.4*  MCV 87.2 86.5 85.5  PLT 191 182 162   Basic Metabolic Panel: Recent Labs  Lab 01/18/23 1845 01/18/23 2216 01/19/23 0759 01/20/23 0737  NA 138  --  138 139  K 5.5*  --  3.7 3.6  CL 109  --  106 112*  CO2 16*  --  17* 20*  GLUCOSE 169*  --  85 110*  BUN 55*  --  49* 33*  CREATININE 2.50*  --  2.04* 1.53*  CALCIUM 9.5  --  9.0 8.8*  MG  --  2.0 1.8 1.7  PHOS  --   --  3.9  --    GFR: Estimated Creatinine Clearance: 25.9 mL/min (A) (by C-G formula based on SCr of 1.53 mg/dL (H)). Liver Function Tests: No results for input(s): "AST", "ALT", "ALKPHOS", "BILITOT", "PROT", "ALBUMIN" in the last 168 hours. No results for input(s): "LIPASE", "AMYLASE" in the last 168 hours. No results for input(s): "AMMONIA" in the last 168 hours. Coagulation Profile: No results for input(s): "INR", "PROTIME" in the last 168 hours. Cardiac Enzymes: Recent Labs  Lab 01/19/23 0759  CKTOTAL 41   BNP (last 3 results) Recent Labs    03/29/22 1114  PROBNP 5.0   HbA1C: No results for input(s): "HGBA1C" in the last 72 hours. CBG: Recent Labs  Lab 01/19/23 0925 01/19/23 1146 01/19/23 1837 01/19/23 2037 01/20/23 0904  GLUCAP 109* 172* 219* 204* 136*   Lipid Profile: No results for input(s): "CHOL", "HDL", "LDLCALC", "TRIG", "CHOLHDL", "LDLDIRECT" in the last 72 hours. Thyroid Function Tests: Recent Labs    01/19/23 0759  TSH 0.368   Anemia Panel: Recent Labs    01/19/23 0759  VITAMINB12 982*   Sepsis Labs: No results for input(s): "PROCALCITON", "LATICACIDVEN" in the last 168 hours.  No results found for this or any previous visit (from the past 240 hour(s)).       Radiology Studies: DG Chest Port 1 View  Result Date: 01/18/2023 CLINICAL DATA:  Weakness.  Patient fell and struck head. EXAM: PORTABLE CHEST 1 VIEW COMPARISON:  08/31/2022 FINDINGS: Normal heart size and pulmonary vascularity. No focal airspace disease or consolidation in the lungs.  No blunting of costophrenic angles. No pneumothorax. Mediastinal contours appear intact. Degenerative changes in the spine. Calcification of the aorta. IMPRESSION: No active disease. Electronically Signed   By: Burman Nieves M.D.   On: 01/18/2023 19:41   CT HEAD WO CONTRAST  Result Date: 01/18/2023 CLINICAL DATA:  Abrasion above the right eye after fall on Monday with possible loss of consciousness EXAM: CT HEAD WITHOUT CONTRAST TECHNIQUE: Contiguous axial images were obtained from the base of the skull through the vertex without intravenous contrast.  RADIATION DOSE REDUCTION: This exam was performed according to the departmental dose-optimization program which includes automated exposure control, adjustment of the mA and/or kV according to patient size and/or use of iterative reconstruction technique. COMPARISON:  CT head 02/17/2022 FINDINGS: Brain: No intracranial hemorrhage, mass effect, or evidence of acute infarct. No hydrocephalus. No extra-axial fluid collection. Generalized cerebral atrophy. Ill-defined hypoattenuation within the cerebral white matter is nonspecific but consistent with chronic small vessel ischemic disease. Chronic left parietal infarct. Vascular: No hyperdense vessel. Intracranial arterial calcification. Skull: No fracture or focal lesion. Small contusion about the right forehead. Sinuses/Orbits: No acute finding. Frothy secretions left sphenoid sinus. Paranasal sinuses and mastoid air cells are otherwise well aerated. Other: None. IMPRESSION: 1. No acute intracranial abnormality. 2. Small contusion about the right forehead. No calvarial fracture. Electronically Signed   By: Minerva Fester M.D.   On: 01/18/2023 18:51           LOS: 1 day   Time spent= 35 mins    Alanzo Lamb Joline Maxcy, MD [facility]  If 7PM-7AM, please contact night-coverage  01/20/2023, 11:29 AM

## 2023-01-21 DIAGNOSIS — N179 Acute kidney failure, unspecified: Secondary | ICD-10-CM | POA: Diagnosis not present

## 2023-01-21 DIAGNOSIS — E440 Moderate protein-calorie malnutrition: Secondary | ICD-10-CM | POA: Insufficient documentation

## 2023-01-21 LAB — GLUCOSE, CAPILLARY: Glucose-Capillary: 92 mg/dL (ref 70–99)

## 2023-01-21 LAB — CBC
HCT: 31 % — ABNORMAL LOW (ref 36.0–46.0)
Hemoglobin: 9.4 g/dL — ABNORMAL LOW (ref 12.0–15.0)
MCH: 26.3 pg (ref 26.0–34.0)
MCHC: 30.3 g/dL (ref 30.0–36.0)
MCV: 86.6 fL (ref 80.0–100.0)
Platelets: 169 10*3/uL (ref 150–400)
RBC: 3.58 MIL/uL — ABNORMAL LOW (ref 3.87–5.11)
RDW: 14.9 % (ref 11.5–15.5)
WBC: 7.8 10*3/uL (ref 4.0–10.5)
nRBC: 0 % (ref 0.0–0.2)

## 2023-01-21 LAB — BASIC METABOLIC PANEL
Anion gap: 8 (ref 5–15)
BUN: 16 mg/dL (ref 8–23)
CO2: 20 mmol/L — ABNORMAL LOW (ref 22–32)
Calcium: 9.2 mg/dL (ref 8.9–10.3)
Chloride: 112 mmol/L — ABNORMAL HIGH (ref 98–111)
Creatinine, Ser: 1.38 mg/dL — ABNORMAL HIGH (ref 0.44–1.00)
GFR, Estimated: 40 mL/min — ABNORMAL LOW (ref >60)
Glucose, Bld: 173 mg/dL — ABNORMAL HIGH (ref 70–99)
Potassium: 3.8 mmol/L (ref 3.5–5.1)
Sodium: 140 mmol/L (ref 135–145)

## 2023-01-21 LAB — MAGNESIUM: Magnesium: 1.5 mg/dL — ABNORMAL LOW (ref 1.7–2.4)

## 2023-01-21 NOTE — Discharge Summary (Signed)
Physician Discharge Summary  Sonia Skinner ZOX:[id number] DOB: 28-Nov-1950 DOA: 01/18/2023  PCP: Etta Grandchild, MD  Admit date: 01/18/2023 Discharge date: 01/21/2023  Admitted From: Home Disposition:  Home  Recommendations for Outpatient Follow-up:  Follow up with PCP in 1-2 weeks Please obtain BMP/CBC in one week your next doctors visit.  Discontinue Ozempic and Farxiga for now.  Marcelline Deist can be resumed by outpatient nephrology if deemed appropriate.   Discharge Condition: Stable CODE STATUS: Full code Diet recommendation: Diabetic  Brief/Interim Summary: 72 year old with history of DM2, ischemic CVA 2015 with left-sided hemiparesis, chronic diastolic CHF admitted to [facility] from 6/4 due to AKI and hyperkalemia.  She was recently started on Ozempic and since then due to nausea vomiting she has had poor oral intake and symptoms of presyncope.  Upon admission noted to have AKI and hyperkalemia.  Her GI symptoms and dehydration likely secondary to Ozempic and [place] which has been discontinued.  IV fluids resolved her acute kidney injury and hyperkalemia.    Patient is medically stable for discharge today.     Assessment & Plan:  Principal Problem:   AKI (acute kidney injury) (HCC) Active Problems:   DM2 (diabetes mellitus, type 2) (HCC)   Acquired hypothyroidism   Depression   Syncope   SIRS (systemic inflammatory response syndrome) (HCC)   Generalized weakness   Hyperkalemia   Dehydration   Acute prerenal azotemia   Allergic rhinitis   Chronic diastolic CHF (congestive heart failure) (HCC)     Acute kidney injury Moderate to severe dehydration, presyncope - Baseline creatinine 1.3, admission creatinine 2.5.  Resolved now with IV fluids creatinine 1.53 > 1.38.  Medication changes as mentioned above -Monitor electrolytes.   Generalized weakness - PT/OT no follow-up necessary.   Depression - Continue outpatient medications-Wellbutrin and Cogentin   Insulin-dependent  diabetes mellitus type 2 - Most recent A1c 8.1.  Currently on sliding scale and Accu-Chek.  Discontinuing Ozempic and Farxiga upon discharge.  Can resume rest of the home medication.  Follow-up outpatient nephrology and endocrinology   Hypothyroidism - Synthroid   Essential hypertension - Norvasc.   Chronic diastolic CHF, preserved EF 75% on echo in 2023 - Stable   PT/OT-no follow-up necessary    Discharge Diagnoses:  Principal Problem:   AKI (acute kidney injury) (HCC) Active Problems:   DM2 (diabetes mellitus, type 2) (HCC)   Acquired hypothyroidism   Depression   Syncope   SIRS (systemic inflammatory response syndrome) (HCC)   Generalized weakness   Hyperkalemia   Dehydration   Acute prerenal azotemia   Allergic rhinitis   Chronic diastolic CHF (congestive heart failure) (HCC)   Malnutrition of moderate degree      Consultations: None  Subjective: Patient feels well, does not have any complaints.  Discharge Exam: Vitals:   01/21/23 0507 01/21/23 0803  BP: (!) 133/47 (!) 126/42  Pulse: 84 80  Resp: 18 18  Temp: 98.4 F (36.9 C) 98.7 F (37.1 C)  SpO2: 99% 100%   Vitals:   01/20/23 1619 01/20/23 2045 01/21/23 0507 01/21/23 0803  BP: (!) 117/46 (!) 137/47 (!) 133/47 (!) 126/42  Pulse: 94 90 84 80  Resp: 18 18 18 18   Temp: 98.3 F (36.8 C) 98.4 F (36.9 C) 98.4 F (36.9 C) 98.7 F (37.1 C)  TempSrc:  Oral  Oral  SpO2: 100% 100% 99% 100%  Weight:      Height:        General: Pt is alert, awake, not in  acute distress Cardiovascular: RRR, S1/S2 +, no rubs, no gallops Respiratory: CTA bilaterally, no wheezing, no rhonchi Abdominal: Soft, NT, ND, bowel sounds + Extremities: no edema, no cyanosis  Discharge Instructions  Discharge Instructions     Ambulatory referral to Physical Therapy   Complete by: As directed    Needs Outpatient OT for Medication management assistance along with assistance with ADL's   Iontophoresis - 4 mg/ml of  dexamethasone: No   T.E.N.S. Unit Evaluation and Dispense as Indicated: No      Allergies as of 01/21/2023       Reactions   Anesthetics, Ester Anaphylaxis        Medication List     STOP taking these medications    dapagliflozin propanediol 10 MG Tabs tablet Commonly known as: Farxiga   Semaglutide (2 MG/DOSE) 8 MG/3ML Sopn       TAKE these medications    Accu-Chek FastClix Lancets Misc Use to check blood sugar five times daily.   Accu-Chek Guide test strip Generic drug: glucose blood Use to check blood sugar twice a day. E11.9   amLODipine 10 MG tablet Commonly known as: NORVASC Take 1 tablet (10 mg total) by mouth daily. What changed: how much to take   aspirin 81 MG chewable tablet Chew 1 tablet (81 mg total) by mouth daily.   atorvastatin 40 MG tablet Commonly known as: LIPITOR Take 1 tablet by mouth daily.   benztropine 0.5 MG tablet Commonly known as: COGENTIN Take 1 tablet (0.5 mg total) by mouth at bedtime.   brexpiprazole 1 MG Tabs tablet Commonly known as: Rexulti Take 1 tablet (1 mg total) by mouth daily.   buPROPion 300 MG 24 hr tablet Commonly known as: WELLBUTRIN XL Take 1 tablet (300 mg total) by mouth daily.   Dexcom G7 Receiver Devi 1 Act by Does not apply route daily.   Dexcom G7 Sensor Misc 1 Act by Does not apply route daily.   Dexcom G7 Sensor Misc 1 Act by Does not apply route daily.   Dexcom G7 Sensor Misc USE AS DIRECTED TOPICALLY  EVERY  10  DAYS   dexlansoprazole 60 MG capsule Commonly known as: DEXILANT Take 1 capsule by mouth daily.   Edarbyclor 40-12.5 MG Tabs Generic drug: Azilsartan-Chlorthalidone Take 1 tablet by mouth daily.   ezetimibe 10 MG tablet Commonly known as: ZETIA Take 1 tablet by mouth once daily.   gabapentin 100 MG capsule Commonly known as: NEURONTIN Take 1 capsule (100 mg total) by mouth 3 (three) times daily.   HumaLOG KwikPen 200 UNIT/ML KwikPen Generic drug: insulin lispro Inject  5 units under the skin three times daily with meals.   HYDROcodone-acetaminophen 5-325 MG tablet Commonly known as: Norco Take 1 tablet by mouth every 6 (six) hours as needed for moderate pain.   levothyroxine 88 MCG tablet Commonly known as: SYNTHROID Take 1 tablet (88 mcg total) by mouth daily.   loperamide 2 MG capsule Commonly known as: IMODIUM Take 1 capsule (2 mg total) by mouth 4 (four) times daily as needed for diarrhea or loose stools.   loratadine 10 MG tablet Commonly known as: CLARITIN Take 1 tablet (10 mg total) by mouth daily.   lubiprostone 24 MCG capsule Commonly known as: AMITIZA Take 1 capsule (24 mcg total) by mouth 2 (two) times daily with a meal.   Toujeo SoloStar 300 UNIT/ML Solostar Pen Generic drug: insulin glargine (1 Unit Dial) Inject 20-22 Units into the skin daily.   traZODone 50 MG  tablet Commonly known as: DESYREL Take 1 tablet (50 mg total) by mouth at bedtime as needed for sleep.   VITAMIN D3 PO Take 1 tablet by mouth daily. Unsure of dose        Follow-up Information     [facility]. Call.   Specialty: Rehabilitation Why: Call the clinic to follow up regarding OUtpatient therapy for Medication management assistance. Contact information: [address] [id number] m[address] [place] [address] [phone]        Etta Grandchild, MD Follow up in 1 week(s).   Specialty: Internal Medicine Contact information: [address] [place] [address] [phone]                Allergies  Allergen Reactions   Anesthetics, Ester Anaphylaxis    You were cared for by a hospitalist during your hospital stay. If you have any questions about your discharge medications or the care you received while you were in the hospital after you are discharged, you can call the unit and asked to speak with the hospitalist on call if the hospitalist that took care of you is  not available. Once you are discharged, your primary care physician will handle any further medical issues. Please note that no refills for any discharge medications will be authorized once you are discharged, as it is imperative that you return to your primary care physician (or establish a relationship with a primary care physician if you do not have one) for your aftercare needs so that they can reassess your need for medications and monitor your lab values.  You were cared for by a hospitalist during your hospital stay. If you have any questions about your discharge medications or the care you received while you were in the hospital after you are discharged, you can call the unit and asked to speak with the hospitalist on call if the hospitalist that took care of you is not available. Once you are discharged, your primary care physician will handle any further medical issues. Please note that NO REFILLS for any discharge medications will be authorized once you are discharged, as it is imperative that you return to your primary care physician (or establish a relationship with a primary care physician if you do not have one) for your aftercare needs so that they can reassess your need for medications and monitor your lab values.  Please request your Prim.MD to go over all Hospital Tests and Procedure/Radiological results at the follow up, please get all Hospital records sent to your Prim MD by signing hospital release before you go home.  Get CBC, CMP, 2 view Chest X ray checked  by Primary MD during your next visit or SNF MD in 5-7 days ( we routinely change or add medications that can affect your baseline labs and fluid status, therefore we recommend that you get the mentioned basic workup next visit with your PCP, your PCP may decide not to get them or add new tests based on their clinical decision)  On your next visit with your primary care physician please Get Medicines reviewed and adjusted.  If  you experience worsening of your admission symptoms, develop shortness of breath, life threatening emergency, suicidal or homicidal thoughts you must seek medical attention immediately by calling 911 or calling your MD immediately  if symptoms less severe.  You Must read complete instructions/literature along with all the possible adverse reactions/side effects for all the Medicines you take and that  have been prescribed to you. Take any new Medicines after you have completely understood and accpet all the possible adverse reactions/side effects.   Do not drive, operate heavy machinery, perform activities at heights, swimming or participation in water activities or provide baby sitting services if your were admitted for syncope or siezures until you have seen by Primary MD or a Neurologist and advised to do so again.  Do not drive when taking Pain medications.   Procedures/Studies: DG Chest Port 1 View  Result Date: 01/18/2023 CLINICAL DATA:  Weakness.  Patient fell and struck head. EXAM: PORTABLE CHEST 1 VIEW COMPARISON:  08/31/2022 FINDINGS: Normal heart size and pulmonary vascularity. No focal airspace disease or consolidation in the lungs. No blunting of costophrenic angles. No pneumothorax. Mediastinal contours appear intact. Degenerative changes in the spine. Calcification of the aorta. IMPRESSION: No active disease. Electronically Signed   By: Burman Nieves M.D.   On: 01/18/2023 19:41   CT HEAD WO CONTRAST  Result Date: 01/18/2023 CLINICAL DATA:  Abrasion above the right eye after fall on Monday with possible loss of consciousness EXAM: CT HEAD WITHOUT CONTRAST TECHNIQUE: Contiguous axial images were obtained from the base of the skull through the vertex without intravenous contrast. RADIATION DOSE REDUCTION: This exam was performed according to the departmental dose-optimization program which includes automated exposure control, adjustment of the mA and/or kV according to patient size and/or  use of iterative reconstruction technique. COMPARISON:  CT head 02/17/2022 FINDINGS: Brain: No intracranial hemorrhage, mass effect, or evidence of acute infarct. No hydrocephalus. No extra-axial fluid collection. Generalized cerebral atrophy. Ill-defined hypoattenuation within the cerebral white matter is nonspecific but consistent with chronic small vessel ischemic disease. Chronic left parietal infarct. Vascular: No hyperdense vessel. Intracranial arterial calcification. Skull: No fracture or focal lesion. Small contusion about the right forehead. Sinuses/Orbits: No acute finding. Frothy secretions left sphenoid sinus. Paranasal sinuses and mastoid air cells are otherwise well aerated. Other: None. IMPRESSION: 1. No acute intracranial abnormality. 2. Small contusion about the right forehead. No calvarial fracture. Electronically Signed   By: Minerva Fester M.D.   On: 01/18/2023 18:51   VAS US CAROTID  Result Date: 12/31/2022 Carotid Arterial Duplex Study Patient Name:  DEREK BIERMANN  Date of Exam:   12/30/2022 Medical Rec #: [id number]    Accession #:    [id number] Date of Birth: 1950-09-22    Patient Gender: F Patient Age:   79 years Exam Location:  [facility] Procedure:      VAS US CAROTID Referring Phys: HEATHER PEMBERTON --------------------------------------------------------------------------------  Indications:       Carotid artery disease and patient denies any current                    cerebrovascular symptoms. Risk Factors:      Hypertension, hyperlipidemia, Diabetes, past history of                    smoking, prior CVA. Other Factors:     Left carotid angioplasty and stenting on 11/19/2021. Comparison Study:  In 07/2021, a carotid artery duplex showed velocities of                    139/32 cm/s in the RICA and 339/94 cm/s in the LICA. Performing Technologist: Tyna Jaksch RVT  Examination Guidelines: A complete evaluation includes B-mode imaging, spectral Doppler, color Doppler, and power Doppler  as needed of all accessible portions of each vessel. Bilateral testing is considered an integral  part of a complete examination. Limited examinations for reoccurring indications may be performed as noted.  Right Carotid Findings: +----------+--------+--------+--------+------------------+---------+           PSV cm/sEDV cm/sStenosisPlaque DescriptionComments  +----------+--------+--------+--------+------------------+---------+ CCA Prox  106     8                                           +----------+--------+--------+--------+------------------+---------+ CCA Distal58      9       <50%    heterogenous                +----------+--------+--------+--------+------------------+---------+ ICA Prox  106     15      1-39%   calcific          Shadowing +----------+--------+--------+--------+------------------+---------+ ICA Distal94      20                                          +----------+--------+--------+--------+------------------+---------+ ECA       225     6       >50%    calcific          shadowing +----------+--------+--------+--------+------------------+---------+ +----------+--------+-------+---------+-------------------+           PSV cm/sEDV cmsDescribe Arm Pressure (mmHG) +----------+--------+-------+---------+-------------------+ Sherlyn Lick            Turbulent122                 +----------+--------+-------+---------+-------------------+ +---------+--------+--+--------+--+---------+ VertebralPSV cm/s88EDV cm/s16Antegrade +---------+--------+--+--------+--+---------+  Left Carotid Findings: +----------+--------+--------+--------+------------------+--------+           PSV cm/sEDV cm/sStenosisPlaque DescriptionComments +----------+--------+--------+--------+------------------+--------+ CCA Prox  110     7                                          +----------+--------+--------+--------+------------------+--------+ CCA Distal71       10      <50%    heterogenous               +----------+--------+--------+--------+------------------+--------+ ECA       179     11                                         +----------+--------+--------+--------+------------------+--------+ +----------+--------+--------+----------------+-------------------+           PSV cm/sEDV cm/sDescribe        Arm Pressure (mmHG) +----------+--------+--------+----------------+-------------------+ Subclavian172             Multiphasic, [id number]                 +----------+--------+--------+----------------+-------------------+ +---------+--------+--+--------+-+---------+ VertebralPSV cm/s22EDV cm/s6Antegrade +---------+--------+--+--------+-+---------+  Left Stent(s): +----------------------+--------+--------+--------+--------+--------+ Proximal to Distal ICAPSV cm/sEDV cm/sStenosisWaveformComments +----------------------+--------+--------+--------+--------+--------+ Prox to Stent         65      9                                +----------------------+--------+--------+--------+--------+--------+ Proximal Stent        60      7                                +----------------------+--------+--------+--------+--------+--------+  Mid Stent             62      15                               +----------------------+--------+--------+--------+--------+--------+ Distal Stent          60      14                               +----------------------+--------+--------+--------+--------+--------+ Distal to Stent       76      16                               +----------------------+--------+--------+--------+--------+--------+ Widely patent left proximal to distal ICA stent without evidence of stenosis.   Summary: Right Carotid: Velocities in the right ICA are consistent with a 1-39% stenosis.                Non-hemodynamically significant plaque <50% noted in the CCA. Left Carotid: Non-hemodynamically significant  plaque <50% noted in the CCA.               Widely patent left proximal to distal ICA stent without evidence               of stenosis. Vertebrals:  Bilateral vertebral arteries demonstrate antegrade flow. Subclavians: Right subclavian artery flow was disturbed. Normal flow              hemodynamics were seen in the left subclavian artery.  Incidental finding: Multiple nodules with some vascularity noted in the left parotid gland, with one measuring 1.3 x .7 x 1.2 cm and the second one measuring .8 x .6 x 1.1 cm. *See table(s) above for measurements and observations. Suggest follow up study in 12 months. Electronically signed by Lorine Bears MD on 12/31/2022 at 3:19:28 PM.    Final      The results of significant diagnostics from this hospitalization (including imaging, microbiology, ancillary and laboratory) are listed below for reference.     Microbiology: No results found for this or any previous visit (from the past 240 hour(s)).   Labs: BNP (last 3 results) No results for input(s): "BNP" in the last 8760 hours. Basic Metabolic Panel: Recent Labs  Lab 01/18/23 1845 01/18/23 2216 01/19/23 0759 01/20/23 0737 01/21/23 1013  NA 138  --  138 139 140  K 5.5*  --  3.7 3.6 3.8  CL 109  --  106 112* 112*  CO2 16*  --  17* 20* 20*  GLUCOSE 169*  --  85 110* 173*  BUN 55*  --  49* 33* 16  CREATININE 2.50*  --  2.04* 1.53* 1.38*  CALCIUM 9.5  --  9.0 8.8* 9.2  MG  --  2.0 1.8 1.7 1.5*  PHOS  --   --  3.9  --   --    Liver Function Tests: No results for input(s): "AST", "ALT", "ALKPHOS", "BILITOT", "PROT", "ALBUMIN" in the last 168 hours. No results for input(s): "LIPASE", "AMYLASE" in the last 168 hours. No results for input(s): "AMMONIA" in the last 168 hours. CBC: Recent Labs  Lab 01/18/23 1845 01/19/23 0759 01/20/23 0737 01/21/23 1013  WBC 12.9* 8.8 8.5 7.8  NEUTROABS  --  3.9  --   --   HGB 12.3 9.5*  9.1* 9.4*  HCT 40.3 30.8* 29.4* 31.0*  MCV 87.2 86.5 85.5 86.6  PLT 191  182 162 169   Cardiac Enzymes: Recent Labs  Lab 01/19/23 0759  CKTOTAL 41   BNP: Invalid input(s): "POCBNP" CBG: Recent Labs  Lab 01/20/23 0904 01/20/23 1222 01/20/23 1708 01/20/23 2041 01/21/23 0813  GLUCAP 136* 141* 228* 204* 92   D-Dimer No results for input(s): "DDIMER" in the last 72 hours. Hgb A1c No results for input(s): "HGBA1C" in the last 72 hours. Lipid Profile No results for input(s): "CHOL", "HDL", "LDLCALC", "TRIG", "CHOLHDL", "LDLDIRECT" in the last 72 hours. Thyroid function studies Recent Labs    01/19/23 0759  TSH 0.368   Anemia work up Recent Labs    01/19/23 0759  VITAMINB12 982*   Urinalysis    Component Value Date/Time   COLORURINE YELLOW 01/18/2023 2310   APPEARANCEUR CLEAR 01/18/2023 2310   LABSPEC >=1.030 01/18/2023 2310   PHURINE 5.5 01/18/2023 2310   GLUCOSEU 100 (A) 01/18/2023 2310   GLUCOSEU >=1000 (A) 03/29/2022 1114   HGBUR NEGATIVE 01/18/2023 2310   BILIRUBINUR NEGATIVE 01/18/2023 2310   KETONESUR NEGATIVE 01/18/2023 2310   PROTEINUR NEGATIVE 01/18/2023 2310   UROBILINOGEN 0.2 03/29/2022 1114   NITRITE NEGATIVE 01/18/2023 2310   LEUKOCYTESUR TRACE (A) 01/18/2023 2310   Sepsis Labs Recent Labs  Lab 01/18/23 1845 01/19/23 0759 01/20/23 0737 01/21/23 1013  WBC 12.9* 8.8 8.5 7.8   Microbiology No results found for this or any previous visit (from the past 240 hour(s)).   Time coordinating discharge:  I have spent 35 minutes face to face with the patient and on the ward discussing the patients care, assessment, plan and disposition with other care givers. >50% of the time was devoted counseling the patient about the risks and benefits of treatment/Discharge disposition and coordinating care.   SIGNED:   Dimple Nanas, MD  [facility] 01/21/2023, 12:13 PM   If 7PM-7AM, please contact night-coverage

## 2023-01-21 NOTE — Progress Notes (Signed)
Occupational Therapy Treatment Patient Details Name: Sonia Skinner MRN: [id number] DOB: 09/27/1950 Today's Date: 01/21/2023   History of present illness 72 yo female presents to [facility] on 6/4 with fall, LOC. Workup for AKI, dehydration. PMH includes GERD, HTN, R MCA CVA 2015, ORIF ankle fx 2019, DM with neuropathy on ozempic, ACDF, CAD with history of stenting and endarterectomy, former smoker.   OT comments  Pt seen for further assessment of cognition using the Medi-cog. Pt received a score of 4/10, which indicates impairment with working memory and higher level executive functioning skills. Plan is for pt to follow up with otpt OT to maximize functional level of safety and independence with IADL tasks. Pt states she has not seen a neurologist yet - given her difficulty with cognition, which she states is "getting worse", recommend follow up with neurology as an outpt. Pt verbalized understanding.    Recommendations for follow up therapy are one component of a multi-disciplinary discharge planning process, led by the attending physician.  Recommendations may be updated based on patient status, additional functional criteria and insurance authorization.    Assistance Recommended at Discharge Intermittent Supervision/Assistance  Patient can return home with the following  Assist for transportation;Direct supervision/assist for financial management;Direct supervision/assist for medications management   Equipment Recommendations  None recommended by OT    Recommendations for Other Services      Precautions / Restrictions Precautions Precaution Comments: falls due to suspected hypotension, dehydration Restrictions Weight Bearing Restrictions: No       Mobility Bed Mobility                    Transfers                         Balance                                           ADL either performed or assessed with clinical judgement   ADL Overall ADL's  : Modified independent                                            Extremity/Trunk Assessment              Vision       Perception     Praxis      Cognition Arousal/Alertness: Awake/alert Behavior During Therapy: WFL for tasks assessed/performed Overall Cognitive Status: No family/caregiver present to determine baseline cognitive functioning                                 General Comments: Pt assessed with the Medi-cog. Scored a 4/5 on the mini-cog (2/3 word recall) and 0/5 on the medi-cog, giving her a total score of 4/10. difficulty noted with working memory; planning, Forensic psychologist. Pt uses strategies for difficulty with memory, i.e. alarms for medicaitons and apointments at baseline.        Exercises      Shoulder Instructions       General Comments      Pertinent Vitals/ Pain          Home Living  Prior Functioning/Environment              Frequency  Min 2X/week        Progress Toward Goals  OT Goals(current goals can now be found in the care plan section)  Progress towards OT goals: Progressing toward goals  Acute Rehab OT Goals Patient Stated Goal: work on her memory OT Goal Formulation: With patient Time For Goal Achievement: 02/02/23 Potential to Achieve Goals: Good ADL Goals Additional ADL Goal #1: Pt will perform 5 step path finding with mod I. Additional ADL Goal #2: Pt will perform pillbox test with mod I.  Plan Discharge plan remains appropriate    Co-evaluation                 AM-PAC OT "6 Clicks" Daily Activity     Outcome Measure   Help from another person eating meals?: None Help from another person taking care of personal grooming?: None Help from another person toileting, which includes using toliet, bedpan, or urinal?: None Help from another person bathing (including washing, rinsing, drying)?: None Help from another  person to put on and taking off regular upper body clothing?: None Help from another person to put on and taking off regular lower body clothing?: None 6 Click Score: 24    End of Session    OT Visit Diagnosis: Other symptoms and signs involving cognitive function   Activity Tolerance Patient tolerated treatment well   Patient Left in bed;with call bell/phone within reach   Nurse Communication Other (comment) (DC needs)        Time: 1610-9604 OT Time Calculation (min): 23 min  Charges: OT General Charges $OT Visit: 1 Visit OT Treatments $Self Care/Home Management : 23-37 mins  Luisa Dago, OT/L   Acute OT Clinical Specialist Acute Rehabilitation Services Pager [phone] Office [phone]   [facility] 01/21/2023, 10:56 AM

## 2023-01-21 NOTE — TOC Transition Note (Signed)
Transition of Care [facility]) - CM/SW Discharge Note   Patient Details  Name: Sonia Skinner MRN: [id number] Date of Birth: 29-Jan-1951  Transition of Care [facility]) CM/SW Contact:  Janae Bridgeman, RN Phone Number: 01/21/2023, 10:47 AM   Clinical Narrative:    Patient is being discharged home with family today by car.  Referral to OUtpatient OT was placed earlier in the week.  Beside nursing to follow up to discharge patient home.   Final next level of care: OP Rehab Barriers to Discharge: Continued Medical Work up   Patient Goals and CMS Choice CMS Medicare.gov Compare Post Acute Care list provided to:: Patient Choice offered to / list presented to : Patient  Discharge Placement                         Discharge Plan and Services Additional resources added to the After Visit Summary for     Discharge Planning Services: CM Consult Post Acute Care Choice: Resumption of Svcs/PTA Provider                               Social Determinants of Health (SDOH) Interventions SDOH Screenings   Food Insecurity: No Food Insecurity (01/19/2023)  Housing: Patient Declined (01/19/2023)  Transportation Needs: No Transportation Needs (01/19/2023)  Utilities: Not At Risk (01/19/2023)  Alcohol Screen: Low Risk  (01/28/2022)  Depression (PHQ2-9): High Risk (03/29/2022)  Financial Resource Strain: Low Risk  (01/28/2022)  Physical Activity: Insufficiently Active (01/28/2022)  Social Connections: Moderately Integrated (01/28/2022)  Stress: No Stress Concern Present (01/28/2022)  Tobacco Use: Medium Risk (01/19/2023)     Readmission Risk Interventions    01/19/2023    2:37 PM  Readmission Risk Prevention Plan  Transportation Screening Complete  PCP or Specialist Appt within 3-5 Days Complete  HRI or Home Care Consult Complete  Social Work Consult for Recovery Care Planning/Counseling Complete  Palliative Care Screening Complete  Medication Review Ocean[facility]) Complete

## 2023-01-21 NOTE — Care Management Important Message (Signed)
Important Message  Patient Details  Name: Sonia Skinner MRN: [id number] Date of Birth: 1950-10-27   Medicare Important Message Given:  Yes  Patient left prior to Im delivery will mail a copy to the patient home address.    Matej Sappenfield 01/21/2023, 1:12 PM

## 2023-01-24 ENCOUNTER — Telehealth: Payer: Self-pay

## 2023-01-24 NOTE — Transitions of Care (Post Inpatient/ED Visit) (Signed)
01/24/2023  Name: Sonia Skinner MRN: [id number] DOB: [birth year]  Today's TOC FU Call Status: Today's TOC FU Call Status:: Successful TOC FU Call Competed TOC FU Call Complete Date: 01/24/23  Transition Care Management Follow-up Telephone Call Date of Discharge: 01/21/23 Discharge Facility: [facility]) Type of Discharge: Inpatient Admission Primary Inpatient Discharge Diagnosis:: Acute Kidney Injury How have you been since you were released from the hospital?: Same (Patient is not feeling better) Any questions or concerns?: Yes Patient Questions/Concerns:: I am very weak, do not feel like I am getting better Patient Questions/Concerns Addressed: Other: (obtained appt on 01/25/23)  Items Reviewed: Did you receive and understand the discharge instructions provided?: Yes Medications obtained,verified, and reconciled?: Partial Review Completed Reason for Partial Mediation Review: Patient not feeling well, discussed stopped medications and a few others Any new allergies since your discharge?: No Dietary orders reviewed?: No Do you have support at home?: Yes People in Home: sibling(s) Name of Support/Comfort Primary Source: Patients sister- Jamesetta So  Medications Reviewed Today: Medications Reviewed Today     Reviewed by Jodelle Gross, RN (Case Manager) on 01/24/23 at 1617  Med List Status: <None>   Medication Order Taking? Sig Documenting Provider Last Dose Status Informant  Accu-Chek FastClix Lancets MISC [id number]  Use to check blood sugar five times daily. Etta Grandchild, MD  Active Self, Pharmacy Records  ACCU-CHEK GUIDE test strip [id number]  Use to check blood sugar twice a day. E11.9 Etta Grandchild, MD  Active Self, Pharmacy Records  amLODipine [facility]) 10 MG tablet [id number] Yes Take 1 tablet (10 mg total) by mouth daily.  Patient taking differently: Take 5 mg by mouth daily.   Gaston Islam., NP Taking Active Self, Pharmacy Records  aspirin 81 MG chewable tablet  [id number]  Chew 1 tablet (81 mg total) by mouth daily. Charlott Holler, MD  Active Self, Pharmacy Records  atorvastatin (LIPITOR) 40 MG tablet [id number]  Take 1 tablet by mouth daily. Etta Grandchild, MD  Active Self, Pharmacy Records  benztropine (COGENTIN) 0.5 MG tablet [id number]  Take 1 tablet (0.5 mg total) by mouth at bedtime. Cleotis Nipper, MD  Active Self, Pharmacy Records  brexpiprazole (REXULTI) 1 MG TABS tablet [id number]  Take 1 tablet (1 mg total) by mouth daily. Cleotis Nipper, MD  Active Self, Pharmacy Records  buPROPion (WELLBUTRIN XL) 300 MG 24 hr tablet [id number]  Take 1 tablet (300 mg total) by mouth daily. Cleotis Nipper, MD  Active Self, Pharmacy Records  Cholecalciferol (VITAMIN D3 PO) [id number]  Take 1 tablet by mouth daily. Unsure of dose [provider]  Active Self, Pharmacy Records  Continuous Blood Gluc Receiver [facility] [place]) DEVI [id number]  1 Act by Does not apply route daily. Etta Grandchild, MD  Active Self, Pharmacy Records  Continuous Blood Gluc Sensor [facility] G7 SENSOR) [place] [id number]  1 Act by Does not apply route daily. Etta Grandchild, MD  Active Self, Pharmacy Records  Continuous Blood Gluc Sensor [facility] G7 SENSOR) [place] [id number]  1 Act by Does not apply route daily. Etta Grandchild, MD  Active Self, Pharmacy Records  Continuous Blood Gluc Sensor [facility] G7 SENSOR) [place] [id number]  USE AS DIRECTED TOPICALLY  EVERY  10  DAYS Etta Grandchild, MD  Active Self, Pharmacy Records  dexlansoprazole [facility]) 60 MG capsule [id number]  Take 1 capsule by mouth daily. Etta Grandchild, MD  Active Self, Pharmacy Records  EDARBYCLOR 40-12.5 MG TABS [id number]  Take  1 tablet by mouth daily. Etta Grandchild, MD  Active Self, Pharmacy Records  ezetimibe (ZETIA) 10 MG tablet [id number] Yes Take 1 tablet by mouth once daily. Etta Grandchild, MD Taking Active Self, Pharmacy Records  gabapentin (NEURONTIN) 100 MG capsule [id number] No Take 1 capsule (100 mg total) by mouth 3  (three) times daily.  Patient not taking: Reported on 01/24/2023   Etta Grandchild, MD Not Taking Active Self, Pharmacy Records  [facility] 200 UNIT/ML KwikPen [id number]  Inject 5 units under the skin three times daily with meals.  Patient not taking: Reported on 01/19/2023   Etta Grandchild, MD  Active Self, Pharmacy Records  HYDROcodone-acetaminophen [facility]) 5-325 MG tablet [id number]  Take 1 tablet by mouth every 6 (six) hours as needed for moderate pain. Etta Grandchild, MD  Active Self, Pharmacy Records  insulin glargine, 1 Unit Dial, (TOUJEO SOLOSTAR) 300 UNIT/ML Solostar Pen [id number]  Inject 20-22 Units into the skin daily. Carlus Pavlov, MD  Active Self, Pharmacy Records  levothyroxine (SYNTHROID) 88 MCG tablet [id number] Yes Take 1 tablet (88 mcg total) by mouth daily. Carlus Pavlov, MD Taking Active Self, Pharmacy Records  loperamide (IMODIUM) 2 MG capsule [id number]  Take 1 capsule (2 mg total) by mouth 4 (four) times daily as needed for diarrhea or loose stools. Horton, Mayer Masker, MD  Active Self, Pharmacy Records  loratadine (CLARITIN) 10 MG tablet [id number]  Take 1 tablet (10 mg total) by mouth daily. Etta Grandchild, MD  Active Self, Pharmacy Records  lubiprostone [facility]) 24 MCG capsule [id number] No Take 1 capsule (24 mcg total) by mouth 2 (two) times daily with a meal.  Patient not taking: Reported on 01/19/2023   Etta Grandchild, MD Not Taking Active Self, Pharmacy Records  traZODone (DESYREL) 50 MG tablet [id number]  Take 1 tablet (50 mg total) by mouth at bedtime as needed for sleep.  Patient taking differently: Take 50 mg by mouth at bedtime as needed for sleep.   Cleotis Nipper, MD  Active Self, Pharmacy Records            Home Care and Equipment/Supplies: Were Home Health Services Ordered?: No Any new equipment or medical supplies ordered?: No  Functional Questionnaire: Do you need assistance with bathing/showering or dressing?: No Do you need assistance  with meal preparation?: No Do you need assistance with eating?: No Do you have difficulty maintaining continence: No Do you need assistance with getting out of bed/getting out of a chair/moving?: No Do you have difficulty managing or taking your medications?: No  Follow up appointments reviewed: PCP Follow-up appointment confirmed?: Yes Date of PCP follow-up appointment?: 01/27/23 Follow-up Provider: Hetty Blend, NP Specialist Hospital Follow-up appointment confirmed?: NA Do you need transportation to your follow-up appointment?: No Do you understand care options if your condition(s) worsen?: Yes-patient verbalized understanding  SDOH Interventions Today    Flowsheet Row Most Recent Value  SDOH Interventions   Food Insecurity Interventions Intervention Not Indicated  Transportation Interventions Intervention Not Indicated  Utilities Interventions Intervention Not Indicated      Interventions Today    Flowsheet Row Most Recent Value  Chronic Disease   Chronic disease during today's visit Diabetes, Hypertension (HTN), Congestive Heart Failure (CHF)       TOC Interventions Today    Flowsheet Row Most Recent Value  TOC Interventions   TOC Interventions Discussed/Reviewed TOC Interventions Discussed, TOC Interventions Reviewed, Arranged PCP follow up within 7 days/Care Guide scheduled  Jodelle Gross, RN, BSN, CCM Care Management Coordinator [facility] Phone: [phone]/Fax: [phone]

## 2023-01-27 ENCOUNTER — Ambulatory Visit (INDEPENDENT_AMBULATORY_CARE_PROVIDER_SITE_OTHER): Payer: 59 | Admitting: Family Medicine

## 2023-01-27 ENCOUNTER — Encounter: Payer: Self-pay | Admitting: Family Medicine

## 2023-01-27 VITALS — BP 130/56 | HR 76 | Temp 97.8°F | Ht 62.0 in | Wt 116.0 lb

## 2023-01-27 DIAGNOSIS — R531 Weakness: Secondary | ICD-10-CM | POA: Diagnosis not present

## 2023-01-27 DIAGNOSIS — R630 Anorexia: Secondary | ICD-10-CM

## 2023-01-27 DIAGNOSIS — N1832 Chronic kidney disease, stage 3b: Secondary | ICD-10-CM | POA: Diagnosis not present

## 2023-01-27 DIAGNOSIS — E1142 Type 2 diabetes mellitus with diabetic polyneuropathy: Secondary | ICD-10-CM | POA: Diagnosis not present

## 2023-01-27 DIAGNOSIS — R42 Dizziness and giddiness: Secondary | ICD-10-CM | POA: Diagnosis not present

## 2023-01-27 DIAGNOSIS — Z794 Long term (current) use of insulin: Secondary | ICD-10-CM | POA: Diagnosis not present

## 2023-01-27 DIAGNOSIS — Z8673 Personal history of transient ischemic attack (TIA), and cerebral infarction without residual deficits: Secondary | ICD-10-CM | POA: Diagnosis not present

## 2023-01-27 DIAGNOSIS — Z09 Encounter for follow-up examination after completed treatment for conditions other than malignant neoplasm: Secondary | ICD-10-CM

## 2023-01-27 LAB — GLUCOSE, POCT (MANUAL RESULT ENTRY): POC Glucose: 160 mg/dl — AB (ref 70–99)

## 2023-01-27 LAB — CBC WITH DIFFERENTIAL/PLATELET
Basophils Absolute: 0.1 10*3/uL (ref 0.0–0.1)
Basophils Relative: 0.6 % (ref 0.0–3.0)
Eosinophils Absolute: 0.2 10*3/uL (ref 0.0–0.7)
Eosinophils Relative: 2.2 % (ref 0.0–5.0)
HCT: 36.7 % (ref 36.0–46.0)
Hemoglobin: 11.4 g/dL — ABNORMAL LOW (ref 12.0–15.0)
Lymphocytes Relative: 37.4 % (ref 12.0–46.0)
Lymphs Abs: 3.6 10*3/uL (ref 0.7–4.0)
MCHC: 31 g/dL (ref 30.0–36.0)
MCV: 85.8 fl (ref 78.0–100.0)
Monocytes Absolute: 0.7 10*3/uL (ref 0.1–1.0)
Monocytes Relative: 7.2 % (ref 3.0–12.0)
Neutro Abs: 5 10*3/uL (ref 1.4–7.7)
Neutrophils Relative %: 52.6 % (ref 43.0–77.0)
Platelets: 219 10*3/uL (ref 150.0–400.0)
RBC: 4.28 Mil/uL (ref 3.87–5.11)
RDW: 15.7 % — ABNORMAL HIGH (ref 11.5–15.5)
WBC: 9.6 10*3/uL (ref 4.0–10.5)

## 2023-01-27 LAB — MAGNESIUM: Magnesium: 1.6 mg/dL (ref 1.5–2.5)

## 2023-01-27 LAB — BASIC METABOLIC PANEL
BUN: 21 mg/dL (ref 6–23)
CO2: 27 mEq/L (ref 19–32)
Calcium: 9.8 mg/dL (ref 8.4–10.5)
Chloride: 102 mEq/L (ref 96–112)
Creatinine, Ser: 2.15 mg/dL — ABNORMAL HIGH (ref 0.40–1.20)
GFR: 22.3 mL/min — ABNORMAL LOW (ref >60.00)
Glucose, Bld: 161 mg/dL — ABNORMAL HIGH (ref 70–99)
Potassium: 4.3 mEq/L (ref 3.5–5.1)
Sodium: 139 mEq/L (ref 135–145)

## 2023-01-27 NOTE — Patient Instructions (Signed)
Continue to do hold Ozempic and [place].  Do not do your mealtime insulin, Humalog, if you are not eating.  Continue Toujeo.  I strongly encourage you to start using the Dexcom to monitor your blood sugars.  If you are unable to eat, drink enough fluids to stay hydrated, you will need to go back to the emergency department or call 911.

## 2023-01-27 NOTE — Progress Notes (Signed)
Please send her lab results from today to her nephrologist.  Dr. Valentino Nose at [facility].  She does not have an appointment with them until July 3 and they may want to see her sooner. Also, please schedule her with Dr. Yetta Barre next week.

## 2023-01-27 NOTE — Progress Notes (Signed)
Subjective:     Patient ID: Sonia Skinner, female    DOB: 1951/05/11, 72 y.o.   MRN: [id number]  Chief Complaint  Patient presents with   Hospitalization Follow-up    D/c 6/4 [facility], feeling weaker within the last week. Still not eating much. Only had 1 taco last night, didn't eat this morning  Syncope episode was due to Ozempic losing her appetite and wasn't eating much. Got up to go to bathroom and passed out.     HPI  Discussed the use of AI scribe software for clinical note transcription with the patient, who gave verbal consent to proceed.  History of Present Illness          Admit date: 01/18/2023 Discharge date: 01/21/2023   Admitted From: Home Disposition:  Home  Her sister lives with her.   She has an appt with nephrology 02/16/23, Dr. Melanee Spry.   She was on Ozempic for years and stopped it in early June.   She was dehydrated   Drinking fluids now but unable to tolerate much. Drinking pedialyte and water. Urine is still dark per patient.   Weak and dizzy, worse than when she was discharged home.   Not able to eat much food.   Does not have her Dexcom on. She has it at home.   She is using 22 units of Toujeo.  Not taking Humalog  Not on Farxiga and Ozempic.     Health Maintenance Due  Topic Date Due   Lung Cancer Screening  Never done   Zoster Vaccines- Shingrix (1 of 2) Never done   OPHTHALMOLOGY EXAM  11/26/2022   Medicare Annual Wellness (AWV)  01/29/2023    Past Medical History:  Diagnosis Date   Ankle fracture    Left   Anxiety    Carotid artery occlusion    Closed fracture of left distal fibula 09/16/2017   Complication of anesthesia    ALLERY TO ESTER BASE   Depression    early 30s   Diabetes mellitus    INSULIN DEPENDENT   GERD (gastroesophageal reflux disease)    Headache    years ago   Hypertension    Pneumonia    Stroke [facility]) March 2015    left MCA infarct, slight weakness on left side   Thyroid disease     Past Surgical  History:  Procedure Laterality Date   ABDOMINAL HYSTERECTOMY     ANTERIOR FIXATION AND POSTERIOR MICRODISCECTOMY CERVICAL SPINE  1999   CAROTID ENDARTERECTOMY Left 11-04-13   cea   ENDARTERECTOMY Left 11/04/2013   IR ANGIO INTRA EXTRACRAN SEL COM CAROTID INNOMINATE BILAT MOD SED  11/16/2021   IR ANGIO INTRA EXTRACRAN SEL COM CAROTID INNOMINATE UNI L MOD SED  11/19/2021   IR ANGIO VERTEBRAL SEL VERTEBRAL BILAT MOD SED  11/16/2021   IR CT HEAD LTD  11/19/2021   IR INTRAVSC STENT CERV CAROTID W/EMB-PROT MOD SED INCL ANGIO  11/19/2021   IR RADIOLOGIST EVAL & MGMT  12/13/2021   ORIF ANKLE FRACTURE Left 09/16/2017   Procedure: OPEN REDUCTION INTERNAL FIXATION (ORIF) LEFT ANKLE FRACTURE;  Surgeon: Teryl Lucy, MD;  Location: [facility] OR;  Service: Orthopedics;  Laterality: Left;   RADIOLOGY WITH ANESTHESIA N/A 11/19/2021   Procedure: Left carotid angioplasty with possible stenting;  Surgeon: Julieanne Cotton, MD;  Location: [facility] OR;  Service: Radiology;  Laterality: N/A;    Family History  Problem Relation Age of Onset   Diabetes Mother    Heart disease Mother  Before age 63   Cancer Father        Lung   Hypertension Sister    Diabetes Sister    Diabetes Sister    Diabetes Sister     Social History   Socioeconomic History   Marital status: Legally Separated    Spouse name: Not on file   Number of children: 0   Years of education: College   Highest education level: Not on file  Occupational History   Occupation: [employer]    Employer: [employer]  Tobacco Use   Smoking status: Former    Packs/day: 1.00    Years: 20.00    Additional pack years: 0.00    Total pack years: 20.00    Types: Cigarettes    Quit date: 11/01/2013    Years since quitting: 9.2   Smokeless tobacco: Never  Vaping Use   Vaping Use: Never used  Substance and Sexual Activity   Alcohol use: Not Currently    Alcohol/week: 1.0 - 2.0 standard drink of alcohol    Types: 1 - 2 Standard drinks or equivalent per week   Drug  use: No   Sexual activity: Not Currently  Other Topics Concern   Not on file  Social History Narrative   Patient lives in 1 story home with sister.   Caffeine Use: 2 cups daily; sodas occasionally   Some college education   Works as Lawyer for [employer]   Social Determinants of Health   Financial Resource Strain: Low Risk  (01/28/2022)   Overall Financial Resource Strain (CARDIA)    Difficulty of Paying Living Expenses: Not hard at all  Food Insecurity: No Food Insecurity (01/24/2023)   Hunger Vital Sign    Worried About Running Out of Food in the Last Year: Never true    Ran Out of Food in the Last Year: Never true  Transportation Needs: No Transportation Needs (01/24/2023)   PRAPARE - Administrator, Civil Service (Medical): No    Lack of Transportation (Non-Medical): No  Physical Activity: Insufficiently Active (01/28/2022)   Exercise Vital Sign    Days of Exercise per Week: 1 day    Minutes of Exercise per Session: 30 min  Stress: No Stress Concern Present (01/28/2022)   [employer] of Occupational Health - Occupational Stress Questionnaire    Feeling of Stress : Not at all  Social Connections: Moderately Integrated (01/28/2022)   Social Connection and Isolation Panel [NHANES]    Frequency of Communication with Friends and Family: More than three times a week    Frequency of Social Gatherings with Friends and Family: More than three times a week    Attends Religious Services: More than 4 times per year    Active Member of [employer] or Organizations: Yes    Attends Banker Meetings: More than 4 times per year    Marital Status: Separated  Intimate Partner Violence: Not At Risk (01/19/2023)   Humiliation, Afraid, Rape, and Kick questionnaire    Fear of Current or Ex-Partner: No    Emotionally Abused: No    Physically Abused: No    Sexually Abused: No    Outpatient Medications Prior to Visit  Medication Sig Dispense Refill   Accu-Chek FastClix  Lancets MISC Use to check blood sugar five times daily. 510 each 2   ACCU-CHEK GUIDE test strip Use to check blood sugar twice a day. E11.9 100 each 2   amLODipine (NORVASC) 10 MG tablet Take 1 tablet (10  mg total) by mouth daily. (Patient taking differently: Take 5 mg by mouth daily.) 90 tablet 3   aspirin 81 MG chewable tablet Chew 1 tablet (81 mg total) by mouth daily. 30 tablet 1   atorvastatin (LIPITOR) 40 MG tablet Take 1 tablet by mouth daily. 90 tablet 0   benztropine (COGENTIN) 0.5 MG tablet Take 1 tablet (0.5 mg total) by mouth at bedtime. 30 tablet 1   brexpiprazole (REXULTI) 1 MG TABS tablet Take 1 tablet (1 mg total) by mouth daily. 30 tablet 1   buPROPion (WELLBUTRIN XL) 300 MG 24 hr tablet Take 1 tablet (300 mg total) by mouth daily. 30 tablet 1   Cholecalciferol (VITAMIN D3 PO) Take 1 tablet by mouth daily. Unsure of dose     Continuous Blood Gluc Receiver (DEXCOM G7 RECEIVER) DEVI 1 Act by Does not apply route daily. 2 each 5   Continuous Blood Gluc Sensor (DEXCOM G7 SENSOR) MISC 1 Act by Does not apply route daily. 2 each 5   Continuous Blood Gluc Sensor (DEXCOM G7 SENSOR) MISC 1 Act by Does not apply route daily. 2 each 5   Continuous Blood Gluc Sensor (DEXCOM G7 SENSOR) MISC USE AS DIRECTED TOPICALLY  EVERY  10  DAYS 18 each 1   dexlansoprazole (DEXILANT) 60 MG capsule Take 1 capsule by mouth daily. 90 capsule 0   EDARBYCLOR 40-12.5 MG TABS Take 1 tablet by mouth daily. 90 tablet 0   ezetimibe (ZETIA) 10 MG tablet Take 1 tablet by mouth once daily. 90 tablet 1   gabapentin (NEURONTIN) 100 MG capsule Take 1 capsule (100 mg total) by mouth 3 (three) times daily. 270 capsule 1   HUMALOG KWIKPEN 200 UNIT/ML KwikPen Inject 5 units under the skin three times daily with meals. 9 mL 0   HYDROcodone-acetaminophen (NORCO) 5-325 MG tablet Take 1 tablet by mouth every 6 (six) hours as needed for moderate pain. 100 tablet 0   insulin glargine, 1 Unit Dial, (TOUJEO SOLOSTAR) 300 UNIT/ML  Solostar Pen Inject 20-22 Units into the skin daily. 18 mL 3   levothyroxine (SYNTHROID) 88 MCG tablet Take 1 tablet (88 mcg total) by mouth daily. 90 tablet 3   loperamide (IMODIUM) 2 MG capsule Take 1 capsule (2 mg total) by mouth 4 (four) times daily as needed for diarrhea or loose stools. 12 capsule 0   loratadine (CLARITIN) 10 MG tablet Take 1 tablet (10 mg total) by mouth daily. 90 tablet 0   lubiprostone (AMITIZA) 24 MCG capsule Take 1 capsule (24 mcg total) by mouth 2 (two) times daily with a meal. 180 capsule 1   traZODone (DESYREL) 50 MG tablet Take 1 tablet (50 mg total) by mouth at bedtime as needed for sleep. (Patient taking differently: Take 50 mg by mouth at bedtime as needed for sleep.) 30 tablet 0   No facility-administered medications prior to visit.    Allergies  Allergen Reactions   Anesthetics, Ester Anaphylaxis    Review of Systems  Constitutional:  Positive for malaise/fatigue. Negative for chills and fever.  Respiratory:  Negative for shortness of breath.   Cardiovascular:  Negative for chest pain, palpitations and leg swelling.  Gastrointestinal:  Negative for abdominal pain, constipation, diarrhea, nausea and vomiting.  Genitourinary:  Negative for dysuria, frequency and urgency.  Musculoskeletal:  Negative for falls.  Neurological:  Positive for dizziness. Negative for sensory change and focal weakness.       Objective:    Physical Exam Constitutional:  General: She is not in acute distress.    Appearance: She is ill-appearing.  HENT:     Mouth/Throat:     Mouth: Mucous membranes are moist.  Eyes:     Extraocular Movements: Extraocular movements intact.     Conjunctiva/sclera: Conjunctivae normal.  Cardiovascular:     Rate and Rhythm: Normal rate and regular rhythm.  Pulmonary:     Effort: Pulmonary effort is normal.     Breath sounds: Normal breath sounds.  Musculoskeletal:     Cervical back: Normal range of motion and neck supple.  Skin:     General: Skin is warm and dry.  Neurological:     General: No focal deficit present.     Mental Status: She is alert and oriented to person, place, and time.     Gait: Gait normal.  Psychiatric:        Mood and Affect: Mood normal.        Behavior: Behavior normal.        Thought Content: Thought content normal.      BP (!) 130/56 (BP Location: Left Arm, Patient Position: Sitting, Cuff Size: Normal)   Pulse 76   Temp 97.8 F (36.6 C) (Temporal)   Ht 5\' 2"  (1.575 m)   Wt 116 lb (52.6 kg)   SpO2 98%   BMI 21.22 kg/m  Wt Readings from Last 3 Encounters:  01/27/23 116 lb (52.6 kg)  01/18/23 123 lb 7.3 oz (56 kg)  12/31/22 123 lb 12.8 oz (56.2 kg)       Assessment & Plan:   Problem List Items Addressed This Visit       Endocrine   DM2 (diabetes mellitus, type 2) (HCC)   Relevant Orders   POCT glucose (manual entry) (Completed)   CBC with Differential/Platelet (Completed)   Basic metabolic panel (Completed)   Other Visit Diagnoses     General weakness    -  Primary   Relevant Orders   POCT glucose (manual entry) (Completed)   CBC with Differential/Platelet (Completed)   Basic metabolic panel (Completed)   Magnesium (Completed)   Dizziness       Relevant Orders   POCT glucose (manual entry) (Completed)   Hospital discharge follow-up       Relevant Orders   CBC with Differential/Platelet (Completed)   Basic metabolic panel (Completed)   Poor appetite       History of CVA (cerebrovascular accident)       Hypomagnesemia       Relevant Orders   Magnesium (Completed)   Stage 3b chronic kidney disease (HCC)       Relevant Orders   Basic metabolic panel (Completed)      Reviewed hospital discharge summary and results.  Reviewed medications with patient.  PCOT fasting BS 160.  She is borderline orthostatic.  She has not eaten, had fluids or taken medication yet today.  Check labs.  Strict precautions to call 911 if she has any worsening dizziness, weakness  or if unable to eat or drink fluids.  Recommend pushing fluids and follow up with PCP and nephrologist as scheduled.    I am having Don Perking. Choinski maintain her Accu-Chek FastClix Lancets, loratadine, Cholecalciferol (VITAMIN D3 PO), aspirin, Dexcom G7 Sensor, Dexcom G7 Receiver, Dexcom G7 Sensor, gabapentin, loperamide, amLODipine, levothyroxine, Dexcom G7 Sensor, lubiprostone, atorvastatin, dexlansoprazole, HumaLOG KwikPen, ezetimibe, Edarbyclor, HYDROcodone-acetaminophen, Accu-Chek Guide, Toujeo SoloStar, buPROPion, brexpiprazole, traZODone, and benztropine.  No orders of the defined types were placed in this encounter.

## 2023-02-10 DIAGNOSIS — N1831 Chronic kidney disease, stage 3a: Secondary | ICD-10-CM | POA: Diagnosis not present

## 2023-02-12 ENCOUNTER — Other Ambulatory Visit: Payer: Self-pay | Admitting: Internal Medicine

## 2023-02-15 ENCOUNTER — Ambulatory Visit: Payer: 59 | Attending: Internal Medicine | Admitting: Physical Therapy

## 2023-02-15 DIAGNOSIS — R2681 Unsteadiness on feet: Secondary | ICD-10-CM | POA: Insufficient documentation

## 2023-02-15 DIAGNOSIS — R6889 Other general symptoms and signs: Secondary | ICD-10-CM | POA: Diagnosis not present

## 2023-02-15 DIAGNOSIS — S0990XA Unspecified injury of head, initial encounter: Secondary | ICD-10-CM | POA: Diagnosis not present

## 2023-02-15 DIAGNOSIS — R2689 Other abnormalities of gait and mobility: Secondary | ICD-10-CM | POA: Insufficient documentation

## 2023-02-15 DIAGNOSIS — M6281 Muscle weakness (generalized): Secondary | ICD-10-CM | POA: Insufficient documentation

## 2023-02-15 NOTE — Therapy (Signed)
OUTPATIENT PHYSICAL THERAPY NEURO EVALUATION   Patient Name: Sonia Skinner MRN: [id number] DOB:[birth year], 72 y.o., female Today's Date: 02/16/2023   PCP: Etta Grandchild., MD REFERRING PROVIDER: Dimple Nanas, MD  END OF SESSION:  PT End of Session - 02/16/23 1500     Visit Number 1    Number of Visits 5    Date for PT Re-Evaluation 03/25/23   pushed out due to schedule availability   Authorization Type [facility] Medicare    Authorization Time Period 02-15-23 - 04-18-23    PT Start Time 1403    PT Stop Time 1445    PT Time Calculation (min) 42 min    Activity Tolerance Patient tolerated treatment well    Behavior During Therapy [facility] for tasks assessed/performed             Past Medical History:  Diagnosis Date   Ankle fracture    Left   Anxiety    Carotid artery occlusion    Closed fracture of left distal fibula 09/16/2017   Complication of anesthesia    ALLERY TO ESTER BASE   Depression    early 30s   Diabetes mellitus    INSULIN DEPENDENT   GERD (gastroesophageal reflux disease)    Headache    years ago   Hypertension    Pneumonia    Stroke [facility]) March 2015    left MCA infarct, slight weakness on left side   Thyroid disease    Past Surgical History:  Procedure Laterality Date   ABDOMINAL HYSTERECTOMY     ANTERIOR FIXATION AND POSTERIOR MICRODISCECTOMY CERVICAL SPINE  1999   CAROTID ENDARTERECTOMY Left 11-04-13   cea   ENDARTERECTOMY Left 11/04/2013   IR ANGIO INTRA EXTRACRAN SEL COM CAROTID INNOMINATE BILAT MOD SED  11/16/2021   IR ANGIO INTRA EXTRACRAN SEL COM CAROTID INNOMINATE UNI L MOD SED  11/19/2021   IR ANGIO VERTEBRAL SEL VERTEBRAL BILAT MOD SED  11/16/2021   IR CT HEAD LTD  11/19/2021   IR INTRAVSC STENT CERV CAROTID W/EMB-PROT MOD SED INCL ANGIO  11/19/2021   IR RADIOLOGIST EVAL & MGMT  12/13/2021   ORIF ANKLE FRACTURE Left 09/16/2017   Procedure: OPEN REDUCTION INTERNAL FIXATION (ORIF) LEFT ANKLE FRACTURE;  Surgeon: Teryl Lucy, MD;  Location: [facility] OR;   Service: Orthopedics;  Laterality: Left;   RADIOLOGY WITH ANESTHESIA N/A 11/19/2021   Procedure: Left carotid angioplasty with possible stenting;  Surgeon: Julieanne Cotton, MD;  Location: [facility] OR;  Service: Radiology;  Laterality: N/A;   Patient Active Problem List   Diagnosis Date Noted   Malnutrition of moderate degree 01/21/2023   Hyperkalemia 01/19/2023   Dehydration 01/19/2023   Acute prerenal azotemia 01/19/2023   Allergic rhinitis 01/19/2023   Chronic diastolic CHF (congestive heart failure) (HCC) 01/19/2023   AKI (acute kidney injury) (HCC) 01/18/2023   Chronic idiopathic constipation 08/31/2022   Chronic renal disease, stage 4, severely decreased glomerular filtration rate (GFR) between 15-29 mL/min/1.73 square meter (HCC) 06/08/2022   Chronic midline low back pain with bilateral sciatica 06/01/2022   Generalized weakness 03/29/2022   Multinodular goiter 11/02/2021   Encounter for general adult medical examination with abnormal findings 04/08/2021   Visit for screening mammogram 04/01/2020   Type 2 diabetes mellitus with complication, with long-term current use of insulin (HCC) 09/18/2018   Age-related osteoporosis with current pathological fracture 02/20/2018   Syncope 12/11/2016   SIRS (systemic inflammatory response syndrome) (HCC) 12/11/2016   Intractable migraine without aura and with status migrainosus  11/24/2016   GERD (gastroesophageal reflux disease) 03/08/2016   Major depressive disorder, recurrent episode (HCC) 02/20/2016   Occlusion and stenosis of carotid artery with cerebral infarction 11/25/2013   Depression 11/25/2013   Cerebrovascular accident (CVA) due to stenosis of left middle cerebral artery (HCC) 11/02/2013   Vitamin D deficiency 10/19/2013   Tobacco abuse 07/31/2013   Essential hypertension, benign 04/20/2013   DM2 (diabetes mellitus, type 2) (HCC) 04/20/2013   Acquired hypothyroidism 04/20/2013   Hyperlipidemia with target LDL less than 70 04/20/2013     ONSET DATE: 01-18-23  REFERRING DIAG:  Diagnosis  S09.90XA (ICD-10-CM) - Injury of head, initial encounter    THERAPY DIAG:  Decreased activity tolerance  Unsteadiness on feet  Other abnormalities of gait and mobility  Muscle weakness (generalized)  Rationale for Evaluation and Treatment: Rehabilitation  SUBJECTIVE:                                                                                                                                                                                             SUBJECTIVE STATEMENT: Pt went to ED on 01-18-23 due to head injury due to syncopal episode due to dehydration; pt was hospitalized until 01-21-23; pt has referral for OT for assist with medication management but states she does not feel this is needed at this time.  Pt says dizziness is no longer a problem - has resolved. Pt reports her main problem is "lack of energy" and decreased endurance Pt accompanied by:  sister in lobby  PERTINENT HISTORY: DM2, ischemic CVA 2015 with left-sided hemiparesis, chronic diastolic CHF admitted to [facility] from 6/4 due to AKI and hyperkalemia - pt reports she had syncopal episode due to dehydration and fell, hit her head  PAIN:  Are you having pain? No  PRECAUTIONS: None  WEIGHT BEARING RESTRICTIONS: No  FALLS: Has patient fallen in last 6 months? Yes - the one time on 01-18-23 when she passed out and fell  LIVING ENVIRONMENT: Lives with: lives alone Lives in: House/apartment Stairs: No Has following equipment at home: None  PLOF: Independent and except for driving  PATIENT GOALS: really don't have any goals - wants to improve energy; "sometimes I get off balance"  OBJECTIVE:   DIAGNOSTIC FINDINGS: IMPRESSION: 1. No acute intracranial abnormality. 2. Small contusion about the right forehead. No calvarial fracture.    COGNITION: Overall cognitive status: Within functional limits for tasks  assessed   SENSATION: WFL  COORDINATION: WNL's bil. LE's  POSTURE: No Significant postural limitations  LOWER EXTREMITY ROM:   WNL's bil. LE's   LOWER EXTREMITY MMT:  WNL's bil. LE's  BED MOBILITY:  Independent  TRANSFERS: Assistive device utilized: None  Sit to stand: Modified independence Stand to sit: Modified independence  GAIT: Gait pattern: WFL Distance walked: 37' Assistive device utilized: None Level of assistance: Complete Independence Comments: no deviations exhibited  FUNCTIONAL TESTS:  5 times sit to stand: 13.38 secs without UE support Timed up and go (TUG): 10.78 secs without device 10 meter walk test: 10.53 secs = 3.11 ft/sec no device  Romberg EO = 30 secs Tandem stance 7.63 secs with R foot in front;  unable to stand tandem with Lt foot in front SLS = unable to do on either leg  PATIENT SURVEYS:  N/A for pt's diagnosis  TODAY'S TREATMENT:                                                                                                                               Eval only    PATIENT EDUCATION: Education details: eval results and POC Person educated: Patient Education method: Explanation Education comprehension: verbalized understanding  HOME EXERCISE PROGRAM: To be established  GOALS: Goals reviewed with patient? Yes  SHORT TERM GOALS: Target date: 03-25-23= same as LTG's as ELOS = 4 weeks   LONG TERM GOALS: Target date: 03-25-23  Pt will demonstrate improved endurance/activity tolerance so she is able to amb. 10" nonstop without device. Baseline:  Goal status: INITIAL  2.  Increase gait velocity to >/= 3.5 ft/sec for increased gait efficiency. Baseline: 10.53 secs = 3.11 ft/sec without device Goal status: INITIAL  3.  Improve static standing balance so pt able to stand at least 2 secs on each leg for increased safety with stepping over objects and with step negotiation. Baseline: unable to perform SLS on either leg Goal status:  INITIAL  4.  Independent in HEP for endurance and balance.  Baseline:  Goal status: INITIAL   ASSESSMENT:  CLINICAL IMPRESSION: Patient is a 72 y.o. lady who was seen today for physical therapy evaluation and treatment for decreased mobility and activity tolerance due to head injury due to fall due to syncopal episode.  Pt presents with decreased high level balance skills with inability to perform tandem stance > 7.63 secs with Rt foot in front and unable to perform SLS on either leg.  TUG score of 10.78 secs is not indicative of fall.  Pt is amb. Independently without device. Chief c/o is decreased energy/activity tolerance.  Pt will benefit from PT to address endurance and balance deficits.    OBJECTIVE IMPAIRMENTS: decreased activity tolerance, decreased balance, and decreased endurance.   ACTIVITY LIMITATIONS: locomotion level  PARTICIPATION LIMITATIONS: interpersonal relationship, shopping, and community activity  PERSONAL FACTORS: 1 comorbidity: h/o CVA  are also affecting patient's functional outcome.   REHAB POTENTIAL: Good  CLINICAL DECISION MAKING: Stable/uncomplicated  EVALUATION COMPLEXITY: Low  PLAN:  PT FREQUENCY: 1x/week  PT DURATION: 4 weeks  PLANNED INTERVENTIONS: Therapeutic exercises, Therapeutic activity, Neuromuscular re-education, Balance training, Gait training, Patient/Family education, Self Care, and Stair  training  PLAN FOR NEXT SESSION: issue walking program - do Nustep for endurance training   Jermery Caratachea, Donavan Burnet, PT 02/16/2023, 3:04 PM

## 2023-02-15 NOTE — Patient Instructions (Signed)
SINGLE LIMB STANCE -do standing at counter    Stance: single leg on floor. Raise leg. Hold _10__ seconds. Repeat with other leg. __2_ reps per set, __2_ sets per day, _5__ days per week      Semi Tandem Standing - do standing at counter to hold as needed for balance    Heel of one foot against arch of other: hold for 30 secs - do each foot position Repeat __1-2__ times/day.        WALKING  Walking is a great form of exercise to increase your strength, endurance and overall fitness.  A walking program can help you start slowly and gradually build endurance as you go.  Everyone's ability is different, so each person's starting point will be different.  You do not have to follow them exactly.  The are just samples. You should simply find out what's right for you and stick to that program.   In the beginning, you'll start off walking 2-3 times a day for short distances.  As you get stronger, you'll be walking further at just 1-2 times per day.  A. You Can Walk For A Certain Length Of Time Each Day    Walk 5 minutes 3 times per day.  Increase 2 minutes every 2 days (3 times per day).  Work up to 25-30 minutes (1-2 times per day).   Example:   Day 1-2 5 minutes 3 times per day   Day 7-8 12 minutes 2-3 times per day   Day 13-14 25 minutes 1-2 times per day  B. You Can Walk For a Certain Distance Each Day     Distance can be substituted for time.    Example:   3 trips to mailbox (at road)   3 trips to corner of block   3 trips around the block  C. Go to local high school and use the track.      Or time 5 minutes to start - work up to 20-25"/day

## 2023-02-16 ENCOUNTER — Encounter: Payer: Self-pay | Admitting: Physical Therapy

## 2023-02-16 DIAGNOSIS — N1831 Chronic kidney disease, stage 3a: Secondary | ICD-10-CM | POA: Diagnosis not present

## 2023-02-16 DIAGNOSIS — I129 Hypertensive chronic kidney disease with stage 1 through stage 4 chronic kidney disease, or unspecified chronic kidney disease: Secondary | ICD-10-CM | POA: Diagnosis not present

## 2023-02-16 DIAGNOSIS — E1122 Type 2 diabetes mellitus with diabetic chronic kidney disease: Secondary | ICD-10-CM | POA: Diagnosis not present

## 2023-02-16 DIAGNOSIS — I639 Cerebral infarction, unspecified: Secondary | ICD-10-CM | POA: Diagnosis not present

## 2023-02-16 DIAGNOSIS — N179 Acute kidney failure, unspecified: Secondary | ICD-10-CM | POA: Diagnosis not present

## 2023-03-01 ENCOUNTER — Other Ambulatory Visit (HOSPITAL_COMMUNITY): Payer: Self-pay | Admitting: *Deleted

## 2023-03-01 DIAGNOSIS — F331 Major depressive disorder, recurrent, moderate: Secondary | ICD-10-CM

## 2023-03-01 MED ORDER — TRAZODONE HCL 50 MG PO TABS
50.0000 mg | ORAL_TABLET | Freq: Every evening | ORAL | 0 refills | Status: DC | PRN
Start: 2023-03-01 — End: 2023-03-16

## 2023-03-04 ENCOUNTER — Other Ambulatory Visit: Payer: Self-pay | Admitting: Internal Medicine

## 2023-03-04 DIAGNOSIS — E119 Type 2 diabetes mellitus without complications: Secondary | ICD-10-CM

## 2023-03-12 ENCOUNTER — Other Ambulatory Visit: Payer: Self-pay | Admitting: Internal Medicine

## 2023-03-12 DIAGNOSIS — G8929 Other chronic pain: Secondary | ICD-10-CM

## 2023-03-14 ENCOUNTER — Ambulatory Visit: Payer: 59 | Admitting: Physical Therapy

## 2023-03-14 ENCOUNTER — Encounter: Payer: Self-pay | Admitting: Physical Therapy

## 2023-03-14 DIAGNOSIS — M6281 Muscle weakness (generalized): Secondary | ICD-10-CM | POA: Diagnosis not present

## 2023-03-14 DIAGNOSIS — R6889 Other general symptoms and signs: Secondary | ICD-10-CM | POA: Diagnosis not present

## 2023-03-14 DIAGNOSIS — R2681 Unsteadiness on feet: Secondary | ICD-10-CM | POA: Diagnosis not present

## 2023-03-14 DIAGNOSIS — S0990XA Unspecified injury of head, initial encounter: Secondary | ICD-10-CM | POA: Diagnosis not present

## 2023-03-14 DIAGNOSIS — R2689 Other abnormalities of gait and mobility: Secondary | ICD-10-CM

## 2023-03-14 NOTE — Therapy (Signed)
OUTPATIENT PHYSICAL THERAPY NEURO TREATMENT NOTE   Patient Name: Sonia Skinner MRN: [id number] DOB:[birth year], 72 y.o., female Today's Date: 03/14/2023   PCP: Etta Grandchild., MD REFERRING PROVIDER: Dimple Nanas, MD  END OF SESSION:  PT End of Session - 03/14/23 1920     Visit Number 2    Number of Visits 5    Date for PT Re-Evaluation 03/25/23   pushed out due to schedule availability   Authorization Type [facility] Medicare    Authorization Time Period 02-15-23 - 04-18-23    PT Start Time 1105    PT Stop Time 1148    PT Time Calculation (min) 43 min    Activity Tolerance Patient tolerated treatment well    Behavior During Therapy [facility] for tasks assessed/performed              Past Medical History:  Diagnosis Date   Ankle fracture    Left   Anxiety    Carotid artery occlusion    Closed fracture of left distal fibula 09/16/2017   Complication of anesthesia    ALLERY TO ESTER BASE   Depression    early 30s   Diabetes mellitus    INSULIN DEPENDENT   GERD (gastroesophageal reflux disease)    Headache    years ago   Hypertension    Pneumonia    Stroke [facility]) March 2015    left MCA infarct, slight weakness on left side   Thyroid disease    Past Surgical History:  Procedure Laterality Date   ABDOMINAL HYSTERECTOMY     ANTERIOR FIXATION AND POSTERIOR MICRODISCECTOMY CERVICAL SPINE  1999   CAROTID ENDARTERECTOMY Left 11-04-13   cea   ENDARTERECTOMY Left 11/04/2013   IR ANGIO INTRA EXTRACRAN SEL COM CAROTID INNOMINATE BILAT MOD SED  11/16/2021   IR ANGIO INTRA EXTRACRAN SEL COM CAROTID INNOMINATE UNI L MOD SED  11/19/2021   IR ANGIO VERTEBRAL SEL VERTEBRAL BILAT MOD SED  11/16/2021   IR CT HEAD LTD  11/19/2021   IR INTRAVSC STENT CERV CAROTID W/EMB-PROT MOD SED INCL ANGIO  11/19/2021   IR RADIOLOGIST EVAL & MGMT  12/13/2021   ORIF ANKLE FRACTURE Left 09/16/2017   Procedure: OPEN REDUCTION INTERNAL FIXATION (ORIF) LEFT ANKLE FRACTURE;  Surgeon: Teryl Lucy, MD;  Location: [facility] OR;   Service: Orthopedics;  Laterality: Left;   RADIOLOGY WITH ANESTHESIA N/A 11/19/2021   Procedure: Left carotid angioplasty with possible stenting;  Surgeon: Julieanne Cotton, MD;  Location: [facility] OR;  Service: Radiology;  Laterality: N/A;   Patient Active Problem List   Diagnosis Date Noted   Malnutrition of moderate degree 01/21/2023   Hyperkalemia 01/19/2023   Dehydration 01/19/2023   Acute prerenal azotemia 01/19/2023   Allergic rhinitis 01/19/2023   Chronic diastolic CHF (congestive heart failure) (HCC) 01/19/2023   AKI (acute kidney injury) (HCC) 01/18/2023   Chronic idiopathic constipation 08/31/2022   Chronic renal disease, stage 4, severely decreased glomerular filtration rate (GFR) between 15-29 mL/min/1.73 square meter (HCC) 06/08/2022   Chronic midline low back pain with bilateral sciatica 06/01/2022   Generalized weakness 03/29/2022   Multinodular goiter 11/02/2021   Encounter for general adult medical examination with abnormal findings 04/08/2021   Visit for screening mammogram 04/01/2020   Type 2 diabetes mellitus with complication, with long-term current use of insulin (HCC) 09/18/2018   Age-related osteoporosis with current pathological fracture 02/20/2018   Syncope 12/11/2016   SIRS (systemic inflammatory response syndrome) (HCC) 12/11/2016   Intractable migraine without aura and with  status migrainosus 11/24/2016   GERD (gastroesophageal reflux disease) 03/08/2016   Major depressive disorder, recurrent episode (HCC) 02/20/2016   Occlusion and stenosis of carotid artery with cerebral infarction 11/25/2013   Depression 11/25/2013   Cerebrovascular accident (CVA) due to stenosis of left middle cerebral artery (HCC) 11/02/2013   Vitamin D deficiency 10/19/2013   Tobacco abuse 07/31/2013   Essential hypertension, benign 04/20/2013   DM2 (diabetes mellitus, type 2) (HCC) 04/20/2013   Acquired hypothyroidism 04/20/2013   Hyperlipidemia with target LDL less than 70 04/20/2013     ONSET DATE: 01-18-23  REFERRING DIAG:  Diagnosis  S09.90XA (ICD-10-CM) - Injury of head, initial encounter    THERAPY DIAG:  Other abnormalities of gait and mobility  Muscle weakness (generalized)  Rationale for Evaluation and Treatment: Rehabilitation  SUBJECTIVE:                                                                                                                                                                                             SUBJECTIVE STATEMENT: Pt reports she hasn't been walking very much - hasn't started a walking program yet - states she just can't get motivated to start Pt accompanied by:  sister in lobby  PERTINENT HISTORY: DM2, ischemic CVA 2015 with left-sided hemiparesis, chronic diastolic CHF admitted to [facility] from 6/4 due to AKI and hyperkalemia - pt reports she had syncopal episode due to dehydration and fell, hit her head  PAIN:  Are you having pain? No  PRECAUTIONS: None  WEIGHT BEARING RESTRICTIONS: No  FALLS: Has patient fallen in last 6 months? Yes - the one time on 01-18-23 when she passed out and fell  LIVING ENVIRONMENT: Lives with: lives alone Lives in: House/apartment Stairs: No Has following equipment at home: None  PLOF: Independent and except for driving  PATIENT GOALS: really don't have any goals - wants to improve energy; "sometimes I get off balance"   TODAY'S TREATMENT:         GAIT: Gait pattern: WFL Distance walked: 400' Assistive device utilized: None Level of assistance: supervision Comments: cues to stand erect and to increase arm swing  Step training - pt negotiated 4 steps 3 reps (12 steps total) with use of bil. Hand rails using step over step sequence with SBA   TODAY'S TREATMENT:  TherEx:  Step up exercise RLE and LLE onto 6" step 10 reps each leg with UE support on  rails Leg press 50# bil. LE's 3 sets 10 reps Pt performed standing PRE's for bil. Hip musc. - red theraband used for standing hip flexion, extension and abduction 15 reps LLE;  pt performed hip abduction and extension RLE 15 reps and c/o onset of low back pain so exercise was discontinued with pt requesting seated rest break  Standing Rt hip flexion with knee flexed with use of red theraband 10 reps in standing  Sidestepping 20' x 2 reps with red theraband for hip abdct. strengthening  5x sit to stand with feet on Airex - from high/low mat table  without UE support   PATIENT EDUCATION: Education details: Medbridge HEP; pt also given walking program handout from pt instructions and given red theraband for strengthening Person educated: Patient Education method: Explanation, demonstration, handout Education comprehension: verbalized understanding and demonstrated understanding  HOME EXERCISE PROGRAM:  Medbridge Access Code: [email or web address] URL: [email or web address] Date: 03/14/2023 Prepared by: Maebelle Munroe  Exercises - Standing Hip Flexion with Resistance Loop  - 1 x daily - 7 x weekly - 1 sets - 10 reps - Hip and Knee Flexion with Anchored Resistance  - 1 x daily - 7 x weekly - 1 sets - 10 reps - Standing Hip Abduction with Resistance at Ankles and Counter Support  - 1 x daily - 7 x weekly - 1 sets - 10 reps - Standing Hip Extension with Resistance at Ankles and Counter Support  - 1 x daily - 7 x weekly - 1 sets - 10 reps  GOALS: Goals reviewed with patient? Yes  SHORT TERM GOALS: Target date: 03-25-23= same as LTG's as ELOS = 4 weeks   LONG TERM GOALS: Target date: 03-25-23  Pt will demonstrate improved endurance/activity tolerance so she is able to amb. 10" nonstop without device. Baseline:  Goal status: INITIAL  2.  Increase gait velocity to >/= 3.5 ft/sec for increased gait efficiency. Baseline: 10.53 secs = 3.11 ft/sec without device Goal status: INITIAL  3.   Improve static standing balance so pt able to stand at least 2 secs on each leg for increased safety with stepping over objects and with step negotiation. Baseline: unable to perform SLS on either leg Goal status: INITIAL  4.  Independent in HEP for endurance and balance.  Baseline:  Goal status: INITIAL   ASSESSMENT:  CLINICAL IMPRESSION: PT session focused on strengthening and endurance exercises with HEP being established including handout for walking program.  Pt able to perform approx. 35" exercises, majority of exercises in standing position, prior to report of onset of LBP.  Pt able to take short seated rest break and continue exercises.  Pt reports no major balance problems.  Pt's gait pattern improved with cues to stand erect and to increase arm swing.  Cont with POC   OBJECTIVE IMPAIRMENTS: decreased activity tolerance, decreased balance, and decreased endurance.   ACTIVITY LIMITATIONS: locomotion level  PARTICIPATION LIMITATIONS: interpersonal relationship, shopping, and community activity  PERSONAL FACTORS: 1 comorbidity: h/o CVA  are also affecting patient's functional outcome.   REHAB POTENTIAL: Good  CLINICAL DECISION MAKING: Stable/uncomplicated  EVALUATION COMPLEXITY: Low  PLAN:  PT FREQUENCY: 1x/week  PT DURATION: 4 weeks  PLANNED INTERVENTIONS: Therapeutic exercises, Therapeutic activity, Neuromuscular re-education, Balance training, Gait training, Patient/Family education, Self Care, and Stair training  PLAN FOR NEXT SESSION: check HEP - cont therex; endurance training   Miguel Christiana, Bonita Quin  Rosalita Chessman, PT 03/14/2023, 7:22 PM

## 2023-03-14 NOTE — Patient Instructions (Signed)
WALKING  Walking is a great form of exercise to increase your strength, endurance and overall fitness.  A walking program can help you start slowly and gradually build endurance as you go.  Everyone's ability is different, so each person's starting point will be different.  You do not have to follow them exactly.  The are just samples. You should simply find out what's right for you and stick to that program.   In the beginning, you'll start off walking 2-3 times a day for short distances.  As you get stronger, you'll be walking further at just 1-2 times per day.  A. You Can Walk For A Certain Length Of Time Each Day    Walk 5 minutes 3 times per day.  Increase 2 minutes every 2 days (3 times per day).  Work up to 25-30 minutes (1-2 times per day).   Example:   Day 1-2 5 minutes 3 times per day   Day 7-8 12 minutes 2-3 times per day   Day 13-14 25 minutes 1-2 times per day  B. You Can Walk For a Certain Distance Each Day     Distance can be substituted for time.    Example:   3 trips to mailbox (at road)   3 trips to corner of block   3 trips around the block  C. Go to local [employer] and use the track.    Walk for distance - 3-5 times around the cul[place]de-sac  Or time 10 minutes  D. Walk 10 minutes per day - increase to 2x/day    Please only do the exercises that your therapist has initialed and dated

## 2023-03-16 ENCOUNTER — Telehealth (HOSPITAL_BASED_OUTPATIENT_CLINIC_OR_DEPARTMENT_OTHER): Payer: 59 | Admitting: Psychiatry

## 2023-03-16 ENCOUNTER — Other Ambulatory Visit: Payer: Self-pay | Admitting: Internal Medicine

## 2023-03-16 ENCOUNTER — Encounter (HOSPITAL_COMMUNITY): Payer: Self-pay | Admitting: Psychiatry

## 2023-03-16 VITALS — Wt 115.0 lb

## 2023-03-16 DIAGNOSIS — F411 Generalized anxiety disorder: Secondary | ICD-10-CM

## 2023-03-16 DIAGNOSIS — E118 Type 2 diabetes mellitus with unspecified complications: Secondary | ICD-10-CM

## 2023-03-16 DIAGNOSIS — F331 Major depressive disorder, recurrent, moderate: Secondary | ICD-10-CM

## 2023-03-16 DIAGNOSIS — R413 Other amnesia: Secondary | ICD-10-CM | POA: Diagnosis not present

## 2023-03-16 DIAGNOSIS — R251 Tremor, unspecified: Secondary | ICD-10-CM

## 2023-03-16 DIAGNOSIS — I10 Essential (primary) hypertension: Secondary | ICD-10-CM

## 2023-03-16 MED ORDER — TRAZODONE HCL 50 MG PO TABS
50.0000 mg | ORAL_TABLET | Freq: Every evening | ORAL | 0 refills | Status: DC | PRN
Start: 2023-03-16 — End: 2023-07-01

## 2023-03-16 MED ORDER — BENZTROPINE MESYLATE 0.5 MG PO TABS
0.5000 mg | ORAL_TABLET | Freq: Every day | ORAL | 0 refills | Status: DC
Start: 2023-03-16 — End: 2023-07-01

## 2023-03-16 MED ORDER — BUPROPION HCL ER (XL) 300 MG PO TB24
300.0000 mg | ORAL_TABLET | Freq: Every day | ORAL | 0 refills | Status: DC
Start: 2023-03-16 — End: 2023-07-01

## 2023-03-16 MED ORDER — BREXPIPRAZOLE 1 MG PO TABS: 1.0000 mg | ORAL_TABLET | Freq: Every day | ORAL | 0 refills | Status: DC

## 2023-03-16 NOTE — Progress Notes (Addendum)
 [facility] MD Virtual Progress Note   Patient Location: Home Provider Location: Home Office  I connect with patient by video and verified that I am speaking with correct person by using two identifiers. I discussed the limitations of evaluation and management by telemedicine and the availability of in person appointments. I also discussed with the patient that there may be a patient responsible charge related to this service. The patient expressed understanding and agreed to proceed.  Sonia Skinner [id number] 72 y.o.  03/16/2023 10:29 AM  History of Present Illness:  Patient is evaluated by phone session.  She is not interested in video session.  In the past when she had a video session she was keep changing the position of camera to avoid being seen.  She reported chronic medical symptoms as fatigue, tiredness, chronic back pain.  Recently she was in the hospital because of nausea and vomiting.  Her creatinine was very high.  She has not established care with a nephrology but saw primary care nurse practitioner and now she will see Dr. Yetta Barre in November.  We have cut down the REXULTI and Cogentin because of memory issues.  She noticed some improvement but is still difficulty remembering and have short-term problems.  Her tremors are stable.  She denies any hallucination, paranoia or any suicidal thoughts.  She gets easily sensitive when question asked about her symptoms.  She tend to be reserved about her symptoms.  She is upset and is not able to take the Ozempic because of the side effects.  Now she is in a process of medication adjustment for her diabetes.  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  Her husband has chronic symptoms.  She lives with her husband.  She admitted limited support from her husband.  Her husband had a stroke and she takes..  Patient is not interested in therapy.  Past Psychiatric History: H/O  taking antidepressant on and off most of life. H/O overdose and inpatient at [facility]. Saw provider at [facility] and given the Strattera, Adderall, Zoloft, Celexa, Lamictal and Lexapro.  Never tested for ADHD. No h/o psychosis, mania or hallucination.  In 2001 moved to [facility] and given amitriptyline and Wellbutrin to stop smoking. Prozac and Paxil made tired and nausea.     Outpatient Encounter Medications as of 03/16/2023  Medication Sig   Accu-Chek FastClix Lancets MISC Use to check blood sugar five times daily.   ACCU-CHEK GUIDE test strip Use to check blood sugar twice a day. E11.9   amLODipine (NORVASC) 10 MG tablet Take 1 tablet (10 mg total) by mouth daily. (Patient taking differently: Take 5 mg by mouth daily.)   aspirin 81 MG chewable tablet Chew 1 tablet (81 mg total) by mouth daily.   atorvastatin (LIPITOR) 40 MG tablet Take 1 tablet by mouth daily.   benztropine (COGENTIN) 0.5 MG tablet Take 1 tablet (0.5 mg total) by mouth at bedtime.   brexpiprazole (REXULTI) 1 MG TABS tablet Take 1 tablet (1 mg total) by mouth daily.   buPROPion (WELLBUTRIN XL) 300 MG 24 hr tablet Take 1 tablet (300 mg total) by mouth daily.   Cholecalciferol (VITAMIN D3 PO) Take 1 tablet by mouth daily. Unsure of dose   Continuous Blood Gluc Receiver (DEXCOM G7 RECEIVER) DEVI 1 Act by Does not apply route daily.   Continuous Blood Gluc Sensor (DEXCOM G7 SENSOR) MISC  1 Act by Does not apply route daily.   Continuous Blood Gluc Sensor (DEXCOM G7 SENSOR) MISC 1 Act by Does not apply route daily.   Continuous Blood Gluc Sensor (DEXCOM G7 SENSOR) MISC USE AS DIRECTED TOPICALLY  EVERY  10  DAYS   dexlansoprazole (DEXILANT) 60 MG capsule Take 1 capsule by mouth daily.   EDARBYCLOR 40-12.5 MG TABS Take 1 tablet by mouth daily.   ezetimibe (ZETIA) 10 MG tablet Take 1 tablet by mouth once daily.   gabapentin (NEURONTIN) 100 MG capsule Take 1 capsule (100 mg total) by mouth 3 (three) times daily.    HUMALOG KWIKPEN 200 UNIT/ML KwikPen Inject 5 units under the skin three times daily with meals.   HYDROcodone-acetaminophen (NORCO) 5-325 MG tablet Take 1 tablet by mouth every 6 (six) hours as needed for moderate pain.   insulin glargine, 1 Unit Dial, (TOUJEO SOLOSTAR) 300 UNIT/ML Solostar Pen Inject 20-22 Units into the skin daily.   levothyroxine (SYNTHROID) 88 MCG tablet Take 1 tablet (88 mcg total) by mouth daily.   loperamide (IMODIUM) 2 MG capsule Take 1 capsule (2 mg total) by mouth 4 (four) times daily as needed for diarrhea or loose stools.   loratadine (CLARITIN) 10 MG tablet Take 1 tablet (10 mg total) by mouth daily.   lubiprostone (AMITIZA) 24 MCG capsule Take 1 capsule (24 mcg total) by mouth 2 (two) times daily with a meal.   traZODone (DESYREL) 50 MG tablet Take 1 tablet (50 mg total) by mouth at bedtime as needed for sleep.   No facility-administered encounter medications on file as of 03/16/2023.    Recent Results (from the past 2160 hour(s))  POCT glycosylated hemoglobin (Hb A1C)     Status: Abnormal   Collection Time: 12/31/22  3:29 PM  Result Value Ref Range   Hemoglobin A1C 8.1 (A) 4.0 - 5.6 %   HbA1c POC (<> result, manual entry)     HbA1c, POC (prediabetic range)     HbA1c, POC (controlled diabetic range)    CBG monitoring, ED     Status: Abnormal   Collection Time: 01/18/23  6:12 PM  Result Value Ref Range   Glucose-Capillary 156 (H) 70 - 99 mg/dL    Comment: Glucose reference range applies only to samples taken after fasting for at least 8 hours.  Basic metabolic panel     Status: Abnormal   Collection Time: 01/18/23  6:45 PM  Result Value Ref Range   Sodium 138 135 - 145 mmol/L   Potassium 5.5 (H) 3.5 - 5.1 mmol/L   Chloride 109 98 - 111 mmol/L   CO2 16 (L) 22 - 32 mmol/L   Glucose, Bld 169 (H) 70 - 99 mg/dL    Comment: Glucose reference range applies only to samples taken after fasting for at least 8 hours.   BUN 55 (H) 8 - 23 mg/dL   Creatinine, Ser  4.09 (H) 0.44 - 1.00 mg/dL   Calcium 9.5 8.9 - 81.1 mg/dL   GFR, Estimated 20 (L) >60 mL/min    Comment: (NOTE) Calculated using the CKD-EPI Creatinine Equation (2021)    Anion gap 13 5 - 15    Comment: Performed at [facility], [address] [place], [place] [address]  CBC     Status: Abnormal   Collection Time: 01/18/23  6:45 PM  Result Value Ref Range   WBC 12.9 (H) 4.0 - 10.5 K/uL   RBC 4.62 3.87 - 5.11 MIL/uL  Hemoglobin 12.3 12.0 - 15.0 g/dL   HCT 16.1 09.6 - 04.5 %   MCV 87.2 80.0 - 100.0 fL   MCH 26.6 26.0 - 34.0 pg   MCHC 30.5 30.0 - 36.0 g/dL   RDW 40.9 81.1 - 91.4 %   Platelets 191 150 - 400 K/uL   nRBC 0.0 0.0 - 0.2 %    Comment: Performed at [facility], [address] [place], [place] [address]  Magnesium     Status: None   Collection Time: 01/18/23 10:16 PM  Result Value Ref Range   Magnesium 2.0 1.7 - 2.4 mg/dL    Comment: Performed at [facility], [address] [place], [place] [address]  Urinalysis, Routine w reflex microscopic -Urine, Clean Catch     Status: Abnormal   Collection Time: 01/18/23 11:10 PM  Result Value Ref Range   Color, Urine YELLOW YELLOW   APPearance CLEAR CLEAR   Specific Gravity, Urine >=1.030 1.005 - 1.030   pH 5.5 5.0 - 8.0   Glucose, UA 100 (A) NEGATIVE mg/dL   Hgb urine dipstick NEGATIVE NEGATIVE   Bilirubin Urine NEGATIVE NEGATIVE   Ketones, ur NEGATIVE NEGATIVE mg/dL   Protein, ur NEGATIVE NEGATIVE mg/dL   Nitrite NEGATIVE NEGATIVE   Leukocytes,Ua TRACE (A) NEGATIVE    Comment: Performed at [facility], [address] [place], [place] [address]  Urinalysis, Microscopic (reflex)     Status: Abnormal   Collection Time: 01/18/23 11:10 PM  Result Value Ref Range   RBC / HPF NONE SEEN 0 - 5 RBC/hpf   WBC, UA 0-5 0 - 5 WBC/hpf   Bacteria, UA RARE (A) NONE SEEN   Squamous Epithelial / HPF 0-5 0 - 5 /HPF    Comment: Performed at [facility], [address]  [place], [place] [address]  Glucose, capillary     Status: None   Collection Time: 01/19/23  7:49 AM  Result Value Ref Range   Glucose-Capillary 80 70 - 99 mg/dL    Comment: Glucose reference range applies only to samples taken after fasting for at least 8 hours.  Basic metabolic panel     Status: Abnormal   Collection Time: 01/19/23  7:59 AM  Result Value Ref Range   Sodium 138 135 - 145 mmol/L   Potassium 3.7 3.5 - 5.1 mmol/L   Chloride 106 98 - 111 mmol/L   CO2 17 (L) 22 - 32 mmol/L   Glucose, Bld 85 70 - 99 mg/dL    Comment: Glucose reference range applies only to samples taken after fasting for at least 8 hours.   BUN 49 (H) 8 - 23 mg/dL   Creatinine, Ser 9.52 (H) 0.44 - 1.00 mg/dL   Calcium 9.0 8.9 - 84.1 mg/dL   GFR, Estimated 25 (L) >60 mL/min    Comment: (NOTE) Calculated using the CKD-EPI Creatinine Equation (2021)    Anion gap 15 5 - 15    Comment: Performed at [facility], [address] [place], [place] [address]  CBC with Differential/Platelet     Status: Abnormal   Collection Time: 01/19/23  7:59 AM  Result Value Ref Range   WBC 8.8 4.0 - 10.5 K/uL   RBC 3.56 (L) 3.87 - 5.11 MIL/uL   Hemoglobin 9.5 (L) 12.0 - 15.0 g/dL   HCT 10.2 (L) 72.5 - 36.6 %   MCV 86.5 80.0 - 100.0 fL   MCH 26.7  26.0 - 34.0 pg   MCHC 30.8 30.0 - 36.0 g/dL   RDW 04.5 40.9 - 81.1 %   Platelets 182 150 - 400 K/uL   nRBC 0.0 0.0 - 0.2 %   Neutrophils Relative % 44 %   Neutro Abs 3.9 1.7 - 7.7 K/uL   Lymphocytes Relative 44 %   Lymphs Abs 3.9 0.7 - 4.0 K/uL   Monocytes Relative 8 %   Monocytes Absolute 0.7 0.1 - 1.0 K/uL   Eosinophils Relative 3 %   Eosinophils Absolute 0.2 0.0 - 0.5 K/uL   Basophils Relative 1 %   Basophils Absolute 0.1 0.0 - 0.1 K/uL   Immature Granulocytes 0 %   Abs Immature Granulocytes 0.01 0.00 - 0.07 K/uL    Comment: Performed at [facility], [address] [place], [place] [address]  Magnesium     Status: None   Collection Time: 01/19/23  7:59 AM   Result Value Ref Range   Magnesium 1.8 1.7 - 2.4 mg/dL    Comment: Performed at [facility], [address] [place], [place] [address]  Phosphorus     Status: None   Collection Time: 01/19/23  7:59 AM  Result Value Ref Range   Phosphorus 3.9 2.5 - 4.6 mg/dL    Comment: Performed at [facility], [address] [place], [place] [address]  CK     Status: None   Collection Time: 01/19/23  7:59 AM  Result Value Ref Range   Total CK 41 38 - 234 U/L    Comment: Performed at [facility], [address], [place], [place] [address]  Vitamin B12     Status: Abnormal   Collection Time: 01/19/23  7:59 AM  Result Value Ref Range   Vitamin B-12 982 (H) 180 - 914 pg/mL    Comment: (NOTE) This assay is not validated for testing neonatal or myeloproliferative syndrome specimens for Vitamin B12 levels. Performed at [facility], [address], [place], [place] [address]   TSH     Status: None   Collection Time: 01/19/23  7:59 AM  Result Value Ref Range   TSH 0.368 0.350 - 4.500 uIU/mL    Comment: Performed by a 3rd Generation assay with a functional sensitivity of <=0.01 uIU/mL. Performed at [facility], [address] [place], [place] [address]   Glucose, capillary     Status: Abnormal   Collection Time: 01/19/23  9:25 AM  Result Value Ref Range   Glucose-Capillary 109 (H) 70 - 99 mg/dL    Comment: Glucose reference range applies only to samples taken after fasting for at least 8 hours.  Glucose, capillary     Status: Abnormal   Collection Time: 01/19/23 11:46 AM  Result Value Ref Range   Glucose-Capillary 172 (H) 70 - 99 mg/dL    Comment: Glucose reference range applies only to samples taken after fasting for at least 8 hours.  Glucose, capillary     Status: Abnormal   Collection Time: 01/19/23  6:37 PM  Result Value Ref Range   Glucose-Capillary 219 (H) 70 - 99 mg/dL    Comment: Glucose reference range applies only to samples taken after fasting for  at least 8 hours.  Glucose, capillary     Status: Abnormal   Collection Time: 01/19/23  8:37 PM  Result Value Ref Range   Glucose-Capillary 204 (H) 70 - 99 mg/dL    Comment: Glucose reference range  applies only to samples taken after fasting for at least 8 hours.  Basic metabolic panel     Status: Abnormal   Collection Time: 01/20/23  7:37 AM  Result Value Ref Range   Sodium 139 135 - 145 mmol/L   Potassium 3.6 3.5 - 5.1 mmol/L   Chloride 112 (H) 98 - 111 mmol/L   CO2 20 (L) 22 - 32 mmol/L   Glucose, Bld 110 (H) 70 - 99 mg/dL    Comment: Glucose reference range applies only to samples taken after fasting for at least 8 hours.   BUN 33 (H) 8 - 23 mg/dL   Creatinine, Ser 8.29 (H) 0.44 - 1.00 mg/dL   Calcium 8.8 (L) 8.9 - 10.3 mg/dL   GFR, Estimated 36 (L) >60 mL/min    Comment: (NOTE) Calculated using the CKD-EPI Creatinine Equation (2021)    Anion gap 7 5 - 15    Comment: Performed at [facility], [address] [place], [place] [address]  CBC     Status: Abnormal   Collection Time: 01/20/23  7:37 AM  Result Value Ref Range   WBC 8.5 4.0 - 10.5 K/uL   RBC 3.44 (L) 3.87 - 5.11 MIL/uL   Hemoglobin 9.1 (L) 12.0 - 15.0 g/dL   HCT 08.6 (L) 57.8 - 46.9 %   MCV 85.5 80.0 - 100.0 fL   MCH 26.5 26.0 - 34.0 pg   MCHC 31.0 30.0 - 36.0 g/dL   RDW 62.9 52.8 - 41.3 %   Platelets 162 150 - 400 K/uL   nRBC 0.0 0.0 - 0.2 %    Comment: Performed at [facility], [address], [place], [place] [address]  Magnesium     Status: None   Collection Time: 01/20/23  7:37 AM  Result Value Ref Range   Magnesium 1.7 1.7 - 2.4 mg/dL    Comment: Performed at [facility], [address], [place], [place] [address]  Glucose, capillary     Status: Abnormal   Collection Time: 01/20/23  9:04 AM  Result Value Ref Range   Glucose-Capillary 136 (H) 70 - 99 mg/dL    Comment: Glucose reference range applies only to samples taken after fasting for at least 8 hours.  Glucose, capillary      Status: Abnormal   Collection Time: 01/20/23 12:22 PM  Result Value Ref Range   Glucose-Capillary 141 (H) 70 - 99 mg/dL    Comment: Glucose reference range applies only to samples taken after fasting for at least 8 hours.  Glucose, capillary     Status: Abnormal   Collection Time: 01/20/23  5:08 PM  Result Value Ref Range   Glucose-Capillary 228 (H) 70 - 99 mg/dL    Comment: Glucose reference range applies only to samples taken after fasting for at least 8 hours.  Glucose, capillary     Status: Abnormal   Collection Time: 01/20/23  8:41 PM  Result Value Ref Range   Glucose-Capillary 204 (H) 70 - 99 mg/dL    Comment: Glucose reference range applies only to samples taken after fasting for at least 8 hours.  Glucose, capillary     Status: None   Collection Time: 01/21/23  8:13 AM  Result Value Ref Range   Glucose-Capillary 92 70 - 99 mg/dL    Comment: Glucose reference range applies only to samples taken after fasting for at least 8 hours.  Basic metabolic panel     Status: Abnormal  Collection Time: 01/21/23 10:13 AM  Result Value Ref Range   Sodium 140 135 - 145 mmol/L   Potassium 3.8 3.5 - 5.1 mmol/L   Chloride 112 (H) 98 - 111 mmol/L   CO2 20 (L) 22 - 32 mmol/L   Glucose, Bld 173 (H) 70 - 99 mg/dL    Comment: Glucose reference range applies only to samples taken after fasting for at least 8 hours.   BUN 16 8 - 23 mg/dL   Creatinine, Ser 9.14 (H) 0.44 - 1.00 mg/dL   Calcium 9.2 8.9 - 78.2 mg/dL   GFR, Estimated 40 (L) >60 mL/min    Comment: (NOTE) Calculated using the CKD-EPI Creatinine Equation (2021)    Anion gap 8 5 - 15    Comment: Performed at [facility], [address], [place], [place] [address]  CBC     Status: Abnormal   Collection Time: 01/21/23 10:13 AM  Result Value Ref Range   WBC 7.8 4.0 - 10.5 K/uL   RBC 3.58 (L) 3.87 - 5.11 MIL/uL   Hemoglobin 9.4 (L) 12.0 - 15.0 g/dL   HCT 30.8 (L) 65.7 - 84.6 %   MCV 86.6 80.0 - 100.0 fL   MCH 26.3 26.0 - 34.0  pg   MCHC 30.3 30.0 - 36.0 g/dL   RDW 96.2 95.2 - 84.1 %   Platelets 169 150 - 400 K/uL   nRBC 0.0 0.0 - 0.2 %    Comment: Performed at [facility], [address] [place], [place] [address]  Magnesium     Status: Abnormal   Collection Time: 01/21/23 10:13 AM  Result Value Ref Range   Magnesium 1.5 (L) 1.7 - 2.4 mg/dL    Comment: Performed at [facility], [address], [place], [place] [address]  POCT glucose (manual entry)     Status: Abnormal   Collection Time: 01/27/23 11:40 AM  Result Value Ref Range   POC Glucose 160 (A) 70 - 99 mg/dl  Magnesium     Status: None   Collection Time: 01/27/23 11:49 AM  Result Value Ref Range   Magnesium 1.6 1.5 - 2.5 mg/dL  CBC with Differential/Platelet     Status: Abnormal   Collection Time: 01/27/23 11:49 AM  Result Value Ref Range   WBC 9.6 4.0 - 10.5 K/uL   RBC 4.28 3.87 - 5.11 Mil/uL   Hemoglobin 11.4 (L) 12.0 - 15.0 g/dL   HCT 53.6 64.4 - 03.4 %   MCV 85.8 78.0 - 100.0 fl   MCHC 31.0 30.0 - 36.0 g/dL   RDW 74.2 (H) 59.5 - 63.8 %   Platelets 219.0 150.0 - 400.0 K/uL   Neutrophils Relative % 52.6 43.0 - 77.0 %   Lymphocytes Relative 37.4 12.0 - 46.0 %   Monocytes Relative 7.2 3.0 - 12.0 %   Eosinophils Relative 2.2 0.0 - 5.0 %   Basophils Relative 0.6 0.0 - 3.0 %   Neutro Abs 5.0 1.4 - 7.7 K/uL   Lymphs Abs 3.6 0.7 - 4.0 K/uL   Monocytes Absolute 0.7 0.1 - 1.0 K/uL   Eosinophils Absolute 0.2 0.0 - 0.7 K/uL   Basophils Absolute 0.1 0.0 - 0.1 K/uL  Basic metabolic panel     Status: Abnormal   Collection Time: 01/27/23 11:49 AM  Result Value Ref Range   Sodium 139 135 - 145 mEq/L   Potassium 4.3 3.5 - 5.1 mEq/L   Chloride 102 96 - 112 mEq/L  CO2 27 19 - 32 mEq/L   Glucose, Bld 161 (H) 70 - 99 mg/dL   BUN 21 6 - 23 mg/dL   Creatinine, Ser 1.61 (H) 0.40 - 1.20 mg/dL   GFR 09.60 (L) >45.40 mL/min    Comment: Calculated using the CKD-EPI Creatinine Equation (2021)   Calcium 9.8 8.4 - 10.5 mg/dL     Psychiatric  Specialty Exam: Physical Exam  Review of Systems  Constitutional:  Positive for fatigue.  Musculoskeletal:  Positive for back pain.    Weight 115 lb (52.2 kg).There is no height or weight on file to calculate BMI.  General Appearance: NA  Eye Contact:  NA  Speech:  Slow  Volume:  Decreased  Mood:  Anxious and Dysphoric  Affect:  NA  Thought Process:  Descriptions of Associations: Intact  Orientation:  Full (Time, Place, and Person)  Thought Content:  Rumination  Suicidal Thoughts:  No  Homicidal Thoughts:  No  Memory:  Immediate;   Fair Recent;   Fair Remote;   Fair  Judgement:  Fair  Insight:  Shallow  Psychomotor Activity:  Decreased  Concentration:  Concentration: Fair and Attention Span: Fair  Recall:  [employer] of Knowledge:  Fair  Language:  Fair  Akathisia:  No  Handed:  Right  AIMS (if indicated):     Assets:  Communication Skills Desire for Improvement Housing Transportation  ADL's:  Intact  Cognition:  Impaired,  Mild  Sleep:  12 hrs     Assessment/Plan: Moderate episode of recurrent major depressive disorder (HCC) - Plan: traZODone (DESYREL) 50 MG tablet, brexpiprazole (REXULTI) 1 MG TABS tablet, buPROPion (WELLBUTRIN XL) 300 MG 24 hr tablet, benztropine (COGENTIN) 0.5 MG tablet  GAD (generalized anxiety disorder) - Plan: brexpiprazole (REXULTI) 1 MG TABS tablet, buPROPion (WELLBUTRIN XL) 300 MG 24 hr tablet  Memory changes - Plan: buPROPion (WELLBUTRIN XL) 300 MG 24 hr tablet  Tremor - Plan: benztropine (COGENTIN) 0.5 MG tablet  I reviewed blood work results.  Current medication.  She was recently admitted to Hospital for hypokalemia, nausea or vomiting.  Creatinine was 2.3.  She is in a process of seeing primary care and nephrology.  She is no longer taking Ozempic.  She does not want to change her psychotropic medication.  She is not interested in therapy.  Continue REXULTI 1 mg at bedtime, trazodone 50 mg at bedtime, Cogentin 0.5 mg daily and  Wellbutrin XL 300 mg daily.  We may need to consider any person visit in the future if patient refused to do video session.  Patient like to send all of her medication to pill pack.  Follow-up in 3 months.   Follow Up Instructions:     I discussed the assessment and treatment plan with the patient. The patient was provided an opportunity to ask questions and all were answered. The patient agreed with the plan and demonstrated an understanding of the instructions.   The patient was advised to call back or seek an in-person evaluation if the symptoms worsen or if the condition fails to improve as anticipated.    Collaboration of Care: Other provider involved in patient's care AEB notes are available in epic to review.  Patient/Guardian was advised Release of Information must be obtained prior to any record release in order to collaborate their care with an outside provider. Patient/Guardian was advised if they have not already done so to contact the registration department to sign all necessary forms in order for [place] to release information  regarding their care.   Consent: Patient/Guardian gives verbal consent for treatment and assignment of benefits for services provided during this visit. Patient/Guardian expressed understanding and agreed to proceed.     I provided 21 minutes of non face to face time during this encounter.  Note: This document was prepared by [employer] voice dictation technology and any errors that results from this process are unintentional.    Cleotis Nipper, MD 03/16/2023

## 2023-03-21 ENCOUNTER — Ambulatory Visit: Payer: 59 | Attending: Internal Medicine | Admitting: Physical Therapy

## 2023-03-21 DIAGNOSIS — R2681 Unsteadiness on feet: Secondary | ICD-10-CM | POA: Diagnosis not present

## 2023-03-21 DIAGNOSIS — M6281 Muscle weakness (generalized): Secondary | ICD-10-CM | POA: Diagnosis not present

## 2023-03-21 DIAGNOSIS — R2689 Other abnormalities of gait and mobility: Secondary | ICD-10-CM | POA: Insufficient documentation

## 2023-03-21 NOTE — Therapy (Unsigned)
OUTPATIENT PHYSICAL THERAPY NEURO TREATMENT NOTE   Patient Name: Sonia Skinner MRN: [id number] DOB:08/29/1950, 72 y.o., female Today's Date: 03/22/2023   PCP: Etta Grandchild., MD REFERRING PROVIDER: Dimple Nanas, MD  END OF SESSION:  PT End of Session - 03/22/23 1840     Visit Number 3    Number of Visits 5    Date for PT Re-Evaluation 03/25/23   pushed out due to schedule availability   Authorization Type [facility] Medicare    Authorization Time Period 02-15-23 - 04-18-23    PT Start Time 1103    PT Stop Time 1146    PT Time Calculation (min) 43 min    Activity Tolerance Patient tolerated treatment well    Behavior During Therapy [facility] for tasks assessed/performed               Past Medical History:  Diagnosis Date   Ankle fracture    Left   Anxiety    Carotid artery occlusion    Closed fracture of left distal fibula 09/16/2017   Complication of anesthesia    ALLERY TO ESTER BASE   Depression    early 30s   Diabetes mellitus    INSULIN DEPENDENT   GERD (gastroesophageal reflux disease)    Headache    years ago   Hypertension    Pneumonia    Stroke [facility]) March 2015    left MCA infarct, slight weakness on left side   Thyroid disease    Past Surgical History:  Procedure Laterality Date   ABDOMINAL HYSTERECTOMY     ANTERIOR FIXATION AND POSTERIOR MICRODISCECTOMY CERVICAL SPINE  1999   CAROTID ENDARTERECTOMY Left 11-04-13   cea   ENDARTERECTOMY Left 11/04/2013   IR ANGIO INTRA EXTRACRAN SEL COM CAROTID INNOMINATE BILAT MOD SED  11/16/2021   IR ANGIO INTRA EXTRACRAN SEL COM CAROTID INNOMINATE UNI L MOD SED  11/19/2021   IR ANGIO VERTEBRAL SEL VERTEBRAL BILAT MOD SED  11/16/2021   IR CT HEAD LTD  11/19/2021   IR INTRAVSC STENT CERV CAROTID W/EMB-PROT MOD SED INCL ANGIO  11/19/2021   IR RADIOLOGIST EVAL & MGMT  12/13/2021   ORIF ANKLE FRACTURE Left 09/16/2017   Procedure: OPEN REDUCTION INTERNAL FIXATION (ORIF) LEFT ANKLE FRACTURE;  Surgeon: Teryl Lucy, MD;  Location: [facility] OR;   Service: Orthopedics;  Laterality: Left;   RADIOLOGY WITH ANESTHESIA N/A 11/19/2021   Procedure: Left carotid angioplasty with possible stenting;  Surgeon: Julieanne Cotton, MD;  Location: [facility] OR;  Service: Radiology;  Laterality: N/A;   Patient Active Problem List   Diagnosis Date Noted   Malnutrition of moderate degree 01/21/2023   Hyperkalemia 01/19/2023   Dehydration 01/19/2023   Acute prerenal azotemia 01/19/2023   Allergic rhinitis 01/19/2023   Chronic diastolic CHF (congestive heart failure) (HCC) 01/19/2023   AKI (acute kidney injury) (HCC) 01/18/2023   Chronic idiopathic constipation 08/31/2022   Chronic renal disease, stage 4, severely decreased glomerular filtration rate (GFR) between 15-29 mL/min/1.73 square meter (HCC) 06/08/2022   Chronic midline low back pain with bilateral sciatica 06/01/2022   Generalized weakness 03/29/2022   Multinodular goiter 11/02/2021   Encounter for general adult medical examination with abnormal findings 04/08/2021   Visit for screening mammogram 04/01/2020   Type 2 diabetes mellitus with complication, with long-term current use of insulin (HCC) 09/18/2018   Age-related osteoporosis with current pathological fracture 02/20/2018   Syncope 12/11/2016   SIRS (systemic inflammatory response syndrome) (HCC) 12/11/2016   Intractable migraine without aura and  with status migrainosus 11/24/2016   GERD (gastroesophageal reflux disease) 03/08/2016   Major depressive disorder, recurrent episode (HCC) 02/20/2016   Occlusion and stenosis of carotid artery with cerebral infarction 11/25/2013   Depression 11/25/2013   Cerebrovascular accident (CVA) due to stenosis of left middle cerebral artery (HCC) 11/02/2013   Vitamin D deficiency 10/19/2013   Tobacco abuse 07/31/2013   Essential hypertension, benign 04/20/2013   DM2 (diabetes mellitus, type 2) (HCC) 04/20/2013   Acquired hypothyroidism 04/20/2013   Hyperlipidemia with target LDL less than 70 04/20/2013     ONSET DATE: 01-18-23  REFERRING DIAG:  Diagnosis  S09.90XA (ICD-10-CM) - Injury of head, initial encounter    THERAPY DIAG:  Other abnormalities of gait and mobility  Muscle weakness (generalized)  Unsteadiness on feet  Rationale for Evaluation and Treatment: Rehabilitation  SUBJECTIVE:                                                                                                                                                                                             SUBJECTIVE STATEMENT: Pt reports she did some walking last week;  pt reports she didn't get chance to do exercises at home but 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 No  PRECAUTIONS: None  WEIGHT BEARING RESTRICTIONS: No  FALLS: Has patient fallen in last 6 months? Yes - the one time on 01-18-23 when she passed out and fell  LIVING ENVIRONMENT: Lives with: lives alone Lives in: House/apartment Stairs: No Has following equipment at home: None  PLOF: Independent and except for driving  PATIENT GOALS: really don't have any goals - wants to improve energy; "sometimes I get off balance"   TODAY'S TREATMENT:     Self Care:  Vitals:   03/21/23 1135  BP: (!) 153/63  Pulse: 91       Instructed pt to eat due to pt's c/o feeling dizzy at end of session - pt reported she had not eaten prior to PT session   GAIT: Gait pattern: [facility] Distance walked: 400' Assistive device utilized: None Level of assistance: supervision Comments: cues to stand erect and to increase arm swing   TherEx: Pt amb. 4:54" prior to c/o LLE starting to ache - no device used - SBA (for LTG  assessment)  Performed 10x  sit to stand with feet on Airex without UE support;  held 5# kettlebell with both hands - performed sit to stand transfer with feet on floor 10 reps   Step up exercise RLE and LLE onto 6" step 10 reps each leg with UE support on rails  Leg press 50# 3 sets  10 reps bil. LE's;  30# 10 reps each leg separately  Pt was instructed in how to secure theraband in door with use of stockinette which was tied on each end - red theraband used for resistance; pt performed standing hip flexion, extension and abduction 10 reps LLE     PATIENT EDUCATION: Education details:  pt was instructed in how to secure theraband in doorway with use of stockinette - pt demonstrated understanding Person educated: Patient Education method: Explanation, demonstration, handout Education comprehension: verbalized understanding and demonstrated understanding  HOME EXERCISE PROGRAM:  Medbridge Access Code: [email or web address] URL: [email or web address] Date: 03/14/2023 Prepared by: Maebelle Munroe  Exercises - Standing Hip Flexion with Resistance Loop  - 1 x daily - 7 x weekly - 1 sets - 10 reps - Hip and Knee Flexion with Anchored Resistance  - 1 x daily - 7 x weekly - 1 sets - 10 reps - Standing Hip Abduction with Resistance at Ankles and Counter Support  - 1 x daily - 7 x weekly - 1 sets - 10 reps - Standing Hip Extension with Resistance at Ankles and Counter Support  - 1 x daily - 7 x weekly - 1 sets - 10 reps  GOALS: Goals reviewed with patient? Yes  SHORT TERM GOALS: Target date: 03-25-23= same as LTG's as ELOS = 4 weeks   LONG TERM GOALS: Target date: 03-25-23  Pt will demonstrate improved endurance/activity tolerance so she is able to amb. 10" nonstop without device. Baseline: 4:54" on 03-21-23 Goal status: In Progress   2.  Increase gait velocity to >/= 3.5 ft/sec for increased gait efficiency. Baseline: 10.53 secs = 3.11 ft/sec without device Goal status: INITIAL  3.   Improve static standing balance so pt able to stand at least 2 secs on each leg for increased safety with stepping over objects and with step negotiation. Baseline: unable to perform SLS on either leg Goal status: INITIAL  4.  Independent in HEP for endurance and balance.  Baseline:  Goal status: INITIAL   ASSESSMENT:  CLINICAL IMPRESSION: LTG's are ongoing due to not fully achieved at this time.  PT session focused on strengthening exercises for bil. LE's and endurance activities.  Pt reported feeling dizzy during last 10" of session - BP was taken with reading 153/63.  Pt checked her blood sugar with her monitor and stated it was not low; pt reported she did not eat prior to coming to PT due to having woken up late.  Pt was instructed to get something to eat and verbalized understanding and agreement.   Cont with POC.  OBJECTIVE IMPAIRMENTS: decreased activity tolerance, decreased balance, and decreased endurance.   ACTIVITY LIMITATIONS: locomotion level  PARTICIPATION LIMITATIONS: interpersonal relationship, shopping, and community activity  PERSONAL FACTORS: 1 comorbidity: h/o CVA  are also affecting patient's functional outcome.   REHAB POTENTIAL: Good  CLINICAL DECISION MAKING: Stable/uncomplicated  EVALUATION COMPLEXITY: Low  PLAN:  PT FREQUENCY: 1x/week  PT DURATION: 4 weeks  PLANNED INTERVENTIONS: Therapeutic exercises, Therapeutic activity, Neuromuscular re-education, Balance training, Gait training, Patient/Family education, Self Care, and Stair training  PLAN FOR NEXT SESSION: check HEP - cont therex; endurance training   ,  Donavan Burnet, PT 03/22/2023, 7:04 PM

## 2023-03-22 ENCOUNTER — Encounter: Payer: Self-pay | Admitting: Physical Therapy

## 2023-03-28 ENCOUNTER — Ambulatory Visit: Payer: 59 | Admitting: Physical Therapy

## 2023-03-28 ENCOUNTER — Encounter: Payer: Self-pay | Admitting: Internal Medicine

## 2023-03-28 DIAGNOSIS — M6281 Muscle weakness (generalized): Secondary | ICD-10-CM

## 2023-03-28 DIAGNOSIS — R2681 Unsteadiness on feet: Secondary | ICD-10-CM

## 2023-03-28 DIAGNOSIS — R2689 Other abnormalities of gait and mobility: Secondary | ICD-10-CM

## 2023-03-28 NOTE — Therapy (Unsigned)
OUTPATIENT PHYSICAL THERAPY NEURO TREATMENT NOTE   Patient Name: Sonia Skinner MRN: [id number] DOB:1951/04/14, 72 y.o., female Today's Date: 03/29/2023   PCP: Etta Grandchild., MD REFERRING PROVIDER: Dimple Nanas, MD  END OF SESSION:  PT End of Session - 03/29/23 1854     Visit Number 4    Number of Visits 5    Date for PT Re-Evaluation 03/25/23   pushed out due to schedule availability   Authorization Type [facility] Medicare    Authorization Time Period 02-15-23 - 04-18-23    PT Start Time 1102    PT Stop Time 1145    PT Time Calculation (min) 43 min    Activity Tolerance Patient tolerated treatment well    Behavior During Therapy [facility] for tasks assessed/performed                Past Medical History:  Diagnosis Date   Ankle fracture    Left   Anxiety    Carotid artery occlusion    Closed fracture of left distal fibula 09/16/2017   Complication of anesthesia    ALLERY TO ESTER BASE   Depression    early 30s   Diabetes mellitus    INSULIN DEPENDENT   GERD (gastroesophageal reflux disease)    Headache    years ago   Hypertension    Pneumonia    Stroke [facility]) March 2015    left MCA infarct, slight weakness on left side   Thyroid disease    Past Surgical History:  Procedure Laterality Date   ABDOMINAL HYSTERECTOMY     ANTERIOR FIXATION AND POSTERIOR MICRODISCECTOMY CERVICAL SPINE  1999   CAROTID ENDARTERECTOMY Left 11-04-13   cea   ENDARTERECTOMY Left 11/04/2013   IR ANGIO INTRA EXTRACRAN SEL COM CAROTID INNOMINATE BILAT MOD SED  11/16/2021   IR ANGIO INTRA EXTRACRAN SEL COM CAROTID INNOMINATE UNI L MOD SED  11/19/2021   IR ANGIO VERTEBRAL SEL VERTEBRAL BILAT MOD SED  11/16/2021   IR CT HEAD LTD  11/19/2021   IR INTRAVSC STENT CERV CAROTID W/EMB-PROT MOD SED INCL ANGIO  11/19/2021   IR RADIOLOGIST EVAL & MGMT  12/13/2021   ORIF ANKLE FRACTURE Left 09/16/2017   Procedure: OPEN REDUCTION INTERNAL FIXATION (ORIF) LEFT ANKLE FRACTURE;  Surgeon: Teryl Lucy, MD;  Location: [facility]  OR;  Service: Orthopedics;  Laterality: Left;   RADIOLOGY WITH ANESTHESIA N/A 11/19/2021   Procedure: Left carotid angioplasty with possible stenting;  Surgeon: Julieanne Cotton, MD;  Location: [facility] OR;  Service: Radiology;  Laterality: N/A;   Patient Active Problem List   Diagnosis Date Noted   Malnutrition of moderate degree 01/21/2023   Hyperkalemia 01/19/2023   Dehydration 01/19/2023   Acute prerenal azotemia 01/19/2023   Allergic rhinitis 01/19/2023   Chronic diastolic CHF (congestive heart failure) (HCC) 01/19/2023   AKI (acute kidney injury) (HCC) 01/18/2023   Chronic idiopathic constipation 08/31/2022   Chronic renal disease, stage 4, severely decreased glomerular filtration rate (GFR) between 15-29 mL/min/1.73 square meter (HCC) 06/08/2022   Chronic midline low back pain with bilateral sciatica 06/01/2022   Generalized weakness 03/29/2022   Multinodular goiter 11/02/2021   Encounter for general adult medical examination with abnormal findings 04/08/2021   Visit for screening mammogram 04/01/2020   Type 2 diabetes mellitus with complication, with long-term current use of insulin (HCC) 09/18/2018   Age-related osteoporosis with current pathological fracture 02/20/2018   Syncope 12/11/2016   SIRS (systemic inflammatory response syndrome) (HCC) 12/11/2016   Intractable migraine without aura  and with status migrainosus 11/24/2016   GERD (gastroesophageal reflux disease) 03/08/2016   Major depressive disorder, recurrent episode (HCC) 02/20/2016   Occlusion and stenosis of carotid artery with cerebral infarction 11/25/2013   Depression 11/25/2013   Cerebrovascular accident (CVA) due to stenosis of left middle cerebral artery (HCC) 11/02/2013   Vitamin D deficiency 10/19/2013   Tobacco abuse 07/31/2013   Essential hypertension, benign 04/20/2013   DM2 (diabetes mellitus, type 2) (HCC) 04/20/2013   Acquired hypothyroidism 04/20/2013   Hyperlipidemia with target LDL less than 70  04/20/2013    ONSET DATE: 01-18-23  REFERRING DIAG:  Diagnosis  S09.90XA (ICD-10-CM) - Injury of head, initial encounter    THERAPY DIAG:  Other abnormalities of gait and mobility  Muscle weakness (generalized)  Unsteadiness on feet  Rationale for Evaluation and Treatment: Rehabilitation  SUBJECTIVE:                                                                                                                                                                                             SUBJECTIVE STATEMENT: Pt reports no changes - doing OK Pt accompanied by:  sister in lobby  PERTINENT HISTORY: DM2, ischemic CVA 2015 with left-sided hemiparesis, chronic diastolic CHF admitted to [facility] from 6/4 due to AKI and hyperkalemia - pt reports she had syncopal episode due to dehydration and fell, hit her head  PAIN:  Are you having pain? No  PRECAUTIONS: None  WEIGHT BEARING RESTRICTIONS: No  FALLS: Has patient fallen in last 6 months? Yes - the one time on 01-18-23 when she passed out and fell  LIVING ENVIRONMENT: Lives with: lives alone Lives in: House/apartment Stairs: No Has following equipment at home: None  PLOF: Independent and except for driving  PATIENT GOALS: really don't have any goals - wants to improve energy; "sometimes I get off balance"    GAIT: Gait pattern: WFL Distance walked: 400' Assistive device utilized: None Level of assistance: supervision Comments: cues to stand erect and to increase arm swing  TODAY'S TREATMENT:   TherEx: Performed sit to stand from mat with EO - feet on floor    Step up exercise RLE and LLE onto 6" step 10 reps each leg with UE support on // bars  Leg press 50# 3 sets  10 reps bil. LE's Pt performed bil. Heel raises 10 reps with min. UE support Pt performed standing hip flexion, extension and abdct. With 3# weight 10 reps each direction, each leg  NeuroRe-ed:  Pt stood with feet together on floor - performed head  turns with EO and EC horizontal and vertical 5 reps each -  CGA Stood with EC for 10 secs with feet shoulder width apart  Rockerboard inside // bars 10 reps x 3 sets - bil. UE support, RUE support only and then no UE support with CGA; holding board steady - performed head turns horizontal and vertical 5 reps each direction  Cone taps to 2 cones to improve SLS on each leg standing on floor inside // bars- with UE support prn  Pt amb. 50' tossing and catching ball with CGA to min assist to improve balance with multi-tasking and with reaching outside BOS  PATIENT EDUCATION: Education details:  pt was instructed in how to secure theraband in doorway with use of stockinette - pt demonstrated understanding Person educated: Patient Education method: Explanation, demonstration, handout Education comprehension: verbalized understanding and demonstrated understanding  HOME EXERCISE PROGRAM:  Medbridge Access Code: [email or web address] URL: [email or web address] Date: 03/14/2023 Prepared by: Maebelle Munroe  Exercises - Standing Hip Flexion with Resistance Loop  - 1 x daily - 7 x weekly - 1 sets - 10 reps - Hip and Knee Flexion with Anchored Resistance  - 1 x daily - 7 x weekly - 1 sets - 10 reps - Standing Hip Abduction with Resistance at Ankles and Counter Support  - 1 x daily - 7 x weekly - 1 sets - 10 reps - Standing Hip Extension with Resistance at Ankles and Counter Support  - 1 x daily - 7 x weekly - 1 sets - 10 reps  GOALS: Goals reviewed with patient? Yes  SHORT TERM GOALS: Target date: 03-25-23= same as LTG's as ELOS = 4 weeks   LONG TERM GOALS: Target date: 03-25-23  Pt will demonstrate improved endurance/activity tolerance so she is able to amb. 10" nonstop without device. Baseline: 4:54" on 03-21-23 Goal status: In Progress   2.  Increase gait velocity to >/= 3.5 ft/sec for increased gait efficiency. Baseline: 10.53 secs = 3.11 ft/sec without device Goal status: INITIAL  3.   Improve static standing balance so pt able to stand at least 2 secs on each leg for increased safety with stepping over objects and with step negotiation. Baseline: unable to perform SLS on either leg Goal status: INITIAL  4.  Independent in HEP for endurance and balance.  Baseline:  Goal status: INITIAL   ASSESSMENT:  CLINICAL IMPRESSION:  PT session focused on balance training with EO and EC for increased vestibular input and also on strengthening exercises for bil. LE's.  Pt did well with amb. Tossing and catching ball with CGA needed for safety with reaching outside BOS.  Plan D/C next session.  Cont with POC.  OBJECTIVE IMPAIRMENTS: decreased activity tolerance, decreased balance, and decreased endurance.   ACTIVITY LIMITATIONS: locomotion level  PARTICIPATION LIMITATIONS: interpersonal relationship, shopping, and community activity  PERSONAL FACTORS: 1 comorbidity: h/o CVA  are also affecting patient's functional outcome.   REHAB POTENTIAL: Good  CLINICAL DECISION MAKING: Stable/uncomplicated  EVALUATION COMPLEXITY: Low  PLAN:  PT FREQUENCY: 1x/week  PT DURATION: 4 weeks  PLANNED INTERVENTIONS: Therapeutic exercises, Therapeutic activity, Neuromuscular re-education, Balance training, Gait training, Patient/Family education, Self Care, and Stair training  PLAN FOR NEXT SESSION: check LTG's - D/C next session   , Donavan Burnet, PT 03/29/2023, 6:57 PM

## 2023-03-29 ENCOUNTER — Encounter: Payer: Self-pay | Admitting: Physical Therapy

## 2023-03-31 ENCOUNTER — Encounter (INDEPENDENT_AMBULATORY_CARE_PROVIDER_SITE_OTHER): Payer: Self-pay

## 2023-04-04 ENCOUNTER — Ambulatory Visit: Payer: 59 | Admitting: Physical Therapy

## 2023-04-04 ENCOUNTER — Encounter: Payer: Self-pay | Admitting: Physical Therapy

## 2023-04-04 DIAGNOSIS — R2681 Unsteadiness on feet: Secondary | ICD-10-CM

## 2023-04-04 DIAGNOSIS — R2689 Other abnormalities of gait and mobility: Secondary | ICD-10-CM

## 2023-04-04 DIAGNOSIS — M6281 Muscle weakness (generalized): Secondary | ICD-10-CM

## 2023-04-04 NOTE — Therapy (Signed)
OUTPATIENT PHYSICAL THERAPY NEURO TREATMENT NOTE/DISCHARGE SUMMARY   Patient Name: Sonia Skinner MRN: [id number] DOB:[birth year], 72 y.o., female Today's Date: 04/04/2023   PCP: Etta Grandchild., MD REFERRING PROVIDER: Dimple Nanas, MD  END OF SESSION:  PT End of Session - 04/04/23 1931     Visit Number 5    Number of Visits 5    Date for PT Re-Evaluation 03/25/23   pushed out due to schedule availability   Authorization Type [facility] Medicare    Authorization Time Period 02-15-23 - 04-18-23    PT Start Time 1101    PT Stop Time 1147    PT Time Calculation (min) 46 min    Activity Tolerance Patient tolerated treatment well    Behavior During Therapy [facility] for tasks assessed/performed                 Past Medical History:  Diagnosis Date   Ankle fracture    Left   Anxiety    Carotid artery occlusion    Closed fracture of left distal fibula 09/16/2017   Complication of anesthesia    ALLERY TO ESTER BASE   Depression    early 30s   Diabetes mellitus    INSULIN DEPENDENT   GERD (gastroesophageal reflux disease)    Headache    years ago   Hypertension    Pneumonia    Stroke [facility]) March 2015    left MCA infarct, slight weakness on left side   Thyroid disease    Past Surgical History:  Procedure Laterality Date   ABDOMINAL HYSTERECTOMY     ANTERIOR FIXATION AND POSTERIOR MICRODISCECTOMY CERVICAL SPINE  1999   CAROTID ENDARTERECTOMY Left 11-04-13   cea   ENDARTERECTOMY Left 11/04/2013   IR ANGIO INTRA EXTRACRAN SEL COM CAROTID INNOMINATE BILAT MOD SED  11/16/2021   IR ANGIO INTRA EXTRACRAN SEL COM CAROTID INNOMINATE UNI L MOD SED  11/19/2021   IR ANGIO VERTEBRAL SEL VERTEBRAL BILAT MOD SED  11/16/2021   IR CT HEAD LTD  11/19/2021   IR INTRAVSC STENT CERV CAROTID W/EMB-PROT MOD SED INCL ANGIO  11/19/2021   IR RADIOLOGIST EVAL & MGMT  12/13/2021   ORIF ANKLE FRACTURE Left 09/16/2017   Procedure: OPEN REDUCTION INTERNAL FIXATION (ORIF) LEFT ANKLE FRACTURE;  Surgeon: Teryl Lucy,  MD;  Location: [facility] OR;  Service: Orthopedics;  Laterality: Left;   RADIOLOGY WITH ANESTHESIA N/A 11/19/2021   Procedure: Left carotid angioplasty with possible stenting;  Surgeon: Julieanne Cotton, MD;  Location: [facility] OR;  Service: Radiology;  Laterality: N/A;   Patient Active Problem List   Diagnosis Date Noted   Malnutrition of moderate degree 01/21/2023   Hyperkalemia 01/19/2023   Dehydration 01/19/2023   Acute prerenal azotemia 01/19/2023   Allergic rhinitis 01/19/2023   Chronic diastolic CHF (congestive heart failure) (HCC) 01/19/2023   AKI (acute kidney injury) (HCC) 01/18/2023   Chronic idiopathic constipation 08/31/2022   Chronic renal disease, stage 4, severely decreased glomerular filtration rate (GFR) between 15-29 mL/min/1.73 square meter (HCC) 06/08/2022   Chronic midline low back pain with bilateral sciatica 06/01/2022   Generalized weakness 03/29/2022   Multinodular goiter 11/02/2021   Encounter for general adult medical examination with abnormal findings 04/08/2021   Visit for screening mammogram 04/01/2020   Type 2 diabetes mellitus with complication, with long-term current use of insulin (HCC) 09/18/2018   Age-related osteoporosis with current pathological fracture 02/20/2018   Syncope 12/11/2016   SIRS (systemic inflammatory response syndrome) (HCC) 12/11/2016   Intractable migraine  without aura and with status migrainosus 11/24/2016   GERD (gastroesophageal reflux disease) 03/08/2016   Major depressive disorder, recurrent episode (HCC) 02/20/2016   Occlusion and stenosis of carotid artery with cerebral infarction 11/25/2013   Depression 11/25/2013   Cerebrovascular accident (CVA) due to stenosis of left middle cerebral artery (HCC) 11/02/2013   Vitamin D deficiency 10/19/2013   Tobacco abuse 07/31/2013   Essential hypertension, benign 04/20/2013   DM2 (diabetes mellitus, type 2) (HCC) 04/20/2013   Acquired hypothyroidism 04/20/2013   Hyperlipidemia with target LDL  less than 70 04/20/2013    ONSET DATE: 01-18-23  REFERRING DIAG:  Diagnosis  S09.90XA (ICD-10-CM) - Injury of head, initial encounter    THERAPY DIAG:  Other abnormalities of gait and mobility  Muscle weakness (generalized)  Unsteadiness on feet  Rationale for Evaluation and Treatment: Rehabilitation  SUBJECTIVE:                                                                                                                                                                                             SUBJECTIVE STATEMENT: Pt reports she is doing OK - ready for discharge today Pt accompanied by:  sister in lobby  PERTINENT HISTORY: DM2, ischemic CVA 2015 with left-sided hemiparesis, chronic diastolic CHF admitted to [facility] from 6/4 due to AKI and hyperkalemia - pt reports she had syncopal episode due to dehydration and fell, hit her head  PAIN:  Are you having pain? No  PRECAUTIONS: None  WEIGHT BEARING RESTRICTIONS: No  FALLS: Has patient fallen in last 6 months? Yes - the one time on 01-18-23 when she passed out and fell  LIVING ENVIRONMENT: Lives with: lives alone Lives in: House/apartment Stairs: No Has following equipment at home: None  PLOF: Independent and except for driving  PATIENT GOALS: really don't have any goals - wants to improve energy; "sometimes I get off balance"    GAIT: Gait pattern: [facility] Distance walked: 400' Assistive device utilized: None Level of assistance: supervision Comments: cues to stand erect and to increase arm swing  TODAY'S TREATMENT:  04-04-23  Gait:  Pt gait trained outside on paved uneven surface (sidewalk and parking lot) for 7" 12 secs prior to needing seated rest period due to fatigue; then additional 3" outside for LTG assessment; pt modified independent with no use of assistive device  Gait velocity 11.40 secs = 2.88 ft/sec with no device   TherEx:   Step up exercise RLE and LLE onto 6" step 10 reps each leg with UE  support on // bars  Pt performed bil. Heel raises 10 reps with min. UE support on counter:  LLE only 10 reps:  RLE only 10 reps with min. Bil. UE support on counter with each exercise  NeuroRe-ed:  Pt stood with feet together on floor - performed head turns with EO and EC horizontal and vertical 5 reps each - CGA Stood with EC for 10 secs with feet shoulder width apa  Cone taps to 2 cones to improve SLS on each leg standing on floor - with UE support prn on computer table placed next to pt  Marching on floor with UE support prn - 10 reps - EO  Standing Balance: Surface: Airex Position: Feet Hip Width Apart Completed with: Eyes Open and Eyes Closed; Head Turns x 5 Reps and Head Nods x 5 Reps  Single Leg Stance:   Surface: Floor  Lower Extremity: RLE and LLE  Time: RLE 9.87 secs:  LLE 3.5 secs     PATIENT EDUCATION: Education details:  pt was instructed in how to secure theraband in doorway with use of stockinette - pt demonstrated understanding Person educated: Patient Education method: Explanation, demonstration, handout Education comprehension: verbalized understanding and demonstrated understanding  HOME EXERCISE PROGRAM:  Medbridge Access Code: [email or web address] URL: [email or web address] Date: 03/14/2023 Prepared by: Maebelle Munroe  Exercises - Standing Hip Flexion with Resistance Loop  - 1 x daily - 7 x weekly - 1 sets - 10 reps - Hip and Knee Flexion with Anchored Resistance  - 1 x daily - 7 x weekly - 1 sets - 10 reps - Standing Hip Abduction with Resistance at Ankles and Counter Support  - 1 x daily - 7 x weekly - 1 sets - 10 reps - Standing Hip Extension with Resistance at Ankles and Counter Support  - 1 x daily - 7 x weekly - 1 sets - 10 reps   HEP updated 04-04-23: Medbridge [email or web address]  Access Code: [email or web address] URL: [email or web address] Date: 04/04/2023 Prepared by: Maebelle Munroe  Exercises - Single Leg Stance  - 1 x daily - 7 x weekly - 1  sets - 2-3 reps - 10 sec hold - Single Leg Heel Raise with Counter Support  - 1 x daily - 7 x weekly - 1 sets - 10 reps - 2 secs hold - Standing March with Counter Support  - 1 x daily - 7 x weekly - 1 sets - 10 reps - Standing Balance with Eyes Closed on Foam  - 1 x daily - 7 x weekly - 1 sets - 2 reps  GOALS: Goals reviewed with patient? Yes  SHORT TERM GOALS: Target date: 03-25-23= same as LTG's as ELOS = 4 weeks   LONG TERM GOALS: Target date: 03-25-23  Pt will demonstrate improved endurance/activity tolerance so she is able to amb. 10" nonstop without device. Baseline: 4:54" on 03-21-23;  7:12" - seated break for approx. 1"; then 3:00 additional walking Goal status: Partially met - 04-04-23  2.  Increase gait velocity to >/= 3.5 ft/sec for increased gait efficiency. Baseline: 10.53 secs = 3.11 ft/sec without device;  11.40, 11.53 secs = 2.88 ft/sec on 04-04-23 Goal status: Not met  3.  Improve static standing balance so pt able to stand at least 2 secs on each leg for increased safety with stepping over objects and with step negotiation. Baseline: unable to perform SLS on either leg:  04-04-23 - RLE 9.87 secs:  LLE 3.5 secs Goal status: Goal met 04-04-23  4.  Independent in HEP for endurance and balance.  Baseline:  Goal status: Goal met 04-04-23   ASSESSMENT:  CLINICAL IMPRESSION:  Pt has met LTG's #3 and 4:  LTG #1 partially met as pt able to amb. 10" total time but she did require 1 seated rest break after amb. 7" 12 secs:  LTG #2 not met as gait velocity = 2.88 ft/sec with no device in today's session.  Pt is discharged due to completion of program and majority of goals achieved.    OBJECTIVE IMPAIRMENTS: decreased activity tolerance, decreased balance, and decreased endurance.   ACTIVITY LIMITATIONS: locomotion level  PARTICIPATION LIMITATIONS: interpersonal relationship, shopping, and community activity  PERSONAL FACTORS: 1 comorbidity: h/o CVA  are also affecting patient's  functional outcome.   REHAB POTENTIAL: Good  CLINICAL DECISION MAKING: Stable/uncomplicated  EVALUATION COMPLEXITY: Low  PLAN:  PT FREQUENCY: 1x/week  PT DURATION: 4 weeks  PLANNED INTERVENTIONS: Therapeutic exercises, Therapeutic activity, Neuromuscular re-education, Balance training, Gait training, Patient/Family education, Self Care, and Stair training  PLAN FOR NEXT SESSION: D/C 04-04-23    PHYSICAL THERAPY DISCHARGE SUMMARY  Visits from Start of Care: 5  Current functional level related to goals / functional outcomes: See above for progress towards goals   Remaining deficits: Decreased endurance/activity tolerance Decreased high level balance skills Decreased strength in LLE (minimal)    Education / Equipment: Pt has been instructed in HEP for strengthening, balance and walking program   Patient agrees to discharge. Patient goals were met. Patient is being discharged due to meeting the stated rehab goals. Pt is also discharged due to completion of program and no further needs identified.    Kary Kos, PT 04/04/2023, 7:35 PM

## 2023-04-07 ENCOUNTER — Other Ambulatory Visit (HOSPITAL_COMMUNITY): Payer: Self-pay | Admitting: Psychiatry

## 2023-04-07 DIAGNOSIS — F411 Generalized anxiety disorder: Secondary | ICD-10-CM

## 2023-04-07 DIAGNOSIS — R251 Tremor, unspecified: Secondary | ICD-10-CM

## 2023-04-07 DIAGNOSIS — F331 Major depressive disorder, recurrent, moderate: Secondary | ICD-10-CM

## 2023-04-11 ENCOUNTER — Encounter: Payer: Self-pay | Admitting: Internal Medicine

## 2023-04-11 ENCOUNTER — Other Ambulatory Visit (HOSPITAL_COMMUNITY): Payer: Self-pay | Admitting: Psychiatry

## 2023-04-11 ENCOUNTER — Other Ambulatory Visit: Payer: Self-pay | Admitting: Internal Medicine

## 2023-04-11 DIAGNOSIS — E039 Hypothyroidism, unspecified: Secondary | ICD-10-CM

## 2023-04-11 DIAGNOSIS — F411 Generalized anxiety disorder: Secondary | ICD-10-CM

## 2023-04-11 DIAGNOSIS — F331 Major depressive disorder, recurrent, moderate: Secondary | ICD-10-CM

## 2023-04-11 DIAGNOSIS — E119 Type 2 diabetes mellitus without complications: Secondary | ICD-10-CM

## 2023-04-11 MED ORDER — DEXCOM G7 SENSOR MISC
1.0000 | Freq: Every day | 5 refills | Status: DC
Start: 2023-04-11 — End: 2023-05-04

## 2023-04-19 DIAGNOSIS — N1832 Chronic kidney disease, stage 3b: Secondary | ICD-10-CM | POA: Diagnosis not present

## 2023-04-19 DIAGNOSIS — E1122 Type 2 diabetes mellitus with diabetic chronic kidney disease: Secondary | ICD-10-CM | POA: Diagnosis not present

## 2023-04-19 DIAGNOSIS — D631 Anemia in chronic kidney disease: Secondary | ICD-10-CM | POA: Diagnosis not present

## 2023-04-19 DIAGNOSIS — I639 Cerebral infarction, unspecified: Secondary | ICD-10-CM | POA: Diagnosis not present

## 2023-04-19 DIAGNOSIS — N179 Acute kidney failure, unspecified: Secondary | ICD-10-CM | POA: Diagnosis not present

## 2023-04-19 DIAGNOSIS — I129 Hypertensive chronic kidney disease with stage 1 through stage 4 chronic kidney disease, or unspecified chronic kidney disease: Secondary | ICD-10-CM | POA: Diagnosis not present

## 2023-04-19 DIAGNOSIS — N1831 Chronic kidney disease, stage 3a: Secondary | ICD-10-CM | POA: Diagnosis not present

## 2023-04-19 LAB — CBC AND DIFFERENTIAL
HCT: 35 — AB (ref 36–46)
Hemoglobin: 11.5 — AB (ref 12.0–16.0)
Platelets: 248 10*3/uL (ref 150–400)
WBC: 8.4

## 2023-04-19 LAB — COMPREHENSIVE METABOLIC PANEL WITH GFR
Albumin: 4.5 (ref 3.5–5.0)
Calcium: 10.1 (ref 8.7–10.7)
eGFR: 24

## 2023-04-19 LAB — BASIC METABOLIC PANEL WITH GFR
BUN: 25 — AB (ref 4–21)
CO2: 24 — AB (ref 13–22)
Chloride: 103 (ref 99–108)
Creatinine: 2.2 — AB (ref 0.5–1.1)
Glucose: 175
Potassium: 4.7 meq/L (ref 3.5–5.1)
Sodium: 139 (ref 137–147)

## 2023-04-19 LAB — PROTEIN / CREATININE RATIO, URINE
Albumin, U: 3.9
Creatinine, Urine: 188.3

## 2023-04-20 LAB — HM DIABETES EYE EXAM

## 2023-04-20 LAB — LAB REPORT - SCANNED
Albumin, Urine POC: 3.9
Albumin/Creatinine Ratio, Urine, POC: 2
Creatinine, POC: 188.3 mg/dL
EGFR: 24

## 2023-05-03 ENCOUNTER — Encounter: Payer: Self-pay | Admitting: Internal Medicine

## 2023-05-03 ENCOUNTER — Ambulatory Visit: Payer: 59 | Admitting: Internal Medicine

## 2023-05-03 ENCOUNTER — Ambulatory Visit (INDEPENDENT_AMBULATORY_CARE_PROVIDER_SITE_OTHER): Payer: 59 | Admitting: Internal Medicine

## 2023-05-03 VITALS — BP 134/62 | HR 78 | Temp 98.0°F | Ht 62.0 in | Wt 127.0 lb

## 2023-05-03 DIAGNOSIS — G8929 Other chronic pain: Secondary | ICD-10-CM | POA: Diagnosis not present

## 2023-05-03 DIAGNOSIS — E785 Hyperlipidemia, unspecified: Secondary | ICD-10-CM | POA: Diagnosis not present

## 2023-05-03 DIAGNOSIS — M5442 Lumbago with sciatica, left side: Secondary | ICD-10-CM

## 2023-05-03 DIAGNOSIS — I10 Essential (primary) hypertension: Secondary | ICD-10-CM | POA: Diagnosis not present

## 2023-05-03 DIAGNOSIS — E118 Type 2 diabetes mellitus with unspecified complications: Secondary | ICD-10-CM | POA: Diagnosis not present

## 2023-05-03 DIAGNOSIS — E538 Deficiency of other specified B group vitamins: Secondary | ICD-10-CM

## 2023-05-03 DIAGNOSIS — Z794 Long term (current) use of insulin: Secondary | ICD-10-CM

## 2023-05-03 DIAGNOSIS — E039 Hypothyroidism, unspecified: Secondary | ICD-10-CM

## 2023-05-03 DIAGNOSIS — N184 Chronic kidney disease, stage 4 (severe): Secondary | ICD-10-CM | POA: Diagnosis not present

## 2023-05-03 DIAGNOSIS — M5441 Lumbago with sciatica, right side: Secondary | ICD-10-CM | POA: Diagnosis not present

## 2023-05-03 DIAGNOSIS — E1142 Type 2 diabetes mellitus with diabetic polyneuropathy: Secondary | ICD-10-CM

## 2023-05-03 DIAGNOSIS — Z720 Tobacco use: Secondary | ICD-10-CM

## 2023-05-03 DIAGNOSIS — Z515 Encounter for palliative care: Secondary | ICD-10-CM | POA: Diagnosis not present

## 2023-05-03 LAB — HEMOGLOBIN A1C: Hgb A1c MFr Bld: 9.2 % — ABNORMAL HIGH (ref 4.6–6.5)

## 2023-05-03 LAB — LIPID PANEL
Cholesterol: 115 mg/dL (ref 0–200)
HDL: 38.7 mg/dL — ABNORMAL LOW (ref >39.00)
LDL Cholesterol: 54 mg/dL (ref 0–99)
NonHDL: 76.53
Total CHOL/HDL Ratio: 3
Triglycerides: 113 mg/dL (ref 0.0–149.0)
VLDL: 22.6 mg/dL (ref 0.0–40.0)

## 2023-05-03 LAB — FOLATE: Folate: 13.3 ng/mL (ref >5.9)

## 2023-05-03 LAB — TSH: TSH: 1.09 u[IU]/mL (ref 0.35–5.50)

## 2023-05-03 MED ORDER — CYANOCOBALAMIN 1000 MCG/ML IJ SOLN
1000.0000 ug | Freq: Once | INTRAMUSCULAR | Status: AC
Start: 2023-05-03 — End: 2023-05-03
  Administered 2023-05-03: 1000 ug via INTRAMUSCULAR

## 2023-05-03 MED ORDER — HYDROCODONE-ACETAMINOPHEN 5-325 MG PO TABS: 1.0000 | ORAL_TABLET | Freq: Four times a day (QID) | ORAL | 0 refills | Status: DC | PRN

## 2023-05-03 NOTE — Progress Notes (Signed)
Subjective:  Patient ID: Sonia Skinner, female    DOB: [birth year]  Age: 72 y.o. MRN: [id number]  CC: Hyperlipidemia, Hypertension, and Diabetes   HPI Sonia Skinner presents for f/up ----  Discussed the use of AI scribe software for clinical note transcription with the patient, who gave verbal consent to proceed.  History of Present Illness   The patient presents with a primary complaint of persistent fatigue, severe enough to desire to sleep all day. They deny associated chest pain, shortness of breath, changes in weight or appetite, and nausea or vomiting. However, they report having to discontinue Ozempic, a medication taken once a week, due to complete loss of appetite.  The patient has a history of dehydration, for which they were hospitalized in June. Since then, they have gained weight, which they are comfortable with, despite familial concerns.  They report high blood sugar levels, with a recent reading of 280, but deny symptoms of excessive thirst or urination. They are under the care of a nephrologist for kidney disease, with recent labs performed two weeks ago. Their blood work fluctuates, and their amlodipine dosage was recently reduced from 10 to 5.  The patient has a history of B12 deficiency and has experienced fatigue in the past. They deny any coughing, shortness of breath, or chest pain.       Outpatient Medications Prior to Visit  Medication Sig Dispense Refill   Accu-Chek FastClix Lancets MISC Use to check blood sugar five times daily. 510 each 2   ACCU-CHEK GUIDE test strip Use to check blood sugar twice a day. E11.9 100 each 2   amLODipine (NORVASC) 10 MG tablet Take 1 tablet (10 mg total) by mouth daily. (Patient taking differently: Take 5 mg by mouth daily.) 90 tablet 3   aspirin 81 MG chewable tablet Chew 1 tablet (81 mg total) by mouth daily. 30 tablet 1   atorvastatin (LIPITOR) 40 MG tablet Take 1 tablet by mouth daily. 90 tablet 0   benztropine (COGENTIN) 0.5 MG  tablet Take 1 tablet (0.5 mg total) by mouth at bedtime. 90 tablet 0   brexpiprazole (REXULTI) 1 MG TABS tablet Take 1 tablet (1 mg total) by mouth daily. 90 tablet 0   buPROPion (WELLBUTRIN XL) 300 MG 24 hr tablet Take 1 tablet (300 mg total) by mouth daily. 90 tablet 0   Continuous Blood Gluc Receiver (DEXCOM G7 RECEIVER) DEVI 1 Act by Does not apply route daily. 2 each 5   dexlansoprazole (DEXILANT) 60 MG capsule Take 1 capsule by mouth daily. 90 capsule 0   ezetimibe (ZETIA) 10 MG tablet Take 1 tablet by mouth once daily. 90 tablet 1   gabapentin (NEURONTIN) 100 MG capsule Take 1 capsule (100 mg total) by mouth 3 (three) times daily. 270 capsule 1   levothyroxine (SYNTHROID) 88 MCG tablet Take 1 tablet by mouth daily. 90 tablet 1   lubiprostone (AMITIZA) 24 MCG capsule Take 1 capsule (24 mcg total) by mouth 2 (two) times daily with a meal. 180 capsule 1   traZODone (DESYREL) 50 MG tablet Take 1 tablet (50 mg total) by mouth at bedtime as needed for sleep. 90 tablet 0   Cholecalciferol (VITAMIN D3 PO) Take 1 tablet by mouth daily. Unsure of dose     Continuous Blood Gluc Sensor (DEXCOM G7 SENSOR) MISC 1 Act by Does not apply route daily. 2 each 5   Continuous Blood Gluc Sensor (DEXCOM G7 SENSOR) MISC USE AS DIRECTED TOPICALLY  EVERY  10  DAYS 18 each 1   Continuous Glucose Sensor (DEXCOM G7 SENSOR) MISC 1 Act by Does not apply route daily. 2 each 5   EDARBYCLOR 40-12.5 MG TABS Take 1 tablet by mouth daily. 90 tablet 0   HUMALOG KWIKPEN 200 UNIT/ML KwikPen Inject 5 units under the skin three times daily with meals. 9 mL 0   HYDROcodone-acetaminophen (NORCO) 5-325 MG tablet Take 1 tablet by mouth every 6 (six) hours as needed for moderate pain. 100 tablet 0   insulin glargine, 1 Unit Dial, (TOUJEO SOLOSTAR) 300 UNIT/ML Solostar Pen Inject 20-22 Units into the skin daily. 18 mL 3   loperamide (IMODIUM) 2 MG capsule Take 1 capsule (2 mg total) by mouth 4 (four) times daily as needed for diarrhea or  loose stools. 12 capsule 0   loratadine (CLARITIN) 10 MG tablet Take 1 tablet (10 mg total) by mouth daily. 90 tablet 0   No facility-administered medications prior to visit.    ROS Review of Systems  Constitutional:  Positive for fatigue. Negative for chills, diaphoresis and unexpected weight change.  Respiratory: Negative.  Negative for cough, choking, shortness of breath and wheezing.   Cardiovascular:  Negative for chest pain, palpitations and leg swelling.  Gastrointestinal: Negative.  Negative for abdominal pain, constipation, diarrhea, nausea and vomiting.  Genitourinary: Negative.  Negative for difficulty urinating, dysuria and hematuria.  Musculoskeletal:  Positive for back pain and gait problem. Negative for arthralgias, joint swelling and myalgias.  Skin: Negative.  Negative for pallor.  Neurological:  Negative for dizziness.  Hematological:  Negative for adenopathy. Does not bruise/bleed easily.  Psychiatric/Behavioral: Negative.      Objective:  BP 134/62 (BP Location: Right Arm, Patient Position: Sitting, Cuff Size: Normal)   Pulse 78   Temp 98 F (36.7 C) (Oral)   Ht 5\' 2"  (1.575 m)   Wt 127 lb (57.6 kg)   SpO2 98%   BMI 23.23 kg/m   BP Readings from Last 3 Encounters:  05/04/23 130/60  05/03/23 134/62  03/21/23 (!) 153/63    Wt Readings from Last 3 Encounters:  05/04/23 127 lb 12.8 oz (58 kg)  05/03/23 127 lb (57.6 kg)  01/27/23 116 lb (52.6 kg)    Physical Exam Vitals reviewed.  Constitutional:      Appearance: Normal appearance.  HENT:     Mouth/Throat:     Mouth: Mucous membranes are moist.  Eyes:     General: No scleral icterus.    Conjunctiva/sclera: Conjunctivae normal.  Cardiovascular:     Rate and Rhythm: Normal rate and regular rhythm.     Heart sounds: No murmur heard. Pulmonary:     Effort: Pulmonary effort is normal.     Breath sounds: No stridor. No wheezing, rhonchi or rales.  Abdominal:     General: Abdomen is flat.      Palpations: There is no mass.     Tenderness: There is no abdominal tenderness. There is no guarding.     Hernia: No hernia is present.  Musculoskeletal:        General: Normal range of motion.     Cervical back: Neck supple.     Right lower leg: No edema.     Left lower leg: No edema.  Lymphadenopathy:     Cervical: No cervical adenopathy.  Skin:    General: Skin is warm and dry.     Findings: No rash.  Neurological:     General: No focal deficit present.  Mental Status: She is alert.  Psychiatric:        Mood and Affect: Mood normal.        Behavior: Behavior normal.     Lab Results  Component Value Date   WBC 8.4 04/19/2023   HGB 11.5 (A) 04/19/2023   HCT 35 (A) 04/19/2023   PLT 248 04/19/2023   GLUCOSE 161 (H) 01/27/2023   CHOL 115 05/03/2023   TRIG 113.0 05/03/2023   HDL 38.70 (L) 05/03/2023   LDLDIRECT 141 (H) 03/04/2010   LDLCALC 54 05/03/2023   ALT 20 08/12/2022   AST 15 08/12/2022   NA 139 04/19/2023   K 4.7 04/19/2023   CL 103 04/19/2023   CREATININE 2.2 (A) 04/19/2023   BUN 25 (A) 04/19/2023   CO2 24 (A) 04/19/2023   TSH 1.09 05/03/2023   INR 1.1 11/25/2021   HGBA1C 9.2 (H) 05/03/2023   MICROALBUR <0.7 03/29/2022    DG Chest Port 1 View  Result Date: 01/18/2023 CLINICAL DATA:  Weakness.  Patient fell and struck head. EXAM: PORTABLE CHEST 1 VIEW COMPARISON:  08/31/2022 FINDINGS: Normal heart size and pulmonary vascularity. No focal airspace disease or consolidation in the lungs. No blunting of costophrenic angles. No pneumothorax. Mediastinal contours appear intact. Degenerative changes in the spine. Calcification of the aorta. IMPRESSION: No active disease. Electronically Signed   By: Burman Nieves M.D.   On: 01/18/2023 19:41   CT HEAD WO CONTRAST  Result Date: 01/18/2023 CLINICAL DATA:  Abrasion above the right eye after fall on Monday with possible loss of consciousness EXAM: CT HEAD WITHOUT CONTRAST TECHNIQUE: Contiguous axial images were  obtained from the base of the skull through the vertex without intravenous contrast. RADIATION DOSE REDUCTION: This exam was performed according to the departmental dose-optimization program which includes automated exposure control, adjustment of the mA and/or kV according to patient size and/or use of iterative reconstruction technique. COMPARISON:  CT head 02/17/2022 FINDINGS: Brain: No intracranial hemorrhage, mass effect, or evidence of acute infarct. No hydrocephalus. No extra-axial fluid collection. Generalized cerebral atrophy. Ill-defined hypoattenuation within the cerebral white matter is nonspecific but consistent with chronic small vessel ischemic disease. Chronic left parietal infarct. Vascular: No hyperdense vessel. Intracranial arterial calcification. Skull: No fracture or focal lesion. Small contusion about the right forehead. Sinuses/Orbits: No acute finding. Frothy secretions left sphenoid sinus. Paranasal sinuses and mastoid air cells are otherwise well aerated. Other: None. IMPRESSION: 1. No acute intracranial abnormality. 2. Small contusion about the right forehead. No calvarial fracture. Electronically Signed   By: Minerva Fester M.D.   On: 01/18/2023 18:51    Assessment & Plan:   Chronic renal disease, stage 4, severely decreased glomerular filtration rate (GFR) between 15-29 mL/min/1.73 square meter (HCC)  Type 2 diabetes mellitus with complication, with long-term current use of insulin (HCC) -     HM Diabetes Foot Exam  Tobacco abuse -     Ambulatory Referral for Lung Cancer Scre  Type 2 diabetes mellitus with diabetic polyneuropathy, with long-term current use of insulin (HCC) -     Hemoglobin A1c; Future  Hyperlipidemia with target LDL less than 70 - LDL goal achieved. Doing well on the statin  -     TSH; Future -     Lipid panel; Future  Acquired hypothyroidism - She is euthyroid. -     TSH; Future  Essential hypertension, benign - BP is well controlled.  B12  deficiency -     Cyanocobalamin -  Folate; Future  Chronic midline low back pain with bilateral sciatica -     HYDROcodone-Acetaminophen; Take 1 tablet by mouth every 6 (six) hours as needed for moderate pain.  Dispense: 100 tablet; Refill: 0  Encounter for palliative care involving management of pain -     HYDROcodone-Acetaminophen; Take 1 tablet by mouth every 6 (six) hours as needed for moderate pain.  Dispense: 100 tablet; Refill: 0     Follow-up: Return in about 4 months (around 09/02/2023).  Sanda Linger, MD

## 2023-05-03 NOTE — Patient Instructions (Signed)

## 2023-05-04 ENCOUNTER — Encounter: Payer: Self-pay | Admitting: Internal Medicine

## 2023-05-04 ENCOUNTER — Ambulatory Visit (INDEPENDENT_AMBULATORY_CARE_PROVIDER_SITE_OTHER): Payer: 59 | Admitting: Internal Medicine

## 2023-05-04 VITALS — BP 130/60 | HR 77 | Resp 16 | Ht 62.0 in | Wt 127.8 lb

## 2023-05-04 DIAGNOSIS — E118 Type 2 diabetes mellitus with unspecified complications: Secondary | ICD-10-CM | POA: Diagnosis not present

## 2023-05-04 DIAGNOSIS — Z7984 Long term (current) use of oral hypoglycemic drugs: Secondary | ICD-10-CM

## 2023-05-04 DIAGNOSIS — E039 Hypothyroidism, unspecified: Secondary | ICD-10-CM

## 2023-05-04 DIAGNOSIS — Z794 Long term (current) use of insulin: Secondary | ICD-10-CM

## 2023-05-04 DIAGNOSIS — E042 Nontoxic multinodular goiter: Secondary | ICD-10-CM

## 2023-05-04 DIAGNOSIS — E119 Type 2 diabetes mellitus without complications: Secondary | ICD-10-CM

## 2023-05-04 DIAGNOSIS — Z7985 Long-term (current) use of injectable non-insulin antidiabetic drugs: Secondary | ICD-10-CM | POA: Diagnosis not present

## 2023-05-04 MED ORDER — HUMALOG KWIKPEN 200 UNIT/ML ~~LOC~~ SOPN
5.0000 [IU] | PEN_INJECTOR | Freq: Two times a day (BID) | SUBCUTANEOUS | 3 refills | Status: DC
Start: 2023-05-04 — End: 2023-05-11

## 2023-05-04 MED ORDER — TOUJEO SOLOSTAR 300 UNIT/ML ~~LOC~~ SOPN
26.0000 [IU] | PEN_INJECTOR | Freq: Every day | SUBCUTANEOUS | 3 refills | Status: DC
Start: 2023-05-04 — End: 2023-10-18

## 2023-05-04 MED ORDER — DEXCOM G7 SENSOR MISC
1.0000 | Freq: Every day | 3 refills | Status: DC
Start: 2023-05-04 — End: 2023-11-17

## 2023-05-04 MED ORDER — INSULIN PEN NEEDLE 32G X 4 MM MISC: 3 refills | Status: AC

## 2023-05-04 NOTE — Progress Notes (Signed)
MEN patient ID: Sonia Skinner, female   DOB: Aug 27, 1950, 72 y.o.   MRN: [id number]  HPI: Sonia Skinner is a 72 y.o.-year-old female, returning for follow-up for DM2, dx in 1962, insulin-dependent since 2000, uncontrolled, with complications (CVA, left carotid stenosis, PAD, CKD stage IV, PN, history of hypoglycemia, history of nonketotic hyperglycemic state). Pt. previously saw Dr. Everardo All, but last visit with me 12/2022.  Interim history: No increased urination, blurry vision, nausea, chest pain. Constipation resolved. She has fatigue.  She is on Rexulti for depression. She was admitted 01/2023 with AKI + hyperkalemia due to using both Ozempic and [place].  At that time it was also noted that she had nausea/vomiting/poor oral intake and presyncope while on Ozempic.  She was taken off both Ozempic and [place].  Reviewed HbA1c: Lab Results  Component Value Date   HGBA1C 9.2 (H) 05/03/2023   HGBA1C 8.1 (A) 12/31/2022   HGBA1C 8.9 (H) 03/29/2022   HGBA1C 8.1 (H) 02/26/2022   HGBA1C 7.8 (H) 11/14/2021   HGBA1C 8.0 (A) 11/02/2021   HGBA1C 8.5 (H) 08/15/2021   HGBA1C 7.2 (H) 04/07/2021   HGBA1C 10.2 (H) 04/01/2020   HGBA1C 7.3 (A) 06/20/2019   Pt is on a regimen of: - Farxiga 10 mg before breakfast - started 08/2021 >> stopped by her nephrologist, Dr. Melanee Spry - Ozempic 2 mg weekly  >> stopped 01/2023 due to significantly decreased appetite "I could not eat anything" - Humalog 3-5 units 15 min before a meal with more carbs - Toujeo 30 >> 20-22 >> 26 units daily She was previously on Basaglar, then NPH.  Pt checks her sugars more than 4 times a day with her Dexcom CGM:    Previously:  Previously:  Lowest sugar was 40s >> 50s >> 40 >> 68; she has hypoglycemia awareness at 70.  Highest sugar was 300s >> 290 >> 300 >> 350.  In 2017, she had nonketotic hyperglycemic state.  Glucometer: Accu-Chek guide  - + CKD stage IV - sees Dr. Melanee Spry (nephrology), last BUN/creatinine:  Lab Results   Component Value Date   BUN 21 01/27/2023   BUN 16 01/21/2023   CREATININE 2.15 (H) 01/27/2023   CREATININE 1.38 (H) 01/21/2023   Lab Results  Component Value Date   MICRALBCREAT 3 11/02/2022   MICRALBCREAT 53 11/02/2022   MICRALBCREAT 0.9 03/29/2022   MICRALBCREAT 1.0 02/25/2022   MICRALBCREAT 1.1 04/07/2021   MICRALBCREAT 7 04/01/2020   MICRALBCREAT 1.3 02/20/2018   MICRALBCREAT 2.0 12/20/2016  Not on ACE inhibitor/ARB.  -+ HL; last set of lipids: Lab Results  Component Value Date   CHOL 115 05/03/2023   HDL 38.70 (L) 05/03/2023   LDLCALC 54 05/03/2023   LDLDIRECT 141 (H) 03/04/2010   TRIG 113.0 05/03/2023   CHOLHDL 3 05/03/2023  On Lipitor 40 mg daily. On Zetia 10 mg daily.  - last eye exam was 12/2022. No DR reportedly.   - no numbness and tingling in her feet.  Foot exam 05/03/2023.  She is on gabapentin 100 mg 3 times a day.  She has a history of multinodular goiter, diagnosed in 2017.  Latest thyroid ultrasound (11/09/2021) reviewed: Parenchymal Echotexture: Moderately heterogenous  Isthmus: Normal in size measuring 0.3 cm in diameter, unchanged  Right lobe: Normal in size measuring 5.0 x 1.8 x 1.0 cm, previously, 4.9 x 1.7 x 1.4 cm  Left lobe: Normal in size measuring 4.8 x 1.6 x 1.6 cm, previously, 5.3 x 2.0 x 1.9 cm _________________________________________________________   The approximately  1.2 x 1.0 x 0.9 cm isoechoic ill-defined nodule within the mid aspect the right lobe of the thyroid (labeled 1), is grossly unchanged compared to the 05/2016 examination, previously, 1.2 x 1.1 x 0.9 cm and is again noted to contain an internal partially shadowing macrocalcification. Imaging stability for greater than 5 years is indicative of a benign etiology.   The approximately 1.0 x 1.0 x 0.5 cm spongiform/benign-appearing nodule within the inferior, medial aspect of the right lobe of the thyroid (labeled 2), is grossly unchanged compared to the  05/2016 examination, previously, 1.0 cm, and again does not meet criteria to recommend percutaneous sampling or continued dedicated follow-up.  _________________________________________________________   The approximately 2.5 x 1.5 x 1.3 cm isoechoic nodule within mid aspect the left lobe of the thyroid (labeled 3), is unchanged compared to the 05/2016 examination, previously, 2.5 x 1.8 x 1.3 cm and is again noted to contain an internal partially shadowing macrocalcification. Imaging stability for greater than 5 years is indicative of a benign etiology.   IMPRESSION: 1. Similar findings of multinodular goiter. No worrisome new or enlarging thyroid nodules. 2. All discretely thyroid nodules are unchanged since the 05/2016 examination. By report, dominant bilateral thyroid nodules may have been biopsied at an outside institution. If so, correlation with previous biopsy results is advised. Otherwise, imaging stability for greater than 5 years is indicative of a benign etiology and as such percutaneous sampling and/or continued dedicated follow-up is not recommended.  Hypothyroidism:  She is on Synthroid 88 mcg daily: - in am - fasting - right before b'fast >> moved 30 minutes before breakfast - no calcium - no iron - no multivitamins - + PPIs  - Dexilant in am 30 min to 1h >> moved later in the day - not on Biotin  Reviewed most recent TFTs: Lab Results  Component Value Date   TSH 1.09 05/03/2023   TSH 0.368 01/19/2023   TSH 4.23 03/29/2022   TSH 3.75 02/25/2022   TSH 1.088 11/14/2021   TSH 11.69 (H) 11/02/2021   TSH 0.25 (L) 08/27/2021   TSH 0.053 (L) 08/15/2021   TSH 33.15 (H) 04/07/2021   TSH 4.06 04/01/2020   She is not on steroids or biotin.  She also has a history of GERD, headache, HTN, depression. She was admitted 08/12/2022 with SBO.  ROS: + see HPI Past Medical History:  Diagnosis Date   Ankle fracture    Left   Anxiety    Carotid artery occlusion     Closed fracture of left distal fibula 09/16/2017   Complication of anesthesia    ALLERY TO ESTER BASE   Depression    early 30s   Diabetes mellitus    INSULIN DEPENDENT   GERD (gastroesophageal reflux disease)    Headache    years ago   Hypertension    Pneumonia    Stroke [facility]) March 2015    left MCA infarct, slight weakness on left side   Thyroid disease    Past Surgical History:  Procedure Laterality Date   ABDOMINAL HYSTERECTOMY     ANTERIOR FIXATION AND POSTERIOR MICRODISCECTOMY CERVICAL SPINE  1999   CAROTID ENDARTERECTOMY Left 11-04-13   cea   ENDARTERECTOMY Left 11/04/2013   IR ANGIO INTRA EXTRACRAN SEL COM CAROTID INNOMINATE BILAT MOD SED  11/16/2021   IR ANGIO INTRA EXTRACRAN SEL COM CAROTID INNOMINATE UNI L MOD SED  11/19/2021   IR ANGIO VERTEBRAL SEL VERTEBRAL BILAT MOD SED  11/16/2021   IR CT HEAD LTD  11/19/2021   IR INTRAVSC STENT CERV CAROTID W/EMB-PROT MOD SED INCL ANGIO  11/19/2021   IR RADIOLOGIST EVAL & MGMT  12/13/2021   ORIF ANKLE FRACTURE Left 09/16/2017   Procedure: OPEN REDUCTION INTERNAL FIXATION (ORIF) LEFT ANKLE FRACTURE;  Surgeon: Teryl Lucy, MD;  Location: [facility] OR;  Service: Orthopedics;  Laterality: Left;   RADIOLOGY WITH ANESTHESIA N/A 11/19/2021   Procedure: Left carotid angioplasty with possible stenting;  Surgeon: Julieanne Cotton, MD;  Location: [facility] OR;  Service: Radiology;  Laterality: N/A;   Social History   Socioeconomic History   Marital status: Legally Separated    Spouse name: Not on file   Number of children: 0   Years of education: College   Highest education level: Not on file  Occupational History   Occupation: [employer]    Employer: [employer]  Tobacco Use   Smoking status: Former    Current packs/day: 0.00    Average packs/day: 1 pack/day for 20.0 years (20.0 ttl pk-yrs)    Types: Cigarettes    Start date: 11/01/1993    Quit date: 11/01/2013    Years since quitting: 9.5   Smokeless tobacco: Never  Vaping Use   Vaping status: Never  Used  Substance and Sexual Activity   Alcohol use: Not Currently    Alcohol/week: 1.0 - 2.0 standard drink of alcohol    Types: 1 - 2 Standard drinks or equivalent per week   Drug use: No   Sexual activity: Not Currently  Other Topics Concern   Not on file  Social History Narrative   Patient lives in 1 story home with sister.   Caffeine Use: 2 cups daily; sodas occasionally   Some college education   Works as Lawyer for [employer]   Social Determinants of Health   Financial Resource Strain: Low Risk  (01/28/2022)   Overall Financial Resource Strain (CARDIA)    Difficulty of Paying Living Expenses: Not hard at all  Food Insecurity: No Food Insecurity (01/24/2023)   Hunger Vital Sign    Worried About Running Out of Food in the Last Year: Never true    Ran Out of Food in the Last Year: Never true  Transportation Needs: No Transportation Needs (01/24/2023)   PRAPARE - Administrator, Civil Service (Medical): No    Lack of Transportation (Non-Medical): No  Physical Activity: Insufficiently Active (01/28/2022)   Exercise Vital Sign    Days of Exercise per Week: 1 day    Minutes of Exercise per Session: 30 min  Stress: No Stress Concern Present (01/28/2022)   [employer] of Occupational Health - Occupational Stress Questionnaire    Feeling of Stress : Not at all  Social Connections: Moderately Integrated (01/28/2022)   Social Connection and Isolation Panel [NHANES]    Frequency of Communication with Friends and Family: More than three times a week    Frequency of Social Gatherings with Friends and Family: More than three times a week    Attends Religious Services: More than 4 times per year    Active Member of [employer] or Organizations: Yes    Attends Banker Meetings: More than 4 times per year    Marital Status: Separated  Intimate Partner Violence: Not At Risk (01/19/2023)   Humiliation, Afraid, Rape, and Kick questionnaire    Fear of Current or  Ex-Partner: No    Emotionally Abused: No    Physically Abused: No    Sexually Abused: No   Current Outpatient Medications on  File Prior to Visit  Medication Sig Dispense Refill   Accu-Chek FastClix Lancets MISC Use to check blood sugar five times daily. 510 each 2   ACCU-CHEK GUIDE test strip Use to check blood sugar twice a day. E11.9 100 each 2   amLODipine (NORVASC) 10 MG tablet Take 1 tablet (10 mg total) by mouth daily. (Patient taking differently: Take 5 mg by mouth daily.) 90 tablet 3   aspirin 81 MG chewable tablet Chew 1 tablet (81 mg total) by mouth daily. 30 tablet 1   atorvastatin (LIPITOR) 40 MG tablet Take 1 tablet by mouth daily. 90 tablet 0   benztropine (COGENTIN) 0.5 MG tablet Take 1 tablet (0.5 mg total) by mouth at bedtime. 90 tablet 0   brexpiprazole (REXULTI) 1 MG TABS tablet Take 1 tablet (1 mg total) by mouth daily. 90 tablet 0   buPROPion (WELLBUTRIN XL) 300 MG 24 hr tablet Take 1 tablet (300 mg total) by mouth daily. 90 tablet 0   Continuous Blood Gluc Receiver (DEXCOM G7 RECEIVER) DEVI 1 Act by Does not apply route daily. 2 each 5   Continuous Blood Gluc Sensor (DEXCOM G7 SENSOR) MISC 1 Act by Does not apply route daily. 2 each 5   Continuous Blood Gluc Sensor (DEXCOM G7 SENSOR) MISC USE AS DIRECTED TOPICALLY  EVERY  10  DAYS 18 each 1   Continuous Glucose Sensor (DEXCOM G7 SENSOR) MISC 1 Act by Does not apply route daily. 2 each 5   dexlansoprazole (DEXILANT) 60 MG capsule Take 1 capsule by mouth daily. 90 capsule 0   ezetimibe (ZETIA) 10 MG tablet Take 1 tablet by mouth once daily. 90 tablet 1   gabapentin (NEURONTIN) 100 MG capsule Take 1 capsule (100 mg total) by mouth 3 (three) times daily. 270 capsule 1   HUMALOG KWIKPEN 200 UNIT/ML KwikPen Inject 5 units under the skin three times daily with meals. 9 mL 0   HYDROcodone-acetaminophen (NORCO) 5-325 MG tablet Take 1 tablet by mouth every 6 (six) hours as needed for moderate pain. 100 tablet 0   insulin glargine,  1 Unit Dial, (TOUJEO SOLOSTAR) 300 UNIT/ML Solostar Pen Inject 20-22 Units into the skin daily. 18 mL 3   levothyroxine (SYNTHROID) 88 MCG tablet Take 1 tablet by mouth daily. 90 tablet 1   lubiprostone (AMITIZA) 24 MCG capsule Take 1 capsule (24 mcg total) by mouth 2 (two) times daily with a meal. 180 capsule 1   traZODone (DESYREL) 50 MG tablet Take 1 tablet (50 mg total) by mouth at bedtime as needed for sleep. 90 tablet 0   No current facility-administered medications on file prior to visit.   Allergies  Allergen Reactions   Anesthetics, Ester Anaphylaxis   Family History  Problem Relation Age of Onset   Diabetes Mother    Heart disease Mother        Before age 78   Cancer Father        Lung   Hypertension Sister    Diabetes Sister    Diabetes Sister    Diabetes Sister    PE: BP 130/60 (BP Location: Left Arm, Patient Position: Sitting, Cuff Size: Normal)   Pulse 77   Resp 16   Ht 5\' 2"  (1.575 m)   Wt 127 lb 12.8 oz (58 kg)   SpO2 99%   BMI 23.37 kg/m  Wt Readings from Last 3 Encounters:  05/04/23 127 lb 12.8 oz (58 kg)  05/03/23 127 lb (57.6 kg)  01/27/23 116 lb (  52.6 kg)   Constitutional: normal weight, in NAD Eyes: EOMI, no exophthalmos ENT: no thyromegaly, no cervical lymphadenopathy Cardiovascular: RRR, No MRG Respiratory: CTA B Musculoskeletal: no deformities Skin: moist, warm, no rashes Neurological: no tremor with outstretched hands  ASSESSMENT: 1. DM2, insulin-dependent, uncontrolled, with complications - CVA - carotid stenosis - PAD - CKD stage 4 - PN - Hypoglycemia - Nonketotic hyperglycemic state in 2017  2.  Hypothyroidism  3.  Multinodular goiter  PLAN:  1. Patient with longstanding, uncontrolled, type 2 diabetes, on oral antidiabetic regimen with SGLT2 inhibitor, also weekly GLP-1 receptor agonist and basal-bolus insulin, with improving control at last visit.  At that time, HbA1c was 8.1%, lower.  Sugars appeared to be dropping too much  overnight, with lows in the morning and then increasing blood sugars after meals.  However, the majority of the blood sugars were fluctuating within the target range.  Upon questioning, patient was taking a higher dose of Toujeo than recommended so we decreased this while increasing slightly the dose of her Humalog. -Yesterday, she saw PCP and had another HbA1c which was higher, at 9.2%.  Of note, she is currently off Ozempic and [place] after being admitted with AKI and hyperkalemia in the setting of N/V/poor oral intake and dehydration. CGM interpretation:  -At today's visit, we reviewed her CGM downloads: It appears that 59% of values are in target range (goal >70%), while 41% are higher than 180 (goal <25%), and 0% are lower than 70 (goal <4%).  The calculated average blood sugar is 177.  The projected HbA1c for the next 3 months (GMI) is 7.5%. -Reviewing the CGM trends, sugars have improved in the last 2 weeks compared to the previous 2 weeks but they still increase after her 2 meals of the day, up to 250s and sometimes even higher, to 300s.  Upon questioning, she is taking a low-dose Humalog after the meal.  We discussed about increasing the dose slightly and strongly advised her again to take it 15 minutes before the meals.  She did increase her Toujeo dose slightly since last visit and I advised her to continue this.  Also, we discussed about possibly restarting a low-dose Ozempic.  She agrees with this.  If her appetite decreases considerably, I advised her to let me know, and we may need to stop the medication at that time. - I suggested to:  Patient Instructions  Please increase: - Humalog 5-7 units 15 min before meals  Restart:  - Ozempic 0.25 mg weekly in a.m. (for example on Sunday morning) x 1-2 weeks, then increase to 0.5 mg weekly in a.m. if no nausea or hypoglycemia.  Continue: - Toujeo 26 units daily  Please continue levothyroxine 88 mcg daily.  Take the thyroid hormone every  day, with water, at least 30 minutes before breakfast, separated by at least 4 hours from: - acid reflux medications - calcium - iron - multivitamins  Please return in 4 months.  - advised to check sugars at different times of the day - 4x a day, rotating check times - advised for yearly eye exams >> she is UTD - return to clinic in 4 months  2.  Hypothyroidism - latest thyroid labs reviewed with pt. >> normal - yesterday: Lab Results  Component Value Date   TSH 1.09 05/03/2023  - she continues on LT4 88 mcg daily - pt feels good on this dose. - we discussed about taking the thyroid hormone every day, with water, >30 minutes before  breakfast, separated by >4 hours from acid reflux medications, calcium, iron, multivitamins. Pt. is taking it correctly.  3.  Multinodular goiter -She denies neck compression symptoms -Most recent thyroid ultrasound showed stability of her nodules over 6 years -No further imaging is needed for this  Carlus Pavlov, MD PhD [facility]

## 2023-05-04 NOTE — Patient Instructions (Addendum)
Please increase: - Humalog 5-7 units 15 min before meals  Restart:  - Ozempic 0.25 mg weekly in a.m. (for example on Sunday morning) x 1-2 weeks, then increase to 0.5 mg weekly in a.m. if no nausea or hypoglycemia.  Continue: - Toujeo 26 units daily  Please continue levothyroxine 88 mcg daily.  Take the thyroid hormone every day, with water, at least 30 minutes before breakfast, separated by at least 4 hours from: - acid reflux medications - calcium - iron - multivitamins  Please return in 4 months.

## 2023-05-05 ENCOUNTER — Telehealth: Payer: Self-pay

## 2023-05-05 ENCOUNTER — Other Ambulatory Visit (HOSPITAL_COMMUNITY): Payer: Self-pay

## 2023-05-05 NOTE — Telephone Encounter (Signed)
Pharmacy Patient Advocate Encounter   Received notification from CoverMyMeds that prior authorization for HYDROcodone-Acetaminophen is required/requested.   Insurance verification completed.   The patient is insured through [facility] .   Per test claim: PA required; PA submitted to [facility] via CoverMyMeds Key/confirmation #/EOC Key: [id number] Status is pending

## 2023-05-09 ENCOUNTER — Other Ambulatory Visit (HOSPITAL_COMMUNITY): Payer: Self-pay | Admitting: Psychiatry

## 2023-05-09 ENCOUNTER — Other Ambulatory Visit (HOSPITAL_COMMUNITY): Payer: Self-pay

## 2023-05-09 DIAGNOSIS — F331 Major depressive disorder, recurrent, moderate: Secondary | ICD-10-CM

## 2023-05-09 NOTE — Telephone Encounter (Signed)
Pharmacy Patient Advocate Encounter  Received notification from [facility] that Prior Authorization for HYDROcodone-Acetaminophenhas been APPROVED from 9.19.24 to 10.19.24. Ran test claim, and the rx has now been filled and is payable again on/after 05/27/23.    This test claim was processed through [facility]- copay amounts may vary at other pharmacies due to pharmacy/plan contracts, or as the patient moves through the different stages of their insurance plan.   PA #/Case ID/Reference #: Key: [id number]

## 2023-05-11 ENCOUNTER — Other Ambulatory Visit: Payer: Self-pay | Admitting: Internal Medicine

## 2023-05-11 DIAGNOSIS — E119 Type 2 diabetes mellitus without complications: Secondary | ICD-10-CM

## 2023-05-11 DIAGNOSIS — K2100 Gastro-esophageal reflux disease with esophagitis, without bleeding: Secondary | ICD-10-CM

## 2023-05-11 DIAGNOSIS — E785 Hyperlipidemia, unspecified: Secondary | ICD-10-CM

## 2023-05-11 MED ORDER — HUMALOG KWIKPEN 200 UNIT/ML ~~LOC~~ SOPN: 5.0000 [IU] | PEN_INJECTOR | Freq: Two times a day (BID) | SUBCUTANEOUS | 0 refills | Status: DC

## 2023-05-11 MED ORDER — DEXLANSOPRAZOLE 30 MG PO CPDR
30.0000 mg | DELAYED_RELEASE_CAPSULE | Freq: Every day | ORAL | 0 refills | Status: DC
Start: 2023-05-11 — End: 2023-11-01

## 2023-05-11 MED ORDER — ATORVASTATIN CALCIUM 40 MG PO TABS
40.0000 mg | ORAL_TABLET | Freq: Every day | ORAL | 0 refills | Status: DC
Start: 2023-05-11 — End: 2023-08-31

## 2023-05-13 ENCOUNTER — Telehealth: Payer: Self-pay | Admitting: Dietician

## 2023-05-13 ENCOUNTER — Other Ambulatory Visit: Payer: Self-pay

## 2023-05-13 DIAGNOSIS — Z794 Long term (current) use of insulin: Secondary | ICD-10-CM

## 2023-05-13 MED ORDER — SEMAGLUTIDE(0.25 OR 0.5MG/DOS) 2 MG/3ML ~~LOC~~ SOPN
0.2500 [IU] | PEN_INJECTOR | SUBCUTANEOUS | 0 refills | Status: DC
Start: 2023-05-13 — End: 2023-08-05

## 2023-05-13 NOTE — Telephone Encounter (Signed)
Ozempic 0.25 has been sent to [facility] on [address]

## 2023-05-13 NOTE — Telephone Encounter (Signed)
Patient called.  She need a prescription for 0.25 mg Ozempic recommended to restart with Dr. Elvera Lennox on 04/29/2023.  Will send a request to the [place] pool.  Oran Rein, RD, LDN, CDCES

## 2023-05-18 ENCOUNTER — Ambulatory Visit (INDEPENDENT_AMBULATORY_CARE_PROVIDER_SITE_OTHER): Payer: 59 | Admitting: *Deleted

## 2023-05-18 VITALS — Ht 62.0 in | Wt 127.0 lb

## 2023-05-18 DIAGNOSIS — Z780 Asymptomatic menopausal state: Secondary | ICD-10-CM

## 2023-05-18 DIAGNOSIS — Z0000 Encounter for general adult medical examination without abnormal findings: Secondary | ICD-10-CM

## 2023-05-18 DIAGNOSIS — Z1231 Encounter for screening mammogram for malignant neoplasm of breast: Secondary | ICD-10-CM

## 2023-05-18 NOTE — Progress Notes (Deleted)
Subjective:   Sonia Skinner is a 72 y.o. female who presents for Medicare Annual (Subsequent) preventive examination.  Visit Complete: Virtual  I connected with  [name] on 05/18/23 by a audio enabled telemedicine application and verified that I am speaking with the correct person using two identifiers.  Patient Location: Home at Hospital with sister  Provider Location: Office/Clinic  I discussed the limitations of evaluation and management by telemedicine. The patient expressed understanding and agreed to proceed.  Because this visit was a virtual/telehealth visit, some criteria may be missing or patient reported. Any vitals not documented were not able to be obtained and vitals that have been documented are patient reported.    Cardiac Risk Factors include: advanced age (>68men, >66 women);diabetes mellitus;dyslipidemia;hypertension     Objective:    Today's Vitals   05/18/23 1250  Weight: 127 lb (57.6 kg)  Height: 5\' 2"  (1.575 m)   Body mass index is 23.23 kg/m.     05/18/2023   12:59 PM 02/16/2023    3:00 PM 01/18/2023    6:14 PM 09/03/2022   12:26 PM 08/12/2022    6:06 PM 02/18/2022    1:01 PM 02/17/2022    4:55 AM  Advanced Directives  Does Patient Have a Medical Advance Directive? No No No No No No No  Would patient like information on creating a medical advance directive? No - Patient declined No - Patient declined No - Patient declined  No - Patient declined      Current Medications (verified) Outpatient Encounter Medications as of 05/18/2023  Medication Sig   Accu-Chek FastClix Lancets MISC Use to check blood sugar five times daily.   ACCU-CHEK GUIDE test strip Use to check blood sugar twice a day. E11.9   amLODipine (NORVASC) 10 MG tablet Take 1 tablet (10 mg total) by mouth daily. (Patient taking differently: Take 5 mg by mouth daily.)   aspirin 81 MG chewable tablet Chew 1 tablet (81 mg total) by mouth daily.   atorvastatin (LIPITOR) 40 MG tablet Take 1 tablet  (40 mg total) by mouth daily.   benztropine (COGENTIN) 0.5 MG tablet Take 1 tablet (0.5 mg total) by mouth at bedtime.   brexpiprazole (REXULTI) 1 MG TABS tablet Take 1 tablet (1 mg total) by mouth daily.   buPROPion (WELLBUTRIN XL) 300 MG 24 hr tablet Take 1 tablet (300 mg total) by mouth daily.   Continuous Blood Gluc Receiver (DEXCOM G7 RECEIVER) DEVI 1 Act by Does not apply route daily.   Continuous Glucose Sensor (DEXCOM G7 SENSOR) MISC 1 Act by Does not apply route daily.   Dexlansoprazole 30 MG capsule DR Take 1 capsule (30 mg total) by mouth daily.   ezetimibe (ZETIA) 10 MG tablet Take 1 tablet by mouth once daily.   gabapentin (NEURONTIN) 100 MG capsule Take 1 capsule (100 mg total) by mouth 3 (three) times daily.   HYDROcodone-acetaminophen (NORCO) 5-325 MG tablet Take 1 tablet by mouth every 6 (six) hours as needed for moderate pain.   insulin glargine, 1 Unit Dial, (TOUJEO SOLOSTAR) 300 UNIT/ML Solostar Pen Inject 26 Units into the skin daily.   insulin lispro (HUMALOG KWIKPEN) 200 UNIT/ML KwikPen Inject 5-8 Units into the skin 2 (two) times daily before a meal.   Insulin Pen Needle 32G X 4 MM MISC Use 3x a day   levothyroxine (SYNTHROID) 88 MCG tablet Take 1 tablet by mouth daily.   lubiprostone (AMITIZA) 24 MCG capsule Take 1 capsule (24 mcg total)  by mouth 2 (two) times daily with a meal.   Semaglutide,0.25 or 0.5MG /DOS, 2 MG/3ML SOPN Inject 0.25 Units into the skin once a week.   traZODone (DESYREL) 50 MG tablet Take 1 tablet (50 mg total) by mouth at bedtime as needed for sleep.   No facility-administered encounter medications on file as of 05/18/2023.    Allergies (verified) Anesthetics, ester   History: Past Medical History:  Diagnosis Date   Ankle fracture    Left   Anxiety    Carotid artery occlusion    Closed fracture of left distal fibula 09/16/2017   Complication of anesthesia    ALLERY TO ESTER BASE   Depression    early 30s   Diabetes mellitus    INSULIN  DEPENDENT   GERD (gastroesophageal reflux disease)    Headache    years ago   Hypertension    Pneumonia    Stroke [facility]) March 2015    left MCA infarct, slight weakness on left side   Thyroid disease    Past Surgical History:  Procedure Laterality Date   ABDOMINAL HYSTERECTOMY     ANTERIOR FIXATION AND POSTERIOR MICRODISCECTOMY CERVICAL SPINE  1999   CAROTID ENDARTERECTOMY Left 11-04-13   cea   ENDARTERECTOMY Left 11/04/2013   IR ANGIO INTRA EXTRACRAN SEL COM CAROTID INNOMINATE BILAT MOD SED  11/16/2021   IR ANGIO INTRA EXTRACRAN SEL COM CAROTID INNOMINATE UNI L MOD SED  11/19/2021   IR ANGIO VERTEBRAL SEL VERTEBRAL BILAT MOD SED  11/16/2021   IR CT HEAD LTD  11/19/2021   IR INTRAVSC STENT CERV CAROTID W/EMB-PROT MOD SED INCL ANGIO  11/19/2021   IR RADIOLOGIST EVAL & MGMT  12/13/2021   ORIF ANKLE FRACTURE Left 09/16/2017   Procedure: OPEN REDUCTION INTERNAL FIXATION (ORIF) LEFT ANKLE FRACTURE;  Surgeon: Teryl Lucy, MD;  Location: [facility] OR;  Service: Orthopedics;  Laterality: Left;   RADIOLOGY WITH ANESTHESIA N/A 11/19/2021   Procedure: Left carotid angioplasty with possible stenting;  Surgeon: Julieanne Cotton, MD;  Location: [facility] OR;  Service: Radiology;  Laterality: N/A;   Family History  Problem Relation Age of Onset   Diabetes Mother    Heart disease Mother        Before age 3   Cancer Father        Lung   Hypertension Sister    Diabetes Sister    Diabetes Sister    Diabetes Sister    Social History   Socioeconomic History   Marital status: Legally Separated    Spouse name: Not on file   Number of children: 0   Years of education: College   Highest education level: Not on file  Occupational History   Occupation: [employer]    Employer: [employer]  Tobacco Use   Smoking status: Former    Current packs/day: 0.00    Average packs/day: 1 pack/day for 20.0 years (20.0 ttl pk-yrs)    Types: Cigarettes    Start date: 11/01/1993    Quit date: 11/01/2013    Years since quitting: 9.5    Smokeless tobacco: Never  Vaping Use   Vaping status: Never Used  Substance and Sexual Activity   Alcohol use: Not Currently    Alcohol/week: 1.0 - 2.0 standard drink of alcohol    Types: 1 - 2 Standard drinks or equivalent per week   Drug use: No   Sexual activity: Not Currently  Other Topics Concern   Not on file  Social History Narrative   Patient lives  in 1 story home with sister.   Caffeine Use: 2 cups daily; sodas occasionally   Some college education   Works as Lawyer for [employer]   Social Determinants of Health   Financial Resource Strain: Low Risk  (05/18/2023)   Overall Financial Resource Strain (CARDIA)    Difficulty of Paying Living Expenses: Not hard at all  Food Insecurity: No Food Insecurity (05/18/2023)   Hunger Vital Sign    Worried About Running Out of Food in the Last Year: Never true    Ran Out of Food in the Last Year: Never true  Transportation Needs: No Transportation Needs (05/18/2023)   PRAPARE - Administrator, Civil Service (Medical): No    Lack of Transportation (Non-Medical): No  Physical Activity: Insufficiently Active (05/18/2023)   Exercise Vital Sign    Days of Exercise per Week: 4 days    Minutes of Exercise per Session: 30 min  Stress: No Stress Concern Present (05/18/2023)   [employer] of Occupational Health - Occupational Stress Questionnaire    Feeling of Stress : Only a little  Social Connections: Moderately Integrated (05/18/2023)   Social Connection and Isolation Panel [NHANES]    Frequency of Communication with Friends and Family: More than three times a week    Frequency of Social Gatherings with Friends and Family: More than three times a week    Attends Religious Services: More than 4 times per year    Active Member of [employer] or Organizations: Yes    Attends Engineer, structural: More than 4 times per year    Marital Status: Separated    Tobacco Counseling Counseling given: Not  Answered   Clinical Intake:  Pre-visit preparation completed: Yes  Pain : No/denies pain     BMI - recorded: 23.23 Nutritional Status: BMI of 19-24  Normal Nutritional Risks: None Diabetes: Yes CBG done?: Yes ([place] 221)  How often do you need to have someone help you when you read instructions, pamphlets, or other written materials from your doctor or pharmacy?: 1 - Never  Interpreter Needed?: No  Information entered by :: R. Robertlee Rogacki LPN   Activities of Daily Living    05/18/2023   12:52 PM 01/19/2023   12:37 AM  In your present state of health, do you have any difficulty performing the following activities:  Hearing? 1   Comment aids   Vision? 0   Comment glasses   Difficulty concentrating or making decisions? 0   Walking or climbing stairs? 0   Dressing or bathing? 0   Doing errands, shopping? 0 0  Preparing Food and eating ? N   Using the Toilet? N   In the past six months, have you accidently leaked urine? N   Do you have problems with loss of bowel control? N   Managing your Medications? N   Managing your Finances? N   Housekeeping or managing your Housekeeping? N     Patient Care Team: Etta Grandchild, MD as PCP - General (Internal Medicine) Lolly Mustache Phillips Grout, MD as Consulting Physician (Psychiatry) Genia Del Daisy Blossom, MD as Consulting Physician (Ophthalmology) Meriam Sprague, MD as Consulting Physician (Cardiology)  Indicate any recent Medical Services you may have received from other than [facility] providers in the past year (date may be approximate).     Assessment:   This is a routine wellness examination for [facility].  Hearing/Vision screen Hearing Screening - Comments:: Wears aids Vision Screening - Comments:: glasses   Goals Addressed  This Visit's Progress    Patient Stated       Continue doing as she is now        Depression Screen    05/18/2023   12:55 PM 01/27/2023   10:58 AM 03/29/2022   10:08 AM 02/25/2022    1:05 PM  01/28/2022   11:36 AM 01/19/2022   11:41 AM 12/16/2021    1:15 PM  PHQ 2/9 Scores  PHQ - 2 Score 1 2 2 2  0 4 1  PHQ- 9 Score 3 9 14 9  0 16     Fall Risk    05/18/2023   12:54 PM 03/29/2022   10:08 AM 02/25/2022    1:05 PM 02/18/2022    1:01 PM 01/28/2022   11:39 AM  Fall Risk   Falls in the past year? 0 1 1 1  0  Number falls in past yr: 0 0 0 0 0  Injury with Fall? 0 0 1 1 0  Risk for fall due to : No Fall Risks Other (Comment)   No Fall Risks  Follow up Falls prevention discussed;Falls evaluation completed Falls evaluation completed       MEDICARE RISK AT HOME: Medicare Risk at Home Any stairs in or around the home?: No If so, are there any without handrails?: No Home free of loose throw rugs in walkways, pet beds, electrical cords, etc?: Yes Adequate lighting in your home to reduce risk of falls?: Yes Life alert?: No Use of a cane, walker or w/c?: No Grab bars in the bathroom?: No Shower chair or bench in shower?: No Elevated toilet seat or a handicapped toilet?: No    Cognitive Function:    03/03/2018    1:00 PM  MMSE - Mini Mental State Exam  Orientation to time 5  Orientation to Place 5  Registration 3  Attention/ Calculation 5  Recall 1  Language- name 2 objects 2  Language- repeat 1  Language- follow 3 step command 3  Language- read & follow direction 1  Write a sentence 1  Copy design 1  Total score 28        05/18/2023   12:59 PM 01/28/2022   11:40 AM  6CIT Screen  What Year? 0 points 0 points  What month? 0 points 0 points  What time? 0 points 0 points  Count back from 20 0 points 0 points  Months in reverse 0 points 0 points  Repeat phrase 2 points 0 points  Total Score 2 points 0 points    Immunizations Immunization History  Administered Date(s) Administered   Fluad Quad(high Dose 65+) 04/06/2019   Influenza Split 05/17/2013   Influenza, High Dose Seasonal PF 05/25/2018, 05/25/2022   Influenza,inj,Quad PF,6+ Mos 05/08/2014, 05/13/2015,  06/29/2017, 04/24/2020   Influenza-Unspecified 07/27/2021   PFIZER(Purple Top)SARS-COV-2 Vaccination 09/22/2019, 10/13/2019, 05/18/2020, 02/05/2021   Pneumococcal Conjugate-13 12/20/2016   Pneumococcal Polysaccharide-23 11/02/2013, 04/06/2019   Tdap 11/24/2016    TDAP status: Up to date  Flu Vaccine status: Due, Education has been provided regarding the importance of this vaccine. Advised may receive this vaccine at local pharmacy or Health Dept. Aware to provide a copy of the vaccination record if obtained from local pharmacy or Health Dept. Verbalized acceptance and understanding.  Pneumococcal vaccine status: Up to date  Covid-19 vaccine status: Information provided on how to obtain vaccines.   Qualifies for Shingles Vaccine? Yes   Zostavax completed No   Shingrix Completed?: No.    Education has been provided regarding the  importance of this vaccine. Patient has been advised to call insurance company to determine out of pocket expense if they have not yet received this vaccine. Advised may also receive vaccine at local pharmacy or Health Dept. Verbalized acceptance and understanding.  Screening Tests Health Maintenance  Topic Date Due   Lung Cancer Screening  Never done   COVID-19 Vaccine (5 - 2023-24 season) 04/17/2023   Zoster Vaccines- Shingrix (1 of 2) 08/02/2023 (Originally 07/24/1999)   INFLUENZA VACCINE  11/14/2023 (Originally 03/17/2023)   MAMMOGRAM  06/19/2023   HEMOGLOBIN A1C  10/31/2023   Diabetic kidney evaluation - eGFR measurement  04/19/2024   Diabetic kidney evaluation - Urine ACR  04/19/2024   OPHTHALMOLOGY EXAM  04/19/2024   FOOT EXAM  05/02/2024   Colonoscopy  11/24/2025   DTaP/Tdap/Td (2 - Td or Tdap) 11/25/2026   Pneumonia Vaccine 79+ Years old  Completed   DEXA SCAN  Completed   Hepatitis C Screening  Completed   HPV VACCINES  Aged Out    Health Maintenance  Health Maintenance Due  Topic Date Due   Lung Cancer Screening  Never done   COVID-19  Vaccine (5 - 2023-24 season) 04/17/2023    Colorectal cancer screening: Type of screening: Colonoscopy. Completed 11/2015. Repeat every 10 years  Mammogram status: Completed 06/2021. Repeat every year order placed  Bone Density status: Completed 04/2009. Results reflect: Bone density results: OSTEOPOROSIS. Repeat every 2 years. Order placed  Lung Cancer Screening: (Low Dose CT Chest recommended if Age 65-80 years, 20 pack-year currently smoking OR have quit w/in 15years.) does qualify.  Order placed 05/03/2023    Additional Screening:  Hepatitis C Screening: does qualify; Completed 12/2016  Vision Screening: Recommended annual ophthalmology exams for early detection of glaucoma and other disorders of the eye. Is the patient up to date with their annual eye exam?  Yes  Who is the provider or what is the name of the office in which the patient attends annual eye exams? [facility] If pt is not established with a provider, would they like to be referred to a provider to establish care? No .   Dental Screening: Recommended annual dental exams for proper oral hygiene  Diabetic Foot Exam: Diabetic Foot Exam: Completed 04/2023  Community Resource Referral / Chronic Care Management: CRR required this visit?  No   CCM required this visit?  No     Plan:     I have personally reviewed and noted the following in the patient's chart:   Medical and social history Use of alcohol, tobacco or illicit drugs  Current medications and supplements including opioid prescriptions. Patient is currently taking opioid prescriptions. Information provided to patient regarding non-opioid alternatives. Patient advised to discuss non-opioid treatment plan with their provider. Functional ability and status Nutritional status Physical activity Advanced directives List of other physicians Hospitalizations, surgeries, and ER visits in previous 12 months Vitals Screenings to include cognitive,  depression, and falls Referrals and appointments  In addition, I have reviewed and discussed with patient certain preventive protocols, quality metrics, and best practice recommendations. A written personalized care plan for preventive services as well as general preventive health recommendations were provided to patient.     Sydell Axon, LPN   16/08/958   After Visit Summary: (MyChart) Due to this being a telephonic visit, the after visit summary with patients personalized plan was offered to patient via MyChart   Nurse Notes: None

## 2023-05-18 NOTE — Progress Notes (Signed)
Subjective:   Sonia Skinner is a 72 y.o. female who presents for Medicare Annual (Subsequent) preventive examination.  Visit Complete: Virtual  I connected with  [name] on 05/18/23 by a audio enabled telemedicine application and verified that I am speaking with the correct person using two identifiers.  Patient Location: Home at Hospital with sister  Provider Location: Office/Clinic  I discussed the limitations of evaluation and management by telemedicine. The patient expressed understanding and agreed to proceed.  Because this visit was a virtual/telehealth visit, some criteria may be missing or patient reported. Any vitals not documented were not able to be obtained and vitals that have been documented are patient reported.    Cardiac Risk Factors include: advanced age (>68men, >66 women);diabetes mellitus;dyslipidemia;hypertension     Objective:    Today's Vitals   05/18/23 1250  Weight: 127 lb (57.6 kg)  Height: 5\' 2"  (1.575 m)   Body mass index is 23.23 kg/m.     05/18/2023   12:59 PM 02/16/2023    3:00 PM 01/18/2023    6:14 PM 09/03/2022   12:26 PM 08/12/2022    6:06 PM 02/18/2022    1:01 PM 02/17/2022    4:55 AM  Advanced Directives  Does Patient Have a Medical Advance Directive? No No No No No No No  Would patient like information on creating a medical advance directive? No - Patient declined No - Patient declined No - Patient declined  No - Patient declined      Current Medications (verified) Outpatient Encounter Medications as of 05/18/2023  Medication Sig   Accu-Chek FastClix Lancets MISC Use to check blood sugar five times daily.   ACCU-CHEK GUIDE test strip Use to check blood sugar twice a day. E11.9   amLODipine (NORVASC) 10 MG tablet Take 1 tablet (10 mg total) by mouth daily. (Patient taking differently: Take 5 mg by mouth daily.)   aspirin 81 MG chewable tablet Chew 1 tablet (81 mg total) by mouth daily.   atorvastatin (LIPITOR) 40 MG tablet Take 1 tablet  (40 mg total) by mouth daily.   benztropine (COGENTIN) 0.5 MG tablet Take 1 tablet (0.5 mg total) by mouth at bedtime.   brexpiprazole (REXULTI) 1 MG TABS tablet Take 1 tablet (1 mg total) by mouth daily.   buPROPion (WELLBUTRIN XL) 300 MG 24 hr tablet Take 1 tablet (300 mg total) by mouth daily.   Continuous Blood Gluc Receiver (DEXCOM G7 RECEIVER) DEVI 1 Act by Does not apply route daily.   Continuous Glucose Sensor (DEXCOM G7 SENSOR) MISC 1 Act by Does not apply route daily.   Dexlansoprazole 30 MG capsule DR Take 1 capsule (30 mg total) by mouth daily.   ezetimibe (ZETIA) 10 MG tablet Take 1 tablet by mouth once daily.   gabapentin (NEURONTIN) 100 MG capsule Take 1 capsule (100 mg total) by mouth 3 (three) times daily.   HYDROcodone-acetaminophen (NORCO) 5-325 MG tablet Take 1 tablet by mouth every 6 (six) hours as needed for moderate pain.   insulin glargine, 1 Unit Dial, (TOUJEO SOLOSTAR) 300 UNIT/ML Solostar Pen Inject 26 Units into the skin daily.   insulin lispro (HUMALOG KWIKPEN) 200 UNIT/ML KwikPen Inject 5-8 Units into the skin 2 (two) times daily before a meal.   Insulin Pen Needle 32G X 4 MM MISC Use 3x a day   levothyroxine (SYNTHROID) 88 MCG tablet Take 1 tablet by mouth daily.   lubiprostone (AMITIZA) 24 MCG capsule Take 1 capsule (24 mcg total)  by mouth 2 (two) times daily with a meal.   Semaglutide,0.25 or 0.5MG /DOS, 2 MG/3ML SOPN Inject 0.25 Units into the skin once a week.   traZODone (DESYREL) 50 MG tablet Take 1 tablet (50 mg total) by mouth at bedtime as needed for sleep.   No facility-administered encounter medications on file as of 05/18/2023.    Allergies (verified) Anesthetics, ester   History: Past Medical History:  Diagnosis Date   Ankle fracture    Left   Anxiety    Carotid artery occlusion    Closed fracture of left distal fibula 09/16/2017   Complication of anesthesia    ALLERY TO ESTER BASE   Depression    early 30s   Diabetes mellitus    INSULIN  DEPENDENT   GERD (gastroesophageal reflux disease)    Headache    years ago   Hypertension    Pneumonia    Stroke [facility]) March 2015    left MCA infarct, slight weakness on left side   Thyroid disease    Past Surgical History:  Procedure Laterality Date   ABDOMINAL HYSTERECTOMY     ANTERIOR FIXATION AND POSTERIOR MICRODISCECTOMY CERVICAL SPINE  1999   CAROTID ENDARTERECTOMY Left 11-04-13   cea   ENDARTERECTOMY Left 11/04/2013   IR ANGIO INTRA EXTRACRAN SEL COM CAROTID INNOMINATE BILAT MOD SED  11/16/2021   IR ANGIO INTRA EXTRACRAN SEL COM CAROTID INNOMINATE UNI L MOD SED  11/19/2021   IR ANGIO VERTEBRAL SEL VERTEBRAL BILAT MOD SED  11/16/2021   IR CT HEAD LTD  11/19/2021   IR INTRAVSC STENT CERV CAROTID W/EMB-PROT MOD SED INCL ANGIO  11/19/2021   IR RADIOLOGIST EVAL & MGMT  12/13/2021   ORIF ANKLE FRACTURE Left 09/16/2017   Procedure: OPEN REDUCTION INTERNAL FIXATION (ORIF) LEFT ANKLE FRACTURE;  Surgeon: Teryl Lucy, MD;  Location: [facility] OR;  Service: Orthopedics;  Laterality: Left;   RADIOLOGY WITH ANESTHESIA N/A 11/19/2021   Procedure: Left carotid angioplasty with possible stenting;  Surgeon: Julieanne Cotton, MD;  Location: [facility] OR;  Service: Radiology;  Laterality: N/A;   Family History  Problem Relation Age of Onset   Diabetes Mother    Heart disease Mother        Before age 3   Cancer Father        Lung   Hypertension Sister    Diabetes Sister    Diabetes Sister    Diabetes Sister    Social History   Socioeconomic History   Marital status: Legally Separated    Spouse name: Not on file   Number of children: 0   Years of education: College   Highest education level: Not on file  Occupational History   Occupation: [employer]    Employer: [employer]  Tobacco Use   Smoking status: Former    Current packs/day: 0.00    Average packs/day: 1 pack/day for 20.0 years (20.0 ttl pk-yrs)    Types: Cigarettes    Start date: 11/01/1993    Quit date: 11/01/2013    Years since quitting: 9.5    Smokeless tobacco: Never  Vaping Use   Vaping status: Never Used  Substance and Sexual Activity   Alcohol use: Not Currently    Alcohol/week: 1.0 - 2.0 standard drink of alcohol    Types: 1 - 2 Standard drinks or equivalent per week   Drug use: No   Sexual activity: Not Currently  Other Topics Concern   Not on file  Social History Narrative   Patient lives  in 1 story home with sister.   Caffeine Use: 2 cups daily; sodas occasionally   Some college education   Works as Lawyer for [employer]   Social Determinants of Health   Financial Resource Strain: Low Risk  (05/18/2023)   Overall Financial Resource Strain (CARDIA)    Difficulty of Paying Living Expenses: Not hard at all  Food Insecurity: No Food Insecurity (05/18/2023)   Hunger Vital Sign    Worried About Running Out of Food in the Last Year: Never true    Ran Out of Food in the Last Year: Never true  Transportation Needs: No Transportation Needs (05/18/2023)   PRAPARE - Administrator, Civil Service (Medical): No    Lack of Transportation (Non-Medical): No  Physical Activity: Insufficiently Active (05/18/2023)   Exercise Vital Sign    Days of Exercise per Week: 4 days    Minutes of Exercise per Session: 30 min  Stress: No Stress Concern Present (05/18/2023)   [employer] of Occupational Health - Occupational Stress Questionnaire    Feeling of Stress : Only a little  Social Connections: Moderately Integrated (05/18/2023)   Social Connection and Isolation Panel [NHANES]    Frequency of Communication with Friends and Family: More than three times a week    Frequency of Social Gatherings with Friends and Family: More than three times a week    Attends Religious Services: More than 4 times per year    Active Member of [employer] or Organizations: Yes    Attends Engineer, structural: More than 4 times per year    Marital Status: Separated    Tobacco Counseling Counseling given: Not  Answered   Clinical Intake:  Pre-visit preparation completed: Yes  Pain : No/denies pain     BMI - recorded: 23.23 Nutritional Status: BMI of 19-24  Normal Nutritional Risks: None Diabetes: Yes CBG done?: Yes ([place] 221)  How often do you need to have someone help you when you read instructions, pamphlets, or other written materials from your doctor or pharmacy?: 1 - Never  Interpreter Needed?: No  Information entered by :: R. Robertlee Rogacki LPN   Activities of Daily Living    05/18/2023   12:52 PM 01/19/2023   12:37 AM  In your present state of health, do you have any difficulty performing the following activities:  Hearing? 1   Comment aids   Vision? 0   Comment glasses   Difficulty concentrating or making decisions? 0   Walking or climbing stairs? 0   Dressing or bathing? 0   Doing errands, shopping? 0 0  Preparing Food and eating ? N   Using the Toilet? N   In the past six months, have you accidently leaked urine? N   Do you have problems with loss of bowel control? N   Managing your Medications? N   Managing your Finances? N   Housekeeping or managing your Housekeeping? N     Patient Care Team: Etta Grandchild, MD as PCP - General (Internal Medicine) Lolly Mustache Phillips Grout, MD as Consulting Physician (Psychiatry) Genia Del Daisy Blossom, MD as Consulting Physician (Ophthalmology) Meriam Sprague, MD as Consulting Physician (Cardiology)  Indicate any recent Medical Services you may have received from other than [facility] providers in the past year (date may be approximate).     Assessment:   This is a routine wellness examination for [facility].  Hearing/Vision screen Hearing Screening - Comments:: Wears aids Vision Screening - Comments:: glasses   Goals Addressed  This Visit's Progress    Patient Stated       Continue doing as she is now        Depression Screen    05/18/2023   12:55 PM 01/27/2023   10:58 AM 03/29/2022   10:08 AM 02/25/2022    1:05 PM  01/28/2022   11:36 AM 01/19/2022   11:41 AM 12/16/2021    1:15 PM  PHQ 2/9 Scores  PHQ - 2 Score 1 2 2 2  0 4 1  PHQ- 9 Score 3 9 14 9  0 16     Fall Risk    05/18/2023   12:54 PM 03/29/2022   10:08 AM 02/25/2022    1:05 PM 02/18/2022    1:01 PM 01/28/2022   11:39 AM  Fall Risk   Falls in the past year? 0 1 1 1  0  Number falls in past yr: 0 0 0 0 0  Injury with Fall? 0 0 1 1 0  Risk for fall due to : No Fall Risks Other (Comment)   No Fall Risks  Follow up Falls prevention discussed;Falls evaluation completed Falls evaluation completed       MEDICARE RISK AT HOME: Medicare Risk at Home Any stairs in or around the home?: No If so, are there any without handrails?: No Home free of loose throw rugs in walkways, pet beds, electrical cords, etc?: Yes Adequate lighting in your home to reduce risk of falls?: Yes Life alert?: No Use of a cane, walker or w/c?: No Grab bars in the bathroom?: No Shower chair or bench in shower?: No Elevated toilet seat or a handicapped toilet?: No    Cognitive Function:    03/03/2018    1:00 PM  MMSE - Mini Mental State Exam  Orientation to time 5  Orientation to Place 5  Registration 3  Attention/ Calculation 5  Recall 1  Language- name 2 objects 2  Language- repeat 1  Language- follow 3 step command 3  Language- read & follow direction 1  Write a sentence 1  Copy design 1  Total score 28        05/18/2023   12:59 PM 01/28/2022   11:40 AM  6CIT Screen  What Year? 0 points 0 points  What month? 0 points 0 points  What time? 0 points 0 points  Count back from 20 0 points 0 points  Months in reverse 0 points 0 points  Repeat phrase 2 points 0 points  Total Score 2 points 0 points    Immunizations Immunization History  Administered Date(s) Administered   Fluad Quad(high Dose 65+) 04/06/2019   Influenza Split 05/17/2013   Influenza, High Dose Seasonal PF 05/25/2018, 05/25/2022   Influenza,inj,Quad PF,6+ Mos 05/08/2014, 05/13/2015,  06/29/2017, 04/24/2020   Influenza-Unspecified 07/27/2021   PFIZER(Purple Top)SARS-COV-2 Vaccination 09/22/2019, 10/13/2019, 05/18/2020, 02/05/2021   Pneumococcal Conjugate-13 12/20/2016   Pneumococcal Polysaccharide-23 11/02/2013, 04/06/2019   Tdap 11/24/2016    TDAP status: Up to date  Flu Vaccine status: Due, Education has been provided regarding the importance of this vaccine. Advised may receive this vaccine at local pharmacy or Health Dept. Aware to provide a copy of the vaccination record if obtained from local pharmacy or Health Dept. Verbalized acceptance and understanding.  Pneumococcal vaccine status: Up to date  Covid-19 vaccine status: Information provided on how to obtain vaccines.   Qualifies for Shingles Vaccine? Yes   Zostavax completed No   Shingrix Completed?: No.    Education has been provided regarding the  importance of this vaccine. Patient has been advised to call insurance company to determine out of pocket expense if they have not yet received this vaccine. Advised may also receive vaccine at local pharmacy or Health Dept. Verbalized acceptance and understanding.  Screening Tests Health Maintenance  Topic Date Due   Lung Cancer Screening  Never done   COVID-19 Vaccine (5 - 2023-24 season) 04/17/2023   Zoster Vaccines- Shingrix (1 of 2) 08/02/2023 (Originally 07/24/1999)   INFLUENZA VACCINE  11/14/2023 (Originally 03/17/2023)   MAMMOGRAM  06/19/2023   HEMOGLOBIN A1C  10/31/2023   Diabetic kidney evaluation - eGFR measurement  04/19/2024   Diabetic kidney evaluation - Urine ACR  04/19/2024   OPHTHALMOLOGY EXAM  04/19/2024   FOOT EXAM  05/02/2024   Colonoscopy  11/24/2025   DTaP/Tdap/Td (2 - Td or Tdap) 11/25/2026   Pneumonia Vaccine 79+ Years old  Completed   DEXA SCAN  Completed   Hepatitis C Screening  Completed   HPV VACCINES  Aged Out    Health Maintenance  Health Maintenance Due  Topic Date Due   Lung Cancer Screening  Never done   COVID-19  Vaccine (5 - 2023-24 season) 04/17/2023    Colorectal cancer screening: Type of screening: Colonoscopy. Completed 11/2015. Repeat every 10 years  Mammogram status: Completed 06/2021. Repeat every year order placed  Bone Density status: Completed 04/2009. Results reflect: Bone density results: OSTEOPOROSIS. Repeat every 2 years. Order placed  Lung Cancer Screening: (Low Dose CT Chest recommended if Age 65-80 years, 20 pack-year currently smoking OR have quit w/in 15years.) does qualify.  Order placed 05/03/2023    Additional Screening:  Hepatitis C Screening: does qualify; Completed 12/2016  Vision Screening: Recommended annual ophthalmology exams for early detection of glaucoma and other disorders of the eye. Is the patient up to date with their annual eye exam?  Yes  Who is the provider or what is the name of the office in which the patient attends annual eye exams? [facility] If pt is not established with a provider, would they like to be referred to a provider to establish care? No .   Dental Screening: Recommended annual dental exams for proper oral hygiene  Diabetic Foot Exam: Diabetic Foot Exam: Completed 04/2023  Community Resource Referral / Chronic Care Management: CRR required this visit?  No   CCM required this visit?  No     Plan:     I have personally reviewed and noted the following in the patient's chart:   Medical and social history Use of alcohol, tobacco or illicit drugs  Current medications and supplements including opioid prescriptions. Patient is currently taking opioid prescriptions. Information provided to patient regarding non-opioid alternatives. Patient advised to discuss non-opioid treatment plan with their provider. Functional ability and status Nutritional status Physical activity Advanced directives List of other physicians Hospitalizations, surgeries, and ER visits in previous 12 months Vitals Screenings to include cognitive,  depression, and falls Referrals and appointments  In addition, I have reviewed and discussed with patient certain preventive protocols, quality metrics, and best practice recommendations. A written personalized care plan for preventive services as well as general preventive health recommendations were provided to patient.     Sydell Axon, LPN   16/08/958   After Visit Summary: (MyChart) Due to this being a telephonic visit, the after visit summary with patients personalized plan was offered to patient via MyChart   Nurse Notes: None

## 2023-05-18 NOTE — Patient Instructions (Signed)
[name] , Thank you for taking time to come for your Medicare Wellness Visit. I appreciate your ongoing commitment to your health goals. Please review the following plan we discussed and let me know if I can assist you in the future.   Referrals/Orders/Follow-Ups/Clinician Recommendations: Mammogram and Dexa/Bone Density, update vaccines You have an order for:  []   2D Mammogram  [x]   3D Mammogram  [x]   Bone Density     Please call for appointment:  [facility] [address] [place], [place] [address] [phone]  Managing Pain Without Opioids  Opioids are strong medicines used to treat moderate to severe pain. For some people, especially those who have long-term (chronic) pain, opioids may not be the best choice for pain management due to: Side effects like nausea, constipation, and sleepiness. The risk of addiction (opioid use disorder). The longer you take opioids, the greater your risk of addiction. Pain that lasts for more than 3 months is called chronic pain. Managing chronic pain usually requires more than one approach and is often provided by a team of health care providers working together (multidisciplinary approach). Pain management may be done at a pain management center or pain clinic. How to manage pain without the use of opioids Use non-opioid medicines Non-opioid medicines for pain may include: Over-the-counter or prescription non-steroidal anti-inflammatory drugs (NSAIDs). These may be the first medicines used for pain. They work well for muscle and bone pain, and they reduce swelling. Acetaminophen. This over-the-counter medicine may work well for milder pain but not swelling. Antidepressants. These may be used to treat chronic pain. A certain type of antidepressant (tricyclics) is often used. These medicines are given in lower doses for pain than when used for depression. Anticonvulsants. These are usually used to treat seizures but may also  reduce nerve (neuropathic) pain. Muscle relaxants. These relieve pain caused by sudden muscle tightening (spasms). You may also use a pain medicine that is applied to the skin as a patch, cream, or gel (topical analgesic), such as a numbing medicine. These may cause fewer side effects than medicines taken by mouth. Do certain therapies as directed Some therapies can help with pain management. They include: Physical therapy. You will do exercises to gain strength and flexibility. A physical therapist may teach you exercises to move and stretch parts of your body that are weak, stiff, or painful. You can learn these exercises at physical therapy visits and practice them at home. Physical therapy may also involve: Massage. Heat wraps or applying heat or cold to affected areas. Electrical signals that interrupt pain signals (transcutaneous electrical nerve stimulation, TENS). Weak lasers that reduce pain and swelling (low-level laser therapy). Signals from your body that help you learn to regulate pain (biofeedback). Occupational therapy. This helps you to learn ways to function at home and work with less pain. Recreational therapy. This involves trying new activities or hobbies, such as a physical activity or drawing. Mental health therapy, including: Cognitive behavioral therapy (CBT). This helps you learn coping skills for dealing with pain. Acceptance and commitment therapy (ACT) to change the way you think and react to pain. Relaxation therapies, including muscle relaxation exercises and mindfulness-based stress reduction. Pain management counseling. This may be individual, family, or group counseling.  Receive medical treatments Medical treatments for pain management include: Nerve block injections. These may include a pain blocker and anti-inflammatory medicines. You may have injections: Near the spine to relieve chronic back or neck pain. Into joints to relieve  back or joint pain. Into  nerve areas that supply a painful area to relieve body pain. Into muscles (trigger point injections) to relieve some painful muscle conditions. A medical device placed near your spine to help block pain signals and relieve nerve pain or chronic back pain (spinal cord stimulation device). Acupuncture. Follow these instructions at home Medicines Take over-the-counter and prescription medicines only as told by your health care provider. If you are taking pain medicine, ask your health care providers about possible side effects to watch out for. Do not drive or use heavy machinery while taking prescription opioid pain medicine. Lifestyle  Do not use drugs or alcohol to reduce pain. If you drink alcohol, limit how much you have to: 0-1 drink a day for women who are not pregnant. 0-2 drinks a day for men. Know how much alcohol is in a drink. In the [place] one drink equals one 12 oz bottle of beer (355 mL), one 5 oz glass of wine (148 mL), or one 1 oz glass of hard liquor (44 mL). Do not use any products that contain nicotine or tobacco. These products include cigarettes, chewing tobacco, and vaping devices, such as e-cigarettes. If you need help quitting, ask your health care provider. Eat a healthy diet and maintain a healthy weight. Poor diet and excess weight may make pain worse. Eat foods that are high in fiber. These include fresh fruits and vegetables, whole grains, and beans. Limit foods that are high in fat and processed sugars, such as fried and sweet foods. Exercise regularly. Exercise lowers stress and may help relieve pain. Ask your health care provider what activities and exercises are safe for you. If your health care provider approves, join an exercise class that combines movement and stress reduction. Examples include yoga and tai chi. Get enough sleep. Lack of sleep may make pain worse. Lower stress as much as possible. Practice stress reduction techniques as told by your  therapist. General instructions Work with all your pain management providers to find the treatments that work best for you. You are an important member of your pain management team. There are many things you can do to reduce pain on your own. Consider joining an online or in-person support group for people who have chronic pain. Keep all follow-up visits. This is important. Where to find more information You can find more information about managing pain without opioids from: American Academy of Pain Medicine: painmed.org Institute for Chronic Pain: instituteforchronicpain.org American Chronic Pain Association: theacpa.org Contact a health care provider if: You have side effects from pain medicine. Your pain gets worse or does not get better with treatments or home therapy. You are struggling with anxiety or depression. Summary Many types of pain can be managed without opioids. Chronic pain may respond better to pain management without opioids. Pain is best managed when you and a team of health care providers work together. Pain management without opioids may include non-opioid medicines, medical treatments, physical therapy, mental health therapy, and lifestyle changes. Tell your health care providers if your pain gets worse or is not being managed well enough. This information is not intended to replace advice given to you by your health care provider. Make sure you discuss any questions you have with your health care provider. Document Revised: 11/12/2020 Document Reviewed: 11/12/2020 Elsevier Patient Education  2024 Elsevier Inc.  Make sure to wear two-piece clothing.  No lotions, powders, or deodorants the day of the appointment. Make sure to bring picture ID and  insurance card.  Bring list of medications you are currently taking including any supplements.   Schedule your [facility] screening mammogram through MyChart!   Log into your MyChart account.  Go to 'Visit' (or  'Appointments' if on mobile App) --> Schedule an Appointment  Under 'Select a Reason for Visit' choose the Mammogram Screening option.  Complete the pre-visit questions and select the time and place that best fits your schedule.    This is a list of the screening recommended for you and due dates:  Health Maintenance  Topic Date Due   Screening for Lung Cancer  Never done   COVID-19 Vaccine (5 - 2023-24 season) 04/17/2023   Zoster (Shingles) Vaccine (1 of 2) 08/02/2023*   Flu Shot  11/14/2023*   Mammogram  06/19/2023   Hemoglobin A1C  10/31/2023   Yearly kidney function blood test for diabetes  04/19/2024   Yearly kidney health urinalysis for diabetes  04/19/2024   Eye exam for diabetics  04/19/2024   Complete foot exam   05/02/2024   Colon Cancer Screening  11/24/2025   DTaP/Tdap/Td vaccine (2 - Td or Tdap) 11/25/2026   Pneumonia Vaccine  Completed   DEXA scan (bone density measurement)  Completed   Hepatitis C Screening  Completed   HPV Vaccine  Aged Out  *Topic was postponed. The date shown is not the original due date.    Advanced directives: (Declined) Advance directive discussed with you today. Even though you declined this today, please call our office should you change your mind, and we can give you the proper paperwork for you to fill out.  Next Medicare Annual Wellness Visit scheduled for next year: Yes 05/21/24 @ 1:00

## 2023-05-19 ENCOUNTER — Encounter (HOSPITAL_COMMUNITY): Payer: Self-pay

## 2023-05-23 ENCOUNTER — Ambulatory Visit: Payer: 59 | Admitting: Internal Medicine

## 2023-05-23 ENCOUNTER — Other Ambulatory Visit: Payer: 59

## 2023-06-07 ENCOUNTER — Other Ambulatory Visit (HOSPITAL_COMMUNITY): Payer: Self-pay

## 2023-06-10 ENCOUNTER — Other Ambulatory Visit: Payer: Self-pay | Admitting: Internal Medicine

## 2023-06-10 DIAGNOSIS — E785 Hyperlipidemia, unspecified: Secondary | ICD-10-CM

## 2023-06-16 ENCOUNTER — Telehealth (HOSPITAL_BASED_OUTPATIENT_CLINIC_OR_DEPARTMENT_OTHER): Payer: Self-pay | Admitting: Psychiatry

## 2023-06-16 ENCOUNTER — Encounter (HOSPITAL_COMMUNITY): Payer: Self-pay

## 2023-06-16 DIAGNOSIS — Z91199 Patient's noncompliance with other medical treatment and regimen due to unspecified reason: Secondary | ICD-10-CM

## 2023-06-16 NOTE — Progress Notes (Signed)
Patient no showed today.  I called and left a voicemail on her phone.

## 2023-06-21 ENCOUNTER — Ambulatory Visit: Payer: 59

## 2023-07-01 ENCOUNTER — Telehealth (HOSPITAL_BASED_OUTPATIENT_CLINIC_OR_DEPARTMENT_OTHER): Payer: 59 | Admitting: Psychiatry

## 2023-07-01 ENCOUNTER — Encounter (HOSPITAL_COMMUNITY): Payer: Self-pay | Admitting: Psychiatry

## 2023-07-01 VITALS — Wt 127.0 lb

## 2023-07-01 DIAGNOSIS — R251 Tremor, unspecified: Secondary | ICD-10-CM | POA: Diagnosis not present

## 2023-07-01 DIAGNOSIS — F411 Generalized anxiety disorder: Secondary | ICD-10-CM | POA: Diagnosis not present

## 2023-07-01 DIAGNOSIS — F331 Major depressive disorder, recurrent, moderate: Secondary | ICD-10-CM | POA: Diagnosis not present

## 2023-07-01 MED ORDER — BUPROPION HCL ER (XL) 300 MG PO TB24: 300.0000 mg | ORAL_TABLET | Freq: Every day | ORAL | 0 refills | Status: DC

## 2023-07-01 MED ORDER — TRAZODONE HCL 50 MG PO TABS: 50.0000 mg | ORAL_TABLET | Freq: Every evening | ORAL | 0 refills | Status: DC | PRN

## 2023-07-01 MED ORDER — BREXPIPRAZOLE 1 MG PO TABS
1.0000 mg | ORAL_TABLET | Freq: Every day | ORAL | 0 refills | Status: DC
Start: 2023-07-01 — End: 2023-09-21

## 2023-07-01 MED ORDER — BENZTROPINE MESYLATE 0.5 MG PO TABS
0.5000 mg | ORAL_TABLET | Freq: Every day | ORAL | 0 refills | Status: DC
Start: 2023-07-01 — End: 2023-09-21

## 2023-07-01 NOTE — Progress Notes (Signed)
[facility] MD Virtual Progress Note   Patient Location: Work Provider Location: Home Office  I connect with patient by telephone and verified that I am speaking with correct person by using two identifiers. I discussed the limitations of evaluation and management by telemedicine and the availability of in person appointments. I also discussed with the patient that there may be a patient responsible charge related to this service. The patient expressed understanding and agreed to proceed.  Sonia Skinner [id number] 72 y.o.  07/01/2023 10:30 AM  History of Present Illness:  Patient is evaluated by phone session.  She told that she cannot do video because she is working and about 2 take a class.  She is a Runner, broadcasting/film/video.  She reported things are okay and she does need of refills of the medication.  She reported recently seeing the endocrinologist and worried about weight loss.  Her nausea and vomiting and bowel pain has somewhat better.  Patient has chronic health issues.  She denies any crying spells or any feeling of hopelessness or worthlessness.  She denies any suicidal thoughts.  Patient has a lot of psychosocial stressors.  Husband has chronic symptoms.  She has limited support from her husband who had a stroke and she take care of him.  Patient not interested in therapy.  She also does not want to change or reduce the dose.  Her last creatinine 2.2.  She is seeing a nephrologist.  Patient like to get her refills.    Past Psychiatric History: H/O taking antidepressant on and off most of life. H/O overdose and inpatient at [facility]. Saw provider at [facility] and given the Strattera, Adderall, Zoloft, Celexa, Lamictal and Lexapro.  Never tested for ADHD. No h/o psychosis, mania or hallucination.  In 2001 moved to [place] [facility] and given amitriptyline and Wellbutrin to stop smoking. Prozac and Paxil made tired and nausea.     Outpatient Encounter Medications as of 07/01/2023   Medication Sig   Accu-Chek FastClix Lancets MISC Use to check blood sugar five times daily.   ACCU-CHEK GUIDE test strip Use to check blood sugar twice a day. E11.9   amLODipine (NORVASC) 10 MG tablet Take 1 tablet (10 mg total) by mouth daily. (Patient taking differently: Take 5 mg by mouth daily.)   aspirin 81 MG chewable tablet Chew 1 tablet (81 mg total) by mouth daily.   atorvastatin (LIPITOR) 40 MG tablet Take 1 tablet (40 mg total) by mouth daily.   benztropine (COGENTIN) 0.5 MG tablet Take 1 tablet (0.5 mg total) by mouth at bedtime.   brexpiprazole (REXULTI) 1 MG TABS tablet Take 1 tablet (1 mg total) by mouth daily.   buPROPion (WELLBUTRIN XL) 300 MG 24 hr tablet Take 1 tablet (300 mg total) by mouth daily.   Continuous Blood Gluc Receiver (DEXCOM G7 RECEIVER) DEVI 1 Act by Does not apply route daily.   Continuous Glucose Sensor (DEXCOM G7 SENSOR) MISC 1 Act by Does not apply route daily.   Dexlansoprazole 30 MG capsule DR Take 1 capsule (30 mg total) by mouth daily.   ezetimibe (ZETIA) 10 MG tablet Take 1 tablet by mouth once daily.   gabapentin (NEURONTIN) 100 MG capsule Take 1 capsule (100 mg total) by mouth 3 (three) times daily.   HYDROcodone-acetaminophen (NORCO) 5-325 MG tablet Take 1 tablet by mouth every 6 (six) hours as needed for moderate pain.   insulin glargine, 1 Unit Dial, (TOUJEO SOLOSTAR) 300 UNIT/ML Solostar Pen Inject 26 Units into  the skin daily.   insulin lispro (HUMALOG KWIKPEN) 200 UNIT/ML KwikPen Inject 5-8 Units into the skin 2 (two) times daily before a meal.   Insulin Pen Needle 32G X 4 MM MISC Use 3x a day   levothyroxine (SYNTHROID) 88 MCG tablet Take 1 tablet by mouth daily.   lubiprostone (AMITIZA) 24 MCG capsule Take 1 capsule (24 mcg total) by mouth 2 (two) times daily with a meal.   Semaglutide,0.25 or 0.5MG /DOS, 2 MG/3ML SOPN Inject 0.25 Units into the skin once a week.   traZODone (DESYREL) 50 MG tablet Take 1 tablet (50 mg total) by mouth at  bedtime as needed for sleep.   No facility-administered encounter medications on file as of 07/01/2023.    Recent Results (from the past 2160 hour(s))  CBC and differential     Status: Abnormal   Collection Time: 04/19/23 12:00 AM  Result Value Ref Range   Hemoglobin 11.5 (A) 12.0 - 16.0   HCT 35 (A) 36 - 46   Platelets 248 150 - 400 K/uL   WBC 8.4   Protein / creatinine ratio, urine     Status: None   Collection Time: 04/19/23 12:00 AM  Result Value Ref Range   Creatinine, Urine 188.3    Albumin, U 3.9   Basic metabolic panel     Status: Abnormal   Collection Time: 04/19/23 12:00 AM  Result Value Ref Range   Glucose 175    BUN 25 (A) 4 - 21   CO2 24 (A) 13 - 22   Creatinine 2.2 (A) 0.5 - 1.1   Potassium 4.7 3.5 - 5.1 mEq/L   Sodium 139 137 - 147   Chloride 103 99 - 108  Comprehensive metabolic panel     Status: None   Collection Time: 04/19/23 12:00 AM  Result Value Ref Range   eGFR 24    Calcium 10.1 8.7 - 10.7   Albumin 4.5 3.5 - 5.0  HM DIABETES EYE EXAM     Status: None   Collection Time: 04/20/23 12:00 AM  Result Value Ref Range   HM Diabetic Eye Exam No Retinopathy No Retinopathy  Lab report - scanned     Status: None   Collection Time: 04/20/23 11:56 AM  Result Value Ref Range   Creatinine, POC 188.3 mg/dL    Comment: Abstracted by HIM   Albumin, Urine POC 3.9    Albumin/Creatinine Ratio, Urine, POC 2    EGFR 24.0   Folate     Status: None   Collection Time: 05/03/23  4:10 PM  Result Value Ref Range   Folate 13.3 >5.9 ng/mL  Lipid panel     Status: Abnormal   Collection Time: 05/03/23  4:10 PM  Result Value Ref Range   Cholesterol 115 0 - 200 mg/dL    Comment: ATP III Classification       Desirable:  < 200 mg/dL               Borderline High:  200 - 239 mg/dL          High:  > = 601 mg/dL   Triglycerides 093.2 0.0 - 149.0 mg/dL    Comment: Normal:  <355 mg/dLBorderline High:  150 - 199 mg/dL   HDL 73.22 (L) >02.54 mg/dL   VLDL 27.0 0.0 - 62.3 mg/dL    LDL Cholesterol 54 0 - 99 mg/dL   Total CHOL/HDL Ratio 3     Comment:  Men          Women1/2 Average Risk     3.4          3.3Average Risk          5.0          4.42X Average Risk          9.6          7.13X Average Risk          15.0          11.0                       NonHDL 76.53     Comment: NOTE:  Non-HDL goal should be 30 mg/dL higher than patient's LDL goal (i.e. LDL goal of < 70 mg/dL, would have non-HDL goal of < 100 mg/dL)  Hemoglobin N8G     Status: Abnormal   Collection Time: 05/03/23  4:10 PM  Result Value Ref Range   Hgb A1c MFr Bld 9.2 (H) 4.6 - 6.5 %    Comment: Glycemic Control Guidelines for People with Diabetes:Non Diabetic:  <6%Goal of Therapy: <7%Additional Action Suggested:  >8%   TSH     Status: None   Collection Time: 05/03/23  4:10 PM  Result Value Ref Range   TSH 1.09 0.35 - 5.50 uIU/mL     Psychiatric Specialty Exam: Physical Exam  Review of Systems  Weight 127 lb (57.6 kg).There is no height or weight on file to calculate BMI.  General Appearance: NA  Eye Contact:  NA  Speech:  Slow  Volume:  Decreased  Mood:  Euthymic and "I am fine"  Affect:  NA  Thought Process:  Descriptions of Associations: Intact  Orientation:  Full (Time, Place, and Person)  Thought Content:  WDL  Suicidal Thoughts:  No  Homicidal Thoughts:  No  Memory:  Immediate;   Good Recent;   Fair Remote;   Fair  Judgement:  Fair  Insight:  Shallow  Psychomotor Activity:  NA  Concentration:  Concentration: Fair and Attention Span: Fair  Recall:  [employer] of Knowledge:  Fair  Language:  Good  Akathisia:  No  Handed:  Right  AIMS (if indicated):     Assets:  Communication Skills Desire for Improvement Housing Talents/Skills Transportation  ADL's:  Intact  Cognition:  WNL  Sleep:  ok     Assessment/Plan: Moderate episode of recurrent major depressive disorder (HCC) - Plan: traZODone (DESYREL) 50 MG tablet, benztropine (COGENTIN) 0.5 MG tablet, buPROPion  (WELLBUTRIN XL) 300 MG 24 hr tablet, brexpiprazole (REXULTI) 1 MG TABS tablet  Tremor - Plan: benztropine (COGENTIN) 0.5 MG tablet  GAD (generalized anxiety disorder) - Plan: buPROPion (WELLBUTRIN XL) 300 MG 24 hr tablet, brexpiprazole (REXULTI) 1 MG TABS tablet  Review blood work results.  Creatinine 2.2.  Somewhat stable from the past.  She is no longer having nausea or abdominal pain since Ozempic discontinued.  She is not interested in therapy.  She like to get her refills.  Continue REXULTI 1 mg at bedtime, trazodone 50 mg at bedtime, Cogentin 0.5 mg daily and Wellbutrin XL 300 mg daily.  I emphasized should consider in person visit in the future.  Patient does not like to be on video.  Follow-up in 3 months.   Follow Up Instructions:     I discussed the assessment and treatment plan with the patient. The patient was provided an opportunity to ask questions and  all were answered. The patient agreed with the plan and demonstrated an understanding of the instructions.   The patient was advised to call back or seek an in-person evaluation if the symptoms worsen or if the condition fails to improve as anticipated.    Collaboration of Care: Other provider involved in patient's care AEB notes are available in epic to review  Patient/Guardian was advised Release of Information must be obtained prior to any record release in order to collaborate their care with an outside provider. Patient/Guardian was advised if they have not already done so to contact the registration department to sign all necessary forms in order for [place] to release information regarding their care.   Consent: Patient/Guardian gives verbal consent for treatment and assignment of benefits for services provided during this visit. Patient/Guardian expressed understanding and agreed to proceed.     I provided 17 minutes of non face to face time during this encounter.  Note: This document was prepared by [employer] voice dictation  technology and any errors that results from this process are unintentional.    Cleotis Nipper, MD 07/01/2023

## 2023-07-04 ENCOUNTER — Ambulatory Visit (HOSPITAL_BASED_OUTPATIENT_CLINIC_OR_DEPARTMENT_OTHER): Payer: 59 | Admitting: Cardiology

## 2023-07-06 ENCOUNTER — Ambulatory Visit: Payer: 59 | Admitting: Cardiology

## 2023-08-05 ENCOUNTER — Other Ambulatory Visit: Payer: Self-pay | Admitting: Internal Medicine

## 2023-08-05 DIAGNOSIS — Z794 Long term (current) use of insulin: Secondary | ICD-10-CM

## 2023-08-15 ENCOUNTER — Ambulatory Visit: Payer: Self-pay | Admitting: Internal Medicine

## 2023-08-15 NOTE — Progress Notes (Signed)
 Acute Office Visit  Subjective:     Patient ID: Sonia Skinner, female    DOB: 23-Feb-1951, 72 y.o.   MRN: [id number]  Chief Complaint  Patient presents with   Facial Swelling    Left upper eyelid swelling for the past week. Notes of itching and pain (throbbing). Patient notes iris is slightly bigger on that left side    HPI Patient is in today for evaluation of left eyelid swelling for the last few days.  Patient has been using cold compresses to the area with no relief.  Reports that her eye has been a little bit itchy and irritated.  Denies discharge from the area, denies vision change, denies pain to the area.  Reports that she has not been taking any amlodipine  and her pill packs.  States that one of her doctors stopped the amlodipine , she thinks it is the kidney specialist.  Blood pressure is elevated today, has not been checking blood pressures at home.  Requesting refills of gabapentin  and hydrocodone  as well today.  Denies other concerns.  ROS Per HPI      Objective:    BP (!) 162/68   Pulse 80   Temp 98.2 F (36.8 C)   Ht 5' 2 (1.575 m)   Wt 146 lb 3.2 oz (66.3 kg)   SpO2 96%   BMI 26.74 kg/m    Physical Exam Vitals and nursing note reviewed.  Constitutional:      General: She is not in acute distress.    Appearance: Normal appearance. She is normal weight.  HENT:     Head: Normocephalic and atraumatic.  Eyes:     General:        Left eye: Hordeolum present.    Extraocular Movements: Extraocular movements intact.     Pupils: Pupils are equal, round, and reactive to light.      Comments: Area of swelling, no erythema, no discharge, no scleral injection, non tender  Pulmonary:     Effort: Pulmonary effort is normal. No respiratory distress.     Breath sounds: Normal breath sounds.  Musculoskeletal:        General: Normal range of motion.     Cervical back: Normal range of motion.  Skin:    General: Skin is warm and dry.  Neurological:     General:  No focal deficit present.     Mental Status: She is alert and oriented to person, place, and time.  Psychiatric:        Mood and Affect: Mood normal.        Thought Content: Thought content normal.    No results found for any visits on 08/16/23.      Assessment & Plan:  1. Hordeolum internum of left upper eyelid (Primary)  -Education handout provided -May use warm compresses -May use natural tears -Follow-up if this has not resolved in about 2 weeks  2. Type 2 diabetes mellitus with diabetic polyneuropathy, with long-term current use of insulin  (HCC)  - AMB Referral [facility] Care Management  3. Essential hypertension, benign  - amLODipine  (NORVASC ) 5 MG tablet; Take 1 tablet (5 mg total) by mouth daily.  Dispense: 90 tablet; Refill: 0 - AMB Referral [facility] Care Management  4. Acquired hypothyroidism  - AMB Referral [facility] Care Management  5. Cerebrovascular accident (CVA) due to stenosis of left middle cerebral artery (HCC)  - AMB Referral [facility] Care Management  6. Severe episode of recurrent major depressive disorder, without psychotic features (HCC)  -  AMB Referral [facility] Care Management  7. Chronic renal disease, stage 4, severely decreased glomerular filtration rate (GFR) between 15-29 mL/min/1.73 square meter (HCC)  - AMB Referral [facility] Care Management  8. Chronic diastolic CHF (congestive heart failure) (HCC)  - AMB Referral [facility] Care Management  9. Chronic midline low back pain with bilateral sciatica  - HYDROcodone -acetaminophen  (NORCO) 5-325 MG tablet; Take 1 tablet by mouth every 6 (six) hours as needed for moderate pain (pain score 4-6).  Dispense: 100 tablet; Refill: 0 - AMB Referral [facility] Care Management  10. Intractable migraine without aura and with status migrainosus  - gabapentin  (NEURONTIN ) 100 MG capsule; Take 1 capsule (100 mg total) by mouth 3 (three) times daily.  Dispense: 270 capsule; Refill: 1 - AMB Referral [facility] Care Management  11. Encounter for  palliative care involving management of pain  - HYDROcodone -acetaminophen  (NORCO) 5-325 MG tablet; Take 1 tablet by mouth every 6 (six) hours as needed for moderate pain (pain score 4-6).  Dispense: 100 tablet; Refill: 0 - AMB Referral [facility] Care Management  States she has not met with the pharmacist, would like to try to do this with   Meds ordered this encounter  Medications   amLODipine  (NORVASC ) 5 MG tablet    Sig: Take 1 tablet (5 mg total) by mouth daily.    Dispense:  90 tablet    Refill:  0   gabapentin  (NEURONTIN ) 100 MG capsule    Sig: Take 1 capsule (100 mg total) by mouth 3 (three) times daily.    Dispense:  270 capsule    Refill:  1   HYDROcodone -acetaminophen  (NORCO) 5-325 MG tablet    Sig: Take 1 tablet by mouth every 6 (six) hours as needed for moderate pain (pain score 4-6).    Dispense:  100 tablet    Refill:  0    Return in about 2 weeks (around 08/30/2023) for pharmacist BP .  Corean Ku, FNP

## 2023-08-15 NOTE — Telephone Encounter (Signed)
Patient has appointment tomorrow

## 2023-08-15 NOTE — Telephone Encounter (Signed)
  Chief Complaint: L eyelid swelling Symptoms: swelling, itching Frequency: 4 days Pertinent Negatives: Patient denies injury, redness, drainage Disposition: [] ED /[] Urgent Care (no appt availability in office) / [x] Appointment(In office/virtual)/ []  [facility]/ [] Home Care/ [] Refused Recommended Disposition /[] [facility]/ []  Follow-up with PCP Additional Notes: Pt called stating that she has L eyelid swelling x 4 days. Denies pain without pressure to the area, with pressure 5/10. States it is itchy, no redness or drainage noted. States her eye is not swollen shut but she does have a little blurry vision. States her eyelid appears darker than normal, but the skin on the eyelid feels normal. Denies any injury or wound. Per protocol, pt to be evaluated within 24 hours. Pt scheduled with a provider at clinic for 08/16/23 at 1140. Care advice reviewed, pt verbalized understanding. Denies any further questions at this time. Alerting PCP for review.    Copied from CRM [id number]. Topic: Clinical - Red Word Triage >> Aug 15, 2023 12:35 PM Adele Barthel wrote: Red Word that prompted transfer to Nurse Triage: Pt has been experiencing pain, swelling, and darkening of left eyelid. Was advised by eye physician to see provider. Could not be see until 09/22/2023 Reason for Disposition  MODERATE-SEVERE eyelid swelling on one side  (Exception: Due to a mosquito bite.)  Answer Assessment - Initial Assessment Questions 1. ONSET: "When did the swelling start?" (e.g., minutes, hours, days)     2 days ago 2. LOCATION: "What part of the eyelids is swollen?"     L eyelid 3. SEVERITY: "How swollen is it?"     "Pretty good", about double the normal size 4. ITCHING: "Is there any itching?" If Yes, ask: "How much?"   (Scale 1-10; mild, moderate or severe)     moderate 5. PAIN: "Is the swelling painful to touch?" If Yes, ask: "How painful is it?"   (Scale 1-10; mild, moderate or severe)     States no  pain unless pressure applied, 5/10 6. FEVER: "Do you have a fever?" If Yes, ask: "What is it, how was it measured, and when did it start?"      Denies 7. CAUSE: "What do you think is causing the swelling?"     Unknown, woke up like this. 8. RECURRENT SYMPTOM: "Have you had eyelid swelling before?" If Yes, ask: "When was the last time?" "What happened that time?"     Denies 9. OTHER SYMPTOMS: "Do you have any other symptoms?" (e.g., blurred vision, eye discharge, rash, runny nose)     A little blurrier  Protocols used: Eye - Swelling-A-AH

## 2023-08-16 ENCOUNTER — Encounter: Payer: Self-pay | Admitting: Family Medicine

## 2023-08-16 ENCOUNTER — Ambulatory Visit (INDEPENDENT_AMBULATORY_CARE_PROVIDER_SITE_OTHER): Payer: 59 | Admitting: Family Medicine

## 2023-08-16 VITALS — BP 162/68 | HR 80 | Temp 98.2°F | Ht 62.0 in | Wt 146.2 lb

## 2023-08-16 DIAGNOSIS — I5032 Chronic diastolic (congestive) heart failure: Secondary | ICD-10-CM | POA: Diagnosis not present

## 2023-08-16 DIAGNOSIS — G43011 Migraine without aura, intractable, with status migrainosus: Secondary | ICD-10-CM

## 2023-08-16 DIAGNOSIS — E1142 Type 2 diabetes mellitus with diabetic polyneuropathy: Secondary | ICD-10-CM

## 2023-08-16 DIAGNOSIS — I10 Essential (primary) hypertension: Secondary | ICD-10-CM | POA: Diagnosis not present

## 2023-08-16 DIAGNOSIS — H00024 Hordeolum internum left upper eyelid: Secondary | ICD-10-CM

## 2023-08-16 DIAGNOSIS — Z794 Long term (current) use of insulin: Secondary | ICD-10-CM | POA: Diagnosis not present

## 2023-08-16 DIAGNOSIS — M5442 Lumbago with sciatica, left side: Secondary | ICD-10-CM

## 2023-08-16 DIAGNOSIS — N184 Chronic kidney disease, stage 4 (severe): Secondary | ICD-10-CM | POA: Diagnosis not present

## 2023-08-16 DIAGNOSIS — M5441 Lumbago with sciatica, right side: Secondary | ICD-10-CM

## 2023-08-16 DIAGNOSIS — Z515 Encounter for palliative care: Secondary | ICD-10-CM

## 2023-08-16 DIAGNOSIS — E039 Hypothyroidism, unspecified: Secondary | ICD-10-CM | POA: Diagnosis not present

## 2023-08-16 DIAGNOSIS — I63512 Cerebral infarction due to unspecified occlusion or stenosis of left middle cerebral artery: Secondary | ICD-10-CM

## 2023-08-16 DIAGNOSIS — G8929 Other chronic pain: Secondary | ICD-10-CM

## 2023-08-16 DIAGNOSIS — F332 Major depressive disorder, recurrent severe without psychotic features: Secondary | ICD-10-CM

## 2023-08-16 MED ORDER — AMLODIPINE BESYLATE 5 MG PO TABS
5.0000 mg | ORAL_TABLET | Freq: Every day | ORAL | 0 refills | Status: AC
Start: 2023-08-16 — End: ?

## 2023-08-16 MED ORDER — GABAPENTIN 100 MG PO CAPS: 100.0000 mg | ORAL_CAPSULE | Freq: Three times a day (TID) | ORAL | 1 refills | Status: AC

## 2023-08-16 MED ORDER — HYDROCODONE-ACETAMINOPHEN 5-325 MG PO TABS: 1.0000 | ORAL_TABLET | Freq: Four times a day (QID) | ORAL | 0 refills | Status: DC | PRN

## 2023-08-16 NOTE — Patient Instructions (Addendum)
 May use warm compresses to the left eye  May use natural tears to help lubricate the eye  Follow up in 2 weeks if the area has not resolved.  I have sent in a prescription for amlodipine  5 mg once daily to help control blood pressure.  If you have a way to check your blood pressures at home, please do this once daily for the next month and then follow-up with us  in the office to recheck and make sure this is working for you.  I have sent in a refill of gabapentin  as well as hydrocodone  to your pharmacy at [facility].  I have also placed a referral for you to visit our pharmacist, they will be reaching out to get you scheduled with them.

## 2023-08-23 ENCOUNTER — Telehealth: Payer: Self-pay

## 2023-08-23 NOTE — Progress Notes (Signed)
 Care Guide Pharmacy Note  08/23/2023 Name: Sonia Skinner MRN: [id number] DOB: 12-21-1950  Referred By: Joshua Debby CROME, MD Reason for referral: Care Coordination (Outreach to schedule with Pharm d )   Sonia Skinner is a 73 y.o. year old female who is a primary care patient of Joshua Debby CROME, MD.  Sonia Skinner [facility] was referred to the pharmacist for assistance related to: DMII  Successful contact was made with the patient to discuss pharmacy services including being ready for the pharmacist to call at least 5 minutes before the scheduled appointment time and to have medication bottles and any blood pressure readings ready for review. The patient agreed to meet with the pharmacist via telephone visit on (date/time).08/30/2023  Sonia Skinner , RMA     [facility], [facility] Guide  Direct Dial : [phone]  Website: [email or web address]

## 2023-08-30 ENCOUNTER — Other Ambulatory Visit: Payer: 59

## 2023-08-31 ENCOUNTER — Other Ambulatory Visit: Payer: 59

## 2023-08-31 DIAGNOSIS — E785 Hyperlipidemia, unspecified: Secondary | ICD-10-CM

## 2023-08-31 DIAGNOSIS — I10 Essential (primary) hypertension: Secondary | ICD-10-CM

## 2023-08-31 MED ORDER — ATORVASTATIN CALCIUM 40 MG PO TABS: 40.0000 mg | ORAL_TABLET | Freq: Every day | ORAL | 2 refills | Status: DC

## 2023-08-31 NOTE — Progress Notes (Signed)
 08/31/2023 Name: Sonia Skinner MRN: [id number] DOB: 10-02-1950  Chief Complaint  Patient presents with   Hypertension   Medication Management    Sonia Skinner is a 73 y.o. year old female who presented for a telephone visit.   They were referred to the pharmacist by their PCP for assistance in managing hypertension.    Subjective:  Care Team: Primary Care Provider: Arcadio Knuckles, MD ; Next Scheduled Visit: not scheduled  Medication Access/Adherence  Current Pharmacy:  [facility] [address] [place] - [address] [place] [address] Phone: [phone] Fax: [phone]  [facility] - [place], [place] - [address] [place] [address] Phone: [phone] Fax: [phone]   Patient reports affordability concerns with their medications: No  Patient reports access/transportation concerns to their pharmacy: No  Patient reports adherence concerns with their medications:  Yes    Notes she has had some issues with [facility] if a provider doesn't send a refill that is needed. She has been out of atorvastatin  because she needs a refill.   Hypertension:  Current medications: amlodipine  5 mg daily Medications previously tried:   Patient has a validated, automated, upper arm home BP cuff Current blood pressure readings: has not checked  She did have recent 20 lb weight gain after her Ozempic  was reduced because she felt she had lost too much weight and was barely eating.  Objective:  Lab Results  Component Value Date   HGBA1C 9.2 (H) 05/03/2023    Lab Results  Component Value Date   CREATININE 2.2 (A) 04/19/2023   BUN 25 (A) 04/19/2023   NA 139 04/19/2023   K 4.7 04/19/2023   CL 103 04/19/2023   CO2 24 (A) 04/19/2023    Lab Results  Component Value Date   CHOL 115 05/03/2023   HDL 38.70 (L) 05/03/2023   LDLCALC 54 05/03/2023   LDLDIRECT 141 (H) 03/04/2010    TRIG 113.0 05/03/2023   CHOLHDL 3 05/03/2023    Medications Reviewed Today     Reviewed by Dion Frankel, RPH (Pharmacist) on 08/31/23 at 1531  Med List Status: <None>   Medication Order Taking? Sig Documenting Provider Last Dose Status Informant  Accu-Chek FastClix Lancets MISC [id number]  Use to check blood sugar five times daily. Arcadio Knuckles, MD  Active Self, Pharmacy Records  ACCU-CHEK GUIDE test strip [id number]  Use to check blood sugar twice a day. E11.9 Arcadio Knuckles, MD  Active Self, Pharmacy Records  amLODipine  (NORVASC ) 5 MG tablet [id number] Yes Take 1 tablet (5 mg total) by mouth daily. Casimer Clear, FNP Taking Active   aspirin  81 MG chewable tablet [id number] Yes Chew 1 tablet (81 mg total) by mouth daily. Desai, Nikita S, MD Taking Active Self, Pharmacy Records  atorvastatin  (LIPITOR ) 40 MG tablet [id number] No Take 1 tablet (40 mg total) by mouth daily.  Patient not taking: Reported on 08/31/2023   Arcadio Knuckles, MD Not Taking Active   benztropine  (COGENTIN ) 0.5 MG tablet [id number] Yes Take 1 tablet (0.5 mg total) by mouth at bedtime. Arturo Late, MD Taking Active   brexpiprazole  (REXULTI ) 1 MG TABS tablet [id number] Yes Take 1 tablet (1 mg total) by mouth daily. Arfeen, Syed T, MD Taking Active   buPROPion  (WELLBUTRIN  XL) 300 MG 24 hr tablet [id number] Yes Take 1 tablet (300 mg total) by mouth daily. Arfeen, Syed T, MD Taking Active  Continuous Blood Gluc Receiver (DEXCOM G7 RECEIVER) DEVI [id number]  1 Act by Does not apply route daily. Arcadio Knuckles, MD  Active Self, Pharmacy Records  Continuous Glucose Sensor [facility] G7 SENSOR) [place] [id number] Yes 1 Act by Does not apply route daily. Emilie Harden, MD Taking Active   Dexlansoprazole  30 MG capsule DR [id number] Yes Take 1 capsule (30 mg total) by mouth daily. Arcadio Knuckles, MD Taking Active   ezetimibe  (ZETIA ) 10 MG tablet [id number] Yes Take 1 tablet by mouth once daily. Arcadio Knuckles, MD Taking Active    gabapentin  (NEURONTIN ) 100 MG capsule [id number] Yes Take 1 capsule (100 mg total) by mouth 3 (three) times daily. Casimer Clear, FNP Taking Active   HYDROcodone -acetaminophen  (NORCO) 5-325 MG tablet [id number]  Take 1 tablet by mouth every 6 (six) hours as needed for moderate pain (pain score 4-6). Casimer Clear, FNP  Active   insulin  glargine, 1 Unit Dial , (TOUJEO  SOLOSTAR) 300 UNIT/ML Solostar Pen [id number] Yes Inject 26 Units into the skin daily. Emilie Harden, MD Taking Active   insulin  lispro (HUMALOG  KWIKPEN) 200 UNIT/ML KwikPen [id number] Yes Inject 5-8 Units into the skin 2 (two) times daily before a meal. Arcadio Knuckles, MD Taking Active   Insulin  Pen Needle 32G X 4 MM MISC [id number]  Use 3x a day Emilie Harden, MD  Active   levothyroxine  (SYNTHROID ) 88 MCG tablet [id number] Yes Take 1 tablet by mouth daily. Emilie Harden, MD Taking Active   lubiprostone  (AMITIZA ) 24 MCG capsule [id number]  Take 1 capsule (24 mcg total) by mouth 2 (two) times daily with a meal. Arcadio Knuckles, MD  Active Self, Pharmacy Records  Semaglutide ,0.25 or 0.5MG /DOS, (OZEMPIC , 0.25 OR 0.5 MG/DOSE,) 2 MG/3ML SOPN [id number] Yes Inject 0.5 mg into the skin once a week. Emilie Harden, MD Taking Active   traZODone  (DESYREL ) 50 MG tablet [id number]  Take 1 tablet (50 mg total) by mouth at bedtime as needed for sleep. Arturo Late, MD  Active               Assessment/Plan:   Hypertension: - Currently uncontrolled, BP goal < 130/80 - Reviewed appropriate blood pressure monitoring technique and reviewed goal blood pressure. Recommended to check home blood pressure and heart rate daily - Recommend to continue amlodipine  5 mg daily. Need to get BP readings to determine if med adjustment is needed.     Will send atorvastatin  refill  Follow Up Plan: She is planning to schedule an appt with Trevor Fudge to recheck stye, would like to wait until after that appt to schedule f/u  Rainelle Bur, PharmD, BCPS, CPP Clinical Pharmacist Practitioner [facility] [phone]

## 2023-08-31 NOTE — Patient Instructions (Signed)
 It was a pleasure speaking with you today!  Check blood pressures at home and keep a log for us  to review at next follow up.  Feel free to call with any questions or concerns!  Rainelle Bur, PharmD, BCPS, CPP Clinical Pharmacist Practitioner [facility] at [facility] [phone]

## 2023-09-03 ENCOUNTER — Other Ambulatory Visit: Payer: Self-pay | Admitting: Internal Medicine

## 2023-09-03 DIAGNOSIS — E11649 Type 2 diabetes mellitus with hypoglycemia without coma: Secondary | ICD-10-CM

## 2023-09-05 NOTE — Telephone Encounter (Signed)
Ozempic refill request complete

## 2023-09-06 ENCOUNTER — Encounter: Payer: Self-pay | Admitting: Internal Medicine

## 2023-09-06 ENCOUNTER — Ambulatory Visit (INDEPENDENT_AMBULATORY_CARE_PROVIDER_SITE_OTHER): Payer: 59 | Admitting: Internal Medicine

## 2023-09-06 VITALS — BP 130/70 | HR 53 | Ht 62.0 in | Wt 143.2 lb

## 2023-09-06 DIAGNOSIS — E042 Nontoxic multinodular goiter: Secondary | ICD-10-CM | POA: Diagnosis not present

## 2023-09-06 DIAGNOSIS — E11649 Type 2 diabetes mellitus with hypoglycemia without coma: Secondary | ICD-10-CM | POA: Diagnosis not present

## 2023-09-06 DIAGNOSIS — E118 Type 2 diabetes mellitus with unspecified complications: Secondary | ICD-10-CM | POA: Diagnosis not present

## 2023-09-06 DIAGNOSIS — Z794 Long term (current) use of insulin: Secondary | ICD-10-CM | POA: Diagnosis not present

## 2023-09-06 DIAGNOSIS — E039 Hypothyroidism, unspecified: Secondary | ICD-10-CM

## 2023-09-06 LAB — POCT GLYCOSYLATED HEMOGLOBIN (HGB A1C): Hemoglobin A1C: 11 % — AB (ref 4.0–5.6)

## 2023-09-06 MED ORDER — OZEMPIC (0.25 OR 0.5 MG/DOSE) 2 MG/3ML ~~LOC~~ SOPN: 0.2500 mg | PEN_INJECTOR | SUBCUTANEOUS | 3 refills | Status: DC

## 2023-09-06 NOTE — Patient Instructions (Addendum)
Please increase: - Toujeo 40 units daily and inject in lateral abdomen or upper thighs - Humalog 7-10 units 15 min before meals  Change:  - Ozempic 0.25 mg weekly in a.m.  Switch from sugar to stevia. STOP popsicles.  Please continue levothyroxine 88 mcg daily.  Take the thyroid hormone every day, with water, at least 30 minutes before breakfast, separated by at least 4 hours from: - acid reflux medications - calcium - iron - multivitamins  Please return in 1.5 months.

## 2023-09-06 NOTE — Progress Notes (Signed)
MEN patient ID: Sonia Skinner, female   DOB: 05-24-1951, 73 y.o.   MRN: [id number]  HPI: Sonia Skinner is a 73 y.o.-year-old female, returning for follow-up for DM2, dx in 1962, insulin-dependent since 2000, uncontrolled, with complications (CVA, left carotid stenosis, PAD, CKD stage IV, PN, history of hypoglycemia, history of nonketotic hyperglycemic state). Pt. previously saw Dr. Everardo All, but last visit with me   Interim history: No increased urination, blurry vision, nausea, chest pain.  She gained 16 lbs since last OV.  She mentions that she is eating a lot of popsicles. She was able to restart Ozempic since last visit - intermittently - 0.5 mg every other week as she could not tolerate the weekly dose due to drastically reduced appetite  Reviewed HbA1c: Lab Results  Component Value Date   HGBA1C 9.2 (H) 05/03/2023   HGBA1C 8.1 (A) 12/31/2022   HGBA1C 8.9 (H) 03/29/2022   HGBA1C 8.1 (H) 02/26/2022   HGBA1C 7.8 (H) 11/14/2021   HGBA1C 8.0 (A) 11/02/2021   HGBA1C 8.5 (H) 08/15/2021   HGBA1C 7.2 (H) 04/07/2021   HGBA1C 10.2 (H) 04/01/2020   HGBA1C 7.3 (A) 06/20/2019   She was on a regimen of: - Farxiga 10 mg before breakfast - started 08/2021 >> stopped by her nephrologist, Dr. Melanee Spry - Ozempic 2 mg weekly  >> stopped 01/2023 due to significantly decreased appetite "I could not eat anything" - Humalog 3-5 units 15 min before a meal with more carbs - Toujeo 30 >> 20-22 >> 26 units daily She was previously on Basaglar, then NPH.  At last visit I recommended the following regimen: - Toujeo 26 >> 30 units daily - Humalog 5-7 units 15 min before meals >> takes it AFTER the meals  - Ozempic 0.5 mg weekly in a.m.  Pt checks her sugars more than 4 times a day with her Dexcom CGM:    Previously:   Previously:   Lowest sugar was 40 >> 68 >> 66; she has hypoglycemia awareness at 70.  Highest sugar was 300 >> 350 >> 400.  In 2017, she had nonketotic hyperglycemic state. She was  admitted 01/2023 with AKI + hyperkalemia due to using both Ozempic and [place].  At that time it was also noted that she had nausea/vomiting/poor oral intake and presyncope while on Ozempic.  She was taken off both Ozempic and Farxiga at that time.  Glucometer: Accu-Chek guide  Meals: - coffee + sugar at 10 am - takes 6 units after coffee - lunch: fried chicken + yeast rolls, etc - dinner: meat + potatoes + collard greens Popsicles vanilla + chocolate.  - + CKD stage IV - sees Dr. Melanee Spry (nephrology), last BUN/creatinine:  Lab Results  Component Value Date   BUN 25 (A) 04/19/2023   BUN 21 01/27/2023   CREATININE 2.2 (A) 04/19/2023   CREATININE 2.15 (H) 01/27/2023   Lab Results  Component Value Date   MICRALBCREAT 2 04/20/2023   MICRALBCREAT 3 11/02/2022   MICRALBCREAT 53 11/02/2022   MICRALBCREAT 0.9 03/29/2022   MICRALBCREAT 1.0 02/25/2022   MICRALBCREAT 1.1 04/07/2021   MICRALBCREAT 7 04/01/2020   MICRALBCREAT 1.3 02/20/2018   MICRALBCREAT 2.0 12/20/2016  Not on ACE inhibitor/ARB.  -+ HL; last set of lipids: Lab Results  Component Value Date   CHOL 115 05/03/2023   HDL 38.70 (L) 05/03/2023   LDLCALC 54 05/03/2023   LDLDIRECT 141 (H) 03/04/2010   TRIG 113.0 05/03/2023   CHOLHDL 3 05/03/2023  On Lipitor 40 mg daily.  On Zetia 10 mg daily.  - last eye exam was 12/2022. No DR reportedly.   - no numbness and tingling in her feet.  Foot exam 05/03/2023.  She is on gabapentin 100 mg 3 times a day.  She has a history of multinodular goiter, diagnosed in 2017.  Latest thyroid ultrasound (11/09/2021) reviewed: Parenchymal Echotexture: Moderately heterogenous  Isthmus: Normal in size measuring 0.3 cm in diameter, unchanged  Right lobe: Normal in size measuring 5.0 x 1.8 x 1.0 cm, previously, 4.9 x 1.7 x 1.4 cm  Left lobe: Normal in size measuring 4.8 x 1.6 x 1.6 cm, previously, 5.3 x 2.0 x 1.9 cm _________________________________________________________   The approximately  1.2 x 1.0 x 0.9 cm isoechoic ill-defined nodule within the mid aspect the right lobe of the thyroid (labeled 1), is grossly unchanged compared to the 05/2016 examination, previously, 1.2 x 1.1 x 0.9 cm and is again noted to contain an internal partially shadowing macrocalcification. Imaging stability for greater than 5 years is indicative of a benign etiology.   The approximately 1.0 x 1.0 x 0.5 cm spongiform/benign-appearing nodule within the inferior, medial aspect of the right lobe of the thyroid (labeled 2), is grossly unchanged compared to the 05/2016 examination, previously, 1.0 cm, and again does not meet criteria to recommend percutaneous sampling or continued dedicated follow-up.  _________________________________________________________   The approximately 2.5 x 1.5 x 1.3 cm isoechoic nodule within mid aspect the left lobe of the thyroid (labeled 3), is unchanged compared to the 05/2016 examination, previously, 2.5 x 1.8 x 1.3 cm and is again noted to contain an internal partially shadowing macrocalcification. Imaging stability for greater than 5 years is indicative of a benign etiology.   IMPRESSION: 1. Similar findings of multinodular goiter. No worrisome new or enlarging thyroid nodules. 2. All discretely thyroid nodules are unchanged since the 05/2016 examination. By report, dominant bilateral thyroid nodules may have been biopsied at an outside institution. If so, correlation with previous biopsy results is advised. Otherwise, imaging stability for greater than 5 years is indicative of a benign etiology and as such percutaneous sampling and/or continued dedicated follow-up is not recommended.  Hypothyroidism:  She is on Synthroid 88 mcg daily: - in am - fasting - right before b'fast >> moved 30 minutes before breakfast - no calcium - no iron - no multivitamins - + PPIs  - Dexilant in am 30 min to 1h >> moved later in the day - not on Biotin  Reviewed most  recent TFTs: Lab Results  Component Value Date   TSH 1.09 05/03/2023   TSH 0.368 01/19/2023   TSH 4.23 03/29/2022   TSH 3.75 02/25/2022   TSH 1.088 11/14/2021   TSH 11.69 (H) 11/02/2021   TSH 0.25 (L) 08/27/2021   TSH 0.053 (L) 08/15/2021   TSH 33.15 (H) 04/07/2021   TSH 4.06 04/01/2020   She is not on steroids or biotin.  She also has a history of GERD, headache, HTN, depression. She was admitted 08/12/2022 with SBO.  ROS: + see HPI Past Medical History:  Diagnosis Date   Ankle fracture    Left   Anxiety    Carotid artery occlusion    Closed fracture of left distal fibula 09/16/2017   Complication of anesthesia    ALLERY TO ESTER BASE   Depression    early 30s   Diabetes mellitus    INSULIN DEPENDENT   GERD (gastroesophageal reflux disease)    Headache    years ago  Hypertension    Pneumonia    Stroke [facility]) March 2015    left MCA infarct, slight weakness on left side   Thyroid disease    Past Surgical History:  Procedure Laterality Date   ABDOMINAL HYSTERECTOMY     ANTERIOR FIXATION AND POSTERIOR MICRODISCECTOMY CERVICAL SPINE  1999   CAROTID ENDARTERECTOMY Left 11-04-13   cea   ENDARTERECTOMY Left 11/04/2013   IR ANGIO INTRA EXTRACRAN SEL COM CAROTID INNOMINATE BILAT MOD SED  11/16/2021   IR ANGIO INTRA EXTRACRAN SEL COM CAROTID INNOMINATE UNI L MOD SED  11/19/2021   IR ANGIO VERTEBRAL SEL VERTEBRAL BILAT MOD SED  11/16/2021   IR CT HEAD LTD  11/19/2021   IR INTRAVSC STENT CERV CAROTID W/EMB-PROT MOD SED INCL ANGIO  11/19/2021   IR RADIOLOGIST EVAL & MGMT  12/13/2021   ORIF ANKLE FRACTURE Left 09/16/2017   Procedure: OPEN REDUCTION INTERNAL FIXATION (ORIF) LEFT ANKLE FRACTURE;  Surgeon: Teryl Lucy, MD;  Location: [facility] OR;  Service: Orthopedics;  Laterality: Left;   RADIOLOGY WITH ANESTHESIA N/A 11/19/2021   Procedure: Left carotid angioplasty with possible stenting;  Surgeon: Julieanne Cotton, MD;  Location: [facility] OR;  Service: Radiology;  Laterality: N/A;   Social  History   Socioeconomic History   Marital status: Legally Separated    Spouse name: Not on file   Number of children: 0   Years of education: College   Highest education level: Not on file  Occupational History   Occupation: [employer]    Employer: [employer]  Tobacco Use   Smoking status: Former    Current packs/day: 0.00    Average packs/day: 1 pack/day for 20.0 years (20.0 ttl pk-yrs)    Types: Cigarettes    Start date: 11/01/1993    Quit date: 11/01/2013    Years since quitting: 9.8   Smokeless tobacco: Never  Vaping Use   Vaping status: Never Used  Substance and Sexual Activity   Alcohol use: Not Currently    Alcohol/week: 1.0 - 2.0 standard drink of alcohol    Types: 1 - 2 Standard drinks or equivalent per week   Drug use: No   Sexual activity: Not Currently  Other Topics Concern   Not on file  Social History Narrative   Patient lives in 1 story home with sister.   Caffeine Use: 2 cups daily; sodas occasionally   Some college education   Works as Lawyer for [employer]   Social Drivers of Health   Financial Resource Strain: Low Risk  (05/18/2023)   Overall Financial Resource Strain (CARDIA)    Difficulty of Paying Living Expenses: Not hard at all  Food Insecurity: No Food Insecurity (05/18/2023)   Hunger Vital Sign    Worried About Running Out of Food in the Last Year: Never true    Ran Out of Food in the Last Year: Never true  Transportation Needs: No Transportation Needs (05/18/2023)   PRAPARE - Administrator, Civil Service (Medical): No    Lack of Transportation (Non-Medical): No  Physical Activity: Insufficiently Active (05/18/2023)   Exercise Vital Sign    Days of Exercise per Week: 4 days    Minutes of Exercise per Session: 30 min  Stress: No Stress Concern Present (05/18/2023)   [employer] of Occupational Health - Occupational Stress Questionnaire    Feeling of Stress : Only a little  Social Connections: Moderately Integrated  (05/18/2023)   Social Connection and Isolation Panel [NHANES]    Frequency of Communication  with Friends and Family: More than three times a week    Frequency of Social Gatherings with Friends and Family: More than three times a week    Attends Religious Services: More than 4 times per year    Active Member of [employer] or Organizations: Yes    Attends Banker Meetings: More than 4 times per year    Marital Status: Separated  Intimate Partner Violence: Not At Risk (05/18/2023)   Humiliation, Afraid, Rape, and Kick questionnaire    Fear of Current or Ex-Partner: No    Emotionally Abused: No    Physically Abused: No    Sexually Abused: No   Current Outpatient Medications on File Prior to Visit  Medication Sig Dispense Refill   Accu-Chek FastClix Lancets MISC Use to check blood sugar five times daily. 510 each 2   ACCU-CHEK GUIDE test strip Use to check blood sugar twice a day. E11.9 100 each 2   amLODipine (NORVASC) 5 MG tablet Take 1 tablet (5 mg total) by mouth daily. 90 tablet 0   aspirin 81 MG chewable tablet Chew 1 tablet (81 mg total) by mouth daily. 30 tablet 1   atorvastatin (LIPITOR) 40 MG tablet Take 1 tablet (40 mg total) by mouth daily. 30 tablet 2   benztropine (COGENTIN) 0.5 MG tablet Take 1 tablet (0.5 mg total) by mouth at bedtime. 90 tablet 0   brexpiprazole (REXULTI) 1 MG TABS tablet Take 1 tablet (1 mg total) by mouth daily. 90 tablet 0   buPROPion (WELLBUTRIN XL) 300 MG 24 hr tablet Take 1 tablet (300 mg total) by mouth daily. 90 tablet 0   Continuous Blood Gluc Receiver (DEXCOM G7 RECEIVER) DEVI 1 Act by Does not apply route daily. 2 each 5   Continuous Glucose Sensor (DEXCOM G7 SENSOR) MISC 1 Act by Does not apply route daily. 6 each 3   Dexlansoprazole 30 MG capsule DR Take 1 capsule (30 mg total) by mouth daily. 90 capsule 0   ezetimibe (ZETIA) 10 MG tablet Take 1 tablet by mouth once daily. 90 tablet 0   gabapentin (NEURONTIN) 100 MG capsule Take 1 capsule  (100 mg total) by mouth 3 (three) times daily. 270 capsule 1   HYDROcodone-acetaminophen (NORCO) 5-325 MG tablet Take 1 tablet by mouth every 6 (six) hours as needed for moderate pain (pain score 4-6). 100 tablet 0   insulin glargine, 1 Unit Dial, (TOUJEO SOLOSTAR) 300 UNIT/ML Solostar Pen Inject 26 Units into the skin daily. 9 mL 3   insulin lispro (HUMALOG KWIKPEN) 200 UNIT/ML KwikPen Inject 5-8 Units into the skin 2 (two) times daily before a meal. 12 mL 0   Insulin Pen Needle 32G X 4 MM MISC Use 3x a day 300 each 3   levothyroxine (SYNTHROID) 88 MCG tablet Take 1 tablet by mouth daily. 90 tablet 1   lubiprostone (AMITIZA) 24 MCG capsule Take 1 capsule (24 mcg total) by mouth 2 (two) times daily with a meal. 180 capsule 1   OZEMPIC, 0.25 OR 0.5 MG/DOSE, 2 MG/3ML SOPN INJECT 0.25MG  INTO THE SKIN ONCE A WEEK 3 mL 0   traZODone (DESYREL) 50 MG tablet Take 1 tablet (50 mg total) by mouth at bedtime as needed for sleep. 90 tablet 0   No current facility-administered medications on file prior to visit.   Allergies  Allergen Reactions   Anesthetics, Ester Anaphylaxis   Family History  Problem Relation Age of Onset   Diabetes Mother    Heart disease  Mother        Before age 45   Cancer Father        Lung   Hypertension Sister    Diabetes Sister    Diabetes Sister    Diabetes Sister    PE: BP 130/70   Pulse (!) 53   Ht 5\' 2"  (1.575 m)   Wt 143 lb 3.2 oz (65 kg)   SpO2 94%   BMI 26.19 kg/m  Wt Readings from Last 10 Encounters:  09/06/23 143 lb 3.2 oz (65 kg)  08/16/23 146 lb 3.2 oz (66.3 kg)  05/18/23 127 lb (57.6 kg)  05/04/23 127 lb 12.8 oz (58 kg)  05/03/23 127 lb (57.6 kg)  01/27/23 116 lb (52.6 kg)  01/18/23 123 lb 7.3 oz (56 kg)  12/31/22 123 lb 12.8 oz (56.2 kg)  12/27/22 123 lb 12.8 oz (56.2 kg)  08/31/22 116 lb (52.6 kg)   Constitutional: normal weight, in NAD Eyes: EOMI, no exophthalmos ENT: no thyromegaly, no cervical lymphadenopathy Cardiovascular: RRR, No  MRG Respiratory: CTA B Musculoskeletal: no deformities Skin: no rashes Neurological: no tremor with outstretched hands  ASSESSMENT: 1. DM2, insulin-dependent, uncontrolled, with complications - CVA - carotid stenosis - PAD - CKD stage 4 - PN - Hypoglycemia - Nonketotic hyperglycemic state in 2017  2.  Hypothyroidism  3.  Multinodular goiter  PLAN:  1. Patient with longstanding, uncontrolled, type 2 diabetes, on basal/bolus insulin along with weekly GLP-1 receptor agonist, with poor control.  Latest HbA1c from 4 was 9.2%.  At that time, she was off Ozempic and [place] after being admitted with AKI and hyperkalemia in the setting of nausea/vomiting/poor oral intake and dehydration.  Sugars improved in the 2 weeks prior to the appointment but they were still higher after meals, up to 300s.  We increased her dose of Humalog at that time and, since her GI symptoms were resolved, we restarted a low-dose of Ozempic.  We discussed that if her appetite foods decreased considerably to let me know to stop Ozempic. CGM interpretation:  -At today's visit, we reviewed her CGM downloads: It appears that 20% of values are in target range (goal >70%), while 79% are higher than 180 (goal <25%), and 1% are lower than 70 (goal <4%).  The calculated average blood sugar is 244.  The projected HbA1c for the next 3 months (GMI) is 9.1%. -Reviewing the CGM trends, sugars are very high, fluctuating almost entirely above the target range with a significant hyperglycemic peak after her coffee in the morning (she does not eat breakfast) in which she blood sugar.  She mentions that she is giving herself insulin for the coffee but after she drinks it.  In fact, upon questioning, she takes Humalog after her meals despite advised to take it 15 minutes before each meal.  We discussed again about the importance of moving the boluses for all of the insulin to be absorbed before the meal comes in.  I did advise her to increase  the dose of Humalog and also Toujeo and to change the injection sites from center abdomen to lateral abdomen and upper thighs.  I also advised her to stop putting sugar in her coffee and maybe use Stevia.  Last, but not obese, she absolutely buying the popsicles/ice cream. -As she could not tolerate Ozempic 0.5 mg weekly, I advised her to take 0.25 mg weekly. - I suggested to:  Patient Instructions  Please increase: - Toujeo 40 units daily and inject in lateral abdomen  or upper thighs - Humalog 7-10 units 15 min before meals  Change:  - Ozempic 0.25 mg weekly in a.m.  Switch from sugar to stevia. STOP popsicles.  Please continue levothyroxine 88 mcg daily.  Take the thyroid hormone every day, with water, at least 30 minutes before breakfast, separated by at least 4 hours from: - acid reflux medications - calcium - iron - multivitamins  Please return in 1.5 months.  - we checked her HbA1c: 11% (very high) - advised to check sugars at different times of the day - 4x a day, rotating check times - advised for yearly eye exams >> she is UTD - return to clinic in 1.5 months  2.  Hypothyroidism - latest thyroid labs reviewed with pt. >> normal: Lab Results  Component Value Date   TSH 1.09 05/03/2023  - she continues on LT4 88 mcg daily - pt feels good on this dose. - we discussed about taking the thyroid hormone every day, with water, >30 minutes before breakfast, separated by >4 hours from acid reflux medications, calcium, iron, multivitamins. Pt. is taking it correctly. - will check thyroid tests at next visit  3.  Multinodular goiter -No neck compression symptoms -Most recent thyroid ultrasound showed stability of her nodules over 6 years -No further imaging is needed for this, we will continue to follow her clinically  Carlus Pavlov, MD PhD [facility] Endocrinology

## 2023-09-08 ENCOUNTER — Other Ambulatory Visit: Payer: Self-pay | Admitting: Internal Medicine

## 2023-09-08 ENCOUNTER — Other Ambulatory Visit (HOSPITAL_COMMUNITY): Payer: Self-pay | Admitting: Psychiatry

## 2023-09-08 DIAGNOSIS — E785 Hyperlipidemia, unspecified: Secondary | ICD-10-CM

## 2023-09-08 DIAGNOSIS — R251 Tremor, unspecified: Secondary | ICD-10-CM

## 2023-09-08 DIAGNOSIS — F411 Generalized anxiety disorder: Secondary | ICD-10-CM

## 2023-09-08 DIAGNOSIS — F331 Major depressive disorder, recurrent, moderate: Secondary | ICD-10-CM

## 2023-09-20 ENCOUNTER — Encounter (HOSPITAL_COMMUNITY): Payer: Self-pay

## 2023-09-20 DIAGNOSIS — F331 Major depressive disorder, recurrent, moderate: Secondary | ICD-10-CM

## 2023-09-20 DIAGNOSIS — F411 Generalized anxiety disorder: Secondary | ICD-10-CM

## 2023-09-20 DIAGNOSIS — R251 Tremor, unspecified: Secondary | ICD-10-CM

## 2023-09-21 ENCOUNTER — Ambulatory Visit: Payer: 59 | Admitting: Internal Medicine

## 2023-09-21 MED ORDER — BENZTROPINE MESYLATE 0.5 MG PO TABS: 0.5000 mg | ORAL_TABLET | Freq: Every day | ORAL | 0 refills | Status: DC

## 2023-09-21 MED ORDER — BUPROPION HCL ER (XL) 300 MG PO TB24: 300.0000 mg | ORAL_TABLET | Freq: Every day | ORAL | 0 refills | Status: DC

## 2023-09-21 MED ORDER — BREXPIPRAZOLE 1 MG PO TABS: 1.0000 mg | ORAL_TABLET | Freq: Every day | ORAL | 0 refills | Status: DC

## 2023-09-21 NOTE — Telephone Encounter (Signed)
I sent 90-day prescription of Cogentin, Wellbutrin and Rexulti to pill pack.  She need to have in person visit or video visit.

## 2023-09-21 NOTE — Telephone Encounter (Signed)
 I called patient and let her know that the medication had been sent in and I reminded her of her appointment on 2/14

## 2023-09-23 ENCOUNTER — Ambulatory Visit (INDEPENDENT_AMBULATORY_CARE_PROVIDER_SITE_OTHER): Payer: 59 | Admitting: Family Medicine

## 2023-09-23 ENCOUNTER — Encounter: Payer: Self-pay | Admitting: Family Medicine

## 2023-09-23 VITALS — BP 140/64 | HR 89 | Temp 98.7°F | Ht 62.0 in | Wt 147.0 lb

## 2023-09-23 DIAGNOSIS — H00014 Hordeolum externum left upper eyelid: Secondary | ICD-10-CM | POA: Diagnosis not present

## 2023-09-23 DIAGNOSIS — H01004 Unspecified blepharitis left upper eyelid: Secondary | ICD-10-CM | POA: Diagnosis not present

## 2023-09-23 MED ORDER — ERYTHROMYCIN 5 MG/GM OP OINT: 1.0000 | TOPICAL_OINTMENT | Freq: Every day | OPHTHALMIC | 0 refills | Status: AC

## 2023-09-23 NOTE — Progress Notes (Signed)
   Established Patient Office Visit  Subjective   Patient ID: Sonia Skinner, female    DOB: 1951/02/02  Age: 73 y.o. MRN: [id number]  Chief Complaint  Patient presents with   Follow-up    Left eye     HPI Patient presents today for follow up of stye to L upper eyelid. Reports that the area is mildly tender, draining thicker clear fluid at times.  Not fasting today. Denies other concerns. Medical history as outlined below.  ROS Per HPI    Objective:     BP (!) 140/64 (BP Location: Left Arm, Patient Position: Sitting, Cuff Size: Normal)   Pulse 89   Temp 98.7 F (37.1 C) (Oral)   Ht 5' 2 (1.575 m)   Wt 147 lb (66.7 kg)   SpO2 95%   BMI 26.89 kg/m   Physical Exam Vitals and nursing note reviewed.  Constitutional:      General: She is not in acute distress.    Appearance: Normal appearance. She is normal weight.  HENT:     Head: Normocephalic and atraumatic.     Nose: Nose normal.  Eyes:     Extraocular Movements: Extraocular movements intact.     Pupils: Pupils are equal, round, and reactive to light.      Comments: Area of mildly tender hordeolum, no current discharge or bleeding  Pulmonary:     Effort: Pulmonary effort is normal.  Musculoskeletal:     Cervical back: Normal range of motion.  Neurological:     General: No focal deficit present.     Mental Status: She is alert and oriented to person, place, and time.  Psychiatric:        Mood and Affect: Mood normal.        Thought Content: Thought content normal.     No results found for any visits on 09/23/23.   The ASCVD Risk score (Arnett DK, et al., 2019) failed to calculate for the following reasons:   Risk score cannot be calculated because patient has a medical history suggesting prior/existing ASCVD    Assessment & Plan:   Blepharitis of left upper eyelid, unspecified type -     Erythromycin ; Place 1 Application into the left eye at bedtime for 3 days.  Dispense: 3.5 g; Refill: 0  Hordeolum  externum of left upper eyelid     Return if symptoms worsen or fail to improve.    Corean Ku, FNP

## 2023-09-23 NOTE — Patient Instructions (Signed)
 I have sent in erythromycin  ointment for you to use at bedtime for the next 3 days.  Follow-up with me for new or worsening symptoms.

## 2023-09-30 ENCOUNTER — Telehealth (HOSPITAL_COMMUNITY): Payer: 59 | Admitting: Psychiatry

## 2023-10-04 ENCOUNTER — Telehealth (HOSPITAL_BASED_OUTPATIENT_CLINIC_OR_DEPARTMENT_OTHER): Payer: 59 | Admitting: Psychiatry

## 2023-10-04 ENCOUNTER — Encounter (HOSPITAL_COMMUNITY): Payer: Self-pay | Admitting: Psychiatry

## 2023-10-04 VITALS — Wt 147.0 lb

## 2023-10-04 DIAGNOSIS — F411 Generalized anxiety disorder: Secondary | ICD-10-CM | POA: Diagnosis not present

## 2023-10-04 DIAGNOSIS — F331 Major depressive disorder, recurrent, moderate: Secondary | ICD-10-CM | POA: Diagnosis not present

## 2023-10-04 DIAGNOSIS — R251 Tremor, unspecified: Secondary | ICD-10-CM | POA: Diagnosis not present

## 2023-10-04 MED ORDER — BUPROPION HCL ER (XL) 300 MG PO TB24: 300.0000 mg | ORAL_TABLET | Freq: Every day | ORAL | 0 refills | Status: DC

## 2023-10-04 MED ORDER — BREXPIPRAZOLE 1 MG PO TABS
1.0000 mg | ORAL_TABLET | Freq: Every day | ORAL | 0 refills | Status: DC
Start: 2023-10-04 — End: 2024-02-21

## 2023-10-04 MED ORDER — TRAZODONE HCL 50 MG PO TABS: 50.0000 mg | ORAL_TABLET | Freq: Every evening | ORAL | 0 refills | Status: DC | PRN

## 2023-10-04 MED ORDER — BENZTROPINE MESYLATE 0.5 MG PO TABS: 0.5000 mg | ORAL_TABLET | Freq: Every day | ORAL | 0 refills | Status: DC

## 2023-10-04 NOTE — Progress Notes (Signed)
 [facility] MD Virtual Progress Note   Patient Location: Home Provider Location: Home Office  I connect with patient by telephone and verified that I am speaking with correct person by using two identifiers. I discussed the limitations of evaluation and management by telemedicine and the availability of in person appointments. I also discussed with the patient that there may be a patient responsible charge related to this service. The patient expressed understanding and agreed to proceed.  Sonia Skinner [id number] 73 y.o.  10/04/2023 8:51 AM  History of Present Illness:  Patient is evaluated by phone session.  Patient told that she cannot do video at this time.  We have requested in person visit last time but patient forgot.  She also ran out of trazodone and not sleeping well.  Patient told that she continue to have chronic symptoms of depression and anxiety.  Now she is not sleeping very well.  She reported lack of energy and fatigue.  She is on Ozempic and she lost significant weight but now the dose cut down and she is slowly and gradually getting her weight back.  However still has very high blood sugar and last hemoglobin A1c 11.2.  Patient has appointment coming up see endocrinologist.  Her last creatinine was 2.2.  Patient not interested in therapy.  She reported stressors as husband has leg amputee and she is a primary caretaker.  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  She has no tremors or shakes.  She reported hopelessness and worthlessness and sometimes feels the medicine is not working.  She is compliant with Rexulti, Wellbutrin, Cogentin but out of the trazodone.  Past Psychiatric History: H/O taking antidepressant on and off most of life. H/O overdose and inpatient at [facility]. Saw provider at [facility] and given the Strattera, Adderall, Zoloft, Celexa, Lamictal and Lexapro.  Never tested for ADHD. No h/o psychosis, mania or hallucination.  In  2001 moved to [facility] and given amitriptyline and Wellbutrin to stop smoking. Prozac and Paxil made tired and nausea.     Outpatient Encounter Medications as of 10/04/2023  Medication Sig   Accu-Chek FastClix Lancets MISC Use to check blood sugar five times daily. (Patient not taking: Reported on 09/06/2023)   ACCU-CHEK GUIDE test strip Use to check blood sugar twice a day. E11.9 (Patient not taking: Reported on 09/06/2023)   amLODipine (NORVASC) 5 MG tablet Take 1 tablet (5 mg total) by mouth daily.   aspirin 81 MG chewable tablet Chew 1 tablet (81 mg total) by mouth daily.   atorvastatin (LIPITOR) 40 MG tablet Take 1 tablet (40 mg total) by mouth daily.   benztropine (COGENTIN) 0.5 MG tablet Take 1 tablet (0.5 mg total) by mouth at bedtime.   brexpiprazole (REXULTI) 1 MG TABS tablet Take 1 tablet (1 mg total) by mouth daily.   buPROPion (WELLBUTRIN XL) 300 MG 24 hr tablet Take 1 tablet (300 mg total) by mouth daily.   Continuous Blood Gluc Receiver (DEXCOM G7 RECEIVER) DEVI 1 Act by Does not apply route daily.   Continuous Glucose Sensor (DEXCOM G7 SENSOR) MISC 1 Act by Does not apply route daily.   Dexlansoprazole 30 MG capsule DR Take 1 capsule (30 mg total) by mouth daily.   ezetimibe (ZETIA) 10 MG tablet Take 1 tablet by mouth once daily.   gabapentin (NEURONTIN) 100 MG capsule Take 1 capsule (100 mg total) by mouth 3 (three) times daily.   HYDROcodone-acetaminophen (NORCO) 5-325 MG tablet Take 1  tablet by mouth every 6 (six) hours as needed for moderate pain (pain score 4-6).   insulin glargine, 1 Unit Dial, (TOUJEO SOLOSTAR) 300 UNIT/ML Solostar Pen Inject 26 Units into the skin daily.   insulin lispro (HUMALOG KWIKPEN) 200 UNIT/ML KwikPen Inject 5-8 Units into the skin 2 (two) times daily before a meal. (Patient taking differently: Inject 5-8 Units into the skin 2 (two) times daily before a meal. 8-10 units in the skin twice daily.)   Insulin Pen Needle 32G X 4 MM MISC Use 3x a day    levothyroxine (SYNTHROID) 88 MCG tablet Take 1 tablet by mouth daily.   lubiprostone (AMITIZA) 24 MCG capsule Take 1 capsule (24 mcg total) by mouth 2 (two) times daily with a meal.   Semaglutide,0.25 or 0.5MG /DOS, (OZEMPIC, 0.25 OR 0.5 MG/DOSE,) 2 MG/3ML SOPN Inject 0.25 mg into the skin once a week.   traZODone (DESYREL) 50 MG tablet Take 1 tablet (50 mg total) by mouth at bedtime as needed for sleep.   No facility-administered encounter medications on file as of 10/04/2023.    Recent Results (from the past 2160 hours)  POCT glycosylated hemoglobin (Hb A1C)     Status: Abnormal   Collection Time: 09/06/23 10:34 AM  Result Value Ref Range   Hemoglobin A1C 11.0 (A) 4.0 - 5.6 %   HbA1c POC (<> result, manual entry)     HbA1c, POC (prediabetic range)     HbA1c, POC (controlled diabetic range)       Psychiatric Specialty Exam: Physical Exam  Review of Systems  Weight 147 lb (66.7 kg).There is no height or weight on file to calculate BMI.  General Appearance: NA  Eye Contact:  NA  Speech:  Slow  Volume:  Decreased  Mood:  Anxious and Dysphoric  Affect:  NA  Thought Process:  Descriptions of Associations: Intact  Orientation:  Full (Time, Place, and Person)  Thought Content:  Rumination  Suicidal Thoughts:  No  Homicidal Thoughts:  No  Memory:  Immediate;   Fair Recent;   Fair Remote;   Fair  Judgement:  Fair  Insight:  Present  Psychomotor Activity:  Decreased  Concentration:  Concentration: Fair and Attention Span: Fair  Recall:  Good  Fund of Knowledge:  Good  Language:  Good  Akathisia:  No  Handed:  Right  AIMS (if indicated):     Assets:  Communication Skills Desire for Improvement Housing Talents/Skills Transportation  ADL's:  Intact  Cognition:  WNL  Sleep:  poor     Assessment/Plan: Moderate episode of recurrent major depressive disorder (HCC) - Plan: benztropine (COGENTIN) 0.5 MG tablet, brexpiprazole (REXULTI) 1 MG TABS tablet, buPROPion (WELLBUTRIN  XL) 300 MG 24 hr tablet, traZODone (DESYREL) 50 MG tablet  Tremor - Plan: benztropine (COGENTIN) 0.5 MG tablet  GAD (generalized anxiety disorder) - Plan: brexpiprazole (REXULTI) 1 MG TABS tablet, buPROPion (WELLBUTRIN XL) 300 MG 24 hr tablet  I reviewed blood work results.  Hemoglobin A1c jumped 11.2.  She had lost weight but her blood sugar not coming down.  Now she has to talk to her endocrinologist to take medication.  I explained insomnia could be not compliant with trazodone.  We discussed other treatment options and recommended to have in person visit.  Patient agreed to stay on same medication and she will start taking trazodone.  If symptoms do not improve then I encouraged to have in person visit for other treatment options.  Patient not interested in therapy.  Her tremors  are not as bad as taking Cogentin.  Continue Rexulti 1 mg at bedtime, Wellbutrin XL 300 mg daily, Cogentin 0.5 mg at bedtime and she will pick up the trazodone 50 mg.  Patient promised to have in person visit next time.  Will follow-up in 3 months unless patient needs an earlier appointment.  Recommended to call us back if she is any question or any concern.  Encourage to keep appointment with endocrinologist and nephrologist Dr. Melanee Spry.   Follow Up Instructions:     I discussed the assessment and treatment plan with the patient. The patient was provided an opportunity to ask questions and all were answered. The patient agreed with the plan and demonstrated an understanding of the instructions.   The patient was advised to call back or seek an in-person evaluation if the symptoms worsen or if the condition fails to improve as anticipated.    Collaboration of Care: Other provider involved in patient's care AEB notes are available in epic to review  Patient/Guardian was advised Release of Information must be obtained prior to any record release in order to collaborate their care with an outside provider.  Patient/Guardian was advised if they have not already done so to contact the registration department to sign all necessary forms in order for [place] to release information regarding their care.   Consent: Patient/Guardian gives verbal consent for treatment and assignment of benefits for services provided during this visit. Patient/Guardian expressed understanding and agreed to proceed.     I provided 18 minutes of non face to face time during this encounter.  Note: This document was prepared by [employer] voice dictation technology and any errors that results from this process are unintentional.    Cleotis Nipper, MD 10/04/2023

## 2023-10-12 ENCOUNTER — Other Ambulatory Visit: Payer: Self-pay | Admitting: Internal Medicine

## 2023-10-12 MED ORDER — ACCU-CHEK GUIDE TEST VI STRP: ORAL_STRIP | 3 refills | Status: AC

## 2023-10-12 NOTE — Telephone Encounter (Signed)
 Copied from CRM [id number]. Topic: Clinical - Medication Refill >> Oct 12, 2023  8:41 AM Isabell A wrote: Most Recent Primary Care Visit:  Provider: Moshe Cipro  Department: [facility]  Visit Type: OFFICE VISIT  Date: 09/23/2023  Medication: ACCU-CHEK GUIDE test strip  Has the patient contacted their pharmacy? Yes (Agent: If no, request that the patient contact the pharmacy for the refill. If patient does not wish to contact the pharmacy document the reason why and proceed with request.) (Agent: If yes, when and what did the pharmacy advise?)  Is this the correct pharmacy for this prescription? Yes If no, delete pharmacy and type the correct one.  This is the patient's preferred pharmacy:  Located in: [employer] Address: [address] [place], [place], [place] [address]  Has the prescription been filled recently? Yes  Is the patient out of the medication? Yes  Has the patient been seen for an appointment in the last year OR does the patient have an upcoming appointment? Yes  Can we respond through MyChart? No  Agent: Please be advised that Rx refills may take up to 3 business days. We ask that you follow-up with your pharmacy.

## 2023-10-14 ENCOUNTER — Ambulatory Visit (HOSPITAL_BASED_OUTPATIENT_CLINIC_OR_DEPARTMENT_OTHER): Payer: 59 | Admitting: Cardiology

## 2023-10-18 ENCOUNTER — Ambulatory Visit (INDEPENDENT_AMBULATORY_CARE_PROVIDER_SITE_OTHER): Payer: 59 | Admitting: Internal Medicine

## 2023-10-18 ENCOUNTER — Encounter: Payer: Self-pay | Admitting: Internal Medicine

## 2023-10-18 VITALS — BP 120/70 | HR 67 | Ht 62.0 in | Wt 145.0 lb

## 2023-10-18 DIAGNOSIS — E042 Nontoxic multinodular goiter: Secondary | ICD-10-CM

## 2023-10-18 DIAGNOSIS — E039 Hypothyroidism, unspecified: Secondary | ICD-10-CM | POA: Diagnosis not present

## 2023-10-18 DIAGNOSIS — E119 Type 2 diabetes mellitus without complications: Secondary | ICD-10-CM

## 2023-10-18 DIAGNOSIS — Z794 Long term (current) use of insulin: Secondary | ICD-10-CM | POA: Diagnosis not present

## 2023-10-18 DIAGNOSIS — E118 Type 2 diabetes mellitus with unspecified complications: Secondary | ICD-10-CM | POA: Diagnosis not present

## 2023-10-18 MED ORDER — TOUJEO SOLOSTAR 300 UNIT/ML ~~LOC~~ SOPN: 32.0000 [IU] | PEN_INJECTOR | Freq: Every day | SUBCUTANEOUS | 3 refills | Status: DC

## 2023-10-18 MED ORDER — OZEMPIC (1 MG/DOSE) 4 MG/3ML ~~LOC~~ SOPN: 1.0000 mg | PEN_INJECTOR | SUBCUTANEOUS | 3 refills | Status: DC

## 2023-10-18 MED ORDER — HUMALOG KWIKPEN 200 UNIT/ML ~~LOC~~ SOPN: 8.0000 [IU] | PEN_INJECTOR | Freq: Two times a day (BID) | SUBCUTANEOUS | 3 refills | Status: DC

## 2023-10-18 MED ORDER — LEVOTHYROXINE SODIUM 88 MCG PO TABS: 88.0000 ug | ORAL_TABLET | Freq: Every day | ORAL | 1 refills | Status: DC

## 2023-10-18 NOTE — Progress Notes (Signed)
 MEN patient ID: Sonia Skinner, female   DOB: Jan 03, 1951, 73 y.o.   MRN: [id number]  HPI: Sonia Skinner is a 72 y.o.-year-old female, returning for follow-up for DM2, dx in 1962, insulin-dependent since 2000, uncontrolled, with complications (CVA, left carotid stenosis, PAD, CKD stage IV, PN, history of hypoglycemia, history of nonketotic hyperglycemic state). Pt. previously saw Dr. Everardo All, but last visit with me 1.5 months ago.  Interim history: No increased urination, blurry vision, nausea, chest pain.  She gained 16 lbs before last OV.  She was eating a lot of popsicles, which I advised her to stop (she did).  She was also taking Ozempic every other week as she could not tolerate it every week due to significantly decreased appetite.  I advised her to reduce the dose and take it every week. Now back on 0.5 mg weekly but she did not notice a decrease in appetite.  Reviewed HbA1c: Lab Results  Component Value Date   HGBA1C 11.0 (A) 09/06/2023   HGBA1C 9.2 (H) 05/03/2023   HGBA1C 8.1 (A) 12/31/2022   HGBA1C 8.9 (H) 03/29/2022   HGBA1C 8.1 (H) 02/26/2022   HGBA1C 7.8 (H) 11/14/2021   HGBA1C 8.0 (A) 11/02/2021   HGBA1C 8.5 (H) 08/15/2021   HGBA1C 7.2 (H) 04/07/2021   HGBA1C 10.2 (H) 04/01/2020   She was on a regimen of: - Farxiga 10 mg before breakfast - started 08/2021 >> stopped by her nephrologist, Dr. Melanee Spry - Ozempic 2 mg weekly  >> stopped 01/2023 due to significantly decreased appetite "I could not eat anything" - Humalog 3-5 units 15 min before a meal with more carbs - Toujeo 30 >> 20-22 >> 26 units daily She was previously on Basaglar, then NPH.  Now on: - Toujeo 26 >> 30 >> recommended 40 units daily >> but takes 28 units in am - Humalog 5-7 units 15 min before meals >> takes it AFTER the meals >> moved before meals: 7-10 units 15 min before meals >> still takes 5-8 units before meals  - Ozempic 0.5 >> 0.25 >> 0.58-10 mg weekly in a.m.   Pt checks her sugars more than 4 times a  day with her Dexcom CGM:  Previously:    Previously:   Lowest sugar was 40 >> 68 >> 66 >> 63; she has hypoglycemia awareness at 70.  Highest sugar was 300 >> 350 >> 400 >> 401.  In 2017, she had nonketotic hyperglycemic state. She was admitted 01/2023 with AKI + hyperkalemia due to using both Ozempic and [place].  At that time it was also noted that she had nausea/vomiting/poor oral intake and presyncope while on Ozempic.  She was taken off both Ozempic and Farxiga at that time.  Glucometer: Accu-Chek guide  Meals: - coffee + sugar at 10 am - takes 6 units after coffee - lunch: fried chicken + yeast rolls, etc - dinner: meat + potatoes + collard greens  vanilla + chocolate.  - + CKD stage IV - sees Dr. Melanee Spry (nephrology), last BUN/creatinine:  Lab Results  Component Value Date   BUN 25 (A) 04/19/2023   BUN 21 01/27/2023   CREATININE 2.2 (A) 04/19/2023   CREATININE 2.15 (H) 01/27/2023   Lab Results  Component Value Date   MICRALBCREAT 2 04/20/2023   MICRALBCREAT 3 11/02/2022   MICRALBCREAT 53 11/02/2022   MICRALBCREAT 0.9 03/29/2022   MICRALBCREAT 1.0 02/25/2022   MICRALBCREAT 1.1 04/07/2021   MICRALBCREAT 7 04/01/2020   MICRALBCREAT 1.3 02/20/2018   MICRALBCREAT 2.0 12/20/2016  Not on ACE inhibitor/ARB.  -+ HL; last set of lipids: Lab Results  Component Value Date   CHOL 115 05/03/2023   HDL 38.70 (L) 05/03/2023   LDLCALC 54 05/03/2023   LDLDIRECT 141 (H) 03/04/2010   TRIG 113.0 05/03/2023   CHOLHDL 3 05/03/2023  On Lipitor 40 mg daily. On Zetia 10 mg daily.  - last eye exam was 12/2022. No DR reportedly.   - no numbness and tingling in her feet.  Foot exam 05/03/2023.  She is on gabapentin 100 mg 3 times a day.  She has a history of multinodular goiter, diagnosed in 2017.  Latest thyroid ultrasound (11/09/2021) reviewed: Parenchymal Echotexture: Moderately heterogenous  Isthmus: Normal in size measuring 0.3 cm in diameter, unchanged  Right lobe: Normal  in size measuring 5.0 x 1.8 x 1.0 cm, previously, 4.9 x 1.7 x 1.4 cm  Left lobe: Normal in size measuring 4.8 x 1.6 x 1.6 cm, previously, 5.3 x 2.0 x 1.9 cm _________________________________________________________   The approximately 1.2 x 1.0 x 0.9 cm isoechoic ill-defined nodule within the mid aspect the right lobe of the thyroid (labeled 1), is grossly unchanged compared to the 05/2016 examination, previously, 1.2 x 1.1 x 0.9 cm and is again noted to contain an internal partially shadowing macrocalcification. Imaging stability for greater than 5 years is indicative of a benign etiology.   The approximately 1.0 x 1.0 x 0.5 cm spongiform/benign-appearing nodule within the inferior, medial aspect of the right lobe of the thyroid (labeled 2), is grossly unchanged compared to the 05/2016 examination, previously, 1.0 cm, and again does not meet criteria to recommend percutaneous sampling or continued dedicated follow-up.  _________________________________________________________   The approximately 2.5 x 1.5 x 1.3 cm isoechoic nodule within mid aspect the left lobe of the thyroid (labeled 3), is unchanged compared to the 05/2016 examination, previously, 2.5 x 1.8 x 1.3 cm and is again noted to contain an internal partially shadowing macrocalcification. Imaging stability for greater than 5 years is indicative of a benign etiology.   IMPRESSION: 1. Similar findings of multinodular goiter. No worrisome new or enlarging thyroid nodules. 2. All discretely thyroid nodules are unchanged since the 05/2016 examination. By report, dominant bilateral thyroid nodules may have been biopsied at an outside institution. If so, correlation with previous biopsy results is advised. Otherwise, imaging stability for greater than 5 years is indicative of a benign etiology and as such percutaneous sampling and/or continued dedicated follow-up is not recommended.  Hypothyroidism:  She is on Synthroid 88  mcg daily: - in am - fasting - right before b'fast >> moved 30 minutes before breakfast - no calcium - no iron - no multivitamins - + PPIs  - Dexilant in am 30 min to 1h >> moved later in the day - not on Biotin  Reviewed most recent TFTs: Lab Results  Component Value Date   TSH 1.09 05/03/2023   TSH 0.368 01/19/2023   TSH 4.23 03/29/2022   TSH 3.75 02/25/2022   TSH 1.088 11/14/2021   TSH 11.69 (H) 11/02/2021   TSH 0.25 (L) 08/27/2021   TSH 0.053 (L) 08/15/2021   TSH 33.15 (H) 04/07/2021   TSH 4.06 04/01/2020   She is not on steroids or biotin.  She also has a history of GERD, headache, HTN, depression. She was admitted 08/12/2022 with SBO.  ROS: + see HPI Past Medical History:  Diagnosis Date   Ankle fracture    Left   Anxiety    Carotid artery occlusion  Closed fracture of left distal fibula 09/16/2017   Complication of anesthesia    ALLERY TO ESTER BASE   Depression    early 30s   Diabetes mellitus    INSULIN DEPENDENT   GERD (gastroesophageal reflux disease)    Headache    years ago   Hypertension    Pneumonia    Stroke [facility]) March 2015    left MCA infarct, slight weakness on left side   Thyroid disease    Past Surgical History:  Procedure Laterality Date   ABDOMINAL HYSTERECTOMY     ANTERIOR FIXATION AND POSTERIOR MICRODISCECTOMY CERVICAL SPINE  1999   CAROTID ENDARTERECTOMY Left 11-04-13   cea   ENDARTERECTOMY Left 11/04/2013   IR ANGIO INTRA EXTRACRAN SEL COM CAROTID INNOMINATE BILAT MOD SED  11/16/2021   IR ANGIO INTRA EXTRACRAN SEL COM CAROTID INNOMINATE UNI L MOD SED  11/19/2021   IR ANGIO VERTEBRAL SEL VERTEBRAL BILAT MOD SED  11/16/2021   IR CT HEAD LTD  11/19/2021   IR INTRAVSC STENT CERV CAROTID W/EMB-PROT MOD SED INCL ANGIO  11/19/2021   IR RADIOLOGIST EVAL & MGMT  12/13/2021   ORIF ANKLE FRACTURE Left 09/16/2017   Procedure: OPEN REDUCTION INTERNAL FIXATION (ORIF) LEFT ANKLE FRACTURE;  Surgeon: Teryl Lucy, MD;  Location: [facility] OR;  Service:  Orthopedics;  Laterality: Left;   RADIOLOGY WITH ANESTHESIA N/A 11/19/2021   Procedure: Left carotid angioplasty with possible stenting;  Surgeon: Julieanne Cotton, MD;  [facility];  Service: Radiology;  Laterality: N/A;   Social History   Socioeconomic History   Marital status: Legally Separated    Spouse name: Not on file   Number of children: 0   Years of education: College   Highest education level: Not on file  Occupational History   Occupation: [employer]    Employer: [employer]  Tobacco Use   Smoking status: Former    Current packs/day: 0.00    Average packs/day: 1 pack/day for 20.0 years (20.0 ttl pk-yrs)    Types: Cigarettes    Start date: 11/01/1993    Quit date: 11/01/2013    Years since quitting: 9.9   Smokeless tobacco: Never  Vaping Use   Vaping status: Never Used  Substance and Sexual Activity   Alcohol use: Not Currently    Alcohol/week: 1.0 - 2.0 standard drink of alcohol    Types: 1 - 2 Standard drinks or equivalent per week   Drug use: No   Sexual activity: Not Currently  Other Topics Concern   Not on file  Social History Narrative   Patient lives in 1 story home with sister.   Caffeine Use: 2 cups daily; sodas occasionally   Some college education   Works as Lawyer for [employer]   Social Drivers of Health   Financial Resource Strain: Low Risk  (05/18/2023)   Overall Financial Resource Strain (CARDIA)    Difficulty of Paying Living Expenses: Not hard at all  Food Insecurity: No Food Insecurity (05/18/2023)   Hunger Vital Sign    Worried About Running Out of Food in the Last Year: Never true    Ran Out of Food in the Last Year: Never true  Transportation Needs: No Transportation Needs (05/18/2023)   PRAPARE - Administrator, Civil Service (Medical): No    Lack of Transportation (Non-Medical): No  Physical Activity: Insufficiently Active (05/18/2023)   Exercise Vital Sign    Days of Exercise per Week: 4 days    Minutes of Exercise  per Session: 30 min  Stress: No Stress Concern Present (05/18/2023)   [employer] of Occupational Health - Occupational Stress Questionnaire    Feeling of Stress : Only a little  Social Connections: Moderately Integrated (05/18/2023)   Social Connection and Isolation Panel [NHANES]    Frequency of Communication with Friends and Family: More than three times a week    Frequency of Social Gatherings with Friends and Family: More than three times a week    Attends Religious Services: More than 4 times per year    Active Member of [employer] or Organizations: Yes    Attends Banker Meetings: More than 4 times per year    Marital Status: Separated  Intimate Partner Violence: Not At Risk (05/18/2023)   Humiliation, Afraid, Rape, and Kick questionnaire    Fear of Current or Ex-Partner: No    Emotionally Abused: No    Physically Abused: No    Sexually Abused: No   Current Outpatient Medications on File Prior to Visit  Medication Sig Dispense Refill   Accu-Chek FastClix Lancets MISC Use to check blood sugar five times daily. (Patient not taking: Reported on 09/06/2023) 510 each 2   ACCU-CHEK GUIDE test strip Use to check blood sugar twice a day. E11.9 (Patient not taking: Reported on 09/06/2023) 100 each 2   amLODipine (NORVASC) 5 MG tablet Take 1 tablet (5 mg total) by mouth daily. 90 tablet 0   aspirin 81 MG chewable tablet Chew 1 tablet (81 mg total) by mouth daily. 30 tablet 1   atorvastatin (LIPITOR) 40 MG tablet Take 1 tablet (40 mg total) by mouth daily. 30 tablet 2   benztropine (COGENTIN) 0.5 MG tablet Take 1 tablet (0.5 mg total) by mouth at bedtime. 90 tablet 0   brexpiprazole (REXULTI) 1 MG TABS tablet Take 1 tablet (1 mg total) by mouth daily. 90 tablet 0   buPROPion (WELLBUTRIN XL) 300 MG 24 hr tablet Take 1 tablet (300 mg total) by mouth daily. 90 tablet 0   Continuous Blood Gluc Receiver (DEXCOM G7 RECEIVER) DEVI 1 Act by Does not apply route daily. 2 each 5   Continuous  Glucose Sensor (DEXCOM G7 SENSOR) MISC 1 Act by Does not apply route daily. 6 each 3   Dexlansoprazole 30 MG capsule DR Take 1 capsule (30 mg total) by mouth daily. 90 capsule 0   ezetimibe (ZETIA) 10 MG tablet Take 1 tablet by mouth once daily. 90 tablet 0   gabapentin (NEURONTIN) 100 MG capsule Take 1 capsule (100 mg total) by mouth 3 (three) times daily. 270 capsule 1   glucose blood (ACCU-CHEK GUIDE TEST) test strip 1 each by Other route as needed for other. Use as instructed     glucose blood (ACCU-CHEK GUIDE TEST) test strip Use to check blood sugar twice a day. E11.9 200 each 3   HYDROcodone-acetaminophen (NORCO) 5-325 MG tablet Take 1 tablet by mouth every 6 (six) hours as needed for moderate pain (pain score 4-6). 100 tablet 0   insulin glargine, 1 Unit Dial, (TOUJEO SOLOSTAR) 300 UNIT/ML Solostar Pen Inject 26 Units into the skin daily. 9 mL 3   insulin lispro (HUMALOG KWIKPEN) 200 UNIT/ML KwikPen Inject 5-8 Units into the skin 2 (two) times daily before a meal. (Patient taking differently: Inject 5-8 Units into the skin 2 (two) times daily before a meal. 8-10 units in the skin twice daily.) 12 mL 0   Insulin Pen Needle 32G X 4 MM MISC Use 3x a  day 300 each 3   levothyroxine (SYNTHROID) 88 MCG tablet Take 1 tablet by mouth daily. 90 tablet 1   lubiprostone (AMITIZA) 24 MCG capsule Take 1 capsule (24 mcg total) by mouth 2 (two) times daily with a meal. 180 capsule 1   Semaglutide,0.25 or 0.5MG /DOS, (OZEMPIC, 0.25 OR 0.5 MG/DOSE,) 2 MG/3ML SOPN Inject 0.25 mg into the skin once a week. 6 mL 3   traZODone (DESYREL) 50 MG tablet Take 1 tablet (50 mg total) by mouth at bedtime as needed for sleep. 90 tablet 0   No current facility-administered medications on file prior to visit.   Allergies  Allergen Reactions   Anesthetics, Ester Anaphylaxis   Family History  Problem Relation Age of Onset   Diabetes Mother    Heart disease Mother        Before age 37   Cancer Father        Lung    Hypertension Sister    Diabetes Sister    Diabetes Sister    Diabetes Sister    PE: BP 120/70   Pulse 67   Ht 5\' 2"  (1.575 m)   Wt 145 lb (65.8 kg)   SpO2 95%   BMI 26.52 kg/m  Wt Readings from Last 10 Encounters:  10/18/23 145 lb (65.8 kg)  09/23/23 147 lb (66.7 kg)  09/06/23 143 lb 3.2 oz (65 kg)  08/16/23 146 lb 3.2 oz (66.3 kg)  05/18/23 127 lb (57.6 kg)  05/04/23 127 lb 12.8 oz (58 kg)  05/03/23 127 lb (57.6 kg)  01/27/23 116 lb (52.6 kg)  01/18/23 123 lb 7.3 oz (56 kg)  12/31/22 123 lb 12.8 oz (56.2 kg)   Constitutional: normal weight, in NAD Eyes: EOMI, no exophthalmos ENT: no thyromegaly, no cervical lymphadenopathy Cardiovascular: RRR, No MRG Respiratory: CTA B Musculoskeletal: no deformities Skin: no rashes Neurological: very slight tremor with outstretched hands  ASSESSMENT: 1. DM2, insulin-dependent, uncontrolled, with complications - CVA - carotid stenosis - PAD - CKD stage 4 - PN - Hypoglycemia - Nonketotic hyperglycemic state in 2017  2.  Hypothyroidism  3.  Multinodular goiter  PLAN:  1. Patient with longstanding, uncontrolled, type 2 diabetes, on basal/bolus insulin regimen and weekly GLP-1 receptor agonist, with still very poor control.  At last visit, HbA1c was very high, 11%, increased from 9.2%.  At that time, sugars are fluctuating almost entirely above the target range, with significant hyperglycemic peaks after coffee in the morning (she was not eating breakfast), in which she added regular sugar.  I advised her to switch this to Stevia.  She was using insulin for coffee, but only after she was drinking it.  I advised her to move the insulin before coffee.  We discussed about taking Humalog 15 minutes before every meal.  We increased the dose of Humalog and Toujeo and move the injections from the center abdomen to the lateral abdomen and upper thighs.  Since she could not tolerate Ozempic 0.5 mg weekly, I advised her to take 0.25 mg  weekly. CGM interpretation:  -At today's visit, we reviewed her CGM downloads: It appears that 51% of values are in target range (goal >70%), while 49% are higher than 180 (goal <25%), and 0% are lower than 70 (goal <4%).  The calculated average blood sugar is 190.  The projected HbA1c for the next 3 months (GMI) is 7.8%. -Reviewing the CGM tracings, the sugars appear to have improved since last visit, but since she did not make the insulin  dose changes suggested at last visit, as she forgot, the improvement is most likely due to the fact that she stopped eating popsicles.  She is still getting regular sugar in her coffee, but not as much as before.  I advised her to stop this completely and switch to Stevia. -Will also increase the dose of Toujeo and, per her request, we will increase the Ozempic dose to 1 mg weekly as she tolerates well the 0.5 mg dose.  I would not advise her to increase the Humalog for now, although she is taking a lower dose than recommended at last visit, as we are increasing the Ozempic and any more improvement in diet. - I advised her to: Patient Instructions  Please increase: - Toujeo 32 units daily - Ozempic 1 mg weekly  Continue: - Humalog 5-8 units 15 min before meals  Switch from sugar to stevia.  Please continue levothyroxine 88 mcg daily.  Take the thyroid hormone every day, with water, at least 30 minutes before breakfast, separated by at least 4 hours from: - acid reflux medications - calcium - iron - multivitamins  Please return in 1.5 months.  - we will check her HbA1c at next visit - advised to check sugars at different times of the day - 4x a day, rotating check times - advised for yearly eye exams >> she is UTD - return to clinic in 1.5 months  2.  Hypothyroidism - latest thyroid labs reviewed with pt. >> normal: Lab Results  Component Value Date   TSH 1.09 05/03/2023  - she continues on LT4 88 mcg daily - pt feels good on this dose. - we  discussed about taking the thyroid hormone every day, with water, >30 minutes before breakfast, separated by >4 hours from acid reflux medications, calcium, iron, multivitamins. Pt. is taking it correctly.  3.  Multinodular goiter -She denies neck compression symptoms -Most recent thyroid ultrasound showed stability of her nodules over 6 years -No further imaging is needed for this, we will continue to follow her clinically  Carlus Pavlov, MD PhD [facility]

## 2023-10-18 NOTE — Patient Instructions (Addendum)
 Please increase: - Toujeo 32 units daily - Ozempic 1 mg weekly  Continue: - Humalog 5-8 units 15 min before meals  Switch from sugar to stevia.  Please continue levothyroxine 88 mcg daily.  Take the thyroid hormone every day, with water, at least 30 minutes before breakfast, separated by at least 4 hours from: - acid reflux medications - calcium - iron - multivitamins  Please return in 1.5 months.

## 2023-10-19 ENCOUNTER — Other Ambulatory Visit: Payer: Self-pay | Admitting: Internal Medicine

## 2023-10-19 NOTE — Telephone Encounter (Signed)
 Copied from CRM [id number]. Topic: Clinical - Medication Refill >> Oct 19, 2023  2:29 PM Alcus Dad wrote: Most Recent Primary Care Visit:  Provider: Moshe Cipro  Department: [facility]  Visit Type: OFFICE VISIT  Date: 09/23/2023  Medication: ACCU-CHEK GUIDE test strip  Has the patient contacted their pharmacy? Yes (Agent: If no, request that the patient contact the pharmacy for the refill. If patient does not wish to contact the pharmacy document the reason why and proceed with request.) (Agent: If yes, when and what did the pharmacy advise?)  Is this the correct pharmacy for this prescription? yes If no, delete pharmacy and type the correct one.  This is the patient's preferred pharmacy:  [facility] - [place], [place] - [address] [place] [address] Phone: [phone] Fax: [phone]   Has the prescription been filled recently? No  Is the patient out of the medication? No  Has the patient been seen for an appointment in the last year OR does the patient have an upcoming appointment? Yes  Can we respond through MyChart? Yes  Agent: Please be advised that Rx refills may take up to 3 business days. We ask that you follow-up with your pharmacy.

## 2023-10-31 ENCOUNTER — Other Ambulatory Visit: Payer: Self-pay | Admitting: Internal Medicine

## 2023-10-31 DIAGNOSIS — K2100 Gastro-esophageal reflux disease with esophagitis, without bleeding: Secondary | ICD-10-CM

## 2023-10-31 DIAGNOSIS — H905 Unspecified sensorineural hearing loss: Secondary | ICD-10-CM | POA: Diagnosis not present

## 2023-11-07 ENCOUNTER — Other Ambulatory Visit: Payer: Self-pay | Admitting: Internal Medicine

## 2023-11-07 DIAGNOSIS — E785 Hyperlipidemia, unspecified: Secondary | ICD-10-CM

## 2023-11-08 ENCOUNTER — Telehealth: Payer: Self-pay

## 2023-11-08 NOTE — Telephone Encounter (Signed)
 Patient was identified as falling into the [place] Measure - Diabetes.   Patient was: Appointment scheduled for lab or office visit for A1c.  Patient is on a 6 month schedule, she follows with endo

## 2023-11-17 ENCOUNTER — Telehealth: Payer: Self-pay

## 2023-11-17 DIAGNOSIS — E119 Type 2 diabetes mellitus without complications: Secondary | ICD-10-CM

## 2023-11-17 MED ORDER — DEXCOM G7 SENSOR MISC: 1.0000 | Freq: Every day | 3 refills | Status: DC

## 2023-11-17 NOTE — Telephone Encounter (Signed)
 Requested Prescriptions   Signed Prescriptions Disp Refills   Continuous Glucose Sensor (DEXCOM G7 SENSOR) MISC 6 each 3    Sig: 1 Act by Does not apply route daily.    Authorizing Provider: Carlus Pavlov    Ordering User: Pollie Meyer

## 2023-11-24 ENCOUNTER — Other Ambulatory Visit: Payer: Self-pay | Admitting: Internal Medicine

## 2023-11-24 DIAGNOSIS — E039 Hypothyroidism, unspecified: Secondary | ICD-10-CM

## 2023-11-30 ENCOUNTER — Other Ambulatory Visit: Payer: Self-pay | Admitting: Internal Medicine

## 2023-11-30 DIAGNOSIS — E039 Hypothyroidism, unspecified: Secondary | ICD-10-CM

## 2023-12-07 ENCOUNTER — Other Ambulatory Visit: Payer: Self-pay | Admitting: Internal Medicine

## 2023-12-07 DIAGNOSIS — E785 Hyperlipidemia, unspecified: Secondary | ICD-10-CM

## 2023-12-12 ENCOUNTER — Telehealth: Payer: Self-pay | Admitting: Internal Medicine

## 2023-12-12 NOTE — Telephone Encounter (Signed)
 Patient called to reschedule appt x2 today. Stating that this is due to a work assignment. Previously we were able to reschedule for a time that kept her in a time frame around her suggested 6 week f/u, but all suggested appts were declined by patient and we continued with next available that was in July. She is now scheduled for 03/01/2024.

## 2023-12-13 ENCOUNTER — Ambulatory Visit: Admitting: Internal Medicine

## 2023-12-14 ENCOUNTER — Ambulatory Visit: Admitting: Internal Medicine

## 2023-12-14 ENCOUNTER — Other Ambulatory Visit: Payer: Self-pay | Admitting: Family Medicine

## 2023-12-14 DIAGNOSIS — G8929 Other chronic pain: Secondary | ICD-10-CM

## 2023-12-14 DIAGNOSIS — Z515 Encounter for palliative care: Secondary | ICD-10-CM

## 2023-12-27 ENCOUNTER — Other Ambulatory Visit: Payer: Self-pay

## 2023-12-30 ENCOUNTER — Ambulatory Visit (HOSPITAL_BASED_OUTPATIENT_CLINIC_OR_DEPARTMENT_OTHER): Admitting: Family

## 2024-01-03 ENCOUNTER — Telehealth (HOSPITAL_COMMUNITY): Payer: 59 | Admitting: Psychiatry

## 2024-01-06 ENCOUNTER — Other Ambulatory Visit: Payer: Self-pay | Admitting: Internal Medicine

## 2024-01-06 DIAGNOSIS — E785 Hyperlipidemia, unspecified: Secondary | ICD-10-CM

## 2024-01-23 ENCOUNTER — Ambulatory Visit (HOSPITAL_BASED_OUTPATIENT_CLINIC_OR_DEPARTMENT_OTHER): Payer: 59 | Admitting: Family

## 2024-01-25 ENCOUNTER — Telehealth: Payer: Self-pay | Admitting: Internal Medicine

## 2024-01-25 DIAGNOSIS — G8929 Other chronic pain: Secondary | ICD-10-CM

## 2024-01-25 DIAGNOSIS — Z515 Encounter for palliative care: Secondary | ICD-10-CM

## 2024-01-25 NOTE — Telephone Encounter (Unsigned)
 Copied from CRM [id number]. Topic: Clinical - Medication Refill >> Jan 25, 2024 10:50 AM Baldo Levan wrote: Medication:  HYDROcodone -acetaminophen  HYDROcodone -acetaminophen  (NORCO) 5-325 MG tablet   Has the patient contacted their pharmacy? Yes- advised to contact the doctor (Agent: If no, request that the patient contact the pharmacy for the refill. If patient does not wish to contact the pharmacy document the reason why and proceed with request.) (Agent: If yes, when and what did the pharmacy advise?)  This is the patient's preferred pharmacy:    [facility] [address] [place], [place] - [address] [place] [address] Phone: [phone] Fax: [phone]  Is this the correct pharmacy for this prescription? Yes If no, delete pharmacy and type the correct one.   Has the prescription been filled recently? No  Is the patient out of the medication? Yes- Patient has one left  Has the patient been seen for an appointment in the last year OR does the patient have an upcoming appointment? Yes- Patient is scheduled for 6/12 at 2:40   Can we respond through MyChart? Yes  Agent: Please be advised that Rx refills may take up to 3 business days. We ask that you follow-up with your pharmacy.

## 2024-01-26 ENCOUNTER — Encounter: Payer: Self-pay | Admitting: Internal Medicine

## 2024-01-26 ENCOUNTER — Ambulatory Visit (INDEPENDENT_AMBULATORY_CARE_PROVIDER_SITE_OTHER): Admitting: Internal Medicine

## 2024-01-26 VITALS — BP 178/60 | HR 76 | Temp 98.9°F | Resp 16 | Ht 62.0 in | Wt 143.8 lb

## 2024-01-26 DIAGNOSIS — M5442 Lumbago with sciatica, left side: Secondary | ICD-10-CM

## 2024-01-26 DIAGNOSIS — I10 Essential (primary) hypertension: Secondary | ICD-10-CM

## 2024-01-26 DIAGNOSIS — E039 Hypothyroidism, unspecified: Secondary | ICD-10-CM | POA: Diagnosis not present

## 2024-01-26 DIAGNOSIS — E118 Type 2 diabetes mellitus with unspecified complications: Secondary | ICD-10-CM

## 2024-01-26 DIAGNOSIS — I5032 Chronic diastolic (congestive) heart failure: Secondary | ICD-10-CM

## 2024-01-26 DIAGNOSIS — Z515 Encounter for palliative care: Secondary | ICD-10-CM

## 2024-01-26 DIAGNOSIS — Z0000 Encounter for general adult medical examination without abnormal findings: Secondary | ICD-10-CM

## 2024-01-26 DIAGNOSIS — M5441 Lumbago with sciatica, right side: Secondary | ICD-10-CM

## 2024-01-26 DIAGNOSIS — N184 Chronic kidney disease, stage 4 (severe): Secondary | ICD-10-CM | POA: Diagnosis not present

## 2024-01-26 DIAGNOSIS — G8929 Other chronic pain: Secondary | ICD-10-CM | POA: Diagnosis not present

## 2024-01-26 DIAGNOSIS — E440 Moderate protein-calorie malnutrition: Secondary | ICD-10-CM

## 2024-01-26 DIAGNOSIS — E1142 Type 2 diabetes mellitus with diabetic polyneuropathy: Secondary | ICD-10-CM | POA: Diagnosis not present

## 2024-01-26 DIAGNOSIS — E785 Hyperlipidemia, unspecified: Secondary | ICD-10-CM

## 2024-01-26 DIAGNOSIS — Z720 Tobacco use: Secondary | ICD-10-CM

## 2024-01-26 DIAGNOSIS — Z0001 Encounter for general adult medical examination with abnormal findings: Secondary | ICD-10-CM

## 2024-01-26 DIAGNOSIS — Z794 Long term (current) use of insulin: Secondary | ICD-10-CM

## 2024-01-26 DIAGNOSIS — M8000XA Age-related osteoporosis with current pathological fracture, unspecified site, initial encounter for fracture: Secondary | ICD-10-CM

## 2024-01-26 DIAGNOSIS — Z1231 Encounter for screening mammogram for malignant neoplasm of breast: Secondary | ICD-10-CM

## 2024-01-26 DIAGNOSIS — Z23 Encounter for immunization: Secondary | ICD-10-CM | POA: Insufficient documentation

## 2024-01-26 DIAGNOSIS — E559 Vitamin D deficiency, unspecified: Secondary | ICD-10-CM

## 2024-01-26 LAB — CBC WITH DIFFERENTIAL/PLATELET
Basophils Absolute: 0.1 10*3/uL (ref 0.0–0.1)
Basophils Relative: 0.8 % (ref 0.0–3.0)
Eosinophils Absolute: 0.3 10*3/uL (ref 0.0–0.7)
Eosinophils Relative: 2.8 % (ref 0.0–5.0)
HCT: 36.3 % (ref 36.0–46.0)
Hemoglobin: 11.3 g/dL — ABNORMAL LOW (ref 12.0–15.0)
Lymphocytes Relative: 33.2 % (ref 12.0–46.0)
Lymphs Abs: 3.7 10*3/uL (ref 0.7–4.0)
MCHC: 31.2 g/dL (ref 30.0–36.0)
MCV: 84.9 fl (ref 78.0–100.0)
Monocytes Absolute: 0.9 10*3/uL (ref 0.1–1.0)
Monocytes Relative: 8.4 % (ref 3.0–12.0)
Neutro Abs: 6.2 10*3/uL (ref 1.4–7.7)
Neutrophils Relative %: 54.8 % (ref 43.0–77.0)
Platelets: 210 10*3/uL (ref 150.0–400.0)
RBC: 4.28 Mil/uL (ref 3.87–5.11)
RDW: 14.4 % (ref 11.5–15.5)
WBC: 11.3 10*3/uL — ABNORMAL HIGH (ref 4.0–10.5)

## 2024-01-26 LAB — LIPID PANEL
Cholesterol: 170 mg/dL (ref 0–200)
HDL: 51.5 mg/dL (ref >39.00)
LDL Cholesterol: 88 mg/dL (ref 0–99)
NonHDL: 118.65
Total CHOL/HDL Ratio: 3
Triglycerides: 153 mg/dL — ABNORMAL HIGH (ref 0.0–149.0)
VLDL: 30.6 mg/dL (ref 0.0–40.0)

## 2024-01-26 LAB — BASIC METABOLIC PANEL WITH GFR
BUN: 19 mg/dL (ref 6–23)
CO2: 26 meq/L (ref 19–32)
Calcium: 9.8 mg/dL (ref 8.4–10.5)
Chloride: 106 meq/L (ref 96–112)
Creatinine, Ser: 1.68 mg/dL — ABNORMAL HIGH (ref 0.40–1.20)
GFR: 29.78 mL/min — ABNORMAL LOW (ref >60.00)
Glucose, Bld: 154 mg/dL — ABNORMAL HIGH (ref 70–99)
Potassium: 4.2 meq/L (ref 3.5–5.1)
Sodium: 141 meq/L (ref 135–145)

## 2024-01-26 LAB — HEPATIC FUNCTION PANEL
ALT: 16 U/L (ref 0–35)
AST: 13 U/L (ref 0–37)
Albumin: 4.3 g/dL (ref 3.5–5.2)
Alkaline Phosphatase: 143 U/L — ABNORMAL HIGH (ref 39–117)
Bilirubin, Direct: 0 mg/dL (ref 0.0–0.3)
Total Bilirubin: 0.3 mg/dL (ref 0.2–1.2)
Total Protein: 7.1 g/dL (ref 6.0–8.3)

## 2024-01-26 LAB — HEMOGLOBIN A1C: Hgb A1c MFr Bld: 7.7 % — ABNORMAL HIGH (ref 4.6–6.5)

## 2024-01-26 LAB — PHOSPHORUS: Phosphorus: 3.5 mg/dL (ref 2.3–4.6)

## 2024-01-26 MED ORDER — SHINGRIX 50 MCG/0.5ML IM SUSR: 0.5000 mL | Freq: Once | INTRAMUSCULAR | 1 refills | Status: AC

## 2024-01-26 NOTE — Patient Instructions (Signed)

## 2024-01-26 NOTE — Progress Notes (Unsigned)
 Subjective:  Patient ID: Sonia Skinner, female    DOB: 1951-06-04  Age: 73 y.o. MRN: [id number]  CC: Annual Exam, Hypertension, Diabetes, and Hyperlipidemia   HPI HEYDI SWANGO presents for a CPX and f/up ----  Outpatient Medications Prior to Visit  Medication Sig Dispense Refill   Accu-Chek FastClix Lancets MISC Use to check blood sugar five times daily. 510 each 2   ACCU-CHEK GUIDE test strip Use to check blood sugar twice a day. E11.9 100 each 2   aspirin  81 MG chewable tablet Chew 1 tablet (81 mg total) by mouth daily. 30 tablet 1   brexpiprazole  (REXULTI ) 1 MG TABS tablet Take 1 tablet (1 mg total) by mouth daily. 90 tablet 0   buPROPion  (WELLBUTRIN  XL) 300 MG 24 hr tablet Take 1 tablet (300 mg total) by mouth daily. 90 tablet 0   Continuous Blood Gluc Receiver (DEXCOM G7 RECEIVER) DEVI 1 Act by Does not apply route daily. 2 each 5   Continuous Glucose Sensor (DEXCOM G7 SENSOR) MISC 1 Act by Does not apply route daily. 6 each 3   Dexlansoprazole  30 MG capsule DR Take 1 capsule by mouth once daily 90 capsule 0   ezetimibe  (ZETIA ) 10 MG tablet Take 1 tablet by mouth once daily. 90 tablet 0   gabapentin  (NEURONTIN ) 100 MG capsule Take 1 capsule (100 mg total) by mouth 3 (three) times daily. 270 capsule 1   glucose blood (ACCU-CHEK GUIDE TEST) test strip 1 each by Other route as needed for other. Use as instructed     glucose blood (ACCU-CHEK GUIDE TEST) test strip Use to check blood sugar twice a day. E11.9 200 each 3   insulin  glargine, 1 Unit Dial , (TOUJEO  SOLOSTAR) 300 UNIT/ML Solostar Pen Inject 32-34 Units into the skin daily. 12 mL 3   insulin  lispro (HUMALOG  KWIKPEN) 200 UNIT/ML KwikPen Inject 8-10 Units into the skin 2 (two) times daily before a meal. 12 mL 3   Insulin  Pen Needle 32G X 4 MM MISC Use 3x a day 300 each 3   levothyroxine  (SYNTHROID ) 88 MCG tablet Take 1 tablet by mouth once daily 60 tablet 1   lubiprostone  (AMITIZA ) 24 MCG capsule Take 1 capsule (24 mcg total) by  mouth 2 (two) times daily with a meal. (Patient taking differently: Take 24 mcg by mouth as needed for constipation.) 180 capsule 1   Semaglutide , 1 MG/DOSE, (OZEMPIC , 1 MG/DOSE,) 4 MG/3ML SOPN Inject 1 mg into the skin once a week. 9 mL 3   traZODone  (DESYREL ) 50 MG tablet Take 1 tablet (50 mg total) by mouth at bedtime as needed for sleep. 90 tablet 0   valsartan  (DIOVAN ) 80 MG tablet Take 80 mg by mouth daily.     amLODipine  (NORVASC ) 5 MG tablet Take 1 tablet (5 mg total) by mouth daily. 90 tablet 0   atorvastatin  (LIPITOR ) 40 MG tablet Take 1 tablet by mouth daily. 30 tablet 0   HYDROcodone -acetaminophen  (NORCO) 5-325 MG tablet Take 1 tablet by mouth every 6 (six) hours as needed for moderate pain (pain score 4-6). 100 tablet 0   benztropine  (COGENTIN ) 0.5 MG tablet Take 1 tablet (0.5 mg total) by mouth at bedtime. (Patient not taking: Reported on 01/26/2024) 90 tablet 0   No facility-administered medications prior to visit.    ROS Review of Systems  Objective:  BP (!) 178/60 (BP Location: Right Arm, Patient Position: Sitting, Cuff Size: Normal)   Pulse 76   Temp 98.9 F (37.2  C) (Oral)   Resp 16   Ht 5' 2 (1.575 m)   Wt 143 lb 12.8 oz (65.2 kg)   SpO2 98%   BMI 26.30 kg/m   BP Readings from Last 3 Encounters:  01/26/24 (!) 178/60  10/18/23 120/70  09/23/23 (!) 140/64    Wt Readings from Last 3 Encounters:  01/26/24 143 lb 12.8 oz (65.2 kg)  10/18/23 145 lb (65.8 kg)  09/23/23 147 lb (66.7 kg)    Physical Exam Vitals reviewed.  Constitutional:      Appearance: Normal appearance.  HENT:     Nose: Nose normal.     Mouth/Throat:     Mouth: Mucous membranes are moist.   Eyes:     General: No scleral icterus.    Conjunctiva/sclera: Conjunctivae normal.    Cardiovascular:     Rate and Rhythm: Normal rate and regular rhythm.     Heart sounds: Murmur heard.     Systolic murmur is present with a grade of 1/6.     No friction rub. No gallop.     Comments:  EKG---- NSR, 83 bpm LAE No LVH, Q waves, or ST/T wave changes  Unchanged  Pulmonary:     Effort: Pulmonary effort is normal.     Breath sounds: No stridor. No wheezing, rhonchi or rales.  Abdominal:     General: Abdomen is flat.     Palpations: There is no mass.     Tenderness: There is no abdominal tenderness. There is no guarding.     Hernia: No hernia is present.   Musculoskeletal:        General: Normal range of motion.     Cervical back: Neck supple.     Right lower leg: No edema.     Left lower leg: No edema.  Lymphadenopathy:     Cervical: No cervical adenopathy.   Skin:    General: Skin is warm and dry.   Neurological:     General: No focal deficit present.     Mental Status: She is alert.   Psychiatric:        Mood and Affect: Mood normal.        Behavior: Behavior normal.     Lab Results  Component Value Date   WBC 11.3 (H) 01/26/2024   HGB 11.3 (L) 01/26/2024   HCT 36.3 01/26/2024   PLT 210.0 01/26/2024   GLUCOSE 154 (H) 01/26/2024   CHOL 170 01/26/2024   TRIG 153.0 (H) 01/26/2024   HDL 51.50 01/26/2024   LDLDIRECT 141 (H) 03/04/2010   LDLCALC 88 01/26/2024   ALT 16 01/26/2024   AST 13 01/26/2024   NA 141 01/26/2024   K 4.2 01/26/2024   CL 106 01/26/2024   CREATININE 1.68 (H) 01/26/2024   BUN 19 01/26/2024   CO2 26 01/26/2024   TSH 3.67 01/26/2024   INR 1.1 11/25/2021   HGBA1C 7.7 (H) 01/26/2024   MICROALBUR <0.7 03/29/2022    DG Chest Port 1 View Result Date: 01/18/2023 CLINICAL DATA:  Weakness.  Patient fell and struck head. EXAM: PORTABLE CHEST 1 VIEW COMPARISON:  08/31/2022 FINDINGS: Normal heart size and pulmonary vascularity. No focal airspace disease or consolidation in the lungs. No blunting of costophrenic angles. No pneumothorax. Mediastinal contours appear intact. Degenerative changes in the spine. Calcification of the aorta. IMPRESSION: No active disease. Electronically Signed   By: Boyce Byes M.D.   On: 01/18/2023 19:41    CT HEAD WO CONTRAST Result Date: 01/18/2023 CLINICAL DATA:  Abrasion above the right eye after fall on Monday with possible loss of consciousness EXAM: CT HEAD WITHOUT CONTRAST TECHNIQUE: Contiguous axial images were obtained from the base of the skull through the vertex without intravenous contrast. RADIATION DOSE REDUCTION: This exam was performed according to the departmental dose-optimization program which includes automated exposure control, adjustment of the mA and/or kV according to patient size and/or use of iterative reconstruction technique. COMPARISON:  CT head 02/17/2022 FINDINGS: Brain: No intracranial hemorrhage, mass effect, or evidence of acute infarct. No hydrocephalus. No extra-axial fluid collection. Generalized cerebral atrophy. Ill-defined hypoattenuation within the cerebral white matter is nonspecific but consistent with chronic small vessel ischemic disease. Chronic left parietal infarct. Vascular: No hyperdense vessel. Intracranial arterial calcification. Skull: No fracture or focal lesion. Small contusion about the right forehead. Sinuses/Orbits: No acute finding. Frothy secretions left sphenoid sinus. Paranasal sinuses and mastoid air cells are otherwise well aerated. Other: None. IMPRESSION: 1. No acute intracranial abnormality. 2. Small contusion about the right forehead. No calvarial fracture. Electronically Signed   By: Rozell Cornet M.D.   On: 01/18/2023 18:51    Assessment & Plan:  Essential hypertension, benign -     EKG 12-Lead -     CBC with Differential/Platelet; Future -     Basic metabolic panel with GFR; Future -     amLODIPine  Besylate; Take 1 tablet (5 mg total) by mouth daily.  Dispense: 90 tablet; Refill: 0 -     Torsemide; Take 1 tablet (20 mg total) by mouth daily.  Dispense: 90 tablet; Refill: 0 -     Terazosin HCl; Take 1 capsule (1 mg total) by mouth at bedtime.  Dispense: 90 capsule; Refill: 0  Type 2 diabetes mellitus with diabetic polyneuropathy,  with long-term current use of insulin  (HCC) -     HM Diabetes Foot Exam -     Microalbumin / creatinine urine ratio; Future -     Urinalysis, Routine w reflex microscopic; Future -     Hemoglobin A1c; Future -     Basic metabolic panel with GFR; Future  Need for prophylactic vaccination and inoculation against varicella -     Shingrix ; Inject 0.5 mLs into the muscle once for 1 dose.  Dispense: 0.5 mL; Refill: 1  Chronic renal disease, stage 4, severely decreased glomerular filtration rate (GFR) between 15-29 mL/min/1.73 square meter (HCC) -     Microalbumin / creatinine urine ratio; Future -     Urinalysis, Routine w reflex microscopic; Future -     CBC with Differential/Platelet; Future -     Basic metabolic panel with GFR; Future -     Torsemide; Take 1 tablet (20 mg total) by mouth daily.  Dispense: 90 tablet; Refill: 0  Chronic diastolic CHF (congestive heart failure) (HCC) -     Torsemide; Take 1 tablet (20 mg total) by mouth daily.  Dispense: 90 tablet; Refill: 0  Malnutrition of moderate degree  Acquired hypothyroidism -     TSH; Future  Visit for screening mammogram  Age-related osteoporosis with current pathological fracture, initial encounter -     Phosphorus; Future -     VITAMIN D  25 Hydroxy (Vit-D Deficiency, Fractures); Future  Vitamin D  deficiency -     Phosphorus; Future -     VITAMIN D  25 Hydroxy (Vit-D Deficiency, Fractures); Future  Tobacco abuse -     Ambulatory Referral for Lung Cancer Scre  Hyperlipidemia with target LDL less than 70 -     Hepatic function  panel; Future -     TSH; Future -     Lipid panel; Future  Chronic midline low back pain with bilateral sciatica -     HYDROcodone -Acetaminophen ; Take 1 tablet by mouth every 6 (six) hours as needed for moderate pain (pain score 4-6).  Dispense: 100 tablet; Refill: 0  Encounter for palliative care involving management of pain -     HYDROcodone -Acetaminophen ; Take 1 tablet by mouth every 6 (six)  hours as needed for moderate pain (pain score 4-6).  Dispense: 100 tablet; Refill: 0     Follow-up: Return in about 3 months (around 04/27/2024).  Sandra Crouch, MD

## 2024-01-27 ENCOUNTER — Other Ambulatory Visit: Payer: Self-pay

## 2024-01-27 DIAGNOSIS — G8929 Other chronic pain: Secondary | ICD-10-CM

## 2024-01-27 DIAGNOSIS — Z515 Encounter for palliative care: Secondary | ICD-10-CM

## 2024-01-27 LAB — TSH: TSH: 3.67 u[IU]/mL (ref 0.35–5.50)

## 2024-01-27 LAB — VITAMIN D 25 HYDROXY (VIT D DEFICIENCY, FRACTURES): VITD: 61.7 ng/mL (ref 30.00–100.00)

## 2024-01-27 MED ORDER — AMLODIPINE BESYLATE 5 MG PO TABS: 5.0000 mg | ORAL_TABLET | Freq: Every day | ORAL | 0 refills | Status: AC

## 2024-01-27 MED ORDER — HYDROCODONE-ACETAMINOPHEN 5-325 MG PO TABS: 1.0000 | ORAL_TABLET | Freq: Four times a day (QID) | ORAL | 0 refills | Status: DC | PRN

## 2024-01-27 MED ORDER — TERAZOSIN HCL 1 MG PO CAPS: 1.0000 mg | ORAL_CAPSULE | Freq: Every day | ORAL | 0 refills | Status: DC

## 2024-01-27 MED ORDER — TORSEMIDE 20 MG PO TABS: 20.0000 mg | ORAL_TABLET | Freq: Every day | ORAL | 0 refills | Status: DC

## 2024-01-27 NOTE — Telephone Encounter (Signed)
 Patient has been made aware.

## 2024-01-27 NOTE — Telephone Encounter (Signed)
 Copied from CRM [id number]. Topic: Clinical - Prescription Issue >> Jan 27, 2024 10:29 AM Freya Jesus wrote: Reason for CRM: Patient stated she was advised she needed an appointment to receive her medication and she had an appointment yesterday but her medication is not at her [facility] pharmacy: HYDROcodone -acetaminophen  (NORCO) 5-325 MG tablet [[id number]].

## 2024-01-27 NOTE — Telephone Encounter (Signed)
 Medication refill request has been sent to Dr. Yetta Barre

## 2024-01-29 DIAGNOSIS — Z0001 Encounter for general adult medical examination with abnormal findings: Secondary | ICD-10-CM | POA: Insufficient documentation

## 2024-01-30 ENCOUNTER — Ambulatory Visit: Payer: Self-pay | Admitting: Internal Medicine

## 2024-01-30 NOTE — Telephone Encounter (Signed)
 Copied from CRM [id number]. Topic: Clinical - Medication Question >> Jan 30, 2024 11:15 AM Dewanda Foots wrote: Reason for CRM: Pt states she called the pharmacy and they still do not have the prescription for this medication but it looks like it was sent over on 01/27/24.  HYDROcodone -acetaminophen  [facility]) 5-325 MG tablet  [facility] [address], [place] - [address] [place] [address]GEXBM: [phone] Fax: [phone]: Not open 24 hours   Please call patient to verify this was sent over   Patient callback [phone]

## 2024-01-31 ENCOUNTER — Telehealth (HOSPITAL_COMMUNITY): Admitting: Psychiatry

## 2024-01-31 ENCOUNTER — Other Ambulatory Visit (HOSPITAL_COMMUNITY): Payer: Self-pay

## 2024-01-31 ENCOUNTER — Other Ambulatory Visit: Payer: Self-pay | Admitting: Internal Medicine

## 2024-01-31 ENCOUNTER — Telehealth: Payer: Self-pay

## 2024-01-31 DIAGNOSIS — I10 Essential (primary) hypertension: Secondary | ICD-10-CM

## 2024-01-31 DIAGNOSIS — Z794 Long term (current) use of insulin: Secondary | ICD-10-CM

## 2024-01-31 NOTE — Telephone Encounter (Signed)
 Pharmacy Patient Advocate Encounter   Received notification from CoverMyMeds that prior authorization for Norco 5 is required/requested.   Insurance verification completed.   The patient is insured through [facility] .   Per test claim: PA required; PA submitted to above mentioned insurance via CoverMyMeds Key/confirmation #/EOC [id number] Status is pending

## 2024-01-31 NOTE — Telephone Encounter (Signed)
 Pharmacy Patient Advocate Encounter  Received notification from [employer] that Prior Authorization for Norco 5 has been APPROVED from 01/31/24 to 03/01/24. Unable to obtain price due to refill too soon rejection, last fill date 01/31/24 next available fill date7/8/25   PA #/Case ID/Reference #: [id number]

## 2024-02-01 NOTE — Telephone Encounter (Signed)
 Spoke with patient, pharmacy filled medication yesterday

## 2024-02-11 ENCOUNTER — Encounter (HOSPITAL_COMMUNITY): Payer: Self-pay | Admitting: Interventional Radiology

## 2024-02-13 ENCOUNTER — Ambulatory Visit (HOSPITAL_BASED_OUTPATIENT_CLINIC_OR_DEPARTMENT_OTHER): Admitting: Family

## 2024-02-13 ENCOUNTER — Encounter (HOSPITAL_BASED_OUTPATIENT_CLINIC_OR_DEPARTMENT_OTHER): Payer: Self-pay | Admitting: Family

## 2024-02-13 ENCOUNTER — Ambulatory Visit (INDEPENDENT_AMBULATORY_CARE_PROVIDER_SITE_OTHER): Admitting: Family

## 2024-02-13 VITALS — BP 160/60 | HR 65 | Ht 62.0 in | Wt 146.8 lb

## 2024-02-13 DIAGNOSIS — R0683 Snoring: Secondary | ICD-10-CM

## 2024-02-13 DIAGNOSIS — I10 Essential (primary) hypertension: Secondary | ICD-10-CM

## 2024-02-13 DIAGNOSIS — I6523 Occlusion and stenosis of bilateral carotid arteries: Secondary | ICD-10-CM

## 2024-02-13 MED ORDER — TERAZOSIN HCL 2 MG PO CAPS: 2.0000 mg | ORAL_CAPSULE | Freq: Every day | ORAL | 3 refills | Status: AC

## 2024-02-13 NOTE — Patient Instructions (Signed)
 Medication Instructions:  Your physician has recommended you make the following change in your medication:   Increase Terazosin  2mg  daily at bedtime   Testing/Procedures: Your physician has requested that you have a carotid duplex. This test is an ultrasound of the carotid arteries in your neck. It looks at blood flow through these arteries that supply the brain with blood. Allow one hour for this exam. There are no restrictions or special instructions.  Your provider has recommended a Home Sleep Study- the team will reach out to get this set up!   Follow-Up: At [facility], you and your health needs are our priority.  As part of our continuing mission to provide you with exceptional heart care, our providers are all part of one team.  This team includes your primary Cardiologist (physician) and Advanced Practice Providers or APPs (Physician Assistants and Nurse Practitioners) who all work together to provide you with the care you need, when you need it.  Your next appointment:   2 months with Jackee Alberts, NP   Other Instructions We will send you a message in two weeks to check on BP.

## 2024-02-13 NOTE — Progress Notes (Unsigned)
  Cardiology Office Note   Date:  02/13/2024  ID:  Sonia Skinner, DOB 10/24/1950, MRN [id number] PCP: Joshua Debby CROME, MD  [facility] Providers Cardiologist:  None { Click to update primary MD,subspecialty MD or APP then REFRESH:1}   Nephrology: Dr. Macel  History of Present Illness Sonia Skinner is a 73 y.o. female ***  Works part time as a Lawyer.   Chief complaint of fatigue. Reports waking up feeling tired. Reports sleeping well usually 6-7 hours. Gets up 2-3 times per night feeling hungry which she attributes to Ozempic  as dose was recent reduced from 1mg  to 0.5mg  as she had lost too much weight.  She has never had prior sleep study.   Some swelling in her feet worse by end of day.   ROS: Please see the history of present illness.    All other systems reviewed and are negative.   Studies Reviewed      *** Risk Assessment/Calculations   The patient's 1st BP is elevated (>139/89)*** Repeat BP and {Click to enter a 2nd BP Refresh Note  :1}       Physical Exam VS:  BP (!) 160/60   Pulse 65   Ht 5' 2 (1.575 m)   Wt 146 lb 12.8 oz (66.6 kg)   SpO2 97%   BMI 26.85 kg/m        Wt Readings from Last 3 Encounters:  02/13/24 146 lb 12.8 oz (66.6 kg)  01/26/24 143 lb 12.8 oz (65.2 kg)  10/18/23 145 lb (65.8 kg)    GEN: Well nourished, well developed in no acute distress NECK: No JVD; No carotid bruits CARDIAC: RRR, no murmurs, rubs, gallops RESPIRATORY:  Clear to auscultation without rales, wheezing or rhonchi  ABDOMEN: Soft, non-tender, non-distended EXTREMITIES:  No edema; No deformity   ASSESSMENT AND PLAN *** Hx of TIA /   HLD, LDL goal <70 -   Carotid stenosis - *** s/p CEA 2015  HTN -        Dispo: follow up in *** months   Signed, Reche GORMAN Finder, NP

## 2024-02-16 ENCOUNTER — Ambulatory Visit (INDEPENDENT_AMBULATORY_CARE_PROVIDER_SITE_OTHER): Admitting: Internal Medicine

## 2024-02-16 ENCOUNTER — Encounter: Payer: Self-pay | Admitting: Internal Medicine

## 2024-02-16 VITALS — BP 130/64 | HR 100 | Ht 62.0 in | Wt 142.2 lb

## 2024-02-16 DIAGNOSIS — Z794 Long term (current) use of insulin: Secondary | ICD-10-CM | POA: Diagnosis not present

## 2024-02-16 DIAGNOSIS — E1165 Type 2 diabetes mellitus with hyperglycemia: Secondary | ICD-10-CM | POA: Diagnosis not present

## 2024-02-16 DIAGNOSIS — E039 Hypothyroidism, unspecified: Secondary | ICD-10-CM | POA: Diagnosis not present

## 2024-02-16 DIAGNOSIS — E118 Type 2 diabetes mellitus with unspecified complications: Secondary | ICD-10-CM | POA: Diagnosis not present

## 2024-02-16 DIAGNOSIS — E1159 Type 2 diabetes mellitus with other circulatory complications: Secondary | ICD-10-CM

## 2024-02-16 DIAGNOSIS — E042 Nontoxic multinodular goiter: Secondary | ICD-10-CM

## 2024-02-16 MED ORDER — TOUJEO SOLOSTAR 300 UNIT/ML ~~LOC~~ SOPN
28.0000 [IU] | PEN_INJECTOR | Freq: Every day | SUBCUTANEOUS | Status: DC
Start: 2024-02-16 — End: 2024-06-21

## 2024-02-16 NOTE — Patient Instructions (Addendum)
 Please continue: - Ozempic  0.5 mg weekly  Please decrease: - Toujeo  28 units daily  Please increase Humalog  15 min before meals: - 12-15 units before B'fast - 8-10 units before lunch - 6-8 units before dinner  Please continue levothyroxine  88 mcg daily.  Take the thyroid  hormone every day, with water, at least 30 minutes before breakfast, separated by at least 4 hours from: - acid reflux medications - calcium  - iron - multivitamins  Please return in 3-4 months.

## 2024-02-16 NOTE — Progress Notes (Signed)
 MEN patient ID: Sonia Skinner, female   DOB: Jun 24, 1951, 73 y.o.   MRN: [id number]  HPI: Sonia Skinner is a 73 y.o.-year-old female, returning for follow-up for DM2, dx in 1962, insulin -dependent since 2000, uncontrolled, with complications (CVA, left carotid stenosis, PAD, CKD stage IV, PN, history of hypoglycemia, history of nonketotic hyperglycemic state). Pt. previously saw Dr. Kassie, but last visit with me 75 months ago.  Interim history: No increased urination, + blurry vision, no nausea, chest pain.  She also has fatigue.  Reviewed HbA1c: Lab Results  Component Value Date   HGBA1C 7.7 (H) 01/26/2024   HGBA1C 11.0 (A) 09/06/2023   HGBA1C 9.2 (H) 05/03/2023   HGBA1C 8.1 (A) 12/31/2022   HGBA1C 8.9 (H) 03/29/2022   HGBA1C 8.1 (H) 02/26/2022   HGBA1C 7.8 (H) 11/14/2021   HGBA1C 8.0 (A) 11/02/2021   HGBA1C 8.5 (H) 08/15/2021   HGBA1C 7.2 (H) 04/07/2021   She was on a regimen of: - Farxiga  10 mg before breakfast - started 08/2021 >> stopped by her nephrologist, Dr. Dalene - Ozempic  2 mg weekly  >> stopped 01/2023 due to significantly decreased appetite I could not eat anything - Humalog  3-5 units 15 min before a meal with more carbs - Toujeo  30 >> 20-22 >> 26 units daily She was previously on Basaglar , then NPH.  At last visit she was taking: - Toujeo  26 >> 30 >> recommended 40 units daily >> but takes 28 units in am - Humalog  5-7 units 15 min before meals >> takes it AFTER the meals >> moved before meals: 7-10 units 15 min before meals >> still takes 5-8 units before meals  - Ozempic  0.5 >> 0.25 >> 0.58-10 mg weekly in a.m.  I advised her to use the following regimen: - Ozempic  1 >> 0.5 mg weekly (too much weight loss and decreased appetite of the higher dose) - Toujeo  32 units daily in am - Humalog  5-8 >> 8-10 units 15 min before meals  Pt checks her sugars more than 4 times a day with her Dexcom CGM:  Previously:  Previously:    Lowest sugar was 40 >> 68 >> 66 >> 63  >> 40; she has hypoglycemia awareness at 70.  Highest sugar was 300 >> 350 >> 400 >> 401 >> 400.  In 2017, she had nonketotic hyperglycemic state. She was admitted 01/2023 with AKI + hyperkalemia due to using both Ozempic  and Farxiga .  At that time it was also noted that she had nausea/vomiting/poor oral intake and presyncope while on Ozempic .  She was taken off both Ozempic  and Farxiga  at that time.  Glucometer: Accu-Chek guide  Meals: - coffee + sugar at 10 am - takes 6 units after coffee - lunch: fried chicken + yeast rolls, etc - dinner: meat + potatoes + collard greens  vanilla + chocolate.  - + CKD stage IV - sees Dr. Dalene (nephrology), last BUN/creatinine:  Lab Results  Component Value Date   BUN 19 01/26/2024   BUN 25 (A) 04/19/2023   CREATININE 1.68 (H) 01/26/2024   CREATININE 2.2 (A) 04/19/2023   Lab Results  Component Value Date   MICRALBCREAT 2 04/20/2023   MICRALBCREAT 3 11/02/2022   MICRALBCREAT 53 11/02/2022   MICRALBCREAT 7 04/01/2020  Not on ACE inhibitor/ARB.  -+ HL; last set of lipids: Lab Results  Component Value Date   CHOL 170 01/26/2024   HDL 51.50 01/26/2024   LDLCALC 88 01/26/2024   LDLDIRECT 141 (H) 03/04/2010   TRIG  153.0 (H) 01/26/2024   CHOLHDL 3 01/26/2024  On Lipitor  40 mg daily. On Zetia  10 mg daily.  - last eye exam was 04/2023. No DR.   - no numbness and tingling in her feet.  Foot exam 01/26/2024.  She is on gabapentin  100 mg 3 times a day.  She has a history of multinodular goiter, diagnosed in 2017.  Latest thyroid  ultrasound (11/09/2021) reviewed: Parenchymal Echotexture: Moderately heterogenous  Isthmus: Normal in size measuring 0.3 cm in diameter, unchanged  Right lobe: Normal in size measuring 5.0 x 1.8 x 1.0 cm, previously, 4.9 x 1.7 x 1.4 cm  Left lobe: Normal in size measuring 4.8 x 1.6 x 1.6 cm, previously, 5.3 x 2.0 x 1.9 cm _________________________________________________________   The approximately 1.2 x 1.0 x 0.9  cm isoechoic ill-defined nodule within the mid aspect the right lobe of the thyroid  (labeled 1), is grossly unchanged compared to the 05/2016 examination, previously, 1.2 x 1.1 x 0.9 cm and is again noted to contain an internal partially shadowing macrocalcification. Imaging stability for greater than 5 years is indicative of a benign etiology.   The approximately 1.0 x 1.0 x 0.5 cm spongiform/benign-appearing nodule within the inferior, medial aspect of the right lobe of the thyroid  (labeled 2), is grossly unchanged compared to the 05/2016 examination, previously, 1.0 cm, and again does not meet criteria to recommend percutaneous sampling or continued dedicated follow-up.  _________________________________________________________   The approximately 2.5 x 1.5 x 1.3 cm isoechoic nodule within mid aspect the left lobe of the thyroid  (labeled 3), is unchanged compared to the 05/2016 examination, previously, 2.5 x 1.8 x 1.3 cm and is again noted to contain an internal partially shadowing macrocalcification. Imaging stability for greater than 5 years is indicative of a benign etiology.   IMPRESSION: 1. Similar findings of multinodular goiter. No worrisome new or enlarging thyroid  nodules. 2. All discretely thyroid  nodules are unchanged since the 05/2016 examination. By report, dominant bilateral thyroid  nodules may have been biopsied at an outside institution. If so, correlation with previous biopsy results is advised. Otherwise, imaging stability for greater than 5 years is indicative of a benign etiology and as such percutaneous sampling and/or continued dedicated follow-up is not recommended.  Hypothyroidism:  She is on Synthroid  88 mcg daily: - in am - fasting - right before b'fast >> moved 30 minutes before breakfast - no calcium  - no iron - no multivitamins - + PPIs  - Dexilant  in am 30 min to 1h >> moved later in the day - not on Biotin  Reviewed most recent TFTs: Lab  Results  Component Value Date   TSH 3.67 01/26/2024   TSH 1.09 05/03/2023   TSH 0.368 01/19/2023   TSH 4.23 03/29/2022   TSH 3.75 02/25/2022   TSH 1.088 11/14/2021   TSH 11.69 (H) 11/02/2021   TSH 0.25 (L) 08/27/2021   TSH 0.053 (L) 08/15/2021   TSH 33.15 (H) 04/07/2021   She is not on steroids or biotin.  She also has a history of GERD, headache, HTN, depression. She was admitted 08/12/2022 with SBO.  ROS: + see HPI Past Medical History:  Diagnosis Date   Ankle fracture    Left   Anxiety    Carotid artery occlusion    Closed fracture of left distal fibula 09/16/2017   Complication of anesthesia    ALLERY TO ESTER BASE   Depression    early 30s   Diabetes mellitus    INSULIN  DEPENDENT   GERD (gastroesophageal  reflux disease)    Headache    years ago   Hypertension    Pneumonia    Stroke [facility]) March 2015    left MCA infarct, slight weakness on left side   Thyroid  disease    Past Surgical History:  Procedure Laterality Date   ABDOMINAL HYSTERECTOMY     ANTERIOR FIXATION AND POSTERIOR MICRODISCECTOMY CERVICAL SPINE  1999   CAROTID ENDARTERECTOMY Left 11-04-13   cea   ENDARTERECTOMY Left 11/04/2013   IR ANGIO INTRA EXTRACRAN SEL COM CAROTID INNOMINATE BILAT MOD SED  11/16/2021   IR ANGIO INTRA EXTRACRAN SEL COM CAROTID INNOMINATE UNI L MOD SED  11/19/2021   IR ANGIO VERTEBRAL SEL VERTEBRAL BILAT MOD SED  11/16/2021   IR CT HEAD LTD  11/19/2021   IR INTRAVSC STENT CERV CAROTID W/EMB-PROT MOD SED INCL ANGIO  11/19/2021   IR RADIOLOGIST EVAL & MGMT  12/13/2021   ORIF ANKLE FRACTURE Left 09/16/2017   Procedure: OPEN REDUCTION INTERNAL FIXATION (ORIF) LEFT ANKLE FRACTURE;  Surgeon: Josefina Chew, MD;  Location: [facility] OR;  Service: Orthopedics;  Laterality: Left;   RADIOLOGY WITH ANESTHESIA N/A 11/19/2021   Procedure: Left carotid angioplasty with possible stenting;  Surgeon: Dolphus Carrion, MD;  Location: [facility] OR;  Service: Radiology;  Laterality: N/A;   Social History    Socioeconomic History   Marital status: Legally Separated    Spouse name: Not on file   Number of children: 0   Years of education: College   Highest education level: Not on file  Occupational History   Occupation: [employer]    Employer: [employer]  Tobacco Use   Smoking status: Former    Current packs/day: 0.00    Average packs/day: 1 pack/day for 20.0 years (20.0 ttl pk-yrs)    Types: Cigarettes    Start date: 11/01/1993    Quit date: 11/01/2013    Years since quitting: 10.2   Smokeless tobacco: Never  Vaping Use   Vaping status: Never Used  Substance and Sexual Activity   Alcohol use: Not Currently    Alcohol/week: 1.0 - 2.0 standard drink of alcohol    Types: 1 - 2 Standard drinks or equivalent per week   Drug use: No   Sexual activity: Not Currently  Other Topics Concern   Not on file  Social History Narrative   Patient lives in 1 story home with sister.   Caffeine Use: 2 cups daily; sodas occasionally   Some college education   Works as Lawyer for [employer]   Social Drivers of Health   Financial Resource Strain: Low Risk  (05/18/2023)   Overall Financial Resource Strain (CARDIA)    Difficulty of Paying Living Expenses: Not hard at all  Food Insecurity: No Food Insecurity (05/18/2023)   Hunger Vital Sign    Worried About Running Out of Food in the Last Year: Never true    Ran Out of Food in the Last Year: Never true  Transportation Needs: No Transportation Needs (05/18/2023)   PRAPARE - Administrator, Civil Service (Medical): No    Lack of Transportation (Non-Medical): No  Physical Activity: Insufficiently Active (05/18/2023)   Exercise Vital Sign    Days of Exercise per Week: 4 days    Minutes of Exercise per Session: 30 min  Stress: No Stress Concern Present (05/18/2023)   [employer] of Occupational Health - Occupational Stress Questionnaire    Feeling of Stress : Only a little  Social Connections: Moderately Integrated (05/18/2023)  Social Advertising account executive    Frequency of Communication with Friends and Family: More than three times a week    Frequency of Social Gatherings with Friends and Family: More than three times a week    Attends Religious Services: More than 4 times per year    Active Member of [employer] or Organizations: Yes    Attends Banker Meetings: More than 4 times per year    Marital Status: Separated  Intimate Partner Violence: Not At Risk (05/18/2023)   Humiliation, Afraid, Rape, and Kick questionnaire    Fear of Current or Ex-Partner: No    Emotionally Abused: No    Physically Abused: No    Sexually Abused: No   Current Outpatient Medications on File Prior to Visit  Medication Sig Dispense Refill   Accu-Chek FastClix Lancets MISC Use to check blood sugar five times daily. 510 each 2   ACCU-CHEK GUIDE test strip Use to check blood sugar twice a day. E11.9 100 each 2   amLODipine  (NORVASC ) 5 MG tablet Take 1 tablet (5 mg total) by mouth daily. 90 tablet 0   aspirin  81 MG chewable tablet Chew 1 tablet (81 mg total) by mouth daily. 30 tablet 1   atorvastatin  (LIPITOR ) 40 MG tablet Take 40 mg by mouth daily.     benztropine  (COGENTIN ) 0.5 MG tablet Take 1 tablet (0.5 mg total) by mouth at bedtime. 90 tablet 0   brexpiprazole  (REXULTI ) 1 MG TABS tablet Take 1 tablet (1 mg total) by mouth daily. 90 tablet 0   buPROPion  (WELLBUTRIN  XL) 300 MG 24 hr tablet Take 1 tablet (300 mg total) by mouth daily. 90 tablet 0   Continuous Blood Gluc Receiver (DEXCOM G7 RECEIVER) DEVI 1 Act by Does not apply route daily. 2 each 5   Continuous Glucose Sensor (DEXCOM G7 SENSOR) MISC 1 Act by Does not apply route daily. 6 each 3   Dexlansoprazole  30 MG capsule DR Take 1 capsule by mouth once daily 90 capsule 0   ezetimibe  (ZETIA ) 10 MG tablet Take 1 tablet by mouth once daily. 90 tablet 0   gabapentin  (NEURONTIN ) 100 MG capsule Take 1 capsule (100 mg total) by mouth 3 (three) times daily. 270 capsule 1    glucose blood (ACCU-CHEK GUIDE TEST) test strip 1 each by Other route as needed for other. Use as instructed     glucose blood (ACCU-CHEK GUIDE TEST) test strip Use to check blood sugar twice a day. E11.9 200 each 3   HYDROcodone -acetaminophen  (NORCO) 5-325 MG tablet Take 1 tablet by mouth every 6 (six) hours as needed for moderate pain (pain score 4-6). 100 tablet 0   insulin  glargine, 1 Unit Dial , (TOUJEO  SOLOSTAR) 300 UNIT/ML Solostar Pen Inject 32-34 Units into the skin daily. 12 mL 3   insulin  lispro (HUMALOG  KWIKPEN) 200 UNIT/ML KwikPen Inject 8-10 Units into the skin 2 (two) times daily before a meal. 12 mL 3   Insulin  Pen Needle 32G X 4 MM MISC Use 3x a day 300 each 3   levothyroxine  (SYNTHROID ) 88 MCG tablet Take 1 tablet by mouth once daily 60 tablet 1   lubiprostone  (AMITIZA ) 24 MCG capsule Take 1 capsule (24 mcg total) by mouth 2 (two) times daily with a meal. (Patient taking differently: Take 24 mcg by mouth as needed for constipation.) 180 capsule 1   Semaglutide , 1 MG/DOSE, (OZEMPIC , 1 MG/DOSE,) 4 MG/3ML SOPN Inject 1 mg into the skin once a week. 9 mL 3  terazosin  (HYTRIN ) 2 MG capsule Take 1 capsule (2 mg total) by mouth at bedtime. 90 capsule 3   torsemide  (DEMADEX ) 20 MG tablet Take 1 tablet (20 mg total) by mouth daily. 90 tablet 0   traZODone  (DESYREL ) 50 MG tablet Take 1 tablet (50 mg total) by mouth at bedtime as needed for sleep. 90 tablet 0   valsartan  (DIOVAN ) 80 MG tablet Take 80 mg by mouth daily.     No current facility-administered medications on file prior to visit.   Allergies  Allergen Reactions   Anesthetics, Ester Anaphylaxis   Family History  Problem Relation Age of Onset   Diabetes Mother    Heart disease Mother        Before age 54   Cancer Father        Lung   Hypertension Sister    Diabetes Sister    Diabetes Sister    Diabetes Sister    PE: BP 130/64   Pulse 100   Ht 5' 2 (1.575 m)   Wt 142 lb 3.2 oz (64.5 kg)   SpO2 95%   BMI 26.01  kg/m  Wt Readings from Last 10 Encounters:  02/16/24 142 lb 3.2 oz (64.5 kg)  02/13/24 146 lb 12.8 oz (66.6 kg)  01/26/24 143 lb 12.8 oz (65.2 kg)  10/18/23 145 lb (65.8 kg)  09/23/23 147 lb (66.7 kg)  09/06/23 143 lb 3.2 oz (65 kg)  08/16/23 146 lb 3.2 oz (66.3 kg)  05/18/23 127 lb (57.6 kg)  05/04/23 127 lb 12.8 oz (58 kg)  05/03/23 127 lb (57.6 kg)   Constitutional: normal weight, in NAD Eyes: EOMI, no exophthalmos ENT: no thyromegaly, no cervical lymphadenopathy Cardiovascular: Tachycardia, RR, No MRG Respiratory: CTA B Musculoskeletal: no deformities Skin: no rashes Neurological: very slight tremor with outstretched hands  ASSESSMENT: 1. DM2, insulin -dependent, uncontrolled, with complications - CVA - carotid stenosis - PAD - CKD stage 4 - PN - Hypoglycemia - Nonketotic hyperglycemic state in 2017  2.  Hypothyroidism  3.  Multinodular goiter  PLAN:  1. Patient with longstanding, uncontrolled, type 2 diabetes, on basal-bolus insulin  regimen and weekly GLP-1 receptor agonist, with very high HbA1c earlier in the year, at 11.0%, but improved at last visit.  At that time, we increased her Toujeo  and Ozempic  dose and we discussed about dietary changes, for example stopping regular sugar in her coffee and switching to Stevia.  She had another HbA1c recently and this was much better, at 7.7%. CGM interpretation:  -At today's visit, we reviewed her CGM downloads: It appears that 52% of values are in target range (goal >70%), while 46% are higher than 180 (goal <25%), and 2% are lower than 70 (goal <4%).  The calculated average blood sugar is 182.  The projected HbA1c for the next 3 months (GMI) is 7.7%. -Reviewing the CGM trends, sugars appear to be dropping too low at night and then increasing in the second half of the night but with the largest being after breakfast.  She also has a slight hyperglycemic peak in the afternoon, blood sugars are trending down after dinner.  We  discussed about increasing the dose of Humalog  for the first meal of her day, continuing the dose with lunch and decreasing the dose with dinner.  We discussed about titrating the doses of Humalog  based on the patterns that she sees on the CGM.  Also, to avoid low blood sugars and the middle of the night, I advised her to reduce her  Toujeo  dose.  Will continue the Ozempic  for now. - I advised her to: Patient Instructions  Please continue: - Ozempic  0.5 mg weekly  Please decrease: - Toujeo  28 units daily  Please increase Humalog  15 min before meals: - 12-15 units before B'fast - 8-10 units before lunch - 6-8 units before dinner  Please continue levothyroxine  88 mcg daily.  Take the thyroid  hormone every day, with water, at least 30 minutes before breakfast, separated by at least 4 hours from: - acid reflux medications - calcium  - iron - multivitamins  Please return in 3-4 months.  - advised to check sugars at different times of the day - 4x a day, rotating check times - advised for yearly eye exams >> she is UTD - return to clinic in 3-4 months  2.  Hypothyroidism - latest thyroid  labs reviewed with pt. >> normal: Lab Results  Component Value Date   TSH 3.67 01/26/2024  - she continues on LT4 88 mcg daily - pt feels good on this dose. - we discussed about taking the thyroid  hormone every day, with water, >30 minutes before breakfast, separated by >4 hours from acid reflux medications, calcium , iron, multivitamins. Pt. is taking it correctly.  3.  Multinodular goiter - no neck compression symptoms - Most recent thyroid  ultrasound showed stability of her nodules over 6 years - Will continue to follow her clinically, no further imaging needed - she is preparing to have a carotid ultrasound  Lela Fendt, MD PhD [facility]

## 2024-02-21 ENCOUNTER — Encounter (HOSPITAL_COMMUNITY): Payer: Self-pay | Admitting: Psychiatry

## 2024-02-21 ENCOUNTER — Ambulatory Visit (HOSPITAL_COMMUNITY)
Admission: RE | Admit: 2024-02-21 | Discharge: 2024-02-21 | Disposition: A | Discharge status: Home / Self Care | Source: Ambulatory Visit | Attending: Family | Admitting: Family

## 2024-02-21 ENCOUNTER — Telehealth (HOSPITAL_COMMUNITY): Admitting: Psychiatry

## 2024-02-21 DIAGNOSIS — F331 Major depressive disorder, recurrent, moderate: Secondary | ICD-10-CM

## 2024-02-21 DIAGNOSIS — R251 Tremor, unspecified: Secondary | ICD-10-CM | POA: Diagnosis not present

## 2024-02-21 DIAGNOSIS — F411 Generalized anxiety disorder: Secondary | ICD-10-CM

## 2024-02-21 DIAGNOSIS — I10 Essential (primary) hypertension: Secondary | ICD-10-CM | POA: Diagnosis not present

## 2024-02-21 DIAGNOSIS — I6523 Occlusion and stenosis of bilateral carotid arteries: Secondary | ICD-10-CM | POA: Diagnosis not present

## 2024-02-21 MED ORDER — BREXPIPRAZOLE 1 MG PO TABS
1.0000 mg | ORAL_TABLET | Freq: Every day | ORAL | 0 refills | Status: DC
Start: 2024-02-21 — End: 2024-04-19

## 2024-02-21 MED ORDER — TRAZODONE HCL 50 MG PO TABS: 50.0000 mg | ORAL_TABLET | Freq: Every evening | ORAL | 0 refills | Status: DC | PRN

## 2024-02-21 MED ORDER — BENZTROPINE MESYLATE 0.5 MG PO TABS: 0.5000 mg | ORAL_TABLET | Freq: Every day | ORAL | 0 refills | Status: DC

## 2024-02-21 MED ORDER — BUPROPION HCL ER (XL) 300 MG PO TB24: 300.0000 mg | ORAL_TABLET | Freq: Every day | ORAL | 0 refills | Status: DC

## 2024-02-21 NOTE — Progress Notes (Signed)
 [facility] MD Virtual Progress Note   Patient Location: In Car Provider Location: Home Office  I connect with patient by telephone and verified that I am speaking with correct person by using two identifiers. I discussed the limitations of evaluation and management by telemedicine and the availability of in person appointments. I also discussed with the patient that there may be a patient responsible charge related to this service. The patient expressed understanding and agreed to proceed.  Sonia Skinner [id number] 73 y.o.  02/21/2024 9:29 AM  History of Present Illness:  Patient is evaluated by phone session.  Patient is in the car and cannot do video at this time.  Patient told she has another appointment.  She was last seen in February and she missed appointment.  She is somewhat upset because she do not have the medication but again and ask about missing appointment she did not answer back.  She reported chronic symptoms of fatigue, lack of motivation, energy but denies any suicidal thoughts or homicidal thoughts.  Patient is not interested in therapy.  She reported multiple stressors and 1 the stressor is a primary caretaker of the husband who has back amputee.  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  She reported no tremors or shakes.  She denies any hallucination, paranoia, suicidal thoughts.  She has a visit with her daughter and hemoglobin A1c is much improved.  It dropped from 11.2-7.2.  Her creatinine also improved.  She must keep the current medication.  We have started the trazodone  but again she had not been taking after she ran out the medication.  Past Psychiatric History: H/O taking antidepressant on and off most of life. H/O overdose and inpatient at [facility]. Saw provider at [facility] and given the Strattera, Adderall, Zoloft, Celexa, Lamictal  and Lexapro .  Never  tested for ADHD. No h/o psychosis, mania or hallucination.  In 2001 moved to [facility] [place]  and given amitriptyline  and Wellbutrin  to stop smoking. Prozac  and Paxil  made tired and nausea.     Outpatient Encounter Medications as of 02/21/2024  Medication Sig   Accu-Chek FastClix Lancets MISC Use to check blood sugar five times daily.   ACCU-CHEK GUIDE test strip Use to check blood sugar twice a day. E11.9   amLODipine  (NORVASC ) 5 MG tablet Take 1 tablet (5 mg total) by mouth daily.   aspirin  81 MG chewable tablet Chew 1 tablet (81 mg total) by mouth daily.   atorvastatin  (LIPITOR ) 40 MG tablet Take 40 mg by mouth daily.   benztropine  (COGENTIN ) 0.5 MG tablet Take 1 tablet (0.5 mg total) by mouth at bedtime.   brexpiprazole  (REXULTI ) 1 MG TABS tablet Take 1 tablet (1 mg total) by mouth daily.   buPROPion  (WELLBUTRIN  XL) 300 MG 24 hr tablet Take 1 tablet (300 mg total) by mouth daily.   Continuous Blood Gluc Receiver (DEXCOM G7 RECEIVER) DEVI 1 Act by Does not apply route daily.   Continuous Glucose Sensor (DEXCOM G7 SENSOR) MISC 1 Act by Does not apply route daily.   Dexlansoprazole  30 MG capsule DR Take 1 capsule by mouth once daily   ezetimibe  (ZETIA ) 10 MG tablet Take 1 tablet by mouth once daily.   gabapentin  (NEURONTIN ) 100 MG capsule Take 1 capsule (100 mg total) by mouth 3 (three) times daily.   glucose blood (ACCU-CHEK GUIDE TEST) test strip 1 each by Other  route as needed for other. Use as instructed   glucose blood (ACCU-CHEK GUIDE TEST) test strip Use to check blood sugar twice a day. E11.9   HYDROcodone -acetaminophen  (NORCO) 5-325 MG tablet Take 1 tablet by mouth every 6 (six) hours as needed for moderate pain (pain score 4-6).   insulin  glargine, 1 Unit Dial , (TOUJEO  SOLOSTAR) 300 UNIT/ML Solostar Pen Inject 28-30 Units into the skin daily.   insulin  lispro (HUMALOG  KWIKPEN) 200 UNIT/ML KwikPen Inject 8-10 Units into the skin 2 (two) times daily before a meal.   Insulin  Pen Needle  32G X 4 MM MISC Use 3x a day   levothyroxine  (SYNTHROID ) 88 MCG tablet Take 1 tablet by mouth once daily   lubiprostone  (AMITIZA ) 24 MCG capsule Take 1 capsule (24 mcg total) by mouth 2 (two) times daily with a meal. (Patient taking differently: Take 24 mcg by mouth as needed for constipation.)   Semaglutide , 1 MG/DOSE, (OZEMPIC , 1 MG/DOSE,) 4 MG/3ML SOPN Inject 1 mg into the skin once a week.   terazosin  (HYTRIN ) 2 MG capsule Take 1 capsule (2 mg total) by mouth at bedtime.   torsemide  (DEMADEX ) 20 MG tablet Take 1 tablet (20 mg total) by mouth daily.   traZODone  (DESYREL ) 50 MG tablet Take 1 tablet (50 mg total) by mouth at bedtime as needed for sleep.   valsartan  (DIOVAN ) 80 MG tablet Take 80 mg by mouth daily.   No facility-administered encounter medications on file as of 02/21/2024.    Recent Results (from the past 2160 hours)  Lipid panel     Status: Abnormal   Collection Time: 01/26/24  3:40 PM  Result Value Ref Range   Cholesterol 170 0 - 200 mg/dL    Comment: ATP III Classification       Desirable:  < 200 mg/dL               Borderline High:  200 - 239 mg/dL          High:  > = 759 mg/dL   Triglycerides 846.9 (H) 0.0 - 149.0 mg/dL    Comment: Normal:  <849 mg/dLBorderline High:  150 - 199 mg/dL   HDL 48.49 >60.99 mg/dL   VLDL 69.3 0.0 - 59.9 mg/dL   LDL Cholesterol 88 0 - 99 mg/dL   Total CHOL/HDL Ratio 3     Comment:                Men          Women1/2 Average Risk     3.4          3.3Average Risk          5.0          4.42X Average Risk          9.6          7.13X Average Risk          15.0          11.0                       NonHDL 118.65     Comment: NOTE:  Non-HDL goal should be 30 mg/dL higher than patient's LDL goal (i.e. LDL goal of < 70 mg/dL, would have non-HDL goal of < 100 mg/dL)  Basic metabolic panel with GFR     Status: Abnormal   Collection Time: 01/26/24  3:40 PM  Result Value Ref Range   Sodium 141 135 -  145 mEq/L   Potassium 4.2 3.5 - 5.1 mEq/L   Chloride  106 96 - 112 mEq/L   CO2 26 19 - 32 mEq/L   Glucose, Bld 154 (H) 70 - 99 mg/dL   BUN 19 6 - 23 mg/dL   Creatinine, Ser 8.31 (H) 0.40 - 1.20 mg/dL   GFR 70.21 (L) >39.99 mL/min    Comment: Calculated using the CKD-EPI Creatinine Equation (2021)   Calcium  9.8 8.4 - 10.5 mg/dL  CBC with Differential/Platelet     Status: Abnormal   Collection Time: 01/26/24  3:40 PM  Result Value Ref Range   WBC 11.3 (H) 4.0 - 10.5 K/uL   RBC 4.28 3.87 - 5.11 Mil/uL   Hemoglobin 11.3 (L) 12.0 - 15.0 g/dL   HCT 63.6 63.9 - 53.9 %   MCV 84.9 78.0 - 100.0 fl   MCHC 31.2 30.0 - 36.0 g/dL   RDW 85.5 88.4 - 84.4 %   Platelets 210.0 150.0 - 400.0 K/uL   Neutrophils Relative % 54.8 43.0 - 77.0 %   Lymphocytes Relative 33.2 12.0 - 46.0 %   Monocytes Relative 8.4 3.0 - 12.0 %   Eosinophils Relative 2.8 0.0 - 5.0 %   Basophils Relative 0.8 0.0 - 3.0 %   Neutro Abs 6.2 1.4 - 7.7 K/uL   Lymphs Abs 3.7 0.7 - 4.0 K/uL   Monocytes Absolute 0.9 0.1 - 1.0 K/uL   Eosinophils Absolute 0.3 0.0 - 0.7 K/uL   Basophils Absolute 0.1 0.0 - 0.1 K/uL  TSH     Status: None   Collection Time: 01/26/24  3:40 PM  Result Value Ref Range   TSH 3.67 0.35 - 5.50 uIU/mL  Hepatic function panel     Status: Abnormal   Collection Time: 01/26/24  3:40 PM  Result Value Ref Range   Total Bilirubin 0.3 0.2 - 1.2 mg/dL   Bilirubin, Direct 0.0 0.0 - 0.3 mg/dL   Alkaline Phosphatase 143 (H) 39 - 117 U/L   AST 13 0 - 37 U/L   ALT 16 0 - 35 U/L   Total Protein 7.1 6.0 - 8.3 g/dL   Albumin 4.3 3.5 - 5.2 g/dL  Hemoglobin J8r     Status: Abnormal   Collection Time: 01/26/24  3:40 PM  Result Value Ref Range   Hgb A1c MFr Bld 7.7 (H) 4.6 - 6.5 %    Comment: Glycemic Control Guidelines for People with Diabetes:Non Diabetic:  <6%Goal of Therapy: <7%Additional Action Suggested:  >8%   VITAMIN D  25 Hydroxy (Vit-D Deficiency, Fractures)     Status: None   Collection Time: 01/26/24  3:40 PM  Result Value Ref Range   VITD 61.70 30.00 - 100.00  ng/mL  Phosphorus     Status: None   Collection Time: 01/26/24  3:40 PM  Result Value Ref Range   Phosphorus 3.5 2.3 - 4.6 mg/dL     Psychiatric Specialty Exam: Physical Exam  Review of Systems  Weight 142 lb (64.4 kg).There is no height or weight on file to calculate BMI.  General Appearance: NA  Eye Contact:  NA  Speech:  Slow  Volume:  Decreased  Mood:  Dysphoric and Irritable  Affect:  NA  Thought Process:  Descriptions of Associations: Intact  Orientation:  Full (Time, Place, and Person)  Thought Content:  Rumination  Suicidal Thoughts:  No  Homicidal Thoughts:  No  Memory:  Immediate;   Good Recent;   Good Remote;   Fair  Judgement:  Fair  Insight:  Shallow  Psychomotor Activity:  NA  Concentration:  Concentration: Fair and Attention Span: Fair  Recall:  Good  Fund of Knowledge:  Good  Language:  Good  Akathisia:  No  Handed:  Right  AIMS (if indicated):     Assets:  Communication Skills Desire for Improvement Housing Talents/Skills Transportation  ADL's:  Intact  Cognition:  WNL  Sleep:  fair       01/26/2024    2:45 PM 05/18/2023   12:55 PM 01/27/2023   10:58 AM 03/29/2022   10:08 AM 02/25/2022    1:05 PM  Depression screen PHQ 2/9  Decreased Interest 0 0 1 2 1   Down, Depressed, Hopeless 0 1 1  1   PHQ - 2 Score 0 1 2 2 2   Altered sleeping 0 0 1 2 1   Tired, decreased energy 0 1 3 3 3   Change in appetite 0 1 3 2 1   Feeling bad or failure about yourself  0 0 0 1 1  Trouble concentrating 0 0 0 1 1  Moving slowly or fidgety/restless 0 0 0 2 0  Suicidal thoughts 0 0 0 1 0  PHQ-9 Score 0 3 9 14 9   Difficult doing work/chores Not difficult at all Somewhat difficult  Not difficult at all     Assessment/Plan: Moderate episode of recurrent major depressive disorder (HCC) - Plan: brexpiprazole  (REXULTI ) 1 MG TABS tablet, benztropine  (COGENTIN ) 0.5 MG tablet, buPROPion  (WELLBUTRIN  XL) 300 MG 24 hr tablet, traZODone  (DESYREL ) 50 MG tablet  GAD (generalized  anxiety disorder) - Plan: brexpiprazole  (REXULTI ) 1 MG TABS tablet, buPROPion  (WELLBUTRIN  XL) 300 MG 24 hr tablet  Tremor - Plan: benztropine  (COGENTIN ) 0.5 MG tablet  I reviewed blood work results.  Hemoglobin A1c improved.  Emphasis given about compliance with follow-up and medication.  Will like to go back on trazodone  as she did took but ran out and not sleeping well.  I encourage to have an in person visit in the future.  Will continue Wellbutrin  XL 300 mg daily, Rexulti  1 mg at bedtime, Cogentin  0.5 mg at bedtime and trazodone  50 mg at bedtime.  She is not interested in therapy.  Recommended to call us  back if she is any question or any concern.  Follow-up in 3 months.   Follow Up Instructions:     I discussed the assessment and treatment plan with the patient. The patient was provided an opportunity to ask questions and all were answered. The patient agreed with the plan and demonstrated an understanding of the instructions.   The patient was advised to call back or seek an in-person evaluation if the symptoms worsen or if the condition fails to improve as anticipated.    Collaboration of Care: Other provider involved in patient's care AEB notes are available in epic to review  Patient/Guardian was advised Release of Information must be obtained prior to any record release in order to collaborate their care with an outside provider. Patient/Guardian was advised if they have not already done so to contact the registration department to sign all necessary forms in order for us  to release information regarding their care.   Consent: Patient/Guardian gives verbal consent for treatment and assignment of benefits for services provided during this visit. Patient/Guardian expressed understanding and agreed to proceed.     Total encounter time 21 minutes which includes face-to-face time, chart reviewed, care coordination, order entry and documentation during this encounter.   Note: This  document was prepared by  [employer] loss adjuster and any errors that results from this process are unintentional.    Leni ONEIDA Client, MD 02/21/2024

## 2024-02-23 ENCOUNTER — Ambulatory Visit (HOSPITAL_BASED_OUTPATIENT_CLINIC_OR_DEPARTMENT_OTHER): Payer: Self-pay | Admitting: Family

## 2024-02-27 ENCOUNTER — Encounter (HOSPITAL_BASED_OUTPATIENT_CLINIC_OR_DEPARTMENT_OTHER): Payer: Self-pay | Admitting: *Deleted

## 2024-02-28 ENCOUNTER — Ambulatory Visit
Admission: RE | Admit: 2024-02-28 | Discharge: 2024-02-28 | Disposition: A | Discharge status: Home / Self Care | Source: Ambulatory Visit | Attending: Internal Medicine | Admitting: Internal Medicine

## 2024-02-28 DIAGNOSIS — Z1231 Encounter for screening mammogram for malignant neoplasm of breast: Secondary | ICD-10-CM | POA: Diagnosis not present

## 2024-03-01 ENCOUNTER — Ambulatory Visit: Admitting: Internal Medicine

## 2024-03-06 ENCOUNTER — Other Ambulatory Visit: Payer: Self-pay | Admitting: Internal Medicine

## 2024-03-06 DIAGNOSIS — E785 Hyperlipidemia, unspecified: Secondary | ICD-10-CM

## 2024-03-26 ENCOUNTER — Other Ambulatory Visit: Payer: Self-pay | Admitting: Internal Medicine

## 2024-03-26 DIAGNOSIS — K2100 Gastro-esophageal reflux disease with esophagitis, without bleeding: Secondary | ICD-10-CM

## 2024-04-16 NOTE — Progress Notes (Deleted)
 Cardiology Office Note    Patient Name: Sonia Skinner Date of Encounter: 04/16/2024  Primary Care Provider:  Joshua Debby CROME, MD Primary Cardiologist:  None Primary Electrophysiologist: None   Past Medical History    Past Medical History:  Diagnosis Date   Ankle fracture    Left   Anxiety    Carotid artery occlusion    Closed fracture of left distal fibula 09/16/2017   Complication of anesthesia    ALLERY TO ESTER BASE   Depression    early 30s   Diabetes mellitus    INSULIN  DEPENDENT   GERD (gastroesophageal reflux disease)    Headache    years ago   Hypertension    Pneumonia    Stroke [facility]) March 2015    left MCA infarct, slight weakness on left side   Thyroid  disease     History of Present Illness  Sonia Skinner is a 73 y.o. female with PMH of CVA s/p 2015 with left-sided weakness, HTN, DM, carotid artery occlusion s/p left CEA 2015 and stent placed 11/2021, CKD stage IIIb thyroid  disease, anterior cervical fusion who presents today for 78-month follow-up.  Ms. Cabeza was last seen on 02/13/2024 by Reche Finder, NP for follow-up.  During her visit she reported complaints of fatigue and waking up feeling tired.  She also noted some swelling in her feet that is worse by the end of the day and denied any chest pain or exertional dyspnea.  Her most recent ischemic evaluation was 04/2022 by nuclear stress test that showed low risk normal result.  She also underwent home sleep study and had terazosin  increased to 2 mg nightly.  She also underwent updated carotid ultrasound that showed mild stenosis on the right (1-39%)  Patient denies chest pain, palpitations, dyspnea, PND, orthopnea, nausea, vomiting, dizziness, syncope, edema, weight gain, or early satiety.   Discussed the use of AI scribe software for clinical note transcription with the patient, who gave verbal consent to proceed.  History of Present Illness    ***Notes: Sleep study completed??   Review of Systems  Please  see the history of present illness.    All other systems reviewed and are otherwise negative except as noted above.  Physical Exam    Wt Readings from Last 3 Encounters:  02/16/24 142 lb 3.2 oz (64.5 kg)  02/13/24 146 lb 12.8 oz (66.6 kg)  01/26/24 143 lb 12.8 oz (65.2 kg)   CD:Uyzmz were no vitals filed for this visit.,There is no height or weight on file to calculate BMI. GEN: Well nourished, well developed in no acute distress Neck: No JVD; No carotid bruits Pulmonary: Clear to auscultation without rales, wheezing or rhonchi  Cardiovascular: Normal rate. Regular rhythm. Normal S1. Normal S2.   Murmurs: There is no murmur.  ABDOMEN: Soft, non-tender, non-distended EXTREMITIES:  No edema; No deformity   EKG/LABS/ Recent Cardiac Studies   ECG personally reviewed by me today - ***  Risk Assessment/Calculations:   {Does this patient have ATRIAL FIBRILLATION?:[phone]}      Lab Results  Component Value Date   WBC 11.3 (H) 01/26/2024   HGB 11.3 (L) 01/26/2024   HCT 36.3 01/26/2024   MCV 84.9 01/26/2024   PLT 210.0 01/26/2024   Lab Results  Component Value Date   CREATININE 1.68 (H) 01/26/2024   BUN 19 01/26/2024   NA 141 01/26/2024   K 4.2 01/26/2024   CL 106 01/26/2024   CO2 26 01/26/2024   Lab Results  Component  Value Date   CHOL 170 01/26/2024   HDL 51.50 01/26/2024   LDLCALC 88 01/26/2024   LDLDIRECT 141 (H) 03/04/2010   TRIG 153.0 (H) 01/26/2024   CHOLHDL 3 01/26/2024    Lab Results  Component Value Date   HGBA1C 7.7 (H) 01/26/2024   Assessment & Plan    Assessment and Plan Assessment & Plan     1.  History of CVA -CVA due to stenosis of left middle cerebral artery with residual left-sided weakness.   2.  Essential hypertension  3.Carotid artery disease: -MRA of the neck and head showed 70% stenosis of proximal cervical left ICA. -Carotid artery occlusion s/p CEA in 2015  4.  Hyperlipidemia  5.  Fatigue      Disposition: Follow-up with  None or APP in *** months {Are you ordering a CV Procedure (e.g. stress test, cath, DCCV, TEE, etc)?   Press F2        :[id number]}   Signed, Wyn Raddle, Jackee Shove, NP 04/16/2024, 1:17 PM [facility]

## 2024-04-17 ENCOUNTER — Ambulatory Visit: Attending: Nurse Practitioner | Admitting: Nurse Practitioner

## 2024-04-17 DIAGNOSIS — I63512 Cerebral infarction due to unspecified occlusion or stenosis of left middle cerebral artery: Secondary | ICD-10-CM

## 2024-04-17 DIAGNOSIS — I6523 Occlusion and stenosis of bilateral carotid arteries: Secondary | ICD-10-CM

## 2024-04-17 DIAGNOSIS — R5383 Other fatigue: Secondary | ICD-10-CM

## 2024-04-17 DIAGNOSIS — E785 Hyperlipidemia, unspecified: Secondary | ICD-10-CM

## 2024-04-17 DIAGNOSIS — I10 Essential (primary) hypertension: Secondary | ICD-10-CM

## 2024-04-19 ENCOUNTER — Telehealth (HOSPITAL_BASED_OUTPATIENT_CLINIC_OR_DEPARTMENT_OTHER): Admitting: Psychiatry

## 2024-04-19 ENCOUNTER — Encounter (HOSPITAL_COMMUNITY): Payer: Self-pay | Admitting: Psychiatry

## 2024-04-19 DIAGNOSIS — R251 Tremor, unspecified: Secondary | ICD-10-CM

## 2024-04-19 DIAGNOSIS — F331 Major depressive disorder, recurrent, moderate: Secondary | ICD-10-CM

## 2024-04-19 DIAGNOSIS — F411 Generalized anxiety disorder: Secondary | ICD-10-CM | POA: Diagnosis not present

## 2024-04-19 MED ORDER — BREXPIPRAZOLE 1 MG PO TABS: 1.0000 mg | ORAL_TABLET | Freq: Every evening | ORAL | 0 refills | Status: DC

## 2024-04-19 MED ORDER — BENZTROPINE MESYLATE 0.5 MG PO TABS
0.5000 mg | ORAL_TABLET | Freq: Every day | ORAL | 0 refills | Status: DC
Start: 2024-04-19 — End: 2024-07-09

## 2024-04-19 MED ORDER — TRAZODONE HCL 50 MG PO TABS: 50.0000 mg | ORAL_TABLET | Freq: Every evening | ORAL | 0 refills | Status: DC | PRN

## 2024-04-19 MED ORDER — BUPROPION HCL ER (XL) 300 MG PO TB24: 300.0000 mg | ORAL_TABLET | Freq: Every day | ORAL | 0 refills | Status: DC

## 2024-04-19 NOTE — Progress Notes (Signed)
 [facility] MD Virtual Progress Note   Patient Location: Home Provider Location: Office  I connect with patient by telephone and verified that I am speaking with correct person by using two identifiers. I discussed the limitations of evaluation and management by telemedicine and the availability of in person appointments. I also discussed with the patient that there may be a patient responsible charge related to this service. The patient expressed understanding and agreed to proceed.  Sonia Skinner [id number] 73 y.o.  04/19/2024 12:10 PM  History of Present Illness:  Patient is evaluated by phone session.  She is ready to go to work.  Patient works as a Lawyer.  She reported summer was quite and things are okay but having a hard time sleeping.  She is out of trazodone .  Not sure why because we have [facility] but she reported had not received.  She like to have her prescription called into the [facility] at [facility].  She reported her depression is stable but she feels very tired and exhausted.  She is not sure if the medicine causing the tiredness.  On further questioning she admitted not taking Ozempic  because she had lost significant weight with the Ozempic .  She feels unmotivated to do things.  She denies any crying spells or any feeling of hopelessness or worthlessness.  She does not feel as depressed but her biggest concern is fatigue and lack of motivation.  She reported her husband is getting along better than before.  She has no tremor or shakes or any EPS.  She denies any hallucination, paranoia, suicidal thoughts.  She admitted weight gain but has not checked recently.  She realize she need to go back on Ozempic  to had a better control on weight and her blood sugar.  She used to have hemoglobin A1c running above 11 but her last hemoglobin A1c 7.2.  She has not checked hemoglobin A1c since she stopped the Ozempic .  Past  Psychiatric History: H/O taking antidepressant on and off most of life. H/O overdose and inpatient at [facility]. Saw provider at [facility] and given the Strattera, Adderall, Zoloft, Celexa, Lamictal  and Lexapro .  Never tested for ADHD. No h/o psychosis, mania or hallucination.  In 2001 moved to [facility]  and given amitriptyline  and Wellbutrin  to stop smoking. Prozac  and Paxil  made tired and nausea.    Past Medical History:  Diagnosis Date   Ankle fracture    Left   Anxiety    Carotid artery occlusion    Closed fracture of left distal fibula 09/16/2017   Complication of anesthesia    ALLERY TO ESTER BASE   Depression    early 30s   Diabetes mellitus    INSULIN  DEPENDENT   GERD (gastroesophageal reflux disease)    Headache    years ago   Hypertension    Pneumonia    Stroke [facility]) March 2015    left MCA infarct, slight weakness on left side   Thyroid  disease     Outpatient Encounter Medications as of 04/19/2024  Medication Sig   Accu-Chek FastClix Lancets MISC Use to check blood sugar five times daily.   ACCU-CHEK GUIDE test strip Use to check blood sugar twice a day. E11.9   amLODipine  (NORVASC ) 5 MG tablet Take 1 tablet (5 mg total) by mouth daily.   aspirin  81 MG chewable tablet Chew 1 tablet (81 mg total) by mouth daily.   atorvastatin  (LIPITOR ) 40 MG tablet Take  40 mg by mouth daily.   benztropine  (COGENTIN ) 0.5 MG tablet Take 1 tablet (0.5 mg total) by mouth at bedtime.   brexpiprazole  (REXULTI ) 1 MG TABS tablet Take 1 tablet (1 mg total) by mouth daily.   buPROPion  (WELLBUTRIN  XL) 300 MG 24 hr tablet Take 1 tablet (300 mg total) by mouth daily.   Continuous Blood Gluc Receiver (DEXCOM G7 RECEIVER) DEVI 1 Act by Does not apply route daily.   Continuous Glucose Sensor (DEXCOM G7 SENSOR) MISC 1 Act by Does not apply route daily.   Dexlansoprazole  30 MG capsule DR Take 1 capsule by mouth once daily   ezetimibe  (ZETIA ) 10 MG tablet Take 1 tablet by mouth once daily.    gabapentin  (NEURONTIN ) 100 MG capsule Take 1 capsule (100 mg total) by mouth 3 (three) times daily.   glucose blood (ACCU-CHEK GUIDE TEST) test strip 1 each by Other route as needed for other. Use as instructed   glucose blood (ACCU-CHEK GUIDE TEST) test strip Use to check blood sugar twice a day. E11.9   HYDROcodone -acetaminophen  (NORCO) 5-325 MG tablet Take 1 tablet by mouth every 6 (six) hours as needed for moderate pain (pain score 4-6).   insulin  glargine, 1 Unit Dial , (TOUJEO  SOLOSTAR) 300 UNIT/ML Solostar Pen Inject 28-30 Units into the skin daily.   insulin  lispro (HUMALOG  KWIKPEN) 200 UNIT/ML KwikPen Inject 8-10 Units into the skin 2 (two) times daily before a meal.   Insulin  Pen Needle 32G X 4 MM MISC Use 3x a day   levothyroxine  (SYNTHROID ) 88 MCG tablet Take 1 tablet by mouth once daily   lubiprostone  (AMITIZA ) 24 MCG capsule Take 1 capsule (24 mcg total) by mouth 2 (two) times daily with a meal. (Patient taking differently: Take 24 mcg by mouth as needed for constipation.)   Semaglutide , 1 MG/DOSE, (OZEMPIC , 1 MG/DOSE,) 4 MG/3ML SOPN Inject 1 mg into the skin once a week.   terazosin  (HYTRIN ) 2 MG capsule Take 1 capsule (2 mg total) by mouth at bedtime.   torsemide  (DEMADEX ) 20 MG tablet Take 1 tablet (20 mg total) by mouth daily.   traZODone  (DESYREL ) 50 MG tablet Take 1 tablet (50 mg total) by mouth at bedtime as needed for sleep.   valsartan  (DIOVAN ) 80 MG tablet Take 80 mg by mouth daily.   No facility-administered encounter medications on file as of 04/19/2024.    Recent Results (from the past 2160 hours)  Lipid panel     Status: Abnormal   Collection Time: 01/26/24  3:40 PM  Result Value Ref Range   Cholesterol 170 0 - 200 mg/dL    Comment: ATP III Classification       Desirable:  < 200 mg/dL               Borderline High:  200 - 239 mg/dL          High:  > = 759 mg/dL   Triglycerides 846.9 (H) 0.0 - 149.0 mg/dL    Comment: Normal:  <849 mg/dLBorderline High:  150 - 199  mg/dL   HDL 48.49 >60.99 mg/dL   VLDL 69.3 0.0 - 59.9 mg/dL   LDL Cholesterol 88 0 - 99 mg/dL   Total CHOL/HDL Ratio 3     Comment:                Men          Women1/2 Average Risk     3.4  3.3Average Risk          5.0          4.42X Average Risk          9.6          7.13X Average Risk          15.0          11.0                       NonHDL 118.65     Comment: NOTE:  Non-HDL goal should be 30 mg/dL higher than patient's LDL goal (i.e. LDL goal of < 70 mg/dL, would have non-HDL goal of < 100 mg/dL)  Basic metabolic panel with GFR     Status: Abnormal   Collection Time: 01/26/24  3:40 PM  Result Value Ref Range   Sodium 141 135 - 145 mEq/L   Potassium 4.2 3.5 - 5.1 mEq/L   Chloride 106 96 - 112 mEq/L   CO2 26 19 - 32 mEq/L   Glucose, Bld 154 (H) 70 - 99 mg/dL   BUN 19 6 - 23 mg/dL   Creatinine, Ser 8.31 (H) 0.40 - 1.20 mg/dL   GFR 70.21 (L) >39.99 mL/min    Comment: Calculated using the CKD-EPI Creatinine Equation (2021)   Calcium  9.8 8.4 - 10.5 mg/dL  CBC with Differential/Platelet     Status: Abnormal   Collection Time: 01/26/24  3:40 PM  Result Value Ref Range   WBC 11.3 (H) 4.0 - 10.5 K/uL   RBC 4.28 3.87 - 5.11 Mil/uL   Hemoglobin 11.3 (L) 12.0 - 15.0 g/dL   HCT 63.6 63.9 - 53.9 %   MCV 84.9 78.0 - 100.0 fl   MCHC 31.2 30.0 - 36.0 g/dL   RDW 85.5 88.4 - 84.4 %   Platelets 210.0 150.0 - 400.0 K/uL   Neutrophils Relative % 54.8 43.0 - 77.0 %   Lymphocytes Relative 33.2 12.0 - 46.0 %   Monocytes Relative 8.4 3.0 - 12.0 %   Eosinophils Relative 2.8 0.0 - 5.0 %   Basophils Relative 0.8 0.0 - 3.0 %   Neutro Abs 6.2 1.4 - 7.7 K/uL   Lymphs Abs 3.7 0.7 - 4.0 K/uL   Monocytes Absolute 0.9 0.1 - 1.0 K/uL   Eosinophils Absolute 0.3 0.0 - 0.7 K/uL   Basophils Absolute 0.1 0.0 - 0.1 K/uL  TSH     Status: None   Collection Time: 01/26/24  3:40 PM  Result Value Ref Range   TSH 3.67 0.35 - 5.50 uIU/mL  Hepatic function panel     Status: Abnormal   Collection Time:  01/26/24  3:40 PM  Result Value Ref Range   Total Bilirubin 0.3 0.2 - 1.2 mg/dL   Bilirubin, Direct 0.0 0.0 - 0.3 mg/dL   Alkaline Phosphatase 143 (H) 39 - 117 U/L   AST 13 0 - 37 U/L   ALT 16 0 - 35 U/L   Total Protein 7.1 6.0 - 8.3 g/dL   Albumin 4.3 3.5 - 5.2 g/dL  Hemoglobin J8r     Status: Abnormal   Collection Time: 01/26/24  3:40 PM  Result Value Ref Range   Hgb A1c MFr Bld 7.7 (H) 4.6 - 6.5 %    Comment: Glycemic Control Guidelines for People with Diabetes:Non Diabetic:  <6%Goal of Therapy: <7%Additional Action Suggested:  >8%   VITAMIN D  25 Hydroxy (Vit-D Deficiency, Fractures)     Status: None  Collection Time: 01/26/24  3:40 PM  Result Value Ref Range   VITD 61.70 30.00 - 100.00 ng/mL  Phosphorus     Status: None   Collection Time: 01/26/24  3:40 PM  Result Value Ref Range   Phosphorus 3.5 2.3 - 4.6 mg/dL     Psychiatric Specialty Exam: Physical Exam  Review of Systems  There were no vitals taken for this visit.There is no height or weight on file to calculate BMI.  General Appearance: NA  Eye Contact:  NA  Speech:  Slow  Volume:  Decreased  Mood:  tired  Affect:  NA  Thought Process:  Goal Directed  Orientation:  Full (Time, Place, and Person)  Thought Content:  WDL  Suicidal Thoughts:  No  Homicidal Thoughts:  No  Memory:  Immediate;   Good Recent;   Good Remote;   Good  Judgement:  Intact  Insight:  Present  Psychomotor Activity:  Decreased  Concentration:  Concentration: Fair and Attention Span: Fair  Recall:  Good  Fund of Knowledge:  Good  Language:  Good  Akathisia:  No  Handed:  Right  AIMS (if indicated):     Assets:  Communication Skills Desire for Improvement Housing Talents/Skills Transportation  ADL's:  Intact  Cognition:  WNL  Sleep:  fair       01/26/2024    2:45 PM 05/18/2023   12:55 PM 01/27/2023   10:58 AM 03/29/2022   10:08 AM 02/25/2022    1:05 PM  Depression screen PHQ 2/9  Decreased Interest 0 0 1 2 1   Down,  Depressed, Hopeless 0 1 1  1   PHQ - 2 Score 0 1 2 2 2   Altered sleeping 0 0 1 2 1   Tired, decreased energy 0 1 3 3 3   Change in appetite 0 1 3 2 1   Feeling bad or failure about yourself  0 0 0 1 1  Trouble concentrating 0 0 0 1 1  Moving slowly or fidgety/restless 0 0 0 2 0  Suicidal thoughts 0 0 0 1 0  PHQ-9 Score 0 3 9 14 9   Difficult doing work/chores Not difficult at all Somewhat difficult  Not difficult at all     Assessment/Plan: Moderate episode of recurrent major depressive disorder (HCC) - Plan: brexpiprazole  (REXULTI ) 1 MG TABS tablet, traZODone  (DESYREL ) 50 MG tablet, buPROPion  (WELLBUTRIN  XL) 300 MG 24 hr tablet, benztropine  (COGENTIN ) 0.5 MG tablet  GAD (generalized anxiety disorder) - Plan: brexpiprazole  (REXULTI ) 1 MG TABS tablet, buPROPion  (WELLBUTRIN  XL) 300 MG 24 hr tablet  Tremor - Plan: benztropine  (COGENTIN ) 0.5 MG tablet  Patient is 73 year old African-American female with history of hypertension, CVA, GERD, diabetes, major depressive disorder, anxiety and tremors.  Discussed chronic fatigue which could be not sleeping well.  Patient told that she is out of trazodone  even though [facility] July.  Patient like to change her pharmacy and like all her medication sent to [facility].  I encourage should consider contacting PCP if her blood sugar is high as patient not taking Ozempic .  She also believes gained weight but had not checked recently.  Encouraged to follow-up with primary care.  Encouraged to call back if any issue with the medication at the pharmacy.  Continue Rexulti  1 mg at bedtime, Cogentin  0.5 mg at bedtime, Wellbutrin  XL 300 mg daily and trazodone  50 mg at bedtime.  I also encouraged to take Rexulti  at bedtime as patient taking the  morning this may help her fatigue.  Will follow-up in 3 months.  Recommend to call back if she has any question or any concern.   Follow Up Instructions:     I discussed the  assessment and treatment plan with the patient. The patient was provided an opportunity to ask questions and all were answered. The patient agreed with the plan and demonstrated an understanding of the instructions.   The patient was advised to call back or seek an in-person evaluation if the symptoms worsen or if the condition fails to improve as anticipated.    Collaboration of Care: Other provider involved in patient's care AEB notes are available in epic to review  Patient/Guardian was advised Release of Information must be obtained prior to any record release in order to collaborate their care with an outside provider. Patient/Guardian was advised if they have not already done so to contact the registration department to sign all necessary forms in order for us  to release information regarding their care.   Consent: Patient/Guardian gives verbal consent for treatment and assignment of benefits for services provided during this visit. Patient/Guardian expressed understanding and agreed to proceed.     Total encounter time 24 minutes which includes face-to-face time, chart reviewed, care coordination, order entry and documentation during this encounter.   Note: This document was prepared by [employer] voice dictation technology and any errors that results from this process are unintentional.    Leni ONEIDA Client, MD 04/19/2024

## 2024-04-25 DIAGNOSIS — E1122 Type 2 diabetes mellitus with diabetic chronic kidney disease: Secondary | ICD-10-CM | POA: Diagnosis not present

## 2024-04-25 DIAGNOSIS — E785 Hyperlipidemia, unspecified: Secondary | ICD-10-CM | POA: Diagnosis not present

## 2024-04-25 DIAGNOSIS — N1832 Chronic kidney disease, stage 3b: Secondary | ICD-10-CM | POA: Diagnosis not present

## 2024-04-25 DIAGNOSIS — I639 Cerebral infarction, unspecified: Secondary | ICD-10-CM | POA: Diagnosis not present

## 2024-04-25 DIAGNOSIS — I129 Hypertensive chronic kidney disease with stage 1 through stage 4 chronic kidney disease, or unspecified chronic kidney disease: Secondary | ICD-10-CM | POA: Diagnosis not present

## 2024-04-25 DIAGNOSIS — I739 Peripheral vascular disease, unspecified: Secondary | ICD-10-CM | POA: Diagnosis not present

## 2024-04-29 NOTE — Progress Notes (Signed)
 Cardiology Office Note    Patient Name: LIBBIE BARTLEY Date of Encounter: 04/29/2024  Primary Care Provider:  Joshua Debby CROME, MD Primary Cardiologist:  None Primary Electrophysiologist: None   Past Medical History    Past Medical History:  Diagnosis Date   Ankle fracture    Left   Anxiety    Carotid artery occlusion    Closed fracture of left distal fibula 09/16/2017   Complication of anesthesia    ALLERY TO ESTER BASE   Depression    early 30s   Diabetes mellitus    INSULIN  DEPENDENT   GERD (gastroesophageal reflux disease)    Headache    years ago   Hypertension    Pneumonia    Stroke [facility]) March 2015    left MCA infarct, slight weakness on left side   Thyroid  disease     History of Present Illness  GIRTRUDE ENSLIN is a 73 y.o. female with PMH of CVA s/p 2015 with left-sided weakness, HTN, DM, carotid artery occlusion s/p left CEA 2015 and stent placed 11/2021, CKD stage IIIb thyroid  disease, anterior cervical fusion who presents today for 92-month follow-up.  Ms. Gudiel was last seen on 02/13/2024 by Reche Finder, NP for follow-up.  During her visit she reported complaints of fatigue and waking up feeling tired.  She also noted some swelling in her feet that is worse by the end of the day and denied any chest pain or exertional dyspnea.  Her most recent ischemic evaluation was 04/2022 by nuclear stress test that showed low risk normal result.  She also underwent home sleep study and had terazosin  increased to 2 mg nightly.  She also underwent updated carotid ultrasound that showed mild stenosis on the right (1-39%).  Ms. Herdt presents today for 4-month follow-up. She has been experiencing persistent fatigue and waking up feeling tired since her last visit on June 30th. She notes swelling in her feet, particularly at the end of the day. No chest pain. She has not yet completed the home sleep study. Her current medication regimen includes terazosin , which was increased to 2 mg at bedtime  to aid with sleep, but she continues to wake up during the night and often eats, which she finds disturbing. She mentions eating 'anything in the middle of the night'. Her other medications include ezetimibe , Lipitor , amlodipine , Cogentin , trazodone , and gabapentin . She takes trazodone  once at night and gabapentin  for neuropathy. She has not tried alternatives like Lyrica or pregabalin. A recent carotid ultrasound showed mild stenosis on the right side with no progression.  She reports some swelling in her left foot, which is usually worse during the day. She does not have a blood pressure cuff at home currently, as her sister took hers, and she plans to obtain a new one. Patient denies chest pain, palpitations, dyspnea, PND, orthopnea, nausea, vomiting, dizziness, syncope, edema, weight gain, or early satiety.  Discussed the use of AI scribe software for clinical note transcription with the patient, who gave verbal consent to proceed.  History of Present Illness   Review of Systems  Please see the history of present illness.    All other systems reviewed and are otherwise negative except as noted above.  Physical Exam    Wt Readings from Last 3 Encounters:  02/16/24 142 lb 3.2 oz (64.5 kg)  02/13/24 146 lb 12.8 oz (66.6 kg)  01/26/24 143 lb 12.8 oz (65.2 kg)   CD:Uyzmz were no vitals filed for this visit.,There is no  height or weight on file to calculate BMI. GEN: Well nourished, well developed in no acute distress Neck: No JVD; No carotid bruits Pulmonary: Clear to auscultation without rales, wheezing or rhonchi  Cardiovascular: Normal rate. Regular rhythm. Normal S1. Normal S2.   Murmurs: There is no murmur.  ABDOMEN: Soft, non-tender, non-distended EXTREMITIES:  No edema; No deformity   EKG/LABS/ Recent Cardiac Studies   ECG personally reviewed by me today -none completed today  Risk Assessment/Calculations:          Lab Results  Component Value Date   WBC 11.3 (H) 01/26/2024    HGB 11.3 (L) 01/26/2024   HCT 36.3 01/26/2024   MCV 84.9 01/26/2024   PLT 210.0 01/26/2024   Lab Results  Component Value Date   CREATININE 1.68 (H) 01/26/2024   BUN 19 01/26/2024   NA 141 01/26/2024   K 4.2 01/26/2024   CL 106 01/26/2024   CO2 26 01/26/2024   Lab Results  Component Value Date   CHOL 170 01/26/2024   HDL 51.50 01/26/2024   LDLCALC 88 01/26/2024   LDLDIRECT 141 (H) 03/04/2010   TRIG 153.0 (H) 01/26/2024   CHOLHDL 3 01/26/2024    Lab Results  Component Value Date   HGBA1C 7.7 (H) 01/26/2024   Assessment & Plan    Assessment & Plan  1.  History of CVA -CVA due to stenosis of left middle cerebral artery with residual left-sided weakness.  - Today reports no new symptoms and BP controlled at 138/68. - Continue current GDMT with ASA 81 mg, Lipitor  40 mg, Zetia  10 mg  2.  Essential hypertension: -   Patient's blood pressure initially elevated at 156/77 and was 138/68 on recheck - Continue current BP regimen with Norvasc  5 mg and valsartan  80 mg  3.Carotid artery disease: -MRA of the neck and head showed 70% stenosis of proximal cervical left ICA. -Carotid artery occlusion s/p CEA in 2015 -Right carotid artery stenosis, mild Mild stenosis at 39% with no progression.  4.  Hyperlipidemia: - Patient's last LDL cholesterol was 88 above goal of less than 70 - Continue Lipitor  40 mg and Zetia  10 mg  5.  Sleep disturbance: - Patient reports ongoing sleep difficulties with STOP-BANG of 4 - Previous sleep study ordered however not received and reordered today   Disposition: Follow-up with None or APP in 6 months    Signed, Wyn Raddle, Jackee Shove, NP 04/29/2024, 11:25 AM [facility]

## 2024-04-30 ENCOUNTER — Encounter: Payer: Self-pay | Admitting: Nurse Practitioner

## 2024-04-30 ENCOUNTER — Other Ambulatory Visit: Payer: Self-pay | Admitting: Internal Medicine

## 2024-04-30 ENCOUNTER — Ambulatory Visit: Attending: Nurse Practitioner | Admitting: Nurse Practitioner

## 2024-04-30 ENCOUNTER — Other Ambulatory Visit (HOSPITAL_COMMUNITY): Payer: Self-pay

## 2024-04-30 VITALS — BP 138/68 | HR 80 | Ht 62.0 in | Wt 142.8 lb

## 2024-04-30 DIAGNOSIS — R0683 Snoring: Secondary | ICD-10-CM

## 2024-04-30 DIAGNOSIS — I63512 Cerebral infarction due to unspecified occlusion or stenosis of left middle cerebral artery: Secondary | ICD-10-CM

## 2024-04-30 DIAGNOSIS — I6529 Occlusion and stenosis of unspecified carotid artery: Secondary | ICD-10-CM

## 2024-04-30 DIAGNOSIS — I10 Essential (primary) hypertension: Secondary | ICD-10-CM

## 2024-04-30 DIAGNOSIS — E785 Hyperlipidemia, unspecified: Secondary | ICD-10-CM

## 2024-04-30 MED ORDER — OMRON 3 SERIES BP MONITOR DEVI
1.0000 | Freq: Every day | 0 refills | Status: DC
Start: 2024-04-30 — End: 2024-04-30

## 2024-04-30 MED ORDER — OMRON 3 SERIES BP MONITOR DEVI
1.0000 | Freq: Every day | 0 refills | Status: AC
  Filled 2024-04-30: qty 1, 30d supply, fill #0

## 2024-04-30 NOTE — Patient Instructions (Addendum)
 Medication Instructions:  A prescription for a blood pressure cuff has been sent to the pharmacy downstairs. Please stop by to pick it up. The cost is approximately $35, though some insurances plans may cover it. Be sure to use the cuff regularly to monitor blood pressure as recommended by your provider.  DRINK A PROTEIN SHAKE BEFORE BED *If you need a refill on your cardiac medications before your next appointment, please call your pharmacy*  Lab Work: NONE ORDERED If you have labs (blood work) drawn today and your tests are completely normal, you will receive your results only by: MyChart Message (if you have MyChart) OR A paper copy in the mail If you have any lab test that is abnormal or we need to change your treatment, we will call you to review the results.  Testing/Procedures: PUJFJM SLEEP STUDY  Follow-Up: At [facility], you and your health needs are our priority.  As part of our continuing mission to provide you with exceptional heart care, our providers are all part of one team.  This team includes your primary Cardiologist (physician) and Advanced Practice Providers or APPs (Physician Assistants and Nurse Practitioners) who all work together to provide you with the care you need, when you need it.  Your next appointment:   6 month(s)  Provider:   Joelle Cedars, MD    We recommend signing up for the patient portal called MyChart.  Sign up information is provided on this After Visit Summary.  MyChart is used to connect with patients for Virtual Visits (Telemedicine).  Patients are able to view lab/test results, encounter notes, upcoming appointments, etc.  Non-urgent messages can be sent to your provider as well.   To learn more about what you can do with MyChart, go to [email or web address].   Other Instructions

## 2024-04-30 NOTE — Telephone Encounter (Signed)
 Copied from CRM [id number]. Topic: Clinical - Medication Refill >> Apr 30, 2024 11:32 AM Macario HERO wrote: Medication: atorvastatin  (LIPITOR ) 40 MG tablet [[id number]]  Has the patient contacted their pharmacy? No (Agent: If no, request that the patient contact the pharmacy for the refill. If patient does not wish to contact the pharmacy document the reason why and proceed with request.) (Agent: If yes, when and what did the pharmacy advise?)  This is the patient's preferred pharmacy:   [facility] [address] [place], [place] - [address] [place] [address] Phone: [phone] Fax: [phone]  Is this the correct pharmacy for this prescription? Yes If no, delete pharmacy and type the correct one.   Has the prescription been filled recently? Yes  Is the patient out of the medication? Yes  Has the patient been seen for an appointment in the last year OR does the patient have an upcoming appointment? Yes  Can we respond through MyChart? No  Agent: Please be advised that Rx refills may take up to 3 business days. We ask that you follow-up with your pharmacy.

## 2024-05-07 MED ORDER — ATORVASTATIN CALCIUM 40 MG PO TABS: 40.0000 mg | ORAL_TABLET | Freq: Every day | ORAL | 0 refills | Status: AC

## 2024-05-15 ENCOUNTER — Encounter: Payer: Self-pay | Admitting: Internal Medicine

## 2024-05-15 NOTE — Progress Notes (Signed)
 Pharmacy Quality Measure Review  This patient is appearing on a report for being at risk of failing the adherence measure for cholesterol (statin) medications this calendar year.   Medication: Atorvastatin  40mg  Last fill date: 09/22 for 90 day supply  Insurance report was not up to date. No action needed at this time.   Angela Baalmann, PharmD [facility] Pharmacist

## 2024-05-24 ENCOUNTER — Telehealth: Payer: Self-pay

## 2024-05-24 ENCOUNTER — Ambulatory Visit

## 2024-05-24 VITALS — Ht 62.0 in | Wt 142.0 lb

## 2024-05-24 DIAGNOSIS — Z0000 Encounter for general adult medical examination without abnormal findings: Secondary | ICD-10-CM | POA: Diagnosis not present

## 2024-05-24 NOTE — Progress Notes (Signed)
 Subjective:   Sonia Skinner is a 73 y.o. who presents for a Medicare Wellness preventive visit.  As a reminder, Annual Wellness Visits don't include a physical exam, and some assessments may be limited, especially if this visit is performed virtually. We may recommend an in-person follow-up visit with your provider if needed.  Visit Complete: Virtual I connected with  Sonia Skinner on 05/24/24 by a audio enabled telemedicine application and verified that I am speaking with the correct person using two identifiers.  Patient Location: Home  Provider Location: Office/Clinic  I discussed the limitations of evaluation and management by telemedicine. The patient expressed understanding and agreed to proceed.  Vital Signs: Because this visit was a virtual/telehealth visit, some criteria may be missing or patient reported. Any vitals not documented were not able to be obtained and vitals that have been documented are patient reported.  VideoDeclined- This patient declined Librarian, academic. Therefore the visit was completed with audio only.  Persons Participating in Visit: Patient.  AWV Questionnaire: No: Patient Medicare AWV questionnaire was not completed prior to this visit.  Cardiac Risk Factors include: advanced age (>9men, >57 women);diabetes mellitus;dyslipidemia;hypertension;Other (see comment), Risk factor comments: CHF; CVA     Objective:    Today's Vitals   05/24/24 1315  Weight: 142 lb (64.4 kg)  Height: 5' 2 (1.575 m)   Body mass index is 25.97 kg/m.     05/24/2024    1:15 PM 05/18/2023   12:59 PM 02/16/2023    3:00 PM 01/18/2023    6:14 PM 09/03/2022   12:26 PM 08/12/2022    6:06 PM 02/18/2022    1:01 PM  Advanced Directives  Does Patient Have a Medical Advance Directive? No No No No No No No  Would patient like information on creating a medical advance directive? No - Patient declined No - Patient declined No - Patient declined No - Patient  declined  No - Patient declined     Current Medications (verified) Outpatient Encounter Medications as of 05/24/2024  Medication Sig   Accu-Chek FastClix Lancets MISC Use to check blood sugar five times daily.   ACCU-CHEK GUIDE test strip Use to check blood sugar twice a day. E11.9   amLODipine  (NORVASC ) 5 MG tablet Take 1 tablet (5 mg total) by mouth daily.   aspirin  81 MG chewable tablet Chew 1 tablet (81 mg total) by mouth daily.   atorvastatin  (LIPITOR ) 40 MG tablet Take 1 tablet (40 mg total) by mouth daily.   benztropine  (COGENTIN ) 0.5 MG tablet Take 1 tablet (0.5 mg total) by mouth at bedtime.   Blood Pressure Monitoring (OMRON 3 SERIES BP MONITOR) DEVI Use daily as directed to monitor blood pressure   brexpiprazole  (REXULTI ) 1 MG TABS tablet Take 1 tablet (1 mg total) by mouth at bedtime.   buPROPion  (WELLBUTRIN  XL) 300 MG 24 hr tablet Take 1 tablet (300 mg total) by mouth daily.   Continuous Blood Gluc Receiver (DEXCOM G7 RECEIVER) DEVI 1 Act by Does not apply route daily.   Continuous Glucose Sensor (DEXCOM G7 SENSOR) MISC 1 Act by Does not apply route daily.   Dexlansoprazole  30 MG capsule DR Take 1 capsule by mouth once daily   ezetimibe  (ZETIA ) 10 MG tablet Take 1 tablet by mouth once daily.   gabapentin  (NEURONTIN ) 100 MG capsule Take 1 capsule (100 mg total) by mouth 3 (three) times daily.   glucose blood (ACCU-CHEK GUIDE TEST) test strip 1 each by Other route  as needed for other. Use as instructed   glucose blood (ACCU-CHEK GUIDE TEST) test strip Use to check blood sugar twice a day. E11.9   HYDROcodone -acetaminophen  (NORCO) 5-325 MG tablet Take 1 tablet by mouth every 6 (six) hours as needed for moderate pain (pain score 4-6).   insulin  glargine, 1 Unit Dial , (TOUJEO  SOLOSTAR) 300 UNIT/ML Solostar Pen Inject 28-30 Units into the skin daily.   insulin  lispro (HUMALOG  KWIKPEN) 200 UNIT/ML KwikPen Inject 8-10 Units into the skin 2 (two) times daily before a meal.   Insulin  Pen  Needle 32G X 4 MM MISC Use 3x a day   levothyroxine  (SYNTHROID ) 88 MCG tablet Take 1 tablet by mouth once daily   lubiprostone  (AMITIZA ) 24 MCG capsule Take 1 capsule (24 mcg total) by mouth 2 (two) times daily with a meal. (Patient taking differently: Take 24 mcg by mouth as needed for constipation.)   Semaglutide , 1 MG/DOSE, (OZEMPIC , 1 MG/DOSE,) 4 MG/3ML SOPN Inject 1 mg into the skin once a week.   terazosin  (HYTRIN ) 2 MG capsule Take 1 capsule (2 mg total) by mouth at bedtime.   torsemide  (DEMADEX ) 20 MG tablet Take 1 tablet (20 mg total) by mouth daily.   traZODone  (DESYREL ) 50 MG tablet Take 1 tablet (50 mg total) by mouth at bedtime as needed for sleep.   valsartan  (DIOVAN ) 80 MG tablet Take 80 mg by mouth daily.   No facility-administered encounter medications on file as of 05/24/2024.    Allergies (verified) Anesthetics, ester   History: Past Medical History:  Diagnosis Date   Ankle fracture    Left   Anxiety    Carotid artery occlusion    Closed fracture of left distal fibula 09/16/2017   Complication of anesthesia    ALLERY TO ESTER BASE   Depression    early 30s   Diabetes mellitus    INSULIN  DEPENDENT   GERD (gastroesophageal reflux disease)    Headache    years ago   Hypertension    Pneumonia    Stroke [facility]) March 2015    left MCA infarct, slight weakness on left side   Thyroid  disease    Past Surgical History:  Procedure Laterality Date   ABDOMINAL HYSTERECTOMY     ANTERIOR FIXATION AND POSTERIOR MICRODISCECTOMY CERVICAL SPINE  1999   CAROTID ENDARTERECTOMY Left 11-04-13   cea   ENDARTERECTOMY Left 11/04/2013   IR ANGIO INTRA EXTRACRAN SEL COM CAROTID INNOMINATE BILAT MOD SED  11/16/2021   IR ANGIO INTRA EXTRACRAN SEL COM CAROTID INNOMINATE UNI L MOD SED  11/19/2021   IR ANGIO VERTEBRAL SEL VERTEBRAL BILAT MOD SED  11/16/2021   IR CT HEAD LTD  11/19/2021   IR INTRAVSC STENT CERV CAROTID W/EMB-PROT MOD SED INCL ANGIO  11/19/2021   IR RADIOLOGIST EVAL & MGMT   12/13/2021   ORIF ANKLE FRACTURE Left 09/16/2017   Procedure: OPEN REDUCTION INTERNAL FIXATION (ORIF) LEFT ANKLE FRACTURE;  Surgeon: Josefina Chew, MD;  Location: [facility] OR;  Service: Orthopedics;  Laterality: Left;   RADIOLOGY WITH ANESTHESIA N/A 11/19/2021   Procedure: Left carotid angioplasty with possible stenting;  Surgeon: Dolphus Carrion, MD;  Location: [facility] OR;  Service: Radiology;  Laterality: N/A;   Family History  Problem Relation Age of Onset   Diabetes Mother    Heart disease Mother        Before age 70   Cancer Father        Lung   Hypertension Sister    Diabetes Sister  Diabetes Sister    Diabetes Sister    Social History   Socioeconomic History   Marital status: Legally Separated    Spouse name: Not on file   Number of children: 0   Years of education: College   Highest education level: Not on file  Occupational History   Occupation: [employer]    Employer: [employer]  Tobacco Use   Smoking status: Former    Current packs/day: 0.00    Average packs/day: 1 pack/day for 20.0 years (20.0 ttl pk-yrs)    Types: Cigarettes    Start date: 11/01/1993    Quit date: 11/01/2013    Years since quitting: 10.5   Smokeless tobacco: Never  Vaping Use   Vaping status: Never Used  Substance and Sexual Activity   Alcohol use: Not Currently   Drug use: No   Sexual activity: Not Currently  Other Topics Concern   Not on file  Social History Narrative   Patient lives in 1 story home with sister.   Caffeine Use: 2 cups daily; sodas occasionally   Some college education   Works as Lawyer for [employer]   Social Drivers of Health   Financial Resource Strain: Low Risk  (05/24/2024)   Overall Financial Resource Strain (CARDIA)    Difficulty of Paying Living Expenses: Not hard at all  Food Insecurity: No Food Insecurity (05/24/2024)   Hunger Vital Sign    Worried About Running Out of Food in the Last Year: Never true    Ran Out of Food in the Last Year: Never true   Transportation Needs: No Transportation Needs (05/24/2024)   PRAPARE - Administrator, Civil Service (Medical): No    Lack of Transportation (Non-Medical): No  Physical Activity: Inactive (05/24/2024)   Exercise Vital Sign    Days of Exercise per Week: 0 days    Minutes of Exercise per Session: 0 min  Stress: No Stress Concern Present (05/24/2024)   [employer] of Occupational Health - Occupational Stress Questionnaire    Feeling of Stress: Not at all  Social Connections: Moderately Integrated (05/24/2024)   Social Connection and Isolation Panel    Frequency of Communication with Friends and Family: More than three times a week    Frequency of Social Gatherings with Friends and Family: More than three times a week    Attends Religious Services: More than 4 times per year    Active Member of [employer] or Organizations: Yes    Attends Engineer, structural: More than 4 times per year    Marital Status: Separated    Tobacco Counseling Counseling given: Not Answered    Clinical Intake:  Pre-visit preparation completed: Yes  Pain : No/denies pain     BMI - recorded: 25.97 Nutritional Status: BMI 25 -29 Overweight Nutritional Risks: None Diabetes: Yes CBG done?: No Did pt. bring in CBG monitor from home?: No  Lab Results  Component Value Date   HGBA1C 7.7 (H) 01/26/2024   HGBA1C 11.0 (A) 09/06/2023   HGBA1C 9.2 (H) 05/03/2023     How often do you need to have someone help you when you read instructions, pamphlets, or other written materials from your doctor or pharmacy?: 1 - Never  Interpreter Needed?: No  Information entered by :: Verdie Saba, CMA   Activities of Daily Living     05/24/2024    1:18 PM  In your present state of health, do you have any difficulty performing the following activities:  Hearing?  0  Vision? 0  Difficulty concentrating or making decisions? 0  Walking or climbing stairs? 0  Dressing or bathing? 0  Doing errands,  shopping? 0  Preparing Food and eating ? N  Using the Toilet? N  In the past six months, have you accidently leaked urine? N  Do you have problems with loss of bowel control? N  Managing your Medications? N  Managing your Finances? N  Housekeeping or managing your Housekeeping? N    Patient Care Team: Joshua Debby CROME, MD as PCP - General (Internal Medicine) Curry Leni DASEN, MD as Consulting Physician (Psychiatry) Regenia Prentice Clack, MD as Consulting Physician (Ophthalmology) Hobart Powell BRAVO, MD (Inactive) as Consulting Physician (Cardiology)  I have updated your Care Teams any recent Medical Services you may have received from other providers in the past year.     Assessment:   This is a routine wellness examination for [facility].  Hearing/Vision screen Hearing Screening - Comments:: Denies hearing difficulties   Vision Screening - Comments:: Wears rx glasses - plans to schedule an appt w/an Optometrist by end of 07/2024    Goals Addressed               This Visit's Progress     Patient Stated (pt-stated)        Patient stated she plans to continue managing her discomfort in back       Depression Screen     05/24/2024    1:19 PM 01/26/2024    2:45 PM 05/18/2023   12:55 PM 01/27/2023   10:58 AM 03/29/2022   10:08 AM 02/25/2022    1:05 PM 01/28/2022   11:36 AM  PHQ 2/9 Scores  PHQ - 2 Score 0 0 1 2 2 2  0  PHQ- 9 Score 0 0 3 9 14 9  0    Fall Risk     05/24/2024    1:19 PM 01/26/2024    2:45 PM 05/18/2023   12:54 PM 03/29/2022   10:08 AM 02/25/2022    1:05 PM  Fall Risk   Falls in the past year? 0 0 0 1 1  Number falls in past yr: 0 0 0 0 0  Injury with Fall? 0 0 0 0 1  Risk for fall due to : No Fall Risks No Fall Risks No Fall Risks Other (Comment)   Follow up Falls evaluation completed;Falls prevention discussed Falls evaluation completed Falls prevention discussed;Falls evaluation completed Falls evaluation completed       Data saved with a previous flowsheet  row definition    MEDICARE RISK AT HOME:  Medicare Risk at Home Any stairs in or around the home?: No If so, are there any without handrails?: No Home free of loose throw rugs in walkways, pet beds, electrical cords, etc?: Yes Adequate lighting in your home to reduce risk of falls?: Yes Life alert?: No Use of a cane, walker or w/c?: No Grab bars in the bathroom?: Yes Shower chair or bench in shower?: No Elevated toilet seat or a handicapped toilet?: No  TIMED UP AND GO:  Was the test performed?  No  Cognitive Function: 6CIT completed    03/03/2018    1:00 PM  MMSE - Mini Mental State Exam  Orientation to time 5  Orientation to Place 5  Registration 3  Attention/ Calculation 5  Recall 1  Language- name 2 objects 2  Language- repeat 1  Language- follow 3 step command 3  Language- read & follow direction 1  Write a sentence 1  Copy design 1  Total score 28        05/24/2024    1:22 PM 05/18/2023   12:59 PM 01/28/2022   11:40 AM  6CIT Screen  What Year? 0 points 0 points 0 points  What month? 0 points 0 points 0 points  What time? 0 points 0 points 0 points  Count back from 20 0 points 0 points 0 points  Months in reverse 0 points 0 points 0 points  Repeat phrase 0 points 2 points 0 points  Total Score 0 points 2 points 0 points    Immunizations Immunization History  Administered Date(s) Administered   Fluad Quad(high Dose 65+) 04/06/2019   INFLUENZA, HIGH DOSE SEASONAL PF 05/25/2018, 05/25/2022   Influenza Split 05/17/2013   Influenza,inj,Quad PF,6+ Mos 05/08/2014, 05/13/2015, 06/29/2017, 04/24/2020   Influenza-Unspecified 07/27/2021   PFIZER(Purple Top)SARS-COV-2 Vaccination 09/22/2019, 10/13/2019, 05/18/2020, 02/05/2021   Pneumococcal Conjugate-13 12/20/2016   Pneumococcal Polysaccharide-23 11/02/2013, 04/06/2019   Tdap 11/24/2016    Screening Tests Health Maintenance  Topic Date Due   Zoster Vaccines- Shingrix  (1 of 2) Never done   Influenza Vaccine   03/16/2024   COVID-19 Vaccine (5 - 2025-26 season) 04/16/2024   OPHTHALMOLOGY EXAM  04/19/2024   HEMOGLOBIN A1C  07/27/2024   FOOT EXAM  01/25/2025   Colonoscopy  11/24/2025   Mammogram  02/27/2026   DTaP/Tdap/Td (2 - Td or Tdap) 11/25/2026   Pneumococcal Vaccine: 50+ Years  Completed   DEXA SCAN  Completed   Hepatitis C Screening  Completed   Meningococcal B Vaccine  Aged Out    Health Maintenance Items Addressed: 05/24/2024  Additional Screening:  Vision Screening: Recommended annual ophthalmology exams for early detection of glaucoma and other disorders of the eye. Is the patient up to date with their annual eye exam?  No  Who is the provider or what is the name of the office in which the patient attends annual eye exams? Pt is unable to recall the Optometrist's name  Dental Screening: Recommended annual dental exams for proper oral hygiene  Community Resource Referral / Chronic Care Management: CRR required this visit?  No   CCM required this visit?  No   Plan:    I have personally reviewed and noted the following in the patient's chart:   Medical and social history Use of alcohol, tobacco or illicit drugs  Current medications and supplements including opioid prescriptions. Patient is currently taking opioid prescriptions. Information provided to patient regarding non-opioid alternatives. Patient advised to discuss non-opioid treatment plan with their provider. Functional ability and status Nutritional status Physical activity Advanced directives List of other physicians Hospitalizations, surgeries, and ER visits in previous 12 months Vitals Screenings to include cognitive, depression, and falls Referrals and appointments  In addition, I have reviewed and discussed with patient certain preventive protocols, quality metrics, and best practice recommendations. A written personalized care plan for preventive services as well as general preventive health recommendations  were provided to patient.   Verdie CHRISTELLA Saba, CMA   05/24/2024   After Visit Summary: (MyChart) Due to this being a telephonic visit, the after visit summary with patients personalized plan was offered to patient via MyChart   Notes: Nothing significant to report at this time.

## 2024-05-24 NOTE — Telephone Encounter (Signed)
 Patient is requesting Joshua' nurse to call regarding Rx refills

## 2024-05-24 NOTE — Patient Instructions (Addendum)
 [name],  Thank you for taking the time for your Medicare Wellness Visit. I appreciate your continued commitment to your health goals. Please review the care plan we discussed, and feel free to reach out if I can assist you further.  Medicare recommends these wellness visits once per year to help you and your care team stay ahead of potential health issues. These visits are designed to focus on prevention, allowing your provider to concentrate on managing your acute and chronic conditions during your regular appointments.  Please note that Annual Wellness Visits do not include a physical exam. Some assessments may be limited, especially if the visit was conducted virtually. If needed, we may recommend a separate in-person follow-up with your provider.  Ongoing Care Seeing your primary care provider every 3 to 6 months helps us  monitor your health and provide consistent, personalized care.   Referrals If a referral was made during today's visit and you haven't received any updates within two weeks, please contact the referred provider directly to check on the status.  Recommended Screenings:  Health Maintenance  Topic Date Due   Zoster (Shingles) Vaccine (1 of 2) Never done   Flu Shot  03/16/2024   COVID-19 Vaccine (5 - 2025-26 season) 04/16/2024   Eye exam for diabetics  04/19/2024   Hemoglobin A1C  07/27/2024   Complete foot exam   01/25/2025   Colon Cancer Screening  11/24/2025   Breast Cancer Screening  02/27/2026   DTaP/Tdap/Td vaccine (2 - Td or Tdap) 11/25/2026   Pneumococcal Vaccine for age over 25  Completed   DEXA scan (bone density measurement)  Completed   Hepatitis C Screening  Completed   Meningitis B Vaccine  Aged Out       05/24/2024    1:15 PM  Advanced Directives  Does Patient Have a Medical Advance Directive? No  Would patient like information on creating a medical advance directive? No - Patient declined   Advance Care Planning is important because  it: Ensures you receive medical care that aligns with your values, goals, and preferences. Provides guidance to your family and loved ones, reducing the emotional burden of decision-making during critical moments.  Vision: Annual vision screenings are recommended for early detection of glaucoma, cataracts, and diabetic retinopathy. These exams can also reveal signs of chronic conditions such as diabetes and high blood pressure.  Dental: Annual dental screenings help detect early signs of oral cancer, gum disease, and other conditions linked to overall health, including heart disease and diabetes.

## 2024-05-25 ENCOUNTER — Other Ambulatory Visit: Payer: Self-pay

## 2024-05-25 DIAGNOSIS — Z515 Encounter for palliative care: Secondary | ICD-10-CM

## 2024-05-25 DIAGNOSIS — G8929 Other chronic pain: Secondary | ICD-10-CM

## 2024-05-25 MED ORDER — HYDROCODONE-ACETAMINOPHEN 5-325 MG PO TABS: 1.0000 | ORAL_TABLET | Freq: Four times a day (QID) | ORAL | 0 refills | Status: DC | PRN

## 2024-05-25 NOTE — Telephone Encounter (Signed)
 Medication refill request has been sent to Dr. Yetta Barre

## 2024-05-28 ENCOUNTER — Telehealth (HOSPITAL_COMMUNITY): Admitting: Psychiatry

## 2024-06-04 ENCOUNTER — Other Ambulatory Visit: Payer: Self-pay

## 2024-06-04 ENCOUNTER — Telehealth: Payer: Self-pay

## 2024-06-04 DIAGNOSIS — G8929 Other chronic pain: Secondary | ICD-10-CM

## 2024-06-04 DIAGNOSIS — Z515 Encounter for palliative care: Secondary | ICD-10-CM

## 2024-06-04 MED ORDER — HYDROCODONE-ACETAMINOPHEN 5-325 MG PO TABS: 1.0000 | ORAL_TABLET | Freq: Four times a day (QID) | ORAL | 0 refills | Status: AC | PRN

## 2024-06-04 NOTE — Telephone Encounter (Signed)
 Copied from CRM #[id number]. Topic: Clinical - Prescription Issue >> Jun 04, 2024  9:37 AM Sonia Skinner wrote: Reason for CRM: Pt stated that she contacted the pharmacy and they stated that they don't have a refill request for the HYDROcodone -acetaminophen  (NORCO) 5-325 MG tablet. I informed the pt that the request was approved and sent to [email or web address]. Pt stated that the request should have been sent to the [facility] pharmacy on [address]. PT would like to have the request resent to the correct pharmacy and would like a callback with an update.

## 2024-06-04 NOTE — Telephone Encounter (Signed)
 Medication refill request was sent to Dr. Joshua

## 2024-06-06 ENCOUNTER — Telehealth: Payer: Self-pay

## 2024-06-06 ENCOUNTER — Other Ambulatory Visit (HOSPITAL_COMMUNITY): Payer: Self-pay

## 2024-06-06 NOTE — Telephone Encounter (Signed)
 Clinical questions answered and PA submitted.

## 2024-06-06 NOTE — Telephone Encounter (Signed)
 Pharmacy Patient Advocate Encounter   Received notification from CoverMyMeds that prior authorization for Hydrocodone /APAP 5/325mg  tabs is required/requested.   Insurance verification completed.   The patient is insured through [facility].   Per test claim: PA required; PA started via CoverMyMeds. KEY [id number] . Waiting for clinical questions to populate.

## 2024-06-07 ENCOUNTER — Other Ambulatory Visit: Payer: Self-pay | Admitting: Internal Medicine

## 2024-06-07 ENCOUNTER — Other Ambulatory Visit (HOSPITAL_COMMUNITY): Payer: Self-pay

## 2024-06-07 DIAGNOSIS — I5032 Chronic diastolic (congestive) heart failure: Secondary | ICD-10-CM

## 2024-06-07 DIAGNOSIS — I10 Essential (primary) hypertension: Secondary | ICD-10-CM

## 2024-06-07 DIAGNOSIS — N184 Chronic kidney disease, stage 4 (severe): Secondary | ICD-10-CM

## 2024-06-07 NOTE — Telephone Encounter (Signed)
 Pharmacy Patient Advocate Encounter  Received notification from [employer] that Prior Authorization for Hydrocodone /APAP 5/325mg  has been APPROVED from 06/06/24 to 08/15/25. Ran test claim, Copay is $0. This test claim was processed through [facility]- copay amounts may vary at other pharmacies due to pharmacy/plan contracts, or as the patient moves through the different stages of their insurance plan.   PA #/Case ID/Reference #: [id number]  Left a message at [facility] to notify of the approval

## 2024-06-09 ENCOUNTER — Other Ambulatory Visit: Payer: Self-pay | Admitting: Internal Medicine

## 2024-06-09 ENCOUNTER — Other Ambulatory Visit (HOSPITAL_COMMUNITY): Payer: Self-pay | Admitting: Psychiatry

## 2024-06-09 DIAGNOSIS — E785 Hyperlipidemia, unspecified: Secondary | ICD-10-CM

## 2024-06-09 DIAGNOSIS — F411 Generalized anxiety disorder: Secondary | ICD-10-CM

## 2024-06-09 DIAGNOSIS — F331 Major depressive disorder, recurrent, moderate: Secondary | ICD-10-CM

## 2024-06-09 DIAGNOSIS — R251 Tremor, unspecified: Secondary | ICD-10-CM

## 2024-06-14 ENCOUNTER — Other Ambulatory Visit: Payer: Self-pay | Admitting: Internal Medicine

## 2024-06-14 DIAGNOSIS — N184 Chronic kidney disease, stage 4 (severe): Secondary | ICD-10-CM

## 2024-06-14 DIAGNOSIS — I10 Essential (primary) hypertension: Secondary | ICD-10-CM

## 2024-06-14 DIAGNOSIS — I5032 Chronic diastolic (congestive) heart failure: Secondary | ICD-10-CM

## 2024-06-14 MED ORDER — TORSEMIDE 20 MG PO TABS: 20.0000 mg | ORAL_TABLET | Freq: Every day | ORAL | 0 refills | Status: DC

## 2024-06-14 MED ORDER — TORSEMIDE 20 MG PO TABS: 20.0000 mg | ORAL_TABLET | Freq: Every day | ORAL | 0 refills | Status: AC

## 2024-06-15 NOTE — Telephone Encounter (Signed)
 Patient has been made aware.

## 2024-06-16 ENCOUNTER — Other Ambulatory Visit: Payer: Self-pay | Admitting: Internal Medicine

## 2024-06-16 DIAGNOSIS — N184 Chronic kidney disease, stage 4 (severe): Secondary | ICD-10-CM

## 2024-06-16 DIAGNOSIS — I5032 Chronic diastolic (congestive) heart failure: Secondary | ICD-10-CM

## 2024-06-16 DIAGNOSIS — I10 Essential (primary) hypertension: Secondary | ICD-10-CM

## 2024-06-21 ENCOUNTER — Ambulatory Visit: Admitting: Internal Medicine

## 2024-06-21 ENCOUNTER — Encounter: Payer: Self-pay | Admitting: Internal Medicine

## 2024-06-21 VITALS — BP 120/70 | HR 106 | Ht 62.0 in | Wt 145.6 lb

## 2024-06-21 DIAGNOSIS — E1165 Type 2 diabetes mellitus with hyperglycemia: Secondary | ICD-10-CM | POA: Diagnosis not present

## 2024-06-21 DIAGNOSIS — E119 Type 2 diabetes mellitus without complications: Secondary | ICD-10-CM

## 2024-06-21 DIAGNOSIS — E042 Nontoxic multinodular goiter: Secondary | ICD-10-CM

## 2024-06-21 DIAGNOSIS — E039 Hypothyroidism, unspecified: Secondary | ICD-10-CM | POA: Diagnosis not present

## 2024-06-21 DIAGNOSIS — E1159 Type 2 diabetes mellitus with other circulatory complications: Secondary | ICD-10-CM | POA: Diagnosis not present

## 2024-06-21 DIAGNOSIS — E118 Type 2 diabetes mellitus with unspecified complications: Secondary | ICD-10-CM

## 2024-06-21 DIAGNOSIS — Z794 Long term (current) use of insulin: Secondary | ICD-10-CM

## 2024-06-21 LAB — POCT GLYCOSYLATED HEMOGLOBIN (HGB A1C): Hemoglobin A1C: 9.3 % — AB (ref 4.0–5.6)

## 2024-06-21 MED ORDER — LEVOTHYROXINE SODIUM 88 MCG PO TABS: 88.0000 ug | ORAL_TABLET | Freq: Every day | ORAL | 3 refills | Status: AC

## 2024-06-21 MED ORDER — SEMAGLUTIDE (1 MG/DOSE) 4 MG/3ML ~~LOC~~ SOPN: 1.0000 mg | PEN_INJECTOR | SUBCUTANEOUS | 3 refills | Status: DC

## 2024-06-21 MED ORDER — TOUJEO SOLOSTAR 300 UNIT/ML ~~LOC~~ SOPN: 24.0000 [IU] | PEN_INJECTOR | Freq: Every day | SUBCUTANEOUS | Status: AC

## 2024-06-21 MED ORDER — HUMALOG KWIKPEN 200 UNIT/ML ~~LOC~~ SOPN: 8.0000 [IU] | PEN_INJECTOR | Freq: Two times a day (BID) | SUBCUTANEOUS | Status: DC

## 2024-06-21 NOTE — Patient Instructions (Addendum)
 Please increase: - Ozempic  1 mg weekly  Change: - Toujeo  24 units daily in am - Humalog  15 min before meals: - 12 units before B'fast - 10 units before lunch - 8 units before dinner If you have to take the Humalog  after a meal, take only half of the amount.  Please continue levothyroxine  88 mcg daily.  Take the thyroid  hormone every day, with water, at least 30 minutes before breakfast, separated by at least 4 hours from: - acid reflux medications - calcium  - iron - multivitamins  Please return in 2-3 months.

## 2024-06-21 NOTE — Addendum Note (Signed)
 Addended by: CLEOTILDE ROLIN RAMAN on: 06/21/2024 02:14 PM   Modules accepted: Orders

## 2024-06-21 NOTE — Progress Notes (Signed)
 Patient ID: Sonia Skinner, female   DOB: [birth year], 73 y.o.   MRN: [id number]  HPI: Sonia Skinner is a 73 y.o.-year-old female, returning for follow-up for DM2, dx in 1962, insulin -dependent since 2000, uncontrolled, with complications (CVA, left carotid stenosis, PAD, CKD stage IV, PN, history of hypoglycemia, history of nonketotic hyperglycemic state). Pt. previously saw Dr. Kassie, but last visit with me 5 months ago.  Interim history: No increased urination, no nausea, chest pain.  She has fatigue and blurry vision, which are not new.  Reviewed HbA1c: Lab Results  Component Value Date   HGBA1C 7.7 (H) 01/26/2024   HGBA1C 11.0 (A) 09/06/2023   HGBA1C 9.2 (H) 05/03/2023   HGBA1C 8.1 (A) 12/31/2022   HGBA1C 8.9 (H) 03/29/2022   HGBA1C 8.1 (H) 02/26/2022   HGBA1C 7.8 (H) 11/14/2021   HGBA1C 8.0 (A) 11/02/2021   HGBA1C 8.5 (H) 08/15/2021   HGBA1C 7.2 (H) 04/07/2021   Previously on: - Farxiga  10 mg before breakfast - started 08/2021 >> stopped by her nephrologist, Dr. Dalene - Ozempic  2 mg weekly  >> stopped 01/2023 due to significantly decreased appetite I could not eat anything - Humalog  3-5 units 15 min before a meal with more carbs - Toujeo  30 >> 20-22 >> 26 units daily She was previously on Basaglar , then NPH.  Then on: - Toujeo  26 >> 30 >> recommended 40 units daily >> but takes 28 units in am - Humalog  5-7 units 15 min before meals >> takes it AFTER the meals >> moved before meals: 7-10 units 15 min before meals >> still takes 5-8 units before meals  - Ozempic  0.5 >> 0.25 >> 0.58-10 mg weekly in a.m.  I advised her to use the following regimen: - Ozempic  1 >> 0.5 mg weekly (too much weight loss and decreased appetite of the higher dose) - Toujeo  32 >> 28 >> actually taking 20 units daily in am - Humalog  5-8 >> 8-10 units 15 min before meals >>  - 12-15 units before B'fast - 8-10 units before lunch - 6-8 units before dinner But actually takes 10 units before each  meal!  Pt checks her sugars more than 4 times a day with her Dexcom CGM:  Previously:   Previously:  Previously:    Lowest sugar was 63 >> 40; she has hypoglycemia awareness at 70.  Highest sugar was 401 >> 400.  In 2017, she had nonketotic hyperglycemic state. She was admitted 01/2023 with AKI + hyperkalemia due to using both Ozempic  and Farxiga .  At that time it was also noted that she had nausea/vomiting/poor oral intake and presyncope while on Ozempic .  She was taken off both Ozempic  and Farxiga  at that time.  Glucometer: Accu-Chek guide  Meals: - coffee + sugar at 10 am - takes 6 units after coffee - lunch: fried chicken + yeast rolls, etc - dinner: meat + potatoes + collard greens  vanilla + chocolate.  - + CKD stage IV - sees Dr. Dalene (nephrology), last BUN/creatinine:  Lab Results  Component Value Date   BUN 19 01/26/2024   BUN 25 (A) 04/19/2023   CREATININE 1.68 (H) 01/26/2024   CREATININE 2.2 (A) 04/19/2023   Lab Results  Component Value Date   MICRALBCREAT 2 04/20/2023   MICRALBCREAT 3 11/02/2022   MICRALBCREAT 53 11/02/2022   MICRALBCREAT 7 04/01/2020  Not on ACE inhibitor/ARB.  -+ HL; last set of lipids: Lab Results  Component Value Date   CHOL 170 01/26/2024   HDL  51.50 01/26/2024   LDLCALC 88 01/26/2024   LDLDIRECT 141 (H) 03/04/2010   TRIG 153.0 (H) 01/26/2024   CHOLHDL 3 01/26/2024  On Lipitor  40 mg daily. On Zetia  10 mg daily.  - last eye exam was 04/2023. No DR.   - no numbness and tingling in her feet.  Foot exam 01/26/2024.  She is on gabapentin  100 mg 3 times a day.  She has a history of multinodular goiter, diagnosed in 2017.  Latest thyroid  ultrasound (11/09/2021) reviewed: Parenchymal Echotexture: Moderately heterogenous  Isthmus: Normal in size measuring 0.3 cm in diameter, unchanged  Right lobe: Normal in size measuring 5.0 x 1.8 x 1.0 cm, previously, 4.9 x 1.7 x 1.4 cm  Left lobe: Normal in size measuring 4.8 x 1.6 x 1.6 cm,  previously, 5.3 x 2.0 x 1.9 cm _________________________________________________________   The approximately 1.2 x 1.0 x 0.9 cm isoechoic ill-defined nodule within the mid aspect the right lobe of the thyroid  (labeled 1), is grossly unchanged compared to the 05/2016 examination, previously, 1.2 x 1.1 x 0.9 cm and is again noted to contain an internal partially shadowing macrocalcification. Imaging stability for greater than 5 years is indicative of a benign etiology.   The approximately 1.0 x 1.0 x 0.5 cm spongiform/benign-appearing nodule within the inferior, medial aspect of the right lobe of the thyroid  (labeled 2), is grossly unchanged compared to the 05/2016 examination, previously, 1.0 cm, and again does not meet criteria to recommend percutaneous sampling or continued dedicated follow-up.  _________________________________________________________   The approximately 2.5 x 1.5 x 1.3 cm isoechoic nodule within mid aspect the left lobe of the thyroid  (labeled 3), is unchanged compared to the 05/2016 examination, previously, 2.5 x 1.8 x 1.3 cm and is again noted to contain an internal partially shadowing macrocalcification. Imaging stability for greater than 5 years is indicative of a benign etiology.   IMPRESSION: 1. Similar findings of multinodular goiter. No worrisome new or enlarging thyroid  nodules. 2. All discretely thyroid  nodules are unchanged since the 05/2016 examination. By report, dominant bilateral thyroid  nodules may have been biopsied at an outside institution. If so, correlation with previous biopsy results is advised. Otherwise, imaging stability for greater than 5 years is indicative of a benign etiology and as such percutaneous sampling and/or continued dedicated follow-up is not recommended.  Pt denies: - feeling nodules in neck - hoarseness - dysphagia - choking  Hypothyroidism:  She is on Synthroid  88 mcg daily: - in am - fasting - right before  b'fast >> moved 30 minutes before breakfast - no calcium  - no iron - no multivitamins - + PPIs  - Dexilant  in am 30 min to 1h >> moved later in the day - not on Biotin  Reviewed most recent TFTs: Lab Results  Component Value Date   TSH 3.67 01/26/2024   TSH 1.09 05/03/2023   TSH 0.368 01/19/2023   TSH 4.23 03/29/2022   TSH 3.75 02/25/2022   TSH 1.088 11/14/2021   TSH 11.69 (H) 11/02/2021   TSH 0.25 (L) 08/27/2021   TSH 0.053 (L) 08/15/2021   TSH 33.15 (H) 04/07/2021   She is not on steroids or biotin.  She also has a history of GERD, headache, HTN, depression. She was admitted 08/12/2022 with SBO.  ROS: + see HPI Past Medical History:  Diagnosis Date   Ankle fracture    Left   Anxiety    Carotid artery occlusion    Closed fracture of left distal fibula 09/16/2017   Complication  of anesthesia    ALLERY TO ESTER BASE   Depression    early 30s   Diabetes mellitus    INSULIN  DEPENDENT   GERD (gastroesophageal reflux disease)    Headache    years ago   Hypertension    Pneumonia    Stroke [facility]) March 2015    left MCA infarct, slight weakness on left side   Thyroid  disease    Past Surgical History:  Procedure Laterality Date   ABDOMINAL HYSTERECTOMY     ANTERIOR FIXATION AND POSTERIOR MICRODISCECTOMY CERVICAL SPINE  1999   CAROTID ENDARTERECTOMY Left 11-04-13   cea   ENDARTERECTOMY Left 11/04/2013   IR ANGIO INTRA EXTRACRAN SEL COM CAROTID INNOMINATE BILAT MOD SED  11/16/2021   IR ANGIO INTRA EXTRACRAN SEL COM CAROTID INNOMINATE UNI L MOD SED  11/19/2021   IR ANGIO VERTEBRAL SEL VERTEBRAL BILAT MOD SED  11/16/2021   IR CT HEAD LTD  11/19/2021   IR INTRAVSC STENT CERV CAROTID W/EMB-PROT MOD SED INCL ANGIO  11/19/2021   IR RADIOLOGIST EVAL & MGMT  12/13/2021   ORIF ANKLE FRACTURE Left 09/16/2017   Procedure: OPEN REDUCTION INTERNAL FIXATION (ORIF) LEFT ANKLE FRACTURE;  Surgeon: Josefina Chew, MD;  Location: [facility] OR;  Service: Orthopedics;  Laterality: Left;   RADIOLOGY WITH  ANESTHESIA N/A 11/19/2021   Procedure: Left carotid angioplasty with possible stenting;  Surgeon: Dolphus Carrion, MD;  Location: [facility] OR;  Service: Radiology;  Laterality: N/A;   Social History   Socioeconomic History   Marital status: Legally Separated    Spouse name: Not on file   Number of children: 0   Years of education: College   Highest education level: Not on file  Occupational History   Occupation: [employer]    Employer: [employer]  Tobacco Use   Smoking status: Former    Current packs/day: 0.00    Average packs/day: 1 pack/day for 20.0 years (20.0 ttl pk-yrs)    Types: Cigarettes    Start date: 11/01/1993    Quit date: 11/01/2013    Years since quitting: 10.6   Smokeless tobacco: Never  Vaping Use   Vaping status: Never Used  Substance and Sexual Activity   Alcohol use: Not Currently   Drug use: No   Sexual activity: Not Currently  Other Topics Concern   Not on file  Social History Narrative   Patient lives in 1 story home with sister.   Caffeine Use: 2 cups daily; sodas occasionally   Some college education   Works as lawyer for [employer]   Social Drivers of Health   Financial Resource Strain: Low Risk  (05/24/2024)   Overall Financial Resource Strain (CARDIA)    Difficulty of Paying Living Expenses: Not hard at all  Food Insecurity: No Food Insecurity (05/24/2024)   Hunger Vital Sign    Worried About Running Out of Food in the Last Year: Never true    Ran Out of Food in the Last Year: Never true  Transportation Needs: No Transportation Needs (05/24/2024)   PRAPARE - Administrator, Civil Service (Medical): No    Lack of Transportation (Non-Medical): No  Physical Activity: Inactive (05/24/2024)   Exercise Vital Sign    Days of Exercise per Week: 0 days    Minutes of Exercise per Session: 0 min  Stress: No Stress Concern Present (05/24/2024)   [employer] of Occupational Health - Occupational Stress Questionnaire    Feeling of Stress:  Not at all  Social Connections:  Moderately Integrated (05/24/2024)   Social Connection and Isolation Panel    Frequency of Communication with Friends and Family: More than three times a week    Frequency of Social Gatherings with Friends and Family: More than three times a week    Attends Religious Services: More than 4 times per year    Active Member of [employer] or Organizations: Yes    Attends Banker Meetings: More than 4 times per year    Marital Status: Separated  Intimate Partner Violence: Not At Risk (05/24/2024)   Humiliation, Afraid, Rape, and Kick questionnaire    Fear of Current or Ex-Partner: No    Emotionally Abused: No    Physically Abused: No    Sexually Abused: No   Current Outpatient Medications on File Prior to Visit  Medication Sig Dispense Refill   Accu-Chek FastClix Lancets MISC Use to check blood sugar five times daily. 510 each 2   ACCU-CHEK GUIDE test strip Use to check blood sugar twice a day. E11.9 100 each 2   amLODipine  (NORVASC ) 5 MG tablet Take 1 tablet (5 mg total) by mouth daily. 90 tablet 0   aspirin  81 MG chewable tablet Chew 1 tablet (81 mg total) by mouth daily. 30 tablet 1   atorvastatin  (LIPITOR ) 40 MG tablet Take 1 tablet (40 mg total) by mouth daily. 90 tablet 0   benztropine  (COGENTIN ) 0.5 MG tablet Take 1 tablet (0.5 mg total) by mouth at bedtime. 90 tablet 0   Blood Pressure Monitoring (OMRON 3 SERIES BP MONITOR) DEVI Use daily as directed to monitor blood pressure 1 each 0   brexpiprazole  (REXULTI ) 1 MG TABS tablet Take 1 tablet (1 mg total) by mouth at bedtime. 90 tablet 0   buPROPion  (WELLBUTRIN  XL) 300 MG 24 hr tablet Take 1 tablet (300 mg total) by mouth daily. 90 tablet 0   Continuous Blood Gluc Receiver (DEXCOM G7 RECEIVER) DEVI 1 Act by Does not apply route daily. 2 each 5   Continuous Glucose Sensor (DEXCOM G7 SENSOR) MISC 1 Act by Does not apply route daily. 6 each 3   Dexlansoprazole  30 MG capsule DR Take 1 capsule by mouth  once daily 90 capsule 0   ezetimibe  (ZETIA ) 10 MG tablet Take 1 tablet by mouth once daily. 90 tablet 0   gabapentin  (NEURONTIN ) 100 MG capsule Take 1 capsule (100 mg total) by mouth 3 (three) times daily. 270 capsule 1   glucose blood (ACCU-CHEK GUIDE TEST) test strip 1 each by Other route as needed for other. Use as instructed     glucose blood (ACCU-CHEK GUIDE TEST) test strip Use to check blood sugar twice a day. E11.9 200 each 3   HYDROcodone -acetaminophen  (NORCO) 5-325 MG tablet Take 1 tablet by mouth every 6 (six) hours as needed for moderate pain (pain score 4-6). 100 tablet 0   insulin  glargine, 1 Unit Dial , (TOUJEO  SOLOSTAR) 300 UNIT/ML Solostar Pen Inject 28-30 Units into the skin daily.     insulin  lispro (HUMALOG  KWIKPEN) 200 UNIT/ML KwikPen Inject 8-10 Units into the skin 2 (two) times daily before a meal. 12 mL 3   Insulin  Pen Needle 32G X 4 MM MISC Use 3x a day 300 each 3   levothyroxine  (SYNTHROID ) 88 MCG tablet Take 1 tablet by mouth once daily 60 tablet 1   lubiprostone  (AMITIZA ) 24 MCG capsule Take 1 capsule (24 mcg total) by mouth 2 (two) times daily with a meal. (Patient taking differently: Take 24 mcg by mouth  as needed for constipation.) 180 capsule 1   Semaglutide ,0.25 or 0.5MG /DOS, 2 MG/1.5ML SOPN Inject 0.5 mg into the skin once a week. 3 mL 5   terazosin  (HYTRIN ) 2 MG capsule Take 1 capsule (2 mg total) by mouth at bedtime. 90 capsule 3   torsemide  (DEMADEX ) 20 MG tablet Take 1 tablet (20 mg total) by mouth daily. 90 tablet 0   traZODone  (DESYREL ) 50 MG tablet Take 1 tablet (50 mg total) by mouth at bedtime as needed for sleep. 90 tablet 0   valsartan  (DIOVAN ) 80 MG tablet Take 80 mg by mouth daily.     No current facility-administered medications on file prior to visit.   Allergies  Allergen Reactions   Anesthetics, Ester Anaphylaxis   Family History  Problem Relation Age of Onset   Diabetes Mother    Heart disease Mother        Before age 74   Cancer Father         Lung   Hypertension Sister    Diabetes Sister    Diabetes Sister    Diabetes Sister    PE: BP 120/70   Pulse (!) 106   Ht 5' 2 (1.575 m)   Wt 145 lb 9.6 oz (66 kg)   SpO2 99%   BMI 26.63 kg/m  Wt Readings from Last 10 Encounters:  06/21/24 145 lb 9.6 oz (66 kg)  05/24/24 142 lb (64.4 kg)  04/30/24 142 lb 12.8 oz (64.8 kg)  02/16/24 142 lb 3.2 oz (64.5 kg)  02/13/24 146 lb 12.8 oz (66.6 kg)  01/26/24 143 lb 12.8 oz (65.2 kg)  10/18/23 145 lb (65.8 kg)  09/23/23 147 lb (66.7 kg)  09/06/23 143 lb 3.2 oz (65 kg)  08/16/23 146 lb 3.2 oz (66.3 kg)   Constitutional: normal weight, in NAD Eyes: EOMI, no exophthalmos ENT: no thyromegaly, no cervical lymphadenopathy Cardiovascular: Tachycardia, RR, No MRG Respiratory: CTA B Musculoskeletal: no deformities Skin: no rashes Neurological: very slight tremor with outstretched hands  ASSESSMENT: 1. DM2, insulin -dependent, uncontrolled, with complications - CVA - carotid stenosis - PAD - CKD stage 4 - PN - Hypoglycemia - Nonketotic hyperglycemic state in 2017  2.  Hypothyroidism  3.  Multinodular goiter  PLAN:  1. Patient with longstanding, uncontrolled, type 2 diabetes, on basal/bolus insulin  regimen and weekly GLP-1 receptor agonist, with very high HbA1c earlier this year, at 11%, but improving afterwards.  At last visit HbA1c was much better, at 7.7%.  Reviewing the CGM trends at that time, she appeared to have dropping blood sugars at night and then increasing in the second half of the night but with the largest increase being after breakfast.  She also had slight hyperglycemia in the afternoon but sugars were trending down after dinner.  We increased her Humalog  dose before the first meal of the day and decrease the dose with dinner.  I also advised her to use lower doses of Toujeo  to avoid low blood sugars at night.  We did not change her Ozempic  dose. CGM interpretation:  -At today's visit, we reviewed her CGM  downloads: It appears that 27% of values are in target range (goal >70%), while 70% are higher than 180 (goal <25%), and 3% are lower than 70 (goal <4%).  The calculated average blood sugar is 223.  The projected HbA1c for the next 3 months (GMI) is 8.7%. -Reviewing the CGM trends, sugars are much worse than before, with occasional significant hypoglycemia overnight, and sugars in the 200-300s  after waking up, with slow improvement during the rest of the day.  Upon questioning, she has not made the changes that I suggested in her Humalog  doses at last visit that she forgot.  She is taking a fixed dose of Humalog , 10 units before each meal.  We discussed that she needs a higher dose before breakfast, and likely a lower dose before dinner.  She also takes occasionally Humalog  after meals, which I believe is the reason for her blood sugars dropping significantly during the night.  I strongly advised her to try to take it 15 minutes before meals but if she forgets and takes it after the meal, to take only half of the amount.  She is taking the lower dose of Toujeo  than recommended at last visit that she forgot how much she needed to take.  Will go ahead and increase this at today's visit.  She is also interested in increasing her Ozempic  dose today.  She tolerates the lower dose well. - I advised her to: Patient Instructions  Please increase: - Ozempic  1 mg weekly  Change: - Toujeo  24 units daily in am - Humalog  15 min before meals: - 12 units before B'fast - 10 units before lunch - 8 units before dinner If you have to take the Humalog  after a meal, take only half of the amount.  Please continue levothyroxine  88 mcg daily.  Take the thyroid  hormone every day, with water, at least 30 minutes before breakfast, separated by at least 4 hours from: - acid reflux medications - calcium  - iron - multivitamins  Please return in 2-3 months.  - we checked her HbA1c: 9.3% (higher) - advised to check sugars  at different times of the day - 4x a day, rotating check times - advised for yearly eye exams >> she is UTD - return to clinic in 2-3 months  2.  Hypothyroidism - latest thyroid  labs reviewed with pt. >> normal: Lab Results  Component Value Date   TSH 3.67 01/26/2024  - she continues on LT4 88 mcg daily - pt feels good on this dose. - we discussed about taking the thyroid  hormone every day, with water, >30 minutes before breakfast, separated by >4 hours from acid reflux medications, calcium , iron, multivitamins. Pt. is taking it correctly.   3.  Multinodular goiter - He denies neck compression symptoms - Most recent thyroid  ultrasound showed stability of her nodules over 6 years - Continue to follow her clinically.  No further imaging necessary  Lela Fendt, MD PhD [facility]

## 2024-06-25 ENCOUNTER — Other Ambulatory Visit: Payer: Self-pay | Admitting: Internal Medicine

## 2024-06-25 DIAGNOSIS — I10 Essential (primary) hypertension: Secondary | ICD-10-CM

## 2024-07-05 ENCOUNTER — Other Ambulatory Visit: Payer: Self-pay | Admitting: Internal Medicine

## 2024-07-05 DIAGNOSIS — E119 Type 2 diabetes mellitus without complications: Secondary | ICD-10-CM

## 2024-07-05 NOTE — Telephone Encounter (Signed)
 Refill request complete

## 2024-07-06 ENCOUNTER — Encounter: Admitting: Cardiology

## 2024-07-06 DIAGNOSIS — G4733 Obstructive sleep apnea (adult) (pediatric): Secondary | ICD-10-CM | POA: Diagnosis not present

## 2024-07-09 ENCOUNTER — Encounter (HOSPITAL_COMMUNITY): Payer: Self-pay | Admitting: Psychiatry

## 2024-07-09 ENCOUNTER — Telehealth (HOSPITAL_COMMUNITY): Admitting: Psychiatry

## 2024-07-09 ENCOUNTER — Ambulatory Visit: Attending: Nurse Practitioner

## 2024-07-09 VITALS — Wt 145.0 lb

## 2024-07-09 DIAGNOSIS — R0683 Snoring: Secondary | ICD-10-CM

## 2024-07-09 DIAGNOSIS — E785 Hyperlipidemia, unspecified: Secondary | ICD-10-CM

## 2024-07-09 DIAGNOSIS — I6529 Occlusion and stenosis of unspecified carotid artery: Secondary | ICD-10-CM

## 2024-07-09 DIAGNOSIS — F331 Major depressive disorder, recurrent, moderate: Secondary | ICD-10-CM

## 2024-07-09 DIAGNOSIS — F411 Generalized anxiety disorder: Secondary | ICD-10-CM

## 2024-07-09 DIAGNOSIS — R251 Tremor, unspecified: Secondary | ICD-10-CM

## 2024-07-09 DIAGNOSIS — I10 Essential (primary) hypertension: Secondary | ICD-10-CM

## 2024-07-09 DIAGNOSIS — I63512 Cerebral infarction due to unspecified occlusion or stenosis of left middle cerebral artery: Secondary | ICD-10-CM

## 2024-07-09 MED ORDER — BENZTROPINE MESYLATE 0.5 MG PO TABS
0.5000 mg | ORAL_TABLET | Freq: Every day | ORAL | 0 refills | Status: AC
Start: 2024-07-09 — End: 2024-10-07

## 2024-07-09 MED ORDER — TRAZODONE HCL 100 MG PO TABS: 100.0000 mg | ORAL_TABLET | Freq: Every evening | ORAL | 0 refills | Status: AC | PRN

## 2024-07-09 MED ORDER — BUPROPION HCL ER (XL) 300 MG PO TB24: 300.0000 mg | ORAL_TABLET | Freq: Every day | ORAL | 0 refills | Status: AC

## 2024-07-09 MED ORDER — BREXPIPRAZOLE 1 MG PO TABS: 1.0000 mg | ORAL_TABLET | Freq: Every evening | ORAL | 0 refills | Status: AC

## 2024-07-09 NOTE — Progress Notes (Signed)
 [facility] MD Virtual Progress Note   Patient Location: Home Provider Location: Office  I connect with patient by home and verified that I am speaking with correct person by using two identifiers. I discussed the limitations of evaluation and management by telemedicine and the availability of in person appointments. I also discussed with the patient that there may be a patient responsible charge related to this service. The patient expressed understanding and agreed to proceed.  Sonia Skinner [id number] 73 y.o.  07/09/2024 3:09 PM  History of Present Illness:  Patient is evaluated by video session.  She reported lately feeling fatigued, tired and not motivated to do things.  She is not sure if anything trigger but lately more sad.  She had a blood work and her hemoglobin A1c jumped to 9.3.  It was before 7.7.  She admitted there was a time she was not taking Ozempic  because she had lost significant weight but now she is back on Ozempic .  She has mild tremors but does not interfere in her daily activities.  She denies any hallucination, paranoia, suicidal thoughts or homicidal thoughts.  Her appetite is fair.  She reported poor sleep and racing thoughts.  She is taking trazodone  but she feel it is not helping as much.  She has no travel plan for upcoming holidays.  All her family live close by.  She is taking Rexulti , Wellbutrin , Cogentin  and trazodone .  She reported getting along better with her husband.  She does not want to talk about her living situation but feel she is fine and comfortable.  She denies any agitation, anger, mood swings.  She denies drinking or using any illegal substances.  Past Psychiatric History: H/O taking antidepressant on and off most of life. H/O overdose and inpatient at [facility]. Saw provider at [facility] and given the Strattera, Adderall, Zoloft, Celexa, Lamictal  and Lexapro .  Never tested for ADHD. No h/o psychosis, mania or hallucination.   In 2001 moved to [place] [facility] [place]  and given amitriptyline  and Wellbutrin  to stop smoking. Prozac  and Paxil  made tired and nausea.    Past Medical History:  Diagnosis Date   Ankle fracture    Left   Anxiety    Carotid artery occlusion    Closed fracture of left distal fibula 09/16/2017   Complication of anesthesia    ALLERY TO ESTER BASE   Depression    early 30s   Diabetes mellitus    INSULIN  DEPENDENT   GERD (gastroesophageal reflux disease)    Headache    years ago   Hypertension    Pneumonia    Stroke [facility]) March 2015    left MCA infarct, slight weakness on left side   Thyroid  disease     Outpatient Encounter Medications as of 07/09/2024  Medication Sig   Accu-Chek FastClix Lancets MISC Use to check blood sugar five times daily.   ACCU-CHEK GUIDE test strip Use to check blood sugar twice a day. E11.9   amLODipine  (NORVASC ) 5 MG tablet Take 1 tablet (5 mg total) by mouth daily.   aspirin  81 MG chewable tablet Chew 1 tablet (81 mg total) by mouth daily.   atorvastatin  (LIPITOR ) 40 MG tablet Take 1 tablet (40 mg total) by mouth daily.   benztropine  (COGENTIN ) 0.5 MG tablet Take 1 tablet (0.5 mg total) by mouth at bedtime.   Blood Pressure Monitoring (OMRON 3 SERIES BP MONITOR) DEVI Use daily as directed to monitor blood pressure   brexpiprazole  (REXULTI ) 1 MG TABS  tablet Take 1 tablet (1 mg total) by mouth at bedtime.   buPROPion  (WELLBUTRIN  XL) 300 MG 24 hr tablet Take 1 tablet (300 mg total) by mouth daily.   Continuous Blood Gluc Receiver (DEXCOM G7 RECEIVER) DEVI 1 Act by Does not apply route daily.   Continuous Glucose Sensor (DEXCOM G7 SENSOR) MISC USE AS DIRECTED EVERY  10  DAYS   Dexlansoprazole  30 MG capsule DR Take 1 capsule by mouth once daily   ezetimibe  (ZETIA ) 10 MG tablet Take 1 tablet by mouth once daily.   gabapentin  (NEURONTIN ) 100 MG capsule Take 1 capsule (100 mg total) by mouth 3 (three) times daily.   glucose blood (ACCU-CHEK GUIDE TEST) test strip 1 each  by Other route as needed for other. Use as instructed   glucose blood (ACCU-CHEK GUIDE TEST) test strip Use to check blood sugar twice a day. E11.9   HYDROcodone -acetaminophen  (NORCO) 5-325 MG tablet Take 1 tablet by mouth every 6 (six) hours as needed for moderate pain (pain score 4-6).   insulin  glargine, 1 Unit Dial , (TOUJEO  SOLOSTAR) 300 UNIT/ML Solostar Pen Inject 24 Units into the skin daily.   insulin  lispro (HUMALOG  KWIKPEN) 200 UNIT/ML KwikPen Inject 8-12 Units into the skin 2 (two) times daily before a meal.   Insulin  Pen Needle 32G X 4 MM MISC Use 3x a day   levothyroxine  (SYNTHROID ) 88 MCG tablet Take 1 tablet (88 mcg total) by mouth daily.   lubiprostone  (AMITIZA ) 24 MCG capsule Take 1 capsule (24 mcg total) by mouth 2 (two) times daily with a meal. (Patient taking differently: Take 24 mcg by mouth as needed for constipation.)   Semaglutide , 1 MG/DOSE, 4 MG/3ML SOPN Inject 1 mg as directed once a week.   terazosin  (HYTRIN ) 2 MG capsule Take 1 capsule (2 mg total) by mouth at bedtime.   torsemide  (DEMADEX ) 20 MG tablet Take 1 tablet (20 mg total) by mouth daily.   traZODone  (DESYREL ) 50 MG tablet Take 1 tablet (50 mg total) by mouth at bedtime as needed for sleep.   valsartan  (DIOVAN ) 80 MG tablet Take 80 mg by mouth daily.   No facility-administered encounter medications on file as of 07/09/2024.    Recent Results (from the past 2160 hours)  POCT glycosylated hemoglobin (Hb A1C)     Status: Abnormal   Collection Time: 06/21/24  2:14 PM  Result Value Ref Range   Hemoglobin A1C 9.3 (A) 4.0 - 5.6 %   HbA1c POC (<> result, manual entry)     HbA1c, POC (prediabetic range)     HbA1c, POC (controlled diabetic range)       Psychiatric Specialty Exam: Physical Exam  Review of Systems  Constitutional:  Positive for fatigue.    Weight 145 lb (65.8 kg).There is no height or weight on file to calculate BMI.  General Appearance: Casual  Eye Contact:  Fair  Speech:  Slow  Volume:   Decreased  Mood:  Dysphoric  Affect:  Congruent  Thought Process:  Goal Directed  Orientation:  Full (Time, Place, and Person)  Thought Content:  WDL  Suicidal Thoughts:  No  Homicidal Thoughts:  No  Memory:  Immediate;   Good Recent;   Good Remote;   Fair  Judgement:  Fair  Insight:  Shallow  Psychomotor Activity:  Decreased  Concentration:  Concentration: Fair and Attention Span: Fair  Recall:  Good  Fund of Knowledge:  Good  Language:  Good  Akathisia:  No  Handed:  Right  AIMS (if indicated):     Assets:  Communication Skills Desire for Improvement Housing Transportation  ADL's:  Intact  Cognition:  WNL  Sleep:  poor       05/24/2024    1:19 PM 01/26/2024    2:45 PM 05/18/2023   12:55 PM 01/27/2023   10:58 AM 03/29/2022   10:08 AM  Depression screen PHQ 2/9  Decreased Interest 0 0 0 1 2  Down, Depressed, Hopeless 0 0 1 1   PHQ - 2 Score 0 0 1 2 2   Altered sleeping 0 0 0 1 2  Tired, decreased energy 0 0 1 3 3   Change in appetite 0 0 1 3 2   Feeling bad or failure about yourself  0 0 0 0 1  Trouble concentrating 0 0 0 0 1  Moving slowly or fidgety/restless 0 0 0 0 2  Suicidal thoughts 0 0 0 0 1  PHQ-9 Score 0  0  3  9  14    Difficult doing work/chores Not difficult at all Not difficult at all Somewhat difficult  Not difficult at all     Data saved with a previous flowsheet row definition    Assessment/Plan: Moderate episode of recurrent major depressive disorder (HCC) - Plan: benztropine  (COGENTIN ) 0.5 MG tablet, buPROPion  (WELLBUTRIN  XL) 300 MG 24 hr tablet, traZODone  (DESYREL ) 100 MG tablet, brexpiprazole  (REXULTI ) 1 MG TABS tablet  Tremor - Plan: benztropine  (COGENTIN ) 0.5 MG tablet  GAD (generalized anxiety disorder) - Plan: buPROPion  (WELLBUTRIN  XL) 300 MG 24 hr tablet, brexpiprazole  (REXULTI ) 1 MG TABS tablet  Patient is 73 year old African-American employed female with history of hypertension, CVA, GERD, diabetes, major depressive disorder, anxiety and  tremors.  Reviewed blood work results.  Her hemoglobin A1c jumped from 7.7-9.3.  Discussed fatigue could be the reason for high blood sugar.  Patient told she is working with her endocrinologist to had a better control on her blood sugar.  She is hoping since started Ozempic  numbers will be better next time.  I discussed to optimize trazodone  dose to help her sleep and she agreed with the plan.  Will try trazodone  100 mg at bedtime and keep the Cogentin  0.5 mg at bedtime, Wellbutrin  XL 300 mg daily and Rexulti  1 mg at bedtime.  Patient is not interested in therapy.  Recommend to call back if she has any question or any concern.  Follow-up in 3 months.   Follow Up Instructions:     I discussed the assessment and treatment plan with the patient. The patient was provided an opportunity to ask questions and all were answered. The patient agreed with the plan and demonstrated an understanding of the instructions.   The patient was advised to call back or seek an in-person evaluation if the symptoms worsen or if the condition fails to improve as anticipated.    Collaboration of Care: Other provider involved in patient's care AEB notes are available in epic to review  Patient/Guardian was advised Release of Information must be obtained prior to any record release in order to collaborate their care with an outside provider. Patient/Guardian was advised if they have not already done so to contact the registration department to sign all necessary forms in order for us  to release information regarding their care.   Consent: Patient/Guardian gives verbal consent for treatment and assignment of benefits for services provided during this visit. Patient/Guardian expressed understanding and agreed to proceed.     Total encounter time 12 minutes which includes face-to-face time,  chart reviewed, care coordination, order entry and documentation during this encounter.   Note: This document was prepared by [employer]  voice dictation technology and any errors that results from this process are unintentional.    Leni ONEIDA Client, MD 07/09/2024

## 2024-07-09 NOTE — Procedures (Signed)
    SLEEP STUDY REPORT Patient Information Study Date: 07/06/2024 Patient Name: Sonia Skinner Patient ID: [id number] Birth Date: 1950/11/05 Age: 73 Gender: Female BMI: 26.0 (W=141 lb, H=5' 2'') Referring Physician: Jackee Alberts, NP  TEST DESCRIPTION: Home sleep apnea testing was completed using the WatchPat, a Type 1 device, utilizing  peripheral arterial tonometry (PAT), chest movement, actigraphy, pulse oximetry, pulse rate, body position and snore.  AHI was calculated with apnea and hypopnea using valid sleep time as the denominator. RDI includes apneas,  hypopneas, and RERAs. The data acquired and the scoring of sleep and all associated events were performed in  accordance with the recommended standards and specifications as outlined in the AASM Manual for the Scoring of  Sleep and Associated Events 2.2.0 (2015).   FINDINGS:   1. Mild Obstructive Sleep Apnea with AHI 12.3/hr.   2. No Central Sleep Apnea with pAHIc 2.5/hr.   3. Oxygen desaturations as low as 85%.   4. Severe snoring was present. O2 sats were < 88% for 0.1 min.   5. Total sleep time was 6 hrs and 58 min.   6. 26.7% of total sleep time was spent in REM sleep.   7. Prolonged sleep onset latency at 100 min.   8. Prolonged REM sleep onset latency at 111 min.   9. Total awakenings were 8.  10. Arrhythmia detection: None  DIAGNOSIS: Mild Obstructive Sleep Apnea (G47.33)  RECOMMENDATIONS: 1. Clinical correlation of these findings is necessary. The decision to treat obstructive sleep apnea (OSA) is usually  based on the presence of apnea symptoms or the presence of associated medical conditions such as Hypertension,  Congestive Heart Failure, Atrial Fibrillation or Obesity. The most common symptoms of OSA are snoring, gasping for  breath while sleeping, daytime sleepiness and fatigue.  2. Initiating apnea therapy is recommended given the presence of symptoms and/or associated conditions.  Recommend proceeding with one of  the following:  a. Auto-CPAP therapy with a pressure range of 5-20cm H2O.  b. An oral appliance (OA) that can be obtained from certain dentists with expertise in sleep medicine. These are  primarily of use in non-obese patients with mild and moderate disease.  c. An ENT consultation which may be useful to look for specific causes of obstruction and possible treatment  options.  d. If patient is intolerant to PAP therapy, consider referral to ENT for evaluation for hypoglossal nerve stimulator.  3. Close follow-up is necessary to ensure success with CPAP or oral appliance therapy for maximum benefit . 4. A follow-up oximetry study on CPAP is recommended to assess the adequacy of therapy and determine the need  for supplemental oxygen or the potential need for Bi-level therapy. An arterial blood gas to determine the adequacy of  baseline ventilation and oxygenation should also be considered. 5. Healthy sleep recommendations include: adequate nightly sleep (normal 7-9 hrs/night), avoidance of caffeine after  noon and alcohol near bedtime, and maintaining a sleep environment that is cool, dark and quiet. 6. Weight loss for overweight patients is recommended. Even modest amounts of weight loss can significantly  improve the severity of sleep apnea. 7. Snoring recommendations include: weight loss where appropriate, side sleeping, and avoidance of alcohol before  bed. 8. Operation of motor vehicle should be avoided when sleepy.  Signature: Wilbert Bihari, MD; [facility]; Diplomat, American Board of Sleep  Medicine Electronically Signed: 07/09/2024 4:39:44 PM

## 2024-07-19 ENCOUNTER — Telehealth (HOSPITAL_COMMUNITY): Admitting: Psychiatry

## 2024-07-26 ENCOUNTER — Telehealth: Payer: Self-pay | Admitting: *Deleted

## 2024-07-26 DIAGNOSIS — G4733 Obstructive sleep apnea (adult) (pediatric): Secondary | ICD-10-CM

## 2024-07-26 DIAGNOSIS — R0683 Snoring: Secondary | ICD-10-CM

## 2024-07-26 DIAGNOSIS — I10 Essential (primary) hypertension: Secondary | ICD-10-CM

## 2024-07-26 DIAGNOSIS — I2510 Atherosclerotic heart disease of native coronary artery without angina pectoris: Secondary | ICD-10-CM

## 2024-07-26 NOTE — Telephone Encounter (Signed)
 The patient has been notified of the result and verbalized understanding.  All questions (if any) were answered. Sonia Skinner, CMA 07/26/2024 4:23 PM     Patient will call back in a couple weeks to tell us  if she wants to proceed.

## 2024-07-26 NOTE — Telephone Encounter (Signed)
-----   Message from Wilbert Bihari, MD sent at 07/09/2024  4:41 PM EST ----- Please let patient know that they have sleep apnea and recommend treating with CPAP.  Please order an auto CPAP from 4-15cm H2O with heated humidity and mask of choice.  Order overnight pulse ox on CPAP.  Followup with me in 6 weeks.

## 2024-07-30 ENCOUNTER — Ambulatory Visit: Admitting: Internal Medicine

## 2024-08-24 ENCOUNTER — Ambulatory Visit: Admitting: Internal Medicine

## 2024-08-27 ENCOUNTER — Encounter: Payer: Self-pay | Admitting: Internal Medicine

## 2024-08-27 ENCOUNTER — Ambulatory Visit: Admitting: Internal Medicine

## 2024-08-27 VITALS — BP 132/60 | HR 92 | Ht 62.0 in | Wt 147.0 lb

## 2024-08-27 DIAGNOSIS — Z7985 Long-term (current) use of injectable non-insulin antidiabetic drugs: Secondary | ICD-10-CM

## 2024-08-27 DIAGNOSIS — E1159 Type 2 diabetes mellitus with other circulatory complications: Secondary | ICD-10-CM

## 2024-08-27 DIAGNOSIS — E1165 Type 2 diabetes mellitus with hyperglycemia: Secondary | ICD-10-CM | POA: Diagnosis not present

## 2024-08-27 DIAGNOSIS — E039 Hypothyroidism, unspecified: Secondary | ICD-10-CM

## 2024-08-27 DIAGNOSIS — E042 Nontoxic multinodular goiter: Secondary | ICD-10-CM

## 2024-08-27 DIAGNOSIS — Z794 Long term (current) use of insulin: Secondary | ICD-10-CM

## 2024-08-27 LAB — POCT GLYCOSYLATED HEMOGLOBIN (HGB A1C): Hemoglobin A1C: 10.9 % — AB (ref 4.0–5.6)

## 2024-08-27 MED ORDER — SEMAGLUTIDE (1 MG/DOSE) 4 MG/3ML ~~LOC~~ SOPN: 1.0000 mg | PEN_INJECTOR | SUBCUTANEOUS | 3 refills | Status: AC

## 2024-08-27 NOTE — Addendum Note (Signed)
 Addended by: CLEOTILDE ROLIN RAMAN on: 08/27/2024 04:46 PM   Modules accepted: Orders

## 2024-08-27 NOTE — Patient Instructions (Addendum)
 Please restart: - Ozempic  0.5 mg weekly, then increase to 1 mg weekly  Continue: - Toujeo  24-26 units daily in am  Use: - Humalog  15 min before meals  B'fast: 16-18 units Lunch: 12-16 units Dinner: 12-16 units If you have to take the Humalog  after a meal, take only half of the amount.  STOP ANY SWEET DRINKS!  NO EATING IN THE MIDDLE OF THE NIGHT!  Please continue levothyroxine  88 mcg daily.  Take the thyroid  hormone every day, with water, at least 30 minutes before breakfast, separated by at least 4 hours from: - acid reflux medications - calcium  - iron - multivitamins  Please return in 1-1.5 months.

## 2024-08-27 NOTE — Progress Notes (Signed)
 Patient ID: Sonia Skinner, female   DOB: 1950/11/02, 74 y.o.   MRN: [id number]  HPI: Sonia Skinner is a 74 y.o.-year-old female, returning for follow-up for DM2, dx in 1962, insulin -dependent since 2000, uncontrolled, with complications (CVA, left carotid stenosis, PAD, CKD stage IV, PN, history of hypoglycemia, history of nonketotic hyperglycemic state). Pt. previously saw Dr. Kassie, but last visit with me 2 months ago.  Interim history: No increased urination, no nausea, chest pain.    Reviewed HbA1c: Lab Results  Component Value Date   HGBA1C 9.3 (A) 06/21/2024   HGBA1C 7.7 (H) 01/26/2024   HGBA1C 11.0 (A) 09/06/2023   HGBA1C 9.2 (H) 05/03/2023   HGBA1C 8.1 (A) 12/31/2022   HGBA1C 8.9 (H) 03/29/2022   HGBA1C 8.1 (H) 02/26/2022   HGBA1C 7.8 (H) 11/14/2021   HGBA1C 8.0 (A) 11/02/2021   HGBA1C 8.5 (H) 08/15/2021   At last visit she was on: - Ozempic  1 >> 0.5 mg weekly (too much weight loss and decreased appetite of the higher dose) - Toujeo  32 >> 28 >> actually taking 20 units daily in am - Humalog  5-8 >> 8-10 units 15 min before meals >>  - 12-15 units before B'fast - 8-10 units before lunch - 6-8 units before dinner But actually takes 10 units before each meal!  I advised her to use the following regimen: - Ozempic  1 mg weekly - off now as she felt that she lost weight on it - Toujeo  24-26 units daily in am - Humalog  15 min before meals: but missing many doses! - 12 units before B'fast - 10 units before lunch - 8 units before dinner  >> actually using: 12-16 units If you have to take the Humalog  after a meal, take only half of the amount.  Pt checks her sugars more than 4 times a day with her Dexcom CGM:  Previously:  Previously:   Lowest sugar was 63 >> 40 >> 60s; she has hypoglycemia awareness at 70.  Highest sugar was 401 >> 400 >> HI.  In 2017, she had nonketotic hyperglycemic state. She was admitted 01/2023 with AKI + hyperkalemia due to using both Ozempic  and  Farxiga .  At that time it was also noted that she had nausea/vomiting/poor oral intake and presyncope while on Ozempic .  She was taken off both Ozempic  and Farxiga  at that time.  Glucometer: Accu-Chek guide  Meals: - coffee + sugar at 10 am - takes 6 units after coffee - lunch: fried chicken + yeast rolls, etc - dinner: meat + potatoes + collard greens  vanilla + chocolate. She wakes up in the middle of the night to eat cookies, snacks, and other sweets. She drinks lemonade and sweet tea.  - + CKD stage IV - sees Dr. Dalene (nephrology), last BUN/creatinine:  Lab Results  Component Value Date   BUN 19 01/26/2024   BUN 25 (A) 04/19/2023   CREATININE 1.68 (H) 01/26/2024   CREATININE 2.2 (A) 04/19/2023   Lab Results  Component Value Date   MICRALBCREAT 2 04/20/2023   MICRALBCREAT 3 11/02/2022   MICRALBCREAT 53 11/02/2022   MICRALBCREAT 7 04/01/2020  Not on ACE inhibitor/ARB.  -+ HL; last set of lipids: Lab Results  Component Value Date   CHOL 170 01/26/2024   HDL 51.50 01/26/2024   LDLCALC 88 01/26/2024   LDLDIRECT 141 (H) 03/04/2010   TRIG 153.0 (H) 01/26/2024   CHOLHDL 3 01/26/2024  On Lipitor  40 mg daily. On Zetia  10 mg daily.  - last  eye exam was 04/2023. No DR.   - no numbness and tingling in her feet.  Foot exam 01/26/2024.  She is on gabapentin  100 mg 3 times a day.  She has a history of multinodular goiter, diagnosed in 2017.  Latest thyroid  ultrasound (11/09/2021) reviewed: Parenchymal Echotexture: Moderately heterogenous  Isthmus: Normal in size measuring 0.3 cm in diameter, unchanged  Right lobe: Normal in size measuring 5.0 x 1.8 x 1.0 cm, previously, 4.9 x 1.7 x 1.4 cm  Left lobe: Normal in size measuring 4.8 x 1.6 x 1.6 cm, previously, 5.3 x 2.0 x 1.9 cm _________________________________________________________   The approximately 1.2 x 1.0 x 0.9 cm isoechoic ill-defined nodule within the mid aspect the right lobe of the thyroid  (labeled 1), is grossly  unchanged compared to the 05/2016 examination, previously, 1.2 x 1.1 x 0.9 cm and is again noted to contain an internal partially shadowing macrocalcification. Imaging stability for greater than 5 years is indicative of a benign etiology.   The approximately 1.0 x 1.0 x 0.5 cm spongiform/benign-appearing nodule within the inferior, medial aspect of the right lobe of the thyroid  (labeled 2), is grossly unchanged compared to the 05/2016 examination, previously, 1.0 cm, and again does not meet criteria to recommend percutaneous sampling or continued dedicated follow-up.  _________________________________________________________   The approximately 2.5 x 1.5 x 1.3 cm isoechoic nodule within mid aspect the left lobe of the thyroid  (labeled 3), is unchanged compared to the 05/2016 examination, previously, 2.5 x 1.8 x 1.3 cm and is again noted to contain an internal partially shadowing macrocalcification. Imaging stability for greater than 5 years is indicative of a benign etiology.   IMPRESSION: 1. Similar findings of multinodular goiter. No worrisome new or enlarging thyroid  nodules. 2. All discretely thyroid  nodules are unchanged since the 05/2016 examination. By report, dominant bilateral thyroid  nodules may have been biopsied at an outside institution. If so, correlation with previous biopsy results is advised. Otherwise, imaging stability for greater than 5 years is indicative of a benign etiology and as such percutaneous sampling and/or continued dedicated follow-up is not recommended.  Pt denies: - feeling nodules in neck - hoarseness - dysphagia - choking  Hypothyroidism:  She is on Synthroid  88 mcg daily: - in am - fasting - right before b'fast >> moved 30 minutes before breakfast - no calcium  - no iron - no multivitamins - + PPIs  - Dexilant  in am 30 min to 1h >> moved later in the day - not on Biotin  Reviewed most recent TFTs: Lab Results  Component Value Date    TSH 3.67 01/26/2024   TSH 1.09 05/03/2023   TSH 0.368 01/19/2023   TSH 4.23 03/29/2022   TSH 3.75 02/25/2022   TSH 1.088 11/14/2021   TSH 11.69 (H) 11/02/2021   TSH 0.25 (L) 08/27/2021   TSH 0.053 (L) 08/15/2021   TSH 33.15 (H) 04/07/2021   She is not on steroids or biotin.  She also has a history of GERD, headache, HTN, depression. She was admitted 08/12/2022 with SBO.  ROS: + see HPI Past Medical History:  Diagnosis Date   Ankle fracture    Left   Anxiety    Carotid artery occlusion    Closed fracture of left distal fibula 09/16/2017   Complication of anesthesia    ALLERY TO ESTER BASE   Depression    early 30s   Diabetes mellitus    INSULIN  DEPENDENT   GERD (gastroesophageal reflux disease)    Headache  years ago   Hypertension    Pneumonia    Stroke [facility]) March 2015    left MCA infarct, slight weakness on left side   Thyroid  disease    Past Surgical History:  Procedure Laterality Date   ABDOMINAL HYSTERECTOMY     ANTERIOR FIXATION AND POSTERIOR MICRODISCECTOMY CERVICAL SPINE  1999   CAROTID ENDARTERECTOMY Left 11-04-13   cea   ENDARTERECTOMY Left 11/04/2013   IR ANGIO INTRA EXTRACRAN SEL COM CAROTID INNOMINATE BILAT MOD SED  11/16/2021   IR ANGIO INTRA EXTRACRAN SEL COM CAROTID INNOMINATE UNI L MOD SED  11/19/2021   IR ANGIO VERTEBRAL SEL VERTEBRAL BILAT MOD SED  11/16/2021   IR CT HEAD LTD  11/19/2021   IR INTRAVSC STENT CERV CAROTID W/EMB-PROT MOD SED INCL ANGIO  11/19/2021   IR RADIOLOGIST EVAL & MGMT  12/13/2021   ORIF ANKLE FRACTURE Left 09/16/2017   Procedure: OPEN REDUCTION INTERNAL FIXATION (ORIF) LEFT ANKLE FRACTURE;  Surgeon: Josefina Chew, MD;  Location: [facility] OR;  Service: Orthopedics;  Laterality: Left;   RADIOLOGY WITH ANESTHESIA N/A 11/19/2021   Procedure: Left carotid angioplasty with possible stenting;  Surgeon: Dolphus Carrion, MD;  Location: [facility] OR;  Service: Radiology;  Laterality: N/A;   Social History   Socioeconomic History   Marital status:  Legally Separated    Spouse name: Not on file   Number of children: 0   Years of education: College   Highest education level: Not on file  Occupational History   Occupation: [employer]    Employer: [employer]  Tobacco Use   Smoking status: Former    Current packs/day: 0.00    Average packs/day: 1 pack/day for 20.0 years (20.0 ttl pk-yrs)    Types: Cigarettes    Start date: 11/01/1993    Quit date: 11/01/2013    Years since quitting: 10.8   Smokeless tobacco: Never  Vaping Use   Vaping status: Never Used  Substance and Sexual Activity   Alcohol use: Not Currently   Drug use: No   Sexual activity: Not Currently  Other Topics Concern   Not on file  Social History Narrative   Patient lives in 1 story home with sister.   Caffeine Use: 2 cups daily; sodas occasionally   Some college education   Works as lawyer for [employer]   Social Drivers of Health   Tobacco Use: Medium Risk (07/09/2024)   Patient History    Smoking Tobacco Use: Former    Smokeless Tobacco Use: Never    Passive Exposure: Not on Actuary Strain: Low Risk (05/24/2024)   Overall Financial Resource Strain (CARDIA)    Difficulty of Paying Living Expenses: Not hard at all  Food Insecurity: No Food Insecurity (05/24/2024)   Epic    Worried About Programme Researcher, Broadcasting/film/video in the Last Year: Never true    Ran Out of Food in the Last Year: Never true  Transportation Needs: No Transportation Needs (05/24/2024)   Epic    Lack of Transportation (Medical): No    Lack of Transportation (Non-Medical): No  Physical Activity: Inactive (05/24/2024)   Exercise Vital Sign    Days of Exercise per Week: 0 days    Minutes of Exercise per Session: 0 min  Stress: No Stress Concern Present (05/24/2024)   [employer] of Occupational Health - Occupational Stress Questionnaire    Feeling of Stress: Not at all  Social Connections: Moderately Integrated (05/24/2024)   Social Connection and Isolation Panel  Frequency of Communication with Friends and Family: More than three times a week    Frequency of Social Gatherings with Friends and Family: More than three times a week    Attends Religious Services: More than 4 times per year    Active Member of Clubs or Organizations: Yes    Attends Banker Meetings: More than 4 times per year    Marital Status: Separated  Intimate Partner Violence: Not At Risk (05/24/2024)   Epic    Fear of Current or Ex-Partner: No    Emotionally Abused: No    Physically Abused: No    Sexually Abused: No  Depression (PHQ2-9): Low Risk (05/24/2024)   Depression (PHQ2-9)    PHQ-2 Score: 0  Alcohol Screen: Low Risk (05/24/2024)   Alcohol Screen    Last Alcohol Screening Score (AUDIT): 0  Housing: Unknown (05/24/2024)   Epic    Unable to Pay for Housing in the Last Year: No    Number of Times Moved in the Last Year: Not on file    Homeless in the Last Year: No  Utilities: Not At Risk (05/24/2024)   Epic    Threatened with loss of utilities: No  Health Literacy: Adequate Health Literacy (05/24/2024)   B1300 Health Literacy    Frequency of need for help with medical instructions: Never   Current Outpatient Medications on File Prior to Visit  Medication Sig Dispense Refill   Accu-Chek FastClix Lancets MISC Use to check blood sugar five times daily. 510 each 2   ACCU-CHEK GUIDE test strip Use to check blood sugar twice a day. E11.9 100 each 2   amLODipine  (NORVASC ) 5 MG tablet Take 1 tablet (5 mg total) by mouth daily. 90 tablet 0   aspirin  81 MG chewable tablet Chew 1 tablet (81 mg total) by mouth daily. 30 tablet 1   atorvastatin  (LIPITOR ) 40 MG tablet Take 1 tablet (40 mg total) by mouth daily. 90 tablet 0   benztropine  (COGENTIN ) 0.5 MG tablet Take 1 tablet (0.5 mg total) by mouth at bedtime. 90 tablet 0   Blood Pressure Monitoring (OMRON 3 SERIES BP MONITOR) DEVI Use daily as directed to monitor blood pressure 1 each 0   brexpiprazole  (REXULTI ) 1 MG  TABS tablet Take 1 tablet (1 mg total) by mouth at bedtime. 90 tablet 0   buPROPion  (WELLBUTRIN  XL) 300 MG 24 hr tablet Take 1 tablet (300 mg total) by mouth daily. 90 tablet 0   Continuous Blood Gluc Receiver (DEXCOM G7 RECEIVER) DEVI 1 Act by Does not apply route daily. 2 each 5   Continuous Glucose Sensor (DEXCOM G7 SENSOR) MISC USE AS DIRECTED EVERY  10  DAYS 6 each 0   Dexlansoprazole  30 MG capsule DR Take 1 capsule by mouth once daily 90 capsule 0   ezetimibe  (ZETIA ) 10 MG tablet Take 1 tablet by mouth once daily. 90 tablet 0   gabapentin  (NEURONTIN ) 100 MG capsule Take 1 capsule (100 mg total) by mouth 3 (three) times daily. 270 capsule 1   glucose blood (ACCU-CHEK GUIDE TEST) test strip 1 each by Other route as needed for other. Use as instructed     glucose blood (ACCU-CHEK GUIDE TEST) test strip Use to check blood sugar twice a day. E11.9 200 each 3   HYDROcodone -acetaminophen  (NORCO) 5-325 MG tablet Take 1 tablet by mouth every 6 (six) hours as needed for moderate pain (pain score 4-6). 100 tablet 0   insulin  glargine, 1 Unit Dial , (TOUJEO  SOLOSTAR)  300 UNIT/ML Solostar Pen Inject 24 Units into the skin daily.     insulin  lispro (HUMALOG  KWIKPEN) 200 UNIT/ML KwikPen Inject 8-12 Units into the skin 2 (two) times daily before a meal.     Insulin  Pen Needle 32G X 4 MM MISC Use 3x a day 300 each 3   levothyroxine  (SYNTHROID ) 88 MCG tablet Take 1 tablet (88 mcg total) by mouth daily. 90 tablet 3   lubiprostone  (AMITIZA ) 24 MCG capsule Take 1 capsule (24 mcg total) by mouth 2 (two) times daily with a meal. (Patient taking differently: Take 24 mcg by mouth as needed for constipation.) 180 capsule 1   Semaglutide , 1 MG/DOSE, 4 MG/3ML SOPN Inject 1 mg as directed once a week. 9 mL 3   terazosin  (HYTRIN ) 2 MG capsule Take 1 capsule (2 mg total) by mouth at bedtime. 90 capsule 3   torsemide  (DEMADEX ) 20 MG tablet Take 1 tablet (20 mg total) by mouth daily. 90 tablet 0   traZODone  (DESYREL ) 100 MG  tablet Take 1 tablet (100 mg total) by mouth at bedtime as needed for sleep. 90 tablet 0   valsartan  (DIOVAN ) 80 MG tablet Take 80 mg by mouth daily.     No current facility-administered medications on file prior to visit.   Allergies  Allergen Reactions   Anesthetics, Ester Anaphylaxis   Family History  Problem Relation Age of Onset   Diabetes Mother    Heart disease Mother        Before age 11   Cancer Father        Lung   Hypertension Sister    Diabetes Sister    Diabetes Sister    Diabetes Sister    PE: BP 132/60   Pulse 92   Ht 5' 2 (1.575 m)   Wt 147 lb (66.7 kg)   SpO2 99%   BMI 26.89 kg/m  Wt Readings from Last 10 Encounters:  08/27/24 147 lb (66.7 kg)  06/21/24 145 lb 9.6 oz (66 kg)  05/24/24 142 lb (64.4 kg)  04/30/24 142 lb 12.8 oz (64.8 kg)  02/16/24 142 lb 3.2 oz (64.5 kg)  02/13/24 146 lb 12.8 oz (66.6 kg)  01/26/24 143 lb 12.8 oz (65.2 kg)  10/18/23 145 lb (65.8 kg)  09/23/23 147 lb (66.7 kg)  09/06/23 143 lb 3.2 oz (65 kg)   Constitutional: normal weight, in NAD Eyes: EOMI, no exophthalmos ENT: no thyromegaly, no cervical lymphadenopathy Cardiovascular: RRR, No MRG Respiratory: CTA B Musculoskeletal: no deformities Skin: no rashes Neurological: No tremor with outstretched hands  ASSESSMENT: 1. DM2, insulin -dependent, uncontrolled, with complications - CVA - carotid stenosis - PAD - CKD stage 4 - PN - Hypoglycemia - Nonketotic hyperglycemic state in 2017  2.  Hypothyroidism  3.  Multinodular goiter  PLAN:  1. Patient with longstanding, controlled, type 2 diabetes, on basal-bolus insulin  regimen and weekly GLP-1 receptor agonist, with still poor control.  At last visit, HbA1c was much higher, at 9.3%, increased from 7.7%.  Sugars were much worse than before, with occasional significant hypoglycemia overnight and sugars in the 200s-300s after waking up, with slow improvement during the rest of the day.  Upon questioning, she had not made  the Humalog  dose changes that I suggested at the previous visit as she forgot.  She was taking a fixed dose of Humalog , 10 units before each meal and we discussed about varying the dose depending on the size of the meal.  I also strongly advised her to  try to take the dose 15 minutes before every meal.  I advised her that if she forgot and had to take it after the meal, to only take half of the recommended dose.  She was also taking a lower dose of Toujeo  than recommended at the previous visit so we increased this.  She was interested in increasing the Ozempic  dose, and I advised her to do so. CGM interpretation:  -At today's visit, we reviewed her CGM downloads: It appears that 13% of values are in target range (goal >70%), while 86% are higher than 180 (goal <25%), and 1% are lower than 70 (goal <4%).  The calculated average blood sugar is 317.  The projected HbA1c for the next 3 months (GMI) is 10.9%. -Reviewing the CGM trends, percent very high, almost entirely fluctuating above the target range, increasing slightly overnight but then increasing drastically after breakfast and remaining elevated until evening.  She does have some lows around midnight. Upon questioning, she is forgetting many insulin  doses.  Even when she takes them, she may take them after the meals which is conducive to lows in the late postprandial period.  Also, she is eating sweets in the middle of the night and we discussed about stopping this.  Also, she drinks sweet tea and lemonade throughout the day.  She also will need to stop this.  She is off her Ozempic  due to initial weight loss, but she is interested in restarting it.  I recommended to start on a low dose and increase as tolerated to the 1 mg dose that she was previously using.  I believe she is on a good regimen, but she absolutely needs to stop sweet drinks and to have meal organization, spacing the meals every 4-5 hours and not snacking between meals or eating in the middle  of the night. - I advised her to: Patient Instructions  Please restart: - Ozempic  0.5 mg weekly, then increase to 1 mg weekly  Continue: - Toujeo  24-26 units daily in am  Use: - Humalog  15 min before meals  B'fast: 16-18 units Lunch: 12-16 units Dinner: 12-16 units If you have to take the Humalog  after a meal, take only half of the amount.  STOP ANY SWEET DRINKS!  NO EATING IN THE MIDDLE OF THE NIGHT!  Please continue levothyroxine  88 mcg daily.  Take the thyroid  hormone every day, with water, at least 30 minutes before breakfast, separated by at least 4 hours from: - acid reflux medications - calcium  - iron - multivitamins  Please return in 1-1.5 months.  - we checked her HbA1c: 10.9% (higher) - advised to check sugars at different times of the day - 4x a day, rotating check times - advised for yearly eye exams >> she is not UTD - return to clinic in 1-1.5 months  2.  Hypothyroidism - latest thyroid  labs reviewed with pt. >> normal: Lab Results  Component Value Date   TSH 3.67 01/26/2024  - she continues on LT4 88 mcg daily - pt feels good on this dose. - we discussed about taking the thyroid  hormone every day, with water, >30 minutes before breakfast, separated by >4 hours from acid reflux medications, calcium , iron, multivitamins. Pt. is taking it correctly.   3.  Multinodular goiter - no neck compression symptoms - Most recent thyroid  ultrasound showed stability of her nodules over 6 years - We will continue to follow her expectantly.  No imaging necessary for now  Lela Fendt, MD PhD [facility] Endocrinology

## 2024-09-01 ENCOUNTER — Other Ambulatory Visit: Payer: Self-pay | Admitting: Internal Medicine

## 2024-09-01 DIAGNOSIS — K2100 Gastro-esophageal reflux disease with esophagitis, without bleeding: Secondary | ICD-10-CM

## 2024-09-02 ENCOUNTER — Other Ambulatory Visit: Payer: Self-pay | Admitting: Internal Medicine

## 2024-09-02 DIAGNOSIS — E119 Type 2 diabetes mellitus without complications: Secondary | ICD-10-CM

## 2024-09-02 DIAGNOSIS — E785 Hyperlipidemia, unspecified: Secondary | ICD-10-CM

## 2024-09-03 NOTE — Telephone Encounter (Signed)
 Reached back out to patient ans she is ready to move forward with getting her cpap.  Upon patient request DME selection is [facility] Patient understands he will be contacted by [facility] to set up his cpap. Patient understands to call if [facility] does not contact him with new setup in a timely manner. Patient understands they will be called once confirmation has been received from [facility] that they have received their new machine to schedule 10 week follow up appointment.   [facility] notified of new cpap order  Please add to airview Patient was grateful for the call and thanked me

## 2024-09-06 ENCOUNTER — Other Ambulatory Visit: Payer: Self-pay | Admitting: Internal Medicine

## 2024-09-06 DIAGNOSIS — E119 Type 2 diabetes mellitus without complications: Secondary | ICD-10-CM

## 2024-10-11 ENCOUNTER — Ambulatory Visit: Admitting: Internal Medicine

## 2025-05-27 ENCOUNTER — Ambulatory Visit

## 2025-05-27 ENCOUNTER — Encounter: Admitting: Internal Medicine
